# Patient Record
Sex: Female | Born: 1940 | Race: White | Hispanic: No | Smoking: Former smoker
Health system: Southern US, Community
[De-identification: ages and names within clinical notes are randomized; demographics above are authoritative.]

## PROBLEM LIST (undated history)

## (undated) DIAGNOSIS — Z96612 Presence of left artificial shoulder joint: Secondary | ICD-10-CM

## (undated) DIAGNOSIS — M109 Gout, unspecified: Secondary | ICD-10-CM

## (undated) DIAGNOSIS — F419 Anxiety disorder, unspecified: Secondary | ICD-10-CM

## (undated) DIAGNOSIS — D649 Anemia, unspecified: Secondary | ICD-10-CM

## (undated) DIAGNOSIS — J449 Chronic obstructive pulmonary disease, unspecified: Secondary | ICD-10-CM

## (undated) DIAGNOSIS — I480 Paroxysmal atrial fibrillation: Secondary | ICD-10-CM

## (undated) DIAGNOSIS — K219 Gastro-esophageal reflux disease without esophagitis: Secondary | ICD-10-CM

## (undated) DIAGNOSIS — M1990 Unspecified osteoarthritis, unspecified site: Secondary | ICD-10-CM

## (undated) DIAGNOSIS — Z9289 Personal history of other medical treatment: Secondary | ICD-10-CM

## (undated) DIAGNOSIS — N186 End stage renal disease: Secondary | ICD-10-CM

## (undated) DIAGNOSIS — J9600 Acute respiratory failure, unspecified whether with hypoxia or hypercapnia: Secondary | ICD-10-CM

## (undated) DIAGNOSIS — I4891 Unspecified atrial fibrillation: Secondary | ICD-10-CM

## (undated) DIAGNOSIS — E119 Type 2 diabetes mellitus without complications: Secondary | ICD-10-CM

## (undated) DIAGNOSIS — Z973 Presence of spectacles and contact lenses: Secondary | ICD-10-CM

## (undated) DIAGNOSIS — Z8639 Personal history of other endocrine, nutritional and metabolic disease: Secondary | ICD-10-CM

## (undated) DIAGNOSIS — N289 Disorder of kidney and ureter, unspecified: Secondary | ICD-10-CM

## (undated) DIAGNOSIS — J189 Pneumonia, unspecified organism: Secondary | ICD-10-CM

## (undated) DIAGNOSIS — Z972 Presence of dental prosthetic device (complete) (partial): Secondary | ICD-10-CM

## (undated) DIAGNOSIS — I10 Essential (primary) hypertension: Secondary | ICD-10-CM

## (undated) DIAGNOSIS — Z22322 Carrier or suspected carrier of Methicillin resistant Staphylococcus aureus: Secondary | ICD-10-CM

## (undated) DIAGNOSIS — E785 Hyperlipidemia, unspecified: Secondary | ICD-10-CM

## (undated) DIAGNOSIS — H9190 Unspecified hearing loss, unspecified ear: Secondary | ICD-10-CM

## (undated) DIAGNOSIS — E039 Hypothyroidism, unspecified: Secondary | ICD-10-CM

## (undated) DIAGNOSIS — F329 Major depressive disorder, single episode, unspecified: Secondary | ICD-10-CM

## (undated) DIAGNOSIS — F32A Depression, unspecified: Secondary | ICD-10-CM

## (undated) HISTORY — DX: Paroxysmal atrial fibrillation: I48.0

## (undated) HISTORY — PX: TUBAL LIGATION: SHX77

## (undated) HISTORY — PX: MULTIPLE TOOTH EXTRACTIONS: SHX2053

## (undated) HISTORY — PX: DILATION AND CURETTAGE OF UTERUS: SHX78

## (undated) HISTORY — PX: PORTACATH PLACEMENT: SHX2246

## (undated) HISTORY — PX: JOINT REPLACEMENT: SHX530

## (undated) HISTORY — DX: Personal history of other endocrine, nutritional and metabolic disease: Z86.39

## (undated) HISTORY — DX: Hyperlipidemia, unspecified: E78.5

## (undated) HISTORY — PX: PARTIAL HIP ARTHROPLASTY: SHX733

## (undated) HISTORY — PX: EYE SURGERY: SHX253

## (undated) HISTORY — PX: COLONOSCOPY W/ POLYPECTOMY: SHX1380

## (undated) HISTORY — PX: SHOULDER ARTHROSCOPY: SHX128

## (undated) HISTORY — PX: CARPAL TUNNEL RELEASE: SHX101

## (undated) HISTORY — DX: End stage renal disease: N18.6

## (undated) HISTORY — PX: OTHER SURGICAL HISTORY: SHX169

## (undated) HISTORY — PX: TOTAL KNEE ARTHROPLASTY: SHX125

---
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
## 2000-05-16 ENCOUNTER — Encounter: Admission: RE | Admit: 2000-05-16 | Discharge: 2000-05-16 | Payer: Self-pay

## 2000-09-19 ENCOUNTER — Encounter: Admission: RE | Admit: 2000-09-19 | Discharge: 2000-09-19 | Payer: Self-pay | Admitting: Internal Medicine

## 2000-12-06 ENCOUNTER — Encounter: Admission: RE | Admit: 2000-12-06 | Discharge: 2000-12-06 | Payer: Self-pay | Admitting: Internal Medicine

## 2001-01-09 ENCOUNTER — Encounter: Admission: RE | Admit: 2001-01-09 | Discharge: 2001-01-09 | Payer: Self-pay | Admitting: Obstetrics & Gynecology

## 2001-01-09 ENCOUNTER — Other Ambulatory Visit: Admission: RE | Admit: 2001-01-09 | Discharge: 2001-01-09 | Payer: Self-pay | Admitting: Obstetrics & Gynecology

## 2001-05-08 ENCOUNTER — Encounter: Admission: RE | Admit: 2001-05-08 | Discharge: 2001-05-08 | Payer: Self-pay | Admitting: Internal Medicine

## 2001-09-27 ENCOUNTER — Encounter: Admission: RE | Admit: 2001-09-27 | Discharge: 2001-09-27 | Payer: Self-pay | Admitting: Internal Medicine

## 2001-10-19 ENCOUNTER — Encounter: Admission: RE | Admit: 2001-10-19 | Discharge: 2001-10-19 | Payer: Self-pay | Admitting: Internal Medicine

## 2001-11-13 ENCOUNTER — Encounter: Admission: RE | Admit: 2001-11-13 | Discharge: 2001-11-13 | Payer: Self-pay | Admitting: Internal Medicine

## 2002-02-27 ENCOUNTER — Encounter: Admission: RE | Admit: 2002-02-27 | Discharge: 2002-02-27 | Payer: Self-pay | Admitting: Internal Medicine

## 2002-06-26 ENCOUNTER — Encounter: Admission: RE | Admit: 2002-06-26 | Discharge: 2002-06-26 | Payer: Self-pay | Admitting: Internal Medicine

## 2002-07-03 ENCOUNTER — Encounter: Admission: RE | Admit: 2002-07-03 | Discharge: 2002-07-03 | Payer: Self-pay | Admitting: Internal Medicine

## 2002-07-22 ENCOUNTER — Encounter: Admission: RE | Admit: 2002-07-22 | Discharge: 2002-07-22 | Payer: Self-pay | Admitting: Internal Medicine

## 2002-12-05 ENCOUNTER — Encounter: Admission: RE | Admit: 2002-12-05 | Discharge: 2002-12-05 | Payer: Self-pay | Admitting: Internal Medicine

## 2002-12-16 ENCOUNTER — Encounter: Admission: RE | Admit: 2002-12-16 | Discharge: 2002-12-16 | Payer: Self-pay | Admitting: Internal Medicine

## 2002-12-27 ENCOUNTER — Ambulatory Visit (HOSPITAL_COMMUNITY): Admission: RE | Admit: 2002-12-27 | Discharge: 2002-12-27 | Payer: Self-pay | Admitting: Internal Medicine

## 2002-12-30 ENCOUNTER — Encounter: Admission: RE | Admit: 2002-12-30 | Discharge: 2002-12-30 | Payer: Self-pay | Admitting: Internal Medicine

## 2003-01-27 ENCOUNTER — Ambulatory Visit (HOSPITAL_COMMUNITY): Admission: RE | Admit: 2003-01-27 | Discharge: 2003-01-27 | Payer: Self-pay | Admitting: Otolaryngology

## 2003-01-27 ENCOUNTER — Encounter: Payer: Self-pay | Admitting: Otolaryngology

## 2003-01-29 ENCOUNTER — Encounter: Admission: RE | Admit: 2003-01-29 | Discharge: 2003-01-29 | Payer: Self-pay | Admitting: Internal Medicine

## 2003-08-26 ENCOUNTER — Encounter: Admission: RE | Admit: 2003-08-26 | Discharge: 2003-08-26 | Payer: Self-pay | Admitting: Internal Medicine

## 2003-09-08 ENCOUNTER — Encounter: Admission: RE | Admit: 2003-09-08 | Discharge: 2003-09-08 | Payer: Self-pay | Admitting: Internal Medicine

## 2003-09-08 ENCOUNTER — Ambulatory Visit (HOSPITAL_COMMUNITY): Admission: RE | Admit: 2003-09-08 | Discharge: 2003-09-08 | Payer: Self-pay | Admitting: Internal Medicine

## 2003-09-22 ENCOUNTER — Encounter: Admission: RE | Admit: 2003-09-22 | Discharge: 2003-09-22 | Payer: Self-pay | Admitting: Internal Medicine

## 2004-02-18 ENCOUNTER — Ambulatory Visit: Payer: Self-pay | Admitting: Family Medicine

## 2004-04-13 ENCOUNTER — Ambulatory Visit: Payer: Self-pay | Admitting: Cardiology

## 2004-04-21 ENCOUNTER — Encounter: Admission: RE | Admit: 2004-04-21 | Discharge: 2004-07-20 | Payer: Self-pay | Admitting: Cardiology

## 2004-04-28 ENCOUNTER — Ambulatory Visit: Payer: Self-pay | Admitting: Family Medicine

## 2004-05-26 ENCOUNTER — Ambulatory Visit: Payer: Self-pay | Admitting: Family Medicine

## 2004-07-21 ENCOUNTER — Ambulatory Visit: Payer: Self-pay | Admitting: Family Medicine

## 2004-10-20 ENCOUNTER — Ambulatory Visit: Payer: Self-pay | Admitting: Family Medicine

## 2004-11-10 ENCOUNTER — Ambulatory Visit: Payer: Self-pay | Admitting: Family Medicine

## 2004-11-30 ENCOUNTER — Ambulatory Visit: Payer: Self-pay | Admitting: Family Medicine

## 2004-12-29 ENCOUNTER — Encounter: Admission: RE | Admit: 2004-12-29 | Discharge: 2004-12-29 | Payer: Self-pay | Admitting: Nephrology

## 2005-01-05 ENCOUNTER — Encounter: Admission: RE | Admit: 2005-01-05 | Discharge: 2005-01-05 | Payer: Self-pay | Admitting: Nephrology

## 2005-01-12 ENCOUNTER — Ambulatory Visit: Payer: Self-pay | Admitting: Family Medicine

## 2005-01-18 ENCOUNTER — Encounter: Admission: RE | Admit: 2005-01-18 | Discharge: 2005-01-18 | Payer: Self-pay | Admitting: Family Medicine

## 2005-02-09 ENCOUNTER — Ambulatory Visit: Payer: Self-pay | Admitting: Family Medicine

## 2005-04-10 ENCOUNTER — Emergency Department (HOSPITAL_COMMUNITY): Admission: EM | Admit: 2005-04-10 | Discharge: 2005-04-10 | Payer: Self-pay | Admitting: Emergency Medicine

## 2005-04-22 ENCOUNTER — Ambulatory Visit: Payer: Self-pay | Admitting: Cardiology

## 2005-05-17 ENCOUNTER — Ambulatory Visit: Payer: Self-pay | Admitting: Family Medicine

## 2005-07-13 ENCOUNTER — Encounter: Admission: RE | Admit: 2005-07-13 | Discharge: 2005-07-13 | Payer: Self-pay | Admitting: Nephrology

## 2005-08-16 ENCOUNTER — Ambulatory Visit: Payer: Self-pay | Admitting: Family Medicine

## 2005-11-16 ENCOUNTER — Ambulatory Visit: Payer: Self-pay | Admitting: Family Medicine

## 2006-05-31 ENCOUNTER — Ambulatory Visit: Payer: Self-pay | Admitting: Family Medicine

## 2006-07-25 ENCOUNTER — Encounter: Admission: RE | Admit: 2006-07-25 | Discharge: 2006-07-25 | Payer: Self-pay | Admitting: Nephrology

## 2006-07-31 ENCOUNTER — Ambulatory Visit: Payer: Self-pay | Admitting: Family Medicine

## 2006-09-18 ENCOUNTER — Ambulatory Visit: Payer: Self-pay | Admitting: Family Medicine

## 2007-02-23 ENCOUNTER — Encounter: Admission: RE | Admit: 2007-02-23 | Discharge: 2007-02-23 | Payer: Self-pay | Admitting: Nephrology

## 2007-11-06 ENCOUNTER — Inpatient Hospital Stay (HOSPITAL_COMMUNITY): Admission: RE | Admit: 2007-11-06 | Discharge: 2007-11-09 | Payer: Self-pay | Admitting: Orthopedic Surgery

## 2008-01-08 ENCOUNTER — Encounter: Admission: RE | Admit: 2008-01-08 | Discharge: 2008-03-12 | Payer: Self-pay | Admitting: Orthopedic Surgery

## 2008-04-15 ENCOUNTER — Inpatient Hospital Stay (HOSPITAL_COMMUNITY): Admission: RE | Admit: 2008-04-15 | Discharge: 2008-04-18 | Payer: Self-pay | Admitting: Orthopedic Surgery

## 2008-05-22 ENCOUNTER — Encounter: Admission: RE | Admit: 2008-05-22 | Discharge: 2008-08-20 | Payer: Self-pay | Admitting: Orthopedic Surgery

## 2008-08-21 ENCOUNTER — Emergency Department (HOSPITAL_COMMUNITY): Admission: EM | Admit: 2008-08-21 | Discharge: 2008-08-21 | Payer: Self-pay | Admitting: Emergency Medicine

## 2010-03-05 ENCOUNTER — Inpatient Hospital Stay (HOSPITAL_COMMUNITY)
Admission: RE | Admit: 2010-03-05 | Discharge: 2010-03-07 | Payer: Self-pay | Source: Home / Self Care | Admitting: Orthopedic Surgery

## 2010-04-20 ENCOUNTER — Inpatient Hospital Stay (HOSPITAL_COMMUNITY)
Admission: EM | Admit: 2010-04-20 | Discharge: 2010-04-23 | Payer: Self-pay | Source: Home / Self Care | Attending: Orthopedic Surgery | Admitting: Orthopedic Surgery

## 2010-04-23 NOTE — H&P (Signed)
[name], ECCLESTON NO.:  [id number]  MEDICAL RECORD NO.:  [id number]           PATIENT TYPE:  LOCATION:                                 FACILITY:  PHYSICIAN:  Dahlia Bailiff, MD    DATE OF BIRTH:  11/18/1940  DATE OF ADMISSION:  04/21/2010 DATE OF DISCHARGE:                             HISTORY & PHYSICAL   ADMITTING DIAGNOSIS:  Right shoulder periprosthetic fracture.  HISTORY:  This is a very pleasant 70 year old female who 2 weeks ago underwent a reversed right total shoulder replacement by Dr. Esmond Plants.  The surgical date was March 06, 2010 and the patient was ultimately discharged to home on March 10, 2010 in no acute distress. The patient was scheduled to return to see Dr. Veverly Fells today. Unfortunately she was at home yesterday and fell.  In the fall from a standing height, there was no loss of consciousness, no blurry vision, nausea, or headaches.  The patient presented to the emergency room yesterday and was notified early this morning about the fall.  X-rays in the emergency room demonstrated periprosthetic fracture and so a CT scan was ordered and the decision was made to admit the patient for ongoing pain management.  The patient's past medical, surgical, family, social history includes hypertension, hypothyroidism, hyperlipidemia, diabetes.  She has a family history of hypertension.  Patient of Dr. Edrick Oh.  She does not smoke or use alcohol.  She is allergic to SULFA medication.  She is currently taking Robaxin, Zetia, Tylenol, Synthroid, sodium bicarbonate, Vicodin, colchicine, carvedilol, aspirin, and amlodipine.  CLINICAL EXAM:  She is alert and oriented x3.  She has complaints of significant right shoulder pain.  There was bruising and ecchymosis. The surgical incision site is clean, dry, and intact.  There is no wound dehiscence.  There is no obvious laceration, abrasion, or contusion. She has no significant neck pain.  She does  have left-sided parietal scalp hematoma but no active bleeding.  She has no shortness of breath, chest pain.  The abdomen is soft and nontender.  She is able to ambulate with assistance.  CT scan of the neck and head demonstrate a left scalp hematoma with no underlying skull fracture.  Cervical spine demonstrates no acute cervical fracture.  CT scan and x-rays of the right shoulder demonstrate a oblique fracture that __________ around the prosthesis.  There was about 1 cm of displacement approximately.  The fracture extended distally to the tip of the prosthesis about 1 cm.  There was no significant displacement of the fracture itself.  At this point in time, the patient is neurovascularly intact and has closed periprosthetic fracture.  The incision site is intact.  At this point, I have spoken with Dr. Veverly Fells, her treating surgeon.  We will continue to admit her for pain control.  He does not think revision surgery is needed.  We will keep her in sling and he will reevaluate her this evening or first thing in the morning.     Dahlia Bailiff, MD     DDB/MEDQ  D:  04/21/2010  T:  04/21/2010  Job:  [id number]  Electronically Signed by Melina Schools MD on 04/22/2010 08:47:12 PM

## 2010-04-26 LAB — CBC
HCT: 33.9 % — ABNORMAL LOW (ref 36.0–46.0)
Hemoglobin: 11 g/dL — ABNORMAL LOW (ref 12.0–15.0)
MCH: 29.6 pg (ref 26.0–34.0)
MCHC: 32.4 g/dL (ref 30.0–36.0)
MCV: 91.4 fL (ref 78.0–100.0)
Platelets: 218 10*3/uL (ref 150–400)
RBC: 3.71 MIL/uL — ABNORMAL LOW (ref 3.87–5.11)
RDW: 13.5 % (ref 11.5–15.5)
WBC: 9.3 10*3/uL (ref 4.0–10.5)

## 2010-04-26 LAB — GLUCOSE, CAPILLARY
Glucose-Capillary: 86 mg/dL (ref 70–99)
Glucose-Capillary: 102 mg/dL — ABNORMAL HIGH (ref 70–99)
Glucose-Capillary: 145 mg/dL — ABNORMAL HIGH (ref 70–99)
Glucose-Capillary: 137 mg/dL — ABNORMAL HIGH (ref 70–99)
Glucose-Capillary: 92 mg/dL (ref 70–99)
Glucose-Capillary: 107 mg/dL — ABNORMAL HIGH (ref 70–99)

## 2010-04-26 LAB — BASIC METABOLIC PANEL
BUN: 43 mg/dL — ABNORMAL HIGH (ref 6–23)
CO2: 22 mEq/L (ref 19–32)
Calcium: 9.3 mg/dL (ref 8.4–10.5)
Chloride: 112 mEq/L (ref 96–112)
Creatinine, Ser: 3.09 mg/dL — ABNORMAL HIGH (ref 0.4–1.2)
GFR calc Af Amer: 18 mL/min — ABNORMAL LOW (ref >60)
GFR calc non Af Amer: 15 mL/min — ABNORMAL LOW (ref >60)
Glucose, Bld: 101 mg/dL — ABNORMAL HIGH (ref 70–99)
Potassium: 5.1 mEq/L (ref 3.5–5.1)
Sodium: 142 mEq/L (ref 135–145)

## 2010-04-26 LAB — APTT: aPTT: 33 seconds (ref 24–37)

## 2010-04-26 LAB — DIFFERENTIAL
Basophils Absolute: 0.1 10*3/uL (ref 0.0–0.1)
Basophils Relative: 1 % (ref 0–1)
Eosinophils Absolute: 0.4 10*3/uL (ref 0.0–0.7)
Eosinophils Relative: 4 % (ref 0–5)
Lymphocytes Relative: 24 % (ref 12–46)
Lymphs Abs: 2.2 10*3/uL (ref 0.7–4.0)
Monocytes Absolute: 0.8 10*3/uL (ref 0.1–1.0)
Monocytes Relative: 8 % (ref 3–12)
Neutro Abs: 5.8 10*3/uL (ref 1.7–7.7)
Neutrophils Relative %: 63 % (ref 43–77)

## 2010-04-26 LAB — PROTIME-INR
INR: 1.03 (ref 0.00–1.49)
Prothrombin Time: 13.7 seconds (ref 11.6–15.2)

## 2010-06-15 LAB — URINALYSIS, ROUTINE W REFLEX MICROSCOPIC
Bilirubin Urine: NEGATIVE
Glucose, UA: NEGATIVE mg/dL
Ketones, ur: NEGATIVE mg/dL
Nitrite: POSITIVE — AB
Protein, ur: 30 mg/dL — AB
Specific Gravity, Urine: 1.015 (ref 1.005–1.030)
Urobilinogen, UA: 0.2 mg/dL (ref 0.0–1.0)
pH: 6.5 (ref 5.0–8.0)

## 2010-06-15 LAB — BASIC METABOLIC PANEL
BUN: 32 mg/dL — ABNORMAL HIGH (ref 6–23)
BUN: 32 mg/dL — ABNORMAL HIGH (ref 6–23)
BUN: 36 mg/dL — ABNORMAL HIGH (ref 6–23)
CO2: 20 mEq/L (ref 19–32)
CO2: 22 mEq/L (ref 19–32)
CO2: 23 mEq/L (ref 19–32)
Calcium: 8.3 mg/dL — ABNORMAL LOW (ref 8.4–10.5)
Calcium: 8.4 mg/dL (ref 8.4–10.5)
Calcium: 9.3 mg/dL (ref 8.4–10.5)
Chloride: 110 mEq/L (ref 96–112)
Chloride: 112 mEq/L (ref 96–112)
Chloride: 114 mEq/L — ABNORMAL HIGH (ref 96–112)
Creatinine, Ser: 2.72 mg/dL — ABNORMAL HIGH (ref 0.4–1.2)
Creatinine, Ser: 2.77 mg/dL — ABNORMAL HIGH (ref 0.4–1.2)
Creatinine, Ser: 3.1 mg/dL — ABNORMAL HIGH (ref 0.4–1.2)
GFR calc Af Amer: 18 mL/min — ABNORMAL LOW (ref >60)
GFR calc Af Amer: 21 mL/min — ABNORMAL LOW (ref >60)
GFR calc Af Amer: 21 mL/min — ABNORMAL LOW (ref >60)
GFR calc non Af Amer: 15 mL/min — ABNORMAL LOW (ref >60)
GFR calc non Af Amer: 17 mL/min — ABNORMAL LOW (ref >60)
GFR calc non Af Amer: 17 mL/min — ABNORMAL LOW (ref >60)
Glucose, Bld: 122 mg/dL — ABNORMAL HIGH (ref 70–99)
Glucose, Bld: 132 mg/dL — ABNORMAL HIGH (ref 70–99)
Glucose, Bld: 96 mg/dL (ref 70–99)
Potassium: 4.5 mEq/L (ref 3.5–5.1)
Potassium: 4.9 mEq/L (ref 3.5–5.1)
Potassium: 5.4 mEq/L — ABNORMAL HIGH (ref 3.5–5.1)
Sodium: 137 mEq/L (ref 135–145)
Sodium: 140 mEq/L (ref 135–145)
Sodium: 141 mEq/L (ref 135–145)

## 2010-06-15 LAB — CBC
HCT: 26.4 % — ABNORMAL LOW (ref 36.0–46.0)
HCT: 29.2 % — ABNORMAL LOW (ref 36.0–46.0)
HCT: 40.3 % (ref 36.0–46.0)
Hemoglobin: 13.1 g/dL (ref 12.0–15.0)
Hemoglobin: 8.7 g/dL — ABNORMAL LOW (ref 12.0–15.0)
Hemoglobin: 9.4 g/dL — ABNORMAL LOW (ref 12.0–15.0)
MCH: 29.9 pg (ref 26.0–34.0)
MCH: 29.9 pg (ref 26.0–34.0)
MCH: 30.5 pg (ref 26.0–34.0)
MCHC: 32.2 g/dL (ref 30.0–36.0)
MCHC: 32.5 g/dL (ref 30.0–36.0)
MCHC: 33 g/dL (ref 30.0–36.0)
MCV: 92 fL (ref 78.0–100.0)
MCV: 92.6 fL (ref 78.0–100.0)
MCV: 93 fL (ref 78.0–100.0)
Platelets: 143 10*3/uL — ABNORMAL LOW (ref 150–400)
Platelets: 154 10*3/uL (ref 150–400)
Platelets: 234 10*3/uL (ref 150–400)
RBC: 2.85 MIL/uL — ABNORMAL LOW (ref 3.87–5.11)
RBC: 3.14 MIL/uL — ABNORMAL LOW (ref 3.87–5.11)
RBC: 4.38 MIL/uL (ref 3.87–5.11)
RDW: 13.9 % (ref 11.5–15.5)
RDW: 14.1 % (ref 11.5–15.5)
RDW: 14.2 % (ref 11.5–15.5)
WBC: 6.6 10*3/uL (ref 4.0–10.5)
WBC: 7.3 10*3/uL (ref 4.0–10.5)
WBC: 7.8 10*3/uL (ref 4.0–10.5)

## 2010-06-15 LAB — TYPE AND SCREEN
ABO/RH(D): O POS
Antibody Screen: NEGATIVE

## 2010-06-15 LAB — GLUCOSE, CAPILLARY
Glucose-Capillary: 114 mg/dL — ABNORMAL HIGH (ref 70–99)
Glucose-Capillary: 125 mg/dL — ABNORMAL HIGH (ref 70–99)
Glucose-Capillary: 139 mg/dL — ABNORMAL HIGH (ref 70–99)
Glucose-Capillary: 144 mg/dL — ABNORMAL HIGH (ref 70–99)
Glucose-Capillary: 155 mg/dL — ABNORMAL HIGH (ref 70–99)
Glucose-Capillary: 156 mg/dL — ABNORMAL HIGH (ref 70–99)
Glucose-Capillary: 159 mg/dL — ABNORMAL HIGH (ref 70–99)
Glucose-Capillary: 91 mg/dL (ref 70–99)

## 2010-06-15 LAB — DIFFERENTIAL
Basophils Absolute: 0.1 10*3/uL (ref 0.0–0.1)
Basophils Relative: 1 % (ref 0–1)
Eosinophils Absolute: 0.7 10*3/uL (ref 0.0–0.7)
Eosinophils Relative: 9 % — ABNORMAL HIGH (ref 0–5)
Lymphocytes Relative: 31 % (ref 12–46)
Lymphs Abs: 2.3 10*3/uL (ref 0.7–4.0)
Monocytes Absolute: 0.4 10*3/uL (ref 0.1–1.0)
Monocytes Relative: 6 % (ref 3–12)
Neutro Abs: 3.9 10*3/uL (ref 1.7–7.7)
Neutrophils Relative %: 53 % (ref 43–77)

## 2010-06-15 LAB — URINE MICROSCOPIC-ADD ON

## 2010-06-15 LAB — APTT: aPTT: 31 seconds (ref 24–37)

## 2010-06-15 LAB — PROTIME-INR
INR: 1.07 (ref 0.00–1.49)
Prothrombin Time: 14.1 seconds (ref 11.6–15.2)

## 2010-06-15 LAB — SURGICAL PCR SCREEN
MRSA, PCR: NEGATIVE
Staphylococcus aureus: NEGATIVE

## 2010-06-15 LAB — ABO/RH: ABO/RH(D): O POS

## 2010-07-19 LAB — BASIC METABOLIC PANEL
BUN: 30 mg/dL — ABNORMAL HIGH (ref 6–23)
BUN: 36 mg/dL — ABNORMAL HIGH (ref 6–23)
BUN: 37 mg/dL — ABNORMAL HIGH (ref 6–23)
BUN: 38 mg/dL — ABNORMAL HIGH (ref 6–23)
CO2: 21 mEq/L (ref 19–32)
CO2: 21 mEq/L (ref 19–32)
CO2: 22 mEq/L (ref 19–32)
CO2: 24 mEq/L (ref 19–32)
Calcium: 8.5 mg/dL (ref 8.4–10.5)
Calcium: 8.5 mg/dL (ref 8.4–10.5)
Calcium: 9.2 mg/dL (ref 8.4–10.5)
Calcium: 9.7 mg/dL (ref 8.4–10.5)
Chloride: 109 mEq/L (ref 96–112)
Chloride: 111 mEq/L (ref 96–112)
Chloride: 111 mEq/L (ref 96–112)
Chloride: 112 mEq/L (ref 96–112)
Creatinine, Ser: 2.53 mg/dL — ABNORMAL HIGH (ref 0.4–1.2)
Creatinine, Ser: 2.54 mg/dL — ABNORMAL HIGH (ref 0.4–1.2)
Creatinine, Ser: 3 mg/dL — ABNORMAL HIGH (ref 0.4–1.2)
Creatinine, Ser: 3.15 mg/dL — ABNORMAL HIGH (ref 0.4–1.2)
GFR calc Af Amer: 18 mL/min — ABNORMAL LOW (ref >60)
GFR calc Af Amer: 19 mL/min — ABNORMAL LOW (ref >60)
GFR calc Af Amer: 23 mL/min — ABNORMAL LOW (ref >60)
GFR calc Af Amer: 23 mL/min — ABNORMAL LOW (ref >60)
GFR calc non Af Amer: 15 mL/min — ABNORMAL LOW (ref >60)
GFR calc non Af Amer: 16 mL/min — ABNORMAL LOW (ref >60)
GFR calc non Af Amer: 19 mL/min — ABNORMAL LOW (ref >60)
GFR calc non Af Amer: 19 mL/min — ABNORMAL LOW (ref >60)
Glucose, Bld: 141 mg/dL — ABNORMAL HIGH (ref 70–99)
Glucose, Bld: 142 mg/dL — ABNORMAL HIGH (ref 70–99)
Glucose, Bld: 95 mg/dL (ref 70–99)
Glucose, Bld: 98 mg/dL (ref 70–99)
Potassium: 4.4 mEq/L (ref 3.5–5.1)
Potassium: 4.6 mEq/L (ref 3.5–5.1)
Potassium: 4.9 mEq/L (ref 3.5–5.1)
Potassium: 5 mEq/L (ref 3.5–5.1)
Sodium: 139 mEq/L (ref 135–145)
Sodium: 140 mEq/L (ref 135–145)
Sodium: 140 mEq/L (ref 135–145)
Sodium: 141 mEq/L (ref 135–145)

## 2010-07-19 LAB — DIFFERENTIAL
Basophils Absolute: 0 10*3/uL (ref 0.0–0.1)
Basophils Relative: 1 % (ref 0–1)
Eosinophils Absolute: 0.5 10*3/uL (ref 0.0–0.7)
Eosinophils Relative: 7 % — ABNORMAL HIGH (ref 0–5)
Lymphocytes Relative: 23 % (ref 12–46)
Lymphs Abs: 1.8 10*3/uL (ref 0.7–4.0)
Monocytes Absolute: 0.5 10*3/uL (ref 0.1–1.0)
Monocytes Relative: 7 % (ref 3–12)
Neutro Abs: 4.7 10*3/uL (ref 1.7–7.7)
Neutrophils Relative %: 63 % (ref 43–77)

## 2010-07-19 LAB — GLUCOSE, CAPILLARY
Glucose-Capillary: 109 mg/dL — ABNORMAL HIGH (ref 70–99)
Glucose-Capillary: 117 mg/dL — ABNORMAL HIGH (ref 70–99)

## 2010-07-19 LAB — TYPE AND SCREEN
ABO/RH(D): O POS
Antibody Screen: NEGATIVE

## 2010-07-19 LAB — CBC
HCT: 27.3 % — ABNORMAL LOW (ref 36.0–46.0)
HCT: 33.2 % — ABNORMAL LOW (ref 36.0–46.0)
HCT: 38.3 % (ref 36.0–46.0)
Hemoglobin: 11.1 g/dL — ABNORMAL LOW (ref 12.0–15.0)
Hemoglobin: 12.6 g/dL (ref 12.0–15.0)
Hemoglobin: 9.2 g/dL — ABNORMAL LOW (ref 12.0–15.0)
MCHC: 33 g/dL (ref 30.0–36.0)
MCHC: 33.4 g/dL (ref 30.0–36.0)
MCHC: 33.5 g/dL (ref 30.0–36.0)
MCV: 91.9 fL (ref 78.0–100.0)
MCV: 93.4 fL (ref 78.0–100.0)
MCV: 93.4 fL (ref 78.0–100.0)
Platelets: 169 10*3/uL (ref 150–400)
Platelets: 193 10*3/uL (ref 150–400)
Platelets: 246 10*3/uL (ref 150–400)
RBC: 2.92 MIL/uL — ABNORMAL LOW (ref 3.87–5.11)
RBC: 3.62 MIL/uL — ABNORMAL LOW (ref 3.87–5.11)
RBC: 4.1 MIL/uL (ref 3.87–5.11)
RDW: 15.4 % (ref 11.5–15.5)
RDW: 15.5 % (ref 11.5–15.5)
RDW: 16 % — ABNORMAL HIGH (ref 11.5–15.5)
WBC: 10 10*3/uL (ref 4.0–10.5)
WBC: 7.5 10*3/uL (ref 4.0–10.5)
WBC: 9.5 10*3/uL (ref 4.0–10.5)

## 2010-07-19 LAB — URINALYSIS, ROUTINE W REFLEX MICROSCOPIC
Bilirubin Urine: NEGATIVE
Glucose, UA: NEGATIVE mg/dL
Ketones, ur: NEGATIVE mg/dL
Nitrite: POSITIVE — AB
Protein, ur: 30 mg/dL — AB
Specific Gravity, Urine: 1.016 (ref 1.005–1.030)
Urobilinogen, UA: 0.2 mg/dL (ref 0.0–1.0)
pH: 5.5 (ref 5.0–8.0)

## 2010-07-19 LAB — PREPARE RBC (CROSSMATCH)

## 2010-07-19 LAB — URINE MICROSCOPIC-ADD ON

## 2010-07-19 LAB — APTT: aPTT: 26 seconds (ref 24–37)

## 2010-07-19 LAB — PROTIME-INR
INR: 0.9 (ref 0.00–1.49)
Prothrombin Time: 12.6 seconds (ref 11.6–15.2)

## 2010-08-13 NOTE — Discharge Summary (Signed)
  [name], [name] NO.:  [id number]  MEDICAL RECORD NO.:  [id number]          PATIENT TYPE:  INP  LOCATION:  5018                         FACILITY:  [facility]  PHYSICIAN:  Doran Heater. Veverly Fells, M.D. DATE OF BIRTH:  1940/12/24  DATE OF ADMISSION:  04/20/2010 DATE OF DISCHARGE:  04/23/2010                              DISCHARGE SUMMARY   ADMITTING DIAGNOSIS:  Right periprosthetic humerus fracture as well as head contusion.  POSTOPERATIVE DIAGNOSIS:  Right periprosthetic humerus fracture as well as head contusion.  CONSULTING SERVICES:  Physical Therapy, Occupational Therapy and discharge planning.  HISTORY OF PRESENT ILLNESS:  The patient is a 70 year old female who is status post right reverse total shoulder arthroplasty.  The patient is approximately 6-8 weeks postop when she suffered a fall, striking her head and injuring her right arm.  The patient suffered a minimally displaced periprosthetic right proximal humerus fracture and is admitted for further workup and care for her contused head and also for right shoulder.  For further details of the patient's past medical history and physical examination, please see the hospital record.  HOSPITAL COURSE:  The patient admitted by Orthopedics by Dr. Melina Schools on April 21, 2010, with a diagnosis of a head contusion as well as a right proximal humerus periprosthetic fracture.  The patient had a CT scan performed of the head and the C-spine there was no acute fracture.  She had a proximal humerus fracture by CT.  This was verified.  There was no prosthetic loosening and it was felt that this was a stable injury.  The patient had physical therapy and occupational therapy.  She will be getting home health physical therapy with sister. She did have some itching with Percocet.  We switched her over to Dilaudid and Norco.  The patient was discharged on April 23, 2010, in an improved condition with a regular diet,  her preadmission medications as well as Dilaudid and Norco and Robaxin.  She will be kept in an arm sling with no exercises for the shoulder just gentle mobilization and I will be seeing her back in 1 week in the office for x-rays.     Doran Heater. Veverly Fells, M.D.     SRN/MEDQ  D:  07/28/2010  T:  07/29/2010  Job:  [id number]  Electronically Signed by Esmond Plants  on 08/13/2010 04:27:53 PM

## 2010-08-17 NOTE — H&P (Signed)
NAMEMARJA, Roman NO.:  [id number]   MEDICAL RECORD NO.:  [id number]        PATIENT TYPE:  LINP   LOCATION:                               FACILITY:  [facility]   PHYSICIAN:  Pietro Cassis. Alvan Dame, M.D.  DATE OF BIRTH:  1940-11-10   DATE OF ADMISSION:  11/06/2007  DATE OF DISCHARGE:                              HISTORY & PHYSICAL   PROCEDURE:  Left total knee arthroplasty.   CHIEF COMPLAINT:  Left knee pain.   HISTORY OF PRESENT ILLNESS:  A 70 year old female with a history of left  knee pain secondary to osteoarthritis.  It has been refractory to all  conservative treatment including oral antiinflammatories, cortisone  injection and viscosupplementation.  She has pain with all activities,  pain at night and difficulty sleeping.  Pre-surgically assessed by  [name], M.D. who is her primary care physician.   PAST MEDICAL HISTORY:  Significant for:  1. Osteoarthritis.  2. Gout.  3. Diabetes.  4. History of renal insufficiency.   PAST SURGICAL HISTORY:  None.   FAMILY HISTORY:  Heart disease.   SOCIAL HISTORY:  Married.  Primary caregiver will be family in the home  including her sister, Hildred Priest.   DRUG ALLERGIES:  SULFA DRUGS.   MEDICATIONS:  1. Amlodipine 10 mg p.o. daily.  2. Colchicine 0.6 mg p.r.n.  3. Zetia 10 mg p.o. daily.  4. Synthroid 100 mcg p.o. daily.  5. Aspirin 81 mg p.o. daily.  6. Sodium bicarbonate 650 mg p.o. daily.  7. Carvedilol 6.25 mg p.o. daily.   Please verify all medicines, dosage and frequency with the patient at  the time of admission.   REVIEW OF SYSTEMS:  GENITOURINARY:  She has increased urination at  night. MUSCULOSKELETAL: Multiple joint pains.  Otherwise see HPI.   PHYSICAL EXAMINATION:  Pulse 64, respirations 16, blood pressure 140/98,  5 feet, 5, 225 pounds.  GENERAL:  Awake, alert and oriented, well-developed, well-nourished.  NECK:  Supple.  No carotid bruits.  CHEST:  Lungs are clear to  auscultation bilaterally.  BREASTS:  Deferred.  HEART:  Regular rate and rhythm.  S1 and S2 distinct.  ABDOMEN:  Soft, nontender, and nondistended.  Bowel sounds present.  GENITOURINARY:  Deferred.  EXTREMITIES:  Left knee:  0-100 degrees.  Dorsalis pedis pulse positive.  SKIN:  Intact.  No cellulitis.  NEUROLOGIC:  Intact.  Distal sensibilities.   LABORATORY DATA:  EKG and chest x-ray are pending procedural testing.   IMPRESSION:  Left knee osteoarthritis.   PLAN OF ACTION:  1. Left total knee arthroplasty at [facility], November 06, 2007 by surgeon Pietro Cassis. Alvan Dame, M.D.  The risks and complications      were discussed.  2. She will need careful management with fluids do to her chronic      renal insufficiency.  3. The planned disposition is going to be skilled nursing facility      rehab.  The patient has already scouted out for some facilities and      pending bed availability  will provide a name at time of admission.     ______________________________  Carlean Jews Collene Mares, [place]      Pietro Cassis. Alvan Dame, M.D.  Electronically Signed    BLM/MEDQ  D:  10/25/2007  T:  10/25/2007  Job:  [id number]   cc:   [name], M.D.  Fax: [phone]

## 2010-08-17 NOTE — H&P (Signed)
[name], [name] NO.:  [id number]   MEDICAL RECORD NO.:  [id number]          PATIENT TYPE:  INP   LOCATION:                               FACILITY:  [facility]   PHYSICIAN:  Pietro Cassis. Alvan Dame, M.D.  DATE OF BIRTH:  1940-12-29   DATE OF ADMISSION:  04/15/2008  DATE OF DISCHARGE:                              HISTORY & PHYSICAL   DATE OF ADMISSION:  April 15, 2008.   ATTENDING PHYSICIAN:  Pietro Cassis. Alvan Dame, M.D.   PROCEDURE:  Right total knee replacement.   CHIEF COMPLAINT:  Right knee pain.   HISTORY OF PRESENT ILLNESS:  [age]-year-old female with a history  of right knee pain secondary to osteoarthritis.  It has been refractory  to all conservative treatment.  She does have a history of recent left  total knee replacement back in August of 2009 and has done well with  this.  She has been pre-surgically assessed by her physician, [name], M.D.   PRIMARY CARE PHYSICIAN:  [name], M.D.   PAST MEDICAL HISTORY:  1. Osteoarthritis.  2. Gout.  3. Diabetes.  4. History of renal insufficiency.   PAST SURGICAL HISTORY:  Left total knee replacement August 2009.   FAMILY HISTORY:  Heart disease.   SOCIAL HISTORY:  Married.  She is planning on a skilled nursing facility  stay postoperatively, [facility] in [place].   DRUG ALLERGIES:  SULFA DRUGS.   MEDICATIONS:  1. Amlodipine 5 mg daily.  2. Colchicine 0.6 mg daily p.r.n.  3. Zetia 10 mg daily.  4. Synthroid 100 mcg  p.o. daily.  5. Aspirin 81 mg daily.  6. Sodium bicarb 650 mg p.o. daily.  7. Carvedilol 6.25 mg p.o. daily.  8. Vitamin D monthly.  9. Darvocet N 100 one to two every 8 p.r.n. pain.   REVIEW OF SYSTEMS:  GENITOURINARY:  Increased urination at night.  MUSCULOSKELETAL:  Multiple joint pains.  Otherwise see HPI.   PHYSICAL EXAMINATION:  Pulse 72, respirations 16, blood pressure 158/90.  GENERAL:  Awake, alert and oriented, uses a cane.  NECK:  Supple.  No  carotid bruits.  CHEST:  Lungs clear to auscultation bilaterally.  BREASTS:  Deferred.  HEART:  Regular rate and rhythm.  S1, S2 distinct.  ABDOMEN:  Obese.  Bowel sounds present.  PELVIC:  Stable.  GENITOURINARY:  Deferred.  EXTREMITIES:  Right knee range of motion 0-95 with diffuse anterior  tenderness.  SKIN:  No cellulitis of her right lower extremity.  NEUROLOGIC:  Intact distal sensibilities.   LABORATORY DATA:  Labs, EKG, chest x-ray pending.   IMPRESSION:  Right knee osteoarthritis.   PLAN OF ACTION:  Right total knee replacement on April 15, 2008 at  [facility] by surgeon Dr. Paralee Cancel.  Risks and  complications were discussed.  She is planning on postoperative  rehabilitation stay at [facility] in [place].     ______________________________  Carlean Jews Collene Mares, [place]      Pietro Cassis. Alvan Dame, M.D.  Electronically Signed    BLM/MEDQ  D:  04/09/2008  T:  04/09/2008  Job:  [id number]   cc:   [name], M.D.  Fax: [phone]

## 2010-08-17 NOTE — Op Note (Signed)
NAMEMAKAI, CAPPA NO.:  [id number]   MEDICAL RECORD NO.:  [id number]          PATIENT TYPE:  INP   LOCATION:  0008                         FACILITY:  [facility]   PHYSICIAN:  Pietro Cassis. Alvan Dame, M.D.  DATE OF BIRTH:  [birth year]   DATE OF PROCEDURE:  11/06/2007  DATE OF DISCHARGE:                               OPERATIVE REPORT   PREOPERATIVE DIAGNOSIS:  Left knee osteoarthritis with significant  valgus deformity but correctable passively with no flexion deformity.   POSTOPERATIVE DIAGNOSIS:  Left knee osteoarthritis with significant  valgus deformity but correctable passively with no flexion deformity.   PROCEDURE:  Left total knee replacement utilizing DePuy rotating  platform, posterior stabilized knee system, size 2 femur, 2.5  tibia and  a 15-mm insert, 35 patellar button.   SURGEON:  Pietro Cassis. Alvan Dame, M.D.   ASSISTANT:  Carlean Jews. Collene Mares, PA   ANESTHESIA:  Duramorph spinal.   DRAINS:  One.   COMPLICATION:  None.   TOURNIQUET TIME:  45 minutes at 250 mmHg.   INDICATIONS FOR PROCEDURE:  Ms. Spelman is a 70 year old female who  presented to the office for bilateral knee pain left greater than right.  Radiographs revealed end-stage changes bilaterally. Failing conservative  measures, she wished to proceed with arthroplasty, reviewed the risks  and benefits of the procedures including infection, DVT, component  failure was well as postop course expectations, pain relief as well as  the need for range of motion and physical therapy for strength.   Consent was obtained.   PROCEDURE IN DETAIL:  The patient was brought to the operating theater.  Once adequate anesthesia and preoperative antibiotics administered, the  patient was positioned supine and a tourniquet placed.  The left lower  extremity pre scrubbed and prepped and draped in sterile fashion.  The  midline incision was made followed by median arthrotomy as I was able to  passivley correct her knee and I  did not feel like I needed a lateral  exposure.  Following routine exposure, attention was directed to the  patella.  Precut measure was 22 mm.  I resected down to 13 to 14 mm  using a 35 patellar button.  Restoring the patella height, attention was  now directed to the femur.  She was noted to have significant lateral  osteophytes and moderate to severe medial osteophytes.   I had to debride some of these in order to subluxate the patella  laterally.  A starting drill opened up the femoral canal.  I irrigated  it to prevent fat emboli.  I then placed an intramedullary rod in 5  degrees of valgus, resected 10 mm of bone off the distal femur which did  amount followup a couple mm cut laterally.  No augments necessary.  The  tibia was now subluxated anteriorly and noted severe degenerative  changes laterally.  I chose this point with the extramedullary guide to  resect 2 mm of bone off the lateral side, the affected side.  Following  this resection I checked with a 2.5 tibial tray that the cut was  perpendicular and it was.  Given this, I set my rotation for the femoral  cutting block based off the cut surface the tibia.  Once this was sized  and pinned, a size 2 cutting block was positioned, I checked with a crab  claw to make sure there was no notching.  Anterior, posterior and  chamfer cuts were then made without difficulty or complication.  Note  that the rotation was set by the line perpendicular to [employer].  Final box cut was made based off the lateral aspect of distal femur.  Further osteophytes debrided as necessary further releasing and  debriding and releasing the lateral collateral ligament from stress.   At this point the tibia was subluxated anterior.  The final preparation  of the tibia was carried out with a 2.5 tibial tray.  It was pinned into  position.  The medial third of the tubercle was drilled and keel  punched.  Trial reduction was now carried out.  With a  trial reduction I  basically found that a size 15 insert was best to eliminate any  hyperextension and provide stability for extension and flexion.  The  range of motion was very well tolerated otherwise.  The patella did  track without any application of pressure symmetrically through the  trochlea.   At this point all trial components removed.  I did use laminar spreaders  and debrided the posterior aspect of the knee of any bony debris and  osteophytes.  I injected the synovial capsule layer was 60 mL of 0.25%  Marcaine with epinephrine 1 mL of Toradol.  I gently cleaned out the  knee with normal saline solution pulse lavage.   The final components were opened and cement mixed.  The final components  were cemented in position.  The knee was brought to extension with 12.5  insert.  The extruded cement was removed.  Once cement cured, excessive  cement was removed throughout the knee.  Once I was satisfied that there  was no remaining cement, the final I chose a 15 insert due to stability.  Final 15 insert was placed.  The knee was reirrigated.  The medium  Hemovac drain was placed deep.  The tourniquet was let down at 45  minutes.  I then reapproximated extensor mechanism with #1 Vicryl with  the knee in flexion.  The remaining wound was closed with 2-0 Vicryl and  running 4-0 Monocryl.  The knee was cleaned, dried, dressed sterilely  with sterile bulky Jones dressing.  She was brought to the recovery room  tolerating the procedure well.      Pietro Cassis Alvan Dame, M.D.  Electronically Signed     MDO/MEDQ  D:  11/06/2007  T:  11/06/2007  Job:  [id number]

## 2010-08-17 NOTE — Discharge Summary (Signed)
[name], GREGOREK NO.:  [id number]   MEDICAL RECORD NO.:  [id number]          PATIENT TYPE:  INP   LOCATION:  [place]                         FACILITY[facility]  [facility]   PHYSICIAN:  Pietro Cassis. Alvan Dame, M.D.  DATE OF BIRTH:  [birth year]   DATE OF ADMISSION:  04/15/2008  DATE OF DISCHARGE:  04/18/2008                               DISCHARGE SUMMARY   ADMISSION DIAGNOSES:  1. Osteoarthritis.  2. Gout.  3. Diabetes.  4. History of renal insufficiency.   DISCHARGE DIAGNOSES:  1. Osteoarthritis.  2. Gout.  3. Diabetes.  4. History of renal insufficiency.  5. Postoperative anemia, resolved, with transfusion.  6. Osteopenia.   HISTORY OF PRESENT ILLNESS:  [name] is a 70 year old female with a  history of right knee osteoarthritis, who underwent a right total knee  replacement in the hospital.   CONSULTS:  None.   PROCEDURE:  Right total knee replacement by surgeon, Dr. Paralee Cancel.  Assistant:  Rowan Blase, PA-C.   LABORATORY DATA:  CBC, final reading:  White blood cells 10, hemoglobin  11.1, hematocrit 33.2, platelets 169.  Metabolic panel:  Sodium [id number],  potassium 5, BUN 38, creatinine 3, glucose 142.   RADIOLOGY:  Chest, two view, no active disease.   Portable right knee shows right total knee arthroplasty with two Marella Chimes-  Miles wires circumferentially supporting a distal femur fracture.   CARDIOLOGY:  EKG:  Normal sinus rhythm.   HOSPITAL COURSE:  The patient underwent a right total knee replacement  by surgeon, Dr. Paralee Cancel, and assistant, Rowan Blase.  During surgery,  there was a distal femur fracture that was vertically transverse through  the mid femur.  It was reduced with Dall-Miles cables circumferentially  around the femur.  Therefore, she was put on partial weightbearing 25-  50% with gentle range-of-motion exercise of her knee.  She was  transfused 2 units of blood.  She did have some anemia as well as some  chronic renal insufficiency.  Her GFR was  only 19.  Her dressing was  changed on a daily basis with no significant drainage from the wound.  She made limited progress with physical therapy due to increased pain as  well as limited weightbearing status.   When seen on day 3, there had been no significant events and no  complaints.  Wound had no drainage.  She was ready for discharge to a  skilled nursing facility rehab.   DISCHARGE DISPOSITION:  Discharge to skilled nursing facility rehab in  stable and improved condition.   DISCHARGE PHYSICAL THERAPY:  She is partial weightbearing 25-50% with  the use of a rolling walker.  Want to work on gentle range-of-motion  exercises, nothing that is too stressful at this point.  She cannot  perform straight leg raises in the bed as well as some gentle isometric  exercises.  We do, though, want to work on some range-of-motion  exercises, but nothing that is too stressful.   DISCHARGE DIET:  Heart-healthy.   DISCHARGE WOUND CARE:  Keep dry.   DISCHARGE MEDICATIONS:  1.  Lovenox 40 mg subcu q.24h. x10 days, then started enteric-coated      aspirin 325 mg 1 p.o. daily.  2. Robaxin 500 mg 1 p.o. q.6h.  3. Colace 100 mg 1 p.o. b.i.d. p.r.n. constipation while on narcotics.  4. MiraLax 17 gm 1 p.o. daily p.r.n. constipation while on narcotics.  5. Norco 7.5 mg/325 1-2 p.o. q.4-6h. p.r.n. pain.  6. Amlodipine 5 mg q.a.m.  7. Colchicine 0.6 mg p.o. q.a.m.  8. Zetia 10 mg 1 p.o. q.a.m.  9. Synthroid 100 mcg 1 p.o. q.a.m.  10.Aspirin 81 mg.  Hold.  11.Sodium bicarbonate 650 mg 1 p.o. q.a.m. and 1 p.o. q.p.m.  12.Carvedilol 6.25 mg 1 p.o. q.a.m. and 1 p.o. q.p.m.  13.Vitamin D 50,000 IU monthly.   DISCHARGE FOLLOWUP:  Follow up with Dr. Alvan Dame in 2 weeks for a wound  check.  His phone number is [phone].  All questions directed to Dr.  Alvan Dame.   If there are any questions as far as her renal insufficiency, please  follow up with her primary care physician, Dr. Dione Housekeeper.      ______________________________  Carlean Jews. Collene Mares, [place]      Pietro Cassis. Alvan Dame, M.D.  Electronically Signed    BLM/MEDQ  D:  04/18/2008  T:  04/18/2008  Job:  [id number]   cc:   [name], M.D.  Fax: [phone]

## 2010-08-17 NOTE — Discharge Summary (Signed)
[name], SNIFF NO.:  [id number]   MEDICAL RECORD NO.:  [id number]          PATIENT TYPE:  INP   LOCATION:  [place]                         FACILITY:  [facility]   PHYSICIAN:  Pietro Cassis. Alvan Dame, M.D.  DATE OF BIRTH:  [birth year]   DATE OF ADMISSION:  11/06/2007  DATE OF DISCHARGE:  11/09/2007                               DISCHARGE SUMMARY   ADMISSION DIAGNOSES:  1. Osteoarthritis.  2. Gout.  3. Diabetes.  4. History of renal insufficiency.   DISCHARGE DIAGNOSES:  1. Osteoarthritis.  2. Gout.  3. Diabetes.  4. History of renal insufficiency.   HISTORY OF PRESENT ILLNESS:  A 70 year old female with history of left  knee pain secondary to osteoarthritis.  It was refractory to all  conservative treatment.   CONSULTATION:  None.   PROCEDURE:  Left total knee arthroplasty by surgeon, Dr. Paralee Cancel.  Assistant, Rowan Blase PA-C.   LABORATORY DATA:  CBC upon admission hemoglobin 12.4, hematocrit 36.9,  platelets 225.  Upon discharge hemoglobin 8.2, hematocrit 24.1,  platelets 152 and stable.  Chemistries with a sodium 143, potassium 4.7,  creatinine was 2.76 and glucose was 92 prior admission, tracked  throughout her course of stay.  At time of discharge, her sodium was  138, potassium 4.4, creatinine to 3.25 and glucose 139.  UA was negative  for nitrites.  Her coagulation was normal with INR of 1.   RADIOLOGY:  Chest two-view no acute disease.   CARDIOLOGY:  Normal sinus rhythm.   HOSPITAL COURSE:  The patient admitted to hospital and underwent a left  total knee replaced, tolerated procedure well.  She made minimal  progress during her course of stay, requiring assistance with ambulation  with the use rolling walker.  Otherwise, she did well with no  significant events.  She did have a mild fever during her course of stay  but it resolved.  Hemodynamically, she remained stable.  Dressing was  changed on a daily basis with no significant serosanguineous  ooze.  Her  wound looked great, no sign of infection.  She was neurovascularly  intact with left lower extremity throughout.  She had good quad function  with increasing strength on a daily basis.  She was weightbearing as  tolerated with use rolling walker making progress during her course of  stay.  She was seen on day #3, she was stable and ready for discharge to  a nursing facility rehab for further progress.   DISCHARGE DISPOSITION:  Discharged to skilled nurse facility rehab,  stable, in improved condition.   DISCHARGE PHYSICAL THERAPY:  Weightbearing as tolerated with use of  rolling walker.   DISCHARGE DIET:  Regular.   DISCHARGE WOUND CARE:  Keep dry.   DISCHARGE MEDICATIONS:  1. Lovenox 40 mg subcu q.24 h x11 days.  2. Robaxin 500 mg p.o. daily.  3. Enteric-coated aspirin 325 mg one p.o. daily x4 weeks after Lovenox      completed.  4. Iron 325 mg one p.o. t.i.d. x3 weeks.  5. Colace 100 mg p.o. b.i.d. while on narcotics.  6. MiraLax 17 grams p.o. daily while on narcotics.  7. Vicodin 5/325 one to two p.o. q.4-6 h p.r.n. pain.  8. Sodium bicarbonate 650 mg 1 p.o. q.a.m. 1 p.o. q.p.m.  9. Zetia 10 mg p.o. q.a.m.  10.She is on aspirin 81 mg p.o. q.a.m.  11.Synthroid 100 mcg 1 p.o. q.a.m.  12.Coreg 6.25 mg 1 p.o. q.a.m. and 1 p.o. q.p.m.  13.Amlodipine 10 mg 1 p.o. q.a.m., hold if systolic under [id number].  [id number] 0.6 mg 1 p.o. q.a.m.   DISCHARGE FOLLOWUP:  Follow up with Dr. Alvan Dame at phone number [phone] in  2 weeks for wound check.     ______________________________  Carlean Jews. Collene Mares, [place]      Pietro Cassis. Alvan Dame, M.D.  Electronically Signed    BLM/MEDQ  D:  11/09/2007  T:  11/09/2007  Job:  [id number]

## 2010-08-17 NOTE — Op Note (Signed)
NAMEJOREY, WILSEY NO.:  [id number]   MEDICAL RECORD NO.:  [id number]          PATIENT TYPE:  INP   LOCATION:  0004                         FACILITY:  [facility]   PHYSICIAN:  Pietro Cassis. Alvan Dame, M.D.  DATE OF BIRTH:  March 06, 1941   DATE OF PROCEDURE:  04/15/2008  DATE OF DISCHARGE:                               OPERATIVE REPORT   PREOPERATIVE DIAGNOSES:  1. Right knee osteoarthritis.  2. History of left total knee replacement.   POSTOPERATIVE DIAGNOSES:  1. Right knee osteoarthritis.  2. History of left total knee replacement.   PROCEDURES:  1. Right total knee replacement.  2. Open reduction, internal fixation with two Dall-Miles cables in the      distal femur fracture.   COMPONENTS USED:  DePuy rotating platform posterior stabilized knee  system with a size 2.5 femur, 2.5 tibia, 12.5 insert and a 35 patellar  button.   SURGEON:  Pietro Cassis. Alvan Dame, M.D.   ASSISTANT:  Carlean Jews. Mann, PA.   ANESTHESIA:  Spinal.   SPECIMEN:  None.   FINDINGS:  None.   DRAINS:  One Hemovac.   TOURNIQUET TIME:  53 minutes at 250 mmHg.   COMPLICATIONS:  There was a nondisplaced fracture of the distal femur  upon my retraction or subluxation of the tibia anteriorly.  At the time  that this was identified, the Dall-Miles cable system was opened and two  cables were placed, providing secure fixation to the fracture.   Though the fracture was identified, the operative team was very  efficient in providing [place] with the equipment needed to perform this  portion of the procedure and the tourniquet time was thus limited  probably by only 10 minutes.   INDICATIONS FOR PROCEDURE:  Ms. Aria is a 70 year old female known to me  from previous left total knee replacement in August 2009.  She had done  well with this, but had initially presented with bilateral knee  osteoarthritis.  Her right knee continued to be a major problem for her.  She was failing conservative measures.  She  wished to discuss surgical  options again.  The risks and benefits of knee replacement surgery were  discussed, including infection, DVT, component failure, need for  revision surgery, including the need for manipulation.  The postop  course and expectations were reviewed based on what she had been through  before.  Consent was obtained for the benefit of pain relief.   PROCEDURE IN DETAIL:  The patient was brought to the operative theater.  Once adequate anesthesia, preoperative antibiotics, Ancef administered,  the patient was positioned supine with a thigh tourniquet in place.  The  right lower extremity was pre-scrubbed and prepped and draped in a  sterile fashion.  A timeout was performed, identifying the patient and  extremity.   The leg was exsanguinated, tourniquet elevated to 250 mmHg.   A midline incision was made followed by median parapatellar arthrotomy.  Following initial debridement, attention was directed to the patella.  Precut measurement was approximately 19 mm.  I resected down to 13-14 mm  and chose to use the 35 patellar button which was used on the other  side.  It covered the cut surface of the patella well.   A metal shim was placed on the patella to protect it from the retractors  and saw blades.   Attention was now directed to the femur.  The femoral canal was noted to  have significant osteophytes covering the notch.  I used the three-  eighth inch osteotome at this point to debride osteophytes.  I then  removed the cruciate stumps.  Following this, I used a drill to open up  the femoral canal.  The femoral canal was opened and irrigated to  prevent fat emboli.  An intramedullary rod was then passed and at 3  degrees of valgus, I resected 10 mm of bone off the distal femur.   Following this cut, I attended to the tibia.   Noting her valgus tendency to her knee deformity, I limited my exposure  of the medial side.  There was no significant proximal  peel.   In an effort to try to subluxate the tibia which I felt was without any  excessive amount of force, I was using a Crego retractor.  The Crego  retractor was placed in the notch and I myself was applying the  pressure, not my assistant.  I felt a bit of a give.  It was at this  point that I identified a nondisplaced fracture of the distal femur.  The extent of the fracture was identified through dissection.  At this  point, I asked the circulating nurse to get the Dall-Miles cable system.   Once this fracture was identified and while I waiting for the system, I  went ahead and opened up the King-Kong clamp and placed it on distal  condyles to hold it in place.   The fracture remained nondisplaced throughout the entire time.   While this was being done, we attended to the tibia.  I was able to  gently subluxate the tibia anteriorly enough to get adequate exposure  placing medial and lateral retractors.  I then placed an extramedullary  guide and resected 8 mm of bone based off the lateral proximal tibia.   Following this resection, I checked with an extension block and was  unable to get the size 10 in adequately, so I removed 2 mm of bone by  reapplying the extramedullary guide en bloc.   I checked the cut surface to make sure it was perpendicular and it was.   At this point following this tibial cut, we had, had the Dall-Miles  cable system opened.  I had already dissected the distal femur and noted  the extent of the fracture.  Two Dall-Miles cables were then placed with  the cable passer and  tensioned  down.  The cables were cut.  The  fracture was anatomically reduced and maintained.   King-Kong clamp was removed and not utilized again.   At this point, we attended to the femur.  Femoral rotation was based off  of the proximal cut of the tibia once I had confirmed that it was  perpendicular.   I sized the femur and it was found to be between a size 2.5-3.  I chose   to use a 2.5 block based on the fact of the contralateral knee was a  size 2 and it appeared adequately sized.  The rotation block was pinned  into position with a 4:1 cutting block with  exchange.  The anterior cut  was made without notching or complications.  The posterior condyle cut  was made, followed by the chamfer cuts.   The final box cut was made off the lateral aspect of distal femur for  the size 2.5 femur.   At this point, I did place the femoral trial onto the femur and kept it  there to apply retraction against so there would be no further  complications of the femur fracture.  With this, I was able to subluxate  the tibia anteriorly enough.  A 2.5 tibial tray fit nicely on the cut  surface.  It was pinned into position through the medial third of the  tubercle.  I then drilled and keel punched the tibia to perform a trial  reduction.   With the 10-mm insert, we were noted to have a little bit of  hyperextension.  With this, we trialed with the 12.5 and the knee came  out to full extension and had good tightness in flexion.   At this point, all trial components were removed.  We irrigated the knee  with normal saline solution, prepared the synovial capsule junction with  60 mL of quarter-percent Marcaine with epinephrine and 1 mL of Toradol.   The cement was mixed as the final components were opened.  The final  components were then cemented into position.  Again, when I cemented the  tibial component in place I did place the trial femur onto the distal  femur to allow for the anterior displacement of the tibia without  contouring the femur.  Again, there was no complicating features on the  femur.   Following this, the femur was cemented in position.  I used the 12.5  insert with the knee in extension.  Extruded cement was removed.  Once  cement had cured and excessive cement was removed throughout the knee,  we irrigated the knee again and placed the final 12.5  insert.  The knee  was reduced.  We irrigated with normal saline solution with the pulse  lavage.  A medium Hemovac drain was placed deep.  The extensor mechanism  was then reapproximated using #1 Vicryl with the knee in flexion.  The  remaining wound was closed in layers with 3-0 Vicryl and 4-0 running  Monocryl.  The knee was cleaned, dried and dressed sterilely with a  sterile bulky Jones dressing.  She was brought to the recovery room with  a knee immobilizer in place, tolerating the procedure well.   At the time of dictation, I had already spoken to the family regarding  the case and the intraoperative complication.  I explained that this  would probably  delay a little bit in her overall recovery in terms of  weightbearing status and range of motion, but long-term I do not think  it will have long-term effect.  Questions were encouraged regarding  this.  I will follow up with her in the morning so she will understand  what happened.     Pietro Cassis Alvan Dame, M.D.  Electronically Signed    MDO/MEDQ  D:  04/15/2008  T:  04/15/2008  Job:  [id number]

## 2010-09-27 ENCOUNTER — Other Ambulatory Visit: Payer: Self-pay | Admitting: Orthopedic Surgery

## 2010-09-27 DIAGNOSIS — M79601 Pain in right arm: Secondary | ICD-10-CM

## 2010-09-27 DIAGNOSIS — M25511 Pain in right shoulder: Secondary | ICD-10-CM

## 2010-09-30 ENCOUNTER — Other Ambulatory Visit: Payer: Self-pay

## 2010-10-04 ENCOUNTER — Other Ambulatory Visit: Payer: Self-pay

## 2010-10-04 ENCOUNTER — Ambulatory Visit
Admission: RE | Admit: 2010-10-04 | Discharge: 2010-10-04 | Disposition: A | Discharge status: Home / Self Care | Payer: PRIVATE HEALTH INSURANCE | Source: Ambulatory Visit | Attending: Orthopedic Surgery | Admitting: Orthopedic Surgery

## 2010-10-04 DIAGNOSIS — M25511 Pain in right shoulder: Secondary | ICD-10-CM

## 2010-10-04 DIAGNOSIS — M79601 Pain in right arm: Secondary | ICD-10-CM

## 2010-12-31 LAB — BASIC METABOLIC PANEL
BUN: 30 — ABNORMAL HIGH
BUN: 41 — ABNORMAL HIGH
BUN: 41 — ABNORMAL HIGH
CO2: 25
CO2: 21
CO2: 23
Calcium: 9.4
Calcium: 8.1 — ABNORMAL LOW
Calcium: 8.3 — ABNORMAL LOW
Chloride: 110
Chloride: 111
Chloride: 112
Creatinine, Ser: 2.76 — ABNORMAL HIGH
Creatinine, Ser: 3.18 — ABNORMAL HIGH
Creatinine, Ser: 3.25 — ABNORMAL HIGH
GFR calc Af Amer: 21 — ABNORMAL LOW
GFR calc Af Amer: 17 — ABNORMAL LOW
GFR calc Af Amer: 18 — ABNORMAL LOW
GFR calc non Af Amer: 17 — ABNORMAL LOW
GFR calc non Af Amer: 14 — ABNORMAL LOW
GFR calc non Af Amer: 15 — ABNORMAL LOW
Glucose, Bld: 92
Glucose, Bld: 116 — ABNORMAL HIGH
Glucose, Bld: 139 — ABNORMAL HIGH
Potassium: 4.7
Potassium: 4.4
Potassium: 4.4
Sodium: 143
Sodium: 138
Sodium: 139

## 2010-12-31 LAB — TYPE AND SCREEN
ABO/RH(D): O POS
Antibody Screen: NEGATIVE

## 2010-12-31 LAB — CBC
HCT: 36.9
HCT: 24.1 — ABNORMAL LOW
HCT: 26.1 — ABNORMAL LOW
Hemoglobin: 12.4
Hemoglobin: 8.2 — ABNORMAL LOW
Hemoglobin: 8.8 — ABNORMAL LOW
MCHC: 33.6
MCHC: 33.8
MCHC: 34
MCV: 91.4
MCV: 90.7
MCV: 91.4
Platelets: 225
Platelets: 152
Platelets: 179
RBC: 4.04
RBC: 2.66 — ABNORMAL LOW
RBC: 2.86 — ABNORMAL LOW
RDW: 13.9
RDW: 13.4
RDW: 13.8
WBC: 8.1
WBC: 7.6
WBC: 7.7

## 2010-12-31 LAB — URINALYSIS, ROUTINE W REFLEX MICROSCOPIC
Bilirubin Urine: NEGATIVE
Glucose, UA: NEGATIVE
Hgb urine dipstick: NEGATIVE
Ketones, ur: NEGATIVE
Nitrite: NEGATIVE
Protein, ur: 30 — AB
Specific Gravity, Urine: 1.014
Urobilinogen, UA: 0.2
pH: 6.5

## 2010-12-31 LAB — DIFFERENTIAL
Basophils Absolute: 0
Basophils Relative: 0
Eosinophils Absolute: 0.5
Eosinophils Relative: 6 — ABNORMAL HIGH
Lymphocytes Relative: 18
Lymphs Abs: 1.5
Monocytes Absolute: 0.6
Monocytes Relative: 8
Neutro Abs: 5.5
Neutrophils Relative %: 68

## 2010-12-31 LAB — URINE MICROSCOPIC-ADD ON

## 2010-12-31 LAB — ABO/RH: ABO/RH(D): O POS

## 2010-12-31 LAB — PROTIME-INR
INR: 1
Prothrombin Time: 13.3

## 2010-12-31 LAB — APTT: aPTT: 32

## 2011-02-01 ENCOUNTER — Inpatient Hospital Stay (HOSPITAL_COMMUNITY)
Admission: EM | Admit: 2011-02-01 | Discharge: 2011-02-04 | DRG: 690 | Disposition: A | Discharge status: Home / Self Care | Payer: PRIVATE HEALTH INSURANCE | Attending: Internal Medicine | Admitting: Internal Medicine

## 2011-02-01 ENCOUNTER — Emergency Department (HOSPITAL_COMMUNITY): Payer: PRIVATE HEALTH INSURANCE

## 2011-02-01 DIAGNOSIS — E039 Hypothyroidism, unspecified: Secondary | ICD-10-CM | POA: Diagnosis present

## 2011-02-01 DIAGNOSIS — Z66 Do not resuscitate: Secondary | ICD-10-CM | POA: Diagnosis present

## 2011-02-01 DIAGNOSIS — M109 Gout, unspecified: Secondary | ICD-10-CM | POA: Diagnosis present

## 2011-02-01 DIAGNOSIS — N185 Chronic kidney disease, stage 5: Secondary | ICD-10-CM | POA: Diagnosis present

## 2011-02-01 DIAGNOSIS — N390 Urinary tract infection, site not specified: Principal | ICD-10-CM | POA: Diagnosis present

## 2011-02-01 DIAGNOSIS — R509 Fever, unspecified: Secondary | ICD-10-CM | POA: Diagnosis present

## 2011-02-01 DIAGNOSIS — Z87440 Personal history of urinary (tract) infections: Secondary | ICD-10-CM

## 2011-02-01 DIAGNOSIS — B9689 Other specified bacterial agents as the cause of diseases classified elsewhere: Secondary | ICD-10-CM | POA: Diagnosis present

## 2011-02-01 DIAGNOSIS — E119 Type 2 diabetes mellitus without complications: Secondary | ICD-10-CM | POA: Diagnosis present

## 2011-02-01 DIAGNOSIS — R109 Unspecified abdominal pain: Secondary | ICD-10-CM | POA: Diagnosis present

## 2011-02-01 DIAGNOSIS — I120 Hypertensive chronic kidney disease with stage 5 chronic kidney disease or end stage renal disease: Secondary | ICD-10-CM | POA: Diagnosis present

## 2011-02-01 LAB — URINE MICROSCOPIC-ADD ON

## 2011-02-01 LAB — BASIC METABOLIC PANEL
BUN: 44 mg/dL — ABNORMAL HIGH (ref 6–23)
CO2: 19 mEq/L (ref 19–32)
Calcium: 8.9 mg/dL (ref 8.4–10.5)
Chloride: 108 mEq/L (ref 96–112)
Creatinine, Ser: 3.08 mg/dL — ABNORMAL HIGH (ref 0.50–1.10)
GFR calc Af Amer: 17 mL/min — ABNORMAL LOW (ref >90)
GFR calc non Af Amer: 14 mL/min — ABNORMAL LOW (ref >90)
Glucose, Bld: 139 mg/dL — ABNORMAL HIGH (ref 70–99)
Potassium: 4.3 mEq/L (ref 3.5–5.1)
Sodium: 139 mEq/L (ref 135–145)

## 2011-02-01 LAB — DIFFERENTIAL
Basophils Absolute: 0 10*3/uL (ref 0.0–0.1)
Basophils Relative: 0 % (ref 0–1)
Eosinophils Absolute: 0.1 10*3/uL (ref 0.0–0.7)
Eosinophils Relative: 2 % (ref 0–5)
Lymphocytes Relative: 6 % — ABNORMAL LOW (ref 12–46)
Lymphs Abs: 0.4 10*3/uL — ABNORMAL LOW (ref 0.7–4.0)
Monocytes Absolute: 0.2 10*3/uL (ref 0.1–1.0)
Monocytes Relative: 3 % (ref 3–12)
Neutro Abs: 5.4 10*3/uL (ref 1.7–7.7)
Neutrophils Relative %: 89 % — ABNORMAL HIGH (ref 43–77)

## 2011-02-01 LAB — URINALYSIS, ROUTINE W REFLEX MICROSCOPIC
Bilirubin Urine: NEGATIVE
Glucose, UA: NEGATIVE mg/dL
Ketones, ur: NEGATIVE mg/dL
Nitrite: POSITIVE — AB
Protein, ur: 100 mg/dL — AB
Specific Gravity, Urine: 1.016 (ref 1.005–1.030)
Urobilinogen, UA: 0.2 mg/dL (ref 0.0–1.0)
pH: 6.5 (ref 5.0–8.0)

## 2011-02-01 LAB — CBC
HCT: 31.7 % — ABNORMAL LOW (ref 36.0–46.0)
Hemoglobin: 10.3 g/dL — ABNORMAL LOW (ref 12.0–15.0)
MCH: 29.6 pg (ref 26.0–34.0)
MCHC: 32.5 g/dL (ref 30.0–36.0)
MCV: 91.1 fL (ref 78.0–100.0)
Platelets: 147 10*3/uL — ABNORMAL LOW (ref 150–400)
RBC: 3.48 MIL/uL — ABNORMAL LOW (ref 3.87–5.11)
RDW: 14.1 % (ref 11.5–15.5)
WBC: 6.1 10*3/uL (ref 4.0–10.5)

## 2011-02-01 LAB — LACTIC ACID, PLASMA: Lactic Acid, Venous: 0.9 mmol/L (ref 0.5–2.2)

## 2011-02-02 LAB — CBC
HCT: 31.7 % — ABNORMAL LOW (ref 36.0–46.0)
Hemoglobin: 10.1 g/dL — ABNORMAL LOW (ref 12.0–15.0)
MCH: 29.4 pg (ref 26.0–34.0)
MCHC: 31.9 g/dL (ref 30.0–36.0)
MCV: 92.2 fL (ref 78.0–100.0)
Platelets: 147 10*3/uL — ABNORMAL LOW (ref 150–400)
RBC: 3.44 MIL/uL — ABNORMAL LOW (ref 3.87–5.11)
RDW: 14.3 % (ref 11.5–15.5)
WBC: 8.8 10*3/uL (ref 4.0–10.5)

## 2011-02-02 LAB — MAGNESIUM: Magnesium: 1.7 mg/dL (ref 1.5–2.5)

## 2011-02-02 LAB — BASIC METABOLIC PANEL
BUN: 42 mg/dL — ABNORMAL HIGH (ref 6–23)
CO2: 20 mEq/L (ref 19–32)
Calcium: 9 mg/dL (ref 8.4–10.5)
Chloride: 111 mEq/L (ref 96–112)
Creatinine, Ser: 3.04 mg/dL — ABNORMAL HIGH (ref 0.50–1.10)
GFR calc Af Amer: 17 mL/min — ABNORMAL LOW (ref >90)
GFR calc non Af Amer: 15 mL/min — ABNORMAL LOW (ref >90)
Glucose, Bld: 104 mg/dL — ABNORMAL HIGH (ref 70–99)
Potassium: 4.6 mEq/L (ref 3.5–5.1)
Sodium: 141 mEq/L (ref 135–145)

## 2011-02-03 LAB — URINE CULTURE
Colony Count: >=100000
Colony Count: NO GROWTH
Culture  Setup Time: 201210310420
Culture  Setup Time: 201210311722
Culture: NO GROWTH
Special Requests: NEGATIVE

## 2011-02-03 LAB — BASIC METABOLIC PANEL
BUN: 46 mg/dL — ABNORMAL HIGH (ref 6–23)
CO2: 19 mEq/L (ref 19–32)
Calcium: 8.7 mg/dL (ref 8.4–10.5)
Chloride: 109 mEq/L (ref 96–112)
Creatinine, Ser: 3.18 mg/dL — ABNORMAL HIGH (ref 0.50–1.10)
GFR calc Af Amer: 16 mL/min — ABNORMAL LOW (ref >90)
GFR calc non Af Amer: 14 mL/min — ABNORMAL LOW (ref >90)
Glucose, Bld: 80 mg/dL (ref 70–99)
Potassium: 4.2 mEq/L (ref 3.5–5.1)
Sodium: 138 mEq/L (ref 135–145)

## 2011-02-03 LAB — CBC
HCT: 29.4 % — ABNORMAL LOW (ref 36.0–46.0)
Hemoglobin: 9.6 g/dL — ABNORMAL LOW (ref 12.0–15.0)
MCH: 29.9 pg (ref 26.0–34.0)
MCHC: 32.7 g/dL (ref 30.0–36.0)
MCV: 91.6 fL (ref 78.0–100.0)
Platelets: 167 10*3/uL (ref 150–400)
RBC: 3.21 MIL/uL — ABNORMAL LOW (ref 3.87–5.11)
RDW: 14 % (ref 11.5–15.5)
WBC: 7 10*3/uL (ref 4.0–10.5)

## 2011-02-03 NOTE — H&P (Signed)
NAMESILKE, DRAYER NO.:  [id number]  MEDICAL RECORD NO.:  [id number]  LOCATION:  [id number]                         FACILITY:  [facility]  PHYSICIAN:  Laurence Compton, MD    DATE OF BIRTH:  1940/08/27  DATE OF ADMISSION:  02/01/2011 DATE OF DISCHARGE:                             HISTORY & PHYSICAL   PRIMARY CARE PHYSICIAN:  [name], MD  REASON FOR HOSPITAL VISIT:  Fever, chills.  HISTORY OF PRESENT ILLNESS:  This is a 70 year old Caucasian female with previous past medical and surgical history of. 1. Chronic kidney disease, stage 4. 2. Hypertension. 3. Hypothyroidism. 4. Dyslipidemia. 5. Diabetes mellitus, which is diet controlled. 6. History of bilateral knee surgeries. 7. History of shoulder surgery. 8. Recent history of UTI. 9. History of gout. This 70 year old female presented to the hospital with 2-day history of fever and chills.  She also describes some urinary frequency and some vague left-sided flank pain on and off.  Came to the hospital and was found to have a fever of 103.1, along with chills and rigors.  Urine at that time was suggestive of UTI.  Urine was sent for urine culture.  ER physician ordered 2 sets of blood cultures, IV antibiotics, IV fluids, and the patient was sent to the floor after she was accepted by my partner, Dr. Billey Chang, few hours ago.  At the time of my interview, the patient's symptoms have resolved.  She has received 1 dose of IV Cipro along with some IV fluids.  She is asymptomatic at this time.  Denies any fever, chills.  Denies any headache, nausea, vomiting.  No new problems with vision, hearing.  No problems swallowing food or liquids.  Denies any chest pain, palpitations, cough, phlegm, or shortness of breath.  No abdominal pain or discomfort at this time.  Bowel movements have been regular.  No blood in stool or urine.  Mild urinary frequency.  Denies any burning when she pees.  Denies noticing any  blood.  Denies any history of renal stones.  Denies any new joint pains or aches.  Denies any new focal weakness, tingling, numbness in any extremity.  Denies any new mental stressors.  Full 10-point review of systems was obtained.  Except as dictated above, all other review of systems is negative.  PAST MEDICAL AND SURGICAL HISTORY:  Above.  REVIEW OF SYSTEMS:  In HPI above.  FAMILY HISTORY:  Positive for coronary artery disease in the patient's father.  HOME MEDICATIONS:  Unconfirmed list: 1. Norvasc 5 mg p.o. daily. 2. Aspirin 81 mg p.o. daily. 3. Coreg 12.5 mg p.o. b.i.d. 4. Colchicine 0.6 mg p.o. p.r.n. 5. Vicodin 5/500 mg p.o. q.6 hours as needed. 6. Methocarbamol 500 mg p.o. p.r.n. 7. Sodium bicarbonate 650 mg p.o. b.i.d. 8. Synthroid 100 mcg p.o. daily. 9. Tylenol 650 mg p.o. p.r.n.  ALLERGIES: 1. AMOXICILLIN. 2. SULFA. 3. PERCOCET.  PHYSICAL EXAMINATION:  VITAL SIGNS:  T-max of 103.1, pulse 80, respirations 18, blood pressure 121/62.  She is 97% on room air. GENERAL:  Elderly, obese Caucasian female, lying in hospital bed, no apparent discomfort. HEENT:  Normocephalic, atraumatic head.  Pupils equal  and reactive to light.  Pink and moist tongue and throat.  No scleral icterus. CNS:  Oriented x3.  All cranial nerves intact.  No focal neurological deficits. PSYCH:  Insight is intact.  Not suicidal, homicidal. NECK:  Supple.  No JVD noted. PULMONARY:  Chest wall movement symmetrical.  Good air movement bilaterally.  Minimal bibasilar rales.  No wheezes. CVS:  Regular rate, rhythm.  Normal S1, S2.  No gallops, rubs.  No murmurs appreciated. ABDOMEN:  Obese, soft, positive bowel sounds.  Questionable mild left flank tenderness on palpation. SKIN:  No skin rashes or bruises. MUSCULOSKELETAL:  Muscle tone is normal in all 4 extremities.  No digital clubbing.  Trace bipedal edema.  LABORATORY DATA:  White count 6.1, hemoglobin 10.2, hematocrit 31.7, platelets 147.   No bands noted.  Sodium 139, potassium 4.3, chloride 108, bicarb 19, glucose 139, BUN 44, creatinine 3, calcium 8.9.  Lactic acid 0.9.  UA suggestive of UTI.  Chest x-ray, no active cardiopulmonary process.  ASSESSMENT AND PLAN: 1. Patient with urinary tract infection, questionable early left-sided     pyelonephritis.  At this time, the patient will be admitted to the     floor.  She is already receiving gentle IV fluids for hydration.     We will continue that for a total of 1 L.  IV ciprofloxacin.     Monitor blood and urine culture reports.  If still spiking fever     after 24 hours, consider renal ultrasound. 2. Chronic kidney disease, stage 4. 3. The patient has history of renal insufficiency.  Creatinine in our     records is about 3 for the last 1 year.  Outpatient Nephrology     followup recommended.  No acute issues.  We will continue p.o.     sodium bicarb supplement and put her on renal diet. 4. History of hypothyroidism.  Continue the patient on Synthroid at     home dose. 5. History of hypertension.  Continue on Coreg and Norvasc withholding     parameters. 6. Questionable history of gout, p.r.n. colchicine. 7. Morbid obesity.  Outpatient followup. 8. Heparin for deep vein thrombosis prophylaxis. 9. The patient wishes to be DNR.          ______________________________ Laurence Compton, MD     PS/MEDQ  D:  02/02/2011  T:  02/02/2011  Job:  [id number]  Electronically Signed by Lala Lund MD on 02/03/2011 03:13:54 PM

## 2011-02-04 MED ORDER — LEVOTHYROXINE SODIUM 100 MCG PO TABS
100.0000 ug | ORAL_TABLET | Freq: Every day | ORAL | Status: DC
  Filled 2011-02-04 (×2): qty 1

## 2011-02-04 MED ORDER — CARVEDILOL 12.5 MG PO TABS
12.5000 mg | ORAL_TABLET | Freq: Two times a day (BID) | ORAL | Status: DC
  Filled 2011-02-04 (×5): qty 1

## 2011-02-04 MED ORDER — ZOLPIDEM TARTRATE 5 MG PO TABS: 5.0000 mg | ORAL_TABLET | Freq: Every evening | ORAL | Status: DC | PRN

## 2011-02-04 MED ORDER — PROMETHAZINE HCL 12.5 MG PO TABS
6.2500 mg | ORAL_TABLET | Freq: Four times a day (QID) | ORAL | Status: DC | PRN
  Filled 2011-02-04: qty 1

## 2011-02-04 MED ORDER — ALUM & MAG HYDROXIDE-SIMETH 200-200-20 MG/5ML PO SUSP: 30.0000 mL | Freq: Four times a day (QID) | ORAL | Status: DC | PRN

## 2011-02-04 MED ORDER — ASPIRIN EC 81 MG PO TBEC
81.0000 mg | DELAYED_RELEASE_TABLET | Freq: Every day | ORAL | Status: DC
  Filled 2011-02-04 (×3): qty 1

## 2011-02-04 MED ORDER — BISACODYL 10 MG RE SUPP: 10.0000 mg | Freq: Every day | RECTAL | Status: DC | PRN

## 2011-02-04 MED ORDER — HEPARIN SODIUM (PORCINE) 5000 UNIT/ML IJ SOLN
5000.0000 [IU] | Freq: Three times a day (TID) | INTRAMUSCULAR | Status: DC
  Filled 2011-02-04 (×9): qty 1

## 2011-02-04 MED ORDER — ONDANSETRON HCL 4 MG/2ML IJ SOLN: 4.0000 mg | Freq: Four times a day (QID) | INTRAMUSCULAR | Status: DC | PRN

## 2011-02-04 MED ORDER — VITAMIN D (ERGOCALCIFEROL) 1.25 MG (50000 UNIT) PO CAPS: 50000.0000 [IU] | ORAL_CAPSULE | ORAL | Status: DC

## 2011-02-04 MED ORDER — AMLODIPINE BESYLATE 5 MG PO TABS
5.0000 mg | ORAL_TABLET | Freq: Every day | ORAL | Status: DC
  Filled 2011-02-04 (×3): qty 1

## 2011-02-04 MED ORDER — HYDROCODONE-ACETAMINOPHEN 5-325 MG PO TABS: 1.0000 | ORAL_TABLET | Freq: Four times a day (QID) | ORAL | Status: DC | PRN

## 2011-02-04 MED ORDER — CIPROFLOXACIN HCL 500 MG PO TABS
500.0000 mg | ORAL_TABLET | Freq: Every day | ORAL | Status: DC
  Filled 2011-02-04 (×3): qty 1

## 2011-02-04 MED ORDER — METHOCARBAMOL 500 MG PO TABS: 500.0000 mg | ORAL_TABLET | Freq: Four times a day (QID) | ORAL | Status: DC | PRN

## 2011-02-04 MED ORDER — ALBUTEROL SULFATE (5 MG/ML) 0.5% IN NEBU
2.5000 mg | INHALATION_SOLUTION | RESPIRATORY_TRACT | Status: DC | PRN
  Filled 2011-02-04: qty 20

## 2011-02-04 MED ORDER — SODIUM BICARBONATE 650 MG PO TABS
650.0000 mg | ORAL_TABLET | Freq: Two times a day (BID) | ORAL | Status: DC
  Filled 2011-02-04 (×6): qty 1

## 2011-02-04 MED ORDER — ACETAMINOPHEN 500 MG PO TABS: 1000.0000 mg | ORAL_TABLET | ORAL | Status: DC | PRN

## 2011-02-04 MED ORDER — ONDANSETRON HCL 4 MG PO TABS: 4.0000 mg | ORAL_TABLET | Freq: Four times a day (QID) | ORAL | Status: DC | PRN

## 2011-02-04 MED ORDER — EZETIMIBE 10 MG PO TABS
10.0000 mg | ORAL_TABLET | Freq: Every day | ORAL | Status: DC
  Filled 2011-02-04 (×3): qty 1

## 2011-02-05 NOTE — Discharge Summary (Signed)
  [name], [name] NO.:  [id number]  MEDICAL RECORD NO.:  [id number]  LOCATION:  [id number]                         [facility]ACILITY[facility]  [facility]  PHYSICIAN:  Hosie Poisson, MD       DATE OF BIRTH:  [birth year]  DATE OF ADMISSION:  02/01/2011 DATE OF DISCHARGE:  02/04/2011                              DISCHARGE SUMMARY   PRIMARY CARE PHYSICIAN:  [name], MD.  DISCHARGE DIAGNOSES: 1. Gram-negative urinary tract infection. 2. Stage IV to stage V chronic kidney disease. 3. Hypertension. 4. Hypothyroidism. 5. Hyperlipidemia. 6. Diet controlled diabetes. 7. History of gout.  DISCHARGE MEDICATIONS: 1. Ciprofloxacin 500 mg 1 tab daily for 4 more days. 2. Sodium bicarb 650 mg 1 tab b.i.d. 3. Tylenol extra-strength 500 mg 2 tabs q.4 hours p.r.n. 4. Maalox 30 mL q.6 hours p.r.n. 5. Colchicine 0.6 mg p.o. 1 tab daily. 6. Coreg 12.5 mg p.o. 1 tab b.i.d. 7. Methocarbamol 500 mg 1 tab q.6 hours p.r.n. 8. Zetia 10 mg 1 tab daily.9. Amlodipine 5 mg once daily. 10.Hydrocodone 5/500 mg 1 tab q.6 hours p.r.n. 11.Aspirin 81 mg daily. 12.Synthroid 100 mcg daily. 13.Vitamin D2 50,000 units 1 capsule daily monthly.  CONSULTATIONS:  None.  PROCEDURES:  None.  PERTINENT LABS:  On the day of discharge, patient had a CBC done which showed a slightly decreased hemoglobin and had a basic metabolic panel which showed a creatinine of 3.18.  BRIEF HOSPITAL COURSE: 1. This is a 70 year old lady, who came in for fever and chills.  She     was found to have a urinary tract infection.  Her urine culture     grew 100,000 colonies of Gram-negative rods.  She had a recent     urinary tract infection and has been on amoxicillin, but she could     not complete the course of the antibiotic and she came in for     persistent fever and chills.  She was found to have recurrent UTI     and she was started on ciprofloxacin.  Her fevers have resolved.     Blood cultures have been negative so far.   She received 3 days of     IV Cipro.  She will be discharged on 4 more days of p.o. Cipro to     complete the course of antibiotics. 2. Hypertension, controlled. 3. Stage IV to stage V CKD.  Patient follows Dr. Justin Mend as an     outpatient.  We recommend to see Dr. Justin Mend in 1 to 2 weeks. On the day of discharge, patient's vitals were within normal limits. Her exam was within normal limits.  She was hemodynamically stable for discharge.          ______________________________ Hosie Poisson, MD     VA/MEDQ  D:  02/04/2011  T:  02/05/2011  Job:  [id number]  Electronically Signed by Hosie Poisson MD on 02/05/2011 11:06:44 PM

## 2011-02-08 LAB — CULTURE, BLOOD (ROUTINE X 2)
Culture  Setup Time: 201210310147
Culture  Setup Time: 201210310147
Culture: NO GROWTH
Culture: NO GROWTH

## 2011-07-26 ENCOUNTER — Emergency Department (HOSPITAL_COMMUNITY)
Admission: EM | Admit: 2011-07-26 | Discharge: 2011-07-26 | Disposition: A | Discharge status: Home / Self Care | Payer: PRIVATE HEALTH INSURANCE | Attending: Emergency Medicine | Admitting: Emergency Medicine

## 2011-07-26 ENCOUNTER — Encounter (HOSPITAL_COMMUNITY): Payer: Self-pay

## 2011-07-26 ENCOUNTER — Emergency Department (HOSPITAL_COMMUNITY): Payer: PRIVATE HEALTH INSURANCE

## 2011-07-26 DIAGNOSIS — S0100XA Unspecified open wound of scalp, initial encounter: Secondary | ICD-10-CM | POA: Insufficient documentation

## 2011-07-26 DIAGNOSIS — S0101XA Laceration without foreign body of scalp, initial encounter: Secondary | ICD-10-CM

## 2011-07-26 DIAGNOSIS — E119 Type 2 diabetes mellitus without complications: Secondary | ICD-10-CM | POA: Insufficient documentation

## 2011-07-26 DIAGNOSIS — E7800 Pure hypercholesterolemia, unspecified: Secondary | ICD-10-CM | POA: Insufficient documentation

## 2011-07-26 DIAGNOSIS — W19XXXA Unspecified fall, initial encounter: Secondary | ICD-10-CM

## 2011-07-26 DIAGNOSIS — G319 Degenerative disease of nervous system, unspecified: Secondary | ICD-10-CM | POA: Insufficient documentation

## 2011-07-26 DIAGNOSIS — I10 Essential (primary) hypertension: Secondary | ICD-10-CM | POA: Insufficient documentation

## 2011-07-26 DIAGNOSIS — W010XXA Fall on same level from slipping, tripping and stumbling without subsequent striking against object, initial encounter: Secondary | ICD-10-CM | POA: Insufficient documentation

## 2011-07-26 HISTORY — DX: Essential (primary) hypertension: I10

## 2011-07-26 MED ORDER — LIDOCAINE-EPINEPHRINE (PF) 1 %-1:200000 IJ SOLN
INTRAMUSCULAR | Status: AC
  Administered 2011-07-26: 30 mL
  Filled 2011-07-26: qty 10

## 2011-07-26 MED ORDER — LIDOCAINE-EPINEPHRINE 1 %-1:100000 IJ SOLN: 20.0000 mL | Freq: Once | INTRAMUSCULAR | Status: DC

## 2011-07-26 NOTE — Discharge Instructions (Signed)
RESOURCE GUIDE  Dental Problems  Patients with Medicaid: [facility] [phone] [address]                                           [phone] Phone:  [phone]                                                  Phone:  [phone]  If unable to pay or uninsured, contact:  [facility] or [facility]. to become qualified for the adult dental clinic.  Chronic Pain Problems Contact [facility]  [phone] Patients need to be referred by their primary care doctor.  Insufficient Money for Medicine Contact [employer]:  call "211" or [facility] 646-292-0431.  No Primary Care Doctor Call [facility]  [phone] Other agencies that provide inexpensive medical care    [facility]  [phone]    [facility]  [phone]    [facility]  [phone]    [facility]  [phone]    [facility]  [phone]    [facility]  [phone]  Psychological Services [facility] Behavioral Health  [phone] [facility] Services  [phone] [facility]   [phone] (emergency services [phone])  Substance Abuse Resources [facility]  [phone] [facility] [phone] [facility] [place] [phone] Floydene Flock [phone] Residential & Outpatient Substance Abuse Program  6368716068  Abuse/Neglect [facility] Child Abuse Hotline (867)188-0711 [facility] Child Abuse Hotline 306-443-4323 (After Hours)  Emergency Shelter [facility] [phone]  Maternity Homes Room at the [place] [facility] [place] (865)543-3815 [facility] (910) [phone]  MRSA Hotline #:   262-434-9998    [facility] Resources  [facility]     [employer]                          [facility] Dept. [address] [place]                       [address]      [address]  Blondell Reveal Phone:  [phone]                                   Phone:  [phone]                 Phone:  [phone]  [facility] Mental [facility] Phone:  [phone]  [facility] Child Abuse Hotline 938-619-8466 715-691-0837 (After Hours)   Take your usual prescriptions as previously directed.  Wash the area gently with soap and water at least twice a day.  Call your regular medical doctor today to schedule a follow up appointment for a recheck within the next 3 days, and removal of your staples in the next 7 to 10 days.  Return to the Emergency Department immediately if worsening.

## 2011-07-26 NOTE — ED Notes (Signed)
Pt states has been falling a lot.

## 2011-07-26 NOTE — ED Provider Notes (Signed)
History     CSN: [id number]  Arrival date & time 07/26/11  0901   First MD Initiated Contact with Patient 07/26/11 [phone]      Chief Complaint  Patient presents with  . Head Laceration    (Consider location/radiation/quality/duration/timing/severity/associated sxs/prior treatment) Patient is a 71 y.o. female presenting with scalp laceration.  Head Laceration    Past Medical History  Diagnosis Date  . Diabetes mellitus   . Hypertension   . Thyroid disease   . High cholesterol   . Kidney failure     History reviewed. No pertinent past surgical history.  No family history on file.  History  Substance Use Topics  . Smoking status: Not on file  . Smokeless tobacco: Not on file  . Alcohol Use:     OB History    Grav Para Term Preterm Abortions TAB SAB Ect Mult Living                  Review of Systems  Allergies  Sulfa drugs cross reactors; Amoxicillin; and Percocet  Home Medications   Current Outpatient Rx  Name Route Sig Dispense Refill  . ACETAMINOPHEN 500 MG PO TABS Oral Take 500 mg by mouth every 4 (four) hours as needed. For pain     . AMLODIPINE BESYLATE 5 MG PO TABS Oral Take 5 mg by mouth daily.      . ASPIRIN EC 81 MG PO TBEC Oral Take 81 mg by mouth daily.      Marland Kitchen CARVEDILOL 12.5 MG PO TABS Oral Take 12.5 mg by mouth 2 (two) times daily.      . COLCHICINE 0.6 MG PO TABS Oral Take 0.6 mg by mouth daily.      Marland Kitchen EZETIMIBE 10 MG PO TABS Oral Take 10 mg by mouth daily.     Marland Kitchen HYDROCODONE-ACETAMINOPHEN 5-500 MG PO CAPS Oral Take 1 capsule by mouth every 6 (six) hours as needed. For pain     . LEVOTHYROXINE SODIUM 100 MCG PO TABS Oral Take 100 mcg by mouth daily.      Marland Kitchen METHOCARBAMOL 500 MG PO TABS Oral Take 500 mg by mouth every 6 (six) hours as needed. For muscle spasms      . SODIUM BICARBONATE 650 MG PO TABS Oral Take 650 mg by mouth 2 (two) times daily.      Marland Kitchen VITAMIN D (ERGOCALCIFEROL) 50000 UNITS PO CAPS Oral Take 50,000 Units by mouth every 30  (thirty) days.        BP 131/68  Pulse 56  Temp(Src) 98.5 F (36.9 C) (Oral)  Resp 20  SpO2 98%  Physical Exam  ED Course  Procedures (including critical care time)  Labs Reviewed - No data to display Ct Head Wo Contrast  07/26/2011  *RADIOLOGY REPORT*  Clinical Data: Fall with a blow to the head.  CT HEAD WITHOUT CONTRAST  Technique:  Contiguous axial images were obtained from the base of the skull through the vertex without contrast.  Comparison: Head CT scan 04/21/2010.  Findings: There is cortical atrophy.  No evidence of acute abnormality including infarction, hemorrhage, mass lesion, mass effect, midline shift or abnormal extra-axial fluid collection.  No hydrocephalus or pneumocephalus.  Calvarium intact.  IMPRESSION: No acute finding.  Original Report Authenticated By: Arvid Right. Luther Parody, M.D.     No diagnosis found.  LACERATION REPAIR Performed by: Faustino Congress Authorized by: Faustino Congress Consent: Verbal consent obtained. Risks and benefits: risks, benefits and alternatives were discussed Consent given  by: patient Patient identity confirmed: provided demographic data Wound explored  Laceration Location: vertex of scalp  Laceration Length: 5 cm  No Foreign Bodies seen or palpated  Anesthesia: local infiltration  Local anesthetic: lidocaine 1% with epinephrine  Anesthetic total: 5 ml  Irrigation method: dermal cleanser with skin scrub, irrigation prior by nurse Amount of cleaning: standard  Skin closure: Surgical staples  Number of staples - 4  Patient tolerance: Patient tolerated the procedure well with no immediate complications.  [place], PA 07/26/11 1106

## 2011-07-26 NOTE — ED Provider Notes (Signed)
History     CSN: [id number]  Arrival date & time 07/26/11  0901   First MD Initiated Contact with Patient 07/26/11 [phone]      Chief Complaint  Patient presents with  . Head Laceration    HPI Pt was seen at 0930.  Per pt and family, c/o sudden onset and resolution of one episode of slip/trip and fall while walking to the BR with her walker this morning PTA.  Pt states she thinks she hit the top of her head on corner of a TV.  Endorses hx of frequent falls.  Denies LOC, no neck or back pain, no prodromal symptoms before fall, no N/V/D, no CP/SOB.    Td UTD Past Medical History  Diagnosis Date  . Diabetes mellitus   . Hypertension   . Thyroid disease   . High cholesterol   . Kidney failure     History reviewed. No pertinent past surgical history.   History  Substance Use Topics  . Smoking status: Not on file  . Smokeless tobacco: Not on file  . Alcohol Use:     Review of Systems ROS: Statement: All systems negative except as marked or noted in the HPI; Constitutional: Negative for fever and chills. ; ; Eyes: Negative for eye pain, redness and discharge. ; ; ENMT: Negative for ear pain, hoarseness, nasal congestion, sinus pressure and sore throat. ; ; Cardiovascular: Negative for chest pain, palpitations, diaphoresis, dyspnea and peripheral edema. ; ; Respiratory: Negative for cough, wheezing and stridor. ; ; Gastrointestinal: Negative for nausea, vomiting, diarrhea, abdominal pain, blood in stool, hematemesis, jaundice and rectal bleeding. . ; ; Genitourinary: Negative for dysuria, flank pain and hematuria. ; ; Musculoskeletal: Negative for back pain and neck pain. Negative for swelling and trauma.; ; Skin: +scalp lac. Negative for pruritus, rash, abrasions, blisters, bruising and skin lesion.; ; Neuro: Negative for headache, lightheadedness and neck stiffness. Negative for weakness, altered level of consciousness , altered mental status, extremity weakness, paresthesias, involuntary  movement, seizure and syncope.      Allergies  Sulfa drugs cross reactors; Amoxicillin; and Percocet  Home Medications   Current Outpatient Rx  Name Route Sig Dispense Refill  . ACETAMINOPHEN 500 MG PO TABS Oral Take 500 mg by mouth every 4 (four) hours as needed. For pain     . AMLODIPINE BESYLATE 5 MG PO TABS Oral Take 5 mg by mouth daily.      . ASPIRIN EC 81 MG PO TBEC Oral Take 81 mg by mouth daily.      Marland Kitchen CARVEDILOL 12.5 MG PO TABS Oral Take 12.5 mg by mouth 2 (two) times daily.      . COLCHICINE 0.6 MG PO TABS Oral Take 0.6 mg by mouth daily.      Marland Kitchen EZETIMIBE 10 MG PO TABS Oral Take 10 mg by mouth daily.      Marland Kitchen HYDROCODONE-ACETAMINOPHEN 5-500 MG PO CAPS Oral Take 1 capsule by mouth every 6 (six) hours as needed. For pain     . LEVOTHYROXINE SODIUM 100 MCG PO TABS Oral Take 100 mcg by mouth daily.      Marland Kitchen METHOCARBAMOL 500 MG PO TABS Oral Take 500 mg by mouth every 6 (six) hours as needed. For muscle spasms      . SODIUM BICARBONATE 650 MG PO TABS Oral Take 650 mg by mouth 2 (two) times daily.      Marland Kitchen VITAMIN D (ERGOCALCIFEROL) 50000 UNITS PO CAPS Oral Take 50,000 Units by mouth  every 30 (thirty) days.        BP 163/71  Pulse 62  Temp(Src) 98.5 F (36.9 C) (Oral)  Resp 20  SpO2 99%  Physical Exam 0935: Physical examination:  Nursing notes reviewed; Vital signs and O2 SAT reviewed;  Constitutional: Well developed, Well nourished, Well hydrated, In no acute distress; Head:  Normocephalic, +approx 5cm linear lac to vertex scalp, hemostatic; Eyes: EOMI, PERRL, No scleral icterus; ENMT: Mouth and pharynx normal, Mucous membranes moist; Neck: Supple, Full range of motion, No midline tenderness; Cardiovascular: Regular rate and rhythm, No murmur, rub, or gallop; Respiratory: Breath sounds clear & equal bilaterally, No rales, rhonchi, wheezes, or rub, Normal respiratory effort/excursion; Chest: Nontender, Movement normal; Abdomen: Soft, Nontender, Nondistended, Normal bowel sounds;  Extremities: Pulses normal, No tenderness, 1+ pedal edema bilat without calf asymmetry.; Neuro: AA&Ox3, Major CN grossly intact. Speech clear, no facial droop.  No gross focal motor or sensory deficits in extremities.; Skin: Color normal, Warm, Dry, no rash.    ED Course  Procedures    MDM  MDM Reviewed: nursing note and vitals Interpretation: CT scan   Ct Head Wo Contrast 07/26/2011  *RADIOLOGY REPORT*  Clinical Data: Fall with a blow to the head.  CT HEAD WITHOUT CONTRAST  Technique:  Contiguous axial images were obtained from the base of the skull through the vertex without contrast.  Comparison: Head CT scan 04/21/2010.  Findings: There is cortical atrophy.  No evidence of acute abnormality including infarction, hemorrhage, mass lesion, mass effect, midline shift or abnormal extra-axial fluid collection.  No hydrocephalus or pneumocephalus.  Calvarium intact.  IMPRESSION: No acute finding.  Original Report Authenticated By: Arvid Right. D'ALESSIO, M.D.       11:16 AM:  Lac repair by midlevel provider.  Wants to go home now.  Dx testing d/w pt and family.  Questions answered.  Verb understanding, agreeable to d/c home with outpt f/u.      Alfonzo Feller, DO 07/28/11 1207

## 2011-07-26 NOTE — ED Notes (Signed)
Pt states tripped and fell going to the bathroom this am, denies loc, small lac noted with bleeding controlled. Pt uses a walker.

## 2011-07-28 NOTE — ED Provider Notes (Signed)
Medical procedure (lac repair) only was performed by non-physician practitioner(s).  I personally evaluated the patient during the encounter and as supervising physician I was immediately available for consultation/collaboration with the wound repair.  Please seen my separate note.     Alfonzo Feller, DO 07/28/11 1209

## 2012-02-27 ENCOUNTER — Other Ambulatory Visit: Payer: Self-pay | Admitting: *Deleted

## 2012-02-27 DIAGNOSIS — Z01810 Encounter for preprocedural cardiovascular examination: Secondary | ICD-10-CM

## 2012-02-27 DIAGNOSIS — N183 Chronic kidney disease, stage 3 unspecified: Secondary | ICD-10-CM

## 2012-03-14 ENCOUNTER — Encounter: Payer: Self-pay | Admitting: Vascular Surgery

## 2012-03-15 ENCOUNTER — Ambulatory Visit (INDEPENDENT_AMBULATORY_CARE_PROVIDER_SITE_OTHER): Payer: PRIVATE HEALTH INSURANCE | Admitting: Vascular Surgery

## 2012-03-15 ENCOUNTER — Encounter: Payer: Self-pay | Admitting: Vascular Surgery

## 2012-03-15 VITALS — BP 127/59 | HR 63 | Ht 63.0 in | Wt 244.0 lb

## 2012-03-15 DIAGNOSIS — N183 Chronic kidney disease, stage 3 unspecified: Secondary | ICD-10-CM

## 2012-03-15 DIAGNOSIS — Z01810 Encounter for preprocedural cardiovascular examination: Secondary | ICD-10-CM

## 2012-03-15 DIAGNOSIS — Z992 Dependence on renal dialysis: Secondary | ICD-10-CM | POA: Insufficient documentation

## 2012-03-15 DIAGNOSIS — N186 End stage renal disease: Secondary | ICD-10-CM

## 2012-03-15 NOTE — Progress Notes (Signed)
Bilateral upper extremity vein mapping performed @ [facility] 03/15/2012

## 2012-03-15 NOTE — Progress Notes (Signed)
[facility]   History of Present Illness:  Patient is a 71 y.o. year old female who presents for placement of a permanent hemodialysis access. The patient is right handed .  The patient is not currently on hemodialysis.  The cause of renal failure is thought to be secondary to  Diabetes and hypertension.  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  Past Medical History  Diagnosis Date  . Diabetes mellitus   . Hypertension   . Thyroid disease   . High cholesterol   . Kidney failure     Past Surgical History  Procedure Date  . Joint replacement     bilateral knee  . Joint replacement     shoulder     Social History History  Substance Use Topics  . Smoking status: Former Smoker    Types: Cigarettes  . Smokeless tobacco: Never Used  . Alcohol Use: No    Family History Family History  Problem Relation Age of Onset  . Diabetes Sister   . Cancer Brother   . Hyperlipidemia Daughter   . Hypertension Daughter   . Hypertension Son     Allergies  Allergies  Allergen Reactions  . Sulfa Drugs Cross Reactors Hives and Itching  . Amoxicillin Rash  . Percocet (Oxycodone-Acetaminophen) Itching and Rash     Current Outpatient Prescriptions  Medication Sig Dispense Refill  . acetaminophen (TYLENOL) 500 MG tablet Take 500 mg by mouth every 4 (four) hours as needed. For pain       . amLODipine (NORVASC) 5 MG tablet Take 5 mg by mouth daily.        Marland Kitchen aspirin EC 81 MG tablet Take 81 mg by mouth daily.        . carvedilol (COREG) 12.5 MG tablet Take 12.5 mg by mouth 2 (two) times daily.        . ciprofloxacin (CIPRO) 500 MG tablet Take by mouth.      . colchicine 0.6 MG tablet Take 0.6 mg by mouth daily.        Marland Kitchen ezetimibe (ZETIA) 10 MG tablet Take 10 mg by mouth daily.       Marland Kitchen levothyroxine (SYNTHROID, LEVOTHROID) 100 MCG tablet Take 100 mcg by mouth daily.        . methocarbamol (ROBAXIN) 500 MG  tablet Take 500 mg by mouth every 6 (six) hours as needed. For muscle spasms        . sodium bicarbonate 650 MG tablet Take 650 mg by mouth 2 (two) times daily.        . Vitamin D, Ergocalciferol, (DRISDOL) 50000 UNITS CAPS Take 50,000 Units by mouth every 30 (thirty) days.        Marland Kitchen ZEMPLAR 1 MCG capsule Take 1 capsule by mouth 3 (three) times a week.      . hydrocodone-acetaminophen (LORCET-HD) 5-500 MG per capsule Take 1 capsule by mouth every 6 (six) hours as needed. For pain       . traMADol (ULTRAM) 50 MG tablet Take 1 tablet by mouth as needed.        ROS:   General:  No weight loss, Fever, chills  HEENT: No recent headaches, no nasal bleeding, no visual changes, no sore throat  Neurologic: No dizziness, blackouts, seizures. No recent symptoms of stroke or mini- stroke. No recent episodes of slurred speech, or temporary blindness.  Cardiac: No recent episodes of chest pain/pressure, no shortness of breath at rest.  No shortness of breath with exertion.  Denies history of atrial fibrillation or irregular heartbeat  Vascular: No history of rest pain in feet.  No history of claudication.  No history of non-healing ulcer, No history of DVT   Pulmonary: No home oxygen, no productive cough, no hemoptysis,  No asthma or wheezing  Musculoskeletal:  [ ]  Arthritis, [ ]  Low back pain,  [ ]  Joint pain  Hematologic:No history of hypercoagulable state.  No history of easy bleeding.  No history of anemia  Gastrointestinal: No hematochezia or melena,  No gastroesophageal reflux, no trouble swallowing  Urinary: [x ] chronic Kidney disease, [ ]  on HD - [ ]  MWF or [ ]  TTHS, [ ]  Burning with urination, [ ]  Frequent urination, [ ]  Difficulty urinating;   Skin: No rashes  Psychological: No history of anxiety,  No history of depression   Physical Examination  Filed Vitals:   03/15/12 1528  BP: 127/59  Pulse: 63  Height: 5\' 3"  (1.6 m)  Weight: 244 lb (110.678 kg)  SpO2: 100%    Body mass  index is 43.22 kg/(m^2).  General:  Alert and oriented, no acute distress HEENT: Normal Neck: No bruit or JVD Pulmonary: Clear to auscultation bilaterally Cardiac: Regular Rate and Rhythm without murmur Gastrointestinal: Soft, non-tender, non-distended, no mass, no scars, obese Skin: No rash Extremity Pulses:  2+ radial, brachial pulses bilaterally Musculoskeletal: No deformity or edema  Neurologic: Upper and lower extremity motor 5/5 and symmetric  DATA:   The patient had a vein mapping ultrasound today which are reviewed and interpreted. This shows the cephalic vein on the right side is not visualized. The basilic vein on the right side is 6-9 mm in the upper arm. It is small in the lower arm. On the left side the cephalic vein is too small to be used in the forearm from the antecubital area up it is 7 mm in diameter. The basilic vein is too small on the left.   ASSESSMENT: Needs hemodialysis access.   PLAN:  Left brachiocephalic AV fistula [id number]. Risks benefits possible complications and procedure details including but limited to bleeding infection non-maturation of fistula ischemic steal were explained the patient and her family today. She understands and agrees to proceed.  Ruta Hinds, MD [facility] [place] Office: [phone] Pager: [phone]

## 2012-03-20 ENCOUNTER — Other Ambulatory Visit: Payer: Self-pay

## 2012-03-21 ENCOUNTER — Encounter (HOSPITAL_COMMUNITY): Payer: Self-pay | Admitting: Respiratory Therapy

## 2012-03-29 ENCOUNTER — Encounter (HOSPITAL_COMMUNITY): Payer: Self-pay

## 2012-03-29 ENCOUNTER — Ambulatory Visit (HOSPITAL_COMMUNITY)
Admission: RE | Admit: 2012-03-29 | Discharge: 2012-03-29 | Disposition: A | Discharge status: Home / Self Care | Payer: PRIVATE HEALTH INSURANCE | Source: Ambulatory Visit | Attending: Anesthesiology | Admitting: Anesthesiology

## 2012-03-29 ENCOUNTER — Encounter (HOSPITAL_COMMUNITY)
Admission: RE | Admit: 2012-03-29 | Discharge: 2012-03-29 | Disposition: A | Discharge status: Home / Self Care | Payer: PRIVATE HEALTH INSURANCE | Source: Ambulatory Visit | Attending: Vascular Surgery | Admitting: Vascular Surgery

## 2012-03-29 DIAGNOSIS — Z01818 Encounter for other preprocedural examination: Secondary | ICD-10-CM | POA: Insufficient documentation

## 2012-03-29 HISTORY — DX: Unspecified osteoarthritis, unspecified site: M19.90

## 2012-03-29 LAB — SURGICAL PCR SCREEN
MRSA, PCR: NEGATIVE
Staphylococcus aureus: NEGATIVE

## 2012-03-29 NOTE — Pre-Procedure Instructions (Signed)
[place]  03/29/2012   Your procedure is scheduled on:  Tuesday April 03, 2012  Report to [facility] at 8:30 AM.  Call this number if you have problems the morning of surgery: [phone]   Remember:   Do not eat food or drink :After Midnight.  .     Take these medicines the morning of surgery with A SIP OF WATER: amlodipine, carvedilol,  colchicine, synthroid,    Do not wear jewelry, make-up or nail polish.  Do not wear lotions, powders, or perfumes.   Do not shave 48 hours prior to surgery.  Do not bring valuables to the hospital.  Contacts, dentures or bridgework may not be worn into surgery.  Leave suitcase in the car. After surgery it may be brought to your room.  For patients admitted to the hospital, checkout time is 11:00 AM the day of discharge.   Patients discharged the day of surgery will not be allowed to drive home.  Name and phone number of your driver: family/ friend  Special Instructions: Shower using CHG 2 nights before surgery and the night before surgery.  If you shower the day of surgery use CHG.  Use special wash - you have one bottle of CHG for all showers.  You should use approximately 1/3 of the bottle for each shower.   Please read over the following fact sheets that you were given: Pain Booklet, Coughing and Deep Breathing, MRSA Information and Surgical Site Infection Prevention

## 2012-04-02 MED ORDER — SODIUM CHLORIDE 0.9 % IV SOLN
INTRAVENOUS | Status: DC
  Administered 2012-04-03 (×2): via INTRAVENOUS

## 2012-04-02 MED ORDER — SODIUM CHLORIDE 0.9 % IV SOLN
1500.0000 mg | INTRAVENOUS | Status: DC
  Filled 2012-04-02: qty 1500

## 2012-04-03 ENCOUNTER — Ambulatory Visit (HOSPITAL_COMMUNITY): Payer: PRIVATE HEALTH INSURANCE | Admitting: Anesthesiology

## 2012-04-03 ENCOUNTER — Encounter (HOSPITAL_COMMUNITY): Payer: Self-pay | Admitting: Anesthesiology

## 2012-04-03 ENCOUNTER — Encounter (HOSPITAL_COMMUNITY): Payer: Self-pay | Admitting: *Deleted

## 2012-04-03 ENCOUNTER — Encounter (HOSPITAL_COMMUNITY)
Admission: RE | Disposition: A | Discharge status: Home / Self Care | Payer: Self-pay | Source: Ambulatory Visit | Attending: Vascular Surgery

## 2012-04-03 ENCOUNTER — Encounter (HOSPITAL_COMMUNITY): Payer: Self-pay | Admitting: Vascular Surgery

## 2012-04-03 ENCOUNTER — Ambulatory Visit (HOSPITAL_COMMUNITY)
Admission: RE | Admit: 2012-04-03 | Discharge: 2012-04-03 | Disposition: A | Discharge status: Home / Self Care | Payer: PRIVATE HEALTH INSURANCE | Source: Ambulatory Visit | Attending: Vascular Surgery | Admitting: Vascular Surgery

## 2012-04-03 DIAGNOSIS — N186 End stage renal disease: Secondary | ICD-10-CM | POA: Insufficient documentation

## 2012-04-03 DIAGNOSIS — Z882 Allergy status to sulfonamides status: Secondary | ICD-10-CM | POA: Insufficient documentation

## 2012-04-03 DIAGNOSIS — Z96659 Presence of unspecified artificial knee joint: Secondary | ICD-10-CM | POA: Insufficient documentation

## 2012-04-03 DIAGNOSIS — E119 Type 2 diabetes mellitus without complications: Secondary | ICD-10-CM | POA: Insufficient documentation

## 2012-04-03 DIAGNOSIS — Z885 Allergy status to narcotic agent status: Secondary | ICD-10-CM | POA: Insufficient documentation

## 2012-04-03 DIAGNOSIS — Z87891 Personal history of nicotine dependence: Secondary | ICD-10-CM | POA: Insufficient documentation

## 2012-04-03 DIAGNOSIS — E079 Disorder of thyroid, unspecified: Secondary | ICD-10-CM | POA: Insufficient documentation

## 2012-04-03 DIAGNOSIS — Z833 Family history of diabetes mellitus: Secondary | ICD-10-CM | POA: Insufficient documentation

## 2012-04-03 DIAGNOSIS — Z880 Allergy status to penicillin: Secondary | ICD-10-CM | POA: Insufficient documentation

## 2012-04-03 DIAGNOSIS — Z8249 Family history of ischemic heart disease and other diseases of the circulatory system: Secondary | ICD-10-CM | POA: Insufficient documentation

## 2012-04-03 DIAGNOSIS — I120 Hypertensive chronic kidney disease with stage 5 chronic kidney disease or end stage renal disease: Secondary | ICD-10-CM | POA: Insufficient documentation

## 2012-04-03 DIAGNOSIS — Z96619 Presence of unspecified artificial shoulder joint: Secondary | ICD-10-CM | POA: Insufficient documentation

## 2012-04-03 DIAGNOSIS — Z809 Family history of malignant neoplasm, unspecified: Secondary | ICD-10-CM | POA: Insufficient documentation

## 2012-04-03 DIAGNOSIS — E7800 Pure hypercholesterolemia, unspecified: Secondary | ICD-10-CM | POA: Insufficient documentation

## 2012-04-03 HISTORY — PX: AV FISTULA PLACEMENT: SHX1204

## 2012-04-03 LAB — GLUCOSE, CAPILLARY
Glucose-Capillary: 92 mg/dL (ref 70–99)
Glucose-Capillary: 93 mg/dL (ref 70–99)
Glucose-Capillary: 81 mg/dL (ref 70–99)
Glucose-Capillary: 85 mg/dL (ref 70–99)

## 2012-04-03 LAB — POCT I-STAT, CHEM 8
BUN: 44 mg/dL — ABNORMAL HIGH (ref 6–23)
Calcium, Ion: 1.21 mmol/L (ref 1.13–1.30)
Chloride: 115 mEq/L — ABNORMAL HIGH (ref 96–112)
Creatinine, Ser: 2.9 mg/dL — ABNORMAL HIGH (ref 0.50–1.10)
Glucose, Bld: 100 mg/dL — ABNORMAL HIGH (ref 70–99)
HCT: 38 % (ref 36.0–46.0)
Hemoglobin: 12.9 g/dL (ref 12.0–15.0)
Potassium: 4.7 mEq/L (ref 3.5–5.1)
Sodium: 143 mEq/L (ref 135–145)
TCO2: 20 mmol/L (ref 0–100)

## 2012-04-03 SURGERY — ARTERIOVENOUS (AV) FISTULA CREATION
Anesthesia: Monitor Anesthesia Care | Site: Arm Upper | Laterality: Left

## 2012-04-03 MED ORDER — THROMBIN 20000 UNITS EX SOLR
CUTANEOUS | Status: AC
  Filled 2012-04-03: qty 20000

## 2012-04-03 MED ORDER — FENTANYL CITRATE 0.05 MG/ML IJ SOLN
INTRAMUSCULAR | Status: DC | PRN
  Administered 2012-04-03 (×2): 50 ug via INTRAVENOUS

## 2012-04-03 MED ORDER — VANCOMYCIN HCL IN DEXTROSE 1-5 GM/200ML-% IV SOLN
INTRAVENOUS | Status: AC
  Filled 2012-04-03: qty 200

## 2012-04-03 MED ORDER — VANCOMYCIN HCL IN DEXTROSE 1-5 GM/200ML-% IV SOLN
1000.0000 mg | INTRAVENOUS | Status: AC
  Administered 2012-04-03: 1000 mg via INTRAVENOUS

## 2012-04-03 MED ORDER — PHENYLEPHRINE HCL 10 MG/ML IJ SOLN
INTRAMUSCULAR | Status: DC | PRN
  Administered 2012-04-03: 40 ug via INTRAVENOUS

## 2012-04-03 MED ORDER — HEPARIN SODIUM (PORCINE) 1000 UNIT/ML IJ SOLN
INTRAMUSCULAR | Status: DC | PRN
  Administered 2012-04-03: 5000 [IU] via INTRAVENOUS

## 2012-04-03 MED ORDER — DEXTROSE 5 % IV SOLN
INTRAVENOUS | Status: DC | PRN
  Administered 2012-04-03: 17:00:00 via INTRAVENOUS

## 2012-04-03 MED ORDER — ONDANSETRON HCL 4 MG/2ML IJ SOLN
INTRAMUSCULAR | Status: DC | PRN
  Administered 2012-04-03: 4 mg via INTRAVENOUS

## 2012-04-03 MED ORDER — MIDAZOLAM HCL 5 MG/5ML IJ SOLN
INTRAMUSCULAR | Status: DC | PRN
  Administered 2012-04-03: 1 mg via INTRAVENOUS

## 2012-04-03 MED ORDER — 0.9 % SODIUM CHLORIDE (POUR BTL) OPTIME
TOPICAL | Status: DC | PRN
  Administered 2012-04-03: 1000 mL

## 2012-04-03 MED ORDER — LIDOCAINE HCL (CARDIAC) 20 MG/ML IV SOLN
INTRAVENOUS | Status: DC | PRN
  Administered 2012-04-03: 50 mg via INTRAVENOUS

## 2012-04-03 MED ORDER — SODIUM CHLORIDE 0.9 % IR SOLN
Status: DC | PRN
  Administered 2012-04-03: 17:00:00

## 2012-04-03 MED ORDER — SUCCINYLCHOLINE CHLORIDE 20 MG/ML IJ SOLN
INTRAMUSCULAR | Status: DC | PRN
  Administered 2012-04-03: 180 mg via INTRAVENOUS

## 2012-04-03 MED ORDER — PROPOFOL 10 MG/ML IV BOLUS
INTRAVENOUS | Status: DC | PRN
  Administered 2012-04-03: 20 mg via INTRAVENOUS
  Administered 2012-04-03: 180 mg via INTRAVENOUS

## 2012-04-03 MED ORDER — VANCOMYCIN HCL 1000 MG IV SOLR
1000.0000 mg | INTRAVENOUS | Status: DC | PRN
  Administered 2012-04-03: 1000 mg via INTRAVENOUS

## 2012-04-03 MED ORDER — EPHEDRINE SULFATE 50 MG/ML IJ SOLN
INTRAMUSCULAR | Status: DC | PRN
  Administered 2012-04-03: 5 mg via INTRAVENOUS

## 2012-04-03 MED ORDER — TRAMADOL HCL 50 MG PO TABS: 50.0000 mg | ORAL_TABLET | Freq: Four times a day (QID) | ORAL | Status: DC | PRN

## 2012-04-03 SURGICAL SUPPLY — 43 items
ADH SKN CLS APL DERMABOND .7 (GAUZE/BANDAGES/DRESSINGS) ×1
CANISTER SUCTION 2500CC (MISCELLANEOUS) ×2 IMPLANT
CLIP TI MEDIUM 6 (CLIP) ×2 IMPLANT
CLIP TI WIDE RED SMALL 6 (CLIP) ×2 IMPLANT
CLOTH BEACON ORANGE TIMEOUT ST (SAFETY) ×2 IMPLANT
COVER PROBE W GEL 5X96 (DRAPES) ×2 IMPLANT
COVER SURGICAL LIGHT HANDLE (MISCELLANEOUS) ×2 IMPLANT
DECANTER SPIKE VIAL GLASS SM (MISCELLANEOUS) ×2 IMPLANT
DERMABOND ADVANCED (GAUZE/BANDAGES/DRESSINGS) ×1
DERMABOND ADVANCED .7 DNX12 (GAUZE/BANDAGES/DRESSINGS) ×1 IMPLANT
DRAIN PENROSE 1/4X12 LTX STRL (WOUND CARE) ×2 IMPLANT
ELECT REM PT RETURN 9FT ADLT (ELECTROSURGICAL) ×2
ELECTRODE REM PT RTRN 9FT ADLT (ELECTROSURGICAL) ×1 IMPLANT
GAUZE SPONGE 2X2 8PLY STRL LF (GAUZE/BANDAGES/DRESSINGS) ×1 IMPLANT
GEL ULTRASOUND 20GR AQUASONIC (MISCELLANEOUS) IMPLANT
GLOVE BIO SURGEON STRL SZ 6.5 (GLOVE) ×2 IMPLANT
GLOVE BIO SURGEON STRL SZ7.5 (GLOVE) ×2 IMPLANT
GLOVE BIOGEL PI IND STRL 6.5 (GLOVE) IMPLANT
GLOVE BIOGEL PI IND STRL 7.0 (GLOVE) IMPLANT
GLOVE BIOGEL PI INDICATOR 6.5 (GLOVE) ×2
GLOVE BIOGEL PI INDICATOR 7.0 (GLOVE) ×1
GLOVE ECLIPSE 6.5 STRL STRAW (GLOVE) ×1 IMPLANT
GLOVE SURG SS PI 7.0 STRL IVOR (GLOVE) ×1 IMPLANT
GOWN PREVENTION PLUS XLARGE (GOWN DISPOSABLE) ×2 IMPLANT
GOWN STRL NON-REIN LRG LVL3 (GOWN DISPOSABLE) ×4 IMPLANT
GOWN STRL REIN XL XLG (GOWN DISPOSABLE) ×1 IMPLANT
KIT BASIN OR (CUSTOM PROCEDURE TRAY) ×2 IMPLANT
KIT ROOM TURNOVER OR (KITS) ×2 IMPLANT
LOOP VESSEL MINI RED (MISCELLANEOUS) IMPLANT
NS IRRIG 1000ML POUR BTL (IV SOLUTION) ×2 IMPLANT
PACK CV ACCESS (CUSTOM PROCEDURE TRAY) ×2 IMPLANT
PAD ARMBOARD 7.5X6 YLW CONV (MISCELLANEOUS) ×4 IMPLANT
SPONGE GAUZE 2X2 STER 10/PKG (GAUZE/BANDAGES/DRESSINGS) ×1
SPONGE SURGIFOAM ABS GEL 100 (HEMOSTASIS) IMPLANT
SUT PROLENE 6 0 CC (SUTURE) ×1 IMPLANT
SUT PROLENE 7 0 BV 1 (SUTURE) ×2 IMPLANT
SUT VIC AB 3-0 SH 27 (SUTURE) ×2
SUT VIC AB 3-0 SH 27X BRD (SUTURE) ×1 IMPLANT
SUT VICRYL 4-0 PS2 18IN ABS (SUTURE) ×2 IMPLANT
TOWEL OR 17X24 6PK STRL BLUE (TOWEL DISPOSABLE) ×2 IMPLANT
TOWEL OR 17X26 10 PK STRL BLUE (TOWEL DISPOSABLE) ×2 IMPLANT
UNDERPAD 30X30 INCONTINENT (UNDERPADS AND DIAPERS) ×2 IMPLANT
WATER STERILE IRR 1000ML POUR (IV SOLUTION) ×2 IMPLANT

## 2012-04-03 NOTE — Transfer of Care (Signed)
Immediate Anesthesia Transfer of Care Note  Patient: [name]  Procedure(s) Performed: Procedure(s) (LRB) with comments: ARTERIOVENOUS (AV) FISTULA CREATION (Left) - creation left brachial cephalic fistula   Patient Location: PACU  Anesthesia Type:General  Level of Consciousness: awake and alert   Airway & Oxygen Therapy: Patient Spontanous Breathing  Post-op Assessment: Report given to PACU RN  Post vital signs: stable  Complications: No apparent anesthesia complications

## 2012-04-03 NOTE — Op Note (Signed)
Procedure: Left Brachial Cephalic AV fistula  Preop: ESRD  Postop: ESRD  Anesthesia: General  Assistant:Samantha Rhyne PA-C  Findings: 5 mm cephalic vein  Procedure: After obtaining informed consent, the patient was taken to the operating room.  After induction of general anesthesia, the left upper extremity was prepped and draped in usual sterile fashion.  A transverse incision was then made near the antecubital crease the left arm. The incision was carried into the subcutaneous tissues down to level of the cephalic vein. The cephalic vein was approximately 5 mm in diameter. It was of good quality. This was dissected free circumferentially and small side branches ligated and divided between silk ties or clips. Next the brachial artery was dissected free in the medial portion of the incision. The artery was  3-4 mm in diameter. The vessel loops were placed proximal and distal to the planned site of arteriotomy. The patient was given 5000 units of intravenous heparin. After appropriate circulation time, the vessel loops were used to control the artery. A longitudinal opening was made in the brachial artery.  The vein was ligated distally with a 2-0 silk tie. The vein was controlled proximally with a fine bulldog clamp. The vein was then swung over to the artery and sewn end of vein to side of artery using a running 6-0 Prolene suture. Just prior to completion of the anastomosis, everything was fore bled back bled and thoroughly flushed. The anastomosis was secured, vessel loops released, and there was a palpable thrill in the fistula immediately. After hemostasis was obtained, the subcutaneous tissues were reapproximated using a running 3-0 Vicryl suture. The skin was then closed with a 4 Vicryl subcuticular stitch. Dermabond was applied to the skin incision.  The patient had a palpable radial pulse at the end of the case.  Ruta Hinds, MD [facility] [place] Office:  [phone] Pager: [phone]

## 2012-04-03 NOTE — Interval H&P Note (Signed)
History and Physical Interval Note:  04/03/2012 4:02 PM  [name]  has presented today for surgery, with the diagnosis of End Stage Renal Disease  The various methods of treatment have been discussed with the patient and family. After consideration of risks, benefits and other options for treatment, the patient has consented to  Procedure(s) (LRB) with comments: ARTERIOVENOUS (AV) FISTULA CREATION (Left) - creation left brachial cephalic fistula  as a surgical intervention .  The patient's history has been reviewed, patient examined, no change in status, stable for surgery.  I have reviewed the patient's chart and labs.  Questions were answered to the patient's satisfaction.     Saad Buhl E

## 2012-04-03 NOTE — Anesthesia Postprocedure Evaluation (Signed)
  Anesthesia Post-op Note  Patient: [name]  Procedure(s) Performed: Procedure(s) (LRB) with comments: ARTERIOVENOUS (AV) FISTULA CREATION (Left) - creation left brachial cephalic fistula   Patient Location: PACU  Anesthesia Type:MAC  Level of Consciousness: awake, alert , oriented and patient cooperative  Airway and Oxygen Therapy: Patient Spontanous Breathing  Post-op Pain: none  Post-op Assessment: Post-op Vital signs reviewed, Patient's Cardiovascular Status Stable, Respiratory Function Stable, Patent Airway, No signs of Nausea or vomiting and Pain level controlled  Post-op Vital Signs: Reviewed and stable  Complications: No apparent anesthesia complications

## 2012-04-03 NOTE — Anesthesia Preprocedure Evaluation (Addendum)
Anesthesia Evaluation  Patient identified by MRN, date of birth, ID band Patient awake and Patient confused    Reviewed: Allergy & Precautions, H&P , NPO status , Patient's Chart, lab work & pertinent test results, reviewed documented beta blocker date and time   Airway Mallampati: I  Neck ROM: Full    Dental  (+) Edentulous Upper and Edentulous Lower   Pulmonary neg pulmonary ROS,  breath sounds clear to auscultation        Cardiovascular hypertension, Pt. on medications Rhythm:Regular Rate:Normal     Neuro/Psych negative neurological ROS     GI/Hepatic   Endo/Other  diabetes, Well Controlled, Type 2  Renal/GU ESRFRenal disease     Musculoskeletal   Abdominal (+) + obese,  Abdomen: soft. Bowel sounds: normal.  Peds  Hematology   Anesthesia Other Findings   Reproductive/Obstetrics                         Anesthesia Physical Anesthesia Plan  ASA: III  Anesthesia Plan: General   Post-op Pain Management:    Induction: Intravenous  Airway Management Planned: LMA  Additional Equipment:   Intra-op Plan:   Post-operative Plan:   Informed Consent: I have reviewed the patients History and Physical, chart, labs and discussed the procedure including the risks, benefits and alternatives for the proposed anesthesia with the patient or authorized representative who has indicated his/her understanding and acceptance.     Plan Discussed with: CRNA and Surgeon  Anesthesia Plan Comments: (ESRD has not begun HD K-4.7 Type 2 DM glucose 100 htn Obesity  Plan GA with LMA  Roberts Gaudy, MD)        Anesthesia Quick Evaluation

## 2012-04-03 NOTE — Preoperative (Signed)
Beta Blockers   Reason not to administer Beta Blockers:Carvedilol at 0630 hrs 04/03/12

## 2012-04-03 NOTE — Anesthesia Procedure Notes (Signed)
Procedure Name: Intubation Date/Time: 04/03/2012 4:34 PM Performed by: Maeola Harman Pre-anesthesia Checklist: Patient identified, Emergency Drugs available, Suction available, Patient being monitored and Timeout performed Patient Re-evaluated:Patient Re-evaluated prior to inductionOxygen Delivery Method: Circle system utilized Preoxygenation: Pre-oxygenation with 100% oxygen Intubation Type: IV induction Ventilation: Mask ventilation without difficulty Laryngoscope Size: Mac and 3 Grade View: Grade I Tube type: Oral Tube size: 7.5 mm Number of attempts: 1 Airway Equipment and Method: Stylet Placement Confirmation: ETT inserted through vocal cords under direct vision,  positive ETCO2 and breath sounds checked- equal and bilateral Secured at: 22 cm Tube secured with: Tape Dental Injury: Teeth and Oropharynx as per pre-operative assessment  Comments: Attempted LMA #4 insertion with poor seal as pt is edentulous...  DL x 1 with MAC 3 blade ETT secured.  Waldron Session, CRNA

## 2012-04-03 NOTE — H&P (View-Only) (Signed)
[facility] [place] HISTORY AND PHYSICAL   History of Present Illness:  Patient is a 71 y.o. year old female who presents for placement of a permanent hemodialysis access. The patient is right handed .  The patient is not currently on hemodialysis.  The cause of renal failure is thought to be secondary to  Diabetes and hypertension.  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  Past Medical History  Diagnosis Date  . Diabetes mellitus   . Hypertension   . Thyroid disease   . High cholesterol   . Kidney failure     Past Surgical History  Procedure Date  . Joint replacement     bilateral knee  . Joint replacement     shoulder     Social History History  Substance Use Topics  . Smoking status: Former Smoker    Types: Cigarettes  . Smokeless tobacco: Never Used  . Alcohol Use: No    Family History Family History  Problem Relation Age of Onset  . Diabetes Sister   . Cancer Brother   . Hyperlipidemia Daughter   . Hypertension Daughter   . Hypertension Son     Allergies  Allergies  Allergen Reactions  . Sulfa Drugs Cross Reactors Hives and Itching  . Amoxicillin Rash  . Percocet (Oxycodone-Acetaminophen) Itching and Rash     Current Outpatient Prescriptions  Medication Sig Dispense Refill  . acetaminophen (TYLENOL) 500 MG tablet Take 500 mg by mouth every 4 (four) hours as needed. For pain       . amLODipine (NORVASC) 5 MG tablet Take 5 mg by mouth daily.        Marland Kitchen aspirin EC 81 MG tablet Take 81 mg by mouth daily.        . carvedilol (COREG) 12.5 MG tablet Take 12.5 mg by mouth 2 (two) times daily.        . ciprofloxacin (CIPRO) 500 MG tablet Take by mouth.      . colchicine 0.6 MG tablet Take 0.6 mg by mouth daily.        Marland Kitchen ezetimibe (ZETIA) 10 MG tablet Take 10 mg by mouth daily.       Marland Kitchen levothyroxine (SYNTHROID, LEVOTHROID) 100 MCG tablet Take 100 mcg by mouth daily.        . methocarbamol (ROBAXIN) 500 MG  tablet Take 500 mg by mouth every 6 (six) hours as needed. For muscle spasms        . sodium bicarbonate 650 MG tablet Take 650 mg by mouth 2 (two) times daily.        . Vitamin D, Ergocalciferol, (DRISDOL) 50000 UNITS CAPS Take 50,000 Units by mouth every 30 (thirty) days.        Marland Kitchen ZEMPLAR 1 MCG capsule Take 1 capsule by mouth 3 (three) times a week.      . hydrocodone-acetaminophen (LORCET-HD) 5-500 MG per capsule Take 1 capsule by mouth every 6 (six) hours as needed. For pain       . traMADol (ULTRAM) 50 MG tablet Take 1 tablet by mouth as needed.        ROS:   General:  No weight loss, Fever, chills  HEENT: No recent headaches, no nasal bleeding, no visual changes, no sore throat  Neurologic: No dizziness, blackouts, seizures. No recent symptoms of stroke or mini- stroke. No recent episodes of slurred speech, or temporary blindness.  Cardiac: No recent episodes of chest pain/pressure, no shortness of breath at rest.  No shortness of breath with exertion.  Denies history of atrial fibrillation or irregular heartbeat  Vascular: No history of rest pain in feet.  No history of claudication.  No history of non-healing ulcer, No history of DVT   Pulmonary: No home oxygen, no productive cough, no hemoptysis,  No asthma or wheezing  Musculoskeletal:  [ ]  Arthritis, [ ]  Low back pain,  [ ]  Joint pain  Hematologic:No history of hypercoagulable state.  No history of easy bleeding.  No history of anemia  Gastrointestinal: No hematochezia or melena,  No gastroesophageal reflux, no trouble swallowing  Urinary: [x ] chronic Kidney disease, [ ]  on HD - [ ]  MWF or [ ]  TTHS, [ ]  Burning with urination, [ ]  Frequent urination, [ ]  Difficulty urinating;   Skin: No rashes  Psychological: No history of anxiety,  No history of depression   Physical Examination  Filed Vitals:   03/15/12 1528  BP: 127/59  Pulse: 63  Height: 5\' 3"  (1.6 m)  Weight: 244 lb (110.678 kg)  SpO2: 100%    Body mass  index is 43.22 kg/(m^2).  General:  Alert and oriented, no acute distress HEENT: Normal Neck: No bruit or JVD Pulmonary: Clear to auscultation bilaterally Cardiac: Regular Rate and Rhythm without murmur Gastrointestinal: Soft, non-tender, non-distended, no mass, no scars, obese Skin: No rash Extremity Pulses:  2+ radial, brachial pulses bilaterally Musculoskeletal: No deformity or edema  Neurologic: Upper and lower extremity motor 5/5 and symmetric  DATA:   The patient had a vein mapping ultrasound today which are reviewed and interpreted. This shows the cephalic vein on the right side is not visualized. The basilic vein on the right side is 6-9 mm in the upper arm. It is small in the lower arm. On the left side the cephalic vein is too small to be used in the forearm from the antecubital area up it is 7 mm in diameter. The basilic vein is too small on the left.   ASSESSMENT: Needs hemodialysis access.   PLAN:  Left brachiocephalic AV fistula 123456. Risks benefits possible complications and procedure details including but limited to bleeding infection non-maturation of fistula ischemic steal were explained the patient and her family today. She understands and agrees to proceed.  Ruta Hinds, MD [facility] [place] Office: [phone] Pager: [phone]

## 2012-04-05 ENCOUNTER — Telehealth: Payer: Self-pay | Admitting: Vascular Surgery

## 2012-04-05 ENCOUNTER — Encounter (HOSPITAL_COMMUNITY): Payer: Self-pay | Admitting: Vascular Surgery

## 2012-04-05 NOTE — Telephone Encounter (Signed)
Message copied by Gena Fray on Thu Apr 05, 2012  3:34 PM ------      Message from: Peter Minium K      Created: Thu Apr 05, 2012  8:09 AM      Regarding: schedule                   ----- Message -----         From: Gabriel Earing, PA         Sent: 04/03/2012   5:42 PM           To: Mena Goes, CMA            S/p left [facility] AVF by Dr. Oneida Alar.  F/u in 4 weeks with him.            Thanks,      Aldona Bar

## 2012-04-05 NOTE — Telephone Encounter (Signed)
Spoke with pt to notify of f/u appt with [facility], dpm

## 2012-05-02 ENCOUNTER — Encounter: Payer: Self-pay | Admitting: Vascular Surgery

## 2012-05-03 ENCOUNTER — Ambulatory Visit: Payer: PRIVATE HEALTH INSURANCE | Admitting: Vascular Surgery

## 2012-05-17 ENCOUNTER — Ambulatory Visit: Payer: PRIVATE HEALTH INSURANCE | Admitting: Vascular Surgery

## 2012-06-06 ENCOUNTER — Encounter: Payer: Self-pay | Admitting: Vascular Surgery

## 2012-06-07 ENCOUNTER — Ambulatory Visit (INDEPENDENT_AMBULATORY_CARE_PROVIDER_SITE_OTHER): Payer: PRIVATE HEALTH INSURANCE | Admitting: Vascular Surgery

## 2012-06-07 ENCOUNTER — Encounter: Payer: Self-pay | Admitting: Vascular Surgery

## 2012-06-07 VITALS — BP 167/81 | HR 72 | Ht 63.0 in | Wt 244.6 lb

## 2012-06-07 DIAGNOSIS — N186 End stage renal disease: Secondary | ICD-10-CM

## 2012-06-07 DIAGNOSIS — Z4931 Encounter for adequacy testing for hemodialysis: Secondary | ICD-10-CM

## 2012-06-07 NOTE — Progress Notes (Signed)
This is a 72 year old female who had a left brachiocephalic AV fistula placed on December 31. She returns today for further followup. She is currently not on dialysis. She has some occasional numbness and tingling in her left hand but states that this was prior to her fistula placement. She has no drainage from incisions.  Filed Vitals:   06/07/12 1453  BP: 167/81  Pulse: 72  Height: 5\' 3"  (1.6 m)  Weight: 244 lb 9.6 oz (110.95 kg)  SpO2: 100%   Left upper extremity: Palpable thrill easily palpable fistula left upper arm  Assessment: Maturing AV fistula left upper extremity.  Plan: Followup in 2 months with duplex ultrasound to assess maturity at that time.  Ruta Hinds, MD [facility] [place] Office: [phone] Pager: [phone]

## 2012-06-11 NOTE — Addendum Note (Signed)
Addended by: Mena Goes on: 06/11/2012 01:27 PM   Modules accepted: Orders

## 2012-07-06 ENCOUNTER — Encounter (INDEPENDENT_AMBULATORY_CARE_PROVIDER_SITE_OTHER): Payer: PRIVATE HEALTH INSURANCE | Admitting: Ophthalmology

## 2012-07-06 DIAGNOSIS — I10 Essential (primary) hypertension: Secondary | ICD-10-CM

## 2012-07-06 DIAGNOSIS — E1139 Type 2 diabetes mellitus with other diabetic ophthalmic complication: Secondary | ICD-10-CM

## 2012-07-06 DIAGNOSIS — H35039 Hypertensive retinopathy, unspecified eye: Secondary | ICD-10-CM

## 2012-07-06 DIAGNOSIS — H43819 Vitreous degeneration, unspecified eye: Secondary | ICD-10-CM

## 2012-07-06 DIAGNOSIS — H3530 Unspecified macular degeneration: Secondary | ICD-10-CM

## 2012-07-06 DIAGNOSIS — E11319 Type 2 diabetes mellitus with unspecified diabetic retinopathy without macular edema: Secondary | ICD-10-CM

## 2012-08-09 ENCOUNTER — Ambulatory Visit: Payer: PRIVATE HEALTH INSURANCE | Admitting: Vascular Surgery

## 2012-08-15 ENCOUNTER — Encounter: Payer: Self-pay | Admitting: Vascular Surgery

## 2012-08-16 ENCOUNTER — Ambulatory Visit (INDEPENDENT_AMBULATORY_CARE_PROVIDER_SITE_OTHER): Payer: PRIVATE HEALTH INSURANCE | Admitting: Vascular Surgery

## 2012-08-16 ENCOUNTER — Encounter: Payer: Self-pay | Admitting: Vascular Surgery

## 2012-08-16 ENCOUNTER — Encounter (INDEPENDENT_AMBULATORY_CARE_PROVIDER_SITE_OTHER): Payer: PRIVATE HEALTH INSURANCE | Admitting: *Deleted

## 2012-08-16 VITALS — BP 170/91 | HR 69 | Resp 16 | Ht 64.0 in | Wt 242.0 lb

## 2012-08-16 DIAGNOSIS — T82897A Other specified complication of cardiac prosthetic devices, implants and grafts, initial encounter: Secondary | ICD-10-CM

## 2012-08-16 DIAGNOSIS — N186 End stage renal disease: Secondary | ICD-10-CM

## 2012-08-16 DIAGNOSIS — Z4931 Encounter for adequacy testing for hemodialysis: Secondary | ICD-10-CM

## 2012-08-16 NOTE — Progress Notes (Signed)
Patient is a 72 year old female who is status post placement of a left brachiocephalic AV fistula 123456. She presents today for further followup. She denies any numbness or tingling in her hand. She is currently not on dialysis.  Review of systems: She denies any skin itching. She denies shortness of breath. She denies chest pain.  Physical exam:  Filed Vitals:   08/16/12 1049  BP: 170/91  Pulse: 69  Resp: 16  Height: 5\' 4"  (1.626 m)  Weight: 242 lb (109.77 kg)  SpO2: 100%   Left upper extremity: Easily palpable thrill fistula obvious in the left upper arm  Data: Patient had a duplex of AV fistula today. This shows a diameter is 9 mm throughout the course of the fistula. I reviewed and interpreted this study.  Assessment: Mature left upper arm AV fistula  Plan: followup when necessary fistula is ready for use at any time  Ruta Hinds, MD [facility] [place] Office: [phone] Pager: [phone]

## 2013-01-17 ENCOUNTER — Ambulatory Visit
Admission: RE | Admit: 2013-01-17 | Discharge: 2013-01-17 | Disposition: A | Discharge status: Home / Self Care | Payer: PRIVATE HEALTH INSURANCE | Source: Ambulatory Visit | Attending: Nephrology | Admitting: Nephrology

## 2013-01-17 ENCOUNTER — Other Ambulatory Visit: Payer: Self-pay | Admitting: Nephrology

## 2013-01-17 DIAGNOSIS — M549 Dorsalgia, unspecified: Secondary | ICD-10-CM

## 2013-06-28 ENCOUNTER — Encounter (HOSPITAL_COMMUNITY): Payer: Self-pay | Admitting: Emergency Medicine

## 2013-06-28 ENCOUNTER — Emergency Department (HOSPITAL_COMMUNITY): Payer: PRIVATE HEALTH INSURANCE

## 2013-06-28 ENCOUNTER — Emergency Department (HOSPITAL_COMMUNITY)
Admission: EM | Admit: 2013-06-28 | Discharge: 2013-06-28 | Disposition: A | Discharge status: Home / Self Care | Payer: PRIVATE HEALTH INSURANCE | Attending: Emergency Medicine | Admitting: Emergency Medicine

## 2013-06-28 DIAGNOSIS — Z7982 Long term (current) use of aspirin: Secondary | ICD-10-CM | POA: Insufficient documentation

## 2013-06-28 DIAGNOSIS — Z96659 Presence of unspecified artificial knee joint: Secondary | ICD-10-CM | POA: Insufficient documentation

## 2013-06-28 DIAGNOSIS — E669 Obesity, unspecified: Secondary | ICD-10-CM | POA: Insufficient documentation

## 2013-06-28 DIAGNOSIS — M129 Arthropathy, unspecified: Secondary | ICD-10-CM | POA: Insufficient documentation

## 2013-06-28 DIAGNOSIS — S8010XA Contusion of unspecified lower leg, initial encounter: Secondary | ICD-10-CM | POA: Insufficient documentation

## 2013-06-28 DIAGNOSIS — E119 Type 2 diabetes mellitus without complications: Secondary | ICD-10-CM | POA: Insufficient documentation

## 2013-06-28 DIAGNOSIS — IMO0002 Reserved for concepts with insufficient information to code with codable children: Secondary | ICD-10-CM | POA: Insufficient documentation

## 2013-06-28 DIAGNOSIS — Z87891 Personal history of nicotine dependence: Secondary | ICD-10-CM | POA: Insufficient documentation

## 2013-06-28 DIAGNOSIS — Z79899 Other long term (current) drug therapy: Secondary | ICD-10-CM | POA: Insufficient documentation

## 2013-06-28 DIAGNOSIS — Z87448 Personal history of other diseases of urinary system: Secondary | ICD-10-CM | POA: Insufficient documentation

## 2013-06-28 DIAGNOSIS — I10 Essential (primary) hypertension: Secondary | ICD-10-CM | POA: Insufficient documentation

## 2013-06-28 DIAGNOSIS — E079 Disorder of thyroid, unspecified: Secondary | ICD-10-CM | POA: Insufficient documentation

## 2013-06-28 DIAGNOSIS — Y929 Unspecified place or not applicable: Secondary | ICD-10-CM | POA: Insufficient documentation

## 2013-06-28 DIAGNOSIS — Y939 Activity, unspecified: Secondary | ICD-10-CM | POA: Insufficient documentation

## 2013-06-28 DIAGNOSIS — S8012XA Contusion of left lower leg, initial encounter: Secondary | ICD-10-CM

## 2013-06-28 MED ORDER — IBUPROFEN 800 MG PO TABS: 800.0000 mg | ORAL_TABLET | Freq: Once | ORAL | Status: DC

## 2013-06-28 MED ORDER — HYDROCODONE-ACETAMINOPHEN 5-325 MG PO TABS
2.0000 | ORAL_TABLET | Freq: Once | ORAL | Status: AC
  Administered 2013-06-28: 2 via ORAL
  Filled 2013-06-28: qty 2

## 2013-06-28 NOTE — ED Provider Notes (Signed)
Medical screening examination/treatment/procedure(s) were performed by non-physician practitioner and as supervising physician I was immediately available for consultation/collaboration.   EKG Interpretation None       Varney Biles, MD 06/28/13 [phone]
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
## 2013-06-28 NOTE — Discharge Instructions (Signed)
Take vicodin as prescribed for severe pain.  Do not drive within four hours of taking this medication (may cause drowsiness or confusion).    Ice 2-3 times a day for 15-20 minutes.  Elevate as often as possible.  Activity as tolerated.  If no improvement in symptoms by Monday, follow up with your primary care doctor.   You may return to the ER if symptoms worsen or you have any other concerns.
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
## 2013-07-11 ENCOUNTER — Other Ambulatory Visit (HOSPITAL_COMMUNITY): Payer: Self-pay | Admitting: *Deleted

## 2013-07-12 ENCOUNTER — Encounter (HOSPITAL_COMMUNITY): Payer: Self-pay | Admitting: Emergency Medicine

## 2013-07-12 ENCOUNTER — Ambulatory Visit (HOSPITAL_COMMUNITY)
Admission: RE | Admit: 2013-07-12 | Discharge: 2013-07-12 | Disposition: A | Discharge status: Home / Self Care | Payer: PRIVATE HEALTH INSURANCE | Source: Ambulatory Visit | Attending: Family Medicine | Admitting: Family Medicine

## 2013-07-12 ENCOUNTER — Emergency Department (HOSPITAL_COMMUNITY): Payer: PRIVATE HEALTH INSURANCE

## 2013-07-12 ENCOUNTER — Emergency Department (HOSPITAL_COMMUNITY)
Admission: EM | Admit: 2013-07-12 | Discharge: 2013-07-12 | Disposition: A | Discharge status: Home / Self Care | Payer: PRIVATE HEALTH INSURANCE | Attending: Emergency Medicine | Admitting: Emergency Medicine

## 2013-07-12 DIAGNOSIS — Z79899 Other long term (current) drug therapy: Secondary | ICD-10-CM | POA: Insufficient documentation

## 2013-07-12 DIAGNOSIS — Z7982 Long term (current) use of aspirin: Secondary | ICD-10-CM | POA: Insufficient documentation

## 2013-07-12 DIAGNOSIS — E119 Type 2 diabetes mellitus without complications: Secondary | ICD-10-CM | POA: Insufficient documentation

## 2013-07-12 DIAGNOSIS — G8911 Acute pain due to trauma: Secondary | ICD-10-CM | POA: Insufficient documentation

## 2013-07-12 DIAGNOSIS — R231 Pallor: Secondary | ICD-10-CM | POA: Insufficient documentation

## 2013-07-12 DIAGNOSIS — D649 Anemia, unspecified: Secondary | ICD-10-CM

## 2013-07-12 DIAGNOSIS — M129 Arthropathy, unspecified: Secondary | ICD-10-CM | POA: Insufficient documentation

## 2013-07-12 DIAGNOSIS — N189 Chronic kidney disease, unspecified: Secondary | ICD-10-CM

## 2013-07-12 DIAGNOSIS — M79609 Pain in unspecified limb: Secondary | ICD-10-CM | POA: Insufficient documentation

## 2013-07-12 DIAGNOSIS — E7800 Pure hypercholesterolemia, unspecified: Secondary | ICD-10-CM | POA: Insufficient documentation

## 2013-07-12 DIAGNOSIS — I129 Hypertensive chronic kidney disease with stage 1 through stage 4 chronic kidney disease, or unspecified chronic kidney disease: Secondary | ICD-10-CM | POA: Insufficient documentation

## 2013-07-12 DIAGNOSIS — Z87891 Personal history of nicotine dependence: Secondary | ICD-10-CM | POA: Insufficient documentation

## 2013-07-12 DIAGNOSIS — E079 Disorder of thyroid, unspecified: Secondary | ICD-10-CM | POA: Insufficient documentation

## 2013-07-12 DIAGNOSIS — S8012XA Contusion of left lower leg, initial encounter: Secondary | ICD-10-CM

## 2013-07-12 LAB — CBC WITH DIFFERENTIAL/PLATELET
Basophils Absolute: 0.1 10*3/uL (ref 0.0–0.1)
Basophils Relative: 1 % (ref 0–1)
Eosinophils Absolute: 0.4 10*3/uL (ref 0.0–0.7)
Eosinophils Relative: 4 % (ref 0–5)
HCT: 22.9 % — ABNORMAL LOW (ref 36.0–46.0)
Hemoglobin: 7.3 g/dL — ABNORMAL LOW (ref 12.0–15.0)
Lymphocytes Relative: 13 % (ref 12–46)
Lymphs Abs: 1.1 10*3/uL (ref 0.7–4.0)
MCH: 28.5 pg (ref 26.0–34.0)
MCHC: 31.9 g/dL (ref 30.0–36.0)
MCV: 89.5 fL (ref 78.0–100.0)
Monocytes Absolute: 0.8 10*3/uL (ref 0.1–1.0)
Monocytes Relative: 10 % (ref 3–12)
Neutro Abs: 5.8 10*3/uL (ref 1.7–7.7)
Neutrophils Relative %: 72 % (ref 43–77)
Platelets: 349 10*3/uL (ref 150–400)
RBC: 2.56 MIL/uL — ABNORMAL LOW (ref 3.87–5.11)
RDW: 15.3 % (ref 11.5–15.5)
WBC: 8.1 10*3/uL (ref 4.0–10.5)

## 2013-07-12 LAB — I-STAT CHEM 8, ED
BUN: 40 mg/dL — ABNORMAL HIGH (ref 6–23)
Calcium, Ion: 1.08 mmol/L — ABNORMAL LOW (ref 1.13–1.30)
Chloride: 116 mEq/L — ABNORMAL HIGH (ref 96–112)
Creatinine, Ser: 4.2 mg/dL — ABNORMAL HIGH (ref 0.50–1.10)
Glucose, Bld: 112 mg/dL — ABNORMAL HIGH (ref 70–99)
HCT: 24 % — ABNORMAL LOW (ref 36.0–46.0)
Hemoglobin: 8.2 g/dL — ABNORMAL LOW (ref 12.0–15.0)
Potassium: 4.3 mEq/L (ref 3.7–5.3)
Sodium: 143 mEq/L (ref 137–147)
TCO2: 18 mmol/L (ref 0–100)

## 2013-07-12 LAB — PREPARE RBC (CROSSMATCH)
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
## 2013-07-12 NOTE — ED Notes (Signed)
Ace wrap applied to patient's left lower leg per Dr. Maryan Rued.

## 2013-07-12 NOTE — ED Notes (Signed)
Pt was to get blood transfusion in short stay but was sent here because of leg pain. Will give blood transfusion down here. Dr. Maryan Rued using [place] to examine pts left leg.

## 2013-07-12 NOTE — Discharge Instructions (Signed)

## 2013-07-12 NOTE — Progress Notes (Signed)
Pt complained of pain in left left.  Left leg is swollen, bruised, pt is unable to walk on it, it has two large knots on it, and is hot to the touch.  I called and reported the above to Dr. Murrell Redden nurse, Drue Dun, who spoke with Dr Edrick Oh and stated to send her to the ER to be evaluated.

## 2013-07-14 LAB — TYPE AND SCREEN
ABO/RH(D): O POS
Antibody Screen: NEGATIVE
Unit division: 0

## 2013-07-18 DIAGNOSIS — T148XXA Other injury of unspecified body region, initial encounter: Secondary | ICD-10-CM | POA: Insufficient documentation

## 2013-07-26 ENCOUNTER — Encounter (HOSPITAL_COMMUNITY): Payer: Self-pay | Admitting: Emergency Medicine

## 2013-07-26 ENCOUNTER — Inpatient Hospital Stay (HOSPITAL_COMMUNITY)
Admission: EM | Admit: 2013-07-26 | Discharge: 2013-07-30 | DRG: 683 | Disposition: A | Discharge status: Skilled Nursing Facility | Payer: PRIVATE HEALTH INSURANCE | Attending: Family Medicine | Admitting: Family Medicine

## 2013-07-26 ENCOUNTER — Inpatient Hospital Stay (HOSPITAL_COMMUNITY): Payer: PRIVATE HEALTH INSURANCE

## 2013-07-26 DIAGNOSIS — T148XXA Other injury of unspecified body region, initial encounter: Secondary | ICD-10-CM

## 2013-07-26 DIAGNOSIS — N170 Acute kidney failure with tubular necrosis: Principal | ICD-10-CM | POA: Diagnosis present

## 2013-07-26 DIAGNOSIS — I10 Essential (primary) hypertension: Secondary | ICD-10-CM

## 2013-07-26 DIAGNOSIS — IMO0002 Reserved for concepts with insufficient information to code with codable children: Secondary | ICD-10-CM | POA: Diagnosis present

## 2013-07-26 DIAGNOSIS — E1122 Type 2 diabetes mellitus with diabetic chronic kidney disease: Secondary | ICD-10-CM | POA: Diagnosis present

## 2013-07-26 DIAGNOSIS — Z96619 Presence of unspecified artificial shoulder joint: Secondary | ICD-10-CM

## 2013-07-26 DIAGNOSIS — Z96659 Presence of unspecified artificial knee joint: Secondary | ICD-10-CM

## 2013-07-26 DIAGNOSIS — Z6839 Body mass index (BMI) 39.0-39.9, adult: Secondary | ICD-10-CM

## 2013-07-26 DIAGNOSIS — Z87891 Personal history of nicotine dependence: Secondary | ICD-10-CM

## 2013-07-26 DIAGNOSIS — E872 Acidosis, unspecified: Secondary | ICD-10-CM | POA: Diagnosis present

## 2013-07-26 DIAGNOSIS — Z9849 Cataract extraction status, unspecified eye: Secondary | ICD-10-CM

## 2013-07-26 DIAGNOSIS — N186 End stage renal disease: Secondary | ICD-10-CM | POA: Diagnosis present

## 2013-07-26 DIAGNOSIS — M129 Arthropathy, unspecified: Secondary | ICD-10-CM | POA: Diagnosis present

## 2013-07-26 DIAGNOSIS — E669 Obesity, unspecified: Secondary | ICD-10-CM | POA: Diagnosis present

## 2013-07-26 DIAGNOSIS — E875 Hyperkalemia: Secondary | ICD-10-CM | POA: Diagnosis present

## 2013-07-26 DIAGNOSIS — E1129 Type 2 diabetes mellitus with other diabetic kidney complication: Secondary | ICD-10-CM | POA: Diagnosis present

## 2013-07-26 DIAGNOSIS — I120 Hypertensive chronic kidney disease with stage 5 chronic kidney disease or end stage renal disease: Secondary | ICD-10-CM | POA: Diagnosis present

## 2013-07-26 DIAGNOSIS — Z79899 Other long term (current) drug therapy: Secondary | ICD-10-CM

## 2013-07-26 DIAGNOSIS — D62 Acute posthemorrhagic anemia: Secondary | ICD-10-CM | POA: Diagnosis present

## 2013-07-26 DIAGNOSIS — I498 Other specified cardiac arrhythmias: Secondary | ICD-10-CM | POA: Diagnosis present

## 2013-07-26 DIAGNOSIS — N185 Chronic kidney disease, stage 5: Secondary | ICD-10-CM | POA: Diagnosis present

## 2013-07-26 DIAGNOSIS — Z66 Do not resuscitate: Secondary | ICD-10-CM | POA: Diagnosis present

## 2013-07-26 DIAGNOSIS — S8010XA Contusion of unspecified lower leg, initial encounter: Secondary | ICD-10-CM | POA: Diagnosis present

## 2013-07-26 DIAGNOSIS — D638 Anemia in other chronic diseases classified elsewhere: Secondary | ICD-10-CM | POA: Diagnosis present

## 2013-07-26 DIAGNOSIS — N19 Unspecified kidney failure: Secondary | ICD-10-CM

## 2013-07-26 DIAGNOSIS — E785 Hyperlipidemia, unspecified: Secondary | ICD-10-CM | POA: Diagnosis present

## 2013-07-26 DIAGNOSIS — Z7982 Long term (current) use of aspirin: Secondary | ICD-10-CM

## 2013-07-26 DIAGNOSIS — Z992 Dependence on renal dialysis: Secondary | ICD-10-CM | POA: Diagnosis present

## 2013-07-26 DIAGNOSIS — N2581 Secondary hyperparathyroidism of renal origin: Secondary | ICD-10-CM | POA: Diagnosis present

## 2013-07-26 DIAGNOSIS — Z8249 Family history of ischemic heart disease and other diseases of the circulatory system: Secondary | ICD-10-CM

## 2013-07-26 DIAGNOSIS — Z833 Family history of diabetes mellitus: Secondary | ICD-10-CM

## 2013-07-26 LAB — CBC WITH DIFFERENTIAL/PLATELET
Basophils Absolute: 0.1 10*3/uL (ref 0.0–0.1)
Basophils Relative: 1 % (ref 0–1)
Eosinophils Absolute: 0.7 10*3/uL (ref 0.0–0.7)
Eosinophils Relative: 8 % — ABNORMAL HIGH (ref 0–5)
HCT: 29.2 % — ABNORMAL LOW (ref 36.0–46.0)
Hemoglobin: 9.1 g/dL — ABNORMAL LOW (ref 12.0–15.0)
Lymphocytes Relative: 14 % (ref 12–46)
Lymphs Abs: 1.2 10*3/uL (ref 0.7–4.0)
MCH: 28.2 pg (ref 26.0–34.0)
MCHC: 31.2 g/dL (ref 30.0–36.0)
MCV: 90.4 fL (ref 78.0–100.0)
Monocytes Absolute: 0.8 10*3/uL (ref 0.1–1.0)
Monocytes Relative: 9 % (ref 3–12)
Neutro Abs: 6.2 10*3/uL (ref 1.7–7.7)
Neutrophils Relative %: 68 % (ref 43–77)
Platelets: 296 10*3/uL (ref 150–400)
RBC: 3.23 MIL/uL — ABNORMAL LOW (ref 3.87–5.11)
RDW: 16.3 % — ABNORMAL HIGH (ref 11.5–15.5)
WBC: 9 10*3/uL (ref 4.0–10.5)

## 2013-07-26 LAB — URINALYSIS, ROUTINE W REFLEX MICROSCOPIC
Bilirubin Urine: NEGATIVE
Glucose, UA: NEGATIVE mg/dL
Ketones, ur: NEGATIVE mg/dL
Nitrite: NEGATIVE
Protein, ur: 100 mg/dL — AB
Specific Gravity, Urine: 1.016 (ref 1.005–1.030)
Urobilinogen, UA: 1 mg/dL (ref 0.0–1.0)
pH: 7 (ref 5.0–8.0)

## 2013-07-26 LAB — RENAL FUNCTION PANEL
Albumin: 2.7 g/dL — ABNORMAL LOW (ref 3.5–5.2)
BUN: 46 mg/dL — ABNORMAL HIGH (ref 6–23)
CO2: 19 mEq/L (ref 19–32)
Calcium: 8 mg/dL — ABNORMAL LOW (ref 8.4–10.5)
Chloride: 113 mEq/L — ABNORMAL HIGH (ref 96–112)
Creatinine, Ser: 3.82 mg/dL — ABNORMAL HIGH (ref 0.50–1.10)
GFR calc Af Amer: 12 mL/min — ABNORMAL LOW (ref >90)
GFR calc non Af Amer: 11 mL/min — ABNORMAL LOW (ref >90)
Glucose, Bld: 85 mg/dL (ref 70–99)
Phosphorus: 4.6 mg/dL (ref 2.3–4.6)
Potassium: 5 mEq/L (ref 3.7–5.3)
Sodium: 145 mEq/L (ref 137–147)

## 2013-07-26 LAB — BASIC METABOLIC PANEL
BUN: 52 mg/dL — ABNORMAL HIGH (ref 6–23)
CO2: 20 mEq/L (ref 19–32)
Calcium: 9 mg/dL (ref 8.4–10.5)
Chloride: 105 mEq/L (ref 96–112)
Creatinine, Ser: 4.23 mg/dL — ABNORMAL HIGH (ref 0.50–1.10)
GFR calc Af Amer: 11 mL/min — ABNORMAL LOW (ref >90)
GFR calc non Af Amer: 10 mL/min — ABNORMAL LOW (ref >90)
Glucose, Bld: 113 mg/dL — ABNORMAL HIGH (ref 70–99)
Potassium: 6.4 mEq/L — ABNORMAL HIGH (ref 3.7–5.3)
Sodium: 140 mEq/L (ref 137–147)

## 2013-07-26 LAB — TYPE AND SCREEN
ABO/RH(D): O POS
Antibody Screen: NEGATIVE

## 2013-07-26 LAB — URINE MICROSCOPIC-ADD ON

## 2013-07-26 LAB — I-STAT TROPONIN, ED: Troponin i, poc: 0.02 ng/mL (ref 0.00–0.08)

## 2013-07-26 LAB — TSH: TSH: 2.43 u[IU]/mL (ref 0.350–4.500)

## 2013-07-26 LAB — SODIUM, URINE, RANDOM: Sodium, Ur: 120 mEq/L

## 2013-07-26 LAB — CREATININE, URINE, RANDOM: Creatinine, Urine: 47.99 mg/dL

## 2013-07-26 MED ORDER — SODIUM POLYSTYRENE SULFONATE 15 GM/60ML PO SUSP
15.0000 g | Freq: Once | ORAL | Status: AC
  Administered 2013-07-26: 15 g via ORAL
  Filled 2013-07-26: qty 60

## 2013-07-26 MED ORDER — LEVOTHYROXINE SODIUM 100 MCG PO TABS
100.0000 ug | ORAL_TABLET | Freq: Every day | ORAL | Status: DC
  Administered 2013-07-27 – 2013-07-30 (×4): 100 ug via ORAL
  Filled 2013-07-26 (×5): qty 1

## 2013-07-26 MED ORDER — SODIUM CHLORIDE 0.9 % IV BOLUS (SEPSIS)
500.0000 mL | Freq: Once | INTRAVENOUS | Status: AC
  Administered 2013-07-26: 500 mL via INTRAVENOUS

## 2013-07-26 MED ORDER — ASPIRIN EC 81 MG PO TBEC
81.0000 mg | DELAYED_RELEASE_TABLET | Freq: Every day | ORAL | Status: DC
  Administered 2013-07-27 – 2013-07-30 (×4): 81 mg via ORAL
  Filled 2013-07-26 (×4): qty 1

## 2013-07-26 MED ORDER — SODIUM CHLORIDE 0.9 % IV SOLN
INTRAVENOUS | Status: AC
  Administered 2013-07-26: 14:00:00 via INTRAVENOUS

## 2013-07-26 MED ORDER — SODIUM CHLORIDE 0.9 % IV SOLN
INTRAVENOUS | Status: DC
  Administered 2013-07-27 (×2): via INTRAVENOUS

## 2013-07-26 MED ORDER — ENOXAPARIN SODIUM 30 MG/0.3ML ~~LOC~~ SOLN
30.0000 mg | SUBCUTANEOUS | Status: DC
  Administered 2013-07-26: 30 mg via SUBCUTANEOUS
  Filled 2013-07-26 (×2): qty 0.3

## 2013-07-26 MED ORDER — HYDROCODONE-ACETAMINOPHEN 5-325 MG PO TABS
1.0000 | ORAL_TABLET | Freq: Four times a day (QID) | ORAL | Status: DC | PRN
  Administered 2013-07-28 – 2013-07-29 (×4): 1 via ORAL
  Filled 2013-07-26 (×4): qty 1

## 2013-07-26 MED ORDER — SODIUM CHLORIDE 0.9 % IJ SOLN
3.0000 mL | Freq: Two times a day (BID) | INTRAMUSCULAR | Status: DC
  Administered 2013-07-29: 3 mL via INTRAVENOUS

## 2013-07-26 MED ORDER — CIPROFLOXACIN HCL 250 MG PO TABS
500.0000 mg | ORAL_TABLET | ORAL | Status: DC
  Administered 2013-07-26 – 2013-07-27 (×2): 500 mg via ORAL
  Filled 2013-07-26 (×3): qty 2

## 2013-07-26 MED ORDER — EZETIMIBE 10 MG PO TABS
10.0000 mg | ORAL_TABLET | Freq: Every day | ORAL | Status: DC
  Administered 2013-07-27 – 2013-07-30 (×4): 10 mg via ORAL
  Filled 2013-07-26 (×4): qty 1

## 2013-07-26 MED ORDER — DOXYCYCLINE HYCLATE 100 MG PO TABS
100.0000 mg | ORAL_TABLET | Freq: Two times a day (BID) | ORAL | Status: DC
  Administered 2013-07-26 – 2013-07-27 (×3): 100 mg via ORAL
  Filled 2013-07-26 (×5): qty 1

## 2013-07-26 MED ORDER — SODIUM CHLORIDE 0.9 % IV SOLN
1.0000 g | Freq: Once | INTRAVENOUS | Status: AC
  Administered 2013-07-26: 1 g via INTRAVENOUS
  Filled 2013-07-26: qty 10

## 2013-07-26 MED ORDER — SODIUM BICARBONATE 8.4 % IV SOLN
50.0000 meq | Freq: Once | INTRAVENOUS | Status: AC
  Administered 2013-07-26: 50 meq via INTRAVENOUS
  Filled 2013-07-26: qty 50

## 2013-07-26 MED ORDER — AMLODIPINE BESYLATE 5 MG PO TABS
5.0000 mg | ORAL_TABLET | Freq: Every day | ORAL | Status: DC
  Administered 2013-07-27 – 2013-07-29 (×3): 5 mg via ORAL
  Filled 2013-07-26 (×3): qty 1

## 2013-07-26 NOTE — ED Notes (Signed)
Attempted Report 

## 2013-07-26 NOTE — ED Notes (Addendum)
Admitting MD at bedside.
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
## 2013-07-26 NOTE — Progress Notes (Signed)
Pt gone down for Renal US and doppler via wheelchair.
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
## 2013-07-26 NOTE — ED Notes (Signed)
Per pt's [facility] taken Yesterday 07/25/2013:  BUN 49 Creatinine 4.16 Potassium 5.9 Hgb 8.6 Hct 28.2

## 2013-07-26 NOTE — ED Provider Notes (Signed)
CSN: [id number]     Arrival date & time 07/26/13  1108 History   First MD Initiated Contact with Patient 07/26/13 1120     Chief Complaint  Patient presents with  . Referral     (Consider location/radiation/quality/duration/timing/severity/associated sxs/prior Treatment) The history is provided by the patient.  [name] is a 73 y.o. female hx of HL, HTN, DM renal failure here with abnormal blood work. She is chronically weak and tired. She had a left leg hematoma about 2 weeks ago. She came to the ER and was transfused 1 U PRBC and was sent home. She states that she has been feeling about the same. Denies chest pain or shortness of breath. States that hematoma has improved. Denies abdominal pain or black stools or vomiting. States that she has been urinating about the same. Went to Dr. Edrick Oh for routine blood draw 2 days ago and had Cr. 4.7, K 5.9, Hg 8.9.     Past Medical History  Diagnosis Date  . Thyroid disease   . High cholesterol   . Hypertension     sees Dr. Dione Housekeeper  . Diabetes mellitus     "diet controlled"  . Kidney failure     sees Dr. Justin Mend  . Arthritis    Past Surgical History  Procedure Laterality Date  . Joint replacement      bilateral knee  . Joint replacement      shoulder  . Total knee arthroplasty      right knee  . Knee surgery      left  . Shoulder surgery      right shoulder  . Eye surgery      bilateral cataract removal  . Dilation and curettage of uterus    . Tubal ligation    . Av fistula placement  04/03/2012    Procedure: ARTERIOVENOUS (AV) FISTULA CREATION;  Surgeon: Elam Dutch, MD;  Location: [facility] OR;  Service: Vascular;  Laterality: Left;  creation left brachial cephalic fistula    Family History  Problem Relation Age of Onset  . Diabetes Sister   . Cancer Brother   . Hyperlipidemia Daughter   . Hypertension Daughter   . Hypertension Son    History  Substance Use Topics  . Smoking status: Former Smoker    Types:  Cigarettes  . Smokeless tobacco: Never Used     Comment: "stopped 5 years ago"  . Alcohol Use: No   OB History   Grav Para Term Preterm Abortions TAB SAB Ect Mult Living                 Review of Systems  Neurological: Positive for weakness.  All other systems reviewed and are negative.     Allergies  Sulfa drugs cross reactors; Amoxicillin; and Percocet  Home Medications   Prior to Admission medications   Medication Sig Start Date End Date Taking? Authorizing Provider  acetaminophen (TYLENOL) 325 MG tablet Take 650 mg by mouth every 6 (six) hours as needed for mild pain.    Historical Provider, MD  amLODipine (NORVASC) 5 MG tablet Take 5 mg by mouth daily.      Historical Provider, MD  aspirin EC 81 MG tablet Take 81 mg by mouth daily.      Historical Provider, MD  carvedilol (COREG) 12.5 MG tablet Take 12.5 mg by mouth 2 (two) times daily.      Historical Provider, MD  colchicine 0.6 MG tablet Take 0.6 mg by mouth  daily.      Historical Provider, MD  ezetimibe (ZETIA) 10 MG tablet Take 10 mg by mouth daily.     Historical Provider, MD  HYDROcodone-acetaminophen (NORCO/VICODIN) 5-325 MG per tablet Take 1 tablet by mouth every 6 (six) hours as needed for moderate pain.    Historical Provider, MD  levothyroxine (SYNTHROID, LEVOTHROID) 100 MCG tablet Take 100 mcg by mouth daily.      Historical Provider, MD  sodium bicarbonate 650 MG tablet Take 650 mg by mouth 2 (two) times daily.      Historical Provider, MD  Vitamin D, Ergocalciferol, (DRISDOL) 50000 UNITS CAPS Take 50,000 Units by mouth every 30 (thirty) days.      Historical Provider, MD  ZEMPLAR 1 MCG capsule Take 1 capsule by mouth every Monday, Wednesday, and Friday.  02/27/12   Historical Provider, MD   BP 157/64  Pulse 66  Temp(Src) 98.6 F (37 C) (Oral)  Resp 17  Ht 5\' 3"  (1.6 m)  Wt 220 lb (99.791 kg)  BMI 38.98 kg/m2  SpO2 100% Physical Exam  Nursing note and vitals reviewed. Constitutional: She is oriented  to person, place, and time.  Chronically ill, NAD   HENT:  Head: Normocephalic.  Mouth/Throat: Oropharynx is clear and moist.  Eyes: EOM are normal. Pupils are equal, round, and reactive to light.  Conjunctiva slightly pale   Neck: Normal range of motion. Neck supple.  Cardiovascular: Normal rate, regular rhythm and normal heart sounds.   Pulmonary/Chest: Effort normal and breath sounds normal. No respiratory distress. She has no wheezes. She has no rales.  Abdominal: Soft. Bowel sounds are normal. She exhibits no distension. There is no tenderness. There is no rebound and no guarding.  Musculoskeletal: Normal range of motion. She exhibits no edema and no tenderness.  L tibial hematoma present, no active bleeding. Per daughter, hematoma has decreased   Neurological: She is alert and oriented to person, place, and time. No cranial nerve deficit. Coordination normal.  Skin: Skin is warm and dry.  Psychiatric: She has a normal mood and affect. Her behavior is normal. Judgment and thought content normal.    ED Course  Procedures (including critical care time) CRITICAL CARE Performed by: Wandra Arthurs   Total critical care time: 30 min   Critical care time was exclusive of separately billable procedures and treating other patients.  Critical care was necessary to treat or prevent imminent or life-threatening deterioration.  Critical care was time spent personally by me on the following activities: development of treatment plan with patient and/or surrogate as well as nursing, discussions with consultants, evaluation of patient's response to treatment, examination of patient, obtaining history from patient or surrogate, ordering and performing treatments and interventions, ordering and review of laboratory studies, ordering and review of radiographic studies, pulse oximetry and re-evaluation of patient's condition.   Labs Review Labs Reviewed  CBC WITH DIFFERENTIAL - Abnormal; Notable for  the following:    RBC 3.23 (*)    Hemoglobin 9.1 (*)    HCT 29.2 (*)    RDW 16.3 (*)    Eosinophils Relative 8 (*)    All other components within normal limits  BASIC METABOLIC PANEL - Abnormal; Notable for the following:    Potassium 6.4 (*)    Glucose, Bld 113 (*)    BUN 52 (*)    Creatinine, Ser 4.23 (*)    GFR calc non Af Amer 10 (*)    GFR calc Af Amer 11 (*)  All other components within normal limits  URINALYSIS, ROUTINE W REFLEX MICROSCOPIC - Abnormal; Notable for the following:    Hgb urine dipstick SMALL (*)    Protein, ur 100 (*)    Leukocytes, UA SMALL (*)    All other components within normal limits  URINE MICROSCOPIC-ADD ON - Abnormal; Notable for the following:    Squamous Epithelial / LPF FEW (*)    All other components within normal limits  I-STAT TROPOININ, ED  TYPE AND SCREEN    Imaging Review No results found.   EKG Interpretation   Date/Time:  Friday July 26 2013 11:43:55 EDT Ventricular Rate:  62 PR Interval:  158 QRS Duration: 101 QT Interval:  440 QTC Calculation: 447 R Axis:   -4 Text Interpretation:  Sinus rhythm Left ventricular hypertrophy Baseline  wander in lead(s) II III aVF No significant change since last tracing  Confirmed by Riot Waterworth  MD, Dalena Plantz ([id number]) on 07/26/2013 11:47:28 AM      MDM   Final diagnoses:  None   [name] is a 73 y.o. female here with chronic weakness and abnormal Cr and K. Will get ekg given possible hyperkalemia. Will repeat labs. Consider lab error vs worsening renal failure.   1:30 PM Cr. 4.2, K 6.4 not hemolyzed. No obvious EKG changes. Given ca, bicarb, kayexelate. Also given IVF. I called Dr. Caprice Kluver, who will follow patient. Will admit to tele.    Wandra Arthurs, MD 07/26/13 1332

## 2013-07-26 NOTE — Consult Note (Signed)
Reason for Consult:AKI/CKD and hyperkalemia Referring Physician: Mingo Amber, MD  [name] is an 73 y.o. female.  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  The patient was seen in the ED on 07/12/13 after a fall and noted to have a hematoma on her leg.  She was also noted to have anemia and was transfused in the ED on the same day for a Hgb of 7.3.  The pt returns today due to abnormal labs drawn at there PCP's office.  We were asked to see the patient due to hyperkalemia and AKI/CKD.  Pt last saw Dr. Justin Mend on 04/24/13 and was noted to have a baseline Scr of 3.4.  She overall has been feeling fatigued with poor appetite but denies any recent N/V/D/CP/SOB/NSAIDs/COX-II I's/hematuria/pyuria/dysuria.   Of note she has had an AVF that was placed in 04/03/12 and is functional.  Her trend in Scr is seen below.   Trend in Creatinine: Creatinine, Ser  Date/Time Value Ref Range Status  07/26/2013 11:45 AM 4.23* 0.50 - 1.10 mg/dL Final  07/12/2013  9:39 AM 4.20* 0.50 - 1.10 mg/dL Final  04/03/2012  8:41 AM 2.90* 0.50 - 1.10 mg/dL Final  02/03/2011  4:18 AM 3.18* 0.50 - 1.10 mg/dL Final  02/02/2011  5:45 AM 3.04* 0.50 - 1.10 mg/dL Final  02/01/2011 10:23 PM 3.08* 0.50 - 1.10 mg/dL Final  04/21/2010  2:46 AM 3.09* 0.4 - 1.2 mg/dL Final  03/07/2010  5:25 AM 3.10* 0.4 - 1.2 mg/dL Final  03/06/2010  5:37 AM 2.72* 0.4 - 1.2 mg/dL Final  03/02/2010  1:15 PM 2.77* 0.4 - 1.2 mg/dL Final  04/18/2008  4:35 AM 3.00* 0.4 - 1.2 mg/dL Final  04/17/2008  4:50 AM 3.15* 0.4 - 1.2 mg/dL Final  04/16/2008  4:47 AM 2.53* 0.4 - 1.2 mg/dL Final  04/08/2008 10:25 AM 2.54* 0.4 - 1.2 mg/dL Final  11/08/2007  4:10 AM 3.25*  Final  11/07/2007  5:20 AM 3.18*  Final  11/01/2007 11:20 AM 2.76*  Final    PMH:   Past Medical History  Diagnosis Date  . Thyroid disease   . High cholesterol   . Hypertension     sees Dr. Dione Housekeeper  . Diabetes mellitus     "diet  controlled"  . Kidney failure     sees Dr. Justin Mend  . Arthritis     PSH:   Past Surgical History  Procedure Laterality Date  . Joint replacement      bilateral knee  . Joint replacement      shoulder  . Total knee arthroplasty      right knee  . Knee surgery      left  . Shoulder surgery      right shoulder  . Eye surgery      bilateral cataract removal  . Dilation and curettage of uterus    . Tubal ligation    . Av fistula placement  04/03/2012    Procedure: ARTERIOVENOUS (AV) FISTULA CREATION;  Surgeon: Elam Dutch, MD;  Location: [facility] OR;  Service: Vascular;  Laterality: Left;  creation left brachial cephalic fistula     Allergies:  Allergies  Allergen Reactions  . Sulfa Drugs Cross Reactors Hives and Itching  . Amoxicillin Rash  . Percocet [Oxycodone-Acetaminophen] Itching and Rash    Medications:   Prior to Admission medications   Medication Sig Start Date End  Date Taking? Authorizing Provider  acetaminophen (TYLENOL) 325 MG tablet Take 650 mg by mouth every 6 (six) hours as needed for mild pain.   Yes Historical Provider, MD  amLODipine (NORVASC) 5 MG tablet Take 5 mg by mouth daily.     Yes Historical Provider, MD  aspirin EC 81 MG tablet Take 81 mg by mouth daily.     Yes Historical Provider, MD  carvedilol (COREG) 12.5 MG tablet Take 12.5 mg by mouth 2 (two) times daily.     Yes Historical Provider, MD  colchicine 0.6 MG tablet Take 0.6 mg by mouth daily.     Yes Historical Provider, MD  ezetimibe (ZETIA) 10 MG tablet Take 10 mg by mouth daily.    Yes Historical Provider, MD  HYDROcodone-acetaminophen (NORCO/VICODIN) 5-325 MG per tablet Take 1 tablet by mouth every 6 (six) hours as needed for moderate pain.   Yes Historical Provider, MD  levothyroxine (SYNTHROID, LEVOTHROID) 100 MCG tablet Take 100 mcg by mouth daily.     Yes Historical Provider, MD  sodium bicarbonate 650 MG tablet Take 650 mg by mouth 2 (two) times daily.     Yes Historical Provider, MD   Vitamin D, Ergocalciferol, (DRISDOL) 50000 UNITS CAPS Take 50,000 Units by mouth every 30 (thirty) days.     Yes Historical Provider, MD  ZEMPLAR 1 MCG capsule Take 1 capsule by mouth every Monday, Wednesday, and Friday.  02/27/12  Yes Historical Provider, MD    Inpatient medications: . sodium chloride   Intravenous STAT    Discontinued Meds:  There are no discontinued medications.  Social History:  reports that she has quit smoking. Her smoking use included Cigarettes. She smoked 0.00 packs per day. She has never used smokeless tobacco. She reports that she does not drink alcohol or use illicit drugs.  Family History:   Family History  Problem Relation Age of Onset  . Diabetes Sister   . Cancer Brother   . Hyperlipidemia Daughter   . Hypertension Daughter   . Hypertension Son     Pertinent items are noted in HPI. Weight change:  No intake or output data in the 24 hours ending 07/26/13 1452 BP 158/63  Pulse 74  Temp(Src) 98.6 F (37 C) (Oral)  Resp 15  Ht 5\' 3"  (1.6 m)  Wt 99.791 kg (220 lb)  BMI 38.98 kg/m2  SpO2 99% Filed Vitals:   07/26/13 1330 07/26/13 1400 07/26/13 1415 07/26/13 1430  BP: 153/59 174/63 165/60 158/63  Pulse: 58 63 63 74  Temp:      TempSrc:      Resp: 15 19 22 15   Height:      Weight:      SpO2: 99% 100% 99% 99%     General appearance: alert, cooperative, appears older than stated age, no distress and pale Head: Normocephalic, without obvious abnormality, atraumatic Eyes: negative findings: lids and lashes normal, conjunctivae and sclerae normal and corneas clear Neck: no adenopathy, no carotid bruit, no JVD, supple, symmetrical, trachea midline and thyroid not enlarged, symmetric, no tenderness/mass/nodules Resp: clear to auscultation bilaterally Cardio: regular rate and rhythm, S1, S2 normal, no murmur, click, rub or gallop GI: soft, non-tender; bowel sounds normal; no masses,  no organomegaly Extremities: +ecchymoses/hematomas of left leg,  LUE AVF +T/B  Labs: Basic Metabolic Panel:  Recent Labs Lab 07/26/13 1145  NA 140  K 6.4*  CL 105  CO2 20  GLUCOSE 113*  BUN 52*  CREATININE 4.23*  CALCIUM 9.0  Liver Function Tests: No results found for this basename: AST, ALT, ALKPHOS, BILITOT, PROT, ALBUMIN,  in the last 168 hours No results found for this basename: LIPASE, AMYLASE,  in the last 168 hours No results found for this basename: AMMONIA,  in the last 168 hours CBC:  Recent Labs Lab 07/26/13 1145  WBC 9.0  NEUTROABS 6.2  HGB 9.1*  HCT 29.2*  MCV 90.4  PLT 296   PT/INR: @LABRCNTIP (inr:5) Cardiac Enzymes: )No results found for this basename: CKTOTAL, CKMB, CKMBINDEX, TROPONINI,  in the last 168 hours CBG: No results found for this basename: GLUCAP,  in the last 168 hours  Iron Studies: No results found for this basename: IRON, TIBC, TRANSFERRIN, FERRITIN,  in the last 168 hours  Xrays/Other Studies: No results found.   Assessment/Plan: 1.  AKI/CKD- unclear etiology.  Possibly ischemic ATN/pre-renal azotemia in setting of decreased po intake and ABLA. 1. Will order renal US  2. Check SPEP/UPEP, FeNa 3. Cont to follow UOP and daily Scr 2. Hyperkalemia- agree with medical management for now, however is Scr and K cont to worsen, she may need to initiate HD 1. Restrict K in diet 2. Could also add lasix if K becomes persistent issue 3. ABLA/ACDz- s/p blood transfusion with appropriate response.  May require outpt EPO.  Will defer to her primary nephrologist at followup  4. HTN- stable 5. Obesity 6. Vascular access- LUE AVF +T/B and mature and ready for use if needed 7. SHPTH- on vit D will follow Ca and phos as well. 8. Hematoma- does not appear to be infected.   Governor Rooks Kemarion Abbey 07/26/2013, 2:52 PM

## 2013-07-26 NOTE — Progress Notes (Signed)
Utilization review completed. Yadhira Mckneely, RN, BSN. 

## 2013-07-26 NOTE — Progress Notes (Signed)
Pt up to bathroom with assist.  Pt having stools kayexelate effective. Pleasant.

## 2013-07-26 NOTE — ED Notes (Signed)
Dr. Yao at bedside. 

## 2013-07-26 NOTE — ED Notes (Signed)
Was told to come to ed from dr niland office for abn bloodwork, was not told any other information.

## 2013-07-26 NOTE — Progress Notes (Signed)
Pt going to [place] via wheelchair at this time.

## 2013-07-26 NOTE — Progress Notes (Signed)
Pt tranferred to room 6E 28 from ED via stretcher.  Pt had to have bm as soon as arriving to room.  Pt ambulates with walker from stretcher to bathroom with walker and standby assist.

## 2013-07-27 DIAGNOSIS — M7989 Other specified soft tissue disorders: Secondary | ICD-10-CM

## 2013-07-27 LAB — RENAL FUNCTION PANEL
Albumin: 2.5 g/dL — ABNORMAL LOW (ref 3.5–5.2)
BUN: 46 mg/dL — ABNORMAL HIGH (ref 6–23)
CO2: 12 mEq/L — ABNORMAL LOW (ref 19–32)
Calcium: 8.5 mg/dL (ref 8.4–10.5)
Chloride: 112 mEq/L (ref 96–112)
Creatinine, Ser: 3.63 mg/dL — ABNORMAL HIGH (ref 0.50–1.10)
GFR calc Af Amer: 13 mL/min — ABNORMAL LOW (ref >90)
GFR calc non Af Amer: 11 mL/min — ABNORMAL LOW (ref >90)
Glucose, Bld: 100 mg/dL — ABNORMAL HIGH (ref 70–99)
Phosphorus: 5.6 mg/dL — ABNORMAL HIGH (ref 2.3–4.6)
Potassium: 5.5 mEq/L — ABNORMAL HIGH (ref 3.7–5.3)
Sodium: 141 mEq/L (ref 137–147)

## 2013-07-27 LAB — CBC
HCT: 27.1 % — ABNORMAL LOW (ref 36.0–46.0)
Hemoglobin: 8.4 g/dL — ABNORMAL LOW (ref 12.0–15.0)
MCH: 28.6 pg (ref 26.0–34.0)
MCHC: 31 g/dL (ref 30.0–36.0)
MCV: 92.2 fL (ref 78.0–100.0)
Platelets: 241 10*3/uL (ref 150–400)
RBC: 2.94 MIL/uL — ABNORMAL LOW (ref 3.87–5.11)
RDW: 16.4 % — ABNORMAL HIGH (ref 11.5–15.5)
WBC: 7.1 10*3/uL (ref 4.0–10.5)

## 2013-07-27 LAB — GLUCOSE, CAPILLARY
Glucose-Capillary: 84 mg/dL (ref 70–99)
Glucose-Capillary: 106 mg/dL — ABNORMAL HIGH (ref 70–99)

## 2013-07-27 LAB — HEMOGLOBIN A1C
Hgb A1c MFr Bld: 5.2 % (ref <5.7)
Mean Plasma Glucose: 103 mg/dL (ref <117)

## 2013-07-27 LAB — IRON AND TIBC
Iron: 43 ug/dL (ref 42–135)
Saturation Ratios: 15 % — ABNORMAL LOW (ref 20–55)
TIBC: 283 ug/dL (ref 250–470)
UIBC: 240 ug/dL (ref 125–400)

## 2013-07-27 LAB — FERRITIN: Ferritin: 114 ng/mL (ref 10–291)

## 2013-07-27 MED ORDER — SODIUM POLYSTYRENE SULFONATE 15 GM/60ML PO SUSP
15.0000 g | Freq: Once | ORAL | Status: AC
  Administered 2013-07-27: 15 g via ORAL
  Filled 2013-07-27: qty 60

## 2013-07-27 MED ORDER — SODIUM BICARBONATE 650 MG PO TABS
1300.0000 mg | ORAL_TABLET | Freq: Three times a day (TID) | ORAL | Status: DC
  Administered 2013-07-27 – 2013-07-30 (×9): 1300 mg via ORAL
  Filled 2013-07-27 (×11): qty 2

## 2013-07-27 MED ORDER — FUROSEMIDE 20 MG PO TABS
20.0000 mg | ORAL_TABLET | Freq: Every day | ORAL | Status: DC
  Administered 2013-07-27 – 2013-07-30 (×4): 20 mg via ORAL
  Filled 2013-07-27 (×5): qty 1

## 2013-07-27 NOTE — Progress Notes (Signed)
Family Medicine Teaching Service Daily Progress Note Intern Pager: [phone]  Patient name: [name] Medical record number: [id number] Date of birth: 25-Dec-1940 Age: 73 y.o. Gender: female  Primary Care Provider: Sherrie Mustache, MD Consultants: Renal Code Status: DNR  Chief Complaint: abnormal labs  Assessment and Plan: [name] is a 73 y.o. female presenting with worsening fatigue/malaise and renal failure. PMH is significant for HTN, HLD, DM, CKD.  AKI with CKD Stage V (due to DM and HTN) and hyperkalemia -Creatinine trending back towards baseline of 3.4 at 3/63 today with peak of 4.23. Also good urine output. Renal U/s with medical renal disease. Fena >6 so likely intrinsic disease.  [ ]  f/u spep/upep -appreciate renal consult, will follow up recs today.  -Potassium this AM up to 5.5 again, will dose kayexalate again 15g now, no events on telemetry -nephrology to weigh in on dialysis.  Mature fistula in place with palpable thrill.  -decreased fluid rate, f.u renal recs  Anemia and Right leg hematoma -Anemia due to CKD likely based off iron studies. Hgb stable from 8-9 range. 8.4 today down from 9.1 but was hydrated so some dilution.  - Aranesp per renal [ ]  f/u LLE u/s as family states hematoma expanding upwards (but overall improving) -f/u FOBT  CV: hx of HTN/hypertensive in the ED with assc bradycardia, on coreg and amlodipine; no evidence of fluid overload on exam. -negative i stat troponon, EKG without ischemia signs or peaked t waves.   -no bradycardia with holding BB (still in low 60s though), continue amlodipine  Diabetes (diet controlled) a1c of 5.2, no sliding scale, TSH also normal  #FEN/GI: I decreased fluids to 50 ml/hr.  Prophylaxis: lovenox daily for GFR <30. Could consider unilateral SCD but given ESRD possible PAD, possibly consider ambulation as alternative given anemia and hematoma in leg  Disposition: pending further work up/decision making by  nephrology  Subjective:  No complaints this morning. No palpitations. Had renal u/s last night and wants to know results.   Objective: Temp:  [97.2 F (36.2 C)-98.6 F (37 C)] 98.6 F (37 C) (04/25 0606) Pulse Rate:  [58-84] 61 (04/25 0606) Resp:  [14-22] 18 (04/25 0606) BP: (126-174)/(50-82) 138/50 mmHg (04/25 0606) SpO2:  [96 %-100 %] 98 % (04/25 0606) Weight:  [220 lb (99.791 kg)-223 lb 8.7 oz (101.4 kg)] 223 lb 8.7 oz (101.4 kg) (04/24 2123) Physical Exam: General: NAD, resting in bed with daugher in law at bedside HEENT: Mucous membranes are moist. Cardiovascular: RRR, no mrg Respiratory: CTAB no wheezes, rales or rhonchi Abdomen: soft/nontender/normal bowel sounds Extremities: eft lower leg with anterolateral hematoma from just superior to lateral maleolus to inferior to the tibial tuberosity, mild warmth to the skin overlying the hematoma; mature fistula in left arm with palpable thrill and good bruit  Laboratory:  Recent Labs Lab 07/26/13 1145 07/27/13 0913  WBC 9.0 7.1  HGB 9.1* 8.4*  HCT 29.2* 27.1*  PLT 296 241    Recent Labs Lab 07/26/13 1145 07/26/13 1900 07/27/13 0913  NA 140 145 141  K 6.4* 5.0 5.5*  CL 105 113* 112  CO2 20 19 12*  BUN 52* 46* 46*  CREATININE 4.23* 3.82* 3.63*  CALCIUM 9.0 8.0* 8.5  GLUCOSE 113* 85 100*    I-STAT TROPOININ, ED     Status: None   Collection Time    07/26/13 11:51 AM      Result Value Ref Range   Troponin i, poc 0.02  0.00 - 0.08  ng/mL   Comment 3           CREATININE, URINE, RANDOM     Status: None   Collection Time    07/26/13  6:49 PM      Result Value Ref Range   Creatinine, Urine 47.99    SODIUM, URINE, RANDOM     Status: None   Collection Time    07/26/13  6:49 PM      Result Value Ref Range   Sodium, Ur 120    IRON AND TIBC     Status: Abnormal   Collection Time    07/26/13  8:04 PM      Result Value Ref Range   Iron 43  42 - 135 ug/dL   TIBC 283  250 - 470 ug/dL   Saturation Ratios 15 (*) 20  - 55 %   UIBC 240  125 - 400 ug/dL  FERRITIN     Status: None   Collection Time    07/26/13  8:04 PM      Result Value Ref Range   Ferritin 114  10 - 291 ng/mL     Imaging/Diagnostic Tests: US Renal  07/26/2013   CLINICAL DATA:  AKI/CKD  EXAM: RENAL/URINARY TRACT ULTRASOUND COMPLETE  COMPARISON:  DG LUMBAR SPINE COMPLETE 4+V dated 01/17/2013; US RENAL dated 02/23/2007  FINDINGS: Right Kidney:  Length: 10.4 cm. Increased cortical echogenicity. Multiple renal cyst are again appreciated slightly more prominent when compared previous study. Dominant cyst in the upper pole measuring 2 x 1.7 x 1.9 cm. No solid mass or hydronephrosis visualized.  Left Kidney:  Length: 10.5 cm. Increased cortical echogenicity. Multiple cysts are again appreciated with a dominant cyst in the midpole measuring 2.8 x 2.4 x 3 cm. No solid mass or hydronephrosis visualized.  Bladder:  Appears normal for degree of bladder distention.  Incidental note is made of gallstones.  IMPRESSION: 1. Bilateral increased cortical echogenicity consistent with medical renal disease 2. Slight increased prominence of the patient's known bilateral renal cysts. 3. Gallstones   Electronically Signed   By: Margaree Mackintosh M.D.   On: 07/26/2013 17:08   Marin Olp, MD 07/27/2013, 10:47 AM PGY-3, [place] Intern pager: [phone], text pages welcome

## 2013-07-27 NOTE — Progress Notes (Signed)
*  PRELIMINARY RESULTS* Vascular Ultrasound Left lower extremity venous duplex has been completed.  Preliminary findings:  No evidence of DVT in visualized veins.   Chapman Fitch RVT 07/27/2013, 11:36 AM

## 2013-07-27 NOTE — Progress Notes (Signed)
Attending Addendum  I examined the patient and discussed the assessment and plan with Dr. Yong Channel. I have reviewed the note and agree.  Patient's Cr and K are improving. She has developed interval metabolic acidosis. Discussed with Dr. Marval Regal who has started bicarb and recommends lasix for hyperkalemia management in CKD.   Lasix ordered 20 mg daily.   Regarding L leg hematoma: improved. Mildly tender. Doppler negative. Plan d/c lovenox. SCD to R leg. Monitor improvement. Continue doxy and cipro for now.   Appreciate renal recommendations.   Boykin Nearing, [place]

## 2013-07-27 NOTE — Progress Notes (Signed)
Patient ID: [name], female   DOB: 1940-06-14, 73 y.o.   MRN: [id number] S:feels better O:BP 138/50  Pulse 61  Temp(Src) 98.6 F (37 C) (Oral)  Resp 18  Ht 5\' 3"  (1.6 m)  Wt 101.4 kg (223 lb 8.7 oz)  BMI 39.61 kg/m2  SpO2 98%  Intake/Output Summary (Last 24 hours) at 07/27/13 1212 Last data filed at 07/27/13 [id number]  Gross per 24 hour  Intake   2025 ml  Output   1402 ml  Net    623 ml   Intake/Output: I/O last 3 completed shifts: In: 2025 [P.O.:600; I.V.:1425] Out: 1402 [Urine:1401; Stool:1]  Intake/Output this shift:    Weight change:  Gen:WD obese WF in NAd CVS:no rub Resp:cta [id number], +BS, soft Ext:+ecchymoses/hematomas of left leg   Recent Labs Lab 07/26/13 1145 07/26/13 1900 07/27/13 0913  NA 140 145 141  K 6.4* 5.0 5.5*  CL 105 113* 112  CO2 20 19 12*  GLUCOSE 113* 85 100*  BUN 52* 46* 46*  CREATININE 4.23* 3.82* 3.63*  ALBUMIN  --  2.7* 2.5*  CALCIUM 9.0 8.0* 8.5  PHOS  --  4.6 5.6*   Liver Function Tests:  Recent Labs Lab 07/26/13 1900 07/27/13 0913  ALBUMIN 2.7* 2.5*   No results found for this basename: LIPASE, AMYLASE,  in the last 168 hours No results found for this basename: AMMONIA,  in the last 168 hours CBC:  Recent Labs Lab 07/26/13 1145 07/27/13 0913  WBC 9.0 7.1  NEUTROABS 6.2  --   HGB 9.1* 8.4*  HCT 29.2* 27.1*  MCV 90.4 92.2  PLT 296 241   Cardiac Enzymes: No results found for this basename: CKTOTAL, CKMB, CKMBINDEX, TROPONINI,  in the last 168 hours CBG:  Recent Labs Lab 07/27/13 0754  GLUCAP 84    Iron Studies:  Recent Labs  07/26/13 2004  IRON 43  TIBC 283  FERRITIN 114   Studies/Results: US Renal  07/26/2013   CLINICAL DATA:  AKI/CKD  EXAM: RENAL/URINARY TRACT ULTRASOUND COMPLETE  COMPARISON:  DG LUMBAR SPINE COMPLETE 4+V dated 01/17/2013; US RENAL dated 02/23/2007  FINDINGS: Right Kidney:  Length: 10.4 cm. Increased cortical echogenicity. Multiple renal cyst are again appreciated slightly more  prominent when compared previous study. Dominant cyst in the upper pole measuring 2 x 1.7 x 1.9 cm. No solid mass or hydronephrosis visualized.  Left Kidney:  Length: 10.5 cm. Increased cortical echogenicity. Multiple cysts are again appreciated with a dominant cyst in the midpole measuring 2.8 x 2.4 x 3 cm. No solid mass or hydronephrosis visualized.  Bladder:  Appears normal for degree of bladder distention.  Incidental note is made of gallstones.  IMPRESSION: 1. Bilateral increased cortical echogenicity consistent with medical renal disease 2. Slight increased prominence of the patient's known bilateral renal cysts. 3. Gallstones   Electronically Signed   By: Margaree Mackintosh M.D.   On: 07/26/2013 17:08   . amLODipine  5 mg Oral Daily  . aspirin EC  81 mg Oral Daily  . ciprofloxacin  500 mg Oral Q24H  . doxycycline  100 mg Oral Q12H  . enoxaparin (LOVENOX) injection  30 mg Subcutaneous Q24H  . ezetimibe  10 mg Oral Daily  . levothyroxine  100 mcg Oral QAC breakfast  . sodium chloride  3 mL Intravenous Q12H  . sodium polystyrene  15 g Oral Once    BMET    Component Value Date/Time   NA 141 07/27/2013 0913   K 5.5* 07/27/2013  0913   CL 112 07/27/2013 0913   CO2 12* 07/27/2013 0913   GLUCOSE 100* 07/27/2013 0913   BUN 46* 07/27/2013 0913   CREATININE 3.63* 07/27/2013 0913   CALCIUM 8.5 07/27/2013 0913   GFRNONAA 11* 07/27/2013 0913   GFRAA 13* 07/27/2013 0913   CBC    Component Value Date/Time   WBC 7.1 07/27/2013 0913   RBC 2.94* 07/27/2013 0913   HGB 8.4* 07/27/2013 0913   HCT 27.1* 07/27/2013 0913   PLT 241 07/27/2013 0913   MCV 92.2 07/27/2013 0913   MCH 28.6 07/27/2013 0913   MCHC 31.0 07/27/2013 0913   RDW 16.4* 07/27/2013 0913   LYMPHSABS 1.2 07/26/2013 1145   MONOABS 0.8 07/26/2013 1145   EOSABS 0.7 07/26/2013 1145   BASOSABS 0.1 07/26/2013 1145     Assessment/Plan:  1. AKI/CKD- unclear etiology. Possibly ischemic ATN/pre-renal azotemia in setting of decreased po intake and ABLA. Scr  continues to improve 1. Renal US c/w chronic medical renal disease 2. SPEP/UPEP pending, FeNa >1% 3. Cont to follow UOP and daily Scr 2. Hyperkalemia- agree with medical management for now, however is Scr and K cont to worsen, she may need to initiate HD  1. Restrict K in diet 2. Need to treat the metabolic acidosis to help with the hyperkalemia (started po bicarb, however if she is going to cont with IVF's would switch to isotonic bicarb will defer to primary svc) 3. Could also add lasix 20mg  daily 3. Metabolic acidosis- started po bicarb but could use IV isotonic bicarb if primary svc wants to cont with IVF's. 4. ABLA/ACDz- s/p blood transfusion with appropriate response. May require outpt EPO. Will defer to her primary nephrologist at followup  5. HTN- stable 6. Obesity 7. Vascular access- LUE AVF +T/B and mature and ready for use if needed 8. SHPTH- on vit D will follow Ca and phos as well. 9. Hematoma- does not appear to be infected. [place]

## 2013-07-28 LAB — CBC
HCT: 26 % — ABNORMAL LOW (ref 36.0–46.0)
Hemoglobin: 8 g/dL — ABNORMAL LOW (ref 12.0–15.0)
MCH: 28.5 pg (ref 26.0–34.0)
MCHC: 30.8 g/dL (ref 30.0–36.0)
MCV: 92.5 fL (ref 78.0–100.0)
Platelets: 221 10*3/uL (ref 150–400)
RBC: 2.81 MIL/uL — ABNORMAL LOW (ref 3.87–5.11)
RDW: 16.3 % — ABNORMAL HIGH (ref 11.5–15.5)
WBC: 6.2 10*3/uL (ref 4.0–10.5)

## 2013-07-28 LAB — RENAL FUNCTION PANEL
Albumin: 2.9 g/dL — ABNORMAL LOW (ref 3.5–5.2)
BUN: 47 mg/dL — ABNORMAL HIGH (ref 6–23)
CO2: 18 mEq/L — ABNORMAL LOW (ref 19–32)
Calcium: 8.3 mg/dL — ABNORMAL LOW (ref 8.4–10.5)
Chloride: 112 mEq/L (ref 96–112)
Creatinine, Ser: 4.1 mg/dL — ABNORMAL HIGH (ref 0.50–1.10)
GFR calc Af Amer: 11 mL/min — ABNORMAL LOW (ref >90)
GFR calc non Af Amer: 10 mL/min — ABNORMAL LOW (ref >90)
Glucose, Bld: 90 mg/dL (ref 70–99)
Phosphorus: 4.8 mg/dL — ABNORMAL HIGH (ref 2.3–4.6)
Potassium: 4.2 mEq/L (ref 3.7–5.3)
Sodium: 147 mEq/L (ref 137–147)

## 2013-07-28 LAB — GLUCOSE, CAPILLARY: Glucose-Capillary: 79 mg/dL (ref 70–99)

## 2013-07-28 NOTE — Progress Notes (Signed)
Family Medicine Teaching Service Daily Progress Note Intern Pager: [phone]  Patient name: [name] Medical record number: [id number] Date of birth: [birth year] Age: 73 y.o. Gender: female  Primary Care Provider: Sherrie Mustache, MD Consultants: Renal Code Status: DNR  Chief Complaint: abnormal labs  Assessment and Plan: [name] is a 73 y.o. female presenting with worsening fatigue/malaise and renal failure. PMH is significant for HTN, HLD, DM, CKD.  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 Good urine output. Renal U/s with medical renal disease. Fena >6 so likely intrinsic disease. 1.45L output. [ ]  f/u spep/upep -appreciate renal consult, will follow up recs today. Not likely HD at this time. Mature fistula in place with palpable thrill.  -KVO fluids -on bicarb per renal for metabolic acidosis - consider discontinuing lasix because of increasing creatinine  Anemia and Left leg hematoma -Anemia due to CKD likely based off iron studies. Hgb stable from 8-9 range. 8.0 today down from 9.1 but was hydrated so some dilution.  - Aranesp per renal - no DVT on u/s - does not appear to be cellulitis- d/c antibiotics - f/u FOBT (Needs to be collected)  CV: hx of HTN/hypertensive in the ED with assc bradycardia, on coreg and amlodipine; no evidence of fluid overload on exam. -negative i stat troponon, EKG without ischemia signs or peaked t waves.   -increase to amlodipine 10mg   Diabetes (diet controlled) a1c of 5.2, no sliding scale, TSH also normal  #FEN/GI: I decreased fluids to KVO Prophylaxis: unilateral SCD given hematoma.   Disposition: pending further work up/decision making by nephrology  Subjective:  No complaints this morning. Patient has  good urine output and is peeing every hour or so. No chest pain or shortness of breath.  Objective: Temp:  [98 F (36.7 C)-98.5 F (36.9 C)] 98 F (36.7 C) (04/27 0458) Pulse Rate:  [66-76] 66 (04/27 0458) Resp:  [18-20] 18 (04/27 0458) BP: (135-179)/(53-67) 151/62 mmHg (04/27 0458) SpO2:  [97 %-99 %] 97 % (04/27 0458) Weight:  [222 lb 3.6 oz (100.8 kg)] 222 lb 3.6 oz (100.8 kg) (04/26 2100)  Physical Exam: General: NAD, laying in bed HEENT: Mucous membranes are moist. Cardiovascular: RRR, no mrg Respiratory: CTAB no wheezes, rales or rhonchi Abdomen: soft/nontender/non-distended, normal bowel sounds Extremities: left lower leg with anterolateral hematoma from just superior to lateral maleolus to inferior to the tibial tuberosity, mild warmth to the skin overlying the hematoma, not expanding; mature fistula in left arm with palpable thrill and good bruit. Left leg noticeably larger than right leg with no calf tenderness.  Laboratory:  Recent Labs Lab 07/27/13 0913 07/28/13 0539 07/29/13 0615  WBC 7.1 6.2 6.3  HGB 8.4* 8.0* 7.6*  HCT 27.1* 26.0* 24.7*  PLT 241 221 221    Recent Labs Lab 07/27/13 0913 07/28/13 0539 07/29/13 0615  NA 141 147 145  K 5.5* 4.2 4.6  CL 112 112 111  CO2 12* 18* 21  BUN 46* 47* 48*  CREATININE 3.63* 4.10* 4.23*  CALCIUM 8.5 8.3* 8.7  GLUCOSE 100* 90 85    Imaging/Diagnostic Tests: No results found.  Cordelia Poche, MD 07/29/2013, 9:16 AM PGY-1, [place] Intern pager: [phone], text pages welcome

## 2013-07-28 NOTE — Progress Notes (Signed)
Family Medicine Teaching Service Daily Progress Note Intern Pager: [phone]  Patient name: [name] Medical record number: [id number] Date of birth: Sep 14, 1940 Age: 73 y.o. Gender: female  Primary Care Provider: Sherrie Mustache, MD Consultants: Renal Code Status: DNR  Chief Complaint: abnormal labs  Assessment and Plan: [name] is a 73 y.o. female presenting with worsening fatigue/malaise and renal failure. PMH is significant for HTN, HLD, DM, CKD.  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 Good urine output. Renal U/s with medical renal disease. Fena >6 so likely intrinsic disease.  [ ]  f/u spep/upep -appreciate renal consult, will follow up recs today. Not likely HD at this time. Mature fistula in place with palpable thrill.  -First day not hyperkalemic with addition of lasix -KVO fluids -on bicarb per renal for metabolic acidosis   Anemia and Right leg hematoma -Anemia due to CKD likely based off iron studies. Hgb stable from 8-9 range. 8.0 today down from 9.1 but was hydrated so some dilution.  - Aranesp per renal -no DVT on u/s -does not appear to be cellulitis- d/c antibiotics -f/u FOBT  CV: hx of HTN/hypertensive in the ED with assc bradycardia, on coreg and amlodipine; no evidence of fluid overload on exam. -negative i stat troponon, EKG without ischemia signs or peaked t waves.   -no bradycardia with holding BB (still in low 60s though), continue amlodipine  Diabetes (diet controlled) a1c of 5.2, no sliding scale, TSH also normal  #FEN/GI: I decreased fluids to KVO Prophylaxis: unilateral SCD given hematoma.   Disposition: pending further work up/decision making by nephrology  Subjective:  No complaints this morning. No palpitations or chest pain.    Objective: Temp:  [97.6 F (36.4 C)-98.9 F (37.2 C)] 97.9 F (36.6 C) (04/26 0500) Pulse Rate:  [65-75] 65 (04/26 0500) Resp:  [18-20] 18 (04/26 0500) BP: (146-151)/(56-65) 148/65 mmHg (04/26 0500) SpO2:  [97 %-100 %] 97 % (04/26 0500) Weight:  [223 lb 12.3 oz (101.5 kg)] 223 lb 12.3 oz (101.5 kg) (04/25 2044) Physical Exam: General: NAD HEENT: Mucous membranes are moist. Cardiovascular: RRR, no mrg Respiratory: CTAB no wheezes, rales or rhonchi Abdomen: soft/nontender/normal bowel sounds Extremities: left lower leg with anterolateral hematoma from just superior to lateral maleolus to inferior to the tibial tuberosity, mild warmth to the skin overlying the hematoma, not expanding; mature fistula in left arm with palpable thrill and good bruit  Laboratory:  Recent Labs Lab 07/26/13 1145 07/27/13 0913 07/28/13 0539  WBC 9.0 7.1 6.2  HGB 9.1* 8.4* 8.0*  HCT 29.2* 27.1* 26.0*  PLT 296 241 221    Recent Labs Lab 07/26/13 1900 07/27/13 0913 07/28/13 0539  NA 145 141 147  K 5.0 5.5* 4.2  CL 113* 112 112  CO2 19 12* 18*  BUN 46* 46* 47*  CREATININE 3.82* 3.63* 4.10*  CALCIUM 8.0* 8.5 8.3*  GLUCOSE 85 100* 90    Imaging/Diagnostic Tests: No results found.  Marin Olp, MD 07/28/2013, 8:12 AM PGY-3, [facility] Intern pager: [phone], text pages welcome

## 2013-07-28 NOTE — Progress Notes (Signed)
Patient ID: [name], female   DOB: June 11, 1940, 73 y.o.   MRN: [id number] S:no new complaints O:BP 135/53  Pulse 66  Temp(Src) 98.5 F (36.9 C) (Oral)  Resp 20  Ht 5\' 3"  (1.6 m)  Wt 101.5 kg (223 lb 12.3 oz)  BMI 39.65 kg/m2  SpO2 99%  Intake/Output Summary (Last 24 hours) at 07/28/13 1013 Last data filed at 07/28/13 0500  Gross per 24 hour  Intake 1293.49 ml  Output   1300 ml  Net  -6.51 ml   Intake/Output: I/O last 3 completed shifts: In: 3078.5 [P.O.:600; I.V.:2478.5] Out: 2700 [Urine:2700]  Intake/Output this shift:    Weight change: 1.709 kg (3 lb 12.3 oz) Gen:WD obese WF in NAd CVS:no rub Resp:cta [id number] leg hematomas, LUE AVF +T/B   Recent Labs Lab 07/26/13 1145 07/26/13 1900 07/27/13 0913 07/28/13 0539  NA 140 145 141 147  K 6.4* 5.0 5.5* 4.2  CL 105 113* 112 112  CO2 20 19 12* 18*  GLUCOSE 113* 85 100* 90  BUN 52* 46* 46* 47*  CREATININE 4.23* 3.82* 3.63* 4.10*  ALBUMIN  --  2.7* 2.5* 2.9*  CALCIUM 9.0 8.0* 8.5 8.3*  PHOS  --  4.6 5.6* 4.8*   Liver Function Tests:  Recent Labs Lab 07/26/13 1900 07/27/13 0913 07/28/13 0539  ALBUMIN 2.7* 2.5* 2.9*   No results found for this basename: LIPASE, AMYLASE,  in the last 168 hours No results found for this basename: AMMONIA,  in the last 168 hours CBC:  Recent Labs Lab 07/26/13 1145 07/27/13 0913 07/28/13 0539  WBC 9.0 7.1 6.2  NEUTROABS 6.2  --   --   HGB 9.1* 8.4* 8.0*  HCT 29.2* 27.1* 26.0*  MCV 90.4 92.2 92.5  PLT 296 241 221   Cardiac Enzymes: No results found for this basename: CKTOTAL, CKMB, CKMBINDEX, TROPONINI,  in the last 168 hours CBG:  Recent Labs Lab 07/27/13 0754 07/27/13 1534 07/28/13 0751  GLUCAP 84 106* 79    Iron Studies:  Recent Labs  07/26/13 2004  IRON 43  TIBC 283  FERRITIN 114   Studies/Results: US Renal  07/26/2013   CLINICAL DATA:  AKI/CKD  EXAM: RENAL/URINARY TRACT ULTRASOUND COMPLETE  COMPARISON:  DG LUMBAR SPINE COMPLETE 4+V  dated 01/17/2013; US RENAL dated 02/23/2007  FINDINGS: Right Kidney:  Length: 10.4 cm. Increased cortical echogenicity. Multiple renal cyst are again appreciated slightly more prominent when compared previous study. Dominant cyst in the upper pole measuring 2 x 1.7 x 1.9 cm. No solid mass or hydronephrosis visualized.  Left Kidney:  Length: 10.5 cm. Increased cortical echogenicity. Multiple cysts are again appreciated with a dominant cyst in the midpole measuring 2.8 x 2.4 x 3 cm. No solid mass or hydronephrosis visualized.  Bladder:  Appears normal for degree of bladder distention.  Incidental note is made of gallstones.  IMPRESSION: 1. Bilateral increased cortical echogenicity consistent with medical renal disease 2. Slight increased prominence of the patient's known bilateral renal cysts. 3. Gallstones   Electronically Signed   By: Margaree Mackintosh M.D.   On: 07/26/2013 17:08   . amLODipine  5 mg Oral Daily  . aspirin EC  81 mg Oral Daily  . ezetimibe  10 mg Oral Daily  . furosemide  20 mg Oral Daily  . levothyroxine  100 mcg Oral QAC breakfast  . sodium bicarbonate  1,300 mg Oral TID  . sodium chloride  3 mL Intravenous Q12H    BMET  Component Value Date/Time   NA 147 07/28/2013 0539   K 4.2 07/28/2013 0539   CL 112 07/28/2013 0539   CO2 18* 07/28/2013 0539   GLUCOSE 90 07/28/2013 0539   BUN 47* 07/28/2013 0539   CREATININE 4.10* 07/28/2013 0539   CALCIUM 8.3* 07/28/2013 0539   GFRNONAA 10* 07/28/2013 0539   GFRAA 11* 07/28/2013 0539   CBC    Component Value Date/Time   WBC 6.2 07/28/2013 0539   RBC 2.81* 07/28/2013 0539   HGB 8.0* 07/28/2013 0539   HCT 26.0* 07/28/2013 0539   PLT 221 07/28/2013 0539   MCV 92.5 07/28/2013 0539   MCH 28.5 07/28/2013 0539   MCHC 30.8 07/28/2013 0539   RDW 16.3* 07/28/2013 0539   LYMPHSABS 1.2 07/26/2013 1145   MONOABS 0.8 07/26/2013 1145   EOSABS 0.7 07/26/2013 1145   BASOSABS 0.1 07/26/2013 1145     Assessment/Plan:  1. AKI/CKD- unclear etiology. Possibly  ischemic ATN/pre-renal azotemia in setting of decreased po intake and ABLA. Scr increased over the last 24 hours following initiation of lasix.  Does not appear uremic.  Cont to follow for now and hold off on RRT at this time. 1. Renal US c/w chronic medical renal disease 2. SPEP/UPEP pending, FeNa >1% 3. Cont to follow UOP and daily Scr 2. Hyperkalemia- improved with medical management for now  1. Restrict K in diet 2. Need to treat the metabolic acidosis to help with the hyperkalemia (started po bicarb, however if she is going to cont with IVF's would switch to isotonic bicarb will defer to primary svc) 3. Could also add lasix 20mg  daily 3. Metabolic acidosis- started po bicarb but could use IV isotonic bicarb if primary svc wants to cont with IVF's. 4. ABLA/ACDz- s/p blood transfusion with appropriate response initially, however it is dropping again. Will likely require outpt EPO. Will defer to her primary nephrologist and need to find source of blood loss.  Doubtful that the hematomas in her leg could hold over 3 units of blood.  5. HTN- stable 6. Obesity 7. Vascular access- LUE AVF +T/B and mature and ready for use if needed 8. SHPTH- on vit D will follow Ca and phos as well. 9. Hematoma- does not appear to be infected. 10. Dispo- in light of dropping Hgb and rising Scr, not yet stable for d/c.  [place]

## 2013-07-28 NOTE — Progress Notes (Signed)
Attending Addendum  I examined the patient and discussed the assessment and plan with Dr. Hunter. I have reviewed the note and agree.    Rolanda Campa, MD FAMILY MEDICINE TEACHING SERVICE    

## 2013-07-29 LAB — RENAL FUNCTION PANEL
Albumin: 2.8 g/dL — ABNORMAL LOW (ref 3.5–5.2)
BUN: 48 mg/dL — ABNORMAL HIGH (ref 6–23)
CO2: 21 mEq/L (ref 19–32)
Calcium: 8.7 mg/dL (ref 8.4–10.5)
Chloride: 111 mEq/L (ref 96–112)
Creatinine, Ser: 4.23 mg/dL — ABNORMAL HIGH (ref 0.50–1.10)
GFR calc Af Amer: 11 mL/min — ABNORMAL LOW (ref >90)
GFR calc non Af Amer: 10 mL/min — ABNORMAL LOW (ref >90)
Glucose, Bld: 85 mg/dL (ref 70–99)
Phosphorus: 5.2 mg/dL — ABNORMAL HIGH (ref 2.3–4.6)
Potassium: 4.6 mEq/L (ref 3.7–5.3)
Sodium: 145 mEq/L (ref 137–147)

## 2013-07-29 LAB — GLUCOSE, CAPILLARY
Glucose-Capillary: 103 mg/dL — ABNORMAL HIGH (ref 70–99)
Glucose-Capillary: 80 mg/dL (ref 70–99)
Glucose-Capillary: 96 mg/dL (ref 70–99)

## 2013-07-29 LAB — CBC
HCT: 24.7 % — ABNORMAL LOW (ref 36.0–46.0)
Hemoglobin: 7.6 g/dL — ABNORMAL LOW (ref 12.0–15.0)
MCH: 28.6 pg (ref 26.0–34.0)
MCHC: 30.8 g/dL (ref 30.0–36.0)
MCV: 92.9 fL (ref 78.0–100.0)
Platelets: 221 10*3/uL (ref 150–400)
RBC: 2.66 MIL/uL — ABNORMAL LOW (ref 3.87–5.11)
RDW: 16.4 % — ABNORMAL HIGH (ref 11.5–15.5)
WBC: 6.3 10*3/uL (ref 4.0–10.5)

## 2013-07-29 MED ORDER — AMLODIPINE BESYLATE 10 MG PO TABS: 10.0000 mg | ORAL_TABLET | Freq: Every day | ORAL | Status: DC

## 2013-07-29 MED ORDER — AMLODIPINE BESYLATE 10 MG PO TABS
10.0000 mg | ORAL_TABLET | Freq: Every day | ORAL | Status: DC
  Administered 2013-07-30: 10 mg via ORAL
  Filled 2013-07-29: qty 1

## 2013-07-29 MED ORDER — DARBEPOETIN ALFA-POLYSORBATE 60 MCG/0.3ML IJ SOLN: 60.0000 ug | INTRAMUSCULAR | Status: DC

## 2013-07-29 MED ORDER — FUROSEMIDE 20 MG PO TABS: 20.0000 mg | ORAL_TABLET | Freq: Every day | ORAL | Status: DC

## 2013-07-29 MED ORDER — DARBEPOETIN ALFA-POLYSORBATE 60 MCG/0.3ML IJ SOLN
60.0000 ug | INTRAMUSCULAR | Status: DC
  Administered 2013-07-29: 60 ug via SUBCUTANEOUS
  Filled 2013-07-29: qty 0.3

## 2013-07-29 MED ORDER — SODIUM BICARBONATE 650 MG PO TABS
1300.0000 mg | ORAL_TABLET | Freq: Two times a day (BID) | ORAL | Status: DC
Start: 2013-07-29 — End: 2015-02-03

## 2013-07-29 NOTE — Progress Notes (Signed)
FMTS ATTENDING  NOTE Maliah Pyles,MD I  have seen and examined this patient, reviewed their chart. I have discussed this patient with the resident. I agree with the resident's findings, assessment and care plan. 

## 2013-07-29 NOTE — Progress Notes (Signed)
[facility] ROUNDING NOTE   Subjective:   Interval History: no complaints  Objective:  Vital signs in last 24 hours:  Temp:  [98 F (36.7 C)-98.4 F (36.9 C)] 98.4 F (36.9 C) (04/27 0945) Pulse Rate:  [66-76] 74 (04/27 0945) Resp:  [18] 18 (04/27 0945) BP: (151-179)/(58-67) 167/58 mmHg (04/27 0945) SpO2:  [97 %-99 %] 98 % (04/27 0945) Weight:  [100.8 kg (222 lb 3.6 oz)] 100.8 kg (222 lb 3.6 oz) (04/26 2100)  Weight change: -0.7 kg (-1 lb 8.7 oz) Filed Weights   07/26/13 2123 07/27/13 2044 07/28/13 2100  Weight: 101.4 kg (223 lb 8.7 oz) 101.5 kg (223 lb 12.3 oz) 100.8 kg (222 lb 3.6 oz)    Intake/Output: I/O last 3 completed shifts: In: 857.7 [P.O.:360; I.V.:497.7] Out: 2750 [Urine:2750]   Intake/Output this shift:  Total I/O In: 240 [P.O.:240] Out: 300 [Urine:300]  CVS- RRR RS- CTA ABD- BS present soft non-distended EXT- no edema  left leg hematomas, LUE AVF +T/B    Basic Metabolic Panel:  Recent Labs Lab 07/26/13 1145 07/26/13 1900 07/27/13 0913 07/28/13 0539 07/29/13 0615  NA 140 145 141 147 145  K 6.4* 5.0 5.5* 4.2 4.6  CL 105 113* 112 112 111  CO2 20 19 12* 18* 21  GLUCOSE 113* 85 100* 90 85  BUN 52* 46* 46* 47* 48*  CREATININE 4.23* 3.82* 3.63* 4.10* 4.23*  CALCIUM 9.0 8.0* 8.5 8.3* 8.7  PHOS  --  4.6 5.6* 4.8* 5.2*    Liver Function Tests:  Recent Labs Lab 07/26/13 1900 07/27/13 0913 07/28/13 0539 07/29/13 0615  ALBUMIN 2.7* 2.5* 2.9* 2.8*   No results found for this basename: LIPASE, AMYLASE,  in the last 168 hours No results found for this basename: AMMONIA,  in the last 168 hours  CBC:  Recent Labs Lab 07/26/13 1145 07/27/13 0913 07/28/13 0539 07/29/13 0615  WBC 9.0 7.1 6.2 6.3  NEUTROABS 6.2  --   --   --   HGB 9.1* 8.4* 8.0* 7.6*  HCT 29.2* 27.1* 26.0* 24.7*  MCV 90.4 92.2 92.5 92.9  PLT 296 241 221 221    Cardiac Enzymes: No results found for this basename: CKTOTAL, CKMB, CKMBINDEX, TROPONINI,  in  the last 168 hours  BNP: No components found with this basename: POCBNP,   CBG:  Recent Labs Lab 07/27/13 0754 07/27/13 1534 07/28/13 0751 07/29/13 0747  GLUCAP 84 106* 72 103*    Microbiology: Results for orders placed during the hospital encounter of 03/29/12  SURGICAL PCR SCREEN     Status: None   Collection Time    03/29/12 10:30 AM      Result Value Ref Range Status   MRSA, PCR NEGATIVE  NEGATIVE Final   Staphylococcus aureus NEGATIVE  NEGATIVE Final   Comment:            The Xpert SA Assay (FDA     approved for NASAL specimens     in patients over 66 years of age),     is one component of     a comprehensive surveillance     program.  Test performance has     been validated by [employer] for patients greater     than or equal to 30 year old.     It is not intended     to diagnose infection nor to     guide or monitor treatment.    Coagulation Studies: No results found  for this basename: LABPROT, INR,  in the last 72 hours  Urinalysis: No results found for this basename: COLORURINE, APPERANCEUR, LABSPEC, PHURINE, GLUCOSEU, HGBUR, BILIRUBINUR, KETONESUR, PROTEINUR, UROBILINOGEN, NITRITE, LEUKOCYTESUR,  in the last 72 hours    Imaging: No results found.   Medications:   . sodium chloride 20 mL/hr at 07/27/13 1537   . [START ON 07/30/2013] amLODipine  10 mg Oral Daily  . aspirin EC  81 mg Oral Daily  . ezetimibe  10 mg Oral Daily  . furosemide  20 mg Oral Daily  . levothyroxine  100 mcg Oral QAC breakfast  . sodium bicarbonate  1,300 mg Oral TID  . sodium chloride  3 mL Intravenous Q12H   HYDROcodone-acetaminophen  Assessment/ Plan:  PMH for HTD, CKD Stage IV-V but not yet on HD, HTN, HLD, DM2 who presents with acute exacerbation of CKD based on lab work obtained by PCP. Also noted to have hyperkalemia and elevated BUN. Has had worsening fatigue, malaise, and chills on outpatient basis.   Patient stable from a renal standpoint no uremic signs  and symptoms  . Would hold on dialysis   Hyperkalemia resolved  Anemia continue aranesp  Volume stable  No change in diuretics  We could sign off . Please reconsult if needed   LOS: [place] @TODAY @12 :59 PM
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
## 2013-07-29 NOTE — Progress Notes (Signed)
Received call from MD that pt/family seeking SNF. First choice is [facility]. Made referral to [facility] and called them asking them to review for possible same-day admission. Facility reviewing now. Covering CSW to follow-up with facility and will page MD if pt can go today.   Ky Barban, MSW, [facility] Clinical Social Worker [phone]

## 2013-07-29 NOTE — Progress Notes (Signed)
Interim Progress Note  Spoke with Dr. Justin Mend, Nephrology, and recommended to start patient on Aranesp 74mcg qweekly with first dose tonight. Planning to discharge patient to SNF for rehab. Patient will continue therapy once discharged. Setting patient up for SNF. Most likely discharge tomorrow if bed becomes available.  Aranesp ordered.  Cordelia Poche, MD PGY-1, [place] [facility] 07/29/2013, 2:44 PM Pager: [phone]

## 2013-07-29 NOTE — Discharge Instructions (Signed)
We admitted you because your kidney's showed signs of recent damage. We worked with the kidney doctors to help your kidneys function better. You should follow-up your lab work when you see your primary care physician. We are discharging you on a low dose of a diuretic as well because you had a lot of potassium in your blood. Please see your primary care physician as soon as possible for a follow-up.

## 2013-07-29 NOTE — Progress Notes (Addendum)
Clinical Social Work Department CLINICAL SOCIAL WORK PLACEMENT NOTE 07/29/2013  Patient:  [name], [name]  Account Number:  [id number] Admit date:  07/26/2013  Clinical Social Worker:  Creta Levin, LCSW  Date/time:  07/29/2013 07:41 PM  Clinical Social Work is seeking post-discharge placement for this patient at the following level of care:   SKILLED NURSING   (*CSW will update this form in Epic as items are completed)   07/29/2013  Patient/family provided with [place] Department of Clinical Social Work's list of facilities offering this level of care within the geographic area requested by the patient (or if unable, by the patient's family).  07/29/2013  Patient/family informed of their freedom to choose among providers that offer the needed level of care, that participate in Medicare, Medicaid or managed care program needed by the patient, have an available bed and are willing to accept the patient.  07/29/2013  Patient/family informed of [facility]' ownership interest in [facility], as well as of the fact that they are under no obligation to receive care at this facility.  PASARR submitted to EDS on  PASARR number received from [place] on   FL2 transmitted to all facilities in geographic area requested by pt/family on  07/29/2013 FL2 transmitted to all facilities within larger geographic area on   Patient informed that his/her managed care company has contracts with or will negotiate with  certain facilities, including the following:     Patient/family informed of bed offers received:  07/30/2013 Patient chooses bed at [facility] Physician recommends and patient chooses bed at    Patient to be transferred to [facility] on 07/30/2013  Patient to be transferred to facility by [facility]  The following physician request were entered in Epic:   Additional Comments: daughter-in-law in room. Questions answered by [facility] liaison. [facility]  scheduled to pick pt up at 2pm. RN informed. Packet placed on chart. Family/pt informed. CSW signing off.   Ky Barban, MSW, [facility] Clinical Social Worker [phone]

## 2013-07-29 NOTE — ED Notes (Signed)
Clinical Social Work Department BRIEF PSYCHOSOCIAL ASSESSMENT 07/29/2013  Patient:  [name], [name]     Account Number:  [id number]     Admit date:  07/26/2013  Clinical Social Worker:  Ulyess Blossom  Date/Time:  07/29/2013 07:45 PM  Referred by:  CSW  Date Referred:  07/29/2013  Other Referral:   Interview type:  Patient Other interview type:    PSYCHOSOCIAL DATA Living Status:  HUSBAND Admitted from facility:   Level of care:   Primary support name:  [name] Primary support relationship to patient:  SPOUSE Degree of support available:   Good.    CURRENT CONCERNS Current Concerns  Post-Acute Placement   Other Concerns:    SOCIAL WORK ASSESSMENT / PLAN Per Ky Barban, CSW, pt was interested in bed at [facility], but CSW received word later in the day that that facility could not accept her.  Spoke with pt who was agreeable to continued bed search.  pt has been at [facility], [place] and [facility] in the past, but "was not familiar with any other facilities.  pt does not want to go to [facility] or [facility].  CSW suggested bed search in [place], [place] and pt was agreeable.  CSW will update pt with bed offers as they become available and facilitate NH tx as appropriate.   Assessment/plan status:  Psychosocial Support/Ongoing Assessment of Needs Other assessment/ plan:   Information/referral to community resources:    PATIENT'S/FAMILY'S RESPONSE TO PLAN OF CARE: Disappointed that [facility] did not offer a bed, but agreeable to continues bed search.  Emotional support offered.  Pt appreciative of continued CSW intervention.

## 2013-07-30 ENCOUNTER — Other Ambulatory Visit: Payer: Self-pay | Admitting: *Deleted

## 2013-07-30 LAB — CBC
HCT: 25.2 % — ABNORMAL LOW (ref 36.0–46.0)
Hemoglobin: 7.8 g/dL — ABNORMAL LOW (ref 12.0–15.0)
MCH: 28.8 pg (ref 26.0–34.0)
MCHC: 31 g/dL (ref 30.0–36.0)
MCV: 93 fL (ref 78.0–100.0)
Platelets: 209 10*3/uL (ref 150–400)
RBC: 2.71 MIL/uL — ABNORMAL LOW (ref 3.87–5.11)
RDW: 16.3 % — ABNORMAL HIGH (ref 11.5–15.5)
WBC: 6.8 10*3/uL (ref 4.0–10.5)

## 2013-07-30 LAB — PROTEIN ELECTROPHORESIS, SERUM
Albumin ELP: 53.5 % — ABNORMAL LOW (ref 55.8–66.1)
Alpha-1-Globulin: 7.8 % — ABNORMAL HIGH (ref 2.9–4.9)
Alpha-2-Globulin: 8.7 % (ref 7.1–11.8)
Beta 2: 6.6 % — ABNORMAL HIGH (ref 3.2–6.5)
Beta Globulin: 6.7 % (ref 4.7–7.2)
Gamma Globulin: 16.7 % (ref 11.1–18.8)
M-Spike, %: NOT DETECTED g/dL
Total Protein ELP: 4.5 g/dL — ABNORMAL LOW (ref 6.0–8.3)

## 2013-07-30 LAB — UIFE/LIGHT CHAINS/TP QN, 24-HR UR
Albumin, U: DETECTED
Alpha 1, Urine: DETECTED — AB
Alpha 2, Urine: DETECTED — AB
Beta, Urine: DETECTED — AB
Free Kappa Lt Chains,Ur: 20.2 mg/dL — ABNORMAL HIGH (ref 0.14–2.42)
Free Kappa/Lambda Ratio: 5.63 ratio (ref 2.04–10.37)
Free Lambda Lt Chains,Ur: 3.59 mg/dL — ABNORMAL HIGH (ref 0.02–0.67)
Gamma Globulin, Urine: DETECTED — AB
Total Protein, Urine: 71.7 mg/dL

## 2013-07-30 LAB — GLUCOSE, CAPILLARY: Glucose-Capillary: 85 mg/dL (ref 70–99)

## 2013-07-30 MED ORDER — CARVEDILOL 6.25 MG PO TABS: 6.2500 mg | ORAL_TABLET | Freq: Two times a day (BID) | ORAL | Status: DC

## 2013-07-30 MED ORDER — HYDROCODONE-ACETAMINOPHEN 5-325 MG PO TABS: 1.0000 | ORAL_TABLET | Freq: Four times a day (QID) | ORAL | Status: DC | PRN

## 2013-07-30 MED ORDER — CARVEDILOL 6.25 MG PO TABS
6.2500 mg | ORAL_TABLET | Freq: Two times a day (BID) | ORAL | Status: DC
  Filled 2013-07-30 (×2): qty 1

## 2013-07-30 NOTE — Progress Notes (Signed)
FMTS ATTENDING  NOTE Lori-Ann Lindfors,MD I  have seen and examined this patient, reviewed their chart. I have discussed this patient with the resident. I agree with the resident's findings, assessment and care plan. 

## 2013-07-30 NOTE — Progress Notes (Signed)
Family Medicine Teaching Service Daily Progress Note Intern Pager: [phone]  Patient name: [name] Medical record number: [id number] Date of birth: Mar 06, 1941 Age: 73 y.o. Gender: female  Primary Care Provider: Sherrie Mustache, MD Consultants: Renal Code Status: DNR  Chief Complaint: abnormal labs  Assessment and Plan: [name] is a 73 y.o. female presenting with worsening fatigue/malaise and renal failure. PMH is significant for HTN, HLD, DM, CKD.  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 Good urine output. Renal U/s with medical renal disease. Fena >6 so likely intrinsic disease. 1.45L output. - f/u spep/upep -appreciate renal consult, Not likely HD at this time. Mature fistula in place with palpable thrill -KVO fluids -on bicarb per renal for metabolic acidosis - consider discontinuing lasix because of increasing creatinine  Anemia and Left leg hematoma Anemia due to CKD likely based off iron studies. Hgb stable from 8-9 range. 7.8 stable today.  - Aranesp per renal, will touch base if we could hold off while patient going to SNF - no DVT on u/s - does not appear to be cellulitis- d/c antibiotics - f/u FOBT (Needs to be collected)  CV: hx of HTN on coreg and amlodipine (initially held for bradycardia); no evidence of fluid overload on exam. -negative i stat troponon, EKG without ischemia signs or peaked t waves.   -amlodipine 10mg  -will add back coreg 6.25BID as pulse able to tolerate  Diabetes (diet controlled) a1c of 5.2, no sliding scale, TSH also normal  FEN/GI: KVO Prophylaxis: unilateral SCD given hematoma.   Disposition: pending placement and discussion with renal re-aranesp  Subjective: No complaints this morning. Did not remember me from admission, thought I was the nurse  Objective: Temp:  [98 F (36.7 C)-98.9 F (37.2 C)] 98 F (36.7 C) (04/28 0412) Pulse Rate:  [68-89] 71 (04/28  0412) Resp:  [16-18] 16 (04/28 0412) BP: (151-168)/(58-76) 168/76 mmHg (04/28 0412) SpO2:  [97 %-99 %] 97 % (04/28 0412) Weight:  [221 lb 1.9 oz (100.3 kg)] 221 lb 1.9 oz (100.3 kg) (04/28 0412)  Physical Exam: General: NAD, laying in bed HEENT: Mucous membranes are moist. Cardiovascular: RRR, no mrg Respiratory: CTAB no wheezes, rales or rhonchi Abdomen: soft/nontender/non-distended, normal bowel sounds Extremities: left lower leg with anterolateral hematoma from just superior to lateral maleolus to inferior to the tibial tuberosity, mild warmth to the skin overlying the hematoma, not expanding; mature fistula in left arm with palpable thrill and good bruit. Left leg noticeably larger than right leg with no calf tenderness.  Laboratory:  Recent Labs Lab 07/28/13 0539 07/29/13 0615 07/30/13 0605  WBC 6.2 6.3 6.8  HGB 8.0* 7.6* 7.8*  HCT 26.0* 24.7* 25.2*  PLT 221 221 209    Recent Labs Lab 07/27/13 0913 07/28/13 0539 07/29/13 0615  NA 141 147 145  K 5.5* 4.2 4.6  CL 112 112 111  CO2 12* 18* 21  BUN 46* 47* 48*  CREATININE 3.63* 4.10* 4.23*  CALCIUM 8.5 8.3* 8.7  GLUCOSE 100* 90 85    Imaging/Diagnostic Tests: No results found.  Langston Masker, MD 07/30/2013, 7:29 AM PGY-1, [place] Intern pager: [phone], text pages welcome

## 2013-07-30 NOTE — Telephone Encounter (Signed)
Patient rx faxed to [facility]

## 2013-07-30 NOTE — Progress Notes (Signed)
PT Cancellation Note  Patient Details Name: [name] MRN: [id number] DOB: Jan 10, 1941   Cancelled Treatment:    Reason Eval/Treat Not Completed: Other (comment)  Noted dc summary is on board for pt to dc to SNF  Spoke with Marcia Brash, who informed me that there is no need for PT eval for approval for DC to SNF  Will defer PT to SNF  Signing off  Thank you,  Roney Marion, [place] Pager [phone] Office [phone]    [place] 07/30/2013, 10:10 AM

## 2013-07-30 NOTE — Progress Notes (Signed)
OT Cancellation Note  Patient Details Name: [name] MRN: [id number] DOB: [birth year]   Cancelled Treatment:    Reason Eval/Treat Not Completed: Other (comment). Noted dc summary is on board for pt to dc to SNF and OT eval not needed for SNF admission. Will defer OT intervention to SNF   Mosetta Putt 07/30/2013, 10:57 AM

## 2013-07-30 NOTE — Progress Notes (Addendum)
SNFs unable to take pt if she discharges on aranesp. Her care (this med specifically) is too expensive for SNFs to accommodate. Paged MD to inform.  Addendum: [facility] able to take pt on Aranesp. Paged MD to inform. MD to sign FL2 on chart and complete discharge summary. Called facility liaison, who will come speak with pt and answer any questions she has about facility.  Ky Barban, MSW, [facility] Clinical Social Worker [phone]

## 2013-07-30 NOTE — Discharge Summary (Signed)
FMTS ATTENDING  NOTE Michelle Derringer,MD I  have seen and examined this patient, reviewed their chart. I have discussed this patient with the resident. I agree with the resident's findings, assessment and care plan. 

## 2013-08-02 ENCOUNTER — Encounter: Payer: Self-pay | Admitting: Internal Medicine

## 2013-08-02 ENCOUNTER — Non-Acute Institutional Stay (SKILLED_NURSING_FACILITY): Payer: PRIVATE HEALTH INSURANCE | Admitting: Internal Medicine

## 2013-08-02 DIAGNOSIS — I10 Essential (primary) hypertension: Secondary | ICD-10-CM

## 2013-08-02 DIAGNOSIS — D631 Anemia in chronic kidney disease: Secondary | ICD-10-CM

## 2013-08-02 DIAGNOSIS — R5381 Other malaise: Secondary | ICD-10-CM

## 2013-08-02 DIAGNOSIS — N039 Chronic nephritic syndrome with unspecified morphologic changes: Secondary | ICD-10-CM

## 2013-08-02 DIAGNOSIS — N189 Chronic kidney disease, unspecified: Secondary | ICD-10-CM

## 2013-08-02 DIAGNOSIS — E039 Hypothyroidism, unspecified: Secondary | ICD-10-CM

## 2013-08-02 DIAGNOSIS — N179 Acute kidney failure, unspecified: Secondary | ICD-10-CM

## 2013-08-02 DIAGNOSIS — E559 Vitamin D deficiency, unspecified: Secondary | ICD-10-CM

## 2013-08-02 DIAGNOSIS — M109 Gout, unspecified: Secondary | ICD-10-CM

## 2013-08-02 NOTE — Progress Notes (Signed)
Patient ID: [name], female   DOB: May 19, 1940, 73 y.o.   MRN: [id number]     [name] living [place]   PCP: Sherrie Mustache, MD  Code Status: full code  Allergies  Allergen Reactions  . Sulfa Drugs Cross Reactors Hives and Itching  . Amoxicillin Rash  . Percocet [Oxycodone-Acetaminophen] Itching and Rash    Chief Complaint: new admit  HPI:  73 y/o female patient is here for STR after hospital admission from 07/26/13- 07/30/13 with acute kidney injury and metabolic acidosis. Nephrology was consulted. She was started on bicarbonate. She was not started on dialysis as her renal function improved. With her deconditioning, she was sent to SNF. She was seen in her room today. She has worked with therapy team today and feels tired at present. She denies any other complaints. No new concerns from staff  Review of Systems:  Constitutional: Negative for fever, chills, weight loss, diaphoresis.  HENT: Negative for congestion, hearing loss and sore throat.   Eyes: Negative for eye pain, blurred vision, double vision and discharge.  Respiratory: Negative for cough, sputum production, wheezing.  has dyspnea with exertion Cardiovascular: Negative for chest pain, palpitations, orthopnea and leg swelling.  Gastrointestinal: Negative for heartburn, nausea, vomiting, abdominal pain, diarrhea and constipation.  Genitourinary: Negative for dysuria, urgency, frequency, hematuria and flank pain.  Musculoskeletal: Negative for back pain, falls, joint pain and myalgias.  Skin: Negative for itching and rash.  Neurological: Negative for dizziness, tingling, focal weakness and headaches.  Psychiatric/Behavioral: Negative for depression and memory loss. The patient is not nervous/anxious.     Past Medical History  Diagnosis Date  . Thyroid disease   . High cholesterol   . Hypertension     sees Dr. Dione Housekeeper  . Diabetes mellitus     "diet controlled"  . Kidney failure     sees Dr. Justin Mend    . Arthritis    Past Surgical History  Procedure Laterality Date  . Joint replacement      bilateral knee  . Joint replacement      shoulder  . Total knee arthroplasty      right knee  . Knee surgery      left  . Shoulder surgery      right shoulder  . Eye surgery      bilateral cataract removal  . Dilation and curettage of uterus    . Tubal ligation    . Av fistula placement  04/03/2012    Procedure: ARTERIOVENOUS (AV) FISTULA CREATION;  Surgeon: Elam Dutch, MD;  Location: [facility] OR;  Service: Vascular;  Laterality: Left;  creation left brachial cephalic fistula    Social History:   reports that she has quit smoking. Her smoking use included Cigarettes. She smoked 0.00 packs per day. She has never used smokeless tobacco. She reports that she does not drink alcohol or use illicit drugs.  Family History  Problem Relation Age of Onset  . Diabetes Sister   . Cancer Brother   . Hyperlipidemia Daughter   . Hypertension Daughter   . Hypertension Son     Medications: Patient's Medications  New Prescriptions   No medications on file  Previous Medications   ACETAMINOPHEN (TYLENOL) 325 MG TABLET    Take 650 mg by mouth every 6 (six) hours as needed for mild pain.   AMLODIPINE (NORVASC) 10 MG TABLET    Take 1 tablet (10 mg total) by mouth daily.   ASPIRIN EC 81 MG TABLET  Take 81 mg by mouth daily.     COLCHICINE 0.6 MG TABLET    Take 0.6 mg by mouth daily.     DARBEPOETIN (ARANESP) 60 MCG/0.3ML SOLN INJECTION    Inject 0.3 mLs (60 mcg total) into the skin every Monday at 6 PM.   EZETIMIBE (ZETIA) 10 MG TABLET    Take 10 mg by mouth daily.    FUROSEMIDE (LASIX) 20 MG TABLET    Take 1 tablet (20 mg total) by mouth daily.   HYDROCODONE-ACETAMINOPHEN (NORCO/VICODIN) 5-325 MG PER TABLET    Take 1 tablet by mouth every 6 (six) hours as needed for moderate pain.   LEVOTHYROXINE (SYNTHROID, LEVOTHROID) 100 MCG TABLET    Take 100 mcg by mouth daily.     SODIUM BICARBONATE 650 MG  TABLET    Take 2 tablets (1,300 mg total) by mouth 2 (two) times daily.   VITAMIN D, ERGOCALCIFEROL, (DRISDOL) 50000 UNITS CAPS    Take 50,000 Units by mouth every 30 (thirty) days.     ZEMPLAR 1 MCG CAPSULE    Take 1 capsule by mouth every Monday, Wednesday, and Friday.   Modified Medications   No medications on file  Discontinued Medications   CARVEDILOL (COREG) 6.25 MG TABLET    Take 1 tablet (6.25 mg total) by mouth 2 (two) times daily with a meal.     Physical Exam: Filed Vitals:   08/02/13 1200  BP: 169/72  Pulse: 70  Temp: 97.4 F (36.3 C)  Resp: 18  Height: 5\' 3"  (1.6 m)  Weight: 224 lb (101.606 kg)  SpO2: 95%    General- elderly female in no acute distress Head- atraumatic, normocephalic Eyes- PERRLA, EOMI, no pallor, no icterus, no discharge Neck- no lymphadenopathy, no thyromegaly Cardiovascular- normal s1,s2, no murmurs/ rubs/ gallops Respiratory- bilateral clear to auscultation, no wheeze, no rhonchi, no crackles, no use of accessory muscles Abdomen- bowel sounds present, soft, non tender Musculoskeletal- able to move all 4 extremities, no spinal and paraspinal tenderness, using a walker and wheelchair. Has left leg hematoma with erythema present. Normal warmth on palpation and tenderness present.  Neurological- no focal deficit Skin- warm and dry, fistula in left arm with good thrill Psychiatry- alert and oriented to person, place and time, normal mood and affect   Labs reviewed: Basic Metabolic Panel:  Recent Labs  07/27/13 0913 07/28/13 0539 07/29/13 0615  NA 141 147 145  K 5.5* 4.2 4.6  CL 112 112 111  CO2 12* 18* 21  GLUCOSE 100* 90 85  BUN 46* 47* 48*  CREATININE 3.63* 4.10* 4.23*  CALCIUM 8.5 8.3* 8.7  PHOS 5.6* 4.8* 5.2*   Liver Function Tests:  Recent Labs  07/27/13 0913 07/28/13 0539 07/29/13 0615  ALBUMIN 2.5* 2.9* 2.8*   No results found for this basename: LIPASE, AMYLASE,  in the last 8760 hours No results found for this  basename: AMMONIA,  in the last 8760 hours CBC:  Recent Labs  07/12/13 0914  07/26/13 1145  07/28/13 0539 07/29/13 0615 07/30/13 0605  WBC 8.1  --  9.0  < > 6.2 6.3 6.8  NEUTROABS 5.8  --  6.2  --   --   --   --   HGB 7.3*  < > 9.1*  < > 8.0* 7.6* 7.8*  HCT 22.9*  < > 29.2*  < > 26.0* 24.7* 25.2*  MCV 89.5  --  90.4  < > 92.5 92.9 93.0  PLT 349  --  296  < >  221 221 209  < > = values in this interval not displayed. Cardiac Enzymes: No results found for this basename: CKTOTAL, CKMB, CKMBINDEX, TROPONINI,  in the last 8760 hours BNP: No components found with this basename: POCBNP,  CBG:  Recent Labs  07/29/13 1241 07/29/13 1636 07/30/13 0640  GLUCAP 80 96 85   08/02/13 wbc 5.8, hb 8.6, hct 26.7, plt 257, na 139, k 4.7, glu 74, bun 59, cr 4.95, ca 8.7  Assessment/Plan  Deconditioning- in setting of recent AKI and her other co-morbidites. Here for STR. Will have patient work with PT/OT as tolerated to regain strength and restore function.  Fall precautions are in place. On norco prn for pain  Acute on chronic renal failure- monitor renal function. Not on dialysis at present. Continue bicarbonate tablet and zemplar  Anemia- continue aransep for hb < 10.  Vitamin d def- continue vitamin d supplement  Hypothyroidism- continue levothyroxine  Hypertension- continue lasix 20 mg daily and amlodipine with aspirin  Gout- continue colchicine for now, no acute flare up noted  Family/ staff Communication: reviewed care plan with patient and nursing supervisor   Goals of care: short term rehabilitation    Labs/tests ordered: cbc, cmp routine     Blanchie Serve, MD  [facility] Adult Medicine [phone] (Monday-Friday 8 am - 5 pm) [phone] (afterhours)

## 2013-08-04 DIAGNOSIS — R5381 Other malaise: Secondary | ICD-10-CM | POA: Insufficient documentation

## 2013-08-09 ENCOUNTER — Other Ambulatory Visit (HOSPITAL_COMMUNITY): Payer: Self-pay | Admitting: *Deleted

## 2013-08-09 ENCOUNTER — Encounter: Payer: Self-pay | Admitting: Internal Medicine

## 2013-08-09 ENCOUNTER — Non-Acute Institutional Stay (SKILLED_NURSING_FACILITY): Payer: PRIVATE HEALTH INSURANCE | Admitting: Internal Medicine

## 2013-08-09 DIAGNOSIS — M109 Gout, unspecified: Secondary | ICD-10-CM

## 2013-08-09 DIAGNOSIS — E039 Hypothyroidism, unspecified: Secondary | ICD-10-CM

## 2013-08-09 DIAGNOSIS — E785 Hyperlipidemia, unspecified: Secondary | ICD-10-CM

## 2013-08-09 DIAGNOSIS — I10 Essential (primary) hypertension: Secondary | ICD-10-CM

## 2013-08-09 DIAGNOSIS — N189 Chronic kidney disease, unspecified: Secondary | ICD-10-CM

## 2013-08-09 DIAGNOSIS — S8010XA Contusion of unspecified lower leg, initial encounter: Secondary | ICD-10-CM

## 2013-08-09 DIAGNOSIS — R5381 Other malaise: Secondary | ICD-10-CM

## 2013-08-09 NOTE — Progress Notes (Signed)
Patient ID: [name], female   DOB: December 22, 1940, 73 y.o.   MRN: [id number]    Armandina Gemma living [place]  Chief Complaint  Patient presents with  . Discharge Note    discharge visit   Allergies  Allergen Reactions  . Sulfa Drugs Cross Reactors Hives and Itching  . Amoxicillin Rash  . Percocet [Oxycodone-Acetaminophen] Itching and Rash   HPI:   73 y/o female patient is here for STR after hospital admission from 07/26/13- 07/30/13 with acute kidney injury and metabolic acidosis. She has history of ckd. She is seen in her room with her daughter present. She is stable to be discharged home with home health services. She denies any complaints.  Review of Systems:  Constitutional: Negative for fever, chills, weight loss, diaphoresis.  HENT: Negative for congestion, hearing loss and sore throat.   Eyes: Negative for eye pain, blurred vision, double vision and discharge.  Respiratory: Negative for cough, sputum production, wheezing.  has dyspnea with exertion Cardiovascular: Negative for chest pain, palpitations, orthopnea and leg swelling.  Gastrointestinal: Negative for heartburn, nausea, vomiting, abdominal pain, diarrhea and constipation.  Genitourinary: Negative for dysuria, urgency, frequency, hematuria and flank pain.  Musculoskeletal: Negative for back pain, falls, joint pain and myalgias.  Skin: Negative for itching and rash.  Neurological: Negative for dizziness, tingling, focal weakness and headaches.  Psychiatric/Behavioral: Negative for depression and memory loss. The patient is not nervous/anxious.       Past Medical History  Diagnosis Date  . Thyroid disease   . High cholesterol   . Hypertension     sees Dr. Dione Housekeeper  . Diabetes mellitus     "diet controlled"  . Kidney failure     sees Dr. Justin Mend  . Arthritis    Current Outpatient Prescriptions on File Prior to Visit  Medication Sig Dispense Refill  . acetaminophen (TYLENOL) 325 MG tablet Take 650 mg by mouth  every 6 (six) hours as needed for mild pain.      Marland Kitchen amLODipine (NORVASC) 10 MG tablet Take 1 tablet (10 mg total) by mouth daily.  30 tablet  0  . aspirin EC 81 MG tablet Take 81 mg by mouth daily.        . colchicine 0.6 MG tablet Take 0.6 mg by mouth daily.        . darbepoetin (ARANESP) 60 MCG/0.3ML SOLN injection Inject 0.3 mLs (60 mcg total) into the skin every Monday at 6 PM.  4.2 mL  0  . ezetimibe (ZETIA) 10 MG tablet Take 10 mg by mouth daily.       . furosemide (LASIX) 20 MG tablet Take 1 tablet (20 mg total) by mouth daily.  30 tablet  0  . HYDROcodone-acetaminophen (NORCO/VICODIN) 5-325 MG per tablet Take 1 tablet by mouth every 6 (six) hours as needed for moderate pain.  30 tablet  0  . levothyroxine (SYNTHROID, LEVOTHROID) 100 MCG tablet Take 100 mcg by mouth daily.        . sodium bicarbonate 650 MG tablet Take 2 tablets (1,300 mg total) by mouth 2 (two) times daily.  60 tablet  0  . Vitamin D, Ergocalciferol, (DRISDOL) 50000 UNITS CAPS Take 50,000 Units by mouth every 30 (thirty) days.        Marland Kitchen ZEMPLAR 1 MCG capsule Take 1 capsule by mouth every Monday, Wednesday, and Friday.        No current facility-administered medications on file prior to visit.    Physical exam BP 145/60  Pulse 68  Temp(Src) 97.1 F (36.2 C)  Resp 20  Ht 5\' 3"  (1.6 m)  Wt 224 lb (101.606 kg)  BMI 39.69 kg/m2  SpO2 95%  General- elderly female in no acute distress Head- atraumatic, normocephalic Eyes- PERRLA, EOMI, no pallor, no icterus, no discharge Neck- no lymphadenopathy, no thyromegaly Cardiovascular- normal s1,s2, no murmurs/ rubs/ gallops Respiratory- bilateral clear to auscultation, no wheeze, no rhonchi, no crackles, no use of accessory muscles Abdomen- bowel sounds present, soft, non tender Musculoskeletal- able to move all 4 extremities, no spinal and paraspinal tenderness, using a walker and wheelchair. Has left leg hematoma decreased from before with erythema present. Normal warmth  on palpation   Neurological- no focal deficit Skin- warm and dry, fistula in left arm with good thrill Psychiatry- alert and oriented to person, place and time, normal mood and affect  Labs- 08/02/13 wbc 5.8, hb 8.6, hct 26.7, plt 257, na 139, k 4.7, glu 74, bun 59, cr 4.95, ca 8.7 08/09/13 wbc 5.6, hb 8, hct 25.2, plt 218, mcv 86.3  Assessment/Plan  Deconditioning- improved from admission time. Will have her continue to work with PT and OT team at home.   Left leg hematoma- improved some, monitor clinically for signs of increase in size or infection. Continue prn norco, script provided. Continue lasix  Anemia of chronic disease- in setting of her CKD and will get aranesp in renal clinic  HTN- stable on amlodipien and lasix, monitor bp and follow with PCP  CKD- followed closely by renal. Continue sodium bicarb to help prevent acidosis. Continue aransep for her anemia. Continue zemplar  Anemia- continue aransep for hb < 10.  Vitamin d def- continue vitamin d supplement  Hypothyroidism- continue levothyroxine  Gout- continue colchicine for now, no acute flare up noted  hyperlipidemia- continue zetia  Home health service for PT/OT, script of her medication provided and care plan reviewed with pt, her daughter and nursing staff

## 2013-08-12 ENCOUNTER — Encounter (HOSPITAL_COMMUNITY)
Admission: RE | Admit: 2013-08-12 | Discharge: 2013-08-12 | Disposition: A | Discharge status: Home / Self Care | Payer: PRIVATE HEALTH INSURANCE | Source: Ambulatory Visit | Attending: Nephrology | Admitting: Nephrology

## 2013-08-12 VITALS — BP 162/78 | HR 75 | Temp 97.9°F | Resp 20 | Ht 63.0 in | Wt 224.0 lb

## 2013-08-12 DIAGNOSIS — N185 Chronic kidney disease, stage 5: Secondary | ICD-10-CM | POA: Diagnosis not present

## 2013-08-12 DIAGNOSIS — N189 Chronic kidney disease, unspecified: Secondary | ICD-10-CM

## 2013-08-12 DIAGNOSIS — D638 Anemia in other chronic diseases classified elsewhere: Secondary | ICD-10-CM | POA: Insufficient documentation

## 2013-08-12 LAB — POCT HEMOGLOBIN-HEMACUE: Hemoglobin: 9.6 g/dL — ABNORMAL LOW (ref 12.0–15.0)

## 2013-08-12 MED ORDER — DARBEPOETIN ALFA-POLYSORBATE 60 MCG/0.3ML IJ SOLN
60.0000 ug | INTRAMUSCULAR | Status: DC
  Administered 2013-08-12: 60 ug via SUBCUTANEOUS

## 2013-08-12 MED ORDER — FERUMOXYTOL INJECTION 510 MG/17 ML
510.0000 mg | Freq: Once | INTRAVENOUS | Status: AC
  Administered 2013-08-12: 510 mg via INTRAVENOUS
  Filled 2013-08-12: qty 17

## 2013-08-12 MED ORDER — DARBEPOETIN ALFA-POLYSORBATE 60 MCG/0.3ML IJ SOLN
INTRAMUSCULAR | Status: AC
  Filled 2013-08-12: qty 0.3

## 2013-08-12 MED ORDER — FERUMOXYTOL INJECTION 510 MG/17 ML
510.0000 mg | Freq: Once | INTRAVENOUS | Status: DC
  Filled 2013-08-12: qty 17

## 2013-08-12 NOTE — Discharge Instructions (Signed)
Darbepoetin Alfa injection What is this medicine? DARBEPOETIN ALFA (dar be POE e tin AL fa) helps your body make more red blood cells. It is used to treat anemia caused by chronic kidney failure and chemotherapy. This medicine may be used for other purposes; ask your health care provider or pharmacist if you have questions. COMMON BRAND NAME(S): Aranesp What should I tell my health care provider before I take this medicine? They need to know if you have any of these conditions: -blood clotting disorders or history of blood clots -cancer patient not on chemotherapy -cystic fibrosis -heart disease, such as angina, heart failure, or a history of a heart attack -hemoglobin level of 12 g/dL or greater -high blood pressure -low levels of folate, iron, or vitamin B12 -seizures -an unusual or allergic reaction to darbepoetin, erythropoietin, albumin, hamster proteins, latex, other medicines, foods, dyes, or preservatives -pregnant or trying to get pregnant -breast-feeding How should I use this medicine? This medicine is for injection into a vein or under the skin. It is usually given by a health care professional in a hospital or clinic setting. If you get this medicine at home, you will be taught how to prepare and give this medicine. Do not shake the solution before you withdraw a dose. Use exactly as directed. Take your medicine at regular intervals. Do not take your medicine more often than directed. It is important that you put your used needles and syringes in a special sharps container. Do not put them in a trash can. If you do not have a sharps container, call your pharmacist or healthcare provider to get one. Talk to your pediatrician regarding the use of this medicine in children. While this medicine may be used in children as young as 1 year for selected conditions, precautions do apply. Overdosage: If you think you have taken too much of this medicine contact a poison control center or  emergency room at once. NOTE: This medicine is only for you. Do not share this medicine with others. What if I miss a dose? If you miss a dose, take it as soon as you can. If it is almost time for your next dose, take only that dose. Do not take double or extra doses. What may interact with this medicine? Do not take this medicine with any of the following medications: -epoetin alfa This list may not describe all possible interactions. Give your health care provider a list of all the medicines, herbs, non-prescription drugs, or dietary supplements you use. Also tell them if you smoke, drink alcohol, or use illegal drugs. Some items may interact with your medicine. What should I watch for while using this medicine? Visit your prescriber or health care professional for regular checks on your progress and for the needed blood tests and blood pressure measurements. It is especially important for the doctor to make sure your hemoglobin level is in the desired range, to limit the risk of potential side effects and to give you the best benefit. Keep all appointments for any recommended tests. Check your blood pressure as directed. Ask your doctor what your blood pressure should be and when you should contact him or her. As your body makes more red blood cells, you may need to take iron, folic acid, or vitamin B supplements. Ask your doctor or health care provider which products are right for you. If you have kidney disease continue dietary restrictions, even though this medication can make you feel better. Talk with your doctor or health   care professional about the foods you eat and the vitamins that you take. What side effects may I notice from receiving this medicine? Side effects that you should report to your doctor or health care professional as soon as possible: -allergic reactions like skin rash, itching or hives, swelling of the face, lips, or tongue -breathing problems -changes in vision -chest  pain -confusion, trouble speaking or understanding -feeling faint or lightheaded, falls -high blood pressure -muscle aches or pains -pain, swelling, warmth in the leg -rapid weight gain -severe headaches -sudden numbness or weakness of the face, arm or leg -trouble walking, dizziness, loss of balance or coordination -seizures (convulsions) -swelling of the ankles, feet, hands -unusually weak or tired Side effects that usually do not require medical attention (report to your doctor or health care professional if they continue or are bothersome): -diarrhea -fever, chills (flu-like symptoms) -headaches -nausea, vomiting -redness, stinging, or swelling at site where injected This list may not describe all possible side effects. Call your doctor for medical advice about side effects. You may report side effects to FDA at 1-800-FDA-1088. Where should I keep my medicine? Keep out of the reach of children. Store in a refrigerator between 2 and 8 degrees C (36 and 46 degrees F). Do not freeze. Do not shake. Throw away any unused portion if using a single-dose vial. Throw away any unused medicine after the expiration date. NOTE: This sheet is a summary. It may not cover all possible information. If you have questions about this medicine, talk to your doctor, pharmacist, or health care provider.  2014, Elsevier/Gold Standard. (2008-03-04 10:23:57)  

## 2013-08-19 ENCOUNTER — Encounter (HOSPITAL_COMMUNITY)
Admission: RE | Admit: 2013-08-19 | Discharge: 2013-08-19 | Disposition: A | Discharge status: Home / Self Care | Payer: PRIVATE HEALTH INSURANCE | Source: Ambulatory Visit | Attending: Nephrology | Admitting: Nephrology

## 2013-08-19 VITALS — BP 146/63 | HR 59 | Temp 97.7°F | Resp 20

## 2013-08-19 DIAGNOSIS — N189 Chronic kidney disease, unspecified: Secondary | ICD-10-CM

## 2013-08-19 DIAGNOSIS — D638 Anemia in other chronic diseases classified elsewhere: Secondary | ICD-10-CM | POA: Diagnosis not present

## 2013-08-19 LAB — POCT HEMOGLOBIN-HEMACUE: Hemoglobin: 9.1 g/dL — ABNORMAL LOW (ref 12.0–15.0)

## 2013-08-19 MED ORDER — DARBEPOETIN ALFA-POLYSORBATE 60 MCG/0.3ML IJ SOLN
60.0000 ug | INTRAMUSCULAR | Status: DC
  Administered 2013-08-19: 60 ug via SUBCUTANEOUS

## 2013-08-19 MED ORDER — FERUMOXYTOL INJECTION 510 MG/17 ML: 510.0000 mg | Freq: Once | INTRAVENOUS | Status: DC

## 2013-08-19 MED ORDER — FERUMOXYTOL INJECTION 510 MG/17 ML
510.0000 mg | Freq: Once | INTRAVENOUS | Status: DC
  Filled 2013-08-19: qty 17

## 2013-08-19 MED ORDER — DARBEPOETIN ALFA-POLYSORBATE 60 MCG/0.3ML IJ SOLN
INTRAMUSCULAR | Status: AC
  Filled 2013-08-19: qty 0.3

## 2013-08-19 MED ORDER — SODIUM CHLORIDE 0.9 % IV SOLN
510.0000 mg | Freq: Once | INTRAVENOUS | Status: AC
  Administered 2013-08-19: 510 mg via INTRAVENOUS
  Filled 2013-08-19: qty 17

## 2013-08-27 ENCOUNTER — Encounter (HOSPITAL_COMMUNITY)
Admission: RE | Admit: 2013-08-27 | Discharge: 2013-08-27 | Disposition: A | Discharge status: Home / Self Care | Payer: PRIVATE HEALTH INSURANCE | Source: Ambulatory Visit | Attending: Nephrology | Admitting: Nephrology

## 2013-08-27 VITALS — BP 158/68 | HR 73 | Temp 98.3°F

## 2013-08-27 DIAGNOSIS — D638 Anemia in other chronic diseases classified elsewhere: Secondary | ICD-10-CM | POA: Diagnosis not present

## 2013-08-27 DIAGNOSIS — N189 Chronic kidney disease, unspecified: Secondary | ICD-10-CM

## 2013-08-27 LAB — POCT HEMOGLOBIN-HEMACUE: Hemoglobin: 10.7 g/dL — ABNORMAL LOW (ref 12.0–15.0)

## 2013-08-27 MED ORDER — DARBEPOETIN ALFA-POLYSORBATE 60 MCG/0.3ML IJ SOLN
60.0000 ug | INTRAMUSCULAR | Status: DC
  Administered 2013-08-27: 60 ug via SUBCUTANEOUS

## 2013-08-27 MED ORDER — DARBEPOETIN ALFA-POLYSORBATE 60 MCG/0.3ML IJ SOLN
INTRAMUSCULAR | Status: AC
  Administered 2013-08-27: 60 ug via SUBCUTANEOUS
  Filled 2013-08-27: qty 0.3

## 2013-09-02 ENCOUNTER — Encounter (HOSPITAL_COMMUNITY)
Admission: RE | Admit: 2013-09-02 | Discharge: 2013-09-02 | Disposition: A | Discharge status: Home / Self Care | Payer: PRIVATE HEALTH INSURANCE | Source: Ambulatory Visit | Attending: Nephrology | Admitting: Nephrology

## 2013-09-02 VITALS — BP 159/64 | HR 67 | Resp 20

## 2013-09-02 DIAGNOSIS — N189 Chronic kidney disease, unspecified: Secondary | ICD-10-CM

## 2013-09-02 LAB — FERRITIN: Ferritin: 268 ng/mL (ref 10–291)

## 2013-09-02 LAB — IRON AND TIBC
Iron: 85 ug/dL (ref 42–135)
Saturation Ratios: 32 % (ref 20–55)
TIBC: 268 ug/dL (ref 250–470)
UIBC: 183 ug/dL (ref 125–400)

## 2013-09-02 LAB — POCT HEMOGLOBIN-HEMACUE: Hemoglobin: 11.2 g/dL — ABNORMAL LOW (ref 12.0–15.0)

## 2013-09-02 MED ORDER — DARBEPOETIN ALFA-POLYSORBATE 60 MCG/0.3ML IJ SOLN
60.0000 ug | INTRAMUSCULAR | Status: DC
  Administered 2013-09-02: 60 ug via SUBCUTANEOUS

## 2013-09-02 MED ORDER — DARBEPOETIN ALFA-POLYSORBATE 60 MCG/0.3ML IJ SOLN
INTRAMUSCULAR | Status: AC
  Filled 2013-09-02: qty 0.3

## 2013-09-09 ENCOUNTER — Encounter (HOSPITAL_COMMUNITY)
Admission: RE | Admit: 2013-09-09 | Discharge: 2013-09-09 | Disposition: A | Discharge status: Home / Self Care | Payer: PRIVATE HEALTH INSURANCE | Source: Ambulatory Visit | Attending: Nephrology | Admitting: Nephrology

## 2013-09-09 VITALS — BP 163/68 | HR 70 | Temp 97.5°F | Resp 20

## 2013-09-09 DIAGNOSIS — N189 Chronic kidney disease, unspecified: Secondary | ICD-10-CM

## 2013-09-09 LAB — POCT HEMOGLOBIN-HEMACUE: Hemoglobin: 12.2 g/dL (ref 12.0–15.0)

## 2013-09-09 MED ORDER — DARBEPOETIN ALFA-POLYSORBATE 60 MCG/0.3ML IJ SOLN: 60.0000 ug | INTRAMUSCULAR | Status: DC

## 2013-09-23 ENCOUNTER — Encounter (HOSPITAL_COMMUNITY)
Admission: RE | Admit: 2013-09-23 | Discharge: 2013-09-23 | Disposition: A | Discharge status: Home / Self Care | Payer: PRIVATE HEALTH INSURANCE | Source: Ambulatory Visit | Attending: Nephrology | Admitting: Nephrology

## 2013-09-23 LAB — IRON AND TIBC
Iron: 103 ug/dL (ref 42–135)
Saturation Ratios: 35 % (ref 20–55)
TIBC: 294 ug/dL (ref 250–470)
UIBC: 191 ug/dL (ref 125–400)

## 2013-09-23 LAB — FERRITIN: Ferritin: 154 ng/mL (ref 10–291)

## 2013-09-23 LAB — POCT HEMOGLOBIN-HEMACUE: Hemoglobin: 13.3 g/dL (ref 12.0–15.0)

## 2013-09-23 MED ORDER — DARBEPOETIN ALFA-POLYSORBATE 60 MCG/0.3ML IJ SOLN: 60.0000 ug | INTRAMUSCULAR | Status: DC

## 2013-10-07 ENCOUNTER — Encounter (HOSPITAL_COMMUNITY)
Admission: RE | Admit: 2013-10-07 | Discharge: 2013-10-07 | Disposition: A | Discharge status: Home / Self Care | Payer: PRIVATE HEALTH INSURANCE | Source: Ambulatory Visit | Attending: Nephrology | Admitting: Nephrology

## 2013-10-07 VITALS — BP 156/64 | HR 59 | Temp 98.0°F | Resp 20

## 2013-10-07 DIAGNOSIS — N189 Chronic kidney disease, unspecified: Secondary | ICD-10-CM

## 2013-10-07 LAB — POCT HEMOGLOBIN-HEMACUE: Hemoglobin: 12.3 g/dL (ref 12.0–15.0)

## 2013-10-07 MED ORDER — DARBEPOETIN ALFA-POLYSORBATE 60 MCG/0.3ML IJ SOLN: 60.0000 ug | INTRAMUSCULAR | Status: DC

## 2013-10-07 MED ORDER — DARBEPOETIN ALFA-POLYSORBATE 60 MCG/0.3ML IJ SOLN
INTRAMUSCULAR | Status: AC
  Filled 2013-10-07: qty 0.3

## 2013-10-21 ENCOUNTER — Encounter (HOSPITAL_COMMUNITY)
Admission: RE | Admit: 2013-10-21 | Discharge: 2013-10-21 | Disposition: A | Discharge status: Home / Self Care | Payer: PRIVATE HEALTH INSURANCE | Source: Ambulatory Visit | Attending: Nephrology | Admitting: Nephrology

## 2013-10-21 DIAGNOSIS — N189 Chronic kidney disease, unspecified: Secondary | ICD-10-CM | POA: Diagnosis not present

## 2013-10-21 LAB — IRON AND TIBC
Iron: 89 ug/dL (ref 42–135)
Saturation Ratios: 35 % (ref 20–55)
TIBC: 257 ug/dL (ref 250–470)
UIBC: 168 ug/dL (ref 125–400)

## 2013-10-21 LAB — FERRITIN: Ferritin: 140 ng/mL (ref 10–291)

## 2013-10-21 LAB — POCT HEMOGLOBIN-HEMACUE: Hemoglobin: 12.4 g/dL (ref 12.0–15.0)

## 2013-10-21 MED ORDER — DARBEPOETIN ALFA-POLYSORBATE 60 MCG/0.3ML IJ SOLN: 60.0000 ug | INTRAMUSCULAR | Status: DC

## 2013-11-04 ENCOUNTER — Encounter (HOSPITAL_COMMUNITY): Payer: PRIVATE HEALTH INSURANCE

## 2014-01-22 ENCOUNTER — Other Ambulatory Visit: Payer: Self-pay | Admitting: Family Medicine

## 2014-01-22 ENCOUNTER — Ambulatory Visit
Admission: RE | Admit: 2014-01-22 | Discharge: 2014-01-22 | Disposition: A | Discharge status: Home / Self Care | Payer: PRIVATE HEALTH INSURANCE | Source: Ambulatory Visit | Attending: Family Medicine | Admitting: Family Medicine

## 2014-01-22 DIAGNOSIS — M549 Dorsalgia, unspecified: Secondary | ICD-10-CM

## 2014-02-06 ENCOUNTER — Encounter (HOSPITAL_COMMUNITY)
Admission: RE | Admit: 2014-02-06 | Discharge: 2014-02-06 | Disposition: A | Discharge status: Home / Self Care | Payer: PRIVATE HEALTH INSURANCE | Source: Ambulatory Visit | Attending: Nephrology | Admitting: Nephrology

## 2014-02-06 DIAGNOSIS — N185 Chronic kidney disease, stage 5: Secondary | ICD-10-CM | POA: Diagnosis not present

## 2014-02-06 DIAGNOSIS — N189 Chronic kidney disease, unspecified: Secondary | ICD-10-CM

## 2014-02-06 DIAGNOSIS — D638 Anemia in other chronic diseases classified elsewhere: Secondary | ICD-10-CM | POA: Diagnosis present

## 2014-02-06 MED ORDER — DARBEPOETIN ALFA 60 MCG/0.3ML IJ SOSY
60.0000 ug | PREFILLED_SYRINGE | INTRAMUSCULAR | Status: DC
  Administered 2014-02-06: 60 ug via SUBCUTANEOUS

## 2014-02-10 LAB — POCT HEMOGLOBIN-HEMACUE: Hemoglobin: 9.1 g/dL — ABNORMAL LOW (ref 12.0–15.0)

## 2014-02-12 ENCOUNTER — Other Ambulatory Visit (HOSPITAL_COMMUNITY): Payer: Self-pay | Admitting: *Deleted

## 2014-02-13 ENCOUNTER — Encounter (HOSPITAL_COMMUNITY)
Admission: RE | Admit: 2014-02-13 | Discharge: 2014-02-13 | Disposition: A | Discharge status: Home / Self Care | Payer: PRIVATE HEALTH INSURANCE | Source: Ambulatory Visit | Attending: Nephrology | Admitting: Nephrology

## 2014-02-13 DIAGNOSIS — D638 Anemia in other chronic diseases classified elsewhere: Secondary | ICD-10-CM | POA: Diagnosis not present

## 2014-02-13 DIAGNOSIS — N185 Chronic kidney disease, stage 5: Secondary | ICD-10-CM

## 2014-02-13 LAB — IRON AND TIBC
Iron: 76 ug/dL (ref 42–135)
Saturation Ratios: 27 % (ref 20–55)
TIBC: 284 ug/dL (ref 250–470)
UIBC: 208 ug/dL (ref 125–400)

## 2014-02-13 LAB — POCT HEMOGLOBIN-HEMACUE: Hemoglobin: 10 g/dL — ABNORMAL LOW (ref 12.0–15.0)

## 2014-02-13 LAB — FERRITIN: Ferritin: 161 ng/mL (ref 10–291)

## 2014-02-13 MED ORDER — DARBEPOETIN ALFA 60 MCG/0.3ML IJ SOSY
60.0000 ug | PREFILLED_SYRINGE | INTRAMUSCULAR | Status: DC
  Administered 2014-02-13: 60 ug via SUBCUTANEOUS

## 2014-02-13 MED ORDER — DARBEPOETIN ALFA 60 MCG/0.3ML IJ SOSY
PREFILLED_SYRINGE | INTRAMUSCULAR | Status: AC
  Filled 2014-02-13: qty 0.3

## 2014-02-19 ENCOUNTER — Encounter (HOSPITAL_COMMUNITY)
Admission: RE | Admit: 2014-02-19 | Discharge: 2014-02-19 | Disposition: A | Discharge status: Home / Self Care | Payer: PRIVATE HEALTH INSURANCE | Source: Ambulatory Visit | Attending: Nephrology | Admitting: Nephrology

## 2014-02-19 DIAGNOSIS — D638 Anemia in other chronic diseases classified elsewhere: Secondary | ICD-10-CM | POA: Diagnosis not present

## 2014-02-19 DIAGNOSIS — N185 Chronic kidney disease, stage 5: Secondary | ICD-10-CM

## 2014-02-19 LAB — POCT HEMOGLOBIN-HEMACUE
Hemoglobin: 10.8 g/dL — ABNORMAL LOW (ref 12.0–15.0)
Hemoglobin: 12.2 g/dL (ref 12.0–15.0)

## 2014-02-19 MED ORDER — DARBEPOETIN ALFA 60 MCG/0.3ML IJ SOSY
60.0000 ug | PREFILLED_SYRINGE | INTRAMUSCULAR | Status: DC
  Administered 2014-02-19: 60 ug via SUBCUTANEOUS

## 2014-02-19 MED ORDER — DARBEPOETIN ALFA 60 MCG/0.3ML IJ SOSY
PREFILLED_SYRINGE | INTRAMUSCULAR | Status: AC
  Filled 2014-02-19: qty 0.3

## 2014-02-26 ENCOUNTER — Encounter (HOSPITAL_COMMUNITY)
Admission: RE | Admit: 2014-02-26 | Discharge: 2014-02-26 | Disposition: A | Discharge status: Home / Self Care | Payer: PRIVATE HEALTH INSURANCE | Source: Ambulatory Visit | Attending: Nephrology | Admitting: Nephrology

## 2014-02-26 DIAGNOSIS — D638 Anemia in other chronic diseases classified elsewhere: Secondary | ICD-10-CM | POA: Diagnosis not present

## 2014-02-26 DIAGNOSIS — N185 Chronic kidney disease, stage 5: Secondary | ICD-10-CM

## 2014-02-26 LAB — POCT HEMOGLOBIN-HEMACUE: Hemoglobin: 11.8 g/dL — ABNORMAL LOW (ref 12.0–15.0)

## 2014-02-26 MED ORDER — DARBEPOETIN ALFA 60 MCG/0.3ML IJ SOSY
60.0000 ug | PREFILLED_SYRINGE | INTRAMUSCULAR | Status: DC
  Administered 2014-02-26: 60 ug via SUBCUTANEOUS

## 2014-02-26 MED ORDER — DARBEPOETIN ALFA 60 MCG/0.3ML IJ SOSY
PREFILLED_SYRINGE | INTRAMUSCULAR | Status: AC
  Filled 2014-02-26: qty 0.3

## 2014-03-06 ENCOUNTER — Encounter (HOSPITAL_COMMUNITY)
Admission: RE | Admit: 2014-03-06 | Discharge: 2014-03-06 | Disposition: A | Discharge status: Home / Self Care | Payer: PRIVATE HEALTH INSURANCE | Source: Ambulatory Visit | Attending: Nephrology | Admitting: Nephrology

## 2014-03-06 DIAGNOSIS — D638 Anemia in other chronic diseases classified elsewhere: Secondary | ICD-10-CM | POA: Insufficient documentation

## 2014-03-06 DIAGNOSIS — N185 Chronic kidney disease, stage 5: Secondary | ICD-10-CM | POA: Insufficient documentation

## 2014-03-06 LAB — POCT HEMOGLOBIN-HEMACUE: Hemoglobin: 12.3 g/dL (ref 12.0–15.0)

## 2014-03-06 MED ORDER — DARBEPOETIN ALFA 60 MCG/0.3ML IJ SOSY: 60.0000 ug | PREFILLED_SYRINGE | INTRAMUSCULAR | Status: DC

## 2014-03-20 ENCOUNTER — Encounter (HOSPITAL_COMMUNITY)
Admission: RE | Admit: 2014-03-20 | Discharge: 2014-03-20 | Disposition: A | Discharge status: Home / Self Care | Payer: PRIVATE HEALTH INSURANCE | Source: Ambulatory Visit | Attending: Nephrology | Admitting: Nephrology

## 2014-03-20 DIAGNOSIS — D638 Anemia in other chronic diseases classified elsewhere: Secondary | ICD-10-CM | POA: Diagnosis not present

## 2014-03-20 DIAGNOSIS — N185 Chronic kidney disease, stage 5: Secondary | ICD-10-CM

## 2014-03-20 LAB — IRON AND TIBC
Iron: 124 ug/dL (ref 42–135)
Saturation Ratios: 39 % (ref 20–55)
TIBC: 316 ug/dL (ref 250–470)
UIBC: 192 ug/dL (ref 125–400)

## 2014-03-20 LAB — POCT HEMOGLOBIN-HEMACUE: Hemoglobin: 12.5 g/dL (ref 12.0–15.0)

## 2014-03-20 LAB — FERRITIN: Ferritin: 124 ng/mL (ref 10–291)

## 2014-03-20 MED ORDER — DARBEPOETIN ALFA 60 MCG/0.3ML IJ SOSY: 60.0000 ug | PREFILLED_SYRINGE | INTRAMUSCULAR | Status: DC

## 2014-04-01 ENCOUNTER — Other Ambulatory Visit (HOSPITAL_COMMUNITY): Payer: Self-pay | Admitting: Adult Health Nurse Practitioner

## 2014-04-01 ENCOUNTER — Other Ambulatory Visit: Payer: Self-pay | Admitting: Adult Health Nurse Practitioner

## 2014-04-01 DIAGNOSIS — G8929 Other chronic pain: Secondary | ICD-10-CM

## 2014-04-01 DIAGNOSIS — M545 Low back pain, unspecified: Secondary | ICD-10-CM

## 2014-04-01 DIAGNOSIS — M549 Dorsalgia, unspecified: Secondary | ICD-10-CM

## 2014-04-03 ENCOUNTER — Encounter (HOSPITAL_COMMUNITY)
Admission: RE | Admit: 2014-04-03 | Discharge: 2014-04-03 | Disposition: A | Discharge status: Home / Self Care | Payer: PRIVATE HEALTH INSURANCE | Source: Ambulatory Visit | Attending: Nephrology | Admitting: Nephrology

## 2014-04-03 DIAGNOSIS — D638 Anemia in other chronic diseases classified elsewhere: Secondary | ICD-10-CM | POA: Diagnosis not present

## 2014-04-03 DIAGNOSIS — N185 Chronic kidney disease, stage 5: Secondary | ICD-10-CM

## 2014-04-03 LAB — POCT HEMOGLOBIN-HEMACUE: Hemoglobin: 11.7 g/dL — ABNORMAL LOW (ref 12.0–15.0)

## 2014-04-03 MED ORDER — DARBEPOETIN ALFA 60 MCG/0.3ML IJ SOSY
60.0000 ug | PREFILLED_SYRINGE | INTRAMUSCULAR | Status: DC
  Administered 2014-04-03: 60 ug via SUBCUTANEOUS

## 2014-04-03 MED ORDER — DARBEPOETIN ALFA 60 MCG/0.3ML IJ SOSY
PREFILLED_SYRINGE | INTRAMUSCULAR | Status: AC
  Filled 2014-04-03: qty 0.3

## 2014-04-09 ENCOUNTER — Ambulatory Visit (HOSPITAL_COMMUNITY): Payer: Medicare Other

## 2014-04-10 ENCOUNTER — Encounter (HOSPITAL_COMMUNITY)
Admission: RE | Admit: 2014-04-10 | Discharge: 2014-04-10 | Disposition: A | Discharge status: Home / Self Care | Payer: Medicare Other | Source: Ambulatory Visit | Attending: Nephrology | Admitting: Nephrology

## 2014-04-10 ENCOUNTER — Ambulatory Visit
Admission: RE | Admit: 2014-04-10 | Discharge: 2014-04-10 | Disposition: A | Discharge status: Home / Self Care | Payer: Medicare Other | Source: Ambulatory Visit | Attending: Adult Health Nurse Practitioner | Admitting: Adult Health Nurse Practitioner

## 2014-04-10 DIAGNOSIS — D638 Anemia in other chronic diseases classified elsewhere: Secondary | ICD-10-CM | POA: Insufficient documentation

## 2014-04-10 DIAGNOSIS — G8929 Other chronic pain: Secondary | ICD-10-CM

## 2014-04-10 DIAGNOSIS — N185 Chronic kidney disease, stage 5: Secondary | ICD-10-CM | POA: Insufficient documentation

## 2014-04-10 DIAGNOSIS — M549 Dorsalgia, unspecified: Secondary | ICD-10-CM

## 2014-04-10 LAB — POCT HEMOGLOBIN-HEMACUE: Hemoglobin: 11.8 g/dL — ABNORMAL LOW (ref 12.0–15.0)

## 2014-04-10 MED ORDER — DARBEPOETIN ALFA 60 MCG/0.3ML IJ SOSY
60.0000 ug | PREFILLED_SYRINGE | INTRAMUSCULAR | Status: DC
  Administered 2014-04-10: 60 ug via SUBCUTANEOUS

## 2014-04-10 MED ORDER — DARBEPOETIN ALFA 60 MCG/0.3ML IJ SOSY
PREFILLED_SYRINGE | INTRAMUSCULAR | Status: AC
  Filled 2014-04-10: qty 0.3

## 2014-04-16 ENCOUNTER — Encounter (HOSPITAL_COMMUNITY)
Admission: RE | Admit: 2014-04-16 | Discharge: 2014-04-16 | Disposition: A | Discharge status: Home / Self Care | Payer: Medicare Other | Source: Ambulatory Visit | Attending: Nephrology | Admitting: Nephrology

## 2014-04-16 DIAGNOSIS — N185 Chronic kidney disease, stage 5: Secondary | ICD-10-CM

## 2014-04-16 DIAGNOSIS — D638 Anemia in other chronic diseases classified elsewhere: Secondary | ICD-10-CM | POA: Insufficient documentation

## 2014-04-16 LAB — FERRITIN: Ferritin: 70 ng/mL (ref 10–291)

## 2014-04-16 LAB — POCT HEMOGLOBIN-HEMACUE: Hemoglobin: 11.5 g/dL — ABNORMAL LOW (ref 12.0–15.0)

## 2014-04-16 MED ORDER — DARBEPOETIN ALFA 60 MCG/0.3ML IJ SOSY
PREFILLED_SYRINGE | INTRAMUSCULAR | Status: AC
  Filled 2014-04-16: qty 0.3

## 2014-04-16 MED ORDER — DARBEPOETIN ALFA 60 MCG/0.3ML IJ SOSY
60.0000 ug | PREFILLED_SYRINGE | INTRAMUSCULAR | Status: DC
  Administered 2014-04-16: 60 ug via SUBCUTANEOUS

## 2014-04-17 LAB — IRON AND TIBC
Iron: 63 ug/dL (ref 42–145)
Saturation Ratios: 21 % (ref 20–55)
TIBC: 294 ug/dL (ref 250–470)
UIBC: 231 ug/dL (ref 125–400)

## 2014-04-23 ENCOUNTER — Encounter (HOSPITAL_COMMUNITY)
Admission: RE | Admit: 2014-04-23 | Discharge: 2014-04-23 | Disposition: A | Discharge status: Home / Self Care | Payer: Medicare Other | Source: Ambulatory Visit | Attending: Nephrology | Admitting: Nephrology

## 2014-04-23 DIAGNOSIS — N185 Chronic kidney disease, stage 5: Secondary | ICD-10-CM

## 2014-04-23 DIAGNOSIS — D638 Anemia in other chronic diseases classified elsewhere: Secondary | ICD-10-CM | POA: Diagnosis not present

## 2014-04-23 MED ORDER — DARBEPOETIN ALFA 60 MCG/0.3ML IJ SOSY: 60.0000 ug | PREFILLED_SYRINGE | INTRAMUSCULAR | Status: DC

## 2014-04-24 LAB — POCT HEMOGLOBIN-HEMACUE: Hemoglobin: 12.2 g/dL (ref 12.0–15.0)

## 2014-05-07 ENCOUNTER — Encounter (HOSPITAL_COMMUNITY)
Admission: RE | Admit: 2014-05-07 | Discharge: 2014-05-07 | Disposition: A | Discharge status: Home / Self Care | Payer: Medicare Other | Source: Ambulatory Visit | Attending: Nephrology | Admitting: Nephrology

## 2014-05-07 DIAGNOSIS — N185 Chronic kidney disease, stage 5: Secondary | ICD-10-CM | POA: Diagnosis not present

## 2014-05-07 DIAGNOSIS — D638 Anemia in other chronic diseases classified elsewhere: Secondary | ICD-10-CM | POA: Insufficient documentation

## 2014-05-07 LAB — POCT HEMOGLOBIN-HEMACUE: Hemoglobin: 12.4 g/dL (ref 12.0–15.0)

## 2014-05-07 MED ORDER — DARBEPOETIN ALFA 60 MCG/0.3ML IJ SOSY: 60.0000 ug | PREFILLED_SYRINGE | INTRAMUSCULAR | Status: DC

## 2014-05-21 ENCOUNTER — Inpatient Hospital Stay (HOSPITAL_COMMUNITY): Admission: RE | Admit: 2014-05-21 | Payer: Medicare Other | Source: Ambulatory Visit

## 2014-05-28 ENCOUNTER — Encounter (HOSPITAL_COMMUNITY)
Admission: RE | Admit: 2014-05-28 | Discharge: 2014-05-28 | Disposition: A | Discharge status: Home / Self Care | Payer: Medicare Other | Source: Ambulatory Visit | Attending: Nephrology | Admitting: Nephrology

## 2014-05-28 DIAGNOSIS — D638 Anemia in other chronic diseases classified elsewhere: Secondary | ICD-10-CM | POA: Diagnosis not present

## 2014-05-28 DIAGNOSIS — N185 Chronic kidney disease, stage 5: Secondary | ICD-10-CM

## 2014-05-28 LAB — IRON AND TIBC
Iron: 105 ug/dL (ref 42–145)
Saturation Ratios: 35 % (ref 20–55)
TIBC: 301 ug/dL (ref 250–470)
UIBC: 196 ug/dL (ref 125–400)

## 2014-05-28 LAB — POCT HEMOGLOBIN-HEMACUE: Hemoglobin: 11.2 g/dL — ABNORMAL LOW (ref 12.0–15.0)

## 2014-05-28 LAB — FERRITIN: Ferritin: 168 ng/mL (ref 10–291)

## 2014-05-28 MED ORDER — DARBEPOETIN ALFA 60 MCG/0.3ML IJ SOSY
PREFILLED_SYRINGE | INTRAMUSCULAR | Status: AC
  Filled 2014-05-28: qty 0.3

## 2014-05-28 MED ORDER — DARBEPOETIN ALFA 60 MCG/0.3ML IJ SOSY
60.0000 ug | PREFILLED_SYRINGE | INTRAMUSCULAR | Status: DC
  Administered 2014-05-28: 60 ug via SUBCUTANEOUS

## 2014-06-02 ENCOUNTER — Encounter (HOSPITAL_COMMUNITY)
Admission: RE | Admit: 2014-06-02 | Discharge: 2014-06-02 | Disposition: A | Discharge status: Home / Self Care | Payer: Medicare Other | Source: Ambulatory Visit | Attending: Nephrology | Admitting: Nephrology

## 2014-06-02 DIAGNOSIS — D638 Anemia in other chronic diseases classified elsewhere: Secondary | ICD-10-CM | POA: Diagnosis not present

## 2014-06-02 DIAGNOSIS — N185 Chronic kidney disease, stage 5: Secondary | ICD-10-CM

## 2014-06-02 LAB — POCT HEMOGLOBIN-HEMACUE: Hemoglobin: 10.9 g/dL — ABNORMAL LOW (ref 12.0–15.0)

## 2014-06-02 MED ORDER — DARBEPOETIN ALFA 60 MCG/0.3ML IJ SOSY
60.0000 ug | PREFILLED_SYRINGE | INTRAMUSCULAR | Status: DC
  Administered 2014-06-02: 60 ug via SUBCUTANEOUS

## 2014-06-03 MED ORDER — DARBEPOETIN ALFA 60 MCG/0.3ML IJ SOSY
PREFILLED_SYRINGE | INTRAMUSCULAR | Status: AC
  Administered 2014-06-02: 60 ug via SUBCUTANEOUS
  Filled 2014-06-03: qty 0.3

## 2014-06-09 ENCOUNTER — Encounter (HOSPITAL_COMMUNITY): Payer: Medicare Other

## 2014-06-12 ENCOUNTER — Encounter (HOSPITAL_COMMUNITY)
Admission: RE | Admit: 2014-06-12 | Discharge: 2014-06-12 | Disposition: A | Discharge status: Home / Self Care | Payer: Medicare Other | Source: Ambulatory Visit | Attending: Nephrology | Admitting: Nephrology

## 2014-06-12 DIAGNOSIS — D638 Anemia in other chronic diseases classified elsewhere: Secondary | ICD-10-CM | POA: Insufficient documentation

## 2014-06-12 DIAGNOSIS — N185 Chronic kidney disease, stage 5: Secondary | ICD-10-CM | POA: Insufficient documentation

## 2014-06-12 DIAGNOSIS — N189 Chronic kidney disease, unspecified: Secondary | ICD-10-CM

## 2014-06-12 MED ORDER — DARBEPOETIN ALFA 60 MCG/0.3ML IJ SOSY: 60.0000 ug | PREFILLED_SYRINGE | INTRAMUSCULAR | Status: DC

## 2014-06-13 LAB — POCT HEMOGLOBIN-HEMACUE: Hemoglobin: 12.2 g/dL (ref 12.0–15.0)

## 2014-06-24 ENCOUNTER — Encounter (HOSPITAL_COMMUNITY)
Admission: RE | Admit: 2014-06-24 | Discharge: 2014-06-24 | Disposition: A | Discharge status: Home / Self Care | Payer: Medicare Other | Source: Ambulatory Visit | Attending: Nephrology | Admitting: Nephrology

## 2014-06-24 DIAGNOSIS — N185 Chronic kidney disease, stage 5: Secondary | ICD-10-CM

## 2014-06-24 DIAGNOSIS — D638 Anemia in other chronic diseases classified elsewhere: Secondary | ICD-10-CM | POA: Diagnosis not present

## 2014-06-24 LAB — IRON AND TIBC
Iron: 132 ug/dL (ref 42–145)
Saturation Ratios: 45 % (ref 20–55)
TIBC: 296 ug/dL (ref 250–470)
UIBC: 164 ug/dL (ref 125–400)

## 2014-06-24 LAB — POCT HEMOGLOBIN-HEMACUE: Hemoglobin: 11.5 g/dL — ABNORMAL LOW (ref 12.0–15.0)

## 2014-06-24 MED ORDER — DARBEPOETIN ALFA 60 MCG/0.3ML IJ SOSY
60.0000 ug | PREFILLED_SYRINGE | INTRAMUSCULAR | Status: DC
  Administered 2014-06-24: 60 ug via SUBCUTANEOUS

## 2014-06-24 MED ORDER — DARBEPOETIN ALFA 60 MCG/0.3ML IJ SOSY
PREFILLED_SYRINGE | INTRAMUSCULAR | Status: AC
  Filled 2014-06-24: qty 0.3

## 2014-06-25 LAB — FERRITIN: Ferritin: 121 ng/mL (ref 10–291)

## 2014-06-26 ENCOUNTER — Encounter (HOSPITAL_COMMUNITY): Payer: Medicare Other

## 2014-07-02 ENCOUNTER — Encounter (HOSPITAL_COMMUNITY)
Admission: RE | Admit: 2014-07-02 | Discharge: 2014-07-02 | Disposition: A | Discharge status: Home / Self Care | Payer: Medicare Other | Source: Ambulatory Visit | Attending: Nephrology | Admitting: Nephrology

## 2014-07-02 DIAGNOSIS — D638 Anemia in other chronic diseases classified elsewhere: Secondary | ICD-10-CM | POA: Diagnosis not present

## 2014-07-02 DIAGNOSIS — N185 Chronic kidney disease, stage 5: Secondary | ICD-10-CM

## 2014-07-02 LAB — POCT HEMOGLOBIN-HEMACUE: Hemoglobin: 11.7 g/dL — ABNORMAL LOW (ref 12.0–15.0)

## 2014-07-02 MED ORDER — DARBEPOETIN ALFA 60 MCG/0.3ML IJ SOSY
60.0000 ug | PREFILLED_SYRINGE | INTRAMUSCULAR | Status: DC
  Administered 2014-07-02: 60 ug via SUBCUTANEOUS

## 2014-07-02 MED ORDER — DARBEPOETIN ALFA 60 MCG/0.3ML IJ SOSY
PREFILLED_SYRINGE | INTRAMUSCULAR | Status: AC
  Filled 2014-07-02: qty 0.3

## 2014-07-09 ENCOUNTER — Encounter (HOSPITAL_COMMUNITY)
Admission: RE | Admit: 2014-07-09 | Discharge: 2014-07-09 | Disposition: A | Discharge status: Home / Self Care | Payer: Medicare Other | Source: Ambulatory Visit | Attending: Nephrology | Admitting: Nephrology

## 2014-07-09 DIAGNOSIS — N185 Chronic kidney disease, stage 5: Secondary | ICD-10-CM | POA: Diagnosis not present

## 2014-07-09 DIAGNOSIS — D638 Anemia in other chronic diseases classified elsewhere: Secondary | ICD-10-CM | POA: Diagnosis present

## 2014-07-09 LAB — POCT HEMOGLOBIN-HEMACUE: Hemoglobin: 12.4 g/dL (ref 12.0–15.0)

## 2014-07-09 MED ORDER — DARBEPOETIN ALFA 60 MCG/0.3ML IJ SOSY: 60.0000 ug | PREFILLED_SYRINGE | INTRAMUSCULAR | Status: DC

## 2014-07-21 ENCOUNTER — Other Ambulatory Visit (HOSPITAL_COMMUNITY): Payer: Self-pay | Admitting: *Deleted

## 2014-07-22 ENCOUNTER — Encounter (HOSPITAL_COMMUNITY)
Admission: RE | Admit: 2014-07-22 | Discharge: 2014-07-22 | Disposition: A | Discharge status: Home / Self Care | Payer: Medicare Other | Source: Ambulatory Visit | Attending: Nephrology | Admitting: Nephrology

## 2014-07-22 DIAGNOSIS — N185 Chronic kidney disease, stage 5: Secondary | ICD-10-CM | POA: Diagnosis present

## 2014-07-22 DIAGNOSIS — D631 Anemia in chronic kidney disease: Secondary | ICD-10-CM | POA: Insufficient documentation

## 2014-07-22 LAB — POCT HEMOGLOBIN-HEMACUE: Hemoglobin: 12 g/dL (ref 12.0–15.0)

## 2014-07-22 MED ORDER — DARBEPOETIN ALFA 60 MCG/0.3ML IJ SOSY: 60.0000 ug | PREFILLED_SYRINGE | INTRAMUSCULAR | Status: DC

## 2014-07-23 LAB — IRON AND TIBC
Iron: 168 ug/dL — ABNORMAL HIGH (ref 42–145)
Saturation Ratios: 56 % — ABNORMAL HIGH (ref 20–55)
TIBC: 300 ug/dL (ref 250–470)
UIBC: 132 ug/dL (ref 125–400)

## 2014-07-23 LAB — FERRITIN: Ferritin: 93 ng/mL (ref 10–291)

## 2014-08-05 ENCOUNTER — Encounter (HOSPITAL_COMMUNITY): Payer: Medicare Other

## 2014-08-18 ENCOUNTER — Other Ambulatory Visit (HOSPITAL_COMMUNITY): Payer: Self-pay | Admitting: *Deleted

## 2014-08-19 ENCOUNTER — Encounter (HOSPITAL_COMMUNITY)
Admission: RE | Admit: 2014-08-19 | Discharge: 2014-08-19 | Disposition: A | Discharge status: Home / Self Care | Payer: Medicare Other | Source: Ambulatory Visit | Attending: Nephrology | Admitting: Nephrology

## 2014-08-19 DIAGNOSIS — D638 Anemia in other chronic diseases classified elsewhere: Secondary | ICD-10-CM | POA: Insufficient documentation

## 2014-08-19 DIAGNOSIS — N185 Chronic kidney disease, stage 5: Secondary | ICD-10-CM | POA: Insufficient documentation

## 2014-08-19 LAB — IRON AND TIBC
Iron: 116 ug/dL (ref 28–170)
Saturation Ratios: 38 % — ABNORMAL HIGH (ref 10.4–31.8)
TIBC: 305 ug/dL (ref 250–450)
UIBC: 189 ug/dL

## 2014-08-19 LAB — FERRITIN: Ferritin: 127 ng/mL (ref 11–307)

## 2014-08-19 LAB — POCT HEMOGLOBIN-HEMACUE: Hemoglobin: 10.9 g/dL — ABNORMAL LOW (ref 12.0–15.0)

## 2014-08-19 MED ORDER — DARBEPOETIN ALFA 60 MCG/0.3ML IJ SOSY
60.0000 ug | PREFILLED_SYRINGE | INTRAMUSCULAR | Status: DC
  Administered 2014-08-19: 60 ug via SUBCUTANEOUS

## 2014-08-19 MED ORDER — DARBEPOETIN ALFA 60 MCG/0.3ML IJ SOSY
PREFILLED_SYRINGE | INTRAMUSCULAR | Status: AC
  Filled 2014-08-19: qty 0.3

## 2014-09-16 ENCOUNTER — Encounter (HOSPITAL_COMMUNITY)
Admission: RE | Admit: 2014-09-16 | Discharge: 2014-09-16 | Disposition: A | Discharge status: Home / Self Care | Payer: Medicare Other | Source: Ambulatory Visit | Attending: Nephrology | Admitting: Nephrology

## 2014-09-16 DIAGNOSIS — N185 Chronic kidney disease, stage 5: Secondary | ICD-10-CM | POA: Diagnosis not present

## 2014-09-16 DIAGNOSIS — D638 Anemia in other chronic diseases classified elsewhere: Secondary | ICD-10-CM | POA: Insufficient documentation

## 2014-09-16 LAB — IRON AND TIBC
Iron: 120 ug/dL (ref 28–170)
Saturation Ratios: 40 % — ABNORMAL HIGH (ref 10.4–31.8)
TIBC: 298 ug/dL (ref 250–450)
UIBC: 178 ug/dL

## 2014-09-16 LAB — POCT HEMOGLOBIN-HEMACUE: Hemoglobin: 10.9 g/dL — ABNORMAL LOW (ref 12.0–15.0)

## 2014-09-16 LAB — FERRITIN: Ferritin: 104 ng/mL (ref 11–307)

## 2014-09-16 MED ORDER — DARBEPOETIN ALFA 60 MCG/0.3ML IJ SOSY
PREFILLED_SYRINGE | INTRAMUSCULAR | Status: AC
  Filled 2014-09-16: qty 0.3

## 2014-09-16 MED ORDER — DARBEPOETIN ALFA 60 MCG/0.3ML IJ SOSY
60.0000 ug | PREFILLED_SYRINGE | INTRAMUSCULAR | Status: DC
  Administered 2014-09-16: 60 ug via SUBCUTANEOUS

## 2014-10-14 ENCOUNTER — Encounter (HOSPITAL_COMMUNITY)
Admission: RE | Admit: 2014-10-14 | Discharge: 2014-10-14 | Disposition: A | Discharge status: Home / Self Care | Payer: Medicare Other | Source: Ambulatory Visit | Attending: Nephrology | Admitting: Nephrology

## 2014-10-14 DIAGNOSIS — Z79899 Other long term (current) drug therapy: Secondary | ICD-10-CM | POA: Insufficient documentation

## 2014-10-14 DIAGNOSIS — Z5181 Encounter for therapeutic drug level monitoring: Secondary | ICD-10-CM | POA: Diagnosis not present

## 2014-10-14 DIAGNOSIS — N185 Chronic kidney disease, stage 5: Secondary | ICD-10-CM | POA: Insufficient documentation

## 2014-10-14 DIAGNOSIS — D631 Anemia in chronic kidney disease: Secondary | ICD-10-CM | POA: Diagnosis not present

## 2014-10-14 LAB — POCT HEMOGLOBIN-HEMACUE: Hemoglobin: 11 g/dL — ABNORMAL LOW (ref 12.0–15.0)

## 2014-10-14 LAB — IRON AND TIBC
Iron: 118 ug/dL (ref 28–170)
Saturation Ratios: 40 % — ABNORMAL HIGH (ref 10.4–31.8)
TIBC: 294 ug/dL (ref 250–450)
UIBC: 176 ug/dL

## 2014-10-14 LAB — FERRITIN: Ferritin: 131 ng/mL (ref 11–307)

## 2014-10-14 MED ORDER — DARBEPOETIN ALFA 60 MCG/0.3ML IJ SOSY
60.0000 ug | PREFILLED_SYRINGE | INTRAMUSCULAR | Status: DC
  Administered 2014-10-14: 60 ug via SUBCUTANEOUS

## 2014-10-14 MED ORDER — DARBEPOETIN ALFA 60 MCG/0.3ML IJ SOSY
PREFILLED_SYRINGE | INTRAMUSCULAR | Status: AC
  Filled 2014-10-14: qty 0.3

## 2014-11-11 ENCOUNTER — Other Ambulatory Visit (HOSPITAL_COMMUNITY): Payer: Self-pay | Admitting: *Deleted

## 2014-11-12 ENCOUNTER — Encounter (HOSPITAL_COMMUNITY)
Admission: RE | Admit: 2014-11-12 | Discharge: 2014-11-12 | Disposition: A | Discharge status: Home / Self Care | Payer: Medicare Other | Source: Ambulatory Visit | Attending: Nephrology | Admitting: Nephrology

## 2014-11-12 DIAGNOSIS — D638 Anemia in other chronic diseases classified elsewhere: Secondary | ICD-10-CM | POA: Diagnosis present

## 2014-11-12 DIAGNOSIS — N185 Chronic kidney disease, stage 5: Secondary | ICD-10-CM | POA: Diagnosis not present

## 2014-11-12 LAB — FERRITIN: Ferritin: 121 ng/mL (ref 11–307)

## 2014-11-12 LAB — IRON AND TIBC
Iron: 116 ug/dL (ref 28–170)
Saturation Ratios: 40 % — ABNORMAL HIGH (ref 10.4–31.8)
TIBC: 290 ug/dL (ref 250–450)
UIBC: 174 ug/dL

## 2014-11-12 LAB — POCT HEMOGLOBIN-HEMACUE: Hemoglobin: 10.5 g/dL — ABNORMAL LOW (ref 12.0–15.0)

## 2014-11-12 MED ORDER — DARBEPOETIN ALFA 60 MCG/0.3ML IJ SOSY
60.0000 ug | PREFILLED_SYRINGE | INTRAMUSCULAR | Status: DC
  Administered 2014-11-12: 60 ug via SUBCUTANEOUS

## 2014-11-12 MED ORDER — DARBEPOETIN ALFA 60 MCG/0.3ML IJ SOSY
PREFILLED_SYRINGE | INTRAMUSCULAR | Status: AC
  Filled 2014-11-12: qty 0.3

## 2014-11-17 DIAGNOSIS — N2581 Secondary hyperparathyroidism of renal origin: Secondary | ICD-10-CM | POA: Insufficient documentation

## 2014-12-02 ENCOUNTER — Encounter: Payer: Self-pay | Admitting: Vascular Surgery

## 2014-12-03 ENCOUNTER — Other Ambulatory Visit: Payer: Self-pay

## 2014-12-03 ENCOUNTER — Ambulatory Visit (HOSPITAL_COMMUNITY)
Admission: RE | Admit: 2014-12-03 | Discharge: 2014-12-03 | Disposition: A | Discharge status: Home / Self Care | Payer: Medicare Other | Source: Ambulatory Visit | Attending: Vascular Surgery | Admitting: Vascular Surgery

## 2014-12-03 ENCOUNTER — Encounter: Payer: Self-pay | Admitting: Vascular Surgery

## 2014-12-03 ENCOUNTER — Ambulatory Visit (INDEPENDENT_AMBULATORY_CARE_PROVIDER_SITE_OTHER): Payer: Medicare Other | Admitting: Vascular Surgery

## 2014-12-03 VITALS — BP 135/70 | HR 68 | Temp 97.2°F | Resp 14 | Ht 66.0 in | Wt 179.0 lb

## 2014-12-03 DIAGNOSIS — T82898A Other specified complication of vascular prosthetic devices, implants and grafts, initial encounter: Secondary | ICD-10-CM | POA: Diagnosis not present

## 2014-12-03 DIAGNOSIS — T82590D Other mechanical complication of surgically created arteriovenous fistula, subsequent encounter: Secondary | ICD-10-CM

## 2014-12-03 DIAGNOSIS — M7989 Other specified soft tissue disorders: Secondary | ICD-10-CM | POA: Diagnosis not present

## 2014-12-03 DIAGNOSIS — R200 Anesthesia of skin: Secondary | ICD-10-CM | POA: Diagnosis not present

## 2014-12-03 DIAGNOSIS — M79609 Pain in unspecified limb: Secondary | ICD-10-CM | POA: Insufficient documentation

## 2014-12-03 DIAGNOSIS — Z992 Dependence on renal dialysis: Secondary | ICD-10-CM | POA: Diagnosis not present

## 2014-12-03 DIAGNOSIS — N186 End stage renal disease: Secondary | ICD-10-CM

## 2014-12-03 NOTE — Progress Notes (Signed)
[facility]    Referred by:  Dione Housekeeper, MD [address] [place], [place] [address]  Reason for referral: Left hand steal  History of Present Illness  [name] is a 74 y.o. (08-20-1940) female who presents for CC: L hand numbness.  This patient underwent L BC AVF placement on 04/03/12.  The patient has developed progressive more numbness in her L hand, predominantly L 1st-3rd fingers.  This is worst due hemodialysis.  The first and 2nd fingers are now numbness constantly.  She denies problems with hand grip in that hand but notes some difficulty with ADL due to the numbness.   Past Medical History  Diagnosis Date  . Thyroid disease   . High cholesterol   . Hypertension     sees Dr. Dione Housekeeper  . Diabetes mellitus     "diet controlled"  . Kidney failure     sees Dr. Justin Mend  . Arthritis     Past Surgical History  Procedure Laterality Date  . Joint replacement      bilateral knee  . Joint replacement      shoulder  . Total knee arthroplasty      right knee  . Knee surgery      left  . Shoulder surgery      right shoulder  . Eye surgery      bilateral cataract removal  . Dilation and curettage of uterus    . Tubal ligation    . Av fistula placement  04/03/2012    Procedure: ARTERIOVENOUS (AV) FISTULA CREATION;  Surgeon: Elam Dutch, MD;  Location: [facility] OR;  Service: Vascular;  Laterality: Left;  creation left brachial cephalic fistula     Social History   Social History  . Marital Status: Married    Spouse Name: N/A  . Number of Children: N/A  . Years of Education: N/A   Occupational History  . Not on file.   Social History Main Topics  . Smoking status: Former Smoker    Types: Cigarettes    Quit date: 12/02/2004  . Smokeless tobacco: Never Used     Comment: "stopped 5 years ago"  . Alcohol Use: No  . Drug Use: No  . Sexual Activity: Not on file   Other Topics Concern  . Not on file   Social History Narrative     Family History  Problem Relation Age of Onset  . Diabetes Sister   . Cancer Brother   . Hyperlipidemia Daughter   . Hypertension Daughter   . Hypertension Son      Current Outpatient Prescriptions on File Prior to Visit  Medication Sig Dispense Refill  . acetaminophen (TYLENOL) 325 MG tablet Take 650 mg by mouth every 6 (six) hours as needed for mild pain.    Marland Kitchen amLODipine (NORVASC) 10 MG tablet Take 1 tablet (10 mg total) by mouth daily. (Patient taking differently: Take 5 mg by mouth daily. Patient taking 1/2 of the 10 mg tablet per daughter) 30 tablet 0  . aspirin EC 81 MG tablet Take 81 mg by mouth daily.      . cholecalciferol (VITAMIN D) 1000 UNITS tablet Take 5,000 Units by mouth daily.    Marland Kitchen ezetimibe (ZETIA) 10 MG tablet Take 10 mg by mouth daily.     Marland Kitchen HYDROcodone-acetaminophen (NORCO/VICODIN) 5-325 MG per tablet Take 1 tablet by mouth every 6 (six) hours as needed for moderate pain. 30 tablet 0  . levothyroxine (SYNTHROID, LEVOTHROID) 100 MCG  tablet Take 100 mcg by mouth daily.      . cephALEXin (KEFLEX) 500 MG capsule Take 500 mg by mouth 4 (four) times daily. 1-4 times daily    . colchicine 0.6 MG tablet Take 0.6 mg by mouth daily.      . darbepoetin (ARANESP) 60 MCG/0.3ML SOLN injection Inject 0.3 mLs (60 mcg total) into the skin every Monday at 6 PM. (Patient not taking: Reported on 12/03/2014) 4.2 mL 0  . furosemide (LASIX) 20 MG tablet Take 1 tablet (20 mg total) by mouth daily. (Patient not taking: Reported on 12/03/2014) 30 tablet 0  . sodium bicarbonate 650 MG tablet Take 2 tablets (1,300 mg total) by mouth 2 (two) times daily. (Patient not taking: Reported on 12/03/2014) 60 tablet 0  . Vitamin D, Ergocalciferol, (DRISDOL) 50000 UNITS CAPS Take 50,000 Units by mouth every 30 (thirty) days.      Marland Kitchen ZEMPLAR 1 MCG capsule Take 1 capsule by mouth every Monday, Wednesday, and Friday.      No current facility-administered medications on file prior to visit.    Allergies   Allergen Reactions  . Sulfa Drugs Cross Reactors Hives and Itching  . Amoxicillin Rash  . Percocet [Oxycodone-Acetaminophen] Itching and Rash     REVIEW OF SYSTEMS:  (Positives checked otherwise negative)  CARDIOVASCULAR:  []  chest pain, []  chest pressure, []  palpitations, []  shortness of breath when laying flat, []  shortness of breath with exertion,  []  pain in feet when walking, []  pain in feet when laying flat, []  history of blood clot in veins (DVT), []  history of phlebitis, []  swelling in legs, []  varicose veins  PULMONARY:  []  productive cough, []  asthma, []  wheezing  NEUROLOGIC:  []  weakness in arms or legs, []  numbness in arms or legs, []  difficulty speaking or slurred speech, []  temporary loss of vision in one eye, []  dizziness  HEMATOLOGIC:  []  bleeding problems, []  problems with blood clotting too easily  MUSCULOSKEL:  []  joint pain, []  joint swelling  GASTROINTEST:  []  vomiting blood, []  blood in stool     GENITOURINARY:  []  burning with urination, []  blood in urine,[x]  ESRD-HD: M-W-F  PSYCHIATRIC:  []  history of major depression  INTEGUMENTARY:  []  rashes, []  ulcers  CONSTITUTIONAL:  []  fever, []  chills   Physical Examination  Filed Vitals:   12/03/14 1047  BP: 135/70  Pulse: 68  Temp: 97.2 F (36.2 C)  Resp: 14  Height: 5\' 6"  (1.676 m)  Weight: 179 lb (81.194 kg)  SpO2: 97%   Body mass index is 28.91 kg/(m^2).  General: A&O x 3, WD, thin  Head: [place]/AT, Temporalis wasting,   Ear/Nose/Throat: Hearing grossly intact, nares w/o erythema or drainage, oropharynx w/o Erythema/Exudate, Mallampati score: 3  Eyes: PERRLA, EOMI  Neck: Supple, no nuchal rigidity, no palpable LAD  Pulmonary: Sym exp, good air movt, CTAB, no rales, rhonchi, & wheezing  Cardiac: RRR, Nl S1, S2, no Murmurs, rubs or gallops  Vascular: Vessel Right Left  Radial Palpable Not Palpable  Ulnar Not Palpable Not Palpable  Brachial Palpable Palpable  Carotid Palpable, without  bruit Palpable, without bruit  Aorta Not palpable N/A  Femoral Palpable Palpable  Popliteal Not palpable Not palpable  PT Not Palpable Not Palpable  DP Faintly Palpable Faintly Palpable   Gastrointestinal: soft, NTND, -G/R, - HSM, - masses, - CVAT B  Musculoskeletal: M/S 5/5 throughout , Extremities without ischemic changes , aneurysmal L BC AVF, no gangrene or ulcers in left hand  Neurologic: CN 2-12 intact , Pain and light touch intact in extremities , Motor exam as listed above  Psychiatric: Judgment intact, Mood & affect appropriate for pt's clinical situation  Dermatologic: See M/S exam for extremity exam, no rashes otherwise noted  Lymph : No Cervical, Axillary, or Inguinal lymphadenopathy    Non-Invasive Vascular Imaging  LUE Steal Study  (Date: 12/03/2014):   No evidence of hemodynamically significant steal: WBI 0.52 -> 0.56 with AVF compression   Medical Decision Making  MAYCE DYE is a 74 y.o. female who presents with ESRD requiring hemodialysis, L 1-3 digits numbness   Pt's sx are suspicious for steal syndrome but the non-invasive studies are NOT consistent for such  Will refer patient to Hand Surgery for evaluation for carpal tunnel.  If work-up negative, may need to consider plication vs ligation of L BC AVF.  Pt will follow up in 1-2 months after the Hand Surgery evaluation.  Adele Barthel, MD [facility] Office: [phone] Pager: [phone]  12/03/2014, 11:17 AM

## 2014-12-03 NOTE — Addendum Note (Signed)
Addended by: Dorthula Rue L on: 12/03/2014 01:33 PM   Modules accepted: Orders

## 2014-12-09 DIAGNOSIS — D631 Anemia in chronic kidney disease: Secondary | ICD-10-CM | POA: Insufficient documentation

## 2014-12-09 DIAGNOSIS — N189 Chronic kidney disease, unspecified: Secondary | ICD-10-CM | POA: Insufficient documentation

## 2014-12-10 ENCOUNTER — Encounter (HOSPITAL_COMMUNITY): Payer: Medicare Other

## 2014-12-30 ENCOUNTER — Other Ambulatory Visit: Payer: Self-pay

## 2014-12-30 DIAGNOSIS — N186 End stage renal disease: Secondary | ICD-10-CM

## 2014-12-30 DIAGNOSIS — T82898A Other specified complication of vascular prosthetic devices, implants and grafts, initial encounter: Secondary | ICD-10-CM

## 2015-01-02 ENCOUNTER — Other Ambulatory Visit (HOSPITAL_COMMUNITY): Payer: Medicare Other

## 2015-01-02 ENCOUNTER — Ambulatory Visit: Payer: Medicare Other | Admitting: Vascular Surgery

## 2015-01-05 ENCOUNTER — Emergency Department (HOSPITAL_COMMUNITY)
Admission: EM | Admit: 2015-01-05 | Discharge: 2015-01-05 | Disposition: A | Discharge status: Home / Self Care | Payer: Medicare Other | Attending: Emergency Medicine | Admitting: Emergency Medicine

## 2015-01-05 ENCOUNTER — Encounter (HOSPITAL_COMMUNITY): Payer: Self-pay

## 2015-01-05 DIAGNOSIS — D649 Anemia, unspecified: Secondary | ICD-10-CM

## 2015-01-05 DIAGNOSIS — M1990 Unspecified osteoarthritis, unspecified site: Secondary | ICD-10-CM | POA: Diagnosis not present

## 2015-01-05 DIAGNOSIS — Z87448 Personal history of other diseases of urinary system: Secondary | ICD-10-CM | POA: Diagnosis not present

## 2015-01-05 DIAGNOSIS — Z7982 Long term (current) use of aspirin: Secondary | ICD-10-CM | POA: Insufficient documentation

## 2015-01-05 DIAGNOSIS — Y841 Kidney dialysis as the cause of abnormal reaction of the patient, or of later complication, without mention of misadventure at the time of the procedure: Secondary | ICD-10-CM | POA: Diagnosis not present

## 2015-01-05 DIAGNOSIS — Z87891 Personal history of nicotine dependence: Secondary | ICD-10-CM | POA: Diagnosis not present

## 2015-01-05 DIAGNOSIS — R42 Dizziness and giddiness: Secondary | ICD-10-CM | POA: Insufficient documentation

## 2015-01-05 DIAGNOSIS — E079 Disorder of thyroid, unspecified: Secondary | ICD-10-CM | POA: Diagnosis not present

## 2015-01-05 DIAGNOSIS — Z880 Allergy status to penicillin: Secondary | ICD-10-CM | POA: Diagnosis not present

## 2015-01-05 DIAGNOSIS — T82598A Other mechanical complication of other cardiac and vascular devices and implants, initial encounter: Secondary | ICD-10-CM | POA: Diagnosis present

## 2015-01-05 DIAGNOSIS — E7800 Pure hypercholesterolemia, unspecified: Secondary | ICD-10-CM | POA: Insufficient documentation

## 2015-01-05 DIAGNOSIS — E119 Type 2 diabetes mellitus without complications: Secondary | ICD-10-CM | POA: Insufficient documentation

## 2015-01-05 DIAGNOSIS — I10 Essential (primary) hypertension: Secondary | ICD-10-CM | POA: Diagnosis not present

## 2015-01-05 DIAGNOSIS — T82590A Other mechanical complication of surgically created arteriovenous fistula, initial encounter: Secondary | ICD-10-CM

## 2015-01-05 LAB — CBC
HCT: 30 % — ABNORMAL LOW (ref 36.0–46.0)
Hemoglobin: 9.3 g/dL — ABNORMAL LOW (ref 12.0–15.0)
MCH: 30.2 pg (ref 26.0–34.0)
MCHC: 31 g/dL (ref 30.0–36.0)
MCV: 97.4 fL (ref 78.0–100.0)
Platelets: 175 10*3/uL (ref 150–400)
RBC: 3.08 MIL/uL — ABNORMAL LOW (ref 3.87–5.11)
RDW: 14.2 % (ref 11.5–15.5)
WBC: 6 10*3/uL (ref 4.0–10.5)

## 2015-01-05 NOTE — ED Notes (Signed)
Pt arrived via EMS from home c/o left upper arm fistula bleeding.  Bleeding was controlled by fire before EMS arrived.  Pt states she had a scab there and it got caught on her shirt.  Pressure applied.  EMS gave 263ml NS for 86/40 BP brought it up to 120/70.

## 2015-01-05 NOTE — ED Notes (Signed)
Wheelchair brought to room for patient's discharge; patient now in the bathroom at this time

## 2015-01-05 NOTE — ED Notes (Signed)
Patient d/c'd from IV, monitor, continuous pulse oximetry and blood pressure cuff; patient getting dressed to be discharged home 

## 2015-01-05 NOTE — ED Notes (Signed)
Alvino Chapel, MD at bedside at this time

## 2015-01-05 NOTE — Discharge Instructions (Signed)
Dialysis Vascular Access Malfunction °A vascular access is an entrance to your blood vessels that can be used for dialysis. A vascular access can be made in one of several ways:  °· Joining an artery to a vein under your skin to make a bigger blood vessel called a fistula.   °· Joining an artery to a vein under your skin using a soft tube called a graft.   °· Placing a thin, flexible tube (catheter) in a large vein, usually in your neck.   °A vascular access may malfunction or become blocked.  °WHAT CAN CAUSE YOUR VASCULAR ACCESS TO MALFUNCTION? °· Infection (common).   °· A blood clot inside a part of the fistula, graft, or catheter. A blood clot can completely or partially block the flow of blood.   °· A kink in the graft or catheter.   °· A collection of blood (called a hematoma or bruise) next to the graft or catheter that pushes against it, blocking the flow of blood.   °WHAT ARE SIGNS AND SYMPTOMS OF VASCULAR ACCESS MALFUNCTION? °· There is a change in the vibration or pulse of your fistula or graft. °· The vibration or pulse of your fistula or graft is gone.   °· There is new or unusual swelling of the area around the access.   °· There was an unsuccessful puncture of your access by the dialysis team.   °· The flow of blood through the fistula, graft, or catheter is too slow for effective dialysis.   °· When routine dialysis is completed and the needle is removed, bleeding lasts for too long a time.   °WHAT HAPPENS IF MY VASCULAR ACCESS MALFUNCTIONS? °Your health care provider may order blood work, cultures, or an X-ray test in order to learn what may be wrong with your vascular access. The X-ray test involves the injection of a liquid into the vascular access. The liquid shows up on the X-ray and allows your health care provider to see if there is a blockage in the vascular access.  °Treatment varies depending on the cause of the malfunction:   °· If the vascular access is infected, your health care provider  may prescribe antibiotic medicine to control the infection.   °· If a clot is found in the vascular access, you may need surgery to remove the clot.   °· If a blockage in the vascular access is due to some other cause (such as a kink in a graft), then you will likely need surgery to unblock or replace the graft.   °HOME CARE INSTRUCTIONS: °Follow up with your surgeon or other health care provider if you were instructed to do so. This is very important. Any delay in follow-up could cause permanent dysfunction of the vascular access, which may be dangerous.  °SEEK MEDICAL CARE IF:  °· Fever develops.   °· Swelling and pain around the vascular access gets worse or new pain develops. °· Pain, numbness, or an unusual pale skin color develops in the hand on the side of your vascular access. °SEEK IMMEDIATE MEDICAL CARE IF: °Unusual bleeding develops at the location of the vascular access. °MAKE SURE YOU: °· Understand these instructions. °· Will watch your condition. °· Will get help right away if you are not doing well or get worse. °Document Released: 02/21/2006 Document Revised: 08/05/2013 Document Reviewed: 08/23/2012 °ExitCare® Patient Information ©2015 [employer]. This information is not intended to replace advice given to you by your health care provider. Make sure you discuss any questions you have with your health care provider. ° °

## 2015-01-05 NOTE — ED Notes (Signed)
Patient undressed, in gown, on monitor, continuous pulse oximetry and blood pressure cuff; visitor at bedside 

## 2015-01-05 NOTE — ED Provider Notes (Signed)
CSN: [id number]     Arrival date & time 01/05/15  1528 History   First MD Initiated Contact with Patient 01/05/15 1532     Chief Complaint  Patient presents with  . Bleeding/Bruising     (Consider location/radiation/quality/duration/timing/severity/associated sxs/prior Treatment) The history is provided by the patient.   patient presents for bleeding AV fistula. States that she had a little scab on her left upper extremity fistula. C states this is no longer used for her dialysis. States that it had "blown up". And she now has a catheter on her right chest wall. States that today there was a scab where the distal had previously been using she thinks it got caught on her shirt. It was squirting blood. States she does not think she lost that much blood, however she had a blood pressure of 80 upon EMS arrival. The bleeding is now controlled under a wrap. No lightheadedness or dizziness now.  Past Medical History  Diagnosis Date  . Thyroid disease   . High cholesterol   . Hypertension     sees Dr. Dione Housekeeper  . Diabetes mellitus     "diet controlled"  . Kidney failure     sees Dr. Justin Mend  . Arthritis    Past Surgical History  Procedure Laterality Date  . Joint replacement      bilateral knee  . Joint replacement      shoulder  . Total knee arthroplasty      right knee  . Knee surgery      left  . Shoulder surgery      right shoulder  . Eye surgery      bilateral cataract removal  . Dilation and curettage of uterus    . Tubal ligation    . Av fistula placement  04/03/2012    Procedure: ARTERIOVENOUS (AV) FISTULA CREATION;  Surgeon: Elam Dutch, MD;  Location: [facility] OR;  Service: Vascular;  Laterality: Left;  creation left brachial cephalic fistula    Family History  Problem Relation Age of Onset  . Diabetes Sister   . Cancer Brother   . Hyperlipidemia Daughter   . Hypertension Daughter   . Hypertension Son    Social History  Substance Use Topics  . Smoking status:  Former Smoker    Types: Cigarettes    Quit date: 12/02/2004  . Smokeless tobacco: Never Used     Comment: "stopped 5 years ago"  . Alcohol Use: No   OB History    No data available     Review of Systems  Constitutional: Negative for appetite change.  Musculoskeletal: Negative for back pain.  Skin: Positive for wound.  Neurological: Positive for light-headedness.  Hematological: Bruises/bleeds easily.      Allergies  Sulfa drugs cross reactors; Amoxicillin; and Percocet  Home Medications   Prior to Admission medications   Medication Sig Start Date End Date Taking? Authorizing Provider  ACCU-CHEK AVIVA PLUS test strip  11/11/14   Historical Provider, MD  acetaminophen (TYLENOL) 325 MG tablet Take 650 mg by mouth every 6 (six) hours as needed for mild pain.    Historical Provider, MD  amLODipine (NORVASC) 10 MG tablet Take 1 tablet (10 mg total) by mouth daily. Patient taking differently: Take 5 mg by mouth daily. Patient taking 1/2 of the 10 mg tablet per daughter 07/30/13   Mariel Aloe, MD  aspirin EC 81 MG tablet Take 81 mg by mouth daily.      Historical Provider, MD  calcium carbonate (TUMS - DOSED IN MG ELEMENTAL CALCIUM) 500 MG chewable tablet Chew 1 tablet by mouth 3 (three) times daily with meals.    Historical Provider, MD  cephALEXin (KEFLEX) 500 MG capsule Take 500 mg by mouth 4 (four) times daily. 1-4 times daily    Historical Provider, MD  cholecalciferol (VITAMIN D) 1000 UNITS tablet Take 5,000 Units by mouth daily.    Historical Provider, MD  ciprofloxacin (CIPRO) 250 MG tablet  11/21/14   Historical Provider, MD  colchicine 0.6 MG tablet Take 0.6 mg by mouth daily.      Historical Provider, MD  darbepoetin (ARANESP) 60 MCG/0.3ML SOLN injection Inject 0.3 mLs (60 mcg total) into the skin every Monday at 6 PM. Patient not taking: Reported on 12/03/2014 08/05/13   Mariel Aloe, MD  ezetimibe (ZETIA) 10 MG tablet Take 10 mg by mouth daily.     Historical Provider, MD   furosemide (LASIX) 20 MG tablet Take 1 tablet (20 mg total) by mouth daily. Patient not taking: Reported on 12/03/2014 07/29/13   Mariel Aloe, MD  HYDROcodone-acetaminophen (NORCO/VICODIN) 5-325 MG per tablet Take 1 tablet by mouth every 6 (six) hours as needed for moderate pain. 07/30/13   Estill Dooms, MD  levothyroxine (SYNTHROID, LEVOTHROID) 100 MCG tablet Take 100 mcg by mouth daily.      Historical Provider, MD  multivitamin (RENA-VIT) TABS tablet Take 1 tablet by mouth daily.    Historical Provider, MD  sodium bicarbonate 650 MG tablet Take 2 tablets (1,300 mg total) by mouth 2 (two) times daily. Patient not taking: Reported on 12/03/2014 07/29/13   Mariel Aloe, MD  Vitamin D, Ergocalciferol, (DRISDOL) 50000 UNITS CAPS Take 50,000 Units by mouth every 30 (thirty) days.      Historical Provider, MD  ZEMPLAR 1 MCG capsule Take 1 capsule by mouth every Monday, Wednesday, and Friday.  02/27/12   Historical Provider, MD   BP 120/57 mmHg  Pulse 71  Resp 16  SpO2 98% Physical Exam  Constitutional: She appears well-developed.  Pulmonary/Chest:  Dialysis catheter right chest wall.  Musculoskeletal:  Dressing to left hand/wrist from recent carpal tunnel surgery. Dressing over proximal AV fistula left upper extremity.  Skin: Skin is warm.   dressing removed on dialysis graft. Does have good pulse in the graft. There is 1 scab that is not actively at this time.   ED Course  Procedures (including critical care time) Labs Review Labs Reviewed  CBC - Abnormal; Notable for the following:    RBC 3.08 (*)    Hemoglobin 9.3 (*)    HCT 30.0 (*)    All other components within normal limits    Imaging Review No results found. I have personally reviewed and evaluated these images and lab results as part of my medical decision-making.   EKG Interpretation None      MDM   Final diagnoses:  Dialysis AV fistula malfunction, initial encounter ([place])  Anemia, unspecified anemia type     New dressing placed and patient went on for a couple more days. She'll be dialyzed tomorrow through her chest catheter. Will need to follow-up with her doctors for her slight increase in anemia.    Davonna Belling, MD 01/05/15 [phone]

## 2015-01-28 ENCOUNTER — Encounter: Payer: Self-pay | Admitting: Vascular Surgery

## 2015-01-28 ENCOUNTER — Ambulatory Visit (HOSPITAL_COMMUNITY)
Admission: RE | Admit: 2015-01-28 | Discharge: 2015-01-28 | Disposition: A | Discharge status: Home / Self Care | Payer: Medicare Other | Source: Ambulatory Visit | Attending: Vascular Surgery | Admitting: Vascular Surgery

## 2015-01-28 ENCOUNTER — Other Ambulatory Visit: Payer: Self-pay | Admitting: Vascular Surgery

## 2015-01-28 ENCOUNTER — Ambulatory Visit (INDEPENDENT_AMBULATORY_CARE_PROVIDER_SITE_OTHER)
Admission: RE | Admit: 2015-01-28 | Discharge: 2015-01-28 | Disposition: A | Discharge status: Home / Self Care | Payer: Medicare Other | Source: Ambulatory Visit | Attending: Vascular Surgery | Admitting: Vascular Surgery

## 2015-01-28 DIAGNOSIS — Z01810 Encounter for preprocedural cardiovascular examination: Secondary | ICD-10-CM | POA: Diagnosis not present

## 2015-01-28 DIAGNOSIS — N186 End stage renal disease: Secondary | ICD-10-CM

## 2015-01-28 DIAGNOSIS — T82898A Other specified complication of vascular prosthetic devices, implants and grafts, initial encounter: Secondary | ICD-10-CM

## 2015-01-30 ENCOUNTER — Other Ambulatory Visit: Payer: Self-pay

## 2015-01-30 ENCOUNTER — Ambulatory Visit (INDEPENDENT_AMBULATORY_CARE_PROVIDER_SITE_OTHER): Payer: Medicare Other | Admitting: Vascular Surgery

## 2015-01-30 ENCOUNTER — Encounter: Payer: Self-pay | Admitting: Vascular Surgery

## 2015-01-30 ENCOUNTER — Ambulatory Visit (HOSPITAL_COMMUNITY)
Admission: RE | Admit: 2015-01-30 | Discharge: 2015-01-30 | Disposition: A | Discharge status: Home / Self Care | Payer: Medicare Other | Source: Ambulatory Visit | Attending: Vascular Surgery | Admitting: Vascular Surgery

## 2015-01-30 VITALS — BP 120/61 | HR 69 | Ht 66.0 in | Wt 179.0 lb

## 2015-01-30 DIAGNOSIS — Y832 Surgical operation with anastomosis, bypass or graft as the cause of abnormal reaction of the patient, or of later complication, without mention of misadventure at the time of the procedure: Secondary | ICD-10-CM | POA: Insufficient documentation

## 2015-01-30 DIAGNOSIS — T82898A Other specified complication of vascular prosthetic devices, implants and grafts, initial encounter: Secondary | ICD-10-CM

## 2015-01-30 DIAGNOSIS — N186 End stage renal disease: Secondary | ICD-10-CM | POA: Diagnosis not present

## 2015-01-30 DIAGNOSIS — E1122 Type 2 diabetes mellitus with diabetic chronic kidney disease: Secondary | ICD-10-CM | POA: Diagnosis not present

## 2015-01-30 DIAGNOSIS — I120 Hypertensive chronic kidney disease with stage 5 chronic kidney disease or end stage renal disease: Secondary | ICD-10-CM | POA: Insufficient documentation

## 2015-01-30 NOTE — Progress Notes (Signed)
Established Dialysis Access  History of Present Illness  [name] is a 74 y.o. (Sep 09, 1940) female who presents for re-evaluation for permanent access.  The patient is right hand dominant.  Previous access procedures have been completed in the left arm.  The patient's complication from previous access procedures include: steal sx.  The patient has never had a previous PPM placed.  The patient has a RIJV TDC due severe infilitration recently.  The patient notes steal sx of numbness and weakness in left hand that is limiting her ADL with that hand.  Pt is sent her for evaluation for placement of hybrid AVG.   Past Medical History  Diagnosis Date  . Thyroid disease   . High cholesterol   . Hypertension     sees Dr. Dione Housekeeper  . Diabetes mellitus     "diet controlled"  . Kidney failure     sees Dr. Justin Mend  . Arthritis     Past Surgical History  Procedure Laterality Date  . Joint replacement      bilateral knee  . Joint replacement      shoulder  . Total knee arthroplasty      right knee  . Knee surgery      left  . Shoulder surgery      right shoulder  . Eye surgery      bilateral cataract removal  . Dilation and curettage of uterus    . Tubal ligation    . Av fistula placement  04/03/2012    Procedure: ARTERIOVENOUS (AV) FISTULA CREATION;  Surgeon: Elam Dutch, MD;  Location: [facility] OR;  Service: Vascular;  Laterality: Left;  creation left brachial cephalic fistula     Social History   Social History  . Marital Status: Married    Spouse Name: N/A  . Number of Children: N/A  . Years of Education: N/A   Occupational History  . Not on file.   Social History Main Topics  . Smoking status: Former Smoker    Types: Cigarettes    Quit date: 12/02/2004  . Smokeless tobacco: Never Used     Comment: "stopped 5 years ago"  . Alcohol Use: No  . Drug Use: No  . Sexual Activity: Not on file   Other Topics Concern  . Not on file   Social History Narrative     Family History  Problem Relation Age of Onset  . Diabetes Sister   . Cancer Brother   . Hyperlipidemia Daughter   . Hypertension Daughter   . Hypertension Son   . Diabetes Father     before age 58    Current Outpatient Prescriptions  Medication Sig Dispense Refill  . aspirin EC 81 MG tablet Take 81 mg by mouth daily.      . colchicine 0.6 MG tablet TAKE ONE TABLET BY MOUTH EVERY DAY    . ezetimibe (ZETIA) 10 MG tablet Take 10 mg by mouth daily.     Marland Kitchen gabapentin (NEURONTIN) 100 MG capsule Take 100 mg by mouth.    . levothyroxine (SYNTHROID, LEVOTHROID) 100 MCG tablet Take 100 mcg by mouth daily.      . midodrine (PROAMATINE) 10 MG tablet Take 10 mg by mouth. Take on Tuesdays, Thursdays and Saturday before dialysis    . multivitamin (RENA-VIT) TABS tablet Take 1 tablet by mouth daily.    . sevelamer carbonate (RENVELA) 800 MG tablet Take 800 mg by mouth 2 (two) times daily.    Marland Kitchen  Vitamin D, Ergocalciferol, (DRISDOL) 50000 UNITS CAPS Take 50,000 Units by mouth every 30 (thirty) days.      Marland Kitchen ACCU-CHEK AVIVA PLUS test strip     . acetaminophen (TYLENOL) 325 MG tablet Take 650 mg by mouth every 6 (six) hours as needed for mild pain.    Marland Kitchen amLODipine (NORVASC) 10 MG tablet Take 1 tablet (10 mg total) by mouth daily. (Patient not taking: Reported on 01/30/2015) 30 tablet 0  . calcium carbonate (TUMS - DOSED IN MG ELEMENTAL CALCIUM) 500 MG chewable tablet Chew 1 tablet by mouth 3 (three) times daily with meals.    . cephALEXin (KEFLEX) 500 MG capsule Take 500 mg by mouth 4 (four) times daily. 1-4 times daily    . cholecalciferol (VITAMIN D) 1000 UNITS tablet Take 5,000 Units by mouth daily.    . ciprofloxacin (CIPRO) 250 MG tablet     . darbepoetin (ARANESP) 60 MCG/0.3ML SOLN injection Inject 0.3 mLs (60 mcg total) into the skin every Monday at 6 PM. (Patient not taking: Reported on 12/03/2014) 4.2 mL 0  . furosemide (LASIX) 20 MG tablet Take 1 tablet (20 mg total) by mouth daily. (Patient  not taking: Reported on 12/03/2014) 30 tablet 0  . HYDROcodone-acetaminophen (NORCO/VICODIN) 5-325 MG per tablet Take 1 tablet by mouth every 6 (six) hours as needed for moderate pain. (Patient not taking: Reported on 01/30/2015) 30 tablet 0  . sodium bicarbonate 650 MG tablet Take 2 tablets (1,300 mg total) by mouth 2 (two) times daily. (Patient not taking: Reported on 12/03/2014) 60 tablet 0  . ZEMPLAR 1 MCG capsule Take 1 capsule by mouth every Monday, Wednesday, and Friday.      No current facility-administered medications for this visit.     Allergies  Allergen Reactions  . Sulfa Drugs Cross Reactors Hives and Itching  . Amoxicillin Rash  . Percocet [Oxycodone-Acetaminophen] Itching and Rash     REVIEW OF SYSTEMS:  (Positives checked otherwise negative)  CARDIOVASCULAR:   [ ]  chest pain,  [ ]  chest pressure,  [ ]  palpitations,  [ ]  shortness of breath when laying flat,  [ ]  shortness of breath with exertion,   [ ]  pain in feet when walking,  [ ]  pain in feet when laying flat, [ ]  history of blood clot in veins (DVT),  [ ]  history of phlebitis,  [ ]  swelling in legs,  [ ]  varicose veins  PULMONARY:   [ ]  productive cough,  [ ]  asthma,  [ ]  wheezing  NEUROLOGIC:   [ ]  weakness in arms or legs,  [ ]  numbness in arms or legs,  [ ]  difficulty speaking or slurred speech,  [ ]  temporary loss of vision in one eye,  [ ]  dizziness  HEMATOLOGIC:   [ ]  bleeding problems,  [ ]  problems with blood clotting too easily  MUSCULOSKEL:   [ ]  joint pain, [ ]  joint swelling  GASTROINTEST:   [ ]  vomiting blood,  [ ]  blood in stool     GENITOURINARY:   [ ]  burning with urination,  [ ]  blood in urine [x]  ESRD-HD: T-R-S  PSYCHIATRIC:   [ ]  history of major depression  INTEGUMENTARY:   [ ]  rashes,  [ ]  ulcers  CONSTITUTIONAL:   [ ]  fever,  [ ]  chills     Physical Examination  Filed Vitals:   01/30/15 0932  BP: 120/61  Pulse: 69  Height: 5\' 6"  (1.676 m)   Weight: 179 lb (81.194 kg)  SpO2: 99%   Body mass index is 28.91 kg/(m^2).  General: A&O x 3, WD, elderly, ill appearing  Pulmonary: Sym exp, good air movt, CTAB, no rales, rhonchi, & wheezing  Cardiac: RRR, Nl S1, S2, no Murmurs, rubs or gallops  Vascular: Vessel Right Left  Radial Palpable Not Palpable  Ulnar Not Palpable Not Palpable  Brachial Palpable Palpable   Gastrointestinal: soft, NTND, no G/R, bo HSM, no masses, no CVAT B  Musculoskeletal: M/S 5/5 throughout , Extremities without  ischemic changes , palpable thrill in access in LUA, +bruit in access  Neurologic: Pain and light touch intact in extremities , Motor exam as listed above  Non-Invasive Vascular Imaging  LUE steal study (01/30/2015 )  R: triphasic signals, non-compressible radial and ulnar  L: non-compressive radial and ulnar, monophasic throughout, dampened digital waveforms   Outside Studies/Documentation 2 pages of outside documents were reviewed including: outside nephrology reports.   Medical Decision Making  [name] is a 74 y.o. 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  I would not place a hybrid AVG in this patient as she has bilateral forearm calcification, so there is a reasonable likelihood she is going to get steal in any right arm graft > fistula..  Patient agrees to proceed with ligation on Wed 2 NOV 16.  I would hold off on any R arm access attempt until she fully recovers use of the L hand.  She is reluctant to proceed with any thigh AVG placement, so she has elected to continue HD via her RIJ TDC at this point.   Adele Barthel, MD [facility] Office: [phone] Pager: [phone]  01/30/2015, 10:33 AM

## 2015-02-02 ENCOUNTER — Encounter (HOSPITAL_COMMUNITY): Payer: Self-pay | Admitting: *Deleted

## 2015-02-03 MED ORDER — CHLORHEXIDINE GLUCONATE CLOTH 2 % EX PADS: 6.0000 | MEDICATED_PAD | Freq: Once | CUTANEOUS | Status: DC

## 2015-02-03 MED ORDER — SODIUM CHLORIDE 0.9 % IV SOLN
INTRAVENOUS | Status: DC
  Administered 2015-02-04: 12:00:00 via INTRAVENOUS

## 2015-02-03 MED ORDER — VANCOMYCIN HCL IN DEXTROSE 1-5 GM/200ML-% IV SOLN
1000.0000 mg | INTRAVENOUS | Status: AC
  Administered 2015-02-04: 1000 mg via INTRAVENOUS
  Filled 2015-02-03: qty 200

## 2015-02-04 ENCOUNTER — Encounter (HOSPITAL_COMMUNITY): Payer: Self-pay | Admitting: *Deleted

## 2015-02-04 ENCOUNTER — Encounter (HOSPITAL_COMMUNITY)
Admission: RE | Disposition: A | Discharge status: Home / Self Care | Payer: Self-pay | Source: Ambulatory Visit | Attending: Internal Medicine

## 2015-02-04 ENCOUNTER — Ambulatory Visit (HOSPITAL_COMMUNITY): Payer: Medicare Other | Admitting: Certified Registered Nurse Anesthetist

## 2015-02-04 ENCOUNTER — Inpatient Hospital Stay (HOSPITAL_COMMUNITY)
Admission: RE | Admit: 2015-02-04 | Discharge: 2015-02-09 | DRG: 252 | Disposition: A | Discharge status: Home / Self Care | Payer: Medicare Other | Source: Ambulatory Visit | Attending: Internal Medicine | Admitting: Internal Medicine

## 2015-02-04 DIAGNOSIS — E7800 Pure hypercholesterolemia, unspecified: Secondary | ICD-10-CM | POA: Diagnosis present

## 2015-02-04 DIAGNOSIS — N186 End stage renal disease: Secondary | ICD-10-CM | POA: Diagnosis not present

## 2015-02-04 DIAGNOSIS — Z79899 Other long term (current) drug therapy: Secondary | ICD-10-CM

## 2015-02-04 DIAGNOSIS — Z882 Allergy status to sulfonamides status: Secondary | ICD-10-CM

## 2015-02-04 DIAGNOSIS — Z96611 Presence of right artificial shoulder joint: Secondary | ICD-10-CM | POA: Diagnosis present

## 2015-02-04 DIAGNOSIS — Z881 Allergy status to other antibiotic agents status: Secondary | ICD-10-CM

## 2015-02-04 DIAGNOSIS — D631 Anemia in chronic kidney disease: Secondary | ICD-10-CM | POA: Diagnosis present

## 2015-02-04 DIAGNOSIS — D649 Anemia, unspecified: Secondary | ICD-10-CM | POA: Insufficient documentation

## 2015-02-04 DIAGNOSIS — I97621 Postprocedural hematoma of a circulatory system organ or structure following other procedure: Secondary | ICD-10-CM | POA: Diagnosis not present

## 2015-02-04 DIAGNOSIS — I480 Paroxysmal atrial fibrillation: Secondary | ICD-10-CM | POA: Diagnosis present

## 2015-02-04 DIAGNOSIS — I120 Hypertensive chronic kidney disease with stage 5 chronic kidney disease or end stage renal disease: Secondary | ICD-10-CM | POA: Diagnosis present

## 2015-02-04 DIAGNOSIS — Z885 Allergy status to narcotic agent status: Secondary | ICD-10-CM | POA: Diagnosis not present

## 2015-02-04 DIAGNOSIS — I951 Orthostatic hypotension: Secondary | ICD-10-CM | POA: Diagnosis present

## 2015-02-04 DIAGNOSIS — L7632 Postprocedural hematoma of skin and subcutaneous tissue following other procedure: Secondary | ICD-10-CM | POA: Diagnosis not present

## 2015-02-04 DIAGNOSIS — N2581 Secondary hyperparathyroidism of renal origin: Secondary | ICD-10-CM | POA: Diagnosis present

## 2015-02-04 DIAGNOSIS — E1122 Type 2 diabetes mellitus with diabetic chronic kidney disease: Secondary | ICD-10-CM | POA: Diagnosis present

## 2015-02-04 DIAGNOSIS — T82898A Other specified complication of vascular prosthetic devices, implants and grafts, initial encounter: Secondary | ICD-10-CM | POA: Diagnosis present

## 2015-02-04 DIAGNOSIS — E785 Hyperlipidemia, unspecified: Secondary | ICD-10-CM | POA: Diagnosis present

## 2015-02-04 DIAGNOSIS — Z992 Dependence on renal dialysis: Secondary | ICD-10-CM | POA: Diagnosis not present

## 2015-02-04 DIAGNOSIS — Y838 Other surgical procedures as the cause of abnormal reaction of the patient, or of later complication, without mention of misadventure at the time of the procedure: Secondary | ICD-10-CM | POA: Diagnosis present

## 2015-02-04 DIAGNOSIS — Z96653 Presence of artificial knee joint, bilateral: Secondary | ICD-10-CM | POA: Diagnosis present

## 2015-02-04 DIAGNOSIS — T45515A Adverse effect of anticoagulants, initial encounter: Secondary | ICD-10-CM | POA: Diagnosis not present

## 2015-02-04 DIAGNOSIS — Z8249 Family history of ischemic heart disease and other diseases of the circulatory system: Secondary | ICD-10-CM | POA: Diagnosis not present

## 2015-02-04 DIAGNOSIS — E039 Hypothyroidism, unspecified: Secondary | ICD-10-CM | POA: Diagnosis present

## 2015-02-04 DIAGNOSIS — Z87891 Personal history of nicotine dependence: Secondary | ICD-10-CM

## 2015-02-04 DIAGNOSIS — E119 Type 2 diabetes mellitus without complications: Secondary | ICD-10-CM | POA: Diagnosis not present

## 2015-02-04 DIAGNOSIS — I4891 Unspecified atrial fibrillation: Secondary | ICD-10-CM | POA: Diagnosis not present

## 2015-02-04 DIAGNOSIS — Z7982 Long term (current) use of aspirin: Secondary | ICD-10-CM

## 2015-02-04 DIAGNOSIS — Z833 Family history of diabetes mellitus: Secondary | ICD-10-CM | POA: Diagnosis not present

## 2015-02-04 HISTORY — DX: Anemia, unspecified: D64.9

## 2015-02-04 HISTORY — PX: LIGATION OF ARTERIOVENOUS  FISTULA: SHX5948

## 2015-02-04 HISTORY — DX: Personal history of other medical treatment: Z92.89

## 2015-02-04 HISTORY — DX: Hypothyroidism, unspecified: E03.9

## 2015-02-04 LAB — CBC
HCT: 30.3 % — ABNORMAL LOW (ref 36.0–46.0)
Hemoglobin: 9.4 g/dL — ABNORMAL LOW (ref 12.0–15.0)
MCH: 30.4 pg (ref 26.0–34.0)
MCHC: 31 g/dL (ref 30.0–36.0)
MCV: 98.1 fL (ref 78.0–100.0)
Platelets: 153 10*3/uL (ref 150–400)
RBC: 3.09 MIL/uL — ABNORMAL LOW (ref 3.87–5.11)
RDW: 15.1 % (ref 11.5–15.5)
WBC: 5.4 10*3/uL (ref 4.0–10.5)

## 2015-02-04 LAB — RENAL FUNCTION PANEL
Albumin: 3.1 g/dL — ABNORMAL LOW (ref 3.5–5.0)
Anion gap: 11 (ref 5–15)
BUN: 29 mg/dL — ABNORMAL HIGH (ref 6–20)
CO2: 32 mmol/L (ref 22–32)
Calcium: 8.7 mg/dL — ABNORMAL LOW (ref 8.9–10.3)
Chloride: 98 mmol/L — ABNORMAL LOW (ref 101–111)
Creatinine, Ser: 5.01 mg/dL — ABNORMAL HIGH (ref 0.44–1.00)
GFR calc Af Amer: 9 mL/min — ABNORMAL LOW (ref >60)
GFR calc non Af Amer: 8 mL/min — ABNORMAL LOW (ref >60)
Glucose, Bld: 84 mg/dL (ref 65–99)
Phosphorus: 5.9 mg/dL — ABNORMAL HIGH (ref 2.5–4.6)
Potassium: 4.6 mmol/L (ref 3.5–5.1)
Sodium: 141 mmol/L (ref 135–145)

## 2015-02-04 LAB — TSH
TSH: 1.094 u[IU]/mL (ref 0.350–4.500)
TSH: 0.929 u[IU]/mL (ref 0.350–4.500)

## 2015-02-04 LAB — HEPARIN LEVEL (UNFRACTIONATED): Heparin Unfractionated: <0.1 IU/mL — ABNORMAL LOW (ref 0.30–0.70)

## 2015-02-04 LAB — TROPONIN I
Troponin I: 0.03 ng/mL (ref <0.031)
Troponin I: 0.03 ng/mL (ref <0.031)

## 2015-02-04 LAB — GLUCOSE, CAPILLARY
Glucose-Capillary: 66 mg/dL (ref 65–99)
Glucose-Capillary: 95 mg/dL (ref 65–99)

## 2015-02-04 LAB — POCT I-STAT 4, (NA,K, GLUC, HGB,HCT)
Glucose, Bld: 86 mg/dL (ref 65–99)
HCT: 31 % — ABNORMAL LOW (ref 36.0–46.0)
Hemoglobin: 10.5 g/dL — ABNORMAL LOW (ref 12.0–15.0)
Potassium: 4.7 mmol/L (ref 3.5–5.1)
Sodium: 138 mmol/L (ref 135–145)

## 2015-02-04 LAB — PROTIME-INR
INR: 1.04 (ref 0.00–1.49)
Prothrombin Time: 13.8 seconds (ref 11.6–15.2)

## 2015-02-04 LAB — APTT: aPTT: 41 seconds — ABNORMAL HIGH (ref 24–37)

## 2015-02-04 SURGERY — LIGATION OF ARTERIOVENOUS  FISTULA
Anesthesia: Monitor Anesthesia Care | Site: Arm Upper | Laterality: Left

## 2015-02-04 MED ORDER — SODIUM CHLORIDE 0.9 % IJ SOLN
INTRAMUSCULAR | Status: AC
  Filled 2015-02-04: qty 10

## 2015-02-04 MED ORDER — 0.9 % SODIUM CHLORIDE (POUR BTL) OPTIME
TOPICAL | Status: DC | PRN
  Administered 2015-02-04: 1000 mL

## 2015-02-04 MED ORDER — RENA-VITE PO TABS
1.0000 | ORAL_TABLET | Freq: Every day | ORAL | Status: DC
  Administered 2015-02-04 – 2015-02-09 (×6): 1 via ORAL
  Filled 2015-02-04 (×6): qty 1

## 2015-02-04 MED ORDER — DARBEPOETIN ALFA 60 MCG/0.3ML IJ SOSY
60.0000 ug | PREFILLED_SYRINGE | INTRAMUSCULAR | Status: DC
  Administered 2015-02-05: 60 ug via INTRAVENOUS
  Filled 2015-02-04: qty 0.3

## 2015-02-04 MED ORDER — SEVELAMER CARBONATE 800 MG PO TABS
1600.0000 mg | ORAL_TABLET | Freq: Three times a day (TID) | ORAL | Status: DC
  Administered 2015-02-04 – 2015-02-09 (×12): 1600 mg via ORAL
  Filled 2015-02-04 (×11): qty 2

## 2015-02-04 MED ORDER — MIDAZOLAM HCL 2 MG/2ML IJ SOLN: 0.5000 mg | Freq: Once | INTRAMUSCULAR | Status: DC | PRN

## 2015-02-04 MED ORDER — CALCIUM CHLORIDE 10 % IV SOLN
INTRAVENOUS | Status: AC
  Filled 2015-02-04: qty 10

## 2015-02-04 MED ORDER — ROCURONIUM BROMIDE 50 MG/5ML IV SOLN
INTRAVENOUS | Status: AC
  Filled 2015-02-04: qty 2

## 2015-02-04 MED ORDER — LIDOCAINE HCL (PF) 1 % IJ SOLN
INTRAMUSCULAR | Status: DC | PRN
  Administered 2015-02-04: 3 mL

## 2015-02-04 MED ORDER — EPHEDRINE SULFATE 50 MG/ML IJ SOLN
INTRAMUSCULAR | Status: AC
  Filled 2015-02-04: qty 1

## 2015-02-04 MED ORDER — MIDODRINE HCL 5 MG PO TABS
10.0000 mg | ORAL_TABLET | ORAL | Status: DC
  Administered 2015-02-05 – 2015-02-07 (×2): 10 mg via ORAL
  Filled 2015-02-04 (×2): qty 2

## 2015-02-04 MED ORDER — FENTANYL CITRATE (PF) 250 MCG/5ML IJ SOLN
INTRAMUSCULAR | Status: AC
  Filled 2015-02-04: qty 5

## 2015-02-04 MED ORDER — SODIUM CHLORIDE 0.9 % IV SOLN
125.0000 mg | INTRAVENOUS | Status: DC
  Administered 2015-02-05 – 2015-02-07 (×2): 125 mg via INTRAVENOUS
  Filled 2015-02-04 (×4): qty 10

## 2015-02-04 MED ORDER — MIDODRINE HCL 5 MG PO TABS
10.0000 mg | ORAL_TABLET | Freq: Every day | ORAL | Status: DC
  Administered 2015-02-04: 10 mg via ORAL
  Filled 2015-02-04: qty 2

## 2015-02-04 MED ORDER — GABAPENTIN 100 MG PO CAPS
100.0000 mg | ORAL_CAPSULE | Freq: Every day | ORAL | Status: DC
  Administered 2015-02-04 – 2015-02-08 (×5): 100 mg via ORAL
  Filled 2015-02-04 (×5): qty 1

## 2015-02-04 MED ORDER — WARFARIN VIDEO
Freq: Once | Status: AC
  Administered 2015-02-05: 10:00:00

## 2015-02-04 MED ORDER — LIDOCAINE HCL (PF) 1 % IJ SOLN
INTRAMUSCULAR | Status: AC
  Filled 2015-02-04: qty 30

## 2015-02-04 MED ORDER — THROMBIN 20000 UNITS EX SOLR
CUTANEOUS | Status: AC
  Filled 2015-02-04: qty 20000

## 2015-02-04 MED ORDER — VITAMIN D (ERGOCALCIFEROL) 1.25 MG (50000 UNIT) PO CAPS: 50000.0000 [IU] | ORAL_CAPSULE | ORAL | Status: DC

## 2015-02-04 MED ORDER — SODIUM CHLORIDE 0.9 % IV SOLN
250.0000 mL | INTRAVENOUS | Status: DC | PRN
  Administered 2015-02-06: 12:00:00 via INTRAVENOUS
  Administered 2015-02-06: 500 mL via INTRAVENOUS

## 2015-02-04 MED ORDER — COUMADIN BOOK
Freq: Once | Status: AC
  Administered 2015-02-04: 19:00:00
  Filled 2015-02-04: qty 1

## 2015-02-04 MED ORDER — LEVOTHYROXINE SODIUM 100 MCG PO TABS
100.0000 ug | ORAL_TABLET | Freq: Every day | ORAL | Status: DC
  Administered 2015-02-05 – 2015-02-09 (×5): 100 ug via ORAL
  Filled 2015-02-04 (×5): qty 1

## 2015-02-04 MED ORDER — PROMETHAZINE HCL 25 MG/ML IJ SOLN
6.2500 mg | INTRAMUSCULAR | Status: DC | PRN
Start: 2015-02-04 — End: 2015-02-04

## 2015-02-04 MED ORDER — SUCCINYLCHOLINE CHLORIDE 20 MG/ML IJ SOLN
INTRAMUSCULAR | Status: AC
  Filled 2015-02-04: qty 1

## 2015-02-04 MED ORDER — SODIUM CHLORIDE 0.9 % IJ SOLN
3.0000 mL | Freq: Two times a day (BID) | INTRAMUSCULAR | Status: DC
  Administered 2015-02-04 – 2015-02-08 (×4): 3 mL via INTRAVENOUS

## 2015-02-04 MED ORDER — ONDANSETRON HCL 4 MG/2ML IJ SOLN
INTRAMUSCULAR | Status: AC
  Filled 2015-02-04: qty 4

## 2015-02-04 MED ORDER — PHENYLEPHRINE HCL 10 MG/ML IJ SOLN
INTRAMUSCULAR | Status: DC | PRN
  Administered 2015-02-04: 120 ug via INTRAVENOUS
  Administered 2015-02-04: 80 ug via INTRAVENOUS

## 2015-02-04 MED ORDER — WARFARIN - PHARMACIST DOSING INPATIENT
Freq: Every day | Status: DC
  Administered 2015-02-08: 18:00:00

## 2015-02-04 MED ORDER — SODIUM CHLORIDE 0.9 % IJ SOLN: 3.0000 mL | INTRAMUSCULAR | Status: DC | PRN

## 2015-02-04 MED ORDER — ONDANSETRON HCL 4 MG/2ML IJ SOLN
4.0000 mg | Freq: Four times a day (QID) | INTRAMUSCULAR | Status: DC | PRN
  Filled 2015-02-04: qty 2

## 2015-02-04 MED ORDER — WARFARIN SODIUM 5 MG PO TABS
5.0000 mg | ORAL_TABLET | Freq: Once | ORAL | Status: AC
  Administered 2015-02-04: 5 mg via ORAL
  Filled 2015-02-04: qty 1

## 2015-02-04 MED ORDER — MEPERIDINE HCL 25 MG/ML IJ SOLN: 6.2500 mg | INTRAMUSCULAR | Status: DC | PRN

## 2015-02-04 MED ORDER — DOXERCALCIFEROL 4 MCG/2ML IV SOLN
2.0000 ug | INTRAVENOUS | Status: DC
  Administered 2015-02-05 – 2015-02-07 (×2): 2 ug via INTRAVENOUS
  Filled 2015-02-04 (×2): qty 2

## 2015-02-04 MED ORDER — PHENYLEPHRINE 40 MCG/ML (10ML) SYRINGE FOR IV PUSH (FOR BLOOD PRESSURE SUPPORT)
PREFILLED_SYRINGE | INTRAVENOUS | Status: AC
  Filled 2015-02-04: qty 10

## 2015-02-04 MED ORDER — HEPARIN (PORCINE) IN NACL 100-0.45 UNIT/ML-% IJ SOLN
1200.0000 [IU]/h | INTRAMUSCULAR | Status: AC
  Administered 2015-02-04 – 2015-02-05 (×2): 1100 [IU]/h via INTRAVENOUS
  Filled 2015-02-04 (×2): qty 250

## 2015-02-04 MED ORDER — PROPOFOL 10 MG/ML IV BOLUS
INTRAVENOUS | Status: AC
  Filled 2015-02-04: qty 20

## 2015-02-04 MED ORDER — EZETIMIBE 10 MG PO TABS
10.0000 mg | ORAL_TABLET | Freq: Every day | ORAL | Status: DC
  Administered 2015-02-05 – 2015-02-09 (×5): 10 mg via ORAL
  Filled 2015-02-04 (×5): qty 1

## 2015-02-04 MED ORDER — ACETAMINOPHEN 325 MG PO TABS
650.0000 mg | ORAL_TABLET | ORAL | Status: DC | PRN
  Administered 2015-02-04 – 2015-02-07 (×5): 650 mg via ORAL
  Filled 2015-02-04 (×6): qty 2

## 2015-02-04 MED ORDER — PROPOFOL 500 MG/50ML IV EMUL
INTRAVENOUS | Status: DC | PRN
  Administered 2015-02-04: 50 ug/kg/min via INTRAVENOUS

## 2015-02-04 MED ORDER — PROPOFOL 10 MG/ML IV BOLUS
INTRAVENOUS | Status: DC | PRN
  Administered 2015-02-04: 30 mg via INTRAVENOUS

## 2015-02-04 MED ORDER — ONDANSETRON HCL 4 MG/2ML IJ SOLN
INTRAMUSCULAR | Status: DC | PRN
  Administered 2015-02-04: 4 mg via INTRAVENOUS

## 2015-02-04 MED ORDER — FENTANYL CITRATE (PF) 100 MCG/2ML IJ SOLN: 25.0000 ug | INTRAMUSCULAR | Status: DC | PRN

## 2015-02-04 SURGICAL SUPPLY — 45 items
CANISTER SUCTION 2500CC (MISCELLANEOUS) ×2 IMPLANT
COVER PROBE W GEL 5X96 (DRAPES) ×2 IMPLANT
COVER SURGICAL LIGHT HANDLE (MISCELLANEOUS) ×1 IMPLANT
DRAPE ORTHO SPLIT 77X108 STRL (DRAPES) ×2
DRAPE PROXIMA HALF (DRAPES) ×2 IMPLANT
DRAPE SURG ORHT 6 SPLT 77X108 (DRAPES) IMPLANT
ELECT REM PT RETURN 9FT ADLT (ELECTROSURGICAL) ×2
ELECTRODE REM PT RTRN 9FT ADLT (ELECTROSURGICAL) ×1 IMPLANT
GAUZE SPONGE 4X4 12PLY STRL (GAUZE/BANDAGES/DRESSINGS) ×2 IMPLANT
GAUZE SPONGE 4X4 16PLY XRAY LF (GAUZE/BANDAGES/DRESSINGS) ×1 IMPLANT
GEL ULTRASOUND 20GR AQUASONIC (MISCELLANEOUS) IMPLANT
GLOVE BIO SURGEON STRL SZ 6.5 (GLOVE) ×2 IMPLANT
GLOVE BIO SURGEON STRL SZ7 (GLOVE) ×2 IMPLANT
GLOVE BIOGEL PI IND STRL 6.5 (GLOVE) IMPLANT
GLOVE BIOGEL PI IND STRL 7.0 (GLOVE) IMPLANT
GLOVE BIOGEL PI IND STRL 7.5 (GLOVE) ×1 IMPLANT
GLOVE BIOGEL PI INDICATOR 6.5 (GLOVE) ×2
GLOVE BIOGEL PI INDICATOR 7.0 (GLOVE) ×1
GLOVE BIOGEL PI INDICATOR 7.5 (GLOVE) ×1
GLOVE ECLIPSE 7.0 STRL STRAW (GLOVE) ×1 IMPLANT
GOWN STRL REUS W/ TWL LRG LVL3 (GOWN DISPOSABLE) ×3 IMPLANT
GOWN STRL REUS W/TWL LRG LVL3 (GOWN DISPOSABLE) ×6
KIT BASIN OR (CUSTOM PROCEDURE TRAY) ×2 IMPLANT
KIT ROOM TURNOVER OR (KITS) ×2 IMPLANT
LIQUID BAND (GAUZE/BANDAGES/DRESSINGS) ×2 IMPLANT
NDL HYPO 25GX1X1/2 BEV (NEEDLE) IMPLANT
NEEDLE HYPO 25GX1X1/2 BEV (NEEDLE) ×2 IMPLANT
NS IRRIG 1000ML POUR BTL (IV SOLUTION) ×2 IMPLANT
PACK CV ACCESS (CUSTOM PROCEDURE TRAY) ×2 IMPLANT
PACK GENERAL/GYN (CUSTOM PROCEDURE TRAY) ×1 IMPLANT
PAD ARMBOARD 7.5X6 YLW CONV (MISCELLANEOUS) ×4 IMPLANT
SPONGE SURGIFOAM ABS GEL 100 (HEMOSTASIS) IMPLANT
STOCKINETTE IMPERVIOUS 9X36 MD (GAUZE/BANDAGES/DRESSINGS) ×1 IMPLANT
SUT ETHILON 3 0 PS 1 (SUTURE) IMPLANT
SUT MNCRL AB 4-0 PS2 18 (SUTURE) ×4 IMPLANT
SUT PROLENE 6 0 BV (SUTURE) IMPLANT
SUT SILK 0 TIES 10X30 (SUTURE) ×2 IMPLANT
SUT SILK 2 0 SH (SUTURE) ×1 IMPLANT
SUT VIC AB 3-0 SH 27 (SUTURE) ×2
SUT VIC AB 3-0 SH 27X BRD (SUTURE) ×1 IMPLANT
SWAB COLLECTION DEVICE MRSA (MISCELLANEOUS) IMPLANT
SYR CONTROL 10ML LL (SYRINGE) ×1 IMPLANT
TUBE ANAEROBIC SPECIMEN COL (MISCELLANEOUS) IMPLANT
UNDERPAD 30X30 INCONTINENT (UNDERPADS AND DIAPERS) ×2 IMPLANT
WATER STERILE IRR 1000ML POUR (IV SOLUTION) ×2 IMPLANT

## 2015-02-04 NOTE — Progress Notes (Signed)
Going over patient MAR, patient states only takes midodrine on days of dialysis. In MAR midodrine ordered daily. Patient received a dose on admission to unit. MD made aware. Pharmacy notified to change order. Will continue to monitor. Pt BP 120/50.

## 2015-02-04 NOTE — Progress Notes (Signed)
   Daily Progress Note  Pt presented with new onset afib in the preop holding area.  Given the severity of this patient's steal syndrome in her left arm, I felt the ligation of L BC AVF needed to continue.  The procedure was completed in 10-15 minutes.  I have contact [facility] to admit the patient.  The patient turned out to be a [facility] patient, so they were contacted by [facility].  [facility] was also contacted for their opinion and assistance with management.     Adele Barthel, MD [facility] [place] Office: [phone] Pager: [phone]  02/04/2015, 3:26 PM

## 2015-02-04 NOTE — Interval H&P Note (Signed)
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 After consideration of risks, benefits and other options for treatment, the patient has consented to  Procedure(s): LIGATION OF BRACHIOCEPHALIC ARTERIOVENOUS  FISTULA (Left) as a surgical intervention .  The patient's history has been reviewed, patient examined, no change in status, stable for surgery.  I have reviewed the patient's chart and labs.  Questions were answered to the patient's satisfaction.     Adele Barthel

## 2015-02-04 NOTE — H&P (View-Only) (Signed)
Established Dialysis Access  History of Present Illness  [name] is a 74 y.o. (Feb 06, 1941) female who presents for re-evaluation for permanent access.  The patient is right hand dominant.  Previous access procedures have been completed in the left arm.  The patient's complication from previous access procedures include: steal sx.  The patient has never had a previous PPM placed.  The patient has a RIJV TDC due severe infilitration recently.  The patient notes steal sx of numbness and weakness in left hand that is limiting her ADL with that hand.  Pt is sent her for evaluation for placement of hybrid AVG.   Past Medical History  Diagnosis Date  . Thyroid disease   . High cholesterol   . Hypertension     sees Dr. Dione Housekeeper  . Diabetes mellitus     "diet controlled"  . Kidney failure     sees Dr. Justin Mend  . Arthritis     Past Surgical History  Procedure Laterality Date  . Joint replacement      bilateral knee  . Joint replacement      shoulder  . Total knee arthroplasty      right knee  . Knee surgery      left  . Shoulder surgery      right shoulder  . Eye surgery      bilateral cataract removal  . Dilation and curettage of uterus    . Tubal ligation    . Av fistula placement  04/03/2012    Procedure: ARTERIOVENOUS (AV) FISTULA CREATION;  Surgeon: Elam Dutch, MD;  Location: [facility] OR;  Service: Vascular;  Laterality: Left;  creation left brachial cephalic fistula     Social History   Social History  . Marital Status: Married    Spouse Name: N/A  . Number of Children: N/A  . Years of Education: N/A   Occupational History  . Not on file.   Social History Main Topics  . Smoking status: Former Smoker    Types: Cigarettes    Quit date: 12/02/2004  . Smokeless tobacco: Never Used     Comment: "stopped 5 years ago"  . Alcohol Use: No  . Drug Use: No  . Sexual Activity: Not on file   Other Topics Concern  . Not on file   Social History Narrative     Family History  Problem Relation Age of Onset  . Diabetes Sister   . Cancer Brother   . Hyperlipidemia Daughter   . Hypertension Daughter   . Hypertension Son   . Diabetes Father     before age 51    Current Outpatient Prescriptions  Medication Sig Dispense Refill  . aspirin EC 81 MG tablet Take 81 mg by mouth daily.      . colchicine 0.6 MG tablet TAKE ONE TABLET BY MOUTH EVERY DAY    . ezetimibe (ZETIA) 10 MG tablet Take 10 mg by mouth daily.     Marland Kitchen gabapentin (NEURONTIN) 100 MG capsule Take 100 mg by mouth.    . levothyroxine (SYNTHROID, LEVOTHROID) 100 MCG tablet Take 100 mcg by mouth daily.      . midodrine (PROAMATINE) 10 MG tablet Take 10 mg by mouth. Take on Tuesdays, Thursdays and Saturday before dialysis    . multivitamin (RENA-VIT) TABS tablet Take 1 tablet by mouth daily.    . sevelamer carbonate (RENVELA) 800 MG tablet Take 800 mg by mouth 2 (two) times daily.    Marland Kitchen  Vitamin D, Ergocalciferol, (DRISDOL) 50000 UNITS CAPS Take 50,000 Units by mouth every 30 (thirty) days.      Marland Kitchen ACCU-CHEK AVIVA PLUS test strip     . acetaminophen (TYLENOL) 325 MG tablet Take 650 mg by mouth every 6 (six) hours as needed for mild pain.    Marland Kitchen amLODipine (NORVASC) 10 MG tablet Take 1 tablet (10 mg total) by mouth daily. (Patient not taking: Reported on 01/30/2015) 30 tablet 0  . calcium carbonate (TUMS - DOSED IN MG ELEMENTAL CALCIUM) 500 MG chewable tablet Chew 1 tablet by mouth 3 (three) times daily with meals.    . cephALEXin (KEFLEX) 500 MG capsule Take 500 mg by mouth 4 (four) times daily. 1-4 times daily    . cholecalciferol (VITAMIN D) 1000 UNITS tablet Take 5,000 Units by mouth daily.    . ciprofloxacin (CIPRO) 250 MG tablet     . darbepoetin (ARANESP) 60 MCG/0.3ML SOLN injection Inject 0.3 mLs (60 mcg total) into the skin every Monday at 6 PM. (Patient not taking: Reported on 12/03/2014) 4.2 mL 0  . furosemide (LASIX) 20 MG tablet Take 1 tablet (20 mg total) by mouth daily. (Patient  not taking: Reported on 12/03/2014) 30 tablet 0  . HYDROcodone-acetaminophen (NORCO/VICODIN) 5-325 MG per tablet Take 1 tablet by mouth every 6 (six) hours as needed for moderate pain. (Patient not taking: Reported on 01/30/2015) 30 tablet 0  . sodium bicarbonate 650 MG tablet Take 2 tablets (1,300 mg total) by mouth 2 (two) times daily. (Patient not taking: Reported on 12/03/2014) 60 tablet 0  . ZEMPLAR 1 MCG capsule Take 1 capsule by mouth every Monday, Wednesday, and Friday.      No current facility-administered medications for this visit.     Allergies  Allergen Reactions  . Sulfa Drugs Cross Reactors Hives and Itching  . Amoxicillin Rash  . Percocet [Oxycodone-Acetaminophen] Itching and Rash     REVIEW OF SYSTEMS:  (Positives checked otherwise negative)  CARDIOVASCULAR:   [ ]  chest pain,  [ ]  chest pressure,  [ ]  palpitations,  [ ]  shortness of breath when laying flat,  [ ]  shortness of breath with exertion,   [ ]  pain in feet when walking,  [ ]  pain in feet when laying flat, [ ]  history of blood clot in veins (DVT),  [ ]  history of phlebitis,  [ ]  swelling in legs,  [ ]  varicose veins  PULMONARY:   [ ]  productive cough,  [ ]  asthma,  [ ]  wheezing  NEUROLOGIC:   [ ]  weakness in arms or legs,  [ ]  numbness in arms or legs,  [ ]  difficulty speaking or slurred speech,  [ ]  temporary loss of vision in one eye,  [ ]  dizziness  HEMATOLOGIC:   [ ]  bleeding problems,  [ ]  problems with blood clotting too easily  MUSCULOSKEL:   [ ]  joint pain, [ ]  joint swelling  GASTROINTEST:   [ ]  vomiting blood,  [ ]  blood in stool     GENITOURINARY:   [ ]  burning with urination,  [ ]  blood in urine [x]  ESRD-HD: T-R-S  PSYCHIATRIC:   [ ]  history of major depression  INTEGUMENTARY:   [ ]  rashes,  [ ]  ulcers  CONSTITUTIONAL:   [ ]  fever,  [ ]  chills     Physical Examination  Filed Vitals:   01/30/15 0932  BP: 120/61  Pulse: 69  Height: 5\' 6"  (1.676 m)   Weight: 179 lb (81.194 kg)  SpO2: 99%   Body mass index is 28.91 kg/(m^2).  General: A&O x 3, WD, elderly, ill appearing  Pulmonary: Sym exp, good air movt, CTAB, no rales, rhonchi, & wheezing  Cardiac: RRR, Nl S1, S2, no Murmurs, rubs or gallops  Vascular: Vessel Right Left  Radial Palpable Not Palpable  Ulnar Not Palpable Not Palpable  Brachial Palpable Palpable   Gastrointestinal: soft, NTND, no G/R, bo HSM, no masses, no CVAT B  Musculoskeletal: M/S 5/5 throughout , Extremities without  ischemic changes , palpable thrill in access in LUA, +bruit in access  Neurologic: Pain and light touch intact in extremities , Motor exam as listed above  Non-Invasive Vascular Imaging  LUE steal study (01/30/2015 )  R: triphasic signals, non-compressible radial and ulnar  L: non-compressive radial and ulnar, monophasic throughout, dampened digital waveforms   Outside Studies/Documentation 2 pages of outside documents were reviewed including: outside nephrology reports.   Medical Decision Making  ARLI PENINGTON is a 74 y.o. 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  I would not place a hybrid AVG in this patient as she has bilateral forearm calcification, so there is a reasonable likelihood she is going to get steal in any right arm graft > fistula..  Patient agrees to proceed with ligation on Wed 2 NOV 16.  I would hold off on any R arm access attempt until she fully recovers use of the L hand.  She is reluctant to proceed with any thigh AVG placement, so she has elected to continue HD via her RIJ TDC at this point.   Adele Barthel, MD [facility] [place] Office: [phone] Pager: [phone]  01/30/2015, 10:33 AM

## 2015-02-04 NOTE — H&P (Signed)
[facility] History and Physical                                                                                    [name], is a 74 y.o. female  MRN: [id number]   DOB - 10-Mar-1941  Admit Date - 02/04/2015  Outpatient Primary MD for the patient is Sherrie Mustache, MD  Referring Physician:  Dr. Bridgett Larsson  Chief Complaint:  A fib.   HPI  [name]  is a 74 y.o. female, with end-stage renal disease, hypothyroidism, and diet-controlled diabetes. She presented to [facility] today for an elective procedure to remove a left arm fistula due to subclavian steal syndrome. Per Dr. Bridgett Larsson and EKG was obtained prior to the procedure that showed atrial fibrillation that was felt to be new onset. The patient reports that she has been feeling in her usual state of health recently. She started hemodialysis in August 2016. At that time her amlodipine had to be discontinued due to low blood pressure, and she was placed on midodrine. This and Renvela are the only new medication she started recently. She denies feeling any palpitations or irregular heartbeat. She denies chest pain, exertional dyspnea, orthopnea, PND. She regularly takes her thyroid supplementation.  She normally dialyzes on Tuesday/Thursday/Saturday [facility] Her nephrologist is Dr. Justin Mend.  EKG: Atrial fibrillation, prolonged QT, rate of 93 bpm    Review of Systems  Constitutional: Negative for fever, chills and weight loss.  HENT: Negative.   Eyes: Negative.   Respiratory: Negative.   Cardiovascular: Negative for chest pain, palpitations, orthopnea and PND.  Gastrointestinal: Negative.   Genitourinary: Negative.   Musculoskeletal: Negative.   Skin: Negative.   Neurological: Negative.   Endo/Heme/Allergies: Negative.   Psychiatric/Behavioral: Negative.      Past Medical History  Past Medical History  Diagnosis Date  . Thyroid disease   . High cholesterol   . Hypertension     sees Dr. Dione Housekeeper  .  Diabetes mellitus     "diet controlled"  . Arthritis   . Kidney failure     Hemodialysis TThu Sat  . Hypothyroidism   . Anemia   . History of blood transfusion     Past Surgical History  Procedure Laterality Date  . Joint replacement Bilateral     bilateral knee  . Joint replacement Right     shoulder  . Total knee arthroplasty      right knee  . Eye surgery Bilateral     bilateral cataract removal  . Dilation and curettage of uterus    . Tubal ligation    . Av fistula placement  04/03/2012    Procedure: ARTERIOVENOUS (AV) FISTULA CREATION;  Surgeon: Elam Dutch, MD;  Location: [facility] OR;  Service: Vascular;  Laterality: Left;  creation left brachial cephalic fistula       Social History Social History  Substance Use Topics  . Smoking status: Former Smoker    Types: Cigarettes    Quit date: 12/02/2004  . Smokeless tobacco: Never Used     Comment: "stopped 5 years ago"  . Alcohol Use: No  She lives with her son and daughter-in-law.  Walks with a walker occasionally uses a wheelchair.  Family History Family History  Problem Relation Age of Onset  . Diabetes Sister   . Cancer Brother   . Hyperlipidemia Daughter   . Hypertension Daughter   . Hypertension Son   . Diabetes Father     before age 74   father died of an MI at age 3, mother lived to be 62.   Prior to Admission medications   Medication Sig Start Date End Date Taking? Authorizing Provider  acetaminophen (TYLENOL) 325 MG tablet Take 650 mg by mouth every 6 (six) hours as needed for mild pain.   Yes Historical Provider, MD  aspirin EC 81 MG tablet Take 81 mg by mouth daily.     Yes Historical Provider, MD  colchicine 0.6 MG tablet TAKE ONE TABLET BY MOUTH EVERY DAY 09/25/14  Yes Historical Provider, MD  ezetimibe (ZETIA) 10 MG tablet Take 10 mg by mouth daily.    Yes Historical Provider, MD  gabapentin (NEURONTIN) 100 MG capsule Take 100 mg by mouth at bedtime.  12/22/14 12/22/15 Yes Historical Provider, MD   levothyroxine (SYNTHROID, LEVOTHROID) 100 MCG tablet Take 100 mcg by mouth daily.     Yes Historical Provider, MD  midodrine (PROAMATINE) 10 MG tablet Take 10 mg by mouth. Take on Tuesdays, Thursdays and Saturday before dialysis   Yes Historical Provider, MD  multivitamin (RENA-VIT) TABS tablet Take 1 tablet by mouth daily.   Yes Historical Provider, MD  sevelamer carbonate (RENVELA) 800 MG tablet Take 1,600 mg by mouth 3 (three) times daily with meals.    Yes Historical Provider, MD  Vitamin D, Ergocalciferol, (DRISDOL) 50000 UNITS CAPS Take 50,000 Units by mouth every 30 (thirty) days.     Yes Historical Provider, MD  ZEMPLAR 1 MCG capsule Take 1 capsule by mouth every Monday, Wednesday, and Friday.  02/27/12  Yes Historical Provider, MD  [place] test strip  11/11/14   Historical Provider, MD    Allergies  Allergen Reactions  . Sulfa Drugs Cross Reactors Hives and Itching  . Amoxicillin Rash  . Percocet [Oxycodone-Acetaminophen] Itching and Rash    Did not happen last time she took it    Physical Exam  Vitals  Blood pressure 100/79, pulse 69, temperature 98 F (36.7 C), temperature source Oral, resp. rate 21, height 5\' 6"  (1.676 m), weight 81.194 kg (179 lb), SpO2 98 %.   General:  Elderly thin female, lying in bed in NAD  Psych:  Normal affect and insight, Not Suicidal or Homicidal, Awake Alert, Oriented X 3.  Neuro:   No F.N deficits, ALL C.Nerves Intact, Strength 5/5 all 4 extremities, Sensation intact all 4 extremities.  ENT:  Ears and Eyes appear Normal, Conjunctivae clear, PER. Moist oral mucosa without erythema or exudates.  Neck:  Supple, No lymphadenopathy appreciated  Respiratory:  Symmetrical chest wall movement, Good air movement bilaterally, CTAB.  IJ in right chest.  Cardiac:  Irregular, [id number] systolic murmur, no LE edema noted, no JVD.    Abdomen:  Positive bowel sounds, Soft, Non tender, Non distended,  No masses appreciated  Skin:  No Cyanosis,  Normal Skin Turgor, No Skin Rash or Bruise.  Extremities:  Able to move all 4. 5/5 strength in each,  no effusions.  Decreased rom of left hand and fingers.  Data Review  Wt Readings from Last 3 Encounters:  02/04/15 81.194 kg (179 lb)  01/30/15 81.194 kg (179 lb)  12/03/14 81.194 kg (179 lb)  CBC  Recent Labs Lab 02/04/15 1224 02/04/15 1630  WBC  --  5.4  HGB 10.5* 9.4*  HCT 31.0* 30.3*  PLT  --  153  MCV  --  98.1  MCH  --  30.4  MCHC  --  31.0  RDW  --  15.1    Chemistries   Recent Labs Lab 02/04/15 1224  NA 138  K 4.7  GLUCOSE 86     Lab Results  Component Value Date   HGBA1C 5.2 07/26/2013    Urinalysis:  Pending  Imaging results:   No results found.  My personal review of EKG: Atrial fibrillation, prolonged QT, rate of 93 bpm   Assessment & Plan  Principal Problem:   New onset a-fib (HCC) Active Problems:   End stage renal disease (HCC)   Steal syndrome dialysis vascular access (HCC)   Diabetes mellitus type 2, diet-controlled ([place])   Hypothyroidism   High cholesterol   New onset A-fib. On repeat questioning patient mentions to my attending that she has had this before (possible Paroxysmal afib?) Only new meds are midodrine and renvela.  No obvious cause for afib. Will cycle troponin, check TSH, 2D echo.  Cardiology consulted. CHADSVASC is approx 4.  Will place on coumadin and lovenox per pharmacy.  Will defer to cardiology about weather or not a direct oral anticoagulant can be used.   Left hand subclavian steal  Left arm fistula removed by Dr. Bridgett Larsson today.     ESRD. Access via IJ in right chest.  Scheduled HD on T/T/Sat.  Have consulted Nephrology for HD.   Hypothyroidism. Check TSH.  Continue Synthroid.   Diabetes Diet controlled.  Last Hgb A1C was 5.2   Recent UTI Patient still makes some urine.  Reports she finished antibiotics 1 week ago for UTI.  Will check a U/A.   Normocytic Anemia Stable for management  outpatient.    Consultants Called:    Cardiology, [place], Vascular, Dr. Bridgett Larsson.  Family Communication:     Patient is alert, orientated and understands their plan of care.  Code Status:    Full code, but patient does not want long term life support.  Condition:    Stable.  Potential Disposition:   To home in 24-48 hours when ok with cards and vascular.  Time spent in minutes : [place],  [place] on 02/04/2015 at 4:55 PM Between 7am to 7pm - Pager - [phone] After 7pm go to [email or web address] - password [email or web address] And look for the night coverage person covering me after hours

## 2015-02-04 NOTE — Progress Notes (Signed)
EKG shows A fib, Dr Glennon Mac informed.

## 2015-02-04 NOTE — Anesthesia Preprocedure Evaluation (Addendum)
Anesthesia Evaluation  Patient identified by MRN, date of birth, ID band Patient awake    Reviewed: Allergy & Precautions, NPO status , Patient's Chart, lab work & pertinent test results  History of Anesthesia Complications Negative for: history of anesthetic complications  Airway Mallampati: II  TM Distance: >3 FB Neck ROM: Full    Dental  (+) Edentulous Upper, Edentulous Lower   Pulmonary former smoker (quit 2006),    breath sounds clear to auscultation       Cardiovascular hypertension, (-) angina Rhythm:Irregular Rate:Normal     Neuro/Psych negative neurological ROS     GI/Hepatic Neg liver ROS,   Endo/Other  diabetes (diet controlled, glu 86)Hypothyroidism   Renal/GU ESRF and DialysisRenal disease (dialyzed yesterday, K+ 4.7)     Musculoskeletal   Abdominal   Peds  Hematology   Anesthesia Other Findings   Reproductive/Obstetrics                           Anesthesia Physical Anesthesia Plan  ASA: III  Anesthesia Plan: MAC   Post-op Pain Management:    Induction: Intravenous  Airway Management Planned: Natural Airway and Simple Face Mask  Additional Equipment:   Intra-op Plan:   Post-operative Plan:   Informed Consent: I have reviewed the patients History and Physical, chart, labs and discussed the procedure including the risks, benefits and alternatives for the proposed anesthesia with the patient or authorized representative who has indicated his/her understanding and acceptance.     Plan Discussed with: CRNA and Surgeon  Anesthesia Plan Comments: (Plan routine monitors, MAC New onset Afib: will have cardiology eval )        Anesthesia Quick Evaluation

## 2015-02-04 NOTE — Consult Note (Signed)
Admit date: 02/04/2015 Referring Physician  Dr. Marily Memos Primary Physician  Dr. Edrick Oh Primary Cardiologist  None Reason for Consultation  New onset atrial fibrillation  HPI: [name] is a 75 y.o. female with ESRD secondary to DM on TTS dialysis at the [facility] since August who has had multiple AVF difficulties and subsequently had ligation of AVF today due to steal. She also has HTN, dyslipidemia and hypothyroidism.  She had new onset afib in the pre op area today. Due to steal symptoms and brief nature of the procedure, her AVF was ligated and she was admitted for further treatment of her afib. She previously had been on norvasc but it was stopped due to low BP and midodrine added for BP support pre HD. Her heart rate has been fairly consistent pre and post HD in the 60s. Her average UF volumes are 1.7 - 3 and last got to EDW 10/29 after a UF of 80.5 I stat K today was 4.7. She is due for dialysis tomorrow. She denies having an irregular heart rhythm before or heart racing. She has had no CP, SOB, chest pressure. Her hand is still numb but feels better post ligation.  She is unaware of ever being told that she has had atrial fibrillation in the past.  Post op her heart rate is in the 80's in  Atrial fibrillation.  EKG shows no ST changes.    PMH:   Past Medical History  Diagnosis Date  . Thyroid disease   . High cholesterol   . Hypertension     sees Dr. Dione Housekeeper  . Diabetes mellitus     "diet controlled"  . Arthritis   . Kidney failure     Hemodialysis TThu Sat  . Hypothyroidism   . Anemia   . History of blood transfusion      PSH:   Past Surgical History  Procedure Laterality Date  . Joint replacement Bilateral     bilateral knee  . Joint replacement Right     shoulder  . Total knee arthroplasty      right knee  . Eye surgery Bilateral     bilateral cataract removal  . Dilation and curettage of uterus    . Tubal ligation    . Av fistula placement  04/03/2012      Procedure: ARTERIOVENOUS (AV) FISTULA CREATION;  Surgeon: Elam Dutch, MD;  Location: [facility] OR;  Service: Vascular;  Laterality: Left;  creation left brachial cephalic fistula     Allergies:  Sulfa drugs cross reactors; Amoxicillin; and Percocet Prior to Admit Meds:   Prescriptions prior to admission  Medication Sig Dispense Refill Last Dose  . acetaminophen (TYLENOL) 325 MG tablet Take 650 mg by mouth every 6 (six) hours as needed for mild pain.   02/03/2015 at Unknown time  . aspirin EC 81 MG tablet Take 81 mg by mouth daily.     02/04/2015 at 0830  . colchicine 0.6 MG tablet TAKE ONE TABLET BY MOUTH EVERY DAY   02/03/2015 at Unknown time  . ezetimibe (ZETIA) 10 MG tablet Take 10 mg by mouth daily.    02/03/2015 at Unknown time  . gabapentin (NEURONTIN) 100 MG capsule Take 100 mg by mouth at bedtime.    02/03/2015 at Unknown time  . levothyroxine (SYNTHROID, LEVOTHROID) 100 MCG tablet Take 100 mcg by mouth daily.     02/04/2015 at 0830  . midodrine (PROAMATINE) 10 MG tablet Take 10 mg by mouth. Take  on Tuesdays, Thursdays and Saturday before dialysis   02/03/2015  . multivitamin (RENA-VIT) TABS tablet Take 1 tablet by mouth daily.   02/03/2015 at Unknown time  . sevelamer carbonate (RENVELA) 800 MG tablet Take 1,600 mg by mouth 3 (three) times daily with meals.    02/03/2015 at Unknown time  . Vitamin D, Ergocalciferol, (DRISDOL) 50000 UNITS CAPS Take 50,000 Units by mouth every 30 (thirty) days.     02/02/2015 at Unknown time  . ZEMPLAR 1 MCG capsule Take 1 capsule by mouth every Monday, Wednesday, and Friday.    Not Taking  . ACCU-CHEK AVIVA PLUS test strip    Not Taking   Fam HX:    Family History  Problem Relation Age of Onset  . Diabetes Sister   . Cancer Brother   . Hyperlipidemia Daughter   . Hypertension Daughter   . Hypertension Son   . Diabetes Father     before age 80   Social HX:    Social History   Social History  . Marital Status: Married    Spouse Name: N/A  . Number  of Children: N/A  . Years of Education: N/A   Occupational History  . Not on file.   Social History Main Topics  . Smoking status: Former Smoker    Types: Cigarettes    Quit date: 12/02/2004  . Smokeless tobacco: Never Used     Comment: "stopped 5 years ago"  . Alcohol Use: No  . Drug Use: No  . Sexual Activity: Not on file   Other Topics Concern  . Not on file   Social History Narrative     ROS:  All 11 ROS were addressed and are negative except what is stated in the HPI  Physical Exam: Blood pressure 100/79, pulse 69, temperature 98 F (36.7 C), temperature source Oral, resp. rate 21, height 5\' 6"  (1.676 m), weight 179 lb (81.194 kg), SpO2 98 %.    General: Well developed, well nourished, in no acute distress Head: Eyes PERRLA, No xanthomas.   Normal cephalic and atramatic  Lungs:   Clear bilaterally to auscultation and percussion. Heart:   Irregularly irregular S1 S2 Pulses are 2+ & equal. Abdomen: Bowel sounds are positive, abdomen soft and non-tender without masses Extremities:   No clubbing, cyanosis or edema.  DP +1 Neuro: Alert and oriented X 3. Psych:  Good affect, responds appropriately    Labs:   Lab Results  Component Value Date   WBC 5.4 02/04/2015   HGB 9.4* 02/04/2015   HCT 30.3* 02/04/2015   MCV 98.1 02/04/2015   PLT 153 02/04/2015    Recent Labs Lab 02/04/15 1224  NA 138  K 4.7  GLUCOSE 86   No results found for: PTT Lab Results  Component Value Date   INR 1.03 04/21/2010   INR 1.07 03/02/2010   INR 0.9 04/08/2008   No results found for: CKTOTAL, CKMB, CKMBINDEX, TROPONINI  No results found for: CHOL No results found for: HDL No results found for: LDLCALC No results found for: TRIG No results found for: CHOLHDL No results found for: LDLDIRECT    Radiology:  No results found.  EKG:  Atrial fibrillation at 80bpm with no ST changes  ASSESSMENT/PLAN: 1.  New onset atrial fibrillation with CVR of unknown duration.  She goes to HD  3 times weekly but there has not been any documentation of atrial fibrillation.  I will check with her HD unit to see if there patients are  on telemetry during HD.  She is rate controlled so no need for BB or CCB especially since she has had problems with soft BP at HD in the past.  Check TSH.  Check 2D echo to assess LVF and LA size.  Start on Heparin IV gtt per pharmacy and coumadin.  This patients CHA2DS2-VASc Score and unadjusted Ischemic Stroke Rate (% per year) is equal to 4 % stroke rate/year from a score of 4. 2.  ESRD on HD s/p ligation of left brachiocephalic AVF due to severe steal syndrome in her left hand 3.  HTN - controlled 4.  DM 5.  Hypothyroidism - check TSH 6.  Orthostatic hypotension on midodrine with HD  Sueanne Margarita, MD  02/04/2015  5:08 PM

## 2015-02-04 NOTE — Anesthesia Postprocedure Evaluation (Signed)
  Anesthesia Post-op Note  Patient: [name]  Procedure(s) Performed: Procedure(s): LIGATION OF BRACHIOCEPHALIC ARTERIOVENOUS  FISTULA (Left)  Patient Location: PACU  Anesthesia Type:MAC  Level of Consciousness: awake, alert , oriented and patient cooperative  Airway and Oxygen Therapy: Patient Spontanous Breathing  Post-op Pain: none  Post-op Assessment: Post-op Vital signs reviewed, Patient's Cardiovascular Status Stable, Respiratory Function Stable, Patent Airway, No signs of Nausea or vomiting and Pain level controlled              Post-op Vital Signs: Reviewed and stable  Last Vitals:  Filed Vitals:   02/04/15 1715  BP: 123/63  Pulse: 79  Temp:   Resp: 20    Complications: No apparent anesthesia complications, appreciate cardiology input on pre-op new onset afib

## 2015-02-04 NOTE — Op Note (Signed)
    OPERATIVE NOTE   PROCEDURE: 1. Ligation of left brachiocephalic arteriovenous fistula    PRE-OPERATIVE DIAGNOSIS: Severe steal syndrome left hand  POST-OPERATIVE DIAGNOSIS: same as above   SURGEON: Adele Barthel, MD  ANESTHESIA: local and MAC  ESTIMATED BLOOD LOSS: 50 cc  FINDING(S): 1.  Large brachiocephalic arteriovenous fistula  >8 mm distally 2.  Palpable left radial pulse at end of case  SPECIMEN(S):  none  INDICATIONS:   [name] is a 73 y.o. female who presents with severe left hand steal symptoms.  Left arm doppler studies were consistent with severe steal syndrome.  I recommended ligation of the left brachiocephalic arteriovenous fistula given she was already using a right internal jugular vein tunneled dialysis catheter.  Risk include but are not limited to: bleeding, infection, and need for additional procedures.  DESCRIPTION: After obtaining full informed written consent, the patient was brought back to the operating room and placed supine upon the operating table.  The patient received IV antibiotics prior to induction.  After obtaining adequate anesthesia, the patient was prepped and draped in the standard fashion for: left arm access procedure.  I injected 5 cc of 1% lidocaine without epinephrine over the anastomosis.  I dissected out the distal fistula adjacent to the anastomosis with electrocautery.  I tied the fistula with 0 silk ties and then transected the fistula.  I then reinforced the ties with 3-0 suture ligatures on both ends.  There was no further bleeding from the fistula.  Distally, there was return of a radial pulse.  I washed out the surgical incision.  There was no further active bleeding.  I reapproximated the subcutaneous tissue with a running stitch of 3-0 Vicryl.  The skin was reapproximated with a 4-0 Monocryl subcuticular stitch.  The skin was cleaned, dried, and reinforced with Dermabond.     COMPLICATIONS: none  CONDITION: stable   Adele Barthel, MD [facility] Office: [phone] Pager: [phone]  02/04/2015, 2:27 PM

## 2015-02-04 NOTE — Progress Notes (Deleted)
ANTICOAGULATION CONSULT NOTE - Initial Consult  Pharmacy Consult for Heparin and Warfarin Indication: new onset atrial fibrillation  Allergies  Allergen Reactions  . Sulfa Drugs Cross Reactors Hives and Itching  . Amoxicillin Rash  . Percocet [Oxycodone-Acetaminophen] Itching and Rash    Did not happen last time she took it    Patient Measurements: Height: 5\' 6"  (167.6 cm) (taken on 02/04/15) Weight: 179 lb (81.194 kg) (from 02/04/15) IBW/kg (Calculated) : 59.3 Heparin Dosing Weight: 76.2 kg  Vital Signs: Temp: 98 F (36.7 C) (11/02 1600) Temp Source: Oral (11/02 1147) BP: 123/63 mmHg (11/02 1715) Pulse Rate: 79 (11/02 1715)  Labs:  Recent Labs  02/04/15 1224 02/04/15 1630  HGB 10.5* 9.4*  HCT 31.0* 30.3*  PLT  --  153  TROPONINI  --  0.03    CrCl cannot be calculated (Patient has no serum creatinine result on file.).   Medical History: Past Medical History  Diagnosis Date  . Thyroid disease   . High cholesterol   . Hypertension     sees Dr. Dione Housekeeper  . Diabetes mellitus     "diet controlled"  . Arthritis   . Kidney failure     Hemodialysis TThu Sat  . Hypothyroidism   . Anemia   . History of blood transfusion     Medications:  Prescriptions prior to admission  Medication Sig Dispense Refill Last Dose  . acetaminophen (TYLENOL) 325 MG tablet Take 650 mg by mouth every 6 (six) hours as needed for mild pain.   02/03/2015 at Unknown time  . aspirin EC 81 MG tablet Take 81 mg by mouth daily.     02/04/2015 at 0830  . colchicine 0.6 MG tablet TAKE ONE TABLET BY MOUTH EVERY DAY   02/03/2015 at Unknown time  . ezetimibe (ZETIA) 10 MG tablet Take 10 mg by mouth daily.    02/03/2015 at Unknown time  . gabapentin (NEURONTIN) 100 MG capsule Take 100 mg by mouth at bedtime.    02/03/2015 at Unknown time  . levothyroxine (SYNTHROID, LEVOTHROID) 100 MCG tablet Take 100 mcg by mouth daily.     02/04/2015 at 0830  . midodrine (PROAMATINE) 10 MG tablet Take 10 mg by  mouth. Take on Tuesdays, Thursdays and Saturday before dialysis   02/03/2015  . multivitamin (RENA-VIT) TABS tablet Take 1 tablet by mouth daily.   02/03/2015 at Unknown time  . sevelamer carbonate (RENVELA) 800 MG tablet Take 1,600 mg by mouth 3 (three) times daily with meals.    02/03/2015 at Unknown time  . Vitamin D, Ergocalciferol, (DRISDOL) 50000 UNITS CAPS Take 50,000 Units by mouth every 30 (thirty) days.     02/02/2015 at Unknown time  . ZEMPLAR 1 MCG capsule Take 1 capsule by mouth every Monday, Wednesday, and Friday.    Not Taking  . ACCU-CHEK AVIVA PLUS test strip    Not Taking   Scheduled:  . Chlorhexidine Gluconate Cloth  6 each Topical Once  . [START ON 02/05/2015] darbepoetin (ARANESP) injection - DIALYSIS  60 mcg Intravenous Q Thu-HD  . [START ON 02/05/2015] doxercalciferol  2 mcg Intravenous Q T,Th,Sa-HD  . ezetimibe  10 mg Oral Daily  . [START ON 02/05/2015] ferric gluconate (FERRLECIT/NULECIT) IV  125 mg Intravenous Q T,Th,Sa-HD  . gabapentin  100 mg Oral QHS  . levothyroxine  100 mcg Oral Daily  . midodrine  10 mg Oral Daily  . multivitamin  1 tablet Oral Daily  . sevelamer carbonate  1,600 mg Oral TID  WC  . sodium chloride  3 mL Intravenous Q12H    Assessment: 74 y.o female with ESRD secondary to DM on HD qTTS since August who has had multiple AVF difficulties and subsequently had ligation of left AVF today due to steal.The patient developed new onset afib in the pre op area today 02/04/15. Due to severe steal symptoms left hand and brief nature of the procedure, her AVF was ligated and she was admitted for further treatment of her afib. Post op her heart rate is in the 80's inatrial fibrillation. EKG shows no ST changes per Dr. Theodosia Blender assessment.  Dr. Radford Pax has ordered pharmacy to begin IV heparin and oral warfarin for new onset Afib. Her PLTC is 153K, Hgb is 9.4 and Hct is 30.3. No bleeding reported.  She was NOT taking any anticoagulation PTA. The warfarin predictor  points for this patient is 3 points.   Goal of Therapy:  Heparin level 0.3-0.7 units/ml Monitor platelets by anticoagulation protocol: Yes   Plan:  Baseline INR, aPTT, HL now Give heparin 2000 units IV bolus x1 Start heparin IV drip at rate of 1100 units/hr Coumadin 5 mg po tonight x1  Check heparin level in 8 hours Daily heparin level and CBG Monitor for signs and symptoms of bleeding.   Thank you for allowing pharmacy to be part of this patients care team. Nicole Cella, [place] Clinical Pharmacist Pager: [phone] 02/04/2015,5:27 PM

## 2015-02-04 NOTE — Progress Notes (Signed)
Patient arrived to unit via PACU. Alert and oriented. Family at bedside. No questions or concerns at this time.

## 2015-02-04 NOTE — Transfer of Care (Signed)
Immediate Anesthesia Transfer of Care Note  Patient: [name]  Procedure(s) Performed: Procedure(s): LIGATION OF BRACHIOCEPHALIC ARTERIOVENOUS  FISTULA (Left)  Patient Location: PACU  Anesthesia Type:MAC  Level of Consciousness: awake, alert , oriented and patient cooperative  Airway & Oxygen Therapy: Patient Spontanous Breathing and Patient connected to face mask oxygen  Post-op Assessment: Report given to RN, Post -op Vital signs reviewed and stable and Patient moving all extremities X 4  Post vital signs: Reviewed and stable  Last Vitals:  Filed Vitals:   02/04/15 1147  BP: 118/78  Pulse: 87  Temp: 37.1 C  Resp: 16    Complications: No apparent anesthesia complications

## 2015-02-04 NOTE — Consult Note (Signed)
[facility] Renal Consultation Note    Indication for Consultation:  Management of ESRD/hemodialysis; anemia, hypertension/volume and secondary hyperparathyroidism PCP: Dr. Dione Housekeeper  HPI: [name] is a 74 y.o. female with ESRD secondary to DM on TTS dialysis at the [place] since August who has had multiple AVF difficulties and subsequently had ligation of AVF today due to steal.  She had new onset afib in the pre op area. Due to steal symptoms and brief nature of the procedure, her AVF was ligated and she was admitted for further treatment of her afib.  She previously had been on norvasc but it was stopped due to low BP and midodrine added for BP support pre HD. Her heart rate has been fairly consistent pre and post HD in the 60s. Her average UF volumes are 1.7 - 3 and last got to EDW 10/29 after a UF of 80.5  I stat K today was 4.7.  She is due for dialysis tomorrow.  She denies having an irregular heart rhythm before or heart racing. She has had no CP, SOB, chest pressure. Her hand is still numb but feels better post ligation.  Past Medical History  Diagnosis Date  . Thyroid disease   . High cholesterol   . Hypertension     sees Dr. Dione Housekeeper  . Diabetes mellitus     "diet controlled"  . Arthritis   . Kidney failure     Hemodialysis TThu Sat  . Hypothyroidism   . Anemia   . History of blood transfusion    Past Surgical History  Procedure Laterality Date  . Joint replacement Bilateral     bilateral knee  . Joint replacement Right     shoulder  . Total knee arthroplasty      right knee  . Eye surgery Bilateral     bilateral cataract removal  . Dilation and curettage of uterus    . Tubal ligation    . Av fistula placement  04/03/2012    Procedure: ARTERIOVENOUS (AV) FISTULA CREATION;  Surgeon: Elam Dutch, MD;  Location: [facility] OR;  Service: Vascular;  Laterality: Left;  creation left brachial cephalic fistula    Family History  Problem Relation Age  of Onset  . Diabetes Sister   . Cancer Brother   . Hyperlipidemia Daughter   . Hypertension Daughter   . Hypertension Son   . Diabetes Father     before age 39   Social History:  reports that she quit smoking about 10 years ago. Her smoking use included Cigarettes. She has never used smokeless tobacco. She reports that she does not drink alcohol or use illicit drugs. Allergies  Allergen Reactions  . Sulfa Drugs Cross Reactors Hives and Itching  . Amoxicillin Rash  . Percocet [Oxycodone-Acetaminophen] Itching and Rash    Did not happen last time she took it   Prior to Admission medications   Medication Sig Start Date End Date Taking? Authorizing Provider  acetaminophen (TYLENOL) 325 MG tablet Take 650 mg by mouth every 6 (six) hours as needed for mild pain.   Yes Historical Provider, MD  aspirin EC 81 MG tablet Take 81 mg by mouth daily.     Yes Historical Provider, MD  colchicine 0.6 MG tablet TAKE ONE TABLET BY MOUTH EVERY DAY 09/25/14  Yes Historical Provider, MD  ezetimibe (ZETIA) 10 MG tablet Take 10 mg by mouth daily.    Yes Historical Provider, MD  gabapentin (NEURONTIN) 100 MG capsule  Take 100 mg by mouth at bedtime.  12/22/14 12/22/15 Yes Historical Provider, MD  levothyroxine (SYNTHROID, LEVOTHROID) 100 MCG tablet Take 100 mcg by mouth daily.     Yes Historical Provider, MD  midodrine (PROAMATINE) 10 MG tablet Take 10 mg by mouth. Take on Tuesdays, Thursdays and Saturday before dialysis   Yes Historical Provider, MD  multivitamin (RENA-VIT) TABS tablet Take 1 tablet by mouth daily.   Yes Historical Provider, MD  sevelamer carbonate (RENVELA) 800 MG tablet Take 1,600 mg by mouth 3 (three) times daily with meals.    Yes Historical Provider, MD  Vitamin D, Ergocalciferol, (DRISDOL) 50000 UNITS CAPS Take 50,000 Units by mouth every 30 (thirty) days.     Yes Historical Provider, MD  ZEMPLAR 1 MCG capsule Take 1 capsule by mouth every Monday, Wednesday, and Friday.  02/27/12  Yes  Historical Provider, MD  Golinda test strip  11/11/14   Historical Provider, MD   Current Facility-Administered Medications  Medication Dose Route Frequency Provider Last Rate Last Dose  . 0.9 %  sodium chloride infusion   Intravenous Continuous Conrad Iota, MD 10 mL/hr at 02/04/15 1222    . 0.9 %  sodium chloride infusion  250 mL Intravenous PRN Melton Alar, PA-C      . acetaminophen (TYLENOL) tablet 650 mg  650 mg Oral Q4H PRN Melton Alar, PA-C      . Chlorhexidine Gluconate Cloth 2 % PADS 6 each  6 each Topical Once Conrad Ironton, MD      . ezetimibe (ZETIA) tablet 10 mg  10 mg Oral Daily Melton Alar, PA-C      . fentaNYL (SUBLIMAZE) injection 25-50 mcg  25-50 mcg Intravenous Q5 min PRN Annye Asa, MD      . gabapentin (NEURONTIN) capsule 100 mg  100 mg Oral QHS Melton Alar, PA-C      . levothyroxine (SYNTHROID, LEVOTHROID) tablet 100 mcg  100 mcg Oral Daily Melton Alar, PA-C      . meperidine (DEMEROL) injection 6.25-12.5 mg  6.25-12.5 mg Intravenous Q5 min PRN Annye Asa, MD      . midazolam (VERSED) injection 0.5-2 mg  0.5-2 mg Intravenous Once PRN Annye Asa, MD      . midodrine (PROAMATINE) tablet 10 mg  10 mg Oral Daily Melton Alar, PA-C      . multivitamin (RENA-VIT) tablet 1 tablet  1 tablet Oral Daily Bobby Rumpf York, PA-C      . ondansetron (ZOFRAN) injection 4 mg  4 mg Intravenous Q6H PRN Melton Alar, PA-C      . promethazine (PHENERGAN) injection 6.25-12.5 mg  6.25-12.5 mg Intravenous Q15 min PRN Annye Asa, MD      . sevelamer carbonate (RENVELA) tablet 1,600 mg  1,600 mg Oral TID WC Marianne L York, PA-C      . sodium chloride 0.9 % injection 3 mL  3 mL Intravenous Q12H Marianne L York, PA-C      . sodium chloride 0.9 % injection 3 mL  3 mL Intravenous PRN Melton Alar, PA-C      . Vitamin D (Ergocalciferol) (DRISDOL) capsule 50,000 Units  50,000 Units Oral Q30 days Melton Alar, PA-C       Labs: Basic  Metabolic Panel:  Recent Labs Lab 02/04/15 1224  NA 138  K 4.7  GLUCOSE 86   CBC:  Recent Labs Lab 02/04/15 1224  HGB 10.5*  HCT 31.0*   CBG:  Recent Labs Lab 02/04/15 1151 02/04/15 1443  GLUCAP 66 95   ROS: As per HPI otherwise negative.  Physical Exam: Filed Vitals:   02/04/15 1445 02/04/15 1500 02/04/15 1515 02/04/15 1530  BP:  111/73 130/58 107/74  Pulse: 89 69 76 71  Temp:      TempSrc:      Resp: 19 16 14 17   Height:      Weight:      SpO2: 100% 100% 100% 100%     General: Pleasant elderly WF in no acute distress in PACU Head: Normocephalic, atraumatic, sclera non-icteric, mucus membranes are moist Neck: Supple. JVD not elevated. Lungs: Clear bilaterally to auscultation without wheezes, rales, or rhonchi. Breathing is unlabored. Heart: irreg irreg Abdomen: Soft, non-tender, non-distended with normoactive bowel sounds.  Lower extremities: without edema or ischemic changes, no open wounds  Neuro: Alert and oriented X 3. Moves all extremities spontaneously. Psych:  Responds to questions appropriately with a normal affect. Dialysis Access: right IJ s/p ligation of L AVF left hand warm well perfused  Dialysis Orders: Center: [facility] 4hr 160 350/800 EDW 81.5 2 K 2 Ca profile 4 right IJ hectorol 2 heparin 2400 venofer 50/week Mircera 71 q 2 weeks start 11/3 Recent labs:  Hgb 9.5 35%sat ferrigin 364 iPTH 256 vit D 50.5 corr Ca 9.9 P 6.3  Assessment/Plan: 1.  new onset afib -work up by cards 2. S/p ligation of  Left AVF due to steal - Dr. Bridgett Larsson 3.  ESRD -  TTS - HD tomorrow via catheter 4.  BP/volume  - BP meds had been stopped due to low BPs and midodrine added 5.  Anemia  - continue ESA dose Thursday and add short course Fe 6.  Metabolic bone disease -  Can stop oral vitamin D - levels more than adequate- continue Hectorol and renvela 7.  Nutrition - renal diet/vitamin 8. Hypothyroidism - on synthroid  Myriam Jacobson, PA-C [place] [phone] 02/04/2015, 4:25 PM   Pt seen, examined and agree w A/P as above.  Kelly Splinter MD [employer] pager [phone]    cell [phone] 02/04/2015, 5:19 PM

## 2015-02-04 NOTE — Progress Notes (Addendum)
ANTICOAGULATION CONSULT NOTE - Initial Consult  Pharmacy Consult for Heparin and Warfarin Indication: new onset atrial fibrillation  Allergies  Allergen Reactions  . Sulfa Drugs Cross Reactors Hives and Itching  . Amoxicillin Rash  . Percocet [Oxycodone-Acetaminophen] Itching and Rash    Did not happen last time she took it    Patient Measurements: Height: 5\' 6"  (167.6 cm) (taken on 02/04/15) Weight: 179 lb (81.194 kg) (from 02/04/15) IBW/kg (Calculated) : 59.3 Heparin Dosing Weight: 76.2 kg  Vital Signs: Temp: 98 F (36.7 C) (11/02 1600) Temp Source: Oral (11/02 1147) BP: 123/63 mmHg (11/02 1715) Pulse Rate: 79 (11/02 1715)  Labs:  Recent Labs  02/04/15 1224 02/04/15 1630  HGB 10.5* 9.4*  HCT 31.0* 30.3*  PLT  --  153  CREATININE  --  5.01*  TROPONINI  --  0.03    Estimated Creatinine Clearance: 10.6 mL/min (by C-G formula based on Cr of 5.01).   Medical History: Past Medical History  Diagnosis Date  . Thyroid disease   . High cholesterol   . Hypertension     sees Dr. Dione Housekeeper  . Diabetes mellitus     "diet controlled"  . Arthritis   . Kidney failure     Hemodialysis TThu Sat  . Hypothyroidism   . Anemia   . History of blood transfusion     Medications:  Prescriptions prior to admission  Medication Sig Dispense Refill Last Dose  . acetaminophen (TYLENOL) 325 MG tablet Take 650 mg by mouth every 6 (six) hours as needed for mild pain.   02/03/2015 at Unknown time  . aspirin EC 81 MG tablet Take 81 mg by mouth daily.     02/04/2015 at 0830  . colchicine 0.6 MG tablet TAKE ONE TABLET BY MOUTH EVERY DAY   02/03/2015 at Unknown time  . ezetimibe (ZETIA) 10 MG tablet Take 10 mg by mouth daily.    02/03/2015 at Unknown time  . gabapentin (NEURONTIN) 100 MG capsule Take 100 mg by mouth at bedtime.    02/03/2015 at Unknown time  . levothyroxine (SYNTHROID, LEVOTHROID) 100 MCG tablet Take 100 mcg by mouth daily.     02/04/2015 at 0830  . midodrine (PROAMATINE)  10 MG tablet Take 10 mg by mouth. Take on Tuesdays, Thursdays and Saturday before dialysis   02/03/2015  . multivitamin (RENA-VIT) TABS tablet Take 1 tablet by mouth daily.   02/03/2015 at Unknown time  . sevelamer carbonate (RENVELA) 800 MG tablet Take 1,600 mg by mouth 3 (three) times daily with meals.    02/03/2015 at Unknown time  . Vitamin D, Ergocalciferol, (DRISDOL) 50000 UNITS CAPS Take 50,000 Units by mouth every 30 (thirty) days.     02/02/2015 at Unknown time  . ZEMPLAR 1 MCG capsule Take 1 capsule by mouth every Monday, Wednesday, and Friday.    Not Taking  . ACCU-CHEK AVIVA PLUS test strip    Not Taking   Scheduled:  . coumadin book   Does not apply Once  . [START ON 02/05/2015] darbepoetin (ARANESP) injection - DIALYSIS  60 mcg Intravenous Q Thu-HD  . [START ON 02/05/2015] doxercalciferol  2 mcg Intravenous Q T,Th,Sa-HD  . [START ON 02/05/2015] ezetimibe  10 mg Oral Daily  . [START ON 02/05/2015] ferric gluconate (FERRLECIT/NULECIT) IV  125 mg Intravenous Q T,Th,Sa-HD  . gabapentin  100 mg Oral QHS  . [START ON 02/05/2015] levothyroxine  100 mcg Oral QAC breakfast  . midodrine  10 mg Oral Daily  . multivitamin  1 tablet Oral Daily  . sevelamer carbonate  1,600 mg Oral TID WC  . sodium chloride  3 mL Intravenous Q12H  . warfarin  5 mg Oral ONCE-1800  . [START ON 02/05/2015] warfarin   Does not apply Once  . Warfarin - Pharmacist Dosing Inpatient   Does not apply q1800    Assessment: 74 y.o female with ESRD secondary to DM on HD qTTS since August who has had multiple AVF difficulties and subsequently had ligation of left AVF today due to steal.The patient developed new onset afib in the pre op area today 02/04/15. Due to severe steal symptoms left hand and brief nature of the procedure, her AVF was ligated and she was admitted for further treatment of her afib. Post op her heart rate is in the 80's inatrial fibrillation. EKG shows no ST changes per Dr. Theodosia Blender assessment.  Dr. Radford Pax  has ordered pharmacy to begin IV heparin and oral warfarin for new onset Afib. Her PLTC is 153K, Hgb is 9.4 and Hct is 30.3. No bleeding reported.  She was NOT taking any anticoagulation PTA. The warfarin predictor points for this patient is 3 points.   Goal of Therapy:  Heparin level 0.3-0.7 units/ml Monitor platelets by anticoagulation protocol: Yes   Plan:  Baseline INR, aPTT, HL now Start heparin IV drip at rate of 1100 units/hr Coumadin 5 mg po tonight x1  Check heparin level in 8 hours Daily heparin level and CBG Monitor for signs and symptoms of bleeding.   Thank you for allowing pharmacy to be part of this patients care team. Nicole Cella, RPh Clinical Pharmacist Pager: [phone] 02/04/2015,6:33 PM   Addendum:  Baseline labs: INR 1.04, HL <0.1, aPTT 41  pltc 153, Hgb 9.4  Nicole Cella, RPh Clinical Pharmacist Pager: [phone] 02/04/2015 8:44 PM

## 2015-02-05 ENCOUNTER — Inpatient Hospital Stay (HOSPITAL_COMMUNITY): Payer: Medicare Other

## 2015-02-05 ENCOUNTER — Encounter (HOSPITAL_COMMUNITY): Payer: Self-pay | Admitting: Vascular Surgery

## 2015-02-05 DIAGNOSIS — I4891 Unspecified atrial fibrillation: Secondary | ICD-10-CM

## 2015-02-05 DIAGNOSIS — T82898D Other specified complication of vascular prosthetic devices, implants and grafts, subsequent encounter: Secondary | ICD-10-CM

## 2015-02-05 DIAGNOSIS — E119 Type 2 diabetes mellitus without complications: Secondary | ICD-10-CM

## 2015-02-05 DIAGNOSIS — N186 End stage renal disease: Secondary | ICD-10-CM

## 2015-02-05 DIAGNOSIS — E039 Hypothyroidism, unspecified: Secondary | ICD-10-CM

## 2015-02-05 DIAGNOSIS — D649 Anemia, unspecified: Secondary | ICD-10-CM | POA: Insufficient documentation

## 2015-02-05 DIAGNOSIS — D631 Anemia in chronic kidney disease: Secondary | ICD-10-CM

## 2015-02-05 LAB — HEMOGLOBIN A1C
Hgb A1c MFr Bld: 5 % (ref 4.8–5.6)
Mean Plasma Glucose: 97 mg/dL

## 2015-02-05 LAB — TROPONIN I: Troponin I: 0.03 ng/mL (ref <0.031)

## 2015-02-05 LAB — CBC
HCT: 28.6 % — ABNORMAL LOW (ref 36.0–46.0)
HCT: 27 % — ABNORMAL LOW (ref 36.0–46.0)
Hemoglobin: 9 g/dL — ABNORMAL LOW (ref 12.0–15.0)
Hemoglobin: 8.7 g/dL — ABNORMAL LOW (ref 12.0–15.0)
MCH: 30.7 pg (ref 26.0–34.0)
MCH: 31.1 pg (ref 26.0–34.0)
MCHC: 31.5 g/dL (ref 30.0–36.0)
MCHC: 32.2 g/dL (ref 30.0–36.0)
MCV: 97.6 fL (ref 78.0–100.0)
MCV: 96.4 fL (ref 78.0–100.0)
Platelets: 165 K/uL (ref 150–400)
Platelets: 151 K/uL (ref 150–400)
RBC: 2.93 MIL/uL — ABNORMAL LOW (ref 3.87–5.11)
RBC: 2.8 MIL/uL — ABNORMAL LOW (ref 3.87–5.11)
RDW: 15 % (ref 11.5–15.5)
RDW: 14.6 % (ref 11.5–15.5)
WBC: 6.3 K/uL (ref 4.0–10.5)
WBC: 4.9 K/uL (ref 4.0–10.5)

## 2015-02-05 LAB — RENAL FUNCTION PANEL
Albumin: 2.8 g/dL — ABNORMAL LOW (ref 3.5–5.0)
Anion gap: 9 (ref 5–15)
BUN: 42 mg/dL — ABNORMAL HIGH (ref 6–20)
CO2: 32 mmol/L (ref 22–32)
Calcium: 8.4 mg/dL — ABNORMAL LOW (ref 8.9–10.3)
Chloride: 94 mmol/L — ABNORMAL LOW (ref 101–111)
Creatinine, Ser: 6.01 mg/dL — ABNORMAL HIGH (ref 0.44–1.00)
GFR calc Af Amer: 7 mL/min — ABNORMAL LOW
GFR calc non Af Amer: 6 mL/min — ABNORMAL LOW
Glucose, Bld: 127 mg/dL — ABNORMAL HIGH (ref 65–99)
Phosphorus: 5.3 mg/dL — ABNORMAL HIGH (ref 2.5–4.6)
Potassium: 3.9 mmol/L (ref 3.5–5.1)
Sodium: 135 mmol/L (ref 135–145)

## 2015-02-05 LAB — BASIC METABOLIC PANEL
Anion gap: 10 (ref 5–15)
BUN: 40 mg/dL — ABNORMAL HIGH (ref 6–20)
CO2: 33 mmol/L — ABNORMAL HIGH (ref 22–32)
Calcium: 8.7 mg/dL — ABNORMAL LOW (ref 8.9–10.3)
Chloride: 97 mmol/L — ABNORMAL LOW (ref 101–111)
Creatinine, Ser: 5.93 mg/dL — ABNORMAL HIGH (ref 0.44–1.00)
GFR calc Af Amer: 7 mL/min — ABNORMAL LOW (ref >60)
GFR calc non Af Amer: 6 mL/min — ABNORMAL LOW (ref >60)
Glucose, Bld: 72 mg/dL (ref 65–99)
Potassium: 4.7 mmol/L (ref 3.5–5.1)
Sodium: 140 mmol/L (ref 135–145)

## 2015-02-05 LAB — HEPARIN LEVEL (UNFRACTIONATED)
Heparin Unfractionated: 0.34 [IU]/mL (ref 0.30–0.70)
Heparin Unfractionated: 0.39 [IU]/mL (ref 0.30–0.70)

## 2015-02-05 LAB — HEPATITIS B SURFACE ANTIGEN: Hepatitis B Surface Ag: NEGATIVE

## 2015-02-05 LAB — PROTIME-INR
INR: 1.12 (ref 0.00–1.49)
Prothrombin Time: 14.6 s (ref 11.6–15.2)

## 2015-02-05 LAB — GLUCOSE, CAPILLARY
Glucose-Capillary: 81 mg/dL (ref 65–99)
Glucose-Capillary: 84 mg/dL (ref 65–99)

## 2015-02-05 MED ORDER — DOXERCALCIFEROL 4 MCG/2ML IV SOLN
INTRAVENOUS | Status: AC
  Filled 2015-02-05: qty 2

## 2015-02-05 MED ORDER — LIDOCAINE-PRILOCAINE 2.5-2.5 % EX CREA: 1.0000 "application " | TOPICAL_CREAM | CUTANEOUS | Status: DC | PRN

## 2015-02-05 MED ORDER — DARBEPOETIN ALFA 60 MCG/0.3ML IJ SOSY
PREFILLED_SYRINGE | INTRAMUSCULAR | Status: AC
  Administered 2015-02-05: 60 ug via INTRAVENOUS
  Filled 2015-02-05: qty 0.3

## 2015-02-05 MED ORDER — HEPARIN SODIUM (PORCINE) 1000 UNIT/ML DIALYSIS: 1000.0000 [IU] | INTRAMUSCULAR | Status: DC | PRN

## 2015-02-05 MED ORDER — LIDOCAINE HCL (PF) 1 % IJ SOLN: 5.0000 mL | INTRAMUSCULAR | Status: DC | PRN

## 2015-02-05 MED ORDER — SODIUM CHLORIDE 0.9 % IV SOLN: 100.0000 mL | INTRAVENOUS | Status: DC | PRN

## 2015-02-05 MED ORDER — PENTAFLUOROPROP-TETRAFLUOROETH EX AERO: 1.0000 "application " | INHALATION_SPRAY | CUTANEOUS | Status: DC | PRN

## 2015-02-05 MED ORDER — ALTEPLASE 2 MG IJ SOLR
2.0000 mg | Freq: Once | INTRAMUSCULAR | Status: DC | PRN
  Filled 2015-02-05: qty 2

## 2015-02-05 MED ORDER — MIDODRINE HCL 5 MG PO TABS
ORAL_TABLET | ORAL | Status: AC
  Filled 2015-02-05: qty 2

## 2015-02-05 MED ORDER — WARFARIN SODIUM 5 MG PO TABS
5.0000 mg | ORAL_TABLET | Freq: Once | ORAL | Status: AC
  Administered 2015-02-05: 5 mg via ORAL
  Filled 2015-02-05: qty 1

## 2015-02-05 NOTE — Progress Notes (Signed)
  [facility] Progress Note   Subjective: doing well , no complaints  Filed Vitals:   02/04/15 1715 02/04/15 2152 02/05/15 0450 02/05/15 0736  BP: 123/63 95/57 98/60    Pulse: 79 71 78   Temp:  99 F (37.2 C) 98.1 F (36.7 C)   TempSrc:  Oral Oral   Resp: 20 18 20    Height:    5' 5.5" (1.664 m)  Weight:    80.4 kg (177 lb 4 oz)  SpO2: 99% 97% 98%    Exam: Alert, up in chair No jvd Chest clear bilat Irreg irreg, no RG Abd soft ntnd no mass or ascites No LE edema LUA wound is clean, L hand warm Neuro nf, Ox 3 R IJ cath  [facility] TTS   4h   F160  350/800   81.5kg   2/2 bath  P4  R IJ cath  Hep 2400 hectorol 2 venofer 50/week  mircera 71 q 2 weeks start 11/3 recent labs: Hgb 9.5 35%sat ferrigin 364 iPTH 256 vit D 50.5 corr Ca 9.9 P 6.3   Assessment: 1 New onset afib - on IV hep, cardiology seeing.  Try to avoid outpatient lovenox in ESRD pts there can be drug accumulation and bleeding complications.   2 Steal syndrome s/p AVF ligation 3 ESRD TTS hd 4 Hypotension on midodrine 5 Anemia cont esa , IV Fe 6 MBD stop po vit D, cont Hect/ renvela 7 Hypothyroid on Rx 8 Vol is slightly under dry wt   Plan - HD today, keep even   Kelly Splinter MD [facility] pager [phone]    cell [phone] 02/05/2015, 11:37 AM    Recent Labs Lab 02/04/15 1224 02/04/15 1630 02/05/15 0345  NA 138 141 140  K 4.7 4.6 4.7  CL  --  98* 97*  CO2  --  32 33*  GLUCOSE 86 84 72  BUN  --  29* 40*  CREATININE  --  5.01* 5.93*  CALCIUM  --  8.7* 8.7*  PHOS  --  5.9*  --     Recent Labs Lab 02/04/15 1630  ALBUMIN 3.1*    Recent Labs Lab 02/04/15 1224 02/04/15 1630 02/05/15 0302  WBC  --  5.4 6.3  HGB 10.5* 9.4* 9.0*  HCT 31.0* 30.3* 28.6*  MCV  --  98.1 97.6  PLT  --  153 165   . darbepoetin (ARANESP) injection - DIALYSIS  60 mcg Intravenous Q Thu-HD  . doxercalciferol  2 mcg Intravenous Q T,Th,Sa-HD  . ezetimibe  10 mg Oral Daily  . ferric  gluconate (FERRLECIT/NULECIT) IV  125 mg Intravenous Q T,Th,Sa-HD  . gabapentin  100 mg Oral QHS  . levothyroxine  100 mcg Oral QAC breakfast  . midodrine  10 mg Oral Q T,Th,Sa-HD  . multivitamin  1 tablet Oral Daily  . sevelamer carbonate  1,600 mg Oral TID WC  . sodium chloride  3 mL Intravenous Q12H  . warfarin  5 mg Oral ONCE-1800  . warfarin   Does not apply Once  . Warfarin - Pharmacist Dosing Inpatient   Does not apply q1800   . heparin 1,100 Units/hr (02/04/15 1829)   sodium chloride, acetaminophen, ondansetron (ZOFRAN) IV, sodium chloride

## 2015-02-05 NOTE — Progress Notes (Signed)
The site of the patient old fistula has become more swollen throughout the shift. Vascular and hospitalits have both been made aware.   [name], Mervin Kung RN

## 2015-02-05 NOTE — Progress Notes (Signed)
VASCULAR SURGERY  This patient had ligation of a left brachiocephalic AV fistula by Dr. Adele Barthel on 02/04/15. She was admitted after the procedure because of atrial fibrillation and is now on heparin. I was called this evening because of some swelling at the site of her incision in the left arm.  On exam she has a hematoma at the incision but no active bleeding. This does not appear to be pulsatile. Given that she needs to be on heparin for her atrial fibrillation I placed a pressure bandage on the wound and will follow up tomorrow. If this shows any evidence of enlargement and she could potentially require exploration. However she has very thin skin and would be at significant risk for wound healing problems. We will follow.  Michelle Mayo, MD, [place] [phone] Office: [phone]

## 2015-02-05 NOTE — Evaluation (Signed)
Physical Therapy Evaluation Patient Details Name: [name] MRN: [id number] DOB: 16-Jun-1940 Today's Date: 02/05/2015   History of Present Illness  pt is a 74 y/o female with h/o DM with kidney failure with HD, admitted for re-evaluation of permanent dialysis access.  pt showing steal sx fo numbness and weakness in her left hand.  Pt also showing sign of new onset afib.  pt s/p removal of AVF 11/02. Work up for [employer] continues.  Clinical Impression  Pt admitted with/for removal of AVG due to steal syndrome/ afib.  Pt currently limited functionally due to the problems listed below.  (see problems list.)  Pt will benefit from PT to maximize function and safety to be able to get home safely with available assist of family.     Follow Up Recommendations No PT follow up    Equipment Recommendations  None recommended by PT    Recommendations for Other Services       Precautions / Restrictions Precautions Precautions: Fall      Mobility  Bed Mobility                  Transfers Overall transfer level: Needs assistance   Transfers: Sit to/from Stand Sit to Stand: Min assist         General transfer comment: assist to come forward and power up.  cues for hand placement  Ambulation/Gait Ambulation/Gait assistance: Min guard Ambulation Distance (Feet): 240 Feet Assistive device: Rolling walker (2 wheeled) Gait Pattern/deviations: Step-through pattern   Gait velocity interpretation: at or above normal speed for age/gender General Gait Details: mildly unsteady with safe use of the RW until fatigue then trails RW too much.  Stairs            Wheelchair Mobility    Modified Rankin (Stroke Patients Only)       Balance Overall balance assessment: Needs assistance Sitting-balance support: No upper extremity supported Sitting balance-Leahy Scale: Good     Standing balance support: No upper extremity supported Standing balance-Leahy Scale: Fair                               Pertinent Vitals/Pain Pain Assessment: Faces Faces Pain Scale: Hurts even more Pain Location: L hand Pain Descriptors / Indicators: Aching;Pins and needles Pain Intervention(s): Monitored during session    Home Living Family/patient expects to be discharged to:: Private residence Living Arrangements: Children Available Help at Discharge: Family;Available 24 hours/day (daughter in law doesn't work) Type of Home: House Home Access: Stairs to enter   Technical brewer of Steps: 1 Home Layout: One level Home Equipment: Environmental consultant - 2 wheels;Walker - 4 wheels;Cane - quad;Wheelchair - Primary school teacher, hospital bed soon)      Prior Function Level of Independence: Independent with assistive device(s)               Hand Dominance        Extremity/Trunk Assessment   Upper Extremity Assessment:  (not MTT, decr use of L UE, but used on RW well.)           Lower Extremity Assessment: LLE deficits/detail;Overall WFL for tasks assessed   LLE Deficits / Details: L LE noticeably weaker than R LE,  proximal weakness.     Communication      Cognition Arousal/Alertness: Awake/alert Behavior During Therapy: WFL for tasks assessed/performed Overall Cognitive Status: Within Functional Limits for tasks assessed  General Comments      Exercises        Assessment/Plan    PT Assessment Patient needs continued PT services  PT Diagnosis Generalized weakness;Acute pain   PT Problem List Decreased strength;Decreased activity tolerance;Decreased balance;Decreased mobility;Cardiopulmonary status limiting activity  PT Treatment Interventions DME instruction;Gait training;Functional mobility training;Therapeutic activities;Patient/family education   PT Goals (Current goals can be found in the Care Plan section) Acute Rehab PT Goals Patient Stated Goal: Independent, feeling better, PT Goal Formulation: With  patient Time For Goal Achievement: 02/12/15 Potential to Achieve Goals: Good    Frequency Min 3X/week   Barriers to discharge        Co-evaluation               End of Session   Activity Tolerance: Patient tolerated treatment well Patient left: in chair;with call bell/phone within reach;with family/visitor present Nurse Communication: Mobility status         Time: [id number] PT Time Calculation (min) (ACUTE ONLY): 28 min   Charges:   PT Evaluation $Initial PT Evaluation Tier I: 1 Procedure PT Treatments $Gait Training: 8-22 mins   PT G Codes:        Akeema Broder, Tessie Fass 02/05/2015, 1:46 PM  02/05/2015  Donnella Sham, PT [phone]  (pager)

## 2015-02-05 NOTE — Progress Notes (Signed)
ANTICOAGULATION CONSULT NOTE - Follow Up Consult  Pharmacy Consult for Heparin and Coumadin Indication: atrial fibrillation, new onset  Allergies  Allergen Reactions  . Sulfa Drugs Cross Reactors Hives and Itching  . Amoxicillin Rash  . Percocet [Oxycodone-Acetaminophen] Itching and Rash    Did not happen last time she took it    Patient Measurements: Height: 5' 5.5" (166.4 cm) Weight: 177 lb 4 oz (80.4 kg) IBW/kg (Calculated) : 58.15 Heparin Dosing Weight: 76 kg  Vital Signs: Temp: 98.1 F (36.7 C) (11/03 0450) Temp Source: Oral (11/03 0450) BP: 98/60 mmHg (11/03 0450) Pulse Rate: 78 (11/03 0450)  Labs:  Recent Labs  02/04/15 1224 02/04/15 1630 02/04/15 1833 02/04/15 2125 02/05/15 0302 02/05/15 0345 02/05/15 1012  HGB 10.5* 9.4*  --   --  9.0*  --   --   HCT 31.0* 30.3*  --   --  28.6*  --   --   PLT  --  153  --   --  165  --   --   APTT  --   --  41*  --   --   --   --   LABPROT  --   --  13.8  --  14.6  --   --   INR  --   --  1.04  --  1.12  --   --   HEPARINUNFRC  --   --  <0.10*  --  0.34  --  0.39  CREATININE  --  5.01*  --   --   --  5.93*  --   TROPONINI  --  0.03  --  0.03  --  0.03  --    Assessment:   Heparin and Coumadin begun 02/04/15 for new onset afib post-op ligation of left AVF due to steal syndrome.   Heparin level remains therapeutic (0.39) on 1100 units/hr.   INR 1.12 after 1 dose of Coumadin 5 mg on 11/2.   To avoid outpatient Lovenox due to ESRD and risk of accumulation and bleeding complications.   To stay on IV heparin until INR >2    Goal of Therapy:  INR 2-3 Heparin level 0.3-0.7 units/ml Monitor platelets by anticoagulation protocol: Yes   Plan:   Continue heparin drip at 1100 units/hr.  Coumadin 5 mg already ordered for today.  Daily heparin level, PT/INR and CBC.  Arty Baumgartner, [place] Pager: [phone] 02/05/2015,1:29 PM

## 2015-02-05 NOTE — Progress Notes (Signed)
ANTICOAGULATION CONSULT NOTE - Follow-up Consult  Pharmacy Consult for Heparin and Warfarin Indication: new onset atrial fibrillation  Allergies  Allergen Reactions  . Sulfa Drugs Cross Reactors Hives and Itching  . Amoxicillin Rash  . Percocet [Oxycodone-Acetaminophen] Itching and Rash    Did not happen last time she took it    Patient Measurements: Height: 5\' 6"  (167.6 cm) (taken on 02/04/15) Weight: 179 lb (81.194 kg) (from 02/04/15) IBW/kg (Calculated) : 59.3 Heparin Dosing Weight: 76.2 kg  Vital Signs: Temp: 99 F (37.2 C) (11/02 2152) Temp Source: Oral (11/02 2152) BP: 95/57 mmHg (11/02 2152) Pulse Rate: 71 (11/02 2152)  Labs:  Recent Labs  02/04/15 1224 02/04/15 1630 02/04/15 1833 02/04/15 2125 02/05/15 0302  HGB 10.5* 9.4*  --   --  9.0*  HCT 31.0* 30.3*  --   --  28.6*  PLT  --  153  --   --  165  APTT  --   --  41*  --   --   LABPROT  --   --  13.8  --  14.6  INR  --   --  1.04  --  1.12  HEPARINUNFRC  --   --  <0.10*  --  0.34  CREATININE  --  5.01*  --   --   --   TROPONINI  --  0.03  --  0.03  --     Estimated Creatinine Clearance: 10.6 mL/min (by C-G formula based on Cr of 5.01).   Assessment: 74 y.o female on heparin bridge to warfarin for new onset Afib. Heparin level 0.34 (therapeutic) on 1100 units/hr. INR up slightly after first dose of warfarin given yesterday. Hgb trending down, plt ok. No bleeding noted.  Goal of Therapy:  Heparin level 0.3-0.7 units/ml Monitor platelets by anticoagulation protocol: Yes   Plan:  Continue heparin IV drip at rate of 1100 units/hr Coumadin 5 mg po again tonight Check heparin level in 6 hours to confirm therapeutic Daily heparin level, CBC, PT/INR Monitor for signs and symptoms of bleeding.   Thank you for allowing pharmacy to be part of this patients care team. Sherlon Handing, PharmD, BCPS Clinical pharmacist, pager [phone] 02/05/2015,3:55 AM

## 2015-02-05 NOTE — Progress Notes (Addendum)
Per renal, no Lovenox due to ESRD.  Will need to keep on IV Heparin until INR > 2.  Echo showed normal LVF with no wall motion abnormalities.  No further cardiac workup at this time.  We will have her followup with Cardiology in [place] and have her INRs followed there as well.  Will sign off.  Call with any questions.

## 2015-02-05 NOTE — Progress Notes (Signed)
[facility] PROGRESS NOTE  [name] [id number] DOB: February 27, 1941 DOA: 02/04/2015 PCP: Sherrie Mustache, MD  Assessment/Plan: New onset A-fib. Unclear chronicity; and most likely paroxysmal base on family/patient history -rate controlled currently -troponin neg, normal TSH and normal 2-D echo -per cardiology rec's will use anticoagulation therapy with coumadin and heparin  -CHADSVASC score 4  Left hand subclavian steal  S/p Left arm fistula removed by Dr. Bridgett Larsson on 11/02. Will follow rec's from vascular surgery   ESRD. Access via IJ in right chest.  Scheduled HD on T/T/Sat.  Renal service on board Next HD treatment anticipated on 11/05  Hypothyroidism. TSH WNL Will continue synthroid at current dose  Diabetes Diet controlled. Last Hgb A1C was 5.2 will monitor CBG; but doubt needs of any coverage   Recent UTI No further dysuria Recently completed antibiotic therapy   Normocytic Anemia Associated with renal failure epogen and IV iron as per renal service discretion   Code Status: Full Family Communication: daughter at bedside  Disposition Plan: continue inpatient treatment, on coumadin and heparin currently for A. fib   Consultants:  Cardiology  Vascular surgery  Renal service   Procedures:  2-De cho - Left ventricle: The cavity size was normal. Wall thickness was increased in a pattern of mild LVH. Systolic function was normal. The estimated ejection fraction was in the range of 55% to 60%. Wall motion was normal; there were no regional wall motion abnormalities. The study is not technically sufficient to allow evaluation of LV diastolic function. - Mitral valve: Calcified annulus. Mildly thickened leaflets . There was mild regurgitation. - Left atrium: Severely dilated at 49 ml/m2. - Right ventricle: The cavity size was mildly dilated. The moderator band was prominent. Systolic function was normal. - Right atrium: The  atrium was mildly dilated. - Tricuspid valve: There was mild regurgitation. - Pulmonary arteries: PA peak pressure: 24 mm Hg (S). - Inferior vena cava: The vessel was normal in size. The respirophasic diameter changes were in the normal range (>= 50%), consistent with normal central venous pressure.  Impressions: - LVEF 55-60%, mild LVH, normal wall motion, MAC with mild MR, severe LAE, mild RAE, mildly dilated RV with normal function, mild TR, normal RVSP.  Antibiotics:  None   HPI/Subjective: Feeling ok, complaining just of some pain in her LUE. No CP, no palpitations, no SOB.  Objective: Filed Vitals:   02/05/15 1810  BP: 112/81  Pulse: 80  Temp: 98.2 F (36.8 C)  Resp: 20    Intake/Output Summary (Last 24 hours) at 02/05/15 1827 Last data filed at 02/05/15 1810  Gross per 24 hour  Intake    240 ml  Output   3000 ml  Net  -2760 ml   Filed Weights   02/05/15 0736 02/05/15 1400 02/05/15 1810  Weight: 80.4 kg (177 lb 4 oz) 83.6 kg (184 lb 4.9 oz) 80.7 kg (177 lb 14.6 oz)    Exam:   General:  Feeling ok, no CP, no palpitations, no dizziness  Cardiovascular: irregular, but with controlled rate. No rubs or gallops  Respiratory: CTA bilaterally  Abdomen: soft, NT, ND, positive BS  Musculoskeletal: no edema; patient with LUE swelling/hematoma in the are where fistula was decanalize    Data Reviewed: Basic Metabolic Panel:  Recent Labs Lab 02/04/15 1224 02/04/15 1630 02/05/15 0345 02/05/15 1429  NA 138 141 140 135  K 4.7 4.6 4.7 3.9  CL  --  98* 97* 94*  CO2  --  32 33* 32  GLUCOSE 86 84 72 127*  BUN  --  29* 40* 42*  CREATININE  --  5.01* 5.93* 6.01*  CALCIUM  --  8.7* 8.7* 8.4*  PHOS  --  5.9*  --  5.3*   Liver Function Tests:  Recent Labs Lab 02/04/15 1630 02/05/15 1429  ALBUMIN 3.1* 2.8*   CBC:  Recent Labs Lab 02/04/15 1224 02/04/15 1630 02/05/15 0302 02/05/15 1430  WBC  --  5.4 6.3 4.9  HGB 10.5* 9.4* 9.0* 8.7*  HCT  31.0* 30.3* 28.6* 27.0*  MCV  --  98.1 97.6 96.4  PLT  --  153 165 151   Cardiac Enzymes:  Recent Labs Lab 02/04/15 1630 02/04/15 2125 02/05/15 0345  TROPONINI 0.03 0.03 0.03   CBG:  Recent Labs Lab 02/04/15 1151 02/04/15 1443 02/05/15 0656 02/05/15 1116  GLUCAP 66 95 81 84    Studies: No results found.  Scheduled Meds: . darbepoetin (ARANESP) injection - DIALYSIS  60 mcg Intravenous Q Thu-HD  . doxercalciferol  2 mcg Intravenous Q T,Th,Sa-HD  . ezetimibe  10 mg Oral Daily  . ferric gluconate (FERRLECIT/NULECIT) IV  125 mg Intravenous Q T,Th,Sa-HD  . gabapentin  100 mg Oral QHS  . levothyroxine  100 mcg Oral QAC breakfast  . midodrine  10 mg Oral Q T,Th,Sa-HD  . multivitamin  1 tablet Oral Daily  . sevelamer carbonate  1,600 mg Oral TID WC  . sodium chloride  3 mL Intravenous Q12H  . warfarin  5 mg Oral ONCE-1800  . warfarin   Does not apply Once  . Warfarin - Pharmacist Dosing Inpatient   Does not apply q1800   Continuous Infusions: . heparin 1,100 Units/hr (02/05/15 1708)    Principal Problem:   New onset a-fib (HCC) Active Problems:   End stage renal disease (HCC)   Steal syndrome dialysis vascular access ([place])   Diabetes mellitus type 2, diet-controlled ([place])   Hypothyroidism   High cholesterol   Time spent: 35 minutes   Barton Dubois  [facility] Pager [phone]. If 7PM-7AM, please contact night-coverage at [email or web address], password [facility] 02/05/2015, 6:27 PM  LOS: 1 day

## 2015-02-05 NOTE — Progress Notes (Signed)
  Echocardiogram 2D Echocardiogram has been performed.  [name] 02/05/2015, 10:06 AM

## 2015-02-05 NOTE — Progress Notes (Signed)
SUBJECTIVE:  No complaints  OBJECTIVE:   Vitals:   Filed Vitals:   02/04/15 1715 02/04/15 2152 02/05/15 0450 02/05/15 0736  BP: 123/63 95/57 98/60    Pulse: 79 71 78   Temp:  99 F (37.2 C) 98.1 F (36.7 C)   TempSrc:  Oral Oral   Resp: 20 18 20    Height:    5' 5.5" (1.664 m)  Weight:    177 lb 4 oz (80.4 kg)  SpO2: 99% 97% 98%    I&O's:   Intake/Output Summary (Last 24 hours) at 02/05/15 [id number] Last data filed at 02/04/15 2000  Gross per 24 hour  Intake    290 ml  Output     10 ml  Net    280 ml   TELEMETRY: Reviewed telemetry pt in atrial fibrillation with CVR:     PHYSICAL EXAM General: Well developed, well nourished, in no acute distress Head: Eyes PERRLA, No xanthomas.   Normal cephalic and atramatic  Lungs:   Clear bilaterally to auscultation and percussion. Heart:   Irregularly irregular S1 S2 Pulses are 2+ & equal. Abdomen: Bowel sounds are positive, abdomen soft and non-tender without masses \\Extremities :   No clubbing, cyanosis or edema.  DP +1 Neuro: Alert and oriented X 3. Psych:  Good affect, responds appropriately   LABS: Basic Metabolic Panel:  Recent Labs  02/04/15 1630 02/05/15 0345  NA 141 140  K 4.6 4.7  CL 98* 97*  CO2 32 33*  GLUCOSE 84 72  BUN 29* 40*  CREATININE 5.01* 5.93*  CALCIUM 8.7* 8.7*  PHOS 5.9*  --    Liver Function Tests:  Recent Labs  02/04/15 1630  ALBUMIN 3.1*   No results for input(s): LIPASE, AMYLASE in the last 72 hours. CBC:  Recent Labs  02/04/15 1630 02/05/15 0302  WBC 5.4 6.3  HGB 9.4* 9.0*  HCT 30.3* 28.6*  MCV 98.1 97.6  PLT 153 165   Cardiac Enzymes:  Recent Labs  02/04/15 1630 02/04/15 2125 02/05/15 0345  TROPONINI 0.03 0.03 0.03   BNP: Invalid input(s): POCBNP D-Dimer: No results for input(s): DDIMER in the last 72 hours. Hemoglobin A1C:  Recent Labs  02/04/15 1630  HGBA1C 5.0   Fasting Lipid Panel: No results for input(s): CHOL, HDL, LDLCALC, TRIG, CHOLHDL, LDLDIRECT in  the last 72 hours. Thyroid Function Tests:  Recent Labs  02/04/15 1833  TSH 0.929   Anemia Panel: No results for input(s): VITAMINB12, FOLATE, FERRITIN, TIBC, IRON, RETICCTPCT in the last 72 hours. Coag Panel:   Lab Results  Component Value Date   INR 1.12 02/05/2015   INR 1.04 02/04/2015   INR 1.03 04/21/2010    RADIOLOGY: No results found.  ASSESSMENT/PLAN: 1. New onset atrial fibrillation with CVR of unknown duration. She goes to HD 3 times weekly but there has not been any documentation of atrial fibrillation. I will check with her HD unit to see if there patients are on telemetry during HD. She is rate controlled so no need for BB or CCB especially since she has had problems with soft BP at HD in the past. TSH is normal.  2D echo is pending to assess LVF and LA size. Continue Heparin IV gtt per pharmacy and coumadin load. This patients CHA2DS2-VASc Score and unadjusted Ischemic Stroke Rate (% per year) is equal to 4 % stroke rate/year from a score of 4.  If echo ok and patient able to do home Lovenox, would consider Case Management consult to see  if insurance will pay for home lovenox while loading coumadin. 2. ESRD on HD s/p ligation of left brachiocephalic AVF due to severe steal syndrome in her left hand 3. HTN - controlled 4. DM 5. Hypothyroidism - TSH normal 6. Orthostatic hypotension on midodrine with HD   Sueanne Margarita, MD  02/05/2015  8:24 AM

## 2015-02-06 ENCOUNTER — Encounter (HOSPITAL_COMMUNITY)
Admission: RE | Disposition: A | Discharge status: Home / Self Care | Payer: Self-pay | Source: Ambulatory Visit | Attending: Internal Medicine

## 2015-02-06 ENCOUNTER — Inpatient Hospital Stay (HOSPITAL_COMMUNITY): Payer: Medicare Other | Admitting: Certified Registered Nurse Anesthetist

## 2015-02-06 ENCOUNTER — Encounter (HOSPITAL_COMMUNITY): Payer: Self-pay | Admitting: Certified Registered Nurse Anesthetist

## 2015-02-06 DIAGNOSIS — E785 Hyperlipidemia, unspecified: Secondary | ICD-10-CM

## 2015-02-06 DIAGNOSIS — I97621 Postprocedural hematoma of a circulatory system organ or structure following other procedure: Secondary | ICD-10-CM

## 2015-02-06 HISTORY — PX: THROMBECTOMY BRACHIAL ARTERY: SHX6649

## 2015-02-06 LAB — LIPID PANEL
Cholesterol: 155 mg/dL (ref 0–200)
HDL: 58 mg/dL (ref >40)
LDL Cholesterol: 75 mg/dL (ref 0–99)
Total CHOL/HDL Ratio: 2.7 RATIO
Triglycerides: 111 mg/dL (ref <150)
VLDL: 22 mg/dL (ref 0–40)

## 2015-02-06 LAB — CBC
HCT: 30.6 % — ABNORMAL LOW (ref 36.0–46.0)
Hemoglobin: 10 g/dL — ABNORMAL LOW (ref 12.0–15.0)
MCH: 31.9 pg (ref 26.0–34.0)
MCHC: 32.7 g/dL (ref 30.0–36.0)
MCV: 97.8 fL (ref 78.0–100.0)
Platelets: 161 10*3/uL (ref 150–400)
RBC: 3.13 MIL/uL — ABNORMAL LOW (ref 3.87–5.11)
RDW: 14.9 % (ref 11.5–15.5)
WBC: 7 10*3/uL (ref 4.0–10.5)

## 2015-02-06 LAB — POCT I-STAT 4, (NA,K, GLUC, HGB,HCT)
Glucose, Bld: 80 mg/dL (ref 65–99)
HCT: 30 % — ABNORMAL LOW (ref 36.0–46.0)
Hemoglobin: 10.2 g/dL — ABNORMAL LOW (ref 12.0–15.0)
Potassium: 4.3 mmol/L (ref 3.5–5.1)
Sodium: 134 mmol/L — ABNORMAL LOW (ref 135–145)

## 2015-02-06 LAB — PROTIME-INR
INR: 1.04 (ref 0.00–1.49)
Prothrombin Time: 13.8 seconds (ref 11.6–15.2)

## 2015-02-06 LAB — HEPARIN LEVEL (UNFRACTIONATED): Heparin Unfractionated: 0.28 IU/mL — ABNORMAL LOW (ref 0.30–0.70)

## 2015-02-06 SURGERY — THROMBECTOMY, ARTERY, BRACHIAL
Anesthesia: General | Site: Arm Upper | Laterality: Left

## 2015-02-06 MED ORDER — FENTANYL CITRATE (PF) 250 MCG/5ML IJ SOLN
INTRAMUSCULAR | Status: AC
  Filled 2015-02-06: qty 5

## 2015-02-06 MED ORDER — LIDOCAINE HCL (CARDIAC) 20 MG/ML IV SOLN
INTRAVENOUS | Status: AC
  Filled 2015-02-06: qty 5

## 2015-02-06 MED ORDER — FENTANYL CITRATE (PF) 100 MCG/2ML IJ SOLN: 25.0000 ug | INTRAMUSCULAR | Status: DC | PRN

## 2015-02-06 MED ORDER — FENTANYL CITRATE (PF) 100 MCG/2ML IJ SOLN
INTRAMUSCULAR | Status: DC | PRN
Start: 2015-02-06 — End: 2015-02-06
  Administered 2015-02-06: 25 ug via INTRAVENOUS

## 2015-02-06 MED ORDER — PHENYLEPHRINE 40 MCG/ML (10ML) SYRINGE FOR IV PUSH (FOR BLOOD PRESSURE SUPPORT)
PREFILLED_SYRINGE | INTRAVENOUS | Status: AC
  Filled 2015-02-06: qty 20

## 2015-02-06 MED ORDER — ONDANSETRON HCL 4 MG/2ML IJ SOLN
INTRAMUSCULAR | Status: DC | PRN
  Administered 2015-02-06: 4 mg via INTRAVENOUS

## 2015-02-06 MED ORDER — 0.9 % SODIUM CHLORIDE (POUR BTL) OPTIME
TOPICAL | Status: DC | PRN
  Administered 2015-02-06: 1000 mL

## 2015-02-06 MED ORDER — PHENYLEPHRINE HCL 10 MG/ML IJ SOLN
INTRAMUSCULAR | Status: DC | PRN
  Administered 2015-02-06 (×2): 80 ug via INTRAVENOUS
  Administered 2015-02-06: 120 ug via INTRAVENOUS
  Administered 2015-02-06: 80 ug via INTRAVENOUS

## 2015-02-06 MED ORDER — LIDOCAINE HCL (PF) 1 % IJ SOLN
INTRAMUSCULAR | Status: DC | PRN
  Administered 2015-02-06: 5 mL

## 2015-02-06 MED ORDER — ROCURONIUM BROMIDE 50 MG/5ML IV SOLN
INTRAVENOUS | Status: AC
  Filled 2015-02-06: qty 1

## 2015-02-06 MED ORDER — PROPOFOL 10 MG/ML IV BOLUS
INTRAVENOUS | Status: AC
  Filled 2015-02-06: qty 20

## 2015-02-06 MED ORDER — WARFARIN SODIUM 5 MG PO TABS: 5.0000 mg | ORAL_TABLET | Freq: Once | ORAL | Status: DC

## 2015-02-06 MED ORDER — LIDOCAINE HCL (CARDIAC) 20 MG/ML IV SOLN
INTRAVENOUS | Status: DC | PRN
  Administered 2015-02-06: 50 mg via INTRAVENOUS

## 2015-02-06 MED ORDER — THROMBIN 20000 UNITS EX SOLR
CUTANEOUS | Status: AC
  Filled 2015-02-06: qty 20000

## 2015-02-06 MED ORDER — LIDOCAINE HCL (PF) 1 % IJ SOLN
INTRAMUSCULAR | Status: AC
  Filled 2015-02-06: qty 30

## 2015-02-06 MED ORDER — HEPARIN (PORCINE) IN NACL 100-0.45 UNIT/ML-% IJ SOLN
1350.0000 [IU]/h | INTRAMUSCULAR | Status: DC
  Administered 2015-02-06: 1200 [IU]/h via INTRAVENOUS
  Administered 2015-02-07: 1350 [IU]/h via INTRAVENOUS
  Filled 2015-02-06 (×2): qty 250

## 2015-02-06 MED ORDER — PROPOFOL 10 MG/ML IV BOLUS
INTRAVENOUS | Status: DC | PRN
  Administered 2015-02-06: 150 mg via INTRAVENOUS

## 2015-02-06 MED ORDER — OXYCODONE-ACETAMINOPHEN 5-325 MG PO TABS
1.0000 | ORAL_TABLET | ORAL | Status: DC | PRN
  Administered 2015-02-07: 1 via ORAL
  Administered 2015-02-07: 2 via ORAL
  Administered 2015-02-08: 1 via ORAL
  Administered 2015-02-08: 2 via ORAL
  Administered 2015-02-08 – 2015-02-09 (×2): 1 via ORAL
  Filled 2015-02-06: qty 1
  Filled 2015-02-06: qty 2
  Filled 2015-02-06 (×3): qty 1
  Filled 2015-02-06: qty 2

## 2015-02-06 MED ORDER — THROMBIN 20000 UNITS EX SOLR
CUTANEOUS | Status: DC | PRN
  Administered 2015-02-06: 20 mL via TOPICAL

## 2015-02-06 MED ORDER — CEFAZOLIN SODIUM-DEXTROSE 2-3 GM-% IV SOLR
2.0000 g | INTRAVENOUS | Status: AC
  Administered 2015-02-06: 2 g via INTRAVENOUS

## 2015-02-06 SURGICAL SUPPLY — 36 items
BANDAGE ELASTIC 3 VELCRO ST LF (GAUZE/BANDAGES/DRESSINGS) ×2 IMPLANT
BANDAGE ELASTIC 4 VELCRO ST LF (GAUZE/BANDAGES/DRESSINGS) IMPLANT
BANDAGE ELASTIC 6 VELCRO ST LF (GAUZE/BANDAGES/DRESSINGS) IMPLANT
BNDG GAUZE ELAST 4 BULKY (GAUZE/BANDAGES/DRESSINGS) IMPLANT
CANISTER SUCTION 2500CC (MISCELLANEOUS) ×2 IMPLANT
CLIP TI MEDIUM 6 (CLIP) ×1 IMPLANT
CLIP TI WIDE RED SMALL 6 (CLIP) ×1 IMPLANT
COVER SURGICAL LIGHT HANDLE (MISCELLANEOUS) ×2 IMPLANT
DRAPE INCISE IOBAN 66X45 STRL (DRAPES) IMPLANT
DRAPE ORTHO SPLIT 77X108 STRL (DRAPES)
DRAPE PROXIMA HALF (DRAPES) ×2 IMPLANT
DRAPE SURG ORHT 6 SPLT 77X108 (DRAPES) IMPLANT
ELECT REM PT RETURN 9FT ADLT (ELECTROSURGICAL) ×2
ELECTRODE REM PT RTRN 9FT ADLT (ELECTROSURGICAL) ×1 IMPLANT
GAUZE SPONGE 4X4 12PLY STRL (GAUZE/BANDAGES/DRESSINGS) ×2 IMPLANT
GLOVE BIO SURGEON STRL SZ 6.5 (GLOVE) ×1 IMPLANT
GLOVE BIO SURGEON STRL SZ7.5 (GLOVE) ×2 IMPLANT
GLOVE BIOGEL PI IND STRL 6.5 (GLOVE) IMPLANT
GLOVE BIOGEL PI IND STRL 8 (GLOVE) ×1 IMPLANT
GLOVE BIOGEL PI INDICATOR 6.5 (GLOVE) ×3
GLOVE BIOGEL PI INDICATOR 8 (GLOVE) ×1
GOWN STRL REUS W/ TWL LRG LVL3 (GOWN DISPOSABLE) ×3 IMPLANT
GOWN STRL REUS W/TWL LRG LVL3 (GOWN DISPOSABLE) ×6
KIT BASIN OR (CUSTOM PROCEDURE TRAY) ×2 IMPLANT
KIT ROOM TURNOVER OR (KITS) ×2 IMPLANT
NS IRRIG 1000ML POUR BTL (IV SOLUTION) ×2 IMPLANT
PACK CV ACCESS (CUSTOM PROCEDURE TRAY) IMPLANT
PACK GENERAL/GYN (CUSTOM PROCEDURE TRAY) IMPLANT
PAD ARMBOARD 7.5X6 YLW CONV (MISCELLANEOUS) ×4 IMPLANT
SPONGE GAUZE 4X4 12PLY STER LF (GAUZE/BANDAGES/DRESSINGS) ×1 IMPLANT
SUT ETHILON 3 0 PS 1 (SUTURE) ×1 IMPLANT
SUT VIC AB 2-0 CTB1 (SUTURE) IMPLANT
SUT VIC AB 3-0 SH 27 (SUTURE) ×2
SUT VIC AB 3-0 SH 27X BRD (SUTURE) IMPLANT
SUT VICRYL 4-0 PS2 18IN ABS (SUTURE) ×1 IMPLANT
WATER STERILE IRR 1000ML POUR (IV SOLUTION) ×2 IMPLANT

## 2015-02-06 NOTE — Op Note (Signed)
    NAME: [name]    MRN: [id number] DOB: [birth year]    DATE OF OPERATION: 02/06/2015  PREOP DIAGNOSIS: hematoma status post ligation left brachiocephalic AV fistula  POSTOP DIAGNOSIS: same  PROCEDURE: evacuation hematoma left arm  SURGEON: Judeth Cornfield. Scot Dock, MD, FACS  ASSIST: none  ANESTHESIA: Gen.   EBL: minimal  INDICATIONS: [name] is a 74 y.o. female who underwent ligation of a left brachiocephalic AV fistula. Postoperatively she had atrial fibrillation and was started on heparin. She developed a hematoma in her left arm that appeared to be enlarging and therefore evacuation was recommended.  FINDINGS: There was generalized oozing but no specific bleeding site.  TECHNIQUE: The patient was taken to the operating room and received a general anesthetic. The left upper extremity was prepped and draped in usual sterile fashion. The previous incision at the antecubital level was opened. I did infiltrate with 1% lidocaine for postoperative pain control. There was generalized oozing present. This was cauterized. The fistula had been ligated and there was no bleeding from the artery or the fistula where it had been ligated and divided. The wound was irrigated with copious amounts of saline. I did use Gelfoam and thrombin also. Next the wound was closed with a deep layer of 3-0 Vicryl. The skin was closed with interrupted 3-0 nylon's. A sterile dressing was applied. The patient tolerated the procedure well and transferred to the recovery room in stable condition. All needle and sponge counts were correct.  Deitra Mayo, MD, FACS [facility]  DATE OF DICTATION:   02/06/2015

## 2015-02-06 NOTE — H&P (View-Only) (Signed)
   VASCULAR SURGERY ASSESSMENT & PLAN:  * Hematoma left arm is larger. Given that she is now on Coumadin I have recommended evacuation of the hematoma today and this has been added onto the schedule. I will stop her heparin at 9 AM.  SUBJECTIVE: No specific complaints.  PHYSICAL EXAM: Filed Vitals:   02/05/15 1730 02/05/15 1810 02/05/15 2250 02/06/15 0539  BP: 101/80 112/81 105/75 120/76  Pulse: 82 80 79 92  Temp:  98.2 F (36.8 C) 98.7 F (37.1 C) 98.4 F (36.9 C)  TempSrc:   Oral Oral  Resp:  20 18 18   Height:      Weight:  177 lb 14.6 oz (80.7 kg)  181 lb (82.1 kg)  SpO2:  99% 95% 97%   Hematoma left arm is larger. Incision is intact, however I am worried that the hematoma could compromise her skin.  LABS: Lab Results  Component Value Date   WBC 7.0 02/06/2015   HGB 10.0* 02/06/2015   HCT 30.6* 02/06/2015   MCV 97.8 02/06/2015   PLT 161 02/06/2015   Lab Results  Component Value Date   CREATININE 6.01* 02/05/2015   Lab Results  Component Value Date   INR 1.04 02/06/2015   CBG (last 3)   Recent Labs  02/04/15 1443 02/05/15 0656 02/05/15 1116  GLUCAP 95 81 84    Principal Problem:   New onset a-fib (HCC) Active Problems:   End stage renal disease (HCC)   Steal syndrome dialysis vascular access ([place])   Diabetes mellitus type 2, diet-controlled ([place])   Hypothyroidism   High cholesterol   Anemia of renal disease    Gae Gallop Beeper: [id number] 02/06/2015

## 2015-02-06 NOTE — Anesthesia Preprocedure Evaluation (Addendum)
Anesthesia Evaluation  Patient identified by MRN, date of birth, ID band Patient awake    Reviewed: Allergy & Precautions, H&P , NPO status , Patient's Chart, lab work & pertinent test results  Airway Mallampati: II  TM Distance: >3 FB Neck ROM: Full    Dental no notable dental hx. (+) Teeth Intact, Dental Advisory Given   Pulmonary neg pulmonary ROS, former smoker,    Pulmonary exam normal breath sounds clear to auscultation       Cardiovascular hypertension,  Rhythm:Regular Rate:Normal     Neuro/Psych negative neurological ROS  negative psych ROS   GI/Hepatic negative GI ROS, Neg liver ROS,   Endo/Other  diabetesHypothyroidism   Renal/GU ESRF and DialysisRenal disease  negative genitourinary   Musculoskeletal  (+) Arthritis , Osteoarthritis,    Abdominal   Peds  Hematology negative hematology ROS (+)   Anesthesia Other Findings   Reproductive/Obstetrics negative OB ROS                            Anesthesia Physical Anesthesia Plan  ASA: III  Anesthesia Plan: General   Post-op Pain Management:    Induction: Intravenous  Airway Management Planned: LMA  Additional Equipment:   Intra-op Plan:   Post-operative Plan: Extubation in OR  Informed Consent: I have reviewed the patients History and Physical, chart, labs and discussed the procedure including the risks, benefits and alternatives for the proposed anesthesia with the patient or authorized representative who has indicated his/her understanding and acceptance.   Dental advisory given  Plan Discussed with: CRNA  Anesthesia Plan Comments:         Anesthesia Quick Evaluation

## 2015-02-06 NOTE — Progress Notes (Signed)
ANTICOAGULATION CONSULT NOTE - Follow Up Consult  Pharmacy Consult for Heparin  Indication: atrial fibrillation, new onset  Allergies  Allergen Reactions  . Sulfa Drugs Cross Reactors Hives and Itching  . Amoxicillin Rash  . Percocet [Oxycodone-Acetaminophen] Itching and Rash    Did not happen last time she took it    Patient Measurements: Height: 5' 5.5" (166.4 cm) Weight: 177 lb 14.6 oz (80.7 kg) IBW/kg (Calculated) : 58.15 Heparin Dosing Weight: 76 kg  Vital Signs: Temp: 98.7 F (37.1 C) (11/03 2250) Temp Source: Oral (11/03 2250) BP: 105/75 mmHg (11/03 2250) Pulse Rate: 79 (11/03 2250)  Labs:  Recent Labs  02/04/15 1630  02/04/15 1833 02/04/15 2125 02/05/15 0302 02/05/15 0345 02/05/15 1012 02/05/15 1429 02/05/15 1430 02/06/15 0308  HGB 9.4*  --   --   --  9.0*  --   --   --  8.7* 10.0*  HCT 30.3*  --   --   --  28.6*  --   --   --  27.0* 30.6*  PLT 153  --   --   --  165  --   --   --  151 161  APTT  --   --  41*  --   --   --   --   --   --   --   LABPROT  --   --  13.8  --  14.6  --   --   --   --  13.8  INR  --   --  1.04  --  1.12  --   --   --   --  1.04  HEPARINUNFRC  --   < > <0.10*  --  0.34  --  0.39  --   --  0.28*  CREATININE 5.01*  --   --   --   --  5.93*  --  6.01*  --   --   TROPONINI 0.03  --   --  0.03  --  0.03  --   --   --   --   < > = values in this interval not displayed. Assessment:   Heparin and Coumadin begun 02/04/15 for new onset afib post-op ligation of left AVF due to steal syndrome.   Heparin level slightly sub-therapeutic (0.28) on 1100 units/hr.   INR 1.04 after 2 doses of Coumadin 5 mg on 11/2.   To avoid outpatient Lovenox due to ESRD and risk of accumulation and bleeding complications.   To stay on IV heparin until INR >2. RN reports no s/s of bleeding     Goal of Therapy:  INR 2-3 Heparin level 0.3-0.7 units/ml Monitor platelets by anticoagulation protocol: Yes   Plan:   Increase heparin drip to 1200 units/hr.  F/u  8 hr HL   Repeat Coumadin 5 mg. If no change in INR tomorrow, consider increasing dose   Daily heparin level, PT/INR and CBC.  Albertina Parr, PharmD., BCPS Clinical Pharmacist Pager [phone]

## 2015-02-06 NOTE — Care Management Important Message (Signed)
Important Message  Patient Details  Name: [name] MRN: [id number] Date of Birth: 10-Apr-1940   Medicare Important Message Given:  Yes-second notification given    Nathen May 02/06/2015, 10:29 AM

## 2015-02-06 NOTE — Anesthesia Procedure Notes (Signed)
Procedure Name: LMA Insertion Date/Time: 02/06/2015 1:12 PM Performed by: Shirlyn Goltz Pre-anesthesia Checklist: Patient identified, Emergency Drugs available, Suction available and Patient being monitored Patient Re-evaluated:Patient Re-evaluated prior to inductionOxygen Delivery Method: Circle system utilized Preoxygenation: Pre-oxygenation with 100% oxygen Intubation Type: IV induction Ventilation: Mask ventilation without difficulty LMA: LMA inserted LMA Size: 4.0 Number of attempts: 1 Placement Confirmation: positive ETCO2 and breath sounds checked- equal and bilateral Tube secured with: Tape Dental Injury: Teeth and Oropharynx as per pre-operative assessment

## 2015-02-06 NOTE — Progress Notes (Signed)
Call to Dr. Ermalene Postin, order rec'd to capture istat.

## 2015-02-06 NOTE — Progress Notes (Signed)
  [facility] Progress Note   Subjective: hematoma worse, to go to OR today  Filed Vitals:   02/05/15 1730 02/05/15 1810 02/05/15 2250 02/06/15 0539  BP: 101/80 112/81 105/75 120/76  Pulse: 82 80 79 92  Temp:  98.2 F (36.8 C) 98.7 F (37.1 C) 98.4 F (36.9 C)  TempSrc:   Oral Oral  Resp:  20 18 18   Height:      Weight:  80.7 kg (177 lb 14.6 oz)  82.1 kg (181 lb)  SpO2:  99% 95% 97%   Exam: Alert, up in chair No jvd Chest clear bilat Irreg irreg, no RG Abd soft ntnd no mass or ascites No LE edema L hand warm, L arm wrapped up Neuro nf, Ox 3 R IJ cath  NW TTS   4h   F160  350/800   81.5kg   2/2 bath  P4  R IJ cath  Hep 2400 hectorol 2 venofer 50/week  mircera 71 q 2 weeks start 11/3 recent labs: Hgb 9.5 35%sat ferrigin 364 iPTH 256 vit D 50.5 corr Ca 9.9 P 6.3   Assessment: 1 New onset afib - on IV hep, plan for coumadin 2 Steal syndrome s/p AVF ligation c/b hematoma- to OR today 3 ESRD TTS hd 4 Hypotension on midodrine 5 Anemia cont esa , IV Fe 6 MBD stop po vit D, cont Hect/ renvela 7 Hypothyroid on Rx 8 Vol is at dry wt   Plan - HD Sat, OR, heparin IV   Kelly Splinter MD [facility] pager [phone]    cell [phone] 02/06/2015, 11:53 AM    Recent Labs Lab 02/04/15 1630 02/05/15 0345 02/05/15 1429  NA 141 140 135  K 4.6 4.7 3.9  CL 98* 97* 94*  CO2 32 33* 32  GLUCOSE 84 72 127*  BUN 29* 40* 42*  CREATININE 5.01* 5.93* 6.01*  CALCIUM 8.7* 8.7* 8.4*  PHOS 5.9*  --  5.3*    Recent Labs Lab 02/04/15 1630 02/05/15 1429  ALBUMIN 3.1* 2.8*    Recent Labs Lab 02/05/15 0302 02/05/15 1430 02/06/15 0308  WBC 6.3 4.9 7.0  HGB 9.0* 8.7* 10.0*  HCT 28.6* 27.0* 30.6*  MCV 97.6 96.4 97.8  PLT 165 151 161   . [START ON 02/07/2015]  ceFAZolin (ANCEF) IV  2 g Intravenous To SS-Surg  . darbepoetin (ARANESP) injection - DIALYSIS  60 mcg Intravenous Q Thu-HD  . doxercalciferol  2 mcg Intravenous Q T,Th,Sa-HD  .  ezetimibe  10 mg Oral Daily  . ferric gluconate (FERRLECIT/NULECIT) IV  125 mg Intravenous Q T,Th,Sa-HD  . gabapentin  100 mg Oral QHS  . levothyroxine  100 mcg Oral QAC breakfast  . midodrine  10 mg Oral Q T,Th,Sa-HD  . multivitamin  1 tablet Oral Daily  . sevelamer carbonate  1,600 mg Oral TID WC  . sodium chloride  3 mL Intravenous Q12H  . warfarin  5 mg Oral ONCE-1800  . Warfarin - Pharmacist Dosing Inpatient   Does not apply q1800     sodium chloride, acetaminophen, ondansetron (ZOFRAN) IV, sodium chloride

## 2015-02-06 NOTE — Progress Notes (Signed)
   VASCULAR SURGERY ASSESSMENT & PLAN:  * Hematoma left arm is larger. Given that she is now on Coumadin I have recommended evacuation of the hematoma today and this has been added onto the schedule. I will stop her heparin at 9 AM.  SUBJECTIVE: No specific complaints.  PHYSICAL EXAM: Filed Vitals:   02/05/15 1730 02/05/15 1810 02/05/15 2250 02/06/15 0539  BP: 101/80 112/81 105/75 120/76  Pulse: 82 80 79 92  Temp:  98.2 F (36.8 C) 98.7 F (37.1 C) 98.4 F (36.9 C)  TempSrc:   Oral Oral  Resp:  20 18 18   Height:      Weight:  177 lb 14.6 oz (80.7 kg)  181 lb (82.1 kg)  SpO2:  99% 95% 97%   Hematoma left arm is larger. Incision is intact, however I am worried that the hematoma could compromise her skin.  LABS: Lab Results  Component Value Date   WBC 7.0 02/06/2015   HGB 10.0* 02/06/2015   HCT 30.6* 02/06/2015   MCV 97.8 02/06/2015   PLT 161 02/06/2015   Lab Results  Component Value Date   CREATININE 6.01* 02/05/2015   Lab Results  Component Value Date   INR 1.04 02/06/2015   CBG (last 3)   Recent Labs  02/04/15 1443 02/05/15 0656 02/05/15 1116  GLUCAP 95 81 84    Principal Problem:   New onset a-fib (HCC) Active Problems:   End stage renal disease (HCC)   Steal syndrome dialysis vascular access ([place])   Diabetes mellitus type 2, diet-controlled ([place])   Hypothyroidism   High cholesterol   Anemia of renal disease    Gae Gallop Beeper: [id number] 02/06/2015

## 2015-02-06 NOTE — Progress Notes (Addendum)
[facility] PROGRESS NOTE  [name] [id number] DOB: Jan 10, 1941 DOA: 02/04/2015 PCP: Sherrie Mustache, MD  Assessment/Plan: New onset A-fib. Unclear chronicity; and most likely paroxysmal base on family/patient history -rate controlled currently -troponin neg, normal TSH and normal 2-D echo -per cardiology rec's will use anticoagulation therapy with coumadin and heparin (treatment will be temporarily on hold while surgery for hematoma evacuation is done) -CHADSVASC score 4  Left hand subclavian steal  S/p Left arm fistula removed by Dr. Bridgett Larsson on 11/02. Now with hematoma in the same area; will require surgical evacuation  Plan is for surgery later this afternoon  Will follow rec's   ESRD. Access via IJ in right chest.  Scheduled HD on T/T/Sat.  Renal service on board Next HD treatment anticipated on 11/05  Hypothyroidism. TSH WNL Will continue synthroid at current dose  Diabetes Diet controlled. Last Hgb A1C was 5.2 will monitor CBG; but doubt needs of any coverage   Recent UTI No further dysuria Recently completed antibiotic therapy   Normocytic Anemia Associated with renal failure epogen and IV iron as per renal service discretion   Dyslipidemia -continue zetia -lipid profile ordered  Code Status: Full Family Communication: daughter at bedside  Disposition Plan: continue inpatient treatment, on coumadin and heparin currently for A. fib   Consultants:  Cardiology  Vascular surgery  Renal service   Procedures:  2-De cho - Left ventricle: The cavity size was normal. Wall thickness was increased in a pattern of mild LVH. Systolic function was normal. The estimated ejection fraction was in the range of 55% to 60%. Wall motion was normal; there were no regional wall motion abnormalities. The study is not technically sufficient to allow evaluation of LV diastolic function. - Mitral valve: Calcified annulus. Mildly thickened  leaflets . There was mild regurgitation. - Left atrium: Severely dilated at 49 ml/m2. - Right ventricle: The cavity size was mildly dilated. The moderator band was prominent. Systolic function was normal. - Right atrium: The atrium was mildly dilated. - Tricuspid valve: There was mild regurgitation. - Pulmonary arteries: PA peak pressure: 24 mm Hg (S). - Inferior vena cava: The vessel was normal in size. The respirophasic diameter changes were in the normal range (>= 50%), consistent with normal central venous pressure.  Impressions: - LVEF 55-60%, mild LVH, normal wall motion, MAC with mild MR, severe LAE, mild RAE, mildly dilated RV with normal function, mild TR, normal RVSP.  Antibiotics:  None   HPI/Subjective: No CP, no palpitations, no SOB. Reports some pain in her LUE. No cyanosis or tingling appreciated on exam of her left hand  Objective: Filed Vitals:   02/06/15 1537  BP: 145/96  Pulse: 79  Temp: 98.1 F (36.7 C)  Resp: 18    Intake/Output Summary (Last 24 hours) at 02/06/15 1629 Last data filed at 02/06/15 1417  Gross per 24 hour  Intake    203 ml  Output   3000 ml  Net  -2797 ml   Filed Weights   02/05/15 1400 02/05/15 1810 02/06/15 0539  Weight: 83.6 kg (184 lb 4.9 oz) 80.7 kg (177 lb 14.6 oz) 82.1 kg (181 lb)    Exam:   General:  Feeling ok, no CP, no palpitations, no dizziness. Complaining of pain in her LUE  Cardiovascular: irregular, but with controlled rate. No rubs or gallops.   Respiratory: CTA bilaterally  Abdomen: soft, NT, ND, positive BS  Musculoskeletal: no edema; patient with LUE swelling/hematoma in the area where fistula was decanalize; compression  dressing in place   Data Reviewed: Basic Metabolic Panel:  Recent Labs Lab 02/04/15 1224 02/04/15 1630 02/05/15 0345 02/05/15 1429 02/06/15 1253  NA 138 141 140 135 134*  K 4.7 4.6 4.7 3.9 4.3  CL  --  98* 97* 94*  --   CO2  --  32 33* 32  --   GLUCOSE 86 84 72  127* 80  BUN  --  29* 40* 42*  --   CREATININE  --  5.01* 5.93* 6.01*  --   CALCIUM  --  8.7* 8.7* 8.4*  --   PHOS  --  5.9*  --  5.3*  --    Liver Function Tests:  Recent Labs Lab 02/04/15 1630 02/05/15 1429  ALBUMIN 3.1* 2.8*   CBC:  Recent Labs Lab 02/04/15 1630 02/05/15 0302 02/05/15 1430 02/06/15 0308 02/06/15 1253  WBC 5.4 6.3 4.9 7.0  --   HGB 9.4* 9.0* 8.7* 10.0* 10.2*  HCT 30.3* 28.6* 27.0* 30.6* 30.0*  MCV 98.1 97.6 96.4 97.8  --   PLT 153 165 151 161  --    Cardiac Enzymes:  Recent Labs Lab 02/04/15 1630 02/04/15 2125 02/05/15 0345  TROPONINI 0.03 0.03 0.03   CBG:  Recent Labs Lab 02/04/15 1151 02/04/15 1443 02/05/15 0656 02/05/15 1116  GLUCAP 66 95 81 84    Studies: No results found.  Scheduled Meds: . darbepoetin (ARANESP) injection - DIALYSIS  60 mcg Intravenous Q Thu-HD  . doxercalciferol  2 mcg Intravenous Q T,Th,Sa-HD  . ezetimibe  10 mg Oral Daily  . ferric gluconate (FERRLECIT/NULECIT) IV  125 mg Intravenous Q T,Th,Sa-HD  . gabapentin  100 mg Oral QHS  . levothyroxine  100 mcg Oral QAC breakfast  . midodrine  10 mg Oral Q T,Th,Sa-HD  . multivitamin  1 tablet Oral Daily  . sevelamer carbonate  1,600 mg Oral TID WC  . sodium chloride  3 mL Intravenous Q12H  . Warfarin - Pharmacist Dosing Inpatient   Does not apply q1800   Continuous Infusions: . heparin      Principal Problem:   New onset a-fib (HCC) Active Problems:   End stage renal disease (HCC)   Steal syndrome dialysis vascular access ([place])   Diabetes mellitus type 2, diet-controlled ([place])   Hypothyroidism   High cholesterol   Anemia of renal disease   Time spent: 30 minutes   Barton Dubois  [facility] Pager [phone]. If 7PM-7AM, please contact night-coverage at [email or web address], password [facility] 02/06/2015, 4:29 PM  LOS: 2 days

## 2015-02-06 NOTE — Progress Notes (Signed)
UR Completed. Keddrick Wyne, RN, BSN.  [phone] 

## 2015-02-06 NOTE — Interval H&P Note (Signed)
History and Physical Interval Note:  02/06/2015 12:51 PM  [name]  has presented today for surgery, with the diagnosis of Evacuation of Hematoma  The various methods of treatment have been discussed with the patient and family. After consideration of risks, benefits and other options for treatment, the patient has consented to  Procedure(s): EVACUATION OF LEFT ARM HEMATOMA (Left) as a surgical intervention .  The patient's history has been reviewed, patient examined, no change in status, stable for surgery.  I have reviewed the patient's chart and labs.  Questions were answered to the patient's satisfaction.     Deitra Mayo

## 2015-02-06 NOTE — Transfer of Care (Signed)
Immediate Anesthesia Transfer of Care Note  Patient: [name]  Procedure(s) Performed: Procedure(s): EVACUATION OF LEFT ARM HEMATOMA (Left)  Patient Location: PACU  Anesthesia Type:General  Level of Consciousness: awake, alert , oriented and patient cooperative  Airway & Oxygen Therapy: Patient Spontanous Breathing and Patient connected to face mask oxygen  Post-op Assessment: Report given to RN, Post -op Vital signs reviewed and stable and Patient moving all extremities X 4  Post vital signs: Reviewed and stable  Last Vitals:  Filed Vitals:   02/06/15 0539  BP: 120/76  Pulse: 92  Temp: 36.9 C  Resp: 18    Complications: No apparent anesthesia complications

## 2015-02-06 NOTE — Progress Notes (Signed)
ANTICOAGULATION CONSULT NOTE - Follow Up Consult  Pharmacy Consult for Heparin Indication: new onset atrial fibrillation  Allergies  Allergen Reactions  . Sulfa Drugs Cross Reactors Hives and Itching  . Amoxicillin Rash  . Percocet [Oxycodone-Acetaminophen] Itching and Rash    Did not happen last time she took it    Patient Measurements: Height: 5' 5.5" (166.4 cm) Weight: 181 lb (82.1 kg) IBW/kg (Calculated) : 58.15 Heparin Dosing Weight: 76kg  Vital Signs: Temp: 98.1 F (36.7 C) (11/04 1537) Temp Source: Oral (11/04 1537) BP: 145/96 mmHg (11/04 1537) Pulse Rate: 79 (11/04 1537)  Labs:  Recent Labs  02/04/15 1630  02/04/15 1833 02/04/15 2125 02/05/15 0302 02/05/15 0345 02/05/15 1012 02/05/15 1429 02/05/15 1430 02/06/15 0308 02/06/15 1253  HGB 9.4*  --   --   --  9.0*  --   --   --  8.7* 10.0* 10.2*  HCT 30.3*  --   --   --  28.6*  --   --   --  27.0* 30.6* 30.0*  PLT 153  --   --   --  165  --   --   --  151 161  --   APTT  --   --  41*  --   --   --   --   --   --   --   --   LABPROT  --   --  13.8  --  14.6  --   --   --   --  13.8  --   INR  --   --  1.04  --  1.12  --   --   --   --  1.04  --   HEPARINUNFRC  --   < > <0.10*  --  0.34  --  0.39  --   --  0.28*  --   CREATININE 5.01*  --   --   --   --  5.93*  --  6.01*  --   --   --   TROPONINI 0.03  --   --  0.03  --  0.03  --   --   --   --   --   < > = values in this interval not displayed.  Estimated Creatinine Clearance: 8.8 mL/min (by C-G formula based on Cr of 6.01).  Assessment: 74yof s/p ligation of her left brachiocephalic AV fistula on [id number] developed new onset afib and was started on heparin and coumadin. On 11/3 she developed a hematoma at the incision site. Heparin and coumadin were held today and she underwent hematoma evacuation. Pharmacy has now received orders to resume heparin tonight at 2100 and resume coumadin tomorrow.  Heparin was previously subtherapeutic on 1100 units/hr and rate was  increased to 1200 units/hr but follow up level never drawn.   Goal of Therapy:  Heparin level 0.3-0.7 units/ml Monitor platelets by anticoagulation protocol: Yes   Plan:  1) At 2100 tonight, resume heparin at 1200 units/hr 2) Daily heparin level and CBC 3) Follow up resuming coumadin tomorrow 11/5  Deboraha Sprang 02/06/2015,4:08 PM

## 2015-02-06 NOTE — Anesthesia Postprocedure Evaluation (Signed)
  Anesthesia Post-op Note  Patient: [name]  Procedure(s) Performed: Procedure(s): EVACUATION OF LEFT ARM HEMATOMA (Left)  Patient Location: PACU  Anesthesia Type:General  Level of Consciousness: awake and alert   Airway and Oxygen Therapy: Patient Spontanous Breathing  Post-op Pain: Controlled  Post-op Assessment: Post-op Vital signs reviewed, Patient's Cardiovascular Status Stable and Respiratory Function Stable  Post-op Vital Signs: Reviewed  Filed Vitals:   02/06/15 1415  BP: 138/80  Pulse:   Temp: 36.6 C  Resp:     Complications: No apparent anesthesia complications

## 2015-02-06 NOTE — Progress Notes (Signed)
Lab- istat captured through saline lock, (wasted 48mls., then withdrew without any problem).

## 2015-02-07 LAB — HEPARIN LEVEL (UNFRACTIONATED)
Heparin Unfractionated: 0.23 IU/mL — ABNORMAL LOW (ref 0.30–0.70)
Heparin Unfractionated: 0.47 IU/mL (ref 0.30–0.70)
Heparin Unfractionated: 0.68 IU/mL (ref 0.30–0.70)

## 2015-02-07 LAB — PROTIME-INR
INR: 1.13 (ref 0.00–1.49)
Prothrombin Time: 14.7 seconds (ref 11.6–15.2)

## 2015-02-07 LAB — RENAL FUNCTION PANEL
Albumin: 2.9 g/dL — ABNORMAL LOW (ref 3.5–5.0)
Anion gap: 11 (ref 5–15)
BUN: 39 mg/dL — ABNORMAL HIGH (ref 6–20)
CO2: 26 mmol/L (ref 22–32)
Calcium: 8.9 mg/dL (ref 8.9–10.3)
Chloride: 96 mmol/L — ABNORMAL LOW (ref 101–111)
Creatinine, Ser: 6.4 mg/dL — ABNORMAL HIGH (ref 0.44–1.00)
GFR calc Af Amer: 7 mL/min — ABNORMAL LOW (ref >60)
GFR calc non Af Amer: 6 mL/min — ABNORMAL LOW (ref >60)
Glucose, Bld: 99 mg/dL (ref 65–99)
Phosphorus: 5.8 mg/dL — ABNORMAL HIGH (ref 2.5–4.6)
Potassium: 4.5 mmol/L (ref 3.5–5.1)
Sodium: 133 mmol/L — ABNORMAL LOW (ref 135–145)

## 2015-02-07 LAB — CBC
HCT: 29.6 % — ABNORMAL LOW (ref 36.0–46.0)
Hemoglobin: 9.3 g/dL — ABNORMAL LOW (ref 12.0–15.0)
MCH: 30.4 pg (ref 26.0–34.0)
MCHC: 31.4 g/dL (ref 30.0–36.0)
MCV: 96.7 fL (ref 78.0–100.0)
Platelets: 170 10*3/uL (ref 150–400)
RBC: 3.06 MIL/uL — ABNORMAL LOW (ref 3.87–5.11)
RDW: 14.9 % (ref 11.5–15.5)
WBC: 6.2 10*3/uL (ref 4.0–10.5)

## 2015-02-07 LAB — CBC WITH DIFFERENTIAL/PLATELET
Basophils Absolute: 0 10*3/uL (ref 0.0–0.1)
Basophils Relative: 1 %
Eosinophils Absolute: 0.7 10*3/uL (ref 0.0–0.7)
Eosinophils Relative: 12 %
HCT: 28.2 % — ABNORMAL LOW (ref 36.0–46.0)
Hemoglobin: 8.9 g/dL — ABNORMAL LOW (ref 12.0–15.0)
Lymphocytes Relative: 24 %
Lymphs Abs: 1.5 10*3/uL (ref 0.7–4.0)
MCH: 30.3 pg (ref 26.0–34.0)
MCHC: 31.6 g/dL (ref 30.0–36.0)
MCV: 95.9 fL (ref 78.0–100.0)
Monocytes Absolute: 0.6 10*3/uL (ref 0.1–1.0)
Monocytes Relative: 10 %
Neutro Abs: 3.4 10*3/uL (ref 1.7–7.7)
Neutrophils Relative %: 53 %
Platelets: 165 10*3/uL (ref 150–400)
RBC: 2.94 MIL/uL — ABNORMAL LOW (ref 3.87–5.11)
RDW: 14.8 % (ref 11.5–15.5)
WBC: 6.3 10*3/uL (ref 4.0–10.5)

## 2015-02-07 MED ORDER — DOXERCALCIFEROL 4 MCG/2ML IV SOLN
INTRAVENOUS | Status: AC
  Filled 2015-02-07: qty 2

## 2015-02-07 MED ORDER — DOCUSATE SODIUM 100 MG PO CAPS
100.0000 mg | ORAL_CAPSULE | Freq: Two times a day (BID) | ORAL | Status: DC
  Administered 2015-02-07 – 2015-02-09 (×5): 100 mg via ORAL
  Filled 2015-02-07 (×5): qty 1

## 2015-02-07 MED ORDER — WARFARIN SODIUM 7.5 MG PO TABS
7.5000 mg | ORAL_TABLET | Freq: Once | ORAL | Status: AC
  Administered 2015-02-07: 7.5 mg via ORAL
  Filled 2015-02-07: qty 1

## 2015-02-07 MED ORDER — POLYETHYLENE GLYCOL 3350 17 G PO PACK
17.0000 g | PACK | Freq: Every day | ORAL | Status: DC
  Administered 2015-02-07 – 2015-02-09 (×3): 17 g via ORAL
  Filled 2015-02-07 (×3): qty 1

## 2015-02-07 NOTE — Progress Notes (Signed)
[facility] PROGRESS NOTE  [name] [id number] DOB: 19-Apr-1940 DOA: 02/04/2015 PCP: Sherrie Mustache, MD  Assessment/Plan: New onset A-fib. Unclear chronicity; and most likely paroxysmal base on family/patient history -rate controlled currently -troponin neg, normal TSH and normal 2-D echo -will resume heparin and coumadin; follow INR (goal is 2-3) -CHADSVASC score 4  Left hand subclavian steal  S/p Left arm fistula removed by Dr. Bridgett Larsson on 11/02. Status post hematoma evacuation; doing ok and recovering appropriately  Will follow rec's from vascular surgery    ESRD. Access via IJ in right chest.  Scheduled HD on T/T/Sat.  Renal service on board Next HD treatment anticipated on 11/05  Hypothyroidism. TSH WNL Will continue synthroid at current dose  Diabetes Diet controlled. Last Hgb A1C was 5.2 will monitor CBG; but doubt needs of any coverage   Recent UTI No further dysuria Recently completed antibiotic therapy   Normocytic Anemia Associated with renal failure epogen and IV iron as per renal service discretion   Dyslipidemia -continue zetia -LDL in 70's range  Code Status: Full Family Communication: daughter at bedside  Disposition Plan: continue inpatient treatment, on coumadin and heparin currently for A. fib   Consultants:  Cardiology  Vascular surgery  Renal service   Procedures:  2-De cho - Left ventricle: The cavity size was normal. Wall thickness was increased in a pattern of mild LVH. Systolic function was normal. The estimated ejection fraction was in the range of 55% to 60%. Wall motion was normal; there were no regional wall motion abnormalities. The study is not technically sufficient to allow evaluation of LV diastolic function. - Mitral valve: Calcified annulus. Mildly thickened leaflets . There was mild regurgitation. - Left atrium: Severely dilated at 49 ml/m2. - Right ventricle: The cavity size was  mildly dilated. The moderator band was prominent. Systolic function was normal. - Right atrium: The atrium was mildly dilated. - Tricuspid valve: There was mild regurgitation. - Pulmonary arteries: PA peak pressure: 24 mm Hg (S). - Inferior vena cava: The vessel was normal in size. The respirophasic diameter changes were in the normal range (>= 50%), consistent with normal central venous pressure.  Impressions: - LVEF 55-60%, mild LVH, normal wall motion, MAC with mild MR, severe LAE, mild RAE, mildly dilated RV with normal function, mild TR, normal RVSP.  Antibiotics:  None   HPI/Subjective: No CP, no palpitations, no SOB. Reports some pain in her LUE; but overall well tolerated. No cyanosis or tingling appreciated/reported on her hand.  Objective: Filed Vitals:   02/07/15 0428  BP: 124/82  Pulse: 80  Temp: 98.3 F (36.8 C)  Resp: 20    Intake/Output Summary (Last 24 hours) at 02/07/15 1242 Last data filed at 02/07/15 1203  Gross per 24 hour  Intake    920 ml  Output      0 ml  Net    920 ml   Filed Weights   02/05/15 1810 02/06/15 0539 02/07/15 0428  Weight: 80.7 kg (177 lb 14.6 oz) 82.1 kg (181 lb) 82.4 kg (181 lb 10.5 oz)    Exam:   General:  Feeling ok, no CP, no palpitations, no dizziness. Reports some pain in her LUE, but well tolerated   Cardiovascular: irregular, but with controlled rate. No rubs or gallops.   Respiratory: CTA bilaterally  Abdomen: soft, NT, ND, positive BS  Musculoskeletal: no edema; patient with LUE swelling and compressive dressing in place  Data Reviewed: Basic Metabolic Panel:  Recent Labs Lab 02/04/15 1224  02/04/15 1630 02/05/15 0345 02/05/15 1429 02/06/15 1253  NA 138 141 140 135 134*  K 4.7 4.6 4.7 3.9 4.3  CL  --  98* 97* 94*  --   CO2  --  32 33* 32  --   GLUCOSE 86 84 72 127* 80  BUN  --  29* 40* 42*  --   CREATININE  --  5.01* 5.93* 6.01*  --   CALCIUM  --  8.7* 8.7* 8.4*  --   PHOS  --  5.9*  --   5.3*  --    Liver Function Tests:  Recent Labs Lab 02/04/15 1630 02/05/15 1429  ALBUMIN 3.1* 2.8*   CBC:  Recent Labs Lab 02/04/15 1630 02/05/15 0302 02/05/15 1430 02/06/15 0308 02/06/15 1253 02/07/15 0303  WBC 5.4 6.3 4.9 7.0  --  6.2  HGB 9.4* 9.0* 8.7* 10.0* 10.2* 9.3*  HCT 30.3* 28.6* 27.0* 30.6* 30.0* 29.6*  MCV 98.1 97.6 96.4 97.8  --  96.7  PLT 153 165 151 161  --  170   Cardiac Enzymes:  Recent Labs Lab 02/04/15 1630 02/04/15 2125 02/05/15 0345  TROPONINI 0.03 0.03 0.03   CBG:  Recent Labs Lab 02/04/15 1151 02/04/15 1443 02/05/15 0656 02/05/15 1116  GLUCAP 66 95 81 84    Studies: No results found.  Scheduled Meds: . darbepoetin (ARANESP) injection - DIALYSIS  60 mcg Intravenous Q Thu-HD  . docusate sodium  100 mg Oral BID  . doxercalciferol  2 mcg Intravenous Q T,Th,Sa-HD  . ezetimibe  10 mg Oral Daily  . ferric gluconate (FERRLECIT/NULECIT) IV  125 mg Intravenous Q T,Th,Sa-HD  . gabapentin  100 mg Oral QHS  . levothyroxine  100 mcg Oral QAC breakfast  . midodrine  10 mg Oral Q T,Th,Sa-HD  . multivitamin  1 tablet Oral Daily  . polyethylene glycol  17 g Oral Daily  . sevelamer carbonate  1,600 mg Oral TID WC  . sodium chloride  3 mL Intravenous Q12H  . Warfarin - Pharmacist Dosing Inpatient   Does not apply q1800   Continuous Infusions: . heparin 1,350 Units/hr (02/07/15 1147)    Principal Problem:   New onset a-fib ([place]) Active Problems:   End stage renal disease ([place])   Steal syndrome dialysis vascular access ([place])   Diabetes mellitus type 2, diet-controlled ([place])   Hypothyroidism   High cholesterol   Anemia of renal disease   Time spent: 30 minutes   Barton Dubois  [facility] Pager [phone]. If 7PM-7AM, please contact night-coverage at [email or web address], password [facility] 02/07/2015, 12:42 PM  LOS: 3 days

## 2015-02-07 NOTE — Progress Notes (Signed)
ANTICOAGULATION CONSULT NOTE - Follow Up Consult  Pharmacy Consult for Heparin & Coumadin Indication: new onset atrial fibrillation  Allergies  Allergen Reactions  . Sulfa Drugs Cross Reactors Hives and Itching  . Amoxicillin Rash  . Percocet [Oxycodone-Acetaminophen] Itching and Rash    Did not happen last time she took it    Patient Measurements: Height: 5' 5.5" (166.4 cm) Weight: 185 lb 6.5 oz (84.1 kg) IBW/kg (Calculated) : 58.15 Heparin Dosing Weight: 76kg  Vital Signs: Temp: 98.8 F (37.1 C) (11/05 1235) Temp Source: Oral (11/05 1235) BP: 83/63 mmHg (11/05 1430) Pulse Rate: 91 (11/05 1430)  Labs:  Recent Labs  02/04/15 1630  02/04/15 1833 02/04/15 2125 02/05/15 0302 02/05/15 0345  02/05/15 1429  02/06/15 0308 02/06/15 1253 02/07/15 0303 02/07/15 1306 02/07/15 1432  HGB 9.4*  --   --   --  9.0*  --   --   --   < > 10.0* 10.2* 9.3* 8.9*  --   HCT 30.3*  --   --   --  28.6*  --   --   --   < > 30.6* 30.0* 29.6* 28.2*  --   PLT 153  --   --   --  165  --   --   --   < > 161  --  170 165  --   APTT  --   --  41*  --   --   --   --   --   --   --   --   --   --   --   LABPROT  --   < > 13.8  --  14.6  --   --   --   --  13.8  --  14.7  --   --   INR  --   < > 1.04  --  1.12  --   --   --   --  1.04  --  1.13  --   --   HEPARINUNFRC  --   < > <0.10*  --  0.34  --   < >  --   --  0.28*  --  0.23*  --  0.47  CREATININE 5.01*  --   --   --   --  5.93*  --  6.01*  --   --   --   --  6.40*  --   TROPONINI 0.03  --   --  0.03  --  0.03  --   --   --   --   --   --   --   --   < > = values in this interval not displayed.  Estimated Creatinine Clearance: 8.4 mL/min (by C-G formula based on Cr of 6.4).  Assessment: 74yof s/p ligation of her left brachiocephalic AV fistula on [id number] developed new onset afib and was started on heparin and coumadin. On 11/3 she developed a hematoma at the incision site. Heparin and coumadin were held on 11/4 and she underwent hematoma  evacuation. Heparin and coumadin then resumed. Last HL was therapeutic at 0.47 after dose increase. INR this am is 1.13. Did not receive yesterdays coumadin dose per Dr. Scot Dock. Plan is to stay until INR > 2.  Goal of Therapy:  Heparin level 0.3-0.7 units/ml Monitor platelets by anticoagulation protocol: Yes   Plan:  Continue heparin drip at 1,350 units/hr Give Coumadin 7.5mg  PO x 1 tonight Check 8 hr confirmatory HL  Monitor daily INR, HL, CBC, s/s of bleed  Elenor Quinones, PharmD Clinical Pharmacist Pager [phone] 02/07/2015 3:10 PM

## 2015-02-07 NOTE — Progress Notes (Signed)
ANTICOAGULATION CONSULT NOTE - Follow Up Consult  Pharmacy Consult for Heparin Indication: new onset atrial fibrillation  Allergies  Allergen Reactions  . Sulfa Drugs Cross Reactors Hives and Itching  . Amoxicillin Rash  . Percocet [Oxycodone-Acetaminophen] Itching and Rash    Did not happen last time she took it    Patient Measurements: Height: 5' 5.5" (166.4 cm) Weight: 181 lb 10.5 oz (82.4 kg) IBW/kg (Calculated) : 58.15 Heparin Dosing Weight: 76kg  Vital Signs: Temp: 98.3 F (36.8 C) (11/05 0428) Temp Source: Oral (11/05 0428) BP: 124/82 mmHg (11/05 0428) Pulse Rate: 80 (11/05 0428)  Labs:  Recent Labs  02/04/15 1630  02/04/15 1833 02/04/15 2125 02/05/15 0302 02/05/15 0345 02/05/15 1012 02/05/15 1429 02/05/15 1430 02/06/15 0308 02/06/15 1253 02/07/15 0303  HGB 9.4*  --   --   --  9.0*  --   --   --  8.7* 10.0* 10.2* 9.3*  HCT 30.3*  --   --   --  28.6*  --   --   --  27.0* 30.6* 30.0* 29.6*  PLT 153  --   --   --  165  --   --   --  151 161  --  170  APTT  --   --  41*  --   --   --   --   --   --   --   --   --   LABPROT  --   < > 13.8  --  14.6  --   --   --   --  13.8  --  14.7  INR  --   < > 1.04  --  1.12  --   --   --   --  1.04  --  1.13  HEPARINUNFRC  --   < > <0.10*  --  0.34  --  0.39  --   --  0.28*  --  0.23*  CREATININE 5.01*  --   --   --   --  5.93*  --  6.01*  --   --   --   --   TROPONINI 0.03  --   --  0.03  --  0.03  --   --   --   --   --   --   < > = values in this interval not displayed.  Estimated Creatinine Clearance: 8.8 mL/min (by C-G formula based on Cr of 6.01).  Assessment: 74yof s/p ligation of her left brachiocephalic AV fistula on [id number] developed new onset afib and was started on heparin and coumadin. On 11/3 she developed a hematoma at the incision site. Heparin and coumadin were held on 11/4 and she underwent hematoma evacuation. Heparin was then resumed last night and Coumadin to resume today  HL this AM is sub-therapeutic  at 0.23 on 1200 units/hr. H/H down to 9.3/29.6. Plt 170k  Goal of Therapy:  Heparin level 0.3-0.7 units/ml Monitor platelets by anticoagulation protocol: Yes   Plan:  1) Increase heparin infusion to 1350 units/hr 2) Daily heparin level and CBC 3) Follow up resuming coumadin tomorrow 11/5  Albertina Parr, PharmD., BCPS Clinical Pharmacist

## 2015-02-07 NOTE — Progress Notes (Signed)
Pt returned from dialysis. V/S charted, pt assessed, pt expressed pain 10/10 so pain medication administered. Pt in chair, eating dinner, comfortable, call bell within reach.   Fritz Pickerel, RN

## 2015-02-07 NOTE — Progress Notes (Addendum)
[facility]  Subjective  - Doing well over all.  Painful bu tolerable.   Objective 124/82 80 98.3 F (36.8 C) (Oral) 20 100%  Intake/Output Summary (Last 24 hours) at 02/07/15 0729 Last data filed at 02/06/15 1843  Gross per 24 hour  Intake    443 ml  Output      0 ml  Net    443 ml    Left forearm dressing in place clean and dry Minimal-moderate  edema in left arm and hand.  Sensation intact and active range of motion with palpable pulse on the left UE.  Assessment/Planning: POD #1 evacuation hematoma left arm s/p ligation left brachiocephalic AV fistula  We will take down the dressing tomorrow to inspect the incision She was instructed to elevate her left hand above her elbow and move her fingers to help decrease the edema.    Laurence Slate [facility] 02/07/2015 7:29 AM --  Laboratory Lab Results:  Recent Labs  02/06/15 0308 02/06/15 1253 02/07/15 0303  WBC 7.0  --  6.2  HGB 10.0* 10.2* 9.3*  HCT 30.6* 30.0* 29.6*  PLT 161  --  170   BMET  Recent Labs  02/05/15 0345 02/05/15 1429 02/06/15 1253  NA 140 135 134*  K 4.7 3.9 4.3  CL 97* 94*  --   CO2 33* 32  --   GLUCOSE 72 127* 80  BUN 40* 42*  --   CREATININE 5.93* 6.01*  --   CALCIUM 8.7* 8.4*  --     COAG Lab Results  Component Value Date   INR 1.13 02/07/2015   INR 1.04 02/06/2015   INR 1.12 02/05/2015   No results found for: PTT    I have examined the patient, reviewed and agree with above.  Curt Jews, MD 02/07/2015 9:11 AM

## 2015-02-07 NOTE — Progress Notes (Signed)
  [facility] Progress Note   Subjective: stable on HD  Filed Vitals:   02/07/15 0428 02/07/15 1235 02/07/15 1243 02/07/15 1245  BP: 124/82 106/73 106/73 112/71  Pulse: 80 69 70 73  Temp: 98.3 F (36.8 C) 98.8 F (37.1 C)    TempSrc: Oral Oral    Resp: 20 20    Height:      Weight: 82.4 kg (181 lb 10.5 oz) 84.1 kg (185 lb 6.5 oz)    SpO2: 100% 100%     Exam: Alert, up in chair No jvd Chest clear bilat Irreg irreg, no RG Abd soft ntnd no mass or ascites No LE edema L hand warm, L arm wrapped up Neuro nf, Ox 3 R IJ cath  NW TTS   4h   F160  350/800   81.5kg   2/2 bath  P4  R IJ cath  Hep 2400 hectorol 2 venofer 50/week  mircera 71 q 2 weeks start 11/3 recent labs: Hgb 9.5 35%sat ferrigin 364 iPTH 256 vit D 50.5 corr Ca 9.9 P 6.3   Assessment: 1 New onset afib - on IV hep, plan for coumadin 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  3 L arm post-op hematoma - s/p evacuation 11/4 3 ESRD TTS hd 4 Hypotension on midodrine 5 Anemia cont esa , IV Fe 6 MBD stop po vit D, cont Hect/ renvela 7 Hypothyroid on Rx 8 Vol up 3kg   Plan - HD today, UF to dry wt   Kelly Splinter MD [facility] pager [phone]    cell [phone] 02/07/2015, 1:00 PM    Recent Labs Lab 02/04/15 1630 02/05/15 0345 02/05/15 1429 02/06/15 1253  NA 141 140 135 134*  K 4.6 4.7 3.9 4.3  CL 98* 97* 94*  --   CO2 32 33* 32  --   GLUCOSE 84 72 127* 80  BUN 29* 40* 42*  --   CREATININE 5.01* 5.93* 6.01*  --   CALCIUM 8.7* 8.7* 8.4*  --   PHOS 5.9*  --  5.3*  --     Recent Labs Lab 02/04/15 1630 02/05/15 1429  ALBUMIN 3.1* 2.8*    Recent Labs Lab 02/05/15 1430 02/06/15 0308 02/06/15 1253 02/07/15 0303  WBC 4.9 7.0  --  6.2  HGB 8.7* 10.0* 10.2* 9.3*  HCT 27.0* 30.6* 30.0* 29.6*  MCV 96.4 97.8  --  96.7  PLT 151 161  --  170   . darbepoetin (ARANESP) injection - DIALYSIS  60 mcg Intravenous Q Thu-HD  . docusate sodium  100 mg Oral BID  .  doxercalciferol  2 mcg Intravenous Q T,Th,Sa-HD  . ezetimibe  10 mg Oral Daily  . ferric gluconate (FERRLECIT/NULECIT) IV  125 mg Intravenous Q T,Th,Sa-HD  . gabapentin  100 mg Oral QHS  . levothyroxine  100 mcg Oral QAC breakfast  . midodrine  10 mg Oral Q T,Th,Sa-HD  . multivitamin  1 tablet Oral Daily  . polyethylene glycol  17 g Oral Daily  . sevelamer carbonate  1,600 mg Oral TID WC  . sodium chloride  3 mL Intravenous Q12H  . Warfarin - Pharmacist Dosing Inpatient   Does not apply q1800   . heparin 1,350 Units/hr (02/07/15 1147)   sodium chloride, acetaminophen, ondansetron (ZOFRAN) IV, oxyCODONE-acetaminophen, sodium chloride

## 2015-02-07 NOTE — Discharge Instructions (Signed)

## 2015-02-08 LAB — CBC
HCT: 26.7 % — ABNORMAL LOW (ref 36.0–46.0)
Hemoglobin: 8.5 g/dL — ABNORMAL LOW (ref 12.0–15.0)
MCH: 30.8 pg (ref 26.0–34.0)
MCHC: 31.8 g/dL (ref 30.0–36.0)
MCV: 96.7 fL (ref 78.0–100.0)
Platelets: 168 10*3/uL (ref 150–400)
RBC: 2.76 MIL/uL — ABNORMAL LOW (ref 3.87–5.11)
RDW: 14.9 % (ref 11.5–15.5)
WBC: 6.2 10*3/uL (ref 4.0–10.5)

## 2015-02-08 LAB — HEPARIN LEVEL (UNFRACTIONATED): Heparin Unfractionated: 0.44 IU/mL (ref 0.30–0.70)

## 2015-02-08 LAB — PROTIME-INR
INR: 1.26 (ref 0.00–1.49)
Prothrombin Time: 15.9 seconds — ABNORMAL HIGH (ref 11.6–15.2)

## 2015-02-08 LAB — GLUCOSE, CAPILLARY: Glucose-Capillary: 84 mg/dL (ref 65–99)

## 2015-02-08 MED ORDER — WARFARIN SODIUM 7.5 MG PO TABS
7.5000 mg | ORAL_TABLET | Freq: Once | ORAL | Status: AC
  Administered 2015-02-08: 7.5 mg via ORAL
  Filled 2015-02-08: qty 1

## 2015-02-08 NOTE — Progress Notes (Signed)
[facility] PROGRESS NOTE  [name] [id number] DOB: 05/21/1940 DOA: 02/04/2015 PCP: Sherrie Mustache, MD  Assessment/Plan: New onset A-fib. Unclear chronicity; and most likely paroxysmal base on family/patient history -rate controlled currently -troponin neg, normal TSH and normal 2-D echo -will continue coumadin; heparin on hold today due to bleeding from area; pharmacy helping with dosage (goal is 2-3) -CHADSVASC score 4  Left hand subclavian steal  S/p Left arm fistula removed by Dr. Bridgett Larsson on 11/02. Status post hematoma evacuation; doing ok and recovering appropriately  Will follow rec's from vascular surgery    ESRD. Access via IJ in right chest.  Scheduled HD on T/T/Sat.  Renal service on board Next HD treatment anticipated on 11/05  Hypothyroidism. TSH WNL Will continue synthroid at current dose  Diabetes Diet controlled. Last Hgb A1C was 5.2 will monitor CBG; but doubt needs of any coverage   Recent UTI No further dysuria Recently completed antibiotic therapy   Normocytic Anemia Associated with renal failure epogen and IV iron as per renal service discretion   Dyslipidemia -continue zetia -LDL in 70's range  Code Status: Full Family Communication: daughter at bedside  Disposition Plan: continue inpatient treatment, on coumadin and heparin currently for A. fib   Consultants:  Cardiology  Vascular surgery  Renal service   Procedures:  2-De cho - Left ventricle: The cavity size was normal. Wall thickness was increased in a pattern of mild LVH. Systolic function was normal. The estimated ejection fraction was in the range of 55% to 60%. Wall motion was normal; there were no regional wall motion abnormalities. The study is not technically sufficient to allow evaluation of LV diastolic function. - Mitral valve: Calcified annulus. Mildly thickened leaflets . There was mild regurgitation. - Left atrium: Severely dilated  at 49 ml/m2. - Right ventricle: The cavity size was mildly dilated. The moderator band was prominent. Systolic function was normal. - Right atrium: The atrium was mildly dilated. - Tricuspid valve: There was mild regurgitation. - Pulmonary arteries: PA peak pressure: 24 mm Hg (S). - Inferior vena cava: The vessel was normal in size. The respirophasic diameter changes were in the normal range (>= 50%), consistent with normal central venous pressure.  Impressions: - LVEF 55-60%, mild LVH, normal wall motion, MAC with mild MR, severe LAE, mild RAE, mildly dilated RV with normal function, mild TR, normal RVSP.  Antibiotics:  None   HPI/Subjective: No CP, no palpitations, no SOB. Reports pain and swelling on her LUE. Patient with bleeding on surgical area.  Objective: Filed Vitals:   02/08/15 0614  BP: 116/74  Pulse: 63  Temp: 98 F (36.7 C)  Resp: 18    Intake/Output Summary (Last 24 hours) at 02/08/15 1317 Last data filed at 02/08/15 0802  Gross per 24 hour  Intake    180 ml  Output   -243 ml  Net    423 ml   Filed Weights   02/07/15 1235 02/07/15 1621 02/08/15 0321  Weight: 84.1 kg (185 lb 6.5 oz) 84.1 kg (185 lb 6.5 oz) 84.505 kg (186 lb 4.8 oz)    Exam:   General:  Feeling ok, no CP, no palpitations, no dizziness. Reports some pain in her LUE and reports some bleeding in surgical area. LUE is swollen  Cardiovascular: irregular, but with controlled rate. No rubs or gallops.   Respiratory: CTA bilaterally  Abdomen: soft, NT, ND, positive BS  Musculoskeletal: no edema on LE bilaterally; patient with LUE swelling and compressive dressing in place  Data Reviewed: Basic Metabolic Panel:  Recent Labs Lab 02/04/15 1630 02/05/15 0345 02/05/15 1429 02/06/15 1253 02/07/15 1306  NA 141 140 135 134* 133*  K 4.6 4.7 3.9 4.3 4.5  CL 98* 97* 94*  --  96*  CO2 32 33* 32  --  26  GLUCOSE 84 72 127* 80 99  BUN 29* 40* 42*  --  39*  CREATININE 5.01*  5.93* 6.01*  --  6.40*  CALCIUM 8.7* 8.7* 8.4*  --  8.9  PHOS 5.9*  --  5.3*  --  5.8*   Liver Function Tests:  Recent Labs Lab 02/04/15 1630 02/05/15 1429 02/07/15 1306  ALBUMIN 3.1* 2.8* 2.9*   CBC:  Recent Labs Lab 02/05/15 1430 02/06/15 0308 02/06/15 1253 02/07/15 0303 02/07/15 1306 02/08/15 0155  WBC 4.9 7.0  --  6.2 6.3 6.2  NEUTROABS  --   --   --   --  3.4  --   HGB 8.7* 10.0* 10.2* 9.3* 8.9* 8.5*  HCT 27.0* 30.6* 30.0* 29.6* 28.2* 26.7*  MCV 96.4 97.8  --  96.7 95.9 96.7  PLT 151 161  --  170 165 168   Cardiac Enzymes:  Recent Labs Lab 02/04/15 1630 02/04/15 2125 02/05/15 0345  TROPONINI 0.03 0.03 0.03   CBG:  Recent Labs Lab 02/04/15 1151 02/04/15 1443 02/05/15 0656 02/05/15 1116 02/08/15 0616  GLUCAP 66 95 81 84 84    Studies: No results found.  Scheduled Meds: . darbepoetin (ARANESP) injection - DIALYSIS  60 mcg Intravenous Q Thu-HD  . docusate sodium  100 mg Oral BID  . doxercalciferol  2 mcg Intravenous Q T,Th,Sa-HD  . ezetimibe  10 mg Oral Daily  . ferric gluconate (FERRLECIT/NULECIT) IV  125 mg Intravenous Q T,Th,Sa-HD  . gabapentin  100 mg Oral QHS  . levothyroxine  100 mcg Oral QAC breakfast  . midodrine  10 mg Oral Q T,Th,Sa-HD  . multivitamin  1 tablet Oral Daily  . polyethylene glycol  17 g Oral Daily  . sevelamer carbonate  1,600 mg Oral TID WC  . sodium chloride  3 mL Intravenous Q12H  . warfarin  7.5 mg Oral ONCE-1800  . Warfarin - Pharmacist Dosing Inpatient   Does not apply q1800   Continuous Infusions:    Principal Problem:   New onset a-fib ([place]) Active Problems:   End stage renal disease ([facility])   Steal syndrome dialysis vascular access ([place])   Diabetes mellitus type 2, diet-controlled ([place])   Hypothyroidism   High cholesterol   Anemia of renal disease   Time spent: 30 minutes   Barton Dubois  [facility] Pager [phone]. If 7PM-7AM, please contact night-coverage at [email or web address], password  [facility] 02/08/2015, 1:17 PM  LOS: 4 days

## 2015-02-08 NOTE — Progress Notes (Signed)
  [facility] Progress Note   Subjective: had 2 bleeding episodes from arm last night, IV hep was stopped  Filed Vitals:   02/07/15 2143 02/08/15 0321 02/08/15 0614 02/08/15 1345  BP: 103/62  116/74 116/63  Pulse: 76  63 78  Temp: 98.6 F (37 C)  98 F (36.7 C) 98.2 F (36.8 C)  TempSrc: Oral  Oral Oral  Resp: 18  18 18   Height:      Weight:  84.505 kg (186 lb 4.8 oz)    SpO2: 96%  98% 99%   Exam: Alert, up in chair No jvd Chest clear bilat Irreg irreg, no RG Abd soft ntnd no mass or ascites No LE edema L hand warm, L arm wrapped up Neuro nf, Ox 3 R IJ cath  NW TTS   4h   F160  350/800   81.5kg   2/2 bath  P4  R IJ cath  Hep 2400 hectorol 2 venofer 50/week  mircera 71 q 2 weeks start 11/3 recent labs: Hgb 9.5 35%sat ferrigin 364 iPTH 256 vit D 50.5 corr Ca 9.9 P 6.3   Assessment: 1 New onset afib - on coumadin, awaiting Rx INR. 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  3 L arm post-op hematoma - s/p evacuation 11/4 3 ESRD TTS hd 4 Hypotension on midodrine 5 Anemia cont esa , IV Fe 6 MBD stop po vit D, cont Hect/ renvela 7 Hypothyroid on Rx 8 Vol up 3kg   Plan - next HD Tuesday, awaiting therapeutic INR   Kelly Splinter MD [facility] pager [phone]    cell [phone] 02/08/2015, 3:52 PM    Recent Labs Lab 02/04/15 1630 02/05/15 0345 02/05/15 1429 02/06/15 1253 02/07/15 1306  NA 141 140 135 134* 133*  K 4.6 4.7 3.9 4.3 4.5  CL 98* 97* 94*  --  96*  CO2 32 33* 32  --  26  GLUCOSE 84 72 127* 80 99  BUN 29* 40* 42*  --  39*  CREATININE 5.01* 5.93* 6.01*  --  6.40*  CALCIUM 8.7* 8.7* 8.4*  --  8.9  PHOS 5.9*  --  5.3*  --  5.8*    Recent Labs Lab 02/04/15 1630 02/05/15 1429 02/07/15 1306  ALBUMIN 3.1* 2.8* 2.9*    Recent Labs Lab 02/07/15 0303 02/07/15 1306 02/08/15 0155  WBC 6.2 6.3 6.2  NEUTROABS  --  3.4  --   HGB 9.3* 8.9* 8.5*  HCT 29.6* 28.2* 26.7*  MCV 96.7  95.9 96.7  PLT 170 165 168   . darbepoetin (ARANESP) injection - DIALYSIS  60 mcg Intravenous Q Thu-HD  . docusate sodium  100 mg Oral BID  . doxercalciferol  2 mcg Intravenous Q T,Th,Sa-HD  . ezetimibe  10 mg Oral Daily  . ferric gluconate (FERRLECIT/NULECIT) IV  125 mg Intravenous Q T,Th,Sa-HD  . gabapentin  100 mg Oral QHS  . levothyroxine  100 mcg Oral QAC breakfast  . midodrine  10 mg Oral Q T,Th,Sa-HD  . multivitamin  1 tablet Oral Daily  . polyethylene glycol  17 g Oral Daily  . sevelamer carbonate  1,600 mg Oral TID WC  . sodium chloride  3 mL Intravenous Q12H  . warfarin  7.5 mg Oral ONCE-1800  . Warfarin - Pharmacist Dosing Inpatient   Does not apply q1800     sodium chloride, acetaminophen, ondansetron (ZOFRAN) IV, oxyCODONE-acetaminophen, sodium chloride

## 2015-02-08 NOTE — Progress Notes (Addendum)
ANTICOAGULATION CONSULT NOTE - Follow Up Consult  Pharmacy Consult for Heparin Indication: new onset atrial fibrillation  Allergies  Allergen Reactions  . Sulfa Drugs Cross Reactors Hives and Itching  . Amoxicillin Rash  . Percocet [Oxycodone-Acetaminophen] Itching and Rash    Did not happen last time she took it    Patient Measurements: Height: 5' 5.5" (166.4 cm) Weight: 185 lb 6.5 oz (84.1 kg) IBW/kg (Calculated) : 58.15 Heparin Dosing Weight: 76kg  Vital Signs: Temp: 98.6 F (37 C) (11/05 2143) Temp Source: Oral (11/05 2143) BP: 103/62 mmHg (11/05 2143) Pulse Rate: 76 (11/05 2143)  Labs:  Recent Labs  02/05/15 0302 02/05/15 0345  02/05/15 1429  02/06/15 0308 02/06/15 1253 02/07/15 0303 02/07/15 1306 02/07/15 1432 02/07/15 2250  HGB 9.0*  --   --   --   < > 10.0* 10.2* 9.3* 8.9*  --   --   HCT 28.6*  --   --   --   < > 30.6* 30.0* 29.6* 28.2*  --   --   PLT 165  --   --   --   < > 161  --  170 165  --   --   LABPROT 14.6  --   --   --   --  13.8  --  14.7  --   --   --   INR 1.12  --   --   --   --  1.04  --  1.13  --   --   --   HEPARINUNFRC 0.34  --   < >  --   --  0.28*  --  0.23*  --  0.47 0.68  CREATININE  --  5.93*  --  6.01*  --   --   --   --  6.40*  --   --   TROPONINI  --  0.03  --   --   --   --   --   --   --   --   --   < > = values in this interval not displayed.  Estimated Creatinine Clearance: 8.4 mL/min (by C-G formula based on Cr of 6.4).  Assessment: 74yof s/p ligation of her left brachiocephalic AV fistula on 123XX123 developed new onset afib and was started on heparin and coumadin. On 11/3 she developed a hematoma at the incision site. Heparin and coumadin were held on 11/4 and she underwent hematoma evacuation. Heparin and coumadin then resumed. Heparin level remains therapeutic (0.68). No bleeding noted.  Goal of Therapy:  Heparin level 0.3-0.7 units/ml Monitor platelets by anticoagulation protocol: Yes   Plan:  Continue heparin drip at  1350 units/hr F/u daily INR, CBC, and heparin level  Sherlon Handing, PharmD, BCPS Clinical pharmacist, pager [phone] 02/08/2015 12:01 AM   Addendum (0200): RN called to say that surgical site is bleeding and on call MD has d/c'd heparin gtt for now.   Plan: F/u bleeding and possible restart of heparin.  Sherlon Handing, PharmD, BCPS Clinical pharmacist, pager [phone] 02/08/2015 2:20 AM

## 2015-02-08 NOTE — Progress Notes (Signed)
Heparin drip was stopped at 01:35 per MD on call telephone with read back order due to some bleeding from patient's surgical site. Will continue to monitor patient, call light within reach.

## 2015-02-08 NOTE — Progress Notes (Addendum)
[facility]  Subjective  - There was reported bleeding from the left incision site last night.  The dressing was removed and a new compression dressing was applied.   Objective 116/74 63 98 F (36.7 C) (Oral) 18 98%  Intake/Output Summary (Last 24 hours) at 02/08/15 0757 Last data filed at 02/07/15 1700  Gross per 24 hour  Intake    540 ml  Output   -243 ml  Net    783 ml    Left arm incision with echymosis and min to mod hematoma.  No active bleeding. Clean dry dressing applied covered with light compression ace from hand to upper arm Active range of motion and sensation intact left UE  Assessment/Planning: POD #2 evacuation hematoma left arm s/p ligation left brachiocephalic AV fistula  Resolved active bleeding at incision site N/V/M intact  Continue elevation and mild compression  [facility] 02/08/2015 7:57 AM --  Laboratory Lab Results:  Recent Labs  02/07/15 1306 02/08/15 0155  WBC 6.3 6.2  HGB 8.9* 8.5*  HCT 28.2* 26.7*  PLT 165 168   BMET  Recent Labs  02/05/15 1429 02/06/15 1253 02/07/15 1306  NA 135 134* 133*  K 3.9 4.3 4.5  CL 94*  --  96*  CO2 32  --  26  GLUCOSE 127* 80 99  BUN 42*  --  39*  CREATININE 6.01*  --  6.40*  CALCIUM 8.4*  --  8.9    COAG Lab Results  Component Value Date   INR 1.26 02/08/2015   INR 1.13 02/07/2015   INR 1.04 02/06/2015   No results found for: PTT   I have examined the patient, reviewed and agree with above.  Had recurrent oozing from the wound after initiation of heparin yesterday.  This was discontinued around 1 AM.  She isn't completely stopped all oozing from the incision this morning.  Pressure dressing reapplied.  We will hold heparin for now and reassess tomorrow.  Discussed with patient and daughter present.  Curt Jews, MD 02/08/2015 9:21 AM

## 2015-02-08 NOTE — Progress Notes (Signed)
ANTICOAGULATION CONSULT NOTE - Follow Up Consult  Pharmacy Consult for Heparin & Coumadin Indication: new onset atrial fibrillation  Allergies  Allergen Reactions  . Sulfa Drugs Cross Reactors Hives and Itching  . Amoxicillin Rash  . Percocet [Oxycodone-Acetaminophen] Itching and Rash    Did not happen last time she took it    Patient Measurements: Height: 5' 5.5" (166.4 cm) Weight: 186 lb 4.8 oz (84.505 kg) IBW/kg (Calculated) : 58.15 Heparin Dosing Weight: 76kg  Vital Signs: Temp: 98 F (36.7 C) (11/06 0614) Temp Source: Oral (11/06 0614) BP: 116/74 mmHg (11/06 0614) Pulse Rate: 63 (11/06 0614)  Labs:  Recent Labs  02/05/15 1429  02/06/15 0308  02/07/15 0303 02/07/15 1306 02/07/15 1432 02/07/15 2250 02/08/15 0155  HGB  --   < > 10.0*  < > 9.3* 8.9*  --   --  8.5*  HCT  --   < > 30.6*  < > 29.6* 28.2*  --   --  26.7*  PLT  --   < > 161  --  170 165  --   --  168  LABPROT  --   --  13.8  --  14.7  --   --   --  15.9*  INR  --   --  1.04  --  1.13  --   --   --  1.26  HEPARINUNFRC  --   --  0.28*  --  0.23*  --  0.47 0.68 0.44  CREATININE 6.01*  --   --   --   --  6.40*  --   --   --   < > = values in this interval not displayed.  Estimated Creatinine Clearance: 8.4 mL/min (by C-G formula based on Cr of 6.4).  Assessment: 74yof s/p ligation of her left brachiocephalic AV fistula on 123XX123 developed new onset afib and was started on heparin and coumadin. On 11/3 she developed a hematoma at the incision site. Heparin and coumadin were held on 11/4 and she underwent hematoma evacuation. Heparin and coumadin then resumed. Last two HL's were therapeutic on heparin 1350 units/hr. INR this am is uo to 1.26. Did not receive 11/4 coumadin dose per Dr. Scot Dock. Plan is to stay until INR > 2. Had some bleeding at the surgical site overnight and plan is to hold heparin today and reassess on 11/7 per vascular. Hgb low at 8.5, plts wnl. Bleeding from incision site has  resolved.  Goal of Therapy:  Heparin level 0.3-0.7 units/ml Monitor platelets by anticoagulation protocol: Yes   Plan:  Hold heparin gtt for today Give Coumadin 7.5mg  PO x 1 tonight Monitor daily INR, CBC, s/s of bleed  Elenor Quinones, PharmD Clinical Pharmacist Pager [phone] 02/08/2015 10:00 AM

## 2015-02-09 ENCOUNTER — Encounter (HOSPITAL_COMMUNITY): Payer: Self-pay | Admitting: Vascular Surgery

## 2015-02-09 LAB — CBC
HCT: 25 % — ABNORMAL LOW (ref 36.0–46.0)
Hemoglobin: 7.9 g/dL — ABNORMAL LOW (ref 12.0–15.0)
MCH: 31 pg (ref 26.0–34.0)
MCHC: 31.6 g/dL (ref 30.0–36.0)
MCV: 98 fL (ref 78.0–100.0)
Platelets: 172 10*3/uL (ref 150–400)
RBC: 2.55 MIL/uL — ABNORMAL LOW (ref 3.87–5.11)
RDW: 15.2 % (ref 11.5–15.5)
WBC: 6.3 10*3/uL (ref 4.0–10.5)

## 2015-02-09 LAB — GLUCOSE, CAPILLARY: Glucose-Capillary: 84 mg/dL (ref 65–99)

## 2015-02-09 LAB — PROTIME-INR
INR: 1.45 (ref 0.00–1.49)
Prothrombin Time: 17.8 seconds — ABNORMAL HIGH (ref 11.6–15.2)

## 2015-02-09 LAB — PREPARE RBC (CROSSMATCH)

## 2015-02-09 MED ORDER — WARFARIN SODIUM 5 MG PO TABS: ORAL_TABLET | ORAL | Status: DC

## 2015-02-09 MED ORDER — VITAMIN D (ERGOCALCIFEROL) 1.25 MG (50000 UNIT) PO CAPS
50000.0000 [IU] | ORAL_CAPSULE | ORAL | Status: DC
  Filled 2015-02-09: qty 1

## 2015-02-09 MED ORDER — SODIUM CHLORIDE 0.9 % IV SOLN: Freq: Once | INTRAVENOUS | Status: DC

## 2015-02-09 MED ORDER — DARBEPOETIN ALFA 60 MCG/0.3ML IJ SOSY: 60.0000 ug | PREFILLED_SYRINGE | INTRAMUSCULAR | Status: DC

## 2015-02-09 MED ORDER — WARFARIN SODIUM 7.5 MG PO TABS: 7.5000 mg | ORAL_TABLET | Freq: Once | ORAL | Status: DC

## 2015-02-09 MED ORDER — POLYETHYLENE GLYCOL 3350 17 G PO PACK: 17.0000 g | PACK | Freq: Every day | ORAL | Status: DC

## 2015-02-09 MED ORDER — DOXERCALCIFEROL 4 MCG/2ML IV SOLN: 2.0000 ug | INTRAVENOUS | Status: DC

## 2015-02-09 MED ORDER — OXYCODONE-ACETAMINOPHEN 5-325 MG PO TABS: 1.0000 | ORAL_TABLET | Freq: Three times a day (TID) | ORAL | Status: DC | PRN

## 2015-02-09 MED ORDER — SUCROFERRIC OXYHYDROXIDE 500 MG PO CHEW: 500.0000 mg | CHEWABLE_TABLET | Freq: Three times a day (TID) | ORAL | Status: DC

## 2015-02-09 MED ORDER — SUCROFERRIC OXYHYDROXIDE 500 MG PO CHEW
500.0000 mg | CHEWABLE_TABLET | Freq: Three times a day (TID) | ORAL | Status: DC
  Filled 2015-02-09 (×3): qty 1

## 2015-02-09 NOTE — Progress Notes (Addendum)
  [facility] Progress Note  Subjective  - POD #3  Elbow hurts, left thumb hurts.   Objective Filed Vitals:   02/09/15 0320  BP: 124/74  Pulse: 71  Temp: 98.3 F (36.8 C)  Resp: 18    Intake/Output Summary (Last 24 hours) at 02/09/15 0746 Last data filed at 02/08/15 1700  Gross per 24 hour  Intake    360 ml  Output      0 ml  Net    360 ml   Moderate hematoma left antecubital. Non tender to palpation.  Sutures intact. 2+ left radial pulse. Sensation motor intact left hand. Left hand swelling  Assessment/Planning: 74 y.o. female is s/p: evacuation hematoma left arm s/p ligation left brachial-cephalic AV fistula 3 Days Post-Op   No further bleeding since 0200 Sunday morning.  New onset afib: Will discuss resuming heparin with Dr. Scot Dock.   [name] 02/09/2015 7:46 AM --  Laboratory CBC    Component Value Date/Time   WBC 6.3 02/09/2015 0355   HGB 7.9* 02/09/2015 0355   HCT 25.0* 02/09/2015 0355   PLT 172 02/09/2015 0355    BMET    Component Value Date/Time   NA 133* 02/07/2015 1306   K 4.5 02/07/2015 1306   CL 96* 02/07/2015 1306   CO2 26 02/07/2015 1306   GLUCOSE 99 02/07/2015 1306   BUN 39* 02/07/2015 1306   CREATININE 6.40* 02/07/2015 1306   CALCIUM 8.9 02/07/2015 1306   GFRNONAA 6* 02/07/2015 1306   GFRAA 7* 02/07/2015 1306    COAG Lab Results  Component Value Date   INR 1.45 02/09/2015   INR 1.26 02/08/2015   INR 1.13 02/07/2015   No results found for: PTT  Antibiotics Anti-infectives    Start     Dose/Rate Route Frequency Ordered Stop   02/07/15 0600  [MAR Hold]  ceFAZolin (ANCEF) IVPB 2 g/50 mL premix     (MAR Hold since 02/06/15 1214)  Comments:  Send with pt to OR   2 g 100 mL/hr over 30 Minutes Intravenous To ShortStay Surgical 02/06/15 0711 02/06/15 1315   02/04/15 1100  vancomycin (VANCOCIN) IVPB 1000 mg/200 mL premix     1,000 mg 200 mL/hr over 60 Minutes Intravenous To ShortStay Surgical 02/03/15 1010  02/04/15 [place], PA-C [facility] Office: [phone] Pager: [phone] 02/09/2015 7:46 AM  Agree with above. Wound looks Ok OK to discharge from my standpoint.  Deitra Mayo, MD, [place] [phone] Office: [phone]

## 2015-02-09 NOTE — Progress Notes (Signed)
Pt discharged home with daughter-in - law.  Alert and oriented x4.  No c/o pain.  IV D/Cd.  Tele D/Cd.  Daughter in law given education on dressing changes for pt's left arm.  Dressing is currently dry/intact.  Pt and family were given education on diet, activity, meds, and follow-up care and appointments.  Pt and fmaily verbalized understanding.  Pt taken home by daughter and will follow-up for dialysis in the am.

## 2015-02-09 NOTE — Progress Notes (Signed)
Physical Therapy Treatment Patient Details Name: [name] MRN: [id number] DOB: 28-Jun-1940 Today's Date: 02/09/2015    History of Present Illness pt is a 74 y/o female with h/o DM with kidney failure with HD, admitted for re-evaluation of permanent dialysis access.  pt showing steal sx fo numbness and weakness in her left hand.  Pt also showing sign of new onset afib.  pt s/p removal of AVF 11/02. Work up for [employer] continues.    PT Comments    Patient continues to progress with mobility.  Still slightly unsteady during gait, but overall does well with assistive device.  Patient will have 24 hour support at home.  Feel patient safe to discharge home with family when medically ready.  Will continue to follow while in hospital to continue to progress toward goals.   Follow Up Recommendations  No PT follow up     Equipment Recommendations  None recommended by PT    Recommendations for Other Services       Precautions / Restrictions Precautions Precautions: Fall Precaution Comments: keep left arm elevated    Mobility  Bed Mobility               General bed mobility comments: in recliner upon arrival  Transfers Overall transfer level: Needs assistance Equipment used: None Transfers: Sit to/from Stand Sit to Stand: Min assist         General transfer comment: attempted sit to stand 3 x before reached standing.  faciliation then provided to power up.  Ambulation/Gait Ambulation/Gait assistance: Min guard Ambulation Distance (Feet): 100 Feet Assistive device: Rolling walker (2 wheeled) Gait Pattern/deviations: Step-through pattern     General Gait Details: needed assist to propel walker due to left UE.  Slightly unsteady initially and then more steady.   Stairs            Wheelchair Mobility    Modified Rankin (Stroke Patients Only)       Balance   Sitting-balance support: No upper extremity supported Sitting balance-Leahy Scale: Good      Standing balance support: No upper extremity supported Standing balance-Leahy Scale: Fair                      Cognition Arousal/Alertness: Awake/alert Behavior During Therapy: WFL for tasks assessed/performed Overall Cognitive Status: Within Functional Limits for tasks assessed                      Exercises      General Comments        Pertinent Vitals/Pain Pain Assessment: Faces Faces Pain Scale: Hurts a little bit Pain Location: left arm Pain Descriptors / Indicators: Discomfort Pain Intervention(s): Limited activity within patient's tolerance;Monitored during session    Home Living                      Prior Function            PT Goals (current goals can now be found in the care plan section) Progress towards PT goals: Progressing toward goals    Frequency  Min 3X/week    PT Plan Current plan remains appropriate    Co-evaluation             End of Session   Activity Tolerance: Patient tolerated treatment well Patient left: in chair;with family/visitor present (MD in room)     Time: 0950-1000 PT Time Calculation (min) (ACUTE ONLY): 10 min  Charges:  $Gait  Training: 8-22 mins                    G Codes:      Shanna Cisco 21-Feb-2015, 10:20 AM  02-21-2015 [place], [place]

## 2015-02-09 NOTE — Care Management Note (Signed)
Case Management Note  Patient Details  Name: JORI JAMIL MRN: [id number] Date of Birth: 1941/01/08  Subjective/Objective:   Pt admitted was scheduled for ligation of fistula, found to be in new onset of A fib  .  Pt is s/p evacuation of hematoma               Action/Plan:    Pt is independent from home with daughter in law and son, daughter in law at bedside.  Pt already has hospital bed, wheelchair, rolling walker, rollator and lift at home.   Expected Discharge Date:  02/07/15               Expected Discharge Plan:   Home Self Care  In-House Referral:     Discharge planning Services     Post Acute Care Choice:    Choice offered to:     DME Arranged:    DME Agency:     HH Arranged:    HH Agency:     Status of Service:   Complete, will sign off  Medicare Important Message Given:  Yes-second notification given Date Medicare IM Given:    Medicare IM give by:    Date Additional Medicare IM Given:    Additional Medicare Important Message give by:     If discussed at [place] of Stay Meetings, dates discussed:    Additional Comments: CM assessed pt, no CM needs determined prior to discharge Maryclare Labrador, RN 02/09/2015, 11:26 AM

## 2015-02-09 NOTE — Progress Notes (Signed)
ANTICOAGULATION CONSULT NOTE - Follow Up Consult  Pharmacy Consult for Coumadin Indication: new onset atrial fibrillation  Allergies  Allergen Reactions  . Sulfa Drugs Cross Reactors Hives and Itching  . Amoxicillin Rash  . Percocet [Oxycodone-Acetaminophen] Itching and Rash    Did not happen last time she took it    Patient Measurements: Height: 5' 5.5" (166.4 cm) Weight: 192 lb 4.8 oz (87.227 kg) IBW/kg (Calculated) : 58.15 Heparin Dosing Weight: 76kg  Vital Signs: Temp: 98.3 F (36.8 C) (11/07 0320) Temp Source: Oral (11/07 0320) BP: 124/74 mmHg (11/07 0320) Pulse Rate: 71 (11/07 0320)  Labs:  Recent Labs  02/07/15 0303 02/07/15 1306 02/07/15 1432 02/07/15 2250 02/08/15 0155 02/09/15 0355  HGB 9.3* 8.9*  --   --  8.5* 7.9*  HCT 29.6* 28.2*  --   --  26.7* 25.0*  PLT 170 165  --   --  168 172  LABPROT 14.7  --   --   --  15.9* 17.8*  INR 1.13  --   --   --  1.26 1.45  HEPARINUNFRC 0.23*  --  0.47 0.68 0.44  --   CREATININE  --  6.40*  --   --   --   --     Estimated Creatinine Clearance: 8.5 mL/min (by C-G formula based on Cr of 6.4).  Assessment: 74yof s/p ligation of her left brachiocephalic AV fistula on [id number] developed new onset afib and was started on heparin and coumadin. On 11/3 she developed a hematoma at the incision site. Heparin and coumadin were held on 11/4 and she underwent hematoma evacuation. Heparin and coumadin then resumed. Last two HL's were therapeutic on heparin 1350 units/hr. - She then had some bleeding from fistula site and heparin drip was held.  H/h low stable.  CHADs score 4 and no Hx stroke in MR - could just use Coumadin.  INR this am is up to 1.45 Bleeding from incision site has resolved.  Goal of Therapy:  Heparin level 0.3-0.7 units/ml Monitor platelets by anticoagulation protocol: Yes   Plan:  Not restarting heparin drip per vascular Give Coumadin 7.5mg  PO x 1 tonight Monitor daily INR, CBC, s/s of bleed If d/c home would  give warfarin 5mg  TTSS, 7.5mg  MWF   Bonnita Nasuti Pharm.D. CPP, BCPS Clinical Pharmacist [phone] 02/09/2015 10:15 AM

## 2015-02-09 NOTE — Progress Notes (Addendum)
Patient ID: [name], female   DOB: May 17, 1940, 74 y.o.   MRN: [id number]  [facility] Progress Note   Assessment/ Plan:   1. New onset atrial fibrillation-complicated management by the left arm hematoma/bleeding necessitating discontinuation of intravenous heparin and initiation of Coumadin. Currently appears rate controlled A. fib. 2.ESRD: Plan for hemodialysis tomorrow via right tunneled IJ dialysis catheter. Status post recent (11/2) ligation of LUA aVF after development of steal syndrome. 3. Anemia: Low hemoglobin-on ESA. Will order 1 unit packed red cell transfusion at dialysis tomorrow given recent losses. 4. CKD-MBD: Elevated phosphorus levels-not controlled on renvela 1600 TIDAC, start velphoro today (High Ca). 5. Nutrition: Low albumin noted-likely acute phase reaction following recent surgery/hematoma. Continue renal diet with protein supplementation. 6. Hypotension: chronic hypotension on midodrine for blood pressure support  Subjective:   Reports to be feeling fair-complaints of left elbow pain as well as left thumb pain.   Objective:   BP 124/74 mmHg  Pulse 71  Temp(Src) 98.3 F (36.8 C) (Oral)  Resp 18  Ht 5' 5.5" (1.664 m)  Wt 87.227 kg (192 lb 4.8 oz)  BMI 31.50 kg/m2  SpO2 96%  Physical Exam: Gen: Appears comfortable sitting in recliner CVS: Pulse irregularly irregular, S1 and S2 normal Resp:  clear to auscultation, no rales/rhonchi  Abd: Soft, obese, nontender  Ext: No lower extremity edema. The left hand noted with Ace wrap dressing that is clean and dry. Good capillary refill.   Labs: BMET  Recent Labs Lab 02/04/15 1224 02/04/15 1630 02/05/15 0345 02/05/15 1429 02/06/15 1253 02/07/15 1306  NA 138 141 140 135 134* 133*  K 4.7 4.6 4.7 3.9 4.3 4.5  CL  --  98* 97* 94*  --  96*  CO2  --  32 33* 32  --  26  GLUCOSE 86 84 72 127* 80 99  BUN  --  29* 40* 42*  --  39*  CREATININE  --  5.01* 5.93* 6.01*  --  6.40*  CALCIUM  --  8.7* 8.7*  8.4*  --  8.9  PHOS  --  5.9*  --  5.3*  --  5.8*   CBC  Recent Labs Lab 02/07/15 0303 02/07/15 1306 02/08/15 0155 02/09/15 0355  WBC 6.2 6.3 6.2 6.3  NEUTROABS  --  3.4  --   --   HGB 9.3* 8.9* 8.5* 7.9*  HCT 29.6* 28.2* 26.7* 25.0*  MCV 96.7 95.9 96.7 98.0  PLT 170 165 168 172   Medications:    . darbepoetin (ARANESP) injection - DIALYSIS  60 mcg Intravenous Q Thu-HD  . docusate sodium  100 mg Oral BID  . doxercalciferol  2 mcg Intravenous Q T,Th,Sa-HD  . ezetimibe  10 mg Oral Daily  . ferric gluconate (FERRLECIT/NULECIT) IV  125 mg Intravenous Q T,Th,Sa-HD  . gabapentin  100 mg Oral QHS  . levothyroxine  100 mcg Oral QAC breakfast  . midodrine  10 mg Oral Q T,Th,Sa-HD  . multivitamin  1 tablet Oral Daily  . polyethylene glycol  17 g Oral Daily  . sevelamer carbonate  1,600 mg Oral TID WC  . sodium chloride  3 mL Intravenous Q12H  . Warfarin - Pharmacist Dosing Inpatient   Does not apply [id number]   Elmarie Shiley, MD 02/09/2015, 9:52 AM

## 2015-02-09 NOTE — Discharge Summary (Signed)
Physician Discharge Summary  [name] [id number] DOB: 10-19-1940 DOA: 02/04/2015  PCP: Sherrie Mustache, MD  Admit date: 02/04/2015 Discharge date: 02/09/2015  Time spent: 40 minutes  Recommendations for Outpatient Follow-up:  1. CBC to follow Hgb trend 2. BMET to follow electrolytes 3. Close follow up to INR and adjust coumadin dose as needed  Discharge Diagnoses:  Principal Problem:   New onset a-fib ([place]) Active Problems:   End stage renal disease ([place])   Steal syndrome dialysis vascular access ([place])   Diabetes mellitus type 2, diet-controlled ([place])   Hypothyroidism   High cholesterol   Anemia of renal disease   Discharge Condition: stable and improved. Discharge home with instructions to follow with PCP in 1 week. Patient will also follow by vascular surgery and cardiology service.  Diet recommendation: heart healthy/renal diet; please watch carbohydrates intake   Filed Weights   02/07/15 1621 02/08/15 0321 02/09/15 0320  Weight: 84.1 kg (185 lb 6.5 oz) 84.505 kg (186 lb 4.8 oz) 87.227 kg (192 lb 4.8 oz)    History of present illness:  74 y.o. female, with end-stage renal disease, hypothyroidism, and diet-controlled diabetes. She presented to [facility] today for an elective procedure to remove a left arm fistula due to subclavian steal syndrome. Per Dr. Bridgett Larsson and EKG was obtained prior to the procedure that showed atrial fibrillation that was felt to be new onset. The patient reports that she has been feeling in her usual state of health recently. She started hemodialysis in August 2016. At that time her amlodipine had to be discontinued due to low blood pressure, and she was placed on midodrine. This and Renvela are the only new medication she started recently. She denies feeling any palpitations or irregular heartbeat. She denies chest pain, exertional dyspnea, orthopnea, PND. She regularly takes her thyroid supplementation. She normally dialyzes on  Tuesday/Thursday/Saturday [facility] Her nephrologist is Dr. Justin Mend.  Hospital Course:  New onset A-fib. Unclear chronicity; and most likely chronic paroxysmal events base on family/patient history -troponin neg, normal TSH and normal 2-D echo -will continue coumadin; heparin discontinue due to complications of bleeding in surgical area; patient with rate controlled (goal is 2-3) -CHADSVASC score 4  Left hand subclavian steal  S/p Left arm fistula removed by Dr. Bridgett Larsson on 11/02. Status post hematoma evacuation and some swelling/bleeding; doing ok and recovering appropriately now  Will follow up with vascular surgery as an outpatient  Per vascular surgery no heparin   ESRD. Access via IJ in right chest.  Continue Scheduled HD on T/T/Sat.  Renal service to provide further recommendations and adjust medication as needed for electrolyte balance  Next HD treatment anticipated on 11/08  Hypothyroidism. TSH WNL Will continue synthroid at current dose  Diabetes Diet controlled. Last Hgb A1C was 5.2  Recent UTI No further dysuria Recently completed antibiotic therapy for UTI prior to admission   Normocytic Anemia Associated with renal failure epogen and IV iron as per renal service discretion   Dyslipidemia -continue zetia -LDL in 70's range  Procedures:  2-De cho - Left ventricle: The cavity size was normal. Wall thickness was increased in a pattern of mild LVH. Systolic function was normal. The estimated ejection fraction was in the range of 55% to 60%. Wall motion was normal; there were no regional wall motion abnormalities. The study is not technically sufficient to allow evaluation of LV diastolic function. - Mitral valve: Calcified annulus. Mildly thickened leaflets . There was mild regurgitation. - Left atrium:  Severely dilated at 49 ml/m2. - Right ventricle: The cavity size was mildly dilated. The moderator band was prominent. Systolic  function was normal. - Right atrium: The atrium was mildly dilated. - Tricuspid valve: There was mild regurgitation. - Pulmonary arteries: PA peak pressure: 24 mm Hg (S). - Inferior vena cava: The vessel was normal in size. The respirophasic diameter changes were in the normal range (>= 50%), consistent with normal central venous pressure.  Impressions: - LVEF 55-60%, mild LVH, normal wall motion, MAC with mild MR, severe LAE, mild RAE, mildly dilated RV with normal function, mild TR, normal RVSP.  Consultations:  Vascular surgery  Renal service  Cardiology   Discharge Exam: Filed Vitals:   02/09/15 0320  BP: 124/74  Pulse: 71  Temp: 98.3 F (36.8 C)  Resp: 18    General: Feeling ok, no CP, no palpitations, no dizziness. Reports some pain in her LUE, but overall well controlled.   Cardiovascular: irregular, but with controlled rate. No rubs or gallops.   Respiratory: CTA bilaterally  Abdomen: soft, NT, ND, positive BS  Musculoskeletal: no edema on LE bilaterally; patient with LUE swelling and compressive dressing in place   Discharge Instructions   Discharge Instructions    Diet - low sodium heart healthy    Complete by:  As directed      Discharge instructions    Complete by:  As directed   Take medications as prescribed Please follow with vascular surgery as instructed Please arrange follow up with PCP in 1 week Follow up with cardiology service as instructed (office will contact you with appointment details)          Current Discharge Medication List    START taking these medications   Details  Darbepoetin Alfa (ARANESP) 60 MCG/0.3ML SOSY injection Inject 0.3 mLs (60 mcg total) into the vein every Thursday with hemodialysis.    doxercalciferol (HECTOROL) 4 MCG/2ML injection Inject 1 mL (2 mcg total) into the vein Every Tuesday,Thursday,and Saturday with dialysis.    oxyCODONE-acetaminophen (PERCOCET/ROXICET) 5-325 MG tablet Take 1 tablet  by mouth every 8 (eight) hours as needed for severe pain. Qty: 30 tablet, Refills: 0    polyethylene glycol (MIRALAX / GLYCOLAX) packet Take 17 g by mouth daily. Qty: 14 each, Refills: 0    sucroferric oxyhydroxide (VELPHORO) 500 MG chewable tablet Chew 1 tablet (500 mg total) by mouth 3 (three) times daily with meals. Qty: 90 tablet, Refills: 1    warfarin (COUMADIN) 5 MG tablet Take 7.5mg  daily on M-W-F abd then 5 mg daily the rest of the week Qty: 45 tablet, Refills: 1      CONTINUE these medications which have NOT CHANGED   Details  acetaminophen (TYLENOL) 325 MG tablet Take 650 mg by mouth every 6 (six) hours as needed for mild pain.    colchicine 0.6 MG tablet TAKE ONE TABLET BY MOUTH EVERY DAY    ezetimibe (ZETIA) 10 MG tablet Take 10 mg by mouth daily.     gabapentin (NEURONTIN) 100 MG capsule Take 100 mg by mouth at bedtime.     levothyroxine (SYNTHROID, LEVOTHROID) 100 MCG tablet Take 100 mcg by mouth daily.      midodrine (PROAMATINE) 10 MG tablet Take 10 mg by mouth. Take on Tuesdays, Thursdays and Saturday before dialysis    multivitamin (RENA-VIT) TABS tablet Take 1 tablet by mouth daily.    sevelamer carbonate (RENVELA) 800 MG tablet Take 1,600 mg by mouth 3 (three) times daily with meals.  Vitamin D, Ergocalciferol, (DRISDOL) 50000 UNITS CAPS Take 50,000 Units by mouth every 30 (thirty) days.      ZEMPLAR 1 MCG capsule Take 1 capsule by mouth every Monday, Wednesday, and Friday.     ACCU-CHEK AVIVA PLUS test strip       STOP taking these medications     aspirin EC 81 MG tablet        Allergies  Allergen Reactions  . Sulfa Drugs Cross Reactors Hives and Itching  . Amoxicillin Rash  . Percocet [Oxycodone-Acetaminophen] Itching and Rash    Did not happen last time she took it   Follow-up Information    Follow up with Sherrie Mustache, MD. Schedule an appointment as soon as possible for a visit in 1 week.   Specialty:  Family Medicine    Contact information:   [place] [place] [phone]       The results of significant diagnostics from this hospitalization (including imaging, microbiology, ancillary and laboratory) are listed below for reference.    Significant Diagnostic Studies: No results found.   Labs: Basic Metabolic Panel:  Recent Labs Lab 02/04/15 1630 02/05/15 0345 02/05/15 1429 02/06/15 1253 02/07/15 1306  NA 141 140 135 134* 133*  K 4.6 4.7 3.9 4.3 4.5  CL 98* 97* 94*  --  96*  CO2 32 33* 32  --  26  GLUCOSE 84 72 127* 80 99  BUN 29* 40* 42*  --  39*  CREATININE 5.01* 5.93* 6.01*  --  6.40*  CALCIUM 8.7* 8.7* 8.4*  --  8.9  PHOS 5.9*  --  5.3*  --  5.8*   Liver Function Tests:  Recent Labs Lab 02/04/15 1630 02/05/15 1429 02/07/15 1306  ALBUMIN 3.1* 2.8* 2.9*   CBC:  Recent Labs Lab 02/06/15 0308 02/06/15 1253 02/07/15 0303 02/07/15 1306 02/08/15 0155 02/09/15 0355  WBC 7.0  --  6.2 6.3 6.2 6.3  NEUTROABS  --   --   --  3.4  --   --   HGB 10.0* 10.2* 9.3* 8.9* 8.5* 7.9*  HCT 30.6* 30.0* 29.6* 28.2* 26.7* 25.0*  MCV 97.8  --  96.7 95.9 96.7 98.0  PLT 161  --  170 165 168 172   Cardiac Enzymes:  Recent Labs Lab 02/04/15 1630 02/04/15 2125 02/05/15 0345  TROPONINI 0.03 0.03 0.03   CBG:  Recent Labs Lab 02/04/15 1443 02/05/15 0656 02/05/15 1116 02/08/15 0616 02/09/15 0645  GLUCAP 95 81 84 84 84    Signed:  Barton Dubois  [facility] 02/09/2015, 11:16 AM

## 2015-02-10 LAB — TYPE AND SCREEN
ABO/RH(D): O POS
Antibody Screen: NEGATIVE
Unit division: 0

## 2015-02-16 ENCOUNTER — Telehealth: Payer: Self-pay | Admitting: Vascular Surgery

## 2015-02-16 DIAGNOSIS — Z7901 Long term (current) use of anticoagulants: Secondary | ICD-10-CM | POA: Insufficient documentation

## 2015-02-16 DIAGNOSIS — I4891 Unspecified atrial fibrillation: Secondary | ICD-10-CM | POA: Diagnosis present

## 2015-02-16 NOTE — Progress Notes (Signed)
Cardiology Office Note   Date:  02/17/2015   ID:  [name], DOB [birth year], MRN [id number]   Patient Care Team: Dione Housekeeper, MD as PCP - General Edrick Oh, MD (Nephrology) Minus Breeding, MD as Consulting Physician (Cardiology)    Chief Complaint  Patient presents with  . Hospitalization Follow-up  . Atrial Fibrillation     History of Present Illness: [name] is a 74 y.o. female with a hx of ESRD on MWF dialysis, DM, HTN, HL, hypothyroidism, orthostatic hypotension.  She has had multiple AVF difficulties. She recently underwent ligation of AVF due to steal. She was admitted 11/2-11/7.  She developed AFib in the pre op area.  Rate was controlled.  Echocardiogram demonstrated normal LV function with severe LAE and mild RAE and normal RVSP. She was placed on Coumadin for anticoagulation. CHADS2-VASc=4.    Review of her chart demonstrates that she was in AF at DC.  She is back in NSR today.  She has not noticed any difference in her energy since DC.  She is under less stress now. She denies chest pain, shortness of breath, syncope, orthopnea, PND, edema.  She denies any bleeding issues.  She is getting her Coumadin checked with her PCP.  She has a hx of mild snoring but no witnessed apneic episodes.  She does note daytime sleepiness.      Studies/Reports Reviewed Today:  Echo 02/05/15 Mild LVH, EF 55-60%, normal wall motion, MAC, mild MR, severe LAE (49 ml/m2), mild RVE, normal RVSF, mild RAE, mild TR, PASP 24 mmHg    Past Medical History  Diagnosis Date  . Hypothyroidism   . HLD (hyperlipidemia)   . Hypertension     sees Dr. Dione Housekeeper  . Diabetes mellitus     "diet controlled"  . Arthritis   . ESRD (end stage renal disease) ([place])     dialysis MWF  . Anemia   . History of blood transfusion   . PAF (paroxysmal atrial fibrillation) ([place])     a. Echo 11/16:  Mild LVH, EF 55-60%, normal wall motion, MAC, mild MR, severe LAE (49 ml/m2), mild RVE, normal RVSF,  mild RAE, mild TR, PASP 24 mmHg;  CHADS2-VASc: 4 >> Coumadin followed by PCP    Past Surgical History  Procedure Laterality Date  . Joint replacement Bilateral     bilateral knee  . Joint replacement Right     shoulder  . Total knee arthroplasty      right knee  . Eye surgery Bilateral     bilateral cataract removal  . Dilation and curettage of uterus    . Tubal ligation    . Av fistula placement  04/03/2012    Procedure: ARTERIOVENOUS (AV) FISTULA CREATION;  Surgeon: Elam Dutch, MD;  Location: [facility] OR;  Service: Vascular;  Laterality: Left;  creation left brachial cephalic fistula   . Ligation of arteriovenous  fistula Left 02/04/2015    Procedure: LIGATION OF BRACHIOCEPHALIC ARTERIOVENOUS  FISTULA;  Surgeon: Conrad Whitakers, MD;  Location: [facility];  Service: Vascular;  Laterality: Left;  . Thrombectomy brachial artery Left 02/06/2015    Procedure: EVACUATION OF LEFT ARM HEMATOMA;  Surgeon: Angelia Mould, MD;  Location: [facility];  Service: Vascular;  Laterality: Left;     Current Outpatient Prescriptions  Medication Sig Dispense Refill  . ACCU-CHEK AVIVA PLUS test strip     . acetaminophen (TYLENOL) 325 MG tablet Take 650 mg by mouth every 6 (six) hours as  needed for mild pain.    Marland Kitchen colchicine 0.6 MG tablet TAKE ONE TABLET BY MOUTH EVERY DAY    . Darbepoetin Alfa (ARANESP) 60 MCG/0.3ML SOSY injection Inject 0.3 mLs (60 mcg total) into the vein every Thursday with hemodialysis.    Marland Kitchen doxercalciferol (HECTOROL) 4 MCG/2ML injection Inject 1 mL (2 mcg total) into the vein Every Tuesday,Thursday,and Saturday with dialysis.    Marland Kitchen ezetimibe (ZETIA) 10 MG tablet Take 10 mg by mouth daily.     Marland Kitchen gabapentin (NEURONTIN) 100 MG capsule Take 100 mg by mouth at bedtime.     Marland Kitchen levothyroxine (SYNTHROID, LEVOTHROID) 100 MCG tablet Take 100 mcg by mouth daily.      . midodrine (PROAMATINE) 10 MG tablet Take 10 mg by mouth. Take on Tuesdays, Thursdays and Saturday before dialysis    . multivitamin  (RENA-VIT) TABS tablet Take 1 tablet by mouth daily.    Marland Kitchen oxyCODONE-acetaminophen (PERCOCET/ROXICET) 5-325 MG tablet Take 1 tablet by mouth every 8 (eight) hours as needed for severe pain. 30 tablet 0  . polyethylene glycol (MIRALAX / GLYCOLAX) packet Take 17 g by mouth daily. 14 each 0  . sevelamer carbonate (RENVELA) 800 MG tablet Take 1,600 mg by mouth 3 (three) times daily with meals.     . Vitamin D, Ergocalciferol, (DRISDOL) 50000 UNITS CAPS Take 50,000 Units by mouth every 30 (thirty) days.      Marland Kitchen warfarin (COUMADIN) 5 MG tablet Take 7.5mg  daily on M-W-F abd then 5 mg daily the rest of the week 45 tablet 1   No current facility-administered medications for this visit.    Allergies:   Sulfa drugs cross reactors; Amoxicillin; and Percocet    Social History:   Social History   Social History  . Marital Status: Married    Spouse Name: N/A  . Number of Children: N/A  . Years of Education: N/A   Social History Main Topics  . Smoking status: Former Smoker    Types: Cigarettes    Quit date: 12/02/2004  . Smokeless tobacco: Never Used     Comment: "stopped 5 years ago"  . Alcohol Use: No  . Drug Use: No  . Sexual Activity: Not Asked   Other Topics Concern  . None   Social History Narrative     Family History:   Family History  Problem Relation Age of Onset  . Diabetes Sister   . Cancer Brother   . Hyperlipidemia Daughter   . Hypertension Daughter   . Hypertension Son   . Diabetes Father     before age 3      ROS:   Please see the history of present illness.   Review of Systems  HENT: Positive for hearing loss.   Cardiovascular: Positive for irregular heartbeat.  Hematologic/Lymphatic: Bruises/bleeds easily.  Musculoskeletal: Positive for back pain.  Genitourinary: Positive for incomplete emptying.  All other systems reviewed and are negative.     PHYSICAL EXAM: VS:  BP 120/62 mmHg  Pulse 70  Ht 5\' 6"  (1.676 m)  Wt 182 lb 9.6 oz (82.827 kg)  BMI  29.49 kg/m2    Wt Readings from Last 3 Encounters:  02/17/15 182 lb 9.6 oz (82.827 kg)  02/09/15 192 lb 4.8 oz (87.227 kg)  01/30/15 179 lb (81.194 kg)     GEN: Well nourished, well developed, in no acute distress HEENT: normal Neck: no JVD at 90 degrees,   no masses Cardiac:  Normal [id number], RRR; 1/6 systolic murmur RUSB,  no  rubs or gallops, no edema   Respiratory:  clear to auscultation bilaterally, no wheezing, rhonchi or rales. GI: soft, nontender, nondistended, + BS MS: no deformity or atrophy Skin: warm and dry  Neuro:  CNs II-XII intact, Strength and sensation are intact Psych: Normal affect   EKG:  EKG is ordered today.  It demonstrates:   NSR, HR 70, LAD, LVH, QTc 475 ms   Recent Labs: 02/04/2015: TSH 0.929 02/07/2015: BUN 39*; Creatinine, Ser 6.40*; Potassium 4.5; Sodium 133* 02/09/2015: Hemoglobin 7.9*; Platelets 172    Lipid Panel    Component Value Date/Time   CHOL 155 02/06/2015 0308   TRIG 111 02/06/2015 0308   HDL 58 02/06/2015 0308   CHOLHDL 2.7 02/06/2015 0308   VLDL 22 02/06/2015 0308   LDLCALC 75 02/06/2015 0308      ASSESSMENT AND PLAN:  1. PAF:  Patient was recently admitted for AVF revision 2/2 steal syndrome.  She was noted to be in AF with CVR.  CHADS2-VASc=4.  She is now on Coumadin which is monitored by PCP.  She is back in NSR today.  She is asymptomatic and rate controlled when in AF.  Continue current Rx.  She is tolerating anticoagulation. She does have a hx of mild snoring and a very large LA.  Arrange home overnight oximetry.  If +, get formal sleep study.    2. ESRD:  MWF dialysis.  3. Hyperlipidemia:  Continue Zetia.  4. HTN:  No longer on medication.  BP runs low at dialysis.       Medication Changes: Current medicines are reviewed at length with the patient today.  Concerns regarding medicines are as outlined above.  The following changes have been made:   Discontinued Medications   SUCROFERRIC OXYHYDROXIDE (VELPHORO) 500 MG  CHEWABLE TABLET    Chew 1 tablet (500 mg total) by mouth 3 (three) times daily with meals.   ZEMPLAR 1 MCG CAPSULE    Take 1 capsule by mouth every Monday, Wednesday, and Friday.    Modified Medications   No medications on file   New Prescriptions   No medications on file   Labs/ tests ordered today include:   Orders Placed This Encounter  Procedures  . Pulse oximetry, overnight  . EKG 12-Lead     Disposition:    She lives in [place] and prefers to FU there. FU with Dr. Minus Breeding in [place] in 3 mos.     Signed, Versie Starks, MHS 02/17/2015 12:21 PM    [facility] [address], [place], [place]  [address] Phone: [phone]; Fax: [phone]

## 2015-02-16 NOTE — Telephone Encounter (Signed)
-----   Message from Denman George, RN sent at 02/16/2015 11:57 AM EST ----- Regarding: needs f/u appt This pt. had Ligation (L) B-C AVF 11/2 by Dr. Bridgett Larsson, and then Evacuation Hematoma (L) arm on 11/4 by Dr. Scot Dock.  She needs a follow-up appt.  It doesn't look like Dr. Bridgett Larsson will have any appts. until after Thanksgiving.   Is it poss. to get her in on 11/16 to see Dr. Scot Dock, or another option could be with NP on 11/18, when Dr. Bridgett Larsson is in the office.  Please call pt's daughter-in-law, Baldo Ash, [place] [phone] with appt. info.

## 2015-02-16 NOTE — Telephone Encounter (Signed)
Spoke with daughter Baldo Ash to schedule- pt has dialysis at 11:55- we can call if pt will be late to dialysis,. Dpm

## 2015-02-17 ENCOUNTER — Encounter: Payer: Self-pay | Admitting: Family

## 2015-02-17 ENCOUNTER — Ambulatory Visit (INDEPENDENT_AMBULATORY_CARE_PROVIDER_SITE_OTHER): Payer: Medicare Other | Admitting: Physician Assistant

## 2015-02-17 ENCOUNTER — Encounter: Payer: Self-pay | Admitting: Physician Assistant

## 2015-02-17 VITALS — BP 120/62 | HR 70 | Ht 66.0 in | Wt 182.6 lb

## 2015-02-17 DIAGNOSIS — R0683 Snoring: Secondary | ICD-10-CM

## 2015-02-17 DIAGNOSIS — I480 Paroxysmal atrial fibrillation: Secondary | ICD-10-CM

## 2015-02-17 DIAGNOSIS — N186 End stage renal disease: Secondary | ICD-10-CM | POA: Diagnosis not present

## 2015-02-17 NOTE — Patient Instructions (Signed)
Medication Instructions:  None  Labwork: None  Testing/Procedures: Your physician has referred you to have an overnight oximetry test.  You will be contacted by [place] to schedule this.  Follow-Up: Your physician recommends that you schedule a follow-up appointment in: 3 months with Dr. Percival Spanish in [place].    Any Other Special Instructions Will Be Listed Below (If Applicable).     If you need a refill on your cardiac medications before your next appointment, please call your pharmacy.

## 2015-02-20 ENCOUNTER — Ambulatory Visit (INDEPENDENT_AMBULATORY_CARE_PROVIDER_SITE_OTHER): Payer: Medicare Other | Admitting: Family

## 2015-02-20 ENCOUNTER — Encounter: Payer: Medicare Other | Admitting: Cardiology

## 2015-02-20 ENCOUNTER — Encounter: Payer: Self-pay | Admitting: Family

## 2015-02-20 VITALS — BP 140/84 | HR 68 | Temp 97.7°F | Resp 16 | Ht 66.0 in | Wt 185.0 lb

## 2015-02-20 DIAGNOSIS — N186 End stage renal disease: Secondary | ICD-10-CM

## 2015-02-20 DIAGNOSIS — T82858S Stenosis of vascular prosthetic devices, implants and grafts, sequela: Secondary | ICD-10-CM

## 2015-02-20 DIAGNOSIS — Z4802 Encounter for removal of sutures: Secondary | ICD-10-CM

## 2015-02-20 DIAGNOSIS — Z992 Dependence on renal dialysis: Secondary | ICD-10-CM

## 2015-02-20 NOTE — Progress Notes (Signed)
    Postoperative Access Visit   History of Present Illness  [name] is a 74 y.o. year old female patient of Dr. Bridgett Larsson who is s/p Ligation (L) B-C AVF 11/2 by Dr. Bridgett Larsson, and then Evacuation Hematoma (L) arm on 11/4 by Dr. Scot Dock. She returns today for post operative follow up and suture removal. The patient is right hand dominant. Previous access procedures have been completed in the left arm. The patient's complication from previous access procedures include: steal sx. The patient has never had a previous PPM placed. The patient has a RIJV TDC due severe infilitration recently.  The patient reports resolution of left arm/hand steal symptoms.   The patient is able to complete their activities of daily living.  The patient's current symptoms are: none.  For VQI Use Only  PRE-ADM LIVING: Home  AMB STATUS: Ambulatory  Physical Examination Filed Vitals:   02/20/15 1115  BP: 140/84  Pulse: 68  Temp: 97.7 F (36.5 C)  TempSrc: Oral  Resp: 16  Height: 5\' 6"  (1.676 m)  Weight: 185 lb (83.915 kg)  SpO2: 99%   Body mass index is 29.87 kg/(m^2).  Left antecubital: Incision is well healed, hematoma is present (3 cm x 3.5 cm), is hard and shallow, skin feels warm, hand grip is 4/5 (recent left carpal tunnel surgery), sensation in digits is intact (tingling in left fingers, also had carpal tunnel syndrome, recent repair), 2+ palpable left brachial and radial pulses.  Right side chest tunneled HD catheter in place.  Medical Decision Making  [name] is a 74 y.o. year old female who presents s/p Ligation (L) B-C AVF 11/2 by Dr. Bridgett Larsson, and then Evacuation Hematoma (L) arm on 11/4 by Dr. Scot Dock. She returns today for post operative follow up and suture removal. All sutures removed today. Incision edges are well proximated, the incision is healed with no signs of infection. The hematoma present at the ligation site is resolving, however, she is on warfarin therapy. Dr. Bridgett Larsson spoke  with pt and daughter and examined pt.; discussed creating another AVF or AVG for HD vs continuing to use tunneled HD catheter for HD. Pt has decided to continue to use her tunneled HD catheter for HD and will let us know if she changes her mind.   NICKEL, Sharmon Leyden, RN, MSN, FNP-C [facility] Office: [phone]  02/20/2015, 11:21 AM  Clinic MD: Bridgett Larsson

## 2015-03-06 ENCOUNTER — Other Ambulatory Visit (HOSPITAL_COMMUNITY): Payer: Self-pay | Admitting: Nephrology

## 2015-03-06 ENCOUNTER — Ambulatory Visit (HOSPITAL_COMMUNITY)
Admission: RE | Admit: 2015-03-06 | Discharge: 2015-03-06 | Disposition: A | Discharge status: Home / Self Care | Payer: Medicare Other | Source: Ambulatory Visit | Attending: Vascular Surgery | Admitting: Vascular Surgery

## 2015-03-06 DIAGNOSIS — I82402 Acute embolism and thrombosis of unspecified deep veins of left lower extremity: Secondary | ICD-10-CM | POA: Diagnosis not present

## 2015-03-06 DIAGNOSIS — I120 Hypertensive chronic kidney disease with stage 5 chronic kidney disease or end stage renal disease: Secondary | ICD-10-CM | POA: Insufficient documentation

## 2015-03-06 DIAGNOSIS — E785 Hyperlipidemia, unspecified: Secondary | ICD-10-CM | POA: Insufficient documentation

## 2015-03-06 DIAGNOSIS — E119 Type 2 diabetes mellitus without complications: Secondary | ICD-10-CM | POA: Diagnosis not present

## 2015-03-06 DIAGNOSIS — N186 End stage renal disease: Secondary | ICD-10-CM | POA: Insufficient documentation

## 2015-03-13 ENCOUNTER — Telehealth: Payer: Self-pay | Admitting: *Deleted

## 2015-03-13 DIAGNOSIS — I4891 Unspecified atrial fibrillation: Secondary | ICD-10-CM

## 2015-03-13 NOTE — Telephone Encounter (Signed)
Pt has been  notified of Overnight Pulse Oximetry test results and findings. Pt advised due to results needs to have Split Night Sleep Study. Pt agreeable to plan of care. I stated bethany will call with appt for sleep study, pt said ok and thank you.

## 2015-03-25 ENCOUNTER — Encounter: Payer: Self-pay | Admitting: Physician Assistant

## 2015-03-31 DIAGNOSIS — Z66 Do not resuscitate: Secondary | ICD-10-CM | POA: Insufficient documentation

## 2015-04-13 ENCOUNTER — Encounter: Payer: Self-pay | Admitting: Gastroenterology

## 2015-04-21 DIAGNOSIS — F411 Generalized anxiety disorder: Secondary | ICD-10-CM | POA: Insufficient documentation

## 2015-04-21 DIAGNOSIS — D519 Vitamin B12 deficiency anemia, unspecified: Secondary | ICD-10-CM | POA: Diagnosis present

## 2015-04-27 ENCOUNTER — Telehealth: Payer: Self-pay | Admitting: Gastroenterology

## 2015-04-27 NOTE — Telephone Encounter (Signed)
Line busy

## 2015-04-30 NOTE — Telephone Encounter (Signed)
No answer no voice mail  

## 2015-05-04 ENCOUNTER — Ambulatory Visit (INDEPENDENT_AMBULATORY_CARE_PROVIDER_SITE_OTHER): Payer: Medicare Other | Admitting: Physician Assistant

## 2015-05-04 ENCOUNTER — Encounter: Payer: Self-pay | Admitting: Physician Assistant

## 2015-05-04 ENCOUNTER — Telehealth: Payer: Self-pay | Admitting: Emergency Medicine

## 2015-05-04 ENCOUNTER — Other Ambulatory Visit (INDEPENDENT_AMBULATORY_CARE_PROVIDER_SITE_OTHER): Payer: Medicare Other

## 2015-05-04 VITALS — BP 100/60 | HR 64 | Ht 66.0 in | Wt 198.0 lb

## 2015-05-04 DIAGNOSIS — K921 Melena: Secondary | ICD-10-CM

## 2015-05-04 DIAGNOSIS — I480 Paroxysmal atrial fibrillation: Secondary | ICD-10-CM

## 2015-05-04 DIAGNOSIS — D649 Anemia, unspecified: Secondary | ICD-10-CM

## 2015-05-04 DIAGNOSIS — R195 Other fecal abnormalities: Secondary | ICD-10-CM

## 2015-05-04 DIAGNOSIS — Z7901 Long term (current) use of anticoagulants: Secondary | ICD-10-CM

## 2015-05-04 LAB — CBC WITH DIFFERENTIAL/PLATELET
Basophils Absolute: 0 10*3/uL (ref 0.0–0.1)
Basophils Relative: 0.4 % (ref 0.0–3.0)
Eosinophils Absolute: 0.5 10*3/uL (ref 0.0–0.7)
Eosinophils Relative: 5.9 % — ABNORMAL HIGH (ref 0.0–5.0)
HCT: 26.7 % — ABNORMAL LOW (ref 36.0–46.0)
Hemoglobin: 8.6 g/dL — ABNORMAL LOW (ref 12.0–15.0)
Lymphocytes Relative: 9.8 % — ABNORMAL LOW (ref 12.0–46.0)
Lymphs Abs: 0.8 10*3/uL (ref 0.7–4.0)
MCHC: 32.3 g/dL (ref 30.0–36.0)
MCV: 99.3 fl (ref 78.0–100.0)
Monocytes Absolute: 0.6 10*3/uL (ref 0.1–1.0)
Monocytes Relative: 6.5 % (ref 3.0–12.0)
Neutro Abs: 6.6 10*3/uL (ref 1.4–7.7)
Neutrophils Relative %: 77.4 % — ABNORMAL HIGH (ref 43.0–77.0)
Platelets: 536 10*3/uL — ABNORMAL HIGH (ref 150.0–400.0)
RBC: 2.69 Mil/uL — ABNORMAL LOW (ref 3.87–5.11)
RDW: 18.4 % — ABNORMAL HIGH (ref 11.5–15.5)
WBC: 8.5 10*3/uL (ref 4.0–10.5)

## 2015-05-04 LAB — FERRITIN: Ferritin: 503.4 ng/mL — ABNORMAL HIGH (ref 10.0–291.0)

## 2015-05-04 LAB — IBC PANEL
Iron: 52 ug/dL (ref 42–145)
Saturation Ratios: 22 % (ref 20.0–50.0)
Transferrin: 169 mg/dL — ABNORMAL LOW (ref 212.0–360.0)

## 2015-05-04 NOTE — Telephone Encounter (Signed)
Pt was seen today by Cecille Rubin

## 2015-05-04 NOTE — Patient Instructions (Signed)
Your physician has requested that you go to the basement for lab work before leaving today.  You will be contaced by our office prior to your procedure for directions on holding your Coumadin/Warfarin.  If you do not hear from our office 1 week prior to your scheduled procedure, please call [phone] to discuss.  You have been scheduled for an endoscopy and colonoscopy. Please follow the written instructions given to you at your visit today. Please pick up your prep supplies at the pharmacy within the next 1-3 days. If you use inhalers (even only as needed), please bring them with you on the day of your procedure. Your physician has requested that you go to www.startemmi.com and enter the access code given to you at your visit today. This web site gives a general overview about your procedure. However, you should still follow specific instructions given to you by our office regarding your preparation for the procedure.

## 2015-05-04 NOTE — Telephone Encounter (Signed)
05/04/2015   RE: [name] DOB: [birth year] MRN: [id number]   Dear Dr.Hochrein,    We have scheduled the above patient for an endoscopic procedure. Our records show that she is on anticoagulation therapy.   Please advise as to how long the patient may come off her therapy of Warfarin prior to the procedure, which is scheduled for 05/12/15.  Please fax back/ or route the completed form to Tinnie Gens, [place].   Sincerely,   Tinnie Gens, [place]

## 2015-05-05 ENCOUNTER — Encounter: Payer: Self-pay | Admitting: Physician Assistant

## 2015-05-05 NOTE — Telephone Encounter (Signed)
The patient may come off of her anticoagulation as needed for her procedure.

## 2015-05-05 NOTE — Progress Notes (Signed)
Patient ID: [name], female   DOB: [birth year], 75 y.o.   MRN: [id number]    HPI:  [name] is a 75 y.o.   female  referred by Dione Housekeeper, MD  for evaluation of anemia. Randi has a history of end-stage renal disease, hypothyroidism, diabetes, atrial fibrillation, thyroid disease, hyperlipidemia. She is on hemodialysis on Monday Wednesdays and Fridays. 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 Pre-procedure EKG revealed new onset atrial fibrillation. She was admitted to the hospital. Troponins were negative and 2-D echo was normal. She has been on warfarin since. Upon discharge from the hospital her hemoglobin was 7.9. Once home, she was contacted by [place] [facility] and was sent a pack of Hemoccult cards to complete. She completed these and mailed them back to the insurance company and was sent to let her that her stools were heme positive and she was advised to see a gastroenterologist. She denies any change in her bowel habits or stool caliber. She denies any Wright red blood per rectum but states for the past 3-4 weeks her stools have been jet black 3 or 4 days per week. She has no upper GI symptoms and denies epigastric pain, nausea, vomiting, dysphagia, odynophagia, or early satiety. She has never had a colonoscopy. She denies a family history of colon cancer, colon polyps, or inflammatory bowel disease. Blood work from [employer] [facility] reveals a hematocrit of 33.3 on 03/25/2015 with an MCV of 103. On 04/29/2015 she was noted to have an iron of 65. CBC at [facility] 02/09/2015 had WBC 6.3, hemoglobin 7.9, hematocrit 25, MCV 98, platelets 172,000. CBC 01/05/2015 at [facility] had WBC 6, hemoglobin 9.3, hematocrit 30, MCV 97.4, platelets 175,000.  Past Medical History  Diagnosis Date  . Hypothyroidism   . HLD (hyperlipidemia)   . Hypertension    sees Dr. Dione Housekeeper  . Diabetes mellitus     "diet controlled"  . Arthritis   . ESRD (end stage renal disease) ([place])     dialysis MWF  . Anemia   . History of blood transfusion   . PAF (paroxysmal atrial fibrillation) ([place])     a. Echo 11/16:  Mild LVH, EF 55-60%, normal wall motion, MAC, mild MR, severe LAE (49 ml/m2), mild RVE, normal RVSF, mild RAE, mild TR, PASP 24 mmHg;  CHADS2-VASc: 4 >> Coumadin followed by PCP    Past Surgical History  Procedure Laterality Date  . Joint replacement Bilateral     bilateral knee  . Joint replacement Right     shoulder  . Total knee arthroplasty      right knee  . Eye surgery Bilateral     bilateral cataract removal  . Dilation and curettage of uterus    . Tubal ligation    . Av fistula placement  04/03/2012    Procedure: ARTERIOVENOUS (AV) FISTULA CREATION;  Surgeon: Elam Dutch, MD;  Location: [facility] OR;  Service: Vascular;  Laterality: Left;  creation left brachial cephalic fistula   . Ligation of arteriovenous  fistula Left 02/04/2015    Procedure: LIGATION OF BRACHIOCEPHALIC ARTERIOVENOUS  FISTULA;  Surgeon: Conrad Perrytown, MD;  Location: [facility];  Service: Vascular;  Laterality: Left;  . Thrombectomy brachial artery Left 02/06/2015    Procedure: EVACUATION OF LEFT ARM HEMATOMA;  Surgeon: Angelia Mould, MD;  Location: [facility];  Service: Vascular;  Laterality: Left;  . Carpal tunnel release    . Portacath placement     Family History  Problem Relation Age of Onset  . Diabetes Sister   . Cancer Brother   . Hyperlipidemia Daughter   . Hypertension Daughter   . Hypertension Son   . Diabetes Father     before age 75   Social History  Substance Use Topics  . Smoking status: Former Smoker    Types: Cigarettes    Quit date: 12/02/2004  . Smokeless tobacco: Never Used     Comment: "stopped 5 years ago"  . Alcohol Use: No   Current Outpatient Prescriptions  Medication Sig Dispense Refill  . ACCU-CHEK AVIVA PLUS test strip      . acetaminophen (TYLENOL) 325 MG tablet Take 650 mg by mouth every 6 (six) hours as needed for mild pain.    Marland Kitchen colchicine 0.6 MG tablet TAKE ONE TABLET BY MOUTH EVERY DAY    . Darbepoetin Alfa (ARANESP) 60 MCG/0.3ML SOSY injection Inject 0.3 mLs (60 mcg total) into the vein every Thursday with hemodialysis.    Marland Kitchen doxercalciferol (HECTOROL) 4 MCG/2ML injection Inject 1 mL (2 mcg total) into the vein Every Tuesday,Thursday,and Saturday with dialysis.    Marland Kitchen ezetimibe (ZETIA) 10 MG tablet Take 10 mg by mouth daily.     Marland Kitchen gabapentin (NEURONTIN) 100 MG capsule Take 100 mg by mouth at bedtime.     Marland Kitchen levothyroxine (SYNTHROID, LEVOTHROID) 100 MCG tablet Take 100 mcg by mouth daily.      . midodrine (PROAMATINE) 10 MG tablet Take 10 mg by mouth. Take on Tuesdays, Thursdays and Saturday before dialysis    . multivitamin (RENA-VIT) TABS tablet Take 1 tablet by mouth daily.    Marland Kitchen oxyCODONE-acetaminophen (PERCOCET/ROXICET) 5-325 MG tablet Take 1 tablet by mouth every 8 (eight) hours as needed for severe pain. 30 tablet 0  . polyethylene glycol (MIRALAX / GLYCOLAX) packet Take 17 g by mouth daily. 14 each 0  . sevelamer carbonate (RENVELA) 800 MG tablet Take 1,600 mg by mouth 3 (three) times daily with meals.     . Vitamin D, Ergocalciferol, (DRISDOL) 50000 UNITS CAPS Take 50,000 Units by mouth every 30 (thirty) days.      Marland Kitchen warfarin (COUMADIN) 5 MG tablet Take 7.5mg  daily on M-W-F abd then 5 mg daily the rest of the week 45 tablet 1   No current facility-administered medications for this visit.   Allergies  Allergen Reactions  . Sulfa Drugs Cross Reactors Hives and Itching  . Amoxicillin Rash  . Percocet [Oxycodone-Acetaminophen] Itching and Rash    Did not happen last time she took it     Review of Systems: Per history of present illness otherwise negative.  Physical Exam: BP 100/60 mmHg  Pulse 64  Ht 5\' 6"  (1.676 m)  Wt 198 lb (89.812 kg)  BMI 31.97 kg/m2 Constitutional:  Pleasant,well-developed,female in no acute distress. HEENT: Normocephalic and atraumatic. Conjunctivae are normal. No scleral icterus. Neck supple.  Cardiovascular: Irregular, 2/6 systolic murmur. Pulmonary/chest: Effort normal and breath sounds normal. No wheezing, rales or rhonchi. Abdominal: Soft, nondistended, nontender. Bowel sounds active throughout. There are no masses palpable. No hepatomegaly. Extremities: no edema Lymphadenopathy: No cervical adenopathy noted. Neurological: Alert and oriented to person place and time. Skin: Skin is warm and dry. No rashes noted. Psychiatric: Normal mood and affect. Behavior is normal.  ASSESSMENT AND PLAN: 75-year.-old female with a history of end-stage renal disease, on dialysis 3 days  a week, with a history of atrial fib on chronic anticoagulation with warfarin, found to have a normocytic anemia. Recently, she has been having melena and has had FOBT positive stools. A CBC, and iron studies will be obtained today. She will be scheduled for an EGD to assess for esophagitis, gastritis, ulcers, AVMs, etc. as well as a colonoscopy to evaluate for polyps, neoplasia, AVMs, or other etiologies of possible heme positive stools.The risks, benefits, and alternatives to endoscopy with possible biopsy and possible dilation were discussed with the patient and they consent to proceed.  The risks, benefits, and alternatives to colonoscopy with possible biopsy and possible polypectomy were discussed with the patient and they consent to proceed.  Will hold warfarin  5 days prior to endoscopic procedures - will instruct when and how to resume after procedure. Benefits and risks of procedure explained including risks of bleeding, perforation, infection, missed lesions, reactions to medications and possible need for hospitalization and surgery for complications. Additional rare but real risk of stroke or other vascular clotting events off warfarin also explained and need to seek  urgent help if any signs of these problems occur. Will communicate by phone or EMR with patient's  prescribing provider to confirm that holding warfarin is reasonable in this case. The procedures will be scheduled with Dr. Marya Landry. Further recommendations will be made pending the findings of the above.    Kacyn Souder, Deloris Ping 05/05/2015, 12:47 PM  CC: Dione Housekeeper, MD

## 2015-05-05 NOTE — Telephone Encounter (Signed)
How many days would you suggest?

## 2015-05-06 ENCOUNTER — Telehealth: Payer: Self-pay | Admitting: Cardiology

## 2015-05-06 NOTE — Telephone Encounter (Signed)
Pt is calling back about stopping her bloodthinner

## 2015-05-06 NOTE — Telephone Encounter (Signed)
Patient needs to know how many days to stop her Warfarin.

## 2015-05-06 NOTE — Telephone Encounter (Signed)
Received call from [facility] [place] calling to ask Dr.Hochrein how many days to hold coumadin for colonoscopy scheduled 05/12/15.Dr.Hochrein out of office.Spoke to [employer] PA she advised ok to hold coumadin 5 days prior to colonoscopy.Message sent to [place].

## 2015-05-07 ENCOUNTER — Encounter: Payer: Self-pay | Admitting: *Deleted

## 2015-05-07 NOTE — Telephone Encounter (Signed)
[facility] and spoke with her she verbalized understanding.

## 2015-05-07 NOTE — Telephone Encounter (Signed)
OK to hold warfarin 5 days prior to the procedure.

## 2015-05-07 NOTE — Telephone Encounter (Signed)
Left message with instructions on patients Emergency contacts voicemail. Unable to reach patient at phone number listed and there is no voicemail.

## 2015-05-07 NOTE — Telephone Encounter (Signed)
Spoke to patients emergency contact and she verbalized understanding

## 2015-05-12 ENCOUNTER — Encounter: Payer: Self-pay | Admitting: Gastroenterology

## 2015-05-12 ENCOUNTER — Ambulatory Visit (AMBULATORY_SURGERY_CENTER): Payer: Medicare Other | Admitting: Gastroenterology

## 2015-05-12 VITALS — BP 135/77 | HR 62 | Temp 97.5°F | Resp 20 | Ht 66.0 in | Wt 198.0 lb

## 2015-05-12 DIAGNOSIS — D128 Benign neoplasm of rectum: Secondary | ICD-10-CM | POA: Diagnosis not present

## 2015-05-12 DIAGNOSIS — D649 Anemia, unspecified: Secondary | ICD-10-CM | POA: Diagnosis present

## 2015-05-12 DIAGNOSIS — D120 Benign neoplasm of cecum: Secondary | ICD-10-CM

## 2015-05-12 DIAGNOSIS — D129 Benign neoplasm of anus and anal canal: Secondary | ICD-10-CM

## 2015-05-12 DIAGNOSIS — D122 Benign neoplasm of ascending colon: Secondary | ICD-10-CM

## 2015-05-12 DIAGNOSIS — R195 Other fecal abnormalities: Secondary | ICD-10-CM

## 2015-05-12 MED ORDER — SODIUM CHLORIDE 0.9 % IV SOLN: 500.0000 mL | INTRAVENOUS | Status: DC

## 2015-05-12 NOTE — Op Note (Signed)
[facility]  [address] [place], [address]   COLONOSCOPY PROCEDURE REPORT  PATIENT: [name], [name]  MR#: [id number] BIRTHDATE: 21-Dec-1940 , 23  yrs. old GENDER: female ENDOSCOPIST: Wilfrid Lund, MD REFERRED [id number] Edrick Oh, M.D. PROCEDURE DATE:  05/12/2015 PROCEDURE:   Colonoscopy with snare polypectomy and Colonoscopy with biopsy  ASA CLASS:   Class III INDICATIONS:anemia, non-specific and occult blood positive stool cards, patient has chronic kidney disease on dialysis and also takes aspirin 81 mg daily. MEDICATIONS: Monitored anesthesia care and Propofol mg IV  DESCRIPTION OF PROCEDURE:   After the risks benefits and alternatives of the procedure were thoroughly explained, informed consent was obtained.  The digital rectal exam revealed decreased sphincter tone.   The LB [id number]  endoscope was introduced through the anus and advanced to the cecum, which was identified by both the appendix and ileocecal valve. No adverse events experienced.   The quality of the prep was good.  (Suprep was used) The instrument was then slowly withdrawn as the colon was fully examined. Estimated blood loss is zero unless otherwise noted in this procedure report.     before beginning the exam, the patient was noted to have a large firm hematoma on the right buttock. After recovering from sedation, the patient reported sustaining this injury after a recent fall. COLON FINDINGS: There was a 2 mm sessile polyp in the cecum.  It was completely removed with cold biopsy forceps and retrieved There was a 10 mm sessile polyp in the proximal ascending colon.  It was completely removed with snare cautery and retrieved. There were 2 sessilepolyps, (one 2 mm, the other 4 mm) in the distal ascending colon,both of which were completely removed with snare cautery and retrieved. There was a 4 mm sessilepolyp in the rectum, completely removed with snare cautery and  retrieved. There was no bleeding from any of the polypectomy sites. there was mild sigmoid diverticulosis.  Retroflexed views revealed internal Grade I hemorrhoids. The time to cecum = 4.6 Withdrawal time = 18.5   The scope was withdrawn and the procedure completed. COMPLICATIONS: There were no immediate complications.  ENDOSCOPIC IMPRESSION: multiple colon polyps sigmoid diverticulosis Internal hemorrhoids  RECOMMENDATIONS: Coumadin can be resumed on 03/15/2016 Patient will return to primary care physician to reconsider aspirin therapy Follow-up colonoscopy interval depending on pathology results.  eSigned:  Wilfrid Lund, MD 05/12/2015 2:40 PM   cc:   PATIENT NAME:  [name], [name] MR#: [id number]

## 2015-05-12 NOTE — Op Note (Signed)
[place]  [address]. [place], [address]   ENDOSCOPY PROCEDURE REPORT  PATIENT: Michelle, Roman  MR#: [id number] BIRTHDATE: [birth year] , 83  yrs. old GENDER: female ENDOSCOPIST: Wilfrid Lund, MD REFERRED BY:  Dione Housekeeper, M.D. PROCEDURE DATE:  05/12/2015 PROCEDURE:  EGD, diagnostic ASA CLASS:     Class III INDICATIONS:  anemia and hemocult positive stool. MEDICATIONS: Monitored anesthesia care and Propofol mg IV TOPICAL ANESTHETIC:  DESCRIPTION OF PROCEDURE: After the risks benefits and alternatives of the procedure were thoroughly explained, informed consent was obtained.  The LB [id number] endoscope was introduced through the mouth and advanced to the second portion of the duodenum , Without limitations.  The instrument was slowly withdrawn as the mucosa was fully examined.      STOMACH: There was mild diffuse hemorrhagic gastropathy There was a small hiatal hernia.  ESOPHAGUS: A mildly severe Schatzki ring was found at the gastroesophageal junction.  Retroflexed views revealed a hiatal hernia.     The scope was then withdrawn from the patient and the procedure completed.  the duodenum was normal in its entirety  COMPLICATIONS: There were no immediate complications.  ENDOSCOPIC IMPRESSION: 1.   There was mild diffuse hemorrhagic gastropathy There was a small hiatal hernia 2.   Schatzki ring was found at the gastroesophageal junction  This patient's anemia appears rarely from chronic kidney disease, with a lesser component of chronic GI blood loss due to hemorrhagic gastropathy  RECOMMENDATIONS: See colonoscopy report Reconsider the need for aspirin in this patient especially in light of aspirin-induced hemorrhagic gastropathy in a patient on Coumadin.  eSigned:  Wilfrid Lund, MD 05/12/2015 2:27 PM    [id number] [place], MD  PATIENT NAME:  Michelle, Roman MR#: [id number]

## 2015-05-12 NOTE — Progress Notes (Signed)
Called to room to assist during endoscopic procedure.  Patient ID and intended procedure confirmed with present staff. Received instructions for my participation in the procedure from the performing physician.  

## 2015-05-12 NOTE — Patient Instructions (Addendum)
Please discuss with your primary care doctor whether or not you should continue aspirin  You may resume Coumadin on 05/17/2015 (5 days from now).  We will contact you with your polyp results  YOU HAD AN ENDOSCOPIC PROCEDURE TODAY AT [facility]:   Refer to the procedure report that was given to you for any specific questions about what was found during the examination.  If the procedure report does not answer your questions, please call your gastroenterologist to clarify.  If you requested that your care partner not be given the details of your procedure findings, then the procedure report has been included in a sealed envelope for you to review at your convenience later.  YOU SHOULD EXPECT: Some feelings of bloating in the abdomen. Passage of more gas than usual.  Walking can help get rid of the air that was put into your GI tract during the procedure and reduce the bloating. If you had a lower endoscopy (such as a colonoscopy or flexible sigmoidoscopy) you may notice spotting of blood in your stool or on the toilet paper. If you underwent a bowel prep for your procedure, you may not have a normal bowel movement for a few days.  Please Note:  You might notice some irritation and congestion in your nose or some drainage.  This is from the oxygen used during your procedure.  There is no need for concern and it should clear up in a day or so.  SYMPTOMS TO REPORT IMMEDIATELY:   Following lower endoscopy (colonoscopy or flexible sigmoidoscopy):  Excessive amounts of blood in the stool  Significant tenderness or worsening of abdominal pains  Swelling of the abdomen that is new, acute  Fever of 100F or higher   Following upper endoscopy (EGD)  Vomiting of blood or coffee ground material  New chest pain or pain under the shoulder blades  Painful or persistently difficult swallowing  New shortness of breath  Fever of 100F or higher  Black, tarry-looking stools  For urgent or  emergent issues, a gastroenterologist can be reached at any hour by calling [phone].   DIET: Your first meal following the procedure should be a small meal and then it is ok to progress to your normal diet. Heavy or fried foods are harder to digest and may make you feel nauseous or bloated.  Likewise, meals heavy in dairy and vegetables can increase bloating.  Drink plenty of fluids but you should avoid alcoholic beverages for 24 hours.  ACTIVITY:  You should plan to take it easy for the rest of today and you should NOT DRIVE or use heavy machinery until tomorrow (because of the sedation medicines used during the test).    FOLLOW UP: Our staff will call the number listed on your records the next business day following your procedure to check on you and address any questions or concerns that you may have regarding the information given to you following your procedure. If we do not reach you, we will leave a message.  However, if you are feeling well and you are not experiencing any problems, there is no need to return our call.  We will assume that you have returned to your regular daily activities without incident.  If any biopsies were taken you will be contacted by phone or by letter within the next 1-3 weeks.  Please call us at [phone] if you have not heard about the biopsies in 3 weeks.    SIGNATURES/CONFIDENTIALITY: You and/or  your care partner have signed paperwork which will be entered into your electronic medical record.  These signatures attest to the fact that that the information above on your After Visit Summary has been reviewed and is understood.  Full responsibility of the confidentiality of this discharge information lies with you and/or your care-partner.   Information on polyps given to you today

## 2015-05-12 NOTE — Progress Notes (Signed)
A/ox3 pleased with MAC, report to Penny RN 

## 2015-05-13 ENCOUNTER — Encounter: Payer: Self-pay | Admitting: Cardiology

## 2015-05-13 ENCOUNTER — Ambulatory Visit (INDEPENDENT_AMBULATORY_CARE_PROVIDER_SITE_OTHER): Payer: Medicare Other | Admitting: Cardiology

## 2015-05-13 ENCOUNTER — Telehealth: Payer: Self-pay

## 2015-05-13 VITALS — BP 121/83 | HR 75 | Ht 66.0 in | Wt 188.0 lb

## 2015-05-13 DIAGNOSIS — I480 Paroxysmal atrial fibrillation: Secondary | ICD-10-CM

## 2015-05-13 NOTE — Patient Instructions (Signed)
Medication Instructions:  The current medical regimen is effective;  continue present plan and medications.  Follow-Up: Follow up in 3 months with Dr Percival Spanish in [place].  If you need a refill on your cardiac medications before your next appointment, please call your pharmacy.  Thank you for choosing [facility]!!

## 2015-05-13 NOTE — Telephone Encounter (Signed)
  Follow up Call-  Call back number 05/12/2015  Post procedure Call Back phone  # [phone]  Permission to leave phone message Yes    Patient was called for follow up after procedure on 05/12/2015. No answer at the number given for follow up phone call. I was unable to leave a message because the answering machine did not come on.

## 2015-05-13 NOTE — Progress Notes (Signed)
Cardiology Office Note   Date:  05/14/2015   ID:  [name], DOB 01-Dec-1940, MRN [id number]  PCP:  Sherrie Mustache, MD  Cardiologist:   Minus Breeding, MD   Chief Complaint  Patient presents with  . Atrial Fibrillation      History of Present Illness: [name] is a 75 y.o. female who presents for follow-up of atrial fibrillation. She was found to have this recently when she was in the hospital for ligation of an arteriovenous fistula. She's been on anticoagulation. She did have some GI bleeding and recently had an evaluation.  I reviewed these records. There was some mild hemorrhagic gastritis benign colonic polyps. Was thought that her anemia may have been more of chronic disease. She has been approved to restart her anticoagulation this weekend. She's noticed no black stools or red blood recently. She doesn't feel any palpitations although on the 17th of last month she had to go to the emergency room after she fell and developed a hematoma. She had been suffering to the trauma of her husband and nephew dying. Her heart rate was apparently elevated needed to be treated with diltiazem. She wasn't feeling it. She doesn't have any new shortness of breath, PND or orthopnea. She has no chest pressure, neck or arm discomfort. She does have low blood pressures during dialysis and has to take Midodrine.  Past Medical History  Diagnosis Date  . Hypothyroidism   . HLD (hyperlipidemia)   . Hypertension     sees Dr. Dione Housekeeper  . Diabetes mellitus     "diet controlled"  . Arthritis   . ESRD (end stage renal disease) ([place])     dialysis MWF  . Anemia   . History of blood transfusion   . PAF (paroxysmal atrial fibrillation) ([place])     a. Echo 11/16:  Mild LVH, EF 55-60%, normal wall motion, MAC, mild MR, severe LAE (49 ml/m2), mild RVE, normal RVSF, mild RAE, mild TR, PASP 24 mmHg;  CHADS2-VASc: 4 >> Coumadin followed by PCP    Past Surgical History  Procedure Laterality Date    . Joint replacement Bilateral     bilateral knee  . Joint replacement Right     shoulder  . Total knee arthroplasty      right knee  . Eye surgery Bilateral     bilateral cataract removal  . Dilation and curettage of uterus    . Tubal ligation    . Av fistula placement  04/03/2012    Procedure: ARTERIOVENOUS (AV) FISTULA CREATION;  Surgeon: Elam Dutch, MD;  Location: [facility] OR;  Service: Vascular;  Laterality: Left;  creation left brachial cephalic fistula   . Ligation of arteriovenous  fistula Left 02/04/2015    Procedure: LIGATION OF BRACHIOCEPHALIC ARTERIOVENOUS  FISTULA;  Surgeon: Conrad Los Berros, MD;  Location: [facility];  Service: Vascular;  Laterality: Left;  . Thrombectomy brachial artery Left 02/06/2015    Procedure: EVACUATION OF LEFT ARM HEMATOMA;  Surgeon: Angelia Mould, MD;  Location: [facility] OR;  Service: Vascular;  Laterality: Left;  . Carpal tunnel release    . Portacath placement       Current Outpatient Prescriptions  Medication Sig Dispense Refill  . ACCU-CHEK AVIVA PLUS test strip     . acetaminophen (TYLENOL) 325 MG tablet Take 650 mg by mouth every 6 (six) hours as needed for mild pain.    Marland Kitchen colchicine 0.6 MG tablet TAKE ONE TABLET BY MOUTH EVERY DAY    .  Darbepoetin Alfa (ARANESP) 60 MCG/0.3ML SOSY injection Inject 0.3 mLs (60 mcg total) into the vein every Thursday with hemodialysis.    Marland Kitchen doxercalciferol (HECTOROL) 4 MCG/2ML injection Inject 1 mL (2 mcg total) into the vein Every Tuesday,Thursday,and Saturday with dialysis.    Marland Kitchen ezetimibe (ZETIA) 10 MG tablet Take 10 mg by mouth daily.     Marland Kitchen gabapentin (NEURONTIN) 100 MG capsule Take 100 mg by mouth at bedtime.     Marland Kitchen levothyroxine (SYNTHROID, LEVOTHROID) 100 MCG tablet Take 100 mcg by mouth daily.      . midodrine (PROAMATINE) 10 MG tablet Take 10 mg by mouth. Take on Tuesdays, Thursdays and Saturday before dialysis    . multivitamin (RENA-VIT) TABS tablet Take 1 tablet by mouth daily.    Marland Kitchen  oxyCODONE-acetaminophen (PERCOCET/ROXICET) 5-325 MG tablet Take 1 tablet by mouth every 8 (eight) hours as needed for severe pain. 30 tablet 0  . polyethylene glycol (MIRALAX / GLYCOLAX) packet Take 17 g by mouth daily. 14 each 0  . Vitamin D, Ergocalciferol, (DRISDOL) 50000 UNITS CAPS Take 50,000 Units by mouth every 30 (thirty) days.      Marland Kitchen warfarin (COUMADIN) 5 MG tablet Take 7.5mg  daily on M-W-F abd then 5 mg daily the rest of the week 45 tablet 1   No current facility-administered medications for this visit.   Facility-Administered Medications Ordered in Other Visits  Medication Dose Route Frequency Provider Last Rate Last Dose  . 0.9 %  sodium chloride infusion  500 mL Intravenous Continuous Nelida Meuse III, MD        Allergies:   Sulfa drugs cross reactors; Amoxicillin; and Percocet    ROS:  Please see the history of present illness.   Otherwise, review of systems are positive for none.   All other systems are reviewed and negative.    PHYSICAL EXAM: VS:  BP 121/83 mmHg  Pulse 75  Ht 5\' 6"  (1.676 m)  Wt 188 lb (85.276 kg)  BMI 30.36 kg/m2  SpO2 95% , BMI Body mass index is 30.36 kg/(m^2). GENERAL:  Well appearing NECK:  No jugular venous distention, waveform within normal limits, carotid upstroke brisk and symmetric, no bruits, no thyromegaly LYMPHATICS:  No cervical, inguinal adenopathy LUNGS:  Clear to auscultation bilaterally BACK:  No CVA tenderness CHEST:  Unremarkable HEART:  PMI not displaced or sustained,S1 and S2 within normal limits, no S3, no S4, no clicks, no rubs, no murmurs ABD:  Flat, positive bowel sounds normal in frequency in pitch, no bruits, no rebound, no guarding, no midline pulsatile mass, no hepatomegaly, no splenomegaly EXT:  2 plus pulses throughout, no edema, no cyanosis no clubbing SKIN:  No rashes no nodules   EKG:  EKG is not ordered today.    Recent Labs: 02/04/2015: TSH 0.929 02/07/2015: BUN 39*; Creatinine, Ser 6.40*; Potassium 4.5;  Sodium 133* 05/04/2015: Hemoglobin 8.6 Repeated and verified X2.*; Platelets 536.0*    Lipid Panel    Component Value Date/Time   CHOL 155 02/06/2015 0308   TRIG 111 02/06/2015 0308   HDL 58 02/06/2015 0308   CHOLHDL 2.7 02/06/2015 0308   VLDL 22 02/06/2015 0308   LDLCALC 75 02/06/2015 0308      Wt Readings from Last 3 Encounters:  05/13/15 188 lb (85.276 kg)  05/12/15 198 lb (89.812 kg)  05/04/15 198 lb (89.812 kg)      Other studies Reviewed: Additional studies/ records that were reviewed today include: GI records pulse ox results,  Review of the  above records demonstrates:  Please see elsewhere in the note.     ASSESSMENT AND PLAN:   PAF: She was noted to be in AF with CVR. CHADS2-VASc=4. She is now on Coumadin which is monitored by PCP. She is back in NSR today. She is asymptomatic and rate controlled when in AF. She will continue on medications.   ESRD: MWF dialysis.  SLEEP APNEA the patient has documented oxygen to saturations severely on overnight pulse oximetry. She will need a formal sleep study.:    HYPERLIPIDEMIA: Continue Zetia.  HTN: No longer on medication. BP runs low at dialysis.   Current medicines are reviewed at length with the patient today.  The patient does not have concerns regarding medicines.  The following changes have been made:  no change  Labs/ tests ordered today include:  No orders of the defined types were placed in this encounter.     Disposition:   FU with me in 3 months.     Signed, Minus Breeding, MD  05/14/2015 9:07 AM    [facility]

## 2015-05-14 ENCOUNTER — Encounter: Payer: Self-pay | Admitting: Cardiology

## 2015-05-20 ENCOUNTER — Telehealth: Payer: Self-pay | Admitting: Cardiology

## 2015-05-20 NOTE — Telephone Encounter (Signed)
New message  Pt request a call back to discuss sleep study that is scheduled on 07/26/2015

## 2015-05-21 NOTE — Telephone Encounter (Signed)
Family just wanted to confirm the location.

## 2015-05-26 ENCOUNTER — Encounter: Payer: Self-pay | Admitting: Gastroenterology

## 2015-05-26 ENCOUNTER — Telehealth: Payer: Self-pay | Admitting: Gastroenterology

## 2015-05-27 NOTE — Telephone Encounter (Signed)
Pt was notified that she is to follow up with PCP or Cardiologist to discuss ASA therapy.  Pt is on coumadin and has anemia.  Path results also given to the pt over the phone and told that the path letter was mailed.

## 2015-05-27 NOTE — Telephone Encounter (Signed)
[name] - is wanting to speak with nurse - states patients stools are still black and tarry [phone]

## 2015-05-27 NOTE — Telephone Encounter (Signed)
No answer no voice mail  

## 2015-06-02 ENCOUNTER — Ambulatory Visit: Payer: Medicare Other | Admitting: Gastroenterology

## 2015-06-16 DIAGNOSIS — J209 Acute bronchitis, unspecified: Secondary | ICD-10-CM

## 2015-07-26 ENCOUNTER — Ambulatory Visit (HOSPITAL_BASED_OUTPATIENT_CLINIC_OR_DEPARTMENT_OTHER): Payer: Medicare Other | Attending: Physician Assistant | Admitting: Cardiology

## 2015-07-26 VITALS — Ht 66.0 in | Wt 188.0 lb

## 2015-07-26 DIAGNOSIS — R5383 Other fatigue: Secondary | ICD-10-CM | POA: Insufficient documentation

## 2015-07-26 DIAGNOSIS — I4891 Unspecified atrial fibrillation: Secondary | ICD-10-CM

## 2015-07-26 DIAGNOSIS — G4761 Periodic limb movement disorder: Secondary | ICD-10-CM | POA: Diagnosis not present

## 2015-07-26 DIAGNOSIS — R0683 Snoring: Secondary | ICD-10-CM | POA: Insufficient documentation

## 2015-07-26 HISTORY — PX: SPLIT NIGHT STUDY: SLE1000

## 2015-08-02 ENCOUNTER — Encounter (HOSPITAL_BASED_OUTPATIENT_CLINIC_OR_DEPARTMENT_OTHER): Payer: Self-pay | Admitting: Cardiology

## 2015-08-02 ENCOUNTER — Telehealth: Payer: Self-pay | Admitting: Cardiology

## 2015-08-02 NOTE — Telephone Encounter (Signed)
Please let patient know that sleep study showed no significant sleep apnea.  She does have periodic limb movements and snoring.  Please find out if patient has any problems with restless legs and also let her know that she does have moderate snoring so can refer to ENT for evaluation is she would like

## 2015-08-02 NOTE — Procedures (Addendum)
   Patient Name: [name], [name] MRN: [id number] Study Date: 07/26/2015 Gender: Female D.O.B: 01-Mar-1941 Age (years): 20 Referring Provider: Richardson Dopp Interpreting Physician: Fransico Him MD, ABSM RPSGT: Joni Reining  Weight (lbs): 188 BMI: 30 Height (inches): 66 Neck Size: 14.00  CLINICAL INFORMATION Sleep Study Type: NPSG Indication for sleep study: Fatigue Epworth Sleepiness Score: 7  SLEEP STUDY TECHNIQUE As per the AASM Manual for the Scoring of Sleep and Associated Events v2.3 (April 2016) with a hypopnea requiring 4% desaturations. The channels recorded and monitored were frontal, central and occipital EEG, electrooculogram (EOG), submentalis EMG (chin), nasal and oral airflow, thoracic and abdominal wall motion, anterior tibialis EMG, snore microphone, electrocardiogram, and pulse oximetry.  MEDICATIONS Patient's medications include: Reviewed in the chart. Medications self-administered by patient during sleep study : No sleep medicine administered.  SLEEP ARCHITECTURE The study was initiated at 10:04:09 PM and ended at 4:37:40 AM. Sleep onset time was 27.5 minutes and the sleep efficiency was 90.8%. The total sleep time was 357.5 minutes. Stage REM latency was 103.0 minutes. The patient spent 1.96% of the night in stage N1 sleep, 51.75% in stage N2 sleep, 38.60% in stage N3 and 7.69% in REM. Alpha intrusion was absent. Supine sleep was 100.00%.  RESPIRATORY PARAMETERS The overall apnea/hypopnea index (AHI) was 2.7 per hour. There were 2 total apneas, including 2 obstructive, 0 central and 0 mixed apneas. There were 14 hypopneas and 9 RERAs. The AHI during Stage REM sleep was 30.5 per hour. AHI while supine was 2.7 per hour. The mean oxygen saturation was 93.78%. The minimum SpO2 during sleep was 82.00%. Moderate snoring was noted during this study.  CARDIAC DATA The 2 lead EKG demonstrated sinus rhythm. The mean heart rate was 65.93 beats per minute. Other EKG  findings include: None.  LEG MOVEMENT DATA The total PLMS were 487 with a resulting PLMS index of 81.73. Associated arousal with leg movement index was 8.4 .  IMPRESSIONS - No significant obstructive sleep apnea occurred during this study (AHI = 2.7/h). - No significant central sleep apnea occurred during this study (CAI = 0.0/h). - Mild oxygen desaturation was noted during this study (Min O2 = 82.00%).  Time spent with oxygen saturations < 88% was < 2 minutes. - The patient snored with Moderate snoring volume. - No cardiac abnormalities were noted during this study. - Severe periodic limb movements of sleep occurred during the study. Associated arousals were significant.  DIAGNOSIS - Periodic Limb Movement Disorder - Snoring  RECOMMENDATIONS - Avoid alcohol, sedatives and other CNS depressants that may worsen sleep apnea and disrupt normal sleep architecture. - Sleep hygiene should be reviewed to assess factors that may improve sleep quality. - Weight management and regular exercise should be initiated or continued if appropriate. -  No significant oxygen desaturations.  The oxygen desaturations were < 88% for < 2 minutes out of entire sleep study.      Sueanne Margarita Diplomate, American Board of Sleep Medicine  ELECTRONICALLY SIGNED ON:  08/02/2015, 10:54 PM [place] PH: [phone]   FX: [phone] [place]

## 2015-08-03 NOTE — Telephone Encounter (Signed)
Left message for patient to call back  

## 2015-08-04 NOTE — Telephone Encounter (Signed)
Patient and family informed of information.   She has bee diagnosed with restless leg syndrome, and she is on medication. They decline a referral to ENT at this time.

## 2015-08-13 ENCOUNTER — Telehealth: Payer: Self-pay | Admitting: Cardiology

## 2015-08-13 NOTE — Telephone Encounter (Signed)
Patient called back stated that she believes the voicemail was [place].   They are leaving the house in a few minutes and she would like a call back.

## 2015-08-13 NOTE — Telephone Encounter (Signed)
I have not called patient since last week.   Phone note was routed to me from [facility] triage.   Patient states that the number on the caller ID was [phone] and the call was at 9:05 this morning.  Routing back to [facility] so they can figure out who was calling her.

## 2015-08-13 NOTE — Telephone Encounter (Signed)
New message   [name] is calling for pt she states that a vm was left and she did not know who called pt   She did not understand the directions left on vm

## 2015-08-14 ENCOUNTER — Telehealth: Payer: Self-pay | Admitting: Cardiology

## 2015-08-14 NOTE — Telephone Encounter (Signed)
Pt returning call from [place].

## 2015-08-14 NOTE — Telephone Encounter (Signed)
Follow Up  Pt is returning the call

## 2015-08-14 NOTE — Telephone Encounter (Signed)
Upon chart review, unable to locate where she was called

## 2015-08-17 NOTE — Telephone Encounter (Signed)
Result of sleep study was given to pt by Talbert Cage

## 2015-08-26 ENCOUNTER — Ambulatory Visit (INDEPENDENT_AMBULATORY_CARE_PROVIDER_SITE_OTHER): Payer: Medicare Other | Admitting: Cardiology

## 2015-08-26 ENCOUNTER — Encounter: Payer: Self-pay | Admitting: Cardiology

## 2015-08-26 VITALS — BP 98/60 | HR 72 | Ht 65.0 in | Wt 191.0 lb

## 2015-08-26 DIAGNOSIS — R0602 Shortness of breath: Secondary | ICD-10-CM

## 2015-08-26 NOTE — Patient Instructions (Signed)
Medication Instructions:  The current medical regimen is effective;  continue present plan and medications.  Labwork: Please have blood work drawn the next time you have blood work done.  Follow-Up: Follow up in 1 year with Dr. Percival Spanish in [place].  You will receive a letter in the mail 2 months before you are due.  Please call us when you receive this letter to schedule your follow up appointment.  If you need a refill on your cardiac medications before your next appointment, please call your pharmacy.  Thank you for choosing [facility]!!

## 2015-08-26 NOTE — Progress Notes (Signed)
Cardiology Office Note   Date:  08/26/2015   ID:  [name], DOB 1941-02-01, MRN [id number]  PCP:  Sherrie Mustache, MD  Cardiologist:   Minus Breeding, MD   No chief complaint on file.     History of Present Illness: [name] is a 75 y.o. female who presents for follow-up of atrial fibrillation. She was found to have this when she was in the hospital for ligation of an arteriovenous fistula. She's been on anticoagulation. She did have some GI bleeding and recently had an evaluation. There was some mild hemorrhagic gastritis benign colonic polyps. She was thought to have anemia may have been more of chronic disease. She has been approved to restart her anticoagulation and she has been on this for a while.  .   She returns for follow up.  The patient denies any new symptoms such as chest discomfort, neck or arm discomfort. There has been no new shortness of breath, PND or orthopnea. There have been no reported palpitations, presyncope or syncope.  She has had no evidence of bleeding.  I did send her for sleep study and there was no significant apnea.  She still has low BPs at dialysis.   Past Medical History  Diagnosis Date  . Hypothyroidism   . HLD (hyperlipidemia)   . Hypertension     sees Dr. Dione Housekeeper  . Diabetes mellitus     "diet controlled"  . Arthritis   . ESRD (end stage renal disease) ([place])     dialysis MWF  . Anemia   . History of blood transfusion   . PAF (paroxysmal atrial fibrillation) ([place])     a. Echo 11/16:  Mild LVH, EF 55-60%, normal wall motion, MAC, mild MR, severe LAE (49 ml/m2), mild RVE, normal RVSF, mild RAE, mild TR, PASP 24 mmHg;  CHADS2-VASc: 4 >> Coumadin followed by PCP    Past Surgical History  Procedure Laterality Date  . Joint replacement Bilateral     bilateral knee  . Joint replacement Right     shoulder  . Total knee arthroplasty      right knee  . Eye surgery Bilateral     bilateral cataract removal  . Dilation and  curettage of uterus    . Tubal ligation    . Av fistula placement  04/03/2012    Procedure: ARTERIOVENOUS (AV) FISTULA CREATION;  Surgeon: Elam Dutch, MD;  Location: [facility] OR;  Service: Vascular;  Laterality: Left;  creation left brachial cephalic fistula   . Ligation of arteriovenous  fistula Left 02/04/2015    Procedure: LIGATION OF BRACHIOCEPHALIC ARTERIOVENOUS  FISTULA;  Surgeon: Conrad Catawissa, MD;  Location: [facility];  Service: Vascular;  Laterality: Left;  . Thrombectomy brachial artery Left 02/06/2015    Procedure: EVACUATION OF LEFT ARM HEMATOMA;  Surgeon: Angelia Mould, MD;  Location: [facility] OR;  Service: Vascular;  Laterality: Left;  . Carpal tunnel release    . Portacath placement    . Split night study  07/26/2015     Current Outpatient Prescriptions  Medication Sig Dispense Refill  . acetaminophen (TYLENOL) 325 MG tablet Take 650 mg by mouth every 6 (six) hours as needed for mild pain.    Marland Kitchen albuterol (PROVENTIL HFA;VENTOLIN HFA) 108 (90 Base) MCG/ACT inhaler Inhale 2 puffs into the lungs every 6 (six) hours as needed for wheezing or shortness of breath.    . colchicine 0.6 MG tablet TAKE ONE TABLET BY MOUTH EVERY DAY    .  Darbepoetin Alfa (ARANESP) 60 MCG/0.3ML SOSY injection Inject 0.3 mLs (60 mcg total) into the vein every Thursday with hemodialysis.    Marland Kitchen doxercalciferol (HECTOROL) 4 MCG/2ML injection Inject 1 mL (2 mcg total) into the vein Every Tuesday,Thursday,and Saturday with dialysis.    Marland Kitchen ezetimibe (ZETIA) 10 MG tablet Take 10 mg by mouth daily.     Marland Kitchen gabapentin (NEURONTIN) 100 MG capsule Take 100 mg by mouth at bedtime.     Marland Kitchen levothyroxine (SYNTHROID, LEVOTHROID) 100 MCG tablet Take 100 mcg by mouth daily.      . midodrine (PROAMATINE) 10 MG tablet Take 10 mg by mouth. Take on Tuesdays, Thursdays and Saturday before dialysis    . multivitamin (RENA-VIT) TABS tablet Take 1 tablet by mouth daily.    Marland Kitchen oxyCODONE-acetaminophen (PERCOCET/ROXICET) 5-325 MG tablet Take 1  tablet by mouth every 8 (eight) hours as needed for severe pain. 30 tablet 0  . polyethylene glycol (MIRALAX / GLYCOLAX) packet Take 17 g by mouth daily. 14 each 0  . Vitamin D, Ergocalciferol, (DRISDOL) 50000 UNITS CAPS Take 50,000 Units by mouth every 30 (thirty) days.      Marland Kitchen warfarin (COUMADIN) 5 MG tablet Take 7.5mg  daily on M-W-F abd then 5 mg daily the rest of the week 45 tablet 1  . ACCU-CHEK AVIVA PLUS test strip      No current facility-administered medications for this visit.    Allergies:   Sulfa drugs cross reactors; Amoxicillin; and Percocet    ROS:  Please see the history of present illness.   Otherwise, review of systems are positive for none.   All other systems are reviewed and negative.    PHYSICAL EXAM: VS:  BP 98/60 mmHg  Pulse 72  Ht 5\' 5"  (1.651 m)  Wt 191 lb (86.637 kg)  BMI 31.78 kg/m2 , BMI Body mass index is 31.78 kg/(m^2). GENERAL:  Well appearing NECK:  No jugular venous distention, waveform within normal limits, carotid upstroke brisk and symmetric, no bruits, no thyromegaly LYMPHATICS:  No cervical, inguinal adenopathy LUNGS:  Clear to auscultation bilaterally BACK:  No CVA tenderness CHEST:  Unremarkable, dialysis catheter HEART:  PMI not displaced or sustained,S1 and S2 within normal limits, no S3, no S4, no clicks, no rubs, no murmurs ABD:  Flat, positive bowel sounds normal in frequency in pitch, no bruits, no rebound, no guarding, no midline pulsatile mass, no hepatomegaly, no splenomegaly EXT:  2 plus pulses throughout, no edema, no cyanosis no clubbing SKIN:  No rashes no nodules   EKG:  EKG is ordered today.    Sinus rhythm, rate 78, axis within normal limits, intervals within normal limits, no acute ST-T wave changes.   Recent Labs: 02/04/2015: TSH 0.929 02/07/2015: BUN 39*; Creatinine, Ser 6.40*; Potassium 4.5; Sodium 133* 05/04/2015: Hemoglobin 8.6 Repeated and verified X2.*; Platelets 536.0*    Lipid Panel    Component Value  Date/Time   CHOL 155 02/06/2015 0308   TRIG 111 02/06/2015 0308   HDL 58 02/06/2015 0308   CHOLHDL 2.7 02/06/2015 0308   VLDL 22 02/06/2015 0308   LDLCALC 75 02/06/2015 0308      Wt Readings from Last 3 Encounters:  08/26/15 191 lb (86.637 kg)  07/27/15 188 lb (85.276 kg)  05/13/15 188 lb (85.276 kg)      Other studies Reviewed: Additional studies/ records that were reviewed today include: GI records pulse ox results,  Review of the above records demonstrates:  Please see elsewhere in the note.  ASSESSMENT AND PLAN:   PAF: She was noted to be in AF with CVR. CHADS2-VASc=4. She is now on Coumadin which is monitored by PCP. She is in NSR today. She will continue on medications.   COUGH:  I don't suspect CHF but will check a BNP.    ESRD: MWF dialysis.  SLEEP APNEA : sleep apnea.   HYPERLIPIDEMIA: Continue Zetia.  HTN: No longer on medication. BP runs low at dialysis. No change in therapy.   Current medicines are reviewed at length with the patient today.  The patient does not have concerns regarding medicines.  The following changes have been made:  no change  Labs/ tests ordered today include:   BNP level No orders of the defined types were placed in this encounter.     Disposition:   FU with me in 12 months.     Signed, Minus Breeding, MD  08/26/2015 10:02 AM    [place]

## 2015-08-27 ENCOUNTER — Other Ambulatory Visit (HOSPITAL_COMMUNITY)
Admission: RE | Admit: 2015-08-27 | Discharge: 2015-08-27 | Disposition: A | Discharge status: Home / Self Care | Payer: Medicare Other | Source: Ambulatory Visit | Attending: Cardiology | Admitting: Cardiology

## 2015-08-27 DIAGNOSIS — R0602 Shortness of breath: Secondary | ICD-10-CM | POA: Insufficient documentation

## 2015-08-27 LAB — BRAIN NATRIURETIC PEPTIDE: B Natriuretic Peptide: 147 pg/mL — ABNORMAL HIGH (ref 0.0–100.0)

## 2015-08-28 ENCOUNTER — Telehealth: Payer: Self-pay | Admitting: Cardiology

## 2015-08-28 NOTE — Telephone Encounter (Signed)
Pt's dtr calling re pt's lab results from [facility] done yesterday-pls call [phone]

## 2015-08-28 NOTE — Telephone Encounter (Signed)
Returned call to daughter, ok per DPR. Let her know that once Dr Percival Spanish reads the results and evaluates them, his nurse would let them know. She verbalized understanding and said ok.

## 2015-09-01 ENCOUNTER — Ambulatory Visit (INDEPENDENT_AMBULATORY_CARE_PROVIDER_SITE_OTHER)
Admission: RE | Admit: 2015-09-01 | Discharge: 2015-09-01 | Disposition: A | Discharge status: Home / Self Care | Payer: Medicare Other | Source: Ambulatory Visit | Attending: Internal Medicine | Admitting: Internal Medicine

## 2015-09-01 ENCOUNTER — Encounter: Payer: Self-pay | Admitting: Internal Medicine

## 2015-09-01 ENCOUNTER — Ambulatory Visit (INDEPENDENT_AMBULATORY_CARE_PROVIDER_SITE_OTHER): Payer: Medicare Other | Admitting: Internal Medicine

## 2015-09-01 VITALS — BP 112/74 | HR 72 | Ht 65.0 in | Wt 187.6 lb

## 2015-09-01 DIAGNOSIS — R053 Chronic cough: Secondary | ICD-10-CM

## 2015-09-01 DIAGNOSIS — J45991 Cough variant asthma: Secondary | ICD-10-CM | POA: Insufficient documentation

## 2015-09-01 DIAGNOSIS — R05 Cough: Secondary | ICD-10-CM

## 2015-09-01 MED ORDER — PANTOPRAZOLE SODIUM 40 MG PO TBEC: 40.0000 mg | DELAYED_RELEASE_TABLET | Freq: Every day | ORAL | Status: DC

## 2015-09-01 MED ORDER — FAMOTIDINE 20 MG PO TABS: ORAL_TABLET | ORAL | Status: DC

## 2015-09-01 MED ORDER — PREDNISONE 10 MG PO TABS: ORAL_TABLET | ORAL | Status: DC

## 2015-09-01 NOTE — Patient Instructions (Signed)
Prednisone 10 mg take  4 each am x 2 days,   2 each am x 2 days,  1 each am x 2 days and stop   Pantoprazole (protonix) 40 mg   Take  30-60 min before first meal of the day and Pepcid (famotidine)  20 mg one @  bedtime until return to office - this is the best way to tell whether stomach acid is contributing to your problem.   GERD (REFLUX)  is an extremely common cause of respiratory symptoms just like yours , many times with no obvious heartburn at all.    It can be treated with medication, but also with lifestyle changes including elevation of the head of your bed (ideally with 6 inch  bed blocks),  Smoking cessation, avoidance of late meals, excessive alcohol, and avoid fatty foods, chocolate, peppermint, colas, red wine, and acidic juices such as orange juice.  NO MINT OR MENTHOL PRODUCTS SO NO COUGH DROPS  USE SUGARLESS CANDY INSTEAD (Jolley ranchers or Stover's or Life Savers) or even ice chips will also do - the key is to swallow to prevent all throat clearing. NO OIL BASED VITAMINS - use powdered substitutes.    Please remember to go to the  x-ray department downstairs for your tests - we will call you with the results when they are available.     Please schedule a follow up office visit in 4 weeks, sooner if needed

## 2015-09-01 NOTE — Progress Notes (Signed)
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 Pulmonary Consult    referred by Corliss Parish for wheezing, occ prod cough with yellow mucus, occ dyspnea x3 months.  denies any tightness, hemoptysis, f/c/s  indolent onset cough/wheeze x 3 months s assoc uri or nasal symptoms / no resp to saba and not changes related to pre HD or post HD days/nights   Most of the mucus is early in am and does wake up coughing / zpak /second abx x 10 by Dr Murrell Redden office > no better / no better saba / no relation to meals /swallowing/ not gagging at all   No obvious other patterns in day to day or daytime variabilty or assoc sob unless coughing (though note w/c bound due to knee pain at baseline) or cp or chest tightness, subjective wheeze overt sinus or hb symptoms. No unusual exp hx or h/o childhood pna/ asthma or knowledge of premature birth.  Sleeping ok without nocturnal  or early am exacerbation  of respiratory  c/o's or need for noct saba. Also denies any obvious fluctuation of symptoms with weather or environmental changes or other aggravating or alleviating factors except as outlined above   Current Medications, Allergies, Complete Past Medical History, Past Surgical History, Family History, and Social History were reviewed in [employer] record.          Review of Systems  Constitutional: Negative for fever and unexpected weight change.  HENT: Negative for congestion, dental problem, ear pain, nosebleeds, postnasal drip, rhinorrhea, sinus pressure, sneezing, sore throat and trouble swallowing.   Eyes: Negative for redness and itching.   Respiratory: Positive for cough. Negative for chest tightness, shortness of breath and wheezing.   Cardiovascular: Positive for palpitations. Negative for leg swelling.  Gastrointestinal: Negative for nausea and vomiting.  Genitourinary: Negative for dysuria.  Musculoskeletal: Negative for joint swelling.  Skin: Negative for rash.  Neurological: Negative for headaches.  Hematological: Does not bruise/bleed easily.  Psychiatric/Behavioral: Negative for dysphoric mood. The patient is not nervous/anxious.        Objective:   Physical Exam  Elderly wf sitting in w/c with congested/ rattling cough and prominent pseudowheeze better with plm   Wt Readings from Last 3 Encounters:  09/01/15 187 lb 9.6 oz (85.095 kg)  08/26/15 191 lb (86.637 kg)  07/27/15 188 lb (85.276 kg)    Vital signs reviewed  HEENT: edentulous/ nl  turbinates, and oropharynx. Nl external ear canals without cough reflex   NECK :  without JVD/Nodes/TM/ nl carotid upstrokes bilaterally   LUNGS: no acc muscle use,  Nl contour chest  with more upper than lower airway rhonci, better with plm and no cough on exp   CV:  RRR  no s3 or murmur or increase in P2, no edema   ABD:  soft and nontender with nl inspiratory excursion in the supine position. No bruits or organomegaly, bowel sounds nl  MS:  Nl gait/ ext warm without deformities, calf tenderness, cyanosis or clubbing No obvious joint restrictions   SKIN: warm and dry without lesions    NEURO:  alert, approp, nl sensorium with  no motor deficits    CXR PA and Lateral:   09/01/2015 :    I personally reviewed images and agree with radiology impression as follows:   No acute cardiopulmonary disease .      Assessment & Plan:

## 2015-09-01 NOTE — Assessment & Plan Note (Signed)
Onset 05/2015  - Spirometry 09/01/2015  wnl off all rx     The most common causes of chronic cough in immunocompetent adults include the following: upper airway cough syndrome (UACS), previously referred to as postnasal drip syndrome (PNDS), which is caused by variety of rhinosinus conditions; (2) asthma; (3) GERD; (4) chronic bronchitis from cigarette smoking or other inhaled environmental irritants; (5) nonasthmatic eosinophilic bronchitis; and (6) bronchiectasis.   These conditions, singly or in combination, have accounted for up to 94% of the causes of chronic cough in prospective studies.   Other conditions have constituted no >6% of the causes in prospective studies These have included bronchogenic carcinoma, chronic interstitial pneumonia, sarcoidosis, left ventricular failure, ACEI-induced cough, and aspiration from a condition associated with pharyngeal dysfunction.    Chronic cough is often simultaneously caused by more than one condition. A single cause has been found from 38 to 82% of the time, multiple causes from 18 to 62%. Multiply caused cough has been the result of three diseases up to 42% of the time.    Of the three most common causes of chronic cough, only one (GERD)  can actually cause the other two (asthma and post nasal drip syndrome)  and perpetuate the cylce of cough inducing airway trauma, inflammation, heightened sensitivity to reflux which is prompted by the cough itself via a cyclical mechanism.    This may partially respond to steroids and look like asthma and post nasal drainage but never erradicated completely unless the cough and the secondary reflux are eliminated, preferably both at the same time.  While not intuitively obvious, many patients with chronic low grade reflux do not cough until there is a secondary insult that disturbs the protective epithelial barrier and exposes sensitive nerve endings.  This can be viral or direct physical injury such as with an  endotracheal tube.   The point is that once this occurs, it is difficult to eliminate using anything but a maximally effective acid suppression regimen at least in the short run, accompanied by an appropriate diet to address non acid GERD.   rec pred x 6 days only in case there is AB component or Eos rhinitis/bronchitis and rx max gerd rx and f/u at 4 weeks  Total time devoted to counseling  = 35/62m review case with pt/daughter in law  discussion of options/alternatives/ personally creating written instructions  in presence of pt  then going over those specific  Instructions directly with the pt including how to use all of the meds but in particular covering each new medication in detail and the difference between the maintenance/automatic meds and the prns using an action plan format for the latter.

## 2015-09-02 ENCOUNTER — Telehealth: Payer: Self-pay | Admitting: Cardiology

## 2015-09-02 NOTE — Progress Notes (Signed)
Quick Note:  lmtcb ______ 

## 2015-09-02 NOTE — Telephone Encounter (Signed)
Pt's dtr-in-law Baldo Ash is calling back to speak with Dr. Rosezella Florida nurse, she voiced that she called and left a message yesterday after 5pm. Please f/u with her . She is healthcare POA.

## 2015-09-03 ENCOUNTER — Telehealth: Payer: Self-pay | Admitting: Internal Medicine

## 2015-09-03 NOTE — Telephone Encounter (Signed)
Result Notes     Notes Recorded by Rosana Berger, CMA on 09/02/2015 at 10:46 AM lmtcb ------  Notes Recorded by Tanda Rockers, MD on 09/02/2015 at 5:09 AM Call pt: Reviewed cxr and no acute change so no change in recommendations made at ov   Pt's daughter is aware of results. Nothing further was needed.

## 2015-09-05 ENCOUNTER — Emergency Department (HOSPITAL_COMMUNITY): Payer: Medicare Other

## 2015-09-05 ENCOUNTER — Encounter (HOSPITAL_COMMUNITY): Payer: Self-pay | Admitting: Emergency Medicine

## 2015-09-05 ENCOUNTER — Emergency Department (HOSPITAL_COMMUNITY)
Admission: EM | Admit: 2015-09-05 | Discharge: 2015-09-05 | Disposition: A | Discharge status: Home / Self Care | Payer: Medicare Other | Attending: Emergency Medicine | Admitting: Emergency Medicine

## 2015-09-05 DIAGNOSIS — N186 End stage renal disease: Secondary | ICD-10-CM | POA: Insufficient documentation

## 2015-09-05 DIAGNOSIS — Z79899 Other long term (current) drug therapy: Secondary | ICD-10-CM | POA: Insufficient documentation

## 2015-09-05 DIAGNOSIS — E119 Type 2 diabetes mellitus without complications: Secondary | ICD-10-CM | POA: Insufficient documentation

## 2015-09-05 DIAGNOSIS — I120 Hypertensive chronic kidney disease with stage 5 chronic kidney disease or end stage renal disease: Secondary | ICD-10-CM | POA: Diagnosis not present

## 2015-09-05 DIAGNOSIS — Z9851 Tubal ligation status: Secondary | ICD-10-CM | POA: Insufficient documentation

## 2015-09-05 DIAGNOSIS — Z7901 Long term (current) use of anticoagulants: Secondary | ICD-10-CM | POA: Insufficient documentation

## 2015-09-05 DIAGNOSIS — I959 Hypotension, unspecified: Secondary | ICD-10-CM | POA: Insufficient documentation

## 2015-09-05 DIAGNOSIS — Z96653 Presence of artificial knee joint, bilateral: Secondary | ICD-10-CM | POA: Insufficient documentation

## 2015-09-05 DIAGNOSIS — Z992 Dependence on renal dialysis: Secondary | ICD-10-CM | POA: Insufficient documentation

## 2015-09-05 DIAGNOSIS — I480 Paroxysmal atrial fibrillation: Secondary | ICD-10-CM | POA: Insufficient documentation

## 2015-09-05 HISTORY — DX: Disorder of kidney and ureter, unspecified: N28.9

## 2015-09-05 HISTORY — DX: Type 2 diabetes mellitus without complications: E11.9

## 2015-09-05 HISTORY — DX: Unspecified atrial fibrillation: I48.91

## 2015-09-05 LAB — CBC WITH DIFFERENTIAL/PLATELET
Basophils Absolute: 0 10*3/uL (ref 0.0–0.1)
Basophils Relative: 0 %
Eosinophils Absolute: 0.3 10*3/uL (ref 0.0–0.7)
Eosinophils Relative: 3 %
HCT: 35.7 % — ABNORMAL LOW (ref 36.0–46.0)
Hemoglobin: 11.3 g/dL — ABNORMAL LOW (ref 12.0–15.0)
Lymphocytes Relative: 18 %
Lymphs Abs: 1.6 10*3/uL (ref 0.7–4.0)
MCH: 29.3 pg (ref 26.0–34.0)
MCHC: 31.7 g/dL (ref 30.0–36.0)
MCV: 92.5 fL (ref 78.0–100.0)
Monocytes Absolute: 0.5 10*3/uL (ref 0.1–1.0)
Monocytes Relative: 6 %
Neutro Abs: 6.6 10*3/uL (ref 1.7–7.7)
Neutrophils Relative %: 73 %
Platelets: 236 10*3/uL (ref 150–400)
RBC: 3.86 MIL/uL — ABNORMAL LOW (ref 3.87–5.11)
RDW: 17 % — ABNORMAL HIGH (ref 11.5–15.5)
WBC: 9 10*3/uL (ref 4.0–10.5)

## 2015-09-05 LAB — BASIC METABOLIC PANEL
Anion gap: 9 (ref 5–15)
BUN: 26 mg/dL — ABNORMAL HIGH (ref 6–20)
CO2: 26 mmol/L (ref 22–32)
Calcium: 8.2 mg/dL — ABNORMAL LOW (ref 8.9–10.3)
Chloride: 102 mmol/L (ref 101–111)
Creatinine, Ser: 4.79 mg/dL — ABNORMAL HIGH (ref 0.44–1.00)
GFR calc Af Amer: 9 mL/min — ABNORMAL LOW (ref >60)
GFR calc non Af Amer: 8 mL/min — ABNORMAL LOW (ref >60)
Glucose, Bld: 91 mg/dL (ref 65–99)
Potassium: 3.3 mmol/L — ABNORMAL LOW (ref 3.5–5.1)
Sodium: 137 mmol/L (ref 135–145)

## 2015-09-05 LAB — I-STAT TROPONIN, ED: Troponin i, poc: 0.03 ng/mL (ref 0.00–0.08)

## 2015-09-05 MED ORDER — SODIUM CHLORIDE 0.9 % IV BOLUS (SEPSIS)
250.0000 mL | Freq: Once | INTRAVENOUS | Status: AC
  Administered 2015-09-05: 250 mL via INTRAVENOUS

## 2015-09-05 MED ORDER — MIDODRINE HCL 5 MG PO TABS
10.0000 mg | ORAL_TABLET | Freq: Three times a day (TID) | ORAL | Status: DC
  Administered 2015-09-05: 10 mg via ORAL
  Filled 2015-09-05: qty 2

## 2015-09-05 NOTE — ED Provider Notes (Signed)
CSN: [id number]     Arrival date & time 09/05/15  1132 History   First MD Initiated Contact with Patient 09/05/15 1137     Chief Complaint  Patient presents with  . Hypotension     (Consider location/radiation/quality/duration/timing/severity/associated sxs/prior Treatment) HPI  75 year old female on dialysis comes in today complaining of generalized weakness. She states that she had her usual dialysis with dark dry weight of 83 kg and was dialyzed 83 kg yesterday. She began having some generalized weakness yesterday but has felt more weak today. She denies any focal neurological deficits describing the weakness is just general in nature. She denies any headache, head injury, neck pain, chest pain, dyspnea, nausea, vomiting, diarrhea, fever, or chills Primary care physician is Dr. Edrick Oh in [place]  Past Medical History  Diagnosis Date  . Renal disorder   . A-fib ([place])   . Diabetes mellitus without complication ([place])    History reviewed. No pertinent past surgical history. No family history on file. Social History  Substance Use Topics  . Smoking status: None  . Smokeless tobacco: None  . Alcohol Use: None   OB History    No data available     Review of Systems  All other systems reviewed and are negative.     Allergies  Sulfa antibiotics  Home Medications   Prior to Admission medications   Not on File   BP 87/61 mmHg  Pulse 80  Resp 18  SpO2 99% Physical Exam  Constitutional: She is oriented to person, place, and time. She appears well-developed and well-nourished. No distress.  HENT:  Head: Normocephalic and atraumatic.  Right Ear: External ear normal.  Left Ear: External ear normal.  Nose: Nose normal.  Mouth/Throat: Oropharynx is clear and moist.  Eyes: Conjunctivae are normal. Pupils are equal, round, and reactive to light.  Neck: Normal range of motion. Neck supple.  Cardiovascular: An irregularly irregular rhythm present.  Pulmonary/Chest: Effort  normal and breath sounds normal.  Abdominal: Soft. Bowel sounds are normal. She exhibits no distension. There is no tenderness. There is no rebound and no guarding.  Musculoskeletal: Normal range of motion. She exhibits no edema or tenderness.  Neurological: She is alert and oriented to person, place, and time. She displays normal reflexes. No cranial nerve deficit. She exhibits normal muscle tone. Coordination normal.  Skin: Skin is warm and dry.  Psychiatric: She has a normal mood and affect.  Nursing note and vitals reviewed.   ED Course  Procedures (including critical care time) Labs Review Labs Reviewed  CBC WITH DIFFERENTIAL/PLATELET - Abnormal; Notable for the following:    RBC 3.86 (*)    Hemoglobin 11.3 (*)    HCT 35.7 (*)    RDW 17.0 (*)    All other components within normal limits  BASIC METABOLIC PANEL - Abnormal; Notable for the following:    Potassium 3.3 (*)    BUN 26 (*)    Creatinine, Ser 4.79 (*)    Calcium 8.2 (*)    GFR calc non Af Amer 8 (*)    GFR calc Af Amer 9 (*)    All other components within normal limits  I-STAT TROPOININ, ED    Imaging Review No results found. I have personally reviewed and evaluated these images and lab results as part of my medical decision-making.   EKG Interpretation   Date/Time:  Saturday September 05 2015 11:37:59 EDT Ventricular Rate:  75 PR Interval:    QRS Duration: 103 QT Interval:  416  QTC Calculation: 465 R Axis:   -43 Text Interpretation:  Atrial fibrillation Left ventricular hypertrophy  Confirmed by Micki Cassel MD, Andee Poles [id number]) on 09/05/2015 1:12:23 PM      MDM   Final diagnoses:  Hypotension, unspecified hypotension type    This is a 75 year old female on dialysis presents today with hypotension. After one IV bolus of 250 mL blood pressure is in the upper [id number] to 0000000 systolically. She currently runs a baseline in the low 90s. Electrolytes checked and have only mild abnormalities. No signs of infection or cardiac  problems. She is dosed here with midodorine as per her normal home dose. Plan discharge to home after second 250 mL bolus.    Pattricia Boss, MD 09/05/15 [phone]

## 2015-09-05 NOTE — Discharge Instructions (Signed)
Please return if worse at any time especially fever, chest pain, or shortness of breath.

## 2015-09-05 NOTE — ED Notes (Signed)
Per [facility] called out for sick call, patient recently treated for pneumonia.  On EMS arrival to patient home BP 80/60, EMS gave 552mL bolus.  Patient has dialysis MWF, receive full treatment yesterday.  Patient is alert and oriented to self and situation.

## 2015-09-07 ENCOUNTER — Encounter: Payer: Self-pay | Admitting: Internal Medicine

## 2015-09-07 NOTE — Telephone Encounter (Signed)
Spoke with pt Daughter Baldo Ash, result given

## 2015-10-01 ENCOUNTER — Ambulatory Visit (INDEPENDENT_AMBULATORY_CARE_PROVIDER_SITE_OTHER): Payer: Medicare Other | Admitting: Internal Medicine

## 2015-10-01 ENCOUNTER — Encounter: Payer: Self-pay | Admitting: Internal Medicine

## 2015-10-01 VITALS — BP 102/60 | HR 78 | Ht 65.0 in | Wt 189.0 lb

## 2015-10-01 DIAGNOSIS — R05 Cough: Secondary | ICD-10-CM | POA: Diagnosis not present

## 2015-10-01 DIAGNOSIS — R053 Chronic cough: Secondary | ICD-10-CM

## 2015-10-01 LAB — NITRIC OXIDE: Nitric Oxide: 87

## 2015-10-01 MED ORDER — PREDNISONE 10 MG PO TABS: ORAL_TABLET | ORAL | Status: DC

## 2015-10-01 MED ORDER — MOMETASONE FURO-FORMOTEROL FUM 100-5 MCG/ACT IN AERO: INHALATION_SPRAY | RESPIRATORY_TRACT | Status: DC

## 2015-10-01 NOTE — Patient Instructions (Addendum)
Prednisone 10 mg take  4 each am x 2 days,   2 each am x 2 days,  1 each am x 2 days and stop   Dulera 100 Take 2 puffs first thing in am and then another 2 puffs about 12 hours later.   Work on inhaler technique:  relax and gently blow all the way out then take a nice smooth deep breath back in, triggering the inhaler at same time you start breathing in.  Hold for up to 5 seconds if you can. Blow out thru nose. Rinse and gargle with water when done      No change on protonix / pepcid for now   Please schedule a follow up office visit in 4 weeks, sooner if needed

## 2015-10-01 NOTE — Progress Notes (Signed)
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 Pulmonary Consult    referred by Corliss Parish for wheezing, occ prod cough with yellow mucus, occ dyspnea x3 months.  denies any tightness, hemoptysis, f/c/s  indolent onset cough/wheeze x 3 months s assoc uri or nasal symptoms / no resp to saba and not changes related to pre HD or post HD days/nights  Most of the mucus is early in am and does wake up coughing / zpak /second abx x 10 by Dr Murrell Redden office > no better / no better saba / no relation to meals /swallowing/ not gagging at all  rec Prednisone 10 mg take  4 each am x 2 days,   2 each am x 2 days,  1 each am x 2 days and stop  Pantoprazole (protonix) 40 mg   Take  30-60 min before first meal of the day and Pepcid (famotidine)  20 mg one @  bedtime until return to office - this is the best way to tell whether stomach acid is contributing to your problem.  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 Follow-up    Cough had completely resolved, and then started back again approx 1 wk ago. Cough is prod with yellow sputum. She also c/o wheezing.   cough completely gone p prednisone after a few days then recurred p 3 weeks off it / esp severe in am's but does not typically wake her up  Wheezing some better with saba rx    No obvious day to day or daytime variability or assoc mucus plugs or hemoptysis or cp or chest tightness,  or overt sinus or hb symptoms. No unusual exp hx or h/o childhood pna/ asthma or knowledge of  premature birth.  Sleeping ok without nocturnal    exacerbation  of respiratory  c/o's or need for noct saba. Also denies any obvious fluctuation of symptoms with weather or environmental changes or other aggravating or alleviating factors except as outlined above   Current Medications, Allergies, Complete Past Medical History, Past Surgical History, Family History, and Social History were reviewed in [employer] record.  ROS  The following are not active complaints unless bolded sore throat, dysphagia, dental problems, itching, sneezing,  nasal congestion or excess/ purulent secretions, ear ache,   fever, chills, sweats, unintended wt loss, classically pleuritic or exertional cp,  orthopnea pnd or leg swelling, presyncope, palpitations, abdominal pain, anorexia, nausea, vomiting, diarrhea  or change in bowel or bladder habits, change in stools or urine, dysuria,hematuria,  rash, arthralgias, visual complaints, headache, numbness, weakness or ataxia or problems with walking or coordination,  change in mood/affect or memory.                Objective:   Physical Exam  Elderly wf sitting in w/c   Due to weak legs s/p bilateral bilateral tkr  10/01/2015        189   09/01/15 187 lb  9.6 oz (85.095 kg)  08/26/15 191 lb (86.637 kg)  07/27/15 188 lb (85.276 kg)    Vital signs reviewed  HEENT: edentulous/ nl  turbinates, and oropharynx. Nl external ear canals without cough reflex   NECK :  without JVD/Nodes/TM/ nl carotid upstrokes bilaterally   LUNGS: no acc muscle use,  Nl contour chest  With minimal insp and exp rhonchi and exp cough  CV:  RRR  no s3 or murmur or increase in P2, no edema   ABD:  soft and nontender with nl inspiratory excursion in the supine position. No bruits or organomegaly, bowel sounds nl  MS:  Nl gait/ ext warm without deformities, calf tenderness, cyanosis or clubbing No obvious joint restrictions   SKIN: warm and dry without lesions     NEURO:  alert, approp, nl sensorium with  no motor deficits    CXR PA and Lateral:   09/01/2015 :    I personally reviewed images and agree with radiology impression as follows:   No acute cardiopulmonary disease .      Assessment & Plan:

## 2015-10-02 NOTE — Assessment & Plan Note (Addendum)
Onset 05/2015  - Spirometry 09/01/2015  wnl off all rx   - max gerd rx 09/01/2015 >>>  - FENO  10/01/2015  = 87   High feno and Pos response to steroids strongly suggest eos airways inflammation either cough variant asthma or eos bronchitis so rec rechallenge with pred and start ics  - The proper method of use, as well as anticipated side effects, of a metered-dose inhaler are discussed and demonstrated to the patient. Improved effectiveness after extensive coaching during this visit to a level of approximately  75 % from a baseline of 25 %  So will need more work to optimize hfa  as unlikely to tol dry powdered ICS due to cough.  I had an extended discussion with the patient reviewing all relevant studies completed to date and  lasting 15 to 20 minutes of a 25 minute visit    Each maintenance medication was reviewed in detail including most importantly the difference between maintenance and prns and under what circumstances the prns are to be triggered using an action plan format that is not reflected in the computer generated alphabetically organized AVS.    Please see instructions for details which were reviewed in writing and the patient given a copy highlighting the part that I personally wrote and discussed at today's ov.

## 2015-10-29 ENCOUNTER — Ambulatory Visit: Payer: Medicare Other | Admitting: Internal Medicine

## 2015-11-02 ENCOUNTER — Telehealth: Payer: Self-pay | Admitting: Internal Medicine

## 2015-11-02 NOTE — Telephone Encounter (Signed)
Received fax from [facility] / CoverMyMeds reporting that [facility] 100 is not covered by pt's insurance  Per the received fax, on 7.26.17 [name] "tried to call pharmacy to get alternative.  Line busy. MG" [place] and spoke with [name] who reported covered alternative is Advair diskus  MW is not in the office this week to address [facility] TCB x1 for pt to discuss or offer sample if needed until MW can address

## 2015-11-03 ENCOUNTER — Telehealth: Payer: Self-pay | Admitting: *Deleted

## 2015-11-03 ENCOUNTER — Encounter: Payer: Self-pay | Admitting: Cardiology

## 2015-11-03 NOTE — Telephone Encounter (Signed)
LMTCB for pt 

## 2015-11-03 NOTE — Telephone Encounter (Signed)
Recommend she use the inhaler her insurance will cover instead of Dulera.  Rx Advair 250/50, # 1   Inhale 1 puff then rinse mouth, twice daily, refill x 6

## 2015-11-03 NOTE — Telephone Encounter (Signed)
lmtcb

## 2015-11-03 NOTE — Telephone Encounter (Signed)
Walk in for surgical clearance, was told to walk over from [place] for surgical clearance:  1. What type of surgery is being performed? Shoulder surgery   2. When is this surgery scheduled? TBD   3. Are there any medications that need to be held prior to surgery and how long?Warfarin   4. Name of physician performing surgery? Dr Veverly Fells   5. What is your office phone and fax number? Phone [phone] fax [phone]  Will forward to Dr Percival Spanish for review

## 2015-11-03 NOTE — Telephone Encounter (Signed)
Spoke with pt's daughter Baldo Ash. She states that pt did well on Dulera and cough is almost gone. She was given sample at [facility] 10/01/15 but has been out for about 2 weeks. Pt has been doing well without Dulera. Daughter denies SOB/wheeze and reports only occasional cough.  Per pt's insurance they will not cover Dulera. The alternative is Advair Diskus.    MW is out of office. CY - Please advise. Thanks!   Allergies  Allergen Reactions  . Sulfa Drugs Cross Reactors Hives and Itching  . Sulfa Antibiotics   . Amoxicillin Rash  . Percocet [Oxycodone-Acetaminophen] Itching and Rash    Did not happen last time she took it    Current Outpatient Prescriptions on File Prior to Visit  Medication Sig Dispense Refill  . ACCU-CHEK AVIVA PLUS test strip     . acetaminophen (TYLENOL) 325 MG tablet Take 650 mg by mouth every 6 (six) hours as needed for mild pain.    Marland Kitchen albuterol (PROVENTIL HFA;VENTOLIN HFA) 108 (90 Base) MCG/ACT inhaler Inhale 2 puffs into the lungs every 6 (six) hours as needed for wheezing or shortness of breath.    . colchicine 0.6 MG tablet TAKE ONE TABLET BY MOUTH EVERY DAY    . Darbepoetin Alfa (ARANESP) 60 MCG/0.3ML SOSY injection Inject 0.3 mLs (60 mcg total) into the vein every Thursday with hemodialysis.    Marland Kitchen doxercalciferol (HECTOROL) 0.5 MCG capsule Take 0.5 mcg by mouth 3 (three) times a week. Every Mon / Wed / Fri  w/dialysis    . ezetimibe (ZETIA) 10 MG tablet Take 10 mg by mouth daily.     . famotidine (PEPCID) 20 MG tablet One at bedtime 30 tablet 2  . gabapentin (NEURONTIN) 100 MG capsule Take 100 mg by mouth at bedtime.     Marland Kitchen levothyroxine (SYNTHROID, LEVOTHROID) 100 MCG tablet Take 100 mcg by mouth daily.      . midodrine (PROAMATINE) 10 MG tablet Take 10 mg by mouth. Take on Tuesdays, Thursdays and Saturday before dialysis    . mometasone-formoterol (DULERA) 100-5 MCG/ACT AERO Take 2 puffs first thing in am and then another 2 puffs about 12 hours later. 1 Inhaler 11   . multivitamin (RENA-VIT) TABS tablet Take 1 tablet by mouth daily.    . pantoprazole (PROTONIX) 40 MG tablet Take 1 tablet (40 mg total) by mouth daily. Take 30-60 min before first meal of the day 30 tablet 2  . polyethylene glycol (MIRALAX / GLYCOLAX) packet Take 17 g by mouth daily. 14 each 0  . predniSONE (DELTASONE) 10 MG tablet Take  4 each am x 2 days,   2 each am x 2 days,  1 each am x 2 days and stop 14 tablet 0  . Vitamin D, Ergocalciferol, (DRISDOL) 50000 UNITS CAPS Take 50,000 Units by mouth every Monday.     . warfarin (COUMADIN) 5 MG tablet Take 7.5mg  daily on M-W-F abd then 5 mg daily the rest of the week (Patient taking differently: Take 5 mg by mouth daily at 6 PM. Take 7.5mg  daily on M-W-F abd then 5 mg daily the rest of the week) 45 tablet 1   No current facility-administered medications on file prior to visit.

## 2015-11-04 NOTE — Telephone Encounter (Signed)
The patient recently had no high risk findings or complaints.  She is not going for a high risk finding from a cardiovascular standpoint.  Therefore, based on ACC/AHA guidelines, the patient would be at acceptable risk for the planned procedure without further cardiovascular testing.  She can hold her warfarin for five days prior to the procedure.  No bridging is necessary.

## 2015-11-04 NOTE — Telephone Encounter (Signed)
[name] and will forward to Dr Veverly Fells

## 2015-11-05 ENCOUNTER — Other Ambulatory Visit: Payer: Self-pay | Admitting: Orthopedic Surgery

## 2015-11-05 DIAGNOSIS — M25511 Pain in right shoulder: Secondary | ICD-10-CM

## 2015-11-06 NOTE — Telephone Encounter (Signed)
LMOM TCB x3 for pt Per office protocol, we have attempted to contact patient 3 times with no return call Will sign off on note If patient calls again, we can forward message to MW to address medication prior authorization

## 2015-11-09 ENCOUNTER — Telehealth: Payer: Self-pay | Admitting: Internal Medicine

## 2015-11-09 NOTE — Telephone Encounter (Signed)
Attempted to contact patient, left message for patient to return call.

## 2015-11-10 NOTE — Telephone Encounter (Signed)
Pt returned call.  Spoke with patient and informed her that her insurance will not cover [facility] and that we are working to either find a covered alternative or get it approved.  Pt okay with this and voiced her understanding.  Called pt's plan and spoke with Noreene Filbert to ask for covered alternatives.  Per Noreene Filbert there are no covered alternatives, Dulera just requires a PA.  Attempted to do this over the phone > was sent to pharmacist for clinical review.  Will await decision.

## 2015-11-10 NOTE — Telephone Encounter (Signed)
Called and lmomtcb for charlotte---pts daughter.  Wanted to make her aware that per last OV note, her insurance will not cover the dulera and this has been changed to advair and this was sent to her pharmacy.

## 2015-11-11 ENCOUNTER — Telehealth: Payer: Self-pay | Admitting: *Deleted

## 2015-11-11 NOTE — Telephone Encounter (Signed)
Requesting surgical clearance:   1. Type of surgery: Left Shoulder: left reverse TSA  2. Surgeon: Esmond Plants  3. Surgical date: Pending  4. Medications that need to be help: Warfarin   5. [facility]: Mamie [phone]  Is pt cleared for surgery? Dr Dione Housekeeper is managing pt Warfarin

## 2015-11-12 ENCOUNTER — Ambulatory Visit
Admission: RE | Admit: 2015-11-12 | Discharge: 2015-11-12 | Disposition: A | Discharge status: Home / Self Care | Payer: Medicare Other | Source: Ambulatory Visit | Attending: Orthopedic Surgery | Admitting: Orthopedic Surgery

## 2015-11-12 DIAGNOSIS — M25511 Pain in right shoulder: Secondary | ICD-10-CM

## 2015-11-12 NOTE — Telephone Encounter (Signed)
LMTCB for [name]

## 2015-11-12 NOTE — Telephone Encounter (Signed)
advair 115 is fine if she already has it  But if not rec symb 80 2bid which is closer to the dulera 100

## 2015-11-12 NOTE — Telephone Encounter (Signed)
Denial was received for pt [name]. It is not FDA approved for pt diagnosis. No alternatives provided. Please advise MW thanks

## 2015-11-13 MED ORDER — BUDESONIDE-FORMOTEROL FUMARATE 80-4.5 MCG/ACT IN AERO: 2.0000 | INHALATION_SPRAY | Freq: Two times a day (BID) | RESPIRATORY_TRACT | 0 refills | Status: DC

## 2015-11-13 NOTE — Telephone Encounter (Signed)
Called and lmomtcb for [name] x 1.

## 2015-11-13 NOTE — Telephone Encounter (Signed)
Baldo Ash, patient's daughter returned call, CB is cell # [phone].

## 2015-11-13 NOTE — Telephone Encounter (Signed)
Spoke with pt's daughter Baldo Ash, aware of recs.  Pt never picked up advair, so they would like to try symbicort.  Sample left up front for pt to try at pt's daughter's request. Nothing further needed.

## 2015-11-15 NOTE — Telephone Encounter (Signed)
OK to hold warfarin for 5 days prior to the procedure.  She has no high risk active cardiovascular findings.  No cardiovascular contraindication for surgery.

## 2015-11-16 NOTE — Telephone Encounter (Signed)
Message routed to Dr. Veverly Fells' office via [facility]

## 2015-11-19 ENCOUNTER — Encounter: Payer: Self-pay | Admitting: Internal Medicine

## 2015-11-19 ENCOUNTER — Ambulatory Visit (INDEPENDENT_AMBULATORY_CARE_PROVIDER_SITE_OTHER): Payer: Medicare Other | Admitting: Internal Medicine

## 2015-11-19 VITALS — BP 116/60 | HR 71 | Ht 65.0 in | Wt 189.0 lb

## 2015-11-19 DIAGNOSIS — R053 Chronic cough: Secondary | ICD-10-CM

## 2015-11-19 DIAGNOSIS — J45991 Cough variant asthma: Secondary | ICD-10-CM

## 2015-11-19 DIAGNOSIS — R05 Cough: Secondary | ICD-10-CM | POA: Diagnosis not present

## 2015-11-19 LAB — NITRIC OXIDE: Nitric Oxide: 28

## 2015-11-19 MED ORDER — MOMETASONE FURO-FORMOTEROL FUM 100-5 MCG/ACT IN AERO: 2.0000 | INHALATION_SPRAY | Freq: Two times a day (BID) | RESPIRATORY_TRACT | 0 refills | Status: DC

## 2015-11-19 MED ORDER — PREDNISONE 10 MG PO TABS: ORAL_TABLET | ORAL | 0 refills | Status: DC

## 2015-11-19 NOTE — Patient Instructions (Addendum)
Pantoprazole (protonix) 40 mg   Take  30-60 min before first meal of the day and Pepcid (famotidine)  20 mg one @  bedtime until return to office -    Prednisone 10 mg Take 4 for two days three for two days two for two days one for two days   If cough flares again you can try the dulera 100 2 every 12 hours or symbicort 80 in its place for at least 2 weeks to see what benefit if any you notice   Only use your albuterol as a back up if cough/ wheeze/ short of breath - The less you use it, the better it will work when you need it. - Ok to use up to 2 puffs  every 4 hours if you must but call for immediate appointment if use goes up over your usual need - Don't leave home without it !!  (think of it like the spare tire for your car)   Please schedule a follow up office visit in 4 weeks, sooner if needed with all meds/inhalers in hand

## 2015-11-19 NOTE — Progress Notes (Signed)
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 Pulmonary Consult    referred by Corliss Parish for wheezing, occ prod cough with yellow mucus, occ dyspnea x3 months.  denies any tightness, hemoptysis, f/c/s  indolent onset cough/wheeze x 3 months s assoc uri or nasal symptoms / no resp to saba and not changes related to pre HD or post HD days/nights  Most of the mucus is early in am and does wake up coughing / zpak /second abx x 10 by Dr Murrell Redden office > no better / no better saba / no relation to meals /swallowing/ not gagging at all  rec Prednisone 10 mg take  4 each am x 2 days,   2 each am x 2 days,  1 each am x 2 days and stop  Pantoprazole (protonix) 40 mg   Take  30-60 min before first meal of the day and Pepcid (famotidine)  20 mg one @  bedtime until return to office - this is the best way to tell whether stomach acid is contributing to your problem.  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 Follow-up    Cough had completely resolved, and then started back again approx 1 wk ago. Cough is prod with yellow sputum. She also c/o wheezing.   cough completely gone p prednisone after a few days then recurred p 3 weeks off it / esp severe in am's but does not typically wake her up  Wheezing some better with saba rx   rec Prednisone 10 mg take  4 each am x 2 days,   2 each am x 2 days,  1 each am x 2 days and stop  Dulera 100 Take 2 puffs first thing in am and then another 2 puffs about 12 hours later.  Work on inhaler  technique: No change on protonix / pepcid for now    11/19/2015  f/u ov/[name] re: cough variant  Chief Complaint  Patient presents with  . Follow-up    cough had resolved until pt started using Symbicort last week- cough is prod with yellow mucus.   was doing better, was not needing much albuterol at all for the month July s any maint rx due to insurance issues  After started on symbicort 2bid  Stopped needing  the albuterol for sob No noct cough  No longer using gerd rx   No obvious day to day or daytime variability or assoc excess/ purulent sputum or mucus plugs or hemoptysis or cp or chest tightness, subjective wheeze or overt sinus or hb symptoms. No unusual exp hx or h/o childhood pna/ asthma or knowledge of premature birth.  Sleeping ok without nocturnal  or early am exacerbation  of respiratory  c/o's or need for noct saba. Also denies any obvious fluctuation of symptoms with weather or environmental changes or other aggravating or alleviating factors except as outlined above   Current Medications, Allergies, Complete Past Medical History, Past Surgical  History, Family History, and Social History were reviewed in [employer] record.  ROS  The following are not active complaints unless bolded sore throat, dysphagia, dental problems, itching, sneezing,  nasal congestion or excess/ purulent secretions, ear ache,   fever, chills, sweats, unintended wt loss, classically pleuritic or exertional cp,  orthopnea pnd or leg swelling, presyncope, palpitations, abdominal pain, anorexia, nausea, vomiting, diarrhea  or change in bowel or bladder habits, change in stools or urine, dysuria,hematuria,  rash, arthralgias, visual complaints, headache, numbness, weakness or ataxia or problems with walking or coordination,  change in mood/affect or memory.                     Objective:  Physical Exam  Elderly wf sitting in w/c Due to weak legs s/p bilateral bilateral tkr    10/01/2015        189   09/01/15 187 lb 9.6 oz (85.095 kg)  08/26/15 191 lb (86.637 kg)  07/27/15 188 lb (85.276 kg)    Vital signs reviewed  HEENT: edentulous/ nl  turbinates, and oropharynx. Nl external ear canals without cough reflex   NECK :  without JVD/Nodes/TM/ nl carotid upstrokes bilaterally   LUNGS: no acc muscle use,  Nl contour chest  With minimal insp and exp rhonchi with end  cough  CV:  RRR  no s3 or murmur or increase in P2, no edema   ABD:  soft and nontender with nl inspiratory excursion in the supine position. No bruits or organomegaly, bowel sounds nl  MS:  Nl gait/ ext warm without deformities, calf tenderness, cyanosis or clubbing No obvious joint restrictions   SKIN: warm and dry without lesions    NEURO:  alert, approp, nl sensorium with  no motor deficits           Assessment & Plan:

## 2015-11-23 NOTE — Assessment & Plan Note (Signed)
Onset 05/2015  - Spirometry 09/01/2015  wnl off all rx   - max gerd rx 09/01/2015 >>>  - FENO  10/01/2015  = 87  - 10/01/2015  After extensive coaching HFA effectiveness =    75% > try dulera 100 2bid  - FENO 11/19/2015  =  28   Makes no sense that dulera 100 2bid worked for the cough but the symb 80 did not and suspect other factors here, especially gerd may be also be a factor.   The advantage of using dulera or symbicort is that they work so quickly we can wait and see if the cough flares again p rx :  Prednisone 10 mg take  4 each am x 2 days,   2 each am x 2 days,  1 each am x 2 days and stop Max gerd RX If flares rechallenge first with dulera 100 samples then change again to symbicort 80 2bid while this time maintaining on symb  - The proper method of use, as well as anticipated side effects, of a metered-dose inhaler are discussed and demonstrated to the patient. Improved effectiveness after extensive coaching during this visit to a level of approximately 75 % from a baseline of 50 %    I had an extended discussion with the patient reviewing all relevant studies completed to date and  lasting 15 to 20 minutes of a 25 minute visit    Each maintenance medication was reviewed in detail including most importantly the difference between maintenance and prns and under what circumstances the prns are to be triggered using an action plan format that is not reflected in the computer generated alphabetically organized AVS.    Please see instructions for details which were reviewed in writing and the patient given a copy highlighting the part that I personally wrote and discussed at today's ov.

## 2015-12-09 ENCOUNTER — Other Ambulatory Visit: Payer: Self-pay | Admitting: Internal Medicine

## 2015-12-09 DIAGNOSIS — R053 Chronic cough: Secondary | ICD-10-CM

## 2015-12-09 DIAGNOSIS — R05 Cough: Secondary | ICD-10-CM

## 2015-12-09 MED ORDER — FAMOTIDINE 20 MG PO TABS: ORAL_TABLET | ORAL | 0 refills | Status: DC

## 2015-12-17 ENCOUNTER — Encounter: Payer: Self-pay | Admitting: Internal Medicine

## 2015-12-17 ENCOUNTER — Ambulatory Visit (INDEPENDENT_AMBULATORY_CARE_PROVIDER_SITE_OTHER): Payer: Medicare Other | Admitting: Internal Medicine

## 2015-12-17 VITALS — BP 124/66 | HR 78 | Ht 65.0 in | Wt 195.6 lb

## 2015-12-17 DIAGNOSIS — J45991 Cough variant asthma: Secondary | ICD-10-CM

## 2015-12-17 DIAGNOSIS — R053 Chronic cough: Secondary | ICD-10-CM

## 2015-12-17 DIAGNOSIS — R05 Cough: Secondary | ICD-10-CM

## 2015-12-17 LAB — NITRIC OXIDE: Nitric Oxide: 62

## 2015-12-17 MED ORDER — PANTOPRAZOLE SODIUM 40 MG PO TBEC: 40.0000 mg | DELAYED_RELEASE_TABLET | Freq: Every day | ORAL | 11 refills | Status: DC

## 2015-12-17 MED ORDER — BUDESONIDE-FORMOTEROL FUMARATE 80-4.5 MCG/ACT IN AERO: INHALATION_SPRAY | RESPIRATORY_TRACT | Status: DC

## 2015-12-17 NOTE — Patient Instructions (Signed)
Continue Pantoprazole (protonix) 40 mg   Take  30-60 min before first meal of the day and Pepcid (famotidine)  20 mg one @  bedtime until return to office - this is the best way to tell whether stomach acid is contributing to your problem.     At the first sign of any increase in cough/ wheeze or need for albuterol restart the symbicort 80 Take 2 puffs first thing in am and then another 2 puffs about 12 hours later.      If you are satisfied with your treatment plan,  let your doctor know and he/she can either refill your medications or you can return here when your prescription runs out.     If in any way you are not 100% satisfied,  please tell us.  If 100% better, tell your friends!  Pulmonary follow up is as needed

## 2015-12-17 NOTE — Progress Notes (Signed)
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 Pulmonary Consult    referred by Corliss Parish for wheezing, occ prod cough with yellow mucus, occ dyspnea x3 months.  denies any tightness, hemoptysis, f/c/s  indolent onset cough/wheeze x 3 months s assoc uri or nasal symptoms / no resp to saba and not changes related to pre HD or post HD days/nights  Most of the mucus is early in am and does wake up coughing / zpak /second abx x 10 by Dr Murrell Redden office > no better / no better saba / no relation to meals /swallowing/ not gagging at all  rec Prednisone 10 mg take  4 each am x 2 days,   2 each am x 2 days,  1 each am x 2 days and stop  Pantoprazole (protonix) 40 mg   Take  30-60 min before first meal of the day and Pepcid (famotidine)  20 mg one @  bedtime until return to office - this is the best way to tell whether stomach acid is contributing to your problem.  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 Follow-up    Cough had completely resolved, and then started back again approx 1 wk ago. Cough is prod with yellow sputum. She also c/o wheezing.   cough completely gone p prednisone after a few days then recurred p 3 weeks off it / esp severe in am's but does not typically wake her up  Wheezing some better with saba rx   rec Prednisone 10 mg take  4 each am x 2 days,   2 each am x 2 days,  1 each am x 2 days and stop  Dulera 100 Take 2 puffs first thing in am and then another 2 puffs about 12 hours later.  Work on inhaler  technique: No change on protonix / pepcid for now    11/19/2015  f/u ov/Wert re: cough variant  Chief Complaint  Patient presents with  . Follow-up    cough had resolved until pt started using Symbicort last week- cough is prod with yellow mucus.   was doing better, was not needing much albuterol at all for the month July s any maint rx due to insurance issues  After started on symbicort 2bid  Stopped needing  the albuterol for sob No noct cough  No longer using gerd rx  rec Pantoprazole (protonix) 40 mg   Take  30-60 min before first meal of the day and Pepcid (famotidine)  20 mg one @  bedtime until return to office -   Prednisone 10 mg Take 4 for two days three for two days two for two days one for two days  If cough flares again you can try the dulera 100 2 every 12 hours or symbicort 80 in its place for at least 2 weeks to see what benefit if any you notice  Only use your albuterol as a back up if cough/  wheeze/ short of breath    12/17/2015  f/u ov/Wert re: cough variant asthma  Off symbicort 80 2bid  X 2 weeks still maint on gerd rx  Chief Complaint  Patient presents with  . Follow-up    pt doing well, c/o prod cough with stringy yellow mucus X 2-3 wks.       No obvious day to day or daytime variability or assoc excess/ purulent sputum or mucus plugs or hemoptysis or cp or chest tightness, subjective wheeze or overt sinus or hb symptoms. No unusual exp hx or h/o childhood pna/ asthma or knowledge of premature birth.  Sleeping ok without nocturnal  or early am exacerbation  of respiratory  c/o's or need for noct saba. Also denies any obvious fluctuation of symptoms with weather or environmental changes or other aggravating or alleviating factors except as outlined above   Current Medications, Allergies, Complete Past Medical History, Past Surgical History, Family History, and Social History were reviewed in [employer] record.  ROS  The following are not  active complaints unless bolded sore throat, dysphagia, dental problems, itching, sneezing,  nasal congestion or excess/ purulent secretions, ear ache,   fever, chills, sweats, unintended wt loss, classically pleuritic or exertional cp,  orthopnea pnd or leg swelling, presyncope, palpitations, abdominal pain, anorexia, nausea, vomiting, diarrhea  or change in bowel or bladder habits, change in stools or urine, dysuria,hematuria,  rash, arthralgias, visual complaints, headache, numbness, weakness or ataxia or problems with walking or coordination,  change in mood/affect or memory.                     Objective:  Physical Exam  Elderly wf sitting in rolater  Due to weak legs s/p bilateral bilateral tkr    12/17/2015        195  10/01/2015        189   09/01/15 187 lb 9.6 oz (85.095 kg)  08/26/15 191 lb (86.637 kg)  07/27/15 188 lb (85.276 kg)    Vital signs reviewed  HEENT: edentulous/ nl  turbinates, and oropharynx. Nl external ear canals without cough reflex   NECK :  without JVD/Nodes/TM/ nl carotid upstrokes bilaterally   LUNGS: no acc muscle use,  Nl contour chest  With clear bilaterally to A and P   CV:  RRR  no s3 or murmur or increase in P2, no edema   ABD:  soft and nontender with nl inspiratory excursion in the supine position. No bruits or organomegaly, bowel sounds nl  MS:  Nl gait/ ext warm without deformities, calf tenderness, cyanosis or clubbing No obvious joint restrictions   SKIN: warm and dry without lesions    NEURO:  alert, approp, nl sensorium with  no motor deficits       I personally reviewed images and agree with radiology impression as follows:  CXR:   09/05/15  No active cardiopulmonary disease.      Assessment & Plan:   Outpatient Encounter Prescriptions as of 12/17/2015  Medication Sig  . ACCU-CHEK AVIVA PLUS test strip   . albuterol (PROVENTIL HFA;VENTOLIN HFA) 108 (90 Base) MCG/ACT inhaler Inhale 2 puffs into the lungs every 6 (six)  hours as needed for wheezing or shortness of breath.  . budesonide-formoterol (SYMBICORT) 80-4.5 MCG/ACT inhaler Inhale 2 puffs into the lungs 2 (two) times daily.  . cinacalcet (SENSIPAR) 30 MG tablet Take 30 mg by mouth every other day.  . colchicine 0.6 MG tablet TAKE ONE TABLET  BY MOUTH EVERY DAY  . Darbepoetin Alfa (ARANESP) 60 MCG/0.3ML SOSY injection Inject 0.3 mLs (60 mcg total) into the vein every Thursday with hemodialysis.  Marland Kitchen doxercalciferol (HECTOROL) 0.5 MCG capsule Take 0.5 mcg by mouth 3 (three) times a week. Every Mon / Wed / Fri  w/dialysis  . ezetimibe (ZETIA) 10 MG tablet Take 10 mg by mouth daily.   . famotidine (PEPCID) 20 MG tablet One at bedtime  . gabapentin (NEURONTIN) 100 MG capsule Take 100 mg by mouth at bedtime.   Marland Kitchen levothyroxine (SYNTHROID, LEVOTHROID) 100 MCG tablet Take 100 mcg by mouth daily.    . midodrine (PROAMATINE) 10 MG tablet Take 10 mg by mouth 3 (three) times daily.   . multivitamin (RENA-VIT) TABS tablet Take 1 tablet by mouth daily.  . pantoprazole (PROTONIX) 40 MG tablet Take 1 tablet (40 mg total) by mouth daily. Take 30-60 min before first meal of the day  . polyethylene glycol (MIRALAX / GLYCOLAX) packet Take 17 g by mouth daily.  . Vitamin D, Ergocalciferol, (DRISDOL) 50000 UNITS CAPS Take 50,000 Units by mouth every Monday.   . warfarin (COUMADIN) 5 MG tablet Take 7.5mg  daily on M-W-F abd then 5 mg daily the rest of the week (Patient taking differently: Take 5 mg by mouth daily at 6 PM. Take 7.5mg  every day but Wednesday, when she takes 5mg )  . [DISCONTINUED] mometasone-formoterol (DULERA) 100-5 MCG/ACT AERO Inhale 2 puffs into the lungs 2 (two) times daily.  . [DISCONTINUED] pantoprazole (PROTONIX) 40 MG tablet Take 1 tablet (40 mg total) by mouth daily. Take 30-60 min before first meal of the day  . acetaminophen (TYLENOL) 325 MG tablet Take 650 mg by mouth every 6 (six) hours as needed for mild pain.  . budesonide-formoterol (SYMBICORT) 80-4.5  MCG/ACT inhaler Take 2 puffs first thing in am and then another 2 puffs about 12 hours later.   as needed  . [DISCONTINUED] predniSONE (DELTASONE) 10 MG tablet Take 4 for two days three for two days two for two days one for two days (Patient not taking: Reported on 12/17/2015)   No facility-administered encounter medications on file as of 12/17/2015.

## 2015-12-20 ENCOUNTER — Encounter: Payer: Self-pay | Admitting: Internal Medicine

## 2015-12-20 NOTE — Assessment & Plan Note (Addendum)
Onset 05/2015  - Spirometry 09/01/2015  wnl off all rx   - max gerd rx 09/01/2015 >>>  - FENO  10/01/2015  = 87  - 10/01/2015  After extensive coaching HFA effectiveness =    75% > try dulera 100 2bid  - FENO 11/19/2015  =  28 on symbicort 80 - FENO 12/17/2015  =  62 off symb x 2 weeks    FENO suggests she should be back on some form of maint rx but she's not convinced at this point > ok to hold off until more symptoms as long as she remembers to restart immediately at onset of any flares   I had an extended summary /final discussion with the patient reviewing all relevant studies completed to date and  lasting 15 to 20 minutes of a 25 minute visit    Each maintenance medication was reviewed in detail including most importantly the difference between maintenance and prns and under what circumstances the prns are to be triggered using an action plan format that is not reflected in the computer generated alphabetically organized AVS.    Please see instructions for details which were reviewed in writing and the patient given a copy highlighting the part that I personally wrote and discussed at today's ov.   Pulmonary f/u can be prn

## 2015-12-28 ENCOUNTER — Telehealth: Payer: Self-pay | Admitting: Cardiology

## 2015-12-28 NOTE — Telephone Encounter (Signed)
Dr Stanford Breed spoke with Michelle Harman brown pa at the [facility] center regarding this pt. The pt is currently at dialysis and she felt her pulse and it was 44 and then went into the 50's. Her bp uis 155/57 and then after dialysis dropped to 80. The pt is completely asymptomatic. They are concerned and ask for the pt to be seen. Follow up scheduled with pa.

## 2015-12-29 ENCOUNTER — Ambulatory Visit (INDEPENDENT_AMBULATORY_CARE_PROVIDER_SITE_OTHER): Payer: Medicare Other | Admitting: Physician Assistant

## 2015-12-29 ENCOUNTER — Encounter: Payer: Self-pay | Admitting: Physician Assistant

## 2015-12-29 VITALS — HR 86 | Ht 65.0 in | Wt 195.0 lb

## 2015-12-29 DIAGNOSIS — I482 Chronic atrial fibrillation: Secondary | ICD-10-CM | POA: Diagnosis not present

## 2015-12-29 DIAGNOSIS — I4821 Permanent atrial fibrillation: Secondary | ICD-10-CM

## 2015-12-29 DIAGNOSIS — Z7901 Long term (current) use of anticoagulants: Secondary | ICD-10-CM | POA: Diagnosis not present

## 2015-12-29 NOTE — Patient Instructions (Signed)
Medications:  Your physician recommends that you continue on your current medications as directed. Please refer to the Current Medication list given to you today.   Other Instructions:  Continue to monitor your Heart Rate especially during exertion and increasing dry weight at dialysis as needed.  We will decide on surgery based on exercise tolerance when it is closer to time.   Follow-Up:  Your physician recommends that you schedule a follow-up appointment in: January 19, 2016 at 8:30 am with Rosaria Ferries, PA-C. If you need a refill on your cardiac medications before your next appointment, please call your pharmacy.

## 2015-12-29 NOTE — Progress Notes (Signed)
Cardiology Office Note   Date:  12/29/2015   ID:  [name], DOB 09-19-1940, MRN [id number]  PCP:  Sherrie Mustache, MD  Cardiologist:  Dr Mardene Celeste, PA-C   Chief Complaint  Patient presents with  . Follow-up    pt c/o  heart rate at dialysis went down to 44 and as high as 126 this morning with walking     History of Present Illness: [name] is a 75 y.o. female with a history of ESRD on HD, cough variant asthma (?GERD component), DM, afib on coumadin, HTN, HLD, hypothyroid, nl EF by echo 02/2015  At HD, pt pulse noted to be 44 and in the 50s, not hypotensive at first but BP dropped after HD w/ SBP 80s. O.v. Scheduled.  [name] presents for assessment of her cardiac issues.  She has problems with dyspnea on exertion and getting lightheaded when she exerts herself. Her daughter has checked her blood pressure and heart rate when she was trying to ambulate. She got a heart rate greater than 125 when she was trying to ambulate, she felt short of breath and lightheaded with this. This was after dialysis.  The symptoms may be a little bit better on the day before dialysis, but she has still been having blood pressure problems. She is compliant with midodrine. This helps her great deal.   She has been noted to have some bradycardia at dialysis. She is asymptomatic with bradycardia. Her heart rate will drop into the 40s intermittently, but she does not get any shortness of breath, dizziness or lightheaded feeling with this. She has no history of presyncope except in the setting of a rapid heart rate.   When her blood pressure drops low at dialysis, they get her to drink a glass of water, and her symptoms will improve. Initially, her blood pressure was hard to ascertain here. However, we were able to palpate It successfully at 92 systolic. She is resting comfortably with this blood pressure.  Her daughter documented her heart rates through the night last night  at intervals. The lowest one is 46, the highest 99. She was resting comfortably overnight. Asymptomatic.   Past Medical History:  Diagnosis Date  . A-fib ([place])   . Anemia   . Arthritis   . Diabetes mellitus    "diet controlled"  . Diabetes mellitus without complication ([place])   . ESRD (end stage renal disease) ([place])    dialysis MWF  . History of blood transfusion   . HLD (hyperlipidemia)   . Hypertension    sees Dr. Dione Housekeeper  . Hypothyroidism   . PAF (paroxysmal atrial fibrillation) ([place])    a. Echo 11/16:  Mild LVH, EF 55-60%, normal wall motion, MAC, mild MR, severe LAE (49 ml/m2), mild RVE, normal RVSF, mild RAE, mild TR, PASP 24 mmHg;  CHADS2-VASc: 4 >> Coumadin followed by PCP  . Renal disorder     Past Surgical History:  Procedure Laterality Date  . AV FISTULA PLACEMENT  04/03/2012   Procedure: ARTERIOVENOUS (AV) FISTULA CREATION;  Surgeon: Elam Dutch, MD;  Location: [facility];  Service: Vascular;  Laterality: Left;  creation left brachial cephalic fistula   . CARPAL TUNNEL RELEASE    . DILATION AND CURETTAGE OF UTERUS    . EYE SURGERY Bilateral    bilateral cataract removal  . JOINT REPLACEMENT Bilateral    bilateral knee  . JOINT REPLACEMENT Right    shoulder  .  LIGATION OF ARTERIOVENOUS  FISTULA Left 02/04/2015   Procedure: LIGATION OF BRACHIOCEPHALIC ARTERIOVENOUS  FISTULA;  Surgeon: Conrad Pender, MD;  Location: [facility];  Service: Vascular;  Laterality: Left;  . PORTACATH PLACEMENT    . SPLIT NIGHT STUDY  07/26/2015  . THROMBECTOMY BRACHIAL ARTERY Left 02/06/2015   Procedure: EVACUATION OF LEFT ARM HEMATOMA;  Surgeon: Angelia Mould, MD;  Location: [facility];  Service: Vascular;  Laterality: Left;  . TOTAL KNEE ARTHROPLASTY     right knee  . TUBAL LIGATION      Current Outpatient Prescriptions  Medication Sig Dispense Refill  . ACCU-CHEK AVIVA PLUS test strip     . acetaminophen (TYLENOL) 325 MG tablet Take 650 mg by mouth every 6 (six) hours as needed  for mild pain.    Marland Kitchen albuterol (PROVENTIL HFA;VENTOLIN HFA) 108 (90 Base) MCG/ACT inhaler Inhale 2 puffs into the lungs every 6 (six) hours as needed for wheezing or shortness of breath.    . budesonide-formoterol (SYMBICORT) 80-4.5 MCG/ACT inhaler Inhale 2 puffs into the lungs 2 (two) times daily. 1 Inhaler 0  . budesonide-formoterol (SYMBICORT) 80-4.5 MCG/ACT inhaler Take 2 puffs first thing in am and then another 2 puffs about 12 hours later.   as needed    . cinacalcet (SENSIPAR) 30 MG tablet Take 30 mg by mouth every other day.    . colchicine 0.6 MG tablet TAKE ONE TABLET BY MOUTH EVERY DAY    . Darbepoetin Alfa (ARANESP) 60 MCG/0.3ML SOSY injection Inject 0.3 mLs (60 mcg total) into the vein every Thursday with hemodialysis.    Marland Kitchen doxercalciferol (HECTOROL) 0.5 MCG capsule Take 0.5 mcg by mouth 3 (three) times a week. Every Mon / Wed / Fri  w/dialysis    . ezetimibe (ZETIA) 10 MG tablet Take 10 mg by mouth daily.     . famotidine (PEPCID) 20 MG tablet One at bedtime 30 tablet 0  . gabapentin (NEURONTIN) 100 MG capsule Take 100 mg by mouth at bedtime.     Marland Kitchen levothyroxine (SYNTHROID, LEVOTHROID) 100 MCG tablet Take 100 mcg by mouth daily.      . midodrine (PROAMATINE) 10 MG tablet Take 10 mg by mouth 3 (three) times daily.     . multivitamin (RENA-VIT) TABS tablet Take 1 tablet by mouth daily.    . pantoprazole (PROTONIX) 40 MG tablet Take 1 tablet (40 mg total) by mouth daily. Take 30-60 min before first meal of the day 30 tablet 11  . polyethylene glycol (MIRALAX / GLYCOLAX) packet Take 17 g by mouth daily. 14 each 0  . Vitamin D, Ergocalciferol, (DRISDOL) 50000 UNITS CAPS Take 50,000 Units by mouth every Monday.     . warfarin (COUMADIN) 5 MG tablet Take 7.5mg  daily on M-W-F abd then 5 mg daily the rest of the week (Patient taking differently: Take 5 mg by mouth daily at 6 PM. Take 7.5mg  every day but Wednesday, when she takes 5mg ) 45 tablet 1   No current facility-administered medications  for this visit.     Allergies:   Sulfa drugs cross reactors; Amoxicillin; Percocet [oxycodone-acetaminophen]; and Sulfa antibiotics    Social History:  The patient  reports that she quit smoking about 17 years ago. Her smoking use included Cigarettes. She has a 0.50 pack-year smoking history. She has never used smokeless tobacco. She reports that she does not drink alcohol or use drugs.   Family History:  The patient's family history includes Cancer in her brother and sister; Diabetes  in her father and sister; Heart disease in her father; Hyperlipidemia in her daughter; Hypertension in her daughter and son; Lung cancer in her sister.    ROS:  Please see the history of present illness. All other systems are reviewed and negative.    PHYSICAL EXAM: VS:  Pulse 86   Ht 5\' 5"  (1.651 m)   Wt 195 lb (88.5 kg)   SpO2 97%   BMI 32.45 kg/m  , BMI Body mass index is 32.45 kg/m. GEN: Well nourished, chronically ill appearing elderly, female in no acute distress  HEENT: normal for age  Neck: no JVD, no carotid bruit, no masses Cardiac: Irregular rate and rhythm; soft murmur, no rubs, or gallops Respiratory:  Decreased breath sounds bases bilaterally, normal work of breathing GI: soft, nontender, nondistended, + BS MS: no deformity or atrophy; no edema; distal pulses are present in all extremities, she has a failed dialysis graft in her left upper forearm, but the brachial pulses easily palpable.   Skin: warm and dry, no rash Neuro:  Strength and sensation are intact Psych: euthymic mood, full affect   EKG:  EKG is ordered today. The ekg ordered today demonstrates atrial fibrillation, rate 86   Recent Labs: 02/04/2015: TSH 0.929 08/27/2015: B Natriuretic Peptide 147.0 09/05/2015: BUN 26; Creatinine, Ser 4.79; Hemoglobin 11.3; Platelets 236; Potassium 3.3; Sodium 137    Lipid Panel    Component Value Date/Time   CHOL 155 02/06/2015 0308   TRIG 111 02/06/2015 0308   HDL 58 02/06/2015  0308   CHOLHDL 2.7 02/06/2015 0308   VLDL 22 02/06/2015 0308   LDLCALC 75 02/06/2015 0308     Wt Readings from Last 3 Encounters:  12/29/15 195 lb (88.5 kg)  12/17/15 195 lb 9.6 oz (88.7 kg)  11/19/15 189 lb (85.7 kg)     Other studies Reviewed: Additional studies/ records that were reviewed today include: Office notes, hospital records and personal records.  ASSESSMENT AND PLAN:  1.  Atrial fibrillation: She has slow heart rates in the 40s at times but they are not sustained and she is asymptomatic with this. When she relaxes at dialysis, her heart rate drops, but it is temporary and causes her no symptoms. She is not on any rate lowering medications. No medication changes are made at this time.   I do not believe she has true tachybradycardia syndrome, because the tachycardia is always associated with being lightheaded after dialysis, or other appropriate reason for her heart rate to be elevated. I discussed that a pacemaker might be needed at some point, but emphasized that it does not do anything to control tachycardia, it only controls bradycardia.  If the tachycardia needs control, consideration can be given to using an antiarrhythmic such as amiodarone. I will discuss this with Dr. Percival Spanish.  At this point, her blood pressure limits [place] adding any medications to control her heart rate. She is still taking the med a dream 3 times a day. Even on this, she is having problems with hypotension at times during dialysis. Continue ProAmatine, follow for symptoms.  2. Chronic anticoagulation: Her CHA2DS2VASc=5 (age x 2, female, DM, HTN). Coumadin is managed by her primary care physician.  3. End-stage renal disease on hemodialysis: She is compliant with her dialysis appointments and fluid restrictions in between. According to the patient and her family, they have decided to increase her dry weight by half a kilogram. This may help with some of her hypotension issues.   Current medicines  are  reviewed at length with the patient today.  The patient does not have concerns regarding medicines.  The following changes have been made:  no change  Labs/ tests ordered today include:  No orders of the defined types were placed in this encounter.    Disposition:   FU with Dr. Percival Spanish  Signed, Rosaria Ferries, PA-C  12/29/2015 5:12 PM    [place] Phone: [phone]; Fax: [phone]  This note was written with the assistance of speech recognition software. Please excuse any transcriptional errors.

## 2015-12-31 NOTE — H&P (Signed)
[name] is a 75 y.o. female.    Chief Complaint: left shoulder pain  HPI: Pt is a 75 y.o. female complaining of left shoulder pain for multiple years. Pain had continually increased since the beginning. X-rays in the clinic show end-stage arthritic changes of the left shoulder. Pt has tried various conservative treatments which have failed to alleviate their symptoms, including injections and therapy. Various options are discussed with the patient. Risks, benefits and expectations were discussed with the patient. Patient understand the risks, benefits and expectations and wishes to proceed with surgery.   PCP:  Sherrie Mustache, MD  D/C Plans: Home  PMH: Past Medical History:  Diagnosis Date  . A-fib ([place])   . Anemia   . Arthritis   . Diabetes mellitus    "diet controlled"  . Diabetes mellitus without complication ([place])   . ESRD (end stage renal disease) ([place])    dialysis MWF  . History of blood transfusion   . HLD (hyperlipidemia)   . Hypertension    sees Dr. Dione Housekeeper  . Hypothyroidism   . PAF (paroxysmal atrial fibrillation) ([place])    a. Echo 11/16:  Mild LVH, EF 55-60%, normal wall motion, MAC, mild MR, severe LAE (49 ml/m2), mild RVE, normal RVSF, mild RAE, mild TR, PASP 24 mmHg;  CHADS2-VASc: 4 >> Coumadin followed by PCP  . Renal disorder     PSH: Past Surgical History:  Procedure Laterality Date  . AV FISTULA PLACEMENT  04/03/2012   Procedure: ARTERIOVENOUS (AV) FISTULA CREATION;  Surgeon: Elam Dutch, MD;  Location: [facility];  Service: Vascular;  Laterality: Left;  creation left brachial cephalic fistula   . CARPAL TUNNEL RELEASE    . DILATION AND CURETTAGE OF UTERUS    . EYE SURGERY Bilateral    bilateral cataract removal  . JOINT REPLACEMENT Bilateral    bilateral knee  . JOINT REPLACEMENT Right    shoulder  . LIGATION OF ARTERIOVENOUS  FISTULA Left 02/04/2015   Procedure: LIGATION OF BRACHIOCEPHALIC ARTERIOVENOUS  FISTULA;  Surgeon: Conrad Milledgeville, MD;  Location: [facility];  Service: Vascular;  Laterality: Left;  . PORTACATH PLACEMENT    . SPLIT NIGHT STUDY  07/26/2015  . THROMBECTOMY BRACHIAL ARTERY Left 02/06/2015   Procedure: EVACUATION OF LEFT ARM HEMATOMA;  Surgeon: Angelia Mould, MD;  Location: [facility];  Service: Vascular;  Laterality: Left;  . TOTAL KNEE ARTHROPLASTY     right knee  . TUBAL LIGATION      Social History:  reports that she quit smoking about 17 years ago. Her smoking use included Cigarettes. She has a 0.50 pack-year smoking history. She has never used smokeless tobacco. She reports that she does not drink alcohol or use drugs.  Allergies:  Allergies  Allergen Reactions  . Sulfa Drugs Cross Reactors Hives and Itching  . Amoxicillin Rash  . Percocet [Oxycodone-Acetaminophen] Itching and Rash    Did not happen last time she took it  . Sulfa Antibiotics Hives    Medications: No current facility-administered medications for this encounter.    Current Outpatient Prescriptions  Medication Sig Dispense Refill  . ACCU-CHEK AVIVA PLUS test strip     . albuterol (PROVENTIL HFA;VENTOLIN HFA) 108 (90 Base) MCG/ACT inhaler Inhale 2 puffs into the lungs every 6 (six) hours as needed for wheezing or shortness of breath.    . budesonide-formoterol (SYMBICORT) 80-4.5 MCG/ACT inhaler Inhale 2 puffs into the lungs 2 (two) times daily. 1 Inhaler 0  . budesonide-formoterol (  SYMBICORT) 80-4.5 MCG/ACT inhaler Take 2 puffs first thing in am and then another 2 puffs about 12 hours later.   as needed    . cinacalcet (SENSIPAR) 30 MG tablet Take 30 mg by mouth every other day.    . colchicine 0.6 MG tablet TAKE ONE TABLET BY MOUTH EVERY DAY    . Darbepoetin Alfa (ARANESP) 60 MCG/0.3ML SOSY injection Inject 0.3 mLs (60 mcg total) into the vein every Thursday with hemodialysis.    Marland Kitchen doxercalciferol (HECTOROL) 0.5 MCG capsule Take 0.5 mcg by mouth 3 (three) times a week. Every Mon / Wed / Fri  w/dialysis    . ezetimibe (ZETIA)  10 MG tablet Take 10 mg by mouth daily.     . famotidine (PEPCID) 20 MG tablet One at bedtime 30 tablet 0  . gabapentin (NEURONTIN) 100 MG capsule Take 100 mg by mouth at bedtime.     Marland Kitchen levothyroxine (SYNTHROID, LEVOTHROID) 100 MCG tablet Take 100 mcg by mouth daily.      . midodrine (PROAMATINE) 10 MG tablet Take 10 mg by mouth 3 (three) times daily.     . multivitamin (RENA-VIT) TABS tablet Take 1 tablet by mouth daily.    . pantoprazole (PROTONIX) 40 MG tablet Take 1 tablet (40 mg total) by mouth daily. Take 30-60 min before first meal of the day 30 tablet 11  . polyethylene glycol (MIRALAX / GLYCOLAX) packet Take 17 g by mouth daily. 14 each 0  . Vitamin D, Ergocalciferol, (DRISDOL) 50000 UNITS CAPS Take 50,000 Units by mouth every Monday.     . warfarin (COUMADIN) 5 MG tablet Take 7.5mg  daily on M-W-F abd then 5 mg daily the rest of the week (Patient taking differently: Take 5 mg by mouth daily at 6 PM. Take 7.5mg  every day but Wednesday, when she takes 5mg ) 45 tablet 1    No results found for this or any previous visit (from the past 48 hour(s)). No results found.  ROS: Pain with rom of the left upper extremity  Physical Exam:  Alert and oriented 75 y.o. female in no acute distress Cranial nerves 2-12 intact Cervical spine: full rom with no tenderness, nv intact distally Chest: active breath sounds bilaterally, no wheeze rhonchi or rales Heart: regular rate and rhythm, no murmur Abd: non tender non distended with active bowel sounds Hip is stable with rom  Left shoulder with limited rom and strength due to arthropathy nv intact distally No rashes or signs of infection  Assessment/Plan Assessment: left shoulder cuff arthropathy  Plan: Patient will undergo a left reverse total shoulder by Dr. Veverly Fells at [facility]. Risks benefits and expectations were discussed with the patient. Patient understand risks, benefits and expectations and wishes to proceed.

## 2016-01-06 ENCOUNTER — Other Ambulatory Visit: Payer: Self-pay | Admitting: Internal Medicine

## 2016-01-06 DIAGNOSIS — R053 Chronic cough: Secondary | ICD-10-CM

## 2016-01-06 DIAGNOSIS — R05 Cough: Secondary | ICD-10-CM

## 2016-01-06 MED ORDER — FAMOTIDINE 20 MG PO TABS: ORAL_TABLET | ORAL | 0 refills | Status: DC

## 2016-01-14 ENCOUNTER — Encounter (HOSPITAL_COMMUNITY)
Admission: RE | Admit: 2016-01-14 | Discharge: 2016-01-14 | Disposition: A | Discharge status: Home / Self Care | Payer: Medicare Other | Source: Ambulatory Visit | Attending: Orthopedic Surgery | Admitting: Orthopedic Surgery

## 2016-01-14 ENCOUNTER — Encounter (HOSPITAL_COMMUNITY): Payer: Self-pay

## 2016-01-19 ENCOUNTER — Encounter: Payer: Self-pay | Admitting: Physician Assistant

## 2016-01-19 ENCOUNTER — Encounter (HOSPITAL_COMMUNITY): Payer: Self-pay

## 2016-01-19 ENCOUNTER — Encounter (HOSPITAL_COMMUNITY)
Admission: RE | Admit: 2016-01-19 | Discharge: 2016-01-19 | Disposition: A | Discharge status: Home / Self Care | Payer: Medicare Other | Source: Ambulatory Visit | Attending: Orthopedic Surgery | Admitting: Orthopedic Surgery

## 2016-01-19 ENCOUNTER — Ambulatory Visit (INDEPENDENT_AMBULATORY_CARE_PROVIDER_SITE_OTHER): Payer: Medicare Other | Admitting: Physician Assistant

## 2016-01-19 VITALS — BP 122/86 | HR 78 | Ht 65.0 in | Wt 196.4 lb

## 2016-01-19 DIAGNOSIS — E1122 Type 2 diabetes mellitus with diabetic chronic kidney disease: Secondary | ICD-10-CM | POA: Insufficient documentation

## 2016-01-19 DIAGNOSIS — I129 Hypertensive chronic kidney disease with stage 1 through stage 4 chronic kidney disease, or unspecified chronic kidney disease: Secondary | ICD-10-CM

## 2016-01-19 DIAGNOSIS — Z7901 Long term (current) use of anticoagulants: Secondary | ICD-10-CM

## 2016-01-19 DIAGNOSIS — N186 End stage renal disease: Secondary | ICD-10-CM

## 2016-01-19 DIAGNOSIS — Z885 Allergy status to narcotic agent status: Secondary | ICD-10-CM

## 2016-01-19 DIAGNOSIS — M12812 Other specific arthropathies, not elsewhere classified, left shoulder: Secondary | ICD-10-CM

## 2016-01-19 DIAGNOSIS — Z882 Allergy status to sulfonamides status: Secondary | ICD-10-CM | POA: Insufficient documentation

## 2016-01-19 DIAGNOSIS — E039 Hypothyroidism, unspecified: Secondary | ICD-10-CM

## 2016-01-19 DIAGNOSIS — E785 Hyperlipidemia, unspecified: Secondary | ICD-10-CM | POA: Insufficient documentation

## 2016-01-19 DIAGNOSIS — Z01818 Encounter for other preprocedural examination: Secondary | ICD-10-CM

## 2016-01-19 DIAGNOSIS — Z992 Dependence on renal dialysis: Secondary | ICD-10-CM | POA: Insufficient documentation

## 2016-01-19 DIAGNOSIS — I953 Hypotension of hemodialysis: Secondary | ICD-10-CM

## 2016-01-19 DIAGNOSIS — I480 Paroxysmal atrial fibrillation: Secondary | ICD-10-CM

## 2016-01-19 DIAGNOSIS — Z7951 Long term (current) use of inhaled steroids: Secondary | ICD-10-CM

## 2016-01-19 DIAGNOSIS — Z87891 Personal history of nicotine dependence: Secondary | ICD-10-CM

## 2016-01-19 DIAGNOSIS — Z01812 Encounter for preprocedural laboratory examination: Secondary | ICD-10-CM

## 2016-01-19 DIAGNOSIS — Z79899 Other long term (current) drug therapy: Secondary | ICD-10-CM | POA: Insufficient documentation

## 2016-01-19 DIAGNOSIS — I4891 Unspecified atrial fibrillation: Secondary | ICD-10-CM | POA: Diagnosis not present

## 2016-01-19 HISTORY — DX: Gastro-esophageal reflux disease without esophagitis: K21.9

## 2016-01-19 HISTORY — DX: Unspecified hearing loss, unspecified ear: H91.90

## 2016-01-19 HISTORY — DX: Gout, unspecified: M10.9

## 2016-01-19 LAB — BASIC METABOLIC PANEL
Anion gap: 11 (ref 5–15)
BUN: 30 mg/dL — ABNORMAL HIGH (ref 6–20)
CO2: 27 mmol/L (ref 22–32)
Calcium: 9.5 mg/dL (ref 8.9–10.3)
Chloride: 100 mmol/L — ABNORMAL LOW (ref 101–111)
Creatinine, Ser: 5.58 mg/dL — ABNORMAL HIGH (ref 0.44–1.00)
GFR calc Af Amer: 8 mL/min — ABNORMAL LOW (ref >60)
GFR calc non Af Amer: 7 mL/min — ABNORMAL LOW (ref >60)
Glucose, Bld: 84 mg/dL (ref 65–99)
Potassium: 4.2 mmol/L (ref 3.5–5.1)
Sodium: 138 mmol/L (ref 135–145)

## 2016-01-19 LAB — SURGICAL PCR SCREEN
MRSA, PCR: NEGATIVE
Staphylococcus aureus: POSITIVE — AB

## 2016-01-19 LAB — CBC
HCT: 38.9 % (ref 36.0–46.0)
Hemoglobin: 12.3 g/dL (ref 12.0–15.0)
MCH: 30.7 pg (ref 26.0–34.0)
MCHC: 31.6 g/dL (ref 30.0–36.0)
MCV: 97 fL (ref 78.0–100.0)
Platelets: 263 10*3/uL (ref 150–400)
RBC: 4.01 MIL/uL (ref 3.87–5.11)
RDW: 16.3 % — ABNORMAL HIGH (ref 11.5–15.5)
WBC: 6.6 10*3/uL (ref 4.0–10.5)

## 2016-01-19 NOTE — Patient Instructions (Signed)
Medication Instructions:  Your physician recommends that you continue on your current medications as directed. Please refer to the Current Medication list given to you today.  Labwork: None   Testing/Procedures: None   Follow-Up: Your physician wants you to follow-up in: May 2018.  You will receive a reminder letter in the mail two months in advance. If you don't receive a letter, please call our office to schedule the follow-up appointment.  Any Other Special Instructions Will Be Listed Below (If Applicable).     If you need a refill on your cardiac medications before your next appointment, please call your pharmacy.

## 2016-01-19 NOTE — Progress Notes (Signed)
Cardiology Office Note   Date:  01/19/2016   ID:  [name], DOB 09/09/1940, MRN [id number]  PCP:  Sherrie Mustache, MD  Cardiologist:  Dr Mardene Celeste, PA-C 12/29/2015  Chief Complaint  Patient presents with  . Medical Clearance    shoulder    History of Present Illness: [name] is a 75 y.o. female with a history of ESRD on HD, cough variant asthma (?GERD component), DM, afib on coumadin, HTN, HLD, hypothyroid, nl EF by echo 02/2015,  CHA2DS2VASc=5 (age x 2, female, DM, HTN).   Seen 09/26 for bradycardia seen at HD, HR as low as 44, no clear sx from this, on no rate-lowering rx, on Proamatine Total L Shoulder surgery scheduled for 10/20,Dr. Percival Spanish has cleared her  [name] presents for further evaluation and management cardiac issues. Her daughter is with her.  Ms. Wickwire is doing pretty well. Her left shoulder gives her significant pain and discomfort. She cannot raise her arm even with her shoulder. She is looking forward to feeling better. She had the other shoulder done several years ago and came through it fine.  She does not get chest pain. Her daughter has records from the dialysis center with her today. Her blood pressure still dropping sig if it commonly, mostly during dialysis. It has helped that they have increased her dry weight. Frequently, they have to turn off the ultrafiltration because her blood pressure is low. Her heart rate has been in the 40s at times but this is not associated with her lowest blood pressures. Her lowest blood pressures are in the 70s, but this is mostly during the end of dialysis. Her heart rate is generally in the 50s were better, with some readings in the 40s. She is compliant with her medical history. In general, she is feeling well.   Since they increased her dry weight, she has not had problems with tachycardia. I see no heart rates recorded above 100. She has not been lightheaded or dizzy.   Past Medical  History:  Diagnosis Date  . A-fib ([place])   . Anemia   . Arthritis   . Diabetes mellitus    "diet controlled"  . Diabetes mellitus without complication ([place])   . ESRD (end stage renal disease) ([place])    dialysis MWF  . GERD (gastroesophageal reflux disease)   . History of blood transfusion   . HLD (hyperlipidemia)   . Hypertension    hypotensive -since starting dialysis  . Hypothyroidism   . PAF (paroxysmal atrial fibrillation) ([place])    a. Echo 11/16:  Mild LVH, EF 55-60%, normal wall motion, MAC, mild MR, severe LAE (49 ml/m2), mild RVE, normal RVSF, mild RAE, mild TR, PASP 24 mmHg;  CHADS2-VASc: 4 >> Coumadin followed by PCP  . Renal disorder     Past Surgical History:  Procedure Laterality Date  . AV FISTULA PLACEMENT  04/03/2012   Procedure: ARTERIOVENOUS (AV) FISTULA CREATION;  Surgeon: Elam Dutch, MD;  Location: [facility];  Service: Vascular;  Laterality: Left;  creation left brachial cephalic fistula   . CARPAL TUNNEL RELEASE Left   . COLONOSCOPY W/ POLYPECTOMY    . DILATION AND CURETTAGE OF UTERUS    . EYE SURGERY Bilateral    bilateral cataract removal  . Hemodialysis  catheter Right   . JOINT REPLACEMENT Bilateral    bilateral knee  . JOINT REPLACEMENT Right    shoulder  . LIGATION OF ARTERIOVENOUS  FISTULA Left  02/04/2015   Procedure: LIGATION OF BRACHIOCEPHALIC ARTERIOVENOUS  FISTULA;  Surgeon: Conrad Hancock, MD;  Location: [facility];  Service: Vascular;  Laterality: Left;  . PORTACATH PLACEMENT    . SPLIT NIGHT STUDY  07/26/2015  . THROMBECTOMY BRACHIAL ARTERY Left 02/06/2015   Procedure: EVACUATION OF LEFT ARM HEMATOMA;  Surgeon: Angelia Mould, MD;  Location: [facility];  Service: Vascular;  Laterality: Left;  . TOTAL KNEE ARTHROPLASTY     right knee  . TUBAL LIGATION      Current Outpatient Prescriptions  Medication Sig Dispense Refill  . ACCU-CHEK AVIVA PLUS test strip     . albuterol (PROVENTIL HFA;VENTOLIN HFA) 108 (90 Base) MCG/ACT inhaler Inhale 2 puffs  into the lungs every 6 (six) hours as needed for wheezing or shortness of breath.    . budesonide-formoterol (SYMBICORT) 80-4.5 MCG/ACT inhaler Take 2 puffs first thing in am and then another 2 puffs about 12 hours later.   as needed    . cinacalcet (SENSIPAR) 30 MG tablet Take 30 mg by mouth every other day.    . colchicine 0.6 MG tablet TAKE ONE TABLET BY MOUTH EVERY DAY    . Darbepoetin Alfa (ARANESP) 60 MCG/0.3ML SOSY injection Inject 0.3 mLs (60 mcg total) into the vein every Thursday with hemodialysis. (Patient taking differently: Inject 60 mcg into the vein every Wednesday with hemodialysis. )    . doxercalciferol (HECTOROL) 0.5 MCG capsule Take 0.5 mcg by mouth every Monday, Wednesday, and Friday with hemodialysis. Every Mon / Wed / Fri  w/dialysis    . ezetimibe (ZETIA) 10 MG tablet Take 10 mg by mouth daily.     . famotidine (PEPCID) 20 MG tablet One at bedtime (Patient taking differently: Take 20 mg by mouth at bedtime. One at bedtime) 30 tablet 0  . gabapentin (NEURONTIN) 100 MG capsule Take 100 mg by mouth at bedtime.     Marland Kitchen levothyroxine (SYNTHROID, LEVOTHROID) 100 MCG tablet Take 100 mcg by mouth daily.      . midodrine (PROAMATINE) 10 MG tablet Take 10 mg by mouth 3 (three) times daily.     . multivitamin (RENA-VIT) TABS tablet Take 1 tablet by mouth daily.    . pantoprazole (PROTONIX) 40 MG tablet Take 1 tablet (40 mg total) by mouth daily. Take 30-60 min before first meal of the day 30 tablet 11  . polyethylene glycol (MIRALAX / GLYCOLAX) packet Take 17 g by mouth daily. 14 each 0  . Vitamin D, Ergocalciferol, (DRISDOL) 50000 UNITS CAPS Take 50,000 Units by mouth every Monday.     . warfarin (COUMADIN) 5 MG tablet Take 7.5mg  daily on M-W-F abd then 5 mg daily the rest of the week (Patient taking differently: Take 5-7.5 mg by mouth as directed. Take 1 tablet (5 MG) on Wednesday, but take 1.5 tablets (7.5 MG) on all other days.) 45 tablet 1   No current facility-administered  medications for this visit.     Allergies:   Sulfa drugs cross reactors; Amoxicillin; Percocet [oxycodone-acetaminophen]; and Sulfa antibiotics    Social History:  The patient  reports that she quit smoking about 17 years ago. Her smoking use included Cigarettes. She has a 0.50 pack-year smoking history. She has never used smokeless tobacco. She reports that she does not drink alcohol or use drugs.   Family History:  The patient's family history includes Cancer in her brother and sister; Diabetes in her father and sister; Heart disease in her father; Hyperlipidemia in her daughter; Hypertension  in her daughter and son; Lung cancer in her sister.    ROS:  Please see the history of present illness. All other systems are reviewed and negative.    PHYSICAL EXAM: VS:  BP 122/86   Pulse 78   Ht 5\' 5"  (1.651 m)   Wt 196 lb 6.4 oz (89.1 kg)   BMI 32.68 kg/m  , BMI Body mass index is 32.68 kg/m. GEN: Well developed, Chronically ill appearing, female in no acute distress  HEENT: normal for age  Neck: no JVD, no carotid bruit, no masses Cardiac: Irregular rate and rhythm, soft murmur, no rubs, or gallops Respiratory:  clear to auscultation bilaterally, normal work of breathing GI: soft, nontender, nondistended, + BS MS: no deformity or atrophy; no edema; distal pulses are palpable in all 4 extremities; she has a failed dialysis graft in her left upper forearm, but the brachial pulse is easily palpable.  Skin: warm and dry, no rash Neuro:  Strength and sensation are intact Psych: euthymic mood, full affect   EKG:  EKG is not ordered today.  Recent Labs: 02/04/2015: TSH 0.929 08/27/2015: B Natriuretic Peptide 147.0 09/05/2015: BUN 26; Creatinine, Ser 4.79; Hemoglobin 11.3; Platelets 236; Potassium 3.3; Sodium 137    Lipid Panel    Component Value Date/Time   CHOL 155 02/06/2015 0308   TRIG 111 02/06/2015 0308   HDL 58 02/06/2015 0308   CHOLHDL 2.7 02/06/2015 0308   VLDL 22 02/06/2015  0308   LDLCALC 75 02/06/2015 0308     Wt Readings from Last 3 Encounters:  01/19/16 196 lb (88.9 kg)  01/19/16 196 lb 6.4 oz (89.1 kg)  12/29/15 195 lb (88.5 kg)     Other studies Reviewed: Additional studies/ records that were reviewed today include: Office notes and other records.  ASSESSMENT AND PLAN:  1.  Atrial fibrillation: She is bradycardic at times, but is asymptomatic with this. Her daughter keeps an eye on it during dialysis and her heart rate has not dropped below 40. I advised that as long as she is asymptomatic and her heart rate is greater than 40 no further evaluation or workup needs to be done. Because of her need for dialysis access, I wish to avoid a pacemaker. She is on no rate lowering medications. Once her dry weight was increased, she had no further problems with tachycardia.  2. Hypotension: She continues to have blood pressure issues during dialysis, mostly towards the end. According to the daughter, she just has some bad days. The daughter thinks her bad days are more when there is extra fluid to pull off. She is counseled to be compliant with dietary and fluid restrictions. No change in her ProAmatine dose.  3. Preoperative evaluation: Dr. Percival Spanish has already declared her at acceptable risk for the surgery. I explained to the daughter that she is stable from a cardiac standpoint for the procedure, but they will have to watch her blood pressure and heart rate carefully. The daughter is to clarify whether she is to take the ProAmatine on the morning of surgery or not.   Current medicines are reviewed at length with the patient today.  The patient does not have concerns regarding medicines.  The following changes have been made:  no change  Labs/ tests ordered today include:  No orders of the defined types were placed in this encounter.    Disposition:   FU with Dr. Percival Spanish as scheduled  Signed, Lenoard Aden  01/19/2016 10:29 AM  [place] Phone: [phone]; Fax: [phone]  This note was written with the assistance of speech recognition software. Please excuse any transcriptional errors.

## 2016-01-19 NOTE — Pre-Procedure Instructions (Addendum)
    [name]  01/19/2016     Your procedure is scheduled on Friday, October 20.  Report to [facility] Admitting at 8:30 AM               Your surgery or procedure is scheduled for 10:30 AM   Call this number if you have problems the morning of surgery:[phone]                 For any other questions, please call [phone], Monday - Friday 8 AM - 4 PM.   Remember:  Do not eat food or drink liquids after midnight Thursday, October 19.  Take these medicines the morning of surgery with A SIP OF WATER: colchicine, levothyroxine (SYNTHROID),  LEVOTHROID), pantoprazole (PROTONIX), ezetimibe (ZETIA), Midodrine.                May use Albuterol inhaler and please bring it with you to the hospital.           Hold COumadin as Instructed by Der Veverly Fells                1 Week prior to surgery STOP taking Aspirin , Aspirin Products (Goody Powder, Excedrin Migraine), Ibuprofen (Advil), Naproxen (Aleve), Vitamins and Herbal Products (ie Fish Oil)   Do not wear jewelry, make-up or nail polish.  Do not wear lotions, powders, or perfumes, or deodorant.  Do not shave 48 hours prior to surgery.  Do not bring valuables to the hospital.  [facility] is not responsible for any belongings or valuables.  Contacts, dentures or bridgework may not be worn into surgery.  Leave your suitcase in the car.  After surgery it may be brought to your room.  For patients admitted to the hospital, discharge time will be determined by your treatment team.  Patients discharged the day of surgery will not be allowed to drive home.   Name and phone number of your driver:   Special instructions:  Review  [facility] - Preparing For Surgery.  Please read over the following fact sheets that you were given. [facility]- Preparing For Surgery and Patient Instructions for Mupirocin Application, Incentive Spirometry, Pian Management.

## 2016-01-22 ENCOUNTER — Inpatient Hospital Stay (HOSPITAL_COMMUNITY)
Admission: RE | Admit: 2016-01-22 | Discharge: 2016-01-24 | DRG: 483 | Disposition: A | Discharge status: Home / Self Care | Payer: Medicare Other | Source: Ambulatory Visit | Attending: Orthopedic Surgery | Admitting: Orthopedic Surgery

## 2016-01-22 ENCOUNTER — Encounter (HOSPITAL_COMMUNITY)
Admission: RE | Disposition: A | Discharge status: Home / Self Care | Payer: Self-pay | Source: Ambulatory Visit | Attending: Orthopedic Surgery

## 2016-01-22 ENCOUNTER — Inpatient Hospital Stay (HOSPITAL_COMMUNITY): Payer: Medicare Other | Admitting: Certified Registered Nurse Anesthetist

## 2016-01-22 ENCOUNTER — Encounter (HOSPITAL_COMMUNITY): Payer: Self-pay | Admitting: Certified Registered Nurse Anesthetist

## 2016-01-22 ENCOUNTER — Inpatient Hospital Stay (HOSPITAL_COMMUNITY): Payer: Medicare Other

## 2016-01-22 DIAGNOSIS — Z801 Family history of malignant neoplasm of trachea, bronchus and lung: Secondary | ICD-10-CM

## 2016-01-22 DIAGNOSIS — M65341 Trigger finger, right ring finger: Secondary | ICD-10-CM | POA: Diagnosis present

## 2016-01-22 DIAGNOSIS — E1122 Type 2 diabetes mellitus with diabetic chronic kidney disease: Secondary | ICD-10-CM | POA: Diagnosis present

## 2016-01-22 DIAGNOSIS — Z96611 Presence of right artificial shoulder joint: Secondary | ICD-10-CM | POA: Diagnosis present

## 2016-01-22 DIAGNOSIS — I480 Paroxysmal atrial fibrillation: Secondary | ICD-10-CM | POA: Diagnosis present

## 2016-01-22 DIAGNOSIS — Z79899 Other long term (current) drug therapy: Secondary | ICD-10-CM | POA: Diagnosis not present

## 2016-01-22 DIAGNOSIS — Z7951 Long term (current) use of inhaled steroids: Secondary | ICD-10-CM

## 2016-01-22 DIAGNOSIS — I9589 Other hypotension: Secondary | ICD-10-CM | POA: Diagnosis present

## 2016-01-22 DIAGNOSIS — K219 Gastro-esophageal reflux disease without esophagitis: Secondary | ICD-10-CM | POA: Diagnosis present

## 2016-01-22 DIAGNOSIS — M25712 Osteophyte, left shoulder: Secondary | ICD-10-CM | POA: Diagnosis present

## 2016-01-22 DIAGNOSIS — Z87891 Personal history of nicotine dependence: Secondary | ICD-10-CM

## 2016-01-22 DIAGNOSIS — Z9841 Cataract extraction status, right eye: Secondary | ICD-10-CM

## 2016-01-22 DIAGNOSIS — Z7901 Long term (current) use of anticoagulants: Secondary | ICD-10-CM | POA: Diagnosis not present

## 2016-01-22 DIAGNOSIS — D631 Anemia in chronic kidney disease: Secondary | ICD-10-CM | POA: Diagnosis present

## 2016-01-22 DIAGNOSIS — Z885 Allergy status to narcotic agent status: Secondary | ICD-10-CM

## 2016-01-22 DIAGNOSIS — Z96653 Presence of artificial knee joint, bilateral: Secondary | ICD-10-CM | POA: Diagnosis present

## 2016-01-22 DIAGNOSIS — N2581 Secondary hyperparathyroidism of renal origin: Secondary | ICD-10-CM | POA: Diagnosis present

## 2016-01-22 DIAGNOSIS — M109 Gout, unspecified: Secondary | ICD-10-CM | POA: Diagnosis present

## 2016-01-22 DIAGNOSIS — Z880 Allergy status to penicillin: Secondary | ICD-10-CM

## 2016-01-22 DIAGNOSIS — Z8249 Family history of ischemic heart disease and other diseases of the circulatory system: Secondary | ICD-10-CM

## 2016-01-22 DIAGNOSIS — H9192 Unspecified hearing loss, left ear: Secondary | ICD-10-CM | POA: Diagnosis present

## 2016-01-22 DIAGNOSIS — Z96612 Presence of left artificial shoulder joint: Secondary | ICD-10-CM

## 2016-01-22 DIAGNOSIS — N186 End stage renal disease: Secondary | ICD-10-CM | POA: Diagnosis present

## 2016-01-22 DIAGNOSIS — E039 Hypothyroidism, unspecified: Secondary | ICD-10-CM | POA: Diagnosis present

## 2016-01-22 DIAGNOSIS — J45909 Unspecified asthma, uncomplicated: Secondary | ICD-10-CM | POA: Diagnosis present

## 2016-01-22 DIAGNOSIS — Z833 Family history of diabetes mellitus: Secondary | ICD-10-CM

## 2016-01-22 DIAGNOSIS — M75102 Unspecified rotator cuff tear or rupture of left shoulder, not specified as traumatic: Secondary | ICD-10-CM | POA: Diagnosis present

## 2016-01-22 DIAGNOSIS — E785 Hyperlipidemia, unspecified: Secondary | ICD-10-CM | POA: Diagnosis present

## 2016-01-22 DIAGNOSIS — I120 Hypertensive chronic kidney disease with stage 5 chronic kidney disease or end stage renal disease: Secondary | ICD-10-CM | POA: Diagnosis present

## 2016-01-22 DIAGNOSIS — M19012 Primary osteoarthritis, left shoulder: Principal | ICD-10-CM | POA: Diagnosis present

## 2016-01-22 DIAGNOSIS — Z882 Allergy status to sulfonamides status: Secondary | ICD-10-CM

## 2016-01-22 DIAGNOSIS — Z8342 Family history of familial hypercholesterolemia: Secondary | ICD-10-CM

## 2016-01-22 DIAGNOSIS — M810 Age-related osteoporosis without current pathological fracture: Secondary | ICD-10-CM | POA: Diagnosis present

## 2016-01-22 DIAGNOSIS — E8889 Other specified metabolic disorders: Secondary | ICD-10-CM | POA: Diagnosis present

## 2016-01-22 DIAGNOSIS — Z992 Dependence on renal dialysis: Secondary | ICD-10-CM

## 2016-01-22 DIAGNOSIS — M25512 Pain in left shoulder: Secondary | ICD-10-CM | POA: Diagnosis present

## 2016-01-22 DIAGNOSIS — Z9842 Cataract extraction status, left eye: Secondary | ICD-10-CM

## 2016-01-22 HISTORY — PX: REVERSE SHOULDER ARTHROPLASTY: SHX5054

## 2016-01-22 HISTORY — PX: STERIOD INJECTION: SHX5046

## 2016-01-22 LAB — POCT I-STAT 4, (NA,K, GLUC, HGB,HCT)
Glucose, Bld: 83 mg/dL (ref 65–99)
HCT: 36 % (ref 36.0–46.0)
Hemoglobin: 12.2 g/dL (ref 12.0–15.0)
Potassium: 3.9 mmol/L (ref 3.5–5.1)
Sodium: 136 mmol/L (ref 135–145)

## 2016-01-22 LAB — APTT
aPTT: 165 seconds (ref 24–36)
aPTT: 36 seconds (ref 24–36)

## 2016-01-22 LAB — GLUCOSE, CAPILLARY
Glucose-Capillary: 80 mg/dL (ref 65–99)
Glucose-Capillary: 117 mg/dL — ABNORMAL HIGH (ref 65–99)
Glucose-Capillary: 140 mg/dL — ABNORMAL HIGH (ref 65–99)
Glucose-Capillary: 115 mg/dL — ABNORMAL HIGH (ref 65–99)

## 2016-01-22 LAB — HEMOGLOBIN A1C
Hgb A1c MFr Bld: 4.7 % — ABNORMAL LOW (ref 4.8–5.6)
Mean Plasma Glucose: 88 mg/dL

## 2016-01-22 LAB — PROTIME-INR
INR: 1.08
Prothrombin Time: 14 seconds (ref 11.4–15.2)

## 2016-01-22 SURGERY — ARTHROPLASTY, SHOULDER, TOTAL, REVERSE
Anesthesia: Regional | Site: Shoulder | Laterality: Right

## 2016-01-22 MED ORDER — PHENOL 1.4 % MT LIQD: 1.0000 | OROMUCOSAL | Status: DC | PRN

## 2016-01-22 MED ORDER — PANTOPRAZOLE SODIUM 40 MG PO TBEC
40.0000 mg | DELAYED_RELEASE_TABLET | Freq: Every day | ORAL | Status: DC
  Administered 2016-01-23 – 2016-01-24 (×2): 40 mg via ORAL
  Filled 2016-01-22 (×2): qty 1

## 2016-01-22 MED ORDER — SUCCINYLCHOLINE CHLORIDE 200 MG/10ML IV SOSY
PREFILLED_SYRINGE | INTRAVENOUS | Status: AC
  Filled 2016-01-22: qty 20

## 2016-01-22 MED ORDER — LIDOCAINE HCL (PF) 1 % IJ SOLN
INTRAMUSCULAR | Status: AC
  Filled 2016-01-22: qty 30

## 2016-01-22 MED ORDER — LIDOCAINE HCL 1 % IJ SOLN
INTRAMUSCULAR | Status: DC | PRN
  Administered 2016-01-22: 10 mL

## 2016-01-22 MED ORDER — FENTANYL CITRATE (PF) 100 MCG/2ML IJ SOLN
INTRAMUSCULAR | Status: DC | PRN
  Administered 2016-01-22: 50 ug via INTRAVENOUS

## 2016-01-22 MED ORDER — METOCLOPRAMIDE HCL 5 MG PO TABS: 5.0000 mg | ORAL_TABLET | Freq: Three times a day (TID) | ORAL | Status: DC | PRN

## 2016-01-22 MED ORDER — SUCCINYLCHOLINE CHLORIDE 20 MG/ML IJ SOLN
INTRAMUSCULAR | Status: DC | PRN
  Administered 2016-01-22: 100 mg via INTRAVENOUS

## 2016-01-22 MED ORDER — WARFARIN SODIUM 5 MG PO TABS
10.0000 mg | ORAL_TABLET | Freq: Once | ORAL | Status: AC
  Administered 2016-01-22: 10 mg via ORAL
  Filled 2016-01-22: qty 2

## 2016-01-22 MED ORDER — ONDANSETRON HCL 4 MG/2ML IJ SOLN
INTRAMUSCULAR | Status: AC
  Filled 2016-01-22: qty 2

## 2016-01-22 MED ORDER — FENTANYL CITRATE (PF) 100 MCG/2ML IJ SOLN
INTRAMUSCULAR | Status: AC
  Filled 2016-01-22: qty 2

## 2016-01-22 MED ORDER — PHENYLEPHRINE HCL 10 MG/ML IJ SOLN
INTRAMUSCULAR | Status: DC | PRN
  Administered 2016-01-22 (×3): 160 ug via INTRAVENOUS
  Administered 2016-01-22 (×2): 120 ug via INTRAVENOUS

## 2016-01-22 MED ORDER — WARFARIN SODIUM 5 MG PO TABS: 5.0000 mg | ORAL_TABLET | ORAL | Status: DC

## 2016-01-22 MED ORDER — LIDOCAINE HCL (CARDIAC) 20 MG/ML IV SOLN
INTRAVENOUS | Status: DC | PRN
  Administered 2016-01-22: 80 mg via INTRAVENOUS

## 2016-01-22 MED ORDER — GABAPENTIN 100 MG PO CAPS
100.0000 mg | ORAL_CAPSULE | Freq: Every day | ORAL | Status: DC
  Administered 2016-01-22 – 2016-01-23 (×2): 100 mg via ORAL
  Filled 2016-01-22 (×2): qty 1

## 2016-01-22 MED ORDER — PHENYLEPHRINE 40 MCG/ML (10ML) SYRINGE FOR IV PUSH (FOR BLOOD PRESSURE SUPPORT)
PREFILLED_SYRINGE | INTRAVENOUS | Status: AC
  Filled 2016-01-22: qty 20

## 2016-01-22 MED ORDER — INSULIN ASPART 100 UNIT/ML ~~LOC~~ SOLN
0.0000 [IU] | Freq: Three times a day (TID) | SUBCUTANEOUS | Status: DC
  Administered 2016-01-22: 2 [IU] via SUBCUTANEOUS

## 2016-01-22 MED ORDER — CLINDAMYCIN PHOSPHATE 600 MG/50ML IV SOLN
600.0000 mg | Freq: Four times a day (QID) | INTRAVENOUS | Status: AC
  Administered 2016-01-22 – 2016-01-23 (×3): 600 mg via INTRAVENOUS
  Filled 2016-01-22 (×5): qty 50

## 2016-01-22 MED ORDER — TRIAMCINOLONE ACETONIDE 40 MG/ML IJ SUSP
INTRAMUSCULAR | Status: AC
  Filled 2016-01-22: qty 5

## 2016-01-22 MED ORDER — PROPOFOL 10 MG/ML IV BOLUS
INTRAVENOUS | Status: DC | PRN
  Administered 2016-01-22: 100 mg via INTRAVENOUS

## 2016-01-22 MED ORDER — CLINDAMYCIN PHOSPHATE 900 MG/50ML IV SOLN
900.0000 mg | INTRAVENOUS | Status: AC
  Administered 2016-01-22: 900 mg via INTRAVENOUS
  Filled 2016-01-22: qty 50

## 2016-01-22 MED ORDER — CINACALCET HCL 30 MG PO TABS
30.0000 mg | ORAL_TABLET | ORAL | Status: DC
  Administered 2016-01-23: 30 mg via ORAL
  Filled 2016-01-22: qty 1

## 2016-01-22 MED ORDER — FERRIC CITRATE 1 GM 210 MG(FE) PO TABS
1.0000 | ORAL_TABLET | ORAL | Status: DC
  Administered 2016-01-23: 1 via ORAL

## 2016-01-22 MED ORDER — LIDOCAINE 2% (20 MG/ML) 5 ML SYRINGE
INTRAMUSCULAR | Status: AC
  Filled 2016-01-22: qty 5

## 2016-01-22 MED ORDER — HYDROCODONE-ACETAMINOPHEN 5-325 MG PO TABS: 1.0000 | ORAL_TABLET | Freq: Four times a day (QID) | ORAL | 0 refills | Status: DC | PRN

## 2016-01-22 MED ORDER — HEPARIN SODIUM (PORCINE) 1000 UNIT/ML IJ SOLN
1000.0000 [IU] | Freq: Once | INTRAMUSCULAR | Status: AC
  Administered 2016-01-22: 1600 [IU] via INTRAVENOUS
  Filled 2016-01-22: qty 1

## 2016-01-22 MED ORDER — FERRIC CITRATE 1 GM 210 MG(FE) PO TABS: 2.0000 | ORAL_TABLET | Freq: Three times a day (TID) | ORAL | Status: DC

## 2016-01-22 MED ORDER — PHENYLEPHRINE 40 MCG/ML (10ML) SYRINGE FOR IV PUSH (FOR BLOOD PRESSURE SUPPORT)
PREFILLED_SYRINGE | INTRAVENOUS | Status: AC
  Filled 2016-01-22: qty 10

## 2016-01-22 MED ORDER — VITAMIN D (ERGOCALCIFEROL) 1.25 MG (50000 UNIT) PO CAPS: 50000.0000 [IU] | ORAL_CAPSULE | ORAL | Status: DC

## 2016-01-22 MED ORDER — ONDANSETRON HCL 4 MG PO TABS: 4.0000 mg | ORAL_TABLET | Freq: Four times a day (QID) | ORAL | Status: DC | PRN

## 2016-01-22 MED ORDER — INSULIN ASPART 100 UNIT/ML ~~LOC~~ SOLN: 0.0000 [IU] | Freq: Every day | SUBCUTANEOUS | Status: DC

## 2016-01-22 MED ORDER — PHENYLEPHRINE HCL 10 MG/ML IJ SOLN
INTRAVENOUS | Status: DC | PRN
  Administered 2016-01-22: 50 ug/min via INTRAVENOUS

## 2016-01-22 MED ORDER — ACETAMINOPHEN 650 MG RE SUPP: 650.0000 mg | Freq: Four times a day (QID) | RECTAL | Status: DC | PRN

## 2016-01-22 MED ORDER — RENA-VITE PO TABS
1.0000 | ORAL_TABLET | Freq: Every day | ORAL | Status: DC
  Administered 2016-01-23 – 2016-01-24 (×2): 1 via ORAL
  Filled 2016-01-22 (×2): qty 1

## 2016-01-22 MED ORDER — MOMETASONE FURO-FORMOTEROL FUM 100-5 MCG/ACT IN AERO
2.0000 | INHALATION_SPRAY | Freq: Two times a day (BID) | RESPIRATORY_TRACT | Status: DC
  Administered 2016-01-22 – 2016-01-24 (×3): 2 via RESPIRATORY_TRACT
  Filled 2016-01-22: qty 8.8

## 2016-01-22 MED ORDER — METHOCARBAMOL 1000 MG/10ML IJ SOLN
500.0000 mg | Freq: Four times a day (QID) | INTRAVENOUS | Status: DC | PRN
  Filled 2016-01-22: qty 5

## 2016-01-22 MED ORDER — MENTHOL 3 MG MT LOZG: 1.0000 | LOZENGE | OROMUCOSAL | Status: DC | PRN

## 2016-01-22 MED ORDER — SODIUM CHLORIDE 0.9 % IV SOLN
INTRAVENOUS | Status: DC
  Administered 2016-01-22: 14:00:00 via INTRAVENOUS

## 2016-01-22 MED ORDER — ONDANSETRON HCL 4 MG/2ML IJ SOLN: 4.0000 mg | Freq: Four times a day (QID) | INTRAMUSCULAR | Status: DC | PRN

## 2016-01-22 MED ORDER — MIDODRINE HCL 5 MG PO TABS
10.0000 mg | ORAL_TABLET | Freq: Three times a day (TID) | ORAL | Status: DC
  Administered 2016-01-22 – 2016-01-24 (×6): 10 mg via ORAL
  Filled 2016-01-22 (×5): qty 2

## 2016-01-22 MED ORDER — METHOCARBAMOL 500 MG PO TABS: 500.0000 mg | ORAL_TABLET | Freq: Four times a day (QID) | ORAL | Status: DC | PRN

## 2016-01-22 MED ORDER — ACETAMINOPHEN 325 MG PO TABS
650.0000 mg | ORAL_TABLET | Freq: Four times a day (QID) | ORAL | Status: DC | PRN
  Administered 2016-01-23: 650 mg via ORAL

## 2016-01-22 MED ORDER — ALBUMIN HUMAN 5 % IV SOLN
INTRAVENOUS | Status: DC | PRN
  Administered 2016-01-22 (×2): via INTRAVENOUS

## 2016-01-22 MED ORDER — FENTANYL CITRATE (PF) 100 MCG/2ML IJ SOLN
INTRAMUSCULAR | Status: AC
  Administered 2016-01-22: 50 ug via INTRAVENOUS
  Filled 2016-01-22: qty 2

## 2016-01-22 MED ORDER — SODIUM CHLORIDE 0.9 % IV SOLN
INTRAVENOUS | Status: DC | PRN
  Administered 2016-01-22 (×2): via INTRAVENOUS

## 2016-01-22 MED ORDER — BUPIVACAINE-EPINEPHRINE 0.25% -1:200000 IJ SOLN
INTRAMUSCULAR | Status: DC | PRN
  Administered 2016-01-22: 8 mL

## 2016-01-22 MED ORDER — DOXERCALCIFEROL 0.5 MCG PO CAPS: 0.5000 ug | ORAL_CAPSULE | ORAL | Status: DC

## 2016-01-22 MED ORDER — ALBUTEROL SULFATE (2.5 MG/3ML) 0.083% IN NEBU: 2.5000 mg | INHALATION_SOLUTION | Freq: Four times a day (QID) | RESPIRATORY_TRACT | Status: DC | PRN

## 2016-01-22 MED ORDER — EPHEDRINE 5 MG/ML INJ
INTRAVENOUS | Status: AC
  Filled 2016-01-22: qty 10

## 2016-01-22 MED ORDER — MORPHINE SULFATE (PF) 2 MG/ML IV SOLN: 2.0000 mg | INTRAVENOUS | Status: DC | PRN

## 2016-01-22 MED ORDER — COLCHICINE 0.6 MG PO TABS
0.6000 mg | ORAL_TABLET | Freq: Every day | ORAL | Status: DC
  Administered 2016-01-23 – 2016-01-24 (×2): 0.6 mg via ORAL
  Filled 2016-01-22 (×2): qty 1

## 2016-01-22 MED ORDER — ROPIVACAINE HCL 7.5 MG/ML IJ SOLN
INTRAMUSCULAR | Status: DC | PRN
  Administered 2016-01-22: 20 mL via PERINEURAL

## 2016-01-22 MED ORDER — METOCLOPRAMIDE HCL 5 MG/ML IJ SOLN: 5.0000 mg | Freq: Three times a day (TID) | INTRAMUSCULAR | Status: DC | PRN

## 2016-01-22 MED ORDER — FAMOTIDINE 20 MG PO TABS
20.0000 mg | ORAL_TABLET | Freq: Every day | ORAL | Status: DC
  Administered 2016-01-22 – 2016-01-23 (×2): 20 mg via ORAL
  Filled 2016-01-22 (×2): qty 1

## 2016-01-22 MED ORDER — DARBEPOETIN ALFA 60 MCG/0.3ML IJ SOSY: 60.0000 ug | PREFILLED_SYRINGE | INTRAMUSCULAR | Status: DC

## 2016-01-22 MED ORDER — LEVOTHYROXINE SODIUM 100 MCG PO TABS
100.0000 ug | ORAL_TABLET | Freq: Every day | ORAL | Status: DC
  Administered 2016-01-23 – 2016-01-24 (×2): 100 ug via ORAL
  Filled 2016-01-22 (×2): qty 1

## 2016-01-22 MED ORDER — PROPOFOL 10 MG/ML IV BOLUS
INTRAVENOUS | Status: AC
  Filled 2016-01-22: qty 20

## 2016-01-22 MED ORDER — ONDANSETRON HCL 4 MG/2ML IJ SOLN
INTRAMUSCULAR | Status: DC | PRN
  Administered 2016-01-22: 4 mg via INTRAVENOUS

## 2016-01-22 MED ORDER — CHLORHEXIDINE GLUCONATE 4 % EX LIQD: 60.0000 mL | Freq: Once | CUTANEOUS | Status: DC

## 2016-01-22 MED ORDER — DOXERCALCIFEROL 4 MCG/2ML IV SOLN
3.0000 ug | INTRAVENOUS | Status: DC
  Administered 2016-01-23: 3 ug via INTRAVENOUS
  Filled 2016-01-22: qty 2

## 2016-01-22 MED ORDER — INSULIN ASPART 100 UNIT/ML ~~LOC~~ SOLN
4.0000 [IU] | Freq: Three times a day (TID) | SUBCUTANEOUS | Status: DC
  Administered 2016-01-22 – 2016-01-24 (×4): 4 [IU] via SUBCUTANEOUS

## 2016-01-22 MED ORDER — EZETIMIBE 10 MG PO TABS
10.0000 mg | ORAL_TABLET | Freq: Every day | ORAL | Status: DC
Start: 2016-01-23 — End: 2016-01-24
  Administered 2016-01-23 – 2016-01-24 (×2): 10 mg via ORAL
  Filled 2016-01-22 (×2): qty 1

## 2016-01-22 MED ORDER — POLYETHYLENE GLYCOL 3350 17 G PO PACK
17.0000 g | PACK | Freq: Every day | ORAL | Status: DC
  Administered 2016-01-24: 17 g via ORAL
  Filled 2016-01-22 (×2): qty 1

## 2016-01-22 MED ORDER — POLYETHYLENE GLYCOL 3350 17 G PO PACK: 17.0000 g | PACK | Freq: Every day | ORAL | Status: DC | PRN

## 2016-01-22 MED ORDER — ONDANSETRON HCL 4 MG/2ML IJ SOLN
INTRAMUSCULAR | Status: AC
  Filled 2016-01-22: qty 4

## 2016-01-22 MED ORDER — WARFARIN - PHARMACIST DOSING INPATIENT
Freq: Every day | Status: DC
  Administered 2016-01-23: 17:00:00

## 2016-01-22 MED ORDER — DOCUSATE SODIUM 100 MG PO CAPS
100.0000 mg | ORAL_CAPSULE | Freq: Two times a day (BID) | ORAL | Status: DC
  Administered 2016-01-22 – 2016-01-24 (×5): 100 mg via ORAL
  Filled 2016-01-22 (×5): qty 1

## 2016-01-22 MED ORDER — HYDROCODONE-ACETAMINOPHEN 5-325 MG PO TABS
1.0000 | ORAL_TABLET | ORAL | Status: DC | PRN
  Administered 2016-01-23 (×2): 2 via ORAL
  Administered 2016-01-23: 1 via ORAL
  Administered 2016-01-24 (×2): 2 via ORAL
  Filled 2016-01-22: qty 1
  Filled 2016-01-22 (×3): qty 2

## 2016-01-22 MED ORDER — TRIAMCINOLONE ACETONIDE 40 MG/ML IJ SUSP
INTRAMUSCULAR | Status: DC | PRN
  Administered 2016-01-22: 40 mg via INTRAMUSCULAR

## 2016-01-22 MED ORDER — 0.9 % SODIUM CHLORIDE (POUR BTL) OPTIME
TOPICAL | Status: DC | PRN
  Administered 2016-01-22: 1000 mL

## 2016-01-22 SURGICAL SUPPLY — 74 items
BANDAGE ADH SHEER 1  50/CT (GAUZE/BANDAGES/DRESSINGS) ×2 IMPLANT
BIT DRILL 170X2.5X (BIT) IMPLANT
BIT DRILL 5/64X5 DISP (BIT) ×4 IMPLANT
BIT DRL 170X2.5X (BIT) ×2
BLADE SAG 18X100X1.27 (BLADE) ×4 IMPLANT
CAPT SHLDR REVTOTAL 1 ×2 IMPLANT
CEMENT BONE DEPUY (Cement) ×2 IMPLANT
CLOSURE STERI-STRIP 1/2X4 (GAUZE/BANDAGES/DRESSINGS) ×1
CLOSURE WOUND 1/2 X4 (GAUZE/BANDAGES/DRESSINGS) ×1
CLSR STERI-STRIP ANTIMIC 1/2X4 (GAUZE/BANDAGES/DRESSINGS) ×1 IMPLANT
COVER SURGICAL LIGHT HANDLE (MISCELLANEOUS) ×4 IMPLANT
DRAPE IMP U-DRAPE 54X76 (DRAPES) ×8 IMPLANT
DRAPE INCISE IOBAN 66X45 STRL (DRAPES) ×4 IMPLANT
DRAPE ORTHO SPLIT 77X108 STRL (DRAPES) ×8
DRAPE SURG ORHT 6 SPLT 77X108 (DRAPES) ×4 IMPLANT
DRAPE U-SHAPE 47X51 STRL (DRAPES) ×4 IMPLANT
DRAPE X-RAY CASS 24X20 (DRAPES) IMPLANT
DRILL 2.5 (BIT) ×4
DRSG ADAPTIC 3X8 NADH LF (GAUZE/BANDAGES/DRESSINGS) ×4 IMPLANT
DRSG PAD ABDOMINAL 8X10 ST (GAUZE/BANDAGES/DRESSINGS) ×4 IMPLANT
DURAPREP 26ML APPLICATOR (WOUND CARE) ×4 IMPLANT
ELECT BLADE 4.0 EZ CLEAN MEGAD (MISCELLANEOUS) ×4
ELECT NDL TIP 2.8 STRL (NEEDLE) ×2 IMPLANT
ELECT NEEDLE TIP 2.8 STRL (NEEDLE) ×4 IMPLANT
ELECT REM PT RETURN 9FT ADLT (ELECTROSURGICAL) ×4
ELECTRODE BLDE 4.0 EZ CLN MEGD (MISCELLANEOUS) ×2 IMPLANT
ELECTRODE REM PT RTRN 9FT ADLT (ELECTROSURGICAL) ×2 IMPLANT
GAUZE SPONGE 4X4 12PLY STRL (GAUZE/BANDAGES/DRESSINGS) ×4 IMPLANT
GAUZE SPONGE 4X4 16PLY XRAY LF (GAUZE/BANDAGES/DRESSINGS) ×2 IMPLANT
GLOVE BIOGEL PI ORTHO PRO 7.5 (GLOVE) ×2
GLOVE BIOGEL PI ORTHO PRO SZ8 (GLOVE) ×2
GLOVE ORTHO TXT STRL SZ7.5 (GLOVE) ×4 IMPLANT
GLOVE PI ORTHO PRO STRL 7.5 (GLOVE) ×2 IMPLANT
GLOVE PI ORTHO PRO STRL SZ8 (GLOVE) ×2 IMPLANT
GLOVE SURG ORTHO 8.5 STRL (GLOVE) ×4 IMPLANT
GOWN STRL REUS W/ TWL LRG LVL3 (GOWN DISPOSABLE) ×2 IMPLANT
GOWN STRL REUS W/ TWL XL LVL3 (GOWN DISPOSABLE) ×4 IMPLANT
GOWN STRL REUS W/TWL LRG LVL3 (GOWN DISPOSABLE) ×4
GOWN STRL REUS W/TWL XL LVL3 (GOWN DISPOSABLE) ×8
HANDPIECE INTERPULSE COAX TIP (DISPOSABLE) ×4
KIT BASIN OR (CUSTOM PROCEDURE TRAY) ×4 IMPLANT
KIT ROOM TURNOVER OR (KITS) ×4 IMPLANT
MANIFOLD NEPTUNE II (INSTRUMENTS) ×4 IMPLANT
NDL 1/2 CIR MAYO (NEEDLE) ×2 IMPLANT
NDL HYPO 25GX1X1/2 BEV (NEEDLE) ×2 IMPLANT
NEEDLE 1/2 CIR MAYO (NEEDLE) IMPLANT
NEEDLE HYPO 25GX1X1/2 BEV (NEEDLE) ×8 IMPLANT
NS IRRIG 1000ML POUR BTL (IV SOLUTION) ×4 IMPLANT
PACK SHOULDER (CUSTOM PROCEDURE TRAY) ×4 IMPLANT
PAD ARMBOARD 7.5X6 YLW CONV (MISCELLANEOUS) ×8 IMPLANT
PIN METAGLENE 2.5 (PIN) ×2 IMPLANT
SET HNDPC FAN SPRY TIP SCT (DISPOSABLE) IMPLANT
SLING ARM IMMOBILIZER LRG (SOFTGOODS) ×2 IMPLANT
SLING ARM LRG ADULT FOAM STRAP (SOFTGOODS) IMPLANT
SLING ARM MED ADULT FOAM STRAP (SOFTGOODS) IMPLANT
SPONGE LAP 18X18 X RAY DECT (DISPOSABLE) IMPLANT
SPONGE LAP 4X18 X RAY DECT (DISPOSABLE) ×4 IMPLANT
STRIP CLOSURE SKIN 1/2X4 (GAUZE/BANDAGES/DRESSINGS) ×3 IMPLANT
SUCTION FRAZIER HANDLE 10FR (MISCELLANEOUS) ×2
SUCTION TUBE FRAZIER 10FR DISP (MISCELLANEOUS) ×2 IMPLANT
SUT FIBERWIRE #2 38 T-5 BLUE (SUTURE) ×8
SUT MNCRL AB 4-0 PS2 18 (SUTURE) ×4 IMPLANT
SUT VIC AB 2-0 CT1 27 (SUTURE) ×4
SUT VIC AB 2-0 CT1 TAPERPNT 27 (SUTURE) ×2 IMPLANT
SUT VICRYL 0 CT 1 36IN (SUTURE) ×4 IMPLANT
SUTURE FIBERWR #2 38 T-5 BLUE (SUTURE) ×4 IMPLANT
SYR CONTROL 10ML LL (SYRINGE) ×6 IMPLANT
TAPE CLOTH SURG 6X10 WHT LF (GAUZE/BANDAGES/DRESSINGS) ×2 IMPLANT
TOWEL OR 17X24 6PK STRL BLUE (TOWEL DISPOSABLE) ×4 IMPLANT
TOWEL OR 17X26 10 PK STRL BLUE (TOWEL DISPOSABLE) ×4 IMPLANT
TOWER CARTRIDGE SMART MIX (DISPOSABLE) IMPLANT
TRAY FOLEY CATH 16FRSI W/METER (SET/KITS/TRAYS/PACK) IMPLANT
WATER STERILE IRR 1000ML POUR (IV SOLUTION) ×4 IMPLANT
YANKAUER SUCT BULB TIP NO VENT (SUCTIONS) ×4 IMPLANT

## 2016-01-22 NOTE — Discharge Instructions (Signed)
Ice to the shoulder as much as possible.  Keep the incision clean and dry and covered for one week, then ok to get it wet in the shower.  May remove the sling in the home and hug a pillow.  Keep elbow propped forward where you can see your elbow.  Ok to use the left arm for light activity of daily living.  Follow up with Dr Veverly Fells in two weeks in the office [phone]

## 2016-01-22 NOTE — Brief Op Note (Signed)
01/22/2016  12:15 PM  PATIENT:  [name]  75 y.o. female  PRE-OPERATIVE DIAGNOSIS: 1. left shoulder rotator cuff tear arthropathy 2. Right ring finger triggering  POST-OPERATIVE DIAGNOSIS: 1. left shoulder rotator cuff tear arthropathy  2. Right ring finger triggering  PROCEDURE:  Procedure(s): LEFT REVERSE SHOULDER ARTHROPLASTY (Left) RIGHT RING FINGER STEROID INJECTION (Right)  SURGEON:  Surgeon(s) and Role: Panel 1:    * Netta Cedars, MD - Primary  Panel 2:    * Netta Cedars, MD - Primary  PHYSICIAN ASSISTANT:   ASSISTANTS: Ventura Bruns, PA-C   ANESTHESIA:   regional and general  EBL:  Total I/O In: 1000 [I.V.:500; IV Piggyback:500] Out: 200 [Blood:200]  BLOOD ADMINISTERED:none  DRAINS: none   LOCAL MEDICATIONS USED:  MARCAINE     SPECIMEN:  No Specimen  DISPOSITION OF SPECIMEN:  N/A  COUNTS:  YES  TOURNIQUET:  * No tourniquets in log *  DICTATION: .Other Dictation: Dictation Number [id number]  PLAN OF CARE: Admit to inpatient   PATIENT DISPOSITION:  PACU - hemodynamically stable.   Delay start of Pharmacological VTE agent (>24hrs) due to surgical blood loss or risk of bleeding: not applicable

## 2016-01-22 NOTE — Anesthesia Preprocedure Evaluation (Addendum)
Anesthesia Evaluation  Patient identified by MRN, date of birth, ID band Patient awake    Reviewed: Allergy & Precautions, NPO status , Patient's Chart, lab work & pertinent test results  Airway Mallampati: II  TM Distance: >3 FB Neck ROM: Full    Dental  (+) Dental Advisory Given, Edentulous Upper, Edentulous Lower   Pulmonary asthma , former smoker,    Pulmonary exam normal breath sounds clear to auscultation       Cardiovascular Normal cardiovascular exam+ dysrhythmias Atrial Fibrillation  Rhythm:Regular Rate:Normal  Hypotension on midodrine   Neuro/Psych negative neurological ROS  negative psych ROS   GI/Hepatic Neg liver ROS, GERD  Medicated,  Endo/Other  diabetes, Well Controlled, Type 2Hypothyroidism Obesity   Renal/GU ESRF and DialysisRenal disease (MWF)     Musculoskeletal  (+) Arthritis , Osteoarthritis,    Abdominal   Peds  Hematology negative hematology ROS (+)   Anesthesia Other Findings Day of surgery medications reviewed with the patient.  Reproductive/Obstetrics negative OB ROS                            Anesthesia Physical Anesthesia Plan  ASA: III  Anesthesia Plan: General and Regional   Post-op Pain Management:  Regional for Post-op pain   Induction: Intravenous  Airway Management Planned: Oral ETT  Additional Equipment:   Intra-op Plan:   Post-operative Plan: Extubation in OR  Informed Consent: I have reviewed the patients History and Physical, chart, labs and discussed the procedure including the risks, benefits and alternatives for the proposed anesthesia with the patient or authorized representative who has indicated his/her understanding and acceptance.   Dental advisory given  Plan Discussed with: CRNA  Anesthesia Plan Comments: (Risks/benefits of general anesthesia discussed with patient including risk of damage to teeth, lips, gum, and tongue,  nausea/vomiting, allergic reactions to medications, and the possibility of heart attack, stroke and death.  All patient questions answered.  Patient wishes to proceed.)        Anesthesia Quick Evaluation

## 2016-01-22 NOTE — Interval H&P Note (Signed)
History and Physical Interval Note:  01/22/2016 10:05 AM  [name]  has presented today for surgery, with the diagnosis of left shoulder rotator cuff tear arthropathy  The various methods of treatment have been discussed with the patient and family. After consideration of risks, benefits and other options for treatment, the patient has consented to  Procedure(s): LEFT REVERSE SHOULDER ARTHROPLASTY (Left) as a surgical intervention .  The patient's history has been reviewed, patient examined, no change in status, stable for surgery.  I have reviewed the patient's chart and labs.  Questions were answered to the patient's satisfaction.     Myrtle Haller,STEVEN R

## 2016-01-22 NOTE — Consult Note (Signed)
[facility] Renal Consultation Note    Indication for Consultation:  Management of ESRD/hemodialysis, anemia, hypertension/volume, and secondary hyperparathyroidism. PCP:  HPI: [name] is a 75 y.o. female with ESRD, DM, GERD, atrial fibrillation (on warfarin), hypothyroidism, and arthritis who was admitted s/p L reverse shoulder arthroplasty by Dr. Veverly Fells.   Feeling well prior to surgery. No CP, dyspnea, fever, chills, N/V, abdominal pain, diarrhea. Currently without L shoulder pain. She dialyzes MWF at [facility], last HD 10/18. Reports that she often gets hypotensive and bradycardic with HD, EDW recently raised to assist with this.   Past Medical History:  Diagnosis Date  . A-fib ([place])   . Anemia   . Arthritis   . Diabetes mellitus    "diet controlled"  . Diabetes mellitus without complication ([place])   . ESRD (end stage renal disease) ([place])    dialysis MWF  . GERD (gastroesophageal reflux disease)   . Gout   . History of blood transfusion   . HLD (hyperlipidemia)   . HOH (hard of hearing)    left ear  . Hypertension    hypotensive -since starting dialysis  . Hypothyroidism   . PAF (paroxysmal atrial fibrillation) ([place])    a. Echo 11/16:  Mild LVH, EF 55-60%, normal wall motion, MAC, mild MR, severe LAE (49 ml/m2), mild RVE, normal RVSF, mild RAE, mild TR, PASP 24 mmHg;  CHADS2-VASc: 4 >> Coumadin followed by PCP  . Renal disorder    Past Surgical History:  Procedure Laterality Date  . AV FISTULA PLACEMENT  04/03/2012   Procedure: ARTERIOVENOUS (AV) FISTULA CREATION;  Surgeon: Elam Dutch, MD;  Location: [place];  Service: Vascular;  Laterality: Left;  creation left brachial cephalic fistula   . CARPAL TUNNEL RELEASE Left   . COLONOSCOPY W/ POLYPECTOMY    . DILATION AND CURETTAGE OF UTERUS    . EYE SURGERY Bilateral    bilateral cataract removal  . Hemodialysis  catheter Right   . JOINT REPLACEMENT Bilateral    bilateral knee  . JOINT  REPLACEMENT Right    shoulder  . LIGATION OF ARTERIOVENOUS  FISTULA Left 02/04/2015   Procedure: LIGATION OF BRACHIOCEPHALIC ARTERIOVENOUS  FISTULA;  Surgeon: Conrad , MD;  Location: [place];  Service: Vascular;  Laterality: Left;  . PORTACATH PLACEMENT    . SPLIT NIGHT STUDY  07/26/2015  . THROMBECTOMY BRACHIAL ARTERY Left 02/06/2015   Procedure: EVACUATION OF LEFT ARM HEMATOMA;  Surgeon: Angelia Mould, MD;  Location: [place];  Service: Vascular;  Laterality: Left;  . TOTAL KNEE ARTHROPLASTY     right knee  . TUBAL LIGATION     Family History  Problem Relation Age of Onset  . Diabetes Father     before age 71  . Heart disease Father   . Diabetes Sister   . Cancer Brother   . Hyperlipidemia Daughter   . Hypertension Daughter   . Hypertension Son   . Lung cancer Sister   . Cancer Sister    Social History:  reports that she quit smoking about 17 years ago. Her smoking use included Cigarettes. She has a 0.50 pack-year smoking history. She has never used smokeless tobacco. She reports that she does not drink alcohol or use drugs.  ROS: As per HPI otherwise negative.  Physical Exam: Vitals:   01/22/16 1215 01/22/16 1230 01/22/16 1239 01/22/16 1315  BP:    98/64  Pulse: 82 86 74 68  Resp: 10 13 19  18  Temp:    97.5 F (36.4 C)  SpO2: 94% 96% 96% 97%  Weight:      Height:         General: Well developed, well nourished, in no acute distress. Head: Normocephalic, atraumatic, sclera non-icteric, mucus membranes are moist. Neck: Supple without lymphadenopathy/masses. JVD not elevated. Lungs: Clear bilaterally to auscultation anteriorly. Unable to sit up or roll for posterior exam s/p surgery. Heart: RRR currently, no murmur or rub. Abdomen: Soft, non-tender, non-distended with normoactive bowel sounds. No rebound/guarding. No obvious abdominal masses. Musculoskeletal:  Strength and tone appear normal for age. L shoulder with bandage/ice pack in place. Lower extremities:  No LE edema or ischemic changes, no open wounds. Neuro: Alert and oriented X 3. Moves all extremities spontaneously. Psych:  Responds to questions appropriately with a normal affect. Dialysis Access: [facility] in R chest without erythema/tenderness. Attached to IV saline drip 42mL/hr.  Allergies  Allergen Reactions  . Amoxicillin Rash    Has patient had a PCN reaction causing immediate rash, facial/tongue/throat swelling, SOB or lightheadedness with hypotension:YES Has patient had a PCN reaction causing severe rash involving mucus membranes or skin necrosis: Yes Has patient had a PCN reaction that required hospitalization No Has patient had a PCN reaction occurring within the last 10 years: Yes If all of the above answers are "NO", then may proceed with Cephalosporin use.   . Sulfa Drugs Cross Reactors Hives and Itching  . Percocet [Oxycodone-Acetaminophen] Itching and Rash    Did not happen last time she took it  . Sulfa Antibiotics Hives   Prior to Admission medications   Medication Sig Start Date End Date Taking? Authorizing Provider  ACCU-CHEK AVIVA PLUS test strip  11/11/14  Yes Historical Provider, MD  albuterol (PROVENTIL HFA;VENTOLIN HFA) 108 (90 Base) MCG/ACT inhaler Inhale 2 puffs into the lungs every 6 (six) hours as needed for wheezing or shortness of breath.   Yes Historical Provider, MD  budesonide-formoterol (SYMBICORT) 80-4.5 MCG/ACT inhaler Take 2 puffs first thing in am and then another 2 puffs about 12 hours later.   as needed 12/17/15  Yes Tanda Rockers, MD  cinacalcet (SENSIPAR) 30 MG tablet Take 30 mg by mouth every other day.   Yes Historical Provider, MD  colchicine 0.6 MG tablet TAKE ONE TABLET BY MOUTH EVERY DAY 09/25/14  Yes Historical Provider, MD  Darbepoetin Alfa (ARANESP) 60 MCG/0.3ML SOSY injection Inject 0.3 mLs (60 mcg total) into the vein every Thursday with hemodialysis. Patient taking differently: Inject 60 mcg into the vein every Wednesday with hemodialysis.   02/09/15  Yes Barton Dubois, MD  doxercalciferol (HECTOROL) 0.5 MCG capsule Take 0.5 mcg by mouth every Monday, Wednesday, and Friday with hemodialysis. Every Mon / Wed / Fri  w/dialysis   Yes Historical Provider, MD  ezetimibe (ZETIA) 10 MG tablet Take 10 mg by mouth daily.    Yes Historical Provider, MD  famotidine (PEPCID) 20 MG tablet One at bedtime Patient taking differently: Take 20 mg by mouth at bedtime. One at bedtime 01/06/16  Yes Tanda Rockers, MD  gabapentin (NEURONTIN) 100 MG capsule Take 100 mg by mouth at bedtime.  12/22/14 01/11/17 Yes Historical Provider, MD  levothyroxine (SYNTHROID, LEVOTHROID) 100 MCG tablet Take 100 mcg by mouth daily.     Yes Historical Provider, MD  midodrine (PROAMATINE) 10 MG tablet Take 10 mg by mouth 3 (three) times daily.    Yes Historical Provider, MD  multivitamin (RENA-VIT) TABS tablet Take 1 tablet by mouth daily.  Yes Historical Provider, MD  pantoprazole (PROTONIX) 40 MG tablet Take 1 tablet (40 mg total) by mouth daily. Take 30-60 min before first meal of the day 12/17/15  Yes Tanda Rockers, MD  polyethylene glycol [facility] / GLYCOLAX) packet Take 17 g by mouth daily. 02/09/15  Yes Barton Dubois, MD  Vitamin D, Ergocalciferol, (DRISDOL) 50000 UNITS CAPS Take 50,000 Units by mouth every Monday.    Yes Historical Provider, MD  warfarin (COUMADIN) 5 MG tablet Take 7.5mg  daily on M-W-F abd then 5 mg daily the rest of the week Patient taking differently: Take 5-7.5 mg by mouth as directed. Take 1 tablet (5 MG) on Wednesday, but take 1.5 tablets (7.5 MG) on all other days. 02/09/15  Yes Barton Dubois, MD  Ferric Citrate (AURYXIA) 1 GM 210 MG(Fe) TABS Take 2 tablets by mouth 3 (three) times daily before meals.    Historical Provider, MD  Ferric Citrate (AURYXIA) 1 GM 210 MG(Fe) TABS Take 1 tablet by mouth with snacks.    Historical Provider, MD  HYDROcodone-acetaminophen (NORCO) 5-325 MG tablet Take 1 tablet by mouth every 6 (six) hours as needed for moderate  pain. 01/22/16   Netta Cedars, MD   Current Facility-Administered Medications  Medication Dose Route Frequency Provider Last Rate Last Dose  . 0.9 %  sodium chloride infusion   Intravenous Continuous Netta Cedars, MD 50 mL/hr at 01/22/16 1345    . acetaminophen (TYLENOL) tablet 650 mg  650 mg Oral Q6H PRN Netta Cedars, MD       Or  . acetaminophen (TYLENOL) suppository 650 mg  650 mg Rectal Q6H PRN Netta Cedars, MD      . albuterol (PROVENTIL) (2.5 MG/3ML) 0.083% nebulizer solution 2.5 mg  2.5 mg Nebulization Q6H PRN Netta Cedars, MD      . Derrill Memo ON 01/23/2016] cinacalcet (SENSIPAR) tablet 30 mg  30 mg Oral Cathlean Sauer, MD      . clindamycin (CLEOCIN) IVPB 600 mg  600 mg Intravenous Q6H Netta Cedars, MD   600 mg at 01/22/16 1537  . [START ON 01/23/2016] colchicine tablet 0.6 mg  0.6 mg Oral Daily Netta Cedars, MD      . Derrill Memo ON 01/27/2016] Darbepoetin Alfa (ARANESP) injection 60 mcg  60 mcg Intravenous Q Wed-HD Netta Cedars, MD      . docusate sodium (COLACE) capsule 100 mg  100 mg Oral BID Netta Cedars, MD   100 mg at 01/22/16 1541  . [START ON 01/25/2016] doxercalciferol (HECTOROL) capsule 0.5 mcg  0.5 mcg Oral Q M,W,F-HD Netta Cedars, MD      . Derrill Memo ON 01/23/2016] ezetimibe (ZETIA) tablet 10 mg  10 mg Oral Daily Netta Cedars, MD      . famotidine (PEPCID) tablet 20 mg  20 mg Oral QHS Netta Cedars, MD      . Ferric Citrate TABS 1 tablet  1 tablet Oral With snacks Netta Cedars, MD      . Ferric Citrate TABS 2 tablet  2 tablet Oral TID AC Netta Cedars, MD      . gabapentin (NEURONTIN) capsule 100 mg  100 mg Oral QHS Netta Cedars, MD      . HYDROcodone-acetaminophen (NORCO/VICODIN) 5-325 MG per tablet 1-2 tablet  1-2 tablet Oral Q4H PRN Netta Cedars, MD      . insulin aspart (novoLOG) injection 0-15 Units  0-15 Units Subcutaneous TID WC Netta Cedars, MD      . insulin aspart (novoLOG) injection 0-5 Units  0-5 Units Subcutaneous  QHS Netta Cedars, MD      . insulin aspart (novoLOG)  injection 4 Units  4 Units Subcutaneous TID WC Netta Cedars, MD      . Derrill Memo ON 01/23/2016] levothyroxine (SYNTHROID, LEVOTHROID) tablet 100 mcg  100 mcg Oral QAC breakfast Netta Cedars, MD      . menthol-cetylpyridinium (CEPACOL) lozenge 3 mg  1 lozenge Oral PRN Netta Cedars, MD       Or  . phenol (CHLORASEPTIC) mouth spray 1 spray  1 spray Mouth/Throat PRN Netta Cedars, MD      . methocarbamol (ROBAXIN) tablet 500 mg  500 mg Oral Q6H PRN Netta Cedars, MD       Or  . methocarbamol (ROBAXIN) 500 mg in dextrose 5 % 50 mL IVPB  500 mg Intravenous Q6H PRN Netta Cedars, MD      . metoCLOPramide (REGLAN) tablet 5-10 mg  5-10 mg Oral Q8H PRN Netta Cedars, MD       Or  . metoCLOPramide (REGLAN) injection 5-10 mg  5-10 mg Intravenous Q8H PRN Netta Cedars, MD      . midodrine (PROAMATINE) tablet 10 mg  10 mg Oral TID Netta Cedars, MD   10 mg at 01/22/16 1541  . mometasone-formoterol (DULERA) 100-5 MCG/ACT inhaler 2 puff  2 puff Inhalation BID Netta Cedars, MD      . morphine 2 MG/ML injection 2 mg  2 mg Intravenous Q2H PRN Netta Cedars, MD      . Derrill Memo ON 01/23/2016] multivitamin (RENA-VIT) tablet 1 tablet  1 tablet Oral Daily Netta Cedars, MD      . ondansetron [facility]) tablet 4 mg  4 mg Oral Q6H PRN Netta Cedars, MD       Or  . ondansetron [facility]) injection 4 mg  4 mg Intravenous Q6H PRN Netta Cedars, MD      . Derrill Memo ON 01/23/2016] pantoprazole (PROTONIX) EC tablet 40 mg  40 mg Oral Daily Netta Cedars, MD      . Derrill Memo ON 01/23/2016] polyethylene glycol (MIRALAX / GLYCOLAX) packet 17 g  17 g Oral Daily Netta Cedars, MD      . polyethylene glycol (MIRALAX / GLYCOLAX) packet 17 g  17 g Oral Daily PRN Netta Cedars, MD      . Derrill Memo ON 01/25/2016] Vitamin D (Ergocalciferol) (DRISDOL) capsule 50,000 Units  50,000 Units Oral Q Edwena Bunde, MD       Labs: Basic Metabolic Panel:  Recent Labs Lab 01/19/16 1052 01/22/16 0944  NA 138 136  K 4.2 3.9  CL 100*  --   CO2 27  --   GLUCOSE 84 83   BUN 30*  --   CREATININE 5.58*  --   CALCIUM 9.5  --    CBC:  Recent Labs Lab 01/19/16 1052 01/22/16 0944  WBC 6.6  --   HGB 12.3 12.2  HCT 38.9 36.0  MCV 97.0  --   PLT 263  --    CBG:  Recent Labs Lab 01/22/16 0828 01/22/16 1215  GLUCAP 80 117*   Studies/Results: Dg Shoulder Left Port  Result Date: 01/22/2016 CLINICAL DATA:  Post LEFT shoulder replacement EXAM: LEFT SHOULDER - 1 VIEW COMPARISON:  Portable exam 1445 hours without priors for comparison FINDINGS: Osseous demineralization. Reverse LEFT shoulder prosthesis without acute fracture or dislocation. AC joint alignment normal. Visualized LEFT ribs intact. Aortic atherosclerotic calcification at arch. IMPRESSION: Reverse LEFT shoulder prosthesis without acute bony abnormalities. Aortic atherosclerosis. Electronically Signed   By: Lavonia Dana  M.D.   On: 01/22/2016 14:52    Dialysis Orders:  [facility], MWF schedule. 4 hours, TDC, 2K/2.5Ca bath, EDW 87kg, BFR 350/DFR 800, no heparin - Hectoral 71mcg IV q HD - No ESA, Hgb above goal.  Assessment/Plan: 1.  S/p L reverse shoulder arthroplasty (10/20): Per orthopedics. 2.  ESRD: Usual MWF schedule. BP/volume/K ok, will plan for next HD on Saturday 10/21, then back to MWF schedule. 3.  Hypertension/volume: BP controlled. No edema. Keep current EDW for now. No heparin with HD, as usual. 4.  Anemia: Hgb 12.2. No ESA for now. 5.  Metabolic bone disease: Last Ca 9.5. Continue binders/VDRA/sensipar. 6.  Nutrition: Continue high protein diet.  Veneta Penton, PA-C 01/22/2016, 4:01 PM  [facility] Pager: [phone]  Pt seen, examined and agree w A/P as above.  Kelly Splinter MD [employer] pager [phone]   01/22/2016, 4:41 PM

## 2016-01-22 NOTE — Anesthesia Postprocedure Evaluation (Signed)
Anesthesia Post Note  Patient: KEIANA TAVELLA  Procedure(s) Performed: Procedure(s) (LRB): LEFT REVERSE SHOULDER ARTHROPLASTY (Left) RIGHT RING FINGER STEROID INJECTION (Right)  Patient location during evaluation: PACU Anesthesia Type: General and Regional Level of consciousness: awake and alert Pain management: pain level controlled Vital Signs Assessment: post-procedure vital signs reviewed and stable Respiratory status: spontaneous breathing, nonlabored ventilation, respiratory function stable and patient connected to nasal cannula oxygen Cardiovascular status: blood pressure returned to baseline and stable Postop Assessment: no signs of nausea or vomiting Anesthetic complications: no    Last Vitals:  Vitals:   01/22/16 1230 01/22/16 1239  BP:    Pulse: 86 74  Resp: 13 19  Temp:      Last Pain:  Vitals:   01/22/16 1239  PainSc: 0-No pain                 Catalina Gravel

## 2016-01-22 NOTE — Progress Notes (Signed)
SBP 93-106, chronic a fib in the 60's, pt alert, mentating clearly, denies pain. Dr Gifford Shave fully updated-OK to tx pt to floor.

## 2016-01-22 NOTE — Transfer of Care (Signed)
Immediate Anesthesia Transfer of Care Note  Patient: [name]  Procedure(s) Performed: Procedure(s): LEFT REVERSE SHOULDER ARTHROPLASTY (Left) RIGHT RING FINGER STEROID INJECTION (Right)  Patient Location: PACU  Anesthesia Type:GA combined with regional for post-op pain  Level of Consciousness: awake, alert  and patient cooperative  Airway & Oxygen Therapy: Patient Spontanous Breathing and Patient connected to nasal cannula oxygen  Post-op Assessment: Report given to RN and Post -op Vital signs reviewed and stable  Post vital signs: Reviewed and stable  Last Vitals:  Vitals:   01/22/16 1000 01/22/16 1207  BP: 133/86 106/80  Pulse: 72 85  Resp: 13 13  Temp:  36.2 C    Last Pain: There were no vitals filed for this visit.       Complications: No apparent anesthesia complications

## 2016-01-22 NOTE — Anesthesia Procedure Notes (Signed)
Procedure Name: Intubation Date/Time: 01/22/2016 10:29 AM Performed by: Salli Quarry Aina Rossbach Pre-anesthesia Checklist: Patient identified, Emergency Drugs available, Suction available and Patient being monitored Patient Re-evaluated:Patient Re-evaluated prior to inductionOxygen Delivery Method: Circle System Utilized Preoxygenation: Pre-oxygenation with 100% oxygen Intubation Type: IV induction Ventilation: Mask ventilation without difficulty Laryngoscope Size: Mac and 3 Grade View: Grade I Tube type: Oral Tube size: 7.0 mm Number of attempts: 1 Airway Equipment and Method: Stylet Placement Confirmation: ETT inserted through vocal cords under direct vision,  positive ETCO2 and breath sounds checked- equal and bilateral Secured at: 22 cm Tube secured with: Tape Dental Injury: Teeth and Oropharynx as per pre-operative assessment

## 2016-01-22 NOTE — Progress Notes (Signed)
ANTICOAGULATION CONSULT NOTE - Initial Consult  Pharmacy Consult for warfarin Indication: atrial fibrillation  Patient Measurements: Height: 5\' 5"  (165.1 cm) Weight: 196 lb (88.9 kg) IBW/kg (Calculated) : 57  Vital Signs: Temp: 97.5 F (36.4 C) (10/20 1315) BP: 98/64 (10/20 1315) Pulse Rate: 68 (10/20 1315)  Labs:  Recent Labs  01/22/16 0944 01/22/16 1006 01/22/16 1633  HGB 12.2  --   --   HCT 36.0  --   --   APTT  --  165* 36  LABPROT  --  14.0  --   INR  --  1.08  --     Estimated Creatinine Clearance: 9.6 mL/min (by C-G formula based on SCr of 5.58 mg/dL (H)).   Medical History: Past Medical History:  Diagnosis Date  . A-fib ([place])   . Anemia   . Arthritis   . Diabetes mellitus    "diet controlled"  . Diabetes mellitus without complication ([place])   . ESRD (end stage renal disease) ([place])    dialysis MWF  . GERD (gastroesophageal reflux disease)   . Gout   . History of blood transfusion   . HLD (hyperlipidemia)   . HOH (hard of hearing)    left ear  . Hypertension    hypotensive -since starting dialysis  . Hypothyroidism   . PAF (paroxysmal atrial fibrillation) ([place])    a. Echo 11/16:  Mild LVH, EF 55-60%, normal wall motion, MAC, mild MR, severe LAE (49 ml/m2), mild RVE, normal RVSF, mild RAE, mild TR, PASP 24 mmHg;  CHADS2-VASc: 4 >> Coumadin followed by PCP  . Renal disorder      Assessment: POD #0 s/p L reverse shoulder arthroplasty. Pt is on warfarin PTA for AFib. Current INR 1, warfarin has been held perioperatively since 01/16/16 and will be resumed tonight without a bridge. PTA warfarin: 5 mg qWed, 7.5 mg all other days  Goal of Therapy:  INR 2-3 Monitor platelets by anticoagulation protocol: Yes   Plan:  -Warfarin 10 mg po x1 -Daily INR  [name], [name] 01/22/2016,5:20 PM

## 2016-01-22 NOTE — Progress Notes (Signed)
IV connected to dialysis cath., with using aseptic technique, withdrew 10 mls. Of blood, wasted, retrieved then bld. For istat, clotting studies & [id number]. NS infusing

## 2016-01-22 NOTE — Anesthesia Procedure Notes (Signed)
Anesthesia Regional Block:  Interscalene brachial plexus block  Pre-Anesthetic Checklist: ,, timeout performed, Correct Patient, Correct Site, Correct Laterality, Correct Procedure, Correct Position, site marked, Risks and benefits discussed,  Surgical consent,  Pre-op evaluation,  At surgeon's request and post-op pain management  Laterality: Left  Prep: chloraprep       Needles:  Injection technique: Single-shot  Needle Type: Echogenic Stimulator Needle     Needle Length: 5cm 5 cm Needle Gauge: 22 and 22 G    Additional Needles:  Procedures: ultrasound guided (picture in chart) Interscalene brachial plexus block Narrative:  Injection made incrementally with aspirations every 5 mL.  Performed by: Personally  Anesthesiologist: Catalina Gravel  Additional Notes: Functioning IV was confirmed and monitors were applied.  A 38mm 22ga Arrow echogenic stimulator needle was used. Sterile prep and drape, hand hygiene, and sterile gloves were used.  Negative aspiration and negative test dose prior to incremental administration of local anesthetic. The patient tolerated the procedure well.  Ultrasound guidance: relevent anatomy identified, needle position confirmed, local anesthetic spread visualized around nerve(s), vascular puncture avoided.  Image printed for medical record.

## 2016-01-23 LAB — HEMOGLOBIN AND HEMATOCRIT, BLOOD
HCT: 28.5 % — ABNORMAL LOW (ref 36.0–46.0)
Hemoglobin: 9.3 g/dL — ABNORMAL LOW (ref 12.0–15.0)

## 2016-01-23 LAB — BASIC METABOLIC PANEL
Anion gap: 12 (ref 5–15)
BUN: 55 mg/dL — ABNORMAL HIGH (ref 6–20)
CO2: 22 mmol/L (ref 22–32)
Calcium: 8.4 mg/dL — ABNORMAL LOW (ref 8.9–10.3)
Chloride: 97 mmol/L — ABNORMAL LOW (ref 101–111)
Creatinine, Ser: 7.96 mg/dL — ABNORMAL HIGH (ref 0.44–1.00)
GFR calc Af Amer: 5 mL/min — ABNORMAL LOW (ref >60)
GFR calc non Af Amer: 4 mL/min — ABNORMAL LOW (ref >60)
Glucose, Bld: 105 mg/dL — ABNORMAL HIGH (ref 65–99)
Potassium: 4.4 mmol/L (ref 3.5–5.1)
Sodium: 131 mmol/L — ABNORMAL LOW (ref 135–145)

## 2016-01-23 LAB — GLUCOSE, CAPILLARY
Glucose-Capillary: 95 mg/dL (ref 65–99)
Glucose-Capillary: 107 mg/dL — ABNORMAL HIGH (ref 65–99)
Glucose-Capillary: 88 mg/dL (ref 65–99)
Glucose-Capillary: 149 mg/dL — ABNORMAL HIGH (ref 65–99)

## 2016-01-23 LAB — CBC
HCT: 29 % — ABNORMAL LOW (ref 36.0–46.0)
Hemoglobin: 9.5 g/dL — ABNORMAL LOW (ref 12.0–15.0)
MCH: 31.3 pg (ref 26.0–34.0)
MCHC: 32.8 g/dL (ref 30.0–36.0)
MCV: 95.4 fL (ref 78.0–100.0)
Platelets: 219 10*3/uL (ref 150–400)
RBC: 3.04 MIL/uL — ABNORMAL LOW (ref 3.87–5.11)
RDW: 15.5 % (ref 11.5–15.5)
WBC: 6.7 10*3/uL (ref 4.0–10.5)

## 2016-01-23 LAB — PROTIME-INR
INR: 1.12
Prothrombin Time: 14.4 seconds (ref 11.4–15.2)

## 2016-01-23 MED ORDER — LIDOCAINE HCL (PF) 1 % IJ SOLN
5.0000 mL | INTRAMUSCULAR | Status: DC | PRN
Start: 2016-01-23 — End: 2016-01-23

## 2016-01-23 MED ORDER — HEPARIN SODIUM (PORCINE) 1000 UNIT/ML DIALYSIS: 1000.0000 [IU] | INTRAMUSCULAR | Status: DC | PRN

## 2016-01-23 MED ORDER — ACETAMINOPHEN 325 MG PO TABS
ORAL_TABLET | ORAL | Status: AC
  Filled 2016-01-23: qty 2

## 2016-01-23 MED ORDER — MIDODRINE HCL 5 MG PO TABS
ORAL_TABLET | ORAL | Status: AC
  Administered 2016-01-23: 10 mg via ORAL
  Filled 2016-01-23: qty 2

## 2016-01-23 MED ORDER — ALTEPLASE 2 MG IJ SOLR: 2.0000 mg | Freq: Once | INTRAMUSCULAR | Status: DC | PRN

## 2016-01-23 MED ORDER — SODIUM CHLORIDE 0.9 % IV SOLN: 100.0000 mL | INTRAVENOUS | Status: DC | PRN

## 2016-01-23 MED ORDER — PENTAFLUOROPROP-TETRAFLUOROETH EX AERO: 1.0000 "application " | INHALATION_SPRAY | CUTANEOUS | Status: DC | PRN

## 2016-01-23 MED ORDER — DOXERCALCIFEROL 4 MCG/2ML IV SOLN
INTRAVENOUS | Status: AC
  Administered 2016-01-23: 3 ug via INTRAVENOUS
  Filled 2016-01-23: qty 2

## 2016-01-23 MED ORDER — HYDROCODONE-ACETAMINOPHEN 5-325 MG PO TABS
ORAL_TABLET | ORAL | Status: AC
  Administered 2016-01-23: 2 via ORAL
  Filled 2016-01-23: qty 2

## 2016-01-23 NOTE — Evaluation (Signed)
Occupational Therapy Evaluation Patient Details Name: AMEA MCPHAIL MRN: [id number] DOB: 06-21-1940 Today's Date: 01/23/2016    History of Present Illness s/p L reverse TSA.  PMH: R reverse TSA, B TKA, ESRD, DM, GERD, afib.   Clinical Impression   Pt sponge bathes and dresses herself at baseline. She typically walks with a rollator, but will be using a hemiwalker now that she is NWB L UE. Pt presents with minimal shoulder pain. Educated pt and daughter in AROM L elbow>hand, sling use, positioning L UE in bed and chair and compensatory strategies for ADL. Handout provided to reinforce. Pt plans to go home with her daughter tomorrow and then transition back home with son and daughter in law who can provide 24 hour close supervision for all mobility. Will follow acutely.   Follow Up Recommendations  Supervision/Assistance - 24 hour (follow up as per MD order)    Equipment Recommendations  None recommended by OT    Recommendations for Other Services       Precautions / Restrictions Precautions Precautions: Fall;Shoulder Type of Shoulder Precautions: conservative protocol Shoulder Interventions: Shoulder sling/immobilizer;Off for dressing/bathing/exercises Precaution Booklet Issued: Yes (comment) Precaution Comments: elbow to hand exercises only Required Braces or Orthoses: Sling Restrictions Weight Bearing Restrictions: Yes LUE Weight Bearing: Non weight bearing      Mobility Bed Mobility Overal bed mobility: Needs Assistance Bed Mobility: Supine to Sit;Sit to Supine     Supine to sit: Min assist Sit to supine: Min assist   General bed mobility comments: pt plans to sleep in her lift chair, instructed pt to get up to R side when she returns to using bed to avoid weight bearing on L UE  Transfers Overall transfer level: Needs assistance Equipment used: 1 person hand held assist Transfers: Sit to/from Stand Sit to Stand: Min assist         General transfer comment:  instructed daughter to provide close supervision with ambulation as pt is used to using a RW and now will use hemi walker    Balance                                            ADL Overall ADL's : Needs assistance/impaired Eating/Feeding: Set up;Bed level   Grooming: Wash/dry hands;Wash/dry face;Sitting;Minimal assistance   Upper Body Bathing: Moderate assistance;Sitting   Lower Body Bathing: Maximal assistance;Sit to/from stand   Upper Body Dressing : Moderate assistance;Sitting   Lower Body Dressing: Maximal assistance;Sit to/from stand   Toilet Transfer: Minimal assistance;Ambulation   Toileting- Clothing Manipulation and Hygiene: Minimal assistance;Sit to/from stand       Functional mobility during ADLs: Minimal assistance (hand held assist) General ADL Comments: Pt and daughter educated in positioning L UE in bed and chair, sling use, exercise program, NWB status and compensatory strategies for ADL.     Vision     Perception     Praxis      Pertinent Vitals/Pain Pain Assessment: Faces Faces Pain Scale: Hurts a little bit Pain Location: L shoulder Pain Descriptors / Indicators: Sore Pain Intervention(s): Monitored during session;Repositioned;Ice applied;Premedicated before session     Hand Dominance Right   Extremity/Trunk Assessment Upper Extremity Assessment Upper Extremity Assessment: RUE deficits/detail;LUE deficits/detail RUE Deficits / Details: hx of reverse TSA, full ROM, arthritic changes in hand LUE Deficits / Details: performed AROM elbow>hand x 10 LUE: Unable to fully assess  due to immobilization LUE Coordination: decreased gross motor   Lower Extremity Assessment Lower Extremity Assessment: Generalized weakness (baseline)       Communication Communication Communication: HOH   Cognition Arousal/Alertness: Awake/alert Behavior During Therapy: WFL for tasks assessed/performed Overall Cognitive Status: Within Functional  Limits for tasks assessed                     General Comments       Exercises       Shoulder Instructions      Home Living Family/patient expects to be discharged to:: Private residence Living Arrangements: Children (son and daughter in law) Available Help at Discharge: Family;Available 24 hours/day (daughter in law) Type of Home: Mobile home Home Access: Stairs to enter Entrance Stairs-Number of Steps: 1+1 Entrance Stairs-Rails: None Home Layout: One level     Bathroom Shower/Tub:  (pt sponge bathes)   Bathroom Toilet: Standard     Home Equipment: Walker - 4 wheels;Walker - 2 wheels;Wheelchair - manual;Bedside commode (hemi walker, lift chair)          Prior Functioning/Environment Level of Independence: Needs assistance  Gait / Transfers Assistance Needed: walked with rollator ADL's / Homemaking Assistance Needed: sponge bathes, daughter in law cooks and cleans            OT Problem List: Decreased strength;Decreased activity tolerance;Impaired balance (sitting and/or standing);Decreased coordination;Decreased knowledge of use of DME or AE;Obesity;Pain;Impaired UE functional use   OT Treatment/Interventions: Self-care/ADL training;DME and/or AE instruction;Therapeutic activities;Patient/family education;Therapeutic exercise    OT Goals(Current goals can be found in the care plan section) Acute Rehab OT Goals Patient Stated Goal: return home with family OT Goal Formulation: With patient Time For Goal Achievement: 01/30/16 Potential to Achieve Goals: Good ADL Goals Pt Will Perform Grooming: with supervision;standing Pt Will Transfer to Toilet: with supervision;ambulating (with hemiwalker) Pt Will Perform Toileting - Clothing Manipulation and hygiene: with supervision;sit to/from stand Pt/caregiver will Perform Home Exercise Program: Increased ROM;Left upper extremity;Independently (AROM elbow>hand) Additional ADL Goal #1: Pt will perform sponge bathing  and dressing with min assist of her family, adhering to shoulder precautions. Additional ADL Goal #2: Pt and family will be independent in positioning L UE in sling, bed and chair.  OT Frequency: Min 2X/week   Barriers to D/C:            Co-evaluation              End of Session Equipment Utilized During Treatment: Gait belt (sling)  Activity Tolerance: Patient tolerated treatment well Patient left: in bed;with call bell/phone within reach;with family/visitor present   Time: 8889-1694 OT Time Calculation (min): 34 min Charges:  OT General Charges $OT Visit: 1 Procedure OT Evaluation $OT Eval Moderate Complexity: 1 Procedure OT Treatments $Self Care/Home Management : 8-22 mins G-Codes:    Malka So 01/23/2016, 3:38 PM  [phone]

## 2016-01-23 NOTE — Op Note (Signed)
[name], [name] NO.:  [id number]  MEDICAL RECORD NO.:  [id number]  LOCATION:  [id number]                        FACILITY:  [facility]  PHYSICIAN:  Doran Heater. Veverly Fells, M.D. DATE OF BIRTH:  [birth year]  DATE OF PROCEDURE:  01/22/2016 DATE OF DISCHARGE:                              OPERATIVE REPORT   PREOPERATIVE DIAGNOSES: 1. Left shoulder rotator cuff tear arthropathy. 2. Right ring finger triggering.  POSTOPERATIVE DIAGNOSES: 1. Left shoulder rotator cuff tear arthropathy. 2. Right ring finger triggering.  PROCEDURES PERFORMED: 1. Left reverse total shoulder arthroplasty using the DePuy Delta     Xtend prosthesis. 2. Right ring finger steroid injection in flexor sheath using loss-of-     resistance technique.  ATTENDING SURGEON:  Doran Heater. Veverly Fells, MD.  ASSISTANT:  Abbott Pao. Dixon, PA-C, who scrubbed the entire procedure and necessary for satisfactory completion of surgery.  General anesthesia was used plus interscalene block and local anesthesia for the ring finger injection.  INSTRUMENT COUNT:  Correct.  There were no complications.  Perioperative antibiotics were given.  INDICATIONS:  The patient is a 75 year old female with worsening left shoulder pain and dysfunction secondary to rotator cuff tear arthropathy.  The patient has had evidence on x-ray of progressive anterior-superior escape and presents with a dysfunctional shoulder and severe pain desiring reverse total shoulder arthroplasty.  The patient presented today complaining of severe recurrent triggering in her right ring finger despite wearing her hand splint.  She is requesting a cortisone injection into that flexor sheath while she is asleep for her shoulder surgery.  We discussed risks and benefits of both the shoulder surgery as well as the ring finger injection, and the patient elected to proceed with both.  Informed consent obtained.  DESCRIPTION OF THE PROCEDURE:  After an adequate  level of anesthesia achieved, the patient was positioned in the modified beach-chair position.  Left shoulder was correctly identified, sterilely prepped and draped in usual manner.  Time-out called.  We entered the shoulder using standard deltopectoral incision, started at the coracoid process, extending down to the anterior humerus.  Dissection down through subcutaneous tissues using Bovie.  Identified the cephalic vein and took it laterally with the deltoid, pectoralis taken medially.  Conjoined tendon identified and retracted medially.  There was no remaining subscapularis present.  We released the capsule off the inferior neck of the humerus, progressively externally rotating.  Large osteophytes and bone-on-bone arthritis noted.  We then went ahead and extended the shoulder.  The humeral head came completely out of the wound.  There was no remaining rotator cuff in place.  We went ahead and entered the proximal humerus with a 6 mm reamer and reamed up to a size #10.  We then placed our 10 mm intramedullary resection guide and resected with 10 degrees of retroversion.  We then removed excess osteophytes, prepared our proximal portion of the humerus with the metaphyseal reamer for the epi-1 left.  With the reaming performed, we introduced our trial components, the 10 stem, with the epi-1 left metaphyseal components, set on the 0 setting and placed in 10 degrees of retroversion.  We impacted that in place and kept  it during the remainder of the surgery to protect the bone, which was quite soft and clearly osteoporotic.  We retracted the humerus posteriorly, did a 360-degree capsular removal and glenoid labrum removal, identifying completely worn out glenoid face.  We found a center point for that protecting the axillary nerve, drilled our guide pin and reamed for the metaglene and drilled our central peg hole and impacted the metaglene in position.  We were able to get a 48  screw inferiorly, a 30 locked screw proximally into the base of the coracoid, and then 18 posteriorly nonlocked.  We had good purchase with the screws.  We locked the screw mechanisms.  We then went ahead and irrigated thoroughly and introduced a 38 standard glenosphere.  We impacted that and screwed that home until it was firmly seated on the base plate.  Next, we irrigated thoroughly the shoulder.  We then reduced the shoulder with a 38 +3 poly trial, which provided excellent stability and appropriate tension.  We then removed the trial components from the humeral side.  We irrigated it thoroughly and did a hybrid press-fit and cementing.  We mixed DePuy 1 cement on the back table.  We went ahead and put together our 10 HA stem and 10 epi-1 left metaphysis set on the 0 setting, and then with that securely attached, we first placed a small amount of cement around the metaphyseal area basically in the junction of metaphysis and diaphysis.  Yet, we left that entire metaphyseal bone area for the HA coating to heal together.  So, we impacted the stem in about 10 degrees of retroversion, held that until cement was hardened.  We then went ahead and placed a 38, +3 real poly, impacted that, reduced the shoulder with a nice little pop, ranged the shoulder.  We had negative gapping with a pull on the elbow and no gapping with external rotation and conjoined tendon tight.  We irrigated thoroughly, did a final finger sweep to make sure there was no other loose debris or loose bodies and then sutured the deltopectoral interval with 0 Vicryl suture followed by 2-0 Vicryl subcutaneous closure and 4-0 Monocryl for skin.  Steri-Strips applied followed by sterile dressing. The patient tolerated the surgery well.     Doran Heater. Veverly Fells, M.D.     SRN/MEDQ  D:  01/22/2016  T:  01/23/2016  Job:  [id number]

## 2016-01-23 NOTE — Progress Notes (Addendum)
Subjective: 1 Day Post-Op Procedure(s) (LRB): LEFT REVERSE SHOULDER ARTHROPLASTY (Left) RIGHT RING FINGER STEROID INJECTION (Right) Patient reports pain as moderate.  Reports block has worn off. Hand no longer tingling. Reporting pain in the shoulder this morning. Does not feel ready to go home today. Seen by myself and Dr. Gladstone Lighter in AM rounds.  Objective: Vital signs in last 24 hours: Temp:  [97.2 F (36.2 C)-98.2 F (36.8 C)] 97.6 F (36.4 C) (10/21 0516) Pulse Rate:  [68-86] 71 (10/21 0516) Resp:  [10-20] 16 (10/21 0516) BP: (90-133)/(56-96) 100/68 (10/21 0516) SpO2:  [94 %-100 %] 97 % (10/21 0516) Weight:  [88.9 kg (196 lb)] 88.9 kg (196 lb) (10/20 0833)  Intake/Output from previous day: 10/20 0701 - 10/21 0700 In: 1130 [P.O.:30; I.V.:550; IV Piggyback:550] Out: 200 [Blood:200] Intake/Output this shift: No intake/output data recorded.   Recent Labs  01/22/16 0944 01/23/16 0328  HGB 12.2 9.3*    Recent Labs  01/22/16 0944 01/23/16 0328  HCT 36.0 28.5*    Recent Labs  01/22/16 0944 01/23/16 0328  NA 136 131*  K 3.9 4.4  CL  --  97*  CO2  --  22  BUN  --  55*  CREATININE  --  7.96*  GLUCOSE 83 105*  CALCIUM  --  8.4*    Recent Labs  01/22/16 1006 01/23/16 0328  INR 1.08 1.12    Neurologically intact ABD soft Neurovascular intact Sensation intact distally Intact pulses distally Dorsiflexion/Plantar flexion intact Incision: dressing C/D/I and no drainage No cellulitis present Compartment soft no sign of DVT  Assessment/Plan: 1 Day Post-Op Procedure(s) (LRB): LEFT REVERSE SHOULDER ARTHROPLASTY (Left) RIGHT RING FINGER STEROID INJECTION (Right) Advance diet Up with therapy D/C IV fluids  Pain control today Recheck CMET tomorrow Plan D/C tomorrow when pain better controlled and dressing change prior to D/C  BISSELL, JACLYN M. 01/23/2016, 8:21 AM

## 2016-01-23 NOTE — Progress Notes (Signed)
OT Cancellation Note  Patient Details Name: [name] MRN: [id number] DOB: [birth year]   Cancelled Treatment:    Reason Eval/Treat Not Completed: Patient at procedure or test/ unavailable (HD). Will follow.  Malka So 01/23/2016, 10:31 AM  [phone]

## 2016-01-23 NOTE — Progress Notes (Signed)
ANTICOAGULATION CONSULT NOTE - Follow Up Consult  Pharmacy Consult for Coumadin Indication: atrial fibrillation  Allergies  Allergen Reactions  . Amoxicillin Rash    Has patient had a PCN reaction causing immediate rash, facial/tongue/throat swelling, SOB or lightheadedness with hypotension:YES Has patient had a PCN reaction causing severe rash involving mucus membranes or skin necrosis: Yes Has patient had a PCN reaction that required hospitalization No Has patient had a PCN reaction occurring within the last 10 years: Yes If all of the above answers are "NO", then may proceed with Cephalosporin use.   . Sulfa Drugs Cross Reactors Hives and Itching  . Percocet [Oxycodone-Acetaminophen] Itching and Rash    Did not happen last time she took it  . Sulfa Antibiotics Hives    Patient Measurements: Height: 5\' 5"  (165.1 cm) Weight: 201 lb 11.5 oz (91.5 kg) IBW/kg (Calculated) : 57  Vital Signs: Temp: 98.1 F (36.7 C) (10/21 0815) Temp Source: Oral (10/21 0815) BP: 78/59 (10/21 1130) Pulse Rate: 87 (10/21 1130)  Labs:  Recent Labs  01/22/16 0944 01/22/16 1006 01/22/16 1633 01/23/16 0328 01/23/16 0852  HGB 12.2  --   --  9.3* 9.5*  HCT 36.0  --   --  28.5* 29.0*  PLT  --   --   --   --  219  APTT  --  165* 36  --   --   LABPROT  --  14.0  --  14.4  --   INR  --  1.08  --  1.12  --   CREATININE  --   --   --  7.96*  --     Estimated Creatinine Clearance: 6.8 mL/min (by C-G formula based on SCr of 7.96 mg/dL (H)).   Medications:  Scheduled:  . HYDROcodone-acetaminophen      . cinacalcet  30 mg Oral QODAY  . colchicine  0.6 mg Oral Daily  . [START ON 01/27/2016] Darbepoetin Alfa  60 mcg Intravenous Q Wed-HD  . docusate sodium  100 mg Oral BID  . doxercalciferol  3 mcg Intravenous Q T,Th,Sa-HD  . ezetimibe  10 mg Oral Daily  . famotidine  20 mg Oral QHS  . Ferric Citrate  1 tablet Oral With snacks  . Ferric Citrate  2 tablet Oral TID AC  . gabapentin  100 mg Oral  QHS  . insulin aspart  0-15 Units Subcutaneous TID WC  . insulin aspart  0-5 Units Subcutaneous QHS  . insulin aspart  4 Units Subcutaneous TID WC  . levothyroxine  100 mcg Oral QAC breakfast  . midodrine  10 mg Oral TID  . mometasone-formoterol  2 puff Inhalation BID  . multivitamin  1 tablet Oral Daily  . pantoprazole  40 mg Oral Daily  . polyethylene glycol  17 g Oral Daily  . [START ON 01/25/2016] Vitamin D (Ergocalciferol)  50,000 Units Oral Q Mon  . Warfarin - Pharmacist Dosing Inpatient   Does not apply q1800    Assessment: 75yo female s/p L-reverse shoulder arthroplasty with Coumadin resumed.  INR 1.12 today.  No bleeding noted.  Hg stable at 9.5 post-op.    Goal of Therapy:  INR 2-3 Monitor platelets by anticoagulation protocol: Yes   Plan:  Repeat Coumadin 10mg  today Plan to resume home dose tomorrow Daily INR Watch for s/s of bleeding  Gracy Bruins, PharmD [facility]

## 2016-01-23 NOTE — Procedures (Signed)
Stable, BP's soft , up 4kg above dry wt but also on midodrine at home.  Plan UF 2-3 L as tolerated.  Is alert and stable on HD.  Postop pain only complaint.  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  No other perm access since that happened.    I was present at this dialysis session, have reviewed the session itself and made  appropriate changes Kelly Splinter MD [place] pager [phone]   01/23/2016, 10:07 AM

## 2016-01-24 LAB — COMPREHENSIVE METABOLIC PANEL
ALT: 19 U/L (ref 14–54)
AST: 27 U/L (ref 15–41)
Albumin: 3.2 g/dL — ABNORMAL LOW (ref 3.5–5.0)
Alkaline Phosphatase: 49 U/L (ref 38–126)
Anion gap: 9 (ref 5–15)
BUN: 19 mg/dL (ref 6–20)
CO2: 27 mmol/L (ref 22–32)
Calcium: 8.3 mg/dL — ABNORMAL LOW (ref 8.9–10.3)
Chloride: 96 mmol/L — ABNORMAL LOW (ref 101–111)
Creatinine, Ser: 4.75 mg/dL — ABNORMAL HIGH (ref 0.44–1.00)
GFR calc Af Amer: 9 mL/min — ABNORMAL LOW (ref >60)
GFR calc non Af Amer: 8 mL/min — ABNORMAL LOW (ref >60)
Glucose, Bld: 104 mg/dL — ABNORMAL HIGH (ref 65–99)
Potassium: 3.7 mmol/L (ref 3.5–5.1)
Sodium: 132 mmol/L — ABNORMAL LOW (ref 135–145)
Total Bilirubin: 0.7 mg/dL (ref 0.3–1.2)
Total Protein: 5.7 g/dL — ABNORMAL LOW (ref 6.5–8.1)

## 2016-01-24 LAB — GLUCOSE, CAPILLARY: Glucose-Capillary: 117 mg/dL — ABNORMAL HIGH (ref 65–99)

## 2016-01-24 LAB — PROTIME-INR
INR: 1.13
Prothrombin Time: 14.6 seconds (ref 11.4–15.2)

## 2016-01-24 MED ORDER — WARFARIN SODIUM 5 MG PO TABS
10.0000 mg | ORAL_TABLET | ORAL | Status: AC
  Administered 2016-01-24: 10 mg via ORAL
  Filled 2016-01-24: qty 2

## 2016-01-24 NOTE — Discharge Summary (Signed)
Physician Discharge Summary   Patient ID: [name] MRN: [id number] DOB/AGE: Oct 29, 1940 75 y.o.  Admit date: 01/22/2016 Discharge date: 01/24/2016  Admission Diagnoses:  Active Problems:   S/P shoulder replacement, left   Discharge Diagnoses:  Same   Surgeries: Procedure(s): LEFT REVERSE SHOULDER ARTHROPLASTY RIGHT RING FINGER STEROID INJECTION on 01/22/2016   Consultants: PT/OT  Discharged Condition: Stable  Hospital Course: [name] is an 75 y.o. female who was admitted 01/22/2016 with a chief complaint of left shoulder pain, and found to have a diagnosis of left shoulder rotator cuff arthropathy.  They were brought to the operating room on 01/22/2016 and underwent the above named procedures.    The patient had an uncomplicated hospital course and was stable for discharge.  Recent vital signs:  Vitals:   01/24/16 0533 01/24/16 0535  BP: (!) 91/55 90/60  Pulse: 77   Resp:    Temp: 97.3 F (36.3 C)     Recent laboratory studies:  Results for orders placed or performed during the hospital encounter of 01/22/16  Glucose, capillary  Result Value Ref Range   Glucose-Capillary 80 65 - 99 mg/dL  Protime-INR  Result Value Ref Range   Prothrombin Time 14.0 11.4 - 15.2 seconds   INR 1.08   APTT  Result Value Ref Range   aPTT 165 (HH) 24 - 36 seconds  Hemoglobin A1c  Result Value Ref Range   Hgb A1c MFr Bld 4.7 (L) 4.8 - 5.6 %   Mean Plasma Glucose 88 mg/dL  Glucose, capillary  Result Value Ref Range   Glucose-Capillary 117 (H) 65 - 99 mg/dL   Comment 1 Notify RN   APTT  Result Value Ref Range   aPTT 36 24 - 36 seconds  Glucose, capillary  Result Value Ref Range   Glucose-Capillary 140 (H) 65 - 99 mg/dL   Comment 1 Notify RN    Comment 2 Document in Chart   Hemoglobin and hematocrit, blood  Result Value Ref Range   Hemoglobin 9.3 (L) 12.0 - 15.0 g/dL   HCT 28.5 (L) 36.0 - 56.3 %  Basic metabolic panel  Result Value Ref Range   Sodium 131 (L) 135 -  145 mmol/L   Potassium 4.4 3.5 - 5.1 mmol/L   Chloride 97 (L) 101 - 111 mmol/L   CO2 22 22 - 32 mmol/L   Glucose, Bld 105 (H) 65 - 99 mg/dL   BUN 55 (H) 6 - 20 mg/dL   Creatinine, Ser 7.96 (H) 0.44 - 1.00 mg/dL   Calcium 8.4 (L) 8.9 - 10.3 mg/dL   GFR calc non Af Amer 4 (L) >60 mL/min   GFR calc Af Amer 5 (L) >60 mL/min   Anion gap 12 5 - 15  Protime-INR  Result Value Ref Range   Prothrombin Time 14.4 11.4 - 15.2 seconds   INR 1.12   Glucose, capillary  Result Value Ref Range   Glucose-Capillary 115 (H) 65 - 99 mg/dL  Glucose, capillary  Result Value Ref Range   Glucose-Capillary 95 65 - 99 mg/dL  CBC  Result Value Ref Range   WBC 6.7 4.0 - 10.5 K/uL   RBC 3.04 (L) 3.87 - 5.11 MIL/uL   Hemoglobin 9.5 (L) 12.0 - 15.0 g/dL   HCT 29.0 (L) 36.0 - 46.0 %   MCV 95.4 78.0 - 100.0 fL   MCH 31.3 26.0 - 34.0 pg   MCHC 32.8 30.0 - 36.0 g/dL   RDW 15.5 11.5 - 15.5 %  Platelets 219 150 - 400 K/uL  Glucose, capillary  Result Value Ref Range   Glucose-Capillary 107 (H) 65 - 99 mg/dL  Glucose, capillary  Result Value Ref Range   Glucose-Capillary 88 65 - 99 mg/dL  Protime-INR  Result Value Ref Range   Prothrombin Time 14.6 11.4 - 15.2 seconds   INR 1.13   Comprehensive metabolic panel  Result Value Ref Range   Sodium 132 (L) 135 - 145 mmol/L   Potassium 3.7 3.5 - 5.1 mmol/L   Chloride 96 (L) 101 - 111 mmol/L   CO2 27 22 - 32 mmol/L   Glucose, Bld 104 (H) 65 - 99 mg/dL   BUN 19 6 - 20 mg/dL   Creatinine, Ser 4.75 (H) 0.44 - 1.00 mg/dL   Calcium 8.3 (L) 8.9 - 10.3 mg/dL   Total Protein 5.7 (L) 6.5 - 8.1 g/dL   Albumin 3.2 (L) 3.5 - 5.0 g/dL   AST 27 15 - 41 U/L   ALT 19 14 - 54 U/L   Alkaline Phosphatase 49 38 - 126 U/L   Total Bilirubin 0.7 0.3 - 1.2 mg/dL   GFR calc non Af Amer 8 (L) >60 mL/min   GFR calc Af Amer 9 (L) >60 mL/min   Anion gap 9 5 - 15  Glucose, capillary  Result Value Ref Range   Glucose-Capillary 149 (H) 65 - 99 mg/dL  I-STAT 4, (NA,K, GLUC, HGB,HCT)    Result Value Ref Range   Sodium 136 135 - 145 mmol/L   Potassium 3.9 3.5 - 5.1 mmol/L   Glucose, Bld 83 65 - 99 mg/dL   HCT 36.0 36.0 - 46.0 %   Hemoglobin 12.2 12.0 - 15.0 g/dL    Discharge Medications:     Medication List    TAKE these medications   ACCU-CHEK AVIVA PLUS test strip Generic drug:  glucose blood   albuterol 108 (90 Base) MCG/ACT inhaler Commonly known as:  PROVENTIL HFA;VENTOLIN HFA Inhale 2 puffs into the lungs every 6 (six) hours as needed for wheezing or shortness of breath.   AURYXIA 1 GM 210 MG(Fe) Tabs Generic drug:  Ferric Citrate Take 2 tablets by mouth 3 (three) times daily before meals.   AURYXIA 1 GM 210 MG(Fe) Tabs Generic drug:  Ferric Citrate Take 1 tablet by mouth with snacks.   budesonide-formoterol 80-4.5 MCG/ACT inhaler Commonly known as:  SYMBICORT Take 2 puffs first thing in am and then another 2 puffs about 12 hours later.   as needed   cinacalcet 30 MG tablet Commonly known as:  SENSIPAR Take 30 mg by mouth every other day.   colchicine 0.6 MG tablet TAKE ONE TABLET BY MOUTH EVERY DAY   Darbepoetin Alfa 60 MCG/0.3ML Sosy injection Commonly known as:  ARANESP Inject 0.3 mLs (60 mcg total) into the vein every Thursday with hemodialysis. What changed:  when to take this   doxercalciferol 0.5 MCG capsule Commonly known as:  HECTOROL Take 0.5 mcg by mouth every Monday, Wednesday, and Friday with hemodialysis. Every Mon / Wed / Fri  w/dialysis   ezetimibe 10 MG tablet Commonly known as:  ZETIA Take 10 mg by mouth daily.   famotidine 20 MG tablet Commonly known as:  PEPCID One at bedtime What changed:  how much to take  how to take this  when to take this  additional instructions   HYDROcodone-acetaminophen 5-325 MG tablet Commonly known as:  NORCO Take 1 tablet by mouth every 6 (six) hours as needed for  moderate pain.   levothyroxine 100 MCG tablet Commonly known as:  SYNTHROID, LEVOTHROID Take 100 mcg by mouth  daily.   midodrine 10 MG tablet Commonly known as:  PROAMATINE Take 10 mg by mouth 3 (three) times daily.   multivitamin Tabs tablet Take 1 tablet by mouth daily.   NEURONTIN 100 MG capsule Generic drug:  gabapentin Take 100 mg by mouth at bedtime.   pantoprazole 40 MG tablet Commonly known as:  PROTONIX Take 1 tablet (40 mg total) by mouth daily. Take 30-60 min before first meal of the day   polyethylene glycol packet Commonly known as:  MIRALAX / GLYCOLAX Take 17 g by mouth daily.   Vitamin D (Ergocalciferol) 50000 units Caps capsule Commonly known as:  DRISDOL Take 50,000 Units by mouth every Monday.   warfarin 5 MG tablet Commonly known as:  COUMADIN Take 7.5mg  daily on M-W-F abd then 5 mg daily the rest of the week What changed:  how much to take  how to take this  when to take this  additional instructions       Diagnostic Studies: Dg Shoulder Left Port  Result Date: 01/22/2016 CLINICAL DATA:  Post LEFT shoulder replacement EXAM: LEFT SHOULDER - 1 VIEW COMPARISON:  Portable exam 1445 hours without priors for comparison FINDINGS: Osseous demineralization. Reverse LEFT shoulder prosthesis without acute fracture or dislocation. AC joint alignment normal. Visualized LEFT ribs intact. Aortic atherosclerotic calcification at arch. IMPRESSION: Reverse LEFT shoulder prosthesis without acute bony abnormalities. Aortic atherosclerosis. Electronically Signed   By: Lavonia Dana M.D.   On: 01/22/2016 14:52    Disposition: 01-Home or Self Care  Discharge Instructions    Call MD / Call 911    Complete by:  As directed    If you experience chest pain or shortness of breath, CALL 911 and be transported to the hospital emergency room.  If you develope a fever above 101 F, pus (white drainage) or increased drainage or redness at the wound, or calf pain, call your surgeon's office.   Constipation Prevention    Complete by:  As directed    Drink plenty of fluids.  Prune juice  may be helpful.  You may use a stool softener, such as Colace (over the counter) 100 mg twice a day.  Use MiraLax (over the counter) for constipation as needed.   Diet - low sodium heart healthy    Complete by:  As directed    Increase activity slowly as tolerated    Complete by:  As directed       Follow-up Information    NORRIS,STEVEN R, MD. Call in 2 weeks.   Specialty:  Orthopedic Surgery Why:  [phone] Contact information: [address] [place] [address] [phone]            Signed: Ventura Bruns 01/24/2016, 8:28 AM

## 2016-01-24 NOTE — Progress Notes (Signed)
Patient discharged to home, discharge instructions given, patient stated she understood 

## 2016-01-24 NOTE — Progress Notes (Signed)
Called patients phone number, no answer. Called Baldo Ash patients daughter in law, patient is not to take her coumadin tonight, to start it tomorrow per pharmacy, patients daughter in law stated she would let the patient know

## 2016-01-24 NOTE — Progress Notes (Signed)
ANTICOAGULATION CONSULT NOTE - Follow Up Consult  Pharmacy Consult for Coumadin Indication: atrial fibrillation  Allergies  Allergen Reactions  . Amoxicillin Rash    Has patient had a PCN reaction causing immediate rash, facial/tongue/throat swelling, SOB or lightheadedness with hypotension:YES Has patient had a PCN reaction causing severe rash involving mucus membranes or skin necrosis: Yes Has patient had a PCN reaction that required hospitalization No Has patient had a PCN reaction occurring within the last 10 years: Yes If all of the above answers are "NO", then may proceed with Cephalosporin use.   . Sulfa Drugs Cross Reactors Hives and Itching  . Percocet [Oxycodone-Acetaminophen] Itching and Rash    Did not happen last time she took it  . Sulfa Antibiotics Hives    Patient Measurements: Height: 5\' 5"  (165.1 cm) Weight: 198 lb 3.1 oz (89.9 kg) IBW/kg (Calculated) : 57   Vital Signs: Temp: 97.3 F (36.3 C) (10/22 0533) Temp Source: Oral (10/22 0533) BP: 90/60 (10/22 0535) Pulse Rate: 77 (10/22 0533)  Labs:  Recent Labs  01/22/16 0944 01/22/16 1006 01/22/16 1633 01/23/16 0328 01/23/16 0852 01/24/16 0313  HGB 12.2  --   --  9.3* 9.5*  --   HCT 36.0  --   --  28.5* 29.0*  --   PLT  --   --   --   --  219  --   APTT  --  165* 36  --   --   --   LABPROT  --  14.0  --  14.4  --  14.6  INR  --  1.08  --  1.12  --  1.13  CREATININE  --   --   --  7.96*  --  4.75*    Estimated Creatinine Clearance: 11.3 mL/min (by C-G formula based on SCr of 4.75 mg/dL (H)).   Medications:  Scheduled:  . cinacalcet  30 mg Oral QODAY  . colchicine  0.6 mg Oral Daily  . [START ON 01/27/2016] Darbepoetin Alfa  60 mcg Intravenous Q Wed-HD  . docusate sodium  100 mg Oral BID  . doxercalciferol  3 mcg Intravenous Q T,Th,Sa-HD  . ezetimibe  10 mg Oral Daily  . famotidine  20 mg Oral QHS  . Ferric Citrate  1 tablet Oral With snacks  . Ferric Citrate  2 tablet Oral TID AC  .  gabapentin  100 mg Oral QHS  . insulin aspart  0-15 Units Subcutaneous TID WC  . insulin aspart  0-5 Units Subcutaneous QHS  . insulin aspart  4 Units Subcutaneous TID WC  . levothyroxine  100 mcg Oral QAC breakfast  . midodrine  10 mg Oral TID  . mometasone-formoterol  2 puff Inhalation BID  . multivitamin  1 tablet Oral Daily  . pantoprazole  40 mg Oral Daily  . polyethylene glycol  17 g Oral Daily  . [START ON 01/25/2016] Vitamin D (Ergocalciferol)  50,000 Units Oral Q Mon  . warfarin  10 mg Oral NOW  . Warfarin - Pharmacist Dosing Inpatient   Does not apply q1800    Assessment: 75yo female with AFib, s/p L-reverse shoulder arthroplasty & Coumadin resumed 10/20 PM.  INR stable and missed dose 10/21 as order not entered + RN did not see reminder to call if no order.  Will give dose this AM with plans to resume home dose on discharge- next dose 10/23.  No bleeding noted.  No CBC today.  Goal of Therapy:  INR 2-3 Monitor  platelets by anticoagulation protocol: Yes   Plan:  Coumadin 10mg  po now Daily INR Watch for s/s of bleeding   Gracy Bruins, PharmD Clinical Pharmacist [facility]

## 2016-01-24 NOTE — Progress Notes (Signed)
   Subjective: 2 Days Post-Op Procedure(s) (LRB): LEFT REVERSE SHOULDER ARTHROPLASTY (Left) RIGHT RING FINGER STEROID INJECTION (Right)  C/o mild to moderate pain in the left shoulder Ready for d/c home Denies any new symptoms or issues Patient reports pain as moderate.  Objective:   VITALS:   Vitals:   01/24/16 0533 01/24/16 0535  BP: (!) 91/55 90/60  Pulse: 77   Resp:    Temp: 97.3 F (36.3 C)     Left shoulder incision healing well nv intact distally No rashes or edema Sling in place  LABS  Recent Labs  01/22/16 0944 01/23/16 0328 01/23/16 0852  HGB 12.2 9.3* 9.5*  HCT 36.0 28.5* 29.0*  WBC  --   --  6.7  PLT  --   --  219     Recent Labs  01/22/16 0944 01/23/16 0328 01/24/16 0313  NA 136 131* 132*  K 3.9 4.4 3.7  BUN  --  55* 19  CREATININE  --  7.96* 4.75*  GLUCOSE 83 105* 104*     Assessment/Plan: 2 Days Post-Op Procedure(s) (LRB): LEFT REVERSE SHOULDER ARTHROPLASTY (Left) RIGHT RING FINGER STEROID INJECTION (Right) D/c home today F/u in 2 weeks Gentle activity as tolerated    Merla Riches, MPAS, PA-C  01/24/2016, 8:26 AM

## 2016-01-24 NOTE — Progress Notes (Signed)
Occupational Therapy Treatment Patient Details Name: TARISA PAOLA MRN: [id number] DOB: 1941/01/12 Today's Date: 01/24/2016    History of present illness s/p L reverse TSA.  PMH: R reverse TSA, B TKA, ESRD, DM, GERD, afib.   OT comments  Reviewed with pt. And dtr. LUE ROM along with sling education and review.  Note d/c set for later today.  Will be with dtr. Initially then back to live with son and dtr. In law.    Follow Up Recommendations  Supervision/Assistance - 24 hour    Equipment Recommendations  None recommended by OT    Recommendations for Other Services      Precautions / Restrictions Precautions Precautions: Fall;Shoulder Type of Shoulder Precautions: conservative protocol Shoulder Interventions: Shoulder sling/immobilizer;Off for dressing/bathing/exercises Precaution Comments: elbow to hand exercises only Required Braces or Orthoses: Sling Restrictions Weight Bearing Restrictions: Yes LUE Weight Bearing: Non weight bearing       Mobility Bed Mobility                  Transfers                      Balance                                   ADL Overall ADL's : Needs assistance/impaired                 Upper Body Dressing : Moderate assistance;Sitting;With caregiver independent assisting Upper Body Dressing Details (indicate cue type and reason): reviewed correct don/doff and positioning of sling for LUE with dtr.  Lower Body Dressing: Maximal assistance;Sitting/lateral leans Lower Body Dressing Details (indicate cue type and reason): reviewed with dtr. that assistance will be needed for LB  dressing                General ADL Comments: Pt and daughter educated in positioning L UE in bed and chair, sling use, exercise program, NWB status and compensatory strategies for ADL.      Vision                     Perception     Praxis      Cognition   Behavior During Therapy: [facility] for tasks  assessed/performed Overall Cognitive Status: Within Functional Limits for tasks assessed                       Extremity/Trunk Assessment               Exercises Other Exercises Other Exercises: reviewed digit, wrist, and elbow ROM with pt. and dtr. for LUE   Shoulder Instructions       General Comments      Pertinent Vitals/ Pain       Pain Assessment: No/denies pain  Home Living                                          Prior Functioning/Environment              Frequency  Min 2X/week        Progress Toward Goals  OT Goals(current goals can now be found in the care plan section)  Progress towards OT goals: Progressing toward goals     Plan Discharge plan remains  appropriate    Co-evaluation                 End of Session     Activity Tolerance Patient tolerated treatment well   Patient Left in chair;with family/visitor present;with nursing/sitter in room   Nurse Communication          Time: 5374-8270 OT Time Calculation (min): 10 min  Charges: OT General Charges $OT Visit: 1 Procedure OT Treatments $Self Care/Home Management : 8-22 mins  Janice Coffin, COTA/L 01/24/2016, 11:33 AM

## 2016-01-25 ENCOUNTER — Encounter (HOSPITAL_COMMUNITY): Payer: Self-pay | Admitting: Orthopedic Surgery

## 2016-02-09 DIAGNOSIS — E785 Hyperlipidemia, unspecified: Secondary | ICD-10-CM | POA: Insufficient documentation

## 2016-03-04 ENCOUNTER — Other Ambulatory Visit: Payer: Self-pay | Admitting: Internal Medicine

## 2016-03-04 DIAGNOSIS — R05 Cough: Secondary | ICD-10-CM

## 2016-03-04 DIAGNOSIS — R053 Chronic cough: Secondary | ICD-10-CM

## 2016-03-04 MED ORDER — FAMOTIDINE 20 MG PO TABS: 20.0000 mg | ORAL_TABLET | Freq: Every day | ORAL | 0 refills | Status: DC

## 2016-04-12 ENCOUNTER — Emergency Department (HOSPITAL_COMMUNITY): Payer: Medicare Other

## 2016-04-12 ENCOUNTER — Inpatient Hospital Stay (HOSPITAL_COMMUNITY)
Admission: EM | Admit: 2016-04-12 | Discharge: 2016-04-14 | DRG: 871 | Disposition: A | Discharge status: Home / Self Care | Payer: Medicare Other | Attending: Internal Medicine | Admitting: Internal Medicine

## 2016-04-12 ENCOUNTER — Encounter (HOSPITAL_COMMUNITY): Payer: Self-pay | Admitting: Emergency Medicine

## 2016-04-12 DIAGNOSIS — D519 Vitamin B12 deficiency anemia, unspecified: Secondary | ICD-10-CM | POA: Diagnosis present

## 2016-04-12 DIAGNOSIS — I9589 Other hypotension: Secondary | ICD-10-CM | POA: Diagnosis present

## 2016-04-12 DIAGNOSIS — I4581 Long QT syndrome: Secondary | ICD-10-CM | POA: Diagnosis present

## 2016-04-12 DIAGNOSIS — E7800 Pure hypercholesterolemia, unspecified: Secondary | ICD-10-CM | POA: Diagnosis present

## 2016-04-12 DIAGNOSIS — N2581 Secondary hyperparathyroidism of renal origin: Secondary | ICD-10-CM | POA: Diagnosis present

## 2016-04-12 DIAGNOSIS — Z7901 Long term (current) use of anticoagulants: Secondary | ICD-10-CM

## 2016-04-12 DIAGNOSIS — Z79899 Other long term (current) drug therapy: Secondary | ICD-10-CM

## 2016-04-12 DIAGNOSIS — I480 Paroxysmal atrial fibrillation: Secondary | ICD-10-CM | POA: Diagnosis present

## 2016-04-12 DIAGNOSIS — Z96653 Presence of artificial knee joint, bilateral: Secondary | ICD-10-CM | POA: Diagnosis present

## 2016-04-12 DIAGNOSIS — E039 Hypothyroidism, unspecified: Secondary | ICD-10-CM | POA: Diagnosis present

## 2016-04-12 DIAGNOSIS — Z8249 Family history of ischemic heart disease and other diseases of the circulatory system: Secondary | ICD-10-CM

## 2016-04-12 DIAGNOSIS — R509 Fever, unspecified: Secondary | ICD-10-CM | POA: Diagnosis not present

## 2016-04-12 DIAGNOSIS — J9601 Acute respiratory failure with hypoxia: Secondary | ICD-10-CM | POA: Diagnosis present

## 2016-04-12 DIAGNOSIS — I950 Idiopathic hypotension: Secondary | ICD-10-CM

## 2016-04-12 DIAGNOSIS — Z87891 Personal history of nicotine dependence: Secondary | ICD-10-CM

## 2016-04-12 DIAGNOSIS — Z96612 Presence of left artificial shoulder joint: Secondary | ICD-10-CM | POA: Diagnosis present

## 2016-04-12 DIAGNOSIS — E119 Type 2 diabetes mellitus without complications: Secondary | ICD-10-CM

## 2016-04-12 DIAGNOSIS — J209 Acute bronchitis, unspecified: Secondary | ICD-10-CM | POA: Diagnosis present

## 2016-04-12 DIAGNOSIS — Z992 Dependence on renal dialysis: Secondary | ICD-10-CM | POA: Diagnosis not present

## 2016-04-12 DIAGNOSIS — K219 Gastro-esophageal reflux disease without esophagitis: Secondary | ICD-10-CM

## 2016-04-12 DIAGNOSIS — Z880 Allergy status to penicillin: Secondary | ICD-10-CM

## 2016-04-12 DIAGNOSIS — N186 End stage renal disease: Secondary | ICD-10-CM | POA: Diagnosis present

## 2016-04-12 DIAGNOSIS — Z885 Allergy status to narcotic agent status: Secondary | ICD-10-CM

## 2016-04-12 DIAGNOSIS — E118 Type 2 diabetes mellitus with unspecified complications: Secondary | ICD-10-CM | POA: Diagnosis not present

## 2016-04-12 DIAGNOSIS — E1122 Type 2 diabetes mellitus with diabetic chronic kidney disease: Secondary | ICD-10-CM | POA: Diagnosis present

## 2016-04-12 DIAGNOSIS — R791 Abnormal coagulation profile: Secondary | ICD-10-CM | POA: Diagnosis present

## 2016-04-12 DIAGNOSIS — Z96611 Presence of right artificial shoulder joint: Secondary | ICD-10-CM | POA: Diagnosis present

## 2016-04-12 DIAGNOSIS — D631 Anemia in chronic kidney disease: Secondary | ICD-10-CM | POA: Diagnosis present

## 2016-04-12 DIAGNOSIS — A419 Sepsis, unspecified organism: Secondary | ICD-10-CM | POA: Diagnosis present

## 2016-04-12 DIAGNOSIS — Z882 Allergy status to sulfonamides status: Secondary | ICD-10-CM

## 2016-04-12 DIAGNOSIS — Z66 Do not resuscitate: Secondary | ICD-10-CM | POA: Diagnosis present

## 2016-04-12 DIAGNOSIS — I120 Hypertensive chronic kidney disease with stage 5 chronic kidney disease or end stage renal disease: Secondary | ICD-10-CM | POA: Diagnosis present

## 2016-04-12 DIAGNOSIS — I4891 Unspecified atrial fibrillation: Secondary | ICD-10-CM | POA: Diagnosis present

## 2016-04-12 DIAGNOSIS — K2100 Gastro-esophageal reflux disease with esophagitis, without bleeding: Secondary | ICD-10-CM | POA: Insufficient documentation

## 2016-04-12 DIAGNOSIS — I482 Chronic atrial fibrillation: Secondary | ICD-10-CM | POA: Diagnosis present

## 2016-04-12 DIAGNOSIS — H9192 Unspecified hearing loss, left ear: Secondary | ICD-10-CM | POA: Diagnosis present

## 2016-04-12 LAB — CBC WITH DIFFERENTIAL/PLATELET
Basophils Absolute: 0.1 10*3/uL (ref 0.0–0.1)
Basophils Absolute: 0 10*3/uL (ref 0.0–0.1)
Basophils Relative: 1 %
Basophils Relative: 0 %
Eosinophils Absolute: 0 10*3/uL (ref 0.0–0.7)
Eosinophils Absolute: 0 10*3/uL (ref 0.0–0.7)
Eosinophils Relative: 0 %
Eosinophils Relative: 0 %
HCT: 36.2 % (ref 36.0–46.0)
HCT: 31.1 % — ABNORMAL LOW (ref 36.0–46.0)
Hemoglobin: 11.7 g/dL — ABNORMAL LOW (ref 12.0–15.0)
Hemoglobin: 10 g/dL — ABNORMAL LOW (ref 12.0–15.0)
Lymphocytes Relative: 15 %
Lymphocytes Relative: 3 %
Lymphs Abs: 1.9 10*3/uL (ref 0.7–4.0)
Lymphs Abs: 0.3 10*3/uL — ABNORMAL LOW (ref 0.7–4.0)
MCH: 31.6 pg (ref 26.0–34.0)
MCH: 31.5 pg (ref 26.0–34.0)
MCHC: 32.3 g/dL (ref 30.0–36.0)
MCHC: 32.2 g/dL (ref 30.0–36.0)
MCV: 97.8 fL (ref 78.0–100.0)
MCV: 98.1 fL (ref 78.0–100.0)
Monocytes Absolute: 0.6 10*3/uL (ref 0.1–1.0)
Monocytes Absolute: 0.2 10*3/uL (ref 0.1–1.0)
Monocytes Relative: 5 %
Monocytes Relative: 2 %
Neutro Abs: 10.2 10*3/uL — ABNORMAL HIGH (ref 1.7–7.7)
Neutro Abs: 10.1 10*3/uL — ABNORMAL HIGH (ref 1.7–7.7)
Neutrophils Relative %: 79 %
Neutrophils Relative %: 95 %
Platelets: 226 10*3/uL (ref 150–400)
Platelets: 193 10*3/uL (ref 150–400)
RBC: 3.7 MIL/uL — ABNORMAL LOW (ref 3.87–5.11)
RBC: 3.17 MIL/uL — ABNORMAL LOW (ref 3.87–5.11)
RDW: 15.3 % (ref 11.5–15.5)
RDW: 15.3 % (ref 11.5–15.5)
WBC: 12.8 10*3/uL — ABNORMAL HIGH (ref 4.0–10.5)
WBC: 10.7 10*3/uL — ABNORMAL HIGH (ref 4.0–10.5)

## 2016-04-12 LAB — COMPREHENSIVE METABOLIC PANEL
ALT: 33 U/L (ref 14–54)
ALT: 28 U/L (ref 14–54)
AST: 41 U/L (ref 15–41)
AST: 34 U/L (ref 15–41)
Albumin: 3.6 g/dL (ref 3.5–5.0)
Albumin: 3 g/dL — ABNORMAL LOW (ref 3.5–5.0)
Alkaline Phosphatase: 64 U/L (ref 38–126)
Alkaline Phosphatase: 53 U/L (ref 38–126)
Anion gap: 15 (ref 5–15)
Anion gap: 12 (ref 5–15)
BUN: 33 mg/dL — ABNORMAL HIGH (ref 6–20)
BUN: 33 mg/dL — ABNORMAL HIGH (ref 6–20)
CO2: 24 mmol/L (ref 22–32)
CO2: 22 mmol/L (ref 22–32)
Calcium: 8.4 mg/dL — ABNORMAL LOW (ref 8.9–10.3)
Calcium: 7.3 mg/dL — ABNORMAL LOW (ref 8.9–10.3)
Chloride: 99 mmol/L — ABNORMAL LOW (ref 101–111)
Chloride: 105 mmol/L (ref 101–111)
Creatinine, Ser: 6.68 mg/dL — ABNORMAL HIGH (ref 0.44–1.00)
Creatinine, Ser: 6.28 mg/dL — ABNORMAL HIGH (ref 0.44–1.00)
GFR calc Af Amer: 6 mL/min — ABNORMAL LOW (ref >60)
GFR calc Af Amer: 7 mL/min — ABNORMAL LOW (ref >60)
GFR calc non Af Amer: 5 mL/min — ABNORMAL LOW (ref >60)
GFR calc non Af Amer: 6 mL/min — ABNORMAL LOW (ref >60)
Glucose, Bld: 150 mg/dL — ABNORMAL HIGH (ref 65–99)
Glucose, Bld: 145 mg/dL — ABNORMAL HIGH (ref 65–99)
Potassium: 3.6 mmol/L (ref 3.5–5.1)
Potassium: 3.7 mmol/L (ref 3.5–5.1)
Sodium: 138 mmol/L (ref 135–145)
Sodium: 139 mmol/L (ref 135–145)
Total Bilirubin: 0.7 mg/dL (ref 0.3–1.2)
Total Bilirubin: 0.6 mg/dL (ref 0.3–1.2)
Total Protein: 6.7 g/dL (ref 6.5–8.1)
Total Protein: 5.7 g/dL — ABNORMAL LOW (ref 6.5–8.1)

## 2016-04-12 LAB — I-STAT CG4 LACTIC ACID, ED
Lactic Acid, Venous: 2.74 mmol/L (ref 0.5–1.9)
Lactic Acid, Venous: 1.15 mmol/L (ref 0.5–1.9)

## 2016-04-12 LAB — PROTIME-INR
INR: 5.48
INR: 6.95
Prothrombin Time: 51.5 seconds — ABNORMAL HIGH (ref 11.4–15.2)
Prothrombin Time: 62.3 seconds — ABNORMAL HIGH (ref 11.4–15.2)

## 2016-04-12 LAB — PROCALCITONIN: Procalcitonin: 0.54 ng/mL

## 2016-04-12 LAB — APTT: aPTT: 144 seconds — ABNORMAL HIGH (ref 24–36)

## 2016-04-12 LAB — LACTIC ACID, PLASMA: Lactic Acid, Venous: 2.4 mmol/L (ref 0.5–1.9)

## 2016-04-12 MED ORDER — SODIUM CHLORIDE 0.9 % IV BOLUS (SEPSIS)
1000.0000 mL | Freq: Once | INTRAVENOUS | Status: AC
  Administered 2016-04-12: 1000 mL via INTRAVENOUS

## 2016-04-12 MED ORDER — HYDROCORTISONE NA SUCCINATE PF 100 MG IJ SOLR
100.0000 mg | Freq: Once | INTRAMUSCULAR | Status: AC
  Administered 2016-04-12: 100 mg via INTRAVENOUS
  Filled 2016-04-12: qty 2

## 2016-04-12 MED ORDER — SENNOSIDES-DOCUSATE SODIUM 8.6-50 MG PO TABS
1.0000 | ORAL_TABLET | Freq: Every evening | ORAL | Status: DC | PRN
  Filled 2016-04-12: qty 1

## 2016-04-12 MED ORDER — DEXTROSE 5 % IV SOLN
500.0000 mg | Freq: Two times a day (BID) | INTRAVENOUS | Status: DC
  Administered 2016-04-13 – 2016-04-14 (×3): 500 mg via INTRAVENOUS
  Filled 2016-04-12 (×5): qty 0.5

## 2016-04-12 MED ORDER — GABAPENTIN 100 MG PO CAPS
100.0000 mg | ORAL_CAPSULE | Freq: Every day | ORAL | Status: DC
  Administered 2016-04-12 – 2016-04-13 (×2): 100 mg via ORAL
  Filled 2016-04-12 (×2): qty 1

## 2016-04-12 MED ORDER — SODIUM CHLORIDE 0.9 % IV BOLUS (SEPSIS)
250.0000 mL | Freq: Once | INTRAVENOUS | Status: AC
  Administered 2016-04-12: 250 mL via INTRAVENOUS

## 2016-04-12 MED ORDER — HYDROCORTISONE NA SUCCINATE PF 100 MG IJ SOLR
50.0000 mg | Freq: Four times a day (QID) | INTRAMUSCULAR | Status: DC
  Administered 2016-04-13 (×2): 50 mg via INTRAVENOUS
  Filled 2016-04-12 (×2): qty 2

## 2016-04-12 MED ORDER — HYDROCODONE-ACETAMINOPHEN 5-325 MG PO TABS: 1.0000 | ORAL_TABLET | Freq: Four times a day (QID) | ORAL | Status: DC | PRN

## 2016-04-12 MED ORDER — INSULIN ASPART 100 UNIT/ML ~~LOC~~ SOLN
0.0000 [IU] | Freq: Three times a day (TID) | SUBCUTANEOUS | Status: DC
  Administered 2016-04-13: 1 [IU] via SUBCUTANEOUS

## 2016-04-12 MED ORDER — BISACODYL 10 MG RE SUPP: 10.0000 mg | Freq: Every day | RECTAL | Status: DC | PRN

## 2016-04-12 MED ORDER — COLCHICINE 0.6 MG PO TABS
0.6000 mg | ORAL_TABLET | Freq: Every day | ORAL | Status: DC
  Administered 2016-04-12 – 2016-04-13 (×2): 0.6 mg via ORAL
  Filled 2016-04-12 (×2): qty 1

## 2016-04-12 MED ORDER — VANCOMYCIN HCL IN DEXTROSE 1-5 GM/200ML-% IV SOLN: 1000.0000 mg | Freq: Once | INTRAVENOUS | Status: DC

## 2016-04-12 MED ORDER — HYDROCORTISONE NA SUCCINATE PF 100 MG IJ SOLR
50.0000 mg | Freq: Four times a day (QID) | INTRAMUSCULAR | Status: DC
  Filled 2016-04-12: qty 2

## 2016-04-12 MED ORDER — DEXTROSE 5 % IV SOLN: 500.0000 mg | Freq: Two times a day (BID) | INTRAVENOUS | Status: DC

## 2016-04-12 MED ORDER — ALBUTEROL SULFATE HFA 108 (90 BASE) MCG/ACT IN AERS: 2.0000 | INHALATION_SPRAY | Freq: Four times a day (QID) | RESPIRATORY_TRACT | Status: DC | PRN

## 2016-04-12 MED ORDER — LEVOTHYROXINE SODIUM 100 MCG PO TABS
100.0000 ug | ORAL_TABLET | Freq: Every day | ORAL | Status: DC
  Administered 2016-04-13 – 2016-04-14 (×2): 100 ug via ORAL
  Filled 2016-04-12 (×3): qty 1

## 2016-04-12 MED ORDER — MOMETASONE FURO-FORMOTEROL FUM 100-5 MCG/ACT IN AERO
2.0000 | INHALATION_SPRAY | Freq: Two times a day (BID) | RESPIRATORY_TRACT | Status: DC
  Administered 2016-04-12 – 2016-04-14 (×4): 2 via RESPIRATORY_TRACT
  Filled 2016-04-12: qty 8.8

## 2016-04-12 MED ORDER — MIDODRINE HCL 5 MG PO TABS
10.0000 mg | ORAL_TABLET | Freq: Three times a day (TID) | ORAL | Status: DC
  Administered 2016-04-12 – 2016-04-14 (×5): 10 mg via ORAL
  Filled 2016-04-12 (×6): qty 2

## 2016-04-12 MED ORDER — CINACALCET HCL 30 MG PO TABS
30.0000 mg | ORAL_TABLET | ORAL | Status: DC
  Administered 2016-04-13: 30 mg via ORAL
  Filled 2016-04-12 (×2): qty 1

## 2016-04-12 MED ORDER — FERRIC CITRATE 1 GM 210 MG(FE) PO TABS
210.0000 mg | ORAL_TABLET | ORAL | Status: DC
  Administered 2016-04-12: 210 mg via ORAL

## 2016-04-12 MED ORDER — SODIUM CHLORIDE 0.9 % IV BOLUS (SEPSIS): 500.0000 mL | Freq: Once | INTRAVENOUS | Status: DC

## 2016-04-12 MED ORDER — DOXERCALCIFEROL 0.5 MCG PO CAPS
0.5000 ug | ORAL_CAPSULE | ORAL | Status: DC
  Filled 2016-04-12: qty 1

## 2016-04-12 MED ORDER — EZETIMIBE 10 MG PO TABS
10.0000 mg | ORAL_TABLET | Freq: Every day | ORAL | Status: DC
  Administered 2016-04-12 – 2016-04-14 (×3): 10 mg via ORAL
  Filled 2016-04-12 (×4): qty 1

## 2016-04-12 MED ORDER — DEXTROSE 5 % IV SOLN
2.0000 g | Freq: Once | INTRAVENOUS | Status: AC
  Administered 2016-04-12: 2 g via INTRAVENOUS
  Filled 2016-04-12: qty 2

## 2016-04-12 MED ORDER — PANTOPRAZOLE SODIUM 40 MG PO TBEC
40.0000 mg | DELAYED_RELEASE_TABLET | Freq: Every day | ORAL | Status: DC
  Administered 2016-04-13 – 2016-04-14 (×2): 40 mg via ORAL
  Filled 2016-04-12 (×2): qty 1

## 2016-04-12 MED ORDER — ONDANSETRON HCL 4 MG/2ML IJ SOLN: 4.0000 mg | Freq: Four times a day (QID) | INTRAMUSCULAR | Status: DC | PRN

## 2016-04-12 MED ORDER — ONDANSETRON HCL 4 MG PO TABS: 4.0000 mg | ORAL_TABLET | Freq: Four times a day (QID) | ORAL | Status: DC | PRN

## 2016-04-12 MED ORDER — VANCOMYCIN HCL IN DEXTROSE 1-5 GM/200ML-% IV SOLN
1000.0000 mg | INTRAVENOUS | Status: DC
  Administered 2016-04-13: 1000 mg via INTRAVENOUS
  Filled 2016-04-12: qty 200

## 2016-04-12 MED ORDER — VANCOMYCIN HCL 10 G IV SOLR
1750.0000 mg | Freq: Once | INTRAVENOUS | Status: AC
  Administered 2016-04-12: 1750 mg via INTRAVENOUS
  Filled 2016-04-12: qty 1750

## 2016-04-12 MED ORDER — TRAZODONE HCL 50 MG PO TABS: 25.0000 mg | ORAL_TABLET | Freq: Every evening | ORAL | Status: DC | PRN

## 2016-04-12 MED ORDER — POLYETHYLENE GLYCOL 3350 17 G PO PACK
17.0000 g | PACK | Freq: Every day | ORAL | Status: DC
  Filled 2016-04-12: qty 1

## 2016-04-12 MED ORDER — VANCOMYCIN HCL IN DEXTROSE 1-5 GM/200ML-% IV SOLN: 1000.0000 mg | INTRAVENOUS | Status: DC

## 2016-04-12 MED ORDER — MAGNESIUM CITRATE PO SOLN: 1.0000 | Freq: Once | ORAL | Status: DC | PRN

## 2016-04-12 NOTE — ED Notes (Signed)
Placed patient into a gown and on the monitor did ekg shown to er doctor

## 2016-04-12 NOTE — Progress Notes (Signed)
ANTICOAGULATION CONSULT NOTE - Initial Consult  Pharmacy Consult for warfarin Indication: atrial fibrillation  Allergies  Allergen Reactions  . Amoxicillin Rash    Has patient had a PCN reaction causing immediate rash, facial/tongue/throat swelling, SOB or lightheadedness with hypotension:YES Has patient had a PCN reaction causing severe rash involving mucus membranes or skin necrosis: Yes Has patient had a PCN reaction that required hospitalization No Has patient had a PCN reaction occurring within the last 10 years: Yes If all of the above answers are "NO", then may proceed with Cephalosporin use.   . Sulfa Drugs Cross Reactors Hives and Itching  . Percocet [Oxycodone-Acetaminophen] Itching and Rash    Did not happen last time she took it  . Sulfa Antibiotics Hives    Patient Measurements: Height: _0  (165.1 cm) Weight: 193 lb (87.5 kg) IBW/kg (Calculated) : 57  Vital Signs: Temp: 100 F (37.8 C) (01/09 1441) Temp Source: Oral (01/09 1441) BP: 82/56 (01/09 1600) Pulse Rate: 94 (01/09 1600)  Labs:  Recent Labs  04/12/16 1454  HGB 11.7*  HCT 36.2  PLT 226  LABPROT 51.5*  INR 5.48*  CREATININE 6.68*      Medical History: Past Medical History:  Diagnosis Date  . A-fib ([place])   . Anemia   . Arthritis   . Diabetes mellitus    "diet controlled"  . Diabetes mellitus without complication ([place])   . ESRD (end stage renal disease) ([place])    dialysis MWF  . GERD (gastroesophageal reflux disease)   . Gout   . History of blood transfusion   . HLD (hyperlipidemia)   . HOH (hard of hearing)    left ear  . Hypertension    hypotensive -since starting dialysis  . Hypothyroidism   . PAF (paroxysmal atrial fibrillation) ([place])    a. Echo 11/16:  Mild LVH, EF 55-60%, normal wall motion, MAC, mild MR, severe LAE (49 ml/m2), mild RVE, normal RVSF, mild RAE, mild TR, PASP 24 mmHg;  CHADS2-VASc: 4 >> Coumadin followed by PCP  . Renal disorder      Assessment: Admitted  with sepsis. On warfarin prior to admission for AFib. Current INR 5.48. PTA warfarin dose: 5 mg q Wed, 7.5 mg all other days.  Goal of Therapy:  INR 2-3 Monitor platelets by anticoagulation protocol: Yes    Plan:  -Hold warfarin -Daily INR   Harvel Quale 04/12/2016,5:25 PM

## 2016-04-12 NOTE — Progress Notes (Signed)
Pharmacy Antibiotic Note  [name] is a 76 y.o. female admitted on 04/12/2016 with congestion, cough, SOB and fever. She is ESRD on HD-MWF. WBC 12.8, LA 2.7, Tm 102. Starting empiric antibiotics for sepsis.   Plan: -Aztreonam 2 g IV x1 then 500 mg IV q12h -Vancomycin 1750 mg IV x1 then 1 g IV qHD-MWF -Monitor HD schedule and tolerance, cultures, VR as needed   Height: 5\' 5"  (165.1 cm) Weight: 193 lb (87.5 kg) IBW/kg (Calculated) : 57  Temp (24hrs), Avg:100 F (37.8 C), Min:100 F (37.8 C), Max:100 F (37.8 C)   Recent Labs Lab 04/12/16 1454 04/12/16 1511  WBC 12.8*  --   LATICACIDVEN  --  2.74*    CrCl cannot be calculated (Patient's most recent lab result is older than the maximum 21 days allowed.).    Allergies  Allergen Reactions  . Amoxicillin Rash    Has patient had a PCN reaction causing immediate rash, facial/tongue/throat swelling, SOB or lightheadedness with hypotension:YES Has patient had a PCN reaction causing severe rash involving mucus membranes or skin necrosis: Yes Has patient had a PCN reaction that required hospitalization No Has patient had a PCN reaction occurring within the last 10 years: Yes If all of the above answers are "NO", then may proceed with Cephalosporin use.   . Sulfa Drugs Cross Reactors Hives and Itching  . Percocet [Oxycodone-Acetaminophen] Itching and Rash    Did not happen last time she took it  . Sulfa Antibiotics Hives    Antimicrobials this admission: 1/9 vancomycin > 1/9 aztreonam >  Dose adjustments this admission: N/A  Microbiology results: 1/9 blood cx: 1/19 urine cx:  Thank you for allowing pharmacy to be a part of this patient's care.  Harvel Quale 04/12/2016 3:30 PM

## 2016-04-12 NOTE — ED Notes (Signed)
Paged code sepsis

## 2016-04-12 NOTE — ED Triage Notes (Signed)
Pt from PCP office via [facility] with c/o chest congestion, productive yellow cough, SOB, and fever x 3 days.  Pt SpO2 was 70%s at PCP office with a fever of 102.  Given 5 mg albuterol x 2, 125 mg solumedrol, 150 mL NS, and 1000 mg tylenol with decrease in fever to 100.3 by arrival to ED per EMS.  Reports expiratory wheezing.  Hx a-fib and DM.  Pt on 4 L nasal cannula with SpO2 90%.  Dialysis Mon, Wed, Fri with last full dialysis yesterday.  Flu swab neg per PCP.  NAD, A&O.

## 2016-04-12 NOTE — ED Provider Notes (Signed)
[facility] DEPT Provider Note   CSN: [id number] Arrival date & time: 04/12/16  1436     History   Chief Complaint Chief Complaint  Patient presents with  . Cough  . Respiratory Distress    HPI [name] is a 76 y.o. female.  Patient is a 76 year old female with extensive past medical history including atrial fibrillation for which she is on Coumadin, diabetes, end-stage renal disease for which she is on hemodialysis, and hypertension. She was sent from the doctor's office for evaluation of chest congestion, cough, shortness of breath, and fever. This all began this morning. She was dialyzed yesterday and felt well. She denies any chest pain, abdominal pain, vomiting, or diarrhea.   The history is provided by the patient.  Cough  This is a new problem. Episode onset: This morning. The problem occurs constantly. The problem has been rapidly worsening. The cough is productive of sputum. The maximum temperature recorded prior to her arrival was 102 to 102.9 F. Associated symptoms include chills, shortness of breath and wheezing. She has tried nothing for the symptoms.    Past Medical History:  Diagnosis Date  . A-fib ([place])   . Anemia   . Arthritis   . Diabetes mellitus    "diet controlled"  . Diabetes mellitus without complication ([place])   . ESRD (end stage renal disease) ([place])    dialysis MWF  . GERD (gastroesophageal reflux disease)   . Gout   . History of blood transfusion   . HLD (hyperlipidemia)   . HOH (hard of hearing)    left ear  . Hypertension    hypotensive -since starting dialysis  . Hypothyroidism   . PAF (paroxysmal atrial fibrillation) ([place])    a. Echo 11/16:  Mild LVH, EF 55-60%, normal wall motion, MAC, mild MR, severe LAE (49 ml/m2), mild RVE, normal RVSF, mild RAE, mild TR, PASP 24 mmHg;  CHADS2-VASc: 4 >> Coumadin followed by PCP  . Renal disorder     Patient Active Problem List   Diagnosis Date Noted  . S/P shoulder replacement, left  01/22/2016  . Cough variant asthma vs UACS  09/01/2015  . Dyslipidemia   . Anemia of renal disease   . New onset a-fib ([place]) 02/04/2015  . Diabetes mellitus type 2, diet-controlled ([place])   . Hypothyroidism   . High cholesterol   . Steal syndrome dialysis vascular access ([place]) 12/03/2014  . Physical deconditioning 08/04/2013  . Acute on chronic renal failure ([place]) 08/04/2013  . Hyperkalemia 07/26/2013  . CKD (chronic kidney disease) 07/26/2013  . End stage renal disease ([place]) 03/15/2012    Past Surgical History:  Procedure Laterality Date  . AV FISTULA PLACEMENT  04/03/2012   Procedure: ARTERIOVENOUS (AV) FISTULA CREATION;  Surgeon: Elam Dutch, MD;  Location: [facility];  Service: Vascular;  Laterality: Left;  creation left brachial cephalic fistula   . CARPAL TUNNEL RELEASE Left   . COLONOSCOPY W/ POLYPECTOMY    . DILATION AND CURETTAGE OF UTERUS    . EYE SURGERY Bilateral    bilateral cataract removal  . Hemodialysis  catheter Right   . JOINT REPLACEMENT Bilateral    bilateral knee  . JOINT REPLACEMENT Right    shoulder  . LIGATION OF ARTERIOVENOUS  FISTULA Left 02/04/2015   Procedure: LIGATION OF BRACHIOCEPHALIC ARTERIOVENOUS  FISTULA;  Surgeon: Conrad Pajaro Dunes, MD;  Location: [facility];  Service: Vascular;  Laterality: Left;  . PORTACATH PLACEMENT    . REVERSE SHOULDER ARTHROPLASTY Left 01/22/2016  Procedure: LEFT REVERSE SHOULDER ARTHROPLASTY;  Surgeon: Netta Cedars, MD;  Location: [facility];  Service: Orthopedics;  Laterality: Left;  . SPLIT NIGHT STUDY  07/26/2015  . STERIOD INJECTION Right 01/22/2016   Procedure: RIGHT RING FINGER STEROID INJECTION;  Surgeon: Netta Cedars, MD;  Location: [facility];  Service: Orthopedics;  Laterality: Right;  . THROMBECTOMY BRACHIAL ARTERY Left 02/06/2015   Procedure: EVACUATION OF LEFT ARM HEMATOMA;  Surgeon: Angelia Mould, MD;  Location: [facility];  Service: Vascular;  Laterality: Left;  . TOTAL KNEE ARTHROPLASTY     right knee  . TUBAL LIGATION       OB History    Gravida Para Term Preterm AB Living   0 0 0 0 0     SAB TAB Ectopic Multiple Live Births   0 0 0           Home Medications    Prior to Admission medications   Medication Sig Start Date End Date Taking? Authorizing Provider  ACCU-CHEK AVIVA PLUS test strip  11/11/14   Historical Provider, MD  albuterol (PROVENTIL HFA;VENTOLIN HFA) 108 (90 Base) MCG/ACT inhaler Inhale 2 puffs into the lungs every 6 (six) hours as needed for wheezing or shortness of breath.    Historical Provider, MD  budesonide-formoterol (SYMBICORT) 80-4.5 MCG/ACT inhaler Take 2 puffs first thing in am and then another 2 puffs about 12 hours later.   as needed 12/17/15   Tanda Rockers, MD  cinacalcet (SENSIPAR) 30 MG tablet Take 30 mg by mouth every other day.    Historical Provider, MD  colchicine 0.6 MG tablet TAKE ONE TABLET BY MOUTH EVERY DAY 09/25/14   Historical Provider, MD  Darbepoetin Alfa (ARANESP) 60 MCG/0.3ML SOSY injection Inject 0.3 mLs (60 mcg total) into the vein every Thursday with hemodialysis. Patient taking differently: Inject 60 mcg into the vein every Wednesday with hemodialysis.  02/09/15   Barton Dubois, MD  doxercalciferol (HECTOROL) 0.5 MCG capsule Take 0.5 mcg by mouth every Monday, Wednesday, and Friday with hemodialysis. Every Mon / Wed / Fri  w/dialysis    Historical Provider, MD  ezetimibe (ZETIA) 10 MG tablet Take 10 mg by mouth daily.     Historical Provider, MD  famotidine (PEPCID) 20 MG tablet Take 1 tablet (20 mg total) by mouth at bedtime. One at bedtime 03/04/16   Tanda Rockers, MD  Ferric Citrate (AURYXIA) 1 GM 210 MG(Fe) TABS Take 2 tablets by mouth 3 (three) times daily before meals.    Historical Provider, MD  Ferric Citrate (AURYXIA) 1 GM 210 MG(Fe) TABS Take 1 tablet by mouth with snacks.    Historical Provider, MD  gabapentin (NEURONTIN) 100 MG capsule Take 100 mg by mouth at bedtime.  12/22/14 01/11/17  Historical Provider, MD  HYDROcodone-acetaminophen (NORCO)  5-325 MG tablet Take 1 tablet by mouth every 6 (six) hours as needed for moderate pain. 01/22/16   Netta Cedars, MD  levothyroxine (SYNTHROID, LEVOTHROID) 100 MCG tablet Take 100 mcg by mouth daily.      Historical Provider, MD  midodrine (PROAMATINE) 10 MG tablet Take 10 mg by mouth 3 (three) times daily.     Historical Provider, MD  multivitamin (RENA-VIT) TABS tablet Take 1 tablet by mouth daily.    Historical Provider, MD  pantoprazole (PROTONIX) 40 MG tablet Take 1 tablet (40 mg total) by mouth daily. Take 30-60 min before first meal of the day 12/17/15   Tanda Rockers, MD  polyethylene glycol [facility] / Floria Raveling) packet  Take 17 g by mouth daily. 02/09/15   Barton Dubois, MD  Vitamin D, Ergocalciferol, (DRISDOL) 50000 UNITS CAPS Take 50,000 Units by mouth every Monday.     Historical Provider, MD  warfarin (COUMADIN) 5 MG tablet Take 7.22m daily on M-W-F abd then 5 mg daily the rest of the week Patient taking differently: Take 5-7.5 mg by mouth as directed. Take 1 tablet (5 MG) on Wednesday, but take 1.5 tablets (7.5 MG) on all other days. 02/09/15   CBarton Dubois MD    Family History Family History  Problem Relation Age of Onset  . Diabetes Father     before age 863 . Heart disease Father   . Diabetes Sister   . Cancer Brother   . Hyperlipidemia Daughter   . Hypertension Daughter   . Hypertension Son   . Lung cancer Sister   . Cancer Sister     Social History Social History  Substance Use Topics  . Smoking status: Former Smoker    Packs/day: 0.25    Years: 2.00    Types: Cigarettes    Quit date: 04/04/1998  . Smokeless tobacco: Never Used  . Alcohol use No     Allergies   Amoxicillin; Sulfa drugs cross reactors; Percocet [oxycodone-acetaminophen]; and Sulfa antibiotics   Review of Systems Review of Systems  Constitutional: Positive for chills.  Respiratory: Positive for cough, shortness of breath and wheezing.   All other systems reviewed and are  negative.    Physical Exam Updated Vital Signs BP (!) 79/55   Pulse 81   Temp 100 F (37.8 C) (Oral)   Resp 19   Ht _0  (1.651 m)   Wt 193 lb (87.5 kg)   SpO2 92%   BMI 32.12 kg/m   Physical Exam  Constitutional: She is oriented to person, place, and time. She appears well-developed and well-nourished. No distress.  HENT:  Head: Normocephalic and atraumatic.  Mouth/Throat: Oropharynx is clear and moist.  Neck: Normal range of motion. Neck supple.  Cardiovascular: Normal rate and regular rhythm.  Exam reveals no gallop and no friction rub.   No murmur heard. Pulmonary/Chest: Effort normal. No respiratory distress. She has wheezes. She has rales.  Lungs are rhonchorous throughout with rales in the bases.  Abdominal: Soft. Bowel sounds are normal. She exhibits no distension. There is no tenderness.  Musculoskeletal: Normal range of motion. She exhibits no edema.  Neurological: She is alert and oriented to person, place, and time.  Skin: Skin is warm and dry. She is not diaphoretic.  Nursing note and vitals reviewed.    ED Treatments / Results  Labs (all labs ordered are listed, but only abnormal results are displayed) Labs Reviewed  CULTURE, BLOOD (ROUTINE X 2)  CULTURE, BLOOD (ROUTINE X 2)  URINE CULTURE  COMPREHENSIVE METABOLIC PANEL  CBC WITH DIFFERENTIAL/PLATELET  PROTIME-INR  URINALYSIS, ROUTINE W REFLEX MICROSCOPIC  I-STAT CG4 LACTIC ACID, ED    EKG  EKG Interpretation  Date/Time:  Tuesday April 12 2016 14:40:32 EST Ventricular Rate:  110 PR Interval:    QRS Duration: 90 QT Interval:  373 QTC Calculation: 505 R Axis:   -32 Text Interpretation:  Atrial fibrillation LVH with secondary repolarization abnormality Prolonged QT interval Confirmed by DStark Jock MD, Clay Solum ([id number] on 04/12/2016 3:26:22 PM       Radiology No results found.  Procedures Procedures (including critical care time)  Medications Ordered in ED Medications - No data to  display   Initial Impression /  Assessment and Plan / ED Course  I have reviewed the triage vital signs and the nursing notes.  Pertinent labs & imaging results that were available during my care of the patient were reviewed by me and considered in my medical decision making (see chart for details).  Clinical Course     Patient sent from the doctor's office for evaluation of fever, shortness of breath, and congestion. Her x-ray does not show an infiltrate, however her lungs are rhonchorous. I suspect a pulmonary etiology, however he does have an indwelling dialysis catheter. She was treated with appropriate antibiotics, fluids per sepsis protocol, and will be admitted to the hospitalist service under the care of Dr. Marily Memos.  CRITICAL CARE Performed by: Veryl Speak Total critical care time: 30 minutes Critical care time was exclusive of separately billable procedures and treating other patients. Critical care was necessary to treat or prevent imminent or life-threatening deterioration. Critical care was time spent personally by me on the following activities: development of treatment plan with patient and/or surrogate as well as nursing, discussions with consultants, evaluation of patient's response to treatment, examination of patient, obtaining history from patient or surrogate, ordering and performing treatments and interventions, ordering and review of laboratory studies, ordering and review of radiographic studies, pulse oximetry and re-evaluation of patient's condition.   Final Clinical Impressions(s) / ED Diagnoses   Final diagnoses:  None    New Prescriptions New Prescriptions   No medications on file     Veryl Speak, MD 04/12/16 [phone]

## 2016-04-12 NOTE — Consult Note (Signed)
Renal Service Consult Note [facility] 04/12/2016 Sol [place]azing Requesting Physician:  Dr Marily Memos  Reason for Consult:  ESRD pt with hypotension, cough and hypoxemia HPI: The patient is a 76 y.o. year-old with history of HL, HTN, gout, NIDDM, atrial fibrillation, low T4 and ESRD on HD MWF at [facility].  Patient presented to PCP today for eval of history of productive cough, congestion, SOB and fevers for 1-2 days.  In the office her temp was 102 deg , low O2 sat in 70% range, and low BP's .  Pt was given nebs, steroids IV and sent to ED.  On arrival had exp wheezing and CXR w/o gross infiltrate, no old film.  Being admitted for sepsis/ hypotension.  Asked to see for ESRD.    History is mostly per daughter-in-law.  Pt lives with her DIL.  Per the DIL , pt is DNR.  She has been on HD since Aug 2016.  Had steal syndrome in L arm and then the other veins weren't "big enough" for an access so she has been catheter-dependent.  No etoh/ tob.  No n/v/d, no abd pain or other issues.  No chills or rigors.  No probs with HD yesterday.  Takes midodrine for low BP"s on HD.     ROS  denies CP  no joint pain   no HA  no blurry vision  no rash  no diarrhea  no nausea/ vomiting   Past Medical History  Past Medical History:  Diagnosis Date  . A-fib ([place])   . Anemia   . Arthritis   . Diabetes mellitus    "diet controlled"  . Diabetes mellitus without complication ([place])   . ESRD (end stage renal disease) ([place])    dialysis MWF  . GERD (gastroesophageal reflux disease)   . Gout   . History of blood transfusion   . HLD (hyperlipidemia)   . HOH (hard of hearing)    left ear  . Hypertension    hypotensive -since starting dialysis  . Hypothyroidism   . PAF (paroxysmal atrial fibrillation) ([place])    a. Echo 11/16:  Mild LVH, EF 55-60%, normal wall motion, MAC, mild MR, severe LAE (49 ml/m2), mild RVE, normal RVSF, mild RAE, mild TR, PASP 24 mmHg;  CHADS2-VASc: 4 >>  Coumadin followed by PCP  . Renal disorder    Past Surgical History  Past Surgical History:  Procedure Laterality Date  . AV FISTULA PLACEMENT  04/03/2012   Procedure: ARTERIOVENOUS (AV) FISTULA CREATION;  Surgeon: Elam Dutch, MD;  Location: [facility];  Service: Vascular;  Laterality: Left;  creation left brachial cephalic fistula   . CARPAL TUNNEL RELEASE Left   . COLONOSCOPY W/ POLYPECTOMY    . DILATION AND CURETTAGE OF UTERUS    . EYE SURGERY Bilateral    bilateral cataract removal  . Hemodialysis  catheter Right   . JOINT REPLACEMENT Bilateral    bilateral knee  . JOINT REPLACEMENT Right    shoulder  . LIGATION OF ARTERIOVENOUS  FISTULA Left 02/04/2015   Procedure: LIGATION OF BRACHIOCEPHALIC ARTERIOVENOUS  FISTULA;  Surgeon: Conrad Springerton, MD;  Location: [place];  Service: Vascular;  Laterality: Left;  . PORTACATH PLACEMENT    . REVERSE SHOULDER ARTHROPLASTY Left 01/22/2016   Procedure: LEFT REVERSE SHOULDER ARTHROPLASTY;  Surgeon: Netta Cedars, MD;  Location: [place];  Service: Orthopedics;  Laterality: Left;  . SPLIT NIGHT STUDY  07/26/2015  . STERIOD INJECTION Right 01/22/2016  Procedure: RIGHT RING FINGER STEROID INJECTION;  Surgeon: Netta Cedars, MD;  Location: [place];  Service: Orthopedics;  Laterality: Right;  . THROMBECTOMY BRACHIAL ARTERY Left 02/06/2015   Procedure: EVACUATION OF LEFT ARM HEMATOMA;  Surgeon: Angelia Mould, MD;  Location: [facility];  Service: Vascular;  Laterality: Left;  . TOTAL KNEE ARTHROPLASTY     right knee  . TUBAL LIGATION     Family History  Family History  Problem Relation Age of Onset  . Diabetes Father     before age 34  . Heart disease Father   . Diabetes Sister   . Cancer Brother   . Hyperlipidemia Daughter   . Hypertension Daughter   . Hypertension Son   . Lung cancer Sister   . Cancer Sister    Social History  reports that she quit smoking about 18 years ago. Her smoking use included Cigarettes. She has a 0.50 pack-year smoking  history. She has never used smokeless tobacco. She reports that she does not drink alcohol or use drugs. Allergies  Allergies  Allergen Reactions  . Amoxicillin Rash    Has patient had a PCN reaction causing immediate rash, facial/tongue/throat swelling, SOB or lightheadedness with hypotension:YES Has patient had a PCN reaction causing severe rash involving mucus membranes or skin necrosis: Yes Has patient had a PCN reaction that required hospitalization No Has patient had a PCN reaction occurring within the last 10 years: Yes If all of the above answers are "NO", then may proceed with Cephalosporin use.   . Sulfa Drugs Cross Reactors Hives and Itching  . Percocet [Oxycodone-Acetaminophen] Itching and Rash    Did not happen last time she took it  . Sulfa Antibiotics Hives   Home medications Prior to Admission medications   Medication Sig Start Date End Date Taking? Authorizing Provider  albuterol (PROVENTIL HFA;VENTOLIN HFA) 108 (90 Base) MCG/ACT inhaler Inhale 2 puffs into the lungs every 6 (six) hours as needed for wheezing or shortness of breath.   Yes Historical Provider, MD  budesonide-formoterol (SYMBICORT) 80-4.5 MCG/ACT inhaler Take 2 puffs first thing in am and then another 2 puffs about 12 hours later.   as needed 12/17/15  Yes Tanda Rockers, MD  cinacalcet (SENSIPAR) 30 MG tablet Take 30 mg by mouth every other day.   Yes Historical Provider, MD  colchicine 0.6 MG tablet TAKE ONE TABLET BY MOUTH EVERY DAY 09/25/14  Yes Historical Provider, MD  Darbepoetin Alfa (ARANESP) 60 MCG/0.3ML SOSY injection Inject 0.3 mLs (60 mcg total) into the vein every Thursday with hemodialysis. Patient taking differently: Inject 60 mcg into the vein every Wednesday with hemodialysis.  02/09/15  Yes Barton Dubois, MD  doxercalciferol (HECTOROL) 0.5 MCG capsule Take 0.5 mcg by mouth every Monday, Wednesday, and Friday with hemodialysis. Every Mon / Wed / Fri  w/dialysis   Yes Historical Provider, MD   ezetimibe (ZETIA) 10 MG tablet Take 10 mg by mouth daily.    Yes Historical Provider, MD  Ferric Citrate (AURYXIA) 1 GM 210 MG(Fe) TABS Take 2 tablets by mouth 3 (three) times daily before meals.   Yes Historical Provider, MD  Ferric Citrate (AURYXIA) 1 GM 210 MG(Fe) TABS Take 1 tablet by mouth with snacks.   Yes Historical Provider, MD  gabapentin (NEURONTIN) 100 MG capsule Take 100 mg by mouth at bedtime.  12/22/14 01/11/17 Yes Historical Provider, MD  HYDROcodone-acetaminophen (NORCO) 5-325 MG tablet Take 1 tablet by mouth every 6 (six) hours as needed for moderate pain. 01/22/16  Yes Netta Cedars, MD  levothyroxine (SYNTHROID, LEVOTHROID) 100 MCG tablet Take 100 mcg by mouth daily.     Yes Historical Provider, MD  midodrine (PROAMATINE) 10 MG tablet Take 10 mg by mouth 3 (three) times daily.    Yes Historical Provider, MD  multivitamin (RENA-VIT) TABS tablet Take 1 tablet by mouth daily.   Yes Historical Provider, MD  pantoprazole (PROTONIX) 40 MG tablet Take 1 tablet (40 mg total) by mouth daily. Take 30-60 min before first meal of the day 12/17/15  Yes Tanda Rockers, MD  polyethylene glycol [facility] / GLYCOLAX) packet Take 17 g by mouth daily. 02/09/15  Yes Barton Dubois, MD  Vitamin D, Ergocalciferol, (DRISDOL) 50000 UNITS CAPS Take 50,000 Units by mouth every Monday.    Yes Historical Provider, MD  warfarin (COUMADIN) 5 MG tablet Take 5 mg by mouth once a week. Take on Wednesday   Yes Historical Provider, MD  warfarin (COUMADIN) 7.5 MG tablet Take 5-7.5 mg by mouth daily. Take 7.67m tablet every day except on Wednesdays take 554m  Yes Historical Provider, MD  ACCU-CHEK AVIVA PLUS test strip  11/11/14   Historical Provider, MD  famotidine (PEPCID) 20 MG tablet Take 1 tablet (20 mg total) by mouth at bedtime. One at bedtime Patient not taking: Reported on 04/12/2016 03/04/16   MiTanda RockersMD  warfarin (COUMADIN) 5 MG tablet Take 7.62m73maily on M-W-F abd then 5 mg daily the rest of the  week Patient not taking: Reported on 04/12/2016 02/09/15   CarBarton DuboisD   Liver Function Tests  Recent Labs Lab 04/12/16 1454  AST 41  ALT 33  ALKPHOS 64  BILITOT 0.7  PROT 6.7  ALBUMIN 3.6   No results for input(s): LIPASE, AMYLASE in the last 168 hours. CBC  Recent Labs Lab 04/12/16 1454  WBC 12.8*  NEUTROABS 10.2*  HGB 11.7*  HCT 36.2  MCV 97.8  PLT [id number] Metabolic Panel  Recent Labs Lab 04/12/16 1454  NA 138  K 3.6  CL 99*  CO2 24  GLUCOSE 150*  BUN 33*  CREATININE 6.68*  CALCIUM 8.4*   Iron/TIBC/Ferritin/ %Sat    Component Value Date/Time   IRON 52 05/04/2015 1109   TIBC 290 11/12/2014 0914   FERRITIN 503.4 (H) 05/04/2015 1109   IRONPCTSAT 22.0 05/04/2015 1109    Vitals:   04/12/16 1500 04/12/16 1515 04/12/16 1530 04/12/16 1600  BP: (!) 79/55 (!) 79/53 (!) 77/54 (!) 82/56  Pulse: 81 103 116 94  Resp: _0 Temp:      TempSrc:      SpO2: 92% 95% 91% 92%  Weight:      Height:       Exam Gen elderly WF, raspy cough, no distress No rash, cyanosis or gangrene Sclera anicteric, throat clear  No jvd or bruits Chest diffuse coarse rhonchi and coarse exp wheezing bilat RRR no MRG Abd soft ntnd no mass or ascites +bs obese GU defer MS no joint effusions or deformity Ext no sig LE edema / no wounds or ulcers Neuro is alert, Ox 3 , nf  CXR no active disease EKG afib with VR 98, LVH  Dialysis: [facility] 4h  R IJ cath  2/2.5 bath  87kg   Hep none  - 3 Hect tiw  Assessment: 1. Fever/ hypotension - r/o sepsis.  Getting IVF"s cautiously given ESRD.  Blood cx's done.  Emp abx.  Could have cath sepsis  vs early PNA.   2. ESRD usual HD MWF 3. Volume - no excess on exam. No CHF on cxr today. 4. Chronic afib - on coumadin 5. Chronic hypotension - on midodrine 6. 2HPTH - cont meds 7. Anemia of CKD - Hb 10-11, no need esa now   Plan - IVF bolus underway, cx's, abx, steroid per primary.  HD tomorrow if stable.   Kelly Splinter  MD [employer] pager [phone]   04/12/2016, 5:05 PM

## 2016-04-12 NOTE — ED Notes (Signed)
Pt does not make urine; unable to obtain UA/UC.

## 2016-04-12 NOTE — H&P (Signed)
History and Physical    [name] PIR:[id number] DOB: 01/11/1941 DOA: 04/12/2016   PCP: Sherrie Mustache, MD   Patient coming from:  Home   Chief Complaint: shortness of breath and cough   HPI: [name] is a 76 y.o. female with medical history significant for extensive medical history listed below, including ESRD on HD MWF, last 04/12/15, atrial fibrillation on chronic Coumadin, diabetes, and hypotension. Patient was sent from the doctor's office to the emergency department for evaluation of chest congestion, cough and shortness of breath, and fever up to 102 with chills.. The symptoms all began this morning. She denies any chest pain abdominal pain vomiting or diarrhea. She denies any sick contacts. She denies any recent distance trips. She did have an upper respiratory infection 3 weeks ago, treated with azithromycin. She denies any abdominal pain. No lower extremity swelling.  ED Course:  BP (!) 82/56   Pulse 94   Temp 100 F (37.8 C) (Oral)   Resp 19   Ht _0  (1.651 m)   Wt 87.5 kg (193 lb)   SpO2 92%   BMI 32.12 kg/m   EKG monitor fibrillation, QTC 505. Sodium 138 potassium 3.6 glucose 150 33 creatinine 6.68 lactic acid 2.74 CXR remarkable for bronchitic changes, but no focal acute pulmonary abnormality.  2 D echo 02/2015  mild LVH. Systolic function was normal. EF  55% to 60%.   She  received vancomycin and aztreonam , and 2 L fluid bolus.    Review of Systems: As per HPI otherwise 10 point review of systems negative.   Past Medical History:  Diagnosis Date  . A-fib ([place])   . Anemia   . Arthritis   . Diabetes mellitus    "diet controlled"  . Diabetes mellitus without complication ([place])   . ESRD (end stage renal disease) ([place])    dialysis MWF  . GERD (gastroesophageal reflux disease)   . Gout   . History of blood transfusion   . HLD (hyperlipidemia)   . HOH (hard of hearing)    left ear  . Hypertension    hypotensive -since starting dialysis  .  Hypothyroidism   . PAF (paroxysmal atrial fibrillation) ([place])    a. Echo 11/16:  Mild LVH, EF 55-60%, normal wall motion, MAC, mild MR, severe LAE (49 ml/m2), mild RVE, normal RVSF, mild RAE, mild TR, PASP 24 mmHg;  CHADS2-VASc: 4 >> Coumadin followed by PCP  . Renal disorder     Past Surgical History:  Procedure Laterality Date  . AV FISTULA PLACEMENT  04/03/2012   Procedure: ARTERIOVENOUS (AV) FISTULA CREATION;  Surgeon: Elam Dutch, MD;  Location: [facility];  Service: Vascular;  Laterality: Left;  creation left brachial cephalic fistula   . CARPAL TUNNEL RELEASE Left   . COLONOSCOPY W/ POLYPECTOMY    . DILATION AND CURETTAGE OF UTERUS    . EYE SURGERY Bilateral    bilateral cataract removal  . Hemodialysis  catheter Right   . JOINT REPLACEMENT Bilateral    bilateral knee  . JOINT REPLACEMENT Right    shoulder  . LIGATION OF ARTERIOVENOUS  FISTULA Left 02/04/2015   Procedure: LIGATION OF BRACHIOCEPHALIC ARTERIOVENOUS  FISTULA;  Surgeon: Conrad Dixon [place], MD;  Location: [facility];  Service: Vascular;  Laterality: Left;  . PORTACATH PLACEMENT    . REVERSE SHOULDER ARTHROPLASTY Left 01/22/2016   Procedure: LEFT REVERSE SHOULDER ARTHROPLASTY;  Surgeon: Netta Cedars, MD;  Location: [facility];  Service: Orthopedics;  Laterality: Left;  .  SPLIT NIGHT STUDY  07/26/2015  . STERIOD INJECTION Right 01/22/2016   Procedure: RIGHT RING FINGER STEROID INJECTION;  Surgeon: Netta Cedars, MD;  Location: [facility];  Service: Orthopedics;  Laterality: Right;  . THROMBECTOMY BRACHIAL ARTERY Left 02/06/2015   Procedure: EVACUATION OF LEFT ARM HEMATOMA;  Surgeon: Angelia Mould, MD;  Location: [facility];  Service: Vascular;  Laterality: Left;  . TOTAL KNEE ARTHROPLASTY     right knee  . TUBAL LIGATION      Social History Social History   Social History  . Marital status: Widowed    Spouse name: N/A  . Number of children: 6  . Years of education: N/A   Occupational History  . Not on file.   Social History  Main Topics  . Smoking status: Former Smoker    Packs/day: 0.25    Years: 2.00    Types: Cigarettes    Quit date: 04/04/1998  . Smokeless tobacco: Never Used  . Alcohol use No  . Drug use: No  . Sexual activity: Not on file   Other Topics Concern  . Not on file   Social History Narrative   ** Merged History Encounter **       Widowed 6 children Lives with son and daughter in law homemaker     Allergies  Allergen Reactions  . Amoxicillin Rash    Has patient had a PCN reaction causing immediate rash, facial/tongue/throat swelling, SOB or lightheadedness with hypotension:YES Has patient had a PCN reaction causing severe rash involving mucus membranes or skin necrosis: Yes Has patient had a PCN reaction that required hospitalization No Has patient had a PCN reaction occurring within the last 10 years: Yes If all of the above answers are "NO", then may proceed with Cephalosporin use.   . Sulfa Drugs Cross Reactors Hives and Itching  . Percocet [Oxycodone-Acetaminophen] Itching and Rash    Did not happen last time she took it  . Sulfa Antibiotics Hives    Family History  Problem Relation Age of Onset  . Diabetes Father     before age 20  . Heart disease Father   . Diabetes Sister   . Cancer Brother   . Hyperlipidemia Daughter   . Hypertension Daughter   . Hypertension Son   . Lung cancer Sister   . Cancer Sister       Prior to Admission medications   Medication Sig Start Date End Date Taking? Authorizing Provider  ACCU-CHEK AVIVA PLUS test strip  11/11/14   Historical Provider, MD  albuterol (PROVENTIL HFA;VENTOLIN HFA) 108 (90 Base) MCG/ACT inhaler Inhale 2 puffs into the lungs every 6 (six) hours as needed for wheezing or shortness of breath.    Historical Provider, MD  budesonide-formoterol (SYMBICORT) 80-4.5 MCG/ACT inhaler Take 2 puffs first thing in am and then another 2 puffs about 12 hours later.   as needed 12/17/15   Tanda Rockers, MD  cinacalcet (SENSIPAR)  30 MG tablet Take 30 mg by mouth every other day.    Historical Provider, MD  colchicine 0.6 MG tablet TAKE ONE TABLET BY MOUTH EVERY DAY 09/25/14   Historical Provider, MD  Darbepoetin Alfa (ARANESP) 60 MCG/0.3ML SOSY injection Inject 0.3 mLs (60 mcg total) into the vein every Thursday with hemodialysis. Patient taking differently: Inject 60 mcg into the vein every Wednesday with hemodialysis.  02/09/15   Barton Dubois, MD  doxercalciferol (HECTOROL) 0.5 MCG capsule Take 0.5 mcg by mouth every Monday, Wednesday, and Friday with hemodialysis.  Every Mon / Wed / Fri  w/dialysis    Historical Provider, MD  ezetimibe (ZETIA) 10 MG tablet Take 10 mg by mouth daily.     Historical Provider, MD  famotidine (PEPCID) 20 MG tablet Take 1 tablet (20 mg total) by mouth at bedtime. One at bedtime 03/04/16   Tanda Rockers, MD  Ferric Citrate (AURYXIA) 1 GM 210 MG(Fe) TABS Take 2 tablets by mouth 3 (three) times daily before meals.    Historical Provider, MD  Ferric Citrate (AURYXIA) 1 GM 210 MG(Fe) TABS Take 1 tablet by mouth with snacks.    Historical Provider, MD  gabapentin (NEURONTIN) 100 MG capsule Take 100 mg by mouth at bedtime.  12/22/14 01/11/17  Historical Provider, MD  HYDROcodone-acetaminophen (NORCO) 5-325 MG tablet Take 1 tablet by mouth every 6 (six) hours as needed for moderate pain. 01/22/16   Netta Cedars, MD  levothyroxine (SYNTHROID, LEVOTHROID) 100 MCG tablet Take 100 mcg by mouth daily.      Historical Provider, MD  midodrine (PROAMATINE) 10 MG tablet Take 10 mg by mouth 3 (three) times daily.     Historical Provider, MD  multivitamin (RENA-VIT) TABS tablet Take 1 tablet by mouth daily.    Historical Provider, MD  pantoprazole (PROTONIX) 40 MG tablet Take 1 tablet (40 mg total) by mouth daily. Take 30-60 min before first meal of the day 12/17/15   Tanda Rockers, MD  polyethylene glycol [facility] / GLYCOLAX) packet Take 17 g by mouth daily. 02/09/15   Barton Dubois, MD  Vitamin D, Ergocalciferol,  (DRISDOL) 50000 UNITS CAPS Take 50,000 Units by mouth every Monday.     Historical Provider, MD  warfarin (COUMADIN) 5 MG tablet Take 7.60m daily on M-W-F abd then 5 mg daily the rest of the week Patient taking differently: Take 5-7.5 mg by mouth as directed. Take 1 tablet (5 MG) on Wednesday, but take 1.5 tablets (7.5 MG) on all other days. 02/09/15   CBarton Dubois MD    Physical Exam:    Vitals:   04/12/16 1500 04/12/16 1515 04/12/16 1530 04/12/16 1600  BP: (!) 79/55 (!) 79/53 (!) 77/54 (!) 82/56  Pulse: 81 103 116 94  Resp: _0 Temp:      TempSrc:      SpO2: 92% 95% 91% 92%  Weight:      Height:           Constitutional: NAD, calm, comfortable on O2 [place] 4 l .  Vitals:   04/12/16 1500 04/12/16 1515 04/12/16 1530 04/12/16 1600  BP: (!) 79/55 (!) 79/53 (!) 77/54 (!) 82/56  Pulse: 81 103 116 94  Resp: _1 Temp:      TempSrc:      SpO2: 92% 95% 91% 92%  Weight:      Height:       Eyes: PERRL, lids and conjunctivae normal ENMT: Mucous membranes are moist. Posterior pharynx clear of any exudate or lesions edentulous  Neck: normal, supple, no masses, no thyromegaly Respiratory: bilateral rhonchi, no wheezing . Normal respiratory effort. No accessory muscle use.  Cardiovascular: Irregularly irregular tachy and rhythm, 2/6  murmurs / rubs / gallops. No extremity edema. 2+ pedal pulses. R line cath non tender to palpation  No carotid bruits.  Abdomen:  Obese no tenderness, no masses palpated. No hepatosplenomegaly. Bowel sounds positive.  Musculoskeletal: no clubbing / cyanosis. No joint deformity upper and lower extremities. Good ROM, no contractures. Normal muscle tone.  Skin: no rashes, lesions, ulcers.  Neurologic: CN 2-12 grossly intact. Sensation intact, DTR normal. Strength 5/5 in all 4.  Psychiatric: Normal judgment and insight. Alert and oriented x 3. Normal mood.     Labs on Admission: I have personally reviewed following labs and imaging  studies  CBC:  Recent Labs Lab 04/12/16 1454  WBC 12.8*  NEUTROABS 10.2*  HGB 11.7*  HCT 36.2  MCV 97.8  PLT 937    Basic Metabolic Panel:  Recent Labs Lab 04/12/16 1454  NA 138  K 3.6  CL 99*  CO2 24  GLUCOSE 150*  BUN 33*  CREATININE 6.68*  CALCIUM 8.4*    GFR: Estimated Creatinine Clearance: 7.9 mL/min (by C-G formula based on SCr of 6.68 mg/dL (H)).  Liver Function Tests:  Recent Labs Lab 04/12/16 1454  AST 41  ALT 33  ALKPHOS 64  BILITOT 0.7  PROT 6.7  ALBUMIN 3.6   No results for input(s): LIPASE, AMYLASE in the last 168 hours. No results for input(s): AMMONIA in the last 168 hours.  Coagulation Profile:  Recent Labs Lab 04/12/16 1454  INR 5.48*    Cardiac Enzymes: No results for input(s): CKTOTAL, CKMB, CKMBINDEX, TROPONINI in the last 168 hours.  BNP (last 3 results) No results for input(s): PROBNP in the last 8760 hours.  HbA1C: No results for input(s): HGBA1C in the last 72 hours.  CBG: No results for input(s): GLUCAP in the last 168 hours.  Lipid Profile: No results for input(s): CHOL, HDL, LDLCALC, TRIG, CHOLHDL, LDLDIRECT in the last 72 hours.  Thyroid Function Tests: No results for input(s): TSH, T4TOTAL, FREET4, T3FREE, THYROIDAB in the last 72 hours.  Anemia Panel: No results for input(s): VITAMINB12, FOLATE, FERRITIN, TIBC, IRON, RETICCTPCT in the last 72 hours.  Urine analysis:    Component Value Date/Time   COLORURINE YELLOW 07/26/2013 1202   APPEARANCEUR CLEAR 07/26/2013 1202   LABSPEC 1.016 07/26/2013 1202   PHURINE 7.0 07/26/2013 1202   GLUCOSEU NEGATIVE 07/26/2013 1202   HGBUR SMALL (A) 07/26/2013 1202   BILIRUBINUR NEGATIVE 07/26/2013 1202   KETONESUR NEGATIVE 07/26/2013 1202   PROTEINUR 100 (A) 07/26/2013 1202   UROBILINOGEN 1.0 07/26/2013 1202   NITRITE NEGATIVE 07/26/2013 1202   LEUKOCYTESUR SMALL (A) 07/26/2013 1202    Sepsis Labs: _0 (procalcitonin:4,lacticidven:4) )No results found for  this or any previous visit (from the past 240 hour(s)).   Radiological Exams on Admission: Dg Chest Portable 1 View  Result Date: 04/12/2016 CLINICAL DATA:  Productive cough, fever, shortness of breath during and after coughing episodes. Symptoms for 3 days. History of hypertension, diabetes, smoking. EXAM: PORTABLE CHEST 1 VIEW COMPARISON:  None. FINDINGS: Right-sided dialysis catheter tip overlies the level of the lower superior vena cava. Heart size is normal. Patient is slightly rotated towards the left. No focal consolidations or pleural effusions. No pulmonary edema. Bilateral shoulder arthroplasties. IMPRESSION: 1. Bronchitic changes. 2.  No focal acute pulmonary abnormality. Electronically Signed   By: Nolon Nations M.D.   On: 04/12/2016 15:37    EKG: Independently reviewed.  Assessment/Plan Active Problems:   End stage renal disease (HCC)   Diabetes mellitus type 2, diet-controlled (HCC)   Hypothyroidism   High cholesterol   Fever   Anemia due to vitamin B12 deficiency   Atrial fibrillation (HCC)   Sepsis (HCC)  Sepsis likely due to  HD line source, organism unknown Patient meets criteria given tachycardia, tachypnea, hypotension fever up yo 102, leukocytosis WBC 12 , and evidence of organ  dysfunction.  Antibiotics delivered in the ED with Vanc and Aztreonam. Initial Lactic acid 2.74 EKG Afib with tachy 110 . O2 normal at Admit to SDU. Received 2 L IVF  Sepsis order set  IV antibiotics by pharmacy with Vanc and Azttreonam  Follow lactic acid q 3 hrs Follow blood and urine cultures Procalcitonin order set  Blood and sputum , line Cultures. If line culture is positive, then will request CVTS evaluation for line removal and replacement for continuation of HD  I     ESRD on HD MWF, last on Monday 1/8   Cr 6.68   Baseline 5 . Primary Nephrologist Dr. Lorrene Reid  Nephrology involved, EDP Notified Renal  Renal Diet. Other plans as per Nephrology Check CMET in am   Atrial  Fibrillation CHA2DS2-VASc score 5-6 , on anticoagulation with Coumadin   Rate controlled -Continue meds, and Coumadin per Pharmacy    Hypotension, on chronic midrodine.  BP 82/56   Pulse 94    WIll prescribe Solucortef 100 mg x1 now and 50 mg q 6 hrs  Check am cortisol  Continue to monitor closely her VS   GERD, no acute symptoms: Continue PPI  Type II Diabetes Current blood sugar level is 150 Lab Results  Component Value Date   HGBA1C 4.7 (L) 01/22/2016  Hgb A1C SSI  Anemia of chronic disease Hemoglobin on admission 11.2 stable. On Aranesp w HD as per Renal  Repeat CBC in am Continue Iron supplements       DVT prophylaxis:  Coumadin per PHarmacy  Code Status:   Full      Family Communication:  Discussed with patient Disposition Plan: Expect patient to be discharged to home after condition improves Consults called:    None Admission status: SDU    [facility], PA-C [facility]   04/12/2016, 4:51 PM

## 2016-04-13 ENCOUNTER — Encounter (HOSPITAL_COMMUNITY): Payer: Self-pay

## 2016-04-13 ENCOUNTER — Inpatient Hospital Stay (HOSPITAL_COMMUNITY): Payer: Medicare Other

## 2016-04-13 DIAGNOSIS — E039 Hypothyroidism, unspecified: Secondary | ICD-10-CM

## 2016-04-13 DIAGNOSIS — R509 Fever, unspecified: Secondary | ICD-10-CM

## 2016-04-13 DIAGNOSIS — N186 End stage renal disease: Secondary | ICD-10-CM

## 2016-04-13 DIAGNOSIS — I480 Paroxysmal atrial fibrillation: Secondary | ICD-10-CM

## 2016-04-13 DIAGNOSIS — J209 Acute bronchitis, unspecified: Secondary | ICD-10-CM

## 2016-04-13 DIAGNOSIS — E119 Type 2 diabetes mellitus without complications: Secondary | ICD-10-CM

## 2016-04-13 DIAGNOSIS — A419 Sepsis, unspecified organism: Principal | ICD-10-CM

## 2016-04-13 LAB — PROTIME-INR
INR: 8.95
Prothrombin Time: 76.2 seconds — ABNORMAL HIGH (ref 11.4–15.2)

## 2016-04-13 LAB — COMPREHENSIVE METABOLIC PANEL
ALT: 36 U/L (ref 14–54)
AST: 42 U/L — ABNORMAL HIGH (ref 15–41)
Albumin: 2.9 g/dL — ABNORMAL LOW (ref 3.5–5.0)
Alkaline Phosphatase: 59 U/L (ref 38–126)
Anion gap: 10 (ref 5–15)
BUN: 39 mg/dL — ABNORMAL HIGH (ref 6–20)
CO2: 20 mmol/L — ABNORMAL LOW (ref 22–32)
Calcium: 7.5 mg/dL — ABNORMAL LOW (ref 8.9–10.3)
Chloride: 109 mmol/L (ref 101–111)
Creatinine, Ser: 7.14 mg/dL — ABNORMAL HIGH (ref 0.44–1.00)
GFR calc Af Amer: 6 mL/min — ABNORMAL LOW (ref >60)
GFR calc non Af Amer: 5 mL/min — ABNORMAL LOW (ref >60)
Glucose, Bld: 172 mg/dL — ABNORMAL HIGH (ref 65–99)
Potassium: 4 mmol/L (ref 3.5–5.1)
Sodium: 139 mmol/L (ref 135–145)
Total Bilirubin: 0.8 mg/dL (ref 0.3–1.2)
Total Protein: 5.6 g/dL — ABNORMAL LOW (ref 6.5–8.1)

## 2016-04-13 LAB — CBC
HCT: 31.4 % — ABNORMAL LOW (ref 36.0–46.0)
Hemoglobin: 10 g/dL — ABNORMAL LOW (ref 12.0–15.0)
MCH: 31.1 pg (ref 26.0–34.0)
MCHC: 31.8 g/dL (ref 30.0–36.0)
MCV: 97.5 fL (ref 78.0–100.0)
Platelets: 197 10*3/uL (ref 150–400)
RBC: 3.22 MIL/uL — ABNORMAL LOW (ref 3.87–5.11)
RDW: 15.5 % (ref 11.5–15.5)
WBC: 9.4 10*3/uL (ref 4.0–10.5)

## 2016-04-13 LAB — INFLUENZA PANEL BY PCR (TYPE A & B)
Influenza A By PCR: NEGATIVE
Influenza B By PCR: NEGATIVE

## 2016-04-13 LAB — GLUCOSE, CAPILLARY
Glucose-Capillary: 133 mg/dL — ABNORMAL HIGH (ref 65–99)
Glucose-Capillary: 146 mg/dL — ABNORMAL HIGH (ref 65–99)
Glucose-Capillary: 94 mg/dL (ref 65–99)
Glucose-Capillary: 132 mg/dL — ABNORMAL HIGH (ref 65–99)

## 2016-04-13 LAB — HEMOGLOBIN A1C
Hgb A1c MFr Bld: 5.4 % (ref 4.8–5.6)
Mean Plasma Glucose: 108 mg/dL

## 2016-04-13 LAB — LACTIC ACID, PLASMA: Lactic Acid, Venous: 2.7 mmol/L (ref 0.5–1.9)

## 2016-04-13 LAB — MRSA PCR SCREENING: MRSA by PCR: NEGATIVE

## 2016-04-13 LAB — CORTISOL: Cortisol, Plasma: 86.6 ug/dL

## 2016-04-13 MED ORDER — SODIUM CHLORIDE 0.9 % IV SOLN: 100.0000 mL | INTRAVENOUS | Status: DC | PRN

## 2016-04-13 MED ORDER — LIDOCAINE-PRILOCAINE 2.5-2.5 % EX CREA: 1.0000 "application " | TOPICAL_CREAM | CUTANEOUS | Status: DC | PRN

## 2016-04-13 MED ORDER — LIDOCAINE HCL (PF) 1 % IJ SOLN: 5.0000 mL | INTRAMUSCULAR | Status: DC | PRN

## 2016-04-13 MED ORDER — ORAL CARE MOUTH RINSE
15.0000 mL | Freq: Two times a day (BID) | OROMUCOSAL | Status: DC
  Administered 2016-04-13 – 2016-04-14 (×3): 15 mL via OROMUCOSAL

## 2016-04-13 MED ORDER — PENTAFLUOROPROP-TETRAFLUOROETH EX AERO: 1.0000 "application " | INHALATION_SPRAY | CUTANEOUS | Status: DC | PRN

## 2016-04-13 MED ORDER — FERRIC CITRATE 1 GM 210 MG(FE) PO TABS
210.0000 mg | ORAL_TABLET | Freq: Two times a day (BID) | ORAL | Status: DC | PRN
  Filled 2016-04-13: qty 1

## 2016-04-13 MED ORDER — CALCIUM CARBONATE ANTACID 500 MG PO CHEW
1.0000 | CHEWABLE_TABLET | Freq: Three times a day (TID) | ORAL | Status: DC
  Administered 2016-04-13 – 2016-04-14 (×3): 200 mg via ORAL
  Filled 2016-04-13 (×3): qty 1

## 2016-04-13 MED ORDER — HEPARIN SODIUM (PORCINE) 1000 UNIT/ML DIALYSIS: 1000.0000 [IU] | INTRAMUSCULAR | Status: DC | PRN

## 2016-04-13 MED ORDER — HYDROCORTISONE NA SUCCINATE PF 100 MG IJ SOLR
50.0000 mg | Freq: Two times a day (BID) | INTRAMUSCULAR | Status: DC
  Administered 2016-04-13 – 2016-04-14 (×2): 50 mg via INTRAVENOUS
  Filled 2016-04-13 (×2): qty 2

## 2016-04-13 MED ORDER — AZITHROMYCIN 250 MG PO TABS
250.0000 mg | ORAL_TABLET | Freq: Every day | ORAL | Status: DC
  Administered 2016-04-14: 250 mg via ORAL
  Filled 2016-04-13: qty 1

## 2016-04-13 MED ORDER — ALTEPLASE 2 MG IJ SOLR: 2.0000 mg | Freq: Once | INTRAMUSCULAR | Status: DC | PRN

## 2016-04-13 MED ORDER — ACETAMINOPHEN 325 MG PO TABS
ORAL_TABLET | ORAL | Status: AC
  Filled 2016-04-13: qty 1

## 2016-04-13 MED ORDER — FERRIC CITRATE 1 GM 210 MG(FE) PO TABS
420.0000 mg | ORAL_TABLET | Freq: Three times a day (TID) | ORAL | Status: DC
  Administered 2016-04-13 – 2016-04-14 (×4): 420 mg via ORAL
  Filled 2016-04-13 (×6): qty 2

## 2016-04-13 MED ORDER — AZITHROMYCIN 500 MG PO TABS
500.0000 mg | ORAL_TABLET | Freq: Every day | ORAL | Status: AC
  Administered 2016-04-13: 500 mg via ORAL
  Filled 2016-04-13: qty 1

## 2016-04-13 MED ORDER — VANCOMYCIN HCL IN DEXTROSE 1-5 GM/200ML-% IV SOLN
INTRAVENOUS | Status: AC
  Administered 2016-04-13: 1000 mg via INTRAVENOUS
  Filled 2016-04-13: qty 200

## 2016-04-13 MED ORDER — ALBUTEROL SULFATE (2.5 MG/3ML) 0.083% IN NEBU: 2.5000 mg | INHALATION_SOLUTION | Freq: Four times a day (QID) | RESPIRATORY_TRACT | Status: DC | PRN

## 2016-04-13 NOTE — ED Notes (Signed)
Attempted to contact pt's daughter to let her know the pt had a room however the voice mailbox was full.

## 2016-04-13 NOTE — Progress Notes (Signed)
ANTICOAGULATION CONSULT NOTE - Follow Up Consult  Pharmacy Consult for warfarin Indication: atrial fibrillation  Allergies  Allergen Reactions  . Amoxicillin Rash    Has patient had a PCN reaction causing immediate rash, facial/tongue/throat swelling, SOB or lightheadedness with hypotension:YES Has patient had a PCN reaction causing severe rash involving mucus membranes or skin necrosis: Yes Has patient had a PCN reaction that required hospitalization No Has patient had a PCN reaction occurring within the last 10 years: Yes If all of the above answers are "NO", then may proceed with Cephalosporin use.   . Sulfa Drugs Cross Reactors Hives and Itching  . Percocet [Oxycodone-Acetaminophen] Itching and Rash    Did not happen last time she took it  . Sulfa Antibiotics Hives    Patient Measurements: Height: _0  (167.6 cm) Weight: 204 lb 9.4 oz (92.8 kg) IBW/kg (Calculated) : 59.3  Vital Signs: Temp: (P) 97.6 F (36.4 C) (01/10 0700) Temp Source: (P) Oral (01/10 0700) BP: (P) 110/76 (01/10 0700) Pulse Rate: (P) 95 (01/10 0700)  Labs:  Recent Labs  04/12/16 1454 04/12/16 1721 04/13/16 0330 04/13/16 0332  HGB 11.7* 10.0*  --  10.0*  HCT 36.2 31.1*  --  31.4*  PLT 226 193  --  197  APTT  --  144*  --   --   LABPROT 51.5* 62.3* 76.2*  --   INR 5.48* 6.95* 8.95*  --   CREATININE 6.68* 6.28*  --  7.14*      Medical History: Past Medical History:  Diagnosis Date  . A-fib ([place])   . Anemia   . Arthritis   . Diabetes mellitus    "diet controlled"  . Diabetes mellitus without complication ([place])   . ESRD (end stage renal disease) ([place])    dialysis MWF  . GERD (gastroesophageal reflux disease)   . Gout   . History of blood transfusion   . HLD (hyperlipidemia)   . HOH (hard of hearing)    left ear  . Hypertension    hypotensive -since starting dialysis  . Hypothyroidism   . PAF (paroxysmal atrial fibrillation) ([place])    a. Echo 11/16:  Mild LVH, EF 55-60%, normal  wall motion, MAC, mild MR, severe LAE (49 ml/m2), mild RVE, normal RVSF, mild RAE, mild TR, PASP 24 mmHg;  CHADS2-VASc: 4 >> Coumadin followed by PCP  . Renal disorder      Assessment: Admitted with sepsis. On warfarin prior to admission for AFib. Admit INR 5.48> 8.7 despite no warfarin given. PTA warfarin dose: 5 mg q Wed, 7.5 mg all other days.  Goal of Therapy:  INR 2-3 Monitor platelets by anticoagulation protocol: Yes    Plan:  -Hold warfarin -Daily INR   Bonnita Nasuti Pharm.D. CPP, BCPS Clinical Pharmacist [phone] 04/13/2016 9:25 AM

## 2016-04-13 NOTE — Progress Notes (Signed)
[facility] Progress Note   Subjective: feeling some better, no new c/o  Vitals:   04/13/16 0300 04/13/16 0400 04/13/16 0500 04/13/16 0700  BP: 93/71 97/75  (P) 110/76  Pulse: 62 87  (P) 95  Resp: 15 14  (!) (P) 26  Temp:    (P) 97.6 F (36.4 C)  TempSrc:    (P) Oral  SpO2: 98% 99%  (P) 100%  Weight:   92.8 kg (204 lb 9.4 oz)   Height:   5\' 6"  (1.676 m)     Inpatient medications: . aztreonam  500 mg Intravenous Q12H  . cinacalcet  30 mg Oral QODAY  . colchicine  0.6 mg Oral Daily  . doxercalciferol  0.5 mcg Oral Q M,W,F-HD  . ezetimibe  10 mg Oral Daily  . ferric citrate  420 mg Oral TID WC  . gabapentin  100 mg Oral QHS  . hydrocortisone sod succinate (SOLU-CORTEF) inj  50 mg Intravenous Q6H  . insulin aspart  0-9 Units Subcutaneous TID WC  . levothyroxine  100 mcg Oral QAC breakfast  . mouth rinse  15 mL Mouth Rinse BID  . midodrine  10 mg Oral TID  . mometasone-formoterol  2 puff Inhalation BID  . pantoprazole  40 mg Oral Daily  . polyethylene glycol  17 g Oral Daily  . vancomycin  1,000 mg Intravenous Q M,W,F-HD    albuterol, bisacodyl, ferric citrate, HYDROcodone-acetaminophen, ondansetron **OR** ondansetron (ZOFRAN) IV, senna-docusate, traZODone  Exam: Gen elderly WF, cough better, no distress No jvd or bruits Chest some exp wheeze and scattered rhonchi, improved RRR no MRG Abd soft ntnd no mass or ascites +bs obese GU defer MS no joint effusions or deformity Ext no sig LE edema / no wounds or ulcers Neuro is alert, Ox 3 , nf  CXR no active disease EKG afib with VR 98, LVH  Dialysis: [facility]  MWF 4h  R IJ cath  2/2.5 bath  87kg   Hep none  - 3 Hect tiw  Assessment: 1. Fever/ hypotension - r/o sepsis. Blood cx's pending.  PNA/ bronchitis/ URI vs cath sepsis. Better. CXR clear.   2. ESRD HD mwf 3. Volume - no excess on exam. Up 4kg after IVF's 4. Chronic afib - on coumadin 5. Chronic hypotension - on midodrine 6. 2HPTH - cont  meds 7. Anemia of CKD - Hb 10-11, no need esa now  Plan - HD today, UF 2-3kg   Kelly Splinter MD [facility] pager [phone]   04/13/2016, 10:14 AM    Recent Labs Lab 04/12/16 1454 04/12/16 1721 04/13/16 0332  NA 138 139 139  K 3.6 3.7 4.0  CL 99* 105 109  CO2 24 22 20*  GLUCOSE 150* 145* 172*  BUN 33* 33* 39*  CREATININE 6.68* 6.28* 7.14*  CALCIUM 8.4* 7.3* 7.5*    Recent Labs Lab 04/12/16 1454 04/12/16 1721 04/13/16 0332  AST 41 34 42*  ALT 33 28 36  ALKPHOS 64 53 59  BILITOT 0.7 0.6 0.8  PROT 6.7 5.7* 5.6*  ALBUMIN 3.6 3.0* 2.9*    Recent Labs Lab 04/12/16 1454 04/12/16 1721 04/13/16 0332  WBC 12.8* 10.7* 9.4  NEUTROABS 10.2* 10.1*  --   HGB 11.7* 10.0* 10.0*  HCT 36.2 31.1* 31.4*  MCV 97.8 98.1 97.5  PLT 226 193 197   Iron/TIBC/Ferritin/ %Sat    Component Value Date/Time   IRON 52 05/04/2015 1109   TIBC 290 11/12/2014 0914   FERRITIN 503.4 (H)  05/04/2015 1109   IRONPCTSAT 22.0 05/04/2015 1109

## 2016-04-13 NOTE — Progress Notes (Signed)
PROGRESS NOTE    [name]  GBT:[id number] DOB: 17-Jan-1941 DOA: 04/12/2016  PCP: Sherrie Mustache, MD   Brief Narrative:  [name] is a 76 y.o. female with medical history significant for extensive medical history listed below, including ESRD on HD MWF, last 04/12/15, atrial fibrillation on chronic Coumadin, diabetes, and hypotension. Patient was sent from the doctor's office to the emergency department for evaluation of chest congestion, cough, shortness of breath  X 3 days and fever up to 102 with chills.  Subjective: Slightly better today. No fever or chills. Cough with yellow sputum now.   Assessment & Plan:   Principal Problem:   Acute bronchitis, Acute respiratory failure - cough x 3 days with yellow sputum now - Z- pak -  Symptomatic treatment of cough - Flu negative - wean O2 as able- on 4 L at this time  Active Problems: Fever, leukocytosis, hypotension >> Sepsis - likely due to above however, as she has a dialysis cath, being cautious and monitoring for bacteremia - on Vanc and Azactam for this- will d/c as long as blood cultures negative tomorrow  Hypotension - chronic issue- cont Midodrine - On Solu-cortef QID started in ER- wean to BID today    End stage renal disease ([place]) - Nephrology notified    Diabetes mellitus type 2, diet-controlled - SSI while here     Hypothyroidism - synthroid    Atrial fibrillation - INR elevated- Coumadin on hold - no on rate controlling agent    DVT prophylaxis: INR elevated Code Status: DNR Family Communication: daughter Disposition Plan: home when stable Consultants:   nephrology Procedures:    Antimicrobials:  Anti-infectives    Start     Dose/Rate Route Frequency Ordered Stop   04/14/16 1000  azithromycin (ZITHROMAX) tablet 250 mg     250 mg Oral Daily 04/13/16 1152 04/18/16 0959   04/13/16 1230  azithromycin (ZITHROMAX) tablet 500 mg     500 mg Oral Daily 04/13/16 1152 04/14/16 0959   04/13/16 1200   vancomycin (VANCOCIN) IVPB 1000 mg/200 mL premix  Status:  Discontinued     1,000 mg 200 mL/hr over 60 Minutes Intravenous Every M-W-F (Hemodialysis) 04/12/16 1557 04/12/16 1706   04/13/16 1200  vancomycin (VANCOCIN) IVPB 1000 mg/200 mL premix     1,000 mg 200 mL/hr over 60 Minutes Intravenous Every M-W-F (Hemodialysis) 04/12/16 1706     04/13/16 0400  aztreonam (AZACTAM) 500 mg in dextrose 5 % 50 mL IVPB  Status:  Discontinued     500 mg 100 mL/hr over 30 Minutes Intravenous Every 12 hours 04/12/16 1557 04/12/16 1706   04/13/16 0400  aztreonam (AZACTAM) 500 mg in dextrose 5 % 50 mL IVPB     500 mg 100 mL/hr over 30 Minutes Intravenous Every 12 hours 04/12/16 1706     04/12/16 1600  vancomycin (VANCOCIN) 1,750 mg in sodium chloride 0.9 % 500 mL IVPB     1,750 mg 250 mL/hr over 120 Minutes Intravenous  Once 04/12/16 1526 04/12/16 1813   04/12/16 1530  aztreonam (AZACTAM) 2 g in dextrose 5 % 50 mL IVPB     2 g 100 mL/hr over 30 Minutes Intravenous  Once 04/12/16 1520 04/12/16 1610   04/12/16 1530  vancomycin (VANCOCIN) IVPB 1000 mg/200 mL premix  Status:  Discontinued     1,000 mg 200 mL/hr over 60 Minutes Intravenous  Once 04/12/16 1520 04/12/16 1526       Objective: Vitals:   04/13/16 0400 04/13/16  0500 04/13/16 0700 04/13/16 1100  BP: 97/75  110/76 119/87  Pulse: 87  95 82  Resp: 14  (!) 26 20  Temp:   97.6 F (36.4 C) 97.6 F (36.4 C)  TempSrc:   Oral Oral  SpO2: 99%  100% 99%  Weight:  92.8 kg (204 lb 9.4 oz)    Height:  5\' 6"  (1.676 m)      Intake/Output Summary (Last 24 hours) at 04/13/16 1435 Last data filed at 04/13/16 0609  Gross per 24 hour  Intake             2040 ml  Output                0 ml  Net             2040 ml   Filed Weights   04/12/16 1450 04/13/16 0500  Weight: 87.5 kg (193 lb) 92.8 kg (204 lb 9.4 oz)    Examination: General exam: Appears comfortable  HEENT: PERRLA, oral mucosa moist, no sclera icterus or thrush Respiratory system: Clear  to auscultation. Respiratory effort normal. Cardiovascular system: S1 & S2 heard, RRR.  No murmurs  Gastrointestinal system: Abdomen soft, non-tender, nondistended. Normal bowel sound. No organomegaly Central nervous system: Alert and oriented. No focal neurological deficits. Extremities: No cyanosis, clubbing or edema Skin: No rashes or ulcers Psychiatry:  Mood & affect appropriate.     Data Reviewed: I have personally reviewed following labs and imaging studies  CBC:  Recent Labs Lab 04/12/16 1454 04/12/16 1721 04/13/16 0332  WBC 12.8* 10.7* 9.4  NEUTROABS 10.2* 10.1*  --   HGB 11.7* 10.0* 10.0*  HCT 36.2 31.1* 31.4*  MCV 97.8 98.1 97.5  PLT 226 193 161   Basic Metabolic Panel:  Recent Labs Lab 04/12/16 1454 04/12/16 1721 04/13/16 0332  NA 138 139 139  K 3.6 3.7 4.0  CL 99* 105 109  CO2 24 22 20*  GLUCOSE 150* 145* 172*  BUN 33* 33* 39*  CREATININE 6.68* 6.28* 7.14*  CALCIUM 8.4* 7.3* 7.5*   GFR: Estimated Creatinine Clearance: 7.8 mL/min (by C-G formula based on SCr of 7.14 mg/dL (H)). Liver Function Tests:  Recent Labs Lab 04/12/16 1454 04/12/16 1721 04/13/16 0332  AST 41 34 42*  ALT 33 28 36  ALKPHOS 64 53 59  BILITOT 0.7 0.6 0.8  PROT 6.7 5.7* 5.6*  ALBUMIN 3.6 3.0* 2.9*   No results for input(s): LIPASE, AMYLASE in the last 168 hours. No results for input(s): AMMONIA in the last 168 hours. Coagulation Profile:  Recent Labs Lab 04/12/16 1454 04/12/16 1721 04/13/16 0330  INR 5.48* 6.95* 8.95*   Cardiac Enzymes: No results for input(s): CKTOTAL, CKMB, CKMBINDEX, TROPONINI in the last 168 hours. BNP (last 3 results) No results for input(s): PROBNP in the last 8760 hours. HbA1C:  Recent Labs  04/12/16 1721  HGBA1C 5.4   CBG:  Recent Labs Lab 04/13/16 0805 04/13/16 1129  GLUCAP 133* 146*   Lipid Profile: No results for input(s): CHOL, HDL, LDLCALC, TRIG, CHOLHDL, LDLDIRECT in the last 72 hours. Thyroid Function Tests: No  results for input(s): TSH, T4TOTAL, FREET4, T3FREE, THYROIDAB in the last 72 hours. Anemia Panel: No results for input(s): VITAMINB12, FOLATE, FERRITIN, TIBC, IRON, RETICCTPCT in the last 72 hours. Urine analysis:    Component Value Date/Time   COLORURINE YELLOW 07/26/2013 1202   APPEARANCEUR CLEAR 07/26/2013 1202   LABSPEC 1.016 07/26/2013 1202   PHURINE 7.0 07/26/2013 1202   GLUCOSEU  NEGATIVE 07/26/2013 1202   HGBUR SMALL (A) 07/26/2013 1202   BILIRUBINUR NEGATIVE 07/26/2013 1202   KETONESUR NEGATIVE 07/26/2013 1202   PROTEINUR 100 (A) 07/26/2013 1202   UROBILINOGEN 1.0 07/26/2013 1202   NITRITE NEGATIVE 07/26/2013 1202   LEUKOCYTESUR SMALL (A) 07/26/2013 1202   Sepsis Labs: @LABRCNTIP (procalcitonin:4,lacticidven:4) ) Recent Results (from the past 240 hour(s))  Culture, blood (Routine x 2)     Status: None (Preliminary result)   Collection Time: 04/12/16  3:05 PM  Result Value Ref Range Status   Specimen Description BLOOD LEFT HAND  Final   Special Requests BOTTLES DRAWN AEROBIC ONLY 10CC  Final   Culture NO GROWTH < 24 HOURS  Final   Report Status PENDING  Incomplete  MRSA PCR Screening     Status: None   Collection Time: 04/13/16  5:08 AM  Result Value Ref Range Status   MRSA by PCR NEGATIVE NEGATIVE Final    Comment:        The GeneXpert MRSA Assay (FDA approved for NASAL specimens only), is one component of a comprehensive MRSA colonization surveillance program. It is not intended to diagnose MRSA infection nor to guide or monitor treatment for MRSA infections.          Radiology Studies: Dg Chest Port 1 View  Result Date: 04/13/2016 CLINICAL DATA:  Sepsis and shortness of breath tonight. EXAM: PORTABLE CHEST 1 VIEW COMPARISON:  04/12/2016 FINDINGS: Mild cardiac enlargement without vascular congestion. No airspace disease or consolidation in the lungs. No blunting of costophrenic angles. No pneumothorax. Calcification of the aorta. Postoperative changes in  both shoulders. IMPRESSION: Mild cardiac enlargement.  No evidence of active pulmonary disease. Electronically Signed   By: Lucienne Capers M.D.   On: 04/13/2016 04:32   Dg Chest Portable 1 View  Result Date: 04/12/2016 CLINICAL DATA:  Productive cough, fever, shortness of breath during and after coughing episodes. Symptoms for 3 days. History of hypertension, diabetes, smoking. EXAM: PORTABLE CHEST 1 VIEW COMPARISON:  None. FINDINGS: Right-sided dialysis catheter tip overlies the level of the lower superior vena cava. Heart size is normal. Patient is slightly rotated towards the left. No focal consolidations or pleural effusions. No pulmonary edema. Bilateral shoulder arthroplasties. IMPRESSION: 1. Bronchitic changes. 2.  No focal acute pulmonary abnormality. Electronically Signed   By: Nolon Nations M.D.   On: 04/12/2016 15:37      Scheduled Meds: . azithromycin  500 mg Oral Daily   Followed by  . [START ON 04/14/2016] azithromycin  250 mg Oral Daily  . aztreonam  500 mg Intravenous Q12H  . calcium carbonate  1 tablet Oral TID PC  . cinacalcet  30 mg Oral QODAY  . colchicine  0.6 mg Oral Daily  . doxercalciferol  0.5 mcg Oral Q M,W,F-HD  . ezetimibe  10 mg Oral Daily  . ferric citrate  420 mg Oral TID WC  . gabapentin  100 mg Oral QHS  . hydrocortisone sod succinate (SOLU-CORTEF) inj  50 mg Intravenous Q12H  . insulin aspart  0-9 Units Subcutaneous TID WC  . levothyroxine  100 mcg Oral QAC breakfast  . mouth rinse  15 mL Mouth Rinse BID  . midodrine  10 mg Oral TID  . mometasone-formoterol  2 puff Inhalation BID  . pantoprazole  40 mg Oral Daily  . polyethylene glycol  17 g Oral Daily  . vancomycin  1,000 mg Intravenous Q M,W,F-HD   Continuous Infusions:   LOS: 1 day    Time spent in  minutes: 50    Jhania Etherington, MD [facility] Pager: [email or web address] Password [email or web address] 04/13/2016, 2:35 PM

## 2016-04-13 NOTE — ED Notes (Signed)
Pt's daughter Baldo Ash would like to be notified when pt gets a bed. [phone].

## 2016-04-13 NOTE — Progress Notes (Signed)
PT Cancellation Note  Patient Details Name: [name] MRN: [id number] DOB: July 30, 1940   Cancelled Treatment:    Reason Eval/Treat Not Completed: Patient at procedure or test/unavailable;Medical issues which prohibited therapy; patient in HD and currently INR too high to mobilize.  Will attempt another day.   Reginia Naas 04/13/2016, 2:53 PM  Magda Kiel, [place] 04/13/2016

## 2016-04-14 DIAGNOSIS — J9601 Acute respiratory failure with hypoxia: Secondary | ICD-10-CM

## 2016-04-14 LAB — CBC
HCT: 35.2 % — ABNORMAL LOW (ref 36.0–46.0)
Hemoglobin: 11.4 g/dL — ABNORMAL LOW (ref 12.0–15.0)
MCH: 31.1 pg (ref 26.0–34.0)
MCHC: 32.4 g/dL (ref 30.0–36.0)
MCV: 96.2 fL (ref 78.0–100.0)
Platelets: 264 10*3/uL (ref 150–400)
RBC: 3.66 MIL/uL — ABNORMAL LOW (ref 3.87–5.11)
RDW: 15.4 % (ref 11.5–15.5)
WBC: 14.4 10*3/uL — ABNORMAL HIGH (ref 4.0–10.5)

## 2016-04-14 LAB — GLUCOSE, CAPILLARY
Glucose-Capillary: 88 mg/dL (ref 65–99)
Glucose-Capillary: 102 mg/dL — ABNORMAL HIGH (ref 65–99)

## 2016-04-14 LAB — PROTIME-INR
INR: 4.68
Prothrombin Time: 45.4 seconds — ABNORMAL HIGH (ref 11.4–15.2)

## 2016-04-14 MED ORDER — OXYMETAZOLINE HCL 0.05 % NA SOLN
3.0000 | NASAL | Status: AC
  Administered 2016-04-14: 3 via NASAL
  Filled 2016-04-14: qty 15

## 2016-04-14 MED ORDER — VITAMIN K1 1 MG/0.5ML IJ SOLN
2.0000 mg | Freq: Once | INTRAMUSCULAR | Status: DC
  Filled 2016-04-14: qty 1

## 2016-04-14 MED ORDER — COLCHICINE 0.6 MG PO TABS: 0.6000 mg | ORAL_TABLET | ORAL | Status: DC

## 2016-04-14 MED ORDER — PHYTONADIONE 5 MG PO TABS
2.5000 mg | ORAL_TABLET | Freq: Once | ORAL | Status: AC
  Administered 2016-04-14: 2.5 mg via ORAL
  Filled 2016-04-14: qty 1

## 2016-04-14 MED ORDER — AZITHROMYCIN 250 MG PO TABS: 250.0000 mg | ORAL_TABLET | Freq: Every day | ORAL | 0 refills | Status: AC

## 2016-04-14 NOTE — Care Management Note (Signed)
Case Management Note  Patient Details  Name: [name] MRN: [id number] Date of Birth: December 07, 1940  Subjective/Objective: Pt medically stable for discharge home today with family.  PT recommending no follow up.                     Action/Plan: No discharge needs identified.  PT/INR to be rechecked tomorrow at dialysis.    Expected Discharge Date:     04/14/16             Expected Discharge Plan:  Home/Self Care  In-House Referral:     Discharge planning Services  CM Consult  Post Acute Care Choice:    Choice offered to:     DME Arranged:    DME Agency:     HH Arranged:    HH Agency:     Status of Service:  Completed, signed off  If discussed at [employer] of Stay Meetings, dates discussed:    Additional Comments:  Ella Bodo, RN 04/14/2016, 1:39 PM

## 2016-04-14 NOTE — Progress Notes (Signed)
Patient discharged home with caregiver Elinor Kleine.  All discharge instructions reviewed, all questions answered, and patient and family voiced understanding.  All patient belongings returned to patient and peripheral IVs removed.

## 2016-04-14 NOTE — Discharge Instructions (Signed)
INR needs to be checked tomorrow. Please ask your doctor to follow up on INR and resume your Coumadin when safe.     Please take all your medications with you for your next visit with your Primary MD. Please request your Primary MD to go over all hospital test results at the follow up. Please ask your Primary MD to get all Hospital records sent to his/her office.  If you experience worsening of your admission symptoms, develop shortness of breath, chest pain, suicidal or homicidal thoughts or a life threatening emergency, you must seek medical attention immediately by calling 911 or calling your MD. Your INR needs to be checked tomorrow at dialysis. Do not resume Coumadin until told so b You must read the complete instructions/literature along with all the possible adverse reactions/side effects for all the medicines you take including new medications that have been prescribed to you. Take new medicines after you have completely understood and accpet all the possible adverse reactions/side effects.   Do not drive when taking pain medications or sedatives.    Do not take more than prescribed Pain, Sleep and Anxiety Medications  If you have smoked or chewed Tobacco in the last 2 yrs please stop. Stop any regular alcohol and or recreational drug use.  Wear Seat belts while driving.

## 2016-04-14 NOTE — Progress Notes (Signed)
[name] Progress Note   Subjective: Up in chair, no C/Os. Anxious to go home.   Objective Vitals:   04/14/16 1023 04/14/16 1024 04/14/16 1200 04/14/16 1230  BP: 105/76 105/76  121/80  Pulse: 84 84 84 80  Resp: (!) 22 (!) 24 (!) 21 20  Temp:    97.9 F (36.6 C)  TempSrc:    Oral  SpO2: 92% 94% 95% 95%  Weight:      Height:       Physical Exam General: WN,WD elderly pt in NAD Heart:S1,S2 No M/R/G Lungs: Bilateral breath sounds few coarse breath sounds. No WOB.  Abdomen: soft, non-tender Extremities: No LE edema Dialysis Access: RIJ cath, drsg CDI.    Additional Objective Labs: Basic Metabolic Panel:  Recent Labs Lab 04/12/16 1454 04/12/16 1721 04/13/16 0332  NA 138 139 139  K 3.6 3.7 4.0  CL 99* 105 109  CO2 24 22 20*  GLUCOSE 150* 145* 172*  BUN 33* 33* 39*  CREATININE 6.68* 6.28* 7.14*  CALCIUM 8.4* 7.3* 7.5*   Liver Function Tests:  Recent Labs Lab 04/12/16 1454 04/12/16 1721 04/13/16 0332  AST 41 34 42*  ALT 33 28 36  ALKPHOS 64 53 59  BILITOT 0.7 0.6 0.8  PROT 6.7 5.7* 5.6*  ALBUMIN 3.6 3.0* 2.9*   No results for input(s): LIPASE, AMYLASE in the last 168 hours. CBC:  Recent Labs Lab 04/12/16 1454 04/12/16 1721 04/13/16 0332 04/14/16 0217  WBC 12.8* 10.7* 9.4 14.4*  NEUTROABS 10.2* 10.1*  --   --   HGB 11.7* 10.0* 10.0* 11.4*  HCT 36.2 31.1* 31.4* 35.2*  MCV 97.8 98.1 97.5 96.2  PLT 226 193 197 264   Blood Culture    Component Value Date/Time   SDES BLOOD LEFT HAND 04/12/2016 1505   SPECREQUEST BOTTLES DRAWN AEROBIC ONLY 10CC 04/12/2016 1505   CULT NO GROWTH < 24 HOURS 04/12/2016 1505   REPTSTATUS PENDING 04/12/2016 1505    Cardiac Enzymes: No results for input(s): CKTOTAL, CKMB, CKMBINDEX, TROPONINI in the last 168 hours. CBG:  Recent Labs Lab 04/13/16 1129 04/13/16 1752 04/13/16 2158 04/14/16 0822 04/14/16 1232  GLUCAP 146* 94 132* 88 102*   Iron Studies: No results for input(s): IRON, TIBC,  TRANSFERRIN, FERRITIN in the last 72 hours. @lablastinr3 @ Studies/Results: Dg Chest Port 1 View  Result Date: 04/13/2016 CLINICAL DATA:  Sepsis and shortness of breath tonight. EXAM: PORTABLE CHEST 1 VIEW COMPARISON:  04/12/2016 FINDINGS: Mild cardiac enlargement without vascular congestion. No airspace disease or consolidation in the lungs. No blunting of costophrenic angles. No pneumothorax. Calcification of the aorta. Postoperative changes in both shoulders. IMPRESSION: Mild cardiac enlargement.  No evidence of active pulmonary disease. Electronically Signed   By: Lucienne Capers M.D.   On: 04/13/2016 04:32   Dg Chest Portable 1 View  Result Date: 04/12/2016 CLINICAL DATA:  Productive cough, fever, shortness of breath during and after coughing episodes. Symptoms for 3 days. History of hypertension, diabetes, smoking. EXAM: PORTABLE CHEST 1 VIEW COMPARISON:  None. FINDINGS: Right-sided dialysis catheter tip overlies the level of the lower superior vena cava. Heart size is normal. Patient is slightly rotated towards the left. No focal consolidations or pleural effusions. No pulmonary edema. Bilateral shoulder arthroplasties. IMPRESSION: 1. Bronchitic changes. 2.  No focal acute pulmonary abnormality. Electronically Signed   By: Nolon Nations M.D.   On: 04/12/2016 15:37   Medications:  . azithromycin  250 mg Oral Daily  . aztreonam  500 mg Intravenous  Q12H  . calcium carbonate  1 tablet Oral TID PC  . cinacalcet  30 mg Oral QODAY  . doxercalciferol  0.5 mcg Oral Q M,W,F-HD  . ezetimibe  10 mg Oral Daily  . ferric citrate  420 mg Oral TID WC  . gabapentin  100 mg Oral QHS  . hydrocortisone sod succinate (SOLU-CORTEF) inj  50 mg Intravenous Q12H  . insulin aspart  0-9 Units Subcutaneous TID WC  . levothyroxine  100 mcg Oral QAC breakfast  . mouth rinse  15 mL Mouth Rinse BID  . midodrine  10 mg Oral TID  . mometasone-formoterol  2 puff Inhalation BID  . pantoprazole  40 mg Oral Daily  .  polyethylene glycol  17 g Oral Daily  . vancomycin  1,000 mg Intravenous Q M,W,F-HD     Dialysis:[facility] MWF 4h R IJ cath 2/2.5 bath 87kg Hep none - 3 Hect tiw  Assessment: 1. Fever/ hypotension - r/o sepsis. Blood cx's NG x 24 hours. PNA/ bronchitis/ URI vs cath sepsis. Better. CXR clear. DC orders noted.  2. ESRD HD MWF. Next HD at OP HD center tomorrow.  3. Volume - HD yesterday pre wt 95.7 kg Net UF 3000 Post wt 92.7 kg. Still above OP EDW. Was leaving sl above OP EDW. Raise EDW to 88 kg on DC.  4. H/O Chronic afib - Coumadin on hold. INR 4.68 today.  5. Chronic hypotension - on midodrine 6. 2HPTH - cont meds 7. Anemia of CKD - Hb 10-11, no need esa now  Disposition: DC home today. Will need F/U INR to be drawn at HD unit tomorrow.   Rita H. Brown NP-C 04/14/2016, 1:30 PM  [facility] [phone]  Pt seen, examined and agree w A/P as above.  Kelly Splinter MD [employer] pager [phone]   04/14/2016, 1:49 PM

## 2016-04-14 NOTE — Discharge Summary (Addendum)
Physician Discharge Summary  [name] OVF:[id number] DOB: 1940-11-24 DOA: 04/12/2016  PCP: Sherrie Mustache, MD  Admit date: 04/12/2016 Discharge date: 04/14/2016  Admitted From: home  Disposition:  home   Recommendations for Outpatient Follow-up:  1. INR to be checked at dialysis tomorrow- Coumadin needs to be resumed   Discharge Condition:  stable   CODE STATUS:  DNR   Diet recommendation:  Renal, diabetic, heart healhy Consultations:  Nephrology     Discharge Diagnoses:  Principal Problem:   Acute bronchitis Active Problems:   Sepsis ([place])   Acute respiratory failure ([facility])   End stage renal disease ([place])   Diabetes mellitus type 2, diet-controlled ([place])   Hypothyroidism   High cholesterol   Fever   Anemia due to vitamin B12 deficiency   Atrial fibrillation ([facility])    Subjective: Cough improving. No dyspnea or chest pain. Had nose bleed last night which has resolved.    Brief Summary: [name] a 76 y.o.femalewith medical history significant for extensive medical history listed below, including ESRD on HD MWF, last 04/12/15, atrial fibrillation on chronic Coumadin, diabetes, and hypotension. Patient was sent from the doctor's office to the emergency department for evaluation of chest congestion, cough, shortness of breath  X 3 days and fever up to 102 with chills.   Hospital Course:  Principal Problem:   Acute bronchitis, Acute respiratory failure - cough x 3 days with yellow sputum   - Z- pak started -  Symptomatic treatment of cough - symptoms have been improving - Flu negative - has been weaned off of 4 L O 2 which she was requiring yesterday  Active Problems: Fever, leukocytosis, hypotension >> Sepsis - likely due to above however, as she has a dialysis cath, monitored for bacteremia -given Vanc and Azactam for this-  as blood cultures are negative, they have been d/c'd  Hypotension - chronic issue- cont Midodrine - On Solu-cortef QID  started in ER- weaned off with stable BP    End stage renal disease ([place]) - Nephrology notified- underwent dialysis during hospital stay    Diabetes mellitus type 2, diet-controlled - started on SSI while here     Hypothyroidism - synthroid    Atrial fibrillation - INR elevated- Coumadin on hold- will need to recheck INR at dialysis again tomorrow- cont to hold Coumadin for now - not on rate controlling agent   Discharge Instructions  Discharge Instructions    Diet general    Complete by:  As directed    Carb modified, renal heart healthy diet   Increase activity slowly    Complete by:  As directed      Allergies as of 04/14/2016      Reactions   Amoxicillin Rash   Has patient had a PCN reaction causing immediate rash, facial/tongue/throat swelling, SOB or lightheadedness with hypotension:YES Has patient had a PCN reaction causing severe rash involving mucus membranes or skin necrosis: Yes Has patient had a PCN reaction that required hospitalization No Has patient had a PCN reaction occurring within the last 10 years: Yes If all of the above answers are "NO", then may proceed with Cephalosporin use.   Sulfa Drugs Cross Reactors Hives, Itching   Percocet [oxycodone-acetaminophen] Itching, Rash   Did not happen last time she took it   Sulfa Antibiotics Hives      Medication List    STOP taking these medications   warfarin 5 MG tablet Commonly known as:  COUMADIN   warfarin 7.5 MG  tablet Commonly known as:  COUMADIN     TAKE these medications   ACCU-CHEK AVIVA PLUS test strip Generic drug:  glucose blood   albuterol 108 (90 Base) MCG/ACT inhaler Commonly known as:  PROVENTIL HFA;VENTOLIN HFA Inhale 2 puffs into the lungs every 6 (six) hours as needed for wheezing or shortness of breath.   AURYXIA 1 GM 210 MG(Fe) tablet Generic drug:  ferric citrate Take 2 tablets by mouth 3 (three) times daily before meals.   AURYXIA 1 GM 210 MG(Fe) tablet Generic drug:   ferric citrate Take 1 tablet by mouth with snacks.   azithromycin 250 MG tablet Commonly known as:  ZITHROMAX Take 1 tablet (250 mg total) by mouth daily.   budesonide-formoterol 80-4.5 MCG/ACT inhaler Commonly known as:  SYMBICORT Take 2 puffs first thing in am and then another 2 puffs about 12 hours later.   as needed   cinacalcet 30 MG tablet Commonly known as:  SENSIPAR Take 30 mg by mouth every other day.   colchicine 0.6 MG tablet Take 1 tablet (0.6 mg total) by mouth every 3 (three) days. Dose needs to be decreased to twice a week in dialysis patient. What changed:  See the new instructions.   Darbepoetin Alfa 60 MCG/0.3ML Sosy injection Commonly known as:  ARANESP Inject 0.3 mLs (60 mcg total) into the vein every Thursday with hemodialysis. What changed:  when to take this   doxercalciferol 0.5 MCG capsule Commonly known as:  HECTOROL Take 0.5 mcg by mouth every Monday, Wednesday, and Friday with hemodialysis. Every Mon / Wed / Fri  w/dialysis   ezetimibe 10 MG tablet Commonly known as:  ZETIA Take 10 mg by mouth daily.   HYDROcodone-acetaminophen 5-325 MG tablet Commonly known as:  NORCO Take 1 tablet by mouth every 6 (six) hours as needed for moderate pain.   levothyroxine 100 MCG tablet Commonly known as:  SYNTHROID, LEVOTHROID Take 100 mcg by mouth daily.   midodrine 10 MG tablet Commonly known as:  PROAMATINE Take 10 mg by mouth 3 (three) times daily.   multivitamin Tabs tablet Take 1 tablet by mouth daily.   NEURONTIN 100 MG capsule Generic drug:  gabapentin Take 100 mg by mouth at bedtime.   pantoprazole 40 MG tablet Commonly known as:  PROTONIX Take 1 tablet (40 mg total) by mouth daily. Take 30-60 min before first meal of the day   polyethylene glycol packet Commonly known as:  MIRALAX / GLYCOLAX Take 17 g by mouth daily.   Vitamin D (Ergocalciferol) 50000 units Caps capsule Commonly known as:  DRISDOL Take 50,000 Units by mouth every  Monday.      Follow-up Information    Sherrie Mustache, MD. Schedule an appointment as soon as possible for a visit in 2 day(s).   Specialty:  Family Medicine Why:  Coumadin must be resumed when INR improves Contact information: [address] [place] [address] [phone]          Allergies  Allergen Reactions  . Amoxicillin Rash    Has patient had a PCN reaction causing immediate rash, facial/tongue/throat swelling, SOB or lightheadedness with hypotension:YES Has patient had a PCN reaction causing severe rash involving mucus membranes or skin necrosis: Yes Has patient had a PCN reaction that required hospitalization No Has patient had a PCN reaction occurring within the last 10 years: Yes If all of the above answers are "NO", then may proceed with Cephalosporin use.   . Sulfa Drugs Cross Reactors Hives and Itching  .  Percocet [Oxycodone-Acetaminophen] Itching and Rash    Did not happen last time she took it  . Sulfa Antibiotics Hives     Procedures/Studies:  Dg Chest Port 1 View  Result Date: 04/13/2016 CLINICAL DATA:  Sepsis and shortness of breath tonight. EXAM: PORTABLE CHEST 1 VIEW COMPARISON:  04/12/2016 FINDINGS: Mild cardiac enlargement without vascular congestion. No airspace disease or consolidation in the lungs. No blunting of costophrenic angles. No pneumothorax. Calcification of the aorta. Postoperative changes in both shoulders. IMPRESSION: Mild cardiac enlargement.  No evidence of active pulmonary disease. Electronically Signed   By: Lucienne Capers M.D.   On: 04/13/2016 04:32   Dg Chest Portable 1 View  Result Date: 04/12/2016 CLINICAL DATA:  Productive cough, fever, shortness of breath during and after coughing episodes. Symptoms for 3 days. History of hypertension, diabetes, smoking. EXAM: PORTABLE CHEST 1 VIEW COMPARISON:  None. FINDINGS: Right-sided dialysis catheter tip overlies the level of the lower superior vena cava. Heart size is  normal. Patient is slightly rotated towards the left. No focal consolidations or pleural effusions. No pulmonary edema. Bilateral shoulder arthroplasties. IMPRESSION: 1. Bronchitic changes. 2.  No focal acute pulmonary abnormality. Electronically Signed   By: Nolon Nations M.D.   On: 04/12/2016 15:37       Discharge Exam: Vitals:   04/14/16 1200 04/14/16 1230  BP:  121/80  Pulse: 84 80  Resp: (!) 21 20  Temp:  97.9 F (36.6 C)   Vitals:   04/14/16 1023 04/14/16 1024 04/14/16 1200 04/14/16 1230  BP: 105/76 105/76  121/80  Pulse: 84 84 84 80  Resp: (!) 22 (!) 24 (!) 21 20  Temp:    97.9 F (36.6 C)  TempSrc:    Oral  SpO2: 92% 94% 95% 95%  Weight:      Height:        General: Pt is alert, awake, not in acute distress Cardiovascular: RRR, S1/S2 +, no rubs, no gallops Respiratory: CTA bilaterally, no wheezing, no rhonchi Abdominal: Soft, NT, ND, bowel sounds + Extremities: no edema, no cyanosis    The results of significant diagnostics from this hospitalization (including imaging, microbiology, ancillary and laboratory) are listed below for reference.     Microbiology: Recent Results (from the past 240 hour(s))  Culture, blood (Routine x 2)     Status: None (Preliminary result)   Collection Time: 04/12/16  3:05 PM  Result Value Ref Range Status   Specimen Description BLOOD LEFT HAND  Final   Special Requests BOTTLES DRAWN AEROBIC ONLY 10CC  Final   Culture NO GROWTH < 24 HOURS  Final   Report Status PENDING  Incomplete  MRSA PCR Screening     Status: None   Collection Time: 04/13/16  5:08 AM  Result Value Ref Range Status   MRSA by PCR NEGATIVE NEGATIVE Final    Comment:        The GeneXpert MRSA Assay (FDA approved for NASAL specimens only), is one component of a comprehensive MRSA colonization surveillance program. It is not intended to diagnose MRSA infection nor to guide or monitor treatment for MRSA infections.      Labs: BNP (last 3  results)  Recent Labs  08/27/15 1449  BNP 834.1*   Basic Metabolic Panel:  Recent Labs Lab 04/12/16 1454 04/12/16 1721 04/13/16 0332  NA 138 139 139  K 3.6 3.7 4.0  CL 99* 105 109  CO2 24 22 20*  GLUCOSE 150* 145* 172*  BUN 33* 33* 39*  CREATININE 6.68* 6.28* 7.14*  CALCIUM 8.4* 7.3* 7.5*   Liver Function Tests:  Recent Labs Lab 04/12/16 1454 04/12/16 1721 04/13/16 0332  AST 41 34 42*  ALT 33 28 36  ALKPHOS 64 53 59  BILITOT 0.7 0.6 0.8  PROT 6.7 5.7* 5.6*  ALBUMIN 3.6 3.0* 2.9*   No results for input(s): LIPASE, AMYLASE in the last 168 hours. No results for input(s): AMMONIA in the last 168 hours. CBC:  Recent Labs Lab 04/12/16 1454 04/12/16 1721 04/13/16 0332 04/14/16 0217  WBC 12.8* 10.7* 9.4 14.4*  NEUTROABS 10.2* 10.1*  --   --   HGB 11.7* 10.0* 10.0* 11.4*  HCT 36.2 31.1* 31.4* 35.2*  MCV 97.8 98.1 97.5 96.2  PLT 226 193 197 264   Cardiac Enzymes: No results for input(s): CKTOTAL, CKMB, CKMBINDEX, TROPONINI in the last 168 hours. BNP: Invalid input(s): POCBNP CBG:  Recent Labs Lab 04/13/16 1129 04/13/16 1752 04/13/16 2158 04/14/16 0822 04/14/16 1232  GLUCAP 146* 94 132* 88 102*   D-Dimer No results for input(s): DDIMER in the last 72 hours. Hgb A1c  Recent Labs  04/12/16 1721  HGBA1C 5.4   Lipid Profile No results for input(s): CHOL, HDL, LDLCALC, TRIG, CHOLHDL, LDLDIRECT in the last 72 hours. Thyroid function studies No results for input(s): TSH, T4TOTAL, T3FREE, THYROIDAB in the last 72 hours.  Invalid input(s): FREET3 Anemia work up No results for input(s): VITAMINB12, FOLATE, FERRITIN, TIBC, IRON, RETICCTPCT in the last 72 hours. Urinalysis    Component Value Date/Time   COLORURINE YELLOW 07/26/2013 1202   APPEARANCEUR CLEAR 07/26/2013 1202   LABSPEC 1.016 07/26/2013 1202   PHURINE 7.0 07/26/2013 1202   GLUCOSEU NEGATIVE 07/26/2013 1202   HGBUR SMALL (A) 07/26/2013 1202   BILIRUBINUR NEGATIVE 07/26/2013 1202    KETONESUR NEGATIVE 07/26/2013 1202   PROTEINUR 100 (A) 07/26/2013 1202   UROBILINOGEN 1.0 07/26/2013 1202   NITRITE NEGATIVE 07/26/2013 1202   LEUKOCYTESUR SMALL (A) 07/26/2013 1202   Sepsis Labs Invalid input(s): PROCALCITONIN,  WBC,  LACTICIDVEN Microbiology Recent Results (from the past 240 hour(s))  Culture, blood (Routine x 2)     Status: None (Preliminary result)   Collection Time: 04/12/16  3:05 PM  Result Value Ref Range Status   Specimen Description BLOOD LEFT HAND  Final   Special Requests BOTTLES DRAWN AEROBIC ONLY 10CC  Final   Culture NO GROWTH < 24 HOURS  Final   Report Status PENDING  Incomplete  MRSA PCR Screening     Status: None   Collection Time: 04/13/16  5:08 AM  Result Value Ref Range Status   MRSA by PCR NEGATIVE NEGATIVE Final    Comment:        The GeneXpert MRSA Assay (FDA approved for NASAL specimens only), is one component of a comprehensive MRSA colonization surveillance program. It is not intended to diagnose MRSA infection nor to guide or monitor treatment for MRSA infections.      Time coordinating discharge: Over 30 minutes  SIGNED:   Debbe Odea, MD  [facility] 04/14/2016, 1:22 PM Pager   If 7PM-7AM, please contact night-coverage [email or web address] Password [email or web address]

## 2016-04-14 NOTE — Evaluation (Signed)
Physical Therapy Evaluation Patient Details Name: [name] MRN: [id number] DOB: [birth year] Today's Date: 04/14/2016   History of Present Illness  Pt adm with acute bronchitis and respiratory failure. Pt also with high INR. PMH - ESRD on HD, DM, afib, lt reverse total shoulder, bil TKA,   Clinical Impression  Pt admitted with above diagnosis and presents to PT with functional limitations due to deficits listed below (See PT problem list). Pt needs skilled PT to maximize independence and safety to allow discharge to home with family. Pt doing well and expect will return to baseline once home with family. Will follow acutely but doubt will need PT after DC.     Follow Up Recommendations No PT follow up;Supervision for mobility/OOB    Equipment Recommendations  None recommended by PT    Recommendations for Other Services       Precautions / Restrictions Precautions Precautions: Fall Restrictions Weight Bearing Restrictions: No      Mobility  Bed Mobility               General bed mobility comments: Pt up in chair  Transfers Overall transfer level: Needs assistance Equipment used: Rolling walker (2 wheeled) Transfers: Sit to/from Stand Sit to Stand: Min guard         General transfer comment: For balance and safety  Ambulation/Gait Ambulation/Gait assistance: Min guard Ambulation Distance (Feet): 125 Feet Assistive device: Rolling walker (2 wheeled) Gait Pattern/deviations: Step-through pattern;Decreased stride length;Trunk flexed Gait velocity: decr Gait velocity interpretation: Below normal speed for age/gender General Gait Details: Assist for balance and safety. Amb on RA with SpO2 >95%  Stairs            Wheelchair Mobility    Modified Rankin (Stroke Patients Only)       Balance Overall balance assessment: Needs assistance Sitting-balance support: No upper extremity supported;Feet supported Sitting balance-Leahy Scale: Good     Standing  balance support: Bilateral upper extremity supported Standing balance-Leahy Scale: Poor Standing balance comment: walker and supervision for static standing                             Pertinent Vitals/Pain Pain Assessment: No/denies pain    Home Living Family/patient expects to be discharged to:: Private residence Living Arrangements: Children (son and daughter in law) Available Help at Discharge: Family;Available 24 hours/day Type of Home: Mobile home Home Access: Stairs to enter Entrance Stairs-Rails: None Entrance Stairs-Number of Steps: 1+1 Home Layout: One level Home Equipment: Walker - 4 wheels;Walker - 2 wheels;Wheelchair - [employer] (hemiwalker, lift chair)      Prior Function Level of Independence: Needs assistance   Gait / Transfers Assistance Needed: modified independent with rollator  ADL's / Homemaking Assistance Needed: sponge bathes, daughter in law cooks and [employer]   Dominant Hand: Right    Extremity/Trunk Assessment   Upper Extremity Assessment Upper Extremity Assessment: LUE deficits/detail LUE Deficits / Details: decr shoulder ROM and strength due to recent shoulder surgery    Lower Extremity Assessment Lower Extremity Assessment: Generalized weakness       Communication   Communication: HOH  Cognition Arousal/Alertness: Awake/alert Behavior During Therapy: WFL for tasks assessed/performed Overall Cognitive Status: Within Functional Limits for tasks assessed                      General Comments      Exercises  Assessment/Plan    PT Assessment Patient needs continued PT services  PT Problem List Decreased strength;Decreased activity tolerance;Decreased balance;Decreased mobility          PT Treatment Interventions DME instruction;Gait training;Functional mobility training;Therapeutic activities;Therapeutic exercise;Balance training;Patient/family education    PT Goals  (Current goals can be found in the Care Plan section)  Acute Rehab PT Goals Patient Stated Goal: return home PT Goal Formulation: With patient Time For Goal Achievement: 04/21/16 Potential to Achieve Goals: Good    Frequency Min 3X/week   Barriers to discharge        Co-evaluation               End of Session Equipment Utilized During Treatment: Gait belt Activity Tolerance: Patient tolerated treatment well Patient left: in chair;with call bell/phone within reach Nurse Communication: Mobility status         Time: 7218-2883 PT Time Calculation (min) (ACUTE ONLY): 12 min   Charges:   PT Evaluation $PT Eval Moderate Complexity: 1 Procedure     PT G CodesShary Decamp [place] 05/04/16, 12:09 PM [employer] PT [phone]

## 2016-04-14 NOTE — Progress Notes (Signed)
0200 Pt had second nose bleed of the night, 3 loose dark stools, and increased INR of 8.95. MD notified.

## 2016-04-17 LAB — CULTURE, BLOOD (ROUTINE X 2)
Culture: NO GROWTH
Culture: NO GROWTH

## 2016-05-03 ENCOUNTER — Ambulatory Visit (INDEPENDENT_AMBULATORY_CARE_PROVIDER_SITE_OTHER): Payer: Medicare Other | Admitting: Internal Medicine

## 2016-05-03 ENCOUNTER — Encounter: Payer: Self-pay | Admitting: Internal Medicine

## 2016-05-03 VITALS — BP 114/80 | HR 70 | Ht 66.0 in | Wt 198.0 lb

## 2016-05-03 DIAGNOSIS — J9601 Acute respiratory failure with hypoxia: Secondary | ICD-10-CM

## 2016-05-03 DIAGNOSIS — J45991 Cough variant asthma: Secondary | ICD-10-CM

## 2016-05-03 NOTE — Assessment & Plan Note (Signed)
Onset 05/2015  - Spirometry 09/01/2015  wnl off all rx   - max gerd rx 09/01/2015 >>>  - FENO  10/01/2015  = 87  - 10/01/2015  After extensive coaching HFA effectiveness =    75% > try dulera 100 2bid  - FENO 11/19/2015  =  28 on symbicort 80 - FENO 12/17/2015  =  62 off symb x 2 weeks   All goals of chronic asthma control met including optimal function and elimination of symptoms with minimal need for rescue therapy.  Contingencies discussed in full including contacting this office immediately if not controlling the symptoms using the rule of two's.      I had an extended discussion with the patient reviewing all relevant studies completed to date and  lasting 15 to 20 minutes of a 25 minute visit    Each maintenance medication was reviewed in detail including most importantly the difference between maintenance and prns and under what circumstances the prns are to be triggered using an action plan format that is not reflected in the computer generated alphabetically organized AVS.    Please see AVS for specific instructions unique to this visit that I personally wrote and verbalized to the the pt in detail and then reviewed with pt  by my nurse highlighting any  changes in therapy recommended at today's visit to their plan of care.

## 2016-05-03 NOTE — Assessment & Plan Note (Signed)
05/03/2016   Walked RA x one lap @ 185 stopped due to  Sob no desats   So resp failure has resolved, mostly sob due to obesity/ deconditioning > advised

## 2016-05-03 NOTE — Patient Instructions (Signed)
Continue Pantoprazole (protonix) 40 mg   Take  30-60 min before first meal of the day and Pepcid (famotidine)  20 mg one @  Bedtime   GERD (REFLUX)  is an extremely common cause of respiratory symptoms just like yours , many times with no obvious heartburn at all.    It can be treated with medication, but also with lifestyle changes including elevation of the head of your bed (ideally with 6 inch  bed blocks),  Smoking cessation, avoidance of late meals, excessive alcohol, and avoid fatty foods, chocolate, peppermint, colas, red wine, and acidic juices such as orange juice.  NO MINT OR MENTHOL PRODUCTS SO NO COUGH DROPS  USE SUGARLESS CANDY INSTEAD (Jolley ranchers or Stover's or Life Savers) or even ice chips will also do - the key is to swallow to prevent all throat clearing. NO OIL BASED VITAMINS - use powdered substitutes.     At the first sign of any increase in cough/ wheeze or need for albuterol restart the dulera 100  Take 2 puffs first thing in am and then another 2 puffs about 12 hours later.      If you are satisfied with your treatment plan,  let your doctor know and he/she can either refill your medications or you can return here when your prescription runs out.     If in any way you are not 100% satisfied,  please tell us.  If 100% better, tell your friends!  Pulmonary follow up is as needed

## 2016-05-03 NOTE — Progress Notes (Signed)
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 Pulmonary Consult    referred by Corliss Parish for wheezing, occ prod cough with yellow mucus, occ dyspnea x3 months.  denies any tightness, hemoptysis, f/c/s  indolent onset cough/wheeze x 3 months s assoc uri or nasal symptoms / no resp to saba and not changes related to pre HD or post HD days/nights  Most of the mucus is early in am and does wake up coughing / zpak /second abx x 10 by Dr Murrell Redden office > no better / no better saba / no relation to meals /swallowing/ not gagging at all  rec Prednisone 10 mg take  4 each am x 2 days,   2 each am x 2 days,  1 each am x 2 days and stop  Pantoprazole (protonix) 40 mg   Take  30-60 min before first meal of the day and Pepcid (famotidine)  20 mg one @  bedtime until return to office - this is the best way to tell whether stomach acid is contributing to your problem.  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 Follow-up    Cough had completely resolved, and then started back again approx 1 wk ago. Cough is prod with yellow sputum. She also c/o wheezing.   cough completely gone p prednisone after a few days then recurred p 3 weeks off it / esp severe in am's but does not typically wake her up  Wheezing some better with saba rx   rec Prednisone 10 mg take  4 each am x 2 days,   2 each am x 2 days,  1 each am x 2 days and stop    Dulera 100 Take 2 puffs first thing in am and then another 2 puffs about 12 hours later.  Work on inhaler technique: No change on protonix / pepcid for now    11/19/2015  f/u ov/[name] re: cough variant  Chief Complaint  Patient presents with  . Follow-up    cough had resolved until pt started using Symbicort last week- cough is prod with yellow mucus.   was doing better, was not needing much albuterol at all for the month July s any maint rx due to insurance issues  After started on symbicort 2bid  Stopped needing  the albuterol for sob No noct cough  No longer using gerd rx  rec Pantoprazole (protonix) 40 mg   Take  30-60 min before first meal of the day and Pepcid (famotidine)  20 mg one @  bedtime until return to office -   Prednisone 10 mg Take 4 for two days three for two days two for two days one for two days  If cough flares again you can try the dulera 100 2 every 12 hours or symbicort 80 in its place for  at least 2 weeks to see what benefit if any you notice  Only use your albuterol as a back up if cough/ wheeze/ short of breath    12/17/2015  f/u ov/[name] re: cough variant asthma  Off symbicort 80 2bid  X 2 weeks still maint on gerd rx  Chief Complaint  Patient presents with  . Follow-up    pt doing well, c/o prod cough with stringy yellow mucus X 2-3 wks.     rec Continue Pantoprazole (protonix) 40 mg   Take  30-60 min before first meal of the day and Pepcid (famotidine)  20 mg one @  bedtime until return to office - this is the best way to tell whether stomach acid is contributing to your problem.   At the first sign of any increase in cough/ wheeze or need for albuterol restart the symbicort 80 Take 2 puffs first thing in am and then another 2 puffs about 12 hours later.    Admit date: 04/12/2016 Discharge date: 04/14/2016    Discharge Diagnoses:  Principal Problem:   Acute bronchitis Active Problems:   Sepsis ([place])   Acute respiratory failure (HCC)   End stage  renal disease (HCC)   Diabetes mellitus type 2, diet-controlled (HCC)   Hypothyroidism   High cholesterol   Fever   Anemia due to vitamin B12 deficiency   Atrial fibrillation (HCC)    Subjective: Cough improving. No dyspnea or chest pain. Had nose bleed last night which has resolved.    Brief Summary: [name] a 76 y.o.femalewith medical history significant for extensive medical history listed below, including ESRD on HD MWF, last 04/12/15, atrial fibrillation on chronic Coumadin, diabetes, and hypotension. Patient was sent from the doctor's office to the emergency department for evaluation of chest congestion, cough, shortness of breath X 3 days and fever up to 102 with chills.   Hospital Course:  Principal Problem: Acute bronchitis, Acute respiratory failure - cough x 3 days with yellow sputum   - Z- pak started - Symptomatic treatment of cough - symptoms have been improving - Flu negative - has been weaned off of 4 L O 2 which she was requiring yesterday  Active Problems: Fever, leukocytosis, hypotension >> Sepsis - likely due to above however, as she has a dialysis cath, monitored for bacteremia -given Vanc and Azactam for this-  as blood cultures are negative, they have been d/c'd  Hypotension - chronic issue- cont Midodrine - On Solu-cortef QID started in ER- weaned off with stable BP  End stage renal disease ([place]) - Nephrology notified- underwent dialysis during hospital stay  Diabetes mellitus type 2, diet-controlled - started on SSI while here  Hypothyroidism - synthroid  Atrial fibrillation - INR elevated- Coumadin on hold- will need to recheck INR at dialysis again tomorrow- cont to hold Coumadin for now - not on rate controlling agent   05/03/2016  f/u ov/[name] re: cough variant asthma/ no on any maint rx  Chief Complaint  Patient presents with  . Follow-up    Pt admitted from 04/12/16 until 04/14/16 with bronchitis. She  states her breathing is about the same. She has occ cough with minimal yellow sputum.     this am felt good on awakening with very mini cough then as per usual rountine sob just when walks across the room with can to use the bathroom no clearly better on vs off dulera No dulera recently, did not event start during "bronchitis" flare leading to admit. Min  cough mostly just in am's / no need for saba   No obvious day to day or daytime variability or assoc mucus plugs or hemoptysis or cp or chest tightness, subjective wheeze or overt sinus or hb symptoms. No unusual exp hx or h/o childhood pna/ asthma or knowledge of premature birth.  Sleeping ok without nocturnal  or early am exacerbation  of respiratory  c/o's or need for noct saba. Also denies any obvious fluctuation of symptoms with weather or environmental changes or other aggravating or alleviating factors except as outlined above   Current Medications, Allergies, Complete Past Medical History, Past Surgical History, Family History, and Social History were reviewed in [employer] record.  ROS  The following are not active complaints unless bolded sore throat, dysphagia, dental problems, itching, sneezing,  nasal congestion or excess/ purulent secretions, ear ache,   fever, chills, sweats, unintended wt loss, classically pleuritic or exertional cp,  orthopnea pnd or leg swelling, presyncope, palpitations, abdominal pain, anorexia, nausea, vomiting, diarrhea  or change in bowel or bladder habits, change in stools or urine, dysuria,hematuria,  rash, arthralgias, visual complaints, headache, numbness, weakness or ataxia or problems with walking - uses cane at home or coordination,  change in mood/affect or memory.              Objective:  Physical Exam  Elderly wf sitting in w/c  Due to weak legs s/p bilateral  tkr   05/03/2016         197    12/17/2015        195  10/01/2015        189   09/01/15 187 lb 9.6 oz (85.095  kg)  08/26/15 191 lb (86.637 kg)  07/27/15 188 lb (85.276 kg)    Vital signs reviewed  - Note on arrival 02 sats  97% on RA   HEENT: edentulous/ nl  turbinates, and oropharynx. Nl external ear canals without cough reflex   NECK :  without JVD/Nodes/TM/ nl carotid upstrokes bilaterally   LUNGS: no acc muscle use,  Nl contour chest  Completely  clear bilaterally to A and P   CV:  RRR  no s3 or murmur or increase in P2, no edema   ABD:  soft and nontender with nl inspiratory excursion in the supine position. No bruits or organomegaly, bowel sounds nl  MS:  Nl gait/ ext warm without deformities, calf tenderness, cyanosis or clubbing No obvious joint restrictions   SKIN: warm and dry without lesions    NEURO:  alert, approp, nl sensorium with  no motor deficits        I personally reviewed images and agree with radiology impression as follows:  pCXR:  04/13/16 Mild cardiac enlargement.  No evidence of active pulmonary disease.         Assessment & Plan:

## 2016-05-03 NOTE — Assessment & Plan Note (Signed)
Body mass index is 31.95 kg/m.  Lab Results  Component Value Date   TSH 0.929 02/04/2015     Contributing to gerd tendency/ doe/reviewed the need and the process to achieve and maintain neg calorie balance > defer f/u primary care including intermittently monitoring thyroid status

## 2016-05-24 ENCOUNTER — Telehealth: Payer: Self-pay | Admitting: Physician Assistant

## 2016-05-24 NOTE — Telephone Encounter (Signed)
New Message     Daughter is calling, said someone from our office called I could find no notes in the chart where anyone called

## 2016-05-24 NOTE — Telephone Encounter (Signed)
Returned call to daughter in [employer] (ok per DPR)-states that Dr. Lorrene Reid her kidney doctor is requesting her have a echocardiogram.  States that she was told to contact patients PCP which she did.  PCP instructed daughter in law that they would fax over forms to Dr. Percival Spanish to review and order echo?    Advised I would route to primary to verify if paperwork was received.  If paperwork has been received, Dr. Percival Spanish will need to review and ok order for echo prior to scheduling.    Verbalized understanding.

## 2016-05-25 ENCOUNTER — Other Ambulatory Visit (HOSPITAL_COMMUNITY): Payer: Self-pay | Admitting: Adult Health Nurse Practitioner

## 2016-05-25 DIAGNOSIS — I4891 Unspecified atrial fibrillation: Secondary | ICD-10-CM

## 2016-05-26 ENCOUNTER — Ambulatory Visit (HOSPITAL_COMMUNITY)
Admission: RE | Admit: 2016-05-26 | Discharge: 2016-05-26 | Disposition: A | Discharge status: Home / Self Care | Payer: Medicare Other | Source: Ambulatory Visit | Attending: Adult Health Nurse Practitioner | Admitting: Adult Health Nurse Practitioner

## 2016-05-26 DIAGNOSIS — I058 Other rheumatic mitral valve diseases: Secondary | ICD-10-CM | POA: Diagnosis not present

## 2016-05-26 DIAGNOSIS — I4891 Unspecified atrial fibrillation: Secondary | ICD-10-CM | POA: Diagnosis not present

## 2016-05-26 DIAGNOSIS — I071 Rheumatic tricuspid insufficiency: Secondary | ICD-10-CM | POA: Insufficient documentation

## 2016-05-26 NOTE — Progress Notes (Signed)
*  PRELIMINARY RESULTS* Echocardiogram 2D Echocardiogram has been performed.  [name] 05/26/2016, 11:50 AM

## 2016-05-27 NOTE — Telephone Encounter (Signed)
Spoke to patient's daughter (DPR). Notes an echo performed this week, has not received a results call about this.  Her main concern was that BPs have been dropping when patient in dialysis - dialysis center had recommended the echo, which they could not order --- was ordered by PCP  She was under impression Dr. Percival Spanish would have reviewed this. Informed her results probably went back to PCP if ordered by their office, but I would be glad to let Dr. Percival Spanish know about this test and see if he can lend interpretation. She was receptive to this. Understands this will be followed up on next week.

## 2016-05-27 NOTE — Telephone Encounter (Signed)
New Message      Pt daughter is returning your call about the echo results

## 2016-05-28 NOTE — Telephone Encounter (Signed)
This study did not come to my in basket.  I do not see any high risk findings.  NL EF.  No significant valve disease.  She can schedule follow up with APP next week.

## 2016-05-31 NOTE — Telephone Encounter (Signed)
Spoke to patient, patient notes understanding of results.  She's agreeable to clinic visit but will have her daughter call back to set this up.

## 2016-06-07 ENCOUNTER — Other Ambulatory Visit (HOSPITAL_COMMUNITY): Payer: Self-pay | Admitting: Adult Health Nurse Practitioner

## 2016-06-07 DIAGNOSIS — W19XXXS Unspecified fall, sequela: Secondary | ICD-10-CM

## 2016-06-09 ENCOUNTER — Encounter (HOSPITAL_COMMUNITY): Payer: Self-pay

## 2016-06-09 ENCOUNTER — Ambulatory Visit (HOSPITAL_COMMUNITY)
Admission: RE | Admit: 2016-06-09 | Discharge: 2016-06-09 | Disposition: A | Discharge status: Home / Self Care | Payer: Medicare Other | Source: Ambulatory Visit | Attending: Adult Health Nurse Practitioner | Admitting: Adult Health Nurse Practitioner

## 2016-09-22 ENCOUNTER — Ambulatory Visit: Payer: Medicare Other | Admitting: Cardiology

## 2016-11-08 NOTE — Progress Notes (Signed)
Cardiology Office Note   Date:  11/09/2016   ID:  [name], DOB May 09, 1940, MRN [id number]  PCP:  Dione Housekeeper, MD  Cardiologist:   Minus Breeding, MD   Chief Complaint  Patient presents with  . Atrial Fibrillation      History of Present Illness: [name] is a 76 y.o. female who presents for follow-up of atrial fibrillation. She was found to have this when she was in the hospital for ligation of an arteriovenous fistula. She's been on anticoagulation. When she was seen earlier this year she had some bradycardic rates during dialysis.     She presents for follow up.  She is doing well.  The patient denies any new symptoms such as chest discomfort, neck or arm discomfort. There has been no new shortness of breath, PND or orthopnea. There have been no reported palpitations, presyncope or syncope. .    Past Medical History:  Diagnosis Date  . A-fib ([place])   . Anemia   . Arthritis   . Diabetes mellitus    "diet controlled"  . Diabetes mellitus without complication ([place])   . ESRD (end stage renal disease) ([place])    dialysis MWF  . GERD (gastroesophageal reflux disease)   . Gout   . History of blood transfusion   . HLD (hyperlipidemia)   . HOH (hard of hearing)    left ear  . Hypertension    hypotensive -since starting dialysis  . Hypothyroidism   . PAF (paroxysmal atrial fibrillation) ([place])    a. Echo 11/16:  Mild LVH, EF 55-60%, normal wall motion, MAC, mild MR, severe LAE (49 ml/m2), mild RVE, normal RVSF, mild RAE, mild TR, PASP 24 mmHg;  CHADS2-VASc: 4 >> Coumadin followed by PCP  . Renal disorder     Past Surgical History:  Procedure Laterality Date  . AV FISTULA PLACEMENT  04/03/2012   Procedure: ARTERIOVENOUS (AV) FISTULA CREATION;  Surgeon: Elam Dutch, MD;  Location: [facility];  Service: Vascular;  Laterality: Left;  creation left brachial cephalic fistula   . CARPAL TUNNEL RELEASE Left   . COLONOSCOPY W/ POLYPECTOMY    . DILATION AND CURETTAGE OF  UTERUS    . EYE SURGERY Bilateral    bilateral cataract removal  . Hemodialysis  catheter Right   . JOINT REPLACEMENT Bilateral    bilateral knee  . JOINT REPLACEMENT Right    shoulder  . LIGATION OF ARTERIOVENOUS  FISTULA Left 02/04/2015   Procedure: LIGATION OF BRACHIOCEPHALIC ARTERIOVENOUS  FISTULA;  Surgeon: Conrad Mabscott, MD;  Location: [facility];  Service: Vascular;  Laterality: Left;  . PORTACATH PLACEMENT    . REVERSE SHOULDER ARTHROPLASTY Left 01/22/2016   Procedure: LEFT REVERSE SHOULDER ARTHROPLASTY;  Surgeon: Netta Cedars, MD;  Location: [facility];  Service: Orthopedics;  Laterality: Left;  . SPLIT NIGHT STUDY  07/26/2015  . STERIOD INJECTION Right 01/22/2016   Procedure: RIGHT RING FINGER STEROID INJECTION;  Surgeon: Netta Cedars, MD;  Location: [facility];  Service: Orthopedics;  Laterality: Right;  . THROMBECTOMY BRACHIAL ARTERY Left 02/06/2015   Procedure: EVACUATION OF LEFT ARM HEMATOMA;  Surgeon: Angelia Mould, MD;  Location: [facility];  Service: Vascular;  Laterality: Left;  . TOTAL KNEE ARTHROPLASTY     right knee  . TUBAL LIGATION       Current Outpatient Prescriptions  Medication Sig Dispense Refill  . albuterol (PROVENTIL HFA;VENTOLIN HFA) 108 (90 Base) MCG/ACT inhaler Inhale 2 puffs into the lungs every 6 (  six) hours as needed for wheezing or shortness of breath.    . cinacalcet (SENSIPAR) 30 MG tablet Take 30 mg by mouth every other day.    . citalopram (CELEXA) 10 MG tablet Take 10 mg by mouth 2 (two) times daily.    . colchicine 0.6 MG tablet Take 1 tablet (0.6 mg total) by mouth every 3 (three) days. Dose needs to be decreased to twice a week in dialysis patient.    . Darbepoetin Alfa (ARANESP) 60 MCG/0.3ML SOSY injection Inject 0.3 mLs (60 mcg total) into the vein every Thursday with hemodialysis. (Patient taking differently: Inject 60 mcg into the vein every Wednesday with hemodialysis. )    . doxercalciferol (HECTOROL) 0.5 MCG capsule Take 0.5 mcg by mouth every  Monday, Wednesday, and Friday with hemodialysis. Every Mon / Wed / Fri  w/dialysis    . ezetimibe (ZETIA) 10 MG tablet Take 10 mg by mouth daily.     . Ferric Citrate (AURYXIA) 1 GM 210 MG(Fe) TABS Take 2 tablets by mouth 3 (three) times daily before meals.    . gabapentin (NEURONTIN) 100 MG capsule Take 100 mg by mouth at bedtime.     Marland Kitchen HYDROcodone-acetaminophen (NORCO) 5-325 MG tablet Take 1 tablet by mouth every 6 (six) hours as needed for moderate pain. 30 tablet 0  . levothyroxine (SYNTHROID, LEVOTHROID) 100 MCG tablet Take 100 mcg by mouth daily.      Marland Kitchen LORazepam (ATIVAN) 0.5 MG tablet TAKE ONE TABLET BY MOUTH EVERY 6 HOURS AS NEEDED FOR ANXIETY    . midodrine (PROAMATINE) 10 MG tablet Take 10 mg by mouth 3 (three) times daily.     . Mometasone Furo-Formoterol Fum (DULERA IN) Inhale 2 puffs into the lungs 2 (two) times daily.    . multivitamin (RENA-VIT) TABS tablet Take 1 tablet by mouth daily.    . pantoprazole (PROTONIX) 40 MG tablet Take 1 tablet (40 mg total) by mouth daily. Take 30-60 min before first meal of the day 30 tablet 11  . polyethylene glycol (MIRALAX / GLYCOLAX) packet Take 17 g by mouth daily. 14 each 0  . Vitamin D, Ergocalciferol, (DRISDOL) 50000 UNITS CAPS Take 50,000 Units by mouth every Monday.     . warfarin (COUMADIN) 5 MG tablet Take 5 mg by mouth as directed.    Marland Kitchen ACCU-CHEK AVIVA PLUS test strip      No current facility-administered medications for this visit.     Allergies:   Amoxicillin; Sulfa drugs cross reactors; Percocet [oxycodone-acetaminophen]; and Sulfa antibiotics    ROS:  Please see the history of present illness.   Otherwise, review of systems are positive for none.   All other systems are reviewed and negative.    PHYSICAL EXAM: VS:  BP 134/88   Pulse 74   Ht _0  (1.651 m)   Wt 198 lb (89.8 kg)   BMI 32.95 kg/m  , BMI Body mass index is 32.95 kg/m.  GENERAL:  Well appeari NECK:  No jugular venous distention, waveform within normal  limits, carotid upstroke brisk and symmetric, no bruits, no thyromegaly LUNGS:  Clear to auscultation bilaterally CHEST:  Unremarkable, dialysis catheter in place HEART:  PMI not displaced or sustained,S1 and S2 within normal limits, no S3, no S4, no clicks, no rubs, no murmurs ABD:  Flat, positive bowel sounds normal in frequency in pitch, no bruits, no rebound, no guarding, no midline pulsatile mass, no hepatomegaly, no splenomegaly EXT:  2 plus pulses throughout, no edema, no  cyanosis no clubbing SKIN:  No rashes no nodules  EKG:  EKG is ordered today.    Sinus rhythm, rate 82 , axis within normal limits, intervals within normal limits, no acute ST-T wave changes.   Recent Labs: 04/13/2016: ALT 36; BUN 39; Creatinine, Ser 7.14; Potassium 4.0; Sodium 139 04/14/2016: Hemoglobin 11.4; Platelets 264    Lipid Panel    Component Value Date/Time   CHOL 155 02/06/2015 0308   TRIG 111 02/06/2015 0308   HDL 58 02/06/2015 0308   CHOLHDL 2.7 02/06/2015 0308   VLDL 22 02/06/2015 0308   LDLCALC 75 02/06/2015 0308      Wt Readings from Last 3 Encounters:  11/09/16 198 lb (89.8 kg)  05/03/16 197 lb 15.6 oz (89.8 kg)  04/14/16 196 lb 12.8 oz (89.3 kg)      Other studies Reviewed: Additional studies/ records that were reviewed today include: None Review of the above records demonstrates:      ASSESSMENT AND PLAN:   PAF: CHADS2-VASc=4.  She tolerates warfarin.  She has had some slow rates but no symptomatic rapid rates.  No change in therapy is planned.    ESRD:    She tolerates dialysis with Midodrine.    MWF dialysis.   HTN: BP is stable.  Continue current therapy.   Current medicines are reviewed at length with the patient today.  The patient does not have concerns regarding medicines.  The following changes have been made:  None  Labs/ tests ordered today include:   None  Orders Placed This Encounter  Procedures  . EKG 12-Lead     Disposition:   FU with me in 12  months.     Signed, Minus Breeding, MD  11/09/2016 7:20 PM    [place]

## 2016-11-09 ENCOUNTER — Ambulatory Visit (INDEPENDENT_AMBULATORY_CARE_PROVIDER_SITE_OTHER): Payer: Medicare Other | Admitting: Cardiology

## 2016-11-09 ENCOUNTER — Encounter: Payer: Self-pay | Admitting: Cardiology

## 2016-11-09 VITALS — BP 134/88 | HR 74 | Ht 65.0 in | Wt 198.0 lb

## 2016-11-09 DIAGNOSIS — I480 Paroxysmal atrial fibrillation: Secondary | ICD-10-CM | POA: Diagnosis not present

## 2016-11-09 NOTE — Patient Instructions (Signed)
Medication Instructions:  The current medical regimen is effective;  continue present plan and medications.  Follow-Up: Follow up in 1 year with Dr. Hochrein in [place].  You will receive a letter in the mail 2 months before you are due.  Please call us when you receive this letter to schedule your follow up appointment.  If you need a refill on your cardiac medications before your next appointment, please call your pharmacy.  Thank you for choosing [facility]!!     

## 2016-11-28 ENCOUNTER — Ambulatory Visit (INDEPENDENT_AMBULATORY_CARE_PROVIDER_SITE_OTHER): Payer: Medicare Other | Admitting: Adult Health

## 2016-11-28 ENCOUNTER — Telehealth: Payer: Self-pay | Admitting: Adult Health

## 2016-11-28 ENCOUNTER — Encounter: Payer: Self-pay | Admitting: Adult Health

## 2016-11-28 DIAGNOSIS — J45991 Cough variant asthma: Secondary | ICD-10-CM

## 2016-11-28 DIAGNOSIS — J209 Acute bronchitis, unspecified: Secondary | ICD-10-CM

## 2016-11-28 MED ORDER — DOXYCYCLINE HYCLATE 100 MG PO TABS: 100.0000 mg | ORAL_TABLET | Freq: Two times a day (BID) | ORAL | 0 refills | Status: DC

## 2016-11-28 MED ORDER — MOMETASONE FURO-FORMOTEROL FUM 100-5 MCG/ACT IN AERO: 2.0000 | INHALATION_SPRAY | Freq: Two times a day (BID) | RESPIRATORY_TRACT | 5 refills | Status: DC

## 2016-11-28 MED ORDER — MOMETASONE FURO-FORMOTEROL FUM 200-5 MCG/ACT IN AERO: 2.0000 | INHALATION_SPRAY | Freq: Two times a day (BID) | RESPIRATORY_TRACT | 0 refills | Status: DC

## 2016-11-28 NOTE — Progress Notes (Signed)
_0  ID: [name], female    DOB: February 16, 1941, 76 y.o.   MRN: [id number]  Chief Complaint  Patient presents with  . Acute Visit    Cough    Referring provider: Dione Housekeeper, MD  HPI: 76 year old white female, former smoker followed for cough variant asthma End-stage renal disease on dialysis since 2016  TEST   Spirometry 09/01/2015  wnl off all rx   - max gerd rx 09/01/2015 >>>  - FENO  10/01/2015  = 87  - 10/01/2015  After extensive coaching HFA effectiveness =    75% > try dulera 100 2bid  - FENO 11/19/2015  =  28 on symbicort 80 - FENO 12/17/2015  =  62 off symb x 2 weeks   11/28/2016  Acute OV : Cough  Patient returns for a acute office visit. Patient complains of 2 weeks of increased cough, congestion with thick mucus. Has been off [facility] for couple of months does not feel breathing is doing as well off Dulera .  No chest pain, orthopnea, edema or fever.   Allergies  Allergen Reactions  . Amoxicillin Rash    Has patient had a PCN reaction causing immediate rash, facial/tongue/throat swelling, SOB or lightheadedness with hypotension:YES Has patient had a PCN reaction causing severe rash involving mucus membranes or skin necrosis: Yes Has patient had a PCN reaction that required hospitalization No Has patient had a PCN reaction occurring within the last 10 years: Yes If all of the above answers are "NO", then may proceed with Cephalosporin use.   . Sulfa Drugs Cross Reactors Hives and Itching  . Percocet [Oxycodone-Acetaminophen] Itching and Rash    Did not happen last time she took it  . Sulfa Antibiotics Hives    Immunization History  Administered Date(s) Administered  . Influenza Split 02/03/2015  . Influenza Whole 01/05/2015, 01/03/2016  . Pneumococcal Conjugate-13 03/10/2015, 04/05/2015  . Pneumococcal Polysaccharide-23 03/25/2014    Past Medical History:  Diagnosis Date  . A-fib ([place])   . Anemia   . Arthritis   . Diabetes mellitus    "diet  controlled"  . Diabetes mellitus without complication ([place])   . ESRD (end stage renal disease) ([place])    dialysis MWF  . GERD (gastroesophageal reflux disease)   . Gout   . History of blood transfusion   . HLD (hyperlipidemia)   . HOH (hard of hearing)    left ear  . Hypertension    hypotensive -since starting dialysis  . Hypothyroidism   . PAF (paroxysmal atrial fibrillation) ([place])    a. Echo 11/16:  Mild LVH, EF 55-60%, normal wall motion, MAC, mild MR, severe LAE (49 ml/m2), mild RVE, normal RVSF, mild RAE, mild TR, PASP 24 mmHg;  CHADS2-VASc: 4 >> Coumadin followed by PCP  . Renal disorder     Tobacco History: History  Smoking Status  . Former Smoker  . Packs/day: 0.25  . Years: 2.00  . Types: Cigarettes  . Quit date: 04/04/1998  Smokeless Tobacco  . Never Used   Counseling given: Not Answered   Outpatient Encounter Prescriptions as of 11/28/2016  Medication Sig  . ACCU-CHEK AVIVA PLUS test strip   . albuterol (PROVENTIL HFA;VENTOLIN HFA) 108 (90 Base) MCG/ACT inhaler Inhale 2 puffs into the lungs every 6 (six) hours as needed for wheezing or shortness of breath.  . cinacalcet (SENSIPAR) 30 MG tablet Take 30 mg by mouth every other day.  . citalopram (CELEXA) 10 MG tablet Take 10 mg by  mouth 2 (two) times daily.  . colchicine 0.6 MG tablet Take 1 tablet (0.6 mg total) by mouth every 3 (three) days. Dose needs to be decreased to twice a week in dialysis patient.  . Darbepoetin Alfa (ARANESP) 60 MCG/0.3ML SOSY injection Inject 0.3 mLs (60 mcg total) into the vein every Thursday with hemodialysis. (Patient taking differently: Inject 60 mcg into the vein every Wednesday with hemodialysis. )  . doxercalciferol (HECTOROL) 0.5 MCG capsule Take 0.5 mcg by mouth every Monday, Wednesday, and Friday with hemodialysis. Every Mon / Wed / Fri  w/dialysis  . ezetimibe (ZETIA) 10 MG tablet Take 10 mg by mouth daily.   . Ferric Citrate (AURYXIA) 1 GM 210 MG(Fe) TABS Take 2 tablets by mouth 3  (three) times daily before meals.  . gabapentin (NEURONTIN) 100 MG capsule Take 100 mg by mouth at bedtime.   Marland Kitchen HYDROcodone-acetaminophen (NORCO) 5-325 MG tablet Take 1 tablet by mouth every 6 (six) hours as needed for moderate pain.  Marland Kitchen levothyroxine (SYNTHROID, LEVOTHROID) 100 MCG tablet Take 100 mcg by mouth daily.    Marland Kitchen LORazepam (ATIVAN) 0.5 MG tablet TAKE ONE TABLET BY MOUTH EVERY 6 HOURS AS NEEDED FOR ANXIETY  . midodrine (PROAMATINE) 10 MG tablet Take 10 mg by mouth 3 (three) times daily.   . Mometasone Furo-Formoterol Fum (DULERA IN) Inhale 2 puffs into the lungs 2 (two) times daily.  . multivitamin (RENA-VIT) TABS tablet Take 1 tablet by mouth daily.  . pantoprazole (PROTONIX) 40 MG tablet Take 1 tablet (40 mg total) by mouth daily. Take 30-60 min before first meal of the day  . polyethylene glycol (MIRALAX / GLYCOLAX) packet Take 17 g by mouth daily.  . Vitamin D, Ergocalciferol, (DRISDOL) 50000 UNITS CAPS Take 50,000 Units by mouth every Monday.   . warfarin (COUMADIN) 5 MG tablet Take 5 mg by mouth as directed.   No facility-administered encounter medications on file as of 11/28/2016.      Review of Systems  Constitutional:   No  weight loss, night sweats,  Fevers, chills, fatigue, or  lassitude.  HEENT:   No headaches,  Difficulty swallowing,  Tooth/dental problems, or  Sore throat,                No sneezing, itching, ear ache,  +nasal congestion, post nasal drip,   CV:  No chest pain,  Orthopnea, PND, swelling in lower extremities, anasarca, dizziness, palpitations, syncope.   GI  No heartburn, indigestion, abdominal pain, nausea, vomiting, diarrhea, change in bowel habits, loss of appetite, bloody stools.   Resp:    No chest wall deformity  Skin: no rash or lesions.  GU: no dysuria, change in color of urine, no urgency or frequency.  No flank pain, no hematuria   MS:  No joint pain or swelling.  No decreased range of motion.  No back pain.    Physical Exam    GEN: A/Ox3; pleasant , NAD, elderly    HEENT:  [place]/AT,  EACs-clear, TMs-wnl, NOSE-clear, THROAT-clear, no lesions, no postnasal drip or exudate noted.   NECK:  Supple w/ fair ROM; no JVD; normal carotid impulses w/o bruits; no thyromegaly or nodules palpated; no lymphadenopathy.    RESP  Clear  P & A; w/o, wheezes/ rales/ or rhonchi. no accessory muscle use, no dullness to percussion  CARD:  RRR, no m/r/g, no peripheral edema, pulses intact, no cyanosis or clubbing.  GI:   Soft & nt; nml bowel sounds; no organomegaly or masses detected.  Musco: Warm bil, no deformities or joint swelling noted.   Neuro: alert, no focal deficits noted.    Skin: Warm, no lesions or rashes    Lab Results:   BNP Imaging: No results found.   Assessment & Plan:   No problem-specific Assessment & Plan notes found for this encounter.     Rexene Edison, NP 11/28/2016

## 2016-11-28 NOTE — Patient Instructions (Addendum)
Doxycycline 100mg  Twice daily  For 1 week, take w/ food, wear sunscreen .  Notify coumadin clinic that you are starting antibiotic .  Mucinex DM Twice daily  As needed  Cough/congestion  Restart Dulera 2 puffs Twice daily  . Rinse after use.  follow up Dr. Melvyn Novas  In 6-8 weeks and As needed   Please contact office for sooner follow up if symptoms do not improve or worsen or seek emergency care

## 2016-11-28 NOTE — Telephone Encounter (Signed)
Spoke with [facility]. They stated that the RX for doxycycline 100mg  did not go through. I looked at patient's chart and the original RX did not go through for some reason. Resend RX and stayed on phone with pharmacist to make sure they received it. They have the RX now. Nothing else needed at time of call.

## 2016-11-29 NOTE — Assessment & Plan Note (Signed)
Flare   Plan  . Patient Instructions  Doxycycline 100mg  Twice daily  For 1 week, take w/ food, wear sunscreen .  Notify coumadin clinic that you are starting antibiotic .  Mucinex DM Twice daily  As needed  Cough/congestion  Restart Dulera 2 puffs Twice daily  . Rinse after use.  follow up Dr. Melvyn Novas  In 6-8 weeks and As needed   Please contact office for sooner follow up if symptoms do not improve or worsen or seek emergency care

## 2016-11-29 NOTE — Assessment & Plan Note (Signed)
Mild flare with URI [place]  Patient Instructions  Doxycycline 100mg  Twice daily  For 1 week, take w/ food, wear sunscreen .  Notify coumadin clinic that you are starting antibiotic .  Mucinex DM Twice daily  As needed  Cough/congestion  Restart Dulera 2 puffs Twice daily  . Rinse after use.  follow up Dr. Melvyn Novas  In 6-8 weeks and As needed   Please contact office for sooner follow up if symptoms do not improve or worsen or seek emergency care

## 2017-01-09 ENCOUNTER — Encounter: Payer: Self-pay | Admitting: Internal Medicine

## 2017-01-09 ENCOUNTER — Ambulatory Visit (INDEPENDENT_AMBULATORY_CARE_PROVIDER_SITE_OTHER): Payer: Medicare Other | Admitting: Internal Medicine

## 2017-01-09 VITALS — BP 114/64 | HR 109 | Ht 62.0 in | Wt 194.4 lb

## 2017-01-09 DIAGNOSIS — J45991 Cough variant asthma: Secondary | ICD-10-CM

## 2017-01-09 MED ORDER — BUDESONIDE-FORMOTEROL FUMARATE 160-4.5 MCG/ACT IN AERO: 2.0000 | INHALATION_SPRAY | Freq: Two times a day (BID) | RESPIRATORY_TRACT | 0 refills | Status: DC

## 2017-01-09 MED ORDER — BUDESONIDE-FORMOTEROL FUMARATE 80-4.5 MCG/ACT IN AERO: 2.0000 | INHALATION_SPRAY | Freq: Two times a day (BID) | RESPIRATORY_TRACT | 12 refills | Status: DC

## 2017-01-09 NOTE — Patient Instructions (Signed)
Symbiocort 160 Take 2 puffs first thing in am and then another 2 puffs about 12 hours later.   If ok then fill the prescription for symbicort 80 Take 2 puffs first thing in am and then another 2 puffs about 12 hours later.    Work on inhaler technique:  relax and gently blow all the way out then take a nice smooth deep breath back in, triggering the inhaler at same time you start breathing in.  Hold for up to 5 seconds if you can. Blow out thru nose. Rinse and gargle with water when done   Only use your albuterol as a rescue medication to be used if you can't catch your breath by resting or doing a relaxed purse lip breathing pattern.  - The less you use it, the better it will work when you need it. - Ok to use up to 2 puffs  every 4 hours if you must but call for immediate appointment if use goes up over your usual need - Don't leave home without it !!  (think of it like the spare tire for your car)    . If you are satisfied with your treatment plan,  let your doctor know and he/she can either refill your medications or you can return here when your prescription runs out.     If in any way you are not 100% satisfied,  please tell us.  If 100% better, tell your friends!  Pulmonary follow up is as needed

## 2017-01-09 NOTE — Progress Notes (Signed)
Subjective:   Patient ID: [name], female    DOB: Dec 22, 1940     MRN: [id number]    Brief patient profile:  13 yowf quit smoking 2006 "cost too much" no obvious sequelae / ESRF on HD sinc 2016 and referred to pulmonary clinic 09/01/2015 by Dr Rolan Lipa for new onset cough/wheezing since Feb 2017 with  Spirometry 09/01/2015  wnl off all rx  And clinical hx c/w cough variant asthma vs UACS related to gerd     History of Present Illness  09/01/2015 1st [facility] office visit/ [facility]   Chief Complaint  Patient presents with  . Pulmonary Consult    referred by Corliss Parish for wheezing, occ prod cough with yellow mucus, occ dyspnea x3 months.  denies any tightness, hemoptysis, f/c/s  indolent onset cough/wheeze x 3 months s assoc uri or nasal symptoms / no resp to saba and not changes related to pre HD or post HD days/nights  Most of the mucus is early in am and does wake up coughing / zpak /second abx x 10 by Dr Murrell Redden office > no better / no better saba / no relation to meals /swallowing/ not gagging at all  rec Prednisone 10 mg take  4 each am x 2 days,   2 each am x 2 days,  1 each am x 2 days and stop  Pantoprazole (protonix) 40 mg   Take  30-60 min before first meal of the day and Pepcid (famotidine)  20 mg one @  bedtime until return to office - this is the best way to tell whether stomach acid is contributing to your problem.  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 Follow-up    Cough had completely resolved, and then started back again approx 1 wk ago. Cough is prod with yellow sputum. She also c/o wheezing.   cough completely gone p prednisone after a few days then recurred p 3 weeks off it / esp severe in am's but does not typically wake her up  Wheezing some better with saba rx   rec Prednisone 10 mg take  4 each am x 2 days,   2 each am x 2 days,  1 each am x 2 days and stop    Dulera 100 Take 2 puffs first thing in am and then another 2 puffs about 12 hours later.  Work on inhaler technique: No change on protonix / pepcid for now    11/19/2015  f/u ov/[facility]ert re: cough variant  Chief Complaint  Patient presents with  . Follow-up    cough had resolved until pt started using Symbicort last week- cough is prod with yellow mucus.   was doing better, was not needing much albuterol at all for the month July s any maint rx due to insurance issues  After started on symbicort 2bid  Stopped needing  the albuterol for sob No noct cough  No longer using gerd rx  rec Pantoprazole (protonix) 40 mg   Take  30-60 min before first meal of the day and Pepcid (famotidine)  20 mg one @  bedtime until return to office -   Prednisone 10 mg Take 4 for two days three for two days two for two days one for two days  If cough flares again you can try the dulera 100 2 every 12 hours or symbicort 80 in its place for  at least 2 weeks to see what benefit if any you notice  Only use your albuterol as a back up if cough/ wheeze/ short of breath    12/17/2015  f/u ov/Wert re: cough variant asthma  Off symbicort 80 2bid  X 2 weeks still maint on gerd rx  Chief Complaint  Patient presents with  . Follow-up    pt doing well, c/o prod cough with stringy yellow mucus X 2-3 wks.     rec Continue Pantoprazole (protonix) 40 mg   Take  30-60 min before first meal of the day and Pepcid (famotidine)  20 mg one @  bedtime until return to office - this is the best way to tell whether stomach acid is contributing to your problem.   At the first sign of any increase in cough/ wheeze or need for albuterol restart the symbicort 80 Take 2 puffs first thing in am and then another 2 puffs about 12 hours later.    Admit date: 04/12/2016 Discharge date: 04/14/2016    Discharge Diagnoses:  Principal Problem:   Acute bronchitis Active Problems:   Sepsis ([name])   Acute respiratory failure (H[facility])   End stage  renal disease ([facility])   Diabetes mellitus type 2, diet-controlled (H[facility])   Hypothyroidism   High cholesterol   Fever   Anemia due to vitamin B12 deficiency   Atrial fibrillation ([facility])    Subjective: Cough improving. No dyspnea or chest pain. Had nose bleed last night which has resolved.    Brief Summary: [name] a 76 y.o.femalewith medical history significant for extensive medical history listed below, including ESRD on HD MWF, last 04/12/15, atrial fibrillation on chronic Coumadin, diabetes, and hypotension. Patient was sent from the doctor's office to the emergency department for evaluation of chest congestion, cough, shortness of breath X 3 days and fever up to 102 with chills.   Hospital Course:  Principal Problem: Acute bronchitis, Acute respiratory failure - cough x 3 days with yellow sputum   - Z- pak started - Symptomatic treatment of cough - symptoms have been improving - Flu negative - has been weaned off of 4 L O 2 which she was requiring yesterday  Active Problems: Fever, leukocytosis, hypotension >> Sepsis - likely due to above however, as she has a dialysis cath, monitored for bacteremia -given Vanc and Azactam for this-  as blood cultures are negative, they have been d/c'd  Hypotension - chronic issue- cont Midodrine - On Solu-cortef QID started in ER- weaned off with stable BP  End stage renal disease ([place]) - Nephrology notified- underwent dialysis during hospital stay  Diabetes mellitus type 2, diet-controlled - started on SSI while here  Hypothyroidism - synthroid  Atrial fibrillation - INR elevated- Coumadin on hold- will need to recheck INR at dialysis again tomorrow- cont to hold Coumadin for now - not on rate controlling agent   05/03/2016  f/u ov/Wert re: cough variant asthma/ no on any maint rx  Chief Complaint  Patient presents with  . Follow-up    Pt admitted from 04/12/16 until 04/14/16 with bronchitis. She  states her breathing is about the same. She has occ cough with minimal yellow sputum.     this am felt good on awakening with very mini cough then as per usual rountine sob just when walks across the room with can to use the bathroom no clearly better on vs off dulera No dulera recently, did not event start during "bronchitis" flare leading to admit. Min  cough mostly just in am's / no need for saba  rec Continue Pantoprazole (protonix) 40 mg   Take  30-60 min before first meal of the day and Pepcid (famotidine)  20 mg one @  Bedtime  GERD At the first sign of any increase in cough/ wheeze or need for albuterol restart the dulera 100  Take 2 puffs first thing in am and then another 2 puffs about 12 hours later.       11/28/16  NP acute eval rec Doxycycline 100mg  Twice daily  For 1 week, take w/ food, wear sunscreen .  Notify coumadin clinic that you are starting antibiotic .  Mucinex DM Twice daily  As needed  Cough/congestion  Restart Dulera 2 puffs Twice daily  . Rinse after use.     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 Strength  2bid  Chief Complaint  Patient presents with  . Follow-up    Pt c/o prod. cough with yellow mucus. Denies any CP or SOB.   able to cross parking lot at slt incline today which is improvement over baseline  Ok at rest/ worse at hs but able to sleep at 45 degrees on couch. Cough worse when not taking dulera regularly More limited by legs than sob    No obvious day to day or daytime variability or assoc excess/ purulent sputum or mucus plugs or hemoptysis or cp or chest tightness, subjective wheeze or overt sinus or hb symptoms. No unusual exp hx or h/o childhood pna/ asthma or knowledge of premature birth.  Sleeping ok flat without nocturnal  or early am exacerbation  of respiratory  c/o's or need for noct saba. Also denies any obvious fluctuation of symptoms with weather or environmental changes or other  aggravating or alleviating factors except as outlined above   Current Allergies, Complete Past Medical History, Past Surgical History, Family History, and Social History were reviewed in [employer] record.  ROS  The following are not active complaints unless bolded Hoarseness, sore throat, dysphagia, dental problems, itching, sneezing,  nasal congestion or discharge of excess mucus or purulent secretions, ear ache,   fever, chills, sweats, unintended wt loss or wt gain, classically pleuritic or exertional cp,  orthopnea pnd or leg swelling, presyncope, palpitations, abdominal pain, anorexia, nausea, vomiting, diarrhea  or change in bowel habits or change in bladder habits, change in stools or change in urine, dysuria, hematuria,  rash, arthralgias, visual complaints, headache, numbness, weakness or ataxia or problems with walking or coordination,  change in mood/affect or memory.        Current Meds  Medication Sig  . ACCU-CHEK AVIVA PLUS test strip   . albuterol (PROVENTIL HFA;VENTOLIN HFA) 108 (90 Base) MCG/ACT inhaler Inhale 2 puffs into the lungs every 6 (six) hours as needed for wheezing or shortness of breath.  . cinacalcet (SENSIPAR) 30 MG tablet Take 30 mg by mouth every other day.  . citalopram (CELEXA) 10 MG tablet Take 10 mg by mouth 2 (two) times daily.  . colchicine 0.6 MG tablet Take 1 tablet (0.6 mg total) by mouth every 3 (three) days. Dose needs to be decreased to twice a week in dialysis patient.  Marland Kitchen doxercalciferol (HECTOROL) 0.5 MCG capsule Take 0.5 mcg by mouth every Monday, Wednesday, and Friday with hemodialysis. Every Mon / Wed / Fri  w/dialysis  . ezetimibe (ZETIA) 10 MG tablet Take 10 mg by mouth daily.   . Ferric  Citrate (AURYXIA) 1 GM 210 MG(Fe) TABS Take 2 tablets by mouth 3 (three) times daily before meals.  . gabapentin (NEURONTIN) 100 MG capsule Take 100 mg by mouth at bedtime.   Marland Kitchen HYDROcodone-acetaminophen (NORCO) 5-325 MG tablet Take 1  tablet by mouth every 6 (six) hours as needed for moderate pain.  Marland Kitchen levothyroxine (SYNTHROID, LEVOTHROID) 100 MCG tablet Take 100 mcg by mouth daily.    Marland Kitchen LORazepam (ATIVAN) 0.5 MG tablet TAKE ONE TABLET BY MOUTH EVERY 6 HOURS AS NEEDED FOR ANXIETY  . midodrine (PROAMATINE) 10 MG tablet Take 10 mg by mouth 3 (three) times daily.   . multivitamin (RENA-VIT) TABS tablet Take 1 tablet by mouth daily.  . Vitamin D, Ergocalciferol, (DRISDOL) 50000 UNITS CAPS Take 50,000 Units by mouth every Monday.   . warfarin (COUMADIN) 5 MG tablet Take 5 mg by mouth as directed.             Objective:  Physical Exam  Elderly wf sitting in w/c      01/09/2017         194  05/03/2016         197    12/17/2015        195  10/01/2015        189   09/01/15 187 lb 9.6 oz (85.095 kg)  08/26/15 191 lb (86.637 kg)  07/27/15 188 lb (85.276 kg)    Vital signs reviewed  - Note on arrival 02 sats  96% on RA   HEENT: edentulous/ nl  turbinates, and oropharynx. Nl external ear canals without cough reflex   NECK :  without JVD/Nodes/TM/ nl carotid upstrokes bilaterally   LUNGS: no acc muscle use,  Nl contour chest  Min insp and exp rhonchi bilaterally   CV:  RRR  no s3 or murmur or increase in P2, no edema   ABD:  soft and nontender with nl inspiratory excursion in the supine position. No bruits or organomegaly, bowel sounds nl  MS:    ext warm without deformities, calf tenderness, cyanosis or clubbing No obvious joint restrictions   SKIN: warm and dry without lesions    NEURO:  alert, approp, nl sensorium with  no motor deficits               Assessment & Plan:

## 2017-01-10 NOTE — Assessment & Plan Note (Signed)
Onset 05/2015  - Spirometry 09/01/2015  wnl off all rx   - max gerd rx 09/01/2015 >>>  - FENO  10/01/2015  = 87  - 10/01/2015   try dulera 100 2bid  - FENO 11/19/2015  =  28 on symbicort 80 - FENO 12/17/2015  =  62 off symb x 2 weeks   - 01/09/2017  After extensive coaching HFA effectiveness =    75% try symb 160 2bid x 2 weeks then 80 2bid   Having a mild flare in symptoms but note improvement in ex tol vs baseline and unimpressive exam so will challenge with the high dose of symb x 2 weeks then resume the lower strength   I had an extended discussion with the patient reviewing all relevant studies completed to date and  lasting 15 to 20 minutes of a 25 minute visit    Formulary restrictions will be an ongoing challenge for the forseable future and I would be happy to pick an alternative if the pt will first  provide me a list of them but pt  will need to return here for training for any new device that is required eg dpi vs hfa vs respimat.    In meantime we can always provide samples so the patient never runs out of any needed respiratory medications.   Pulmonary f/u can be prn if satisfied with the low strength dosing to control symptoms/ otherwise needs to return to repeat the feno before committing to indefinite high strength ICS in this setting    Each maintenance medication was reviewed in detail including most importantly the difference between maintenance and prns and under what circumstances the prns are to be triggered using an action plan format that is not reflected in the computer generated alphabetically organized AVS.    Please see AVS for specific instructions unique to this visit that I personally wrote and verbalized to the the pt in detail and then reviewed with pt  by my nurse highlighting any  changes in therapy recommended at today's visit to their plan of care.

## 2017-01-19 ENCOUNTER — Other Ambulatory Visit: Payer: Self-pay | Admitting: Internal Medicine

## 2017-01-19 ENCOUNTER — Other Ambulatory Visit: Payer: Self-pay | Admitting: Orthopedic Surgery

## 2017-01-19 DIAGNOSIS — R05 Cough: Secondary | ICD-10-CM

## 2017-01-19 DIAGNOSIS — R053 Chronic cough: Secondary | ICD-10-CM

## 2017-02-02 ENCOUNTER — Ambulatory Visit (HOSPITAL_BASED_OUTPATIENT_CLINIC_OR_DEPARTMENT_OTHER): Admission: RE | Admit: 2017-02-02 | Payer: Medicare Other | Source: Ambulatory Visit | Admitting: Orthopedic Surgery

## 2017-02-02 ENCOUNTER — Encounter (HOSPITAL_BASED_OUTPATIENT_CLINIC_OR_DEPARTMENT_OTHER): Admission: RE | Payer: Self-pay | Source: Ambulatory Visit

## 2017-02-02 SURGERY — CARPAL TUNNEL RELEASE
Anesthesia: LOCAL | Laterality: Right

## 2017-03-29 ENCOUNTER — Telehealth: Payer: Self-pay | Admitting: Cardiology

## 2017-03-29 NOTE — Telephone Encounter (Signed)
Pt c/o BP issue: STAT if pt c/o blurred vision, one-sided weakness or slurred speech  1. What are your last 5 BP readings?  Unknown pt daughter said its at home and when the rn call back she can get it from the dialysis center    2. Are you having any other symptoms (ex. Dizziness, headache, blurred vision, passed out)? dizziness  3. What is your BP issue? Low bp     dialysis center told daughter that pt bp is low

## 2017-03-29 NOTE — Telephone Encounter (Signed)
Spoke with pt dtr, the patient is having a drop in bp and pulse while at dialysis but can also happen when at home. She is having dizzy spells with these changes as well. The folks at dialysis told the patient to call us to be seen. Her pulse is getting as low as 42 bpm. She is not on any rate controlling medications. She is scheduled to see the APP in dr hochrein's absence. They will bring a l;ist of bp and pulse to that appointment.

## 2017-03-29 NOTE — Telephone Encounter (Signed)
Follow up    Patient daughter calling with concerns about HR. Please call   STAT if HR is under 50 or over 120 (normal HR is 60-100 beats per minute)  1) What is your heart rate? 80-->90  2) Do you have a log of your heart rate readings (document readings)? 12/23 HR was 42  3) Do you have any other symptoms? Dizziness, weakness

## 2017-04-03 NOTE — Progress Notes (Signed)
Cardiology Office Note   Date:  04/06/2017   ID:  [name], DOB September 17, 1940, MRN [id number]  PCP:  Dione Housekeeper, MD  Cardiologist:  Minus Breeding, MD Chief Complaint  Patient presents with  . Follow-up    Discuss BP and low HR.  Marland Kitchen Headache  . Hypotension     History of Present Illness: [name] is a 76 y.o. female who presents for ongoing assessment and management of atrial fibrillation. CHADS VASC Score of 4. History of AV fistula, status post ligation.    Other history to include anemia, diabetes (diet-controlled", end-stage renal disease with dialysis on Monday Wednesday Friday, and GERD.  She was last seen in the office by Dr. Percival Spanish on 11/09/2016.  On the office visit she was doing well tolerating Coumadin.  No cardiac complaints.  No changes were made in her indication regimen or testing planned.  She comes today with her daughter and daughter-in-law who are concerned about blood pressure dropping during dialysis as low as 77/56 and 82/49 with elevated HR's up to 120 bpm.    Past Medical History:  Diagnosis Date  . A-fib ([place])   . Anemia   . Arthritis   . Diabetes mellitus    "diet controlled"  . Diabetes mellitus without complication ([place])   . ESRD (end stage renal disease) ([place])    dialysis MWF  . GERD (gastroesophageal reflux disease)   . Gout   . History of blood transfusion   . HLD (hyperlipidemia)   . HOH (hard of hearing)    left ear  . Hypertension    hypotensive -since starting dialysis  . Hypothyroidism   . PAF (paroxysmal atrial fibrillation) ([place])    a. Echo 11/16:  Mild LVH, EF 55-60%, normal wall motion, MAC, mild MR, severe LAE (49 ml/m2), mild RVE, normal RVSF, mild RAE, mild TR, PASP 24 mmHg;  CHADS2-VASc: 4 >> Coumadin followed by PCP  . Renal disorder     Past Surgical History:  Procedure Laterality Date  . AV FISTULA PLACEMENT  04/03/2012   Procedure: ARTERIOVENOUS (AV) FISTULA CREATION;  Surgeon: Elam Dutch, MD;  Location: [place];  Service: Vascular;  Laterality: Left;  creation left brachial cephalic fistula   . CARPAL TUNNEL RELEASE Left   . COLONOSCOPY W/ POLYPECTOMY    . DILATION AND CURETTAGE OF UTERUS    . EYE SURGERY Bilateral    bilateral cataract removal  . Hemodialysis  catheter Right   . JOINT REPLACEMENT Bilateral    bilateral knee  . JOINT REPLACEMENT Right    shoulder  . LIGATION OF ARTERIOVENOUS  FISTULA Left 02/04/2015   Procedure: LIGATION OF BRACHIOCEPHALIC ARTERIOVENOUS  FISTULA;  Surgeon: Conrad Lafayette, MD;  Location: [facility] [facility];  Service: Vascular;  Laterality: Left;  . PORTACATH PLACEMENT    . REVERSE SHOULDER ARTHROPLASTY Left 01/22/2016   Procedure: LEFT REVERSE SHOULDER ARTHROPLASTY;  Surgeon: Netta Cedars, MD;  Location: [facility];  Service: Orthopedics;  Laterality: Left;  . SPLIT NIGHT STUDY  07/26/2015  . STERIOD INJECTION Right 01/22/2016   Procedure: RIGHT RING FINGER STEROID INJECTION;  Surgeon: Netta Cedars, MD;  Location: [facility];  Service: Orthopedics;  Laterality: Right;  . THROMBECTOMY BRACHIAL ARTERY Left 02/06/2015   Procedure: EVACUATION OF LEFT ARM HEMATOMA;  Surgeon: Angelia Mould, MD;  Location: [facility];  Service: Vascular;  Laterality: Left;  . TOTAL KNEE ARTHROPLASTY     right knee  . TUBAL LIGATION  Current Outpatient Medications  Medication Sig Dispense Refill  . ACCU-CHEK AVIVA PLUS test strip     . albuterol (PROVENTIL HFA;VENTOLIN HFA) 108 (90 Base) MCG/ACT inhaler Inhale 2 puffs into the lungs every 6 (six) hours as needed for wheezing or shortness of breath.    . budesonide-formoterol (SYMBICORT) 80-4.5 MCG/ACT inhaler Inhale 2 puffs into the lungs 2 (two) times daily. 1 Inhaler 12  . cinacalcet (SENSIPAR) 30 MG tablet Take 30 mg by mouth every other day.    . citalopram (CELEXA) 10 MG tablet Take 10 mg by mouth 2 (two) times daily.    . colchicine 0.6 MG tablet Take 1 tablet (0.6 mg total) by mouth every 3 (three) days. Dose needs to be decreased to twice a  week in dialysis patient.    . ezetimibe (ZETIA) 10 MG tablet Take 10 mg by mouth daily.     . Ferric Citrate (AURYXIA) 1 GM 210 MG(Fe) TABS Take 2 tablets by mouth 3 (three) times daily before meals.    Marland Kitchen HYDROcodone-acetaminophen (NORCO) 5-325 MG tablet Take 1 tablet by mouth every 6 (six) hours as needed for moderate pain. 30 tablet 0  . levothyroxine (SYNTHROID, LEVOTHROID) 100 MCG tablet Take 100 mcg by mouth daily.      Marland Kitchen LORazepam (ATIVAN) 0.5 MG tablet TAKE ONE TABLET BY MOUTH EVERY 6 HOURS AS NEEDED FOR ANXIETY    . midodrine (PROAMATINE) 10 MG tablet Take 10 mg by mouth 3 (three) times daily.     . multivitamin (RENA-VIT) TABS tablet Take 1 tablet by mouth daily.    . pantoprazole (PROTONIX) 40 MG tablet TAKE ONE TABLET BY MOUTH DAILY 30-60 MINUTES BEFORE FIRST MEAL OF THEDAY 30 tablet 2  . Vitamin D, Ergocalciferol, (DRISDOL) 50000 UNITS CAPS Take 50,000 Units by mouth every Monday.     . warfarin (COUMADIN) 5 MG tablet Take 5 mg by mouth as directed.    . gabapentin (NEURONTIN) 100 MG capsule Take 100 mg by mouth at bedtime.      No current facility-administered medications for this visit.     Allergies:   Amoxicillin; Sulfa drugs cross reactors; Percocet [oxycodone-acetaminophen]; and Sulfa antibiotics    Social History:  The patient  reports that she quit smoking about 19 years ago. Her smoking use included cigarettes. She has a 0.50 pack-year smoking history. she has never used smokeless tobacco. She reports that she does not drink alcohol or use drugs.   Family History:  The patient's family history includes Cancer in her brother and sister; Diabetes in her father and sister; Heart disease in her father; Hyperlipidemia in her daughter; Hypertension in her daughter and son; Lung cancer in her sister.    ROS: All other systems are reviewed and negative. Unless otherwise mentioned in H&P    PHYSICAL EXAM: VS:  BP 116/74   Pulse 72   Ht _0  (1.575 m)   Wt 202 lb (91.6 kg)    BMI 36.95 kg/m  , BMI Body mass index is 36.95 kg/m. GEN: Well nourished, well developed, in no acute distress Frail HEENT: normal  Neck: no JVD, carotid bruits, or masses Cardiac: RRR; no murmurs, rubs, or gallops,no edema  Respiratory:  clear to auscultation bilaterally, normal work of breathing GI: soft, nontender, nondistended, + BS MS: no deformity or atrophy Dialysis catheter in the upper right chest. Brace to the left lower leg.  Skin: warm and dry, no rash Neuro:  Strength and sensation are intact Psych: euthymic mood,  full affect   Recent Labs: 04/13/2016: ALT 36; BUN 39; Creatinine, Ser 7.14; Potassium 4.0; Sodium 139 04/14/2016: Hemoglobin 11.4; Platelets 264    Lipid Panel    Component Value Date/Time   CHOL 155 02/06/2015 0308   TRIG 111 02/06/2015 0308   HDL 58 02/06/2015 0308   CHOLHDL 2.7 02/06/2015 0308   VLDL 22 02/06/2015 0308   LDLCALC 75 02/06/2015 0308      Wt Readings from Last 3 Encounters:  04/06/17 202 lb (91.6 kg)  01/09/17 194 lb 6.4 oz (88.2 kg)  11/28/16 199 lb 3.2 oz (90.4 kg)      Other studies Reviewed: Echocardiogram 06/07/2016 Left ventricle: The cavity size was normal. Wall thickness was   increased in a pattern of mild LVH. Systolic function was normal.   The estimated ejection fraction was in the range of 60% to 65%.   Wall motion was normal; there were no regional wall motion   abnormalities. The study was not technically sufficient to allow   evaluation of LV diastolic dysfunction due to atrial   fibrillation. - Mitral valve: Calcified annulus. - Left atrium: The atrium was moderately dilated. - Right ventricle: Systolic function was mildly to moderately   reduced. - Tricuspid valve: There was mild regurgitation.   ASSESSMENT AND PLAN:  1.  Hypotension: Patient experiences significant hypotension during dialysis.  She continues on Midrin which she has to take during and after dialysis.  She is somewhat symptomatic  feeling lightheaded and dizzy.  Not on any antihypertensive medications on review of her list.  I have asked her to discuss this with the nephrologist who is following her to see if they can adjust her dry weight to higher possibly less fluid to reduce incident of hypotension but continue filtering.  Will defer to nephrology for their recommendations concerning this.  No changes in medication.  2.  Frailty: Daughters were concerned about leaving her alone while they run errands.  I have advised them to get a life alert for her that way if she needs help while they are out of the house for short period of time she will have assistance.  Area agreement with this.  3.  Atrial fibrillation: She will continue Coumadin therapy with adjustments per PT/INR protocol.  Regular rhythm on assessment.   Current medicines are reviewed at length with the patient today.    Labs/ tests ordered today include: None Phill Myron. [place] Pugh, ANP, AACC   04/06/2017 9:58 AM    [facility] [address], [place], [place] [address] Phone: [phone]; Fax: [phone]

## 2017-04-06 ENCOUNTER — Ambulatory Visit (INDEPENDENT_AMBULATORY_CARE_PROVIDER_SITE_OTHER): Payer: Medicare Other | Admitting: Adult Health

## 2017-04-06 ENCOUNTER — Encounter: Payer: Self-pay | Admitting: Adult Health

## 2017-04-06 VITALS — BP 116/74 | HR 72 | Ht 62.0 in | Wt 202.0 lb

## 2017-04-06 DIAGNOSIS — R532 Functional quadriplegia: Secondary | ICD-10-CM

## 2017-04-06 DIAGNOSIS — I953 Hypotension of hemodialysis: Secondary | ICD-10-CM

## 2017-04-06 DIAGNOSIS — I4891 Unspecified atrial fibrillation: Secondary | ICD-10-CM

## 2017-04-06 NOTE — Patient Instructions (Signed)
Medication Instructions:  NO CHANGES-Your physician recommends that you continue on your current medications as directed. Please refer to the Current Medication list given to you today.  If you need a refill on your cardiac medications before your next appointment, please call your pharmacy.  Special Instructions: FOLLOW UP WITH DIALYSIS  Follow-Up: Your physician wants you to follow-up in: [place], DNP  Thank you for choosing [facility] at [facility]!!

## 2017-04-18 ENCOUNTER — Other Ambulatory Visit: Payer: Self-pay | Admitting: Internal Medicine

## 2017-04-18 DIAGNOSIS — R05 Cough: Secondary | ICD-10-CM

## 2017-04-18 DIAGNOSIS — R053 Chronic cough: Secondary | ICD-10-CM

## 2017-05-05 DIAGNOSIS — J9600 Acute respiratory failure, unspecified whether with hypoxia or hypercapnia: Secondary | ICD-10-CM

## 2017-05-05 HISTORY — DX: Acute respiratory failure, unspecified whether with hypoxia or hypercapnia: J96.00

## 2017-05-21 ENCOUNTER — Encounter (HOSPITAL_COMMUNITY): Payer: Self-pay | Admitting: *Deleted

## 2017-05-21 ENCOUNTER — Inpatient Hospital Stay (HOSPITAL_COMMUNITY)
Admission: EM | Admit: 2017-05-21 | Discharge: 2017-05-28 | DRG: 871 | Disposition: A | Discharge status: Home Health | Payer: Medicare Other | Attending: Internal Medicine | Admitting: Internal Medicine

## 2017-05-21 ENCOUNTER — Emergency Department (HOSPITAL_COMMUNITY): Payer: Medicare Other

## 2017-05-21 ENCOUNTER — Other Ambulatory Visit: Payer: Self-pay

## 2017-05-21 ENCOUNTER — Inpatient Hospital Stay (HOSPITAL_COMMUNITY): Payer: Medicare Other

## 2017-05-21 DIAGNOSIS — E785 Hyperlipidemia, unspecified: Secondary | ICD-10-CM | POA: Diagnosis present

## 2017-05-21 DIAGNOSIS — N2581 Secondary hyperparathyroidism of renal origin: Secondary | ICD-10-CM | POA: Diagnosis present

## 2017-05-21 DIAGNOSIS — Z992 Dependence on renal dialysis: Secondary | ICD-10-CM | POA: Diagnosis not present

## 2017-05-21 DIAGNOSIS — I120 Hypertensive chronic kidney disease with stage 5 chronic kidney disease or end stage renal disease: Secondary | ICD-10-CM | POA: Diagnosis present

## 2017-05-21 DIAGNOSIS — Z7901 Long term (current) use of anticoagulants: Secondary | ICD-10-CM

## 2017-05-21 DIAGNOSIS — Z79890 Hormone replacement therapy (postmenopausal): Secondary | ICD-10-CM

## 2017-05-21 DIAGNOSIS — D638 Anemia in other chronic diseases classified elsewhere: Secondary | ICD-10-CM | POA: Diagnosis present

## 2017-05-21 DIAGNOSIS — A4102 Sepsis due to Methicillin resistant Staphylococcus aureus: Principal | ICD-10-CM | POA: Diagnosis present

## 2017-05-21 DIAGNOSIS — Z96651 Presence of right artificial knee joint: Secondary | ICD-10-CM | POA: Diagnosis present

## 2017-05-21 DIAGNOSIS — I480 Paroxysmal atrial fibrillation: Secondary | ICD-10-CM | POA: Diagnosis present

## 2017-05-21 DIAGNOSIS — J1008 Influenza due to other identified influenza virus with other specified pneumonia: Secondary | ICD-10-CM | POA: Diagnosis present

## 2017-05-21 DIAGNOSIS — Z6833 Body mass index (BMI) 33.0-33.9, adult: Secondary | ICD-10-CM | POA: Diagnosis not present

## 2017-05-21 DIAGNOSIS — Z79899 Other long term (current) drug therapy: Secondary | ICD-10-CM | POA: Diagnosis not present

## 2017-05-21 DIAGNOSIS — K219 Gastro-esophageal reflux disease without esophagitis: Secondary | ICD-10-CM | POA: Diagnosis present

## 2017-05-21 DIAGNOSIS — R000 Tachycardia, unspecified: Secondary | ICD-10-CM | POA: Diagnosis not present

## 2017-05-21 DIAGNOSIS — J15212 Pneumonia due to Methicillin resistant Staphylococcus aureus: Secondary | ICD-10-CM | POA: Diagnosis present

## 2017-05-21 DIAGNOSIS — R0602 Shortness of breath: Secondary | ICD-10-CM | POA: Diagnosis present

## 2017-05-21 DIAGNOSIS — Z87891 Personal history of nicotine dependence: Secondary | ICD-10-CM

## 2017-05-21 DIAGNOSIS — J9601 Acute respiratory failure with hypoxia: Secondary | ICD-10-CM | POA: Diagnosis present

## 2017-05-21 DIAGNOSIS — I9589 Other hypotension: Secondary | ICD-10-CM | POA: Diagnosis present

## 2017-05-21 DIAGNOSIS — D649 Anemia, unspecified: Secondary | ICD-10-CM | POA: Diagnosis present

## 2017-05-21 DIAGNOSIS — Z22322 Carrier or suspected carrier of Methicillin resistant Staphylococcus aureus: Secondary | ICD-10-CM | POA: Diagnosis not present

## 2017-05-21 DIAGNOSIS — A419 Sepsis, unspecified organism: Secondary | ICD-10-CM | POA: Diagnosis not present

## 2017-05-21 DIAGNOSIS — I482 Chronic atrial fibrillation: Secondary | ICD-10-CM | POA: Diagnosis present

## 2017-05-21 DIAGNOSIS — R791 Abnormal coagulation profile: Secondary | ICD-10-CM | POA: Diagnosis present

## 2017-05-21 DIAGNOSIS — N186 End stage renal disease: Secondary | ICD-10-CM

## 2017-05-21 DIAGNOSIS — E038 Other specified hypothyroidism: Secondary | ICD-10-CM | POA: Diagnosis not present

## 2017-05-21 DIAGNOSIS — E8889 Other specified metabolic disorders: Secondary | ICD-10-CM | POA: Diagnosis present

## 2017-05-21 DIAGNOSIS — Z833 Family history of diabetes mellitus: Secondary | ICD-10-CM

## 2017-05-21 DIAGNOSIS — E118 Type 2 diabetes mellitus with unspecified complications: Secondary | ICD-10-CM | POA: Diagnosis not present

## 2017-05-21 DIAGNOSIS — Z66 Do not resuscitate: Secondary | ICD-10-CM | POA: Diagnosis present

## 2017-05-21 DIAGNOSIS — J189 Pneumonia, unspecified organism: Secondary | ICD-10-CM | POA: Insufficient documentation

## 2017-05-21 DIAGNOSIS — I4891 Unspecified atrial fibrillation: Secondary | ICD-10-CM | POA: Diagnosis present

## 2017-05-21 DIAGNOSIS — I361 Nonrheumatic tricuspid (valve) insufficiency: Secondary | ICD-10-CM | POA: Diagnosis not present

## 2017-05-21 DIAGNOSIS — E039 Hypothyroidism, unspecified: Secondary | ICD-10-CM | POA: Diagnosis present

## 2017-05-21 DIAGNOSIS — I253 Aneurysm of heart: Secondary | ICD-10-CM | POA: Diagnosis present

## 2017-05-21 DIAGNOSIS — Z7951 Long term (current) use of inhaled steroids: Secondary | ICD-10-CM | POA: Diagnosis not present

## 2017-05-21 DIAGNOSIS — E1122 Type 2 diabetes mellitus with diabetic chronic kidney disease: Secondary | ICD-10-CM | POA: Diagnosis present

## 2017-05-21 DIAGNOSIS — Z96612 Presence of left artificial shoulder joint: Secondary | ICD-10-CM | POA: Diagnosis present

## 2017-05-21 DIAGNOSIS — R7881 Bacteremia: Secondary | ICD-10-CM | POA: Diagnosis not present

## 2017-05-21 HISTORY — DX: Acute respiratory failure, unspecified whether with hypoxia or hypercapnia: J96.00

## 2017-05-21 LAB — BLOOD GAS, VENOUS

## 2017-05-21 LAB — COMPREHENSIVE METABOLIC PANEL
ALT: 20 U/L (ref 14–54)
AST: 24 U/L (ref 15–41)
Albumin: 3.1 g/dL — ABNORMAL LOW (ref 3.5–5.0)
Alkaline Phosphatase: 103 U/L (ref 38–126)
Anion gap: 18 — ABNORMAL HIGH (ref 5–15)
BUN: 54 mg/dL — ABNORMAL HIGH (ref 6–20)
CO2: 22 mmol/L (ref 22–32)
Calcium: 6.7 mg/dL — ABNORMAL LOW (ref 8.9–10.3)
Chloride: 98 mmol/L — ABNORMAL LOW (ref 101–111)
Creatinine, Ser: 7.8 mg/dL — ABNORMAL HIGH (ref 0.44–1.00)
GFR calc Af Amer: 5 mL/min — ABNORMAL LOW (ref >60)
GFR calc non Af Amer: 4 mL/min — ABNORMAL LOW (ref >60)
Glucose, Bld: 128 mg/dL — ABNORMAL HIGH (ref 65–99)
Potassium: 4 mmol/L (ref 3.5–5.1)
Sodium: 138 mmol/L (ref 135–145)
Total Bilirubin: 1 mg/dL (ref 0.3–1.2)
Total Protein: 6 g/dL — ABNORMAL LOW (ref 6.5–8.1)

## 2017-05-21 LAB — I-STAT VENOUS BLOOD GAS, ED
Acid-base deficit: 1 mmol/L (ref 0.0–2.0)
Bicarbonate: 26 mmol/L (ref 20.0–28.0)
O2 Saturation: 40 %
TCO2: 27 mmol/L (ref 22–32)
pCO2, Ven: 49 mmHg (ref 44.0–60.0)
pH, Ven: 7.332 (ref 7.250–7.430)
pO2, Ven: 25 mmHg — CL (ref 32.0–45.0)

## 2017-05-21 LAB — PROTIME-INR
INR: 4.43
Prothrombin Time: 41.9 seconds — ABNORMAL HIGH (ref 11.4–15.2)

## 2017-05-21 LAB — I-STAT TROPONIN, ED: Troponin i, poc: 0.02 ng/mL (ref 0.00–0.08)

## 2017-05-21 LAB — CBC WITH DIFFERENTIAL/PLATELET
Basophils Absolute: 0 10*3/uL (ref 0.0–0.1)
Basophils Relative: 0 %
Eosinophils Absolute: 0 10*3/uL (ref 0.0–0.7)
Eosinophils Relative: 0 %
HCT: 42.6 % (ref 36.0–46.0)
Hemoglobin: 13.6 g/dL (ref 12.0–15.0)
Lymphocytes Relative: 5 %
Lymphs Abs: 1 10*3/uL (ref 0.7–4.0)
MCH: 31.5 pg (ref 26.0–34.0)
MCHC: 31.9 g/dL (ref 30.0–36.0)
MCV: 98.6 fL (ref 78.0–100.0)
Monocytes Absolute: 1.3 10*3/uL — ABNORMAL HIGH (ref 0.1–1.0)
Monocytes Relative: 7 %
Neutro Abs: 17.3 10*3/uL — ABNORMAL HIGH (ref 1.7–7.7)
Neutrophils Relative %: 88 %
Platelets: 216 10*3/uL (ref 150–400)
RBC: 4.32 MIL/uL (ref 3.87–5.11)
RDW: 16.5 % — ABNORMAL HIGH (ref 11.5–15.5)
WBC: 19.6 10*3/uL — ABNORMAL HIGH (ref 4.0–10.5)

## 2017-05-21 LAB — BRAIN NATRIURETIC PEPTIDE: B Natriuretic Peptide: 667.9 pg/mL — ABNORMAL HIGH (ref 0.0–100.0)

## 2017-05-21 LAB — TROPONIN I: Troponin I: 0.03 ng/mL (ref <0.03)

## 2017-05-21 LAB — I-STAT CG4 LACTIC ACID, ED: Lactic Acid, Venous: 1.75 mmol/L (ref 0.5–1.9)

## 2017-05-21 LAB — TSH: TSH: 0.773 u[IU]/mL (ref 0.350–4.500)

## 2017-05-21 MED ORDER — ALBUTEROL SULFATE (2.5 MG/3ML) 0.083% IN NEBU
2.5000 mg | INHALATION_SOLUTION | RESPIRATORY_TRACT | Status: DC | PRN
  Administered 2017-05-22: 2.5 mg via RESPIRATORY_TRACT

## 2017-05-21 MED ORDER — IOPAMIDOL (ISOVUE-300) INJECTION 61%
INTRAVENOUS | Status: AC
  Filled 2017-05-21: qty 75

## 2017-05-21 MED ORDER — DEXTROSE 5 % IV SOLN
500.0000 mg | Freq: Two times a day (BID) | INTRAVENOUS | Status: DC
  Administered 2017-05-22: 500 mg via INTRAVENOUS
  Filled 2017-05-21 (×4): qty 0.5

## 2017-05-21 MED ORDER — EZETIMIBE 10 MG PO TABS
10.0000 mg | ORAL_TABLET | Freq: Every day | ORAL | Status: DC
  Administered 2017-05-21 – 2017-05-28 (×8): 10 mg via ORAL
  Filled 2017-05-21 (×8): qty 1

## 2017-05-21 MED ORDER — INSULIN ASPART 100 UNIT/ML ~~LOC~~ SOLN
0.0000 [IU] | Freq: Three times a day (TID) | SUBCUTANEOUS | Status: DC
  Administered 2017-05-23 (×2): 1 [IU] via SUBCUTANEOUS
  Administered 2017-05-24: 2 [IU] via SUBCUTANEOUS

## 2017-05-21 MED ORDER — IPRATROPIUM BROMIDE 0.02 % IN SOLN
0.5000 mg | RESPIRATORY_TRACT | Status: DC
  Administered 2017-05-21 – 2017-05-22 (×4): 0.5 mg via RESPIRATORY_TRACT
  Filled 2017-05-21 (×5): qty 2.5

## 2017-05-21 MED ORDER — WARFARIN - PHARMACIST DOSING INPATIENT: Freq: Every day | Status: DC

## 2017-05-21 MED ORDER — VANCOMYCIN HCL IN DEXTROSE 1-5 GM/200ML-% IV SOLN
1000.0000 mg | Freq: Once | INTRAVENOUS | Status: AC
  Administered 2017-05-21: 1000 mg via INTRAVENOUS
  Filled 2017-05-21: qty 200

## 2017-05-21 MED ORDER — HYDROCODONE-ACETAMINOPHEN 5-325 MG PO TABS
1.0000 | ORAL_TABLET | Freq: Four times a day (QID) | ORAL | Status: DC | PRN
  Administered 2017-05-22 – 2017-05-24 (×4): 1 via ORAL
  Filled 2017-05-21 (×5): qty 1

## 2017-05-21 MED ORDER — PANTOPRAZOLE SODIUM 40 MG PO TBEC
40.0000 mg | DELAYED_RELEASE_TABLET | Freq: Every day | ORAL | Status: DC
  Administered 2017-05-22 – 2017-05-28 (×7): 40 mg via ORAL
  Filled 2017-05-21 (×7): qty 1

## 2017-05-21 MED ORDER — LEVOTHYROXINE SODIUM 100 MCG PO TABS
100.0000 ug | ORAL_TABLET | Freq: Every day | ORAL | Status: DC
  Administered 2017-05-23 – 2017-05-28 (×6): 100 ug via ORAL
  Filled 2017-05-21 (×7): qty 1

## 2017-05-21 MED ORDER — CITALOPRAM HYDROBROMIDE 10 MG PO TABS
10.0000 mg | ORAL_TABLET | Freq: Two times a day (BID) | ORAL | Status: DC
  Administered 2017-05-21 – 2017-05-28 (×13): 10 mg via ORAL
  Filled 2017-05-21 (×13): qty 1

## 2017-05-21 MED ORDER — RENA-VITE PO TABS
1.0000 | ORAL_TABLET | Freq: Every day | ORAL | Status: DC
  Administered 2017-05-22 – 2017-05-28 (×7): 1 via ORAL
  Filled 2017-05-21 (×7): qty 1

## 2017-05-21 MED ORDER — IPRATROPIUM-ALBUTEROL 0.5-2.5 (3) MG/3ML IN SOLN
3.0000 mL | Freq: Once | RESPIRATORY_TRACT | Status: AC
  Administered 2017-05-21: 3 mL via RESPIRATORY_TRACT
  Filled 2017-05-21: qty 3

## 2017-05-21 MED ORDER — FERRIC CITRATE 1 GM 210 MG(FE) PO TABS
420.0000 mg | ORAL_TABLET | Freq: Three times a day (TID) | ORAL | Status: DC
  Administered 2017-05-22 – 2017-05-28 (×14): 420 mg via ORAL
  Filled 2017-05-21 (×23): qty 2

## 2017-05-21 MED ORDER — BUDESONIDE 0.25 MG/2ML IN SUSP
0.2500 mg | Freq: Two times a day (BID) | RESPIRATORY_TRACT | Status: DC
  Administered 2017-05-21 – 2017-05-28 (×13): 0.25 mg via RESPIRATORY_TRACT
  Filled 2017-05-21 (×17): qty 2

## 2017-05-21 MED ORDER — IOPAMIDOL (ISOVUE-370) INJECTION 76%
INTRAVENOUS | Status: AC
  Administered 2017-05-21: 100 mL via INTRAVENOUS
  Filled 2017-05-21: qty 100

## 2017-05-21 MED ORDER — LORAZEPAM 0.5 MG PO TABS: 0.5000 mg | ORAL_TABLET | Freq: Four times a day (QID) | ORAL | Status: DC | PRN

## 2017-05-21 MED ORDER — ONDANSETRON HCL 4 MG/2ML IJ SOLN: 4.0000 mg | Freq: Four times a day (QID) | INTRAMUSCULAR | Status: DC | PRN

## 2017-05-21 MED ORDER — ONDANSETRON HCL 4 MG PO TABS: 4.0000 mg | ORAL_TABLET | Freq: Four times a day (QID) | ORAL | Status: DC | PRN

## 2017-05-21 MED ORDER — SODIUM CHLORIDE 0.9 % IV SOLN
2.0000 g | Freq: Once | INTRAVENOUS | Status: AC
  Administered 2017-05-21: 2 g via INTRAVENOUS
  Filled 2017-05-21: qty 2

## 2017-05-21 MED ORDER — GABAPENTIN 100 MG PO CAPS
100.0000 mg | ORAL_CAPSULE | Freq: Every day | ORAL | Status: DC
  Administered 2017-05-21 – 2017-05-27 (×7): 100 mg via ORAL
  Filled 2017-05-21 (×7): qty 1

## 2017-05-21 MED ORDER — ALBUTEROL SULFATE (2.5 MG/3ML) 0.083% IN NEBU
2.5000 mg | INHALATION_SOLUTION | RESPIRATORY_TRACT | Status: DC
  Administered 2017-05-21 – 2017-05-22 (×4): 2.5 mg via RESPIRATORY_TRACT
  Filled 2017-05-21 (×5): qty 3

## 2017-05-21 MED ORDER — CINACALCET HCL 30 MG PO TABS
30.0000 mg | ORAL_TABLET | ORAL | Status: DC
  Administered 2017-05-21 – 2017-05-23 (×2): 30 mg via ORAL
  Filled 2017-05-21 (×3): qty 1

## 2017-05-21 MED ORDER — MIDODRINE HCL 5 MG PO TABS
10.0000 mg | ORAL_TABLET | Freq: Three times a day (TID) | ORAL | Status: DC
  Administered 2017-05-22 – 2017-05-23 (×4): 10 mg via ORAL
  Filled 2017-05-21 (×6): qty 2

## 2017-05-21 NOTE — ED Notes (Signed)
Patient transported to CT 

## 2017-05-21 NOTE — ED Notes (Signed)
Pt's 02 sats 88% on RA.  Pt placed back on [place] at 3LPM

## 2017-05-21 NOTE — H&P (Signed)
History and Physical    [name] JSE:[id number] DOB: June 29, 1940 DOA: 05/21/2017  PCP: Dione Housekeeper, MD  Patient coming from: Home.  Chief Complaint: Shortness of breath.  HPI: [name] is a 77 y.o. female with history of ESRD on hemodialysis, atrial fibrillation, hypothyroidism, diabetes mellitus was brought to the ER after patient was having persistent shortness of breath.  Patient also has been having right-sided chest pain.  Patient was started on antibiotics and prednisone on February 11 by patient's primary care physician.  Patient also was treated on Tamiflu for 5 days.  Despite taking which patient was still having shortness of breath with some nonproductive cough.  Denies any fever chills.  Last 2 days patient started developing right-sided pleuritic type of chest pain.  ED Course: In the ER patient was having low normal blood pressure tachycardic with blood work showing leukocytosis.  Chest x-ray was showing lung atelectasis.  Patient was started on empiric antibiotics for pneumonia.  Admitted for further management.  Review of Systems: As per HPI, rest all negative.   Past Medical History:  Diagnosis Date  . A-fib ([place])   . Anemia   . Arthritis   . Diabetes mellitus    "diet controlled"  . Diabetes mellitus without complication ([place])   . ESRD (end stage renal disease) ([place])    dialysis MWF  . GERD (gastroesophageal reflux disease)   . Gout   . History of blood transfusion   . HLD (hyperlipidemia)   . HOH (hard of hearing)    left ear  . Hypertension    hypotensive -since starting dialysis  . Hypothyroidism   . PAF (paroxysmal atrial fibrillation) ([place])    a. Echo 11/16:  Mild LVH, EF 55-60%, normal wall motion, MAC, mild MR, severe LAE (49 ml/m2), mild RVE, normal RVSF, mild RAE, mild TR, PASP 24 mmHg;  CHADS2-VASc: 4 >> Coumadin followed by PCP  . Renal disorder     Past Surgical History:  Procedure Laterality Date  . AV FISTULA PLACEMENT  04/03/2012   Procedure: ARTERIOVENOUS (AV) FISTULA CREATION;  Surgeon: Elam Dutch, MD;  Location: [facility];  Service: Vascular;  Laterality: Left;  creation left brachial cephalic fistula   . CARPAL TUNNEL RELEASE Left   . COLONOSCOPY W/ POLYPECTOMY    . DILATION AND CURETTAGE OF UTERUS    . EYE SURGERY Bilateral    bilateral cataract removal  . Hemodialysis  catheter Right   . JOINT REPLACEMENT Bilateral    bilateral knee  . JOINT REPLACEMENT Right    shoulder  . LIGATION OF ARTERIOVENOUS  FISTULA Left 02/04/2015   Procedure: LIGATION OF BRACHIOCEPHALIC ARTERIOVENOUS  FISTULA;  Surgeon: Conrad Collyer, MD;  Location: [facility];  Service: Vascular;  Laterality: Left;  . PORTACATH PLACEMENT    . REVERSE SHOULDER ARTHROPLASTY Left 01/22/2016   Procedure: LEFT REVERSE SHOULDER ARTHROPLASTY;  Surgeon: Netta Cedars, MD;  Location: [place];  Service: Orthopedics;  Laterality: Left;  . SPLIT NIGHT STUDY  07/26/2015  . STERIOD INJECTION Right 01/22/2016   Procedure: RIGHT RING FINGER STEROID INJECTION;  Surgeon: Netta Cedars, MD;  Location: [facility];  Service: Orthopedics;  Laterality: Right;  . THROMBECTOMY BRACHIAL ARTERY Left 02/06/2015   Procedure: EVACUATION OF LEFT ARM HEMATOMA;  Surgeon: Angelia Mould, MD;  Location: [place];  Service: Vascular;  Laterality: Left;  . TOTAL KNEE ARTHROPLASTY     right knee  . TUBAL LIGATION       reports that  she quit smoking about 19 years ago. Her smoking use included cigarettes. She has a 0.50 pack-year smoking history. she has never used smokeless tobacco. She reports that she does not drink alcohol or use drugs.  Allergies  Allergen Reactions  . Amoxicillin Rash    Has patient had a PCN reaction causing immediate rash, facial/tongue/throat swelling, SOB or lightheadedness with hypotension:YES Has patient had a PCN reaction causing severe rash involving mucus membranes or skin necrosis: Yes Has patient had a PCN reaction that required hospitalization No Has patient  had a PCN reaction occurring within the last 10 years: Yes If all of the above answers are "NO", then may proceed with Cephalosporin use.   . Sulfa Drugs Cross Reactors Hives and Itching  . Percocet [Oxycodone-Acetaminophen] Itching and Rash    Did not happen last time she took it  . Sulfa Antibiotics Hives    Family History  Problem Relation Age of Onset  . Diabetes Father        before age 41  . Heart disease Father   . Diabetes Sister   . Cancer Brother   . Hyperlipidemia Daughter   . Hypertension Daughter   . Hypertension Son   . Lung cancer Sister   . Cancer Sister     Prior to Admission medications   Medication Sig Start Date End Date Taking? Authorizing Provider  ACCU-CHEK AVIVA PLUS test strip  11/11/14  Yes [provider]  albuterol (PROVENTIL HFA;VENTOLIN HFA) 108 (90 Base) MCG/ACT inhaler Inhale 2 puffs into the lungs every 6 (six) hours as needed for wheezing or shortness of breath.   Yes [provider]  budesonide-formoterol (SYMBICORT) 80-4.5 MCG/ACT inhaler Inhale 2 puffs into the lungs 2 (two) times daily. 01/09/17  Yes Tanda Rockers, MD  cinacalcet (SENSIPAR) 30 MG tablet Take 30 mg by mouth every other day.   Yes [provider]  citalopram (CELEXA) 10 MG tablet Take 10 mg by mouth 2 (two) times daily.   Yes [provider]  colchicine 0.6 MG tablet Take 1 tablet (0.6 mg total) by mouth every 3 (three) days. Dose needs to be decreased to twice a week in dialysis patient. 04/14/16  Yes Debbe Odea, MD  ezetimibe (ZETIA) 10 MG tablet Take 10 mg by mouth daily.    Yes [provider]  Ferric Citrate (AURYXIA) 1 GM 210 MG(Fe) TABS Take 2 tablets by mouth 3 (three) times daily before meals.   Yes [provider]  gabapentin (NEURONTIN) 100 MG capsule Take 100 mg by mouth at bedtime.  12/22/14 05/21/17 Yes [provider]  HYDROcodone-acetaminophen (NORCO) 5-325 MG tablet Take 1 tablet by mouth every 6 (six)  hours as needed for moderate pain. 01/22/16  Yes Netta Cedars, MD  levofloxacin (LEVAQUIN) 500 MG tablet Take 500 mg by mouth daily. For 10 days. Started 05-15-17 05/15/17  Yes [provider]  levothyroxine (SYNTHROID, LEVOTHROID) 100 MCG tablet Take 100 mcg by mouth daily.     Yes [provider]  LORazepam (ATIVAN) 0.5 MG tablet TAKE ONE TABLET BY MOUTH EVERY 6 HOURS AS NEEDED FOR ANXIETY 09/01/16  Yes [provider]  midodrine (PROAMATINE) 10 MG tablet Take 10 mg by mouth 3 (three) times daily.    Yes [provider]  multivitamin (RENA-VIT) TABS tablet Take 1 tablet by mouth daily.   Yes [provider]  oseltamivir (TAMIFLU) 75 MG capsule Take 75 mg by mouth daily. Finished 05-20-17. For 5 days  Yes [provider]  pantoprazole (PROTONIX) 40 MG tablet TAKE ONE TABLET BY MOUTH EVERY DAY 30-60MINUTES BEFORE FIRST MEAL OF THE DAY 04/18/17  Yes Tanda Rockers, MD  predniSONE (DELTASONE) 20 MG tablet Take 20 mg by mouth 2 (two) times daily. For 5 days. Finished 05-20-17 05/15/17  Yes [provider]  Vitamin D, Ergocalciferol, (DRISDOL) 50000 UNITS CAPS Take 50,000 Units by mouth every Monday.    Yes [provider]  warfarin (COUMADIN) 5 MG tablet Take 5-7.5 mg by mouth as directed. 5 mg on all days except on Wednesday pt takes 7.5 mg   Yes [provider]    Physical Exam: Vitals:   05/21/17 1945 05/21/17 2000 05/21/17 2030 05/21/17 2100  BP: 97/68 101/70 105/69 93/70  Pulse: (!) 119 (!) 119 (!) 116 (!) 122  Resp: (!) 22 19 (!) 25 (!) 23  Temp:      TempSrc:      SpO2: 94% 92% 93% 91%  Weight:      Height:          Constitutional: Moderately built and nourished. Vitals:   05/21/17 1945 05/21/17 2000 05/21/17 2030 05/21/17 2100  BP: 97/68 101/70 105/69 93/70  Pulse: (!) 119 (!) 119 (!) 116 (!) 122  Resp: (!) 22 19 (!) 25 (!) 23  Temp:      TempSrc:      SpO2: 94% 92% 93% 91%  Weight:      Height:        Eyes: Anicteric no pallor. ENMT: No discharge from the ears eyes nose or mouth. Neck: No mass felt.  No neck rigidity.  No JVD appreciated. Respiratory: No rhonchi or crepitations. Cardiovascular: S1-S2 heard no murmurs appreciated. Abdomen: Soft nontender bowel sounds present. Musculoskeletal: No edema.  No joint effusion. Skin: No rash.  Skin appears warm. Neurologic: Alert awake oriented to time place and person.  Moves all extremities. Psychiatric: Appears normal.  Normal affect.   Labs on Admission: I have personally reviewed following labs and imaging studies  CBC: Recent Labs  Lab 05/21/17 1750  WBC 19.6*  NEUTROABS 17.3*  HGB 13.6  HCT 42.6  MCV 98.6  PLT 149   Basic Metabolic Panel: Recent Labs  Lab 05/21/17 1750  NA 138  K 4.0  CL 98*  CO2 22  GLUCOSE 128*  BUN 54*  CREATININE 7.80*  CALCIUM 6.7*   GFR: Estimated Creatinine Clearance: 6.7 mL/min (A) (by C-G formula based on SCr of 7.8 mg/dL (H)). Liver Function Tests: Recent Labs  Lab 05/21/17 1750  AST 24  ALT 20  ALKPHOS 103  BILITOT 1.0  PROT 6.0*  ALBUMIN 3.1*   No results for input(s): LIPASE, AMYLASE in the last 168 hours. No results for input(s): AMMONIA in the last 168 hours. Coagulation Profile: Recent Labs  Lab 05/21/17 1750  INR 4.43*   Cardiac Enzymes: No results for input(s): CKTOTAL, CKMB, CKMBINDEX, TROPONINI in the last 168 hours. BNP (last 3 results) No results for input(s): PROBNP in the last 8760 hours. HbA1C: No results for input(s): HGBA1C in the last 72 hours. CBG: No results for input(s): GLUCAP in the last 168 hours. Lipid Profile: No results for input(s): CHOL, HDL, LDLCALC, TRIG, CHOLHDL, LDLDIRECT in the last 72 hours. Thyroid Function Tests: No results for input(s): TSH, T4TOTAL, FREET4, T3FREE, THYROIDAB in the last 72 hours. Anemia Panel: No results for input(s): VITAMINB12, FOLATE, FERRITIN, TIBC, IRON, RETICCTPCT in the last 72 hours. Urine  analysis:  Component Value Date/Time   COLORURINE YELLOW 07/26/2013 1202   APPEARANCEUR CLEAR 07/26/2013 1202   LABSPEC 1.016 07/26/2013 1202   PHURINE 7.0 07/26/2013 1202   GLUCOSEU NEGATIVE 07/26/2013 1202   HGBUR SMALL (A) 07/26/2013 1202   BILIRUBINUR NEGATIVE 07/26/2013 1202   KETONESUR NEGATIVE 07/26/2013 1202   PROTEINUR 100 (A) 07/26/2013 1202   UROBILINOGEN 1.0 07/26/2013 1202   NITRITE NEGATIVE 07/26/2013 1202   LEUKOCYTESUR SMALL (A) 07/26/2013 1202   Sepsis Labs: _0 (procalcitonin:4,lacticidven:4) )No results found for this or any previous visit (from the past 240 hour(s)).   Radiological Exams on Admission: Dg Chest Port 1 View  Result Date: 05/21/2017 CLINICAL DATA:  Cough EXAM: PORTABLE CHEST 1 VIEW COMPARISON:  04/13/2016 chest radiograph. FINDINGS: Bilateral total shoulder arthroplasty is partially visualized. Right internal jugular central venous catheter terminates at the cavoatrial junction. Stable cardiomediastinal silhouette with mild cardiomegaly. No pneumothorax. No pleural effusion. No pulmonary edema. Low lung volumes with mild bibasilar atelectasis. IMPRESSION: Low lung volumes with mild bibasilar atelectasis. Stable cardiomegaly without pulmonary edema. Electronically Signed   By: Ilona Sorrel M.D.   On: 05/21/2017 19:20    EKG: Independently reviewed.  Sinus tachycardia.  Assessment/Plan Principal Problem:   Acute respiratory failure with hypoxia (HCC) Active Problems:   Hypothyroidism   Anemia   Atrial fibrillation ([place])    1. Acute respiratory failure with hypoxia likely from pneumonia -patient has been placed on empiric antibiotics for healthcare associated pneumonia.  Follow cultures.  Since patient has pleuritic type of chest pain have ordered a CT scan of the chest. 2. ESRD on hemodialysis on Monday Wednesday Friday -patient states she has not missed her dialysis.  Please consult nephrology for dialysis in a.m. 3. History of atrial  fibrillation -on Coumadin which will be dosed per pharmacy.  INR is supratherapeutic. 4. Hypothyroidism on Synthroid. 5. History of chronic hypotension on Midrin. 6. History of diabetes mellitus per the chart patient will be on sliding scale coverage.   DVT prophylaxis: Coumadin. Code Status: DNR. Family Communication: Patient's daughter. Disposition Plan: Home. Consults called: None. Admission status: Inpatient.   Rise Patience MD [facility] Pager [phone].  If 7PM-7AM, please contact night-coverage [email or web address] Password [email or web address]  05/21/2017, 9:38 PM

## 2017-05-21 NOTE — ED Notes (Signed)
Pt last dialyzed Friday alert no distress

## 2017-05-21 NOTE — ED Provider Notes (Addendum)
Pt has + flu and she is noted to be in resp distress with tachycardia and tachypnea. O2 sats on room air is 88%. Questionable sepsis. CXR is pending. Pt is already on Levaquin at home for COPD exacerbation.  We will start broad spectrum antibiotics. Blood cultures ordered - however, nursing team notified that they are to start the antibiotics by 7 pm if they are unsuccessful with cultures.  Pt is DNR/DNI.   CRITICAL CARE Performed by: Deven Furia   Total critical care time: 34 minutes  Critical care time was exclusive of separately billable procedures and treating other patients.  Critical care was necessary to treat or prevent imminent or life-threatening deterioration.  Critical care was time spent personally by me on the following activities: development of treatment plan with patient and/or surrogate as well as nursing, discussions with consultants, evaluation of patient's response to treatment, examination of patient, obtaining history from patient or surrogate, ordering and performing treatments and interventions, ordering and review of laboratory studies, ordering and review of radiographic studies, pulse oximetry and re-evaluation of patient's condition.    Varney Biles, MD 05/21/17 0315    Varney Biles, MD 05/21/17 1840

## 2017-05-21 NOTE — ED Triage Notes (Signed)
The pt arrived by [place] with productive cough for 3-4 days.  The pt tested pos for the flu on Monday  She has been taking tamiflu.. She has been c.o rt sided chest wall pain.  deoneb given on the way here. sats initially was 88-91  Dialysis pt rt upper chest dialysis cath.  Pt lives with family  cbg 134 t 100.2  No iv

## 2017-05-21 NOTE — ED Provider Notes (Signed)
[facility] EMERGENCY DEPARTMENT Provider Note   CSN: [id number] Arrival date & time: 05/21/17  1644     History   Chief Complaint Chief Complaint  Patient presents with  . Shortness of Breath    HPI [name] is a 77 y.o. female.  HPI  77 year old female history of atrial fibrillation/anticoagulation Coumadin, diabetes, end-stage renal disease dialysis Monday Wednesdays Fridays, hypertension, hyperlipidemia, morbid obesity has been recently evaluated by family medicine physician on February 11 with respiratory symptoms concerning for COPD exacerbation/infectious etiology was sent out with 10-day regimen of Levaquin along with 5-day burst of steroids.  Patient presents today with persistent shortness of breath/dyspnea and new onset right-sided anterior chest wall pain inferior to right breast that is reproducible.  Patient denies any recent trauma/falls and has been compliant with her anticoagulation.  Patient is still compliant with her antibiotics/steroids.  Patient was found by EMS with a room air O2 89% subsequently given DuoNeb.   Past Medical History:  Diagnosis Date  . A-fib ([place])   . Anemia   . Arthritis   . Diabetes mellitus    "diet controlled"  . Diabetes mellitus without complication ([place])   . ESRD (end stage renal disease) ([place])    dialysis MWF  . GERD (gastroesophageal reflux disease)   . Gout   . History of blood transfusion   . HLD (hyperlipidemia)   . HOH (hard of hearing)    left ear  . Hypertension    hypotensive -since starting dialysis  . Hypothyroidism   . PAF (paroxysmal atrial fibrillation) ([place])    a. Echo 11/16:  Mild LVH, EF 55-60%, normal wall motion, MAC, mild MR, severe LAE (49 ml/m2), mild RVE, normal RVSF, mild RAE, mild TR, PASP 24 mmHg;  CHADS2-VASc: 4 >> Coumadin followed by PCP  . Renal disorder     Patient Active Problem List   Diagnosis Date Noted  . Morbid obesity due to excess calories ([place]) 05/03/2016  .  Acute respiratory failure with hypoxia ([place]) 04/14/2016  . Acute bronchitis 04/13/2016  . Fever 04/12/2016  . Sepsis ([place]) 04/12/2016  . Diabetes mellitus with complication ([place])   . Idiopathic hypotension   . Gastroesophageal reflux disease without esophagitis   . 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 Cough variant asthma vs UACS  09/01/2015  . Anemia due to vitamin B12 deficiency 04/21/2015  . Anxiety, generalized 04/21/2015  . Atrial fibrillation ([place]) 02/16/2015  . Dyslipidemia   . Anemia   . New onset a-fib ([place]) 02/04/2015  . Diabetes mellitus type 2, diet-controlled ([place])   . Hypothyroidism   . High cholesterol   . Steal syndrome dialysis vascular access ([place]) 12/03/2014  . Physical deconditioning 08/04/2013  . Acute on chronic renal failure ([employer]) 08/04/2013  . Hyperkalemia 07/26/2013  . CKD (chronic kidney disease) stage 5, GFR less than 15 ml/min (HCC) 07/26/2013  . End stage renal disease ([place]) 03/15/2012    Past Surgical History:  Procedure Laterality Date  . AV FISTULA PLACEMENT  04/03/2012   Procedure: ARTERIOVENOUS (AV) FISTULA CREATION;  Surgeon: Elam Dutch, MD;  Location: [facility];  Service: Vascular;  Laterality: Left;  creation left brachial cephalic fistula   . CARPAL TUNNEL RELEASE Left   . COLONOSCOPY W/ POLYPECTOMY    . DILATION AND CURETTAGE OF UTERUS    . EYE SURGERY Bilateral    bilateral cataract removal  . Hemodialysis  catheter Right   . JOINT REPLACEMENT Bilateral    bilateral  knee  . JOINT REPLACEMENT Right    shoulder  . LIGATION OF ARTERIOVENOUS  FISTULA Left 02/04/2015   Procedure: LIGATION OF BRACHIOCEPHALIC ARTERIOVENOUS  FISTULA;  Surgeon: Conrad Dauphin, MD;  Location: [facility];  Service: Vascular;  Laterality: Left;  . PORTACATH PLACEMENT    . REVERSE SHOULDER ARTHROPLASTY Left 01/22/2016   Procedure: LEFT REVERSE SHOULDER ARTHROPLASTY;  Surgeon: Netta Cedars, MD;  Location: [facility];  Service: Orthopedics;  Laterality: Left;  .  SPLIT NIGHT STUDY  07/26/2015  . STERIOD INJECTION Right 01/22/2016   Procedure: RIGHT RING FINGER STEROID INJECTION;  Surgeon: Netta Cedars, MD;  Location: [facility];  Service: Orthopedics;  Laterality: Right;  . THROMBECTOMY BRACHIAL ARTERY Left 02/06/2015   Procedure: EVACUATION OF LEFT ARM HEMATOMA;  Surgeon: Angelia Mould, MD;  Location: [facility];  Service: Vascular;  Laterality: Left;  . TOTAL KNEE ARTHROPLASTY     right knee  . TUBAL LIGATION      OB History    Gravida Para Term Preterm AB Living   0 0 0 0 0     SAB TAB Ectopic Multiple Live Births   0 0 0           Home Medications    Prior to Admission medications   Medication Sig Start Date End Date Taking? Authorizing Provider  ACCU-CHEK AVIVA PLUS test strip  11/11/14  Yes [provider]  albuterol (PROVENTIL HFA;VENTOLIN HFA) 108 (90 Base) MCG/ACT inhaler Inhale 2 puffs into the lungs every 6 (six) hours as needed for wheezing or shortness of breath.   Yes [provider]  budesonide-formoterol (SYMBICORT) 80-4.5 MCG/ACT inhaler Inhale 2 puffs into the lungs 2 (two) times daily. 01/09/17  Yes Tanda Rockers, MD  cinacalcet (SENSIPAR) 30 MG tablet Take 30 mg by mouth every other day.   Yes [provider]  citalopram (CELEXA) 10 MG tablet Take 10 mg by mouth 2 (two) times daily.   Yes [provider]  colchicine 0.6 MG tablet Take 1 tablet (0.6 mg total) by mouth every 3 (three) days. Dose needs to be decreased to twice a week in dialysis patient. 04/14/16  Yes Debbe Odea, MD  ezetimibe (ZETIA) 10 MG tablet Take 10 mg by mouth daily.    Yes [provider]  Ferric Citrate (AURYXIA) 1 GM 210 MG(Fe) TABS Take 2 tablets by mouth 3 (three) times daily before meals.   Yes [provider]  gabapentin (NEURONTIN) 100 MG capsule Take 100 mg by mouth at bedtime.  12/22/14 05/21/17 Yes [provider]  HYDROcodone-acetaminophen (NORCO) 5-325 MG tablet Take 1 tablet by  mouth every 6 (six) hours as needed for moderate pain. 01/22/16  Yes Netta Cedars, MD  levofloxacin (LEVAQUIN) 500 MG tablet Take 500 mg by mouth daily. For 10 days. Started 05-15-17 05/15/17  Yes [provider]  levothyroxine (SYNTHROID, LEVOTHROID) 100 MCG tablet Take 100 mcg by mouth daily.     Yes [provider]  LORazepam (ATIVAN) 0.5 MG tablet TAKE ONE TABLET BY MOUTH EVERY 6 HOURS AS NEEDED FOR ANXIETY 09/01/16  Yes [provider]  midodrine (PROAMATINE) 10 MG tablet Take 10 mg by mouth 3 (three) times daily.    Yes [provider]  multivitamin (RENA-VIT) TABS tablet Take 1 tablet by mouth daily.   Yes [provider]  oseltamivir (TAMIFLU) 75 MG capsule Take 75 mg by mouth daily. Finished 05-20-17. For 5 days   Yes [provider]  pantoprazole (  PROTONIX) 40 MG tablet TAKE ONE TABLET BY MOUTH EVERY DAY 30-60MINUTES BEFORE FIRST MEAL OF THE DAY 04/18/17  Yes Tanda Rockers, MD  predniSONE (DELTASONE) 20 MG tablet Take 20 mg by mouth 2 (two) times daily. For 5 days. Finished 05-20-17 05/15/17  Yes [provider]  Vitamin D, Ergocalciferol, (DRISDOL) 50000 UNITS CAPS Take 50,000 Units by mouth every Monday.    Yes [provider]  warfarin (COUMADIN) 5 MG tablet Take 5-7.5 mg by mouth as directed. 5 mg on all days except on Wednesday pt takes 7.5 mg   Yes [provider]    Family History Family History  Problem Relation Age of Onset  . Diabetes Father        before age 50  . Heart disease Father   . Diabetes Sister   . Cancer Brother   . Hyperlipidemia Daughter   . Hypertension Daughter   . Hypertension Son   . Lung cancer Sister   . Cancer Sister     Social History Social History   Tobacco Use  . Smoking status: Former Smoker    Packs/day: 0.25    Years: 2.00    Pack years: 0.50    Types: Cigarettes    Last attempt to quit: 04/04/1998    Years since quitting: 19.1  . Smokeless tobacco: Never  Used  Substance Use Topics  . Alcohol use: No    Alcohol/week: 0.0 oz  . Drug use: No     Allergies   Amoxicillin; Sulfa drugs cross reactors; Percocet [oxycodone-acetaminophen]; and Sulfa antibiotics   Review of Systems Review of Systems  Review of Systems  Constitutional: Negative for fever and chills.  HENT: Negative for ear pain, sore throat and trouble swallowing.   Eyes: Negative for pain and visual disturbance.  Respiratory: + cough/SOB Cardiovascular: Negative for chest pain and leg swelling.  Gastrointestinal: Negative for nausea, vomiting, abdominal pain and diarrhea.  Genitourinary: Negative for dysuria, urgency and frequency.  Musculoskeletal: Negative for back pain and joint swelling. See HPI Skin: Negative for rash and wound.  Neurological: Negative for dizziness, syncope, speech difficulty, weakness and numbness.   Physical Exam Updated Vital Signs BP (!) 90/53   Pulse (!) 124   Temp 98.8 F (37.1 C) (Oral)   Resp (!) 22   Ht _0  (1.651 m)   Wt 90.7 kg (200 lb)   SpO2 96%   BMI 33.28 kg/m   Physical Exam  Physical Exam Vitals:   05/21/17 1658 05/21/17 1700  BP: 110/80 (!) 90/53  Pulse: (!) 122 (!) 124  Resp: 20 (!) 22  Temp: 98.8 F (37.1 C)   SpO2: 97% 96%   Constitutional: Patient is in no acute distress Head: Normocephalic and atraumatic.  Eyes: Extraocular motion intact, no scleral icterus Neck: Supple without meningismus, mass, or overt JVD Respiratory: Wheeze/rales bilateral with diminished lung sounds, no acute respiratory distress. CV: Heart regular rate and rhythm, no obvious murmurs.  Pulses +2 and symmetric Abdomen: Soft, non-tender, non-distended MSK: Extremities are atraumatic without deformity, ROM intact; right anterior chest wall reproducible pain.  No overlying rashes concerning for zoster. Skin: Warm, dry, intact Neuro: Alert and oriented, no motor deficit noted Psychiatric: Mood and affect are normal.  ED Treatments  / Results  Labs (all labs ordered are listed, but only abnormal results are displayed) Labs Reviewed  CBC WITH DIFFERENTIAL/PLATELET - Abnormal; Notable for the following components:      Result Value   WBC 19.6 (*)  RDW 16.5 (*)    Neutro Abs 17.3 (*)    Monocytes Absolute 1.3 (*)    All other components within normal limits  COMPREHENSIVE METABOLIC PANEL - Abnormal; Notable for the following components:   Chloride 98 (*)    Glucose, Bld 128 (*)    BUN 54 (*)    Creatinine, Ser 7.80 (*)    Calcium 6.7 (*)    Total Protein 6.0 (*)    Albumin 3.1 (*)    GFR calc non Af Amer 4 (*)    GFR calc Af Amer 5 (*)    Anion gap 18 (*)    All other components within normal limits  PROTIME-INR - Abnormal; Notable for the following components:   Prothrombin Time 41.9 (*)    INR 4.43 (*)    All other components within normal limits  BRAIN NATRIURETIC PEPTIDE - Abnormal; Notable for the following components:   B Natriuretic Peptide 667.9 (*)    All other components within normal limits  I-STAT VENOUS BLOOD GAS, ED - Abnormal; Notable for the following components:   pO2, Ven 25.0 (*)    All other components within normal limits  CULTURE, BLOOD (ROUTINE X 2)  CULTURE, BLOOD (ROUTINE X 2)  CULTURE, EXPECTORATED SPUTUM-ASSESSMENT  BLOOD GAS, VENOUS  URINALYSIS, ROUTINE W REFLEX MICROSCOPIC  I-STAT TROPONIN, ED  I-STAT CG4 LACTIC ACID, ED    EKG  EKG Interpretation  Date/Time:  _37  year old female history of atrial fibrillation/anticoagulation Coumadin, diabetes, end-stage renal disease dialysis Monday Wednesdays Fridays, hypertension, hyperlipidemia, morbid obesity has been recently evaluated by family medicine physician on February 11  with respiratory symptoms concerning for COPD exacerbation/infectious etiology was sent out with 10-day regimen of Levaquin along with 5-day burst of steroids.  Patient presents today  with persistent shortness of breath/dyspnea and new onset right-sided anterior chest wall pain inferior to right breast that is reproducible.  Patient denies any recent trauma/falls and has been compliant with her anticoagulation.  Patient is still compliant with her antibiotics/steroids.  Patient was found by EMS with a room air O2 89% subsequently given DuoNeb.  Patient arrives hypotensive with systolic in the 60O, tachycardic, elevated white count of 19,000, lactic acid 1.75, troponin negative x1, INR therapeutic coagulation,  chest x-ray concerning for right lower lobe pneumonia.  Patient had been diagnosed previously for our influenza and failed all treatment with Tamiflu p.o. antibiotics.  Patient was started on IV antibiotics (vanc/aztreonam) Cultures pending.  EKG without findings for ACS.  Pt admitted for Sepsis/Hypoxic respiratory failure with PNA; doubt ACS; doubt PE   Final Clinical Impressions(s) / ED Diagnoses   Final diagnoses:  Community acquired pneumonia of right lung, unspecified part of lung  Sepsis, due to unspecified organism [facility])  Acute respiratory failure with hypoxia [facility])    ED Discharge Orders    None       Willette Alma, DO 05/22/17 [place], MD 05/23/17 1220

## 2017-05-21 NOTE — Progress Notes (Signed)
Pharmacy Antibiotic Note  [name] is a 77 y.o. female admitted on 05/21/2017 with sepsis.  ESRD MWF HD  Plan: Aztreonam 2 g x 1 then 500 q12 vanc 2 g  F/U HD for additional vanc  Height: 5\' 5"  (165.1 cm) Weight: 200 lb (90.7 kg) IBW/kg (Calculated) : 57  Temp (24hrs), Avg:98.8 F (37.1 C), Min:98.8 F (37.1 C), Max:98.8 F (37.1 C)  Recent Labs  Lab 05/21/17 1750 05/21/17 1855  WBC 19.6*  --   CREATININE 7.80*  --   LATICACIDVEN  --  1.75    Estimated Creatinine Clearance: 6.7 mL/min (A) (by C-G formula based on SCr of 7.8 mg/dL (H)).    Allergies  Allergen Reactions  . Amoxicillin Rash    Has patient had a PCN reaction causing immediate rash, facial/tongue/throat swelling, SOB or lightheadedness with hypotension:YES Has patient had a PCN reaction causing severe rash involving mucus membranes or skin necrosis: Yes Has patient had a PCN reaction that required hospitalization No Has patient had a PCN reaction occurring within the last 10 years: Yes If all of the above answers are "NO", then may proceed with Cephalosporin use.   . Sulfa Drugs Cross Reactors Hives and Itching  . Percocet [Oxycodone-Acetaminophen] Itching and Rash    Did not happen last time she took it  . Sulfa Antibiotics Hives   Levester Fresh, PharmD, BCPS, BCCCP Clinical Pharmacist Clinical phone for 05/21/2017 from 1430 [phone]: [phone] If after 2300, please call main pharmacy at: [phone] 05/21/2017 7:37 PM

## 2017-05-21 NOTE — Progress Notes (Signed)
ANTICOAGULATION CONSULT NOTE - Initial Consult  Pharmacy Consult for Coumadin Indication: atrial fibrillation  Allergies  Allergen Reactions  . Amoxicillin Rash    Has patient had a PCN reaction causing immediate rash, facial/tongue/throat swelling, SOB or lightheadedness with hypotension:YES Has patient had a PCN reaction causing severe rash involving mucus membranes or skin necrosis: Yes Has patient had a PCN reaction that required hospitalization No Has patient had a PCN reaction occurring within the last 10 years: Yes If all of the above answers are "NO", then may proceed with Cephalosporin use.   . Sulfa Drugs Cross Reactors Hives and Itching  . Percocet [Oxycodone-Acetaminophen] Itching and Rash    Did not happen last time she took it  . Sulfa Antibiotics Hives    Patient Measurements: Height: 5' 5"  (165.1 cm) Weight: 200 lb (90.7 kg) IBW/kg (Calculated) : 57  Vital Signs: Temp: 98.8 F (37.1 C) (02/17 1658) Temp Source: Oral (02/17 1658) BP: 105/75 (02/17 2245) Pulse Rate: 117 (02/17 2245)  Labs: Recent Labs    05/21/17 1750  HGB 13.6  HCT 42.6  PLT 216  LABPROT 41.9*  INR 4.43*  CREATININE 7.80*    Estimated Creatinine Clearance: 6.7 mL/min (A) (by C-G formula based on SCr of 7.8 mg/dL (H)).   Medical History: Past Medical History:  Diagnosis Date  . A-fib ([place])   . Anemia   . Arthritis   . Diabetes mellitus    "diet controlled"  . Diabetes mellitus without complication ([place])   . ESRD (end stage renal disease) ([place])    dialysis MWF  . GERD (gastroesophageal reflux disease)   . Gout   . History of blood transfusion   . HLD (hyperlipidemia)   . HOH (hard of hearing)    left ear  . Hypertension    hypotensive -since starting dialysis  . Hypothyroidism   . PAF (paroxysmal atrial fibrillation) ([place])    a. Echo 11/16:  Mild LVH, EF 55-60%, normal wall motion, MAC, mild MR, severe LAE (49 ml/m2), mild RVE, normal RVSF, mild RAE, mild TR, PASP 24  mmHg;  CHADS2-VASc: 4 >> Coumadin followed by PCP  . Renal disorder     Medications:  No current facility-administered medications on file prior to encounter.    Current Outpatient Medications on File Prior to Encounter  Medication Sig Dispense Refill  . ACCU-CHEK AVIVA PLUS test strip     . albuterol (PROVENTIL HFA;VENTOLIN HFA) 108 (90 Base) MCG/ACT inhaler Inhale 2 puffs into the lungs every 6 (six) hours as needed for wheezing or shortness of breath.    . budesonide-formoterol (SYMBICORT) 80-4.5 MCG/ACT inhaler Inhale 2 puffs into the lungs 2 (two) times daily. 1 Inhaler 12  . cinacalcet (SENSIPAR) 30 MG tablet Take 30 mg by mouth every other day.    . citalopram (CELEXA) 10 MG tablet Take 10 mg by mouth 2 (two) times daily.    . colchicine 0.6 MG tablet Take 1 tablet (0.6 mg total) by mouth every 3 (three) days. Dose needs to be decreased to twice a week in dialysis patient.    . ezetimibe (ZETIA) 10 MG tablet Take 10 mg by mouth daily.     . Ferric Citrate (AURYXIA) 1 GM 210 MG(Fe) TABS Take 2 tablets by mouth 3 (three) times daily before meals.    . gabapentin (NEURONTIN) 100 MG capsule Take 100 mg by mouth at bedtime.     Marland Kitchen HYDROcodone-acetaminophen (NORCO) 5-325 MG tablet Take 1 tablet by mouth every 6 (six)  hours as needed for moderate pain. 30 tablet 0  . levofloxacin (LEVAQUIN) 500 MG tablet Take 500 mg by mouth daily. For 10 days. Started 05-15-17    . levothyroxine (SYNTHROID, LEVOTHROID) 100 MCG tablet Take 100 mcg by mouth daily.      Marland Kitchen LORazepam (ATIVAN) 0.5 MG tablet TAKE ONE TABLET BY MOUTH EVERY 6 HOURS AS NEEDED FOR ANXIETY    . midodrine (PROAMATINE) 10 MG tablet Take 10 mg by mouth 3 (three) times daily.     . multivitamin (RENA-VIT) TABS tablet Take 1 tablet by mouth daily.    Marland Kitchen oseltamivir (TAMIFLU) 75 MG capsule Take 75 mg by mouth daily. Finished 05-20-17. For 5 days    . pantoprazole (PROTONIX) 40 MG tablet TAKE ONE TABLET BY MOUTH EVERY DAY 30-60MINUTES BEFORE  FIRST MEAL OF THE DAY 30 tablet 2  . predniSONE (DELTASONE) 20 MG tablet Take 20 mg by mouth 2 (two) times daily. For 5 days. Finished 05-20-17    . Vitamin D, Ergocalciferol, (DRISDOL) 50000 UNITS CAPS Take 50,000 Units by mouth every Monday.     . warfarin (COUMADIN) 5 MG tablet Take 5-7.5 mg by mouth as directed. 5 mg on all days except on Wednesday pt takes 7.5 mg      Assessment: 77 y.o. female admitted with sepsis, h/o Afib, to continue Coumadin.  INR supratherapeutic tonight  Goal of Therapy:  INR 2-3 Monitor platelets by anticoagulation protocol: Yes   Plan:  Daily INR  Ngai Parcell, [place]onson Curb 05/21/2017,11:27 PM

## 2017-05-22 LAB — EXPECTORATED SPUTUM ASSESSMENT W GRAM STAIN, RFLX TO RESP C

## 2017-05-22 LAB — BASIC METABOLIC PANEL
Anion gap: 18 — ABNORMAL HIGH (ref 5–15)
BUN: 62 mg/dL — ABNORMAL HIGH (ref 6–20)
CO2: 20 mmol/L — ABNORMAL LOW (ref 22–32)
Calcium: 6.3 mg/dL — CL (ref 8.9–10.3)
Chloride: 97 mmol/L — ABNORMAL LOW (ref 101–111)
Creatinine, Ser: 8.15 mg/dL — ABNORMAL HIGH (ref 0.44–1.00)
GFR calc Af Amer: 5 mL/min — ABNORMAL LOW (ref >60)
GFR calc non Af Amer: 4 mL/min — ABNORMAL LOW (ref >60)
Glucose, Bld: 163 mg/dL — ABNORMAL HIGH (ref 65–99)
Potassium: 3.8 mmol/L (ref 3.5–5.1)
Sodium: 135 mmol/L (ref 135–145)

## 2017-05-22 LAB — BLOOD CULTURE ID PANEL (REFLEXED)

## 2017-05-22 LAB — TROPONIN I
Troponin I: 0.03 ng/mL (ref <0.03)
Troponin I: 0.26 ng/mL (ref <0.03)

## 2017-05-22 LAB — CBG MONITORING, ED
Glucose-Capillary: 102 mg/dL — ABNORMAL HIGH (ref 65–99)
Glucose-Capillary: 113 mg/dL — ABNORMAL HIGH (ref 65–99)
Glucose-Capillary: 196 mg/dL — ABNORMAL HIGH (ref 65–99)

## 2017-05-22 LAB — CBC
HCT: 39.2 % (ref 36.0–46.0)
Hemoglobin: 12.7 g/dL (ref 12.0–15.0)
MCH: 31.5 pg (ref 26.0–34.0)
MCHC: 32.4 g/dL (ref 30.0–36.0)
MCV: 97.3 fL (ref 78.0–100.0)
Platelets: 198 10*3/uL (ref 150–400)
RBC: 4.03 MIL/uL (ref 3.87–5.11)
RDW: 16.5 % — ABNORMAL HIGH (ref 11.5–15.5)
WBC: 17 10*3/uL — ABNORMAL HIGH (ref 4.0–10.5)

## 2017-05-22 LAB — PROTIME-INR
INR: 5.3
Prothrombin Time: 48.2 seconds — ABNORMAL HIGH (ref 11.4–15.2)

## 2017-05-22 LAB — EXPECTORATED SPUTUM ASSESSMENT W REFEX TO RESP CULTURE

## 2017-05-22 LAB — GLUCOSE, CAPILLARY
Glucose-Capillary: 94 mg/dL (ref 65–99)
Glucose-Capillary: 166 mg/dL — ABNORMAL HIGH (ref 65–99)

## 2017-05-22 MED ORDER — ALBUTEROL SULFATE (2.5 MG/3ML) 0.083% IN NEBU
2.5000 mg | INHALATION_SOLUTION | Freq: Four times a day (QID) | RESPIRATORY_TRACT | Status: DC
  Filled 2017-05-22: qty 3

## 2017-05-22 MED ORDER — ALBUTEROL SULFATE (2.5 MG/3ML) 0.083% IN NEBU
INHALATION_SOLUTION | RESPIRATORY_TRACT | Status: AC
  Filled 2017-05-22: qty 3

## 2017-05-22 MED ORDER — IPRATROPIUM-ALBUTEROL 0.5-2.5 (3) MG/3ML IN SOLN
3.0000 mL | Freq: Three times a day (TID) | RESPIRATORY_TRACT | Status: DC
  Administered 2017-05-23 (×2): 3 mL via RESPIRATORY_TRACT
  Filled 2017-05-22 (×3): qty 3

## 2017-05-22 MED ORDER — NEPRO/CARBSTEADY PO LIQD
237.0000 mL | Freq: Two times a day (BID) | ORAL | Status: DC
  Administered 2017-05-23 – 2017-05-25 (×2): 237 mL via ORAL
  Filled 2017-05-22: qty 237

## 2017-05-22 MED ORDER — IPRATROPIUM BROMIDE 0.02 % IN SOLN
0.5000 mg | Freq: Four times a day (QID) | RESPIRATORY_TRACT | Status: DC
  Filled 2017-05-22: qty 2.5

## 2017-05-22 MED ORDER — VANCOMYCIN HCL IN DEXTROSE 1-5 GM/200ML-% IV SOLN
1000.0000 mg | INTRAVENOUS | Status: AC
  Administered 2017-05-22: 1000 mg via INTRAVENOUS

## 2017-05-22 MED ORDER — VANCOMYCIN HCL IN DEXTROSE 1-5 GM/200ML-% IV SOLN
INTRAVENOUS | Status: AC
  Filled 2017-05-22: qty 200

## 2017-05-22 MED ORDER — IPRATROPIUM-ALBUTEROL 0.5-2.5 (3) MG/3ML IN SOLN
3.0000 mL | Freq: Four times a day (QID) | RESPIRATORY_TRACT | Status: DC
  Administered 2017-05-22: 3 mL via RESPIRATORY_TRACT

## 2017-05-22 MED ORDER — DOXERCALCIFEROL 4 MCG/2ML IV SOLN
1.0000 ug | INTRAVENOUS | Status: DC
  Administered 2017-05-22 – 2017-05-27 (×3): 1 ug via INTRAVENOUS
  Filled 2017-05-22 (×2): qty 2

## 2017-05-22 MED ORDER — DOXERCALCIFEROL 4 MCG/2ML IV SOLN
INTRAVENOUS | Status: AC
  Filled 2017-05-22: qty 2

## 2017-05-22 MED ORDER — ACETAMINOPHEN 325 MG PO TABS: 650.0000 mg | ORAL_TABLET | Freq: Three times a day (TID) | ORAL | Status: DC | PRN

## 2017-05-22 MED ORDER — MIDODRINE HCL 5 MG PO TABS
ORAL_TABLET | ORAL | Status: AC
  Filled 2017-05-22: qty 2

## 2017-05-22 MED ORDER — MORPHINE SULFATE (PF) 2 MG/ML IV SOLN
2.0000 mg | Freq: Once | INTRAVENOUS | Status: AC
  Administered 2017-05-22: 2 mg via INTRAVENOUS
  Filled 2017-05-22: qty 1

## 2017-05-22 NOTE — Progress Notes (Signed)
Pharmacy Antibiotic Note  [name] is a 77 y.o. female admitted on 05/21/2017 with MRSA bacteremia. ESRD MWF HD  Given load dose yesterday, charted 3 doses of 1g vancomycin but looks like patient just got 2g load dose.  Plan: Aztreonam 500mg  IV Q12 Give vancomycin 1g IV x 1 today with HD F/U HD for additional vanc  Consider stopping aztreonam?  Height: 5\' 5"  (165.1 cm) Weight: (UTD on ED Stretcher) IBW/kg (Calculated) : 57  Temp (24hrs), Avg:98.8 F (37.1 C), Min:98.7 F (37.1 C), Max:98.8 F (37.1 C)  Recent Labs  Lab 05/21/17 1750 05/21/17 1855 05/22/17 0303  WBC 19.6*  --  17.0*  CREATININE 7.80*  --  8.15*  LATICACIDVEN  --  1.75  --     Estimated Creatinine Clearance: 6.4 mL/min (A) (by C-G formula based on SCr of 8.15 mg/dL (H)).    Allergies  Allergen Reactions  . Amoxicillin Rash    Has patient had a PCN reaction causing immediate rash, facial/tongue/throat swelling, SOB or lightheadedness with hypotension:YES Has patient had a PCN reaction causing severe rash involving mucus membranes or skin necrosis: Yes Has patient had a PCN reaction that required hospitalization No Has patient had a PCN reaction occurring within the last 10 years: Yes If all of the above answers are "NO", then may proceed with Cephalosporin use.   . Sulfa Drugs Cross Reactors Hives and Itching  . Percocet [Oxycodone-Acetaminophen] Itching and Rash    Did not happen last time she took it  . Sulfa Antibiotics Hives   Elenor Quinones, PharmD, [facility] Clinical Pharmacist Pager [phone] 05/22/2017 3:57 PM

## 2017-05-22 NOTE — Consult Note (Signed)
[name]    Date of Admission:  05/21/2017     Total days of antibiotics 1               Reason for Consult: MRSA BCID     Referring Provider: CHAMP   Assessment: 77 y.o. 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 She was treated on 2/13 with Levaquin, steroids and Tamiflu per nephrology team without improvement. BCID positive for MRSA. Heavier GPCs on respiratory specimen.  Chest XRay is not impressive however her CT does show concern for some consolidation/pneumonia on the right side where she is c/o her chest pain. She likely has post-influenza MRSA pneumonia which explains her lack of improvement with Levaquin.   Plan: 1. Stop aztreonam.  2. Continue Vancomycin.  3. Although source is likely PNA she is bacteremic from this and her HD catheter is likely seeded. Would recommend to D/C this and give a line holiday.  4. Would also get TTE.  5. Will repeat BCx once HD catheter is out.  6. Would not continue tamiflu as she has completed treatment course from this.   Principal Problem:   Acute respiratory failure with hypoxia (HCC) Active Problems:   Hypothyroidism   Anemia   Atrial fibrillation ([place])   . albuterol  2.5 mg Nebulization Q4H  . albuterol      . budesonide (PULMICORT) nebulizer solution  0.25 mg Nebulization BID  . cinacalcet  30 mg Oral QODAY  . citalopram  10 mg Oral BID  . doxercalciferol      . doxercalciferol  1 mcg Intravenous Q M,W,F-HD  . ezetimibe  10 mg Oral Daily  . [START ON 05/23/2017] feeding supplement (NEPRO CARB STEADY)  237 mL Oral BID BM  . ferric citrate  420 mg Oral TID AC  . gabapentin  100 mg Oral QHS  . insulin aspart  0-9 Units Subcutaneous TID WC  . ipratropium  0.5 mg Nebulization Q4H  . levothyroxine  100 mcg Oral QAC breakfast  . midodrine      . midodrine  10 mg  Oral TID WC  . multivitamin  1 tablet Oral Daily  . pantoprazole  40 mg Oral Daily  . Warfarin - Pharmacist Dosing Inpatient   Does not apply q1800    HPI: [name] is a 77 y.o. female admitted with worsening cough, shortness of breath and wheezing after outpatient treatment with Levaquin + Steroids. She was found to be influenza A positive and treated appropriately with Tamiflu. She has a past medical history significant for Afib, ESRD on HD via right chest tunneled catheter and diabetes.   She tells me she did improve initially however "not much" and started to worsen from respiratory status over the last 48 hours. She mostly has a dry cough but does have sputum production at times. Chest pain over the right side has started as well. No fevers since treatment for flu.   Review of Systems: Review of Systems  Constitutional: Positive for chills and fever.  HENT: Negative for sinus pain, sore throat and tinnitus.   Eyes: Negative for blurred vision.  Respiratory: Positive for cough, sputum production, shortness of breath and wheezing.   Cardiovascular: Negative for chest pain and leg swelling.  Gastrointestinal: Negative for abdominal pain and diarrhea.  Genitourinary:  On dialysis Does not make urine per her account.   Musculoskeletal: Positive for myalgias.  Skin: Negative for rash.  Neurological: Positive for weakness. Negative for focal weakness and headaches.    Past Medical History:  Diagnosis Date  . A-fib ([place])   . Anemia   . Arthritis   . Diabetes mellitus    "diet controlled"  . Diabetes mellitus without complication ([place])   . ESRD (end stage renal disease) ([place])    dialysis MWF  . GERD (gastroesophageal reflux disease)   . Gout   . History of blood transfusion   . HLD (hyperlipidemia)   . HOH (hard of hearing)    left ear  . Hypertension    hypotensive -since starting dialysis  . Hypothyroidism   . PAF (paroxysmal atrial fibrillation) ([place])    a. Echo  11/16:  Mild LVH, EF 55-60%, normal wall motion, MAC, mild MR, severe LAE (49 ml/m2), mild RVE, normal RVSF, mild RAE, mild TR, PASP 24 mmHg;  CHADS2-VASc: 4 >> Coumadin followed by PCP  . Renal disorder     Social History   Tobacco Use  . Smoking status: Former Smoker    Packs/day: 0.25    Years: 2.00    Pack years: 0.50    Types: Cigarettes    Last attempt to quit: 04/04/1998    Years since quitting: 19.1  . Smokeless tobacco: Never Used  Substance Use Topics  . Alcohol use: No    Alcohol/week: 0.0 oz  . Drug use: No    Family History  Problem Relation Age of Onset  . Diabetes Father        before age 73  . Heart disease Father   . Diabetes Sister   . Cancer Brother   . Hyperlipidemia Daughter   . Hypertension Daughter   . Hypertension Son   . Lung cancer Sister   . Cancer Sister    Allergies  Allergen Reactions  . Amoxicillin Rash    Has patient had a PCN reaction causing immediate rash, facial/tongue/throat swelling, SOB or lightheadedness with hypotension:YES Has patient had a PCN reaction causing severe rash involving mucus membranes or skin necrosis: Yes Has patient had a PCN reaction that required hospitalization No Has patient had a PCN reaction occurring within the last 10 years: Yes If all of the above answers are "NO", then may proceed with Cephalosporin use.   . Sulfa Drugs Cross Reactors Hives and Itching  . Percocet [Oxycodone-Acetaminophen] Itching and Rash    Did not happen last time she took it  . Sulfa Antibiotics Hives    OBJECTIVE: Blood pressure (!) 83/64, pulse 63, temperature 98.7 F (37.1 C), temperature source Oral, resp. rate 19, height _0  (1.651 m), weight 200 lb (90.7 kg), SpO2 95 %.  Physical Exam  Constitutional: She is oriented to person, place, and time and well-developed, well-nourished, and in no distress.  Elderly woman lying on stretcher receiving HD   HENT:  Mouth/Throat: Oropharynx is clear and moist. No oral lesions. No  dental abscesses.  Eyes: Pupils are equal, round, and reactive to light.  Cardiovascular: Regular rhythm and normal heart sounds. Tachycardia present.  No murmur heard. Pulmonary/Chest: Effort normal. Tachypnea noted. She has wheezes. She has rales.  Tunneled HD cath clean, dry and intact on right chest.   Abdominal: Soft. She exhibits no distension. There is no tenderness.  Musculoskeletal: Normal range of motion. She exhibits no tenderness.  Lymphadenopathy:    She has no cervical adenopathy.  Neurological: She is alert and oriented to person, place, and time.  Skin: Skin is warm and dry. No rash noted.  Hands/feet without stigmata of SAB  Psychiatric: Mood, affect and judgment normal.  Vitals reviewed.   Lab Results Lab Results  Component Value Date   WBC 17.0 (H) 05/22/2017   HGB 12.7 05/22/2017   HCT 39.2 05/22/2017   MCV 97.3 05/22/2017   PLT 198 05/22/2017    Lab Results  Component Value Date   CREATININE 8.15 (H) 05/22/2017   BUN 62 (H) 05/22/2017   NA 135 05/22/2017   K 3.8 05/22/2017   CL 97 (L) 05/22/2017   CO2 20 (L) 05/22/2017    Lab Results  Component Value Date   ALT 20 05/21/2017   AST 24 05/21/2017   ALKPHOS 103 05/21/2017   BILITOT 1.0 05/21/2017     Microbiology: Recent Results (from the past 240 hour(s))  Blood Culture (routine x 2)     Status: None (Preliminary result)   Collection Time: 05/21/17  6:41 PM  Result Value Ref Range Status   Specimen Description BLOOD RIGHT FOREARM  Final   Special Requests   Final    BOTTLES DRAWN AEROBIC AND ANAEROBIC Blood Culture adequate volume   Culture  Setup Time   Final    GRAM POSITIVE COCCI ANAEROBIC BOTTLE ONLY Organism ID to follow CRITICAL RESULT CALLED TO, READ BACK BY AND VERIFIED WITHEzekiel Slocumb PHARMD [id number] 05/22/17 A BROWNING Performed at [facility], [place] [address], [place], [place] [address]    Culture GRAM POSITIVE COCCI  Final   Report Status PENDING  Incomplete  Blood Culture  ID Panel (Reflexed)     Status: Abnormal   Collection Time: 05/21/17  6:41 PM  Result Value Ref Range Status   Enterococcus species NOT DETECTED NOT DETECTED Final   Listeria monocytogenes NOT DETECTED NOT DETECTED Final   Staphylococcus species DETECTED (A) NOT DETECTED Final    Comment: CRITICAL RESULT CALLED TO, READ BACK BY AND VERIFIED WITH: E SINCLAIR PHARMD 1508 05/22/17 A BROWNING    Staphylococcus aureus DETECTED (A) NOT DETECTED Final    Comment: Methicillin (oxacillin)-resistant Staphylococcus aureus (MRSA). MRSA is predictably resistant to beta-lactam antibiotics (except ceftaroline). Preferred therapy is vancomycin unless clinically contraindicated. Patient requires contact precautions if  hospitalized. CRITICAL RESULT CALLED TO, READ BACK BY AND VERIFIED WITH: E SINCLAIR PHARMD 1508 05/22/17 A BROWNING    Methicillin resistance DETECTED (A) NOT DETECTED Final    Comment: CRITICAL RESULT CALLED TO, READ BACK BY AND VERIFIED WITH: E SINCLAIR PHARMD 1508 05/22/17 A BROWNING    Streptococcus species NOT DETECTED NOT DETECTED Final   Streptococcus agalactiae NOT DETECTED NOT DETECTED Final   Streptococcus pneumoniae NOT DETECTED NOT DETECTED Final   Streptococcus pyogenes NOT DETECTED NOT DETECTED Final   Acinetobacter baumannii NOT DETECTED NOT DETECTED Final   Enterobacteriaceae species NOT DETECTED NOT DETECTED Final   Enterobacter cloacae complex NOT DETECTED NOT DETECTED Final   Escherichia coli NOT DETECTED NOT DETECTED Final   Klebsiella oxytoca NOT DETECTED NOT DETECTED Final   Klebsiella pneumoniae NOT DETECTED NOT DETECTED Final   Proteus species NOT DETECTED NOT DETECTED Final   Serratia marcescens NOT DETECTED NOT DETECTED Final   Haemophilus influenzae NOT DETECTED NOT DETECTED Final   Neisseria meningitidis NOT DETECTED NOT DETECTED Final   Pseudomonas aeruginosa NOT DETECTED NOT DETECTED Final   Candida albicans NOT DETECTED NOT DETECTED Final   Candida  glabrata NOT DETECTED NOT  DETECTED Final   Candida krusei NOT DETECTED NOT DETECTED Final   Candida parapsilosis NOT DETECTED NOT DETECTED Final   Candida tropicalis NOT DETECTED NOT DETECTED Final    Comment: Performed at [facility], [place] [address], [place], [place] [address]  Blood Culture (routine x 2)     Status: None (Preliminary result)   Collection Time: 05/21/17  6:46 PM  Result Value Ref Range Status   Specimen Description BLOOD LEFT ANTECUBITAL  Final   Special Requests   Final    BOTTLES DRAWN AEROBIC AND ANAEROBIC Blood Culture adequate volume   Culture   Final    NO GROWTH < 24 HOURS Performed at [facility], [place] [address] [place], [place] [address]    Report Status PENDING  Incomplete  Culture, expectorated sputum-assessment     Status: None   Collection Time: 05/21/17  7:03 PM  Result Value Ref Range Status   Specimen Description SPUTUM  Final   Special Requests NONE  Final   Sputum evaluation   Final    THIS SPECIMEN IS ACCEPTABLE FOR SPUTUM CULTURE Performed at [facility], [address] [place], [place] [address]    Report Status 05/22/2017 FINAL  Final  Culture, respiratory (NON-Expectorated)     Status: None (Preliminary result)   Collection Time: 05/21/17  7:03 PM  Result Value Ref Range Status   Specimen Description SPUTUM  Final   Special Requests NONE Reflexed from [id number]  Final   Gram Stain   Final    FEW WBC PRESENT, PREDOMINANTLY PMN ABUNDANT GRAM POSITIVE COCCI IN CLUSTERS MODERATE GRAM NEGATIVE RODS FEW YEAST Performed at [facility], [address], [place], [place] [address]    Culture PENDING  Incomplete   Report Status PENDING  Incomplete    Janene Madeira, MSN, NP-C [facility] Cell: [phone] Pager: [phone]  05/22/2017 4:25 PM

## 2017-05-22 NOTE — Consult Note (Signed)
[facility] Renal Consultation Note    Indication for Consultation:  Management of ESRD/hemodialysis; anemia, hypertension/volume and secondary hyperparathyroidism PCP: Dr. Dione Housekeeper  HPI: [name] is a 77 y.o. female with ESRD on hemodialysis M,W,F at [facility]. PMH significant for DM, HTN (now chronic hypotension on midodrine TIW during HD), AFIB on coumadin, GERD, Gout, HLD, AOCD, SHPT. Last HD 05/19/17.   Patient presented to ED 05/21/17 with C/O SOB, non-productive cough and R sided chest pain. Apparently pt was seen by PCP and started on ABX and prednisone as well as treated with oseltamivir. Upon arrival to ED, she had leukocytosis WBC-19.6 Lactic acid 1.75 CXR showed Low lung volumes with mild bibasilar atelectasis. CT angio of chest showed Small right greater than left pleural effusion. Partial consolidation within the bilateral lower lobes and the right middle lobe may reflect atelectasis or pneumonia. BC were drawn and she has been started on Vanc/Aztreonam per primary and admitted with acute respiratory failure likely 2/2 pneumonia.   Seen in ED currently HR 120 with Temperature 100.3. Still S/O R sided chest pain particularly when taking deep breath. Denies SOB. Says she feels better than she did. Says she never improved after taking ABX as OP and the pain in her R chest worsened making it difficult to take deep breaths. She endorses non-productive cough. She has no evidence of volume overload by exam.   Past Medical History:  Diagnosis Date  . A-fib ([place])   . Anemia   . Arthritis   . Diabetes mellitus    "diet controlled"  . Diabetes mellitus without complication ([place])   . ESRD (end stage renal disease) ([place])    dialysis MWF  . GERD (gastroesophageal reflux disease)   . Gout   . History of blood transfusion   . HLD (hyperlipidemia)   . HOH (hard of hearing)    left ear  . Hypertension    hypotensive -since starting dialysis   . Hypothyroidism   . PAF (paroxysmal atrial fibrillation) ([place])    a. Echo 11/16:  Mild LVH, EF 55-60%, normal wall motion, MAC, mild MR, severe LAE (49 ml/m2), mild RVE, normal RVSF, mild RAE, mild TR, PASP 24 mmHg;  CHADS2-VASc: 4 >> Coumadin followed by PCP  . Renal disorder    Past Surgical History:  Procedure Laterality Date  . AV FISTULA PLACEMENT  04/03/2012   Procedure: ARTERIOVENOUS (AV) FISTULA CREATION;  Surgeon: Elam Dutch, MD;  Location: [place];  Service: Vascular;  Laterality: Left;  creation left brachial cephalic fistula   . CARPAL TUNNEL RELEASE Left   . COLONOSCOPY W/ POLYPECTOMY    . DILATION AND CURETTAGE OF UTERUS    . EYE SURGERY Bilateral    bilateral cataract removal  . Hemodialysis  catheter Right   . JOINT REPLACEMENT Bilateral    bilateral knee  . JOINT REPLACEMENT Right    shoulder  . LIGATION OF ARTERIOVENOUS  FISTULA Left 02/04/2015   Procedure: LIGATION OF BRACHIOCEPHALIC ARTERIOVENOUS  FISTULA;  Surgeon: Conrad Ravenna, MD;  Location: [place];  Service: Vascular;  Laterality: Left;  . PORTACATH PLACEMENT    . REVERSE SHOULDER ARTHROPLASTY Left 01/22/2016   Procedure: LEFT REVERSE SHOULDER ARTHROPLASTY;  Surgeon: Netta Cedars, MD;  Location: [place];  Service: Orthopedics;  Laterality: Left;  . SPLIT NIGHT STUDY  07/26/2015  . STERIOD INJECTION Right 01/22/2016   Procedure: RIGHT RING FINGER STEROID INJECTION;  Surgeon: Netta Cedars, MD;  Location: [place];  Service: Orthopedics;  Laterality: Right;  . THROMBECTOMY BRACHIAL ARTERY Left 02/06/2015   Procedure: EVACUATION OF LEFT ARM HEMATOMA;  Surgeon: Angelia Mould, MD;  Location: [place];  Service: Vascular;  Laterality: Left;  . TOTAL KNEE ARTHROPLASTY     right knee  . TUBAL LIGATION     Family History  Problem Relation Age of Onset  . Diabetes Father        before age 89  . Heart disease Father   . Diabetes Sister   . Cancer Brother   . Hyperlipidemia Daughter   . Hypertension Daughter    . Hypertension Son   . Lung cancer Sister   . Cancer Sister    Social History:  reports that she quit smoking about 19 years ago. Her smoking use included cigarettes. She has a 0.50 pack-year smoking history. she has never used smokeless tobacco. She reports that she does not drink alcohol or use drugs. Allergies  Allergen Reactions  . Amoxicillin Rash    Has patient had a PCN reaction causing immediate rash, facial/tongue/throat swelling, SOB or lightheadedness with hypotension:YES Has patient had a PCN reaction causing severe rash involving mucus membranes or skin necrosis: Yes Has patient had a PCN reaction that required hospitalization No Has patient had a PCN reaction occurring within the last 10 years: Yes If all of the above answers are "NO", then may proceed with Cephalosporin use.   . Sulfa Drugs Cross Reactors Hives and Itching  . Percocet [Oxycodone-Acetaminophen] Itching and Rash    Did not happen last time she took it  . Sulfa Antibiotics Hives   Prior to Admission medications   Medication Sig Start Date End Date Taking? Authorizing Provider  ACCU-CHEK AVIVA PLUS test strip  11/11/14  Yes [provider]  albuterol (PROVENTIL HFA;VENTOLIN HFA) 108 (90 Base) MCG/ACT inhaler Inhale 2 puffs into the lungs every 6 (six) hours as needed for wheezing or shortness of breath.   Yes [provider]  budesonide-formoterol (SYMBICORT) 80-4.5 MCG/ACT inhaler Inhale 2 puffs into the lungs 2 (two) times daily. 01/09/17  Yes Tanda Rockers, MD  cinacalcet (SENSIPAR) 30 MG tablet Take 30 mg by mouth every other day.   Yes [provider]  citalopram (CELEXA) 10 MG tablet Take 10 mg by mouth 2 (two) times daily.   Yes [provider]  colchicine 0.6 MG tablet Take 1 tablet (0.6 mg total) by mouth every 3 (three) days. Dose needs to be decreased to twice a week in dialysis patient. 04/14/16  Yes Debbe Odea, MD  ezetimibe (ZETIA) 10 MG tablet Take 10 mg by  mouth daily.    Yes [provider]  Ferric Citrate (AURYXIA) 1 GM 210 MG(Fe) TABS Take 2 tablets by mouth 3 (three) times daily before meals.   Yes [provider]  gabapentin (NEURONTIN) 100 MG capsule Take 100 mg by mouth at bedtime.  12/22/14 05/21/17 Yes [provider]  HYDROcodone-acetaminophen (NORCO) 5-325 MG tablet Take 1 tablet by mouth every 6 (six) hours as needed for moderate pain. 01/22/16  Yes Netta Cedars, MD  levofloxacin (LEVAQUIN) 500 MG tablet Take 500 mg by mouth daily. For 10 days. Started 05-15-17 05/15/17  Yes [provider]  levothyroxine (SYNTHROID, LEVOTHROID) 100 MCG tablet Take 100 mcg by mouth daily.     Yes [provider]  LORazepam (ATIVAN) 0.5 MG tablet TAKE ONE TABLET BY MOUTH EVERY 6 HOURS AS NEEDED FOR ANXIETY 09/01/16  Yes [provider]  midodrine (PROAMATINE) 10 MG tablet Take 10 mg by mouth 3 (three) times daily.    Yes [provider]  multivitamin (RENA-VIT) TABS tablet Take 1 tablet by mouth daily.   Yes [provider]  oseltamivir (TAMIFLU) 75 MG capsule Take 75 mg by mouth daily. Finished 05-20-17. For 5 days   Yes [provider]  pantoprazole (PROTONIX) 40 MG tablet TAKE ONE TABLET BY MOUTH EVERY DAY 30-60MINUTES BEFORE FIRST MEAL OF THE DAY 04/18/17  Yes Tanda Rockers, MD  predniSONE (DELTASONE) 20 MG tablet Take 20 mg by mouth 2 (two) times daily. For 5 days. Finished 05-20-17 05/15/17  Yes [provider]  Vitamin D, Ergocalciferol, (DRISDOL) 50000 UNITS CAPS Take 50,000 Units by mouth every Monday.    Yes [provider]  warfarin (COUMADIN) 5 MG tablet Take 5-7.5 mg by mouth as directed. 5 mg on all days except on Wednesday pt takes 7.5 mg   Yes [provider]   Current Facility-Administered Medications  Medication Dose Route Frequency Provider Last Rate Last Dose  . albuterol (PROVENTIL) (2.5 MG/3ML) 0.083% nebulizer solution 2.5 mg  2.5 mg  Nebulization Q4H Rise Patience, MD   2.5 mg at 05/22/17 0818  . albuterol (PROVENTIL) (2.5 MG/3ML) 0.083% nebulizer solution 2.5 mg  2.5 mg Nebulization Q2H PRN Rise Patience, MD      . aztreonam (AZACTAM) 500 mg in dextrose 5 % 50 mL IVPB  500 mg Intravenous Q12H Wynell Balloon, RPH   Stopped at 05/22/17 1012  . budesonide (PULMICORT) nebulizer solution 0.25 mg  0.25 mg Nebulization BID Rise Patience, MD   0.25 mg at 05/22/17 0813  . cinacalcet (SENSIPAR) tablet 30 mg  30 mg Oral Joellyn Quails, MD   30 mg at 05/21/17 2310  . citalopram (CELEXA) tablet 10 mg  10 mg Oral BID Rise Patience, MD   10 mg at 05/22/17 1010  . ezetimibe (ZETIA) tablet 10 mg  10 mg Oral Daily Rise Patience, MD   10 mg at 05/22/17 1011  . ferric citrate (AURYXIA) tablet 420 mg  420 mg Oral TID AC Rise Patience, MD   420 mg at 05/22/17 0815  . gabapentin (NEURONTIN) capsule 100 mg  100 mg Oral QHS Rise Patience, MD   100 mg at 05/21/17 2243  . HYDROcodone-acetaminophen (NORCO/VICODIN) 5-325 MG per tablet 1 tablet  1 tablet Oral Q6H PRN Rise Patience, MD   1 tablet at 05/22/17 0901  . insulin aspart (novoLOG) injection 0-9 Units  0-9 Units Subcutaneous TID WC Rise Patience, MD   Stopped at 05/22/17 0750  . ipratropium (ATROVENT) nebulizer solution 0.5 mg  0.5 mg Nebulization Q4H Rise Patience, MD   0.5 mg at 05/22/17 0818  . levothyroxine (SYNTHROID, LEVOTHROID) tablet 100 mcg  100 mcg Oral QAC breakfast Rise Patience, MD      . LORazepam (ATIVAN) tablet 0.5 mg  0.5 mg Oral Q6H PRN Rise Patience, MD      . midodrine (PROAMATINE) tablet 10 mg  10 mg Oral TID WC Rise Patience, MD   10 mg at 05/22/17 0815  . multivitamin (RENA-VIT) tablet 1 tablet  1 tablet Oral Daily Rise Patience, MD   1 tablet at 05/22/17 1011  . ondansetron (ZOFRAN) tablet 4 mg  4 mg Oral Q6H PRN Rise Patience, MD       Or  . ondansetron  [facility]) injection 4  mg  4 mg Intravenous Q6H PRN Rise Patience, MD      . pantoprazole (PROTONIX) EC tablet 40 mg  40 mg Oral Daily Rise Patience, MD   40 mg at 05/22/17 1010  . Warfarin - Pharmacist Dosing Inpatient   Does not apply [id number] Rise Patience, MD       Current Outpatient Medications  Medication Sig Dispense Refill  . ACCU-CHEK AVIVA PLUS test strip     . albuterol (PROVENTIL HFA;VENTOLIN HFA) 108 (90 Base) MCG/ACT inhaler Inhale 2 puffs into the lungs every 6 (six) hours as needed for wheezing or shortness of breath.    . budesonide-formoterol (SYMBICORT) 80-4.5 MCG/ACT inhaler Inhale 2 puffs into the lungs 2 (two) times daily. 1 Inhaler 12  . cinacalcet (SENSIPAR) 30 MG tablet Take 30 mg by mouth every other day.    . citalopram (CELEXA) 10 MG tablet Take 10 mg by mouth 2 (two) times daily.    . colchicine 0.6 MG tablet Take 1 tablet (0.6 mg total) by mouth every 3 (three) days. Dose needs to be decreased to twice a week in dialysis patient.    . ezetimibe (ZETIA) 10 MG tablet Take 10 mg by mouth daily.     . Ferric Citrate (AURYXIA) 1 GM 210 MG(Fe) TABS Take 2 tablets by mouth 3 (three) times daily before meals.    . gabapentin (NEURONTIN) 100 MG capsule Take 100 mg by mouth at bedtime.     Marland Kitchen HYDROcodone-acetaminophen (NORCO) 5-325 MG tablet Take 1 tablet by mouth every 6 (six) hours as needed for moderate pain. 30 tablet 0  . levofloxacin (LEVAQUIN) 500 MG tablet Take 500 mg by mouth daily. For 10 days. Started 05-15-17    . levothyroxine (SYNTHROID, LEVOTHROID) 100 MCG tablet Take 100 mcg by mouth daily.      Marland Kitchen LORazepam (ATIVAN) 0.5 MG tablet TAKE ONE TABLET BY MOUTH EVERY 6 HOURS AS NEEDED FOR ANXIETY    . midodrine (PROAMATINE) 10 MG tablet Take 10 mg by mouth 3 (three) times daily.     . multivitamin (RENA-VIT) TABS tablet Take 1 tablet by mouth daily.    Marland Kitchen oseltamivir (TAMIFLU) 75 MG capsule Take 75 mg by mouth daily. Finished 05-20-17. For 5 days    .  pantoprazole (PROTONIX) 40 MG tablet TAKE ONE TABLET BY MOUTH EVERY DAY 30-60MINUTES BEFORE FIRST MEAL OF THE DAY 30 tablet 2  . predniSONE (DELTASONE) 20 MG tablet Take 20 mg by mouth 2 (two) times daily. For 5 days. Finished 05-20-17    . Vitamin D, Ergocalciferol, (DRISDOL) 50000 UNITS CAPS Take 50,000 Units by mouth every Monday.     . warfarin (COUMADIN) 5 MG tablet Take 5-7.5 mg by mouth as directed. 5 mg on all days except on Wednesday pt takes 7.5 mg     Labs: Basic Metabolic Panel: Recent Labs  Lab 05/21/17 1750 05/22/17 0303  NA 138 135  K 4.0 3.8  CL 98* 97*  CO2 22 20*  GLUCOSE 128* 163*  BUN 54* 62*  CREATININE 7.80* 8.15*  CALCIUM 6.7* 6.3*   Liver Function Tests: Recent Labs  Lab 05/21/17 1750  AST 24  ALT 20  ALKPHOS 103  BILITOT 1.0  PROT 6.0*  ALBUMIN 3.1*   No results for input(s): LIPASE, AMYLASE in the last 168 hours. No results for input(s): AMMONIA in the last 168 hours. CBC: Recent Labs  Lab 05/21/17 1750 05/22/17 0303  WBC 19.6* 17.0*  NEUTROABS 17.3*  --  HGB 13.6 12.7  HCT 42.6 39.2  MCV 98.6 97.3  PLT 216 198   Cardiac Enzymes: Recent Labs  Lab 05/21/17 2250 05/22/17 0303  TROPONINI 0.03* 0.03*   CBG: Recent Labs  Lab 05/22/17 0022 05/22/17 0727  GLUCAP 196* 113*   Iron Studies: No results for input(s): IRON, TIBC, TRANSFERRIN, FERRITIN in the last 72 hours. Studies/Results: Ct Angio Chest Pe W Or Wo Contrast  Result Date: 05/22/2017 CLINICAL DATA:  Respiratory distress with tachycardia right-sided chest pain EXAM: CT ANGIOGRAPHY CHEST WITH CONTRAST TECHNIQUE: Multidetector CT imaging of the chest was performed using the standard protocol during bolus administration of intravenous contrast. Multiplanar CT image reconstructions and MIPs were obtained to evaluate the vascular anatomy. CONTRAST:  155m ISOVUE-370 IOPAMIDOL (ISOVUE-370) INJECTION 76% COMPARISON:  Radiograph 05/21/2017 FINDINGS: Cardiovascular: Satisfactory  opacification of the pulmonary arteries to the segmental level. No evidence of pulmonary embolism. Upper lobe evaluation is limited by streak artifact from metal in the shoulders, particularly on the right side. Nonaneurysmal aorta. Moderate aortic atherosclerosis. Coronary artery calcification. Mild cardiomegaly. No significant pericardial effusion. Mediastinum/Nodes: Midline trachea. No thyroid mass. Enlarged mediastinal lymph nodes, prevascular lymph node measures 11 mm. Subcarinal lymph node measures 16 mm.Esophagus within normal limits. Lungs/Pleura: Small right-sided pleural effusion and trace left pleural effusion. Partial consolidation within the right greater than left lung bases. Partial consolidation in the right middle lobe. Negative for a pneumothorax. Upper Abdomen: No acute abnormality. Musculoskeletal: Mild kyphosis. Degenerative changes. No acute or suspicious lesion Review of the MIP images confirms the above findings. IMPRESSION: 1. Suboptimal evaluation of the upper lobe vessels due to streak artifact from metallic hardware in the shoulders. No definite acute central embolus is seen 2. Small right greater than left pleural effusion. Partial consolidation within the bilateral lower lobes and the right middle lobe may reflect atelectasis or pneumonia 3. Enlarged mediastinal lymph nodes, possibly reactive 4. Cardiomegaly Aortic Atherosclerosis (ICD10-I70.0). Electronically Signed   By: KDonavan FoilM.D.   On: 05/22/2017 00:57   Dg Chest Port 1 View  Result Date: 05/21/2017 CLINICAL DATA:  Cough EXAM: PORTABLE CHEST 1 VIEW COMPARISON:  04/13/2016 chest radiograph. FINDINGS: Bilateral total shoulder arthroplasty is partially visualized. Right internal jugular central venous catheter terminates at the cavoatrial junction. Stable cardiomediastinal silhouette with mild cardiomegaly. No pneumothorax. No pleural effusion. No pulmonary edema. Low lung volumes with mild bibasilar atelectasis.  IMPRESSION: Low lung volumes with mild bibasilar atelectasis. Stable cardiomegaly without pulmonary edema. Electronically Signed   By: JIlona SorrelM.D.   On: 05/21/2017 19:20    ROS: As per HPI otherwise negative.   Physical Exam: Vitals:   05/22/17 0930 05/22/17 0945 05/22/17 1000 05/22/17 1015  BP: 101/69 103/73 105/71 92/62  Pulse: (!) 118 (!) 120 (!) 120 (!) 119  Resp: (!) 21 (!) 27 (!) 25 (!) 22  Temp:      TempSrc:      SpO2: 93% 90% 93% 93%  Weight:      Height:         General: Chronically ill appearing female in NAD Head: Normocephalic, atraumatic, sclera non-icteric, mucus membranes are moist Neck: Supple. JVD not elevated. Lungs: Clear bilaterally to auscultation decreased in bases. No WOB No pleural rub.  Heart: RRR with S1 S2. No murmurs, rubs, or gallops appreciated. Abdomen: Soft, non-tender, non-distended with normoactive bowel sounds. No rebound/guarding. No obvious abdominal masses. M-S:  Strength and tone appear normal for age. Lower extremities:without edema or ischemic changes, no open wounds  Neuro:  Alert and oriented X 3. Moves all extremities spontaneously. Psych:  Responds to questions appropriately with a normal affect. Dialysis Access: [facility] CDI.   Dialysis Orders: [place] MWF 4 hrs 160 NRe 350/800 manual 89 Kg 2.0 K/2.5 Ca  UFP 4 linear sodium -No heparin -Mircera 100 mcg IV q 2 weeks (last dose 05/08/17 HGB 12.7 05/17/17) -Venofer 50 mg IV weekly (last dose 05/15/17) -Hectorol 1 mcg IV TIW  Assessment/Plan: 1.  Acute respiratory failure/PNA-per primary.  2.  ESRD -  MWF-HD today per schedule. No heparin. K+ 3.8 4.0 K bath.  3.  Hypertension/volume  -SBP soft in low 100s. Wt 90.7 kg.  Attempt UFG 1 liters.  4.  Anemia  - HGB 12.7 No ESA needed.  5.  Metabolic bone disease -  Continue binders, VDRA, sensipar.  6.  Nutrition - Albumin 3.1 Renal/Carb. Renal vit nepro.  7.  H/O Afib: Contineu coumadin per primary 8.  DM: per primary   Rita  H. Owens Shark, NP-C 05/22/2017, 10:39 AM  [facility] Beeper [phone]  Pt seen, examined and agree w A/P as above.  Kelly Splinter MD [employer] pager [phone]   05/22/2017, 3:30 PM

## 2017-05-22 NOTE — ED Notes (Signed)
Placed pt on bedpan, tolerated well. Pt is two assist.

## 2017-05-22 NOTE — ED Notes (Signed)
Pt' CBG 113, nurse was notified. Pt received breakfast tray and now eating.

## 2017-05-22 NOTE — Progress Notes (Signed)
[facility] TEAM 1 - Stepdown/ICU TEAM  [name]  YIF:[id number] DOB: 1940/04/14 DOA: 05/21/2017 PCP: Dione Housekeeper, MD    Brief Narrative:  77 y.o. female with a hx of ESRD on hemodialysis, atrial fibrillation, hypothyroidism, and DM who was brought to the ER w/ persistent shortness of breath and right-sided chest pain.  She was started on antibiotics and prednisone on February 11 by her PCP, and also treated w/ Tamiflu for 5 days.  Despite these measures, her sx continued to worsen.    Significant Events: 2/17 admit   Subjective: The patient is seen on the hemodialysis unit.  She reports that her shortness of breath is beginning to improve.  She denies any chest pain at this time.  She denies abdominal pain nausea or vomiting.  Assessment & Plan:  Acute respiratory failure with hypoxia Due to PNA v/s viral/influenza pneumonitis - wean O2 support as possible   MRSA bacteremia  ID directing care - she will need her HD cath removed, and a TTE  ESRD on hemodialysis on Monday Wednesday Friday Nephrology attending to inpatient HD and will help coordinate removal of HD cath   Chronic Atrial fibrillation on Coumadin which will be dosed per pharmacy  Hypothyroidism Cont Synthroid  Chronic hypotensio cont Midrin  DM CBG currently well controlled   DVT prophylaxis: warfarin  Code Status: NO CODE - DNR Family Communication: no family present at time of exam  Disposition Plan: SDU  Consultants:  Nephrology  ID  Antimicrobials:  Aztreonam 2/17 > Vanc 2/17 >  Objective: Blood pressure (!) 112/53, pulse (!) 126, temperature 100 F (37.8 C), temperature source Oral, resp. rate 19, height 5\' 5"  (1.651 m), weight 90.7 kg (200 lb), SpO2 92 %.  Intake/Output Summary (Last 24 hours) at 05/22/2017 1845 Last data filed at 05/22/2017 1656 Gross per 24 hour  Intake 650 ml  Output 807 ml  Net -157 ml   Filed Weights   05/21/17 1657  Weight: 90.7 kg (200 lb)     Examination: General: No acute respiratory distress Lungs: fine diffuse crackles - no wheezing  Cardiovascular: tachycardic - no M or rub  Abdomen: Nontender, nondistended, soft, bowel sounds positive, no rebound, no ascites, no appreciable mass Extremities: trace B LE edema   CBC: Recent Labs  Lab 05/21/17 1750 05/22/17 0303  WBC 19.6* 17.0*  NEUTROABS 17.3*  --   HGB 13.6 12.7  HCT 42.6 39.2  MCV 98.6 97.3  PLT 216 867   Basic Metabolic Panel: Recent Labs  Lab 05/21/17 1750 05/22/17 0303  NA 138 135  K 4.0 3.8  CL 98* 97*  CO2 22 20*  GLUCOSE 128* 163*  BUN 54* 62*  CREATININE 7.80* 8.15*  CALCIUM 6.7* 6.3*   GFR: Estimated Creatinine Clearance: 6.4 mL/min (A) (by C-G formula based on SCr of 8.15 mg/dL (H)).  Liver Function Tests: Recent Labs  Lab 05/21/17 1750  AST 24  ALT 20  ALKPHOS 103  BILITOT 1.0  PROT 6.0*  ALBUMIN 3.1*    Coagulation Profile: Recent Labs  Lab 05/21/17 1750 05/22/17 0303  INR 4.43* 5.30*    Cardiac Enzymes: Recent Labs  Lab 05/21/17 2250 05/22/17 0303  TROPONINI 0.03* 0.03*    HbA1C: Hgb A1c MFr Bld  Date/Time Value Ref Range Status  04/12/2016 05:21 PM 5.4 4.8 - 5.6 % Final    Comment:    (NOTE)         Pre-diabetes: 5.7 - 6.4  Diabetes: >6.4         Glycemic control for adults with diabetes: <7.0   01/22/2016 10:06 AM 4.7 (L) 4.8 - 5.6 % Final    Comment:    (NOTE)         Pre-diabetes: 5.7 - 6.4         Diabetes: >6.4         Glycemic control for adults with diabetes: <7.0     CBG: Recent Labs  Lab 05/22/17 0022 05/22/17 0727 05/22/17 1209 05/22/17 1736  GLUCAP 196* 113* 102* 94    Recent Results (from the past 240 hour(s))  Blood Culture (routine x 2)     Status: None (Preliminary result)   Collection Time: 05/21/17  6:41 PM  Result Value Ref Range Status   Specimen Description BLOOD RIGHT FOREARM  Final   Special Requests   Final    BOTTLES DRAWN AEROBIC AND ANAEROBIC Blood  Culture adequate volume   Culture  Setup Time   Final    GRAM POSITIVE COCCI ANAEROBIC BOTTLE ONLY Organism ID to follow CRITICAL RESULT CALLED TO, READ BACK BY AND VERIFIED WITHEzekiel Slocumb PHARMD 1508 05/22/17 A BROWNING Performed at [facility], [place] [address], [place], [place] [address]    Culture GRAM POSITIVE COCCI  Final   Report Status PENDING  Incomplete  Blood Culture ID Panel (Reflexed)     Status: Abnormal   Collection Time: 05/21/17  6:41 PM  Result Value Ref Range Status   Enterococcus species NOT DETECTED NOT DETECTED Final   Listeria monocytogenes NOT DETECTED NOT DETECTED Final   Staphylococcus species DETECTED (A) NOT DETECTED Final    Comment: CRITICAL RESULT CALLED TO, READ BACK BY AND VERIFIED WITH: E SINCLAIR PHARMD 1508 05/22/17 A BROWNING    Staphylococcus aureus DETECTED (A) NOT DETECTED Final    Comment: Methicillin (oxacillin)-resistant Staphylococcus aureus (MRSA). MRSA is predictably resistant to beta-lactam antibiotics (except ceftaroline). Preferred therapy is vancomycin unless clinically contraindicated. Patient requires contact precautions if  hospitalized. CRITICAL RESULT CALLED TO, READ BACK BY AND VERIFIED WITH: E SINCLAIR PHARMD 1508 05/22/17 A BROWNING    Methicillin resistance DETECTED (A) NOT DETECTED Final    Comment: CRITICAL RESULT CALLED TO, READ BACK BY AND VERIFIED WITH: E SINCLAIR PHARMD 1508 05/22/17 A BROWNING    Streptococcus species NOT DETECTED NOT DETECTED Final   Streptococcus agalactiae NOT DETECTED NOT DETECTED Final   Streptococcus pneumoniae NOT DETECTED NOT DETECTED Final   Streptococcus pyogenes NOT DETECTED NOT DETECTED Final   Acinetobacter baumannii NOT DETECTED NOT DETECTED Final   Enterobacteriaceae species NOT DETECTED NOT DETECTED Final   Enterobacter cloacae complex NOT DETECTED NOT DETECTED Final   Escherichia coli NOT DETECTED NOT DETECTED Final   Klebsiella oxytoca NOT DETECTED NOT DETECTED Final    Klebsiella pneumoniae NOT DETECTED NOT DETECTED Final   Proteus species NOT DETECTED NOT DETECTED Final   Serratia marcescens NOT DETECTED NOT DETECTED Final   Haemophilus influenzae NOT DETECTED NOT DETECTED Final   Neisseria meningitidis NOT DETECTED NOT DETECTED Final   Pseudomonas aeruginosa NOT DETECTED NOT DETECTED Final   Candida albicans NOT DETECTED NOT DETECTED Final   Candida glabrata NOT DETECTED NOT DETECTED Final   Candida krusei NOT DETECTED NOT DETECTED Final   Candida parapsilosis NOT DETECTED NOT DETECTED Final   Candida tropicalis NOT DETECTED NOT DETECTED Final    Comment: Performed at [facility], [place]. [address] [place], [place] [address]  Blood Culture (routine  x 2)     Status: None (Preliminary result)   Collection Time: 05/21/17  6:46 PM  Result Value Ref Range Status   Specimen Description BLOOD LEFT ANTECUBITAL  Final   Special Requests   Final    BOTTLES DRAWN AEROBIC AND ANAEROBIC Blood Culture adequate volume   Culture   Final    NO GROWTH < 24 HOURS Performed at [facility], [address] [place], [place] [address]    Report Status PENDING  Incomplete  Culture, expectorated sputum-assessment     Status: None   Collection Time: 05/21/17  7:03 PM  Result Value Ref Range Status   Specimen Description SPUTUM  Final   Special Requests NONE  Final   Sputum evaluation   Final    THIS SPECIMEN IS ACCEPTABLE FOR SPUTUM CULTURE Performed at [facility], [address] [place], [place] [address]    Report Status 05/22/2017 FINAL  Final  Culture, respiratory (NON-Expectorated)     Status: None (Preliminary result)   Collection Time: 05/21/17  7:03 PM  Result Value Ref Range Status   Specimen Description SPUTUM  Final   Special Requests NONE Reflexed from [id number]  Final   Gram Stain   Final    FEW WBC PRESENT, PREDOMINANTLY PMN ABUNDANT GRAM POSITIVE COCCI IN CLUSTERS MODERATE GRAM NEGATIVE RODS FEW YEAST Performed at [facility], [address], [place], [place] [address]    Culture PENDING  Incomplete   Report Status PENDING  Incomplete     Scheduled Meds: . albuterol  2.5 mg Nebulization Q4H  . albuterol      . budesonide (PULMICORT) nebulizer solution  0.25 mg Nebulization BID  . cinacalcet  30 mg Oral QODAY  . citalopram  10 mg Oral BID  . doxercalciferol      . doxercalciferol  1 mcg Intravenous Q M,W,F-HD  . ezetimibe  10 mg Oral Daily  . [START ON 05/23/2017] feeding supplement (NEPRO CARB STEADY)  237 mL Oral BID BM  . ferric citrate  420 mg Oral TID AC  . gabapentin  100 mg Oral QHS  . insulin aspart  0-9 Units Subcutaneous TID WC  . ipratropium  0.5 mg Nebulization Q4H  . levothyroxine  100 mcg Oral QAC breakfast  . midodrine      . midodrine  10 mg Oral TID WC  . multivitamin  1 tablet Oral Daily  . pantoprazole  40 mg Oral Daily  . Warfarin - Pharmacist Dosing Inpatient   Does not apply q1800     LOS: 1 day   Cherene Altes, MD [facility] Office  [phone] Pager - Text Page per Amion as per below:  On-Call/Text Page:      [email or web address]      password [email or web address]  If 7PM-7AM, please contact night-coverage [email or web address] Password [facility] 05/22/2017, 6:45 PM

## 2017-05-22 NOTE — Progress Notes (Signed)
PHARMACY - PHYSICIAN COMMUNICATION CRITICAL VALUE ALERT - BLOOD CULTURE IDENTIFICATION (BCID)  Results for orders placed or performed during the hospital encounter of 05/21/17  Blood Culture ID Panel (Reflexed) (Collected: 05/21/2017  6:41 PM)  Result Value Ref Range   Enterococcus species NOT DETECTED NOT DETECTED   Listeria monocytogenes NOT DETECTED NOT DETECTED   Staphylococcus species DETECTED (A) NOT DETECTED   Staphylococcus aureus DETECTED (A) NOT DETECTED   Methicillin resistance DETECTED (A) NOT DETECTED   Streptococcus species NOT DETECTED NOT DETECTED   Streptococcus agalactiae NOT DETECTED NOT DETECTED   Streptococcus pneumoniae NOT DETECTED NOT DETECTED   Streptococcus pyogenes NOT DETECTED NOT DETECTED   Acinetobacter baumannii NOT DETECTED NOT DETECTED   Enterobacteriaceae species NOT DETECTED NOT DETECTED   Enterobacter cloacae complex NOT DETECTED NOT DETECTED   Escherichia coli NOT DETECTED NOT DETECTED   Klebsiella oxytoca NOT DETECTED NOT DETECTED   Klebsiella pneumoniae NOT DETECTED NOT DETECTED   Proteus species NOT DETECTED NOT DETECTED   Serratia marcescens NOT DETECTED NOT DETECTED   Haemophilus influenzae NOT DETECTED NOT DETECTED   Neisseria meningitidis NOT DETECTED NOT DETECTED   Pseudomonas aeruginosa NOT DETECTED NOT DETECTED   Candida albicans NOT DETECTED NOT DETECTED   Candida glabrata NOT DETECTED NOT DETECTED   Candida krusei NOT DETECTED NOT DETECTED   Candida parapsilosis NOT DETECTED NOT DETECTED   Candida tropicalis NOT DETECTED NOT DETECTED    Name of physician (or Provider) Contacted: McClung  Changes to prescribed antibiotics required: Add vancomycin   Jimmy Footman, PharmD, BCPS PGY2 Infectious Diseases Pharmacy Resident Pager: [phone]  05/22/2017  3:13 PM

## 2017-05-23 ENCOUNTER — Encounter (HOSPITAL_COMMUNITY): Payer: Self-pay | Admitting: General Practice

## 2017-05-23 ENCOUNTER — Inpatient Hospital Stay (HOSPITAL_COMMUNITY): Payer: Medicare Other

## 2017-05-23 DIAGNOSIS — I361 Nonrheumatic tricuspid (valve) insufficiency: Secondary | ICD-10-CM

## 2017-05-23 LAB — GLUCOSE, CAPILLARY
Glucose-Capillary: 98 mg/dL (ref 65–99)
Glucose-Capillary: 144 mg/dL — ABNORMAL HIGH (ref 65–99)
Glucose-Capillary: 137 mg/dL — ABNORMAL HIGH (ref 65–99)
Glucose-Capillary: 115 mg/dL — ABNORMAL HIGH (ref 65–99)

## 2017-05-23 LAB — PROTIME-INR
INR: 4.39
Prothrombin Time: 41.6 seconds — ABNORMAL HIGH (ref 11.4–15.2)

## 2017-05-23 LAB — ECHOCARDIOGRAM COMPLETE
Height: 65 in
Weight: 3068.8 oz

## 2017-05-23 MED ORDER — LEVALBUTEROL HCL 0.63 MG/3ML IN NEBU: 0.6300 mg | INHALATION_SOLUTION | RESPIRATORY_TRACT | Status: DC | PRN

## 2017-05-23 MED ORDER — IPRATROPIUM BROMIDE 0.02 % IN SOLN
0.5000 mg | Freq: Three times a day (TID) | RESPIRATORY_TRACT | Status: DC
  Administered 2017-05-23 – 2017-05-24 (×4): 0.5 mg via RESPIRATORY_TRACT
  Filled 2017-05-23 (×4): qty 2.5

## 2017-05-23 MED ORDER — IPRATROPIUM BROMIDE 0.02 % IN SOLN: 0.5000 mg | Freq: Three times a day (TID) | RESPIRATORY_TRACT | Status: DC

## 2017-05-23 MED ORDER — SODIUM CHLORIDE 0.9 % IV BOLUS (SEPSIS)
250.0000 mL | Freq: Once | INTRAVENOUS | Status: AC
  Administered 2017-05-23: 250 mL via INTRAVENOUS

## 2017-05-23 MED ORDER — VANCOMYCIN HCL IN DEXTROSE 1-5 GM/200ML-% IV SOLN
1000.0000 mg | INTRAVENOUS | Status: DC
  Administered 2017-05-24: 1 g via INTRAVENOUS
  Filled 2017-05-23 (×2): qty 200

## 2017-05-23 MED ORDER — LEVALBUTEROL HCL 0.63 MG/3ML IN NEBU: 0.6300 mg | INHALATION_SOLUTION | Freq: Three times a day (TID) | RESPIRATORY_TRACT | Status: DC

## 2017-05-23 MED ORDER — LEVALBUTEROL HCL 0.63 MG/3ML IN NEBU
0.6300 mg | INHALATION_SOLUTION | Freq: Three times a day (TID) | RESPIRATORY_TRACT | Status: DC
  Administered 2017-05-23 – 2017-05-24 (×4): 0.63 mg via RESPIRATORY_TRACT
  Filled 2017-05-23 (×4): qty 3

## 2017-05-23 MED ORDER — MIDODRINE HCL 5 MG PO TABS
10.0000 mg | ORAL_TABLET | ORAL | Status: DC
  Administered 2017-05-24 – 2017-05-26 (×2): 10 mg via ORAL
  Filled 2017-05-23 (×2): qty 2

## 2017-05-23 NOTE — Progress Notes (Signed)
[facility] for Infectious Disease    Date of Admission:  05/21/2017   Total days of antibiotics 2          ID: [name] is a 77 y.o. female with ESRD found to have flu,with secondary MRSA bacteremia/pna with likely HD line involvement Principal Problem:   Acute respiratory failure with hypoxia ([place]) Active Problems:   Hypothyroidism   Anemia   Atrial fibrillation ([place])    Subjective: Febrile, still feels poorly, with hacking cough. Her family reports that her HD line will be removed after HD tomorrow  Medications:  . budesonide (PULMICORT) nebulizer solution  0.25 mg Nebulization BID  . cinacalcet  30 mg Oral QODAY  . citalopram  10 mg Oral BID  . doxercalciferol  1 mcg Intravenous Q M,W,F-HD  . ezetimibe  10 mg Oral Daily  . feeding supplement (NEPRO CARB STEADY)  237 mL Oral BID BM  . ferric citrate  420 mg Oral TID AC  . gabapentin  100 mg Oral QHS  . insulin aspart  0-9 Units Subcutaneous TID WC  . ipratropium  0.5 mg Nebulization Q8H  . levalbuterol  0.63 mg Nebulization Q8H  . levothyroxine  100 mcg Oral QAC breakfast  . [START ON 05/24/2017] midodrine  10 mg Oral Q M,W,F-HD  . multivitamin  1 tablet Oral Daily  . pantoprazole  40 mg Oral Daily  . Warfarin - Pharmacist Dosing Inpatient   Does not apply q1800    Objective: Vital signs in last 24 hours: Temp:  [97.9 F (36.6 C)-100.3 F (37.9 C)] 100.3 F (37.9 C) (02/19 2100) Pulse Rate:  [111-124] 121 (02/19 2100) Resp:  [18-20] 20 (02/19 2100) BP: (82-131)/(59-91) 116/74 (02/19 2100) SpO2:  [93 %-98 %] 96 % (02/19 2156) Weight:  [191 lb 12.8 oz (87 kg)] 191 lb 12.8 oz (87 kg) (02/19 0500) Physical Exam  Constitutional:  oriented to person, place, and time. appears well-developed and well-nourished. No distress.  HENT: [place]/AT, PERRLA, no scleral icterus Mouth/Throat: Oropharynx is clear and moist. No oropharyngeal exudate.  Cardiovascular: Normal rate, regular rhythm and normal heart sounds. Exam  reveals no gallop and no friction rub.  No murmur heard.  Pulmonary/Chest: Effort normal but diffuse rhonchi still heard on exam Chest wall -right HD catheter has no surrounding erythema Neck = supple, no nuchal rigidity Abdominal: Soft. Bowel sounds are normal.  exhibits no distension. There is no tenderness.  Lymphadenopathy: no cervical adenopathy. No axillary adenopathy Neurological: alert and oriented to person, place, and time.  Skin: Skin is warm and dry. No rash noted. No erythema.  Psychiatric: a normal mood and affect.  behavior is normal.    Lab Results Recent Labs    05/21/17 1750 05/22/17 0303  WBC 19.6* 17.0*  HGB 13.6 12.7  HCT 42.6 39.2  NA 138 135  K 4.0 3.8  CL 98* 97*  CO2 22 20*  BUN 54* 62*  CREATININE 7.80* 8.15*   Liver Panel Recent Labs    05/21/17 1750  PROT 6.0*  ALBUMIN 3.1*  AST 24  ALT 20  ALKPHOS 103  BILITOT 1.0    Microbiology: Blood cx -MRSA in 1 of 4 bottles Sputum cx - staph aureus identified but sensitivities are pending Studies/Results: Ct Angio Chest Pe W Or Wo Contrast  Result Date: 05/22/2017 CLINICAL DATA:  Respiratory distress with tachycardia right-sided chest pain EXAM: CT ANGIOGRAPHY CHEST WITH CONTRAST TECHNIQUE: Multidetector CT imaging of the chest was performed using the standard  protocol during bolus administration of intravenous contrast. Multiplanar CT image reconstructions and MIPs were obtained to evaluate the vascular anatomy. CONTRAST:  144mL ISOVUE-370 IOPAMIDOL (ISOVUE-370) INJECTION 76% COMPARISON:  Radiograph 05/21/2017 FINDINGS: Cardiovascular: Satisfactory opacification of the pulmonary arteries to the segmental level. No evidence of pulmonary embolism. Upper lobe evaluation is limited by streak artifact from metal in the shoulders, particularly on the right side. Nonaneurysmal aorta. Moderate aortic atherosclerosis. Coronary artery calcification. Mild cardiomegaly. No significant pericardial effusion.  Mediastinum/Nodes: Midline trachea. No thyroid mass. Enlarged mediastinal lymph nodes, prevascular lymph node measures 11 mm. Subcarinal lymph node measures 16 mm.Esophagus within normal limits. Lungs/Pleura: Small right-sided pleural effusion and trace left pleural effusion. Partial consolidation within the right greater than left lung bases. Partial consolidation in the right middle lobe. Negative for a pneumothorax. Upper Abdomen: No acute abnormality. Musculoskeletal: Mild kyphosis. Degenerative changes. No acute or suspicious lesion Review of the MIP images confirms the above findings. IMPRESSION: 1. Suboptimal evaluation of the upper lobe vessels due to streak artifact from metallic hardware in the shoulders. No definite acute central embolus is seen 2. Small right greater than left pleural effusion. Partial consolidation within the bilateral lower lobes and the right middle lobe may reflect atelectasis or pneumonia 3. Enlarged mediastinal lymph nodes, possibly reactive 4. Cardiomegaly Aortic Atherosclerosis (ICD10-I70.0). Electronically Signed   By: Donavan Foil M.D.   On: 05/22/2017 00:57     Assessment/Plan: 77yo F with ESRD having influenza complication with secondary MRSA pneumonia and bacteremia  - repeat blood cx after HD removal tomorrow - await TTE results to see if need to get TEE - continue with vancomycin post HD - echo findings will likely dictate length of treatment course - ideally would like 48hr line holiday before new catheter placed if possible   [facility] for Infectious Diseases Cell: [phone] Pager: [phone]  05/23/2017, 10:27 PM

## 2017-05-23 NOTE — Progress Notes (Signed)
  Echocardiogram 2D Echocardiogram has been performed.  [name] [name] 05/23/2017, 5:17 PM

## 2017-05-23 NOTE — Progress Notes (Signed)
ANTICOAGULATION CONSULT NOTE - Initial Consult  Pharmacy Consult for Warfarin Indication: chest pain/ACS and atrial fibrillation  Allergies  Allergen Reactions  . Amoxicillin Rash    Has patient had a PCN reaction causing immediate rash, facial/tongue/throat swelling, SOB or lightheadedness with hypotension:YES Has patient had a PCN reaction causing severe rash involving mucus membranes or skin necrosis: Yes Has patient had a PCN reaction that required hospitalization No Has patient had a PCN reaction occurring within the last 10 years: Yes If all of the above answers are "NO", then may proceed with Cephalosporin use.   . Sulfa Drugs Cross Reactors Hives and Itching  . Percocet [Oxycodone-Acetaminophen] Itching and Rash    Did not happen last time she took it  . Sulfa Antibiotics Hives    Patient Measurements: Height: 5' 5"  (165.1 cm) Weight: 191 lb 12.8 oz (87 kg) IBW/kg (Calculated) : 57  Vital Signs: Temp: 98.4 F (36.9 C) (02/19 0738) Temp Source: Oral (02/19 0738) BP: 98/75 (02/19 0738) Pulse Rate: 118 (02/19 0738)  Labs: Recent Labs    05/21/17 1750 05/21/17 2250 05/22/17 0303 05/22/17 1738 05/23/17 0349  HGB 13.6  --  12.7  --   --   HCT 42.6  --  39.2  --   --   PLT 216  --  198  --   --   LABPROT 41.9*  --  48.2*  --  41.6*  INR 4.43*  --  5.30*  --  4.39*  CREATININE 7.80*  --  8.15*  --   --   TROPONINI  --  0.03* 0.03* 0.26*  --     Estimated Creatinine Clearance: 6.3 mL/min (A) (by C-G formula based on SCr of 8.15 mg/dL (H)).   Medical History: Past Medical History:  Diagnosis Date  . A-fib ([place])   . Acute respiratory failure ([place]) 05/2017  . Anemia   . Arthritis   . Diabetes mellitus    "diet controlled"  . Diabetes mellitus without complication ([place])   . ESRD (end stage renal disease) ([place])    dialysis MWF  . GERD (gastroesophageal reflux disease)   . Gout   . History of blood transfusion   . HLD (hyperlipidemia)   . HOH (hard of  hearing)    left ear  . Hypertension    hypotensive -since starting dialysis  . Hypothyroidism   . PAF (paroxysmal atrial fibrillation) ([place])    a. Echo 11/16:  Mild LVH, EF 55-60%, normal wall motion, MAC, mild MR, severe LAE (49 ml/m2), mild RVE, normal RVSF, mild RAE, mild TR, PASP 24 mmHg;  CHADS2-VASc: 4 >> Coumadin followed by PCP  . Renal disorder     Assessment: 77 year old female on warfarin prior to admission INR elevated  Goal of Therapy:  INR 2-3 Monitor platelets by anticoagulation protocol: Yes   Plan:  Hold warfarin for now Daily INR  Thank you Anette Guarneri, PharmD [phone] 05/23/2017,8:31 AM

## 2017-05-23 NOTE — Progress Notes (Signed)
[facility] Progress Note   Subjective: Says she feels better today, slept well. Denies SOB on nasal cannula, still C/O pain R chest when taking deep breath. HR elevated 116-120s.   Objective Vitals:   05/23/17 0738 05/23/17 1134 05/23/17 1145 05/23/17 1147  BP: 98/75 (!) 131/91    Pulse: (!) 118 (!) 117    Resp: 20 18    Temp: 98.4 F (36.9 C) 100.1 F (37.8 C)    TempSrc: Oral Axillary    SpO2: 93% 96% 94% 95%  Weight:      Height:       Physical Exam General: Chronically ill appearing female in NAD Heart: S1,S2, Tachy.  Lungs: Bilateral breath sounds sl decreased in bases. Scattered coarse breath sounds throughout lung fields. No Wheezing, no WOB.  Abdomen: Active BS.  Extremities: No edema.  Dialysis Access: [facility] drsg CDI.    Additional Objective Labs: Basic Metabolic Panel: Recent Labs  Lab 05/21/17 1750 05/22/17 0303  NA 138 135  K 4.0 3.8  CL 98* 97*  CO2 22 20*  GLUCOSE 128* 163*  BUN 54* 62*  CREATININE 7.80* 8.15*  CALCIUM 6.7* 6.3*   Liver Function Tests: Recent Labs  Lab 05/21/17 1750  AST 24  ALT 20  ALKPHOS 103  BILITOT 1.0  PROT 6.0*  ALBUMIN 3.1*   No results for input(s): LIPASE, AMYLASE in the last 168 hours. CBC: Recent Labs  Lab 05/21/17 1750 05/22/17 0303  WBC 19.6* 17.0*  NEUTROABS 17.3*  --   HGB 13.6 12.7  HCT 42.6 39.2  MCV 98.6 97.3  PLT 216 198   Blood Culture    Component Value Date/Time   SDES SPUTUM 05/21/2017 1903   SDES SPUTUM 05/21/2017 1903   SPECREQUEST NONE 05/21/2017 1903   SPECREQUEST NONE Reflexed from [id number] 05/21/2017 1903   CULT  05/21/2017 1903    ABUNDANT STAPHYLOCOCCUS AUREUS SUSCEPTIBILITIES TO FOLLOW Performed at [facility], [place] [address] [place], [place] [address]    REPTSTATUS 05/22/2017 FINAL 05/21/2017 1903   REPTSTATUS PENDING 05/21/2017 1903    Cardiac Enzymes: Recent Labs  Lab 05/21/17 2250 05/22/17 0303 05/22/17 1738  TROPONINI 0.03* 0.03* 0.26*    CBG: Recent Labs  Lab 05/22/17 1209 05/22/17 1736 05/22/17 2155 05/23/17 0737 05/23/17 1132  GLUCAP 102* 94 166* 98 144*   Iron Studies: No results for input(s): IRON, TIBC, TRANSFERRIN, FERRITIN in the last 72 hours. @lablastinr3 @ Studies/Results: Ct Angio Chest Pe W Or Wo Contrast  Result Date: 05/22/2017 CLINICAL DATA:  Respiratory distress with tachycardia right-sided chest pain EXAM: CT ANGIOGRAPHY CHEST WITH CONTRAST TECHNIQUE: Multidetector CT imaging of the chest was performed using the standard protocol during bolus administration of intravenous contrast. Multiplanar CT image reconstructions and MIPs were obtained to evaluate the vascular anatomy. CONTRAST:  142mL ISOVUE-370 IOPAMIDOL (ISOVUE-370) INJECTION 76% COMPARISON:  Radiograph 05/21/2017 FINDINGS: Cardiovascular: Satisfactory opacification of the pulmonary arteries to the segmental level. No evidence of pulmonary embolism. Upper lobe evaluation is limited by streak artifact from metal in the shoulders, particularly on the right side. Nonaneurysmal aorta. Moderate aortic atherosclerosis. Coronary artery calcification. Mild cardiomegaly. No significant pericardial effusion. Mediastinum/Nodes: Midline trachea. No thyroid mass. Enlarged mediastinal lymph nodes, prevascular lymph node measures 11 mm. Subcarinal lymph node measures 16 mm.Esophagus within normal limits. Lungs/Pleura: Small right-sided pleural effusion and trace left pleural effusion. Partial consolidation within the right greater than left lung bases. Partial consolidation in the right middle lobe. Negative for a pneumothorax. Upper Abdomen:  No acute abnormality. Musculoskeletal: Mild kyphosis. Degenerative changes. No acute or suspicious lesion Review of the MIP images confirms the above findings. IMPRESSION: 1. Suboptimal evaluation of the upper lobe vessels due to streak artifact from metallic hardware in the shoulders. No definite acute central embolus is seen 2.  Small right greater than left pleural effusion. Partial consolidation within the bilateral lower lobes and the right middle lobe may reflect atelectasis or pneumonia 3. Enlarged mediastinal lymph nodes, possibly reactive 4. Cardiomegaly Aortic Atherosclerosis (ICD10-I70.0). Electronically Signed   By: Donavan Foil M.D.   On: 05/22/2017 00:57   Dg Chest Port 1 View  Result Date: 05/21/2017 CLINICAL DATA:  Cough EXAM: PORTABLE CHEST 1 VIEW COMPARISON:  04/13/2016 chest radiograph. FINDINGS: Bilateral total shoulder arthroplasty is partially visualized. Right internal jugular central venous catheter terminates at the cavoatrial junction. Stable cardiomediastinal silhouette with mild cardiomegaly. No pneumothorax. No pleural effusion. No pulmonary edema. Low lung volumes with mild bibasilar atelectasis. IMPRESSION: Low lung volumes with mild bibasilar atelectasis. Stable cardiomegaly without pulmonary edema. Electronically Signed   By: Ilona Sorrel M.D.   On: 05/21/2017 19:20   Medications: . [START ON 05/24/2017] vancomycin     . budesonide (PULMICORT) nebulizer solution  0.25 mg Nebulization BID  . cinacalcet  30 mg Oral QODAY  . citalopram  10 mg Oral BID  . doxercalciferol  1 mcg Intravenous Q M,W,F-HD  . ezetimibe  10 mg Oral Daily  . feeding supplement (NEPRO CARB STEADY)  237 mL Oral BID BM  . ferric citrate  420 mg Oral TID AC  . gabapentin  100 mg Oral QHS  . insulin aspart  0-9 Units Subcutaneous TID WC  . ipratropium-albuterol  3 mL Nebulization TID  . levothyroxine  100 mcg Oral QAC breakfast  . midodrine  10 mg Oral TID WC  . multivitamin  1 tablet Oral Daily  . pantoprazole  40 mg Oral Daily  . Warfarin - Pharmacist Dosing Inpatient   Does not apply q1800    Dialysis Orders: [facility] MWF 4 hrs 160 NRe 350/800 manual 89 Kg 2.0 K/2.5 Ca  UFP 4 linear sodium -No heparin -Mircera 100 mcg IV q 2 weeks (last dose 05/08/17 HGB 12.7 05/17/17) -Venofer 50 mg IV weekly (last dose  05/15/17) -Hectorol 1 mcg IV TIW  Assessment/Plan: 1.  Acute respiratory failure: Possibly due to PNA vs viral influenza pneumonitis. Per primary/ID.  2.  MRSA Bacteremia: Seen by ID. On Vanc. Recommending removing TDC and line holiday. Will have HD tomorrow 1st shift, ask IR to remove [facility] after HD, and replace with new TDC on Friday.   3.  ESRD -  MWF-HD tomorrow 1st shift.  No heparin. K+ 3.8 4.0 K bath. Recheck labs in HD.  4.  Hypertension/volume -BP elevated. now on midodrine 10 mg PO TID. Midodrine was being held at HD unit due to hypertension. Decrease midodrine to 10 mg TIW. HD 02/18 Net UF 807 Post wt 87 kg. Now 2 kgs under OP EDW now with elevated HR. Concerned she may be dry. Will give NS 250CC IVF bolus, see if HR comes down.  5.  Anemia  - HGB 12.7 No ESA needed.  6.  Metabolic bone disease -  Continue binders, VDRA, sensipar.  7.  Nutrition - Albumin 3.1 Renal/Carb. Renal vit nepro.  8.  H/O Afib: Coumadin on hold. INR today 4.39. Per pharmacy.  9.  DM: per primary    Rita H. Brown NP-C 05/23/2017, 12:40 PM  [facility] [phone]  Pt seen, examined and agree w A/P as above.  Kelly Splinter MD [employer] pager [phone]   05/23/2017, 2:45 PM

## 2017-05-23 NOTE — Progress Notes (Signed)
[place] TEAM 1 - Stepdown/ICU TEAM  [name]  JKD:[id number] DOB: [birth year] DOA: 05/21/2017 PCP: Dione Housekeeper, MD    Brief Narrative:  77 y.o. female with a hx of ESRD on hemodialysis, atrial fibrillation, hypothyroidism, and DM who was brought to the ER w/ persistent shortness of breath and right-sided chest pain.  She was started on antibiotics and prednisone on February 11 by her PCP, and also treated w/ Tamiflu for 5 days.  Despite these measures, her sx continued to worsen.    Significant Events: 2/17 admit   Subjective: The pt tells me she feels "crummy in general."  She also reports some pleuritic type R lower chest wall pain.  She denies n/v, abdom pain, SSCP, or HA.  She has been intermittently tachycardic over the last 12hrs.    Assessment & Plan:  Acute respiratory failure with hypoxia - Staph aureus PNA resp cx now growing abundant Staph aureus - assume this is MRSA, and source of bacteremia - cont current abx and f/u sensitivities   MRSA bacteremia 91 of 2 blood cx +) ID directing care - she will need her HD cath removed, and a TTE - these plans are in place   Tachycardia Appears to be sinus tachy - gently hydrate as pt is modestly hypotensive and below her EDW - likely also being driven by her acute infection - TSH is normal   ESRD on hemodialysis on Monday Wednesday Friday Nephrology attending to inpatient HD and will help coordinate removal of HD cath   [facility] Weights   05/21/17 1657 05/23/17 0500  Weight: 90.7 kg (200 lb) 87 kg (191 lb 12.8 oz)     Chronic Atrial fibrillation on Coumadin which will be dosed per pharmacy - INR supratherapeutic presently   Hypothyroidism Cont Synthroid  Chronic hypotension cont Midrin only w/ HD   DM CBG currently well controlled   DVT prophylaxis: warfarin  Code Status: NO CODE - DNR Family Communication: spoke w/ daugher-in-law at bedside  Disposition Plan: SDU  Consultants:  Nephrology   ID  Antimicrobials:  Aztreonam 2/17 > 2/18 Vanc 2/17 >  Objective: Blood pressure (!) 131/91, pulse (!) 117, temperature 100.1 F (37.8 C), temperature source Axillary, resp. rate 18, height 5\' 5"  (1.651 m), weight 87 kg (191 lb 12.8 oz), SpO2 95 %.  Intake/Output Summary (Last 24 hours) at 05/23/2017 1404 Last data filed at 05/23/2017 1100 Gross per 24 hour  Intake 670 ml  Output 1008 ml  Net -338 ml   Filed Weights   05/21/17 1657 05/23/17 0500  Weight: 90.7 kg (200 lb) 87 kg (191 lb 12.8 oz)    Examination: General: No acute respiratory distress Lungs: bibasilar crackles R >L - no wheezing   Cardiovascular: tachycardic but regular - no M or rub  Abdomen: Nontender, nondistended, soft, bowel sounds positive, no rebound, no ascites, no appreciable mass Extremities: no signif LE edema   CBC: Recent Labs  Lab 05/21/17 1750 05/22/17 0303  WBC 19.6* 17.0*  NEUTROABS 17.3*  --   HGB 13.6 12.7  HCT 42.6 39.2  MCV 98.6 97.3  PLT 216 099   Basic Metabolic Panel: Recent Labs  Lab 05/21/17 1750 05/22/17 0303  NA 138 135  K 4.0 3.8  CL 98* 97*  CO2 22 20*  GLUCOSE 128* 163*  BUN 54* 62*  CREATININE 7.80* 8.15*  CALCIUM 6.7* 6.3*   GFR: Estimated Creatinine Clearance: 6.3 mL/min (A) (by C-G formula based on SCr of 8.15 mg/dL (H)).  Liver Function Tests: Recent Labs  Lab 05/21/17 1750  AST 24  ALT 20  ALKPHOS 103  BILITOT 1.0  PROT 6.0*  ALBUMIN 3.1*    Coagulation Profile: Recent Labs  Lab 05/21/17 1750 05/22/17 0303 05/23/17 0349  INR 4.43* 5.30* 4.39*    Cardiac Enzymes: Recent Labs  Lab 05/21/17 2250 05/22/17 0303 05/22/17 1738  TROPONINI 0.03* 0.03* 0.26*    HbA1C: Hgb A1c MFr Bld  Date/Time Value Ref Range Status  04/12/2016 05:21 PM 5.4 4.8 - 5.6 % Final    Comment:    (NOTE)         Pre-diabetes: 5.7 - 6.4         Diabetes: >6.4         Glycemic control for adults with diabetes: <7.0   01/22/2016 10:06 AM 4.7 (L) 4.8 - 5.6  % Final    Comment:    (NOTE)         Pre-diabetes: 5.7 - 6.4         Diabetes: >6.4         Glycemic control for adults with diabetes: <7.0     CBG: Recent Labs  Lab 05/22/17 1209 05/22/17 1736 05/22/17 2155 05/23/17 0737 05/23/17 1132  GLUCAP 102* 94 166* 98 144*    Recent Results (from the past 240 hour(s))  Blood Culture (routine x 2)     Status: Abnormal (Preliminary result)   Collection Time: 05/21/17  6:41 PM  Result Value Ref Range Status   Specimen Description BLOOD RIGHT FOREARM  Final   Special Requests   Final    BOTTLES DRAWN AEROBIC AND ANAEROBIC Blood Culture adequate volume   Culture  Setup Time   Final    GRAM POSITIVE COCCI ANAEROBIC BOTTLE ONLY CRITICAL RESULT CALLED TO, READ BACK BY AND VERIFIED WITH: E SINCLAIR PHARMD 1508 05/22/17 A BROWNING    Culture (A)  Final    STAPHYLOCOCCUS AUREUS SUSCEPTIBILITIES TO FOLLOW Performed at [facility], [place] [address] [place], [place] [address]    Report Status PENDING  Incomplete  Blood Culture ID Panel (Reflexed)     Status: Abnormal   Collection Time: 05/21/17  6:41 PM  Result Value Ref Range Status   Enterococcus species NOT DETECTED NOT DETECTED Final   Listeria monocytogenes NOT DETECTED NOT DETECTED Final   Staphylococcus species DETECTED (A) NOT DETECTED Final    Comment: CRITICAL RESULT CALLED TO, READ BACK BY AND VERIFIED WITH: E SINCLAIR PHARMD 1508 05/22/17 A BROWNING    Staphylococcus aureus DETECTED (A) NOT DETECTED Final    Comment: Methicillin (oxacillin)-resistant Staphylococcus aureus (MRSA). MRSA is predictably resistant to beta-lactam antibiotics (except ceftaroline). Preferred therapy is vancomycin unless clinically contraindicated. Patient requires contact precautions if  hospitalized. CRITICAL RESULT CALLED TO, READ BACK BY AND VERIFIED WITH: E SINCLAIR PHARMD 1508 05/22/17 A BROWNING    Methicillin resistance DETECTED (A) NOT DETECTED Final    Comment: CRITICAL RESULT CALLED  TO, READ BACK BY AND VERIFIED WITH: E SINCLAIR PHARMD 1508 05/22/17 A BROWNING    Streptococcus species NOT DETECTED NOT DETECTED Final   Streptococcus agalactiae NOT DETECTED NOT DETECTED Final   Streptococcus pneumoniae NOT DETECTED NOT DETECTED Final   Streptococcus pyogenes NOT DETECTED NOT DETECTED Final   Acinetobacter baumannii NOT DETECTED NOT DETECTED Final   Enterobacteriaceae species NOT DETECTED NOT DETECTED Final   Enterobacter cloacae complex NOT DETECTED NOT DETECTED Final   Escherichia coli NOT DETECTED NOT DETECTED Final   Klebsiella oxytoca  NOT DETECTED NOT DETECTED Final   Klebsiella pneumoniae NOT DETECTED NOT DETECTED Final   Proteus species NOT DETECTED NOT DETECTED Final   Serratia marcescens NOT DETECTED NOT DETECTED Final   Haemophilus influenzae NOT DETECTED NOT DETECTED Final   Neisseria meningitidis NOT DETECTED NOT DETECTED Final   Pseudomonas aeruginosa NOT DETECTED NOT DETECTED Final   Candida albicans NOT DETECTED NOT DETECTED Final   Candida glabrata NOT DETECTED NOT DETECTED Final   Candida krusei NOT DETECTED NOT DETECTED Final   Candida parapsilosis NOT DETECTED NOT DETECTED Final   Candida tropicalis NOT DETECTED NOT DETECTED Final    Comment: Performed at [facility], [place] [address], [place], [place] [address]  Blood Culture (routine x 2)     Status: None (Preliminary result)   Collection Time: 05/21/17  6:46 PM  Result Value Ref Range Status   Specimen Description BLOOD LEFT ANTECUBITAL  Final   Special Requests   Final    BOTTLES DRAWN AEROBIC AND ANAEROBIC Blood Culture adequate volume   Culture   Final    NO GROWTH < 24 HOURS Performed at [facility], [place] [address] [place], [place] [address]    Report Status PENDING  Incomplete  Culture, expectorated sputum-assessment     Status: None   Collection Time: 05/21/17  7:03 PM  Result Value Ref Range Status   Specimen Description SPUTUM  Final   Special Requests NONE  Final    Sputum evaluation   Final    THIS SPECIMEN IS ACCEPTABLE FOR SPUTUM CULTURE Performed at [facility], [address], [place], [place] [address]    Report Status 05/22/2017 FINAL  Final  Culture, respiratory (NON-Expectorated)     Status: None (Preliminary result)   Collection Time: 05/21/17  7:03 PM  Result Value Ref Range Status   Specimen Description SPUTUM  Final   Special Requests NONE Reflexed from [id number]  Final   Gram Stain   Final    FEW WBC PRESENT, PREDOMINANTLY PMN ABUNDANT GRAM POSITIVE COCCI IN CLUSTERS MODERATE GRAM NEGATIVE RODS FEW YEAST    Culture   Final    ABUNDANT STAPHYLOCOCCUS AUREUS SUSCEPTIBILITIES TO FOLLOW Performed at [facility], [place] [address], [place], [place] [address]    Report Status PENDING  Incomplete     Scheduled Meds: . budesonide (PULMICORT) nebulizer solution  0.25 mg Nebulization BID  . cinacalcet  30 mg Oral QODAY  . citalopram  10 mg Oral BID  . doxercalciferol  1 mcg Intravenous Q M,W,F-HD  . ezetimibe  10 mg Oral Daily  . feeding supplement (NEPRO CARB STEADY)  237 mL Oral BID BM  . ferric citrate  420 mg Oral TID AC  . gabapentin  100 mg Oral QHS  . insulin aspart  0-9 Units Subcutaneous TID WC  . ipratropium-albuterol  3 mL Nebulization TID  . levothyroxine  100 mcg Oral QAC breakfast  . [START ON 05/24/2017] midodrine  10 mg Oral Q M,W,F-HD  . multivitamin  1 tablet Oral Daily  . pantoprazole  40 mg Oral Daily  . Warfarin - Pharmacist Dosing Inpatient   Does not apply q1800     LOS: 2 days   Cherene Altes, MD [facility] Office  [phone] Pager - Text Page per Amion as per below:  On-Call/Text Page:      [email or web address]      password [email or web address]  If 7PM-7AM, please contact night-coverage [email or web address] Password [facility] 05/23/2017, 2:04 PM

## 2017-05-24 ENCOUNTER — Inpatient Hospital Stay (HOSPITAL_COMMUNITY): Payer: Medicare Other

## 2017-05-24 ENCOUNTER — Encounter (HOSPITAL_COMMUNITY): Payer: Self-pay | Admitting: General Surgery

## 2017-05-24 DIAGNOSIS — I480 Paroxysmal atrial fibrillation: Secondary | ICD-10-CM

## 2017-05-24 DIAGNOSIS — N186 End stage renal disease: Secondary | ICD-10-CM

## 2017-05-24 DIAGNOSIS — J15212 Pneumonia due to Methicillin resistant Staphylococcus aureus: Secondary | ICD-10-CM

## 2017-05-24 DIAGNOSIS — J9601 Acute respiratory failure with hypoxia: Secondary | ICD-10-CM

## 2017-05-24 DIAGNOSIS — R7881 Bacteremia: Secondary | ICD-10-CM

## 2017-05-24 DIAGNOSIS — Z22322 Carrier or suspected carrier of Methicillin resistant Staphylococcus aureus: Secondary | ICD-10-CM

## 2017-05-24 DIAGNOSIS — Z992 Dependence on renal dialysis: Secondary | ICD-10-CM

## 2017-05-24 DIAGNOSIS — E118 Type 2 diabetes mellitus with unspecified complications: Secondary | ICD-10-CM

## 2017-05-24 HISTORY — PX: IR REMOVAL TUN CV CATH W/O FL: IMG2289

## 2017-05-24 LAB — RENAL FUNCTION PANEL
Albumin: 2.2 g/dL — ABNORMAL LOW (ref 3.5–5.0)
Anion gap: 16 — ABNORMAL HIGH (ref 5–15)
BUN: 53 mg/dL — ABNORMAL HIGH (ref 6–20)
CO2: 23 mmol/L (ref 22–32)
Calcium: 7.5 mg/dL — ABNORMAL LOW (ref 8.9–10.3)
Chloride: 101 mmol/L (ref 101–111)
Creatinine, Ser: 7.16 mg/dL — ABNORMAL HIGH (ref 0.44–1.00)
GFR calc Af Amer: 6 mL/min — ABNORMAL LOW (ref >60)
GFR calc non Af Amer: 5 mL/min — ABNORMAL LOW (ref >60)
Glucose, Bld: 95 mg/dL (ref 65–99)
Phosphorus: 3 mg/dL (ref 2.5–4.6)
Potassium: 4.2 mmol/L (ref 3.5–5.1)
Sodium: 140 mmol/L (ref 135–145)

## 2017-05-24 LAB — CBC
HCT: 36.5 % (ref 36.0–46.0)
Hemoglobin: 11.8 g/dL — ABNORMAL LOW (ref 12.0–15.0)
MCH: 31.4 pg (ref 26.0–34.0)
MCHC: 32.3 g/dL (ref 30.0–36.0)
MCV: 97.1 fL (ref 78.0–100.0)
Platelets: 248 10*3/uL (ref 150–400)
RBC: 3.76 MIL/uL — ABNORMAL LOW (ref 3.87–5.11)
RDW: 16.9 % — ABNORMAL HIGH (ref 11.5–15.5)
WBC: 19.3 10*3/uL — ABNORMAL HIGH (ref 4.0–10.5)

## 2017-05-24 LAB — CULTURE, BLOOD (ROUTINE X 2): Special Requests: ADEQUATE

## 2017-05-24 LAB — CULTURE, RESPIRATORY

## 2017-05-24 LAB — GLUCOSE, CAPILLARY
Glucose-Capillary: 87 mg/dL (ref 65–99)
Glucose-Capillary: 166 mg/dL — ABNORMAL HIGH (ref 65–99)
Glucose-Capillary: 134 mg/dL — ABNORMAL HIGH (ref 65–99)

## 2017-05-24 LAB — CULTURE, RESPIRATORY W GRAM STAIN

## 2017-05-24 MED ORDER — CHLORHEXIDINE GLUCONATE 4 % EX LIQD
CUTANEOUS | Status: AC | PRN
  Administered 2017-05-24: 1 via TOPICAL

## 2017-05-24 MED ORDER — SODIUM CHLORIDE 0.9 % IV SOLN: 100.0000 mL | INTRAVENOUS | Status: DC | PRN

## 2017-05-24 MED ORDER — ALTEPLASE 2 MG IJ SOLR: 2.0000 mg | Freq: Once | INTRAMUSCULAR | Status: DC | PRN

## 2017-05-24 MED ORDER — HEPARIN SODIUM (PORCINE) 1000 UNIT/ML DIALYSIS: 1000.0000 [IU] | INTRAMUSCULAR | Status: DC | PRN

## 2017-05-24 MED ORDER — PENTAFLUOROPROP-TETRAFLUOROETH EX AERO: 1.0000 "application " | INHALATION_SPRAY | CUTANEOUS | Status: DC | PRN

## 2017-05-24 MED ORDER — LIDOCAINE HCL (PF) 1 % IJ SOLN: 5.0000 mL | INTRAMUSCULAR | Status: DC | PRN

## 2017-05-24 MED ORDER — MIDODRINE HCL 5 MG PO TABS
ORAL_TABLET | ORAL | Status: AC
  Administered 2017-05-24: 10 mg via ORAL
  Filled 2017-05-24: qty 2

## 2017-05-24 MED ORDER — LIDOCAINE-EPINEPHRINE (PF) 1 %-1:200000 IJ SOLN
INTRAMUSCULAR | Status: AC
  Filled 2017-05-24: qty 30

## 2017-05-24 MED ORDER — LIDOCAINE-PRILOCAINE 2.5-2.5 % EX CREA: 1.0000 "application " | TOPICAL_CREAM | CUTANEOUS | Status: DC | PRN

## 2017-05-24 MED ORDER — DOXERCALCIFEROL 4 MCG/2ML IV SOLN
INTRAVENOUS | Status: AC
  Filled 2017-05-24: qty 2

## 2017-05-24 MED ORDER — LIDOCAINE-EPINEPHRINE (PF) 2 %-1:200000 IJ SOLN
INTRAMUSCULAR | Status: AC | PRN
  Administered 2017-05-24: 10 mL

## 2017-05-24 MED ORDER — CHLORHEXIDINE GLUCONATE 4 % EX LIQD
CUTANEOUS | Status: AC
  Filled 2017-05-24: qty 15

## 2017-05-24 MED ORDER — VANCOMYCIN HCL IN DEXTROSE 1-5 GM/200ML-% IV SOLN
INTRAVENOUS | Status: AC
  Administered 2017-05-24: 1 g via INTRAVENOUS
  Filled 2017-05-24: qty 200

## 2017-05-24 NOTE — Progress Notes (Signed)
ANTICOAGULATION CONSULT NOTE  Pharmacy Consult for Warfarin Indication: chest pain/ACS and atrial fibrillation  Allergies  Allergen Reactions  . Amoxicillin Rash    Has patient had a PCN reaction causing immediate rash, facial/tongue/throat swelling, SOB or lightheadedness with hypotension:YES Has patient had a PCN reaction causing severe rash involving mucus membranes or skin necrosis: Yes Has patient had a PCN reaction that required hospitalization No Has patient had a PCN reaction occurring within the last 10 years: Yes If all of the above answers are "NO", then may proceed with Cephalosporin use.   . Sulfa Drugs Cross Reactors Hives and Itching  . Percocet [Oxycodone-Acetaminophen] Itching and Rash    Did not happen last time she took it  . Sulfa Antibiotics Hives    Patient Measurements: Height: 5\' 5"  (165.1 cm) Weight: 195 lb 12.3 oz (88.8 kg) IBW/kg (Calculated) : 57  Vital Signs: Temp: 97.9 F (36.6 C) (02/20 1658) Temp Source: Oral (02/20 1658) BP: 107/72 (02/20 1658) Pulse Rate: 120 (02/20 1658)  Labs: Recent Labs    05/21/17 2250 05/22/17 0303 05/22/17 1738 05/23/17 0349 05/24/17 0830  HGB  --  12.7  --   --  11.8*  HCT  --  39.2  --   --  36.5  PLT  --  198  --   --  248  LABPROT  --  48.2*  --  41.6*  --   INR  --  5.30*  --  4.39*  --   CREATININE  --  8.15*  --   --  7.16*  TROPONINI 0.03* 0.03* 0.26*  --   --     Estimated Creatinine Clearance: 7.2 mL/min (A) (by C-G formula based on SCr of 7.16 mg/dL (H)).   Assessment: 77 year old female on warfarin prior to admission- home dose 5mg  daily except 7.5mg  on Wednesdays, last dose taken 2/17.  INR elevated at 4.39 yesterday on admission, no INR was checked today but is likely still elevated.  Goal of Therapy:  INR 2-3 Monitor platelets by anticoagulation protocol: Yes   Plan:  Hold warfarin for now Daily INR  Jefferson Fullam D. Kamarion Zagami, PharmD, BCPS Clinical Pharmacist [phone] 05/24/2017 7:53  PM

## 2017-05-24 NOTE — Procedures (Signed)
Successful removal of tunneled HD cath. Hemostasis achieved and dry clean dressing placed.  [name] 3:21 PM 05/24/2017

## 2017-05-24 NOTE — Progress Notes (Signed)
PROGRESS NOTE    [name]  UVO:[id number] DOB: 06/21/1940 DOA: 05/21/2017 PCP: Dione Housekeeper, MD   Brief Narrative:  [name] PMHx ESRD on HD M/W/F Paroxysmal  A-Fib , HTN, Hypothyroidism, and DM Type 2    Brought to the ER w/ persistent shortness of breath and right-sided chest pain.  She was started on antibiotics and prednisone on February 11 by her PCP, and also treated w/ Tamiflu     Subjective: 2/20 A/O 4, positive pleuritic chest pain, positive SOB, negative abdominal pain, negative N/V. Positive productive cough (yellow) not on home O2.   Assessment & Plan:   Principal Problem:   Acute respiratory failure with hypoxia (HCC) Active Problems:   Hypothyroidism   Anemia   Atrial fibrillation (HCC)  Acute Respiratory Failure with Hypoxia/positive MRSA pneumonia -Continue current antibiotics -Budesonide nebulizer BID -Ipratropium nebulizer TID -Xopenex nebulizer PRN -Flutter valve -Aggressive pulmonary toilet   MRSA bacteremia - continue current course of antibiotics -2/20 S/P HD cath removed -TEE pending. On 2/21 contact cardiology to determine exact time a TEE  Sinus Tachycardia -Most likely secondary to bacteremia/pneumonia.  -Asymptomatic -TSH normal   Chronic paroxysmal atrial fibrillation -Currently in NSR -Coumadin per pharmacy Recent Labs  Lab 05/21/17 1750 05/22/17 0303 05/23/17 0349  INR 4.43* 5.30* 4.39*  -2/20 supratherapeutic. Hold Coumadin   Chronic Hypotension  -Midodrin 10 mg with HD    ESRD on  HD M/W/F  -HD per nephrology   Hypothyroidism -Synthroid 100 g daily  Diabetes type 2   controlled with complication -10/05/2593 Hemoglobin A1c= 5.4 -Hemoglobin A1c pending -Lipid panel pending    DVT prophylaxis: Coumadin Code Status: DO NOT RESUSCITATE Family Communication: Family at bedside for discussion of plan of care Disposition Plan: TBD   Consultants:  Nephrology ID   Procedures/Significant Events:     I  have personally reviewed and interpreted all radiology studies and my findings are as above.  VENTILATOR SETTINGS:    Cultures 2/17 Blood Positive MRSA  2/17 Sputum Positive MRSA    Antimicrobials: Anti-infectives (From admission, onward)   Start     Stop   05/24/17 1200  vancomycin (VANCOCIN) IVPB 1000 mg/200 mL premix         05/22/17 1800  vancomycin (VANCOCIN) IVPB 1000 mg/200 mL premix     05/23/17 1315   05/22/17 1604  vancomycin (VANCOCIN) 1-5 GM/200ML-% IVPB    Comments:  Ashley Akin   : cabinet override   05/23/17 0414   05/22/17 0800  aztreonam (AZACTAM) 500 mg in dextrose 5 % 50 mL IVPB  Status:  Discontinued     05/22/17 1710   05/21/17 2100  vancomycin (VANCOCIN) IVPB 1000 mg/200 mL premix     05/21/17 2243   05/21/17 1845  aztreonam (AZACTAM) 2 g in sodium chloride 0.9 % 100 mL IVPB     05/21/17 2032   05/21/17 1845  vancomycin (VANCOCIN) IVPB 1000 mg/200 mL premix     05/21/17 2122       Devices    LINES / TUBES:  Tunneled HD cath??>> 2/20    Continuous Infusions: . vancomycin    . sodium chloride    . sodium chloride    . vancomycin       Objective: Vitals:   05/23/17 2156 05/24/17 0029 05/24/17 0436 05/24/17 0536  BP:  102/66 103/82   Pulse:  (!) 122 (!) 109   Resp:  (!) 22 (!) 22   Temp:  98 F (36.7 C)  99.1 F (37.3 C)   TempSrc:  Oral Oral   SpO2: 96% 96% 94% 95%  Weight:   197 lb 9.6 oz (89.6 kg)   Height:        Intake/Output Summary (Last 24 hours) at 05/24/2017 0911 Last data filed at 05/23/2017 1737 Gross per 24 hour  Intake 840 ml  Output 200 ml  Net 640 ml   Filed Weights   05/21/17 1657 05/23/17 0500 05/24/17 0436  Weight: 200 lb (90.7 kg) 191 lb 12.8 oz (87 kg) 197 lb 9.6 oz (89.6 kg)    Examination:  General: A/O 4, positive acute respiratory distress Neck:  Negative scars, masses, torticollis, lymphadenopathy, JVD Lungs: positive diffuse rhonchi, decreased to absent breath sounds RLL, negative crackles,  negative expiratory wheeze.  Cardiovascular: Regular rate and rhythm without murmur gallop or rub normal S1 and S2 Abdomen: negative abdominal pain, nondistended, positive soft, bowel sounds, no rebound, no ascites, no appreciable mass Extremities: No significant cyanosis, clubbing, or edema bilateral lower extremities Skin: Negative rashes, lesions, ulcers Psychiatric:  Negative depression, negative anxiety, negative fatigue, negative mania  Central nervous system:  Cranial nerves II through XII intact, tongue/uvula midline, all extremities muscle strength 5/5, sensation intact throughout,  negative dysarthria, negative expressive aphasia, negative receptive aphasia.  .     Data Reviewed: Care during the described time interval was provided by me .  I have reviewed this patient's available data, including medical history, events of note, physical examination, and all test results as part of my evaluation.   CBC: Recent Labs  Lab 05/21/17 1750 05/22/17 0303  WBC 19.6* 17.0*  NEUTROABS 17.3*  --   HGB 13.6 12.7  HCT 42.6 39.2  MCV 98.6 97.3  PLT 216 637   Basic Metabolic Panel: Recent Labs  Lab 05/21/17 1750 05/22/17 0303  NA 138 135  K 4.0 3.8  CL 98* 97*  CO2 22 20*  GLUCOSE 128* 163*  BUN 54* 62*  CREATININE 7.80* 8.15*  CALCIUM 6.7* 6.3*   GFR: Estimated Creatinine Clearance: 6.4 mL/min (A) (by C-G formula based on SCr of 8.15 mg/dL (H)). Liver Function Tests: Recent Labs  Lab 05/21/17 1750  AST 24  ALT 20  ALKPHOS 103  BILITOT 1.0  PROT 6.0*  ALBUMIN 3.1*   No results for input(s): LIPASE, AMYLASE in the last 168 hours. No results for input(s): AMMONIA in the last 168 hours. Coagulation Profile: Recent Labs  Lab 05/21/17 1750 05/22/17 0303 05/23/17 0349  INR 4.43* 5.30* 4.39*   Cardiac Enzymes: Recent Labs  Lab 05/21/17 2250 05/22/17 0303 05/22/17 1738  TROPONINI 0.03* 0.03* 0.26*   BNP (last 3 results) No results for input(s): PROBNP in the  last 8760 hours. HbA1C: No results for input(s): HGBA1C in the last 72 hours. CBG: Recent Labs  Lab 05/22/17 2155 05/23/17 0737 05/23/17 1132 05/23/17 1608 05/23/17 2116  GLUCAP 166* 98 144* 137* 115*   Lipid Profile: No results for input(s): CHOL, HDL, LDLCALC, TRIG, CHOLHDL, LDLDIRECT in the last 72 hours. Thyroid Function Tests: Recent Labs    05/21/17 2250  TSH 0.773   Anemia Panel: No results for input(s): VITAMINB12, FOLATE, FERRITIN, TIBC, IRON, RETICCTPCT in the last 72 hours. Urine analysis:    Component Value Date/Time   COLORURINE YELLOW 07/26/2013 1202   APPEARANCEUR CLEAR 07/26/2013 1202   LABSPEC 1.016 07/26/2013 1202   PHURINE 7.0 07/26/2013 1202   GLUCOSEU NEGATIVE 07/26/2013 1202   HGBUR SMALL (A) 07/26/2013 1202   BILIRUBINUR NEGATIVE  07/26/2013 1202   KETONESUR NEGATIVE 07/26/2013 1202   PROTEINUR 100 (A) 07/26/2013 1202   UROBILINOGEN 1.0 07/26/2013 1202   NITRITE NEGATIVE 07/26/2013 1202   LEUKOCYTESUR SMALL (A) 07/26/2013 1202   Sepsis Labs: @LABRCNTIP (procalcitonin:4,lacticidven:4)  ) Recent Results (from the past 240 hour(s))  Blood Culture (routine x 2)     Status: Abnormal (Preliminary result)   Collection Time: 05/21/17  6:41 PM  Result Value Ref Range Status   Specimen Description BLOOD RIGHT FOREARM  Final   Special Requests   Final    BOTTLES DRAWN AEROBIC AND ANAEROBIC Blood Culture adequate volume   Culture  Setup Time   Final    GRAM POSITIVE COCCI ANAEROBIC BOTTLE ONLY CRITICAL RESULT CALLED TO, READ BACK BY AND VERIFIED WITH: E SINCLAIR PHARMD 1508 05/22/17 A BROWNING    Culture (A)  Final    STAPHYLOCOCCUS AUREUS SUSCEPTIBILITIES TO FOLLOW Performed at [facility], [place] [address], [place], [place] [address]    Report Status PENDING  Incomplete  Blood Culture ID Panel (Reflexed)     Status: Abnormal   Collection Time: 05/21/17  6:41 PM  Result Value Ref Range Status   Enterococcus species NOT DETECTED NOT  DETECTED Final   Listeria monocytogenes NOT DETECTED NOT DETECTED Final   Staphylococcus species DETECTED (A) NOT DETECTED Final    Comment: CRITICAL RESULT CALLED TO, READ BACK BY AND VERIFIED WITH: E SINCLAIR PHARMD 1508 05/22/17 A BROWNING    Staphylococcus aureus DETECTED (A) NOT DETECTED Final    Comment: Methicillin (oxacillin)-resistant Staphylococcus aureus (MRSA). MRSA is predictably resistant to beta-lactam antibiotics (except ceftaroline). Preferred therapy is vancomycin unless clinically contraindicated. Patient requires contact precautions if  hospitalized. CRITICAL RESULT CALLED TO, READ BACK BY AND VERIFIED WITH: E SINCLAIR PHARMD 1508 05/22/17 A BROWNING    Methicillin resistance DETECTED (A) NOT DETECTED Final    Comment: CRITICAL RESULT CALLED TO, READ BACK BY AND VERIFIED WITH: E SINCLAIR PHARMD 1508 05/22/17 A BROWNING    Streptococcus species NOT DETECTED NOT DETECTED Final   Streptococcus agalactiae NOT DETECTED NOT DETECTED Final   Streptococcus pneumoniae NOT DETECTED NOT DETECTED Final   Streptococcus pyogenes NOT DETECTED NOT DETECTED Final   Acinetobacter baumannii NOT DETECTED NOT DETECTED Final   Enterobacteriaceae species NOT DETECTED NOT DETECTED Final   Enterobacter cloacae complex NOT DETECTED NOT DETECTED Final   Escherichia coli NOT DETECTED NOT DETECTED Final   Klebsiella oxytoca NOT DETECTED NOT DETECTED Final   Klebsiella pneumoniae NOT DETECTED NOT DETECTED Final   Proteus species NOT DETECTED NOT DETECTED Final   Serratia marcescens NOT DETECTED NOT DETECTED Final   Haemophilus influenzae NOT DETECTED NOT DETECTED Final   Neisseria meningitidis NOT DETECTED NOT DETECTED Final   Pseudomonas aeruginosa NOT DETECTED NOT DETECTED Final   Candida albicans NOT DETECTED NOT DETECTED Final   Candida glabrata NOT DETECTED NOT DETECTED Final   Candida krusei NOT DETECTED NOT DETECTED Final   Candida parapsilosis NOT DETECTED NOT DETECTED Final   Candida  tropicalis NOT DETECTED NOT DETECTED Final    Comment: Performed at [facility], [place]. [address], [place], [place] [address]  Blood Culture (routine x 2)     Status: None (Preliminary result)   Collection Time: 05/21/17  6:46 PM  Result Value Ref Range Status   Specimen Description BLOOD LEFT ANTECUBITAL  Final   Special Requests   Final    BOTTLES DRAWN AEROBIC AND ANAEROBIC Blood Culture adequate volume   Culture  Final    NO GROWTH 2 DAYS Performed at [facility], [place] [address], [place], [place] [address]    Report Status PENDING  Incomplete  Culture, expectorated sputum-assessment     Status: None   Collection Time: 05/21/17  7:03 PM  Result Value Ref Range Status   Specimen Description SPUTUM  Final   Special Requests NONE  Final   Sputum evaluation   Final    THIS SPECIMEN IS ACCEPTABLE FOR SPUTUM CULTURE Performed at [facility], [address] [place], [place] [address]    Report Status 05/22/2017 FINAL  Final  Culture, respiratory (NON-Expectorated)     Status: None   Collection Time: 05/21/17  7:03 PM  Result Value Ref Range Status   Specimen Description SPUTUM  Final   Special Requests NONE Reflexed from [phone]  Final   Gram Stain   Final    FEW WBC PRESENT, PREDOMINANTLY PMN ABUNDANT GRAM POSITIVE COCCI IN CLUSTERS MODERATE GRAM NEGATIVE RODS FEW YEAST Performed at [facility], [address], [place], [place] [address]    Culture   Final    ABUNDANT METHICILLIN RESISTANT STAPHYLOCOCCUS AUREUS   Report Status 05/24/2017 FINAL  Final   Organism ID, Bacteria METHICILLIN RESISTANT STAPHYLOCOCCUS AUREUS  Final      Susceptibility   Methicillin resistant staphylococcus aureus - MIC*    CIPROFLOXACIN >=8 RESISTANT Resistant     ERYTHROMYCIN >=8 RESISTANT Resistant     GENTAMICIN <=0.5 SENSITIVE Sensitive     OXACILLIN >=4 RESISTANT Resistant     TETRACYCLINE <=1 SENSITIVE Sensitive     VANCOMYCIN <=0.5 SENSITIVE Sensitive      TRIMETH/SULFA <=10 SENSITIVE Sensitive     CLINDAMYCIN <=0.25 SENSITIVE Sensitive     RIFAMPIN <=0.5 SENSITIVE Sensitive     Inducible Clindamycin NEGATIVE Sensitive     * ABUNDANT METHICILLIN RESISTANT STAPHYLOCOCCUS AUREUS         Radiology Studies: No results found.      Scheduled Meds: . budesonide (PULMICORT) nebulizer solution  0.25 mg Nebulization BID  . cinacalcet  30 mg Oral QODAY  . citalopram  10 mg Oral BID  . doxercalciferol  1 mcg Intravenous Q M,W,F-HD  . ezetimibe  10 mg Oral Daily  . feeding supplement (NEPRO CARB STEADY)  237 mL Oral BID BM  . ferric citrate  420 mg Oral TID AC  . gabapentin  100 mg Oral QHS  . insulin aspart  0-9 Units Subcutaneous TID WC  . ipratropium  0.5 mg Nebulization Q8H  . levalbuterol  0.63 mg Nebulization Q8H  . levothyroxine  100 mcg Oral QAC breakfast  . midodrine  10 mg Oral Q M,W,F-HD  . multivitamin  1 tablet Oral Daily  . pantoprazole  40 mg Oral Daily  . Warfarin - Pharmacist Dosing Inpatient   Does not apply q1800   Continuous Infusions: . vancomycin    . sodium chloride    . sodium chloride    . vancomycin       LOS: 3 days    Time spent: 40 minutes    [name], Michelle Docker, MD [facility] Pager [phone]   If 7PM-7AM, please contact night-coverage [email or web address] Password [facility] 05/24/2017, 9:11 AM

## 2017-05-24 NOTE — Progress Notes (Addendum)
[facility] Progress Note   Subjective: Seen on HD, tolerating well. Wasn't given midodrine prior to start of treatment but has rec'd dose now. Says she still feels terrible but not as bad as she did when she first arrived to hospital.  HR still in 120s. ST on monitor.   Objective Vitals:   05/24/17 0800 05/24/17 0830 05/24/17 0900 05/24/17 0930  BP: (!) 85/62 93/74 94/74  106/63  Pulse: (!) 126 (!) 124 (!) 121 (!) 123  Resp:      Temp:      TempSrc:      SpO2:      Weight:      Height:       Physical Exam General: Chronically ill appearing female in NAD Heart: S1,S2 Tachy Lungs: Bilateral breath sounds decreased in bases with scattered coarse breath sounds upper lung fields, no wheezing, no pleural rub. Shallow respirations, no WOB Abdomen: Active BS, NT, ND Extremities: No LE edema Dialysis Access: R[facility] Drs[facility] CDI.    Additional Objective Labs: Basic Metabolic Panel: Recent Labs  Lab 05/21/17 1750 05/22/17 0303 05/24/17 0830  NA 138 135 140  K 4.0 3.8 4.2  CL 98* 97* 101  CO2 22 20* 23  GLUCOSE 128* 163* 95  BUN 54* 62* 53*  CREATININE 7.80* 8.15* 7.16*  CALCIUM 6.7* 6.3* 7.5*  PHOS  --   --  3.0   Liver Function Tests: Recent Labs  Lab 05/21/17 1750 05/24/17 0830  AST 24  --   ALT 20  --   ALKPHOS 103  --   BILITOT 1.0  --   PROT 6.0*  --   ALBUMIN 3.1* 2.2*   No results for input(s): LIPASE, AMYLASE in the last 168 hours. CBC: Recent Labs  Lab 05/21/17 1750 05/22/17 0303 05/24/17 0830  WBC 19.6* 17.0* 19.3*  NEUTROABS 17.3*  --   --   HGB 13.6 12.7 11.8*  HCT 42.6 39.2 36.5  MCV 98.6 97.3 97.1  PLT 216 198 248   Blood Culture    Component Value Date/Time   SDES SPUTUM 05/21/2017 1903   SDES SPUTUM 05/21/2017 1903   SPECREQUEST NONE 05/21/2017 1903   SPECREQUEST NONE Reflexed from [id number] 05/21/2017 1903   CULT  05/21/2017 1903    ABUNDANT METHICILLIN RESISTANT STAPHYLOCOCCUS AUREUS   REPTSTATUS 05/22/2017 FINAL  05/21/2017 1903   REPTSTATUS 05/24/2017 FINAL 05/21/2017 1903    Cardiac Enzymes: Recent Labs  Lab 05/21/17 2250 05/22/17 0303 05/22/17 1738  TROPONINI 0.03* 0.03* 0.26*   CBG: Recent Labs  Lab 05/22/17 2155 05/23/17 0737 05/23/17 1132 05/23/17 1608 05/23/17 2116  GLUCAP 166* 98 144* 137* 115*   Iron Studies: No results for input(s): IRON, TIBC, TRANSFERRIN, FERRITIN in the last 72 hours. @lablastinr3 @ Studies/Results: No results found. Medications: . sodium chloride    . sodium chloride    . vancomycin    . vancomycin     . budesonide (PULMICORT) nebulizer solution  0.25 mg Nebulization BID  . cinacalcet  30 mg Oral QODAY  . citalopram  10 mg Oral BID  . doxercalciferol  1 mcg Intravenous Q M,W,F-HD  . ezetimibe  10 mg Oral Daily  . feeding supplement (NEPRO CARB STEADY)  237 mL Oral BID BM  . ferric citrate  420 mg Oral TID AC  . gabapentin  100 mg Oral QHS  . insulin aspart  0-9 Units Subcutaneous TID WC  . ipratropium  0.5 mg Nebulization Q8H  . levalbuterol  0.63 mg Nebulization  Q8H  . levothyroxine  100 mcg Oral QAC breakfast  . midodrine  10 mg Oral Q M,W,F-HD  . multivitamin  1 tablet Oral Daily  . pantoprazole  40 mg Oral Daily  . Warfarin - Pharmacist Dosing Inpatient   Does not apply q1800     Dialysis Orders:[facility] MWF 4 hrs 160 NRe 350/800 manual 89 Kg 2.0 K/2.5 Ca UFP 4 linear sodium -No heparin -Mircera 100 mcg IV q 2 weeks (last dose 05/08/17 HGB 12.7 05/17/17) -Venofer 50 mg IV weekly (last dose 05/15/17) -Hectorol 1 mcg IV TIW  Assessment/Plan: 1. Acute respiratory failure: basilar/ RML PNA w/ +MRSA in blood and sputum. Also flu A + pneumonitis. Per primary/ID.   2.  MRSA Bacteremia: Seen by ID. On Vanc. Recommending removing TDC and line holiday. Will remove TDC after HD, and replace with new TDC on Friday.   3. ESRD - MWF-HD in progress. No heparin. K+ 4.2 change to 2.0 K bath. 4. Hypertension/volume -BP on soft side but  improving. Decreased midodrine to 10 mg PO TIW with HD D/T elevated BP and HR yesterday. UFG 1.0 liters.  5. Anemia - HGB 11.8 No ESA needed. 6. Metabolic bone disease - Ca 7.5 C Ca 9.0 Phos 3.0 Continue binders, VDRA, sensipar. 7. Nutrition - Albumin 2.2 in the setting of sepsis. Renal/Carb. Renal vit nepro. Add prostat.  8. H/O Afib: Coumadin on hold. INR 05/23/17 4.39. Per pharmacy.  9. DM: per primary    Rita H. Brown NP-C 05/24/2017, 10:18 AM  [facility] [phone]  Pt seen, examined and agree w A/P as above. HD today w/ TDC removal post HD.   Kelly Splinter MD [employer] pager [phone]   05/24/2017, 3:02 PM

## 2017-05-24 NOTE — Progress Notes (Signed)
[name] for Infectious Disease    Date of Admission:  05/21/2017     Total days of antibiotics 3   Day 3 Vancomycin           ID: [name] is a 77 y.o. female with ESRD found to have flu,with secondary MRSA bacteremia/pna with likely HD line involvement Principal Problem:   Acute respiratory failure with hypoxia ([place]) Active Problems:   Hypothyroidism   Anemia   Atrial fibrillation ([place])   Subjective: Feeling a little better. Still wearing supplemental oxygen and with hacking cough and right sided chest pain/discomfort from this. Fevers improving hopefully - temp last PM 100.3 and 99 this morning. WBC 17 > 19.3 today. Getting HD now.   Medications:  . budesonide (PULMICORT) nebulizer solution  0.25 mg Nebulization BID  . cinacalcet  30 mg Oral QODAY  . citalopram  10 mg Oral BID  . doxercalciferol      . doxercalciferol  1 mcg Intravenous Q M,W,Roman-HD  . ezetimibe  10 mg Oral Daily  . feeding supplement (NEPRO CARB STEADY)  237 mL Oral BID BM  . ferric citrate  420 mg Oral TID AC  . gabapentin  100 mg Oral QHS  . insulin aspart  0-9 Units Subcutaneous TID WC  . ipratropium  0.5 mg Nebulization Q8H  . levalbuterol  0.63 mg Nebulization Q8H  . levothyroxine  100 mcg Oral QAC breakfast  . midodrine  10 mg Oral Q M,W,Roman-HD  . multivitamin  1 tablet Oral Daily  . pantoprazole  40 mg Oral Daily  . Warfarin - Pharmacist Dosing Inpatient   Does not apply q1800    Objective: Vital signs in last 24 hours: Temp:  [98 Roman (36.7 C)-100.3 Roman (37.9 C)] 99 Roman (37.2 C) (02/20 0730) Pulse Rate:  [69-126] 122 (02/20 1100) Resp:  [18-22] 18 (02/20 1054) BP: (85-131)/(62-91) 115/76 (02/20 1100) SpO2:  [94 %-98 %] 98 % (02/20 1054) Weight:  [197 lb 1.5 oz (89.4 kg)-197 lb 9.6 oz (89.6 kg)] 197 lb 1.5 oz (89.4 kg) (02/20 0730) Physical Exam  Constitutional:  oriented to person, place, and time. Elderly but well-developed and well-nourished. No distress.  HENT: [place]/AT, PERRLA,  no scleral icterus Mouth/Throat: Oropharynx is clear and moist. No oropharyngeal exudate.  Cardiovascular: Tachycardic, regular rhythm and normal heart sounds. Exam reveals no gallop and no friction rub. No murmur heard.  Pulmonary/Chest: Effort normal but diffuse rhonchi still present Chest wall -right HD catheter has no surrounding erythema and dressing clean and dry Neck = supple, no nuchal rigidity Abdominal: Soft. Bowel sounds are normal.  exhibits no distension. There is no tenderness.  Lymphadenopathy: no cervical adenopathy. No axillary adenopathy Neurological: alert and oriented to person, place, and time.  Skin: Skin is warm and dry. No rash noted. No erythema.  Psychiatric: a normal mood and affect.  behavior is normal.    Lab Results Recent Labs    05/22/17 0303 05/24/17 0830  WBC 17.0* 19.3*  HGB 12.7 11.8*  HCT 39.2 36.5  NA 135 140  K 3.8 4.2  CL 97* 101  CO2 20* 23  BUN 62* 53*  CREATININE 8.15* 7.16*   Liver Panel Recent Labs    05/21/17 1750 05/24/17 0830  PROT 6.0*  --   ALBUMIN 3.1* 2.2*  AST 24  --   ALT 20  --   ALKPHOS 103  --   BILITOT 1.0  --     Microbiology: Blood cx 05/21/17 >>  MRSA in 1 of 4 bottles Sputum cx  05/21/17 >> MRSA   Studies/Results: No results found.   Assessment/Plan: [name] with ESRD on HD via right IJ tunneled cathter having influenza complication with secondary MRSA pneumonia and bacteremia 1/4 bottles.   MRSA Bacteremia = Getting HD session and then IR pull catheter - appreciate nephrology coordinating this. TTE without mention of vegetation. Continue with vancomycin post HD. Line has been removed and planning to replace on Friday for 48h holiday. Will repeat blood cultures.   MRSA Pneumonia = down to 2 L of oxygen and feeling a little better. Continue Vancomycin.   Janene Madeira, MSN, NP-C [facility]ious [place] Cell: [phone] Pager: [phone]  05/24/2017,  11:30 AM

## 2017-05-25 ENCOUNTER — Encounter (HOSPITAL_COMMUNITY): Payer: Self-pay | Admitting: General Surgery

## 2017-05-25 DIAGNOSIS — A419 Sepsis, unspecified organism: Secondary | ICD-10-CM

## 2017-05-25 DIAGNOSIS — I482 Chronic atrial fibrillation: Secondary | ICD-10-CM

## 2017-05-25 LAB — CBC
HCT: 35.3 % — ABNORMAL LOW (ref 36.0–46.0)
Hemoglobin: 11.7 g/dL — ABNORMAL LOW (ref 12.0–15.0)
MCH: 31.8 pg (ref 26.0–34.0)
MCHC: 33.1 g/dL (ref 30.0–36.0)
MCV: 95.9 fL (ref 78.0–100.0)
Platelets: 237 10*3/uL (ref 150–400)
RBC: 3.68 MIL/uL — ABNORMAL LOW (ref 3.87–5.11)
RDW: 16.6 % — ABNORMAL HIGH (ref 11.5–15.5)
WBC: 19.7 10*3/uL — ABNORMAL HIGH (ref 4.0–10.5)

## 2017-05-25 LAB — LIPID PANEL
Cholesterol: 107 mg/dL (ref 0–200)
HDL: 32 mg/dL — ABNORMAL LOW (ref >40)
LDL Cholesterol: 57 mg/dL (ref 0–99)
Total CHOL/HDL Ratio: 3.3 RATIO
Triglycerides: 88 mg/dL (ref <150)
VLDL: 18 mg/dL (ref 0–40)

## 2017-05-25 LAB — BASIC METABOLIC PANEL
Anion gap: 12 (ref 5–15)
BUN: 29 mg/dL — ABNORMAL HIGH (ref 6–20)
CO2: 28 mmol/L (ref 22–32)
Calcium: 7.7 mg/dL — ABNORMAL LOW (ref 8.9–10.3)
Chloride: 99 mmol/L — ABNORMAL LOW (ref 101–111)
Creatinine, Ser: 4.64 mg/dL — ABNORMAL HIGH (ref 0.44–1.00)
GFR calc Af Amer: 10 mL/min — ABNORMAL LOW (ref >60)
GFR calc non Af Amer: 8 mL/min — ABNORMAL LOW (ref >60)
Glucose, Bld: 117 mg/dL — ABNORMAL HIGH (ref 65–99)
Potassium: 4.1 mmol/L (ref 3.5–5.1)
Sodium: 139 mmol/L (ref 135–145)

## 2017-05-25 LAB — GLUCOSE, CAPILLARY
Glucose-Capillary: 101 mg/dL — ABNORMAL HIGH (ref 65–99)
Glucose-Capillary: 147 mg/dL — ABNORMAL HIGH (ref 65–99)
Glucose-Capillary: 147 mg/dL — ABNORMAL HIGH (ref 65–99)
Glucose-Capillary: 140 mg/dL — ABNORMAL HIGH (ref 65–99)

## 2017-05-25 LAB — MRSA PCR SCREENING: MRSA by PCR: POSITIVE — AB

## 2017-05-25 LAB — HEMOGLOBIN A1C
Hgb A1c MFr Bld: 4.5 % — ABNORMAL LOW (ref 4.8–5.6)
Mean Plasma Glucose: 82.45 mg/dL

## 2017-05-25 LAB — PROTIME-INR
INR: 2.05
Prothrombin Time: 23 seconds — ABNORMAL HIGH (ref 11.4–15.2)

## 2017-05-25 LAB — MAGNESIUM: Magnesium: 1.9 mg/dL (ref 1.7–2.4)

## 2017-05-25 MED ORDER — PENTAFLUOROPROP-TETRAFLUOROETH EX AERO: 1.0000 "application " | INHALATION_SPRAY | CUTANEOUS | Status: DC | PRN

## 2017-05-25 MED ORDER — CHLORHEXIDINE GLUCONATE CLOTH 2 % EX PADS
6.0000 | MEDICATED_PAD | Freq: Every day | CUTANEOUS | Status: DC
  Administered 2017-05-25 – 2017-05-28 (×4): 6 via TOPICAL

## 2017-05-25 MED ORDER — WARFARIN - PHARMACIST DOSING INPATIENT: Freq: Every day | Status: DC

## 2017-05-25 MED ORDER — SODIUM CHLORIDE 0.9 % IV SOLN: 100.0000 mL | INTRAVENOUS | Status: DC | PRN

## 2017-05-25 MED ORDER — WARFARIN SODIUM 7.5 MG PO TABS: 7.5000 mg | ORAL_TABLET | Freq: Once | ORAL | Status: DC

## 2017-05-25 MED ORDER — MUPIROCIN 2 % EX OINT
1.0000 "application " | TOPICAL_OINTMENT | Freq: Two times a day (BID) | CUTANEOUS | Status: DC
  Administered 2017-05-25 – 2017-05-28 (×8): 1 via NASAL
  Filled 2017-05-25: qty 22

## 2017-05-25 MED ORDER — LEVALBUTEROL HCL 0.63 MG/3ML IN NEBU
0.6300 mg | INHALATION_SOLUTION | Freq: Three times a day (TID) | RESPIRATORY_TRACT | Status: DC
  Administered 2017-05-25 – 2017-05-28 (×9): 0.63 mg via RESPIRATORY_TRACT
  Filled 2017-05-25 (×10): qty 3

## 2017-05-25 MED ORDER — CINACALCET HCL 30 MG PO TABS
30.0000 mg | ORAL_TABLET | ORAL | Status: DC
  Filled 2017-05-25: qty 1

## 2017-05-25 MED ORDER — SODIUM CHLORIDE 0.9 % IV SOLN
INTRAVENOUS | Status: DC
  Administered 2017-05-26: 07:00:00 via INTRAVENOUS

## 2017-05-25 MED ORDER — HEPARIN SODIUM (PORCINE) 1000 UNIT/ML DIALYSIS
1000.0000 [IU] | INTRAMUSCULAR | Status: DC | PRN
  Filled 2017-05-25: qty 1

## 2017-05-25 MED ORDER — VITAMIN K1 10 MG/ML IJ SOLN
1.0000 mg | Freq: Once | INTRAVENOUS | Status: AC
  Administered 2017-05-25: 1 mg via INTRAVENOUS
  Filled 2017-05-25: qty 0.1

## 2017-05-25 MED ORDER — ALTEPLASE 2 MG IJ SOLR: 2.0000 mg | Freq: Once | INTRAMUSCULAR | Status: DC | PRN

## 2017-05-25 MED ORDER — LIDOCAINE-PRILOCAINE 2.5-2.5 % EX CREA
1.0000 "application " | TOPICAL_CREAM | CUTANEOUS | Status: DC | PRN
  Filled 2017-05-25: qty 5

## 2017-05-25 MED ORDER — IPRATROPIUM BROMIDE 0.02 % IN SOLN
0.5000 mg | Freq: Three times a day (TID) | RESPIRATORY_TRACT | Status: DC
  Administered 2017-05-25 – 2017-05-28 (×9): 0.5 mg via RESPIRATORY_TRACT
  Filled 2017-05-25 (×10): qty 2.5

## 2017-05-25 NOTE — Progress Notes (Signed)
[name] for Infectious Disease    Date of Admission:  05/21/2017     Total days of antibiotics 4   Day 4 Vancomycin           ID: [name] is a 77 y.o. female with ESRD found to have flu,with secondary MRSA bacteremia/pna with likely HD line involvement Principal Problem:   Acute respiratory failure with hypoxia ([name]) Active Problems:   Hypothyroidism   Anemia   Atrial fibrillation ([place])   Subjective: Feels unchanged. Still with cough although less productive now. Temp 100.6 yesterday that resolved without intervention. WBC still elevated 19.7.   Medications:  . budesonide (PULMICORT) nebulizer solution  0.25 mg Nebulization BID  . Chlorhexidine Gluconate Cloth  6 each Topical Q0600  . [START ON 05/26/2017] cinacalcet  30 mg Oral Q M,W,F  . citalopram  10 mg Oral BID  . doxercalciferol  1 mcg Intravenous Q M,W,F-HD  . ezetimibe  10 mg Oral Daily  . feeding supplement (NEPRO CARB STEADY)  237 mL Oral BID BM  . ferric citrate  420 mg Oral TID AC  . gabapentin  100 mg Oral QHS  . insulin aspart  0-9 Units Subcutaneous TID WC  . ipratropium  0.5 mg Nebulization TID  . levalbuterol  0.63 mg Nebulization TID  . levothyroxine  100 mcg Oral QAC breakfast  . midodrine  10 mg Oral Q M,W,F-HD  . multivitamin  1 tablet Oral Daily  . mupirocin ointment  1 application Nasal BID  . pantoprazole  40 mg Oral Daily  . warfarin  7.5 mg Oral ONCE-1800  . Warfarin - Pharmacist Dosing Inpatient   Does not apply q1800    Objective: Vital signs in last 24 hours: Temp:  [97.9 F (36.6 C)-100.6 F (38.1 C)] 98.8 F (37.1 C) (02/21 0742) Pulse Rate:  [118-123] 120 (02/21 0742) Resp:  [13-27] 15 (02/21 0742) BP: (107-141)/(71-91) 135/91 (02/21 0742) SpO2:  [88 %-98 %] 93 % (02/21 0829) Weight:  [195 lb 12.3 oz (88.8 kg)-195 lb 12.8 oz (88.8 kg)] 195 lb 12.8 oz (88.8 kg) (02/21 0448) Physical Exam  Constitutional:  oriented to person, place, and time. Elderly but well-developed  and well-nourished. No distress.  HENT: [place]/AT, PERRLA, no scleral icterus Mouth/Throat: Oropharynx is clear and moist. No oropharyngeal exudate.  Cardiovascular: Tachycardic, regular rhythm and normal heart sounds. Exam reveals no gallop and no friction rub. No murmur heard.  Pulmonary/Chest: Effort normal but diffuse rhonchi still present Chest wall -right HD catheter has no surrounding erythema and dressing clean and dry Neck = supple, no nuchal rigidity Abdominal: Soft. Bowel sounds are normal.  exhibits no distension. There is no tenderness.  Lymphadenopathy: no cervical adenopathy. No axillary adenopathy Neurological: alert and oriented to person, place, and time.  Skin: Skin is warm and dry. No rash noted. No erythema.  Psychiatric: a normal mood and affect.  behavior is normal.    Lab Results Recent Labs    05/24/17 0830 05/25/17 0421  WBC 19.3* 19.7*  HGB 11.8* 11.7*  HCT 36.5 35.3*  NA 140 139  K 4.2 4.1  CL 101 99*  CO2 23 28  BUN 53* 29*  CREATININE 7.16* 4.64*   Liver Panel Recent Labs    05/24/17 0830  ALBUMIN 2.2*    Microbiology: Blood cx 05/21/17 >> MRSA in 1 of 4 bottles (Vanc MIC < 0.5) Blood cx 2/20 >> pending  Sputum cx  05/21/17 >> MRSA   Studies/Results: Ir Removal  Tun Cv Cath W/o Fl  Result Date: 05/24/2017 INDICATION: History of MRSA bacteremia. Patient has a 61-year-old tunneled hemodialysis catheter. Unsure of where was placed or exactly when it was placed. Due to her bacteremia, a request has been made for removal of her HD catheter. EXAM: REMOVAL TUNNELED CENTRAL VENOUS CATHETER MEDICATIONS: 1% lidocaine with epinephrine; ANESTHESIA/SEDATION: None FLUOROSCOPY TIME:  Fluoroscopy Time: 0 minutes 0 seconds (0 mGy). COMPLICATIONS: None immediate. PROCEDURE: Informed written consent was obtained from the patient after a thorough discussion of the procedural risks, benefits and alternatives. All questions were addressed. Maximal Sterile Barrier Technique  was utilized including caps, mask, sterile gowns, sterile gloves, sterile drape, hand hygiene and skin antiseptic. A timeout was performed prior to the initiation of the procedure. The patient's right chest and catheter was prepped and draped in a normal sterile fashion. Heparin was removed from both ports of catheter. 1% lidocaine with epinephrine was used for local anesthesia. Using gentle blunt and sharp dissection the cuff of the catheter was exposed and the catheter was removed in it's entirety. Pressure was held till hemostasis was obtained. A sterile dressing was applied. The patient tolerated the procedure well with no immediate complications. IMPRESSION: Successful catheter removal as described above. Read by: Saverio Danker, PA-C Electronically Signed   By: Markus Daft M.D.   On: 05/24/2017 15:24     Assessment/Plan: 77yo F with ESRD on HD via right IJ tunneled cathter having influenza complication with secondary MRSA pneumonia and bacteremia 1/4 bottles. Her HD catheter has been in place for 2 years and previously was functioning without complication.   MRSA Bacteremia = HD catheter pulled yesterday afternoon and BCx repeated late yesterday evening which are still pending and have not been updated yet. Planning on TEE for completeness of work up in this high risk patient with long-standing [name] catheter and SAB.   MRSA Pneumonia = down to 2 L of oxygen and feeling a little better. Continue Vancomycin and supportive care.   Janene Madeira, MSN, NP-C [facility] for Infectious [place] Cell: [phone] Pager: [phone]  05/25/2017, 11:34 AM

## 2017-05-25 NOTE — Consult Note (Signed)
Chief Complaint: ESRD   Referring Physician: Dr. Roney Jaffe  Supervising Physician: Jacqulynn Cadet  Patient Status: [facility] - In-pt  HPI: [name] is a 77 y.o. female who was admitted secondary to the flu.  In the interim she was found to have MRSA bacteremia.  She is unable to have HD access via a fistula or graft apparently and has had a tunneled HD cath for the last 2 years.  Given the concern for line contamination from bacteremia, her line was removed yesterday.  Request has been made for replacement of a catheter for HD access.    Past Medical History:  Past Medical History:  Diagnosis Date  . A-fib ([place])   . Acute respiratory failure ([place]) 05/2017  . Anemia   . Arthritis   . Diabetes mellitus    "diet controlled"  . Diabetes mellitus without complication ([place])   . ESRD (end stage renal disease) ([place])    dialysis MWF  . GERD (gastroesophageal reflux disease)   . Gout   . History of blood transfusion   . HLD (hyperlipidemia)   . HOH (hard of hearing)    left ear  . Hypertension    hypotensive -since starting dialysis  . Hypothyroidism   . PAF (paroxysmal atrial fibrillation) ([place])    a. Echo 11/16:  Mild LVH, EF 55-60%, normal wall motion, MAC, mild MR, severe LAE (49 ml/m2), mild RVE, normal RVSF, mild RAE, mild TR, PASP 24 mmHg;  CHADS2-VASc: 4 >> Coumadin followed by PCP  . Renal disorder     Past Surgical History:  Past Surgical History:  Procedure Laterality Date  . AV FISTULA PLACEMENT  04/03/2012   Procedure: ARTERIOVENOUS (AV) FISTULA CREATION;  Surgeon: Elam Dutch, MD;  Location: [place];  Service: Vascular;  Laterality: Left;  creation left brachial cephalic fistula   . CARPAL TUNNEL RELEASE Left   . COLONOSCOPY W/ POLYPECTOMY    . DILATION AND CURETTAGE OF UTERUS    . EYE SURGERY Bilateral    bilateral cataract removal  . Hemodialysis  catheter Right   . IR REMOVAL TUN CV CATH W/O FL  05/24/2017  . JOINT REPLACEMENT Bilateral    bilateral knee  . JOINT REPLACEMENT Right    shoulder  . LIGATION OF ARTERIOVENOUS  FISTULA Left 02/04/2015   Procedure: LIGATION OF BRACHIOCEPHALIC ARTERIOVENOUS  FISTULA;  Surgeon: Conrad [place], MD;  Location: [place];  Service: Vascular;  Laterality: Left;  . PORTACATH PLACEMENT    . REVERSE SHOULDER ARTHROPLASTY Left 01/22/2016   Procedure: LEFT REVERSE SHOULDER ARTHROPLASTY;  Surgeon: Netta Cedars, MD;  Location: [place];  Service: Orthopedics;  Laterality: Left;  . SPLIT NIGHT STUDY  07/26/2015  . STERIOD INJECTION Right 01/22/2016   Procedure: RIGHT RING FINGER STEROID INJECTION;  Surgeon: Netta Cedars, MD;  Location: [place];  Service: Orthopedics;  Laterality: Right;  . THROMBECTOMY BRACHIAL ARTERY Left 02/06/2015   Procedure: EVACUATION OF LEFT ARM HEMATOMA;  Surgeon: Angelia Mould, MD;  Location: [place];  Service: Vascular;  Laterality: Left;  . TOTAL KNEE ARTHROPLASTY     right knee  . TUBAL LIGATION      Family History:  Family History  Problem Relation Age of Onset  . Diabetes Father        before age 68  . Heart disease Father   . Diabetes Sister   . Cancer Brother   . Hyperlipidemia Daughter   . Hypertension Daughter   . Hypertension Son   .  Lung cancer Sister   . Cancer Sister     Social History:  reports that she quit smoking about 19 years ago. Her smoking use included cigarettes. She has a 0.50 pack-year smoking history. she has never used smokeless tobacco. She reports that she does not drink alcohol or use drugs.  Allergies:  Allergies  Allergen Reactions  . Amoxicillin Rash    Has patient had a PCN reaction causing immediate rash, facial/tongue/throat swelling, SOB or lightheadedness with hypotension:YES Has patient had a PCN reaction causing severe rash involving mucus membranes or skin necrosis: Yes Has patient had a PCN reaction that required hospitalization No Has patient had a PCN reaction occurring within the last 10 years: Yes If all of the above  answers are "NO", then may proceed with Cephalosporin use.   . Sulfa Drugs Cross Reactors Hives and Itching  . Percocet [Oxycodone-Acetaminophen] Itching and Rash    Did not happen last time she took it  . Sulfa Antibiotics Hives    Medications: Medications reviewed in epic  Please HPI for pertinent positives, otherwise complete 10 system ROS negative.  Mallampati Score: MD Evaluation Airway: WNL Heart: Other (comments) Heart  comments: tachy Abdomen: WNL Chest/ Lungs: Other (comments) Chest/ lungs comments: bilateral rhonchi ASA  Classification: 3 Mallampati/Airway Score: One  Physical Exam: BP (!) 135/91 (BP Location: Left Arm)   Pulse (!) 120   Temp 98.8 F (37.1 C) (Oral)   Resp 15   Ht 5' 5"  (1.651 m)   Wt 195 lb 12.8 oz (88.8 kg)   SpO2 93%   BMI 32.58 kg/m  Body mass index is 32.58 kg/m. General: pleasant,elderly, WD, WN white female who is laying in bed in NAD HEENT: head is normocephalic, atraumatic.  Sclera are noninjected.  PERRL.  Ears and nose without any masses or lesions.  Mouth is pink and moist Heart: regular, but tachycardic.  Normal s1,s2. No obvious murmurs, gallops, or rubs noted.  Palpable radial pulses bilaterally Lungs: diffuse rhonchi, respiratory effort nonlabored, but just finished a breathing treatment and has O2 via [place] running. Abd: soft, NT, ND, +BS, no masses, hernias, or organomegaly Psych: A&Ox3 with an appropriate affect.   Labs: Results for orders placed or performed during the hospital encounter of 05/21/17 (from the past 48 hour(s))  Glucose, capillary     Status: Abnormal   Collection Time: 05/23/17 11:32 AM  Result Value Ref Range   Glucose-Capillary 144 (H) 65 - 99 mg/dL  Glucose, capillary     Status: Abnormal   Collection Time: 05/23/17  4:08 PM  Result Value Ref Range   Glucose-Capillary 137 (H) 65 - 99 mg/dL  Glucose, capillary     Status: Abnormal   Collection Time: 05/23/17  9:16 PM  Result Value Ref Range    Glucose-Capillary 115 (H) 65 - 99 mg/dL  Renal function panel     Status: Abnormal   Collection Time: 05/24/17  8:30 AM  Result Value Ref Range   Sodium 140 135 - 145 mmol/L   Potassium 4.2 3.5 - 5.1 mmol/L   Chloride 101 101 - 111 mmol/L   CO2 23 22 - 32 mmol/L   Glucose, Bld 95 65 - 99 mg/dL   BUN 53 (H) 6 - 20 mg/dL   Creatinine, Ser 7.16 (H) 0.44 - 1.00 mg/dL   Calcium 7.5 (L) 8.9 - 10.3 mg/dL   Phosphorus 3.0 2.5 - 4.6 mg/dL   Albumin 2.2 (L) 3.5 - 5.0 g/dL   GFR calc non  Af Amer 5 (L) >60 mL/min   GFR calc Af Amer 6 (L) >60 mL/min    Comment: (NOTE) The eGFR has been calculated using the CKD EPI equation. This calculation has not been validated in all clinical situations. eGFR's persistently <60 mL/min signify possible Chronic Kidney Disease.    Anion gap 16 (H) 5 - 15    Comment: Performed at [facility], [place] [address] [place], [place] [address]  CBC     Status: Abnormal   Collection Time: 05/24/17  8:30 AM  Result Value Ref Range   WBC 19.3 (H) 4.0 - 10.5 K/uL   RBC 3.76 (L) 3.87 - 5.11 MIL/uL   Hemoglobin 11.8 (L) 12.0 - 15.0 g/dL   HCT 36.5 36.0 - 46.0 %   MCV 97.1 78.0 - 100.0 fL   MCH 31.4 26.0 - 34.0 pg   MCHC 32.3 30.0 - 36.0 g/dL   RDW 16.9 (H) 11.5 - 15.5 %   Platelets 248 150 - 400 K/uL    Comment: Performed at [facility], [place] [address], [place], [place] [address]  Glucose, capillary     Status: None   Collection Time: 05/24/17 12:22 PM  Result Value Ref Range   Glucose-Capillary 87 65 - 99 mg/dL   Comment 1 Notify RN   Glucose, capillary     Status: Abnormal   Collection Time: 05/24/17  4:55 PM  Result Value Ref Range   Glucose-Capillary 166 (H) 65 - 99 mg/dL  MRSA PCR Screening     Status: Abnormal   Collection Time: 05/24/17  7:28 PM  Result Value Ref Range   MRSA by PCR POSITIVE (A) NEGATIVE    Comment:        The GeneXpert MRSA Assay (FDA approved for NASAL specimens only), is one component of a comprehensive MRSA  colonization surveillance program. It is not intended to diagnose MRSA infection nor to guide or monitor treatment for MRSA infections. RESULT CALLED TO, READ BACK BY AND VERIFIED WITH: D.RODGERS AT 0157 05/25/17 BY L.PITT   Glucose, capillary     Status: Abnormal   Collection Time: 05/24/17  9:30 PM  Result Value Ref Range   Glucose-Capillary 134 (H) 65 - 99 mg/dL  Basic metabolic panel     Status: Abnormal   Collection Time: 05/25/17  4:21 AM  Result Value Ref Range   Sodium 139 135 - 145 mmol/L   Potassium 4.1 3.5 - 5.1 mmol/L   Chloride 99 (L) 101 - 111 mmol/L   CO2 28 22 - 32 mmol/L   Glucose, Bld 117 (H) 65 - 99 mg/dL   BUN 29 (H) 6 - 20 mg/dL   Creatinine, Ser 4.64 (H) 0.44 - 1.00 mg/dL    Comment: DELTA CHECK NOTED   Calcium 7.7 (L) 8.9 - 10.3 mg/dL   GFR calc non Af Amer 8 (L) >60 mL/min   GFR calc Af Amer 10 (L) >60 mL/min    Comment: (NOTE) The eGFR has been calculated using the CKD EPI equation. This calculation has not been validated in all clinical situations. eGFR's persistently <60 mL/min signify possible Chronic Kidney Disease.    Anion gap 12 5 - 15    Comment: Performed at [facility] [address], [place], [place] [address]  Magnesium     Status: None   Collection Time: 05/25/17  4:21 AM  Result Value Ref Range   Magnesium 1.9 1.7 - 2.4 mg/dL    Comment:  Performed at [facility], [place] [name] [place], [place] [address]  CBC     Status: Abnormal   Collection Time: 05/25/17  4:21 AM  Result Value Ref Range   WBC 19.7 (H) 4.0 - 10.5 K/uL   RBC 3.68 (L) 3.87 - 5.11 MIL/uL   Hemoglobin 11.7 (L) 12.0 - 15.0 g/dL   HCT 35.3 (L) 36.0 - 46.0 %   MCV 95.9 78.0 - 100.0 fL   MCH 31.8 26.0 - 34.0 pg   MCHC 33.1 30.0 - 36.0 g/dL   RDW 16.6 (H) 11.5 - 15.5 %   Platelets 237 150 - 400 K/uL    Comment: Performed at [facility], [place] [address], [place], [place] [address]  Hemoglobin A1c     Status: Abnormal   Collection Time: 05/25/17   4:21 AM  Result Value Ref Range   Hgb A1c MFr Bld 4.5 (L) 4.8 - 5.6 %    Comment: (NOTE) Pre diabetes:          5.7%-6.4% Diabetes:              >6.4% Glycemic control for   <7.0% adults with diabetes    Mean Plasma Glucose 82.45 mg/dL    Comment: Performed at [facility] [address] [place], [place] [address]  Lipid panel     Status: Abnormal   Collection Time: 05/25/17  4:21 AM  Result Value Ref Range   Cholesterol 107 0 - 200 mg/dL   Triglycerides 88 <150 mg/dL   HDL 32 (L) >40 mg/dL   Total CHOL/HDL Ratio 3.3 RATIO   VLDL 18 0 - 40 mg/dL   LDL Cholesterol 57 0 - 99 mg/dL    Comment:        Total Cholesterol/HDL:CHD Risk Coronary Heart Disease Risk Table                     Men   Women  1/2 Average Risk   3.4   3.3  Average Risk       5.0   4.4  2 X Average Risk   9.6   7.1  3 X Average Risk  23.4   11.0        Use the calculated Patient Ratio above and the CHD Risk Table to determine the patient's CHD Risk.        ATP III CLASSIFICATION (LDL):  <100     mg/dL   Optimal  100-129  mg/dL   Near or Above                    Optimal  130-159  mg/dL   Borderline  160-189  mg/dL   High  >190     mg/dL   Very High Performed at [facility] [address], [place], [place] [address]   Protime-INR     Status: Abnormal   Collection Time: 05/25/17  4:21 AM  Result Value Ref Range   Prothrombin Time 23.0 (H) 11.4 - 15.2 seconds   INR 2.05     Comment: Performed at [facility] [address], [place], [place] [address]  Glucose, capillary     Status: Abnormal   Collection Time: 05/25/17  7:44 AM  Result Value Ref Range   Glucose-Capillary 101 (H) 65 - 99 mg/dL    Imaging: Ir Removal Tun Cv Cath W/o Fl  Result Date: 05/24/2017 INDICATION: History of MRSA bacteremia.  Patient has a 81-year-old tunneled hemodialysis catheter. Unsure of where was placed or exactly when it was placed. Due to her bacteremia, a request has been made for removal of her  HD catheter. EXAM: REMOVAL TUNNELED CENTRAL VENOUS CATHETER MEDICATIONS: 1% lidocaine with epinephrine; ANESTHESIA/SEDATION: None FLUOROSCOPY TIME:  Fluoroscopy Time: 0 minutes 0 seconds (0 mGy). COMPLICATIONS: None immediate. PROCEDURE: Informed written consent was obtained from the patient after a thorough discussion of the procedural risks, benefits and alternatives. All questions were addressed. Maximal Sterile Barrier Technique was utilized including caps, mask, sterile gowns, sterile gloves, sterile drape, hand hygiene and skin antiseptic. A timeout was performed prior to the initiation of the procedure. The patient's right chest and catheter was prepped and draped in a normal sterile fashion. Heparin was removed from both ports of catheter. 1% lidocaine with epinephrine was used for local anesthesia. Using gentle blunt and sharp dissection the cuff of the catheter was exposed and the catheter was removed in it's entirety. Pressure was held till hemostasis was obtained. A sterile dressing was applied. The patient tolerated the procedure well with no immediate complications. IMPRESSION: Successful catheter removal as described above. Read by: Saverio Danker, PA-C Electronically Signed   By: Markus Daft M.D.   On: 05/24/2017 15:24    Assessment/Plan 1. ESRD  Patient had her original perm cath removed yesterday.  She is being given a line holiday currently.  She is undergoing treatment for the flu as well as her bacteremia.  Repeat blood cultures are pending.  WBC is still 19K.  Temp over night of 100.6. INR this am is still 2.0.  If unable to place a perm cath secondary to above reasons, we will place a temp cath that can later be converted with her labs, temp, INR improved.  However, if repeat cultures are negative for 24hrs, afebrile, decreasing WBC, and INR around 1.5, then we will proceed with replacement of a perm cath.  Risks and benefits discussed with the patient including, but not limited to  bleeding, infection, vascular injury, pneumothorax which may require chest tube placement, air embolism or even death  All of the patient's questions were answered, patient is agreeable to proceed. Consent signed and in chart.    Thank you for this interesting consult.  I greatly enjoyed meeting INGER WIEST and look forward to participating in their care.  A copy of this report was sent to the requesting provider on this date.  Electronically Signed: Henreitta Cea 05/25/2017, 9:27 AM   I spent a total of 40 Minutes    in face to face in clinical consultation, greater than 50% of which was counseling/coordinating care for ESRD

## 2017-05-25 NOTE — Progress Notes (Signed)
    [facility] has been requested to perform a transesophageal echocardiogram on 05/26/17 for bacteremia.  After careful review of history and examination, the risks and benefits of transesophageal echocardiogram have been explained including risks of esophageal damage, perforation (1:10,000 risk), bleeding, pharyngeal hematoma as well as other potential complications associated with conscious sedation including aspiration, arrhythmia, respiratory failure and death. Alternatives to treatment were discussed, questions were answered. Patient is willing to proceed.   Roby Lofts, PA-C 05/25/2017 12:17 PM

## 2017-05-25 NOTE — Progress Notes (Signed)
PROGRESS NOTE    [name]  BJS:[id number] DOB: Jan 12, 1941 DOA: 05/21/2017 PCP: Dione Housekeeper, MD   Brief Narrative:  [name] PMHx ESRD on HD M/W/F Paroxysmal  A-Fib , HTN, Hypothyroidism, and DM Type 2    Brought to the ER w/ persistent shortness of breath and right-sided chest pain.  She was started on antibiotics and prednisone on February 11 by her PCP, and also treated w/ Tamiflu     Subjective: 2/21 A/O 4, positive SOB, negative abdominal pain, negative N/V. Positive duct cough (yellow).    Assessment & Plan:   Principal Problem:   Acute respiratory failure with hypoxia (HCC) Active Problems:   Hypothyroidism   Anemia   Atrial fibrillation (HCC)  Acute Respiratory Failure with Hypoxia/positive MRSA pneumonia -Continue current antibiotics -Budesonide nebulizer BID -Ipratropium nebulizer TID -Xopenex nebulizer PRN -Flutter valve -Aggressive pulmonary toilet   Positive MRSA bacteremia - continue current course of antibiotics -2/20 S/P HD cath removed -TEE pending. Spoke with Trish from Cardiology and she will schedule patient for TEE on 2/22   Sinus Tachycardia -Most likely secondary to bacteremia/pneumonia.  -Asymptomatic -TSH normal   Chronic paroxysmal atrial fibrillation -Currently in NSR -Coumadin per pharmacy Recent Labs  Lab 05/21/17 1750 05/22/17 0303 05/23/17 0349 05/25/17 0421  INR 4.43* 5.30* 4.39* 2.05  -2/20 supratherapeutic. Hold Coumadin  -2/21 vitamin K IV 1 mg. Nephrology to place a tunneled HD cath on 2/22 in the AM and would like INR< 1.8  Chronic Hypotension  -Midodrin 10 mg with HD    ESRD on  HD M/W/F  -HD per nephrology   Hypothyroidism -Synthroid 100 g daily  Diabetes type 2 controlled with complication -6/0/7371 Hemoglobin A1c= 5.4 -2/21 Hemoglobin A1c= 4.5 -Lipid panel within ADA guidelines    DVT prophylaxis: Coumadin Code Status: DO NOT RESUSCITATE Family Communication: Family at bedside for discussion  of plan of care Disposition Plan: TBD   Consultants:  Nephrology ID   Procedures/Significant Events:     I have personally reviewed and interpreted all radiology studies and my findings are as above.  VENTILATOR SETTINGS:    Cultures 2/17 Blood Positive MRSA  2/17 Sputum Positive MRSA    Antimicrobials: Anti-infectives (From admission, onward)   Start     Stop   05/24/17 1200  vancomycin (VANCOCIN) IVPB 1000 mg/200 mL premix         05/22/17 1800  vancomycin (VANCOCIN) IVPB 1000 mg/200 mL premix     05/23/17 1315   05/22/17 1604  vancomycin (VANCOCIN) 1-5 GM/200ML-% IVPB    Comments:  Ashley Akin   : cabinet override   05/23/17 0414   05/22/17 0800  aztreonam (AZACTAM) 500 mg in dextrose 5 % 50 mL IVPB  Status:  Discontinued     05/22/17 1710   05/21/17 2100  vancomycin (VANCOCIN) IVPB 1000 mg/200 mL premix     05/21/17 2243   05/21/17 1845  aztreonam (AZACTAM) 2 g in sodium chloride 0.9 % 100 mL IVPB     05/21/17 2032   05/21/17 1845  vancomycin (VANCOCIN) IVPB 1000 mg/200 mL premix     05/21/17 2122       Devices    LINES / TUBES:  Tunneled HD cath??>> 2/20    Continuous Infusions: . vancomycin Stopped (05/24/17 1300)     Objective: Vitals:   05/25/17 0448 05/25/17 0742 05/25/17 0825 05/25/17 0829  BP: (!) 141/80 (!) 135/91    Pulse: (!) 123 (!) 120    Resp: 15  15    Temp: 98.8 F (37.1 C) 98.8 F (37.1 C)    TempSrc: Oral Oral    SpO2: 93% 93% 95% 93%  Weight: 195 lb 12.8 oz (88.8 kg)     Height:        Intake/Output Summary (Last 24 hours) at 05/25/2017 0834 Last data filed at 05/24/2017 2200 Gross per 24 hour  Intake 1020 ml  Output 758 ml  Net 262 ml   Filed Weights   05/24/17 0730 05/24/17 1152 05/25/17 0448  Weight: 197 lb 1.5 oz (89.4 kg) 195 lb 12.3 oz (88.8 kg) 195 lb 12.8 oz (88.8 kg)    Physical Exam:  General: A/O 4, positive acute respiratory distress Neck:  Negative scars, masses, torticollis, lymphadenopathy,  JVD Lungs: positive diffuse rhonchi (improved), negative crackles, negative expiratory wheeze.  Cardiovascular: Regular rate and rhythm without murmur gallop or rub normal S1 and S2 Abdomen: negative abdominal pain, nondistended, positive soft, bowel sounds, no rebound, no ascites, no appreciable mass Extremities: No significant cyanosis, clubbing, or edema bilateral lower extremities Skin: Negative rashes, lesions, ulcers Psychiatric:  Negative depression, negative anxiety, negative fatigue, negative mania  Central nervous system:  Cranial nerves II through XII intact, tongue/uvula midline, all extremities muscle strength 5/5, sensation intact throughout,  negative dysarthria, negative expressive aphasia, negative receptive aphasia.  .     Data Reviewed: Care during the described time interval was provided by me .  I have reviewed this patient's available data, including medical history, events of note, physical examination, and all test results as part of my evaluation.   CBC: Recent Labs  Lab 05/21/17 1750 05/22/17 0303 05/24/17 0830 05/25/17 0421  WBC 19.6* 17.0* 19.3* 19.7*  NEUTROABS 17.3*  --   --   --   HGB 13.6 12.7 11.8* 11.7*  HCT 42.6 39.2 36.5 35.3*  MCV 98.6 97.3 97.1 95.9  PLT 216 198 248 761   Basic Metabolic Panel: Recent Labs  Lab 05/21/17 1750 05/22/17 0303 05/24/17 0830 05/25/17 0421  NA 138 135 140 139  K 4.0 3.8 4.2 4.1  CL 98* 97* 101 99*  CO2 22 20* 23 28  GLUCOSE 128* 163* 95 117*  BUN 54* 62* 53* 29*  CREATININE 7.80* 8.15* 7.16* 4.64*  CALCIUM 6.7* 6.3* 7.5* 7.7*  MG  --   --   --  1.9  PHOS  --   --  3.0  --    GFR: Estimated Creatinine Clearance: 11.2 mL/min (A) (by C-G formula based on SCr of 4.64 mg/dL (H)). Liver Function Tests: Recent Labs  Lab 05/21/17 1750 05/24/17 0830  AST 24  --   ALT 20  --   ALKPHOS 103  --   BILITOT 1.0  --   PROT 6.0*  --   ALBUMIN 3.1* 2.2*   No results for input(s): LIPASE, AMYLASE in the last  168 hours. No results for input(s): AMMONIA in the last 168 hours. Coagulation Profile: Recent Labs  Lab 05/21/17 1750 05/22/17 0303 05/23/17 0349 05/25/17 0421  INR 4.43* 5.30* 4.39* 2.05   Cardiac Enzymes: Recent Labs  Lab 05/21/17 2250 05/22/17 0303 05/22/17 1738  TROPONINI 0.03* 0.03* 0.26*   BNP (last 3 results) No results for input(s): PROBNP in the last 8760 hours. HbA1C: Recent Labs    05/25/17 0421  HGBA1C 4.5*   CBG: Recent Labs  Lab 05/23/17 2116 05/24/17 1222 05/24/17 1655 05/24/17 2130 05/25/17 0744  GLUCAP 115* 87 166* 134* 101*   Lipid Profile: Recent  Labs    05/25/17 0421  CHOL 107  HDL 32*  LDLCALC 57  TRIG 88  CHOLHDL 3.3   Thyroid Function Tests: No results for input(s): TSH, T4TOTAL, FREET4, T3FREE, THYROIDAB in the last 72 hours. Anemia Panel: No results for input(s): VITAMINB12, FOLATE, FERRITIN, TIBC, IRON, RETICCTPCT in the last 72 hours. Urine analysis:    Component Value Date/Time   COLORURINE YELLOW 07/26/2013 1202   APPEARANCEUR CLEAR 07/26/2013 1202   LABSPEC 1.016 07/26/2013 1202   PHURINE 7.0 07/26/2013 1202   GLUCOSEU NEGATIVE 07/26/2013 1202   HGBUR SMALL (A) 07/26/2013 1202   BILIRUBINUR NEGATIVE 07/26/2013 1202   KETONESUR NEGATIVE 07/26/2013 1202   PROTEINUR 100 (A) 07/26/2013 1202   UROBILINOGEN 1.0 07/26/2013 1202   NITRITE NEGATIVE 07/26/2013 1202   LEUKOCYTESUR SMALL (A) 07/26/2013 1202   Sepsis Labs: @LABRCNTIP (procalcitonin:4,lacticidven:4)  ) Recent Results (from the past 240 hour(s))  Blood Culture (routine x 2)     Status: Abnormal   Collection Time: 05/21/17  6:41 PM  Result Value Ref Range Status   Specimen Description BLOOD RIGHT FOREARM  Final   Special Requests   Final    BOTTLES DRAWN AEROBIC AND ANAEROBIC Blood Culture adequate volume   Culture  Setup Time   Final    GRAM POSITIVE COCCI ANAEROBIC BOTTLE ONLY CRITICAL RESULT CALLED TO, READ BACK BY AND VERIFIED WITHEzekiel Slocumb PHARMD  1508 05/22/17 A BROWNING Performed at [facility], [place] [address] [place], [place] [address]    Culture METHICILLIN RESISTANT STAPHYLOCOCCUS AUREUS (A)  Final   Report Status 05/24/2017 FINAL  Final   Organism ID, Bacteria METHICILLIN RESISTANT STAPHYLOCOCCUS AUREUS  Final      Susceptibility   Methicillin resistant staphylococcus aureus - MIC*    CIPROFLOXACIN >=8 RESISTANT Resistant     ERYTHROMYCIN >=8 RESISTANT Resistant     GENTAMICIN <=0.5 SENSITIVE Sensitive     OXACILLIN >=4 RESISTANT Resistant     TETRACYCLINE <=1 SENSITIVE Sensitive     VANCOMYCIN <=0.5 SENSITIVE Sensitive     TRIMETH/SULFA <=10 SENSITIVE Sensitive     CLINDAMYCIN <=0.25 SENSITIVE Sensitive     RIFAMPIN <=0.5 SENSITIVE Sensitive     Inducible Clindamycin NEGATIVE Sensitive     * METHICILLIN RESISTANT STAPHYLOCOCCUS AUREUS  Blood Culture ID Panel (Reflexed)     Status: Abnormal   Collection Time: 05/21/17  6:41 PM  Result Value Ref Range Status   Enterococcus species NOT DETECTED NOT DETECTED Final   Listeria monocytogenes NOT DETECTED NOT DETECTED Final   Staphylococcus species DETECTED (A) NOT DETECTED Final    Comment: CRITICAL RESULT CALLED TO, READ BACK BY AND VERIFIED WITH: E SINCLAIR PHARMD 1508 05/22/17 A BROWNING    Staphylococcus aureus DETECTED (A) NOT DETECTED Final    Comment: Methicillin (oxacillin)-resistant Staphylococcus aureus (MRSA). MRSA is predictably resistant to beta-lactam antibiotics (except ceftaroline). Preferred therapy is vancomycin unless clinically contraindicated. Patient requires contact precautions if  hospitalized. CRITICAL RESULT CALLED TO, READ BACK BY AND VERIFIED WITH: E SINCLAIR PHARMD 1508 05/22/17 A BROWNING    Methicillin resistance DETECTED (A) NOT DETECTED Final    Comment: CRITICAL RESULT CALLED TO, READ BACK BY AND VERIFIED WITH: E SINCLAIR PHARMD 1508 05/22/17 A BROWNING    Streptococcus species NOT DETECTED NOT DETECTED Final   Streptococcus  agalactiae NOT DETECTED NOT DETECTED Final   Streptococcus pneumoniae NOT DETECTED NOT DETECTED Final   Streptococcus pyogenes NOT DETECTED NOT DETECTED Final   Acinetobacter baumannii NOT DETECTED NOT DETECTED Final  Enterobacteriaceae species NOT DETECTED NOT DETECTED Final   Enterobacter cloacae complex NOT DETECTED NOT DETECTED Final   Escherichia coli NOT DETECTED NOT DETECTED Final   Klebsiella oxytoca NOT DETECTED NOT DETECTED Final   Klebsiella pneumoniae NOT DETECTED NOT DETECTED Final   Proteus species NOT DETECTED NOT DETECTED Final   Serratia marcescens NOT DETECTED NOT DETECTED Final   Haemophilus influenzae NOT DETECTED NOT DETECTED Final   Neisseria meningitidis NOT DETECTED NOT DETECTED Final   Pseudomonas aeruginosa NOT DETECTED NOT DETECTED Final   Candida albicans NOT DETECTED NOT DETECTED Final   Candida glabrata NOT DETECTED NOT DETECTED Final   Candida krusei NOT DETECTED NOT DETECTED Final   Candida parapsilosis NOT DETECTED NOT DETECTED Final   Candida tropicalis NOT DETECTED NOT DETECTED Final    Comment: Performed at [facility], [place] [address], [place], [place] [address]  Blood Culture (routine x 2)     Status: None (Preliminary result)   Collection Time: 05/21/17  6:46 PM  Result Value Ref Range Status   Specimen Description BLOOD LEFT ANTECUBITAL  Final   Special Requests   Final    BOTTLES DRAWN AEROBIC AND ANAEROBIC Blood Culture adequate volume   Culture   Final    NO GROWTH 3 DAYS Performed at [facility], [place] [address] [place], [place] [address]    Report Status PENDING  Incomplete  Culture, expectorated sputum-assessment     Status: None   Collection Time: 05/21/17  7:03 PM  Result Value Ref Range Status   Specimen Description SPUTUM  Final   Special Requests NONE  Final   Sputum evaluation   Final    THIS SPECIMEN IS ACCEPTABLE FOR SPUTUM CULTURE Performed at [facility], [address], [place], [place] [address]     Report Status 05/22/2017 FINAL  Final  Culture, respiratory (NON-Expectorated)     Status: None   Collection Time: 05/21/17  7:03 PM  Result Value Ref Range Status   Specimen Description SPUTUM  Final   Special Requests NONE Reflexed from [id number]  Final   Gram Stain   Final    FEW WBC PRESENT, PREDOMINANTLY PMN ABUNDANT GRAM POSITIVE COCCI IN CLUSTERS MODERATE GRAM NEGATIVE RODS FEW YEAST Performed at [facility], [address], [place], [place] [address]    Culture   Final    ABUNDANT METHICILLIN RESISTANT STAPHYLOCOCCUS AUREUS   Report Status 05/24/2017 FINAL  Final   Organism ID, Bacteria METHICILLIN RESISTANT STAPHYLOCOCCUS AUREUS  Final      Susceptibility   Methicillin resistant staphylococcus aureus - MIC*    CIPROFLOXACIN >=8 RESISTANT Resistant     ERYTHROMYCIN >=8 RESISTANT Resistant     GENTAMICIN <=0.5 SENSITIVE Sensitive     OXACILLIN >=4 RESISTANT Resistant     TETRACYCLINE <=1 SENSITIVE Sensitive     VANCOMYCIN <=0.5 SENSITIVE Sensitive     TRIMETH/SULFA <=10 SENSITIVE Sensitive     CLINDAMYCIN <=0.25 SENSITIVE Sensitive     RIFAMPIN <=0.5 SENSITIVE Sensitive     Inducible Clindamycin NEGATIVE Sensitive     * ABUNDANT METHICILLIN RESISTANT STAPHYLOCOCCUS AUREUS  MRSA PCR Screening     Status: Abnormal   Collection Time: 05/24/17  7:28 PM  Result Value Ref Range Status   MRSA by PCR POSITIVE (A) NEGATIVE Final    Comment:        The GeneXpert MRSA Assay (FDA approved for NASAL specimens only), is one component of a comprehensive MRSA colonization surveillance  program. It is not intended to diagnose MRSA infection nor to guide or monitor treatment for MRSA infections. RESULT CALLED TO, READ BACK BY AND VERIFIED WITH: D.RODGERS AT 0157 05/25/17 BY L.PITT          Radiology Studies: Ir Removal Tun Cv Cath W/o Fl  Result Date: 05/24/2017 INDICATION: History of MRSA bacteremia. Patient has a 20-year-old tunneled hemodialysis catheter. Unsure of  where was placed or exactly when it was placed. Due to her bacteremia, a request has been made for removal of her HD catheter. EXAM: REMOVAL TUNNELED CENTRAL VENOUS CATHETER MEDICATIONS: 1% lidocaine with epinephrine; ANESTHESIA/SEDATION: None FLUOROSCOPY TIME:  Fluoroscopy Time: 0 minutes 0 seconds (0 mGy). COMPLICATIONS: None immediate. PROCEDURE: Informed written consent was obtained from the patient after a thorough discussion of the procedural risks, benefits and alternatives. All questions were addressed. Maximal Sterile Barrier Technique was utilized including caps, mask, sterile gowns, sterile gloves, sterile drape, hand hygiene and skin antiseptic. A timeout was performed prior to the initiation of the procedure. The patient's right chest and catheter was prepped and draped in a normal sterile fashion. Heparin was removed from both ports of catheter. 1% lidocaine with epinephrine was used for local anesthesia. Using gentle blunt and sharp dissection the cuff of the catheter was exposed and the catheter was removed in it's entirety. Pressure was held till hemostasis was obtained. A sterile dressing was applied. The patient tolerated the procedure well with no immediate complications. IMPRESSION: Successful catheter removal as described above. Read by: Saverio Danker, PA-C Electronically Signed   By: Markus Daft M.D.   On: 05/24/2017 15:24        Scheduled Meds: . budesonide (PULMICORT) nebulizer solution  0.25 mg Nebulization BID  . Chlorhexidine Gluconate Cloth  6 each Topical Q0600  . cinacalcet  30 mg Oral QODAY  . citalopram  10 mg Oral BID  . doxercalciferol  1 mcg Intravenous Q M,W,F-HD  . ezetimibe  10 mg Oral Daily  . feeding supplement (NEPRO CARB STEADY)  237 mL Oral BID BM  . ferric citrate  420 mg Oral TID AC  . gabapentin  100 mg Oral QHS  . insulin aspart  0-9 Units Subcutaneous TID WC  . ipratropium  0.5 mg Nebulization TID  . levalbuterol  0.63 mg Nebulization TID  .  levothyroxine  100 mcg Oral QAC breakfast  . midodrine  10 mg Oral Q M,W,F-HD  . multivitamin  1 tablet Oral Daily  . mupirocin ointment  1 application Nasal BID  . pantoprazole  40 mg Oral Daily   Continuous Infusions: . vancomycin Stopped (05/24/17 1300)     LOS: 4 days    Time spent: 40 minutes    Rudie Sermons, Geraldo Docker, MD [facility] Pager [phone]   If 7PM-7AM, please contact night-coverage [email or web address] Password [facility] 05/25/2017, 8:34 AM

## 2017-05-25 NOTE — Progress Notes (Addendum)
[name] Progress Note   Subjective: Daughters at bedside, confused about patient being scheduled for TEE tomorrow, afraid something is wrong with her heart. Transthoracic Echo done 05/24/17 did not show vegetation on valves however plan is to get TEE to confirm absence of vegetation. Discussed with ID and primary.       Objective Vitals:   05/25/17 0448 05/25/17 0742 05/25/17 0825 05/25/17 0829  BP: (!) 141/80 (!) 135/91    Pulse: (!) 123 (!) 120    Resp: 15 15    Temp: 98.8 F (37.1 C) 98.8 F (37.1 C)    TempSrc: Oral Oral    SpO2: 93% 93% 95% 93%  Weight: 88.8 kg (195 lb 12.8 oz)     Height:       Physical Exam General: Chronically ill appearing female NAD Heart: S1,S2 M/G/R No JVD HR still 120s. ST and PACS.  Lungs: Bilateral breath sounds still with coarse breath sounds throughout lung fields. Shallow respirations. No pleural rub, no wheezing.  Abdomen: active BS Extremities: No LE edema.  Dialysis Access: NO HD access at present   Additional Objective Labs: Basic Metabolic Panel: Recent Labs  Lab 05/22/17 0303 05/24/17 0830 05/25/17 0421  NA 135 140 139  K 3.8 4.2 4.1  CL 97* 101 99*  CO2 20* 23 28  GLUCOSE 163* 95 117*  BUN 62* 53* 29*  CREATININE 8.15* 7.16* 4.64*  CALCIUM 6.3* 7.5* 7.7*  PHOS  --  3.0  --    Liver Function Tests: Recent Labs  Lab 05/21/17 1750 05/24/17 0830  AST 24  --   ALT 20  --   ALKPHOS 103  --   BILITOT 1.0  --   PROT 6.0*  --   ALBUMIN 3.1* 2.2*   No results for input(s): LIPASE, AMYLASE in the last 168 hours. CBC: Recent Labs  Lab 05/21/17 1750 05/22/17 0303 05/24/17 0830 05/25/17 0421  WBC 19.6* 17.0* 19.3* 19.7*  NEUTROABS 17.3*  --   --   --   HGB 13.6 12.7 11.8* 11.7*  HCT 42.6 39.2 36.5 35.3*  MCV 98.6 97.3 97.1 95.9  PLT 216 198 248 237   Blood Culture    Component Value Date/Time   SDES SPUTUM 05/21/2017 1903   SDES SPUTUM 05/21/2017 1903   SPECREQUEST NONE 05/21/2017 1903    SPECREQUEST NONE Reflexed from [id number] 05/21/2017 1903   CULT  05/21/2017 1903    ABUNDANT METHICILLIN RESISTANT STAPHYLOCOCCUS AUREUS   REPTSTATUS 05/22/2017 FINAL 05/21/2017 1903   REPTSTATUS 05/24/2017 FINAL 05/21/2017 1903    Cardiac Enzymes: Recent Labs  Lab 05/21/17 2250 05/22/17 0303 05/22/17 1738  TROPONINI 0.03* 0.03* 0.26*   CBG: Recent Labs  Lab 05/23/17 2116 05/24/17 1222 05/24/17 1655 05/24/17 2130 05/25/17 0744  GLUCAP 115* 87 166* 134* 101*   Iron Studies: No results for input(s): IRON, TIBC, TRANSFERRIN, FERRITIN in the last 72 hours. @lablastinr3 @ Studies/Results: Ir Removal Tun Cv Cath W/o Fl  Result Date: 05/24/2017 INDICATION: History of MRSA bacteremia. Patient has a 41-year-old tunneled hemodialysis catheter. Unsure of where was placed or exactly when it was placed. Due to her bacteremia, a request has been made for removal of her HD catheter. EXAM: REMOVAL TUNNELED CENTRAL VENOUS CATHETER MEDICATIONS: 1% lidocaine with epinephrine; ANESTHESIA/SEDATION: None FLUOROSCOPY TIME:  Fluoroscopy Time: 0 minutes 0 seconds (0 mGy). COMPLICATIONS: None immediate. PROCEDURE: Informed written consent was obtained from the patient after a thorough discussion of the procedural risks, benefits and alternatives. All questions were  addressed. Maximal Sterile Barrier Technique was utilized including caps, mask, sterile gowns, sterile gloves, sterile drape, hand hygiene and skin antiseptic. A timeout was performed prior to the initiation of the procedure. The patient's right chest and catheter was prepped and draped in a normal sterile fashion. Heparin was removed from both ports of catheter. 1% lidocaine with epinephrine was used for local anesthesia. Using gentle blunt and sharp dissection the cuff of the catheter was exposed and the catheter was removed in it's entirety. Pressure was held till hemostasis was obtained. A sterile dressing was applied. The patient tolerated the  procedure well with no immediate complications. IMPRESSION: Successful catheter removal as described above. Read by: Saverio Danker, PA-C Electronically Signed   By: Markus Daft M.D.   On: 05/24/2017 15:24   Medications: . vancomycin Stopped (05/24/17 1300)   . budesonide (PULMICORT) nebulizer solution  0.25 mg Nebulization BID  . Chlorhexidine Gluconate Cloth  6 each Topical Q0600  . cinacalcet  30 mg Oral QODAY  . citalopram  10 mg Oral BID  . doxercalciferol  1 mcg Intravenous Q M,W,F-HD  . ezetimibe  10 mg Oral Daily  . feeding supplement (NEPRO CARB STEADY)  237 mL Oral BID BM  . ferric citrate  420 mg Oral TID AC  . gabapentin  100 mg Oral QHS  . insulin aspart  0-9 Units Subcutaneous TID WC  . ipratropium  0.5 mg Nebulization TID  . levalbuterol  0.63 mg Nebulization TID  . levothyroxine  100 mcg Oral QAC breakfast  . midodrine  10 mg Oral Q M,W,F-HD  . multivitamin  1 tablet Oral Daily  . mupirocin ointment  1 application Nasal BID  . pantoprazole  40 mg Oral Daily  . warfarin  7.5 mg Oral ONCE-1800  . Warfarin - Pharmacist Dosing Inpatient   Does not apply q1800     Dialysis Orders:[facility] MWF 4 hrs 160 NRe 350/800 manual 89 Kg 2.0 K/2.5 Ca UFP 4 linear sodium -No heparin -Mircera 100 mcg IV q 2 weeks (last dose 05/08/17 HGB 12.7 05/17/17) -Venofer 50 mg IV weekly (last dose 05/15/17) -Hectorol 1 mcg IV TIW  Assessment/Plan: 1. Acute respiratory failure: basilar/ RML PNA w/ +MRSA in blood and sputum. Also flu A + pneumonitis.Per primary/ID.  2. MRSA Bacteremia: Per Primary/ID. On Vanc.TDC removed 05/24/17, wiill have line holiday until 05/26/17. Confirmed with IR pt is on schedule. May get temporary cath if INR not less than 1.5. Echo done 05/24/17, no vegetation reported. Repeat BC pending. Going for TEE tomorrow.  3. ESRD - MWF-HD 05/26/17 after TDC replaced. No heparin. K+ 4.1  2.0 K bath. 4. Hypertension/volume - HD 02/20 Net UF 758 Post wt 88.8 Wt. Slightly  under EDW. Minimal UF with HD tomorrow. BP more stable today. Midodrine 10 mg PO TIW with HD.  5. Anemia - HGB 11.7 No ESA needed. 6. Metabolic bone disease - Ca 7.5 C Ca 9.0 Phos 3.0 Continue binders, VDRA, sensipar. 7. Nutrition - Albumin 2.2 in the setting of sepsis. Renal/Carb. Renal vit nepro. Add prostat.  8. H/O Afib:Coumadin on hold. INR 05/23/17 4.39. Per pharmacy. 9. DM: per primary    Michelle H. Brown NP-C 05/25/2017, 11:08 AM  [facility] [phone]  Pt seen, examined and agree w A/P as above. Have d/w primary about INR correction, appreciate assistance.  If f/u blood cx's are negative and INR ok would prefer TDC to a temporary HD cath.  Michelle Splinter MD [employer] pager [phone]  05/25/2017, 1:25 PM

## 2017-05-25 NOTE — Progress Notes (Signed)
Flutter Valve at bedside.

## 2017-05-25 NOTE — H&P (View-Only) (Signed)
PROGRESS NOTE    [name]  FAO:[id number] DOB: Feb 23, 1941 DOA: 05/21/2017 PCP: Dione Housekeeper, MD   Brief Narrative:  [name] PMHx ESRD on HD M/W/F Paroxysmal  A-Fib , HTN, Hypothyroidism, and DM Type 2    Brought to the ER w/ persistent shortness of breath and right-sided chest pain.  She was started on antibiotics and prednisone on February 11 by her PCP, and also treated w/ Tamiflu     Subjective: 2/21 A/O 4, positive SOB, negative abdominal pain, negative N/V. Positive duct cough (yellow).    Assessment & Plan:   Principal Problem:   Acute respiratory failure with hypoxia (HCC) Active Problems:   Hypothyroidism   Anemia   Atrial fibrillation (HCC)  Acute Respiratory Failure with Hypoxia/positive MRSA pneumonia -Continue current antibiotics -Budesonide nebulizer BID -Ipratropium nebulizer TID -Xopenex nebulizer PRN -Flutter valve -Aggressive pulmonary toilet   Positive MRSA bacteremia - continue current course of antibiotics -2/20 S/P HD cath removed -TEE pending. Spoke with Trish from Cardiology and she will schedule patient for TEE on 2/22   Sinus Tachycardia -Most likely secondary to bacteremia/pneumonia.  -Asymptomatic -TSH normal   Chronic paroxysmal atrial fibrillation -Currently in NSR -Coumadin per pharmacy Recent Labs  Lab 05/21/17 1750 05/22/17 0303 05/23/17 0349 05/25/17 0421  INR 4.43* 5.30* 4.39* 2.05  -2/20 supratherapeutic. Hold Coumadin  -2/21 vitamin K IV 1 mg. Nephrology to place a tunneled HD cath on 2/22 in the AM and would like INR< 1.8  Chronic Hypotension  -Midodrin 10 mg with HD    ESRD on  HD M/W/F  -HD per nephrology   Hypothyroidism -Synthroid 100 g daily  Diabetes type 2 controlled with complication -09/10/6293 Hemoglobin A1c= 5.4 -2/21 Hemoglobin A1c= 4.5 -Lipid panel within ADA guidelines    DVT prophylaxis: Coumadin Code Status: DO NOT RESUSCITATE Family Communication: Family at bedside for discussion  of plan of care Disposition Plan: TBD   Consultants:  Nephrology ID   Procedures/Significant Events:     I have personally reviewed and interpreted all radiology studies and my findings are as above.  VENTILATOR SETTINGS:    Cultures 2/17 Blood Positive MRSA  2/17 Sputum Positive MRSA    Antimicrobials: Anti-infectives (From admission, onward)   Start     Stop   05/24/17 1200  vancomycin (VANCOCIN) IVPB 1000 mg/200 mL premix         05/22/17 1800  vancomycin (VANCOCIN) IVPB 1000 mg/200 mL premix     05/23/17 1315   05/22/17 1604  vancomycin (VANCOCIN) 1-5 GM/200ML-% IVPB    Comments:  Ashley Akin   : cabinet override   05/23/17 0414   05/22/17 0800  aztreonam (AZACTAM) 500 mg in dextrose 5 % 50 mL IVPB  Status:  Discontinued     05/22/17 1710   05/21/17 2100  vancomycin (VANCOCIN) IVPB 1000 mg/200 mL premix     05/21/17 2243   05/21/17 1845  aztreonam (AZACTAM) 2 g in sodium chloride 0.9 % 100 mL IVPB     05/21/17 2032   05/21/17 1845  vancomycin (VANCOCIN) IVPB 1000 mg/200 mL premix     05/21/17 2122       Devices    LINES / TUBES:  Tunneled HD cath??>> 2/20    Continuous Infusions: . vancomycin Stopped (05/24/17 1300)     Objective: Vitals:   05/25/17 0448 05/25/17 0742 05/25/17 0825 05/25/17 0829  BP: (!) 141/80 (!) 135/91    Pulse: (!) 123 (!) 120    Resp: 15  15    Temp: 98.8 F (37.1 C) 98.8 F (37.1 C)    TempSrc: Oral Oral    SpO2: 93% 93% 95% 93%  Weight: 195 lb 12.8 oz (88.8 kg)     Height:        Intake/Output Summary (Last 24 hours) at 05/25/2017 0834 Last data filed at 05/24/2017 2200 Gross per 24 hour  Intake 1020 ml  Output 758 ml  Net 262 ml   Filed Weights   05/24/17 0730 05/24/17 1152 05/25/17 0448  Weight: 197 lb 1.5 oz (89.4 kg) 195 lb 12.3 oz (88.8 kg) 195 lb 12.8 oz (88.8 kg)    Physical Exam:  General: A/O 4, positive acute respiratory distress Neck:  Negative scars, masses, torticollis, lymphadenopathy,  JVD Lungs: positive diffuse rhonchi (improved), negative crackles, negative expiratory wheeze.  Cardiovascular: Regular rate and rhythm without murmur gallop or rub normal S1 and S2 Abdomen: negative abdominal pain, nondistended, positive soft, bowel sounds, no rebound, no ascites, no appreciable mass Extremities: No significant cyanosis, clubbing, or edema bilateral lower extremities Skin: Negative rashes, lesions, ulcers Psychiatric:  Negative depression, negative anxiety, negative fatigue, negative mania  Central nervous system:  Cranial nerves II through XII intact, tongue/uvula midline, all extremities muscle strength 5/5, sensation intact throughout,  negative dysarthria, negative expressive aphasia, negative receptive aphasia.  .     Data Reviewed: Care during the described time interval was provided by me .  I have reviewed this patient's available data, including medical history, events of note, physical examination, and all test results as part of my evaluation.   CBC: Recent Labs  Lab 05/21/17 1750 05/22/17 0303 05/24/17 0830 05/25/17 0421  WBC 19.6* 17.0* 19.3* 19.7*  NEUTROABS 17.3*  --   --   --   HGB 13.6 12.7 11.8* 11.7*  HCT 42.6 39.2 36.5 35.3*  MCV 98.6 97.3 97.1 95.9  PLT 216 198 248 427   Basic Metabolic Panel: Recent Labs  Lab 05/21/17 1750 05/22/17 0303 05/24/17 0830 05/25/17 0421  NA 138 135 140 139  K 4.0 3.8 4.2 4.1  CL 98* 97* 101 99*  CO2 22 20* 23 28  GLUCOSE 128* 163* 95 117*  BUN 54* 62* 53* 29*  CREATININE 7.80* 8.15* 7.16* 4.64*  CALCIUM 6.7* 6.3* 7.5* 7.7*  MG  --   --   --  1.9  PHOS  --   --  3.0  --    GFR: Estimated Creatinine Clearance: 11.2 mL/min (A) (by C-G formula based on SCr of 4.64 mg/dL (H)). Liver Function Tests: Recent Labs  Lab 05/21/17 1750 05/24/17 0830  AST 24  --   ALT 20  --   ALKPHOS 103  --   BILITOT 1.0  --   PROT 6.0*  --   ALBUMIN 3.1* 2.2*   No results for input(s): LIPASE, AMYLASE in the last  168 hours. No results for input(s): AMMONIA in the last 168 hours. Coagulation Profile: Recent Labs  Lab 05/21/17 1750 05/22/17 0303 05/23/17 0349 05/25/17 0421  INR 4.43* 5.30* 4.39* 2.05   Cardiac Enzymes: Recent Labs  Lab 05/21/17 2250 05/22/17 0303 05/22/17 1738  TROPONINI 0.03* 0.03* 0.26*   BNP (last 3 results) No results for input(s): PROBNP in the last 8760 hours. HbA1C: Recent Labs    05/25/17 0421  HGBA1C 4.5*   CBG: Recent Labs  Lab 05/23/17 2116 05/24/17 1222 05/24/17 1655 05/24/17 2130 05/25/17 0744  GLUCAP 115* 87 166* 134* 101*   Lipid Profile: Recent  Labs    05/25/17 0421  CHOL 107  HDL 32*  LDLCALC 57  TRIG 88  CHOLHDL 3.3   Thyroid Function Tests: No results for input(s): TSH, T4TOTAL, FREET4, T3FREE, THYROIDAB in the last 72 hours. Anemia Panel: No results for input(s): VITAMINB12, FOLATE, FERRITIN, TIBC, IRON, RETICCTPCT in the last 72 hours. Urine analysis:    Component Value Date/Time   COLORURINE YELLOW 07/26/2013 1202   APPEARANCEUR CLEAR 07/26/2013 1202   LABSPEC 1.016 07/26/2013 1202   PHURINE 7.0 07/26/2013 1202   GLUCOSEU NEGATIVE 07/26/2013 1202   HGBUR SMALL (A) 07/26/2013 1202   BILIRUBINUR NEGATIVE 07/26/2013 1202   KETONESUR NEGATIVE 07/26/2013 1202   PROTEINUR 100 (A) 07/26/2013 1202   UROBILINOGEN 1.0 07/26/2013 1202   NITRITE NEGATIVE 07/26/2013 1202   LEUKOCYTESUR SMALL (A) 07/26/2013 1202   Sepsis Labs: @LABRCNTIP (procalcitonin:4,lacticidven:4)  ) Recent Results (from the past 240 hour(s))  Blood Culture (routine x 2)     Status: Abnormal   Collection Time: 05/21/17  6:41 PM  Result Value Ref Range Status   Specimen Description BLOOD RIGHT FOREARM  Final   Special Requests   Final    BOTTLES DRAWN AEROBIC AND ANAEROBIC Blood Culture adequate volume   Culture  Setup Time   Final    GRAM POSITIVE COCCI ANAEROBIC BOTTLE ONLY CRITICAL RESULT CALLED TO, READ BACK BY AND VERIFIED WITHEzekiel Slocumb PHARMD  1508 05/22/17 A BROWNING Performed at [facility], [place] [address] [place], [place] [address]    Culture METHICILLIN RESISTANT STAPHYLOCOCCUS AUREUS (A)  Final   Report Status 05/24/2017 FINAL  Final   Organism ID, Bacteria METHICILLIN RESISTANT STAPHYLOCOCCUS AUREUS  Final      Susceptibility   Methicillin resistant staphylococcus aureus - MIC*    CIPROFLOXACIN >=8 RESISTANT Resistant     ERYTHROMYCIN >=8 RESISTANT Resistant     GENTAMICIN <=0.5 SENSITIVE Sensitive     OXACILLIN >=4 RESISTANT Resistant     TETRACYCLINE <=1 SENSITIVE Sensitive     VANCOMYCIN <=0.5 SENSITIVE Sensitive     TRIMETH/SULFA <=10 SENSITIVE Sensitive     CLINDAMYCIN <=0.25 SENSITIVE Sensitive     RIFAMPIN <=0.5 SENSITIVE Sensitive     Inducible Clindamycin NEGATIVE Sensitive     * METHICILLIN RESISTANT STAPHYLOCOCCUS AUREUS  Blood Culture ID Panel (Reflexed)     Status: Abnormal   Collection Time: 05/21/17  6:41 PM  Result Value Ref Range Status   Enterococcus species NOT DETECTED NOT DETECTED Final   Listeria monocytogenes NOT DETECTED NOT DETECTED Final   Staphylococcus species DETECTED (A) NOT DETECTED Final    Comment: CRITICAL RESULT CALLED TO, READ BACK BY AND VERIFIED WITH: E SINCLAIR PHARMD 1508 05/22/17 A BROWNING    Staphylococcus aureus DETECTED (A) NOT DETECTED Final    Comment: Methicillin (oxacillin)-resistant Staphylococcus aureus (MRSA). MRSA is predictably resistant to beta-lactam antibiotics (except ceftaroline). Preferred therapy is vancomycin unless clinically contraindicated. Patient requires contact precautions if  hospitalized. CRITICAL RESULT CALLED TO, READ BACK BY AND VERIFIED WITH: E SINCLAIR PHARMD 1508 05/22/17 A BROWNING    Methicillin resistance DETECTED (A) NOT DETECTED Final    Comment: CRITICAL RESULT CALLED TO, READ BACK BY AND VERIFIED WITH: E SINCLAIR PHARMD 1508 05/22/17 A BROWNING    Streptococcus species NOT DETECTED NOT DETECTED Final   Streptococcus  agalactiae NOT DETECTED NOT DETECTED Final   Streptococcus pneumoniae NOT DETECTED NOT DETECTED Final   Streptococcus pyogenes NOT DETECTED NOT DETECTED Final   Acinetobacter baumannii NOT DETECTED NOT DETECTED Final  Enterobacteriaceae species NOT DETECTED NOT DETECTED Final   Enterobacter cloacae complex NOT DETECTED NOT DETECTED Final   Escherichia coli NOT DETECTED NOT DETECTED Final   Klebsiella oxytoca NOT DETECTED NOT DETECTED Final   Klebsiella pneumoniae NOT DETECTED NOT DETECTED Final   Proteus species NOT DETECTED NOT DETECTED Final   Serratia marcescens NOT DETECTED NOT DETECTED Final   Haemophilus influenzae NOT DETECTED NOT DETECTED Final   Neisseria meningitidis NOT DETECTED NOT DETECTED Final   Pseudomonas aeruginosa NOT DETECTED NOT DETECTED Final   Candida albicans NOT DETECTED NOT DETECTED Final   Candida glabrata NOT DETECTED NOT DETECTED Final   Candida krusei NOT DETECTED NOT DETECTED Final   Candida parapsilosis NOT DETECTED NOT DETECTED Final   Candida tropicalis NOT DETECTED NOT DETECTED Final    Comment: Performed at [facility], [place] [address], [place], [place] [address]  Blood Culture (routine x 2)     Status: None (Preliminary result)   Collection Time: 05/21/17  6:46 PM  Result Value Ref Range Status   Specimen Description BLOOD LEFT ANTECUBITAL  Final   Special Requests   Final    BOTTLES DRAWN AEROBIC AND ANAEROBIC Blood Culture adequate volume   Culture   Final    NO GROWTH 3 DAYS Performed at [facility], [place] [address], [place], [place] [address]    Report Status PENDING  Incomplete  Culture, expectorated sputum-assessment     Status: None   Collection Time: 05/21/17  7:03 PM  Result Value Ref Range Status   Specimen Description SPUTUM  Final   Special Requests NONE  Final   Sputum evaluation   Final    THIS SPECIMEN IS ACCEPTABLE FOR SPUTUM CULTURE Performed at [facility], [address] [place], [place] [address]     Report Status 05/22/2017 FINAL  Final  Culture, respiratory (NON-Expectorated)     Status: None   Collection Time: 05/21/17  7:03 PM  Result Value Ref Range Status   Specimen Description SPUTUM  Final   Special Requests NONE Reflexed from [phone]  Final   Gram Stain   Final    FEW WBC PRESENT, PREDOMINANTLY PMN ABUNDANT GRAM POSITIVE COCCI IN CLUSTERS MODERATE GRAM NEGATIVE RODS FEW YEAST Performed at [facility], [address], [place],  [address]    Culture   Final    ABUNDANT METHICILLIN RESISTANT STAPHYLOCOCCUS AUREUS   Report Status 05/24/2017 FINAL  Final   Organism ID, Bacteria METHICILLIN RESISTANT STAPHYLOCOCCUS AUREUS  Final      Susceptibility   Methicillin resistant staphylococcus aureus - MIC*    CIPROFLOXACIN >=8 RESISTANT Resistant     ERYTHROMYCIN >=8 RESISTANT Resistant     GENTAMICIN <=0.5 SENSITIVE Sensitive     OXACILLIN >=4 RESISTANT Resistant     TETRACYCLINE <=1 SENSITIVE Sensitive     VANCOMYCIN <=0.5 SENSITIVE Sensitive     TRIMETH/SULFA <=10 SENSITIVE Sensitive     CLINDAMYCIN <=0.25 SENSITIVE Sensitive     RIFAMPIN <=0.5 SENSITIVE Sensitive     Inducible Clindamycin NEGATIVE Sensitive     * ABUNDANT METHICILLIN RESISTANT STAPHYLOCOCCUS AUREUS  MRSA PCR Screening     Status: Abnormal   Collection Time: 05/24/17  7:28 PM  Result Value Ref Range Status   MRSA by PCR POSITIVE (A) NEGATIVE Final    Comment:        The GeneXpert MRSA Assay (FDA approved for NASAL specimens only), is one component of a comprehensive MRSA colonization surveillance  program. It is not intended to diagnose MRSA infection nor to guide or monitor treatment for MRSA infections. RESULT CALLED TO, READ BACK BY AND VERIFIED WITH: D.RODGERS AT 0157 05/25/17 BY L.PITT          Radiology Studies: Ir Removal Tun Cv Cath W/o Fl  Result Date: 05/24/2017 INDICATION: History of MRSA bacteremia. Patient has a 36-year-old tunneled hemodialysis catheter. Unsure of  where was placed or exactly when it was placed. Due to her bacteremia, a request has been made for removal of her HD catheter. EXAM: REMOVAL TUNNELED CENTRAL VENOUS CATHETER MEDICATIONS: 1% lidocaine with epinephrine; ANESTHESIA/SEDATION: None FLUOROSCOPY TIME:  Fluoroscopy Time: 0 minutes 0 seconds (0 mGy). COMPLICATIONS: None immediate. PROCEDURE: Informed written consent was obtained from the patient after a thorough discussion of the procedural risks, benefits and alternatives. All questions were addressed. Maximal Sterile Barrier Technique was utilized including caps, mask, sterile gowns, sterile gloves, sterile drape, hand hygiene and skin antiseptic. A timeout was performed prior to the initiation of the procedure. The patient's right chest and catheter was prepped and draped in a normal sterile fashion. Heparin was removed from both ports of catheter. 1% lidocaine with epinephrine was used for local anesthesia. Using gentle blunt and sharp dissection the cuff of the catheter was exposed and the catheter was removed in it's entirety. Pressure was held till hemostasis was obtained. A sterile dressing was applied. The patient tolerated the procedure well with no immediate complications. IMPRESSION: Successful catheter removal as described above. Read by: Saverio Danker, PA-C Electronically Signed   By: Markus Daft M.D.   On: 05/24/2017 15:24        Scheduled Meds: . budesonide (PULMICORT) nebulizer solution  0.25 mg Nebulization BID  . Chlorhexidine Gluconate Cloth  6 each Topical Q0600  . cinacalcet  30 mg Oral QODAY  . citalopram  10 mg Oral BID  . doxercalciferol  1 mcg Intravenous Q M,W,F-HD  . ezetimibe  10 mg Oral Daily  . feeding supplement (NEPRO CARB STEADY)  237 mL Oral BID BM  . ferric citrate  420 mg Oral TID AC  . gabapentin  100 mg Oral QHS  . insulin aspart  0-9 Units Subcutaneous TID WC  . ipratropium  0.5 mg Nebulization TID  . levalbuterol  0.63 mg Nebulization TID  .  levothyroxine  100 mcg Oral QAC breakfast  . midodrine  10 mg Oral Q M,W,F-HD  . multivitamin  1 tablet Oral Daily  . mupirocin ointment  1 application Nasal BID  . pantoprazole  40 mg Oral Daily   Continuous Infusions: . vancomycin Stopped (05/24/17 1300)     LOS: 4 days    Time spent: 40 minutes    Sonjia Wilcoxson, Geraldo Docker, MD [facility] Pager [phone]   If 7PM-7AM, please contact night-coverage [email or web address] Password [facility] 05/25/2017, 8:34 AM

## 2017-05-25 NOTE — Progress Notes (Addendum)
ANTICOAGULATION CONSULT NOTE - Initial Consult  Pharmacy Consult for Warfarin Indication: chest pain/ACS and atrial fibrillation  Allergies  Allergen Reactions  . Amoxicillin Rash    Has patient had a PCN reaction causing immediate rash, facial/tongue/throat swelling, SOB or lightheadedness with hypotension:YES Has patient had a PCN reaction causing severe rash involving mucus membranes or skin necrosis: Yes Has patient had a PCN reaction that required hospitalization No Has patient had a PCN reaction occurring within the last 10 years: Yes If all of the above answers are "NO", then may proceed with Cephalosporin use.   . Sulfa Drugs Cross Reactors Hives and Itching  . Percocet [Oxycodone-Acetaminophen] Itching and Rash    Did not happen last time she took it  . Sulfa Antibiotics Hives    Patient Measurements: Height: _0  (165.1 cm) Weight: 195 lb 12.8 oz (88.8 kg) IBW/kg (Calculated) : 57  Vital Signs: Temp: 98.8 F (37.1 C) (02/21 0742) Temp Source: Oral (02/21 0742) BP: 135/91 (02/21 0742) Pulse Rate: 120 (02/21 0742)  Labs: Recent Labs    05/22/17 1738 05/23/17 0349 05/24/17 0830 05/25/17 0421  HGB  --   --  11.8* 11.7*  HCT  --   --  36.5 35.3*  PLT  --   --  248 237  LABPROT  --  41.6*  --  23.0*  INR  --  4.39*  --  2.05  CREATININE  --   --  7.16* 4.64*  TROPONINI 0.26*  --   --   --     Estimated Creatinine Clearance: 11.2 mL/min (A) (by C-G formula based on SCr of 4.64 mg/dL (H)).   Medical History: Past Medical History:  Diagnosis Date  . A-fib ([place])   . Acute respiratory failure ([place]) 05/2017  . Anemia   . Arthritis   . Diabetes mellitus    "diet controlled"  . Diabetes mellitus without complication ([place])   . ESRD (end stage renal disease) ([place])    dialysis MWF  . GERD (gastroesophageal reflux disease)   . Gout   . History of blood transfusion   . HLD (hyperlipidemia)   . HOH (hard of hearing)    left ear  . Hypertension    hypotensive -since starting dialysis  . Hypothyroidism   . PAF (paroxysmal atrial fibrillation) ([place])    a. Echo 11/16:  Mild LVH, EF 55-60%, normal wall motion, MAC, mild MR, severe LAE (49 ml/m2), mild RVE, normal RVSF, mild RAE, mild TR, PASP 24 mmHg;  CHADS2-VASc: 4 >> Coumadin followed by PCP  . Renal disorder     Assessment: 77 year old female on warfarin prior to admission for Afib INR in therapeutic range, but on hold pending hemodialysis access placement  Goal of Therapy:  INR 2-3 Monitor platelets by anticoagulation protocol: Yes   Plan:  Hold warfarin Daily INR  Thank you Anette Guarneri, PharmD [phone] 05/25/2017,9:06 AM

## 2017-05-26 ENCOUNTER — Encounter (HOSPITAL_COMMUNITY): Payer: Self-pay | Admitting: Cardiology

## 2017-05-26 ENCOUNTER — Inpatient Hospital Stay (HOSPITAL_COMMUNITY): Payer: Medicare Other | Admitting: Certified Registered Nurse Anesthetist

## 2017-05-26 ENCOUNTER — Inpatient Hospital Stay (HOSPITAL_COMMUNITY): Payer: Medicare Other

## 2017-05-26 ENCOUNTER — Encounter (HOSPITAL_COMMUNITY)
Admission: EM | Disposition: A | Discharge status: Home Health | Payer: Self-pay | Source: Home / Self Care | Attending: Internal Medicine

## 2017-05-26 DIAGNOSIS — R000 Tachycardia, unspecified: Secondary | ICD-10-CM

## 2017-05-26 DIAGNOSIS — R7881 Bacteremia: Secondary | ICD-10-CM

## 2017-05-26 HISTORY — PX: IR FLUORO GUIDE CV LINE RIGHT: IMG2283

## 2017-05-26 HISTORY — PX: IR US GUIDE VASC ACCESS RIGHT: IMG2390

## 2017-05-26 HISTORY — PX: TEE WITHOUT CARDIOVERSION: SHX5443

## 2017-05-26 LAB — CBC
HCT: 34.3 % — ABNORMAL LOW (ref 36.0–46.0)
Hemoglobin: 11.1 g/dL — ABNORMAL LOW (ref 12.0–15.0)
MCH: 31.3 pg (ref 26.0–34.0)
MCHC: 32.4 g/dL (ref 30.0–36.0)
MCV: 96.6 fL (ref 78.0–100.0)
Platelets: 253 10*3/uL (ref 150–400)
RBC: 3.55 MIL/uL — ABNORMAL LOW (ref 3.87–5.11)
RDW: 16.9 % — ABNORMAL HIGH (ref 11.5–15.5)
WBC: 17.2 10*3/uL — ABNORMAL HIGH (ref 4.0–10.5)

## 2017-05-26 LAB — BASIC METABOLIC PANEL
Anion gap: 15 (ref 5–15)
BUN: 50 mg/dL — ABNORMAL HIGH (ref 6–20)
CO2: 25 mmol/L (ref 22–32)
Calcium: 7.7 mg/dL — ABNORMAL LOW (ref 8.9–10.3)
Chloride: 100 mmol/L — ABNORMAL LOW (ref 101–111)
Creatinine, Ser: 6.38 mg/dL — ABNORMAL HIGH (ref 0.44–1.00)
GFR calc Af Amer: 7 mL/min — ABNORMAL LOW (ref >60)
GFR calc non Af Amer: 6 mL/min — ABNORMAL LOW (ref >60)
Glucose, Bld: 108 mg/dL — ABNORMAL HIGH (ref 65–99)
Potassium: 4 mmol/L (ref 3.5–5.1)
Sodium: 140 mmol/L (ref 135–145)

## 2017-05-26 LAB — GLUCOSE, CAPILLARY
Glucose-Capillary: 113 mg/dL — ABNORMAL HIGH (ref 65–99)
Glucose-Capillary: 93 mg/dL (ref 65–99)
Glucose-Capillary: 165 mg/dL — ABNORMAL HIGH (ref 65–99)
Glucose-Capillary: 125 mg/dL — ABNORMAL HIGH (ref 65–99)

## 2017-05-26 LAB — PROTIME-INR
INR: 1.35
Prothrombin Time: 16.5 seconds — ABNORMAL HIGH (ref 11.4–15.2)

## 2017-05-26 LAB — CULTURE, BLOOD (ROUTINE X 2)
Culture: NO GROWTH
Special Requests: ADEQUATE

## 2017-05-26 LAB — MAGNESIUM: Magnesium: 1.8 mg/dL (ref 1.7–2.4)

## 2017-05-26 SURGERY — ECHOCARDIOGRAM, TRANSESOPHAGEAL
Anesthesia: Monitor Anesthesia Care

## 2017-05-26 MED ORDER — BUTAMBEN-TETRACAINE-BENZOCAINE 2-2-14 % EX AERO
INHALATION_SPRAY | CUTANEOUS | Status: DC | PRN
  Administered 2017-05-26: 2 via TOPICAL

## 2017-05-26 MED ORDER — POLYETHYLENE GLYCOL 3350 17 G PO PACK
17.0000 g | PACK | Freq: Every day | ORAL | Status: DC
  Administered 2017-05-27 – 2017-05-28 (×3): 17 g via ORAL
  Filled 2017-05-26 (×3): qty 1

## 2017-05-26 MED ORDER — MIDAZOLAM HCL 5 MG/ML IJ SOLN
INTRAMUSCULAR | Status: AC
  Filled 2017-05-26: qty 2

## 2017-05-26 MED ORDER — HEPARIN SODIUM (PORCINE) 1000 UNIT/ML IJ SOLN
INTRAMUSCULAR | Status: AC
  Administered 2017-05-26: 3.8 mL
  Filled 2017-05-26: qty 1

## 2017-05-26 MED ORDER — CEFAZOLIN SODIUM-DEXTROSE 2-4 GM/100ML-% IV SOLN
INTRAVENOUS | Status: AC
  Administered 2017-05-26: 2 g
  Filled 2017-05-26: qty 100

## 2017-05-26 MED ORDER — PROPOFOL 10 MG/ML IV BOLUS
INTRAVENOUS | Status: DC | PRN
  Administered 2017-05-26: 50 mg via INTRAVENOUS

## 2017-05-26 MED ORDER — LACTATED RINGERS IV SOLN
INTRAVENOUS | Status: DC | PRN
  Administered 2017-05-26: 09:00:00 via INTRAVENOUS

## 2017-05-26 MED ORDER — MIDAZOLAM HCL 2 MG/2ML IJ SOLN
INTRAMUSCULAR | Status: AC | PRN
  Administered 2017-05-26: 1 mg via INTRAVENOUS

## 2017-05-26 MED ORDER — LIDOCAINE HCL 1 % IJ SOLN
INTRAMUSCULAR | Status: AC
  Filled 2017-05-26: qty 20

## 2017-05-26 MED ORDER — FENTANYL CITRATE (PF) 100 MCG/2ML IJ SOLN
INTRAMUSCULAR | Status: AC | PRN
  Administered 2017-05-26: 50 ug via INTRAVENOUS

## 2017-05-26 MED ORDER — MIDAZOLAM HCL 2 MG/2ML IJ SOLN
INTRAMUSCULAR | Status: AC
  Filled 2017-05-26: qty 4

## 2017-05-26 MED ORDER — LIDOCAINE HCL (PF) 1 % IJ SOLN
INTRAMUSCULAR | Status: AC | PRN
  Administered 2017-05-26: 10 mL

## 2017-05-26 MED ORDER — WARFARIN SODIUM 10 MG PO TABS
10.0000 mg | ORAL_TABLET | Freq: Once | ORAL | Status: AC
  Administered 2017-05-26: 10 mg via ORAL
  Filled 2017-05-26: qty 1

## 2017-05-26 MED ORDER — FENTANYL CITRATE (PF) 100 MCG/2ML IJ SOLN
INTRAMUSCULAR | Status: AC
  Filled 2017-05-26: qty 4

## 2017-05-26 MED ORDER — WARFARIN - PHARMACIST DOSING INPATIENT
Freq: Every day | Status: DC
  Administered 2017-05-26: 19:00:00

## 2017-05-26 MED ORDER — PROPOFOL 500 MG/50ML IV EMUL
INTRAVENOUS | Status: DC | PRN
  Administered 2017-05-26: 80 ug/kg/min via INTRAVENOUS

## 2017-05-26 MED ORDER — FENTANYL CITRATE (PF) 100 MCG/2ML IJ SOLN
INTRAMUSCULAR | Status: AC
  Filled 2017-05-26: qty 2

## 2017-05-26 NOTE — Plan of Care (Signed)
  Progressing Pain Managment: General experience of comfort will improve 05/26/2017 0506 - Progressing by Colonel Bald, RN Note Denies c/o pain or discomfort.

## 2017-05-26 NOTE — Transfer of Care (Signed)
Immediate Anesthesia Transfer of Care Note  Patient: [name]  Procedure(s) Performed: TRANSESOPHAGEAL ECHOCARDIOGRAM (TEE) (N/A )  Patient Location: Endoscopy Unit  Anesthesia Type:MAC  Level of Consciousness: drowsy and patient cooperative  Airway & Oxygen Therapy: Patient Spontanous Breathing and Patient connected to nasal cannula oxygen  Post-op Assessment: Report given to RN, Post -op Vital signs reviewed and stable and Patient moving all extremities  Post vital signs: Reviewed and stable  Last Vitals:  Vitals:   05/26/17 0835 05/26/17 0840  BP:  (!) 136/96  Pulse:  (!) 117  Resp:  20  Temp:    SpO2: 95% 95%    Last Pain:  Vitals:   05/26/17 0829  TempSrc: Oral  PainSc:       Patients Stated Pain Goal: 0 (73/71/06 2694)  Complications: No apparent anesthesia complications

## 2017-05-26 NOTE — Sedation Documentation (Signed)
Patient is resting comfortably. 

## 2017-05-26 NOTE — Progress Notes (Signed)
ANTICOAGULATION CONSULT NOTE  Pharmacy Consult for Warfarin Indication: chest pain/ACS and atrial fibrillation  Allergies  Allergen Reactions  . Amoxicillin Rash    Has patient had a PCN reaction causing immediate rash, facial/tongue/throat swelling, SOB or lightheadedness with hypotension:YES Has patient had a PCN reaction causing severe rash involving mucus membranes or skin necrosis: Yes Has patient had a PCN reaction that required hospitalization No Has patient had a PCN reaction occurring within the last 10 years: Yes If all of the above answers are "NO", then may proceed with Cephalosporin use.   . Sulfa Drugs Cross Reactors Hives and Itching  . Percocet [Oxycodone-Acetaminophen] Itching and Rash    Did not happen last time she took it  . Sulfa Antibiotics Hives   Labs: Recent Labs    05/24/17 0830 05/25/17 0421 05/26/17 0452  HGB 11.8* 11.7* 11.1*  HCT 36.5 35.3* 34.3*  PLT 248 237 253  LABPROT  --  23.0* 16.5*  INR  --  2.05 1.35  CREATININE 7.16* 4.64* 6.38*    Estimated Creatinine Clearance: 8.1 mL/min (A) (by C-G formula based on SCr of 6.38 mg/dL (H)).  Assessment: 77 year old female on warfarin prior to admission for Afib. Warfarin was held for HD access placement- now completed and ok to resume warfarin. Patient received vitamin K 1mg  yesterday. INR this morning 1.35.  Home dose 5mg  daily except 7.5mg  on Wednesdays.   Goal of Therapy:  INR 2-3 Monitor platelets by anticoagulation protocol: Yes   Plan:  Warfarin 10mg  po x1 tonight Daily INR Follow s/s bleeding  Sakara Lehtinen D. Marquail Bradwell, PharmD, [place] Clinical Pharmacist [phone] 05/26/2017 6:12 PM

## 2017-05-26 NOTE — Procedures (Signed)
Interventional Radiology Procedure Note  Procedure: Placement of a LEFT IJ 23 cm Palindrome tunneled HD catheter.  Tips in the RA and ready for use.   Complications: None  Estimated Blood Loss: None  Recommendations: - Routine line care  Signed,  Criselda Peaches, MD

## 2017-05-26 NOTE — CV Procedure (Signed)
    Transesophageal Echocardiogram Note  [name] [id number] May 05, 1940  Procedure: Transesophageal Echocardiogram Indications: Bacteremia  Procedure Details Consent: Obtained Time Out: Verified patient identification, verified procedure, site/side was marked, verified correct patient position, special equipment/implants available, Radiology Safety Procedures followed,  medications/allergies/relevent history reviewed, required imaging and test results available.  Performed  Medications:  Pt sedated by anesthesia with 135 mg diprovan IV total during the procedure.  Normal LV function; moderate biatrial enlargement; no LAA thrombus; atrial septal aneurysm; trace MR; mild TR; no vegetations noted.   Complications: No apparent complications Patient did tolerate procedure well.  Kirk Ruths, MD

## 2017-05-26 NOTE — Anesthesia Postprocedure Evaluation (Signed)
Anesthesia Post Note  Patient: [name]  Procedure(s) Performed: TRANSESOPHAGEAL ECHOCARDIOGRAM (TEE) (N/A )     Patient location during evaluation: PACU Anesthesia Type: General Level of consciousness: awake Pain management: pain level controlled Vital Signs Assessment: post-procedure vital signs reviewed and stable Respiratory status: spontaneous breathing Cardiovascular status: stable Anesthetic complications: no    Last Vitals:  Vitals:   05/26/17 1340 05/26/17 1352  BP:    Pulse:  (!) 124  Resp:  (!) 25  Temp:    SpO2: 94% 95%    Last Pain:  Vitals:   05/26/17 1120  TempSrc: Oral  PainSc:                  Octavian Godek

## 2017-05-26 NOTE — Progress Notes (Signed)
PROGRESS NOTE    [name]  BSW:[id number] DOB: 28-Nov-1940 DOA: 05/21/2017 PCP: Dione Housekeeper, MD   Brief Narrative:  [name] PMHx ESRD on HD M/W/F Paroxysmal  A-Fib , HTN, Hypothyroidism, and DM Type 2    Brought to the ER w/ persistent shortness of breath and right-sided chest pain.  She was started on antibiotics and prednisone on February 11 by her PCP, and also treated w/ Tamiflu     Subjective: 2/22 A/O 4, positive SOB, negative abdominal pain, negative N/V. Positive productive cough (yellow).     Assessment & Plan:   Principal Problem:   Acute respiratory failure with hypoxia (HCC) Active Problems:   Hypothyroidism   Anemia   Atrial fibrillation (HCC)   MRSA (methicillin resistant staph aureus) culture positive  Acute Respiratory Failure with Hypoxia/positive MRSA pneumonia -Continue current antibiotics -Budesonide nebulizer BID -Ipratropium nebulizer TID -Xopenex nebulizer PRN -Flutter valve -Aggressive pulmonary toilet   Positive MRSA bacteremia - continue current course of antibiotics -2/20 S/P HD cath removed -2/22 S/P TEE; negative vegetation see results below   Sinus Tachycardia -Most likely secondary to bacteremia/pneumonia.  -Asymptomatic -TSH normal   Chronic paroxysmal atrial fibrillation -Currently in NSR -Coumadin per pharmacy Recent Labs  Lab 05/21/17 1750 05/22/17 0303 05/23/17 0349 05/25/17 0421 05/26/17 0452  INR 4.43* 5.30* 4.39* 2.05 1.35  -2/20 supratherapeutic. Hold Coumadin  -2/21 vitamin K IV 1 mg. Nephrology to place a tunneled HD cath on 2/22 in the AM and would like INR< 1.8 -2/22 restart Coumadin per pharmacy  Chronic Hypotension  -Midodrin 10 mg with HD    ESRD on  HD M/W/F  -HD per nephrology -2/22 S/P placement of LEFT IJ tunneled HD cath    Hypothyroidism -Synthroid 100 g daily  Diabetes type 2 controlled with complication -07/08/6597 Hemoglobin A1c= 5.4 -2/21 Hemoglobin A1c= 4.5 -Lipid panel within  ADA guidelines    DVT prophylaxis: Coumadin Code Status: DO NOT RESUSCITATE Family Communication: Family at bedside for discussion of plan of care Disposition Plan: TBD   Consultants:  Nephrology ID   Procedures/Significant Events:     I have personally reviewed and interpreted all radiology studies and my findings are as above.  VENTILATOR SETTINGS:    Cultures 2/17 Blood Positive MRSA  2/17 Sputum Positive MRSA    Antimicrobials: Anti-infectives (From admission, onward)   Start     Stop   05/24/17 1200  vancomycin (VANCOCIN) IVPB 1000 mg/200 mL premix         05/22/17 1800  vancomycin (VANCOCIN) IVPB 1000 mg/200 mL premix     05/23/17 1315   05/22/17 1604  vancomycin (VANCOCIN) 1-5 GM/200ML-% IVPB    Comments:  Ashley Akin   : cabinet override   05/23/17 0414   05/22/17 0800  aztreonam (AZACTAM) 500 mg in dextrose 5 % 50 mL IVPB  Status:  Discontinued     05/22/17 1710   05/21/17 2100  vancomycin (VANCOCIN) IVPB 1000 mg/200 mL premix     05/21/17 2243   05/21/17 1845  aztreonam (AZACTAM) 2 g in sodium chloride 0.9 % 100 mL IVPB     05/21/17 2032   05/21/17 1845  vancomycin (VANCOCIN) IVPB 1000 mg/200 mL premix     05/21/17 2122       Devices    LINES / TUBES:  Tunneled HD cath ??>> 2/20 Palindrome LEFT IJ Tunneled HD Cath 2/22>>     Continuous Infusions: . sodium chloride    . sodium chloride    .  vancomycin Stopped (05/24/17 1300)     Objective: Vitals:   05/26/17 1315 05/26/17 1340 05/26/17 1352 05/26/17 1640  BP: 118/81   111/81  Pulse: (!) 121  (!) 124 (!) 121  Resp: 18  (!) 25 (!) 21  Temp:    98.5 F (36.9 C)  TempSrc:    Oral  SpO2: 97% 94% 95% 95%  Weight:      Height:        Intake/Output Summary (Last 24 hours) at 05/26/2017 1757 Last data filed at 05/26/2017 1600 Gross per 24 hour  Intake 780 ml  Output -  Net 780 ml   Filed Weights   05/25/17 0448 05/26/17 0414 05/26/17 0829  Weight: 195 lb 12.8 oz (88.8 kg)  196 lb 14.4 oz (89.3 kg) 196 lb (88.9 kg)    Physical Exam:  General: A/O 4, positive acute respiratory distress Neck:  Negative scars, masses, torticollis, lymphadenopathy, JVD Lungs: positive diffuse rhonchi, positive expiratory wheeze, negative crackles,   Cardiovascular: Regular rate and rhythm without murmur gallop or rub normal S1 and S2 Abdomen: negative abdominal pain, nondistended, positive soft, bowel sounds, no rebound, no ascites, no appreciable mass Extremities: No significant cyanosis, clubbing, or edema bilateral lower extremities Skin: Negative rashes, lesions, ulcers Psychiatric:  Negative depression, negative anxiety, negative fatigue, negative mania  Central nervous system:  Cranial nerves II through XII intact, tongue/uvula midline, all extremities muscle strength 5/5, sensation intact throughout,  negative dysarthria, negative expressive aphasia, negative receptive aphasia.  .     Data Reviewed: Care during the described time interval was provided by me .  I have reviewed this patient's available data, including medical history, events of note, physical examination, and all test results as part of my evaluation.   CBC: Recent Labs  Lab 05/21/17 1750 05/22/17 0303 05/24/17 0830 05/25/17 0421 05/26/17 0452  WBC 19.6* 17.0* 19.3* 19.7* 17.2*  NEUTROABS 17.3*  --   --   --   --   HGB 13.6 12.7 11.8* 11.7* 11.1*  HCT 42.6 39.2 36.5 35.3* 34.3*  MCV 98.6 97.3 97.1 95.9 96.6  PLT 216 198 248 237 917   Basic Metabolic Panel: Recent Labs  Lab 05/21/17 1750 05/22/17 0303 05/24/17 0830 05/25/17 0421 05/26/17 0452  NA 138 135 140 139 140  K 4.0 3.8 4.2 4.1 4.0  CL 98* 97* 101 99* 100*  CO2 22 20* '23 28 25  ' GLUCOSE 128* 163* 95 117* 108*  BUN 54* 62* 53* 29* 50*  CREATININE 7.80* 8.15* 7.16* 4.64* 6.38*  CALCIUM 6.7* 6.3* 7.5* 7.7* 7.7*  MG  --   --   --  1.9 1.8  PHOS  --   --  3.0  --   --    GFR: Estimated Creatinine Clearance: 8.1 mL/min (A) (by  C-G formula based on SCr of 6.38 mg/dL (H)). Liver Function Tests: Recent Labs  Lab 05/21/17 1750 05/24/17 0830  AST 24  --   ALT 20  --   ALKPHOS 103  --   BILITOT 1.0  --   PROT 6.0*  --   ALBUMIN 3.1* 2.2*   No results for input(s): LIPASE, AMYLASE in the last 168 hours. No results for input(s): AMMONIA in the last 168 hours. Coagulation Profile: Recent Labs  Lab 05/21/17 1750 05/22/17 0303 05/23/17 0349 05/25/17 0421 05/26/17 0452  INR 4.43* 5.30* 4.39* 2.05 1.35   Cardiac Enzymes: Recent Labs  Lab 05/21/17 2250 05/22/17 0303 05/22/17 1738  TROPONINI 0.03* 0.03* 0.26*  BNP (last 3 results) No results for input(s): PROBNP in the last 8760 hours. HbA1C: Recent Labs    05/25/17 0421  HGBA1C 4.5*   CBG: Recent Labs  Lab 05/25/17 1613 05/25/17 2137 05/26/17 0754 05/26/17 1123 05/26/17 1638  GLUCAP 147* 140* 113* 93 165*   Lipid Profile: Recent Labs    05/25/17 0421  CHOL 107  HDL 32*  LDLCALC 57  TRIG 88  CHOLHDL 3.3   Thyroid Function Tests: No results for input(s): TSH, T4TOTAL, FREET4, T3FREE, THYROIDAB in the last 72 hours. Anemia Panel: No results for input(s): VITAMINB12, FOLATE, FERRITIN, TIBC, IRON, RETICCTPCT in the last 72 hours. Urine analysis:    Component Value Date/Time   COLORURINE YELLOW 07/26/2013 1202   APPEARANCEUR CLEAR 07/26/2013 1202   LABSPEC 1.016 07/26/2013 1202   PHURINE 7.0 07/26/2013 1202   GLUCOSEU NEGATIVE 07/26/2013 1202   HGBUR SMALL (A) 07/26/2013 1202   BILIRUBINUR NEGATIVE 07/26/2013 1202   KETONESUR NEGATIVE 07/26/2013 1202   PROTEINUR 100 (A) 07/26/2013 1202   UROBILINOGEN 1.0 07/26/2013 1202   NITRITE NEGATIVE 07/26/2013 1202   LEUKOCYTESUR SMALL (A) 07/26/2013 1202   Sepsis Labs: '@LABRCNTIP' (procalcitonin:4,lacticidven:4)  ) Recent Results (from the past 240 hour(s))  Blood Culture (routine x 2)     Status: Abnormal   Collection Time: 05/21/17  6:41 PM  Result Value Ref Range Status    Specimen Description BLOOD RIGHT FOREARM  Final   Special Requests   Final    BOTTLES DRAWN AEROBIC AND ANAEROBIC Blood Culture adequate volume   Culture  Setup Time   Final    GRAM POSITIVE COCCI ANAEROBIC BOTTLE ONLY CRITICAL RESULT CALLED TO, READ BACK BY AND VERIFIED WITHEzekiel Slocumb PHARMD 1508 05/22/17 A BROWNING Performed at [facility], [place] [address], [place], [place] [address]    Culture METHICILLIN RESISTANT STAPHYLOCOCCUS AUREUS (A)  Final   Report Status 05/24/2017 FINAL  Final   Organism ID, Bacteria METHICILLIN RESISTANT STAPHYLOCOCCUS AUREUS  Final      Susceptibility   Methicillin resistant staphylococcus aureus - MIC*    CIPROFLOXACIN >=8 RESISTANT Resistant     ERYTHROMYCIN >=8 RESISTANT Resistant     GENTAMICIN <=0.5 SENSITIVE Sensitive     OXACILLIN >=4 RESISTANT Resistant     TETRACYCLINE <=1 SENSITIVE Sensitive     VANCOMYCIN <=0.5 SENSITIVE Sensitive     TRIMETH/SULFA <=10 SENSITIVE Sensitive     CLINDAMYCIN <=0.25 SENSITIVE Sensitive     RIFAMPIN <=0.5 SENSITIVE Sensitive     Inducible Clindamycin NEGATIVE Sensitive     * METHICILLIN RESISTANT STAPHYLOCOCCUS AUREUS  Blood Culture ID Panel (Reflexed)     Status: Abnormal   Collection Time: 05/21/17  6:41 PM  Result Value Ref Range Status   Enterococcus species NOT DETECTED NOT DETECTED Final   Listeria monocytogenes NOT DETECTED NOT DETECTED Final   Staphylococcus species DETECTED (A) NOT DETECTED Final    Comment: CRITICAL RESULT CALLED TO, READ BACK BY AND VERIFIED WITH: E SINCLAIR PHARMD 1508 05/22/17 A BROWNING    Staphylococcus aureus DETECTED (A) NOT DETECTED Final    Comment: Methicillin (oxacillin)-resistant Staphylococcus aureus (MRSA). MRSA is predictably resistant to beta-lactam antibiotics (except ceftaroline). Preferred therapy is vancomycin unless clinically contraindicated. Patient requires contact precautions if  hospitalized. CRITICAL RESULT CALLED TO, READ BACK BY AND VERIFIED  WITH: E SINCLAIR PHARMD 1508 05/22/17 A BROWNING    Methicillin resistance DETECTED (A) NOT DETECTED Final    Comment: CRITICAL RESULT CALLED TO, READ BACK BY AND VERIFIED  WITH: E SINCLAIR PHARMD 1508 05/22/17 A BROWNING    Streptococcus species NOT DETECTED NOT DETECTED Final   Streptococcus agalactiae NOT DETECTED NOT DETECTED Final   Streptococcus pneumoniae NOT DETECTED NOT DETECTED Final   Streptococcus pyogenes NOT DETECTED NOT DETECTED Final   Acinetobacter baumannii NOT DETECTED NOT DETECTED Final   Enterobacteriaceae species NOT DETECTED NOT DETECTED Final   Enterobacter cloacae complex NOT DETECTED NOT DETECTED Final   Escherichia coli NOT DETECTED NOT DETECTED Final   Klebsiella oxytoca NOT DETECTED NOT DETECTED Final   Klebsiella pneumoniae NOT DETECTED NOT DETECTED Final   Proteus species NOT DETECTED NOT DETECTED Final   Serratia marcescens NOT DETECTED NOT DETECTED Final   Haemophilus influenzae NOT DETECTED NOT DETECTED Final   Neisseria meningitidis NOT DETECTED NOT DETECTED Final   Pseudomonas aeruginosa NOT DETECTED NOT DETECTED Final   Candida albicans NOT DETECTED NOT DETECTED Final   Candida glabrata NOT DETECTED NOT DETECTED Final   Candida krusei NOT DETECTED NOT DETECTED Final   Candida parapsilosis NOT DETECTED NOT DETECTED Final   Candida tropicalis NOT DETECTED NOT DETECTED Final    Comment: Performed at [facility], [place] [address], [place], [place] [address]  Blood Culture (routine x 2)     Status: None   Collection Time: 05/21/17  6:46 PM  Result Value Ref Range Status   Specimen Description BLOOD LEFT ANTECUBITAL  Final   Special Requests   Final    BOTTLES DRAWN AEROBIC AND ANAEROBIC Blood Culture adequate volume   Culture   Final    NO GROWTH 5 DAYS Performed at [facility], [place] [address] [place], [place] [address]    Report Status 05/26/2017 FINAL  Final  Culture, expectorated sputum-assessment     Status: None   Collection Time:  05/21/17  7:03 PM  Result Value Ref Range Status   Specimen Description SPUTUM  Final   Special Requests NONE  Final   Sputum evaluation   Final    THIS SPECIMEN IS ACCEPTABLE FOR SPUTUM CULTURE Performed at [facility], [place] [address], [place], [place] [address]    Report Status 05/22/2017 FINAL  Final  Culture, respiratory (NON-Expectorated)     Status: None   Collection Time: 05/21/17  7:03 PM  Result Value Ref Range Status   Specimen Description SPUTUM  Final   Special Requests NONE Reflexed from [id number]  Final   Gram Stain   Final    FEW WBC PRESENT, PREDOMINANTLY PMN ABUNDANT GRAM POSITIVE COCCI IN CLUSTERS MODERATE GRAM NEGATIVE RODS FEW YEAST Performed at [facility], [address], [place], [place] [address]    Culture   Final    ABUNDANT METHICILLIN RESISTANT STAPHYLOCOCCUS AUREUS   Report Status 05/24/2017 FINAL  Final   Organism ID, Bacteria METHICILLIN RESISTANT STAPHYLOCOCCUS AUREUS  Final      Susceptibility   Methicillin resistant staphylococcus aureus - MIC*    CIPROFLOXACIN >=8 RESISTANT Resistant     ERYTHROMYCIN >=8 RESISTANT Resistant     GENTAMICIN <=0.5 SENSITIVE Sensitive     OXACILLIN >=4 RESISTANT Resistant     TETRACYCLINE <=1 SENSITIVE Sensitive     VANCOMYCIN <=0.5 SENSITIVE Sensitive     TRIMETH/SULFA <=10 SENSITIVE Sensitive     CLINDAMYCIN <=0.25 SENSITIVE Sensitive     RIFAMPIN <=0.5 SENSITIVE Sensitive     Inducible Clindamycin NEGATIVE Sensitive     * ABUNDANT METHICILLIN RESISTANT STAPHYLOCOCCUS AUREUS  MRSA PCR Screening  Status: Abnormal   Collection Time: 05/24/17  7:28 PM  Result Value Ref Range Status   MRSA by PCR POSITIVE (A) NEGATIVE Final    Comment:        The GeneXpert MRSA Assay (FDA approved for NASAL specimens only), is one component of a comprehensive MRSA colonization surveillance program. It is not intended to diagnose MRSA infection nor to guide or monitor treatment for MRSA infections. RESULT  CALLED TO, READ BACK BY AND VERIFIED WITH: D.RODGERS AT 0157 05/25/17 BY L.PITT   Culture, blood (routine x 2)     Status: None (Preliminary result)   Collection Time: 05/24/17  7:35 PM  Result Value Ref Range Status   Specimen Description BLOOD RIGHT ARM  Final   Special Requests   Final    BOTTLES DRAWN AEROBIC AND ANAEROBIC Blood Culture adequate volume   Culture   Final    NO GROWTH 2 DAYS Performed at [facility], [address] [place], [place] [address]    Report Status PENDING  Incomplete  Culture, blood (routine x 2)     Status: None (Preliminary result)   Collection Time: 05/24/17  7:40 PM  Result Value Ref Range Status   Specimen Description BLOOD LEFT ARM  Final   Special Requests   Final    BOTTLES DRAWN AEROBIC AND ANAEROBIC Blood Culture adequate volume   Culture   Final    NO GROWTH 2 DAYS Performed at [facility], [address], [place], [place] [address]    Report Status PENDING  Incomplete  Culture, blood (Routine X 2) w Reflex to ID Panel     Status: None (Preliminary result)   Collection Time: 05/25/17 10:05 AM  Result Value Ref Range Status   Specimen Description BLOOD LEFT HAND  Final   Special Requests   Final    Blood Culture adequate volume BOTTLES DRAWN AEROBIC AND ANAEROBIC   Culture   Final    NO GROWTH 1 DAY Performed at [facility], [place] [address] [place], [place] [address]    Report Status PENDING  Incomplete  Culture, blood (Routine X 2) w Reflex to ID Panel     Status: None (Preliminary result)   Collection Time: 05/25/17 10:15 AM  Result Value Ref Range Status   Specimen Description BLOOD LEFT HAND  Final   Special Requests   Final    Blood Culture adequate volume BOTTLES DRAWN AEROBIC AND ANAEROBIC   Culture   Final    NO GROWTH 1 DAY Performed at [facility], [place] [address], [place], [place] [address]    Report Status PENDING  Incomplete         Radiology Studies: Ir Fluoro Guide Cv Line  Right  Result Date: 05/26/2017 INDICATION: 77 year old female with end-stage renal disease on hemodialysis. She was recently admitted with influenza and MRSA pneumonia requiring removal of her existing right IJ tunneled hemodialysis catheter. She has now had a line holiday and is suitable for replacement. We will proceed with placement of a left IJ approach tunneled hemodialysis catheter. EXAM: TUNNELED CENTRAL VENOUS HEMODIALYSIS CATHETER PLACEMENT WITH ULTRASOUND AND FLUOROSCOPIC GUIDANCE MEDICATIONS: In patient currently on IV antibiotic coverage. No additional antibiotic prophylaxis administered. ANESTHESIA/SEDATION: Moderate (conscious) sedation was employed during this procedure. A total of Versed 1 mg and Fentanyl 50 mcg was administered intravenously. Moderate Sedation Time: 15 minutes. The patient's level of consciousness and vital signs were monitored continuously by radiology nursing throughout the  procedure under my direct supervision. FLUOROSCOPY TIME:  Fluoroscopy Time: 0 minutes 24 seconds (3 mGy). COMPLICATIONS: None immediate. PROCEDURE: Informed written consent was obtained from the patient after a discussion of the risks, benefits, and alternatives to treatment. Questions regarding the procedure were encouraged and answered. The left neck and chest were prepped with chlorhexidine in a sterile fashion, and a sterile drape was applied covering the operative field. Maximum barrier sterile technique with sterile gowns and gloves were used for the procedure. A timeout was performed prior to the initiation of the procedure. After creating a small venotomy incision, a micropuncture kit was utilized to access the left internal jugular vein under direct, real-time ultrasound guidance after the overlying soft tissues were anesthetized with 1% lidocaine with epinephrine. Ultrasound image documentation was performed. The microwire was kinked to measure appropriate catheter length. A stiff Glidewire was  advanced to the level of the IVC and the micropuncture sheath was exchanged for a peel-away sheath. A palindrome tunneled hemodialysis catheter measuring 23 cm from tip to cuff was tunneled in a retrograde fashion from the anterior chest wall to the venotomy incision. The catheter was then placed through the peel-away sheath with tips ultimately positioned within the superior aspect of the right atrium. Final catheter positioning was confirmed and documented with a spot radiographic image. The catheter aspirates and flushes normally. The catheter was flushed with appropriate volume heparin dwells. The catheter exit site was secured with a 0-Prolene retention suture. The venotomy incision was closed with Dermabond. Dressings were applied. The patient tolerated the procedure well without immediate post procedural complication. IMPRESSION: Successful placement of 23 cm tip to cuff tunneled hemodialysis catheter via the left internal jugular vein with tips terminating within the superior aspect of the right atrium. The catheter is ready for immediate use. Signed, Criselda Peaches, MD [facility] Radiology Electronically Signed   By: Jacqulynn Cadet M.D.   On: 05/26/2017 15:16   Ir US Guide Vasc Access Right  Result Date: 05/26/2017 INDICATION: 77 year old female with end-stage renal disease on hemodialysis. She was recently admitted with influenza and MRSA pneumonia requiring removal of her existing right IJ tunneled hemodialysis catheter. She has now had a line holiday and is suitable for replacement. We will proceed with placement of a left IJ approach tunneled hemodialysis catheter. EXAM: TUNNELED CENTRAL VENOUS HEMODIALYSIS CATHETER PLACEMENT WITH ULTRASOUND AND FLUOROSCOPIC GUIDANCE MEDICATIONS: In patient currently on IV antibiotic coverage. No additional antibiotic prophylaxis administered. ANESTHESIA/SEDATION: Moderate (conscious) sedation was employed  during this procedure. A total of Versed 1 mg and Fentanyl 50 mcg was administered intravenously. Moderate Sedation Time: 15 minutes. The patient's level of consciousness and vital signs were monitored continuously by radiology nursing throughout the procedure under my direct supervision. FLUOROSCOPY TIME:  Fluoroscopy Time: 0 minutes 24 seconds (3 mGy). COMPLICATIONS: None immediate. PROCEDURE: Informed written consent was obtained from the patient after a discussion of the risks, benefits, and alternatives to treatment. Questions regarding the procedure were encouraged and answered. The left neck and chest were prepped with chlorhexidine in a sterile fashion, and a sterile drape was applied covering the operative field. Maximum barrier sterile technique with sterile gowns and gloves were used for the procedure. A timeout was performed prior to the initiation of the procedure. After creating a small venotomy incision, a micropuncture kit was utilized to access the left internal jugular vein under direct, real-time ultrasound guidance after the overlying soft tissues were anesthetized with 1% lidocaine with epinephrine. Ultrasound  image documentation was performed. The microwire was kinked to measure appropriate catheter length. A stiff Glidewire was advanced to the level of the IVC and the micropuncture sheath was exchanged for a peel-away sheath. A palindrome tunneled hemodialysis catheter measuring 23 cm from tip to cuff was tunneled in a retrograde fashion from the anterior chest wall to the venotomy incision. The catheter was then placed through the peel-away sheath with tips ultimately positioned within the superior aspect of the right atrium. Final catheter positioning was confirmed and documented with a spot radiographic image. The catheter aspirates and flushes normally. The catheter was flushed with appropriate volume heparin dwells. The catheter exit site was secured with a 0-Prolene retention suture. The  venotomy incision was closed with Dermabond. Dressings were applied. The patient tolerated the procedure well without immediate post procedural complication. IMPRESSION: Successful placement of 23 cm tip to cuff tunneled hemodialysis catheter via the left internal jugular vein with tips terminating within the superior aspect of the right atrium. The catheter is ready for immediate use. Signed, Criselda Peaches, MD [facility] Radiology Electronically Signed   By: Jacqulynn Cadet M.D.   On: 05/26/2017 15:16        Scheduled Meds: . budesonide (PULMICORT) nebulizer solution  0.25 mg Nebulization BID  . Chlorhexidine Gluconate Cloth  6 each Topical Q0600  . cinacalcet  30 mg Oral Q M,W,F  . citalopram  10 mg Oral BID  . doxercalciferol  1 mcg Intravenous Q M,W,F-HD  . ezetimibe  10 mg Oral Daily  . feeding supplement (NEPRO CARB STEADY)  237 mL Oral BID BM  . fentaNYL      . ferric citrate  420 mg Oral TID AC  . gabapentin  100 mg Oral QHS  . insulin aspart  0-9 Units Subcutaneous TID WC  . ipratropium  0.5 mg Nebulization TID  . levalbuterol  0.63 mg Nebulization TID  . levothyroxine  100 mcg Oral QAC breakfast  . lidocaine      . midazolam      . midodrine  10 mg Oral Q M,W,F-HD  . multivitamin  1 tablet Oral Daily  . mupirocin ointment  1 application Nasal BID  . pantoprazole  40 mg Oral Daily   Continuous Infusions: . sodium chloride    . sodium chloride    . vancomycin Stopped (05/24/17 1300)     LOS: 5 days    Time spent: 40 minutes    Silvina Hackleman, Geraldo Docker, MD [facility] Pager [phone]   If 7PM-7AM, please contact night-coverage [email or web address] Password [facility] 05/26/2017, 5:57 PM

## 2017-05-26 NOTE — Anesthesia Preprocedure Evaluation (Addendum)
Anesthesia Evaluation  Patient identified by MRN, date of birth, ID band  Reviewed: Allergy & Precautions, NPO status , Patient's Chart, lab work & pertinent test results  Airway Mallampati: II  TM Distance: <3 FB Neck ROM: Full    Dental  (+) Edentulous Upper, Edentulous Lower   Pulmonary asthma , pneumonia, former smoker,    breath sounds clear to auscultation       Cardiovascular hypertension,  Rhythm:Regular Rate:Normal     Neuro/Psych    GI/Hepatic GERD  ,  Endo/Other  diabetesHypothyroidism   Renal/GU Renal disease     Musculoskeletal   Abdominal   Peds  Hematology   Anesthesia Other Findings   Reproductive/Obstetrics                            Anesthesia Physical Anesthesia Plan  ASA: III  Anesthesia Plan: MAC   Post-op Pain Management:    Induction: Intravenous  PONV Risk Score and Plan: Treatment may vary due to age or medical condition  Airway Management Planned: Simple Face Mask and Mask  Additional Equipment:   Intra-op Plan:   Post-operative Plan:   Informed Consent: I have reviewed the patients History and Physical, chart, labs and discussed the procedure including the risks, benefits and alternatives for the proposed anesthesia with the patient or authorized representative who has indicated his/her understanding and acceptance.   Dental advisory given  Plan Discussed with: CRNA, Anesthesiologist and Surgeon  Anesthesia Plan Comments:        Anesthesia Quick Evaluation

## 2017-05-26 NOTE — Progress Notes (Signed)
  Echocardiogram Echocardiogram Transesophageal has been performed.  Krystine Pabst G Ellena Kamen 05/26/2017, 9:55 AM

## 2017-05-26 NOTE — Progress Notes (Signed)
Pharmacy Antibiotic Note  [name] is a 77 y.o. female admitted on 05/21/2017 with MRSA bacteremia. ESRD MWF HD HD access holiday from 2/20 to 2/22 New HD access to be placed 2/22 with HD afterwards Repeat cultures negative to date  TTE negative for vegetation, TEE results pending  Plan: Continue Vancomycin 1 gram iv after each HD session  Doses charted to date Pre-HD vancomycin level planned for Monday  Height: 5\' 5"  (165.1 cm) Weight: 196 lb (88.9 kg) IBW/kg (Calculated) : 57  Temp (24hrs), Avg:98.5 F (36.9 C), Min:97.6 F (36.4 C), Max:98.9 F (37.2 C)  Recent Labs  Lab 05/21/17 1750 05/21/17 1855 05/22/17 0303 05/24/17 0830 05/25/17 0421 05/26/17 0452  WBC 19.6*  --  17.0* 19.3* 19.7* 17.2*  CREATININE 7.80*  --  8.15* 7.16* 4.64* 6.38*  LATICACIDVEN  --  1.75  --   --   --   --     Estimated Creatinine Clearance: 8.1 mL/min (A) (by C-G formula based on SCr of 6.38 mg/dL (H)).    Allergies  Allergen Reactions  . Amoxicillin Rash    Has patient had a PCN reaction causing immediate rash, facial/tongue/throat swelling, SOB or lightheadedness with hypotension:YES Has patient had a PCN reaction causing severe rash involving mucus membranes or skin necrosis: Yes Has patient had a PCN reaction that required hospitalization No Has patient had a PCN reaction occurring within the last 10 years: Yes If all of the above answers are "NO", then may proceed with Cephalosporin use.   . Sulfa Drugs Cross Reactors Hives and Itching  . Percocet [Oxycodone-Acetaminophen] Itching and Rash    Did not happen last time she took it  . Sulfa Antibiotics Hives   Thank you Anette Guarneri, PharmD [phone] 05/26/2017 10:20 AM

## 2017-05-26 NOTE — Progress Notes (Signed)
[facility] for Infectious Disease    Date of Admission:  05/21/2017     Total days of antibiotics 5   Day 5 Vancomycin           ID: VONDELL SOWELL is a 77 y.o. female with ESRD found to have flu,with secondary MRSA bacteremia/pna with likely HD line contamination  Principal Problem:   Acute respiratory failure with hypoxia ([place]) Active Problems:   Hypothyroidism   Anemia   Atrial fibrillation ([facility])   MRSA (methicillin resistant staph aureus) culture positive   Subjective: Afebrile overnight. WBC decreasing slowly. Still on 2L oxygen. TEE done this morning and no vegetations. Went to have TLC replaced in IR today. Repeat blood cultures no growth so far. Smiling today. Cough is better "sometimes." She has been working with flutter valve.   Medications:  . budesonide (PULMICORT) nebulizer solution  0.25 mg Nebulization BID  . Chlorhexidine Gluconate Cloth  6 each Topical Q0600  . cinacalcet  30 mg Oral Q M,W,F  . citalopram  10 mg Oral BID  . doxercalciferol  1 mcg Intravenous Q M,W,F-HD  . ezetimibe  10 mg Oral Daily  . feeding supplement (NEPRO CARB STEADY)  237 mL Oral BID BM  . fentaNYL      . ferric citrate  420 mg Oral TID AC  . gabapentin  100 mg Oral QHS  . insulin aspart  0-9 Units Subcutaneous TID WC  . ipratropium  0.5 mg Nebulization TID  . levalbuterol  0.63 mg Nebulization TID  . levothyroxine  100 mcg Oral QAC breakfast  . lidocaine      . midazolam      . midodrine  10 mg Oral Q M,W,F-HD  . multivitamin  1 tablet Oral Daily  . mupirocin ointment  1 application Nasal BID  . pantoprazole  40 mg Oral Daily    Objective: Vital signs in last 24 hours: Temp:  [97.6 F (36.4 C)-98.9 F (37.2 C)] 98.2 F (36.8 C) (02/22 1120) Pulse Rate:  [110-126] 121 (02/22 1315) Resp:  [10-33] 18 (02/22 1315) BP: (90-138)/(50-103) 118/81 (02/22 1315) SpO2:  [91 %-98 %] 97 % (02/22 1315) Weight:  [196 lb (88.9 kg)-196 lb 14.4 oz (89.3 kg)] 196 lb (88.9 kg) (02/22  0829) Physical Exam  Constitutional:  oriented to person, place, and time. Elderly but well-developed and well-nourished. No distress.  HENT: [place]/AT, PERRLA, no scleral icterus Mouth/Throat: Oropharynx is clear and moist. No oropharyngeal exudate.  Cardiovascular: Tachycardic, regular rhythm and normal heart sounds. Exam reveals no gallop and no friction rub. No murmur heard.  Pulmonary/Chest: Effort normal but diffuse rhonchi still present. Supplemental O2 Chest wall -right HD catheter has no surrounding erythema and dressing clean and dry Neck = supple, no nuchal rigidity Abdominal: Soft. Bowel sounds are normal.  exhibits no distension. There is no tenderness.  Lymphadenopathy: no cervical adenopathy. No axillary adenopathy Neurological: alert and oriented to person, place, and time.  Skin: Skin is warm and dry. No rash noted. No erythema.  Psychiatric: a normal mood and affect.  behavior is normal.   Lab Results Recent Labs    05/25/17 0421 05/26/17 0452  WBC 19.7* 17.2*  HGB 11.7* 11.1*  HCT 35.3* 34.3*  NA 139 140  K 4.1 4.0  CL 99* 100*  CO2 28 25  BUN 29* 50*  CREATININE 4.64* 6.38*   Liver Panel Recent Labs    05/24/17 0830  ALBUMIN 2.2*    Microbiology: Blood cx 05/21/17 >>  MRSA in 1 of 4 bottles (Vanc MIC < 0.5) Blood cx 2/20 >> NG 2d  Sputum cx  05/21/17 >> MRSA   Studies/Results: Ir Removal Tun Cv Cath W/o Fl  Result Date: 05/24/2017 INDICATION: History of MRSA bacteremia. Patient has a 46-year-old tunneled hemodialysis catheter. Unsure of where was placed or exactly when it was placed. Due to her bacteremia, a request has been made for removal of her HD catheter. EXAM: REMOVAL TUNNELED CENTRAL VENOUS CATHETER MEDICATIONS: 1% lidocaine with epinephrine; ANESTHESIA/SEDATION: None FLUOROSCOPY TIME:  Fluoroscopy Time: 0 minutes 0 seconds (0 mGy). COMPLICATIONS: None immediate. PROCEDURE: Informed written consent was obtained from the patient after a thorough  discussion of the procedural risks, benefits and alternatives. All questions were addressed. Maximal Sterile Barrier Technique was utilized including caps, mask, sterile gowns, sterile gloves, sterile drape, hand hygiene and skin antiseptic. A timeout was performed prior to the initiation of the procedure. The patient's right chest and catheter was prepped and draped in a normal sterile fashion. Heparin was removed from both ports of catheter. 1% lidocaine with epinephrine was used for local anesthesia. Using gentle blunt and sharp dissection the cuff of the catheter was exposed and the catheter was removed in it's entirety. Pressure was held till hemostasis was obtained. A sterile dressing was applied. The patient tolerated the procedure well with no immediate complications. IMPRESSION: Successful catheter removal as described above. Read by: Saverio Danker, PA-C Electronically Signed   By: Markus Daft M.D.   On: 05/24/2017 15:24    Assessment:  77yo F with ESRD on HD via right IJ tunneled cathter having influenza complication with secondary MRSA pneumonia and bacteremia 1/4 bottles. Her HD catheter has been in place for 2 years and previously was functioning without complication.   Plan:  1. With negative TEE and negative repeat blood cultures bacteremia likely 2/2 pneumonia and would treat for 2 weeks with Vancomycin post HD through March 8th.  2. Her HD cath has been replaced.  3. Continue supportive care for MRSA pneumonia and wean oxygen off as tolerated.  4. Encourage pulmonary hygiene and mobilization in anticipation for discharge soon.   Janene Madeira, MSN, NP-C [facility] [place] Cell: [phone] Pager: [phone]  05/26/2017, 1:42 PM

## 2017-05-26 NOTE — Anesthesia Procedure Notes (Signed)
Procedure Name: MAC Date/Time: 05/26/2017 9:05 AM Performed by: Leonor Liv, CRNA Pre-anesthesia Checklist: Patient identified, Emergency Drugs available, Suction available, Patient being monitored and Timeout performed Oxygen Delivery Method: Simple face mask Placement Confirmation: positive ETCO2

## 2017-05-26 NOTE — Interval H&P Note (Signed)
History and Physical Interval Note:  05/26/2017 9:16 AM  [name]  has presented today for surgery, with the diagnosis of BACTEREMIA  The various methods of treatment have been discussed with the patient and family. After consideration of risks, benefits and other options for treatment, the patient has consented to  Procedure(s): TRANSESOPHAGEAL ECHOCARDIOGRAM (TEE) (N/A) as a surgical intervention .  The patient's history has been reviewed, patient examined, no change in status, stable for surgery.  I have reviewed the patient's chart and labs.  Questions were answered to the patient's satisfaction.     Kirk Ruths

## 2017-05-26 NOTE — Progress Notes (Addendum)
Subjective:  Eating brk. No cos  / sp L IJ  IR placement Palindrome tunneled HD catheter.    Objective Vital signs in last 24 hours: Vitals:   05/26/17 1308 05/26/17 1314 05/26/17 1315 05/26/17 1340  BP: (!) 118/92 111/81 118/81   Pulse: (!) 122 (!) 123 (!) 121   Resp: 10 15 18    Temp:      TempSrc:      SpO2: 92% 96% 97% 94%  Weight:      Height:       Weight change: -0.087 kg (-3.1 oz)  Physical Exam: General: Alert , obese, elderly WF NAD Heart:  RRR, ,no mur, rub, gal Lungs: Faint bilat wheezes / unlabored breathing  Abdomen: obese, soft, NT, ND  Extremities:no pedal edema  Dialysis Access: L IJ P. Cath      OP Dialysis Orders:[facility] MWF 4 hrs 160 NRe 350/800 manual 89 Kg 2.0 K/2.5 Ca UFP 4 linear sodium -No heparin -Mircera 100 mcg IV q 2 weeks (last dose 05/08/17 HGB 12.7 05/17/17) -Venofer 50 mg IV weekly (last dose 05/15/17) -Hectorol 1 mcg IV TIW   Problem/Plan= 1. Acute respiratory failure: basilar/ RML PNA w/ +MRSA in blood and sputum and also flu A+.  Per primary/ID. 2. MRSA Bacteremia: TDC removed 05/24/17,line holiday until hd today  . TEE Neg ,On Vanc. ID seeing  3. ESRD - MWF-HD today after TDC replaced. Noheparin. 4. Hypertension/volume - HD 02/20 Net UF 758 Post wt 88.8 Wt. Slightly under EDW. Minimal UF with HD tomorrow. BP more stable today. Midodrine 10 mg PO TIW with HD.  5. Anemia - HGB 11.7No ESA needed. 6. Metabolic bone disease - Ca 7.5 C Ca 9.0 Phos 3.0Continue binders, VDRA, sensipar. 7. Nutrition - Albumin 2.2 in the setting of sepsis.Renal/Carb. Renal vit nepro. Add prostat. 8. H/O Afib:Coumadin on hold. INR02/19/194.39. Per pharmacy. 9. DM: per primary  Ernest Haber, PA-C [place] [facility] [phone] 05/26/2017,1:51 PM  LOS: 5 days   Pt seen, examined and agree w A/P as above.  Kelly Splinter MD [facility] pager [phone]   05/26/2017, 3:51 PM    Labs: Basic  Metabolic Panel: Recent Labs  Lab 05/24/17 0830 05/25/17 0421 05/26/17 0452  NA 140 139 140  K 4.2 4.1 4.0  CL 101 99* 100*  CO2 23 28 25   GLUCOSE 95 117* 108*  BUN 53* 29* 50*  CREATININE 7.16* 4.64* 6.38*  CALCIUM 7.5* 7.7* 7.7*  PHOS 3.0  --   --    Liver Function Tests: Recent Labs  Lab 05/21/17 1750 05/24/17 0830  AST 24  --   ALT 20  --   ALKPHOS 103  --   BILITOT 1.0  --   PROT 6.0*  --   ALBUMIN 3.1* 2.2*   CBC: Recent Labs  Lab 05/21/17 1750 05/22/17 0303 05/24/17 0830 05/25/17 0421 05/26/17 0452  WBC 19.6* 17.0* 19.3* 19.7* 17.2*  NEUTROABS 17.3*  --   --   --   --   HGB 13.6 12.7 11.8* 11.7* 11.1*  HCT 42.6 39.2 36.5 35.3* 34.3*  MCV 98.6 97.3 97.1 95.9 96.6  PLT 216 198 248 237 253   Cardiac Enzymes: Recent Labs  Lab 05/21/17 2250 05/22/17 0303 05/22/17 1738  TROPONINI 0.03* 0.03* 0.26*   CBG: Recent Labs  Lab 05/25/17 1148 05/25/17 1613 05/25/17 2137 05/26/17 0754 05/26/17 1123  GLUCAP 147* 147* 140* 113* 93    Medications: . sodium chloride    . sodium  chloride    . vancomycin Stopped (05/24/17 1300)   . budesonide (PULMICORT) nebulizer solution  0.25 mg Nebulization BID  . Chlorhexidine Gluconate Cloth  6 each Topical Q0600  . cinacalcet  30 mg Oral Q M,W,F  . citalopram  10 mg Oral BID  . doxercalciferol  1 mcg Intravenous Q M,W,F-HD  . ezetimibe  10 mg Oral Daily  . feeding supplement (NEPRO CARB STEADY)  237 mL Oral BID BM  . fentaNYL      . ferric citrate  420 mg Oral TID AC  . gabapentin  100 mg Oral QHS  . insulin aspart  0-9 Units Subcutaneous TID WC  . ipratropium  0.5 mg Nebulization TID  . levalbuterol  0.63 mg Nebulization TID  . levothyroxine  100 mcg Oral QAC breakfast  . lidocaine      . midazolam      . midodrine  10 mg Oral Q M,W,F-HD  . multivitamin  1 tablet Oral Daily  . mupirocin ointment  1 application Nasal BID  . pantoprazole  40 mg Oral Daily

## 2017-05-26 NOTE — Progress Notes (Signed)
ANTICOAGULATION CONSULT NOTE Pharmacy Consult for Warfarin Indication: chest pain/ACS and atrial fibrillation  Allergies  Allergen Reactions  . Amoxicillin Rash    Has patient had a PCN reaction causing immediate rash, facial/tongue/throat swelling, SOB or lightheadedness with hypotension:YES Has patient had a PCN reaction causing severe rash involving mucus membranes or skin necrosis: Yes Has patient had a PCN reaction that required hospitalization No Has patient had a PCN reaction occurring within the last 10 years: Yes If all of the above answers are "NO", then may proceed with Cephalosporin use.   . Sulfa Drugs Cross Reactors Hives and Itching  . Percocet [Oxycodone-Acetaminophen] Itching and Rash    Did not happen last time she took it  . Sulfa Antibiotics Hives   Labs: Recent Labs    05/24/17 0830 05/25/17 0421 05/26/17 0452  HGB 11.8* 11.7* 11.1*  HCT 36.5 35.3* 34.3*  PLT 248 237 253  LABPROT  --  23.0* 16.5*  INR  --  2.05 1.35  CREATININE 7.16* 4.64* 6.38*    Estimated Creatinine Clearance: 8.1 mL/min (A) (by C-G formula based on SCr of 6.38 mg/dL (H)).     Assessment: 77 year old female on warfarin prior to admission for Afib Warfarin on hold pending HD access placement today  Goal of Therapy:  INR 2-3 Monitor platelets by anticoagulation protocol: Yes   Plan:  Hold warfarin Re-enter protocol when ok to resume warfarin Daily INR  Thank you Anette Guarneri, PharmD [phone] 05/26/2017,1:49 PM

## 2017-05-27 ENCOUNTER — Encounter (HOSPITAL_COMMUNITY): Payer: Self-pay | Admitting: Cardiology

## 2017-05-27 DIAGNOSIS — E038 Other specified hypothyroidism: Secondary | ICD-10-CM

## 2017-05-27 LAB — CBC
HCT: 35.3 % — ABNORMAL LOW (ref 36.0–46.0)
Hemoglobin: 11.8 g/dL — ABNORMAL LOW (ref 12.0–15.0)
MCH: 32 pg (ref 26.0–34.0)
MCHC: 33.4 g/dL (ref 30.0–36.0)
MCV: 95.7 fL (ref 78.0–100.0)
Platelets: 281 10*3/uL (ref 150–400)
RBC: 3.69 MIL/uL — ABNORMAL LOW (ref 3.87–5.11)
RDW: 16.7 % — ABNORMAL HIGH (ref 11.5–15.5)
WBC: 16.6 10*3/uL — ABNORMAL HIGH (ref 4.0–10.5)

## 2017-05-27 LAB — BASIC METABOLIC PANEL
Anion gap: 13 (ref 5–15)
BUN: 23 mg/dL — ABNORMAL HIGH (ref 6–20)
CO2: 26 mmol/L (ref 22–32)
Calcium: 7.9 mg/dL — ABNORMAL LOW (ref 8.9–10.3)
Chloride: 100 mmol/L — ABNORMAL LOW (ref 101–111)
Creatinine, Ser: 3.88 mg/dL — ABNORMAL HIGH (ref 0.44–1.00)
GFR calc Af Amer: 12 mL/min — ABNORMAL LOW (ref >60)
GFR calc non Af Amer: 10 mL/min — ABNORMAL LOW (ref >60)
Glucose, Bld: 135 mg/dL — ABNORMAL HIGH (ref 65–99)
Potassium: 4.4 mmol/L (ref 3.5–5.1)
Sodium: 139 mmol/L (ref 135–145)

## 2017-05-27 LAB — PROTIME-INR
INR: 1.2
Prothrombin Time: 15.2 seconds (ref 11.4–15.2)

## 2017-05-27 LAB — HEPATITIS B SURFACE ANTIGEN: Hepatitis B Surface Ag: NEGATIVE

## 2017-05-27 LAB — GLUCOSE, CAPILLARY
Glucose-Capillary: 127 mg/dL — ABNORMAL HIGH (ref 65–99)
Glucose-Capillary: 109 mg/dL — ABNORMAL HIGH (ref 65–99)
Glucose-Capillary: 105 mg/dL — ABNORMAL HIGH (ref 65–99)
Glucose-Capillary: 93 mg/dL (ref 65–99)

## 2017-05-27 LAB — MAGNESIUM: Magnesium: 1.9 mg/dL (ref 1.7–2.4)

## 2017-05-27 MED ORDER — DOXERCALCIFEROL 4 MCG/2ML IV SOLN
INTRAVENOUS | Status: AC
  Administered 2017-05-27: 1 ug via INTRAVENOUS
  Filled 2017-05-27: qty 2

## 2017-05-27 MED ORDER — WARFARIN SODIUM 10 MG PO TABS
10.0000 mg | ORAL_TABLET | Freq: Once | ORAL | Status: AC
  Administered 2017-05-27: 10 mg via ORAL
  Filled 2017-05-27: qty 1

## 2017-05-27 MED ORDER — ACETYLCYSTEINE 20 % IN SOLN
4.0000 mL | Freq: Two times a day (BID) | RESPIRATORY_TRACT | Status: DC
  Administered 2017-05-27 (×2): 4 mL via RESPIRATORY_TRACT
  Filled 2017-05-27 (×4): qty 4

## 2017-05-27 MED ORDER — VANCOMYCIN HCL IN DEXTROSE 1-5 GM/200ML-% IV SOLN
INTRAVENOUS | Status: AC
  Filled 2017-05-27: qty 200

## 2017-05-27 NOTE — Progress Notes (Signed)
PROGRESS NOTE    [name]  WUJ:[id number] DOB: Mar 26, 1941 DOA: 05/21/2017 PCP: Dione Housekeeper, MD   Brief Narrative:  [name] PMHx ESRD on HD M/W/F Paroxysmal  A-Fib , HTN, Hypothyroidism, and DM Type 2    Brought to the ER w/ persistent shortness of breath and right-sided chest pain.  She was started on antibiotics and prednisone on February 11 by her PCP, and also treated w/ Tamiflu     Subjective: 2/23 A/O 4, positive SOB, positive productive cough (yellow). Negative abdominal pain. Negative N/V        Assessment & Plan:   Principal Problem:   Acute respiratory failure with hypoxia (HCC) Active Problems:   Hypothyroidism   Anemia   Atrial fibrillation (HCC)   MRSA (methicillin resistant staph aureus) culture positive  Acute Respiratory Failure with Hypoxia/positive MRSA pneumonia -Continue current antibiotics -budesonide nebulizer BID -ipratropium nebulizerTID -Xopenex nebulizer TID  -flutter valve -Mucomyst nebulizer BID -Aggressive pulmonary toilet -ambulate in hall q shift -see goals of care  Positive MRSA bacteremia - continue current course of antibiotics -2/20 S/P HD cath removed -2/22 S/P TEE; negative vegetation see results below  -2/23 patient will need complete 2 week course of IV antibiotics to be given post HD  Sinus Tachycardia -Most likely secondary to bacteremia/pneumonia.  -TSH normal  -resolved  Chronic paroxysmal atrial fibrillation -currently in NSR -Coumadin per pharmacy Recent Labs  Lab 05/21/17 1750 05/22/17 0303 05/23/17 0349 05/25/17 0421 05/26/17 0452 05/27/17 0410  INR 4.43* 5.30* 4.39* 2.05 1.35 1.20  -2/20 supratherapeutic. Hold Coumadin  -2/21 vitamin K IV 1 mg. Nephrology to place a tunneled HD cath on 2/22 in the AM and would like INR< 1.8 -2/22 restart Coumadin per pharmacy  Chronic Hypotension  -Midodrin 10 mg with HD   ESRD on  HD M/W/F  -HD per nephrology -2/22 S/P placement of LEFT IJ tunneled HD  cath    Hypothyroidism -Synthroid 100 g daily  Diabetes type 2 controlled with complication -12/08/6211 Hemoglobin A1c= 5.4 -2/21 Hemoglobin A1c= 4.5 -Lipid panel within ADA guidelines   Goals care -2/23 PT/OT: Patient with MRSA pneumonia, MRSA bacteremia. End-stage renal disease on hemodialysis. Debilitated Evaluate for SNF vs CIR placement.    DVT prophylaxis: Coumadin Code Status: DO NOT RESUSCITATE Family Communication: Family at bedside for discussion of plan of care Disposition Plan: SNF?   Consultants:  Nephrology ID IR   Procedures/Significant Events:  2/19 Echocardiogram:Left ventricle moderate  concentric hypertrophy. LVEF= 60% to 65%.  - Left atrium: severely dilated. - Pulmonary arteries: Systolic pressure was within the normal  range. PA peak pressure: 26 mm Hg (S). 2/22 TEE: - Left ventricle: Hypertrophy was noted. -LVEF=55%- 60%.  - Left atrium: moderately dilated. No evidence of  thrombus in the atrial cavity or appendage. - Right atrium: moderately dilated. No evidence of thrombus in the atrial cavity or appendage. - Atrial septum: No defect or patent foramen ovale was identified. Positive atrial septal aneurysm.       I have personally reviewed and interpreted all radiology studies and my findings are as above.  VENTILATOR SETTINGS:    Cultures 2/17 Blood Positive MRSA  2/17 Sputum Positive MRSA    Antimicrobials: Anti-infectives (From admission, onward)   Start     Stop   05/24/17 1200  vancomycin (VANCOCIN) IVPB 1000 mg/200 mL premix         05/22/17 1800  vancomycin (VANCOCIN) IVPB 1000 mg/200 mL premix     05/23/17 1315  05/22/17 1604  vancomycin (VANCOCIN) 1-5 GM/200ML-% IVPB    Comments:  Ashley Akin   : cabinet override   05/23/17 0414   05/22/17 0800  aztreonam (AZACTAM) 500 mg in dextrose 5 % 50 mL IVPB  Status:  Discontinued     05/22/17 1710   05/21/17 2100  vancomycin (VANCOCIN) IVPB 1000 mg/200 mL premix     05/21/17  2243   05/21/17 1845  aztreonam (AZACTAM) 2 g in sodium chloride 0.9 % 100 mL IVPB     05/21/17 2032   05/21/17 1845  vancomycin (VANCOCIN) IVPB 1000 mg/200 mL premix     05/21/17 2122       Devices    LINES / TUBES:  Tunneled HD cath ??>> 2/20 Palindrome LEFT IJ Tunneled HD Cath 2/22>>     Continuous Infusions: . sodium chloride    . sodium chloride    . vancomycin    . vancomycin Stopped (05/24/17 1300)     Objective: Vitals:   05/27/17 0100 05/27/17 0130 05/27/17 0428 05/27/17 0744  BP: 136/87 (!) 135/97 (!) 129/98 114/86  Pulse: (!) 114 (!) 118 (!) 121 (!) 116  Resp: '18 18 18   ' Temp:   99 F (37.2 C) 98.2 F (36.8 C)  TempSrc:   Oral Oral  SpO2: 100% 100% 93% 94%  Weight:   189 lb 4.8 oz (85.9 kg)   Height:        Intake/Output Summary (Last 24 hours) at 05/27/2017 0745 Last data filed at 05/27/2017 0230 Gross per 24 hour  Intake 820 ml  Output -  Net 820 ml   Filed Weights   05/26/17 0829 05/26/17 2220 05/27/17 0428  Weight: 196 lb (88.9 kg) 215 lb 13.3 oz (97.9 kg) 189 lb 4.8 oz (85.9 kg)    Physical Exam:  General: A/O 4, positive acute respiratory distress Neck:  Negative scars, masses, torticollis, lymphadenopathy, JVD Lungs: positive diffuse rhonchi,  negative crackles, positive mild diffuse expiratory wheeze.  Cardiovascular: Regular rate and rhythm without murmur gallop or rub normal S1 and S2 Abdomen: negative abdominal pain, nondistended, positive soft, bowel sounds, no rebound, no ascites, no appreciable mass Extremities: No significant cyanosis, clubbing, or edema bilateral lower extremities Skin: Negative rashes, lesions, ulcers Psychiatric:  Negative depression, negative anxiety, negative fatigue, negative mania  Central nervous system:  Cranial nerves II through XII intact, tongue/uvula midline, all extremities muscle strength 5/5, sensation intact throughout,  negative dysarthria, negative expressive aphasia, negative receptive  aphasia  .     Data Reviewed: Care during the described time interval was provided by me .  I have reviewed this patient's available data, including medical history, events of note, physical examination, and all test results as part of my evaluation.   CBC: Recent Labs  Lab 05/21/17 1750 05/22/17 0303 05/24/17 0830 05/25/17 0421 05/26/17 0452 05/27/17 0410  WBC 19.6* 17.0* 19.3* 19.7* 17.2* 16.6*  NEUTROABS 17.3*  --   --   --   --   --   HGB 13.6 12.7 11.8* 11.7* 11.1* 11.8*  HCT 42.6 39.2 36.5 35.3* 34.3* 35.3*  MCV 98.6 97.3 97.1 95.9 96.6 95.7  PLT 216 198 248 237 253 923   Basic Metabolic Panel: Recent Labs  Lab 05/22/17 0303 05/24/17 0830 05/25/17 0421 05/26/17 0452 05/27/17 0410  NA 135 140 139 140 139  K 3.8 4.2 4.1 4.0 4.4  CL 97* 101 99* 100* 100*  CO2 20* '23 28 25 26  ' GLUCOSE 163* 95 117*  108* 135*  BUN 62* 53* 29* 50* 23*  CREATININE 8.15* 7.16* 4.64* 6.38* 3.88*  CALCIUM 6.3* 7.5* 7.7* 7.7* 7.9*  MG  --   --  1.9 1.8 1.9  PHOS  --  3.0  --   --   --    GFR: Estimated Creatinine Clearance: 13.1 mL/min (A) (by C-G formula based on SCr of 3.88 mg/dL (H)). Liver Function Tests: Recent Labs  Lab 05/21/17 1750 05/24/17 0830  AST 24  --   ALT 20  --   ALKPHOS 103  --   BILITOT 1.0  --   PROT 6.0*  --   ALBUMIN 3.1* 2.2*   No results for input(s): LIPASE, AMYLASE in the last 168 hours. No results for input(s): AMMONIA in the last 168 hours. Coagulation Profile: Recent Labs  Lab 05/22/17 0303 05/23/17 0349 05/25/17 0421 05/26/17 0452 05/27/17 0410  INR 5.30* 4.39* 2.05 1.35 1.20   Cardiac Enzymes: Recent Labs  Lab 05/21/17 2250 05/22/17 0303 05/22/17 1738  TROPONINI 0.03* 0.03* 0.26*   BNP (last 3 results) No results for input(s): PROBNP in the last 8760 hours. HbA1C: Recent Labs    05/25/17 0421  HGBA1C 4.5*   CBG: Recent Labs  Lab 05/25/17 2137 05/26/17 0754 05/26/17 1123 05/26/17 1638 05/26/17 2135  GLUCAP 140* 113*  93 165* 125*   Lipid Profile: Recent Labs    05/25/17 0421  CHOL 107  HDL 32*  LDLCALC 57  TRIG 88  CHOLHDL 3.3   Thyroid Function Tests: No results for input(s): TSH, T4TOTAL, FREET4, T3FREE, THYROIDAB in the last 72 hours. Anemia Panel: No results for input(s): VITAMINB12, FOLATE, FERRITIN, TIBC, IRON, RETICCTPCT in the last 72 hours. Urine analysis:    Component Value Date/Time   COLORURINE YELLOW 07/26/2013 1202   APPEARANCEUR CLEAR 07/26/2013 1202   LABSPEC 1.016 07/26/2013 1202   PHURINE 7.0 07/26/2013 1202   GLUCOSEU NEGATIVE 07/26/2013 1202   HGBUR SMALL (A) 07/26/2013 1202   BILIRUBINUR NEGATIVE 07/26/2013 1202   KETONESUR NEGATIVE 07/26/2013 1202   PROTEINUR 100 (A) 07/26/2013 1202   UROBILINOGEN 1.0 07/26/2013 1202   NITRITE NEGATIVE 07/26/2013 1202   LEUKOCYTESUR SMALL (A) 07/26/2013 1202   Sepsis Labs: '@LABRCNTIP' (procalcitonin:4,lacticidven:4)  ) Recent Results (from the past 240 hour(s))  Blood Culture (routine x 2)     Status: Abnormal   Collection Time: 05/21/17  6:41 PM  Result Value Ref Range Status   Specimen Description BLOOD RIGHT FOREARM  Final   Special Requests   Final    BOTTLES DRAWN AEROBIC AND ANAEROBIC Blood Culture adequate volume   Culture  Setup Time   Final    GRAM POSITIVE COCCI ANAEROBIC BOTTLE ONLY CRITICAL RESULT CALLED TO, READ BACK BY AND VERIFIED WITHEzekiel Slocumb PHARMD 1508 05/22/17 A BROWNING Performed at [facility], [place] [address] [place], [place] [address]    Culture METHICILLIN RESISTANT STAPHYLOCOCCUS AUREUS (A)  Final   Report Status 05/24/2017 FINAL  Final   Organism ID, Bacteria METHICILLIN RESISTANT STAPHYLOCOCCUS AUREUS  Final      Susceptibility   Methicillin resistant staphylococcus aureus - MIC*    CIPROFLOXACIN >=8 RESISTANT Resistant     ERYTHROMYCIN >=8 RESISTANT Resistant     GENTAMICIN <=0.5 SENSITIVE Sensitive     OXACILLIN >=4 RESISTANT Resistant     TETRACYCLINE <=1 SENSITIVE Sensitive      VANCOMYCIN <=0.5 SENSITIVE Sensitive     TRIMETH/SULFA <=10 SENSITIVE Sensitive     CLINDAMYCIN <=0.25 SENSITIVE Sensitive  RIFAMPIN <=0.5 SENSITIVE Sensitive     Inducible Clindamycin NEGATIVE Sensitive     * METHICILLIN RESISTANT STAPHYLOCOCCUS AUREUS  Blood Culture ID Panel (Reflexed)     Status: Abnormal   Collection Time: 05/21/17  6:41 PM  Result Value Ref Range Status   Enterococcus species NOT DETECTED NOT DETECTED Final   Listeria monocytogenes NOT DETECTED NOT DETECTED Final   Staphylococcus species DETECTED (A) NOT DETECTED Final    Comment: CRITICAL RESULT CALLED TO, READ BACK BY AND VERIFIED WITH: E SINCLAIR PHARMD 1508 05/22/17 A BROWNING    Staphylococcus aureus DETECTED (A) NOT DETECTED Final    Comment: Methicillin (oxacillin)-resistant Staphylococcus aureus (MRSA). MRSA is predictably resistant to beta-lactam antibiotics (except ceftaroline). Preferred therapy is vancomycin unless clinically contraindicated. Patient requires contact precautions if  hospitalized. CRITICAL RESULT CALLED TO, READ BACK BY AND VERIFIED WITH: E SINCLAIR PHARMD 1508 05/22/17 A BROWNING    Methicillin resistance DETECTED (A) NOT DETECTED Final    Comment: CRITICAL RESULT CALLED TO, READ BACK BY AND VERIFIED WITH: E SINCLAIR PHARMD 1508 05/22/17 A BROWNING    Streptococcus species NOT DETECTED NOT DETECTED Final   Streptococcus agalactiae NOT DETECTED NOT DETECTED Final   Streptococcus pneumoniae NOT DETECTED NOT DETECTED Final   Streptococcus pyogenes NOT DETECTED NOT DETECTED Final   Acinetobacter baumannii NOT DETECTED NOT DETECTED Final   Enterobacteriaceae species NOT DETECTED NOT DETECTED Final   Enterobacter cloacae complex NOT DETECTED NOT DETECTED Final   Escherichia coli NOT DETECTED NOT DETECTED Final   Klebsiella oxytoca NOT DETECTED NOT DETECTED Final   Klebsiella pneumoniae NOT DETECTED NOT DETECTED Final   Proteus species NOT DETECTED NOT DETECTED Final   Serratia  marcescens NOT DETECTED NOT DETECTED Final   Haemophilus influenzae NOT DETECTED NOT DETECTED Final   Neisseria meningitidis NOT DETECTED NOT DETECTED Final   Pseudomonas aeruginosa NOT DETECTED NOT DETECTED Final   Candida albicans NOT DETECTED NOT DETECTED Final   Candida glabrata NOT DETECTED NOT DETECTED Final   Candida krusei NOT DETECTED NOT DETECTED Final   Candida parapsilosis NOT DETECTED NOT DETECTED Final   Candida tropicalis NOT DETECTED NOT DETECTED Final    Comment: Performed at [facility], [place]. [address], [place], [place] [address]  Blood Culture (routine x 2)     Status: None   Collection Time: 05/21/17  6:46 PM  Result Value Ref Range Status   Specimen Description BLOOD LEFT ANTECUBITAL  Final   Special Requests   Final    BOTTLES DRAWN AEROBIC AND ANAEROBIC Blood Culture adequate volume   Culture   Final    NO GROWTH 5 DAYS Performed at [facility], [place] [address], [place], [place] [address]    Report Status 05/26/2017 FINAL  Final  Culture, expectorated sputum-assessment     Status: None   Collection Time: 05/21/17  7:03 PM  Result Value Ref Range Status   Specimen Description SPUTUM  Final   Special Requests NONE  Final   Sputum evaluation   Final    THIS SPECIMEN IS ACCEPTABLE FOR SPUTUM CULTURE Performed at [facility], [place] [address] [place],  [address]    Report Status 05/22/2017 FINAL  Final  Culture, respiratory (NON-Expectorated)     Status: None   Collection Time: 05/21/17  7:03 PM  Result Value Ref Range Status   Specimen Description SPUTUM  Final   Special Requests NONE Reflexed from [id number]  Final   Gram Stain   Final  FEW WBC PRESENT, PREDOMINANTLY PMN ABUNDANT GRAM POSITIVE COCCI IN CLUSTERS MODERATE GRAM NEGATIVE RODS FEW YEAST Performed at [facility], [place] [address], [place], [place] [address]    Culture   Final    ABUNDANT METHICILLIN RESISTANT STAPHYLOCOCCUS AUREUS   Report Status 05/24/2017 FINAL   Final   Organism ID, Bacteria METHICILLIN RESISTANT STAPHYLOCOCCUS AUREUS  Final      Susceptibility   Methicillin resistant staphylococcus aureus - MIC*    CIPROFLOXACIN >=8 RESISTANT Resistant     ERYTHROMYCIN >=8 RESISTANT Resistant     GENTAMICIN <=0.5 SENSITIVE Sensitive     OXACILLIN >=4 RESISTANT Resistant     TETRACYCLINE <=1 SENSITIVE Sensitive     VANCOMYCIN <=0.5 SENSITIVE Sensitive     TRIMETH/SULFA <=10 SENSITIVE Sensitive     CLINDAMYCIN <=0.25 SENSITIVE Sensitive     RIFAMPIN <=0.5 SENSITIVE Sensitive     Inducible Clindamycin NEGATIVE Sensitive     * ABUNDANT METHICILLIN RESISTANT STAPHYLOCOCCUS AUREUS  MRSA PCR Screening     Status: Abnormal   Collection Time: 05/24/17  7:28 PM  Result Value Ref Range Status   MRSA by PCR POSITIVE (A) NEGATIVE Final    Comment:        The GeneXpert MRSA Assay (FDA approved for NASAL specimens only), is one component of a comprehensive MRSA colonization surveillance program. It is not intended to diagnose MRSA infection nor to guide or monitor treatment for MRSA infections. RESULT CALLED TO, READ BACK BY AND VERIFIED WITH: D.RODGERS AT 0157 05/25/17 BY L.PITT   Culture, blood (routine x 2)     Status: None (Preliminary result)   Collection Time: 05/24/17  7:35 PM  Result Value Ref Range Status   Specimen Description BLOOD RIGHT ARM  Final   Special Requests   Final    BOTTLES DRAWN AEROBIC AND ANAEROBIC Blood Culture adequate volume   Culture   Final    NO GROWTH 2 DAYS Performed at [facility], [address], [place], [place] [address]    Report Status PENDING  Incomplete  Culture, blood (routine x 2)     Status: None (Preliminary result)   Collection Time: 05/24/17  7:40 PM  Result Value Ref Range Status   Specimen Description BLOOD LEFT ARM  Final   Special Requests   Final    BOTTLES DRAWN AEROBIC AND ANAEROBIC Blood Culture adequate volume   Culture   Final    NO GROWTH 2 DAYS Performed at [facility], [address] [place], [place] [address]    Report Status PENDING  Incomplete  Culture, blood (Routine X 2) w Reflex to ID Panel     Status: None (Preliminary result)   Collection Time: 05/25/17 10:05 AM  Result Value Ref Range Status   Specimen Description BLOOD LEFT HAND  Final   Special Requests   Final    Blood Culture adequate volume BOTTLES DRAWN AEROBIC AND ANAEROBIC   Culture   Final    NO GROWTH 1 DAY Performed at [facility], [place] [address], [place], [place] [address]    Report Status PENDING  Incomplete  Culture, blood (Routine X 2) w Reflex to ID Panel     Status: None (Preliminary result)   Collection Time: 05/25/17 10:15 AM  Result Value Ref Range Status   Specimen Description BLOOD LEFT HAND  Final   Special Requests   Final    Blood Culture adequate volume BOTTLES DRAWN AEROBIC AND ANAEROBIC  Culture   Final    NO GROWTH 1 DAY Performed at [facility], [place] [address] [place], [place] [address]    Report Status PENDING  Incomplete         Radiology Studies: Ir Fluoro Guide Cv Line Right  Result Date: 05/26/2017 INDICATION: 77 year old female with end-stage renal disease on hemodialysis. She was recently admitted with influenza and MRSA pneumonia requiring removal of her existing right IJ tunneled hemodialysis catheter. She has now had a line holiday and is suitable for replacement. We will proceed with placement of a left IJ approach tunneled hemodialysis catheter. EXAM: TUNNELED CENTRAL VENOUS HEMODIALYSIS CATHETER PLACEMENT WITH ULTRASOUND AND FLUOROSCOPIC GUIDANCE MEDICATIONS: In patient currently on IV antibiotic coverage. No additional antibiotic prophylaxis administered. ANESTHESIA/SEDATION: Moderate (conscious) sedation was employed during this procedure. A total of Versed 1 mg and Fentanyl 50 mcg was administered intravenously. Moderate Sedation Time: 15 minutes. The patient's level of consciousness and vital signs were monitored  continuously by radiology nursing throughout the procedure under my direct supervision. FLUOROSCOPY TIME:  Fluoroscopy Time: 0 minutes 24 seconds (3 mGy). COMPLICATIONS: None immediate. PROCEDURE: Informed written consent was obtained from the patient after a discussion of the risks, benefits, and alternatives to treatment. Questions regarding the procedure were encouraged and answered. The left neck and chest were prepped with chlorhexidine in a sterile fashion, and a sterile drape was applied covering the operative field. Maximum barrier sterile technique with sterile gowns and gloves were used for the procedure. A timeout was performed prior to the initiation of the procedure. After creating a small venotomy incision, a micropuncture kit was utilized to access the left internal jugular vein under direct, real-time ultrasound guidance after the overlying soft tissues were anesthetized with 1% lidocaine with epinephrine. Ultrasound image documentation was performed. The microwire was kinked to measure appropriate catheter length. A stiff Glidewire was advanced to the level of the IVC and the micropuncture sheath was exchanged for a peel-away sheath. A palindrome tunneled hemodialysis catheter measuring 23 cm from tip to cuff was tunneled in a retrograde fashion from the anterior chest wall to the venotomy incision. The catheter was then placed through the peel-away sheath with tips ultimately positioned within the superior aspect of the right atrium. Final catheter positioning was confirmed and documented with a spot radiographic image. The catheter aspirates and flushes normally. The catheter was flushed with appropriate volume heparin dwells. The catheter exit site was secured with a 0-Prolene retention suture. The venotomy incision was closed with Dermabond. Dressings were applied. The patient tolerated the procedure well without immediate post procedural complication. IMPRESSION: Successful placement of 23 cm  tip to cuff tunneled hemodialysis catheter via the left internal jugular vein with tips terminating within the superior aspect of the right atrium. The catheter is ready for immediate use. Signed, Criselda Peaches, MD [facility] Radiology Electronically Signed   By: Jacqulynn Cadet M.D.   On: 05/26/2017 15:16   Ir US Guide Vasc Access Right  Result Date: 05/26/2017 INDICATION: 77 year old female with end-stage renal disease on hemodialysis. She was recently admitted with influenza and MRSA pneumonia requiring removal of her existing right IJ tunneled hemodialysis catheter. She has now had a line holiday and is suitable for replacement. We will proceed with placement of a left IJ approach tunneled hemodialysis catheter. EXAM: TUNNELED CENTRAL VENOUS HEMODIALYSIS CATHETER PLACEMENT WITH ULTRASOUND AND FLUOROSCOPIC GUIDANCE MEDICATIONS: In patient currently on IV antibiotic coverage. No additional antibiotic prophylaxis administered. ANESTHESIA/SEDATION:  Moderate (conscious) sedation was employed during this procedure. A total of Versed 1 mg and Fentanyl 50 mcg was administered intravenously. Moderate Sedation Time: 15 minutes. The patient's level of consciousness and vital signs were monitored continuously by radiology nursing throughout the procedure under my direct supervision. FLUOROSCOPY TIME:  Fluoroscopy Time: 0 minutes 24 seconds (3 mGy). COMPLICATIONS: None immediate. PROCEDURE: Informed written consent was obtained from the patient after a discussion of the risks, benefits, and alternatives to treatment. Questions regarding the procedure were encouraged and answered. The left neck and chest were prepped with chlorhexidine in a sterile fashion, and a sterile drape was applied covering the operative field. Maximum barrier sterile technique with sterile gowns and gloves were used for the procedure. A timeout was performed prior to the initiation of the  procedure. After creating a small venotomy incision, a micropuncture kit was utilized to access the left internal jugular vein under direct, real-time ultrasound guidance after the overlying soft tissues were anesthetized with 1% lidocaine with epinephrine. Ultrasound image documentation was performed. The microwire was kinked to measure appropriate catheter length. A stiff Glidewire was advanced to the level of the IVC and the micropuncture sheath was exchanged for a peel-away sheath. A palindrome tunneled hemodialysis catheter measuring 23 cm from tip to cuff was tunneled in a retrograde fashion from the anterior chest wall to the venotomy incision. The catheter was then placed through the peel-away sheath with tips ultimately positioned within the superior aspect of the right atrium. Final catheter positioning was confirmed and documented with a spot radiographic image. The catheter aspirates and flushes normally. The catheter was flushed with appropriate volume heparin dwells. The catheter exit site was secured with a 0-Prolene retention suture. The venotomy incision was closed with Dermabond. Dressings were applied. The patient tolerated the procedure well without immediate post procedural complication. IMPRESSION: Successful placement of 23 cm tip to cuff tunneled hemodialysis catheter via the left internal jugular vein with tips terminating within the superior aspect of the right atrium. The catheter is ready for immediate use. Signed, Criselda Peaches, MD [facility] Radiology Electronically Signed   By: Jacqulynn Cadet M.D.   On: 05/26/2017 15:16        Scheduled Meds: . budesonide (PULMICORT) nebulizer solution  0.25 mg Nebulization BID  . Chlorhexidine Gluconate Cloth  6 each Topical Q0600  . cinacalcet  30 mg Oral Q M,W,F  . citalopram  10 mg Oral BID  . doxercalciferol  1 mcg Intravenous Q M,W,F-HD  . ezetimibe  10 mg Oral Daily  .  feeding supplement (NEPRO CARB STEADY)  237 mL Oral BID BM  . ferric citrate  420 mg Oral TID AC  . gabapentin  100 mg Oral QHS  . insulin aspart  0-9 Units Subcutaneous TID WC  . ipratropium  0.5 mg Nebulization TID  . levalbuterol  0.63 mg Nebulization TID  . levothyroxine  100 mcg Oral QAC breakfast  . midodrine  10 mg Oral Q M,W,F-HD  . multivitamin  1 tablet Oral Daily  . mupirocin ointment  1 application Nasal BID  . pantoprazole  40 mg Oral Daily  . polyethylene glycol  17 g Oral Daily  . Warfarin - Pharmacist Dosing Inpatient   Does not apply q1800   Continuous Infusions: . sodium chloride    . sodium chloride    . vancomycin    . vancomycin Stopped (05/24/17 1300)     LOS: 6 days    Time  spent: 40 minutes    Clydene Burack, Geraldo Docker, MD [facility] Pager [phone]   If 7PM-7AM, please contact night-coverage [email or web address] Password [facility] 05/27/2017, 7:45 AM

## 2017-05-27 NOTE — Progress Notes (Signed)
Subjective:  No c/o, still coughing some  Objective Vital signs in last 24 hours: Vitals:   05/27/17 0428 05/27/17 0744 05/27/17 1019 05/27/17 1202  BP: (!) 129/98 114/86  116/77  Pulse: (!) 121 (!) 116  90  Resp: 18     Temp: 99 F (37.2 C) 98.2 F (36.8 C)  98.1 F (36.7 C)  TempSrc: Oral Oral  Oral  SpO2: 93% 94% 97% 97%  Weight: 85.9 kg (189 lb 4.8 oz)     Height:       Weight change: -0.408 kg (-14.4 oz)  Physical Exam: General: Alert , obese, elderly WF NAD Heart:  RRR, ,no mur, rub, gal Lungs: scattered rhonchi Abdomen: obese, soft, NT, ND  Extremities:no pedal edema  Dialysis Access: L IJ P. Cath      OP Dialysis Orders:[facility] MWF 4 hrs 160 NRe 350/800 manual 89 Kg 2.0 K/2.5 Ca UFP 4 linear sodium -No heparin -Mircera 100 mcg IV q 2 weeks (last dose 05/08/17 HGB 12.7 05/17/17) -Venofer 50 mg IV weekly (last dose 05/15/17) -Hectorol 1 mcg IV TIW   Problem/Plan: 1. MRSA PNA/ bacteremia - IV abx, TDC removed and replaced. TEE neg 2. ESRD - cont MWF. HD 2/25 3. Hypertension/volume - under dry by 3kg, exam stable, on Midodrine 10tid 4. Anemia ckd- HGB 11.7No ESA needed. 5. Metabolic bone disease - continue binders, VDRA, sensipar. 6. Nutrition - Albumin 2.2 in the setting of sepsis.Renal/Carb. Renal vit nepro. Add prostat. 7. H/O Afib:Coumadin on hold. INR02/19/194.39. Per pharmacy. 8. DM: per primary   Kelly Splinter MD [facility] pager [phone]   05/27/2017, 1:27 PM    Labs: Basic Metabolic Panel: Recent Labs  Lab 05/24/17 0830 05/25/17 0421 05/26/17 0452 05/27/17 0410  NA 140 139 140 139  K 4.2 4.1 4.0 4.4  CL 101 99* 100* 100*  CO2 23 28 25 26   GLUCOSE 95 117* 108* 135*  BUN 53* 29* 50* 23*  CREATININE 7.16* 4.64* 6.38* 3.88*  CALCIUM 7.5* 7.7* 7.7* 7.9*  PHOS 3.0  --   --   --    Liver Function Tests: Recent Labs  Lab 05/21/17 1750 05/24/17 0830  AST 24  --   ALT 20  --   ALKPHOS 103  --    BILITOT 1.0  --   PROT 6.0*  --   ALBUMIN 3.1* 2.2*   CBC: Recent Labs  Lab 05/21/17 1750 05/22/17 0303 05/24/17 0830 05/25/17 0421 05/26/17 0452 05/27/17 0410  WBC 19.6* 17.0* 19.3* 19.7* 17.2* 16.6*  NEUTROABS 17.3*  --   --   --   --   --   HGB 13.6 12.7 11.8* 11.7* 11.1* 11.8*  HCT 42.6 39.2 36.5 35.3* 34.3* 35.3*  MCV 98.6 97.3 97.1 95.9 96.6 95.7  PLT 216 198 248 237 253 281   Cardiac Enzymes: Recent Labs  Lab 05/21/17 2250 05/22/17 0303 05/22/17 1738  TROPONINI 0.03* 0.03* 0.26*   CBG: Recent Labs  Lab 05/26/17 1123 05/26/17 1638 05/26/17 2135 05/27/17 0747 05/27/17 1204  GLUCAP 93 165* 125* 127* 109*    Medications: . sodium chloride    . sodium chloride    . vancomycin Stopped (05/24/17 1300)   . acetylcysteine  4 mL Nebulization BID  . budesonide (PULMICORT) nebulizer solution  0.25 mg Nebulization BID  . Chlorhexidine Gluconate Cloth  6 each Topical Q0600  . cinacalcet  30 mg Oral Q M,W,F  . citalopram  10 mg Oral BID  . doxercalciferol  1  mcg Intravenous Q M,W,F-HD  . ezetimibe  10 mg Oral Daily  . feeding supplement (NEPRO CARB STEADY)  237 mL Oral BID BM  . ferric citrate  420 mg Oral TID AC  . gabapentin  100 mg Oral QHS  . insulin aspart  0-9 Units Subcutaneous TID WC  . ipratropium  0.5 mg Nebulization TID  . levalbuterol  0.63 mg Nebulization TID  . levothyroxine  100 mcg Oral QAC breakfast  . midodrine  10 mg Oral Q M,W,F-HD  . multivitamin  1 tablet Oral Daily  . mupirocin ointment  1 application Nasal BID  . pantoprazole  40 mg Oral Daily  . polyethylene glycol  17 g Oral Daily  . warfarin  10 mg Oral ONCE-1800  . Warfarin - Pharmacist Dosing Inpatient   Does not apply [phone]

## 2017-05-27 NOTE — Progress Notes (Signed)
SATURATION QUALIFICATIONS: (This note is used to comply with regulatory documentation for home oxygen)  Patient Saturations on Room Air at Rest = 90%  Patient Saturations on Room Air while Ambulating = 86%  Patient Saturations on 2 Liters of oxygen while Ambulating = 92%   

## 2017-05-27 NOTE — Progress Notes (Signed)
HD tx initiated via HD cath w/o problem, pull/push/flush equally w/o problem, VSS w/ soft bp, will cont to monitor while on HD tx

## 2017-05-27 NOTE — Progress Notes (Signed)
ANTICOAGULATION CONSULT NOTE  Pharmacy Consult for Warfarin Indication: chest pain/ACS and atrial fibrillation   Allergies  Allergen Reactions  . Amoxicillin Rash    Has patient had a PCN reaction causing immediate rash, facial/tongue/throat swelling, SOB or lightheadedness with hypotension:YES Has patient had a PCN reaction causing severe rash involving mucus membranes or skin necrosis: Yes Has patient had a PCN reaction that required hospitalization No Has patient had a PCN reaction occurring within the last 10 years: Yes If all of the above answers are "NO", then may proceed with Cephalosporin use.   . Sulfa Drugs Cross Reactors Hives and Itching  . Percocet [Oxycodone-Acetaminophen] Itching and Rash    Did not happen last time she took it  . Sulfa Antibiotics Hives   Labs: Recent Labs    05/25/17 0421 05/26/17 0452 05/27/17 0410  HGB 11.7* 11.1* 11.8*  HCT 35.3* 34.3* 35.3*  PLT 237 253 281  LABPROT 23.0* 16.5* 15.2  INR 2.05 1.35 1.20  CREATININE 4.64* 6.38* 3.88*    Estimated Creatinine Clearance: 13.1 mL/min (A) (by C-G formula based on SCr of 3.88 mg/dL (H)).  Assessment: 77 year old female on warfarin prior to admission for Afib. Warfarin was held for HD access placement- now completed and ok to resume warfarin. Patient is s/p Vitamin K 1 mg 2/21.   INR this morning 1.2. CBC stable No bleeding noted  Home dose 5mg  daily except 7.5mg  on Wednesdays.   Goal of Therapy:  INR 2-3 Monitor platelets by anticoagulation protocol: Yes   Plan:  Warfarin 10mg  PO x1 tonight Daily INR Follow s/s bleeding    Zackarey Holleman L. Kyung Rudd, PharmD, [place] PGY1 Pharmacy Resident Pager: [phone]

## 2017-05-27 NOTE — Care Management Note (Signed)
Case Management Note  Patient Details  Name: [name] MRN: [id number] Date of Birth: 1940/12/30  Subjective/Objective:      Acute Resp Failure             Action/Plan: Home oxygen ordered as requested; Attending MD paged to sign off order; [employer] arranged with [facility] as requested  Expected Discharge Date:    possibly 05/27/2017              Expected Discharge Plan:  [name]  Discharge planning Services  CM Consult  Choice offered to:  Patient  DME Arranged:  3-N-1 DME Agency:  [place]:  RN, Disease Management, PT, Nurse's Aide [place] Agency:  [place]  Status of Service:  In process, will continue to follow  If discussed at Long Length of Stay Meetings, dates discussed:    Additional Comments:  Sherrilyn Rist [phone] 05/27/2017, 4:51 PM

## 2017-05-27 NOTE — Plan of Care (Signed)
  Progressing Pain Managment: General experience of comfort will improve 05/27/2017 0330 - Progressing by Colonel Bald, RN Note Denies c/o pain or discomfort. Clinical Measurements: Ability to maintain clinical measurements within normal limits will improve 05/27/2017 0330 - Progressing by Colonel Bald, RN Note VSS. Respiratory complications will improve 05/27/2017 0330 - Progressing by Colonel Bald, RN Note No s/s of respiratory complications.

## 2017-05-27 NOTE — Progress Notes (Signed)
Physical Therapy Evaluation Patient Details Name: [name] MRN: [id number] DOB: 10-13-1940 Today's Date: 05/27/2017   History of Present Illness  Ms. Canny is a 77 y/o female admitted on 05/21/17 due to persistent SOB and R sided chest pain. Noted acute respiratory failure with hypoxia likely due to pneumonia while in ED. Patient with a PMH significant for ESRD on hemodialysis, AFib, hypothyroidism, DM.   Clinical Impression  Patient admitted with the above listed diagnosis. PTA, patient reports she was Mod I with ADLs and ambulation with use of hemi-walker at home (due to space) and use of rollator in the community. Patient today on 2L O2 via [place] with O2 sat remaining ~93% and bp at 118 bpm. Gait training in hallway on RA with O2 desat to 85%, 123 bpm, and subjective reports of feeling lightheaded. Today requiring Min A for bed mobility for LE management and to pull up from PT. Will benefit from continued PT with PT to continue to follow acutely to maximize functional mobility prior to d/c.    SATURATION QUALIFICATIONS: (This note is used to comply with regulatory documentation for home oxygen)  Patient Saturations on Room Air while Ambulating = 85%  Patient Saturations on 2 Liters of oxygen while Rest = 93%  Please briefly explain why patient needs home oxygen: Patient desat. To below 90% on RA during ambulation. Required stadning rest break ~2 min + heavy VC to raise O2 sat to 90% prior to continuation of gait training. Patient reports of feeling "lightheaded" during gait while on RA.    Follow Up Recommendations Home health PT;Supervision for mobility/OOB    Equipment Recommendations  Rolling walker with 5" wheels    Recommendations for Other Services       Precautions / Restrictions Precautions Precautions: Fall Restrictions Weight Bearing Restrictions: No      Mobility  Bed Mobility Overal bed mobility: Needs Assistance Bed Mobility: Supine to Sit;Sit to Supine      Supine to sit: Min assist Sit to supine: Min assist   General bed mobility comments: Min A for LE management and to pull up on PT to come to EOB  Transfers Overall transfer level: Needs assistance Equipment used: Rolling walker (2 wheeled) Transfers: Sit to/from Stand Sit to Stand: Min assist            Ambulation/Gait Ambulation/Gait assistance: Min guard Ambulation Distance (Feet): 80 Feet Assistive device: Rolling walker (2 wheeled) Gait Pattern/deviations: Step-through pattern;Decreased stride length;Trunk flexed   Gait velocity interpretation: Below normal speed for age/gender General Gait Details: O2 on RA desat to 86% - standing rest break with heavy education for breating raising O2 to 90%  Stairs            Wheelchair Mobility    Modified Rankin (Stroke Patients Only)       Balance Overall balance assessment: Needs assistance Sitting-balance support: Single extremity supported;Feet supported Sitting balance-Leahy Scale: Good     Standing balance support: Bilateral upper extremity supported;During functional activity Standing balance-Leahy Scale: Fair                               Pertinent Vitals/Pain Pain Assessment: No/denies pain    Home Living Family/patient expects to be discharged to:: Private residence Living Arrangements: Children;Other relatives Available Help at Discharge: Family Type of Home: House Home Access: (threshold)     Home Layout: One level Home Equipment: Walker - 4 wheels(hemiwalker)  Prior Function Level of Independence: Independent with assistive device(s)               Hand Dominance        Extremity/Trunk Assessment   Upper Extremity Assessment Upper Extremity Assessment: Defer to OT evaluation    Lower Extremity Assessment Lower Extremity Assessment: Generalized weakness       Communication   Communication: No difficulties  Cognition Arousal/Alertness: Awake/alert Behavior  During Therapy: WFL for tasks assessed/performed Overall Cognitive Status: Within Functional Limits for tasks assessed                                        General Comments      Exercises     Assessment/Plan    PT Assessment Patient needs continued PT services  PT Problem List Decreased strength;Decreased activity tolerance;Decreased balance;Decreased mobility;Decreased knowledge of use of DME;Decreased safety awareness       PT Treatment Interventions DME instruction;Gait training;Functional mobility training;Therapeutic activities;Therapeutic exercise;Balance training;Patient/family education    PT Goals (Current goals can be found in the Care Plan section)  Acute Rehab PT Goals Patient Stated Goal: return home PT Goal Formulation: With patient Time For Goal Achievement: 06/10/17 Potential to Achieve Goals: Good    Frequency Min 3X/week   Barriers to discharge        Co-evaluation               AM-PAC PT "6 Clicks" Daily Activity  Outcome Measure Difficulty turning over in bed (including adjusting bedclothes, sheets and blankets)?: Unable Difficulty moving from lying on back to sitting on the side of the bed? : Unable Difficulty sitting down on and standing up from a chair with arms (e.g., wheelchair, bedside commode, etc,.)?: Unable Help needed moving to and from a bed to chair (including a wheelchair)?: A Little Help needed walking in hospital room?: A Little Help needed climbing 3-5 steps with a railing? : A Lot 6 Click Score: 11    End of Session Equipment Utilized During Treatment: Gait belt Activity Tolerance: Patient tolerated treatment well Patient left: in bed;with call bell/phone within reach Nurse Communication: Mobility status PT Visit Diagnosis: Other abnormalities of gait and mobility (R26.89);Muscle weakness (generalized) (M62.81)    Time: 0175-1025 PT Time Calculation (min) (ACUTE ONLY): 31 min   Charges:   PT  Evaluation $PT Eval Moderate Complexity: 1 Mod PT Treatments $Gait Training: 8-22 mins   PT G Codes:        Lanney Gins, PT, DPT 05/27/17 3:19 PM

## 2017-05-28 LAB — BASIC METABOLIC PANEL
Anion gap: 13 (ref 5–15)
BUN: 40 mg/dL — ABNORMAL HIGH (ref 6–20)
CO2: 26 mmol/L (ref 22–32)
Calcium: 7.8 mg/dL — ABNORMAL LOW (ref 8.9–10.3)
Chloride: 100 mmol/L — ABNORMAL LOW (ref 101–111)
Creatinine, Ser: 5.83 mg/dL — ABNORMAL HIGH (ref 0.44–1.00)
GFR calc Af Amer: 7 mL/min — ABNORMAL LOW (ref >60)
GFR calc non Af Amer: 6 mL/min — ABNORMAL LOW (ref >60)
Glucose, Bld: 85 mg/dL (ref 65–99)
Potassium: 4.2 mmol/L (ref 3.5–5.1)
Sodium: 139 mmol/L (ref 135–145)

## 2017-05-28 LAB — CBC
HCT: 34 % — ABNORMAL LOW (ref 36.0–46.0)
Hemoglobin: 11 g/dL — ABNORMAL LOW (ref 12.0–15.0)
MCH: 31.4 pg (ref 26.0–34.0)
MCHC: 32.4 g/dL (ref 30.0–36.0)
MCV: 97.1 fL (ref 78.0–100.0)
Platelets: 336 10*3/uL (ref 150–400)
RBC: 3.5 MIL/uL — ABNORMAL LOW (ref 3.87–5.11)
RDW: 16.9 % — ABNORMAL HIGH (ref 11.5–15.5)
WBC: 14 10*3/uL — ABNORMAL HIGH (ref 4.0–10.5)

## 2017-05-28 LAB — PROTIME-INR
INR: 1.88
Prothrombin Time: 21.5 seconds — ABNORMAL HIGH (ref 11.4–15.2)

## 2017-05-28 LAB — MAGNESIUM: Magnesium: 2 mg/dL (ref 1.7–2.4)

## 2017-05-28 LAB — GLUCOSE, CAPILLARY: Glucose-Capillary: 93 mg/dL (ref 65–99)

## 2017-05-28 MED ORDER — GABAPENTIN 100 MG PO CAPS: 100.0000 mg | ORAL_CAPSULE | Freq: Every day | ORAL | 0 refills | Status: DC

## 2017-05-28 MED ORDER — WARFARIN SODIUM 7.5 MG PO TABS: 7.5000 mg | ORAL_TABLET | ORAL | Status: DC

## 2017-05-28 MED ORDER — DOXERCALCIFEROL 4 MCG/2ML IV SOLN: 1.0000 ug | INTRAVENOUS | 0 refills | Status: DC

## 2017-05-28 MED ORDER — VANCOMYCIN HCL IN DEXTROSE 1-5 GM/200ML-% IV SOLN: 1000.0000 mg | INTRAVENOUS | 0 refills | Status: DC

## 2017-05-28 MED ORDER — MIDODRINE HCL 10 MG PO TABS: 10.0000 mg | ORAL_TABLET | ORAL | 0 refills | Status: DC

## 2017-05-28 MED ORDER — WARFARIN SODIUM 5 MG PO TABS: 5.0000 mg | ORAL_TABLET | ORAL | Status: DC

## 2017-05-28 MED ORDER — WARFARIN - PHYSICIAN DOSING INPATIENT: Freq: Every day | Status: DC

## 2017-05-28 MED ORDER — ONDANSETRON HCL 4 MG PO TABS: 4.0000 mg | ORAL_TABLET | Freq: Four times a day (QID) | ORAL | 0 refills | Status: DC | PRN

## 2017-05-28 MED ORDER — CINACALCET HCL 30 MG PO TABS: 30.0000 mg | ORAL_TABLET | ORAL | 0 refills | Status: DC

## 2017-05-28 NOTE — Evaluation (Signed)
Occupational Therapy Evaluation and Discharge Patient Details Name: [name] MRN: [id number] DOB: 25-Oct-1940 Today's Date: 05/28/2017    History of Present Illness [name] is a 77 y/o female admitted on 05/21/17 due to persistent SOB and R sided chest pain. Noted acute respiratory failure with hypoxia likely due to pneumonia while in ED. Patient with a PMH significant for ESRD on hemodialysis, AFib, hypothyroidism, DM.    Clinical Impression   PTA Pt mod I for ADL and mobility with DME. Pt is currently min A for transfers, min A for UB ADL and mod A for LB ADL. Pt was able to get dressed with assist from family and OT as well as perform functional transfers. Feel Pt would benefit from 3 in 1 to raise toilet up and due to decreased activity tolerance has option to use as BSC. Pt recommended to sit for bathing. Family willing and able to assist Pt. No questions or concerns for OT at the end of session, education complete. Thank you for the opportunity to serve this patient and their family.     Follow Up Recommendations  Supervision/Assistance - 24 hour    Equipment Recommendations  3 in 1 bedside commode;Tub/shower seat    Recommendations for Other Services       Precautions / Restrictions Precautions Precautions: Fall Restrictions Weight Bearing Restrictions: No      Mobility Bed Mobility Overal bed mobility: Needs Assistance Bed Mobility: Supine to Sit;Sit to Supine     Supine to sit: Min assist Sit to supine: Min assist   General bed mobility comments: Min A for LE management and trunk elevation, assist with bed pad to come EOB  Transfers Overall transfer level: Needs assistance Equipment used: Rolling walker (2 wheeled) Transfers: Sit to/from Stand Sit to Stand: Min assist         General transfer comment: MIn A for boost and balance    Balance Overall balance assessment: Needs assistance Sitting-balance support: Single extremity supported;Feet supported;No  upper extremity supported;Feet unsupported Sitting balance-Leahy Scale: Good     Standing balance support: Bilateral upper extremity supported;During functional activity Standing balance-Leahy Scale: Fair Standing balance comment: reliant on at least 1 UE with external support                           ADL either performed or assessed with clinical judgement   ADL Overall ADL's : Needs assistance/impaired Eating/Feeding: Modified independent   Grooming: Set up   Upper Body Bathing: Minimal assistance;With caregiver independent assisting   Lower Body Bathing: Moderate assistance;With caregiver independent assisting   Upper Body Dressing : Minimal assistance;With caregiver independent assisting;Sitting Upper Body Dressing Details (indicate cue type and reason): EOB, to don top and jacket (sip up the front) Lower Body Dressing: Moderate assistance;+2 for safety/equipment;Sit to/from stand Lower Body Dressing Details (indicate cue type and reason): to don underwear, pants, socks and shoes Toilet Transfer: Minimal assistance;Ambulation;RW   Toileting- Clothing Manipulation and Hygiene: Minimal assistance;Sit to/from stand Toileting - Clothing Manipulation Details (indicate cue type and reason): min A for balance     Functional mobility during ADLs: Minimal assistance;Rolling walker       Vision Patient Visual Report: No change from baseline       Perception     Praxis      Pertinent Vitals/Pain Pain Assessment: No/denies pain     Hand Dominance Right   Extremity/Trunk Assessment Upper Extremity Assessment Upper Extremity Assessment: Generalized weakness  Lower Extremity Assessment Lower Extremity Assessment: Generalized weakness   Cervical / Trunk Assessment Cervical / Trunk Assessment: Kyphotic   Communication Communication Communication: No difficulties   Cognition Arousal/Alertness: Awake/alert Behavior During Therapy: WFL for tasks  assessed/performed Overall Cognitive Status: Within Functional Limits for tasks assessed                                     General Comments  Pt's family present for evaluation    Exercises     Shoulder Instructions      Home Living Family/patient expects to be discharged to:: Private residence Living Arrangements: Children;Other relatives Available Help at Discharge: Family Type of Home: House Home Access: (threshold)     Home Layout: One level     Bathroom Shower/Tub: Teacher, early years/pre: Standard     Home Equipment: Environmental consultant - 4 wheels(hemiwalker)          Prior Functioning/Environment Level of Independence: Independent with assistive device(s)                 OT Problem List:        OT Treatment/Interventions:      OT Goals(Current goals can be found in the care plan section) Acute Rehab OT Goals Patient Stated Goal: return home OT Goal Formulation: With patient/family Time For Goal Achievement: 06/11/17 Potential to Achieve Goals: Good  OT Frequency:     Barriers to D/C:            Co-evaluation              AM-PAC PT "6 Clicks" Daily Activity     Outcome Measure Help from another person eating meals?: None Help from another person taking care of personal grooming?: A Little Help from another person toileting, which includes using toliet, bedpan, or urinal?: A Little Help from another person bathing (including washing, rinsing, drying)?: A Lot Help from another person to put on and taking off regular upper body clothing?: A Little Help from another person to put on and taking off regular lower body clothing?: A Lot 6 Click Score: 17   End of Session Equipment Utilized During Treatment: Rolling walker;Oxygen(3L) Nurse Communication: Mobility status  Activity Tolerance: Patient tolerated treatment well Patient left: in bed;with call bell/phone within reach;with family/visitor present(sitting EOB)                    Time: 0768-0881 OT Time Calculation (min): 49 min Charges:  OT General Charges $OT Visit: 1 Visit OT Evaluation $OT Eval Moderate Complexity: 1 Mod OT Treatments $Self Care/Home Management : 23-37 mins G-Codes:     [name] OTR/L [place] 05/28/2017, 12:35 PM

## 2017-05-28 NOTE — Discharge Summary (Addendum)
Physician Discharge Summary  [name] MWU:[id number] DOB: 05-25-1940 DOA: 05/21/2017  PCP: Dione Housekeeper, MD  Admit date: 05/21/2017 Discharge date: 05/28/2017  Time spent: 35 minutes  Recommendations for Outpatient Follow-up:   Acute Respiratory Failure with Hypoxia/positive MRSA pneumonia -complete 2 week course of IV antibiotics to be given post HD  -budesonide nebulizer BID -ipratropium nebulizerTID -Xopenex nebulizer TID  -flutter valve -Patient meets criteria for home O2. See saturation qualifications below. SATURATION QUALIFICATIONS: (This note is used to comply with regulatory documentation for home oxygen) Patient Saturations on Room Air at Rest =90 % Patient Saturations on Room Air while Ambulating = 86 % Patient Saturations on 2 Liters of oxygen while Ambulating = 92 % -2 L O2 via [place] 24 hours today. Titrate to maintain SPO2 89-93% -Provide Inogen O2 concentrator    Positive MRSA bacteremia - continue current course of antibiotics -2/20 S/P HD cath removed -2/22 S/P TEE; negative vegetation see results below  -2/23 patient will need complete 2 week course of IV antibiotics to be given post HD   Sinus Tachycardia -Most likely secondary to bacteremia/pneumonia.  -TSH normal  -resolved   Chronic paroxysmal atrial fibrillation -currently in NSR Recent Labs  Lab 05/21/17 1750 05/22/17 0303 05/23/17 0349 05/25/17 0421 05/26/17 0452 05/27/17 0410 05/28/17 0507  INR 4.43* 5.30* 4.39* 2.05 1.35 1.20 1.88  -2/22 restart Coumadin per pharmacy -Coumadin 5 mg daily except Wednesday 7.5 mg.  -Recheck INR at HD   Chronic Hypotension  -Midodrin 10 mg with HD    ESRD on  HD M/W/F  -HD per nephrology -2/22 S/P placement of LEFT IJ tunneled HD cath    Hypothyroidism -Synthroid 100 g daily   Diabetes type 2 controlled with complication -10/03/5364 Hemoglobin A1c= 5.4 -2/21 Hemoglobin A1c= 4.5 -Lipid panel within ADA guidelines     Discharge Diagnoses:   Principal Problem:   Acute respiratory failure with hypoxia ([place]) Active Problems:   Hypothyroidism   Anemia   Atrial fibrillation ([facility])   MRSA (methicillin resistant staph aureus) culture positive   Discharge Condition: Stable  Diet recommendation: Renal  Filed Weights   05/26/17 2220 05/27/17 0428 05/28/17 0435  Weight: 215 lb 13.3 oz (97.9 kg) 189 lb 4.8 oz (85.9 kg) 189 lb 3.2 oz (85.8 kg)    History of present illness:  [name] PMHx ESRD on HD M/W/F Paroxysmal  A-Fib , HTN, Hypothyroidism, and DM Type 2     Brought to the ER w/ persistent shortness of breath and right-sided chest pain.  She was started on antibiotics and prednisone on February 11 by her PCP, and also treated w/ Tamiflu   During his hospitalization diagnose for MRSA pneumonia and MRSA bacteremia. Patient had new HD catheter placed. Began on appropriate antibiotic treatment. Patient is required to complete 2 week course of antibiotics which will be administered at HD. Stable for discharge.       Procedures: 2/19 Echocardiogram:Left ventricle moderate  concentric hypertrophy. LVEF= 60% to 65%.  - Left atrium: severely dilated. - Pulmonary arteries: Systolic pressure was within the normal  range. PA peak pressure: 26 mm Hg (S). 2/22 TEE: - Left ventricle: Hypertrophy was noted. -LVEF=55%- 60%.  - Left atrium: moderately dilated. No evidence of  thrombus in the atrial cavity or appendage. - Right atrium: moderately dilated. No evidence of thrombus in the atrial cavity or appendage. - Atrial septum: No defect or patent foramen ovale was identified. Positive atrial septal aneurysm.   Consultations: Nephrology ID IR  Cultures  2/17 Blood Positive MRSA  2/17 Sputum Positive MRSA     Antibiotics Anti-infectives (From admission, onward)   Start     Stop   05/27/17 0016  vancomycin (VANCOCIN) 1-5 GM/200ML-% IVPB    Comments:  Yehuda Savannah   : cabinet override   05/27/17 1229   05/26/17 1208   ceFAZolin (ANCEF) 2-4 GM/100ML-% IVPB    Comments:  Heath Lark, Amy   : cabinet override   05/26/17 1244   05/24/17 1200  vancomycin (VANCOCIN) IVPB 1000 mg/200 mL premix         05/22/17 1800  vancomycin (VANCOCIN) IVPB 1000 mg/200 mL premix     05/23/17 1315   05/22/17 1604  vancomycin (VANCOCIN) 1-5 GM/200ML-% IVPB    Comments:  Ashley Akin   : cabinet override   05/23/17 0414   05/22/17 0800  aztreonam (AZACTAM) 500 mg in dextrose 5 % 50 mL IVPB  Status:  Discontinued     05/22/17 1710   05/21/17 2100  vancomycin (VANCOCIN) IVPB 1000 mg/200 mL premix     05/21/17 2243   05/21/17 1845  aztreonam (AZACTAM) 2 g in sodium chloride 0.9 % 100 mL IVPB     05/21/17 2032   05/21/17 1845  vancomycin (VANCOCIN) IVPB 1000 mg/200 mL premix     05/21/17 2122       Discharge Exam: Vitals:   05/28/17 0435 05/28/17 0752 05/28/17 0854 05/28/17 0859  BP: 120/86 113/89    Pulse: (!) 115 (!) 115    Resp: 18 18    Temp: 98.3 F (36.8 C) 98 F (36.7 C)    TempSrc: Oral Oral    SpO2: 97% 95% 95% 96%  Weight: 189 lb 3.2 oz (85.8 kg)     Height:        General: A/O 4, positive acute respiratory distress Neck:  Negative scars, masses, torticollis, lymphadenopathy, JVD Lungs: positive diffuse rhonchi,  negative crackles, positive mild diffuse expiratory wheeze.  Cardiovascular: Regular rate and rhythm without murmur gallop or rub normal S1 and S2    Discharge Instructions   Allergies as of 05/28/2017      Reactions   Amoxicillin Rash   Has patient had a PCN reaction causing immediate rash, facial/tongue/throat swelling, SOB or lightheadedness with hypotension:YES Has patient had a PCN reaction causing severe rash involving mucus membranes or skin necrosis: Yes Has patient had a PCN reaction that required hospitalization No Has patient had a PCN reaction occurring within the last 10 years: Yes If all of the above answers are "NO", then may proceed with Cephalosporin use.   Sulfa Drugs  Cross Reactors Hives, Itching   Percocet [oxycodone-acetaminophen] Itching, Rash   Did not happen last time she took it   Sulfa Antibiotics Hives      Medication List    STOP taking these medications   levofloxacin 500 MG tablet Commonly known as:  LEVAQUIN   oseltamivir 75 MG capsule Commonly known as:  TAMIFLU   predniSONE 20 MG tablet Commonly known as:  DELTASONE     TAKE these medications   ACCU-CHEK AVIVA PLUS test strip Generic drug:  glucose blood   albuterol 108 (90 Base) MCG/ACT inhaler Commonly known as:  PROVENTIL HFA;VENTOLIN HFA Inhale 2 puffs into the lungs every 6 (six) hours as needed for wheezing or shortness of breath.   AURYXIA 1 GM 210 MG(Fe) tablet Generic drug:  ferric citrate Take 2 tablets by mouth 3 (three) times daily before meals.  budesonide-formoterol 80-4.5 MCG/ACT inhaler Commonly known as:  SYMBICORT Inhale 2 puffs into the lungs 2 (two) times daily.   cinacalcet 30 MG tablet Commonly known as:  SENSIPAR Take 1 tablet (30 mg total) by mouth every Monday, Wednesday, and Friday. What changed:  when to take this   citalopram 10 MG tablet Commonly known as:  CELEXA Take 10 mg by mouth 2 (two) times daily.   colchicine 0.6 MG tablet Take 1 tablet (0.6 mg total) by mouth every 3 (three) days. Dose needs to be decreased to twice a week in dialysis patient.   doxercalciferol 4 MCG/2ML injection Commonly known as:  HECTOROL Inject 0.5 mLs (1 mcg total) into the vein every Monday, Wednesday, and Friday with hemodialysis. Start taking on:  05/29/2017   ezetimibe 10 MG tablet Commonly known as:  ZETIA Take 10 mg by mouth daily.   gabapentin 100 MG capsule Commonly known as:  NEURONTIN Take 1 capsule (100 mg total) by mouth at bedtime.   HYDROcodone-acetaminophen 5-325 MG tablet Commonly known as:  NORCO Take 1 tablet by mouth every 6 (six) hours as needed for moderate pain.   levothyroxine 100 MCG tablet Commonly known as:   SYNTHROID, LEVOTHROID Take 100 mcg by mouth daily.   LORazepam 0.5 MG tablet Commonly known as:  ATIVAN TAKE ONE TABLET BY MOUTH EVERY 6 HOURS AS NEEDED FOR ANXIETY   midodrine 10 MG tablet Commonly known as:  PROAMATINE Take 1 tablet (10 mg total) by mouth every Monday, Wednesday, and Friday with hemodialysis. Start taking on:  05/29/2017 What changed:  when to take this   multivitamin Tabs tablet Take 1 tablet by mouth daily.   ondansetron 4 MG tablet Commonly known as:  ZOFRAN Take 1 tablet (4 mg total) by mouth every 6 (six) hours as needed for nausea.   pantoprazole 40 MG tablet Commonly known as:  PROTONIX TAKE ONE TABLET BY MOUTH EVERY DAY 30-60MINUTES BEFORE FIRST MEAL OF THE DAY   vancomycin 1-5 GM/200ML-% Soln Commonly known as:  VANCOCIN Inject 200 mLs (1,000 mg total) into the vein every Monday, Wednesday, and Friday with hemodialysis.   Vitamin D (Ergocalciferol) 50000 units Caps capsule Commonly known as:  DRISDOL Take 50,000 Units by mouth every Monday.   warfarin 5 MG tablet Commonly known as:  COUMADIN Take 5-7.5 mg by mouth as directed. 5 mg on all days except on Wednesday pt takes 7.5 mg            Durable Medical Equipment  (From admission, onward)        Start     Ordered   05/28/17 0744  For home use only DME oxygen  Once    Comments:  Patient meets criteria for home O2. See saturation qualifications below. SATURATION QUALIFICATIONS: (This note is used to comply with regulatory documentation for home oxygen) Patient Saturations on Room Air at Rest =90 % Patient Saturations on Room Air while Ambulating = 86 % Patient Saturations on 2 Liters of oxygen while Ambulating = 92 % -2 L O2 via [place] 24 hours today. Titrate to maintain SPO2 89-93% -Provide Inogen O2 concentrator  Question Answer Comment  Mode or (Route) Nasal cannula   Frequency Continuous (stationary and portable oxygen unit needed)   Oxygen conserving device Yes   Oxygen delivery  system Gas      05/28/17 0743   05/27/17 1623  For home use only DME oxygen  Once    Question Answer Comment  Mode or (Route)  Nasal cannula   Liters per Minute 2   Frequency Continuous (stationary and portable oxygen unit needed)   Oxygen conserving device No   Oxygen delivery system Gas      05/27/17 1623     Allergies  Allergen Reactions  . Amoxicillin Rash    Has patient had a PCN reaction causing immediate rash, facial/tongue/throat swelling, SOB or lightheadedness with hypotension:YES Has patient had a PCN reaction causing severe rash involving mucus membranes or skin necrosis: Yes Has patient had a PCN reaction that required hospitalization No Has patient had a PCN reaction occurring within the last 10 years: Yes If all of the above answers are "NO", then may proceed with Cephalosporin use.   . Sulfa Drugs Cross Reactors Hives and Itching  . Percocet [Oxycodone-Acetaminophen] Itching and Rash    Did not happen last time she took it  . Sulfa Antibiotics Hives   Follow-up Information    [facility]   Specialty:  [place] Why:  they will do your home health care at your home Contact information: [address] [place] [address] [phone]            The results of significant diagnostics from this hospitalization (including imaging, microbiology, ancillary and laboratory) are listed below for reference.    Significant Diagnostic Studies: Ct Angio Chest Pe W Or Wo Contrast  Result Date: 05/22/2017 CLINICAL DATA:  Respiratory distress with tachycardia right-sided chest pain EXAM: CT ANGIOGRAPHY CHEST WITH CONTRAST TECHNIQUE: Multidetector CT imaging of the chest was performed using the standard protocol during bolus administration of intravenous contrast. Multiplanar CT image reconstructions and MIPs were obtained to evaluate the vascular anatomy. CONTRAST:  184m ISOVUE-370 IOPAMIDOL (ISOVUE-370) INJECTION 76%  COMPARISON:  Radiograph 05/21/2017 FINDINGS: Cardiovascular: Satisfactory opacification of the pulmonary arteries to the segmental level. No evidence of pulmonary embolism. Upper lobe evaluation is limited by streak artifact from metal in the shoulders, particularly on the right side. Nonaneurysmal aorta. Moderate aortic atherosclerosis. Coronary artery calcification. Mild cardiomegaly. No significant pericardial effusion. Mediastinum/Nodes: Midline trachea. No thyroid mass. Enlarged mediastinal lymph nodes, prevascular lymph node measures 11 mm. Subcarinal lymph node measures 16 mm.Esophagus within normal limits. Lungs/Pleura: Small right-sided pleural effusion and trace left pleural effusion. Partial consolidation within the right greater than left lung bases. Partial consolidation in the right middle lobe. Negative for a pneumothorax. Upper Abdomen: No acute abnormality. Musculoskeletal: Mild kyphosis. Degenerative changes. No acute or suspicious lesion Review of the MIP images confirms the above findings. IMPRESSION: 1. Suboptimal evaluation of the upper lobe vessels due to streak artifact from metallic hardware in the shoulders. No definite acute central embolus is seen 2. Small right greater than left pleural effusion. Partial consolidation within the bilateral lower lobes and the right middle lobe may reflect atelectasis or pneumonia 3. Enlarged mediastinal lymph nodes, possibly reactive 4. Cardiomegaly Aortic Atherosclerosis (ICD10-I70.0). Electronically Signed   By: KDonavan FoilM.D.   On: 05/22/2017 00:57   Ir Fluoro Guide Cv Line Right  Result Date: 05/26/2017 INDICATION: 77year old female with end-stage renal disease on hemodialysis. She was recently admitted with influenza and MRSA pneumonia requiring removal of her existing right IJ tunneled hemodialysis catheter. She has now had a line holiday and is suitable for replacement. We will proceed with placement of a left IJ approach tunneled  hemodialysis catheter. EXAM: TUNNELED CENTRAL VENOUS HEMODIALYSIS CATHETER PLACEMENT WITH ULTRASOUND AND FLUOROSCOPIC GUIDANCE MEDICATIONS: In patient currently on IV antibiotic coverage. No additional antibiotic  prophylaxis administered. ANESTHESIA/SEDATION: Moderate (conscious) sedation was employed during this procedure. A total of Versed 1 mg and Fentanyl 50 mcg was administered intravenously. Moderate Sedation Time: 15 minutes. The patient's level of consciousness and vital signs were monitored continuously by radiology nursing throughout the procedure under my direct supervision. FLUOROSCOPY TIME:  Fluoroscopy Time: 0 minutes 24 seconds (3 mGy). COMPLICATIONS: None immediate. PROCEDURE: Informed written consent was obtained from the patient after a discussion of the risks, benefits, and alternatives to treatment. Questions regarding the procedure were encouraged and answered. The left neck and chest were prepped with chlorhexidine in a sterile fashion, and a sterile drape was applied covering the operative field. Maximum barrier sterile technique with sterile gowns and gloves were used for the procedure. A timeout was performed prior to the initiation of the procedure. After creating a small venotomy incision, a micropuncture kit was utilized to access the left internal jugular vein under direct, real-time ultrasound guidance after the overlying soft tissues were anesthetized with 1% lidocaine with epinephrine. Ultrasound image documentation was performed. The microwire was kinked to measure appropriate catheter length. A stiff Glidewire was advanced to the level of the IVC and the micropuncture sheath was exchanged for a peel-away sheath. A palindrome tunneled hemodialysis catheter measuring 23 cm from tip to cuff was tunneled in a retrograde fashion from the anterior chest wall to the venotomy incision. The catheter was then placed through the peel-away sheath with tips ultimately positioned within the  superior aspect of the right atrium. Final catheter positioning was confirmed and documented with a spot radiographic image. The catheter aspirates and flushes normally. The catheter was flushed with appropriate volume heparin dwells. The catheter exit site was secured with a 0-Prolene retention suture. The venotomy incision was closed with Dermabond. Dressings were applied. The patient tolerated the procedure well without immediate post procedural complication. IMPRESSION: Successful placement of 23 cm tip to cuff tunneled hemodialysis catheter via the left internal jugular vein with tips terminating within the superior aspect of the right atrium. The catheter is ready for immediate use. Signed, Criselda Peaches, MD [facility] Radiology Electronically Signed   By: Jacqulynn Cadet M.D.   On: 05/26/2017 15:16   Ir Removal Tun Cv Cath W/o Fl  Result Date: 05/24/2017 INDICATION: History of MRSA bacteremia. Patient has a 89-year-old tunneled hemodialysis catheter. Unsure of where was placed or exactly when it was placed. Due to her bacteremia, a request has been made for removal of her HD catheter. EXAM: REMOVAL TUNNELED CENTRAL VENOUS CATHETER MEDICATIONS: 1% lidocaine with epinephrine; ANESTHESIA/SEDATION: None FLUOROSCOPY TIME:  Fluoroscopy Time: 0 minutes 0 seconds (0 mGy). COMPLICATIONS: None immediate. PROCEDURE: Informed written consent was obtained from the patient after a thorough discussion of the procedural risks, benefits and alternatives. All questions were addressed. Maximal Sterile Barrier Technique was utilized including caps, mask, sterile gowns, sterile gloves, sterile drape, hand hygiene and skin antiseptic. A timeout was performed prior to the initiation of the procedure. The patient's right chest and catheter was prepped and draped in a normal sterile fashion. Heparin was removed from both ports of catheter. 1% lidocaine with epinephrine was  used for local anesthesia. Using gentle blunt and sharp dissection the cuff of the catheter was exposed and the catheter was removed in it's entirety. Pressure was held till hemostasis was obtained. A sterile dressing was applied. The patient tolerated the procedure well with no immediate complications. IMPRESSION: Successful catheter removal as described above. Read by: Saverio Danker,  PA-C Electronically Signed   By: Markus Daft M.D.   On: 05/24/2017 15:24   Ir US Guide Vasc Access Right  Result Date: 05/26/2017 INDICATION: 77 year old female with end-stage renal disease on hemodialysis. She was recently admitted with influenza and MRSA pneumonia requiring removal of her existing right IJ tunneled hemodialysis catheter. She has now had a line holiday and is suitable for replacement. We will proceed with placement of a left IJ approach tunneled hemodialysis catheter. EXAM: TUNNELED CENTRAL VENOUS HEMODIALYSIS CATHETER PLACEMENT WITH ULTRASOUND AND FLUOROSCOPIC GUIDANCE MEDICATIONS: In patient currently on IV antibiotic coverage. No additional antibiotic prophylaxis administered. ANESTHESIA/SEDATION: Moderate (conscious) sedation was employed during this procedure. A total of Versed 1 mg and Fentanyl 50 mcg was administered intravenously. Moderate Sedation Time: 15 minutes. The patient's level of consciousness and vital signs were monitored continuously by radiology nursing throughout the procedure under my direct supervision. FLUOROSCOPY TIME:  Fluoroscopy Time: 0 minutes 24 seconds (3 mGy). COMPLICATIONS: None immediate. PROCEDURE: Informed written consent was obtained from the patient after a discussion of the risks, benefits, and alternatives to treatment. Questions regarding the procedure were encouraged and answered. The left neck and chest were prepped with chlorhexidine in a sterile fashion, and a sterile drape was applied covering the operative field. Maximum barrier sterile technique with sterile gowns  and gloves were used for the procedure. A timeout was performed prior to the initiation of the procedure. After creating a small venotomy incision, a micropuncture kit was utilized to access the left internal jugular vein under direct, real-time ultrasound guidance after the overlying soft tissues were anesthetized with 1% lidocaine with epinephrine. Ultrasound image documentation was performed. The microwire was kinked to measure appropriate catheter length. A stiff Glidewire was advanced to the level of the IVC and the micropuncture sheath was exchanged for a peel-away sheath. A palindrome tunneled hemodialysis catheter measuring 23 cm from tip to cuff was tunneled in a retrograde fashion from the anterior chest wall to the venotomy incision. The catheter was then placed through the peel-away sheath with tips ultimately positioned within the superior aspect of the right atrium. Final catheter positioning was confirmed and documented with a spot radiographic image. The catheter aspirates and flushes normally. The catheter was flushed with appropriate volume heparin dwells. The catheter exit site was secured with a 0-Prolene retention suture. The venotomy incision was closed with Dermabond. Dressings were applied. The patient tolerated the procedure well without immediate post procedural complication. IMPRESSION: Successful placement of 23 cm tip to cuff tunneled hemodialysis catheter via the left internal jugular vein with tips terminating within the superior aspect of the right atrium. The catheter is ready for immediate use. Signed, Criselda Peaches, MD [facility] Radiology Electronically Signed   By: Jacqulynn Cadet M.D.   On: 05/26/2017 15:16   Dg Chest Port 1 View  Result Date: 05/21/2017 CLINICAL DATA:  Cough EXAM: PORTABLE CHEST 1 VIEW COMPARISON:  04/13/2016 chest radiograph. FINDINGS: Bilateral total shoulder arthroplasty is partially visualized.  Right internal jugular central venous catheter terminates at the cavoatrial junction. Stable cardiomediastinal silhouette with mild cardiomegaly. No pneumothorax. No pleural effusion. No pulmonary edema. Low lung volumes with mild bibasilar atelectasis. IMPRESSION: Low lung volumes with mild bibasilar atelectasis. Stable cardiomegaly without pulmonary edema. Electronically Signed   By: Ilona Sorrel M.D.   On: 05/21/2017 19:20    Microbiology: Recent Results (from the past 240 hour(s))  Blood Culture (routine x 2)     Status: Abnormal  Collection Time: 05/21/17  6:41 PM  Result Value Ref Range Status   Specimen Description BLOOD RIGHT FOREARM  Final   Special Requests   Final    BOTTLES DRAWN AEROBIC AND ANAEROBIC Blood Culture adequate volume   Culture  Setup Time   Final    GRAM POSITIVE COCCI ANAEROBIC BOTTLE ONLY CRITICAL RESULT CALLED TO, READ BACK BY AND VERIFIED WITHEzekiel Slocumb PHARMD 1508 05/22/17 A BROWNING Performed at [facility], [place] [address] [place], [place] [address]    Culture METHICILLIN RESISTANT STAPHYLOCOCCUS AUREUS (A)  Final   Report Status 05/24/2017 FINAL  Final   Organism ID, Bacteria METHICILLIN RESISTANT STAPHYLOCOCCUS AUREUS  Final      Susceptibility   Methicillin resistant staphylococcus aureus - MIC*    CIPROFLOXACIN >=8 RESISTANT Resistant     ERYTHROMYCIN >=8 RESISTANT Resistant     GENTAMICIN <=0.5 SENSITIVE Sensitive     OXACILLIN >=4 RESISTANT Resistant     TETRACYCLINE <=1 SENSITIVE Sensitive     VANCOMYCIN <=0.5 SENSITIVE Sensitive     TRIMETH/SULFA <=10 SENSITIVE Sensitive     CLINDAMYCIN <=0.25 SENSITIVE Sensitive     RIFAMPIN <=0.5 SENSITIVE Sensitive     Inducible Clindamycin NEGATIVE Sensitive     * METHICILLIN RESISTANT STAPHYLOCOCCUS AUREUS  Blood Culture ID Panel (Reflexed)     Status: Abnormal   Collection Time: 05/21/17  6:41 PM  Result Value Ref Range Status   Enterococcus species NOT DETECTED NOT DETECTED Final   Listeria  monocytogenes NOT DETECTED NOT DETECTED Final   Staphylococcus species DETECTED (A) NOT DETECTED Final    Comment: CRITICAL RESULT CALLED TO, READ BACK BY AND VERIFIED WITH: E SINCLAIR PHARMD 1508 05/22/17 A BROWNING    Staphylococcus aureus DETECTED (A) NOT DETECTED Final    Comment: Methicillin (oxacillin)-resistant Staphylococcus aureus (MRSA). MRSA is predictably resistant to beta-lactam antibiotics (except ceftaroline). Preferred therapy is vancomycin unless clinically contraindicated. Patient requires contact precautions if  hospitalized. CRITICAL RESULT CALLED TO, READ BACK BY AND VERIFIED WITH: E SINCLAIR PHARMD 1508 05/22/17 A BROWNING    Methicillin resistance DETECTED (A) NOT DETECTED Final    Comment: CRITICAL RESULT CALLED TO, READ BACK BY AND VERIFIED WITH: E SINCLAIR PHARMD 1508 05/22/17 A BROWNING    Streptococcus species NOT DETECTED NOT DETECTED Final   Streptococcus agalactiae NOT DETECTED NOT DETECTED Final   Streptococcus pneumoniae NOT DETECTED NOT DETECTED Final   Streptococcus pyogenes NOT DETECTED NOT DETECTED Final   Acinetobacter baumannii NOT DETECTED NOT DETECTED Final   Enterobacteriaceae species NOT DETECTED NOT DETECTED Final   Enterobacter cloacae complex NOT DETECTED NOT DETECTED Final   Escherichia coli NOT DETECTED NOT DETECTED Final   Klebsiella oxytoca NOT DETECTED NOT DETECTED Final   Klebsiella pneumoniae NOT DETECTED NOT DETECTED Final   Proteus species NOT DETECTED NOT DETECTED Final   Serratia marcescens NOT DETECTED NOT DETECTED Final   Haemophilus influenzae NOT DETECTED NOT DETECTED Final   Neisseria meningitidis NOT DETECTED NOT DETECTED Final   Pseudomonas aeruginosa NOT DETECTED NOT DETECTED Final   Candida albicans NOT DETECTED NOT DETECTED Final   Candida glabrata NOT DETECTED NOT DETECTED Final   Candida krusei NOT DETECTED NOT DETECTED Final   Candida parapsilosis NOT DETECTED NOT DETECTED Final   Candida tropicalis NOT DETECTED NOT  DETECTED Final    Comment: Performed at [facility], [place]. [address] [place], [place] [address]  Blood Culture (routine x 2)     Status: None   Collection  Time: 05/21/17  6:46 PM  Result Value Ref Range Status   Specimen Description BLOOD LEFT ANTECUBITAL  Final   Special Requests   Final    BOTTLES DRAWN AEROBIC AND ANAEROBIC Blood Culture adequate volume   Culture   Final    NO GROWTH 5 DAYS Performed at [facility], [address] [place], [place] [address]    Report Status 05/26/2017 FINAL  Final  Culture, expectorated sputum-assessment     Status: None   Collection Time: 05/21/17  7:03 PM  Result Value Ref Range Status   Specimen Description SPUTUM  Final   Special Requests NONE  Final   Sputum evaluation   Final    THIS SPECIMEN IS ACCEPTABLE FOR SPUTUM CULTURE Performed at [facility], [place] [address], [place], [place] [address]    Report Status 05/22/2017 FINAL  Final  Culture, respiratory (NON-Expectorated)     Status: None   Collection Time: 05/21/17  7:03 PM  Result Value Ref Range Status   Specimen Description SPUTUM  Final   Special Requests NONE Reflexed from [phone]  Final   Gram Stain   Final    FEW WBC PRESENT, PREDOMINANTLY PMN ABUNDANT GRAM POSITIVE COCCI IN CLUSTERS MODERATE GRAM NEGATIVE RODS FEW YEAST Performed at [facility], [address], [place], [place] [address]    Culture   Final    ABUNDANT METHICILLIN RESISTANT STAPHYLOCOCCUS AUREUS   Report Status 05/24/2017 FINAL  Final   Organism ID, Bacteria METHICILLIN RESISTANT STAPHYLOCOCCUS AUREUS  Final      Susceptibility   Methicillin resistant staphylococcus aureus - MIC*    CIPROFLOXACIN >=8 RESISTANT Resistant     ERYTHROMYCIN >=8 RESISTANT Resistant     GENTAMICIN <=0.5 SENSITIVE Sensitive     OXACILLIN >=4 RESISTANT Resistant     TETRACYCLINE <=1 SENSITIVE Sensitive     VANCOMYCIN <=0.5 SENSITIVE Sensitive     TRIMETH/SULFA <=10 SENSITIVE Sensitive      CLINDAMYCIN <=0.25 SENSITIVE Sensitive     RIFAMPIN <=0.5 SENSITIVE Sensitive     Inducible Clindamycin NEGATIVE Sensitive     * ABUNDANT METHICILLIN RESISTANT STAPHYLOCOCCUS AUREUS  MRSA PCR Screening     Status: Abnormal   Collection Time: 05/24/17  7:28 PM  Result Value Ref Range Status   MRSA by PCR POSITIVE (A) NEGATIVE Final    Comment:        The GeneXpert MRSA Assay (FDA approved for NASAL specimens only), is one component of a comprehensive MRSA colonization surveillance program. It is not intended to diagnose MRSA infection nor to guide or monitor treatment for MRSA infections. RESULT CALLED TO, READ BACK BY AND VERIFIED WITH: D.RODGERS AT 0157 05/25/17 BY L.PITT   Culture, blood (routine x 2)     Status: None (Preliminary result)   Collection Time: 05/24/17  7:35 PM  Result Value Ref Range Status   Specimen Description BLOOD RIGHT ARM  Final   Special Requests   Final    BOTTLES DRAWN AEROBIC AND ANAEROBIC Blood Culture adequate volume   Culture   Final    NO GROWTH 3 DAYS Performed at [facility], [address] [place], [place] [address]    Report Status PENDING  Incomplete  Culture, blood (routine x 2)     Status: None (Preliminary result)   Collection Time: 05/24/17  7:40 PM  Result Value Ref Range Status   Specimen Description BLOOD LEFT ARM  Final   Special Requests  Final    BOTTLES DRAWN AEROBIC AND ANAEROBIC Blood Culture adequate volume   Culture   Final    NO GROWTH 3 DAYS Performed at [facility], [place] [address] [place], [place] [address]    Report Status PENDING  Incomplete  Culture, blood (Routine X 2) w Reflex to ID Panel     Status: None (Preliminary result)   Collection Time: 05/25/17 10:05 AM  Result Value Ref Range Status   Specimen Description BLOOD LEFT HAND  Final   Special Requests   Final    Blood Culture adequate volume BOTTLES DRAWN AEROBIC AND ANAEROBIC   Culture   Final    NO GROWTH 2 DAYS Performed at [facility], [place] [address], [place], [place] [address]    Report Status PENDING  Incomplete  Culture, blood (Routine X 2) w Reflex to ID Panel     Status: None (Preliminary result)   Collection Time: 05/25/17 10:15 AM  Result Value Ref Range Status   Specimen Description BLOOD LEFT HAND  Final   Special Requests   Final    Blood Culture adequate volume BOTTLES DRAWN AEROBIC AND ANAEROBIC   Culture   Final    NO GROWTH 2 DAYS Performed at [facility], [place] [address] [place], [place] [address]    Report Status PENDING  Incomplete     Labs: Basic Metabolic Panel: Recent Labs  Lab 05/24/17 0830 05/25/17 0421 05/26/17 0452 05/27/17 0410 05/28/17 0507  NA 140 139 140 139 139  K 4.2 4.1 4.0 4.4 4.2  CL 101 99* 100* 100* 100*  CO2 '23 28 25 26 26  ' GLUCOSE 95 117* 108* 135* 85  BUN 53* 29* 50* 23* 40*  CREATININE 7.16* 4.64* 6.38* 3.88* 5.83*  CALCIUM 7.5* 7.7* 7.7* 7.9* 7.8*  MG  --  1.9 1.8 1.9 2.0  PHOS 3.0  --   --   --   --    Liver Function Tests: Recent Labs  Lab 05/21/17 1750 05/24/17 0830  AST 24  --   ALT 20  --   ALKPHOS 103  --   BILITOT 1.0  --   PROT 6.0*  --   ALBUMIN 3.1* 2.2*   No results for input(s): LIPASE, AMYLASE in the last 168 hours. No results for input(s): AMMONIA in the last 168 hours. CBC: Recent Labs  Lab 05/21/17 1750  05/24/17 0830 05/25/17 0421 05/26/17 0452 05/27/17 0410 05/28/17 0507  WBC 19.6*   < > 19.3* 19.7* 17.2* 16.6* 14.0*  NEUTROABS 17.3*  --   --   --   --   --   --   HGB 13.6   < > 11.8* 11.7* 11.1* 11.8* 11.0*  HCT 42.6   < > 36.5 35.3* 34.3* 35.3* 34.0*  MCV 98.6   < > 97.1 95.9 96.6 95.7 97.1  PLT 216   < > 248 237 253 281 336   < > = values in this interval not displayed.   Cardiac Enzymes: Recent Labs  Lab 05/21/17 2250 05/22/17 0303 05/22/17 1738  TROPONINI 0.03* 0.03* 0.26*   BNP: BNP (last 3 results) Recent Labs    05/21/17 1750  BNP 667.9*    ProBNP (last 3 results) No results for  input(s): PROBNP in the last 8760 hours.  CBG: Recent Labs  Lab 05/27/17 0747 05/27/17 1204 05/27/17 1653 05/27/17 2115 05/28/17 0751  GLUCAP 127* 109* 105* 93 93  Signed:  Dia Crawford, MD [facility] [phone] pager

## 2017-05-28 NOTE — Progress Notes (Signed)
Subjective:  No c/o, going home today , up walking in room w/ min assist  Objective Vital signs in last 24 hours: Vitals:   05/28/17 0435 05/28/17 0752 05/28/17 0854 05/28/17 0859  BP: 120/86 113/89    Pulse: (!) 115 (!) 115    Resp: 18 18    Temp: 98.3 F (36.8 C) 98 F (36.7 C)    TempSrc: Oral Oral    SpO2: 97% 95% 95% 96%  Weight: 85.8 kg (189 lb 3.2 oz)     Height:       Weight change: -3.084 kg (-12.8 oz)  Physical Exam: General: Alert , obese, elderly WF NAD Heart:  RRR, ,no mur, rub, gal Lungs: scattered rhonchi Abdomen: obese, soft, NT, ND  Extremities:no pedal edema  Dialysis Access: L IJ P. Cath      OP Dialysis Orders:[facility] MWF 4 hrs 160 NRe 350/800 manual 89 Kg 2.0 K/2.5 Ca UFP 4 linear sodium -No heparin -Mircera 100 mcg IV q 2 weeks (last dose 05/08/17 HGB 12.7 05/17/17) -Venofer 50 mg IV weekly (last dose 05/15/17) -Hectorol 1 mcg IV TIW   Problem/Plan: 1. MRSA PNA/ bacteremia - IV abx, TDC removed and replaced. TEE neg. Improved. Up and OOB. Per primary.  2. ESRD - cont MWF HD 3. Hypertension/volume - under dry, on Midodrine 10tid 4. Anemia ckd- HGB 11.7No ESA 5. Metabolic bone disease - continue binders, VDRA, sensipar. 6. Nutrition - Albumin 2.2 in the setting of sepsis.Renal/Carb. Renal vit nepro 7. H/O afib on coumadin 8. DM: per primary 9. Dispo: stable, if dc'd let us know if she will need IV abx at OP HD   Kelly Splinter MD [place] pager [phone]   05/28/2017, 9:47 AM    Labs: Basic Metabolic Panel: Recent Labs  Lab 05/24/17 0830  05/26/17 0452 05/27/17 0410 05/28/17 0507  NA 140   < > 140 139 139  K 4.2   < > 4.0 4.4 4.2  CL 101   < > 100* 100* 100*  CO2 23   < > 25 26 26   GLUCOSE 95   < > 108* 135* 85  BUN 53*   < > 50* 23* 40*  CREATININE 7.16*   < > 6.38* 3.88* 5.83*  CALCIUM 7.5*   < > 7.7* 7.9* 7.8*  PHOS 3.0  --   --   --   --    < > = values in this interval not displayed.    Liver Function Tests: Recent Labs  Lab 05/21/17 1750 05/24/17 0830  AST 24  --   ALT 20  --   ALKPHOS 103  --   BILITOT 1.0  --   PROT 6.0*  --   ALBUMIN 3.1* 2.2*   CBC: Recent Labs  Lab 05/21/17 1750  05/24/17 0830 05/25/17 0421 05/26/17 0452 05/27/17 0410 05/28/17 0507  WBC 19.6*   < > 19.3* 19.7* 17.2* 16.6* 14.0*  NEUTROABS 17.3*  --   --   --   --   --   --   HGB 13.6   < > 11.8* 11.7* 11.1* 11.8* 11.0*  HCT 42.6   < > 36.5 35.3* 34.3* 35.3* 34.0*  MCV 98.6   < > 97.1 95.9 96.6 95.7 97.1  PLT 216   < > 248 237 253 281 336   < > = values in this interval not displayed.   Cardiac Enzymes: Recent Labs  Lab 05/21/17 2250 05/22/17 0303 05/22/17 1738  TROPONINI 0.03*  0.03* 0.26*   CBG: Recent Labs  Lab 05/27/17 0747 05/27/17 1204 05/27/17 1653 05/27/17 2115 05/28/17 0751  GLUCAP 127* 109* 105* 93 93    Medications: . sodium chloride    . sodium chloride    . vancomycin Stopped (05/24/17 1300)   . acetylcysteine  4 mL Nebulization BID  . budesonide (PULMICORT) nebulizer solution  0.25 mg Nebulization BID  . Chlorhexidine Gluconate Cloth  6 each Topical Q0600  . cinacalcet  30 mg Oral Q M,W,F  . citalopram  10 mg Oral BID  . doxercalciferol  1 mcg Intravenous Q M,W,F-HD  . ezetimibe  10 mg Oral Daily  . feeding supplement (NEPRO CARB STEADY)  237 mL Oral BID BM  . ferric citrate  420 mg Oral TID AC  . gabapentin  100 mg Oral QHS  . insulin aspart  0-9 Units Subcutaneous TID WC  . ipratropium  0.5 mg Nebulization TID  . levalbuterol  0.63 mg Nebulization TID  . levothyroxine  100 mcg Oral QAC breakfast  . midodrine  10 mg Oral Q M,W,F-HD  . multivitamin  1 tablet Oral Daily  . mupirocin ointment  1 application Nasal BID  . pantoprazole  40 mg Oral Daily  . polyethylene glycol  17 g Oral Daily  . warfarin  5 mg Oral Once per day on Sun Mon Tue Thu Fri Sat   And  . [START ON 05/31/2017] warfarin  7.5 mg Oral Once per day on Wed  . Warfarin -  Physician Dosing Inpatient   Does not apply [phone]

## 2017-05-28 NOTE — Discharge Instructions (Signed)
Chronic Kidney Disease, Adult Chronic kidney disease (CKD) happens when the kidneys are damaged during a time of 3 or more months. The kidneys are two organs that do many important jobs in the body. These jobs include:  Removing wastes and extra fluids from the blood.  Making hormones that maintain the amount of fluid in your tissues and blood vessels.  Making sure that the body has the right amount of fluids and chemicals.  Most of the time, this condition does not go away, but it can usually be controlled. Steps must be taken to slow down the kidney damage or stop it from getting worse. Otherwise, the kidneys may stop working. Follow these instructions at home:  Follow your diet as told by your doctor. You may need to avoid alcohol, salty foods (sodium), and foods that are high in potassium, calcium, and protein.  Take over-the-counter and prescription medicines only as told by your doctor. Do not take any new medicines unless your doctor says you can do that. These include vitamins and minerals. ? Medicines and nutritional supplements can make kidney damage worse. ? Your doctor may need to change how much medicine you take.  Do not use any tobacco products. These include cigarettes, chewing tobacco, and e-cigarettes. If you need help quitting, ask your doctor.  Keep all follow-up visits as told by your doctor. This is important.  Check your blood pressure. Tell your doctor if there are changes to your blood pressure.  Get to a healthy weight. Stay at that weight. If you need help with this, ask your doctor.  Start or continue an exercise plan. Try to exercise at least 30 minutes a day, 5 days a week.  Stay up-to-date with your shots (immunizations) as told by your doctor. Contact a doctor if:  Your symptoms get worse.  You have new symptoms. Get help right away if:  You have symptoms of end-stage kidney disease. These include: ? Headaches. ? Skin that is darker or lighter  than normal. ? Numbness in your hands or feet. ? Easy bruising. ? Having hiccups often. ? Chest pain. ? Shortness of breath. ? Stopping of menstrual periods in women.  You have a fever.  You are making very little pee (urine).  You have pain or bleeding when you pee (urinate). This information is not intended to replace advice given to you by your health care provider. Make sure you discuss any questions you have with your health care provider. Document Released: 06/15/2009 Document Revised: 08/27/2015 Document Reviewed: 11/18/2011 Elsevier Interactive Patient Education  2017 [place].   Dialysis Diet Dialysis is a treatment that cleans your blood. It is used when the kidneys are damaged. When you need dialysis, you should watch your diet. This is because some nutrients can build up in your blood between treatments and make you sick. These nutrients are:  Potassium.  Phosphorus.  Sodium.  Your doctor or dietitian will:  Tell you how much of these you can have.  Tell you if you need to look out for other nutrients too.  Help you plan meals.  Tell you how much to drink each day.  What do I need to know about this diet?  Limit potassium. Potassium is in milk, fruits, and vegetables.  Limit phosphorus. Phosphorus is in milk, cheese, beans, nuts, and carbonated beverages.  Limit salt (sodium). Foods that have a lot sodium include processed and cured meats, ready-made frozen meals, canned vegetables, and salty snack foods.  Do not use  salt substitutes.  Try not to eat whole-grain foods and foods that have a lot of fiber.  Follow your doctor's instructions about how much to drink. You may be told to: ? Write down what you drink. ? Write down foods you eat that are made mostly from water, such as gelatin and soups. ? Drink from small cups.  Ask your doctor if you should take a medicine that binds phosphorus.  Take vitamin and mineral supplements only as told by  your doctor.  Eat meat, poultry, fish, and eggs. Limit nuts and beans.  Before you cook potatoes, cut them into small pieces. Then boil them in unsalted water.  Drain all fluid from cooked vegetables and canned fruits before you eat them. What foods can I eat? Grains White bread. White rice. Cooked cereal. Unsalted popcorn. Tortillas. Pasta. Vegetables Fresh or frozen broccoli, carrots, and green beans. Cabbage. Cauliflower. Celery. Cucumbers. Eggplant. Radishes. Zucchini. Fruits Apples. Fresh or frozen berries. Fresh or canned pears, peaches, and pineapple. Grapes. Plums. Meats and Other Protein Sources Fresh or frozen beef, pork, chicken, and fish. Eggs. Low-sodium canned tuna or salmon. Dairy Cream cheese. Heavy cream. Ricotta cheese. Beverages Apple cider. Cranberry juice. Grape juice. Lemonade. Black coffee. Condiments Herbs. Spices. Jam and jelly. Honey. Sweets and Desserts Sherbet. Cakes. Cookies. Fats and Oils Olive oil, canola oil, and safflower oil. Other Non-dairy creamer. Non-dairy whipped topping. Homemade broth without salt. The items listed above may not be a complete list of recommended foods or beverages. Contact your dietitian for more options. What foods are not recommended? Grains Whole-grain bread. Whole-grain pasta. High-fiber cereal. Vegetables Potatoes. Beets. Tomatoes. Winter squash and pumpkin. Asparagus. Spinach. Parsnips. Fruits Star fruit. Bananas. Oranges. Kiwi. Nectarines. Prunes. Melon. Dried fruit. Avocado. Meats and Other Protein Sources Canned, smoked, and cured meats. Soil scientist. Sardines. Nuts and seeds. Peanut butter. Beans and legumes. Dairy Milk. Buttermilk. Yogurt. Cheese and cottage cheese. Processed cheese spreads. Beverages Orange juice. Prune juice. Carbonated soft drinks. Condiments Salt. Salt substitutes. Soy sauce. Sweets and Desserts Ice cream. Chocolate. Candied nuts. Fats and Oils Butter.  Margarine. Other Ready-made frozen meals. Canned soups. The items listed above may not be a complete list of foods and beverages to avoid. Contact your dietitian for more information. This information is not intended to replace advice given to you by your health care provider. Make sure you discuss any questions you have with your health care provider. Document Released: 09/20/2011 Document Revised: 08/27/2015 Document Reviewed: 10/22/2013 Elsevier Interactive Patient Education  2018 [employer].   Dialysis Dialysis is a procedure that replaces some of the work healthy kidneys do. It is done when you lose about 85-90% of your kidney function. It may also be done earlier if your symptoms may be improved by dialysis. During dialysis, wastes, salt, and extra water are removed from the blood, and the levels of certain chemicals in the blood (such as potassium) are maintained. Dialysis is done in sessions. Dialysis sessions are continued until the kidneys get better. If the kidneys cannot get better, such as in end-stage kidney disease, dialysis is continued for life or until you receive a new kidney (kidney transplant). There are two types of dialysis: hemodialysis and peritoneal dialysis. What is hemodialysis? Hemodialysis is a type of dialysis in which a machine called a dialyzer is used to filter the blood. Before beginning hemodialysis, you will have surgery to create a site where blood can be removed from the body and returned to the body (vascular access).  There are three types of vascular accesses:  Arteriovenous fistula. To create this type of access, an artery is connected to a vein (usually in the arm). A fistula takes 1-6 months to develop after surgery. If it develops properly, it usually lasts longer than the other types of vascular accesses. It is also less likely to become infected and cause blood clots.  Arteriovenous graft. To create this type of access, an artery and a vein in the arm  are connected with a tube. A graft may be used within 2-3 weeks of surgery.  A venous catheter. To create this type of access, a thin, flexible tube (catheter) is placed in a large vein in your neck, chest, or groin. A catheter may be used right away. It is usually used as a temporary access when dialysis needs to begin immediately.  During hemodialysis, blood leaves the body through your access. It travels through a tube to the dialyzer, where it is filtered. The blood then returns to your body through another tube. Hemodialysis is usually performed by a health care provider at a hospital or dialysis center three times a week. Visits last about 3-4 hours. It may also be performed with the help of another person at home with training. What is peritoneal dialysis? Peritoneal dialysis is a type of dialysis in which the thin lining of the abdomen (peritoneum) is used as a filter. Before beginning peritoneal dialysis, you will have surgery to place a catheter in your abdomen. The catheter will be used to transfer a fluid called dialysate to and from your abdomen. At the start of a session, your abdomen is filled with dialysate. During the session, wastes, salt, and extra water in the blood pass through the peritoneum and into the dialysate. The dialysate is drained from the body at the end of the session. The process of filling and draining the dialysate is called an exchange. Exchanges are repeated until you have used up all the dialysate for the day. Peritoneal dialysis may be performed by you at home or at almost any other location. It is done every day. You may need up to five exchanges a day. The amount of time the dialysate is in your body between exchanges is called a dwell. The dwell depends on the number of exchanges needed and the characteristics of the peritoneum. It usually varies from 1.5-3 hours. You may go about your day normally between exchanges. Alternately, the exchanges may be done at night  while you sleep, using a machine called a cycler. Which type of dialysis should I choose? Both hemodialysis and peritoneal dialysis have advantages and disadvantages. Talk to your health care provider about which type of dialysis would be best for you. Your lifestyle and preferences should be considered along with your medical condition. In some cases, only one type of dialysis may be an option. Advantages of hemodialysis  It is done less often than peritoneal dialysis.  Someone else can do the dialysis for you.  If you go to a dialysis center, your health care provider will be able to recognize any problems right away.  If you go to a dialysis center, you can interact with others who are having dialysis. This can provide you with emotional support.  Disadvantages of hemodialysis  Hemodialysis may cause cramps and low blood pressure. It may leave you feeling tired on the days you have the treatment.  If you go to a dialysis center, you will need to make weekly appointments and work around  the centers schedule.  You will need to take extra care when traveling. If you go to a dialysis center, you will need to make special arrangements to visit a dialysis center near your destination. If you are having treatments at home, you will need to take the dialyzer with you to your destination.  You will need to avoid more foods than you would need to avoid on peritoneal dialysis.  Advantages of peritoneal dialysis  It is less likely than hemodialysis to cause cramps and low blood pressure.  You may do exchanges on your own wherever you are, including when you travel.  You do not need to avoid as many foods as you do on hemodialysis.  Disadvantages of peritoneal dialysis  It is done more often than hemodialysis.  Performing peritoneal dialysis requires you to have dexterity of the hands. You must also be able to lift bags.  You will have to learn sterilization techniques. You will need to  practice them every day to reduce the risk of infection.  What changes will I need to make to my diet during dialysis? Both hemodialysis and peritoneal dialysis require you to make some changes to your diet. For example, you will need to limit your intake of foods high in the minerals phosphorus and potassium. You will also need to limit your fluid intake. Your dietitian can help you plan meals. A good meal plan can improve your dialysis and your health. What should I expect when beginning dialysis? Adjusting to the dialysis treatment, schedule, and diet can take some time. You may need to stop working and may not be able to do some of the things you normally do. You may feel anxious or depressed when beginning dialysis. Eventually, many people feel better overall because of dialysis. Some people are able to return to work after making some changes, such as reducing work intensity. Where can I find more information?  [place]: www.kidney.org  American Association of Kidney Patients: BombTimer.gl  American Kidney Fund: www.kidneyfund.org This information is not intended to replace advice given to you by your health care provider. Make sure you discuss any questions you have with your health care provider. Document Released: 06/11/2002 Document Revised: 08/27/2015 Document Reviewed: 05/15/2012 Elsevier Interactive Patient Education  2017 [place] Kidney Disease End-stage kidney disease occurs when the kidneys are so damaged that they cannot do their job. The kidneys are two organs that do many important jobs in the body, which include:  Removing wastes and extra fluids from the blood.  Making hormones that maintain the amount of fluid in your tissues and blood vessels.  Maintaining the right amount of fluids and chemicals in the body.  When the kidneys are damaged and cannot do their job, life-threatening problems occur. Without the help of the kidneys,  toxins build up in the blood. In end-stage kidney disease, the kidneys cannot get better. What are the causes? End-stage kidney disease usually occurs when a long-lasting (chronic) kidney disease gets worse. It may also occur after the kidneys are suddenly damaged (acute kidney injury). What increases the risk? This condition is more likely to develop in people who are:  Older than age 68.  Female.  Of African-American descent.  Current smokers or former smokers.  Obese.  You may also have an increased risk for end-stage kidney disease if you:  Have a family history of chronic kidney disease (CKD).  Have had kidney disease for many years.  Have other longstanding medical  conditions that affect the kidneys, such as: ? Cardiovascular disease including high blood pressure. ? Diabetes. ? Certain diseases that affect the immune system.  What are the signs or symptoms?  Swelling (edema) of the face, legs, ankles, or feet.  Numbness, tingling, or loss of feeling (sensation) in your hands or feet.  Tiredness (lethargy).  Nausea or vomiting.  Confusion, trouble concentrating, or loss of consciousness.  Chest pain.  Shortness of breath.  Little to no urine production.  Muscle twitches and cramps, especially in the legs.  Constant itchiness.  Loss of appetite.  Pale skin and tissue lining your eyelids (conjunctiva).  Headaches.  Abnormally dark or light skin.  Decrease in muscle size (muscle wasting).  Easy bruising.  Frequent hiccups.  Stopping of menstruation in women.  Seizures. How is this diagnosed? Your health care provider will measure your blood pressure and do some tests. These may include:  Urine tests.  Blood tests.  Imaging tests.  A test in which a sample of tissue is removed from the kidneys to be looked at under a microscope (kidney biopsy).  How is this treated? There are two treatments for end-stage kidney disease:  A procedure that  removes toxic wastes from the body (dialysis). Depending on the type of dialysis you choose, it may be performed more than one time a day (peritoneal dialysis) or several times a week (hemodialysis).  Surgery toreceive a new kidney (kidney transplant).  In addition to having dialysis or a kidney transplant, you may need to take medicines:  To control high blood pressure (hypertension).  To control cholesterol.  To maintain healthy electrolyte levels in your blood.  You may also be given a specific diet to follow that includes requirements or limits for:  Salt (sodium).  Protein.  Phosphorous.  Potassium.  Calcium.  Follow these instructions at home:  Follow your prescribed diet.  Take over-the-counter and prescription medicines only as told by your health care provider. ? Do not take any new medicines unless approved by your health care provider. Many medicines can worsen your kidney damage. ? Do not take any vitamin and mineral supplements unless approved by your health care provider. Many nutritional supplements can worsen your kidney damage. ? The dose of some medicines that you take may need to be adjusted.  Do not use any tobacco products, such as cigarettes, chewing tobacco, and e-cigarettes. If you need help quitting, ask your health care provider.  Keep all follow-up visits as told by your health care provider. This is important.  Keep track of your blood pressure. Report changes in your blood pressure as told by your health care provider.  Achieve and maintain a healthy weight. If you need help with this, ask your health care provider.  Start or continue an exercise plan. Try to exercise at least 30 minutes a day, 5 days a week.  Stay current with immunizations as told by your health care provider. Where to find more information:  American Association of Kidney Patients: BombTimer.gl  National Kidney Foundation: www.kidney.[place]:  https://mathis.com/  [facility]: www.lifeoptions.org and www.kidneyschool.org Contact a health care provider if:  Your symptoms get worse.  You develop new symptoms. Get help right away if:  You have weakness in an arm or leg on one side of your body.  You have difficulty speaking or you are slurring your speech.  You have a sudden change in your vision.  You have a sudden, severe headache.  You  have a sudden weight increase.  You have difficulty breathing.  Your symptoms suddenly get worse. This information is not intended to replace advice given to you by your health care provider. Make sure you discuss any questions you have with your health care provider. Document Released: 06/11/2003 Document Revised: 08/27/2015 Document Reviewed: 11/18/2011 Elsevier Interactive Patient Education  2017 [place] about MRSA (Methicillin-Resistant Staphylococcus aureus) What is MRSA? Staphylococcus aureus (pronounced staff-ill-oh-KOK-us AW-ree-us), or "Staph" is a very common germ that about 1 out of every 3 people have on their skin or in their nose. This germ does not cause any problems for most people who have it on their skin. But sometimes it can cause serious infections such as skin or wound infections, pneumonia, or infections of the blood. Antibiotics are given to kill Staph germs when they cause infections. Some Staph are resistant, meaning they cannot be killed by some antibiotics. "Methicillin-resistant Staphylococcus aureus" or "MRSA" is a type of Staph that is resistant to some of the antibiotics that are often used to treat Staph infections. Who is most likely to get an MRSA infection? In the hospital, people who are more likely to get an MRSA infection are people who:  have other health conditions making them sick  have been in the hospital or a nursing home  have been treated with antibiotics.  People who are healthy and who have not been in  the hospital or a nursing home can also get MRSA infections. These infections usually involve the skin. More information about this type of MRSA infection, known as "community-associated MRSA" infection, is available from the Centers for Disease Control and Prevention (CDC). http://rios.biz/ How do I get an MRSA infection? People who have MRSA germs on their skin or who are infected with MRSA may be able to spread the germ to other people. MRSA can be passed on to bed linens, bed rails, bathroom fixtures, and medical equipment. It can spread to other people on contaminated equipment and on the hands of doctors, nurses, other healthcare providers and visitors. Can MRSA infections be treated? Yes, there are antibiotics that can kill MRSA germs. Some patients with MRSA abscesses may need surgery to drain the infection. Your healthcare provider will determine which treatments are best for you. What are some of the things that hospitals are doing to prevent MRSA infections? To prevent MRSA infections, doctors, nurses and other healthcare providers:  Clean their hands with soap and water or an alcohol-based hand rub before and after caring for every patient.  Carefully clean hospital rooms and medical equipment.  Use Contact Precautions when caring for patients with MRSA. Contact Precautions mean: ? Whenever possible, patients with MRSA will have a single room or will share a room only with someone else who also has MRSA. ? Healthcare providers will put on gloves and wear a gown over their clothing while taking care of patients with MRSA. ? Visitors may also be asked to wear a gown and gloves. ? When leaving the room, hospital providers and visitors remove their gown and gloves and clean their hands. ? Patients on Contact Precautions are asked to stay in their hospital rooms as much as possible. They should not go to common areas, such as the gift shop or cafeteria. They may go to other areas of  the hospital for treatments and tests.  May test some patients to see if they have MRSA on their skin. This test involves rubbing a cotton-tipped swab in the patient's  nostrils or on the skin.  What can I do to help prevent MRSA infections? In the hospital  Make sure that all doctors, nurses, and other healthcare providers clean their hands with soap and water or an alcohol-based hand rub before and after caring for you.  If you do not see your providers clean their hands, please ask them to do so. When you go home  If you have wounds or an intravascular device (such as a catheter or dialysis port) make sure that you know how to take care of them.  Can my friends and family get MRSA when they visit me? The chance of getting MRSA while visiting a person who has MRSA is very low. To decrease the chance of getting MRSA your family and friends should:  Clean their hands before they enter your room and when they leave.  Ask a healthcare provider if they need to wear protective gowns and gloves when they visit you.  What do I need to do when I go home from the hospital? To prevent another MRSA infection and to prevent spreading MRSA to others:  Keep taking any antibiotics prescribed by your doctor. Don't take half-doses or stop before you complete your prescribed course.  Clean your hands often, especially before and after changing your wound dressing or bandage.  People who live with you should clean their hands often as well.  Keep any wounds clean and change bandages as instructed until healed.  Avoid sharing personal items such as towels or razors.  Wash and dry your clothes and bed linens in the warmest temperatures recommended on the labels.  Tell your healthcare providers that you have MRSA. This includes home health nurses and aides, therapists, and personnel in doctors' offices.  Your doctor may have more instructions for you.  If you have questions, please ask your doctor  or nurse. Co-sponsored by The Society for [place] 214-718-3088); Infectious Diseases Society of [employer] (IDSA); [place]; Association for Professionals in Infection Control and Epidemiology (APIC); Centers for Disease Control and Prevention (CDC); and [employer]. This information is not intended to replace advice given to you by your health care provider. Make sure you discuss any questions you have with your health care provider. Document Released: 03/26/2013 Document Revised: 08/27/2015 Document Reviewed: 06/04/2014 Elsevier Interactive Patient Education  [employer].

## 2017-05-29 LAB — CULTURE, BLOOD (ROUTINE X 2)
Culture: NO GROWTH
Culture: NO GROWTH
Special Requests: ADEQUATE
Special Requests: ADEQUATE

## 2017-05-30 LAB — CULTURE, BLOOD (ROUTINE X 2)
Culture: NO GROWTH
Culture: NO GROWTH
Special Requests: ADEQUATE
Special Requests: ADEQUATE

## 2017-05-30 NOTE — Progress Notes (Addendum)
HD tx initiated via HD cath w/o problem, pull/push/flush equally w/o problem, VSS, will cont to monitor while on HD tx  Correction: the date for initiation of HD tx was 2/22, not 2/23

## 2017-05-30 NOTE — Progress Notes (Signed)
HD tx completed @ 0225 w/o problem, UF goal met, blood rinsed back, report called to Deborah Duvall, RN 

## 2017-06-01 DIAGNOSIS — Z22322 Carrier or suspected carrier of Methicillin resistant Staphylococcus aureus: Secondary | ICD-10-CM

## 2017-06-01 HISTORY — DX: Carrier or suspected carrier of methicillin resistant Staphylococcus aureus: Z22.322

## 2017-06-02 DIAGNOSIS — E440 Moderate protein-calorie malnutrition: Secondary | ICD-10-CM | POA: Insufficient documentation

## 2017-06-05 DIAGNOSIS — D72829 Elevated white blood cell count, unspecified: Secondary | ICD-10-CM | POA: Insufficient documentation

## 2017-06-08 ENCOUNTER — Telehealth: Payer: Self-pay | Admitting: Cardiology

## 2017-06-08 NOTE — Telephone Encounter (Signed)
Sounds like she will need follow up in the office.  Can be with APP.

## 2017-06-08 NOTE — Telephone Encounter (Signed)
Pt's dtr calling regarding pt having an increase of Afib, she also states she was told Dr.Hochrein needs to monitor her oxygen and that maybe she could come off -pls advise [phone]

## 2017-06-08 NOTE — Telephone Encounter (Signed)
Spoke with daughter. Patient recently hospitalized with pneumonia and sepsis. She was sent home on oxygen and HHN advised to follow up with cardiologist regarding need for oxygen. Per daughter she is doing better but feels like she needs to continue with oxygen. She does have post hospital follow up scheduled. Advised to contact PCP regarding oxygen. In regards to the Afib it was worse while in hospital but doing better.

## 2017-06-08 NOTE — Telephone Encounter (Signed)
Left message to call back  

## 2017-06-09 NOTE — Telephone Encounter (Signed)
Scheduled appointment with Doreene Burke PA next week

## 2017-06-15 ENCOUNTER — Ambulatory Visit (INDEPENDENT_AMBULATORY_CARE_PROVIDER_SITE_OTHER): Payer: Medicare Other | Admitting: Cardiology

## 2017-06-15 ENCOUNTER — Encounter: Payer: Self-pay | Admitting: Cardiology

## 2017-06-15 DIAGNOSIS — Z992 Dependence on renal dialysis: Secondary | ICD-10-CM | POA: Diagnosis not present

## 2017-06-15 DIAGNOSIS — J9601 Acute respiratory failure with hypoxia: Secondary | ICD-10-CM | POA: Diagnosis not present

## 2017-06-15 DIAGNOSIS — Z7901 Long term (current) use of anticoagulants: Secondary | ICD-10-CM

## 2017-06-15 DIAGNOSIS — N186 End stage renal disease: Secondary | ICD-10-CM | POA: Diagnosis not present

## 2017-06-15 DIAGNOSIS — I480 Paroxysmal atrial fibrillation: Secondary | ICD-10-CM

## 2017-06-15 NOTE — Assessment & Plan Note (Signed)
HD MWF-Dr Lorrene Reid follows

## 2017-06-15 NOTE — Assessment & Plan Note (Signed)
Know PAF for years- B/P has been too low to add beta blocker or Ca++  Blocker She had PAF during her recent hospitalization but is in NSR today

## 2017-06-15 NOTE — Patient Instructions (Signed)
Medication Instructions:  Your physician recommends that you continue on your current medications as directed. Please refer to the Current Medication list given to you today.  Labwork: None   Testing/Procedures: None   Follow-Up: Keep upcoming appointment with Dr Percival Spanish as scheduled  Any Other Special Instructions Will Be Listed Below (If Applicable).  PER LUKE KILROY, PA-C YOU TAN TAKE OFF OXYGEN WHILE AT REST.  If you need a refill on your cardiac medications before your next appointment, please call your pharmacy.

## 2017-06-15 NOTE — Assessment & Plan Note (Signed)
Coumadin for PAF- CHADS VASC=4

## 2017-06-15 NOTE — Progress Notes (Signed)
06/15/2017 [name]   June 17, 1940  [id number]  Primary Physician Dione Housekeeper, MD Primary Cardiologist: Dr Percival Spanish  HPI:  77 y/o female with COPD, ESRD on HD, DM, and PAF-on Couamdin. She was recently in the hospital with MRSA bacteremia. TEE done shoed normal LVF with LVH, no vegetation. She is in the office today for follow up. Review of her telemetry during her hospitalization shows she did have PAF with RVR but she is in NSR today. In reviewing her records it appears she has always had problems with hypotension on dialysis and she has not recently been on a Ca++ blocker or beta blocker for rate control. Since discharge her daughter says she has done pretty well, she is frail with COPD on O2, and she is not very active at home.  Current Outpatient Medications  Medication Sig Dispense Refill  . ACCU-CHEK AVIVA PLUS test strip     . albuterol (PROVENTIL HFA;VENTOLIN HFA) 108 (90 Base) MCG/ACT inhaler Inhale 2 puffs into the lungs every 6 (six) hours as needed for wheezing or shortness of breath.    . budesonide-formoterol (SYMBICORT) 80-4.5 MCG/ACT inhaler Inhale 2 puffs into the lungs 2 (two) times daily. 1 Inhaler 12  . cinacalcet (SENSIPAR) 30 MG tablet Take 1 tablet (30 mg total) by mouth every Monday, Wednesday, and Friday. 60 tablet 0  . citalopram (CELEXA) 40 MG tablet Take 40 mg by mouth daily.     . colchicine 0.6 MG tablet Take 1 tablet (0.6 mg total) by mouth every 3 (three) days. Dose needs to be decreased to twice a week in dialysis patient.    Marland Kitchen doxercalciferol (HECTOROL) 4 MCG/2ML injection Inject 0.5 mLs (1 mcg total) into the vein every Monday, Wednesday, and Friday with hemodialysis. 2 mL 0  . ezetimibe (ZETIA) 10 MG tablet Take 10 mg by mouth daily.     . Ferric Citrate (AURYXIA) 1 GM 210 MG(Fe) TABS Take 2 tablets by mouth 3 (three) times daily before meals.    . gabapentin (NEURONTIN) 100 MG capsule Take 1 capsule (100 mg total) by mouth at bedtime. 30 capsule 0    . HYDROcodone-acetaminophen (NORCO) 5-325 MG tablet Take 1 tablet by mouth every 6 (six) hours as needed for moderate pain. 30 tablet 0  . levothyroxine (SYNTHROID, LEVOTHROID) 100 MCG tablet Take 100 mcg by mouth daily.      Marland Kitchen LORazepam (ATIVAN) 0.5 MG tablet TAKE ONE TABLET BY MOUTH EVERY 6 HOURS AS NEEDED FOR ANXIETY    . midodrine (PROAMATINE) 10 MG tablet Take 1 tablet (10 mg total) by mouth every Monday, Wednesday, and Friday with hemodialysis. 10 tablet 0  . multivitamin (RENA-VIT) TABS tablet Take 1 tablet by mouth daily.    . ondansetron (ZOFRAN) 4 MG tablet Take 1 tablet (4 mg total) by mouth every 6 (six) hours as needed for nausea. 20 tablet 0  . pantoprazole (PROTONIX) 40 MG tablet TAKE ONE TABLET BY MOUTH EVERY DAY 30-60MINUTES BEFORE FIRST MEAL OF THE DAY 30 tablet 2  . Vitamin D, Ergocalciferol, (DRISDOL) 50000 UNITS CAPS Take 50,000 Units by mouth every Monday.     . warfarin (COUMADIN) 5 MG tablet Take 5-7.5 mg by mouth as directed. 5 mg on all days except on Wednesday pt takes 7.5 mg     No current facility-administered medications for this visit.     Allergies  Allergen Reactions  . Amoxicillin Rash    Has patient had a PCN reaction causing immediate rash, facial/tongue/throat  swelling, SOB or lightheadedness with hypotension:YES Has patient had a PCN reaction causing severe rash involving mucus membranes or skin necrosis: Yes Has patient had a PCN reaction that required hospitalization No Has patient had a PCN reaction occurring within the last 10 years: Yes If all of the above answers are "NO", then may proceed with Cephalosporin use.   . Sulfa Drugs Cross Reactors Hives and Itching  . Percocet [Oxycodone-Acetaminophen] Itching and Rash    Did not happen last time she took it  . Sulfa Antibiotics Hives    Past Medical History:  Diagnosis Date  . A-fib ([place])   . Acute respiratory failure ([place]) 05/2017  . Anemia   . Arthritis   . Diabetes mellitus    "diet  controlled"  . Diabetes mellitus without complication ([place])   . ESRD (end stage renal disease) ([place])    dialysis MWF  . GERD (gastroesophageal reflux disease)   . Gout   . History of blood transfusion   . HLD (hyperlipidemia)   . HOH (hard of hearing)    left ear  . Hypertension    hypotensive -since starting dialysis  . Hypothyroidism   . PAF (paroxysmal atrial fibrillation) ([place])    a. Echo 11/16:  Mild LVH, EF 55-60%, normal wall motion, MAC, mild MR, severe LAE (49 ml/m2), mild RVE, normal RVSF, mild RAE, mild TR, PASP 24 mmHg;  CHADS2-VASc: 4 >> Coumadin followed by PCP  . Renal disorder     Social History   Socioeconomic History  . Marital status: Widowed    Spouse name: Not on file  . Number of children: 6  . Years of education: Not on file  . Highest education level: Not on file  Social Needs  . Financial resource strain: Not on file  . Food insecurity - worry: Not on file  . Food insecurity - inability: Not on file  . Transportation needs - medical: Not on file  . Transportation needs - non-medical: Not on file  Occupational History  . Not on file  Tobacco Use  . Smoking status: Former Smoker    Packs/day: 0.25    Years: 2.00    Pack years: 0.50    Types: Cigarettes    Last attempt to quit: 04/04/1998    Years since quitting: 19.2  . Smokeless tobacco: Never Used  Substance and Sexual Activity  . Alcohol use: No    Alcohol/week: 0.0 oz  . Drug use: No  . Sexual activity: Not on file  Other Topics Concern  . Not on file  Social History Narrative   ** Merged History Encounter **       Widowed 6 children Lives with son and daughter in law homemaker     Family History  Problem Relation Age of Onset  . Diabetes Father        before age 4  . Heart disease Father   . Diabetes Sister   . Cancer Brother   . Hyperlipidemia Daughter   . Hypertension Daughter   . Hypertension Son   . Lung cancer Sister   . Cancer Sister      Review of  Systems: General: negative for chills, fever, night sweats or weight changes.  Cardiovascular: negative for chest pain,  edema, orthopnea, palpitations Dermatological: negative for rash Respiratory: negative for cough or wheezing Urologic: negative for hematuria Abdominal: negative for nausea, vomiting, diarrhea, bright red blood per rectum, melena, or hematemesis Neurologic: negative for visual changes, syncope, or dizziness All  other systems reviewed and are otherwise negative except as noted above.    Blood pressure 116/68, pulse 90, height _0  (1.549 m), weight 193 lb (87.5 kg), SpO2 92 %.  General appearance: alert, cooperative, no distress, moderately obese and on O2, using a walker Lungs: decreased breath sounds, kyphoscoliosis, no wheezing Heart: regular rate and rhythm and decreased heart sounds Skin: cool dry, areas of ecchymosis Neurologic: Grossly normal  EKG irregular baseline makes it difficult to see P waves but R-R is regular and QRS normal.   ASSESSMENT AND PLAN:   Acute respiratory failure with hypoxia [facility]) Hospitalized Feb 2019 with MRSA bacteremia now on home O2  PAF (paroxysmal atrial fibrillation) ([place]) Know PAF for years- B/P has been too low to add beta blocker or Ca++  Blocker She had PAF during her recent hospitalization but is in NSR today  ESRD (end stage renal disease) on dialysis ([facility]) HD MWF-Dr Lorrene Reid follows  Chronic anticoagulation Coumadin for PAF- CHADS VASC=4   PLAN  Fortunately she is in NSR. If she has recurrent AF with RVR consider low dose Amiodarone. Keep f/u with Dr [facility] in May.  Her O2 sat on RA after 5 mon was 92. I suggested she use her O2 with activity and at night, she can probably take it of when at rest.   Kerin Ransom PA-C 06/15/2017 9:19 AM

## 2017-06-15 NOTE — Assessment & Plan Note (Signed)
Hospitalized Feb 2019 with MRSA bacteremia now on home O2

## 2017-07-11 ENCOUNTER — Ambulatory Visit (HOSPITAL_COMMUNITY)
Admission: RE | Admit: 2017-07-11 | Discharge: 2017-07-11 | Disposition: A | Discharge status: Home / Self Care | Payer: Medicare Other | Source: Ambulatory Visit | Attending: Adult Health Nurse Practitioner | Admitting: Adult Health Nurse Practitioner

## 2017-07-11 ENCOUNTER — Other Ambulatory Visit (HOSPITAL_COMMUNITY): Payer: Self-pay | Admitting: Adult Health Nurse Practitioner

## 2017-07-11 DIAGNOSIS — J189 Pneumonia, unspecified organism: Secondary | ICD-10-CM | POA: Insufficient documentation

## 2017-07-11 DIAGNOSIS — R918 Other nonspecific abnormal finding of lung field: Secondary | ICD-10-CM | POA: Insufficient documentation

## 2017-07-26 ENCOUNTER — Encounter: Payer: Self-pay | Admitting: Cardiology

## 2017-08-08 NOTE — Progress Notes (Signed)
Cardiology Office Note   Date:  08/09/2017   ID:  DAHIANA KULAK, DOB 07-30-1940, MRN [id number]  PCP:  Dione Housekeeper, MD  Cardiologist:   Minus Breeding, MD   Chief Complaint  Patient presents with  . PAF      History of Present Illness: [name] is a 77 y.o. female who presents for follow-up of atrial fibrillation.  She was recently in the hospital with MRSA bacteremia. TEE done showed normal LVF with LVH, no vegetation.  Since that time she has had no acute complaints.  She was sent home on 2 L of oxygen and is now on 3 L.  She ambulates slowly with a walker.  She tolerates dialysis but she always has low blood pressure.  They can do ultrafiltration but not hemodialysis because of the low blood pressures.  She takes the midodrine only on dialysis days.  Her blood pressure is low always.    She has not had syncope.  She denies any chest pressure, neck or arm discomfort.  She does have dizziness at times.     Past Medical History:  Diagnosis Date  . A-fib ([place])   . Acute respiratory failure ([place]) 05/2017  . Anemia   . Arthritis   . Diabetes mellitus    "diet controlled"  . Diabetes mellitus without complication ([place])   . ESRD (end stage renal disease) ([place])    dialysis MWF  . GERD (gastroesophageal reflux disease)   . Gout   . History of blood transfusion   . HLD (hyperlipidemia)   . HOH (hard of hearing)    left ear  . Hypertension    hypotensive -since starting dialysis  . Hypothyroidism   . PAF (paroxysmal atrial fibrillation) ([place])    a. Echo 11/16:  Mild LVH, EF 55-60%, normal wall motion, MAC, mild MR, severe LAE (49 ml/m2), mild RVE, normal RVSF, mild RAE, mild TR, PASP 24 mmHg;  CHADS2-VASc: 4 >> Coumadin followed by PCP  . Renal disorder     Past Surgical History:  Procedure Laterality Date  . AV FISTULA PLACEMENT  04/03/2012   Procedure: ARTERIOVENOUS (AV) FISTULA CREATION;  Surgeon: Elam Dutch, MD;  Location: [facility];  Service: Vascular;   Laterality: Left;  creation left brachial cephalic fistula   . CARPAL TUNNEL RELEASE Left   . COLONOSCOPY W/ POLYPECTOMY    . DILATION AND CURETTAGE OF UTERUS    . EYE SURGERY Bilateral    bilateral cataract removal  . Hemodialysis  catheter Right   . IR FLUORO GUIDE CV LINE RIGHT  05/26/2017  . IR REMOVAL TUN CV CATH W/O FL  05/24/2017  . IR US GUIDE VASC ACCESS RIGHT  05/26/2017  . JOINT REPLACEMENT Bilateral    bilateral knee  . JOINT REPLACEMENT Right    shoulder  . LIGATION OF ARTERIOVENOUS  FISTULA Left 02/04/2015   Procedure: LIGATION OF BRACHIOCEPHALIC ARTERIOVENOUS  FISTULA;  Surgeon: Conrad [place], MD;  Location: [facility];  Service: Vascular;  Laterality: Left;  . PORTACATH PLACEMENT    . REVERSE SHOULDER ARTHROPLASTY Left 01/22/2016   Procedure: LEFT REVERSE SHOULDER ARTHROPLASTY;  Surgeon: Netta Cedars, MD;  Location: [facility];  Service: Orthopedics;  Laterality: Left;  . SPLIT NIGHT STUDY  07/26/2015  . STERIOD INJECTION Right 01/22/2016   Procedure: RIGHT RING FINGER STEROID INJECTION;  Surgeon: Netta Cedars, MD;  Location: [facility];  Service: Orthopedics;  Laterality: Right;  . TEE WITHOUT CARDIOVERSION N/A 05/26/2017  Procedure: TRANSESOPHAGEAL ECHOCARDIOGRAM (TEE);  Surgeon: Lelon Perla, MD;  Location: [facility];  Service: Cardiovascular;  Laterality: N/A;  . THROMBECTOMY BRACHIAL ARTERY Left 02/06/2015   Procedure: EVACUATION OF LEFT ARM HEMATOMA;  Surgeon: Angelia Mould, MD;  Location: [facility];  Service: Vascular;  Laterality: Left;  . TOTAL KNEE ARTHROPLASTY     right knee  . TUBAL LIGATION       Current Outpatient Medications  Medication Sig Dispense Refill  . albuterol (PROVENTIL HFA;VENTOLIN HFA) 108 (90 Base) MCG/ACT inhaler Inhale 2 puffs into the lungs every 6 (six) hours as needed for wheezing or shortness of breath.    . budesonide-formoterol (SYMBICORT) 80-4.5 MCG/ACT inhaler Inhale 2 puffs into the lungs 2 (two) times daily. 1 Inhaler 12  . cinacalcet  (SENSIPAR) 30 MG tablet Take 1 tablet (30 mg total) by mouth every Monday, Wednesday, and Friday. 60 tablet 0  . citalopram (CELEXA) 40 MG tablet Take 40 mg by mouth daily.     . colchicine 0.6 MG tablet Take 1 tablet (0.6 mg total) by mouth every 3 (three) days. Dose needs to be decreased to twice a week in dialysis patient.    Marland Kitchen doxercalciferol (HECTOROL) 4 MCG/2ML injection Inject 0.5 mLs (1 mcg total) into the vein every Monday, Wednesday, and Friday with hemodialysis. 2 mL 0  . ezetimibe (ZETIA) 10 MG tablet Take 10 mg by mouth daily.     . Ferric Citrate (AURYXIA) 1 GM 210 MG(Fe) TABS Take 2 tablets by mouth 3 (three) times daily before meals.    . gabapentin (NEURONTIN) 100 MG capsule Take 1 capsule (100 mg total) by mouth at bedtime. 30 capsule 0  . HYDROcodone-acetaminophen (NORCO) 5-325 MG tablet Take 1 tablet by mouth every 6 (six) hours as needed for moderate pain. 30 tablet 0  . levothyroxine (SYNTHROID, LEVOTHROID) 100 MCG tablet Take 100 mcg by mouth daily.      Marland Kitchen LORazepam (ATIVAN) 0.5 MG tablet TAKE ONE TABLET BY MOUTH EVERY 6 HOURS AS NEEDED FOR ANXIETY    . midodrine (PROAMATINE) 10 MG tablet Take 1 tablet (10 mg total) by mouth every Monday, Wednesday, and Friday with hemodialysis. 10 tablet 0  . multivitamin (RENA-VIT) TABS tablet Take 1 tablet by mouth daily.    . ondansetron (ZOFRAN) 4 MG tablet Take 1 tablet (4 mg total) by mouth every 6 (six) hours as needed for nausea. 20 tablet 0  . pantoprazole (PROTONIX) 40 MG tablet TAKE ONE TABLET BY MOUTH EVERY DAY 30-60MINUTES BEFORE FIRST MEAL OF THE DAY 30 tablet 2  . Vitamin D, Ergocalciferol, (DRISDOL) 50000 UNITS CAPS Take 50,000 Units by mouth every Monday.     . warfarin (COUMADIN) 5 MG tablet Take 5-7.5 mg by mouth as directed. 5 mg on all days except on Wednesday pt takes 7.5 mg    . ACCU-CHEK AVIVA PLUS test strip     . alendronate (FOSAMAX) 70 MG tablet      No current facility-administered medications for this visit.       Allergies:   Amoxicillin; Sulfa drugs cross reactors; Percocet [oxycodone-acetaminophen]; and Sulfa antibiotics    ROS:  Please see the history of present illness.   Otherwise, review of systems are positive for none.   All other systems are reviewed and negative.    PHYSICAL EXAM: VS:  BP (!) 82/56   Pulse 71   Ht _0  (1.549 m)   Wt 190 lb (86.2 kg)   BMI 35.90 kg/m  ,  BMI Body mass index is 35.9 kg/m.  GENERAL:  Frail appearing NECK:  No jugular venous distention, waveform within normal limits, carotid upstroke brisk and symmetric, no bruits, no thyromegaly LUNGS:  Clear to auscultation bilaterally CHEST:  Unremarkable HEART:  PMI not displaced or sustained,S1 and S2 within normal limits, no S3, no S4, no clicks, no rubs, no murmurs ABD:  Flat, positive bowel sounds normal in frequency in pitch, no bruits, no rebound, no guarding, no midline pulsatile mass, no hepatomegaly, no splenomegaly EXT:  2 plus pulses throughout, no edema, no cyanosis no clubbing SKIN:  Bruising   EKG:  EKG is  ordered today.    Sinus rhythm, rate 80, axis within normal limits, intervals within normal limits, no acute ST-T wave changes. PVCs.     Recent Labs: 05/21/2017: ALT 20; B Natriuretic Peptide 667.9; TSH 0.773 05/28/2017: BUN 40; Creatinine, Ser 5.83; Hemoglobin 11.0; Magnesium 2.0; Platelets 336; Potassium 4.2; Sodium 139    Lipid Panel    Component Value Date/Time   CHOL 107 05/25/2017 0421   TRIG 88 05/25/2017 0421   HDL 32 (L) 05/25/2017 0421   CHOLHDL 3.3 05/25/2017 0421   VLDL 18 05/25/2017 0421   LDLCALC 57 05/25/2017 0421      Wt Readings from Last 3 Encounters:  08/09/17 190 lb (86.2 kg)  06/15/17 193 lb (87.5 kg)  05/28/17 189 lb 3.2 oz (85.8 kg)      Other studies Reviewed: Additional studies/ records that were reviewed today include: Hospital records.  Review of the above records demonstrates:      ASSESSMENT AND PLAN:   PAF: CHADS2-VASc=4.  She  tolerates warfarin.  No change in therapy.   Of note she is going to get labs done this month with Dione Housekeeper, MD    ESRD: Per nephrology.   HYPOTENSION: I am going to change her Midodrine to tid everyday including non dialysis days.  They will let me know how her BP runs.    Current medicines are reviewed at length with the patient today.  The patient does not have concerns regarding medicinesPA.  The following changes have been made:  As above  Labs/ tests ordered today include:     Orders Placed This Encounter  Procedures  . EKG 12-Lead     Disposition:   FU with me in 4 months.     Signed, Minus Breeding, MD  08/09/2017 2:34 PM    [place]

## 2017-08-09 ENCOUNTER — Ambulatory Visit (INDEPENDENT_AMBULATORY_CARE_PROVIDER_SITE_OTHER): Payer: Medicare Other | Admitting: Cardiology

## 2017-08-09 ENCOUNTER — Encounter: Payer: Self-pay | Admitting: Cardiology

## 2017-08-09 ENCOUNTER — Other Ambulatory Visit: Payer: Self-pay | Admitting: Internal Medicine

## 2017-08-09 VITALS — BP 82/56 | HR 71 | Ht 61.0 in | Wt 190.0 lb

## 2017-08-09 DIAGNOSIS — I959 Hypotension, unspecified: Secondary | ICD-10-CM

## 2017-08-09 DIAGNOSIS — R053 Chronic cough: Secondary | ICD-10-CM

## 2017-08-09 DIAGNOSIS — I480 Paroxysmal atrial fibrillation: Secondary | ICD-10-CM

## 2017-08-09 DIAGNOSIS — R05 Cough: Secondary | ICD-10-CM

## 2017-08-09 NOTE — Patient Instructions (Signed)
Medication Instructions:  The current medical regimen is effective;  continue present plan and medications.  Follow-Up: Follow up in 4 months with Dr Percival Spanish.  If you need a refill on your cardiac medications before your next appointment, please call your pharmacy.  Thank you for choosing [facility]!!

## 2017-08-14 ENCOUNTER — Encounter (HOSPITAL_COMMUNITY): Payer: Self-pay

## 2017-08-14 ENCOUNTER — Other Ambulatory Visit: Payer: Self-pay

## 2017-08-14 ENCOUNTER — Emergency Department (HOSPITAL_COMMUNITY): Payer: Medicare Other

## 2017-08-14 ENCOUNTER — Emergency Department (HOSPITAL_COMMUNITY)
Admission: EM | Admit: 2017-08-14 | Discharge: 2017-08-15 | Disposition: A | Discharge status: Home / Self Care | Payer: Medicare Other | Attending: Emergency Medicine | Admitting: Emergency Medicine

## 2017-08-14 DIAGNOSIS — Z992 Dependence on renal dialysis: Secondary | ICD-10-CM | POA: Diagnosis not present

## 2017-08-14 DIAGNOSIS — Z7901 Long term (current) use of anticoagulants: Secondary | ICD-10-CM | POA: Insufficient documentation

## 2017-08-14 DIAGNOSIS — N186 End stage renal disease: Secondary | ICD-10-CM | POA: Diagnosis not present

## 2017-08-14 DIAGNOSIS — I120 Hypertensive chronic kidney disease with stage 5 chronic kidney disease or end stage renal disease: Secondary | ICD-10-CM | POA: Diagnosis not present

## 2017-08-14 DIAGNOSIS — E1122 Type 2 diabetes mellitus with diabetic chronic kidney disease: Secondary | ICD-10-CM | POA: Diagnosis not present

## 2017-08-14 DIAGNOSIS — Z96653 Presence of artificial knee joint, bilateral: Secondary | ICD-10-CM | POA: Diagnosis not present

## 2017-08-14 DIAGNOSIS — Z79899 Other long term (current) drug therapy: Secondary | ICD-10-CM | POA: Diagnosis not present

## 2017-08-14 DIAGNOSIS — R509 Fever, unspecified: Secondary | ICD-10-CM

## 2017-08-14 DIAGNOSIS — E039 Hypothyroidism, unspecified: Secondary | ICD-10-CM | POA: Diagnosis not present

## 2017-08-14 DIAGNOSIS — I959 Hypotension, unspecified: Secondary | ICD-10-CM | POA: Insufficient documentation

## 2017-08-14 DIAGNOSIS — Z96611 Presence of right artificial shoulder joint: Secondary | ICD-10-CM | POA: Diagnosis not present

## 2017-08-14 DIAGNOSIS — Z87891 Personal history of nicotine dependence: Secondary | ICD-10-CM | POA: Diagnosis not present

## 2017-08-14 LAB — COMPREHENSIVE METABOLIC PANEL
ALT: 28 U/L (ref 14–54)
AST: 38 U/L (ref 15–41)
Albumin: 3.7 g/dL (ref 3.5–5.0)
Alkaline Phosphatase: 167 U/L — ABNORMAL HIGH (ref 38–126)
Anion gap: 9 (ref 5–15)
BUN: 13 mg/dL (ref 6–20)
CO2: 29 mmol/L (ref 22–32)
Calcium: 8.3 mg/dL — ABNORMAL LOW (ref 8.9–10.3)
Chloride: 99 mmol/L — ABNORMAL LOW (ref 101–111)
Creatinine, Ser: 3.63 mg/dL — ABNORMAL HIGH (ref 0.44–1.00)
GFR calc Af Amer: 13 mL/min — ABNORMAL LOW (ref >60)
GFR calc non Af Amer: 11 mL/min — ABNORMAL LOW (ref >60)
Glucose, Bld: 86 mg/dL (ref 65–99)
Potassium: 4.4 mmol/L (ref 3.5–5.1)
Sodium: 137 mmol/L (ref 135–145)
Total Bilirubin: 0.4 mg/dL (ref 0.3–1.2)
Total Protein: 6.9 g/dL (ref 6.5–8.1)

## 2017-08-14 LAB — CBC WITH DIFFERENTIAL/PLATELET
Basophils Absolute: 0 10*3/uL (ref 0.0–0.1)
Basophils Relative: 1 %
Eosinophils Absolute: 0.4 10*3/uL (ref 0.0–0.7)
Eosinophils Relative: 6 %
HCT: 37.5 % (ref 36.0–46.0)
Hemoglobin: 11.8 g/dL — ABNORMAL LOW (ref 12.0–15.0)
Lymphocytes Relative: 20 %
Lymphs Abs: 1.4 10*3/uL (ref 0.7–4.0)
MCH: 30.3 pg (ref 26.0–34.0)
MCHC: 31.5 g/dL (ref 30.0–36.0)
MCV: 96.2 fL (ref 78.0–100.0)
Monocytes Absolute: 0.6 10*3/uL (ref 0.1–1.0)
Monocytes Relative: 9 %
Neutro Abs: 4.3 10*3/uL (ref 1.7–7.7)
Neutrophils Relative %: 64 %
Platelets: 243 10*3/uL (ref 150–400)
RBC: 3.9 MIL/uL (ref 3.87–5.11)
RDW: 15.4 % (ref 11.5–15.5)
WBC: 6.8 10*3/uL (ref 4.0–10.5)

## 2017-08-14 LAB — PROTIME-INR
INR: 1.77
Prothrombin Time: 20.5 seconds — ABNORMAL HIGH (ref 11.4–15.2)

## 2017-08-14 LAB — I-STAT CG4 LACTIC ACID, ED: Lactic Acid, Venous: 1.24 mmol/L (ref 0.5–1.9)

## 2017-08-14 NOTE — ED Triage Notes (Signed)
Pt coming from PCP with complaint of low blood pressure, low grade fever. Pt had dialysis today and received 1000mg  of vanc for low grade fever. Pt denies shortness of breath. Vitals all within normal limits in triage.

## 2017-08-14 NOTE — ED Notes (Signed)
EKG did not cross over in MUSE. Hardcopy available in Triage.

## 2017-08-14 NOTE — ED Notes (Signed)
Patient O2 low, given new tank.

## 2017-08-15 MED ORDER — AZITHROMYCIN 250 MG PO TABS: 250.0000 mg | ORAL_TABLET | Freq: Every day | ORAL | 0 refills | Status: DC

## 2017-08-15 NOTE — Discharge Instructions (Addendum)
Zithromax as prescribed.  Return to the emergency department for difficulty breathing, severe chest pains, or other new and concerning symptoms.

## 2017-08-15 NOTE — ED Provider Notes (Addendum)
[facility] EMERGENCY DEPARTMENT Provider Note   CSN: [id number] Arrival date & time: 08/14/17  1443     History   Chief Complaint Chief Complaint  Patient presents with  . Fever    HPI [name] is a 77 y.o. female.  Patient is a 77 year old female with past medical history of end-stage renal disease on hemodialysis, hypertension, paroxysmal A. fib, diabetes.  She presents today for evaluation of fever and low blood pressure.  She had dialysis this morning and was told she had a low blood pressure and low-grade fever.  She was given a gram of vancomycin, then sent to her doctor's office, who in turn sent her here.  The patient does report a 3-day history of congestion and cough but denies any nausea, vomiting, diarrhea, abdominal pain, or other symptoms.  Her blood pressure has been normal after arriving here and she has been afebrile.  The history is provided by the patient.  Fever   This is a new problem. The current episode started yesterday. The problem has been resolved. The maximum temperature noted was 100 to 100.9 F. She has tried nothing for the symptoms. The treatment provided no relief.    Past Medical History:  Diagnosis Date  . A-fib ([place])   . Acute respiratory failure ([place]) 05/2017  . Anemia   . Arthritis   . Diabetes mellitus    "diet controlled"  . Diabetes mellitus without complication ([place])   . ESRD (end stage renal disease) ([place])    dialysis MWF  . GERD (gastroesophageal reflux disease)   . Gout   . History of blood transfusion   . HLD (hyperlipidemia)   . HOH (hard of hearing)    left ear  . Hypertension    hypotensive -since starting dialysis  . Hypothyroidism   . PAF (paroxysmal atrial fibrillation) ([place])    a. Echo 11/16:  Mild LVH, EF 55-60%, normal wall motion, MAC, mild MR, severe LAE (49 ml/m2), mild RVE, normal RVSF, mild RAE, mild TR, PASP 24 mmHg;  CHADS2-VASc: 4 >> Coumadin followed by PCP  . Renal disorder      Patient Active Problem List   Diagnosis Date Noted  . Chronic anticoagulation 06/15/2017  . MRSA (methicillin resistant staph aureus) culture positive   . Community acquired pneumonia of right lung   . Morbid obesity due to excess calories ([place]) 05/03/2016  . Acute respiratory failure with hypoxia ([place]) 04/14/2016  . Acute bronchitis 04/13/2016  . Fever 04/12/2016  . Sepsis ([place]) 04/12/2016  . Diabetes mellitus with complication ([place])   . Idiopathic hypotension   . Gastroesophageal reflux disease without esophagitis   . 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 Cough variant asthma vs UACS  09/01/2015  . Anemia due to vitamin B12 deficiency 04/21/2015  . Anxiety, generalized 04/21/2015  . Atrial fibrillation ([place]) 02/16/2015  . Dyslipidemia   . Anemia   . PAF (paroxysmal atrial fibrillation) ([place]) 02/04/2015  . Diabetes mellitus type 2, diet-controlled ([place])   . Hypothyroidism   . High cholesterol   . Steal syndrome dialysis vascular access ([place]) 12/03/2014  . Physical deconditioning 08/04/2013  . Hyperkalemia 07/26/2013  . CKD (chronic kidney disease) stage 5, GFR less than 15 ml/min ([facility]) 07/26/2013  . ESRD (end stage renal disease) on dialysis ([place]) 03/15/2012    Past Surgical History:  Procedure Laterality Date  . AV FISTULA PLACEMENT  04/03/2012   Procedure: ARTERIOVENOUS (AV) FISTULA CREATION;  Surgeon: Elam Dutch, MD;  Location: [facility] OR;  Service: Vascular;  Laterality: Left;  creation left brachial cephalic fistula   . CARPAL TUNNEL RELEASE Left   . COLONOSCOPY W/ POLYPECTOMY    . DILATION AND CURETTAGE OF UTERUS    . EYE SURGERY Bilateral    bilateral cataract removal  . Hemodialysis  catheter Right   . IR FLUORO GUIDE CV LINE RIGHT  05/26/2017  . IR REMOVAL TUN CV CATH W/O FL  05/24/2017  . IR US GUIDE VASC ACCESS RIGHT  05/26/2017  . JOINT REPLACEMENT Bilateral    bilateral knee  . JOINT REPLACEMENT Right    shoulder  . LIGATION OF ARTERIOVENOUS   FISTULA Left 02/04/2015   Procedure: LIGATION OF BRACHIOCEPHALIC ARTERIOVENOUS  FISTULA;  Surgeon: Conrad Eldon, MD;  Location: [facility];  Service: Vascular;  Laterality: Left;  . PORTACATH PLACEMENT    . REVERSE SHOULDER ARTHROPLASTY Left 01/22/2016   Procedure: LEFT REVERSE SHOULDER ARTHROPLASTY;  Surgeon: Netta Cedars, MD;  Location: [facility];  Service: Orthopedics;  Laterality: Left;  . SPLIT NIGHT STUDY  07/26/2015  . STERIOD INJECTION Right 01/22/2016   Procedure: RIGHT RING FINGER STEROID INJECTION;  Surgeon: Netta Cedars, MD;  Location: [facility];  Service: Orthopedics;  Laterality: Right;  . TEE WITHOUT CARDIOVERSION N/A 05/26/2017   Procedure: TRANSESOPHAGEAL ECHOCARDIOGRAM (TEE);  Surgeon: Lelon Perla, MD;  Location: [facility];  Service: Cardiovascular;  Laterality: N/A;  . THROMBECTOMY BRACHIAL ARTERY Left 02/06/2015   Procedure: EVACUATION OF LEFT ARM HEMATOMA;  Surgeon: Angelia Mould, MD;  Location: [facility];  Service: Vascular;  Laterality: Left;  . TOTAL KNEE ARTHROPLASTY     right knee  . TUBAL LIGATION       OB History    Gravida  0   Para  0   Term  0   Preterm  0   AB  0   Living        SAB  0   TAB  0   Ectopic  0   Multiple      Live Births               Home Medications    Prior to Admission medications   Medication Sig Start Date End Date Taking? Authorizing Provider  ACCU-CHEK AVIVA PLUS test strip  11/11/14   [provider]  albuterol (PROVENTIL HFA;VENTOLIN HFA) 108 (90 Base) MCG/ACT inhaler Inhale 2 puffs into the lungs every 6 (six) hours as needed for wheezing or shortness of breath.    [provider]  alendronate (FOSAMAX) 70 MG tablet  08/07/17   [provider]  budesonide-formoterol (SYMBICORT) 80-4.5 MCG/ACT inhaler Inhale 2 puffs into the lungs 2 (two) times daily. 01/09/17   Tanda Rockers, MD  cinacalcet (SENSIPAR) 30 MG tablet Take 1 tablet (30 mg total) by mouth every Monday, Wednesday, and Friday.  05/28/17   Allie Bossier, MD  citalopram (CELEXA) 40 MG tablet Take 40 mg by mouth daily.  05/29/17   [provider]  colchicine 0.6 MG tablet Take 1 tablet (0.6 mg total) by mouth every 3 (three) days. Dose needs to be decreased to twice a week in dialysis patient. 04/14/16   Debbe Odea, MD  doxercalciferol (HECTOROL) 4 MCG/2ML injection Inject 0.5 mLs (1 mcg total) into the vein every Monday, Wednesday, and Friday with hemodialysis. 05/29/17   Allie Bossier, MD  ezetimibe (ZETIA) 10 MG tablet Take 10 mg by mouth daily.     [provider]  Ferric Citrate (AURYXIA) 1 GM 210 MG(Fe) TABS Take 2 tablets by mouth 3 (three) times daily before meals.    [provider]  gabapentin (NEURONTIN) 100 MG capsule Take 1 capsule (100 mg total) by mouth at bedtime. 05/28/17   Allie Bossier, MD  HYDROcodone-acetaminophen (NORCO) 5-325 MG tablet Take 1 tablet by mouth every 6 (six) hours as needed for moderate pain. 01/22/16   Netta Cedars, MD  levothyroxine (SYNTHROID, LEVOTHROID) 100 MCG tablet Take 100 mcg by mouth daily.      [provider]  LORazepam (ATIVAN) 0.5 MG tablet TAKE ONE TABLET BY MOUTH EVERY 6 HOURS AS NEEDED FOR ANXIETY 09/01/16   [provider]  midodrine (PROAMATINE) 10 MG tablet Take 1 tablet (10 mg total) by mouth every Monday, Wednesday, and Friday with hemodialysis. 05/29/17   Allie Bossier, MD  multivitamin (RENA-VIT) TABS tablet Take 1 tablet by mouth daily.    [provider]  ondansetron (ZOFRAN) 4 MG tablet Take 1 tablet (4 mg total) by mouth every 6 (six) hours as needed for nausea. 05/28/17   Allie Bossier, MD  pantoprazole (PROTONIX) 40 MG tablet TAKE ONE TABLET BY MOUTH EVERY DAY 30-60MINUTES BEFORE FIRST MEAL OF THE DAY 08/09/17   Tanda Rockers, MD  Vitamin D, Ergocalciferol, (DRISDOL) 50000 UNITS CAPS Take 50,000 Units by mouth every Monday.     [provider]  warfarin (COUMADIN) 5 MG tablet Take 5-7.5 mg by  mouth as directed. 5 mg on all days except on Wednesday pt takes 7.5 mg    [provider]    Family History Family History  Problem Relation Age of Onset  . Diabetes Father        before age 23  . Heart disease Father   . Diabetes Sister   . Cancer Brother   . Hyperlipidemia Daughter   . Hypertension Daughter   . Hypertension Son   . Lung cancer Sister   . Cancer Sister     Social History Social History   Tobacco Use  . Smoking status: Former Smoker    Packs/day: 0.25    Years: 2.00    Pack years: 0.50    Types: Cigarettes    Last attempt to quit: 04/04/1998    Years since quitting: 19.3  . Smokeless tobacco: Never Used  Substance Use Topics  . Alcohol use: No    Alcohol/week: 0.0 oz  . Drug use: No     Allergies   Amoxicillin; Sulfa drugs cross reactors; Percocet [oxycodone-acetaminophen]; and Sulfa antibiotics   Review of Systems Review of Systems  Constitutional: Positive for fever.  All other systems reviewed and are negative.    Physical Exam Updated Vital Signs BP 108/73 (BP Location: Right Arm)   Pulse 76   Temp 98.2 F (36.8 C) (Oral)   Resp 18   SpO2 100%   Physical Exam  Constitutional: She is oriented to person, place, and time. She appears well-developed and well-nourished. No distress.  HENT:  Head: Normocephalic and atraumatic.  Mouth/Throat: Oropharynx is clear and moist.  Eyes: Pupils are equal, round, and reactive to light. EOM are normal.  Neck: Normal range of motion. Neck supple.  Cardiovascular: Normal rate and regular rhythm. Exam reveals no gallop and no friction rub.  No murmur heard. Pulmonary/Chest: Effort normal and breath sounds normal. No respiratory distress. She has no wheezes.  Abdominal: Soft. Bowel sounds are normal. She exhibits no distension. There is no tenderness.  Musculoskeletal: Normal range of motion. She exhibits no edema or tenderness.  Neurological: She is alert and oriented to person, place, and  time. No cranial nerve deficit. She exhibits normal muscle tone.  Skin: Skin is warm and dry. She is not diaphoretic.  Nursing note and vitals reviewed.    ED Treatments / Results  Labs (all labs ordered are listed, but only abnormal results are displayed) Labs Reviewed  COMPREHENSIVE METABOLIC PANEL - Abnormal; Notable for the following components:      Result Value   Chloride 99 (*)    Creatinine, Ser 3.63 (*)    Calcium 8.3 (*)    Alkaline Phosphatase 167 (*)    GFR calc non Af Amer 11 (*)    GFR calc Af Amer 13 (*)    All other components within normal limits  CBC WITH DIFFERENTIAL/PLATELET - Abnormal; Notable for the following components:   Hemoglobin 11.8 (*)    All other components within normal limits  PROTIME-INR - Abnormal; Notable for the following components:   Prothrombin Time 20.5 (*)    All other components within normal limits  I-STAT CG4 LACTIC ACID, ED  I-STAT CG4 LACTIC ACID, ED    ED ECG REPORT   Date: 08/22/2017  Rate: 72  Rhythm: normal sinus rhythm  QRS Axis: left  Intervals: normal  ST/T Wave abnormalities: normal  Conduction Disutrbances:nonspecific intraventricular conduction delay  Narrative Interpretation:   Old EKG Reviewed: none available  I have personally reviewed the EKG tracing and agree with the computerized printout as noted.   Radiology Dg Chest 2 View  Result Date: 08/14/2017 CLINICAL DATA:  Low blood pressure, low-grade fever. EXAM: CHEST - 2 VIEW COMPARISON:  Chest x-ray dated 07/11/2017. FINDINGS: Heart size and mediastinal contours are stable. Lungs are clear. No pleural effusion or pneumothorax seen. LEFT sided dialysis catheter is stable in position with tip at the expected level of the cavoatrial junction. No acute or suspicious osseous finding. IMPRESSION: No active cardiopulmonary disease. No evidence of pneumonia or pulmonary edema. Electronically Signed   By: Franki Cabot M.D.   On: 08/14/2017 19:00     Procedures Procedures (including critical care time)  Medications Ordered in ED Medications - No data to display   Initial Impression / Assessment and Plan / ED Course  I have reviewed the triage vital signs and the nursing notes.  Pertinent labs & imaging results that were available during my care of the patient were reviewed by me and considered in my medical decision making (see chart for details).  Patient's work-up shows no significant abnormality.  She has no white count, a normal lactate, she is afebrile, and chest x-ray shows no evidence for edema.  I am uncertain as to the exact etiology of her symptoms, however sounds that she may have a URI.  There is no evidence for pneumonia or sepsis.  She will be treated with zithromax for URI, to return prn as needed.  Final Clinical Impressions(s) / ED Diagnoses   Final diagnoses:  None    ED Discharge Orders    None       Veryl Speak, MD 08/15/17 [id number]    Veryl Speak, MD 08/22/17 [phone]

## 2017-09-26 DIAGNOSIS — G5602 Carpal tunnel syndrome, left upper limb: Secondary | ICD-10-CM | POA: Insufficient documentation

## 2017-10-04 ENCOUNTER — Emergency Department (HOSPITAL_COMMUNITY): Payer: Medicare Other

## 2017-10-04 ENCOUNTER — Encounter (HOSPITAL_COMMUNITY): Payer: Self-pay | Admitting: Emergency Medicine

## 2017-10-04 ENCOUNTER — Emergency Department (HOSPITAL_COMMUNITY)
Admission: EM | Admit: 2017-10-04 | Discharge: 2017-10-04 | Disposition: A | Discharge status: Home / Self Care | Payer: Medicare Other | Attending: Emergency Medicine | Admitting: Emergency Medicine

## 2017-10-04 ENCOUNTER — Other Ambulatory Visit: Payer: Self-pay

## 2017-10-04 DIAGNOSIS — N186 End stage renal disease: Secondary | ICD-10-CM | POA: Insufficient documentation

## 2017-10-04 DIAGNOSIS — Y929 Unspecified place or not applicable: Secondary | ICD-10-CM | POA: Diagnosis not present

## 2017-10-04 DIAGNOSIS — I480 Paroxysmal atrial fibrillation: Secondary | ICD-10-CM | POA: Insufficient documentation

## 2017-10-04 DIAGNOSIS — Z96653 Presence of artificial knee joint, bilateral: Secondary | ICD-10-CM | POA: Diagnosis not present

## 2017-10-04 DIAGNOSIS — W0110XA Fall on same level from slipping, tripping and stumbling with subsequent striking against unspecified object, initial encounter: Secondary | ICD-10-CM | POA: Diagnosis not present

## 2017-10-04 DIAGNOSIS — I120 Hypertensive chronic kidney disease with stage 5 chronic kidney disease or end stage renal disease: Secondary | ICD-10-CM | POA: Insufficient documentation

## 2017-10-04 DIAGNOSIS — W19XXXA Unspecified fall, initial encounter: Secondary | ICD-10-CM

## 2017-10-04 DIAGNOSIS — Z96611 Presence of right artificial shoulder joint: Secondary | ICD-10-CM | POA: Insufficient documentation

## 2017-10-04 DIAGNOSIS — Z87891 Personal history of nicotine dependence: Secondary | ICD-10-CM | POA: Diagnosis not present

## 2017-10-04 DIAGNOSIS — Y9301 Activity, walking, marching and hiking: Secondary | ICD-10-CM | POA: Insufficient documentation

## 2017-10-04 DIAGNOSIS — E119 Type 2 diabetes mellitus without complications: Secondary | ICD-10-CM | POA: Insufficient documentation

## 2017-10-04 DIAGNOSIS — E039 Hypothyroidism, unspecified: Secondary | ICD-10-CM | POA: Diagnosis not present

## 2017-10-04 DIAGNOSIS — Y999 Unspecified external cause status: Secondary | ICD-10-CM | POA: Insufficient documentation

## 2017-10-04 DIAGNOSIS — S0990XA Unspecified injury of head, initial encounter: Secondary | ICD-10-CM | POA: Insufficient documentation

## 2017-10-04 DIAGNOSIS — Z992 Dependence on renal dialysis: Secondary | ICD-10-CM | POA: Insufficient documentation

## 2017-10-04 DIAGNOSIS — Z79899 Other long term (current) drug therapy: Secondary | ICD-10-CM | POA: Insufficient documentation

## 2017-10-04 LAB — BASIC METABOLIC PANEL
Anion gap: 13 (ref 5–15)
BUN: 61 mg/dL — ABNORMAL HIGH (ref 8–23)
CO2: 24 mmol/L (ref 22–32)
Calcium: 7.8 mg/dL — ABNORMAL LOW (ref 8.9–10.3)
Chloride: 106 mmol/L (ref 98–111)
Creatinine, Ser: 7.61 mg/dL — ABNORMAL HIGH (ref 0.44–1.00)
GFR calc Af Amer: 5 mL/min — ABNORMAL LOW (ref >60)
GFR calc non Af Amer: 5 mL/min — ABNORMAL LOW (ref >60)
Glucose, Bld: 99 mg/dL (ref 70–99)
Potassium: 4 mmol/L (ref 3.5–5.1)
Sodium: 143 mmol/L (ref 135–145)

## 2017-10-04 LAB — CBC WITH DIFFERENTIAL/PLATELET
Abs Immature Granulocytes: 0.1 10*3/uL (ref 0.0–0.1)
Basophils Absolute: 0 10*3/uL (ref 0.0–0.1)
Basophils Relative: 0 %
Eosinophils Absolute: 0 10*3/uL (ref 0.0–0.7)
Eosinophils Relative: 0 %
HCT: 41.8 % (ref 36.0–46.0)
Hemoglobin: 13 g/dL (ref 12.0–15.0)
Immature Granulocytes: 1 %
Lymphocytes Relative: 10 %
Lymphs Abs: 0.9 10*3/uL (ref 0.7–4.0)
MCH: 30.6 pg (ref 26.0–34.0)
MCHC: 31.1 g/dL (ref 30.0–36.0)
MCV: 98.4 fL (ref 78.0–100.0)
Monocytes Absolute: 0.6 10*3/uL (ref 0.1–1.0)
Monocytes Relative: 7 %
Neutro Abs: 7 10*3/uL (ref 1.7–7.7)
Neutrophils Relative %: 82 %
Platelets: 266 10*3/uL (ref 150–400)
RBC: 4.25 MIL/uL (ref 3.87–5.11)
RDW: 15.7 % — ABNORMAL HIGH (ref 11.5–15.5)
WBC: 8.6 10*3/uL (ref 4.0–10.5)

## 2017-10-04 LAB — PROTIME-INR
INR: 2.95
Prothrombin Time: 30.5 seconds — ABNORMAL HIGH (ref 11.4–15.2)

## 2017-10-04 LAB — I-STAT TROPONIN, ED: Troponin i, poc: 0.04 ng/mL (ref 0.00–0.08)

## 2017-10-04 LAB — APTT: aPTT: 42 seconds — ABNORMAL HIGH (ref 24–36)

## 2017-10-04 NOTE — ED Provider Notes (Signed)
  Face-to-face evaluation   History: She presents for evaluation of injury from fall.  She was walking with her walker, up a ramp, when she fell striking the back of her head.  She was unable to stand without help.  She presents by EMS.  Patient had been going to dialysis when she fell.  Currently describes mild headache.  She denies shortness of breath, weakness or dizziness.  Physical exam: Elderly, somewhat obese female.  Heart regular rate and rhythm.  No murmur.  Lungs clear anteriorly.  Chest wall nontender palpation.  She moves arms and legs equally.  Good range of motion neck without pain.  No palpable defect of the skull or scalp.  Medical screening examination/treatment/procedure(s) were conducted as a shared visit with non-physician practitioner(s) and myself.  I personally evaluated the patient during the encounter    Daleen Bo, MD 10/05/17 [phone]

## 2017-10-04 NOTE — ED Notes (Signed)
Pt states she makes very little urine due to dialysis.

## 2017-10-04 NOTE — ED Notes (Signed)
Patient transported to MRI 

## 2017-10-04 NOTE — ED Notes (Signed)
Patient transported to CT 

## 2017-10-04 NOTE — ED Notes (Signed)
ED Provider at bedside. 

## 2017-10-04 NOTE — ED Notes (Signed)
Pt stable, ambulatory with assistance, and verbalizes understanding of d/c instructions.

## 2017-10-04 NOTE — ED Provider Notes (Signed)
[place] [facility] EMERGENCY DEPARTMENT Provider Note   CSN: [id number] Arrival date & time: 10/04/17  3614     History   Chief Complaint Chief Complaint  Patient presents with  . Fall  . Dialysis Patient    HPI [name] is a 77 y.o. female who presents with a fall. 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  The patient was walking to dialysis this morning with her walker and her foot twisted and she lost her balance and fell backwards and hit her head on her back.  She was unable to get up without assistance.  The patient reports a mild headache and some dizziness but denies severe pain.  She was last dialyzed on Monday.  Her daughter-in-law who is healthcare power of attorney is at bedside.  The patient denies neck pain, back pain, buttocks pain.  She denies chest pain, shortness of breath, or abdominal pain. She has had some intermittent tingling in her left arm which was attributed to a radiculopathy. She also has had some balance problems at home and intermittent blurry vision today. Her INR was last checked on June 18 and was 2.1.   PCP: [facility] Nephrology: Lorrene Reid Cardio: H[place] Pulmonology: Melvyn Novas  HPI  Past Medical History:  Diagnosis Date  . A-fib ([place])   . Acute respiratory failure ([place]) 05/2017  . Anemia   . Arthritis   . Diabetes mellitus    "diet controlled"  . Diabetes mellitus without complication ([place])   . ESRD (end stage renal disease) ([place])    dialysis MWF  . GERD (gastroesophageal reflux disease)   . Gout   . History of blood transfusion   . HLD (hyperlipidemia)   . HOH (hard of hearing)    left ear  . Hypertension    hypotensive -since starting dialysis  . Hypothyroidism   . PAF (paroxysmal atrial fibrillation) ([place])    a. Echo 11/16:  Mild LVH, EF 55-60%, normal wall motion, MAC, mild MR, severe LAE (49 ml/m2), mild RVE, normal RVSF, mild RAE, mild TR, PASP 24 mmHg;  CHADS2-VASc: 4 >>  Coumadin followed by PCP  . Renal disorder     Patient Active Problem List   Diagnosis Date Noted  . Chronic anticoagulation 06/15/2017  . MRSA (methicillin resistant staph aureus) culture positive   . Community acquired pneumonia of right lung   . Morbid obesity due to excess calories ([place]) 05/03/2016  . Acute respiratory failure with hypoxia ([place]) 04/14/2016  . Acute bronchitis 04/13/2016  . Fever 04/12/2016  . Sepsis ([place]) 04/12/2016  . Diabetes mellitus with complication ([place])   . Idiopathic hypotension   . Gastroesophageal reflux disease without esophagitis   . 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 Cough variant asthma vs UACS  09/01/2015  . Anemia due to vitamin B12 deficiency 04/21/2015  . Anxiety, generalized 04/21/2015  . Atrial fibrillation ([place]) 02/16/2015  . Dyslipidemia   . Anemia   . PAF (paroxysmal atrial fibrillation) ([place]) 02/04/2015  . Diabetes mellitus type 2, diet-controlled ([place])   . Hypothyroidism   . High cholesterol   . Steal syndrome dialysis vascular access ([place]) 12/03/2014  . Physical deconditioning 08/04/2013  . Hyperkalemia 07/26/2013  . CKD (chronic kidney disease) stage 5, GFR less than 15 ml/min ([facility]) 07/26/2013  . ESRD (end stage renal disease) on dialysis ([place]) 03/15/2012    Past Surgical History:  Procedure Laterality Date  . AV FISTULA PLACEMENT  04/03/2012   Procedure: ARTERIOVENOUS (AV) FISTULA CREATION;  Surgeon: Elam Dutch, MD;  Location: [facility] OR;  Service: Vascular;  Laterality: Left;  creation left brachial cephalic fistula   . CARPAL TUNNEL RELEASE Left   . COLONOSCOPY W/ POLYPECTOMY    . DILATION AND CURETTAGE OF UTERUS    . EYE SURGERY Bilateral    bilateral cataract removal  . Hemodialysis  catheter Right   . IR FLUORO GUIDE CV LINE RIGHT  05/26/2017  . IR REMOVAL TUN CV CATH W/O FL  05/24/2017  . IR US GUIDE VASC ACCESS RIGHT  05/26/2017  . JOINT REPLACEMENT Bilateral    bilateral knee  . JOINT REPLACEMENT Right     shoulder  . LIGATION OF ARTERIOVENOUS  FISTULA Left 02/04/2015   Procedure: LIGATION OF BRACHIOCEPHALIC ARTERIOVENOUS  FISTULA;  Surgeon: Conrad Forestville, MD;  Location: [facility];  Service: Vascular;  Laterality: Left;  . PORTACATH PLACEMENT    . REVERSE SHOULDER ARTHROPLASTY Left 01/22/2016   Procedure: LEFT REVERSE SHOULDER ARTHROPLASTY;  Surgeon: Netta Cedars, MD;  Location: [facility];  Service: Orthopedics;  Laterality: Left;  . SPLIT NIGHT STUDY  07/26/2015  . STERIOD INJECTION Right 01/22/2016   Procedure: RIGHT RING FINGER STEROID INJECTION;  Surgeon: Netta Cedars, MD;  Location: [facility];  Service: Orthopedics;  Laterality: Right;  . TEE WITHOUT CARDIOVERSION N/A 05/26/2017   Procedure: TRANSESOPHAGEAL ECHOCARDIOGRAM (TEE);  Surgeon: Lelon Perla, MD;  Location: [facility];  Service: Cardiovascular;  Laterality: N/A;  . THROMBECTOMY BRACHIAL ARTERY Left 02/06/2015   Procedure: EVACUATION OF LEFT ARM HEMATOMA;  Surgeon: Angelia Mould, MD;  Location: [facility];  Service: Vascular;  Laterality: Left;  . TOTAL KNEE ARTHROPLASTY     right knee  . TUBAL LIGATION       OB History    Gravida  0   Para  0   Term  0   Preterm  0   AB  0   Living        SAB  0   TAB  0   Ectopic  0   Multiple      Live Births               Home Medications    Prior to Admission medications   Medication Sig Start Date End Date Taking? Authorizing Provider  ACCU-CHEK AVIVA PLUS test strip  11/11/14   [provider]  albuterol (PROVENTIL HFA;VENTOLIN HFA) 108 (90 Base) MCG/ACT inhaler Inhale 2 puffs into the lungs every 6 (six) hours as needed for wheezing or shortness of breath.    [provider]  alendronate (FOSAMAX) 70 MG tablet  08/07/17   [provider]  azithromycin (ZITHROMAX) 250 MG tablet Take 1 tablet (250 mg total) by mouth daily. Take first 2 tablets together, then 1 every day until finished. 08/15/17   Veryl Speak, MD  budesonide-formoterol  (SYMBICORT) 80-4.5 MCG/ACT inhaler Inhale 2 puffs into the lungs 2 (two) times daily. 01/09/17   Tanda Rockers, MD  cinacalcet (SENSIPAR) 30 MG tablet Take 1 tablet (30 mg total) by mouth every Monday, Wednesday, and Friday. 05/28/17   Allie Bossier, MD  citalopram (CELEXA) 40 MG tablet Take 40 mg by mouth daily.  05/29/17   [provider]  colchicine 0.6 MG tablet Take 1 tablet (0.6 mg total) by mouth every 3 (three) days. Dose needs to be decreased to twice a week in dialysis patient. 04/14/16   Debbe Odea, MD  doxercalciferol (HECTOROL) 4 MCG/2ML injection Inject 0.5 mLs (1 mcg total) into the vein every Monday, Wednesday, and Friday with hemodialysis. 05/29/17   Allie Bossier, MD  ezetimibe (ZETIA) 10 MG tablet Take 10 mg by mouth daily.     [provider]  Ferric Citrate (AURYXIA) 1 GM 210 MG(Fe) TABS Take 2 tablets by mouth 3 (three) times daily before meals.    [provider]  gabapentin (NEURONTIN) 100 MG capsule Take 1 capsule (100 mg total) by mouth at bedtime. 05/28/17   Allie Bossier, MD  HYDROcodone-acetaminophen (NORCO) 5-325 MG tablet Take 1 tablet by mouth every 6 (six) hours as needed for moderate pain. 01/22/16   Netta Cedars, MD  levothyroxine (SYNTHROID, LEVOTHROID) 100 MCG tablet Take 100 mcg by mouth daily.      [provider]  LORazepam (ATIVAN) 0.5 MG tablet TAKE ONE TABLET BY MOUTH EVERY 6 HOURS AS NEEDED FOR ANXIETY 09/01/16   [provider]  midodrine (PROAMATINE) 10 MG tablet Take 1 tablet (10 mg total) by mouth every Monday, Wednesday, and Friday with hemodialysis. 05/29/17   Allie Bossier, MD  multivitamin (RENA-VIT) TABS tablet Take 1 tablet by mouth daily.    [provider]  ondansetron (ZOFRAN) 4 MG tablet Take 1 tablet (4 mg total) by mouth every 6 (six) hours as needed for nausea. 05/28/17   Allie Bossier, MD  pantoprazole (PROTONIX) 40 MG tablet TAKE ONE TABLET BY MOUTH EVERY DAY 30-60MINUTES BEFORE  FIRST MEAL OF THE DAY 08/09/17   Tanda Rockers, MD  Vitamin D, Ergocalciferol, (DRISDOL) 50000 UNITS CAPS Take 50,000 Units by mouth every Monday.     [provider]  warfarin (COUMADIN) 5 MG tablet Take 5-7.5 mg by mouth as directed. 5 mg on all days except on Wednesday pt takes 7.5 mg    [provider]    Family History Family History  Problem Relation Age of Onset  . Diabetes Father        before age 72  . Heart disease Father   . Diabetes Sister   . Cancer Brother   . Hyperlipidemia Daughter   . Hypertension Daughter   . Hypertension Son   . Lung cancer Sister   . Cancer Sister     Social History Social History   Tobacco Use  . Smoking status: Former Smoker    Packs/day: 0.25    Years: 2.00    Pack years: 0.50    Types: Cigarettes    Last attempt to quit: 04/04/1998    Years since quitting: 19.5  . Smokeless tobacco: Never Used  Substance Use Topics  . Alcohol use: No    Alcohol/week: 0.0 oz  . Drug use: No     Allergies   Amoxicillin; Sulfa drugs cross reactors; Percocet [oxycodone-acetaminophen]; and Sulfa antibiotics   Review of Systems Review of Systems  Respiratory: Negative for shortness of breath.   Cardiovascular: Negative for chest pain.  Gastrointestinal: Negative for abdominal pain, nausea and vomiting.  Neurological: Positive for dizziness and headaches. Negative for syncope.  All other systems reviewed and are negative.   Physical Exam Updated Vital Signs BP (!) 159/99   Pulse (!) 49   Temp 98.1 F (36.7 C) (Oral)   Resp 20   Ht 5' 2"  (1.575 m)   Wt 85.5 kg (188 lb 7.9 oz)   SpO2 100%   BMI 34.48 kg/m   Physical Exam  Constitutional: She is oriented to person, place,  and time. She appears well-developed and well-nourished. No distress.  Calm and cooperative.  On 3-1/2 L via nasal cannula  HENT:  Head: Normocephalic and atraumatic.  No obvious head injury  Eyes: Pupils are equal, round, and reactive to light.  Conjunctivae are normal. Right eye exhibits no discharge. Left eye exhibits no discharge. No scleral icterus.  Neck: Normal range of motion.  No midline neck tenderness  Cardiovascular: Normal rate. An irregularly irregular rhythm present. Exam reveals no gallop and no friction rub.  No murmur heard. Pulmonary/Chest: Effort normal and breath sounds normal. No respiratory distress.  Hemodialysis catheter in the left upper chest wall  Abdominal: Soft. Bowel sounds are normal. She exhibits no distension. There is no tenderness.  Musculoskeletal:  Straightening device on the left lower leg.  Ankle brace on the right ankle  No midline tenderness of the thoracic or lumbar spine  Neurological: She is alert and oriented to person, place, and time.  Mental Status:  Alert, oriented, thought content appropriate, able to give a coherent history. Speech fluent without evidence of aphasia. Able to follow 2 step commands without difficulty.  Cranial Nerves:  II:  Peripheral visual fields grossly normal, pupils equal, round, reactive to light III,IV, VI: ptosis not present, marked lateral nystagmus.  V,VII: smile symmetric, facial light touch sensation equal VIII: hearing grossly normal to voice  X: uvula elevates symmetrically  XI: bilateral shoulder shrug symmetric and strong XII: midline tongue extension without fassiculations Motor:  Normal tone. 5/5 in upper and lower extremities bilaterally including strong and equal grip strength and dorsiflexion/plantar flexion Sensory: Pinprick and light touch normal in all extremities.  Cerebellar: normal finger-to-nose with bilateral upper extremities Gait: not tested CV: distal pulses palpable throughout    Skin: Skin is warm and dry.  Psychiatric: She has a normal mood and affect. Her behavior is normal.  Nursing note and vitals reviewed.    ED Treatments / Results  Labs (all labs ordered are listed, but only abnormal results are displayed) Labs  Reviewed  PROTIME-INR - Abnormal; Notable for the following components:      Result Value   Prothrombin Time 30.5 (*)    All other components within normal limits  BASIC METABOLIC PANEL - Abnormal; Notable for the following components:   BUN 61 (*)    Creatinine, Ser 7.61 (*)    Calcium 7.8 (*)    GFR calc non Af Amer 5 (*)    GFR calc Af Amer 5 (*)    All other components within normal limits  CBC WITH DIFFERENTIAL/PLATELET - Abnormal; Notable for the following components:   RDW 15.7 (*)    All other components within normal limits  APTT  RAPID URINE DRUG SCREEN, HOSP PERFORMED  URINALYSIS, ROUTINE W REFLEX MICROSCOPIC  I-STAT TROPONIN, ED    EKG None  Radiology Ct Head Wo Contrast  Result Date: 10/04/2017 CLINICAL DATA:  Pain following fall.  Renal failure. EXAM: CT HEAD WITHOUT CONTRAST TECHNIQUE: Contiguous axial images were obtained from the base of the skull through the vertex without intravenous contrast. COMPARISON:  July 26, 2011 FINDINGS: Brain: Mild diffuse atrophy is stable. There is no intracranial mass, hemorrhage, extra-axial fluid collection, or midline shift. There is slight periventricular small vessel disease. There is decreased attenuation in the mid right cerebellar hemisphere slightly inferior to the dentate nucleus on the right. This area potentially may represent a recent infarct. No other findings suggesting potential recent infarct evident. Vascular: No evident hyperdense vessel. There is  calcification in each carotid siphon and distal vertebral artery. Skull: Bony calvarium appears intact. Sinuses/Orbits: There is mucosal thickening and opacification in several ethmoid air cells. There is mucosal thickening in the posterior right sphenoid sinus. Other paranasal sinuses are clear. Orbits appear symmetric bilaterally. Other: There is opacification in several mastoid air cells on the left. Visualized mastoid air cells on the right are clear. IMPRESSION: 1. Decreased  attenuation in the right cerebellum slightly inferior to the right dentate nucleus. Question recent infarct in this area. 2.  Atrophy with slight periventricular small vessel disease. 3. No demonstrable hemorrhage. No extra-axial fluid collections are evident. 4.  There are foci of vascular calcification. 5. Areas of paranasal sinus disease as well as left-sided mastoid air cell disease. Electronically Signed   By: Lowella Grip III M.D.   On: 10/04/2017 09:10    Procedures Procedures (including critical care time)  Medications Ordered in ED Medications - No data to display   Initial Impression / Assessment and Plan / ED Course  I have reviewed the triage vital signs and the nursing notes.  Pertinent labs & imaging results that were available during my care of the patient were reviewed by me and considered in my medical decision making (see chart for details).  77 year old female presents with a mechanical fall and head injury. She is high risks since she is on Coumadin for her A.fib. Her pulse ox is reading her HR is in the 40s however cardiac monitor is reading 70s. Her pulse is consistent with HR in the 70s and EKG rate is in the 70s as well. She is hypertensive as well. O2 sats are normal and she is in no distress. On exam she does have nystagmus but has no dysmetria. She has normal strength and no obvious injuries. Will obtain labs, CT head.   CT head shows findings concerning for right cerebellar stroke. On further discussion with the patient and her daughter in law she has been having "dizzy spells" and today has had some intermittent blurry vision. Shared visit with Dr. Eulis Foster. Will obtain MRI brain as she has no history of stroke. Lab work is overall unremarkable/at baseline. Her INR is therapeutic.  MRI is negative. Discussed results with the patient and her family. They are re-scheduled for dialysis tomorrow at 12:30. They will follow up with her doctor regarding her mildly elevated  blood pressure.  Final Clinical Impressions(s) / ED Diagnoses   Final diagnoses:  Fall, initial encounter  Injury of head, initial encounter    ED Discharge Orders    None       Recardo Evangelist, PA-C 10/04/17 1318    Daleen Bo, MD 10/05/17 [phone]

## 2017-10-04 NOTE — ED Triage Notes (Signed)
Per EMS: pt from Dialysis with c/o fall.  Pt was walking into dialysis and tripped, fell backwards and landed on her bottom, hit head (on blood thinners).  Pt denies LOC.  EMS states pt is on home O2 3-4 L [place]. PTA vitals: cbg 126, BP 146, HR 102, RR 18, Sp02 100% on 3L.

## 2017-12-12 DIAGNOSIS — D510 Vitamin B12 deficiency anemia due to intrinsic factor deficiency: Secondary | ICD-10-CM | POA: Insufficient documentation

## 2017-12-12 NOTE — Progress Notes (Signed)
Cardiology Office Note   Date:  12/13/2017   ID:  [name], DOB 03-Aug-1940, MRN [id number]  PCP:  Dione Housekeeper, MD  Cardiologist:   Minus Breeding, MD   Chief Complaint  Patient presents with  . Atrial Fibrillation      History of Present Illness: [name] is a 77 y.o. female who presents for follow-up of atrial fibrillation.  Since I last saw her she fell and was in the emergency room in July at [facility].  I reviewed these records.  She did not lose consciousness.  There was a mechanical fall.  I see from her face that she is fallen since then and she says this was about 2 weeks ago.  She lost her footing tripping over something in her kitchen.  She did not seek medical care at that time although she reports she had a head CT yesterday in [place] .  She does not have these results.  Again she said she has not had any loss of consciousness.  She does have some orthostatic symptoms at times but these she says are not severe or limiting.  She goes to dialysis and she says she is only had to take a midodrine once a day and not needed to take any increased doses on dialysis days because her blood pressure is not falling that much.  She is not noticing any palpitations, presyncope or syncope.  Is not having chest pressure, neck or arm discomfort.  She gets around slowly with her walker.   Past Medical History:  Diagnosis Date  . A-fib ([place])   . Acute respiratory failure ([place]) 05/2017  . Anemia   . Arthritis   . Diabetes mellitus    "diet controlled"  . Diabetes mellitus without complication ([place])   . ESRD (end stage renal disease) ([place])    dialysis MWF  . GERD (gastroesophageal reflux disease)   . Gout   . History of blood transfusion   . HLD (hyperlipidemia)   . HOH (hard of hearing)    left ear  . Hypertension    hypotensive -since starting dialysis  . Hypothyroidism   . PAF (paroxysmal atrial fibrillation) ([place])    a. Echo 11/16:  Mild LVH, EF 55-60%, normal wall motion,  MAC, mild MR, severe LAE (49 ml/m2), mild RVE, normal RVSF, mild RAE, mild TR, PASP 24 mmHg;  CHADS2-VASc: 4 >> Coumadin followed by PCP  . Renal disorder     Past Surgical History:  Procedure Laterality Date  . AV FISTULA PLACEMENT  04/03/2012   Procedure: ARTERIOVENOUS (AV) FISTULA CREATION;  Surgeon: Elam Dutch, MD;  Location: [place];  Service: Vascular;  Laterality: Left;  creation left brachial cephalic fistula   . CARPAL TUNNEL RELEASE Left   . COLONOSCOPY W/ POLYPECTOMY    . DILATION AND CURETTAGE OF UTERUS    . EYE SURGERY Bilateral    bilateral cataract removal  . Hemodialysis  catheter Right   . IR FLUORO GUIDE CV LINE RIGHT  05/26/2017  . IR REMOVAL TUN CV CATH W/O FL  05/24/2017  . IR US GUIDE VASC ACCESS RIGHT  05/26/2017  . JOINT REPLACEMENT Bilateral    bilateral knee  . JOINT REPLACEMENT Right    shoulder  . LIGATION OF ARTERIOVENOUS  FISTULA Left 02/04/2015   Procedure: LIGATION OF BRACHIOCEPHALIC ARTERIOVENOUS  FISTULA;  Surgeon: [employer], MD;  Location: [place];  Service: Vascular;  Laterality: Left;  . PORTACATH PLACEMENT    .  REVERSE SHOULDER ARTHROPLASTY Left 01/22/2016   Procedure: LEFT REVERSE SHOULDER ARTHROPLASTY;  Surgeon: Netta Cedars, MD;  Location: [place];  Service: Orthopedics;  Laterality: Left;  . SPLIT NIGHT STUDY  07/26/2015  . STERIOD INJECTION Right 01/22/2016   Procedure: RIGHT RING FINGER STEROID INJECTION;  Surgeon: Netta Cedars, MD;  Location: [place];  Service: Orthopedics;  Laterality: Right;  . TEE WITHOUT CARDIOVERSION N/A 05/26/2017   Procedure: TRANSESOPHAGEAL ECHOCARDIOGRAM (TEE);  Surgeon: Lelon Perla, MD;  Location: [place];  Service: Cardiovascular;  Laterality: N/A;  . THROMBECTOMY BRACHIAL ARTERY Left 02/06/2015   Procedure: EVACUATION OF LEFT ARM HEMATOMA;  Surgeon: Angelia Mould, MD;  Location: [place];  Service: Vascular;  Laterality: Left;  . TOTAL KNEE ARTHROPLASTY     right knee  . TUBAL LIGATION        Current Outpatient Medications  Medication Sig Dispense Refill  . acetaminophen (TYLENOL) 500 MG tablet Take 1,000 mg by mouth as needed for mild pain or headache.    . albuterol (PROVENTIL HFA;VENTOLIN HFA) 108 (90 Base) MCG/ACT inhaler Inhale 2 puffs into the lungs every 6 (six) hours as needed for wheezing or shortness of breath.    Marland Kitchen alendronate (FOSAMAX) 70 MG tablet Take 70 mg by mouth once a week.     . budesonide-formoterol (SYMBICORT) 80-4.5 MCG/ACT inhaler Inhale 2 puffs into the lungs 2 (two) times daily. 1 Inhaler 12  . cinacalcet (SENSIPAR) 30 MG tablet Take 1 tablet (30 mg total) by mouth every Monday, Wednesday, and Friday. 60 tablet 0  . citalopram (CELEXA) 40 MG tablet Take 40 mg by mouth daily.     . colchicine 0.6 MG tablet Take 1 tablet (0.6 mg total) by mouth every 3 (three) days. Dose needs to be decreased to twice a week in dialysis patient.    Marland Kitchen doxercalciferol (HECTOROL) 4 MCG/2ML injection Inject 0.5 mLs (1 mcg total) into the vein every Monday, Wednesday, and Friday with hemodialysis. 2 mL 0  . ezetimibe (ZETIA) 10 MG tablet Take 10 mg by mouth daily.     . Ferric Citrate (AURYXIA) 1 GM 210 MG(Fe) TABS Take 2 tablets by mouth 3 (three) times daily before meals.    . gabapentin (NEURONTIN) 100 MG capsule Take 1 capsule (100 mg total) by mouth at bedtime. (Patient taking differently: Take 200 mg by mouth at bedtime. ) 30 capsule 0  . HYDROcodone-acetaminophen (NORCO) 5-325 MG tablet Take 1 tablet by mouth every 6 (six) hours as needed for moderate pain. 30 tablet 0  . levothyroxine (SYNTHROID, LEVOTHROID) 100 MCG tablet Take 100 mcg by mouth daily.      Marland Kitchen LORazepam (ATIVAN) 0.5 MG tablet TAKE ONE TABLET BY MOUTH EVERY 6 HOURS AS NEEDED FOR ANXIETY    . midodrine (PROAMATINE) 10 MG tablet Take 1 tablet (10 mg total) by mouth every Monday, Wednesday, and Friday with hemodialysis. 10 tablet 0  . multivitamin (RENA-VIT) TABS tablet Take 1 tablet by mouth daily.    .  ondansetron (ZOFRAN) 4 MG tablet Take 1 tablet (4 mg total) by mouth every 6 (six) hours as needed for nausea. 20 tablet 0  . pantoprazole (PROTONIX) 40 MG tablet TAKE ONE TABLET BY MOUTH EVERY DAY 30-60MINUTES BEFORE FIRST MEAL OF THE DAY 30 tablet 2  . ranitidine (ZANTAC) 150 MG tablet Take 150 mg by mouth 2 (two) times daily.    . Vitamin D, Ergocalciferol, (DRISDOL) 50000 UNITS CAPS Take 50,000 Units by mouth every Monday.     Marland Kitchen  warfarin (COUMADIN) 5 MG tablet Take 5-7.5 mg by mouth as directed. 5 mg on all days except on Wednesday pt takes 7.5 mg    . ACCU-CHEK AVIVA PLUS test strip      No current facility-administered medications for this visit.     Allergies:   Amoxicillin; Sulfa drugs cross reactors; Percocet [oxycodone-acetaminophen]; and Sulfa antibiotics    ROS:  Please see the history of present illness.   Otherwise, review of systems are positive for none.   All other systems are reviewed and negative.    PHYSICAL EXAM: VS:  BP 120/78   Pulse 80   Ht _0  (1.549 m)   Wt 190 lb (86.2 kg)   BMI 35.90 kg/m  , BMI Body mass index is 35.9 kg/m.  GENERAL:  Frail appearing HEENT: She has a large ecchymosis caring her face and a hematoma on the left forehead. NECK:  No jugular venous distention, waveform within normal limits, carotid upstroke brisk and symmetric, no bruits, no thyromegaly LUNGS:  Clear to auscultation bilaterally CHEST:  Unremarkable, dialysis catheter left upper chest HEART:  PMI not displaced or sustained,S1 and S2 within normal limits, no S3, no S4, no clicks, no rubs, no murmurs ABD:  Flat, positive bowel sounds normal in frequency in pitch, no bruits, no rebound, no guarding, no midline pulsatile mass, no hepatomegaly, no splenomegaly EXT:  2 plus pulses throughout, no edema, no cyanosis no clubbing   EKG:  EKG is not ordered today.       Recent Labs: 05/21/2017: B Natriuretic Peptide 667.9; TSH 0.773 05/28/2017: Magnesium 2.0 08/14/2017: ALT  28 10/04/2017: BUN 61; Creatinine, Ser 7.61; Hemoglobin 13.0; Platelets 266; Potassium 4.0; Sodium 143    Lipid Panel    Component Value Date/Time   CHOL 107 05/25/2017 0421   TRIG 88 05/25/2017 0421   HDL 32 (L) 05/25/2017 0421   CHOLHDL 3.3 05/25/2017 0421   VLDL 18 05/25/2017 0421   LDLCALC 57 05/25/2017 0421      Wt Readings from Last 3 Encounters:  12/13/17 190 lb (86.2 kg)  10/04/17 188 lb 7.9 oz (85.5 kg)  08/09/17 190 lb (86.2 kg)      Other studies Reviewed: Additional studies/ records that were reviewed today include:  [place] CT results (we called to get this report) and ED records Review of the above records demonstrates:      ASSESSMENT AND PLAN:   PAF: CHADS2-VASc=4.  She tolerates warfarin.   I had a long discussion with patient and her family member who is with her.  They understand that falling is a contraindication to anticoagulation but at this point she is about to move to a new house which they think will be easier for her to navigate and less risk of fall.  They would like to continue the anticoagulation but would let me know if she has any other episodes.   ESRD:  She tolerates this without having to increase her medications for low blood pressure.  No change in therapy.  HYPOTENSION:For now she will continue the current dose of Midodrine.  We discussed again orthostatic blood pressure precautions.    Current medicines are reviewed at length with the patient today.  The patient does not have concerns regarding medicinesPA.  The following changes have been made: None  Labs/ tests ordered today include:   None  No orders of the defined types were placed in this encounter.    Disposition:   FU with me in 6 months.  Signed, Minus Breeding, MD  12/13/2017 3:23 PM    [facility]

## 2017-12-13 ENCOUNTER — Ambulatory Visit (INDEPENDENT_AMBULATORY_CARE_PROVIDER_SITE_OTHER): Payer: Medicare Other | Admitting: Cardiology

## 2017-12-13 ENCOUNTER — Encounter: Payer: Self-pay | Admitting: Cardiology

## 2017-12-13 VITALS — BP 120/78 | HR 80 | Ht 61.0 in | Wt 190.0 lb

## 2017-12-13 DIAGNOSIS — I951 Orthostatic hypotension: Secondary | ICD-10-CM | POA: Diagnosis not present

## 2017-12-13 DIAGNOSIS — I482 Chronic atrial fibrillation, unspecified: Secondary | ICD-10-CM

## 2017-12-13 NOTE — Patient Instructions (Signed)
Medication Instructions:  The current medical regimen is effective;  continue present plan and medications.  Follow-Up: Follow up in 6 months with Dr. Hochrein.  You will receive a letter in the mail 2 months before you are due.  Please call us when you receive this letter to schedule your follow up appointment.  If you need a refill on your cardiac medications before your next appointment, please call your pharmacy.  Thank you for choosing [facility]!!       

## 2017-12-29 ENCOUNTER — Telehealth: Payer: Self-pay | Admitting: Cardiology

## 2017-12-29 NOTE — Telephone Encounter (Signed)
New Message:     Pt c/o medication issue:  1. Name of Medication: warfarin (COUMADIN) 5 MG tablet  2. How are you currently taking this medication (dosage and times per day)? Take 5-7.5 mg by mouth as directed. 5 mg on all days except on Wednesday pt takes 7.5 mg  3. Are you having a reaction (difficulty breathing--STAT)? No   4. What is your medication issue? Patient stated her PCP would like to take her off the medication

## 2017-12-29 NOTE — Telephone Encounter (Signed)
Agree.  Too high risk.  Stop the warfarin. ASA 81 mg to start

## 2017-12-29 NOTE — Telephone Encounter (Signed)
[name], verbalized understanding

## 2017-12-29 NOTE — Telephone Encounter (Signed)
Spoke with patient daughter-in-law, ok per DPR regarding patients Warfarin. PCP suggested discussing with Dr Percival Spanish stopping the Warfarin. Since patient has started Warfarin it has not consistently been therapeutic. 2 weeks ago INR 1.1 this week 3.3. Per [name] patients skin will just break open and bleed without injury. Wednesday when they left dialysis she had to pull over secondary to bleeding and she had a very hard time getting it to stop. Sunday patient had a place bleeding and it just completely stopped oozing blood yesterday. When she fell prior to last office visit she absolutely refused to go to ED. [name] realizes it is a difficult situation but is very concerned about the bleeding. Will forward to Dr Percival Spanish for review

## 2018-02-27 DIAGNOSIS — M25571 Pain in right ankle and joints of right foot: Secondary | ICD-10-CM | POA: Insufficient documentation

## 2018-02-27 DIAGNOSIS — M25572 Pain in left ankle and joints of left foot: Secondary | ICD-10-CM | POA: Insufficient documentation

## 2018-03-09 ENCOUNTER — Telehealth: Payer: Self-pay | Admitting: Cardiology

## 2018-03-09 NOTE — Telephone Encounter (Signed)
New Message:   Patient daughter calling concerning her mother, patient is at dialysis and a blister popped up on her forehead and the daughter would like to know is that a sign of bleeding. Please call patient daughter on  Her cell phone.

## 2018-03-09 NOTE — Telephone Encounter (Signed)
Spoke with daughter [name]. Patient is currently at dialysis and her blood is clotting and has been doing so for a while but they were just notified of this. They are having to change the tubing at dialysis b/c of this. She is unsure how long this has been going on.   Yesterday she had a spontaneous blister on forehead that then turned into a bruise. Daughter is concerned this is "bleeding on the brain". She has not had any falls or hit her head on anything. Daughter states she checks her from head to toe daily.   Patient was taken OFF warfarin in Sept due to skin bleeding issues but she is insistent that her mother take something b/c of the clots. She is asking about Eliquis - states her INR was never regulated on warfarin either.   Advised will send this to MD & CV[facility] to review and advise.

## 2018-03-09 NOTE — Telephone Encounter (Signed)
Attempted to return call to Baldo Ash (daughter). Unable to leave message - VM box full

## 2018-03-09 NOTE — Telephone Encounter (Signed)
I would not be able to restart a blood thinner without understanding more about the blistering and skin bleeding.  Certainly we would not start blood thinner if the family was concerned about "bleeding on the brain."  She needs to see her primary care and be cleared from these issues for blood thinner then she can see [place] in the office to talk about Eliquis.

## 2018-03-12 NOTE — Telephone Encounter (Signed)
Attempted  To call [name] - unable to leave message

## 2018-03-13 NOTE — Telephone Encounter (Signed)
Attempted to call [facility]. Phone rang, no answer, VM box full

## 2018-04-11 DIAGNOSIS — M67979 Unspecified disorder of synovium and tendon, unspecified ankle and foot: Secondary | ICD-10-CM | POA: Insufficient documentation

## 2018-04-18 ENCOUNTER — Telehealth: Payer: Self-pay | Admitting: Cardiology

## 2018-04-18 NOTE — Telephone Encounter (Signed)
Attempted to contact daughter in law. Unable to leave as mailbox is full.

## 2018-04-18 NOTE — Telephone Encounter (Signed)
New Message   Dr. Early Osmond patient's kidney doctor would like patient to have Echo done since she's at dialysis right now and patients BP keeps dropping.

## 2018-04-19 NOTE — Telephone Encounter (Signed)
SPOKE WITH DAUGHTER IN LAW. [name] STATES DR Lorrene Reid  RECOMMENDED PATIENT HAVE A ECHO SCHEDULE. PER [name] PATIENT BLOOD PRESSURE DROPS A LOT AT DIALYSIS AND AT HOME.  RN INFORMED [name] PATIENT WOULD NEED TO EVALUATE BEFORE ECHO COULD BE ORDER OR DR Grayson AN ORDER FOR [facility] DEPT TO DO ECHO. [name] STATES THAT DR Lorrene Reid TOLD HER SHE COULD NOT ORDER TES ,AND SHE FIGURED THAT PATIENT HAD TO HAVE AN APPOINTMENT. APPOINTMENT SCHEDULE FOR 04/24/18 WITH AN EXTENDER. [name] VOICED UNDERSTANDING AND AWARE OF APPT.

## 2018-04-23 NOTE — Progress Notes (Signed)
Cardiology Office Note   Date:  04/24/2018   ID:  [name], DOB 03-Dec-1940, MRN [id number]  PCP:  Dione Housekeeper, MD  Cardiologist: [facility]  Chief Complaint  Patient presents with  . Follow-up    pt c/o low BP     History of Present Illness: [name] is a 78 y.o. female who presents for ongoing assessment and management of atrial fibrillation. She has other history of ESRD on dialysis, HL, GERD, Hypothyroidism, and diet controlled DM.   Her daughter, Baldo Ash called our office on 03/09/2018 reporting that her blood was clotting too often during dialysis, having to change the tubing often, and also had a spontaneous blister on her forehead that turned into a bruise. Of note, the patient was taken off of coumadin due bleeding issues. Daughter worried that her mother was "bleeding in her brain." Dr.Hochreine responded that he would not start a blood thinner if her family was concerned about the bleeding.Marland Kitchen She was to be seen by PCP first concerning the blistering. She would be seen on follow up concerning need to start Eliquis.   She has seen nephrologist in 04/18/2018 who has requested echocardiogram due to hypotension.   Daughter brings with her copies of BP from her dialysis. She has significant drops in BP over the 4 hours she is being dialyzed. They often have to give her a dose of Midodrine during the procedure.   Past Medical History:  Diagnosis Date  . A-fib ([place])   . Acute respiratory failure ([place]) 05/2017  . Anemia   . Arthritis   . Diabetes mellitus    "diet controlled"  . Diabetes mellitus without complication ([place])   . ESRD (end stage renal disease) ([place])    dialysis MWF  . GERD (gastroesophageal reflux disease)   . Gout   . History of blood transfusion   . HLD (hyperlipidemia)   . HOH (hard of hearing)    left ear  . Hypertension    hypotensive -since starting dialysis  . Hypothyroidism   . PAF (paroxysmal atrial fibrillation) ([place])    a. Echo 11/16:  Mild  LVH, EF 55-60%, normal wall motion, MAC, mild MR, severe LAE (49 ml/m2), mild RVE, normal RVSF, mild RAE, mild TR, PASP 24 mmHg;  CHADS2-VASc: 4 >> Coumadin followed by PCP  . Renal disorder     Past Surgical History:  Procedure Laterality Date  . AV FISTULA PLACEMENT  04/03/2012   Procedure: ARTERIOVENOUS (AV) FISTULA CREATION;  Surgeon: Elam Dutch, MD;  Location: [place];  Service: Vascular;  Laterality: Left;  creation left brachial cephalic fistula   . CARPAL TUNNEL RELEASE Left   . COLONOSCOPY W/ POLYPECTOMY    . DILATION AND CURETTAGE OF UTERUS    . EYE SURGERY Bilateral    bilateral cataract removal  . Hemodialysis  catheter Right   . IR FLUORO GUIDE CV LINE RIGHT  05/26/2017  . IR REMOVAL TUN CV CATH W/O FL  05/24/2017  . IR US GUIDE VASC ACCESS RIGHT  05/26/2017  . JOINT REPLACEMENT Bilateral    bilateral knee  . JOINT REPLACEMENT Right    shoulder  . LIGATION OF ARTERIOVENOUS  FISTULA Left 02/04/2015   Procedure: LIGATION OF BRACHIOCEPHALIC ARTERIOVENOUS  FISTULA;  Surgeon: Conrad Carpenter, MD;  Location: [place];  Service: Vascular;  Laterality: Left;  . PORTACATH PLACEMENT    . REVERSE SHOULDER ARTHROPLASTY Left 01/22/2016   Procedure: LEFT REVERSE SHOULDER ARTHROPLASTY;  Surgeon: Netta Cedars, MD;  Location: [place];  Service: Orthopedics;  Laterality: Left;  . SPLIT NIGHT STUDY  07/26/2015  . STERIOD INJECTION Right 01/22/2016   Procedure: RIGHT RING FINGER STEROID INJECTION;  Surgeon: Netta Cedars, MD;  Location: [place];  Service: Orthopedics;  Laterality: Right;  . TEE WITHOUT CARDIOVERSION N/A 05/26/2017   Procedure: TRANSESOPHAGEAL ECHOCARDIOGRAM (TEE);  Surgeon: Lelon Perla, MD;  Location: [place];  Service: Cardiovascular;  Laterality: N/A;  . THROMBECTOMY BRACHIAL ARTERY Left 02/06/2015   Procedure: EVACUATION OF LEFT ARM HEMATOMA;  Surgeon: Angelia Mould, MD;  Location: [place];  Service: Vascular;  Laterality: Left;  . TOTAL KNEE ARTHROPLASTY     right  knee  . TUBAL LIGATION       Current Outpatient Medications  Medication Sig Dispense Refill  . ACCU-CHEK AVIVA PLUS test strip     . acetaminophen (TYLENOL) 500 MG tablet Take 1,000 mg by mouth as needed for mild pain or headache.    . albuterol (PROVENTIL HFA;VENTOLIN HFA) 108 (90 Base) MCG/ACT inhaler Inhale 2 puffs into the lungs every 6 (six) hours as needed for wheezing or shortness of breath.    Marland Kitchen alendronate (FOSAMAX) 70 MG tablet Take 70 mg by mouth once a week.     Marland Kitchen aspirin EC 81 MG tablet Take 81 mg by mouth daily.    . budesonide-formoterol (SYMBICORT) 80-4.5 MCG/ACT inhaler Inhale 2 puffs into the lungs 2 (two) times daily. 1 Inhaler 12  . cinacalcet (SENSIPAR) 30 MG tablet Take 1 tablet (30 mg total) by mouth every Monday, Wednesday, and Friday. 60 tablet 0  . citalopram (CELEXA) 40 MG tablet Take 40 mg by mouth daily.     . colchicine 0.6 MG tablet Take 1 tablet (0.6 mg total) by mouth every 3 (three) days. Dose needs to be decreased to twice a week in dialysis patient.    Marland Kitchen doxercalciferol (HECTOROL) 4 MCG/2ML injection Inject 0.5 mLs (1 mcg total) into the vein every Monday, Wednesday, and Friday with hemodialysis. 2 mL 0  . ezetimibe (ZETIA) 10 MG tablet Take 10 mg by mouth daily.     . Ferric Citrate (AURYXIA) 1 GM 210 MG(Fe) TABS Take 2 tablets by mouth 3 (three) times daily before meals.    . gabapentin (NEURONTIN) 100 MG capsule Take 1 capsule (100 mg total) by mouth at bedtime. (Patient taking differently: Take 200 mg by mouth at bedtime. ) 30 capsule 0  . HYDROcodone-acetaminophen (NORCO) 5-325 MG tablet Take 1 tablet by mouth every 6 (six) hours as needed for moderate pain. 30 tablet 0  . levothyroxine (SYNTHROID, LEVOTHROID) 100 MCG tablet Take 100 mcg by mouth daily.      Marland Kitchen LORazepam (ATIVAN) 0.5 MG tablet TAKE ONE TABLET BY MOUTH EVERY 6 HOURS AS NEEDED FOR ANXIETY    . [START ON 04/25/2018] midodrine (PROAMATINE) 10 MG tablet Take 1 tablet (10 mg total) by mouth  every Monday, Wednesday, and Friday with hemodialysis. Up to 6 doses on M,W,F AND TID TU AND THU 10 tablet 0  . multivitamin (RENA-VIT) TABS tablet Take 1 tablet by mouth daily.    . pantoprazole (PROTONIX) 40 MG tablet TAKE ONE TABLET BY MOUTH EVERY DAY 30-60MINUTES BEFORE FIRST MEAL OF THE DAY 30 tablet 2  . ranitidine (ZANTAC) 150 MG tablet Take 150 mg by mouth 2 (two) times daily.    . Vitamin D, Ergocalciferol, (DRISDOL) 50000 UNITS CAPS Take 50,000 Units by mouth every Monday.     . nystatin cream (MYCOSTATIN)  APPLY TO AFFECTED AREA TWICE A DAY     No current facility-administered medications for this visit.     Allergies:   Amoxicillin; Sulfa drugs cross reactors; Percocet [oxycodone-acetaminophen]; and Sulfa antibiotics    Social History:  The patient  reports that she quit smoking about 20 years ago. Her smoking use included cigarettes. She has a 0.50 pack-year smoking history. She has never used smokeless tobacco. She reports that she does not drink alcohol or use drugs.   Family History:  The patient's family history includes Cancer in her brother and sister; Diabetes in her father and sister; Heart disease in her father; Hyperlipidemia in her daughter; Hypertension in her daughter and son; Lung cancer in her sister.    ROS: All other systems are reviewed and negative. Unless otherwise mentioned in H&P    PHYSICAL EXAM: VS:  BP 127/77   Pulse 72   Ht 5' 1"  (1.549 m)   Wt 192 lb (87.1 kg)   BMI 36.28 kg/m  , BMI Body mass index is 36.28 kg/m. GEN: Well nourished, well developed, in no acute distress HEENT: normal Neck: no JVD, carotid bruits, or masses Cardiac: RRR; no murmurs, rubs, or gallops,no edema  Respiratory:  Clear to auscultation bilaterally, normal work of breathing.Wearing O2 via [place] GI: soft, nontender, nondistended, + BS MS: no deformity or atrophy. Dialysis external catheter to left chest. Skin: warm and dry, no rash Neuro:  Strength and sensation are  intact Psych: euthymic mood, full affect   EKG:  SR with PVC's Rate of 72 bpm  Recent Labs: 05/21/2017: B Natriuretic Peptide 667.9; TSH 0.773 05/28/2017: Magnesium 2.0 08/14/2017: ALT 28 10/04/2017: BUN 61; Creatinine, Ser 7.61; Hemoglobin 13.0; Platelets 266; Potassium 4.0; Sodium 143    Lipid Panel    Component Value Date/Time   CHOL 107 05/25/2017 0421   TRIG 88 05/25/2017 0421   HDL 32 (L) 05/25/2017 0421   CHOLHDL 3.3 05/25/2017 0421   VLDL 18 05/25/2017 0421   LDLCALC 57 05/25/2017 0421      Wt Readings from Last 3 Encounters:  04/24/18 192 lb (87.1 kg)  12/13/17 190 lb (86.2 kg)  10/04/17 188 lb 7.9 oz (85.5 kg)      Other studies Reviewed: Echocardiogram 2/29/2019  Left ventricle: The cavity size was normal. There was moderate   concentric hypertrophy. Systolic function was normal. The   estimated ejection fraction was in the range of 60% to 65%. Wall   motion was normal; there were no regional wall motion   abnormalities. - Aortic valve: Transvalvular velocity was within the normal range.   There was no stenosis. There was no regurgitation. - Mitral valve: Transvalvular velocity was within the normal range.   There was no evidence for stenosis. There was no regurgitation. - Left atrium: The atrium was severely dilated. - Right ventricle: The cavity size was normal. Wall thickness was   normal. Systolic function was normal. - Atrial septum: No defect or patent foramen ovale was identified. - Tricuspid valve: There was mild regurgitation. - Pulmonary arteries: Systolic pressure was within the normal   range. PA peak pressure: 26 mm Hg (S).  ASSESSMENT AND PLAN:  1. Hypotension: I have advised her to take midodrine 10 mg 1-2 hours prior to dialysis. Take the midodrine 2 hours after dialysis begins.  She can take another midodrine 4 hours after dialysis and another 4 hours later for a total of 40 mg for the day. On non-dialysis days, she is to take  midodrine TID.  This will allow a consistent BP without sudden drops during dialysis.  They will keep a record of this during her treatments to ascertain if this is helpful to prevent hypotension.   2. ESRD: Recommendations as above Will have echo completed. She here on close follow up to discuss the echo and BP response to medication changes.    Current medicines are reviewed at length with the patient today.    Labs/ tests ordered today include: None   Phill Myron. [place] Pugh, ANP, AACC   04/24/2018 12:20 PM    [facility] [address] Office [phone] Fax [phone]

## 2018-04-24 ENCOUNTER — Encounter: Payer: Self-pay | Admitting: Adult Health

## 2018-04-24 ENCOUNTER — Ambulatory Visit (INDEPENDENT_AMBULATORY_CARE_PROVIDER_SITE_OTHER): Payer: Medicare Other | Admitting: Adult Health

## 2018-04-24 VITALS — BP 127/77 | HR 72 | Ht 61.0 in | Wt 192.0 lb

## 2018-04-24 DIAGNOSIS — I519 Heart disease, unspecified: Secondary | ICD-10-CM

## 2018-04-24 DIAGNOSIS — I959 Hypotension, unspecified: Secondary | ICD-10-CM

## 2018-04-24 DIAGNOSIS — N186 End stage renal disease: Secondary | ICD-10-CM

## 2018-04-24 DIAGNOSIS — Z992 Dependence on renal dialysis: Secondary | ICD-10-CM | POA: Diagnosis not present

## 2018-04-24 MED ORDER — MIDODRINE HCL 10 MG PO TABS: 10.0000 mg | ORAL_TABLET | ORAL | 0 refills | Status: DC

## 2018-04-24 NOTE — Patient Instructions (Signed)
Medication Instructions:  TAKE MIDODRINE 2 HR BEFORE, 2 HR IN TO AND EVERY 4 HOURS THEREAFTER DIALYSIS. ON NON DIALYSIS DAYS (TU AND THU) THREE TIMES DAILY If you need a refill on your cardiac medications before your next appointment, please call your pharmacy.  Labwork: NONE ORDERED When you have your labs (blood work) drawn today and your tests are completely normal, you will receive your results only by MyChart Message (if you have MyChart) -OR-  A paper copy in the mail.  If you have any lab test that is abnormal or we need to change your treatment, we will call you to review these results.  Testing/Procedures: Echocardiogram - Your physician has requested that you have an echocardiogram. Echocardiography is a painless test that uses sound waves to create images of your heart. It provides your doctor with information about the size and shape of your heart and how well your heart's chambers and valves are working. This procedure takes approximately one hour. There are no restrictions for this procedure. This will be performed at our [facility] location - [address], [address].  Follow-Up: You will need a follow up appointment in [place], [place], [place] .  You may see Minus Breeding, MD-IN June AS PLANNED  or one of the following Advanced Practice Providers on your designated Care Team:  Jory Sims, DNP, AACC  Rosaria Ferries, PA-C   At [facility], you and your health needs are our priority.  As part of our continuing mission to provide you with exceptional heart care, we have created designated Provider Care Teams.  These Care Teams include your primary Cardiologist (physician) and Advanced Practice Providers (APPs -  Physician Assistants and Nurse Practitioners) who all work together to provide you with the care you need, when you need it.  Thank you for choosing [facility] at [facility]!!

## 2018-04-25 ENCOUNTER — Ambulatory Visit (HOSPITAL_COMMUNITY)
Admission: RE | Admit: 2018-04-25 | Discharge: 2018-04-25 | Disposition: A | Discharge status: Home / Self Care | Payer: Medicare Other | Source: Ambulatory Visit | Attending: Physician Assistant | Admitting: Physician Assistant

## 2018-04-25 ENCOUNTER — Other Ambulatory Visit (HOSPITAL_COMMUNITY): Payer: Self-pay | Admitting: Physician Assistant

## 2018-04-25 ENCOUNTER — Telehealth: Payer: Self-pay | Admitting: *Deleted

## 2018-04-25 DIAGNOSIS — M79602 Pain in left arm: Secondary | ICD-10-CM

## 2018-04-25 DIAGNOSIS — M7989 Other specified soft tissue disorders: Principal | ICD-10-CM

## 2018-04-25 NOTE — Telephone Encounter (Signed)
Call from patient' s family patient has hot red swollen left arm where she had A/V Fistula ligate approx. 4 years ago. She is at HD at present. Instructed family to have arm checked out by PCP first due to no HD access in this arm and no intervention from [facility] since 2016. Will schedule appointment if intervention from this office needed.

## 2018-04-25 NOTE — Progress Notes (Signed)
Left upper extremity venous duplex exam completed. More details please see preliminary notes on CV PROC under chart review. Result called the ordering physician's office spoke with Ms Erasmo Downer Hess(PA). Patient was instructed to follow up the regular office visit. Leyli Kevorkian H Roseann Kees(RDMS RVT) 04/25/18 1:44 PM

## 2018-04-26 NOTE — Addendum Note (Signed)
Addended by: Jacqulynn Cadet on: 04/26/2018 04:20 PM   Modules accepted: Orders

## 2018-04-28 ENCOUNTER — Encounter (HOSPITAL_COMMUNITY): Payer: Medicare Other

## 2018-04-28 ENCOUNTER — Encounter (HOSPITAL_COMMUNITY): Payer: Self-pay

## 2018-04-28 ENCOUNTER — Emergency Department (HOSPITAL_BASED_OUTPATIENT_CLINIC_OR_DEPARTMENT_OTHER): Payer: Medicare Other

## 2018-04-28 ENCOUNTER — Emergency Department (HOSPITAL_COMMUNITY)
Admission: EM | Admit: 2018-04-28 | Discharge: 2018-04-28 | Disposition: A | Discharge status: Home / Self Care | Payer: Medicare Other | Attending: Emergency Medicine | Admitting: Emergency Medicine

## 2018-04-28 DIAGNOSIS — E119 Type 2 diabetes mellitus without complications: Secondary | ICD-10-CM | POA: Insufficient documentation

## 2018-04-28 DIAGNOSIS — S5012XA Contusion of left forearm, initial encounter: Secondary | ICD-10-CM | POA: Diagnosis not present

## 2018-04-28 DIAGNOSIS — Z79899 Other long term (current) drug therapy: Secondary | ICD-10-CM | POA: Diagnosis not present

## 2018-04-28 DIAGNOSIS — R233 Spontaneous ecchymoses: Secondary | ICD-10-CM | POA: Diagnosis not present

## 2018-04-28 DIAGNOSIS — I480 Paroxysmal atrial fibrillation: Secondary | ICD-10-CM | POA: Insufficient documentation

## 2018-04-28 DIAGNOSIS — Y929 Unspecified place or not applicable: Secondary | ICD-10-CM | POA: Insufficient documentation

## 2018-04-28 DIAGNOSIS — Y939 Activity, unspecified: Secondary | ICD-10-CM | POA: Insufficient documentation

## 2018-04-28 DIAGNOSIS — E039 Hypothyroidism, unspecified: Secondary | ICD-10-CM | POA: Insufficient documentation

## 2018-04-28 DIAGNOSIS — E785 Hyperlipidemia, unspecified: Secondary | ICD-10-CM | POA: Insufficient documentation

## 2018-04-28 DIAGNOSIS — N186 End stage renal disease: Secondary | ICD-10-CM | POA: Diagnosis not present

## 2018-04-28 DIAGNOSIS — Y33XXXA Other specified events, undetermined intent, initial encounter: Secondary | ICD-10-CM | POA: Insufficient documentation

## 2018-04-28 DIAGNOSIS — Z87891 Personal history of nicotine dependence: Secondary | ICD-10-CM | POA: Insufficient documentation

## 2018-04-28 DIAGNOSIS — I120 Hypertensive chronic kidney disease with stage 5 chronic kidney disease or end stage renal disease: Secondary | ICD-10-CM | POA: Diagnosis not present

## 2018-04-28 DIAGNOSIS — R2232 Localized swelling, mass and lump, left upper limb: Secondary | ICD-10-CM | POA: Diagnosis present

## 2018-04-28 DIAGNOSIS — Z992 Dependence on renal dialysis: Secondary | ICD-10-CM

## 2018-04-28 DIAGNOSIS — Y998 Other external cause status: Secondary | ICD-10-CM | POA: Diagnosis not present

## 2018-04-28 LAB — CBC WITH DIFFERENTIAL/PLATELET
Abs Immature Granulocytes: 0.01 10*3/uL (ref 0.00–0.07)
Basophils Absolute: 0.1 10*3/uL (ref 0.0–0.1)
Basophils Relative: 1 %
Eosinophils Absolute: 0.7 10*3/uL — ABNORMAL HIGH (ref 0.0–0.5)
Eosinophils Relative: 11 %
HCT: 34.5 % — ABNORMAL LOW (ref 36.0–46.0)
Hemoglobin: 10.5 g/dL — ABNORMAL LOW (ref 12.0–15.0)
Immature Granulocytes: 0 %
Lymphocytes Relative: 23 %
Lymphs Abs: 1.5 10*3/uL (ref 0.7–4.0)
MCH: 31.5 pg (ref 26.0–34.0)
MCHC: 30.4 g/dL (ref 30.0–36.0)
MCV: 103.6 fL — ABNORMAL HIGH (ref 80.0–100.0)
Monocytes Absolute: 0.9 10*3/uL (ref 0.1–1.0)
Monocytes Relative: 13 %
Neutro Abs: 3.5 10*3/uL (ref 1.7–7.7)
Neutrophils Relative %: 52 %
Platelets: 201 10*3/uL (ref 150–400)
RBC: 3.33 MIL/uL — ABNORMAL LOW (ref 3.87–5.11)
RDW: 15.6 % — ABNORMAL HIGH (ref 11.5–15.5)
WBC: 6.7 10*3/uL (ref 4.0–10.5)
nRBC: 0 % (ref 0.0–0.2)

## 2018-04-28 LAB — BASIC METABOLIC PANEL
Anion gap: 8 (ref 5–15)
BUN: 19 mg/dL (ref 8–23)
CO2: 27 mmol/L (ref 22–32)
Calcium: 8.2 mg/dL — ABNORMAL LOW (ref 8.9–10.3)
Chloride: 104 mmol/L (ref 98–111)
Creatinine, Ser: 5.21 mg/dL — ABNORMAL HIGH (ref 0.44–1.00)
GFR calc Af Amer: 8 mL/min — ABNORMAL LOW (ref >60)
GFR calc non Af Amer: 7 mL/min — ABNORMAL LOW (ref >60)
Glucose, Bld: 82 mg/dL (ref 70–99)
Potassium: 3.9 mmol/L (ref 3.5–5.1)
Sodium: 139 mmol/L (ref 135–145)

## 2018-04-28 MED ORDER — HYDROCODONE-ACETAMINOPHEN 5-325 MG PO TABS: 1.0000 | ORAL_TABLET | Freq: Four times a day (QID) | ORAL | 0 refills | Status: DC | PRN

## 2018-04-28 MED ORDER — HYDROCODONE-ACETAMINOPHEN 5-325 MG PO TABS
1.0000 | ORAL_TABLET | Freq: Once | ORAL | Status: AC
  Administered 2018-04-28: 1 via ORAL
  Filled 2018-04-28: qty 1

## 2018-04-28 NOTE — Progress Notes (Signed)
LUE venous duplex       has been completed. Preliminary results can be found under CV proc through chart review. Zorion Nims, BS, RDMS, RVT   

## 2018-04-28 NOTE — ED Triage Notes (Signed)
Daughter in law stated, she felt something pop in her left elbow, went to [facility] and had a [place] , and sent home and said it was just a leakage from old fistula. She felt it pop again last night.

## 2018-04-28 NOTE — Discharge Instructions (Addendum)
Ultrasound seems to be consistent with a fluid collection in the left antecubital area.  Probably hematoma.  White blood cell count without evidence of infection.  However infection could develop.  Recommend close follow-up with your primary care doctor.  Call them on Monday for follow-up.  Take the hydrocodone as directed for pain.  Return for any new or worse symptoms.

## 2018-04-28 NOTE — ED Provider Notes (Signed)
[facility] EMERGENCY DEPARTMENT Provider Note   CSN: [id number] Arrival date & time: 04/28/18  1332     History   Chief Complaint Chief Complaint  Patient presents with  . Arm Pain    HPI [name] is a 78 y.o. female.  Patient with complaint of swelling and bruising and pain to the left antecubital area.  Patient has a former AV fistula there.  Which is no longer used.  She is a dialysis patient normally dialyzed Monday Wednesdays and Fridays.  She has a dialysis catheter in her left anterior chest.  She was dialyzed on Friday.  She has been undergoing dialysis for at least 3 years.  Patient was seen by her family doctor on January 22 for this.  On that day she felt a pop in that area started to get swelling.  They was referred to [facility] long and had an ultrasound done which showed no evidence of a DVT.  The swelling is increased and the pain is increased as of last night.  [facility] is followed by [facility].  No fevers.  No chest pain no shortness of breath.     Past Medical History:  Diagnosis Date  . A-fib ([place])   . Acute respiratory failure ([place]) 05/2017  . Anemia   . Arthritis   . Diabetes mellitus    "diet controlled"  . Diabetes mellitus without complication ([place])   . ESRD (end stage renal disease) ([place])    dialysis MWF  . GERD (gastroesophageal reflux disease)   . Gout   . History of blood transfusion   . HLD (hyperlipidemia)   . HOH (hard of hearing)    left ear  . Hypertension    hypotensive -since starting dialysis  . Hypothyroidism   . PAF (paroxysmal atrial fibrillation) ([place])    a. Echo 11/16:  Mild LVH, EF 55-60%, normal wall motion, MAC, mild MR, severe LAE (49 ml/m2), mild RVE, normal RVSF, mild RAE, mild TR, PASP 24 mmHg;  CHADS2-VASc: 4 >> Coumadin followed by PCP  . Renal disorder     Patient Active Problem List   Diagnosis Date Noted  . Chronic anticoagulation 06/15/2017  . MRSA (methicillin  resistant staph aureus) culture positive   . Community acquired pneumonia of right lung   . Morbid obesity due to excess calories ([place]) 05/03/2016  . Acute respiratory failure with hypoxia ([place]) 04/14/2016  . Acute bronchitis 04/13/2016  . Fever 04/12/2016  . Sepsis ([employer]) 04/12/2016  . Diabetes mellitus with complication ([place])   . Idiopathic hypotension   . Gastroesophageal reflux disease without esophagitis   . 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 Cough variant asthma vs UACS  09/01/2015  . Anemia due to vitamin B12 deficiency 04/21/2015  . Anxiety, generalized 04/21/2015  . Atrial fibrillation ([place]) 02/16/2015  . Dyslipidemia   . Anemia   . PAF (paroxysmal atrial fibrillation) ([place]) 02/04/2015  . Diabetes mellitus type 2, diet-controlled ([place])   . Hypothyroidism   . High cholesterol   . Steal syndrome dialysis vascular access ([place]) 12/03/2014  . Physical deconditioning 08/04/2013  . Hyperkalemia 07/26/2013  . CKD (chronic kidney disease) stage 5, GFR less than 15 ml/min ([facility]) 07/26/2013  . ESRD (end stage renal disease) on dialysis ([place]) 03/15/2012    Past Surgical History:  Procedure Laterality Date  . AV FISTULA PLACEMENT  04/03/2012   Procedure: ARTERIOVENOUS (AV) FISTULA CREATION;  Surgeon: Elam Dutch, MD;  Location: [facility];  Service: Vascular;  Laterality: Left;  creation left brachial cephalic fistula   . CARPAL TUNNEL RELEASE Left   . COLONOSCOPY W/ POLYPECTOMY    . DILATION AND CURETTAGE OF UTERUS    . EYE SURGERY Bilateral    bilateral cataract removal  . Hemodialysis  catheter Right   . IR FLUORO GUIDE CV LINE RIGHT  05/26/2017  . IR REMOVAL TUN CV CATH W/O FL  05/24/2017  . IR US GUIDE VASC ACCESS RIGHT  05/26/2017  . JOINT REPLACEMENT Bilateral    bilateral knee  . JOINT REPLACEMENT Right    shoulder  . LIGATION OF ARTERIOVENOUS  FISTULA Left 02/04/2015   Procedure: LIGATION OF BRACHIOCEPHALIC ARTERIOVENOUS  FISTULA;  Surgeon: Conrad [place]ville, MD;   Location: [facility];  Service: Vascular;  Laterality: Left;  . PORTACATH PLACEMENT    . REVERSE SHOULDER ARTHROPLASTY Left 01/22/2016   Procedure: LEFT REVERSE SHOULDER ARTHROPLASTY;  Surgeon: Netta Cedars, MD;  Location: [facility];  Service: Orthopedics;  Laterality: Left;  . SPLIT NIGHT STUDY  07/26/2015  . STERIOD INJECTION Right 01/22/2016   Procedure: RIGHT RING FINGER STEROID INJECTION;  Surgeon: Netta Cedars, MD;  Location: [facility];  Service: Orthopedics;  Laterality: Right;  . TEE WITHOUT CARDIOVERSION N/A 05/26/2017   Procedure: TRANSESOPHAGEAL ECHOCARDIOGRAM (TEE);  Surgeon: Lelon Perla, MD;  Location: [facility];  Service: Cardiovascular;  Laterality: N/A;  . THROMBECTOMY BRACHIAL ARTERY Left 02/06/2015   Procedure: EVACUATION OF LEFT ARM HEMATOMA;  Surgeon: Angelia Mould, MD;  Location: [facility];  Service: Vascular;  Laterality: Left;  . TOTAL KNEE ARTHROPLASTY     right knee  . TUBAL LIGATION       OB History    Gravida  0   Para  0   Term  0   Preterm  0   AB  0   Living        SAB  0   TAB  0   Ectopic  0   Multiple      Live Births               Home Medications    Prior to Admission medications   Medication Sig Start Date End Date Taking? Authorizing Provider  acetaminophen (TYLENOL) 500 MG tablet Take 1,000 mg by mouth as needed for mild pain or headache.   Yes [provider]  albuterol (PROVENTIL HFA;VENTOLIN HFA) 108 (90 Base) MCG/ACT inhaler Inhale 2 puffs into the lungs every 6 (six) hours as needed for wheezing or shortness of breath.   Yes [provider]  alendronate (FOSAMAX) 70 MG tablet Take 70 mg by mouth once a week.  08/07/17  Yes [provider]  aspirin EC 81 MG tablet Take 81 mg by mouth daily.   Yes [provider]  budesonide-formoterol (SYMBICORT) 80-4.5 MCG/ACT inhaler Inhale 2 puffs into the lungs 2 (two) times daily. 01/09/17  Yes Tanda Rockers, MD  cinacalcet (SENSIPAR) 30 MG tablet  Take 1 tablet (30 mg total) by mouth every Monday, Wednesday, and Friday. Patient taking differently: Take 30 mg by mouth See admin instructions. Mondays and fridays 05/28/17  Yes Allie Bossier, MD  citalopram (CELEXA) 40 MG tablet Take 40 mg by mouth daily.  05/29/17  Yes [provider]  colchicine 0.6 MG tablet Take 1 tablet (0.6 mg total) by mouth every 3 (three) days. Dose needs to be decreased to twice a week in dialysis patient. 04/14/16  Yes Debbe Odea, MD  doxercalciferol (HECTOROL) 4 MCG/2ML injection Inject 0.5 mLs (1 mcg total) into the vein every Monday, Wednesday, and Friday with hemodialysis. 05/29/17  Yes Allie Bossier, MD  ezetimibe (ZETIA) 10 MG tablet Take 10 mg by mouth daily.    Yes [provider]  Ferric Citrate (AURYXIA) 1 GM 210 MG(Fe) TABS Take 210 mg by mouth 3 (three) times daily before meals.    Yes [provider]  gabapentin (NEURONTIN) 100 MG capsule Take 1 capsule (100 mg total) by mouth at bedtime. Patient taking differently: Take 200 mg by mouth at bedtime.  05/28/17  Yes Allie Bossier, MD  HYDROcodone-acetaminophen (NORCO) 5-325 MG tablet Take 1 tablet by mouth every 6 (six) hours as needed for moderate pain. 01/22/16  Quentin Ore, MD  levothyroxine (SYNTHROID, LEVOTHROID) 100 MCG tablet Take 100 mcg by mouth daily.     Yes [provider]  LORazepam (ATIVAN) 0.5 MG tablet Take 0.5 mg by mouth at bedtime.  09/01/16  Yes [provider]  midodrine (PROAMATINE) 10 MG tablet Take 1 tablet (10 mg total) by mouth every Monday, Wednesday, and Friday with hemodialysis. Up to 6 doses on M,W,F AND TID TU AND THU Patient taking differently: Take 10 mg by mouth See admin instructions. Take 1 tablet by mouth 3 times a day; expected on dialysis days Monday, Wednesday and Friday every 4 hours 04/25/18  Yes Lendon Colonel, NP  multivitamin (RENA-VIT) TABS tablet Take 1 tablet by mouth daily.   Yes [provider]    nystatin cream (MYCOSTATIN) Apply 1 application topically 2 (two) times daily.  03/16/18  Yes [provider]  pantoprazole (PROTONIX) 40 MG tablet TAKE ONE TABLET BY MOUTH EVERY DAY 30-60MINUTES BEFORE FIRST MEAL OF THE DAY Patient taking differently: Take 40 mg by mouth daily.  08/09/17  Yes Tanda Rockers, MD  ranitidine (ZANTAC) 150 MG tablet Take 150 mg by mouth 2 (two) times daily. 09/18/17  Yes [provider]  Vitamin D, Ergocalciferol, (DRISDOL) 50000 UNITS CAPS Take 50,000 Units by mouth every Monday.    Yes [provider]  ACCU-CHEK AVIVA PLUS test strip  11/11/14   [provider]  HYDROcodone-acetaminophen (NORCO/VICODIN) 5-325 MG tablet Take 1 tablet by mouth every 6 (six) hours as needed for moderate pain. 04/28/18   Fredia Sorrow, MD    Family History Family History  Problem Relation Age of Onset  . Diabetes Father        before age 41  . Heart disease Father   . Diabetes Sister   . Cancer Brother   . Hyperlipidemia Daughter   . Hypertension Daughter   . Hypertension Son   . Lung cancer Sister   . Cancer Sister     Social History Social History   Tobacco Use  . Smoking status: Former Smoker    Packs/day: 0.25    Years: 2.00    Pack years: 0.50    Types: Cigarettes    Last attempt to quit: 04/04/1998    Years since quitting: 20.0  . Smokeless tobacco: Never Used  Substance Use Topics  . Alcohol use: No    Alcohol/week: 0.0 standard drinks  . Drug use: No     Allergies   Amoxicillin; Sulfa drugs cross reactors; Percocet [oxycodone-acetaminophen]; and Sulfa antibiotics   Review of Systems Review of Systems  Constitutional: Negative for chills and fever.  HENT: Negative for rhinorrhea and sore throat.   Eyes: Negative for visual disturbance.  Respiratory:  Negative for cough and shortness of breath.   Cardiovascular: Negative for chest pain and leg swelling.  Gastrointestinal: Negative for abdominal pain, diarrhea,  nausea and vomiting.  Musculoskeletal: Negative for back pain and neck pain.  Skin: Negative for rash.  Allergic/Immunologic: Positive for immunocompromised state.  Neurological: Negative for dizziness, light-headedness and headaches.  Hematological: Bruises/bleeds easily.  Psychiatric/Behavioral: Negative for confusion.     Physical Exam Updated Vital Signs BP (!) 141/81   Pulse (!) 50   Temp 98.4 F (36.9 C) (Oral)   Resp 16   SpO2 99%   Physical Exam Vitals signs and nursing note reviewed.  Constitutional:      General: She is not in acute distress.    Appearance: She is well-developed.  HENT:     Head: Normocephalic and atraumatic.  Eyes:     Conjunctiva/sclera: Conjunctivae normal.  Neck:     Musculoskeletal: Neck supple.  Cardiovascular:     Rate and Rhythm: Normal rate and regular rhythm.     Heart sounds: No murmur.  Pulmonary:     Effort: Pulmonary effort is normal. No respiratory distress.     Breath sounds: Normal breath sounds.     Comments: Dialysis catheter left anterior chest. Abdominal:     Palpations: Abdomen is soft.     Tenderness: There is no abdominal tenderness.  Musculoskeletal:     Comments: Bruising and ecchymosis to the left antecubital area.  With some swelling.  Suggestive clinically of hematoma.  Slight increased warmth.  Optically tender to palpation no pulsatile mass.  No evidence either arm or forearm compartment syndrome.  Radial pulse distally is 2+.  Good range of motion at the wrist and fingers given pretty good range of motion at the elbow and good range of motion of the shoulder.  No red streaking.  Skin:    General: Skin is warm and dry.  Neurological:     Mental Status: She is alert.      ED Treatments / Results  Labs (all labs ordered are listed, but only abnormal results are displayed) Labs Reviewed  CBC WITH DIFFERENTIAL/PLATELET - Abnormal; Notable for the following components:      Result Value   RBC 3.33 (*)     Hemoglobin 10.5 (*)    HCT 34.5 (*)    MCV 103.6 (*)    RDW 15.6 (*)    Eosinophils Absolute 0.7 (*)    All other components within normal limits  BASIC METABOLIC PANEL - Abnormal; Notable for the following components:   Creatinine, Ser 5.21 (*)    Calcium 8.2 (*)    GFR calc non Af Amer 7 (*)    GFR calc Af Amer 8 (*)    All other components within normal limits    EKG None  Radiology No results found.  Procedures Procedures (including critical care time)  Medications Ordered in ED Medications  HYDROcodone-acetaminophen (NORCO/VICODIN) 5-325 MG per tablet 1 tablet (1 tablet Oral Given 04/28/18 1704)     Initial Impression / Assessment and Plan / ED Course  I have reviewed the triage vital signs and the nursing notes.  Pertinent labs & imaging results that were available during my care of the patient were reviewed by me and considered in my medical decision making (see chart for details).     Work-up of the fluid collection in the left antecubital area probably hematoma.  No clinical evidence of infection at this time.  No leukocytosis no fevers.  Patient's electrolytes  consistent with a renal dialysis patient.  Recommend following up with primary care doctor early next week.  Return to dialysis on her normal dialysis days.  Take the hydrocodone as needed for pain.  Family is aware that hematomas can become infected.  They will watch for fever or any spreading redness.  Final Clinical Impressions(s) / ED Diagnoses   Final diagnoses:  Spontaneous hematoma of forearm  ESRD on dialysis [facility])    ED Discharge Orders         Ordered    HYDROcodone-acetaminophen (NORCO/VICODIN) 5-325 MG tablet  Every 6 hours PRN     04/28/18 1859           Fredia Sorrow, MD 05/01/18 0100

## 2018-05-01 ENCOUNTER — Ambulatory Visit (HOSPITAL_COMMUNITY): Payer: Medicare Other | Attending: Cardiology

## 2018-05-01 DIAGNOSIS — I509 Heart failure, unspecified: Secondary | ICD-10-CM | POA: Insufficient documentation

## 2018-05-01 DIAGNOSIS — E1122 Type 2 diabetes mellitus with diabetic chronic kidney disease: Secondary | ICD-10-CM | POA: Insufficient documentation

## 2018-05-01 DIAGNOSIS — Z87891 Personal history of nicotine dependence: Secondary | ICD-10-CM | POA: Insufficient documentation

## 2018-05-01 DIAGNOSIS — Z8249 Family history of ischemic heart disease and other diseases of the circulatory system: Secondary | ICD-10-CM | POA: Insufficient documentation

## 2018-05-01 DIAGNOSIS — I519 Heart disease, unspecified: Secondary | ICD-10-CM

## 2018-05-01 DIAGNOSIS — Z992 Dependence on renal dialysis: Secondary | ICD-10-CM | POA: Diagnosis present

## 2018-05-01 DIAGNOSIS — I083 Combined rheumatic disorders of mitral, aortic and tricuspid valves: Secondary | ICD-10-CM | POA: Insufficient documentation

## 2018-05-01 DIAGNOSIS — N186 End stage renal disease: Secondary | ICD-10-CM | POA: Insufficient documentation

## 2018-05-01 DIAGNOSIS — E785 Hyperlipidemia, unspecified: Secondary | ICD-10-CM | POA: Insufficient documentation

## 2018-05-01 DIAGNOSIS — I4891 Unspecified atrial fibrillation: Secondary | ICD-10-CM | POA: Diagnosis not present

## 2018-05-01 DIAGNOSIS — I132 Hypertensive heart and chronic kidney disease with heart failure and with stage 5 chronic kidney disease, or end stage renal disease: Secondary | ICD-10-CM | POA: Insufficient documentation

## 2018-05-04 ENCOUNTER — Other Ambulatory Visit: Payer: Self-pay

## 2018-05-04 DIAGNOSIS — M79602 Pain in left arm: Secondary | ICD-10-CM

## 2018-05-04 DIAGNOSIS — M7989 Other specified soft tissue disorders: Principal | ICD-10-CM

## 2018-05-07 NOTE — Progress Notes (Signed)
05-06-2018 The patient has not viewed the following result(s) yet.

## 2018-05-10 ENCOUNTER — Encounter: Payer: Self-pay | Admitting: Vascular Surgery

## 2018-05-10 ENCOUNTER — Ambulatory Visit (INDEPENDENT_AMBULATORY_CARE_PROVIDER_SITE_OTHER): Payer: Medicare Other | Admitting: Vascular Surgery

## 2018-05-10 ENCOUNTER — Ambulatory Visit (HOSPITAL_COMMUNITY)
Admission: RE | Admit: 2018-05-10 | Discharge: 2018-05-10 | Disposition: A | Discharge status: Home / Self Care | Payer: Medicare Other | Source: Ambulatory Visit | Attending: Vascular Surgery | Admitting: Vascular Surgery

## 2018-05-10 ENCOUNTER — Encounter: Payer: Self-pay | Admitting: *Deleted

## 2018-05-10 ENCOUNTER — Other Ambulatory Visit: Payer: Self-pay | Admitting: *Deleted

## 2018-05-10 ENCOUNTER — Other Ambulatory Visit: Payer: Self-pay

## 2018-05-10 VITALS — BP 122/77 | HR 67 | Temp 99.0°F | Resp 16 | Ht 61.0 in | Wt 192.0 lb

## 2018-05-10 DIAGNOSIS — M79602 Pain in left arm: Secondary | ICD-10-CM

## 2018-05-10 DIAGNOSIS — M7989 Other specified soft tissue disorders: Secondary | ICD-10-CM | POA: Diagnosis present

## 2018-05-10 DIAGNOSIS — S40022A Contusion of left upper arm, initial encounter: Secondary | ICD-10-CM | POA: Diagnosis not present

## 2018-05-10 NOTE — H&P (View-Only) (Signed)
Referring Physician: Dr. Lorrene Reid Patient name: [name] MRN: [id number] DOB: 15-Oct-1940 Sex: female  REASON FOR CONSULT: Left arm cellulitis  HPI: [name] is a 78 y.o. female, previously had a left brachiocephalic AV fistula placed in 2013.  This was subsequently ligated in 2016 for steal symptoms.  A few weeks ago she noticed some swelling and bruising around the left elbow area.  This spread all the way down to the forearm and wrist and up to the axilla.  Most of it was on the back of the arm.  She did not developed redness and warmth extending around the entire arm.  She was seen by her nephrologist Dr. Lorrene Reid and placed on IV vancomycin.  She is still currently receiving antibiotics.  The redness has improved somewhat but the patient still has swelling around the antecubital area.  Other medical problems include diabetes, end-stage renal disease Monday Wednesday Friday dialysis [facility], hyperlipidemia, Po tension (on chronic midodrine), paroxysmal atrial fibrillation.  These have all been stable.  Past Medical History:  Diagnosis Date  . A-fib ([place])   . Acute respiratory failure ([place]) 05/2017  . Anemia   . Arthritis   . Diabetes mellitus    "diet controlled"  . Diabetes mellitus without complication ([place])   . ESRD (end stage renal disease) ([place])    dialysis MWF  . GERD (gastroesophageal reflux disease)   . Gout   . History of blood transfusion   . HLD (hyperlipidemia)   . HOH (hard of hearing)    left ear  . Hypertension    hypotensive -since starting dialysis  . Hypothyroidism   . PAF (paroxysmal atrial fibrillation) ([place])    a. Echo 11/16:  Mild LVH, EF 55-60%, normal wall motion, MAC, mild MR, severe LAE (49 ml/m2), mild RVE, normal RVSF, mild RAE, mild TR, PASP 24 mmHg;  CHADS2-VASc: 4 >> Coumadin followed by PCP  . Renal disorder    Past Surgical History:  Procedure Laterality Date  . AV FISTULA PLACEMENT  04/03/2012   Procedure: ARTERIOVENOUS (AV)  FISTULA CREATION;  Surgeon: Elam Dutch, MD;  Location: [facility];  Service: Vascular;  Laterality: Left;  creation left brachial cephalic fistula   . CARPAL TUNNEL RELEASE Left   . COLONOSCOPY W/ POLYPECTOMY    . DILATION AND CURETTAGE OF UTERUS    . EYE SURGERY Bilateral    bilateral cataract removal  . Hemodialysis  catheter Right   . IR FLUORO GUIDE CV LINE RIGHT  05/26/2017  . IR REMOVAL TUN CV CATH W/O FL  05/24/2017  . IR US GUIDE VASC ACCESS RIGHT  05/26/2017  . JOINT REPLACEMENT Bilateral    bilateral knee  . JOINT REPLACEMENT Right    shoulder  . LIGATION OF ARTERIOVENOUS  FISTULA Left 02/04/2015   Procedure: LIGATION OF BRACHIOCEPHALIC ARTERIOVENOUS  FISTULA;  Surgeon: Conrad [place], MD;  Location: [facility];  Service: Vascular;  Laterality: Left;  . PORTACATH PLACEMENT    . REVERSE SHOULDER ARTHROPLASTY Left 01/22/2016   Procedure: LEFT REVERSE SHOULDER ARTHROPLASTY;  Surgeon: Netta Cedars, MD;  Location: [facility];  Service: Orthopedics;  Laterality: Left;  . SPLIT NIGHT STUDY  07/26/2015  . STERIOD INJECTION Right 01/22/2016   Procedure: RIGHT RING FINGER STEROID INJECTION;  Surgeon: Netta Cedars, MD;  Location: [facility];  Service: Orthopedics;  Laterality: Right;  . TEE WITHOUT CARDIOVERSION N/A 05/26/2017   Procedure: TRANSESOPHAGEAL ECHOCARDIOGRAM (TEE);  Surgeon: Lelon Perla, MD;  Location:  [facility];  Service: Cardiovascular;  Laterality: N/A;  . THROMBECTOMY BRACHIAL ARTERY Left 02/06/2015   Procedure: EVACUATION OF LEFT ARM HEMATOMA;  Surgeon: Angelia Mould, MD;  Location: [facility];  Service: Vascular;  Laterality: Left;  . TOTAL KNEE ARTHROPLASTY     right knee  . TUBAL LIGATION      Family History  Problem Relation Age of Onset  . Diabetes Father        before age 81  . Heart disease Father   . Diabetes Sister   . Cancer Brother   . Hyperlipidemia Daughter   . Hypertension Daughter   . Hypertension Son   . Lung cancer Sister   . Cancer Sister     SOCIAL  HISTORY: Social History   Socioeconomic History  . Marital status: Widowed    Spouse name: Not on file  . Number of children: 6  . Years of education: Not on file  . Highest education level: Not on file  Occupational History  . Not on file  Social Needs  . Financial resource strain: Not on file  . Food insecurity:    Worry: Not on file    Inability: Not on file  . Transportation needs:    Medical: Not on file    Non-medical: Not on file  Tobacco Use  . Smoking status: Former Smoker    Packs/day: 0.25    Years: 2.00    Pack years: 0.50    Types: Cigarettes    Last attempt to quit: 04/04/1998    Years since quitting: 20.1  . Smokeless tobacco: Never Used  Substance and Sexual Activity  . Alcohol use: No    Alcohol/week: 0.0 standard drinks  . Drug use: No  . Sexual activity: Not on file  Lifestyle  . Physical activity:    Days per week: Not on file    Minutes per session: Not on file  . Stress: Not on file  Relationships  . Social connections:    Talks on phone: Not on file    Gets together: Not on file    Attends religious service: Not on file    Active member of club or organization: Not on file    Attends meetings of clubs or organizations: Not on file    Relationship status: Not on file  . Intimate partner violence:    Fear of current or ex partner: Not on file    Emotionally abused: Not on file    Physically abused: Not on file    Forced sexual activity: Not on file  Other Topics Concern  . Not on file  Social History Narrative   ** Merged History Encounter **       Widowed 6 children Lives with son and daughter in law homemaker    Allergies  Allergen Reactions  . Amoxicillin Rash    Has patient had a PCN reaction causing immediate rash, facial/tongue/throat swelling, SOB or lightheadedness with hypotension:YES Has patient had a PCN reaction causing severe rash involving mucus membranes or skin necrosis: Yes Has patient had a PCN reaction that  required hospitalization No Has patient had a PCN reaction occurring within the last 10 years: Yes If all of the above answers are "NO", then may proceed with Cephalosporin use.   . Sulfa Drugs Cross Reactors Hives and Itching  . Percocet [Oxycodone-Acetaminophen] Itching and Rash    Did not happen last time she took it  . Sulfa Antibiotics Hives    Current Outpatient  Medications  Medication Sig Dispense Refill  . ACCU-CHEK AVIVA PLUS test strip     . acetaminophen (TYLENOL) 500 MG tablet Take 1,000 mg by mouth as needed for mild pain or headache.    . albuterol (PROVENTIL HFA;VENTOLIN HFA) 108 (90 Base) MCG/ACT inhaler Inhale 2 puffs into the lungs every 6 (six) hours as needed for wheezing or shortness of breath.    Marland Kitchen alendronate (FOSAMAX) 70 MG tablet Take 70 mg by mouth once a week.     Marland Kitchen aspirin EC 81 MG tablet Take 81 mg by mouth daily.    . budesonide-formoterol (SYMBICORT) 80-4.5 MCG/ACT inhaler Inhale 2 puffs into the lungs 2 (two) times daily. 1 Inhaler 12  . cinacalcet (SENSIPAR) 30 MG tablet Take 1 tablet (30 mg total) by mouth every Monday, Wednesday, and Friday. (Patient taking differently: Take 30 mg by mouth See admin instructions. Mondays and fridays) 60 tablet 0  . citalopram (CELEXA) 40 MG tablet Take 40 mg by mouth daily.     . colchicine 0.6 MG tablet Take 1 tablet (0.6 mg total) by mouth every 3 (three) days. Dose needs to be decreased to twice a week in dialysis patient.    Marland Kitchen doxercalciferol (HECTOROL) 4 MCG/2ML injection Inject 0.5 mLs (1 mcg total) into the vein every Monday, Wednesday, and Friday with hemodialysis. 2 mL 0  . ezetimibe (ZETIA) 10 MG tablet Take 10 mg by mouth daily.     . Ferric Citrate (AURYXIA) 1 GM 210 MG(Fe) TABS Take 210 mg by mouth 3 (three) times daily before meals.     . gabapentin (NEURONTIN) 100 MG capsule Take 1 capsule (100 mg total) by mouth at bedtime. (Patient taking differently: Take 200 mg by mouth at bedtime. ) 30 capsule 0  .  levothyroxine (SYNTHROID, LEVOTHROID) 100 MCG tablet Take 100 mcg by mouth daily.      Marland Kitchen LORazepam (ATIVAN) 0.5 MG tablet Take 0.5 mg by mouth at bedtime.     . midodrine (PROAMATINE) 10 MG tablet Take 1 tablet (10 mg total) by mouth every Monday, Wednesday, and Friday with hemodialysis. Up to 6 doses on M,W,F AND TID TU AND THU (Patient taking differently: Take 10 mg by mouth See admin instructions. Take 1 tablet by mouth 3 times a day; expected on dialysis days Monday, Wednesday and Friday every 4 hours) 10 tablet 0  . multivitamin (RENA-VIT) TABS tablet Take 1 tablet by mouth daily.    Marland Kitchen nystatin cream (MYCOSTATIN) Apply 1 application topically 2 (two) times daily.     . pantoprazole (PROTONIX) 40 MG tablet TAKE ONE TABLET BY MOUTH EVERY DAY 30-60MINUTES BEFORE FIRST MEAL OF THE DAY (Patient taking differently: Take 40 mg by mouth daily. ) 30 tablet 2  . ranitidine (ZANTAC) 150 MG tablet Take 150 mg by mouth 2 (two) times daily.    . vancomycin IVPB Inject into the vein 3 (three) times a week.    . Vitamin D, Ergocalciferol, (DRISDOL) 50000 UNITS CAPS Take 50,000 Units by mouth every Monday.     Marland Kitchen HYDROcodone-acetaminophen (NORCO) 5-325 MG tablet Take 1 tablet by mouth every 6 (six) hours as needed for moderate pain. (Patient not taking: Reported on 05/10/2018) 30 tablet 0  . HYDROcodone-acetaminophen (NORCO/VICODIN) 5-325 MG tablet Take 1 tablet by mouth every 6 (six) hours as needed for moderate pain. (Patient not taking: Reported on 05/10/2018) 14 tablet 0   No current facility-administered medications for this visit.     ROS:   General:  No weight loss, Fever, chills  HEENT: No recent headaches, no nasal bleeding, no visual changes, no sore throat  Neurologic: No dizziness, blackouts, seizures. No recent symptoms of stroke or mini- stroke. No recent episodes of slurred speech, or temporary blindness.  Cardiac: No recent episodes of chest pain/pressure, no shortness of breath at rest.  +  shortness of breath with exertion.  Denies history of atrial fibrillation or irregular heartbeat  Vascular: No history of rest pain in feet.  No history of claudication.  No history of non-healing ulcer, No history of DVT   Pulmonary: + home oxygen, no productive cough, no hemoptysis,  No asthma or wheezing  Musculoskeletal:  _0  Arthritis, _1  Low back pain,  _2  Joint pain  Hematologic:No history of hypercoagulable state.  No history of easy bleeding.  No history of anemia  Gastrointestinal: No hematochezia or melena,  No gastroesophageal reflux, no trouble swallowing  Urinary: _3  chronic Kidney disease, _4  on HD - _5  MWF or _6  TTHS, _7  Burning with urination, _8  Frequent urination, _9  Difficulty urinating;   Skin: No rashes  Psychological: No history of anxiety,  No history of depression   Physical Examination  Vitals:   05/10/18 1412  BP: 122/77  Pulse: 67  Resp: 16  Temp: 99 F (37.2 C)  TempSrc: Oral  SpO2: 98%  Weight: 192 lb (87.1 kg)  Height: _10  (1.549 m)    Body mass index is 36.28 kg/m.  General:  Alert and oriented, no acute distress HEENT: Normal Neck: No bruit or JVD Pulmonary: Clear to auscultation bilaterally Cardiac: Regular Rate and Rhythm  Skin: No rash, ecchymosis posterior left arm extending from the elbow up to the axilla.  There is also still some residual ecchymosis on the dorsal aspect of the forearm.  There is warmth and mild erythema in the antecubital area.  There is no pulsatile mass. Extremity Pulses: 2+ brachial absent radial pulse left arm Musculoskeletal: No deformity or edema  Neurologic: Upper and lower extremity motor 5/5 and symmetric  DATA:  Patient had a duplex ultrasound of the area of swelling which showed a about 3 cm area probably consistent with hematoma.  This was compared with ultrasound done on January 22 which had a 2 cm hematoma and a slightly larger hematoma on January 25.  ASSESSMENT: Hematoma left arm and  area adjacent to old AV fistula.  This could represent an infected anastomotic pseudoaneurysm.  Otherwise I am not really sure why she would develop a hematoma in the left arm.  The cellulitis has improved considerably although she does still have some warmth over the area suggesting infection.  PLAN: Incision and drainage hematoma left arm possible repair of pseudoaneurysm scheduled for May 14, 2018.  Risk benefits possible complications and procedure details were discussed with patient and her daughter today.  She understands and agrees to proceed.   Ruta Hinds, MD [facility] Office: [phone] Pager: [phone]

## 2018-05-10 NOTE — Progress Notes (Signed)
Referring Physician: Dr. Lorrene Reid Patient name: [name] MRN: [id number] DOB: 15-Oct-1940 Sex: female  REASON FOR CONSULT: Left arm cellulitis  HPI: [name] is a 78 y.o. female, previously had a left brachiocephalic AV fistula placed in 2013.  This was subsequently ligated in 2016 for steal symptoms.  A few weeks ago she noticed some swelling and bruising around the left elbow area.  This spread all the way down to the forearm and wrist and up to the axilla.  Most of it was on the back of the arm.  She did not developed redness and warmth extending around the entire arm.  She was seen by her nephrologist Dr. Lorrene Reid and placed on IV vancomycin.  She is still currently receiving antibiotics.  The redness has improved somewhat but the patient still has swelling around the antecubital area.  Other medical problems include diabetes, end-stage renal disease Monday Wednesday Friday dialysis [facility], hyperlipidemia, Po tension (on chronic midodrine), paroxysmal atrial fibrillation.  These have all been stable.  Past Medical History:  Diagnosis Date  . A-fib ([place])   . Acute respiratory failure ([place]) 05/2017  . Anemia   . Arthritis   . Diabetes mellitus    "diet controlled"  . Diabetes mellitus without complication ([place])   . ESRD (end stage renal disease) ([place])    dialysis MWF  . GERD (gastroesophageal reflux disease)   . Gout   . History of blood transfusion   . HLD (hyperlipidemia)   . HOH (hard of hearing)    left ear  . Hypertension    hypotensive -since starting dialysis  . Hypothyroidism   . PAF (paroxysmal atrial fibrillation) ([place])    a. Echo 11/16:  Mild LVH, EF 55-60%, normal wall motion, MAC, mild MR, severe LAE (49 ml/m2), mild RVE, normal RVSF, mild RAE, mild TR, PASP 24 mmHg;  CHADS2-VASc: 4 >> Coumadin followed by PCP  . Renal disorder    Past Surgical History:  Procedure Laterality Date  . AV FISTULA PLACEMENT  04/03/2012   Procedure: ARTERIOVENOUS (AV)  FISTULA CREATION;  Surgeon: Elam Dutch, MD;  Location: [facility];  Service: Vascular;  Laterality: Left;  creation left brachial cephalic fistula   . CARPAL TUNNEL RELEASE Left   . COLONOSCOPY W/ POLYPECTOMY    . DILATION AND CURETTAGE OF UTERUS    . EYE SURGERY Bilateral    bilateral cataract removal  . Hemodialysis  catheter Right   . IR FLUORO GUIDE CV LINE RIGHT  05/26/2017  . IR REMOVAL TUN CV CATH W/O FL  05/24/2017  . IR US GUIDE VASC ACCESS RIGHT  05/26/2017  . JOINT REPLACEMENT Bilateral    bilateral knee  . JOINT REPLACEMENT Right    shoulder  . LIGATION OF ARTERIOVENOUS  FISTULA Left 02/04/2015   Procedure: LIGATION OF BRACHIOCEPHALIC ARTERIOVENOUS  FISTULA;  Surgeon: Conrad [place], MD;  Location: [facility];  Service: Vascular;  Laterality: Left;  . PORTACATH PLACEMENT    . REVERSE SHOULDER ARTHROPLASTY Left 01/22/2016   Procedure: LEFT REVERSE SHOULDER ARTHROPLASTY;  Surgeon: Netta Cedars, MD;  Location: [facility];  Service: Orthopedics;  Laterality: Left;  . SPLIT NIGHT STUDY  07/26/2015  . STERIOD INJECTION Right 01/22/2016   Procedure: RIGHT RING FINGER STEROID INJECTION;  Surgeon: Netta Cedars, MD;  Location: [facility];  Service: Orthopedics;  Laterality: Right;  . TEE WITHOUT CARDIOVERSION N/A 05/26/2017   Procedure: TRANSESOPHAGEAL ECHOCARDIOGRAM (TEE);  Surgeon: Lelon Perla, MD;  Location:  [facility];  Service: Cardiovascular;  Laterality: N/A;  . THROMBECTOMY BRACHIAL ARTERY Left 02/06/2015   Procedure: EVACUATION OF LEFT ARM HEMATOMA;  Surgeon: Angelia Mould, MD;  Location: [facility];  Service: Vascular;  Laterality: Left;  . TOTAL KNEE ARTHROPLASTY     right knee  . TUBAL LIGATION      Family History  Problem Relation Age of Onset  . Diabetes Father        before age 81  . Heart disease Father   . Diabetes Sister   . Cancer Brother   . Hyperlipidemia Daughter   . Hypertension Daughter   . Hypertension Son   . Lung cancer Sister   . Cancer Sister     SOCIAL  HISTORY: Social History   Socioeconomic History  . Marital status: Widowed    Spouse name: Not on file  . Number of children: 6  . Years of education: Not on file  . Highest education level: Not on file  Occupational History  . Not on file  Social Needs  . Financial resource strain: Not on file  . Food insecurity:    Worry: Not on file    Inability: Not on file  . Transportation needs:    Medical: Not on file    Non-medical: Not on file  Tobacco Use  . Smoking status: Former Smoker    Packs/day: 0.25    Years: 2.00    Pack years: 0.50    Types: Cigarettes    Last attempt to quit: 04/04/1998    Years since quitting: 20.1  . Smokeless tobacco: Never Used  Substance and Sexual Activity  . Alcohol use: No    Alcohol/week: 0.0 standard drinks  . Drug use: No  . Sexual activity: Not on file  Lifestyle  . Physical activity:    Days per week: Not on file    Minutes per session: Not on file  . Stress: Not on file  Relationships  . Social connections:    Talks on phone: Not on file    Gets together: Not on file    Attends religious service: Not on file    Active member of club or organization: Not on file    Attends meetings of clubs or organizations: Not on file    Relationship status: Not on file  . Intimate partner violence:    Fear of current or ex partner: Not on file    Emotionally abused: Not on file    Physically abused: Not on file    Forced sexual activity: Not on file  Other Topics Concern  . Not on file  Social History Narrative   ** Merged History Encounter **       Widowed 6 children Lives with son and daughter in law homemaker    Allergies  Allergen Reactions  . Amoxicillin Rash    Has patient had a PCN reaction causing immediate rash, facial/tongue/throat swelling, SOB or lightheadedness with hypotension:YES Has patient had a PCN reaction causing severe rash involving mucus membranes or skin necrosis: Yes Has patient had a PCN reaction that  required hospitalization No Has patient had a PCN reaction occurring within the last 10 years: Yes If all of the above answers are "NO", then may proceed with Cephalosporin use.   . Sulfa Drugs Cross Reactors Hives and Itching  . Percocet [Oxycodone-Acetaminophen] Itching and Rash    Did not happen last time she took it  . Sulfa Antibiotics Hives    Current Outpatient  Medications  Medication Sig Dispense Refill  . ACCU-CHEK AVIVA PLUS test strip     . acetaminophen (TYLENOL) 500 MG tablet Take 1,000 mg by mouth as needed for mild pain or headache.    . albuterol (PROVENTIL HFA;VENTOLIN HFA) 108 (90 Base) MCG/ACT inhaler Inhale 2 puffs into the lungs every 6 (six) hours as needed for wheezing or shortness of breath.    Marland Kitchen alendronate (FOSAMAX) 70 MG tablet Take 70 mg by mouth once a week.     Marland Kitchen aspirin EC 81 MG tablet Take 81 mg by mouth daily.    . budesonide-formoterol (SYMBICORT) 80-4.5 MCG/ACT inhaler Inhale 2 puffs into the lungs 2 (two) times daily. 1 Inhaler 12  . cinacalcet (SENSIPAR) 30 MG tablet Take 1 tablet (30 mg total) by mouth every Monday, Wednesday, and Friday. (Patient taking differently: Take 30 mg by mouth See admin instructions. Mondays and fridays) 60 tablet 0  . citalopram (CELEXA) 40 MG tablet Take 40 mg by mouth daily.     . colchicine 0.6 MG tablet Take 1 tablet (0.6 mg total) by mouth every 3 (three) days. Dose needs to be decreased to twice a week in dialysis patient.    Marland Kitchen doxercalciferol (HECTOROL) 4 MCG/2ML injection Inject 0.5 mLs (1 mcg total) into the vein every Monday, Wednesday, and Friday with hemodialysis. 2 mL 0  . ezetimibe (ZETIA) 10 MG tablet Take 10 mg by mouth daily.     . Ferric Citrate (AURYXIA) 1 GM 210 MG(Fe) TABS Take 210 mg by mouth 3 (three) times daily before meals.     . gabapentin (NEURONTIN) 100 MG capsule Take 1 capsule (100 mg total) by mouth at bedtime. (Patient taking differently: Take 200 mg by mouth at bedtime. ) 30 capsule 0  .  levothyroxine (SYNTHROID, LEVOTHROID) 100 MCG tablet Take 100 mcg by mouth daily.      Marland Kitchen LORazepam (ATIVAN) 0.5 MG tablet Take 0.5 mg by mouth at bedtime.     . midodrine (PROAMATINE) 10 MG tablet Take 1 tablet (10 mg total) by mouth every Monday, Wednesday, and Friday with hemodialysis. Up to 6 doses on M,W,F AND TID TU AND THU (Patient taking differently: Take 10 mg by mouth See admin instructions. Take 1 tablet by mouth 3 times a day; expected on dialysis days Monday, Wednesday and Friday every 4 hours) 10 tablet 0  . multivitamin (RENA-VIT) TABS tablet Take 1 tablet by mouth daily.    Marland Kitchen nystatin cream (MYCOSTATIN) Apply 1 application topically 2 (two) times daily.     . pantoprazole (PROTONIX) 40 MG tablet TAKE ONE TABLET BY MOUTH EVERY DAY 30-60MINUTES BEFORE FIRST MEAL OF THE DAY (Patient taking differently: Take 40 mg by mouth daily. ) 30 tablet 2  . ranitidine (ZANTAC) 150 MG tablet Take 150 mg by mouth 2 (two) times daily.    . vancomycin IVPB Inject into the vein 3 (three) times a week.    . Vitamin D, Ergocalciferol, (DRISDOL) 50000 UNITS CAPS Take 50,000 Units by mouth every Monday.     Marland Kitchen HYDROcodone-acetaminophen (NORCO) 5-325 MG tablet Take 1 tablet by mouth every 6 (six) hours as needed for moderate pain. (Patient not taking: Reported on 05/10/2018) 30 tablet 0  . HYDROcodone-acetaminophen (NORCO/VICODIN) 5-325 MG tablet Take 1 tablet by mouth every 6 (six) hours as needed for moderate pain. (Patient not taking: Reported on 05/10/2018) 14 tablet 0   No current facility-administered medications for this visit.     ROS:   General:  No weight loss, Fever, chills  HEENT: No recent headaches, no nasal bleeding, no visual changes, no sore throat  Neurologic: No dizziness, blackouts, seizures. No recent symptoms of stroke or mini- stroke. No recent episodes of slurred speech, or temporary blindness.  Cardiac: No recent episodes of chest pain/pressure, no shortness of breath at rest.  +  shortness of breath with exertion.  Denies history of atrial fibrillation or irregular heartbeat  Vascular: No history of rest pain in feet.  No history of claudication.  No history of non-healing ulcer, No history of DVT   Pulmonary: + home oxygen, no productive cough, no hemoptysis,  No asthma or wheezing  Musculoskeletal:  _0  Arthritis, _1  Low back pain,  _2  Joint pain  Hematologic:No history of hypercoagulable state.  No history of easy bleeding.  No history of anemia  Gastrointestinal: No hematochezia or melena,  No gastroesophageal reflux, no trouble swallowing  Urinary: _3  chronic Kidney disease, _4  on HD - _5  MWF or _6  TTHS, _7  Burning with urination, _8  Frequent urination, _9  Difficulty urinating;   Skin: No rashes  Psychological: No history of anxiety,  No history of depression   Physical Examination  Vitals:   05/10/18 1412  BP: 122/77  Pulse: 67  Resp: 16  Temp: 99 F (37.2 C)  TempSrc: Oral  SpO2: 98%  Weight: 192 lb (87.1 kg)  Height: _10  (1.549 m)    Body mass index is 36.28 kg/m.  General:  Alert and oriented, no acute distress HEENT: Normal Neck: No bruit or JVD Pulmonary: Clear to auscultation bilaterally Cardiac: Regular Rate and Rhythm  Skin: No rash, ecchymosis posterior left arm extending from the elbow up to the axilla.  There is also still some residual ecchymosis on the dorsal aspect of the forearm.  There is warmth and mild erythema in the antecubital area.  There is no pulsatile mass. Extremity Pulses: 2+ brachial absent radial pulse left arm Musculoskeletal: No deformity or edema  Neurologic: Upper and lower extremity motor 5/5 and symmetric  DATA:  Patient had a duplex ultrasound of the area of swelling which showed a about 3 cm area probably consistent with hematoma.  This was compared with ultrasound done on January 22 which had a 2 cm hematoma and a slightly larger hematoma on January 25.  ASSESSMENT: Hematoma left arm and  area adjacent to old AV fistula.  This could represent an infected anastomotic pseudoaneurysm.  Otherwise I am not really sure why she would develop a hematoma in the left arm.  The cellulitis has improved considerably although she does still have some warmth over the area suggesting infection.  PLAN: Incision and drainage hematoma left arm possible repair of pseudoaneurysm scheduled for May 14, 2018.  Risk benefits possible complications and procedure details were discussed with patient and her daughter today.  She understands and agrees to proceed.   Ruta Hinds, MD [facility] Office: [phone] Pager: [phone]

## 2018-05-11 ENCOUNTER — Encounter (HOSPITAL_COMMUNITY): Payer: Self-pay | Admitting: *Deleted

## 2018-05-11 ENCOUNTER — Other Ambulatory Visit: Payer: Self-pay

## 2018-05-11 NOTE — Progress Notes (Signed)
Anesthesia Chart Review: Kathleene Hazel   Case:  [id number] Date/Time:  05/14/18 1215   Procedure:  EVACUATION HEMATOMA LEFT ARM WITH LOCAL AND MAC ANESTHESIA (Left )   Anesthesia type:  Monitor Anesthesia Care   Pre-op diagnosis:  HEMATOMA LEFT ARM   Location:  [facility] OR ROOM 52 / [facility]   Surgeon:  Elam Dutch, MD      DISCUSSION: Patient is a 78 year old female scheduled for the above procedure.  History includes former smoker (quit '00), PAF (diagnosed 2018; off anticoagulants due to bleeding concerns), ESRD (left brachiocephalic AVF 03/70/48, s/p ligation 02/04/15 for steal; HD MWF via left tunneled catheter), hypothyroidism, DM2 (diet controlled), GERD, HLD, HTN (hypotension with dialysis), anemia, obesity.  Anesthesia team to evaluate on the day of surgery.    PROVIDERS: Dione Housekeeper, MD is PCP - Jamal Maes, MD is nephrologist - Minus Breeding, MD is cardiologist. Last visit 04/24/18 with Jory Sims, NP.  Christinia Gully, MD is pulmonologist. Seen on 01/09/17 with PRN follow-up recommended.   LABS: Most recent lab results include: Lab Results  Component Value Date   WBC 6.7 04/28/2018   HGB 10.5 (L) 04/28/2018   HCT 34.5 (L) 04/28/2018   PLT 201 04/28/2018   GLUCOSE 82 04/28/2018   ALT 28 08/14/2017   AST 38 08/14/2017   NA 139 04/28/2018   K 3.9 04/28/2018   CL 104 04/28/2018   CREATININE 5.21 (H) 04/28/2018   BUN 19 04/28/2018   CO2 27 04/28/2018   HGBA1C 4.5 (L) 05/25/2017    - "05/03/2016   Walked RA x one lap @ 185 stopped due to  Sob no desats"  - She had normal spirometry on 09/01/15.     IMAGES: CXR 08/14/17: IMPRESSION: No active cardiopulmonary disease. No evidence of pneumonia or pulmonary edema.   EKG: EKG 04/24/18: SR with possible PACs with aberrant conduction. QT 458 ms.   CV: Echo 05/01/18: IMPRESSIONS  1. The left ventricle appears to be normal in size, has mildly increased wall thickness, with 60-65%. Echo evidence of  pseudonormal in diastolic filling patterns.  2. Right ventricular systolic pressure is moderately elevated.  3. The right ventricle is normal in size, has normal wall thickness and normal systolic function.  4. Moderately dilated left atrial size.  5. Mildly dilated right atrial size.  6. Aortic valve normal.  7. No atrial level shunt detected by color flow Doppler.  8. There is mild calcification and thickening of the mitral valve.  9. There is mild thickening and calcifications of the aortic valve. 10. Mitral valve regurgitation is mild by color flow Doppler. 11. Tricuspid regurgitation is mild. 12. The mitral valve is degenerative. 13. Moderate mitral annular calcification.   Past Medical History:  Diagnosis Date  . A-fib ([place])   . Acute respiratory failure ([place]) 05/2017  . Anemia   . Anxiety   . Arthritis   . Depression   . Diabetes mellitus    "diet controlled"  . Diabetes mellitus without complication ([place])   . ESRD (end stage renal disease) ([place])    dialysis MWF  . GERD (gastroesophageal reflux disease)   . Gout   . History of blood transfusion   . HLD (hyperlipidemia)   . HOH (hard of hearing)    left ear  . Hypertension    hypotensive -since starting dialysis  . Hypothyroidism   . PAF (paroxysmal atrial fibrillation) ([place])    a. Echo 11/16:  Mild LVH, EF  55-60%, normal wall motion, MAC, mild MR, severe LAE (49 ml/m2), mild RVE, normal RVSF, mild RAE, mild TR, PASP 24 mmHg;  CHADS2-VASc: 4 >> Coumadin followed by PCP  . Pneumonia   . Renal disorder   . Wears dentures   . Wears glasses     Past Surgical History:  Procedure Laterality Date  . AV FISTULA PLACEMENT  04/03/2012   Procedure: ARTERIOVENOUS (AV) FISTULA CREATION;  Surgeon: Elam Dutch, MD;  Location: [facility];  Service: Vascular;  Laterality: Left;  creation left brachial cephalic fistula   . CARPAL TUNNEL RELEASE Left   . COLONOSCOPY W/ POLYPECTOMY    . DILATION AND CURETTAGE OF UTERUS    . EYE  SURGERY Bilateral    bilateral cataract removal  . Hemodialysis  catheter Right   . IR FLUORO GUIDE CV LINE RIGHT  05/26/2017  . IR REMOVAL TUN CV CATH W/O FL  05/24/2017  . IR US GUIDE VASC ACCESS RIGHT  05/26/2017  . JOINT REPLACEMENT Bilateral    bilateral knee  . JOINT REPLACEMENT Right    shoulder  . LIGATION OF ARTERIOVENOUS  FISTULA Left 02/04/2015   Procedure: LIGATION OF BRACHIOCEPHALIC ARTERIOVENOUS  FISTULA;  Surgeon: Conrad Westminster, MD;  Location: [facility];  Service: Vascular;  Laterality: Left;  Marland Kitchen MULTIPLE TOOTH EXTRACTIONS    . PORTACATH PLACEMENT    . REVERSE SHOULDER ARTHROPLASTY Left 01/22/2016   Procedure: LEFT REVERSE SHOULDER ARTHROPLASTY;  Surgeon: Netta Cedars, MD;  Location: [facility];  Service: Orthopedics;  Laterality: Left;  . SPLIT NIGHT STUDY  07/26/2015  . STERIOD INJECTION Right 01/22/2016   Procedure: RIGHT RING FINGER STEROID INJECTION;  Surgeon: Netta Cedars, MD;  Location: [facility];  Service: Orthopedics;  Laterality: Right;  . TEE WITHOUT CARDIOVERSION N/A 05/26/2017   Procedure: TRANSESOPHAGEAL ECHOCARDIOGRAM (TEE);  Surgeon: Lelon Perla, MD;  Location: [facility];  Service: Cardiovascular;  Laterality: N/A;  . THROMBECTOMY BRACHIAL ARTERY Left 02/06/2015   Procedure: EVACUATION OF LEFT ARM HEMATOMA;  Surgeon: Angelia Mould, MD;  Location: [facility];  Service: Vascular;  Laterality: Left;  . TOTAL KNEE ARTHROPLASTY     right knee  . TUBAL LIGATION      MEDICATIONS: No current facility-administered medications for this encounter.    Marland Kitchen ACCU-CHEK AVIVA PLUS test strip  . acetaminophen (TYLENOL) 500 MG tablet  . albuterol (PROVENTIL HFA;VENTOLIN HFA) 108 (90 Base) MCG/ACT inhaler  . alendronate (FOSAMAX) 70 MG tablet  . aspirin EC 81 MG tablet  . budesonide-formoterol (SYMBICORT) 80-4.5 MCG/ACT inhaler  . cinacalcet (SENSIPAR) 30 MG tablet  . citalopram (CELEXA) 40 MG tablet  . colchicine 0.6 MG tablet  . doxercalciferol (HECTOROL) 4 MCG/2ML injection   . ezetimibe (ZETIA) 10 MG tablet  . Ferric Citrate (AURYXIA) 1 GM 210 MG(Fe) TABS  . gabapentin (NEURONTIN) 100 MG capsule  . HYDROcodone-acetaminophen (NORCO) 5-325 MG tablet  . HYDROcodone-acetaminophen (NORCO/VICODIN) 5-325 MG tablet  . levothyroxine (SYNTHROID, LEVOTHROID) 100 MCG tablet  . LORazepam (ATIVAN) 0.5 MG tablet  . midodrine (PROAMATINE) 10 MG tablet  . multivitamin (RENA-VIT) TABS tablet  . nystatin cream (MYCOSTATIN)  . pantoprazole (PROTONIX) 40 MG tablet  . ranitidine (ZANTAC) 150 MG tablet  . vancomycin IVPB  . Vitamin D, Ergocalciferol, (DRISDOL) 50000 UNITS CAPS    Myra Gianotti, PA-C Surgical Short Stay/Anesthesiology [facility] Phone [phone] [facility] Phone [phone] 05/11/2018 2:29 PM

## 2018-05-11 NOTE — Progress Notes (Signed)
Pt SDW-Pre-op call completed by pt daughter-in-law, Baldo Ash. Baldo Ash denies that pt C/O SOB and chest pain. Baldo Ash stated that pt is under the care of Dr. Percival Spanish, Cardiology. Baldo Ash denies that pt had a stress test and cardiac cath. Baldo Ash made aware to have pt stop taking vitamins, fish oil and herbal medications. Do not take any NSAIDs ie: Ibuprofen, Advil, Naproxen (Aleve), Motrin, BC and Goody Powder. Baldo Ash made aware to have pt check BG every 2 hours prior to arrival to hospital on DOS. Pt made aware to treat a BG < 70 with 4  ounces of apple juice, wait 15 minutes after intervention to recheck BG, if BG remains < 70, call Short Stay unit to speak with a nurse. Baldo Ash verbalized understanding of all pre-op instructions. PA, Anesthesiology, asked to review pt history.

## 2018-05-11 NOTE — Anesthesia Preprocedure Evaluation (Addendum)
Anesthesia Evaluation  Patient identified by MRN, date of birth, ID band Patient awake    Reviewed: Allergy & Precautions, NPO status , Patient's Chart, lab work & pertinent test results  Airway Mallampati: I  TM Distance: >3 FB Neck ROM: Full    Dental  (+) Edentulous Upper, Edentulous Lower, Dental Advisory Given   Pulmonary asthma , former smoker,    Pulmonary exam normal        Cardiovascular hypertension, Normal cardiovascular exam+ dysrhythmias Atrial Fibrillation   Echo 05/01/18: IMPRESSIONS  1. The left ventricle appears to be normal in size, has mildly increased wall thickness, with 60-65%. Echo evidence of pseudonormal in diastolic filling patterns.  2. Right ventricular systolic pressure is moderately elevated.  3. The right ventricle is normal in size, has normal wall thickness and normal systolic function.  4. Moderately dilated left atrial size.  5. Mildly dilated right atrial size.  6. Aortic valve normal.  7. No atrial level shunt detected by color flow Doppler.  8. There is mild calcification and thickening of the mitral valve.  9. There is mild thickening and calcifications of the aortic valve. 10. Mitral valve regurgitation is mild by color flow Doppler. 11. Tricuspid regurgitation is mild. 12. The mitral valve is degenerative. 13. Moderate mitral annular calcification.   Neuro/Psych PSYCHIATRIC DISORDERS Anxiety Depression negative neurological ROS     GI/Hepatic Neg liver ROS, GERD  ,  Endo/Other  diabetesHypothyroidism Morbid obesity  Renal/GU ESRF and DialysisRenal disease     Musculoskeletal   Abdominal   Peds  Hematology   Anesthesia Other Findings   Reproductive/Obstetrics                            Anesthesia Physical Anesthesia Plan  ASA: III  Anesthesia Plan: General   Post-op Pain Management:    Induction: Intravenous  PONV Risk Score and Plan: 3 and  Ondansetron, Dexamethasone and Diphenhydramine  Airway Management Planned: LMA  Additional Equipment:   Intra-op Plan:   Post-operative Plan: Extubation in OR  Informed Consent: I have reviewed the patients History and Physical, chart, labs and discussed the procedure including the risks, benefits and alternatives for the proposed anesthesia with the patient or authorized representative who has indicated his/her understanding and acceptance.     Dental advisory given  Plan Discussed with: Anesthesiologist and CRNA  Anesthesia Plan Comments: (PAT note written 05/11/2018 by Myra Gianotti, PA-C. )      Anesthesia Quick Evaluation                                  Anesthesia Evaluation  Patient identified by MRN, date of birth, ID band  Reviewed: Allergy & Precautions, NPO status , Patient's Chart, lab work & pertinent test results  Airway Mallampati: II  TM Distance: <3 FB Neck ROM: Full    Dental  (+) Edentulous Upper, Edentulous Lower   Pulmonary asthma , pneumonia, former smoker,    breath sounds clear to auscultation       Cardiovascular hypertension,  Rhythm:Regular Rate:Normal     Neuro/Psych    GI/Hepatic GERD  ,  Endo/Other  diabetesHypothyroidism   Renal/GU Renal disease     Musculoskeletal   Abdominal   Peds  Hematology   Anesthesia Other Findings   Reproductive/Obstetrics  Anesthesia Physical Anesthesia Plan  ASA: III  Anesthesia Plan: MAC   Post-op Pain Management:    Induction: Intravenous  PONV Risk Score and Plan: Treatment may vary due to age or medical condition  Airway Management Planned: Simple Face Mask and Mask  Additional Equipment:   Intra-op Plan:   Post-operative Plan:   Informed Consent: I have reviewed the patients History and Physical, chart, labs and discussed the procedure including the risks, benefits and alternatives for the proposed anesthesia  with the patient or authorized representative who has indicated his/her understanding and acceptance.   Dental advisory given  Plan Discussed with: CRNA, Anesthesiologist and Surgeon  Anesthesia Plan Comments:        Anesthesia Quick Evaluation

## 2018-05-14 ENCOUNTER — Encounter (HOSPITAL_COMMUNITY)
Admission: RE | Disposition: A | Discharge status: Home / Self Care | Payer: Self-pay | Source: Home / Self Care | Attending: Vascular Surgery

## 2018-05-14 ENCOUNTER — Ambulatory Visit (HOSPITAL_COMMUNITY): Payer: Medicare Other | Admitting: Vascular Surgery

## 2018-05-14 ENCOUNTER — Observation Stay (HOSPITAL_COMMUNITY)
Admission: RE | Admit: 2018-05-14 | Discharge: 2018-05-15 | Disposition: A | Discharge status: Home / Self Care | Payer: Medicare Other | Attending: Vascular Surgery | Admitting: Vascular Surgery

## 2018-05-14 ENCOUNTER — Encounter (HOSPITAL_COMMUNITY): Payer: Self-pay | Admitting: *Deleted

## 2018-05-14 ENCOUNTER — Other Ambulatory Visit: Payer: Self-pay

## 2018-05-14 DIAGNOSIS — J45909 Unspecified asthma, uncomplicated: Secondary | ICD-10-CM | POA: Insufficient documentation

## 2018-05-14 DIAGNOSIS — X58XXXA Exposure to other specified factors, initial encounter: Secondary | ICD-10-CM | POA: Diagnosis not present

## 2018-05-14 DIAGNOSIS — S46212A Strain of muscle, fascia and tendon of other parts of biceps, left arm, initial encounter: Secondary | ICD-10-CM | POA: Diagnosis not present

## 2018-05-14 DIAGNOSIS — Z7982 Long term (current) use of aspirin: Secondary | ICD-10-CM | POA: Insufficient documentation

## 2018-05-14 DIAGNOSIS — M109 Gout, unspecified: Secondary | ICD-10-CM | POA: Insufficient documentation

## 2018-05-14 DIAGNOSIS — Z96653 Presence of artificial knee joint, bilateral: Secondary | ICD-10-CM | POA: Diagnosis not present

## 2018-05-14 DIAGNOSIS — F329 Major depressive disorder, single episode, unspecified: Secondary | ICD-10-CM | POA: Insufficient documentation

## 2018-05-14 DIAGNOSIS — Z7983 Long term (current) use of bisphosphonates: Secondary | ICD-10-CM | POA: Diagnosis not present

## 2018-05-14 DIAGNOSIS — Z79899 Other long term (current) drug therapy: Secondary | ICD-10-CM | POA: Diagnosis not present

## 2018-05-14 DIAGNOSIS — N186 End stage renal disease: Secondary | ICD-10-CM | POA: Insufficient documentation

## 2018-05-14 DIAGNOSIS — E785 Hyperlipidemia, unspecified: Secondary | ICD-10-CM | POA: Insufficient documentation

## 2018-05-14 DIAGNOSIS — K219 Gastro-esophageal reflux disease without esophagitis: Secondary | ICD-10-CM | POA: Insufficient documentation

## 2018-05-14 DIAGNOSIS — S40022A Contusion of left upper arm, initial encounter: Principal | ICD-10-CM | POA: Insufficient documentation

## 2018-05-14 DIAGNOSIS — Z6836 Body mass index (BMI) 36.0-36.9, adult: Secondary | ICD-10-CM | POA: Diagnosis not present

## 2018-05-14 DIAGNOSIS — Z79890 Hormone replacement therapy (postmenopausal): Secondary | ICD-10-CM | POA: Diagnosis not present

## 2018-05-14 DIAGNOSIS — Z992 Dependence on renal dialysis: Secondary | ICD-10-CM | POA: Insufficient documentation

## 2018-05-14 DIAGNOSIS — E1122 Type 2 diabetes mellitus with diabetic chronic kidney disease: Secondary | ICD-10-CM | POA: Diagnosis not present

## 2018-05-14 DIAGNOSIS — E039 Hypothyroidism, unspecified: Secondary | ICD-10-CM | POA: Diagnosis not present

## 2018-05-14 DIAGNOSIS — Z87891 Personal history of nicotine dependence: Secondary | ICD-10-CM | POA: Diagnosis not present

## 2018-05-14 DIAGNOSIS — L03113 Cellulitis of right upper limb: Secondary | ICD-10-CM | POA: Diagnosis present

## 2018-05-14 DIAGNOSIS — Z7951 Long term (current) use of inhaled steroids: Secondary | ICD-10-CM | POA: Insufficient documentation

## 2018-05-14 DIAGNOSIS — I120 Hypertensive chronic kidney disease with stage 5 chronic kidney disease or end stage renal disease: Secondary | ICD-10-CM | POA: Insufficient documentation

## 2018-05-14 HISTORY — DX: Presence of spectacles and contact lenses: Z97.3

## 2018-05-14 HISTORY — DX: Presence of dental prosthetic device (complete) (partial): Z97.2

## 2018-05-14 HISTORY — DX: Anxiety disorder, unspecified: F41.9

## 2018-05-14 HISTORY — DX: Depression, unspecified: F32.A

## 2018-05-14 HISTORY — PX: HEMATOMA EVACUATION: SHX5118

## 2018-05-14 HISTORY — DX: Major depressive disorder, single episode, unspecified: F32.9

## 2018-05-14 HISTORY — DX: Pneumonia, unspecified organism: J18.9

## 2018-05-14 LAB — CREATININE, SERUM
Creatinine, Ser: 5.24 mg/dL — ABNORMAL HIGH (ref 0.44–1.00)
GFR calc Af Amer: 8 mL/min — ABNORMAL LOW (ref >60)
GFR calc non Af Amer: 7 mL/min — ABNORMAL LOW (ref >60)

## 2018-05-14 LAB — POCT I-STAT 4, (NA,K, GLUC, HGB,HCT)
Glucose, Bld: 80 mg/dL (ref 70–99)
HCT: 36 % (ref 36.0–46.0)
Hemoglobin: 12.2 g/dL (ref 12.0–15.0)
Potassium: 4.2 mmol/L (ref 3.5–5.1)
Sodium: 140 mmol/L (ref 135–145)

## 2018-05-14 LAB — CBC
HCT: 36.4 % (ref 36.0–46.0)
Hemoglobin: 11.1 g/dL — ABNORMAL LOW (ref 12.0–15.0)
MCH: 31.1 pg (ref 26.0–34.0)
MCHC: 30.5 g/dL (ref 30.0–36.0)
MCV: 102 fL — ABNORMAL HIGH (ref 80.0–100.0)
Platelets: 217 10*3/uL (ref 150–400)
RBC: 3.57 MIL/uL — ABNORMAL LOW (ref 3.87–5.11)
RDW: 15.4 % (ref 11.5–15.5)
WBC: 5.9 10*3/uL (ref 4.0–10.5)
nRBC: 0 % (ref 0.0–0.2)

## 2018-05-14 LAB — GLUCOSE, CAPILLARY
Glucose-Capillary: 82 mg/dL (ref 70–99)
Glucose-Capillary: 114 mg/dL — ABNORMAL HIGH (ref 70–99)
Glucose-Capillary: 69 mg/dL — ABNORMAL LOW (ref 70–99)
Glucose-Capillary: 73 mg/dL (ref 70–99)
Glucose-Capillary: 90 mg/dL (ref 70–99)
Glucose-Capillary: 107 mg/dL — ABNORMAL HIGH (ref 70–99)
Glucose-Capillary: 241 mg/dL — ABNORMAL HIGH (ref 70–99)

## 2018-05-14 SURGERY — EVACUATION HEMATOMA
Anesthesia: General | Site: Arm Upper | Laterality: Left

## 2018-05-14 MED ORDER — PROMETHAZINE HCL 25 MG/ML IJ SOLN: 6.2500 mg | INTRAMUSCULAR | Status: DC | PRN

## 2018-05-14 MED ORDER — CHLORHEXIDINE GLUCONATE 4 % EX LIQD: 60.0000 mL | Freq: Once | CUTANEOUS | Status: DC

## 2018-05-14 MED ORDER — LIDOCAINE 2% (20 MG/ML) 5 ML SYRINGE
INTRAMUSCULAR | Status: DC | PRN
  Administered 2018-05-14: 60 mg via INTRAVENOUS

## 2018-05-14 MED ORDER — PROPOFOL 10 MG/ML IV BOLUS
INTRAVENOUS | Status: AC
  Filled 2018-05-14: qty 20

## 2018-05-14 MED ORDER — SODIUM CHLORIDE 0.9 % IV SOLN
INTRAVENOUS | Status: DC | PRN
  Administered 2018-05-14: 30 ug/min via INTRAVENOUS

## 2018-05-14 MED ORDER — POTASSIUM CHLORIDE CRYS ER 20 MEQ PO TBCR
20.0000 meq | EXTENDED_RELEASE_TABLET | Freq: Once | ORAL | Status: AC
  Administered 2018-05-14: 20 meq via ORAL
  Filled 2018-05-14: qty 1

## 2018-05-14 MED ORDER — RENA-VITE PO TABS
1.0000 | ORAL_TABLET | Freq: Every day | ORAL | Status: DC
  Administered 2018-05-14 – 2018-05-15 (×2): 1 via ORAL
  Filled 2018-05-14 (×2): qty 1

## 2018-05-14 MED ORDER — CITALOPRAM HYDROBROMIDE 40 MG PO TABS
40.0000 mg | ORAL_TABLET | Freq: Every day | ORAL | Status: DC
  Administered 2018-05-15: 40 mg via ORAL
  Filled 2018-05-14: qty 1

## 2018-05-14 MED ORDER — LORAZEPAM 0.5 MG PO TABS
0.5000 mg | ORAL_TABLET | Freq: Every day | ORAL | Status: DC
  Administered 2018-05-14: 0.5 mg via ORAL
  Filled 2018-05-14: qty 1

## 2018-05-14 MED ORDER — HYDRALAZINE HCL 20 MG/ML IJ SOLN: 5.0000 mg | INTRAMUSCULAR | Status: DC | PRN

## 2018-05-14 MED ORDER — DIPHENHYDRAMINE HCL 50 MG/ML IJ SOLN
INTRAMUSCULAR | Status: DC | PRN
  Administered 2018-05-14: 12.5 mg via INTRAVENOUS

## 2018-05-14 MED ORDER — CINACALCET HCL 30 MG PO TABS: 30.0000 mg | ORAL_TABLET | ORAL | Status: DC

## 2018-05-14 MED ORDER — VANCOMYCIN HCL IN DEXTROSE 1-5 GM/200ML-% IV SOLN
1000.0000 mg | INTRAVENOUS | Status: AC
  Administered 2018-05-14: 1000 mg via INTRAVENOUS
  Filled 2018-05-14: qty 200

## 2018-05-14 MED ORDER — ALENDRONATE SODIUM 70 MG PO TABS
70.0000 mg | ORAL_TABLET | ORAL | Status: DC
Start: 2018-05-17 — End: 2018-05-15

## 2018-05-14 MED ORDER — ALUM & MAG HYDROXIDE-SIMETH 200-200-20 MG/5ML PO SUSP: 15.0000 mL | ORAL | Status: DC | PRN

## 2018-05-14 MED ORDER — FENTANYL CITRATE (PF) 100 MCG/2ML IJ SOLN
INTRAMUSCULAR | Status: DC | PRN
  Administered 2018-05-14: 100 ug via INTRAVENOUS

## 2018-05-14 MED ORDER — COLCHICINE 0.6 MG PO TABS: 0.6000 mg | ORAL_TABLET | ORAL | Status: DC

## 2018-05-14 MED ORDER — DEXAMETHASONE SODIUM PHOSPHATE 10 MG/ML IJ SOLN
INTRAMUSCULAR | Status: DC | PRN
  Administered 2018-05-14: 10 mg via INTRAVENOUS

## 2018-05-14 MED ORDER — LIDOCAINE 2% (20 MG/ML) 5 ML SYRINGE
INTRAMUSCULAR | Status: AC
  Filled 2018-05-14: qty 5

## 2018-05-14 MED ORDER — DEXTROSE 50 % IV SOLN
INTRAVENOUS | Status: AC
  Administered 2018-05-14: 25 mL via INTRAVENOUS
  Filled 2018-05-14: qty 50

## 2018-05-14 MED ORDER — DOXERCALCIFEROL 4 MCG/2ML IV SOLN: 1.0000 ug | INTRAVENOUS | Status: DC

## 2018-05-14 MED ORDER — DEXTROSE 50 % IV SOLN
25.0000 mL | Freq: Once | INTRAVENOUS | Status: AC
  Administered 2018-05-14: 25 mL via INTRAVENOUS

## 2018-05-14 MED ORDER — PANTOPRAZOLE SODIUM 40 MG PO TBEC
40.0000 mg | DELAYED_RELEASE_TABLET | Freq: Every day | ORAL | Status: DC
  Administered 2018-05-15: 40 mg via ORAL
  Filled 2018-05-14: qty 1

## 2018-05-14 MED ORDER — FENTANYL CITRATE (PF) 250 MCG/5ML IJ SOLN
INTRAMUSCULAR | Status: AC
  Filled 2018-05-14: qty 5

## 2018-05-14 MED ORDER — LABETALOL HCL 5 MG/ML IV SOLN
10.0000 mg | INTRAVENOUS | Status: DC | PRN
Start: 2018-05-14 — End: 2018-05-15

## 2018-05-14 MED ORDER — PROPOFOL 10 MG/ML IV BOLUS
INTRAVENOUS | Status: DC | PRN
  Administered 2018-05-14: 150 mg via INTRAVENOUS

## 2018-05-14 MED ORDER — EPHEDRINE SULFATE-NACL 50-0.9 MG/10ML-% IV SOSY
PREFILLED_SYRINGE | INTRAVENOUS | Status: DC | PRN
  Administered 2018-05-14 (×2): 10 mg via INTRAVENOUS

## 2018-05-14 MED ORDER — ACETAMINOPHEN 10 MG/ML IV SOLN
INTRAVENOUS | Status: AC
  Filled 2018-05-14: qty 100

## 2018-05-14 MED ORDER — EZETIMIBE 10 MG PO TABS
10.0000 mg | ORAL_TABLET | Freq: Every day | ORAL | Status: DC
  Administered 2018-05-15: 10 mg via ORAL
  Filled 2018-05-14: qty 1

## 2018-05-14 MED ORDER — ENOXAPARIN SODIUM 30 MG/0.3ML ~~LOC~~ SOLN: 30.0000 mg | SUBCUTANEOUS | Status: DC

## 2018-05-14 MED ORDER — GUAIFENESIN-DM 100-10 MG/5ML PO SYRP: 15.0000 mL | ORAL_SOLUTION | ORAL | Status: DC | PRN

## 2018-05-14 MED ORDER — ACETAMINOPHEN 500 MG PO TABS: 1000.0000 mg | ORAL_TABLET | ORAL | Status: DC | PRN

## 2018-05-14 MED ORDER — HYDROCODONE-ACETAMINOPHEN 5-325 MG PO TABS
1.0000 | ORAL_TABLET | ORAL | Status: DC | PRN
  Administered 2018-05-14: 1 via ORAL
  Filled 2018-05-14: qty 1

## 2018-05-14 MED ORDER — METOPROLOL TARTRATE 5 MG/5ML IV SOLN
2.0000 mg | INTRAVENOUS | Status: DC | PRN
Start: 2018-05-14 — End: 2018-05-15

## 2018-05-14 MED ORDER — GABAPENTIN 100 MG PO CAPS
200.0000 mg | ORAL_CAPSULE | Freq: Every day | ORAL | Status: DC
  Administered 2018-05-14: 200 mg via ORAL
  Filled 2018-05-14: qty 2

## 2018-05-14 MED ORDER — ASPIRIN EC 81 MG PO TBEC
81.0000 mg | DELAYED_RELEASE_TABLET | Freq: Every day | ORAL | Status: DC
  Administered 2018-05-14 – 2018-05-15 (×2): 81 mg via ORAL
  Filled 2018-05-14 (×2): qty 1

## 2018-05-14 MED ORDER — PHENOL 1.4 % MT LIQD: 1.0000 | OROMUCOSAL | Status: DC | PRN

## 2018-05-14 MED ORDER — ONDANSETRON HCL 4 MG/2ML IJ SOLN
INTRAMUSCULAR | Status: DC | PRN
  Administered 2018-05-14: 4 mg via INTRAVENOUS

## 2018-05-14 MED ORDER — SODIUM CHLORIDE 0.9 % IV SOLN: 250.0000 mL | INTRAVENOUS | Status: DC | PRN

## 2018-05-14 MED ORDER — PROPOFOL 1000 MG/100ML IV EMUL
INTRAVENOUS | Status: AC
  Filled 2018-05-14: qty 100

## 2018-05-14 MED ORDER — SODIUM CHLORIDE 0.9% FLUSH: 3.0000 mL | INTRAVENOUS | Status: DC | PRN

## 2018-05-14 MED ORDER — SODIUM CHLORIDE 0.9 % IR SOLN
Status: DC | PRN
  Administered 2018-05-14: 1000 mL

## 2018-05-14 MED ORDER — FERRIC CITRATE 1 GM 210 MG(FE) PO TABS
210.0000 mg | ORAL_TABLET | Freq: Three times a day (TID) | ORAL | Status: DC
  Administered 2018-05-14: 210 mg via ORAL
  Filled 2018-05-14 (×2): qty 1

## 2018-05-14 MED ORDER — SODIUM CHLORIDE 0.9 % IV SOLN
INTRAVENOUS | Status: DC
  Administered 2018-05-14: 11:00:00 via INTRAVENOUS

## 2018-05-14 MED ORDER — NYSTATIN 100000 UNIT/GM EX CREA
1.0000 "application " | TOPICAL_CREAM | Freq: Two times a day (BID) | CUTANEOUS | Status: DC
  Administered 2018-05-14 – 2018-05-15 (×2): 1 via TOPICAL
  Filled 2018-05-14: qty 15

## 2018-05-14 MED ORDER — LEVOTHYROXINE SODIUM 100 MCG PO TABS
100.0000 ug | ORAL_TABLET | Freq: Every day | ORAL | Status: DC
  Administered 2018-05-15: 100 ug via ORAL
  Filled 2018-05-14 (×2): qty 1

## 2018-05-14 MED ORDER — ONDANSETRON HCL 4 MG/2ML IJ SOLN: 4.0000 mg | Freq: Four times a day (QID) | INTRAMUSCULAR | Status: DC | PRN

## 2018-05-14 MED ORDER — ALBUTEROL SULFATE (2.5 MG/3ML) 0.083% IN NEBU: 2.5000 mg | INHALATION_SOLUTION | Freq: Four times a day (QID) | RESPIRATORY_TRACT | Status: DC | PRN

## 2018-05-14 MED ORDER — FLUTICASONE FUROATE-VILANTEROL 100-25 MCG/INH IN AEPB
1.0000 | INHALATION_SPRAY | Freq: Every day | RESPIRATORY_TRACT | Status: DC
  Filled 2018-05-14: qty 28

## 2018-05-14 MED ORDER — EPHEDRINE 5 MG/ML INJ
INTRAVENOUS | Status: AC
  Filled 2018-05-14: qty 10

## 2018-05-14 MED ORDER — ACETAMINOPHEN 10 MG/ML IV SOLN
INTRAVENOUS | Status: DC | PRN
  Administered 2018-05-14: 1000 mg via INTRAVENOUS

## 2018-05-14 MED ORDER — FENTANYL CITRATE (PF) 100 MCG/2ML IJ SOLN: 25.0000 ug | INTRAMUSCULAR | Status: DC | PRN

## 2018-05-14 MED ORDER — SODIUM CHLORIDE 0.9% FLUSH
3.0000 mL | Freq: Two times a day (BID) | INTRAVENOUS | Status: DC
  Administered 2018-05-14 – 2018-05-15 (×2): 3 mL via INTRAVENOUS

## 2018-05-14 SURGICAL SUPPLY — 47 items
ADH SKN CLS APL DERMABOND .7 (GAUZE/BANDAGES/DRESSINGS)
BANDAGE ACE 4X5 VEL STRL LF (GAUZE/BANDAGES/DRESSINGS) ×1 IMPLANT
BANDAGE ELASTIC 4 VELCRO ST LF (GAUZE/BANDAGES/DRESSINGS) ×1 IMPLANT
BANDAGE ESMARK 6X9 LF (GAUZE/BANDAGES/DRESSINGS) IMPLANT
BNDG CMPR 9X6 STRL LF SNTH (GAUZE/BANDAGES/DRESSINGS)
BNDG ESMARK 6X9 LF (GAUZE/BANDAGES/DRESSINGS)
BNDG GAUZE ELAST 4 BULKY (GAUZE/BANDAGES/DRESSINGS) ×2 IMPLANT
CANISTER SUCT 3000ML PPV (MISCELLANEOUS) ×2 IMPLANT
CONT SPEC 4OZ CLIKSEAL STRL BL (MISCELLANEOUS) ×1 IMPLANT
COVER WAND RF STERILE (DRAPES) ×2 IMPLANT
CUFF TOURNIQUET SINGLE 18IN (TOURNIQUET CUFF) IMPLANT
CUFF TOURNIQUET SINGLE 24IN (TOURNIQUET CUFF) IMPLANT
CUFF TOURNIQUET SINGLE 34IN LL (TOURNIQUET CUFF) IMPLANT
CUFF TOURNIQUET SINGLE 44IN (TOURNIQUET CUFF) IMPLANT
DERMABOND ADVANCED (GAUZE/BANDAGES/DRESSINGS)
DERMABOND ADVANCED .7 DNX12 (GAUZE/BANDAGES/DRESSINGS) IMPLANT
DRAIN SNY 10X20 3/4 PERF (WOUND CARE) IMPLANT
DRAPE ORTHO SPLIT 77X108 STRL (DRAPES)
DRAPE SURG ORHT 6 SPLT 77X108 (DRAPES) IMPLANT
ELECT REM PT RETURN 9FT ADLT (ELECTROSURGICAL) ×2
ELECTRODE REM PT RTRN 9FT ADLT (ELECTROSURGICAL) ×1 IMPLANT
EVACUATOR SILICONE 100CC (DRAIN) IMPLANT
GAUZE SPONGE 4X4 12PLY STRL LF (GAUZE/BANDAGES/DRESSINGS) ×1 IMPLANT
GLOVE BIO SURGEON STRL SZ7.5 (GLOVE) ×2 IMPLANT
GOWN STRL REUS W/ TWL LRG LVL3 (GOWN DISPOSABLE) ×3 IMPLANT
GOWN STRL REUS W/TWL LRG LVL3 (GOWN DISPOSABLE) ×6
KIT BASIN OR (CUSTOM PROCEDURE TRAY) ×2 IMPLANT
KIT TURNOVER KIT B (KITS) ×2 IMPLANT
NS IRRIG 1000ML POUR BTL (IV SOLUTION) ×4 IMPLANT
PACK CV ACCESS (CUSTOM PROCEDURE TRAY) IMPLANT
PACK GENERAL/GYN (CUSTOM PROCEDURE TRAY) IMPLANT
PACK PERIPHERAL VASCULAR (CUSTOM PROCEDURE TRAY) IMPLANT
PACK UNIVERSAL I (CUSTOM PROCEDURE TRAY) IMPLANT
PAD ARMBOARD 7.5X6 YLW CONV (MISCELLANEOUS) ×4 IMPLANT
SPONGE SURGIFOAM ABS GEL 100 (HEMOSTASIS) IMPLANT
STAPLER VISISTAT 35W (STAPLE) IMPLANT
SUT ETHILON 3 0 PS 1 (SUTURE) IMPLANT
SUT PROLENE 5 0 C 1 24 (SUTURE) IMPLANT
SUT PROLENE 6 0 CC (SUTURE) IMPLANT
SUT VIC AB 2-0 CTX 36 (SUTURE) IMPLANT
SUT VIC AB 3-0 SH 27 (SUTURE) ×2
SUT VIC AB 3-0 SH 27X BRD (SUTURE) IMPLANT
SWAB COLLECTION DEVICE MRSA (MISCELLANEOUS) ×1 IMPLANT
SWAB CULTURE LIQUID MINI MALE (MISCELLANEOUS) ×1 IMPLANT
TOWEL GREEN STERILE (TOWEL DISPOSABLE) ×2 IMPLANT
UNDERPAD 30X30 (UNDERPADS AND DIAPERS) ×2 IMPLANT
WATER STERILE IRR 1000ML POUR (IV SOLUTION) ×2 IMPLANT

## 2018-05-14 NOTE — Transfer of Care (Signed)
Immediate Anesthesia Transfer of Care Note  Patient: [name]  Procedure(s) Performed: Incision and Drainage of Left Arm Hematoma (Left Arm Upper)  Patient Location: PACU  Anesthesia Type:General  Level of Consciousness: awake and patient cooperative  Airway & Oxygen Therapy: Patient Spontanous Breathing and Patient connected to face mask oxygen  Post-op Assessment: Report given to RN and Post -op Vital signs reviewed and stable  Post vital signs: Reviewed and stable  Last Vitals:  Vitals Value Taken Time  BP 143/93 05/14/2018  4:49 PM  Temp    Pulse 87 05/14/2018  4:52 PM  Resp 9 05/14/2018  4:52 PM  SpO2 99 % 05/14/2018  4:52 PM  Vitals shown include unvalidated device data.  Last Pain:  Vitals:   05/14/18 1650  TempSrc:   PainSc: (P) Asleep      Patients Stated Pain Goal: 4 (29/24/46 2863)  Complications: No apparent anesthesia complications

## 2018-05-14 NOTE — Op Note (Signed)
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  The patient was placed in supine position operating table.  After induction of general anesthesia and placement of laryngeal mask patient's entire left upper extremities prepped and draped in usual sterile fashion.  Ultrasound was used to localize a fluid-filled cavity over the anteromedial aspect of the left arm.  An incision was then made in this location carried down through the subcutaneous tissues and into the fascia.  Hematoma cavity was entered.  This was thoroughly irrigated with normal saline solution.  As I tried to evacuate the contents of the hematoma it was noted that she had transection of what appeared to be possibly the biceps muscle.  Devitalized tissue was debrided away.  The wound was thoroughly irrigated normal saline solution.  Reasonable hemostasis was obtained.  Cultures were also sent from the hematoma cavity.  The fascial layer and the subcutaneous tissues were reapproximated using running 3-0 Vicryl suture.  The skin was left open and a wet-to-dry dressing applied as well as a sterile dressing and Ace wrap.  Patient tolerated procedure well and there were no complications.  The instrument sponge and needle count was correct the end of the case.  Patient was taken to recovery in stable condition.  Ruta Hinds, MD [facility] Office: [phone] Pager: [phone]

## 2018-05-14 NOTE — Anesthesia Postprocedure Evaluation (Signed)
Anesthesia Post Note  Patient: [name]  Procedure(s) Performed: Incision and Drainage of Left Arm Hematoma (Left Arm Upper)     Patient location during evaluation: PACU Anesthesia Type: General Level of consciousness: sedated Pain management: pain level controlled Vital Signs Assessment: post-procedure vital signs reviewed and stable Respiratory status: spontaneous breathing and respiratory function stable Cardiovascular status: stable Postop Assessment: no apparent nausea or vomiting Anesthetic complications: no    Last Vitals:  Vitals:   05/14/18 1745 05/14/18 1822  BP:  112/75  Pulse: 80 74  Resp: 13 18  Temp:  36.9 C  SpO2: 98% 99%    Last Pain:  Vitals:   05/14/18 1822  TempSrc: Oral  PainSc:                  Dorrine Montone DANIEL

## 2018-05-14 NOTE — Interval H&P Note (Signed)
History and Physical Interval Note:  05/14/2018 3:18 PM  [name]  has presented today for surgery, with the diagnosis of HEMATOMA LEFT ARM  The various methods of treatment have been discussed with the patient and family. After consideration of risks, benefits and other options for treatment, the patient has consented to  Procedure(s): EVACUATION HEMATOMA LEFT ARM WITH LOCAL AND MAC ANESTHESIA (Left) as a surgical intervention .  The patient's history has been reviewed, patient examined, no change in status, stable for surgery.  I have reviewed the patient's chart and labs.  Questions were answered to the patient's satisfaction.     Ruta Hinds

## 2018-05-14 NOTE — Anesthesia Procedure Notes (Signed)
Procedure Name: LMA Insertion Date/Time: 05/14/2018 4:05 PM Performed by: Lance Coon, CRNA Pre-anesthesia Checklist: Patient identified, Emergency Drugs available, Suction available, Patient being monitored and Timeout performed Patient Re-evaluated:Patient Re-evaluated prior to induction Oxygen Delivery Method: Circle system utilized Preoxygenation: Pre-oxygenation with 100% oxygen Induction Type: IV induction LMA: LMA with gastric port inserted LMA Size: 4.0 Number of attempts: 1 Placement Confirmation: positive ETCO2 and breath sounds checked- equal and bilateral Tube secured with: Tape Dental Injury: Teeth and Oropharynx as per pre-operative assessment

## 2018-05-14 NOTE — Progress Notes (Signed)
New Admission Note:   Arrival Method: Bed  Mental Orientation: alert and oriented  Telemetry: Box 9  Assessment: Completed Skin: incision on left arm compression  IV: Right  Pain: 7/10  Tubes: Safety Measures: Safety Fall Prevention Plan has been given, discussed and signed Admission: Completed [facility] Orientation: Patient has been orientated to the room, unit and staff.  Family: family at the bed side   Orders have been reviewed and implemented. Will continue to monitor the patient. Call light has been placed within reach and bed alarm has been activated.   Khiyan Crace RN [facility] Phone: [phone]

## 2018-05-15 ENCOUNTER — Other Ambulatory Visit: Payer: Self-pay

## 2018-05-15 ENCOUNTER — Encounter (HOSPITAL_COMMUNITY): Payer: Self-pay | Admitting: Vascular Surgery

## 2018-05-15 DIAGNOSIS — S40022A Contusion of left upper arm, initial encounter: Secondary | ICD-10-CM | POA: Diagnosis not present

## 2018-05-15 LAB — GLUCOSE, CAPILLARY: Glucose-Capillary: 97 mg/dL (ref 70–99)

## 2018-05-15 LAB — MRSA PCR SCREENING: MRSA by PCR: NEGATIVE

## 2018-05-15 MED ORDER — IPRATROPIUM-ALBUTEROL 0.5-2.5 (3) MG/3ML IN SOLN
RESPIRATORY_TRACT | Status: AC
  Filled 2018-05-15: qty 3

## 2018-05-15 NOTE — Progress Notes (Signed)
[facility]  Subjective  - Doing well over all.   Objective 107/74 77 97.7 F (36.5 C) (Oral) 17 99%  Intake/Output Summary (Last 24 hours) at 05/15/2018 0728 Last data filed at 05/15/2018 0428 Gross per 24 hour  Intake 500 ml  Output 5 ml  Net 495 ml    Left UE wet to dry dressing changed, skin surrounding the area without erythema and no pur lent drainage. 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  [facility] RN request sent to the office to set up for wound care. F/U with Dr. Oneida Alar in 2-3 weeks Disposition stable for discharge home today.  Roxy Horseman 05/15/2018 7:28 AM --  Laboratory Lab Results: Recent Labs    05/14/18 1037 05/14/18 1840  WBC  --  5.9  HGB 12.2 11.1*  HCT 36.0 36.4  PLT  --  217   BMET Recent Labs    05/14/18 1037 05/14/18 1840  NA 140  --   K 4.2  --   GLUCOSE 80  --   CREATININE  --  5.24*    COAG Lab Results  Component Value Date   INR 2.95 10/04/2017   INR 1.77 08/14/2017   INR 1.88 05/28/2017   No results found for: PTT

## 2018-05-15 NOTE — Progress Notes (Signed)
Patient discharged to home. Patient AVS reviewed and signed. Patient capable re-verbalizing medications and follow-up appointments. IV removed. Patient belongings sent with patient. Patient educated to return to the ED in the event of SOB, chest pain or dizziness.   Debi Cousin B. RN 

## 2018-05-16 NOTE — Discharge Summary (Signed)
[facility] Discharge Summary   Patient ID:  [name] MRN: [id number] DOB/AGE: 12/30/1940 78 y.o.  Admit date: 05/14/2018 Discharge date: 05/15/2018 Date of Surgery: 05/14/2018 Surgeon: Surgeon(s): Fields, Jessy Oto, MD  Admission Diagnosis: HEMATOMA LEFT ARM  Discharge Diagnoses:  HEMATOMA LEFT ARM  Secondary Diagnoses: Past Medical History:  Diagnosis Date  . A-fib ([place])   . Acute respiratory failure ([place]) 05/2017  . Anemia   . Anxiety   . Arthritis   . Depression   . Diabetes mellitus    "diet controlled"  . Diabetes mellitus without complication ([place])   . ESRD (end stage renal disease) ([place])    dialysis MWF  . GERD (gastroesophageal reflux disease)   . Gout   . History of blood transfusion   . HLD (hyperlipidemia)   . HOH (hard of hearing)    left ear  . Hypertension    hypotensive -since starting dialysis  . Hypothyroidism   . PAF (paroxysmal atrial fibrillation) ([place])    a. Echo 11/16:  Mild LVH, EF 55-60%, normal wall motion, MAC, mild MR, severe LAE (49 ml/m2), mild RVE, normal RVSF, mild RAE, mild TR, PASP 24 mmHg;  CHADS2-VASc: 4 >> Coumadin followed by PCP  . Pneumonia   . Renal disorder   . Wears dentures   . Wears glasses     Procedure(s): Incision and Drainage of Left Arm Hematoma  Discharged Condition: stable  HPI:  [name] is a 78 y.o. female, previously had a left brachiocephalic AV fistula placed in 2013.  This was subsequently ligated in 2016 for steal symptoms.  A few weeks ago she noticed some swelling and bruising around the left elbow area.  This spread all the way down to the forearm and wrist and up to the axilla.  Most of it was on the back of the arm.  She did not developed redness and warmth extending around the entire arm.  She was seen by her nephrologist Dr. Lorrene Reid and placed on IV vancomycin.  She is still currently receiving antibiotics.  The redness has improved somewhat but the patient still has swelling  around the antecubital area.  Other medical problems include diabetes, end-stage renal disease Monday Wednesday Friday dialysis [facility], hyperlipidemia, Po tension (on chronic midodrine), paroxysmal atrial fibrillation.  These have all been stable.  Hospital Course:  [name] is a 78 y.o. 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   Plan Hydrogel wet to dry dressing changes daily.  [facility] RN request sent to the office to set up for wound care. F/U with Dr. Oneida Alar in 2-3 weeks Disposition stable for discharge home today.   Significant Diagnostic Studies: CBC Lab Results  Component Value Date   WBC 5.9 05/14/2018   HGB 11.1 (L) 05/14/2018   HCT 36.4 05/14/2018   MCV 102.0 (H) 05/14/2018   PLT 217 05/14/2018    BMET    Component Value Date/Time   NA 140 05/14/2018 1037   K 4.2 05/14/2018 1037   CL 104 04/28/2018 1556   CO2 27 04/28/2018 1556   GLUCOSE 80 05/14/2018 1037   BUN 19 04/28/2018 1556   CREATININE 5.24 (H) 05/14/2018 1840   CALCIUM 8.2 (L) 04/28/2018 1556   GFRNONAA 7 (L) 05/14/2018 1840   GFRAA 8 (L) 05/14/2018 1840  COAG Lab Results  Component Value Date   INR 2.95 10/04/2017   INR 1.77 08/14/2017   INR 1.88 05/28/2017     Disposition:  Discharge to :Home Discharge Instructions    Call MD for:  redness, tenderness, or signs of infection (pain, swelling, bleeding, redness, odor or green/yellow discharge around incision site)   Complete by:  As directed    Call MD for:  severe or increased pain, loss or decreased feeling  in affected limb(s)   Complete by:  As directed    Call MD for:  temperature >100.5   Complete by:  As directed    Discharge instructions   Complete by:  As directed    [facility] RN for hydrogel wet to dry dressing changes.  2 cm x 3 cm x 2.5 cm deep.   Resume  previous diet   Complete by:  As directed      Allergies as of 05/15/2018      Reactions   Amoxicillin Rash   Has patient had a PCN reaction causing immediate rash, facial/tongue/throat swelling, SOB or lightheadedness with hypotension:YES Has patient had a PCN reaction causing severe rash involving mucus membranes or skin necrosis: Yes Has patient had a PCN reaction that required hospitalization No Has patient had a PCN reaction occurring within the last 10 years: Yes If all of the above answers are "NO", then may proceed with Cephalosporin use.   Sulfa Drugs Cross Reactors Hives, Itching   Percocet [oxycodone-acetaminophen] Itching, Rash   Did not happen last time she took it   Sulfa Antibiotics Hives      Medication List    TAKE these medications   ACCU-CHEK AVIVA PLUS test strip Generic drug:  glucose blood   acetaminophen 500 MG tablet Commonly known as:  TYLENOL Take 1,000 mg by mouth as needed for mild pain or headache.   albuterol 108 (90 Base) MCG/ACT inhaler Commonly known as:  PROVENTIL HFA;VENTOLIN HFA Inhale 2 puffs into the lungs every 6 (six) hours as needed for wheezing or shortness of breath.   alendronate 70 MG tablet Commonly known as:  FOSAMAX Take 70 mg by mouth once a week.   aspirin EC 81 MG tablet Take 81 mg by mouth daily.   AURYXIA 1 GM 210 MG(Fe) tablet Generic drug:  ferric citrate Take 210 mg by mouth 3 (three) times daily before meals.   budesonide-formoterol 80-4.5 MCG/ACT inhaler Commonly known as:  SYMBICORT Inhale 2 puffs into the lungs 2 (two) times daily.   cinacalcet 30 MG tablet Commonly known as:  SENSIPAR Take 1 tablet (30 mg total) by mouth every Monday, Wednesday, and Friday. What changed:    when to take this  additional instructions   citalopram 40 MG tablet Commonly known as:  CELEXA Take 40 mg by mouth daily.   colchicine 0.6 MG tablet Take 1 tablet (0.6 mg total) by mouth every 3 (three) days. Dose needs to be  decreased to twice a week in dialysis patient.   doxercalciferol 4 MCG/2ML injection Commonly known as:  HECTOROL Inject 0.5 mLs (1 mcg total) into the vein every Monday, Wednesday, and Friday with hemodialysis.   ezetimibe 10 MG tablet Commonly known as:  ZETIA Take 10 mg by mouth daily.   gabapentin 100 MG capsule Commonly known as:  NEURONTIN Take 1 capsule (100 mg total) by mouth at bedtime. What changed:  how much to take   HYDROcodone-acetaminophen 5-325 MG tablet Commonly known as:  NORCO Take 1 tablet  by mouth every 6 (six) hours as needed for moderate pain.   HYDROcodone-acetaminophen 5-325 MG tablet Commonly known as:  NORCO/VICODIN Take 1 tablet by mouth every 6 (six) hours as needed for moderate pain.   levothyroxine 100 MCG tablet Commonly known as:  SYNTHROID, LEVOTHROID Take 100 mcg by mouth daily.   LORazepam 0.5 MG tablet Commonly known as:  ATIVAN Take 0.5 mg by mouth at bedtime.   midodrine 10 MG tablet Commonly known as:  PROAMATINE Take 1 tablet (10 mg total) by mouth every Monday, Wednesday, and Friday with hemodialysis. Up to 6 doses on M,W,F AND TID TU AND THU What changed:    when to take this  additional instructions   multivitamin Tabs tablet Take 1 tablet by mouth daily.   nystatin cream Commonly known as:  MYCOSTATIN Apply 1 application topically 2 (two) times daily.   pantoprazole 40 MG tablet Commonly known as:  PROTONIX TAKE ONE TABLET BY MOUTH EVERY DAY 30-60MINUTES BEFORE FIRST MEAL OF THE DAY What changed:  See the new instructions.   ranitidine 150 MG tablet Commonly known as:  ZANTAC Take 150 mg by mouth 2 (two) times daily.   vancomycin  IVPB Inject into the vein 3 (three) times a week.   Vitamin D (Ergocalciferol) 1.25 MG (50000 UT) Caps capsule Commonly known as:  DRISDOL Take 50,000 Units by mouth every Monday.      Verbal and written Discharge instructions given to the patient. Wound care per Discharge  AVS Follow-up Information    Elam Dutch, MD In 2 weeks.   Specialties:  Vascular Surgery, Cardiology Why:  Office will call you to arrange your appt (sent) Contact information: [place] [address] [phone]           Signed: Roxy Horseman 05/16/2018, 9:28 AM

## 2018-05-20 LAB — AEROBIC/ANAEROBIC CULTURE W GRAM STAIN (SURGICAL/DEEP WOUND)

## 2018-05-20 LAB — AEROBIC/ANAEROBIC CULTURE (SURGICAL/DEEP WOUND): Culture: NO GROWTH

## 2018-05-22 ENCOUNTER — Encounter: Payer: Self-pay | Admitting: Adult Health

## 2018-05-22 ENCOUNTER — Ambulatory Visit (INDEPENDENT_AMBULATORY_CARE_PROVIDER_SITE_OTHER): Payer: Medicare Other | Admitting: Adult Health

## 2018-05-22 VITALS — BP 130/78 | HR 71 | Ht 62.0 in | Wt 192.4 lb

## 2018-05-22 DIAGNOSIS — I150 Renovascular hypertension: Secondary | ICD-10-CM | POA: Diagnosis not present

## 2018-05-22 DIAGNOSIS — I953 Hypotension of hemodialysis: Secondary | ICD-10-CM

## 2018-05-22 DIAGNOSIS — I480 Paroxysmal atrial fibrillation: Secondary | ICD-10-CM | POA: Diagnosis not present

## 2018-05-22 MED ORDER — MIDODRINE HCL 10 MG PO TABS: 10.0000 mg | ORAL_TABLET | ORAL | 3 refills | Status: DC

## 2018-05-22 NOTE — Patient Instructions (Signed)
Follow-Up: You will need a follow up appointment in [place].  Please call our office 2 months in (June 2020) advance to schedule this(AUGUST 2020)  appointment.  You may see Minus Breeding, MD or one of the following Advanced Practice Providers on your designated Care Team:   Rosaria Ferries, PA-C , Jory Sims, DNP, AACC   Medication Instructions:  NO CHANGES- Your physician recommends that you continue on your current medications as directed. Please refer to the Current Medication list given to you today. If you need a refill on your cardiac medications before your next appointment, please call your pharmacy. Labwork: When you have labs (blood work) and your tests are completely normal, you will receive your results ONLY by [place] (if you have MyChart) -OR- A paper copy in the mail.  At [facility], you and your health needs are our priority.  As part of our continuing mission to provide you with exceptional heart care, we have created designated Provider Care Teams.  These Care Teams include your primary Cardiologist (physician) and Advanced Practice Providers (APPs -  Physician Assistants and Nurse Practitioners) who all work together to provide you with the care you need, when you need it.  Thank you for choosing [facility]!!

## 2018-05-22 NOTE — Progress Notes (Signed)
Cardiology Office Note   Date:  05/22/2018   ID:  [name], DOB 1940/10/15, MRN [id number]  PCP:  Dione Housekeeper, MD  Cardiologist: [facility]  Chief Complaint  Patient presents with  . Atrial Fibrillation  . Hospitalization Follow-up     History of Present Illness: [name] is a 78 y.o. female who presents for post hospital follow up after admission for hematoma of the left arm after having brachiocephalic AV fistula placed in 2013 which was ligated in 2016 for subclavian steel symptoms She was also treated with antibiotics with Vancomycin.   She is seen by cardiology for management and assessment of atrial fibrillation. She has other history of ESRD on dialysis, HL, GERD, Hypothyroidism and diet controlled diabetes.   She was sent home with wound care to follow. She is recovering well and has no pain in the fistula site. She is medically compliant.   Past Medical History:  Diagnosis Date  . A-fib ([place])   . Acute respiratory failure ([place]) 05/2017  . Anemia   . Anxiety   . Arthritis   . Depression   . Diabetes mellitus    "diet controlled"  . Diabetes mellitus without complication ([place])   . ESRD (end stage renal disease) ([place])    dialysis MWF  . GERD (gastroesophageal reflux disease)   . Gout   . History of blood transfusion   . HLD (hyperlipidemia)   . HOH (hard of hearing)    left ear  . Hypertension    hypotensive -since starting dialysis  . Hypothyroidism   . PAF (paroxysmal atrial fibrillation) ([place])    a. Echo 11/16:  Mild LVH, EF 55-60%, normal wall motion, MAC, mild MR, severe LAE (49 ml/m2), mild RVE, normal RVSF, mild RAE, mild TR, PASP 24 mmHg;  CHADS2-VASc: 4 >> Coumadin followed by PCP  . Pneumonia   . Renal disorder   . Wears dentures   . Wears glasses     Past Surgical History:  Procedure Laterality Date  . AV FISTULA PLACEMENT  04/03/2012   Procedure: ARTERIOVENOUS (AV) FISTULA CREATION;  Surgeon: Elam Dutch, MD;  Location: [place];   Service: Vascular;  Laterality: Left;  creation left brachial cephalic fistula   . CARPAL TUNNEL RELEASE Left   . COLONOSCOPY W/ POLYPECTOMY    . DILATION AND CURETTAGE OF UTERUS    . EYE SURGERY Bilateral    bilateral cataract removal  . HEMATOMA EVACUATION Left 05/14/2018   Procedure: Incision and Drainage of Left Arm Hematoma;  Surgeon: Elam Dutch, MD;  Location: [facility] OR;  Service: Vascular;  Laterality: Left;  . Hemodialysis  catheter Right   . IR FLUORO GUIDE CV LINE RIGHT  05/26/2017  . IR REMOVAL TUN CV CATH W/O FL  05/24/2017  . IR US GUIDE VASC ACCESS RIGHT  05/26/2017  . JOINT REPLACEMENT Bilateral    bilateral knee  . JOINT REPLACEMENT Right    shoulder  . LIGATION OF ARTERIOVENOUS  FISTULA Left 02/04/2015   Procedure: LIGATION OF BRACHIOCEPHALIC ARTERIOVENOUS  FISTULA;  Surgeon: Conrad Webster, MD;  Location: [place];  Service: Vascular;  Laterality: Left;  Marland Kitchen MULTIPLE TOOTH EXTRACTIONS    . PORTACATH PLACEMENT    . REVERSE SHOULDER ARTHROPLASTY Left 01/22/2016   Procedure: LEFT REVERSE SHOULDER ARTHROPLASTY;  Surgeon: Netta Cedars, MD;  Location: [place];  Service: Orthopedics;  Laterality: Left;  . SPLIT NIGHT STUDY  07/26/2015  . STERIOD INJECTION Right 01/22/2016   Procedure: RIGHT RING  FINGER STEROID INJECTION;  Surgeon: Netta Cedars, MD;  Location: [place];  Service: Orthopedics;  Laterality: Right;  . TEE WITHOUT CARDIOVERSION N/A 05/26/2017   Procedure: TRANSESOPHAGEAL ECHOCARDIOGRAM (TEE);  Surgeon: Lelon Perla, MD;  Location: [place];  Service: Cardiovascular;  Laterality: N/A;  . THROMBECTOMY BRACHIAL ARTERY Left 02/06/2015   Procedure: EVACUATION OF LEFT ARM HEMATOMA;  Surgeon: Angelia Mould, MD;  Location: [facility];  Service: Vascular;  Laterality: Left;  . TOTAL KNEE ARTHROPLASTY     right knee  . TUBAL LIGATION       Current Outpatient Medications  Medication Sig Dispense Refill  . ACCU-CHEK AVIVA PLUS test strip     . acetaminophen (TYLENOL) 500  MG tablet Take 1,000 mg by mouth as needed for mild pain or headache.    . albuterol (PROVENTIL HFA;VENTOLIN HFA) 108 (90 Base) MCG/ACT inhaler Inhale 2 puffs into the lungs every 6 (six) hours as needed for wheezing or shortness of breath.    Marland Kitchen alendronate (FOSAMAX) 70 MG tablet Take 70 mg by mouth once a week.     Marland Kitchen aspirin EC 81 MG tablet Take 81 mg by mouth daily.    . budesonide-formoterol (SYMBICORT) 80-4.5 MCG/ACT inhaler Inhale 2 puffs into the lungs 2 (two) times daily. 1 Inhaler 12  . cinacalcet (SENSIPAR) 30 MG tablet Take 1 tablet (30 mg total) by mouth every Monday, Wednesday, and Friday. (Patient taking differently: Take 30 mg by mouth See admin instructions. Mondays and fridays) 60 tablet 0  . citalopram (CELEXA) 40 MG tablet Take 40 mg by mouth daily.     . colchicine 0.6 MG tablet Take 1 tablet (0.6 mg total) by mouth every 3 (three) days. Dose needs to be decreased to twice a week in dialysis patient.    Marland Kitchen doxercalciferol (HECTOROL) 4 MCG/2ML injection Inject 0.5 mLs (1 mcg total) into the vein every Monday, Wednesday, and Friday with hemodialysis. 2 mL 0  . ezetimibe (ZETIA) 10 MG tablet Take 10 mg by mouth daily.     . Ferric Citrate (AURYXIA) 1 GM 210 MG(Fe) TABS Take 210 mg by mouth 3 (three) times daily before meals.     . gabapentin (NEURONTIN) 100 MG capsule Take 1 capsule (100 mg total) by mouth at bedtime. (Patient taking differently: Take 200 mg by mouth at bedtime. ) 30 capsule 0  . HYDROcodone-acetaminophen (NORCO) 5-325 MG tablet Take 1 tablet by mouth every 6 (six) hours as needed for moderate pain. 30 tablet 0  . HYDROcodone-acetaminophen (NORCO/VICODIN) 5-325 MG tablet Take 1 tablet by mouth every 6 (six) hours as needed for moderate pain. 14 tablet 0  . levothyroxine (SYNTHROID, LEVOTHROID) 100 MCG tablet Take 100 mcg by mouth daily.      Marland Kitchen LORazepam (ATIVAN) 0.5 MG tablet Take 0.5 mg by mouth at bedtime.     . midodrine (PROAMATINE) 10 MG tablet Take 1 tablet (10  mg total) by mouth See admin instructions. Take 1 tablet by mouth 3 times a day; expected on dialysis days Monday, Wednesday and Friday every 4 hours 180 tablet 3  . multivitamin (RENA-VIT) TABS tablet Take 1 tablet by mouth daily.    Marland Kitchen nystatin cream (MYCOSTATIN) Apply 1 application topically 2 (two) times daily.     . pantoprazole (PROTONIX) 40 MG tablet TAKE ONE TABLET BY MOUTH EVERY DAY 30-60MINUTES BEFORE FIRST MEAL OF THE DAY (Patient taking differently: Take 40 mg by mouth daily. ) 30 tablet 2  . ranitidine (ZANTAC) 150 MG  tablet Take 150 mg by mouth 2 (two) times daily.    . vancomycin IVPB Inject into the vein 3 (three) times a week.    . Vitamin D, Ergocalciferol, (DRISDOL) 50000 UNITS CAPS Take 50,000 Units by mouth every Monday.      No current facility-administered medications for this visit.     Allergies:   Amoxicillin; Sulfa drugs cross reactors; Percocet [oxycodone-acetaminophen]; and Sulfa antibiotics    Social History:  The patient  reports that she quit smoking about 20 years ago. Her smoking use included cigarettes. She has a 0.50 pack-year smoking history. She has never used smokeless tobacco. She reports that she does not drink alcohol or use drugs.   Family History:  The patient's family history includes Cancer in her brother and sister; Diabetes in her father and sister; Heart disease in her father; Hyperlipidemia in her daughter; Hypertension in her daughter and son; Lung cancer in her sister.    ROS: All other systems are reviewed and negative. Unless otherwise mentioned in H&P    PHYSICAL EXAM: VS:  BP 130/78   Pulse 71   Ht 5' 2"  (1.575 m)   Wt 192 lb 6.4 oz (87.3 kg)   SpO2 98% Comment: 3L  BMI 35.19 kg/m  , BMI Body mass index is 35.19 kg/m. GEN: Well nourished, well developed, in no acute distress HEENT: normal Neck: no JVD, carotid bruits, or masses Cardiac: RRR; no murmurs, rubs, or gallops,no edema  Respiratory:  Clear to auscultation bilaterally,  normal work of breathing, on O2 via [place].  GI: soft, nontender, nondistended, + BS MS: no deformity or atrophy. Dressing to left brachial.  Skin: warm and dry, no rash Neuro:  Strength and sensation are intact Psych: euthymic mood, full affect   EKG:  Not completed today.   Recent Labs: 05/28/2017: Magnesium 2.0 08/14/2017: ALT 28 04/28/2018: BUN 19 05/14/2018: Creatinine, Ser 5.24; Hemoglobin 11.1; Platelets 217; Potassium 4.2; Sodium 140    Lipid Panel    Component Value Date/Time   CHOL 107 05/25/2017 0421   TRIG 88 05/25/2017 0421   HDL 32 (L) 05/25/2017 0421   CHOLHDL 3.3 05/25/2017 0421   VLDL 18 05/25/2017 0421   LDLCALC 57 05/25/2017 0421      Wt Readings from Last 3 Encounters:  05/22/18 192 lb 6.4 oz (87.3 kg)  05/14/18 193 lb 5.5 oz (87.7 kg)  05/10/18 192 lb (87.1 kg)      Other studies Reviewed: Echocardiogram 2/29/2019  Left ventricle: The cavity size was normal. There was moderate concentric hypertrophy. Systolic function was normal. The estimated ejection fraction was in the range of 60% to 65%. Wall motion was normal; there were no regional wall motion abnormalities. - Aortic valve: Transvalvular velocity was within the normal range. There was no stenosis. There was no regurgitation. - Mitral valve: Transvalvular velocity was within the normal range. There was no evidence for stenosis. There was no regurgitation. - Left atrium: The atrium was severely dilated. - Right ventricle: The cavity size was normal. Wall thickness was normal. Systolic function was normal. - Atrial septum: No defect or patent foramen ovale was identified. - Tricuspid valve: There was mild regurgitation. - Pulmonary arteries: Systolic pressure was within the normal range. PA peak pressure: 26 mm Hg (S).  ASSESSMENT AND PLAN:  1. PAF: She is in normal rhythm by auscultation. She currently not on anticoagulation.   2. Orthostatic Hypotension: She is not having  episodes since we adjusted dosing times on Midodrine around  dialysis.   3. Hypertension Currently well controlled. No changes in her regimen.  4. Left arm fistula hematoma: Healing well, followed by wound care and by [facility]. Continues on antibiotics.   Current medicines are reviewed at length with the patient today.    Labs/ tests ordered today include: None   Phill Myron. West Pugh, ANP, AACC   05/22/2018 12:43 PM    [facility] [place] [address] Office [phone] Fax [phone]

## 2018-05-30 ENCOUNTER — Encounter: Payer: Self-pay | Admitting: Nephrology

## 2018-05-31 ENCOUNTER — Other Ambulatory Visit: Payer: Self-pay

## 2018-05-31 ENCOUNTER — Encounter: Payer: Self-pay | Admitting: Vascular Surgery

## 2018-05-31 ENCOUNTER — Ambulatory Visit (INDEPENDENT_AMBULATORY_CARE_PROVIDER_SITE_OTHER): Payer: Medicare Other | Admitting: Vascular Surgery

## 2018-05-31 VITALS — BP 151/89 | HR 72 | Temp 97.2°F | Resp 18 | Ht 62.0 in | Wt 192.0 lb

## 2018-05-31 DIAGNOSIS — S40022D Contusion of left upper arm, subsequent encounter: Secondary | ICD-10-CM

## 2018-05-31 NOTE — Progress Notes (Signed)
Patient is a 78 year old female who returns for follow-up today.  She recently had I&D of her left upper extremity which revealed some muscle necrosis but no obvious association with her AV fistula.  She reports her arm is much improved.  She has no incisional drainage.  She denies any fever or chills.  Physical exam:  Vitals:   05/31/18 0901  BP: (!) 151/89  Pulse: 72  Resp: 18  Temp: (!) 97.2 F (36.2 C)  TempSrc: Oral  SpO2: 96%  Weight: 192 lb (87.1 kg)  Height: 5\' 2"  (1.575 m)    Left upper extremity healing left antecubital incision some slight separation of the skin edge less than a millimeter depth no erythema no fluctuance no drainage  Assessment: Healing left upper extremity after evacuation of hematoma no obvious infection at this point.  Patient clinically feels much better.  Plan: The patient will follow-up with [place] on an as-needed basis.  Ruta Hinds, MD [facility] Office: [phone] Pager: [phone]

## 2018-06-05 ENCOUNTER — Encounter: Payer: Self-pay | Admitting: Gastroenterology

## 2018-08-19 ENCOUNTER — Other Ambulatory Visit: Payer: Self-pay

## 2018-08-19 ENCOUNTER — Inpatient Hospital Stay (HOSPITAL_COMMUNITY)
Admission: EM | Admit: 2018-08-19 | Discharge: 2018-08-21 | DRG: 377 | Disposition: A | Discharge status: Home Health | Payer: Medicare Other | Attending: Internal Medicine | Admitting: Internal Medicine

## 2018-08-19 ENCOUNTER — Encounter (HOSPITAL_COMMUNITY): Payer: Self-pay | Admitting: Emergency Medicine

## 2018-08-19 DIAGNOSIS — E119 Type 2 diabetes mellitus without complications: Secondary | ICD-10-CM

## 2018-08-19 DIAGNOSIS — F32A Depression, unspecified: Secondary | ICD-10-CM | POA: Diagnosis present

## 2018-08-19 DIAGNOSIS — Z87891 Personal history of nicotine dependence: Secondary | ICD-10-CM

## 2018-08-19 DIAGNOSIS — D62 Acute posthemorrhagic anemia: Secondary | ICD-10-CM | POA: Diagnosis present

## 2018-08-19 DIAGNOSIS — Z96612 Presence of left artificial shoulder joint: Secondary | ICD-10-CM | POA: Diagnosis present

## 2018-08-19 DIAGNOSIS — K5731 Diverticulosis of large intestine without perforation or abscess with bleeding: Secondary | ICD-10-CM | POA: Diagnosis not present

## 2018-08-19 DIAGNOSIS — Z885 Allergy status to narcotic agent status: Secondary | ICD-10-CM

## 2018-08-19 DIAGNOSIS — D649 Anemia, unspecified: Secondary | ICD-10-CM | POA: Diagnosis present

## 2018-08-19 DIAGNOSIS — Z8249 Family history of ischemic heart disease and other diseases of the circulatory system: Secondary | ICD-10-CM

## 2018-08-19 DIAGNOSIS — Z79890 Hormone replacement therapy (postmenopausal): Secondary | ICD-10-CM

## 2018-08-19 DIAGNOSIS — E7800 Pure hypercholesterolemia, unspecified: Secondary | ICD-10-CM | POA: Diagnosis present

## 2018-08-19 DIAGNOSIS — F419 Anxiety disorder, unspecified: Secondary | ICD-10-CM | POA: Diagnosis present

## 2018-08-19 DIAGNOSIS — Z7983 Long term (current) use of bisphosphonates: Secondary | ICD-10-CM

## 2018-08-19 DIAGNOSIS — K921 Melena: Secondary | ICD-10-CM | POA: Diagnosis not present

## 2018-08-19 DIAGNOSIS — D125 Benign neoplasm of sigmoid colon: Secondary | ICD-10-CM | POA: Diagnosis present

## 2018-08-19 DIAGNOSIS — E785 Hyperlipidemia, unspecified: Secondary | ICD-10-CM | POA: Diagnosis present

## 2018-08-19 DIAGNOSIS — M1990 Unspecified osteoarthritis, unspecified site: Secondary | ICD-10-CM | POA: Diagnosis present

## 2018-08-19 DIAGNOSIS — I480 Paroxysmal atrial fibrillation: Secondary | ICD-10-CM | POA: Diagnosis present

## 2018-08-19 DIAGNOSIS — K259 Gastric ulcer, unspecified as acute or chronic, without hemorrhage or perforation: Secondary | ICD-10-CM | POA: Diagnosis present

## 2018-08-19 DIAGNOSIS — E039 Hypothyroidism, unspecified: Secondary | ICD-10-CM | POA: Diagnosis present

## 2018-08-19 DIAGNOSIS — H9190 Unspecified hearing loss, unspecified ear: Secondary | ICD-10-CM | POA: Diagnosis present

## 2018-08-19 DIAGNOSIS — Z7982 Long term (current) use of aspirin: Secondary | ICD-10-CM

## 2018-08-19 DIAGNOSIS — Z882 Allergy status to sulfonamides status: Secondary | ICD-10-CM

## 2018-08-19 DIAGNOSIS — Z823 Family history of stroke: Secondary | ICD-10-CM

## 2018-08-19 DIAGNOSIS — Z881 Allergy status to other antibiotic agents status: Secondary | ICD-10-CM

## 2018-08-19 DIAGNOSIS — K210 Gastro-esophageal reflux disease with esophagitis: Secondary | ICD-10-CM | POA: Diagnosis present

## 2018-08-19 DIAGNOSIS — E1122 Type 2 diabetes mellitus with diabetic chronic kidney disease: Secondary | ICD-10-CM | POA: Diagnosis present

## 2018-08-19 DIAGNOSIS — Z96651 Presence of right artificial knee joint: Secondary | ICD-10-CM | POA: Diagnosis present

## 2018-08-19 DIAGNOSIS — Z833 Family history of diabetes mellitus: Secondary | ICD-10-CM

## 2018-08-19 DIAGNOSIS — I120 Hypertensive chronic kidney disease with stage 5 chronic kidney disease or end stage renal disease: Secondary | ICD-10-CM | POA: Diagnosis present

## 2018-08-19 DIAGNOSIS — Z992 Dependence on renal dialysis: Secondary | ICD-10-CM

## 2018-08-19 DIAGNOSIS — M109 Gout, unspecified: Secondary | ICD-10-CM | POA: Diagnosis present

## 2018-08-19 DIAGNOSIS — Z7951 Long term (current) use of inhaled steroids: Secondary | ICD-10-CM

## 2018-08-19 DIAGNOSIS — K449 Diaphragmatic hernia without obstruction or gangrene: Secondary | ICD-10-CM | POA: Diagnosis present

## 2018-08-19 DIAGNOSIS — I959 Hypotension, unspecified: Secondary | ICD-10-CM | POA: Diagnosis not present

## 2018-08-19 DIAGNOSIS — Z96611 Presence of right artificial shoulder joint: Secondary | ICD-10-CM | POA: Diagnosis present

## 2018-08-19 DIAGNOSIS — Z8261 Family history of arthritis: Secondary | ICD-10-CM

## 2018-08-19 DIAGNOSIS — D123 Benign neoplasm of transverse colon: Secondary | ICD-10-CM | POA: Diagnosis present

## 2018-08-19 DIAGNOSIS — Z66 Do not resuscitate: Secondary | ICD-10-CM | POA: Diagnosis present

## 2018-08-19 DIAGNOSIS — Z6829 Body mass index (BMI) 29.0-29.9, adult: Secondary | ICD-10-CM

## 2018-08-19 DIAGNOSIS — F329 Major depressive disorder, single episode, unspecified: Secondary | ICD-10-CM | POA: Diagnosis present

## 2018-08-19 DIAGNOSIS — D631 Anemia in chronic kidney disease: Secondary | ICD-10-CM | POA: Diagnosis present

## 2018-08-19 DIAGNOSIS — K621 Rectal polyp: Secondary | ICD-10-CM | POA: Diagnosis present

## 2018-08-19 DIAGNOSIS — Z20828 Contact with and (suspected) exposure to other viral communicable diseases: Secondary | ICD-10-CM | POA: Diagnosis present

## 2018-08-19 DIAGNOSIS — Z801 Family history of malignant neoplasm of trachea, bronchus and lung: Secondary | ICD-10-CM

## 2018-08-19 DIAGNOSIS — J449 Chronic obstructive pulmonary disease, unspecified: Secondary | ICD-10-CM | POA: Diagnosis present

## 2018-08-19 DIAGNOSIS — N186 End stage renal disease: Secondary | ICD-10-CM

## 2018-08-19 DIAGNOSIS — K319 Disease of stomach and duodenum, unspecified: Secondary | ICD-10-CM | POA: Diagnosis present

## 2018-08-19 DIAGNOSIS — Z96653 Presence of artificial knee joint, bilateral: Secondary | ICD-10-CM | POA: Diagnosis present

## 2018-08-19 DIAGNOSIS — K922 Gastrointestinal hemorrhage, unspecified: Secondary | ICD-10-CM | POA: Diagnosis not present

## 2018-08-19 DIAGNOSIS — D124 Benign neoplasm of descending colon: Secondary | ICD-10-CM | POA: Diagnosis present

## 2018-08-19 HISTORY — DX: Chronic obstructive pulmonary disease, unspecified: J44.9

## 2018-08-19 LAB — COMPREHENSIVE METABOLIC PANEL
ALT: 19 U/L (ref 0–44)
AST: 25 U/L (ref 15–41)
Albumin: 3.6 g/dL (ref 3.5–5.0)
Alkaline Phosphatase: 112 U/L (ref 38–126)
Anion gap: 13 (ref 5–15)
BUN: 37 mg/dL — ABNORMAL HIGH (ref 8–23)
CO2: 23 mmol/L (ref 22–32)
Calcium: 8 mg/dL — ABNORMAL LOW (ref 8.9–10.3)
Chloride: 102 mmol/L (ref 98–111)
Creatinine, Ser: 7.71 mg/dL — ABNORMAL HIGH (ref 0.44–1.00)
GFR calc Af Amer: 5 mL/min — ABNORMAL LOW (ref >60)
GFR calc non Af Amer: 5 mL/min — ABNORMAL LOW (ref >60)
Glucose, Bld: 102 mg/dL — ABNORMAL HIGH (ref 70–99)
Potassium: 4.2 mmol/L (ref 3.5–5.1)
Sodium: 138 mmol/L (ref 135–145)
Total Bilirubin: 0.6 mg/dL (ref 0.3–1.2)
Total Protein: 6.1 g/dL — ABNORMAL LOW (ref 6.5–8.1)

## 2018-08-19 LAB — CBC WITH DIFFERENTIAL/PLATELET
Abs Immature Granulocytes: 0.02 10*3/uL (ref 0.00–0.07)
Basophils Absolute: 0.1 10*3/uL (ref 0.0–0.1)
Basophils Relative: 1 %
Eosinophils Absolute: 0.4 10*3/uL (ref 0.0–0.5)
Eosinophils Relative: 5 %
HCT: 28.6 % — ABNORMAL LOW (ref 36.0–46.0)
Hemoglobin: 8.9 g/dL — ABNORMAL LOW (ref 12.0–15.0)
Immature Granulocytes: 0 %
Lymphocytes Relative: 18 %
Lymphs Abs: 1.3 10*3/uL (ref 0.7–4.0)
MCH: 30.4 pg (ref 26.0–34.0)
MCHC: 31.1 g/dL (ref 30.0–36.0)
MCV: 97.6 fL (ref 80.0–100.0)
Monocytes Absolute: 0.7 10*3/uL (ref 0.1–1.0)
Monocytes Relative: 9 %
Neutro Abs: 4.7 10*3/uL (ref 1.7–7.7)
Neutrophils Relative %: 67 %
Platelets: 214 10*3/uL (ref 150–400)
RBC: 2.93 MIL/uL — ABNORMAL LOW (ref 3.87–5.11)
RDW: 15 % (ref 11.5–15.5)
WBC: 7.1 10*3/uL (ref 4.0–10.5)
nRBC: 0 % (ref 0.0–0.2)

## 2018-08-19 LAB — POC OCCULT BLOOD, ED: Fecal Occult Bld: POSITIVE — AB

## 2018-08-19 MED ORDER — PANTOPRAZOLE SODIUM 40 MG IV SOLR
40.0000 mg | Freq: Two times a day (BID) | INTRAVENOUS | Status: DC
  Administered 2018-08-20: 01:00:00 40 mg via INTRAVENOUS
  Filled 2018-08-19: qty 40

## 2018-08-19 MED ORDER — EZETIMIBE 10 MG PO TABS
10.0000 mg | ORAL_TABLET | Freq: Every day | ORAL | Status: DC
  Administered 2018-08-20 – 2018-08-21 (×2): 10 mg via ORAL
  Filled 2018-08-19 (×2): qty 1

## 2018-08-19 MED ORDER — RENA-VITE PO TABS
1.0000 | ORAL_TABLET | Freq: Every day | ORAL | Status: DC
  Administered 2018-08-20: 1 via ORAL
  Filled 2018-08-19: qty 1

## 2018-08-19 MED ORDER — GABAPENTIN 100 MG PO CAPS
200.0000 mg | ORAL_CAPSULE | Freq: Every day | ORAL | Status: DC
  Administered 2018-08-20 (×2): 200 mg via ORAL
  Filled 2018-08-19 (×2): qty 2

## 2018-08-19 MED ORDER — ONDANSETRON HCL 4 MG PO TABS: 4.0000 mg | ORAL_TABLET | Freq: Four times a day (QID) | ORAL | Status: DC | PRN

## 2018-08-19 MED ORDER — LEVOTHYROXINE SODIUM 100 MCG PO TABS
100.0000 ug | ORAL_TABLET | Freq: Every day | ORAL | Status: DC
  Administered 2018-08-20 – 2018-08-21 (×2): 100 ug via ORAL
  Filled 2018-08-19 (×2): qty 1

## 2018-08-19 MED ORDER — CITALOPRAM HYDROBROMIDE 40 MG PO TABS: 40.0000 mg | ORAL_TABLET | Freq: Every day | ORAL | Status: DC

## 2018-08-19 MED ORDER — MOMETASONE FURO-FORMOTEROL FUM 100-5 MCG/ACT IN AERO
2.0000 | INHALATION_SPRAY | Freq: Two times a day (BID) | RESPIRATORY_TRACT | Status: DC
  Administered 2018-08-20 – 2018-08-21 (×3): 2 via RESPIRATORY_TRACT
  Filled 2018-08-19: qty 8.8

## 2018-08-19 MED ORDER — ALBUTEROL SULFATE HFA 108 (90 BASE) MCG/ACT IN AERS: 2.0000 | INHALATION_SPRAY | Freq: Four times a day (QID) | RESPIRATORY_TRACT | Status: DC | PRN

## 2018-08-19 MED ORDER — ONDANSETRON HCL 4 MG/2ML IJ SOLN: 4.0000 mg | Freq: Four times a day (QID) | INTRAMUSCULAR | Status: DC | PRN

## 2018-08-19 MED ORDER — ACETAMINOPHEN 650 MG RE SUPP: 650.0000 mg | Freq: Four times a day (QID) | RECTAL | Status: DC | PRN

## 2018-08-19 MED ORDER — HYDROCODONE-ACETAMINOPHEN 5-325 MG PO TABS: 1.0000 | ORAL_TABLET | Freq: Four times a day (QID) | ORAL | Status: DC | PRN

## 2018-08-19 MED ORDER — ACETAMINOPHEN 325 MG PO TABS: 650.0000 mg | ORAL_TABLET | Freq: Four times a day (QID) | ORAL | Status: DC | PRN

## 2018-08-19 MED ORDER — MIDODRINE HCL 5 MG PO TABS
10.0000 mg | ORAL_TABLET | ORAL | Status: DC
  Administered 2018-08-20: 10 mg via ORAL
  Filled 2018-08-19: qty 2

## 2018-08-19 NOTE — ED Provider Notes (Signed)
[facility] EMERGENCY DEPARTMENT Provider Note   CSN: [id number] Arrival date & time: 08/19/18  2020    History   Chief Complaint No chief complaint on file.   HPI [name] is a 78 y.o. female with history of ESRD on dialysis Monday Wednesday Friday, A. fib, diabetes mellitus, GERD, HLD, hypertension presents for evaluation of acute onset, progressively worsening bright red blood per rectum since yesterday.  She reports that she noticed bright red blood in the commode with bowel movements and had 3-4 bowel movements yesterday which were in general normal for her.  Also noted blood when wiping.  Patient also notes generalized fatigue and weakness.  Denies shortness of breath, fevers, abdominal pain, nausea, or vomiting.  She has been compliant with dialysis.  She takes baby aspirin but no other anticoagulants.  Has not tried anything for her symptoms.  No aggravating or alleviating factors noted.  Spoke with patient's daughter-in-law Baldo Ash on the phone with the patient's permission who states that she had a sizable amount of blood in the commode with bowel movements and is concerned she may need a blood transfusion as she has had them in the past.  Has had colonoscopies with [facility] in the past.  She is on 3 L supplemental oxygen at all times at home.     The history is provided by the patient.    Past Medical History:  Diagnosis Date  . A-fib ([place])   . Acute respiratory failure ([place]) 05/2017  . Anemia   . Anxiety   . Arthritis   . Depression   . Diabetes mellitus    "diet controlled"  . Diabetes mellitus without complication ([employer])   . ESRD (end stage renal disease) ([place])    dialysis MWF  . GERD (gastroesophageal reflux disease)   . Gout   . History of blood transfusion   . HLD (hyperlipidemia)   . HOH (hard of hearing)    left ear  . Hypertension    hypotensive -since starting dialysis  . Hypothyroidism   . PAF (paroxysmal atrial fibrillation)  ([place])    a. Echo 11/16:  Mild LVH, EF 55-60%, normal wall motion, MAC, mild MR, severe LAE (49 ml/m2), mild RVE, normal RVSF, mild RAE, mild TR, PASP 24 mmHg;  CHADS2-VASc: 4 >> Coumadin followed by PCP  . Pneumonia   . Renal disorder   . Wears dentures   . Wears glasses     Patient Active Problem List   Diagnosis Date Noted  . Acute GI bleeding 08/19/2018  . Depression 08/19/2018  . Cellulitis of right arm 05/14/2018  . Disorder of tendon of posterior tibial muscle 04/11/2018  . Pain of joint of left ankle and foot 02/27/2018  . Pain in joint of right ankle 02/27/2018  . Pernicious anemia 12/12/2017  . Carpal tunnel syndrome of left wrist 09/26/2017  . Arterial hypotension 08/14/2017  . Leukocytosis 06/05/2017  . MRSA (methicillin resistant staph aureus) culture positive 06/01/2017  . Community acquired pneumonia of right lung   . Morbid obesity due to excess calories ([place]) 05/03/2016  . Acute respiratory failure with hypoxia ([place]) 04/14/2016  . Fever 04/12/2016  . Sepsis ([place]) 04/12/2016  . Diabetes mellitus with complication ([place])   . Gastroesophageal reflux disease without esophagitis   . Dyslipidemia 02/09/2016  . 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 Cough variant asthma vs UACS  09/01/2015  . Acute bronchitis 06/16/2015  . Anemia due to vitamin B12 deficiency 04/21/2015  .  Anxiety, generalized 04/21/2015  . Atrial fibrillation ([place]) 02/16/2015  . Chronic anticoagulation 02/16/2015  . Anemia   . PAF (paroxysmal atrial fibrillation) ([place]) 02/04/2015  . Diabetes mellitus type 2, diet-controlled ([place])   . Hypothyroidism   . High cholesterol   . Steal syndrome dialysis vascular access ([place]) 12/03/2014  . Physical deconditioning 08/04/2013  . Hyperkalemia 07/26/2013  . Chronic kidney disease due to type 2 diabetes mellitus ([place]) 07/26/2013  . Hematoma and contusion 07/18/2013  . ESRD (end stage renal disease) on dialysis ([place]) 03/15/2012    Past Surgical  History:  Procedure Laterality Date  . AV FISTULA PLACEMENT  04/03/2012   Procedure: ARTERIOVENOUS (AV) FISTULA CREATION;  Surgeon: Elam Dutch, MD;  Location: [facility];  Service: Vascular;  Laterality: Left;  creation left brachial cephalic fistula   . CARPAL TUNNEL RELEASE Left   . COLONOSCOPY W/ POLYPECTOMY    . DILATION AND CURETTAGE OF UTERUS    . EYE SURGERY Bilateral    bilateral cataract removal  . HEMATOMA EVACUATION Left 05/14/2018   Procedure: Incision and Drainage of Left Arm Hematoma;  Surgeon: Elam Dutch, MD;  Location[facility];  Service: Vascular;  Laterality: Left;  . Hemodialysis  catheter Right   . IR FLUORO GUIDE CV LINE RIGHT  05/26/2017  . IR REMOVAL TUN CV CATH W/O FL  05/24/2017  . IR US GUIDE VASC ACCESS RIGHT  05/26/2017  . JOINT REPLACEMENT Bilateral    bilateral knee  . JOINT REPLACEMENT Right    shoulder  . LIGATION OF ARTERIOVENOUS  FISTULA Left 02/04/2015   Procedure: LIGATION OF BRACHIOCEPHALIC ARTERIOVENOUS  FISTULA;  Surgeon: Conrad Dane, MD;  Location: [facility];  Service: Vascular;  Laterality: Left;  Marland Kitchen MULTIPLE TOOTH EXTRACTIONS    . PORTACATH PLACEMENT    . REVERSE SHOULDER ARTHROPLASTY Left 01/22/2016   Procedure: LEFT REVERSE SHOULDER ARTHROPLASTY;  Surgeon: Netta Cedars, MD;  Location: [facility];  Service: Orthopedics;  Laterality: Left;  . SPLIT NIGHT STUDY  07/26/2015  . STERIOD INJECTION Right 01/22/2016   Procedure: RIGHT RING FINGER STEROID INJECTION;  Surgeon: Netta Cedars, MD;  Location: [facility];  Service: Orthopedics;  Laterality: Right;  . TEE WITHOUT CARDIOVERSION N/A 05/26/2017   Procedure: TRANSESOPHAGEAL ECHOCARDIOGRAM (TEE);  Surgeon: Lelon Perla, MD;  Location: [facility];  Service: Cardiovascular;  Laterality: N/A;  . THROMBECTOMY BRACHIAL ARTERY Left 02/06/2015   Procedure: EVACUATION OF LEFT ARM HEMATOMA;  Surgeon: Angelia Mould, MD;  Location: [facility];  Service: Vascular;  Laterality: Left;  . TOTAL KNEE ARTHROPLASTY      right knee  . TUBAL LIGATION       OB History    Gravida  0   Para  0   Term  0   Preterm  0   AB  0   Living        SAB  0   TAB  0   Ectopic  0   Multiple      Live Births               Home Medications    Prior to Admission medications   Medication Sig Start Date End Date Taking? Authorizing Provider  ACCU-CHEK AVIVA PLUS test strip  11/11/14   [provider]  acetaminophen (TYLENOL) 500 MG tablet Take 1,000 mg by mouth as needed for mild pain or headache.    [provider]  albuterol (PROVENTIL HFA;VENTOLIN HFA) 108 (90 Base) MCG/ACT inhaler Inhale  2 puffs into the lungs every 6 (six) hours as needed for wheezing or shortness of breath.    [provider]  alendronate (FOSAMAX) 70 MG tablet Take 70 mg by mouth once a week.  08/07/17   [provider]  aspirin EC 81 MG tablet Take 81 mg by mouth daily.    [provider]  budesonide-formoterol (SYMBICORT) 80-4.5 MCG/ACT inhaler Inhale 2 puffs into the lungs 2 (two) times daily. 01/09/17   Tanda Rockers, MD  cinacalcet (SENSIPAR) 30 MG tablet Take 1 tablet (30 mg total) by mouth every Monday, Wednesday, and Friday. Patient taking differently: Take 30 mg by mouth See admin instructions. Mondays and fridays 05/28/17   Allie Bossier, MD  citalopram (CELEXA) 40 MG tablet Take 40 mg by mouth daily.  05/29/17   [provider]  colchicine 0.6 MG tablet Take 1 tablet (0.6 mg total) by mouth every 3 (three) days. Dose needs to be decreased to twice a week in dialysis patient. 04/14/16   Debbe Odea, MD  doxercalciferol (HECTOROL) 4 MCG/2ML injection Inject 0.5 mLs (1 mcg total) into the vein every Monday, Wednesday, and Friday with hemodialysis. 05/29/17   Allie Bossier, MD  ezetimibe (ZETIA) 10 MG tablet Take 10 mg by mouth daily.     [provider]  Ferric Citrate (AURYXIA) 1 GM 210 MG(Fe) TABS Take 210 mg by mouth 3 (three) times daily before meals.      [provider]  gabapentin (NEURONTIN) 100 MG capsule Take 1 capsule (100 mg total) by mouth at bedtime. Patient taking differently: Take 200 mg by mouth at bedtime.  05/28/17   Allie Bossier, MD  HYDROcodone-acetaminophen (NORCO) 5-325 MG tablet Take 1 tablet by mouth every 6 (six) hours as needed for moderate pain. 01/22/16   Netta Cedars, MD  HYDROcodone-acetaminophen (NORCO/VICODIN) 5-325 MG tablet Take 1 tablet by mouth every 6 (six) hours as needed for moderate pain. 04/28/18   Fredia Sorrow, MD  levothyroxine (SYNTHROID, LEVOTHROID) 100 MCG tablet Take 100 mcg by mouth daily.      [provider]  LORazepam (ATIVAN) 0.5 MG tablet Take 0.5 mg by mouth at bedtime.  09/01/16   [provider]  midodrine (PROAMATINE) 10 MG tablet Take 1 tablet (10 mg total) by mouth See admin instructions. Take 1 tablet by mouth 3 times a day; expected on dialysis days Monday, Wednesday and Friday every 4 hours 05/22/18   Lendon Colonel, NP  multivitamin (RENA-VIT) TABS tablet Take 1 tablet by mouth daily.    [provider]  nystatin cream (MYCOSTATIN) Apply 1 application topically 2 (two) times daily.  03/16/18   [provider]  pantoprazole (PROTONIX) 40 MG tablet TAKE ONE TABLET BY MOUTH EVERY DAY 30-60MINUTES BEFORE FIRST MEAL OF THE DAY Patient taking differently: Take 40 mg by mouth daily.  08/09/17   Tanda Rockers, MD  ranitidine (ZANTAC) 150 MG tablet Take 150 mg by mouth 2 (two) times daily. 09/18/17   [provider]  vancomycin IVPB Inject into the vein 3 (three) times a week.    [provider]  Vitamin D, Ergocalciferol, (DRISDOL) 50000 UNITS CAPS Take 50,000 Units by mouth every Monday.     [provider]    Family History Family History  Problem Relation Age of Onset  . Diabetes Father        before age 33  . Heart disease Father   . Diabetes Sister   .  Cancer Brother   . Hyperlipidemia Daughter   .  Hypertension Daughter   . Hypertension Son   . Lung cancer Sister   . Cancer Sister     Social History Social History   Tobacco Use  . Smoking status: Former Smoker    Packs/day: 0.25    Years: 2.00    Pack years: 0.50    Types: Cigarettes    Last attempt to quit: 04/04/1998    Years since quitting: 20.3  . Smokeless tobacco: Never Used  Substance Use Topics  . Alcohol use: No    Alcohol/week: 0.0 standard drinks  . Drug use: No     Allergies   Amoxicillin; Sulfa drugs cross reactors; Percocet [oxycodone-acetaminophen]; and Sulfa antibiotics   Review of Systems Review of Systems  Constitutional: Positive for fatigue. Negative for chills and fever.  Respiratory: Negative for shortness of breath.   Cardiovascular: Negative for chest pain.  Gastrointestinal: Positive for blood in stool. Negative for abdominal pain, diarrhea, nausea and vomiting.  Neurological: Positive for weakness (generalized).  All other systems reviewed and are negative.    Physical Exam Updated Vital Signs BP 114/82   Pulse 68   Temp 98.7 F (37.1 C) (Oral)   Resp 14   SpO2 100%   Physical Exam Vitals signs and nursing note reviewed.  Constitutional:      General: She is not in acute distress.    Appearance: She is well-developed.  HENT:     Head: Normocephalic and atraumatic.  Eyes:     General:        Right eye: No discharge.        Left eye: No discharge.     Conjunctiva/sclera: Conjunctivae normal.  Neck:     Vascular: No JVD.     Trachea: No tracheal deviation.  Cardiovascular:     Rate and Rhythm: Normal rate.  Pulmonary:     Effort: Pulmonary effort is normal.     Comments: SPO2 saturations 100% on 3 L via nasal cannula which is patient's baseline Abdominal:     General: Bowel sounds are normal. There is no distension.     Palpations: Abdomen is soft.     Tenderness: There is no abdominal tenderness.     Hernia: No hernia is present.  Genitourinary:    Comments:  Examination performed in the presence of a chaperone.  There is frank rectal bleeding.  Moderate amount of black melanotic stool in the rectal vault. Skin:    General: Skin is warm and dry.     Findings: No erythema.  Neurological:     Mental Status: She is alert.  Psychiatric:        Behavior: Behavior normal.      ED Treatments / Results  Labs (all labs ordered are listed, but only abnormal results are displayed) Labs Reviewed  CBC WITH DIFFERENTIAL/PLATELET - Abnormal; Notable for the following components:      Result Value   RBC 2.93 (*)    Hemoglobin 8.9 (*)    HCT 28.6 (*)    All other components within normal limits  COMPREHENSIVE METABOLIC PANEL - Abnormal; Notable for the following components:   Glucose, Bld 102 (*)    BUN 37 (*)    Creatinine, Ser 7.71 (*)    Calcium 8.0 (*)    Total Protein 6.1 (*)    GFR calc non Af Amer 5 (*)    GFR calc Af Amer 5 (*)    All other  components within normal limits  POC OCCULT BLOOD, ED - Abnormal; Notable for the following components:   Fecal Occult Bld POSITIVE (*)    All other components within normal limits  SARS CORONAVIRUS 2 (HOSPITAL ORDER, [facility])  PROTIME-INR  BASIC METABOLIC PANEL  CBC  PROTIME-INR    EKG None  Radiology No results found.  Procedures Procedures (including critical care time)  Medications Ordered in ED Medications  pantoprazole (PROTONIX) injection 40 mg (has no administration in time range)  acetaminophen (TYLENOL) tablet 650 mg (has no administration in time range)    Or  acetaminophen (TYLENOL) suppository 650 mg (has no administration in time range)  ondansetron (ZOFRAN) tablet 4 mg (has no administration in time range)    Or  ondansetron (ZOFRAN) injection 4 mg (has no administration in time range)  albuterol (VENTOLIN HFA) 108 (90 Base) MCG/ACT inhaler 2 puff (has no administration in time range)  mometasone-formoterol (DULERA) 100-5 MCG/ACT inhaler 2  puff (has no administration in time range)  citalopram (CELEXA) tablet 40 mg (has no administration in time range)  ezetimibe (ZETIA) tablet 10 mg (has no administration in time range)  gabapentin (NEURONTIN) capsule 200 mg (has no administration in time range)  HYDROcodone-acetaminophen (NORCO/VICODIN) 5-325 MG per tablet 1 tablet (has no administration in time range)  levothyroxine (SYNTHROID) tablet 100 mcg (has no administration in time range)  midodrine (PROAMATINE) tablet 10 mg (has no administration in time range)  multivitamin (RENA-VIT) tablet 1 tablet (has no administration in time range)     Initial Impression / Assessment and Plan / ED Course  I have reviewed the triage vital signs and the nursing notes.  Pertinent labs & imaging results that were available during my care of the patient were reviewed by me and considered in my medical decision making (see chart for details).        Patient presenting for evaluation of blood in stools.  She is afebrile, vital signs are stable.  She is nontoxic in appearance.  She has frank rectal bleeding on examination, melanotic stools noted.  Abdominal examination is benign.  Lab work reviewed by me shows no leukocytosis.  She is anemic with a hemoglobin of 8.9, down from 11.13 months ago.  Does not require emergent blood transfusion at this time, currently hemodynamically stable.  BUN/creatinine consistent with her history of ESRD.  No indications for emergent dialysis today.  Point-of-care Hemoccult is positive. 10:40 PM Spoke with Dr. Fuller Plan with [facility]. Plan to see tomorrow.  10:50PM Spoke with Dr. Posey Pronto with [facility] hospitalist service who agrees to assume care of patient and bring her into the hospital for further evaluation and management at this time.  Final Clinical Impressions(s) / ED Diagnoses   Final diagnoses:  Anemia, unspecified type  Melena    ED Discharge Orders    None       Renita Papa, PA-C 08/20/18 0008     Varney Biles, MD 08/21/18 2100

## 2018-08-19 NOTE — ED Triage Notes (Signed)
Reports dark red rectal bleeding yesterday morning, when she tried to go to the bathroom she was constipated. Only happened upon bowel movements. No other symptoms. Today she has felt tired. Is a MWF dialysis pt. A&O x4.

## 2018-08-19 NOTE — ED Notes (Signed)
Healthcare POA (daughter in law) [place]:  [phone]

## 2018-08-19 NOTE — ED Notes (Signed)
ED TO INPATIENT HANDOFF REPORT  ED Nurse Name and Phone #:  Lonn Georgia  [phone]  S Name/Age/Gender [name] 78 y.o. female Room/Bed: 032C/032C  Code Status   Code Status: Prior  Home/SNF/Other Home Patient oriented to: self, place, time and situation Is this baseline? Yes   Triage Complete: Triage complete  Chief Complaint RECTAL BLEEDING  Triage Note Reports dark red rectal bleeding yesterday morning, when she tried to go to the bathroom she was constipated. Only happened upon bowel movements. No other symptoms. Today she has felt tired. Is a MWF dialysis pt. A&O x4.    Allergies Allergies  Allergen Reactions  . Amoxicillin Rash    Has patient had a PCN reaction causing immediate rash, facial/tongue/throat swelling, SOB or lightheadedness with hypotension:YES Has patient had a PCN reaction causing severe rash involving mucus membranes or skin necrosis: Yes Has patient had a PCN reaction that required hospitalization No Has patient had a PCN reaction occurring within the last 10 years: Yes If all of the above answers are "NO", then may proceed with Cephalosporin use.   . Sulfa Drugs Cross Reactors Hives and Itching  . Percocet [Oxycodone-Acetaminophen] Itching and Rash    Did not happen last time she took it  . Sulfa Antibiotics Hives    Level of Care/Admitting Diagnosis ED Disposition    None      B Medical/Surgery History Past Medical History:  Diagnosis Date  . A-fib ([place])   . Acute respiratory failure ([place]) 05/2017  . Anemia   . Anxiety   . Arthritis   . Depression   . Diabetes mellitus    "diet controlled"  . Diabetes mellitus without complication ([place])   . ESRD (end stage renal disease) ([place])    dialysis MWF  . GERD (gastroesophageal reflux disease)   . Gout   . History of blood transfusion   . HLD (hyperlipidemia)   . HOH (hard of hearing)    left ear  . Hypertension    hypotensive -since starting dialysis  . Hypothyroidism   . PAF  (paroxysmal atrial fibrillation) ([place])    a. Echo 11/16:  Mild LVH, EF 55-60%, normal wall motion, MAC, mild MR, severe LAE (49 ml/m2), mild RVE, normal RVSF, mild RAE, mild TR, PASP 24 mmHg;  CHADS2-VASc: 4 >> Coumadin followed by PCP  . Pneumonia   . Renal disorder   . Wears dentures   . Wears glasses    Past Surgical History:  Procedure Laterality Date  . AV FISTULA PLACEMENT  04/03/2012   Procedure: ARTERIOVENOUS (AV) FISTULA CREATION;  Surgeon: Elam Dutch, MD;  Location: [facility];  Service: Vascular;  Laterality: Left;  creation left brachial cephalic fistula   . CARPAL TUNNEL RELEASE Left   . COLONOSCOPY W/ POLYPECTOMY    . DILATION AND CURETTAGE OF UTERUS    . EYE SURGERY Bilateral    bilateral cataract removal  . HEMATOMA EVACUATION Left 05/14/2018   Procedure: Incision and Drainage of Left Arm Hematoma;  Surgeon: Elam Dutch, MD;  [facility];  Service: Vascular;  Laterality: Left;  . Hemodialysis  catheter Right   . IR FLUORO GUIDE CV LINE RIGHT  05/26/2017  . IR REMOVAL TUN CV CATH W/O FL  05/24/2017  . IR US GUIDE VASC ACCESS RIGHT  05/26/2017  . JOINT REPLACEMENT Bilateral    bilateral knee  . JOINT REPLACEMENT Right    shoulder  . LIGATION OF ARTERIOVENOUS  FISTULA Left 02/04/2015   Procedure:  LIGATION OF BRACHIOCEPHALIC ARTERIOVENOUS  FISTULA;  Surgeon: Conrad [place] Creek, MD;  Location: [facility];  Service: Vascular;  Laterality: Left;  Marland Kitchen MULTIPLE TOOTH EXTRACTIONS    . PORTACATH PLACEMENT    . REVERSE SHOULDER ARTHROPLASTY Left 01/22/2016   Procedure: LEFT REVERSE SHOULDER ARTHROPLASTY;  Surgeon: Netta Cedars, MD;  Location: [facility];  Service: Orthopedics;  Laterality: Left;  . SPLIT NIGHT STUDY  07/26/2015  . STERIOD INJECTION Right 01/22/2016   Procedure: RIGHT RING FINGER STEROID INJECTION;  Surgeon: Netta Cedars, MD;  Location: [facility];  Service: Orthopedics;  Laterality: Right;  . TEE WITHOUT CARDIOVERSION N/A 05/26/2017   Procedure: TRANSESOPHAGEAL ECHOCARDIOGRAM  (TEE);  Surgeon: Lelon Perla, MD;  Location: [facility];  Service: Cardiovascular;  Laterality: N/A;  . THROMBECTOMY BRACHIAL ARTERY Left 02/06/2015   Procedure: EVACUATION OF LEFT ARM HEMATOMA;  Surgeon: Angelia Mould, MD;  Location: [facility];  Service: Vascular;  Laterality: Left;  . TOTAL KNEE ARTHROPLASTY     right knee  . TUBAL LIGATION       A IV Location/Drains/Wounds Patient Lines/Drains/Airways Status   Active Line/Drains/Airways    Name:   Placement date:   Placement time:   Site:   Days:   Peripheral IV 08/19/18 Right;Anterior Arm   08/19/18    2033    Arm   less than 1   Hemodialysis Catheter Left Internal jugular Double-lumen   05/26/17    1304    Internal jugular   450   Incision (Closed) 05/14/18 Arm Anterior;Distal;Left;Upper   05/14/18    1657     97          Intake/Output Last 24 hours No intake or output data in the 24 hours ending 08/19/18 2247  Labs/Imaging Results for orders placed or performed during the hospital encounter of 08/19/18 (from the past 48 hour(s))  CBC with Differential     Status: Abnormal   Collection Time: 08/19/18  8:33 PM  Result Value Ref Range   WBC 7.1 4.0 - 10.5 K/uL   RBC 2.93 (L) 3.87 - 5.11 MIL/uL   Hemoglobin 8.9 (L) 12.0 - 15.0 g/dL   HCT 28.6 (L) 36.0 - 46.0 %   MCV 97.6 80.0 - 100.0 fL   MCH 30.4 26.0 - 34.0 pg   MCHC 31.1 30.0 - 36.0 g/dL   RDW 15.0 11.5 - 15.5 %   Platelets 214 150 - 400 K/uL   nRBC 0.0 0.0 - 0.2 %   Neutrophils Relative % 67 %   Neutro Abs 4.7 1.7 - 7.7 K/uL   Lymphocytes Relative 18 %   Lymphs Abs 1.3 0.7 - 4.0 K/uL   Monocytes Relative 9 %   Monocytes Absolute 0.7 0.1 - 1.0 K/uL   Eosinophils Relative 5 %   Eosinophils Absolute 0.4 0.0 - 0.5 K/uL   Basophils Relative 1 %   Basophils Absolute 0.1 0.0 - 0.1 K/uL   Immature Granulocytes 0 %   Abs Immature Granulocytes 0.02 0.00 - 0.07 K/uL    Comment: Performed at [facility], [address], [place], [place] [address]   Comprehensive metabolic panel     Status: Abnormal   Collection Time: 08/19/18  8:33 PM  Result Value Ref Range   Sodium 138 135 - 145 mmol/L   Potassium 4.2 3.5 - 5.1 mmol/L   Chloride 102 98 - 111 mmol/L   CO2 23 22 - 32 mmol/L   Glucose, Bld 102 (H) 70 - 99 mg/dL  BUN 37 (H) 8 - 23 mg/dL   Creatinine, Ser 7.71 (H) 0.44 - 1.00 mg/dL   Calcium 8.0 (L) 8.9 - 10.3 mg/dL   Total Protein 6.1 (L) 6.5 - 8.1 g/dL   Albumin 3.6 3.5 - 5.0 g/dL   AST 25 15 - 41 U/L   ALT 19 0 - 44 U/L   Alkaline Phosphatase 112 38 - 126 U/L   Total Bilirubin 0.6 0.3 - 1.2 mg/dL   GFR calc non Af Amer 5 (L) >60 mL/min   GFR calc Af Amer 5 (L) >60 mL/min   Anion gap 13 5 - 15    Comment: Performed at [facility] [address] [place], [place] [address]  POC occult blood, ED Provider will collect     Status: Abnormal   Collection Time: 08/19/18 10:00 PM  Result Value Ref Range   Fecal Occult Bld POSITIVE (A) NEGATIVE   No results found.  Pending Labs Unresulted Labs (From admission, onward)   None      Vitals/Pain Today's Vitals   08/19/18 2031 08/19/18 2100 08/19/18 2130 08/19/18 2200  BP: (!) 145/86 125/79 126/87 130/85  Pulse: 74 72 (!) 37 75  Resp: _0 Temp: 98.7 F (37.1 C)     TempSrc: Oral     SpO2: 100% 100% 100% 100%  PainSc:        Isolation Precautions No active isolations  Medications Medications - No data to display  Mobility walks with device Low fall risk   Focused Assessments    R Recommendations: See Admitting Provider Note  Report given to:   Additional Notes:

## 2018-08-19 NOTE — H&P (Signed)
History and Physical    [name] TGP:[id number] DOB: [birth year] DOA: 08/19/2018  PCP: Dione Housekeeper, MD  Patient coming from: Home  I have personally briefly reviewed patient's old medical records in [place]  Chief Complaint: Bleeding from rectum  HPI: [name] is a 78 y.o. female with medical history significant for ESRD on MWF HD, paroxysmal atrial fibrillation not on anticoagulation, COPD on 3 L supplemental O2 via [place] chronically, diet-controlled type 2 diabetes, hypertension, hyperlipidemia, hypothyroidism, depression, anxiety who presents to the ED with bleeding from her rectum.  Patient states she was in her usual state of health until yesterday morning when she noticed bright red blood mixed into her stool.  She reports 3 episodes yesterday and another episode of bloody stool earlier today.  She has also noticed dark black-colored stool today.  She has had some associated lightheadedness without fall or syncope.  She reports taking a low-dose aspirin daily but no other blood thinners.  She avoids NSAIDs.  She denies any associated fevers, chills, diaphoresis, nausea, vomiting, chest pain, dyspnea, abdominal pain, hemoptysis, hematemesis, epistaxis.  She says she still makes a small amount of urine and denies any dysuria or hematuria.  She attends dialysis every MWF with last session on 08/17/2018.  Patient had last colonoscopy on 05/12/2015 which showed multiple colon polyps, sigmoid diverticulosis, and internal hemorrhoids.  She also underwent upper endoscopy the same day which showed mild diffuse hemorrhagic gastropathy, small hiatal hernia, and a Schatzki ring at the GE junction.  At that time she was on both aspirin 81 mg daily and Coumadin.  ED Course:  Initial vitals showed BP 125/79, pulse 72, RR 19, temp 98.7 Fahrenheit, SPO2 100% on 2 L supplemental O2 via [place].  Labs are notable for hemoglobin 8.9 (previously 11.1 on 05/14/2018), MCV 97.6, hematocrit 28.6, platelets  214,000, WBC 7.1, BUN 37, creatinine 7.71, potassium 4.2.  Fecal occult blood test was positive.  SARS-CoV-2 test was obtained and pending.  The EDP discussed the case with on-call [facility] who recommended medical admission and that the GI team consult in the morning.  The hospitalist team was consulted to admit for further evaluation and management.  Review of Systems: All systems reviewed and are negative except as documented in history of present illness above.   Past Medical History:  Diagnosis Date  . A-fib ([place])   . Acute respiratory failure ([place]) 05/2017  . Anemia   . Anxiety   . Arthritis   . Depression   . Diabetes mellitus    "diet controlled"  . Diabetes mellitus without complication ([place])   . ESRD (end stage renal disease) ([place])    dialysis MWF  . GERD (gastroesophageal reflux disease)   . Gout   . History of blood transfusion   . HLD (hyperlipidemia)   . HOH (hard of hearing)    left ear  . Hypertension    hypotensive -since starting dialysis  . Hypothyroidism   . PAF (paroxysmal atrial fibrillation) ([place])    a. Echo 11/16:  Mild LVH, EF 55-60%, normal wall motion, MAC, mild MR, severe LAE (49 ml/m2), mild RVE, normal RVSF, mild RAE, mild TR, PASP 24 mmHg;  CHADS2-VASc: 4 >> Coumadin followed by PCP  . Pneumonia   . Renal disorder   . Wears dentures   . Wears glasses     Past Surgical History:  Procedure Laterality Date  . AV FISTULA PLACEMENT  04/03/2012   Procedure: ARTERIOVENOUS (AV) FISTULA CREATION;  Surgeon: Elam Dutch, MD;  Location: [facility];  Service: Vascular;  Laterality: Left;  creation left brachial cephalic fistula   . CARPAL TUNNEL RELEASE Left   . COLONOSCOPY W/ POLYPECTOMY    . DILATION AND CURETTAGE OF UTERUS    . EYE SURGERY Bilateral    bilateral cataract removal  . HEMATOMA EVACUATION Left 05/14/2018   Procedure: Incision and Drainage of Left Arm Hematoma;  Surgeon: Elam Dutch, MD;  Location: [facility] OR;  Service: Vascular;   Laterality: Left;  . Hemodialysis  catheter Right   . IR FLUORO GUIDE CV LINE RIGHT  05/26/2017  . IR REMOVAL TUN CV CATH W/O FL  05/24/2017  . IR US GUIDE VASC ACCESS RIGHT  05/26/2017  . JOINT REPLACEMENT Bilateral    bilateral knee  . JOINT REPLACEMENT Right    shoulder  . LIGATION OF ARTERIOVENOUS  FISTULA Left 02/04/2015   Procedure: LIGATION OF BRACHIOCEPHALIC ARTERIOVENOUS  FISTULA;  Surgeon: Conrad Hillcrest, MD;  Location: [facility];  Service: Vascular;  Laterality: Left;  Marland Kitchen MULTIPLE TOOTH EXTRACTIONS    . PORTACATH PLACEMENT    . REVERSE SHOULDER ARTHROPLASTY Left 01/22/2016   Procedure: LEFT REVERSE SHOULDER ARTHROPLASTY;  Surgeon: Netta Cedars, MD;  Location: [facility];  Service: Orthopedics;  Laterality: Left;  . SPLIT NIGHT STUDY  07/26/2015  . STERIOD INJECTION Right 01/22/2016   Procedure: RIGHT RING FINGER STEROID INJECTION;  Surgeon: Netta Cedars, MD;  Location: [facility];  Service: Orthopedics;  Laterality: Right;  . TEE WITHOUT CARDIOVERSION N/A 05/26/2017   Procedure: TRANSESOPHAGEAL ECHOCARDIOGRAM (TEE);  Surgeon: Lelon Perla, MD;  Location: [facility];  Service: Cardiovascular;  Laterality: N/A;  . THROMBECTOMY BRACHIAL ARTERY Left 02/06/2015   Procedure: EVACUATION OF LEFT ARM HEMATOMA;  Surgeon: Angelia Mould, MD;  Location: [facility];  Service: Vascular;  Laterality: Left;  . TOTAL KNEE ARTHROPLASTY     right knee  . TUBAL LIGATION      Social History:  reports that she quit smoking about 20 years ago. Her smoking use included cigarettes. She has a 0.50 pack-year smoking history. She has never used smokeless tobacco. She reports that she does not drink alcohol or use drugs.  Allergies  Allergen Reactions  . Amoxicillin Rash    Has patient had a PCN reaction causing immediate rash, facial/tongue/throat swelling, SOB or lightheadedness with hypotension:YES Has patient had a PCN reaction causing severe rash involving mucus membranes or skin necrosis: Yes Has patient had  a PCN reaction that required hospitalization No Has patient had a PCN reaction occurring within the last 10 years: Yes If all of the above answers are "NO", then may proceed with Cephalosporin use.   . Sulfa Drugs Cross Reactors Hives and Itching  . Percocet [Oxycodone-Acetaminophen] Itching and Rash    Did not happen last time she took it  . Sulfa Antibiotics Hives    Family History  Problem Relation Age of Onset  . Diabetes Father        before age 75  . Heart disease Father   . Diabetes Sister   . Cancer Brother   . Hyperlipidemia Daughter   . Hypertension Daughter   . Hypertension Son   . Lung cancer Sister   . Cancer Sister      Prior to Admission medications   Medication Sig Start Date End Date Taking? Authorizing Provider  ACCU-CHEK AVIVA PLUS test strip  11/11/14   [provider]  acetaminophen (TYLENOL) 500 MG tablet Take  1,000 mg by mouth as needed for mild pain or headache.    [provider]  albuterol (PROVENTIL HFA;VENTOLIN HFA) 108 (90 Base) MCG/ACT inhaler Inhale 2 puffs into the lungs every 6 (six) hours as needed for wheezing or shortness of breath.    [provider]  alendronate (FOSAMAX) 70 MG tablet Take 70 mg by mouth once a week.  08/07/17   [provider]  aspirin EC 81 MG tablet Take 81 mg by mouth daily.    [provider]  budesonide-formoterol (SYMBICORT) 80-4.5 MCG/ACT inhaler Inhale 2 puffs into the lungs 2 (two) times daily. 01/09/17   Tanda Rockers, MD  cinacalcet (SENSIPAR) 30 MG tablet Take 1 tablet (30 mg total) by mouth every Monday, Wednesday, and Friday. Patient taking differently: Take 30 mg by mouth See admin instructions. Mondays and fridays 05/28/17   Allie Bossier, MD  citalopram (CELEXA) 40 MG tablet Take 40 mg by mouth daily.  05/29/17   [provider]  colchicine 0.6 MG tablet Take 1 tablet (0.6 mg total) by mouth every 3 (three) days. Dose needs to be decreased to twice a week in  dialysis patient. 04/14/16   Debbe Odea, MD  doxercalciferol (HECTOROL) 4 MCG/2ML injection Inject 0.5 mLs (1 mcg total) into the vein every Monday, Wednesday, and Friday with hemodialysis. 05/29/17   Allie Bossier, MD  ezetimibe (ZETIA) 10 MG tablet Take 10 mg by mouth daily.     [provider]  Ferric Citrate (AURYXIA) 1 GM 210 MG(Fe) TABS Take 210 mg by mouth 3 (three) times daily before meals.     [provider]  gabapentin (NEURONTIN) 100 MG capsule Take 1 capsule (100 mg total) by mouth at bedtime. Patient taking differently: Take 200 mg by mouth at bedtime.  05/28/17   Allie Bossier, MD  HYDROcodone-acetaminophen (NORCO) 5-325 MG tablet Take 1 tablet by mouth every 6 (six) hours as needed for moderate pain. 01/22/16   Netta Cedars, MD  HYDROcodone-acetaminophen (NORCO/VICODIN) 5-325 MG tablet Take 1 tablet by mouth every 6 (six) hours as needed for moderate pain. 04/28/18   Fredia Sorrow, MD  levothyroxine (SYNTHROID, LEVOTHROID) 100 MCG tablet Take 100 mcg by mouth daily.      [provider]  LORazepam (ATIVAN) 0.5 MG tablet Take 0.5 mg by mouth at bedtime.  09/01/16   [provider]  midodrine (PROAMATINE) 10 MG tablet Take 1 tablet (10 mg total) by mouth See admin instructions. Take 1 tablet by mouth 3 times a day; expected on dialysis days Monday, Wednesday and Friday every 4 hours 05/22/18   Lendon Colonel, NP  multivitamin (RENA-VIT) TABS tablet Take 1 tablet by mouth daily.    [provider]  nystatin cream (MYCOSTATIN) Apply 1 application topically 2 (two) times daily.  03/16/18   [provider]  pantoprazole (PROTONIX) 40 MG tablet TAKE ONE TABLET BY MOUTH EVERY DAY 30-60MINUTES BEFORE FIRST MEAL OF THE DAY Patient taking differently: Take 40 mg by mouth daily.  08/09/17   Tanda Rockers, MD  ranitidine (ZANTAC) 150 MG tablet Take 150 mg by mouth 2 (two) times daily. 09/18/17   [provider]  vancomycin  IVPB Inject into the vein 3 (three) times a week.    [provider]  Vitamin D, Ergocalciferol, (DRISDOL) 50000 UNITS CAPS Take 50,000 Units by mouth every Monday.     [provider]    Physical Exam: Vitals:   08/19/18 2100 08/19/18  2130 08/19/18 2200 08/19/18 2230  BP: 125/79 126/87 130/85 114/82  Pulse: 72 (!) 37 75 68  Resp: _0 Temp:      TempSrc:      SpO2: 100% 100% 100% 100%    Constitutional: Elderly woman resting supine in bed, NAD, calm, comfortable Eyes: PERRL, lids and conjunctivae normal ENMT: Mucous membranes are moist. Posterior pharynx clear of any exudate or lesions. Neck: normal, supple, no masses. Respiratory: clear to auscultation bilaterally, no wheezing, no crackles. Normal respiratory effort. No accessory muscle use.  Cardiovascular: Regular rate and rhythm, no murmurs / rubs / gallops. No extremity edema. 2+ pedal pulses.  HD catheter in place left chest wall. Abdomen: no tenderness, no masses palpated. No hepatosplenomegaly. Bowel sounds positive.  Musculoskeletal: no clubbing / cyanosis. No joint deformity upper and lower extremities. Good ROM, no contractures. Normal muscle tone.  Skin: no rashes, lesions, ulcers. No induration Neurologic: CN 2-12 grossly intact. Sensation intact, Strength 5/5 in all 4.  Psychiatric: Normal judgment and insight. Alert and oriented x 3. Normal mood.     Labs on Admission: I have personally reviewed following labs and imaging studies  CBC: Recent Labs  Lab 08/19/18 2033  WBC 7.1  NEUTROABS 4.7  HGB 8.9*  HCT 28.6*  MCV 97.6  PLT 270   Basic Metabolic Panel: Recent Labs  Lab 08/19/18 2033  NA 138  K 4.2  CL 102  CO2 23  GLUCOSE 102*  BUN 37*  CREATININE 7.71*  CALCIUM 8.0*   GFR: CrCl cannot be calculated (Unknown ideal weight.). Liver Function Tests: Recent Labs  Lab 08/19/18 2033  AST 25  ALT 19  ALKPHOS 112  BILITOT 0.6  PROT 6.1*  ALBUMIN 3.6   No results for  input(s): LIPASE, AMYLASE in the last 168 hours. No results for input(s): AMMONIA in the last 168 hours. Coagulation Profile: No results for input(s): INR, PROTIME in the last 168 hours. Cardiac Enzymes: No results for input(s): CKTOTAL, CKMB, CKMBINDEX, TROPONINI in the last 168 hours. BNP (last 3 results) No results for input(s): PROBNP in the last 8760 hours. HbA1C: No results for input(s): HGBA1C in the last 72 hours. CBG: No results for input(s): GLUCAP in the last 168 hours. Lipid Profile: No results for input(s): CHOL, HDL, LDLCALC, TRIG, CHOLHDL, LDLDIRECT in the last 72 hours. Thyroid Function Tests: No results for input(s): TSH, T4TOTAL, FREET4, T3FREE, THYROIDAB in the last 72 hours. Anemia Panel: No results for input(s): VITAMINB12, FOLATE, FERRITIN, TIBC, IRON, RETICCTPCT in the last 72 hours. Urine analysis:    Component Value Date/Time   COLORURINE YELLOW 07/26/2013 1202   APPEARANCEUR CLEAR 07/26/2013 1202   LABSPEC 1.016 07/26/2013 1202   PHURINE 7.0 07/26/2013 1202   GLUCOSEU NEGATIVE 07/26/2013 1202   HGBUR SMALL (A) 07/26/2013 1202   BILIRUBINUR NEGATIVE 07/26/2013 1202   KETONESUR NEGATIVE 07/26/2013 1202   PROTEINUR 100 (A) 07/26/2013 1202   UROBILINOGEN 1.0 07/26/2013 1202   NITRITE NEGATIVE 07/26/2013 1202   LEUKOCYTESUR SMALL (A) 07/26/2013 1202    Radiological Exams on Admission: No results found.  EKG: Not performed.  Assessment/Plan Principal Problem:   Acute GI bleeding Active Problems:   ESRD (end stage renal disease) on dialysis ([facility])   PAF (paroxysmal atrial fibrillation) ([place])   Diabetes mellitus type 2, diet-controlled ([place])   Hypothyroidism   High cholesterol   Anemia   Depression  [name] is a 78 y.o. female with medical history significant for ESRD on  MWF HD, paroxysmal atrial fibrillation not on anticoagulation, COPD on 3 L supplemental O2 via [place] chronically, diet-controlled type 2 diabetes, hypertension, hyperlipidemia,  hypothyroidism, depression, anxiety with admitted for acute GI bleed.   Acute GI bleeding with acute on chronic anemia: Reported bright red blood per rectum as well as dark black appearing stool.  Question dark appearance from home iron supplementation.  Hemoglobin currently stable at 8.9 without further bloody bowel movement.  Last had upper and lower endoscopies in February 2017 which were notable for mild diffuse hemorrhagic gastropathy, multiple colon polyps, sigmoid diverticulosis, and internal hemorrhoids. -Start IV Protonix 40 mg twice daily for questionable melena/upper GI component -Hold home aspirin -Monitor for further signs/symptoms of bleeding, transfuse as needed to maintain hemoglobin >7.0 -GI consulted and to see in a.m.  ESRD on MWF HD: No current emergent need for HD.  Will be due for her usual dialysis on 08/20/2018.  Follows with Dr. Lorrene Reid with [facility]. -Consult nephrology in a.m. for usual dialysis -Continue Midodrine with HD  COPD on Chronic 3 L Supplemental O2: Currently stable without respiratory symptoms or wheezing. -Continue supplemental O2, Symbicort, and albuterol as needed  Paroxysmal atrial fibrillation: Regular rhythm on examination.  She is is no longer on anticoagulation.  Rates are currently controlled. -Continue to monitor  Diet-controlled type 2 diabetes: Last A1c 4.5 on 05/25/2017.  Continue to monitor.  Hypertension: Currently stable, not requiring antihypertensives.  Hyperlipidemia: -Continue Zetia  Hypothyroidism: -Continue home Synthroid  Depression and anxiety: -Continue home Celexa   DVT prophylaxis: SCDs  Code Status: DNR, confirmed with patient Family Communication: None present on admission Disposition Plan: Pending clinical progress Consults called: GI consulted by EDP, to see in a.m. Admission status: Observation   Zada Finders MD [facility]  If 7PM-7AM, please contact night-coverage [email or web address]   08/19/2018, 11:34 PM

## 2018-08-20 ENCOUNTER — Encounter (HOSPITAL_COMMUNITY): Payer: Self-pay | Admitting: *Deleted

## 2018-08-20 ENCOUNTER — Other Ambulatory Visit: Payer: Self-pay

## 2018-08-20 DIAGNOSIS — Z79890 Hormone replacement therapy (postmenopausal): Secondary | ICD-10-CM | POA: Diagnosis not present

## 2018-08-20 DIAGNOSIS — K649 Unspecified hemorrhoids: Secondary | ICD-10-CM | POA: Diagnosis not present

## 2018-08-20 DIAGNOSIS — D631 Anemia in chronic kidney disease: Secondary | ICD-10-CM | POA: Diagnosis present

## 2018-08-20 DIAGNOSIS — E1122 Type 2 diabetes mellitus with diabetic chronic kidney disease: Secondary | ICD-10-CM | POA: Diagnosis present

## 2018-08-20 DIAGNOSIS — E861 Hypovolemia: Secondary | ICD-10-CM

## 2018-08-20 DIAGNOSIS — E785 Hyperlipidemia, unspecified: Secondary | ICD-10-CM | POA: Diagnosis present

## 2018-08-20 DIAGNOSIS — Z7951 Long term (current) use of inhaled steroids: Secondary | ICD-10-CM | POA: Diagnosis not present

## 2018-08-20 DIAGNOSIS — K5731 Diverticulosis of large intestine without perforation or abscess with bleeding: Secondary | ICD-10-CM | POA: Diagnosis present

## 2018-08-20 DIAGNOSIS — M1990 Unspecified osteoarthritis, unspecified site: Secondary | ICD-10-CM | POA: Diagnosis present

## 2018-08-20 DIAGNOSIS — Z7982 Long term (current) use of aspirin: Secondary | ICD-10-CM | POA: Diagnosis not present

## 2018-08-20 DIAGNOSIS — K922 Gastrointestinal hemorrhage, unspecified: Secondary | ICD-10-CM | POA: Diagnosis not present

## 2018-08-20 DIAGNOSIS — D62 Acute posthemorrhagic anemia: Secondary | ICD-10-CM | POA: Diagnosis present

## 2018-08-20 DIAGNOSIS — K621 Rectal polyp: Secondary | ICD-10-CM | POA: Diagnosis not present

## 2018-08-20 DIAGNOSIS — K449 Diaphragmatic hernia without obstruction or gangrene: Secondary | ICD-10-CM | POA: Diagnosis not present

## 2018-08-20 DIAGNOSIS — K5730 Diverticulosis of large intestine without perforation or abscess without bleeding: Secondary | ICD-10-CM | POA: Diagnosis not present

## 2018-08-20 DIAGNOSIS — Z96651 Presence of right artificial knee joint: Secondary | ICD-10-CM | POA: Diagnosis present

## 2018-08-20 DIAGNOSIS — F329 Major depressive disorder, single episode, unspecified: Secondary | ICD-10-CM | POA: Diagnosis present

## 2018-08-20 DIAGNOSIS — E039 Hypothyroidism, unspecified: Secondary | ICD-10-CM | POA: Diagnosis present

## 2018-08-20 DIAGNOSIS — M109 Gout, unspecified: Secondary | ICD-10-CM | POA: Diagnosis present

## 2018-08-20 DIAGNOSIS — Z7983 Long term (current) use of bisphosphonates: Secondary | ICD-10-CM | POA: Diagnosis not present

## 2018-08-20 DIAGNOSIS — I480 Paroxysmal atrial fibrillation: Secondary | ICD-10-CM | POA: Diagnosis present

## 2018-08-20 DIAGNOSIS — H9190 Unspecified hearing loss, unspecified ear: Secondary | ICD-10-CM | POA: Diagnosis present

## 2018-08-20 DIAGNOSIS — F419 Anxiety disorder, unspecified: Secondary | ICD-10-CM | POA: Diagnosis present

## 2018-08-20 DIAGNOSIS — K921 Melena: Secondary | ICD-10-CM | POA: Diagnosis present

## 2018-08-20 DIAGNOSIS — I9589 Other hypotension: Secondary | ICD-10-CM

## 2018-08-20 DIAGNOSIS — K210 Gastro-esophageal reflux disease with esophagitis: Secondary | ICD-10-CM | POA: Diagnosis present

## 2018-08-20 DIAGNOSIS — K3189 Other diseases of stomach and duodenum: Secondary | ICD-10-CM | POA: Diagnosis not present

## 2018-08-20 DIAGNOSIS — Z20828 Contact with and (suspected) exposure to other viral communicable diseases: Secondary | ICD-10-CM | POA: Diagnosis present

## 2018-08-20 DIAGNOSIS — K635 Polyp of colon: Secondary | ICD-10-CM | POA: Diagnosis not present

## 2018-08-20 DIAGNOSIS — Z8261 Family history of arthritis: Secondary | ICD-10-CM | POA: Diagnosis not present

## 2018-08-20 DIAGNOSIS — Z96612 Presence of left artificial shoulder joint: Secondary | ICD-10-CM | POA: Diagnosis present

## 2018-08-20 DIAGNOSIS — N186 End stage renal disease: Secondary | ICD-10-CM | POA: Diagnosis present

## 2018-08-20 DIAGNOSIS — Z992 Dependence on renal dialysis: Secondary | ICD-10-CM

## 2018-08-20 DIAGNOSIS — I120 Hypertensive chronic kidney disease with stage 5 chronic kidney disease or end stage renal disease: Secondary | ICD-10-CM | POA: Diagnosis present

## 2018-08-20 DIAGNOSIS — E7800 Pure hypercholesterolemia, unspecified: Secondary | ICD-10-CM | POA: Diagnosis present

## 2018-08-20 DIAGNOSIS — K209 Esophagitis, unspecified: Secondary | ICD-10-CM | POA: Diagnosis not present

## 2018-08-20 LAB — CBC
HCT: 27 % — ABNORMAL LOW (ref 36.0–46.0)
HCT: 24.3 % — ABNORMAL LOW (ref 36.0–46.0)
Hemoglobin: 8.5 g/dL — ABNORMAL LOW (ref 12.0–15.0)
Hemoglobin: 7.5 g/dL — ABNORMAL LOW (ref 12.0–15.0)
MCH: 30.5 pg (ref 26.0–34.0)
MCH: 30 pg (ref 26.0–34.0)
MCHC: 31.5 g/dL (ref 30.0–36.0)
MCHC: 30.9 g/dL (ref 30.0–36.0)
MCV: 96.8 fL (ref 80.0–100.0)
MCV: 97.2 fL (ref 80.0–100.0)
Platelets: 207 10*3/uL (ref 150–400)
Platelets: 187 10*3/uL (ref 150–400)
RBC: 2.79 MIL/uL — ABNORMAL LOW (ref 3.87–5.11)
RBC: 2.5 MIL/uL — ABNORMAL LOW (ref 3.87–5.11)
RDW: 14.9 % (ref 11.5–15.5)
RDW: 14.9 % (ref 11.5–15.5)
WBC: 7.1 10*3/uL (ref 4.0–10.5)
WBC: 5.8 10*3/uL (ref 4.0–10.5)
nRBC: 0 % (ref 0.0–0.2)
nRBC: 0 % (ref 0.0–0.2)

## 2018-08-20 LAB — GLUCOSE, CAPILLARY: Glucose-Capillary: 72 mg/dL (ref 70–99)

## 2018-08-20 LAB — PROTIME-INR
INR: 1 (ref 0.8–1.2)
INR: 1 (ref 0.8–1.2)
Prothrombin Time: 12.9 seconds (ref 11.4–15.2)
Prothrombin Time: 13.1 seconds (ref 11.4–15.2)

## 2018-08-20 LAB — MRSA PCR SCREENING: MRSA by PCR: POSITIVE — AB

## 2018-08-20 LAB — BASIC METABOLIC PANEL
Anion gap: 11 (ref 5–15)
BUN: 39 mg/dL — ABNORMAL HIGH (ref 8–23)
CO2: 25 mmol/L (ref 22–32)
Calcium: 8.1 mg/dL — ABNORMAL LOW (ref 8.9–10.3)
Chloride: 105 mmol/L (ref 98–111)
Creatinine, Ser: 8.15 mg/dL — ABNORMAL HIGH (ref 0.44–1.00)
GFR calc Af Amer: 5 mL/min — ABNORMAL LOW (ref >60)
GFR calc non Af Amer: 4 mL/min — ABNORMAL LOW (ref >60)
Glucose, Bld: 81 mg/dL (ref 70–99)
Potassium: 4.9 mmol/L (ref 3.5–5.1)
Sodium: 141 mmol/L (ref 135–145)

## 2018-08-20 LAB — PREPARE RBC (CROSSMATCH)

## 2018-08-20 LAB — SARS CORONAVIRUS 2 BY RT PCR (HOSPITAL ORDER, PERFORMED IN ~~LOC~~ HOSPITAL LAB): SARS Coronavirus 2: NEGATIVE

## 2018-08-20 MED ORDER — PANTOPRAZOLE SODIUM 40 MG PO TBEC
40.0000 mg | DELAYED_RELEASE_TABLET | Freq: Every day | ORAL | Status: DC
  Administered 2018-08-20 – 2018-08-21 (×2): 40 mg via ORAL
  Filled 2018-08-20 (×2): qty 1

## 2018-08-20 MED ORDER — PEG-KCL-NACL-NASULF-NA ASC-C 100 G PO SOLR: 1.0000 | Freq: Once | ORAL | Status: DC

## 2018-08-20 MED ORDER — BISACODYL 5 MG PO TBEC
20.0000 mg | DELAYED_RELEASE_TABLET | Freq: Once | ORAL | Status: AC
  Administered 2018-08-20: 20 mg via ORAL
  Filled 2018-08-20: qty 4

## 2018-08-20 MED ORDER — LIDOCAINE-PRILOCAINE 2.5-2.5 % EX CREA: 1.0000 "application " | TOPICAL_CREAM | CUTANEOUS | Status: DC | PRN

## 2018-08-20 MED ORDER — PENTAFLUOROPROP-TETRAFLUOROETH EX AERO: 1.0000 "application " | INHALATION_SPRAY | CUTANEOUS | Status: DC | PRN

## 2018-08-20 MED ORDER — DOXERCALCIFEROL 4 MCG/2ML IV SOLN
4.0000 ug | INTRAVENOUS | Status: DC
  Administered 2018-08-20: 4 ug via INTRAVENOUS
  Filled 2018-08-20: qty 2

## 2018-08-20 MED ORDER — CHLORHEXIDINE GLUCONATE CLOTH 2 % EX PADS
6.0000 | MEDICATED_PAD | Freq: Every day | CUTANEOUS | Status: DC
  Administered 2018-08-21: 6 via TOPICAL

## 2018-08-20 MED ORDER — PEG-KCL-NACL-NASULF-NA ASC-C 100 G PO SOLR
0.5000 | Freq: Once | ORAL | Status: AC
  Administered 2018-08-21: 100 g via ORAL
  Filled 2018-08-20: qty 1

## 2018-08-20 MED ORDER — DOXERCALCIFEROL 4 MCG/2ML IV SOLN
INTRAVENOUS | Status: AC
  Filled 2018-08-20: qty 2

## 2018-08-20 MED ORDER — METOCLOPRAMIDE HCL 5 MG/ML IJ SOLN
10.0000 mg | Freq: Once | INTRAMUSCULAR | Status: AC
  Administered 2018-08-21: 10 mg via INTRAVENOUS
  Filled 2018-08-20: qty 2

## 2018-08-20 MED ORDER — PEG-KCL-NACL-NASULF-NA ASC-C 100 G PO SOLR
0.5000 | Freq: Once | ORAL | Status: AC
  Administered 2018-08-20: 100 g via ORAL
  Filled 2018-08-20: qty 1

## 2018-08-20 MED ORDER — CINACALCET HCL 30 MG PO TABS
30.0000 mg | ORAL_TABLET | Freq: Every day | ORAL | Status: DC
  Administered 2018-08-20 – 2018-08-21 (×2): 30 mg via ORAL
  Filled 2018-08-20 (×2): qty 1

## 2018-08-20 MED ORDER — SODIUM CHLORIDE 0.9 % IV SOLN: 100.0000 mL | INTRAVENOUS | Status: DC | PRN

## 2018-08-20 MED ORDER — METOCLOPRAMIDE HCL 5 MG/ML IJ SOLN
10.0000 mg | Freq: Once | INTRAMUSCULAR | Status: AC
  Administered 2018-08-20: 10 mg via INTRAVENOUS
  Filled 2018-08-20: qty 2

## 2018-08-20 MED ORDER — SODIUM CHLORIDE 0.9% IV SOLUTION
Freq: Once | INTRAVENOUS | Status: AC
  Administered 2018-08-20: 20:00:00 via INTRAVENOUS

## 2018-08-20 MED ORDER — MUPIROCIN 2 % EX OINT
1.0000 "application " | TOPICAL_OINTMENT | Freq: Two times a day (BID) | CUTANEOUS | Status: DC
  Administered 2018-08-20 – 2018-08-21 (×2): 1 via NASAL
  Filled 2018-08-20: qty 22

## 2018-08-20 MED ORDER — HEPARIN SODIUM (PORCINE) 1000 UNIT/ML IJ SOLN
INTRAMUSCULAR | Status: AC
  Administered 2018-08-20: 3800 [IU]
  Filled 2018-08-20: qty 4

## 2018-08-20 MED ORDER — CITALOPRAM HYDROBROMIDE 20 MG PO TABS
20.0000 mg | ORAL_TABLET | Freq: Every day | ORAL | Status: DC
  Administered 2018-08-21: 20 mg via ORAL
  Filled 2018-08-20 (×3): qty 1

## 2018-08-20 MED ORDER — LIDOCAINE HCL (PF) 1 % IJ SOLN: 5.0000 mL | INTRAMUSCULAR | Status: DC | PRN

## 2018-08-20 MED ORDER — CHLORHEXIDINE GLUCONATE CLOTH 2 % EX PADS: 6.0000 | MEDICATED_PAD | Freq: Every day | CUTANEOUS | Status: DC

## 2018-08-20 MED ORDER — ALBUTEROL SULFATE (2.5 MG/3ML) 0.083% IN NEBU: 2.5000 mg | INHALATION_SOLUTION | Freq: Four times a day (QID) | RESPIRATORY_TRACT | Status: DC | PRN

## 2018-08-20 NOTE — Procedures (Signed)
Patient was seen on dialysis and the procedure was supervised.  BFR 350  Via TDC BP is  82/54.   Patient appears to be tolerating treatment well  Louis Meckel 08/20/2018'

## 2018-08-20 NOTE — Consult Note (Addendum)
[facility] Renal Consultation Note    Indication for Consultation:  Management of ESRD/hemodialysis, anemia, hypertension/volume, and secondary hyperparathyroidism. PCP:  HPI: [name] is a 78 y.o. female with ESRD, HTN, Type 2 DM, paroxysmal A-fib (not on anticoagulation), COPD, and hypothyroidism who was admitted with GI bleed.  Pt reports that she noticed acute onset of red blood in BMs starting Saturday 5/16 - had 4 episodes prior to presenting to ED. No sharp abdominal pains. No fever or chills. In ED, found to have Hgb down to 8.9 (down from 10.4 on 5/13) with + FOBT. COVID testing done - negative. She was started on IV protonix and admitted. GI consulted today and possible plan for scope tomorrow.  Today, she looks well. Not passing blood today that she has noticed. No CP/dyspnea although is wearing nasal oxygen (her baseline). She dialyzes on MWF schedule - is due for dialysis today. No complaints when seen by me  Past Medical History:  Diagnosis Date  . A-fib ([place])   . Acute respiratory failure ([place]) 05/2017  . Anemia   . Anxiety   . Arthritis   . COPD (chronic obstructive pulmonary disease) ([place])   . Depression   . Diabetes mellitus    "diet controlled"  . Diabetes mellitus without complication ([place])   . ESRD (end stage renal disease) ([place])    dialysis MWF  . GERD (gastroesophageal reflux disease)   . Gout   . History of blood transfusion   . HLD (hyperlipidemia)   . HOH (hard of hearing)    left ear  . Hypertension    hypotensive -since starting dialysis  . Hypothyroidism   . PAF (paroxysmal atrial fibrillation) ([place])    a. Echo 11/16:  Mild LVH, EF 55-60%, normal wall motion, MAC, mild MR, severe LAE (49 ml/m2), mild RVE, normal RVSF, mild RAE, mild TR, PASP 24 mmHg;  CHADS2-VASc: 4 >> Coumadin followed by PCP  . Pneumonia   . Renal disorder   . Wears dentures   . Wears glasses    Past Surgical History:  Procedure Laterality Date  . AV  FISTULA PLACEMENT  04/03/2012   Procedure: ARTERIOVENOUS (AV) FISTULA CREATION;  Surgeon: Elam Dutch, MD;  Location: [facility];  Service: Vascular;  Laterality: Left;  creation left brachial cephalic fistula   . CARPAL TUNNEL RELEASE Left   . COLONOSCOPY W/ POLYPECTOMY    . DILATION AND CURETTAGE OF UTERUS    . EYE SURGERY Bilateral    bilateral cataract removal  . HEMATOMA EVACUATION Left 05/14/2018   Procedure: Incision and Drainage of Left Arm Hematoma;  Surgeon: Elam Dutch, MD;  Location: [facility] OR;  Service: Vascular;  Laterality: Left;  . Hemodialysis  catheter Right   . IR FLUORO GUIDE CV LINE RIGHT  05/26/2017  . IR REMOVAL TUN CV CATH W/O FL  05/24/2017  . IR US GUIDE VASC ACCESS RIGHT  05/26/2017  . JOINT REPLACEMENT Bilateral    bilateral knee  . JOINT REPLACEMENT Right    shoulder  . LIGATION OF ARTERIOVENOUS  FISTULA Left 02/04/2015   Procedure: LIGATION OF BRACHIOCEPHALIC ARTERIOVENOUS  FISTULA;  Surgeon: Conrad Whitsett, MD;  Location: [place];  Service: Vascular;  Laterality: Left;  Marland Kitchen MULTIPLE TOOTH EXTRACTIONS    . PORTACATH PLACEMENT    . REVERSE SHOULDER ARTHROPLASTY Left 01/22/2016   Procedure: LEFT REVERSE SHOULDER ARTHROPLASTY;  Surgeon: Netta Cedars, MD;  Location: [place];  Service: Orthopedics;  Laterality: Left;  . SPLIT  NIGHT STUDY  07/26/2015  . STERIOD INJECTION Right 01/22/2016   Procedure: RIGHT RING FINGER STEROID INJECTION;  Surgeon: Netta Cedars, MD;  Location: [address] [facility];  Service: Orthopedics;  Laterality: Right;  . TEE WITHOUT CARDIOVERSION N/A 05/26/2017   Procedure: TRANSESOPHAGEAL ECHOCARDIOGRAM (TEE);  Surgeon: Lelon Perla, MD;  Location: [place];  Service: Cardiovascular;  Laterality: N/A;  . THROMBECTOMY BRACHIAL ARTERY Left 02/06/2015   Procedure: EVACUATION OF LEFT ARM HEMATOMA;  Surgeon: Angelia Mould, MD;  Location: [place];  Service: Vascular;  Laterality: Left;  . TOTAL KNEE ARTHROPLASTY     right knee  . TUBAL LIGATION     Family  History  Problem Relation Age of Onset  . Arthritis Mother   . Diabetes Father        before age 65  . Heart disease Father   . Diabetes Sister   . Cancer Brother   . Hyperlipidemia Daughter   . Hypertension Daughter   . Hypertension Son   . Dementia Sister   . Cancer Sister   . Other Sister   . Stroke Brother    Social History:  reports that she quit smoking about 20 years ago. Her smoking use included cigarettes. She has a 0.50 pack-year smoking history. She has never used smokeless tobacco. She reports that she does not drink alcohol or use drugs.  ROS: As per HPI otherwise negative.  Physical Exam: Vitals:   08/20/18 0157 08/20/18 0211 08/20/18 0518 08/20/18 0719  BP:  126/76 112/75   Pulse:  66 70   Resp:  12 16   Temp:  98 F (36.7 C) 97.9 F (36.6 C)   TempSrc:  Oral    SpO2:  99% 100% 100%  Weight: 82.8 kg        General: Elderly woman, NAD, wearing nasal O2. Head: Normocephalic, atraumatic, sclera non-icteric, mucus membranes are moist. Neck: Supple without lymphadenopathy/masses. JVD not elevated. Lungs: Clear bilaterally to auscultation without wheezes, rales, or rhonchi. Breathing is unlabored. Heart: RRR with normal S1, S2. No murmurs, rubs, or gallops appreciated. Abdomen: Soft, non-tender, non-distended with normoactive bowel sounds. No rebound/guarding. Musculoskeletal:  Strength and tone appear normal for age. Lower extremities: No edema or ischemic changes, no open wounds. Neuro: Alert and oriented X 3. Moves all extremities spontaneously. Psych:  Responds to questions appropriately with a normal affect. Dialysis Access: TDC in R chest - non-tender.  Allergies  Allergen Reactions  . Amoxicillin Rash    Has patient had a PCN reaction causing immediate rash, facial/tongue/throat swelling, SOB or lightheadedness with hypotension:YES Has patient had a PCN reaction causing severe rash involving mucus membranes or skin necrosis: Yes Has patient had a PCN  reaction that required hospitalization No Has patient had a PCN reaction occurring within the last 10 years: Yes If all of the above answers are "NO", then may proceed with Cephalosporin use.   . Sulfa Drugs Cross Reactors Hives and Itching  . Percocet [Oxycodone-Acetaminophen] Itching and Rash    Did not happen last time she took it  . Sulfa Antibiotics Hives   Prior to Admission medications   Medication Sig Start Date End Date Taking? Authorizing Provider  ACCU-CHEK AVIVA PLUS test strip  11/11/14   [provider]  acetaminophen (TYLENOL) 500 MG tablet Take 1,000 mg by mouth as needed for mild pain or headache.    [provider]  albuterol (PROVENTIL HFA;VENTOLIN HFA) 108 (90 Base) MCG/ACT inhaler Inhale 2 puffs into the lungs every 6 (  six) hours as needed for wheezing or shortness of breath.    [provider]  alendronate (FOSAMAX) 70 MG tablet Take 70 mg by mouth once a week.  08/07/17   [provider]  aspirin EC 81 MG tablet Take 81 mg by mouth daily.    [provider]  budesonide-formoterol (SYMBICORT) 80-4.5 MCG/ACT inhaler Inhale 2 puffs into the lungs 2 (two) times daily. 01/09/17   Tanda Rockers, MD  cinacalcet (SENSIPAR) 30 MG tablet Take 1 tablet (30 mg total) by mouth every Monday, Wednesday, and Friday. Patient taking differently: Take 30 mg by mouth See admin instructions. Mondays and fridays 05/28/17   Allie Bossier, MD  citalopram (CELEXA) 40 MG tablet Take 40 mg by mouth daily.  05/29/17   [provider]  colchicine 0.6 MG tablet Take 1 tablet (0.6 mg total) by mouth every 3 (three) days. Dose needs to be decreased to twice a week in dialysis patient. 04/14/16   Debbe Odea, MD  doxercalciferol (HECTOROL) 4 MCG/2ML injection Inject 0.5 mLs (1 mcg total) into the vein every Monday, Wednesday, and Friday with hemodialysis. 05/29/17   Allie Bossier, MD  ezetimibe (ZETIA) 10 MG tablet Take 10 mg by mouth daily.      [provider]  Ferric Citrate (AURYXIA) 1 GM 210 MG(Fe) TABS Take 210 mg by mouth 3 (three) times daily before meals.     [provider]  gabapentin (NEURONTIN) 100 MG capsule Take 1 capsule (100 mg total) by mouth at bedtime. Patient taking differently: Take 200 mg by mouth at bedtime.  05/28/17   Allie Bossier, MD  HYDROcodone-acetaminophen (NORCO) 5-325 MG tablet Take 1 tablet by mouth every 6 (six) hours as needed for moderate pain. 01/22/16   Netta Cedars, MD  HYDROcodone-acetaminophen (NORCO/VICODIN) 5-325 MG tablet Take 1 tablet by mouth every 6 (six) hours as needed for moderate pain. 04/28/18   Fredia Sorrow, MD  levothyroxine (SYNTHROID, LEVOTHROID) 100 MCG tablet Take 100 mcg by mouth daily.      [provider]  LORazepam (ATIVAN) 0.5 MG tablet Take 0.5 mg by mouth at bedtime.  09/01/16   [provider]  midodrine (PROAMATINE) 10 MG tablet Take 1 tablet (10 mg total) by mouth See admin instructions. Take 1 tablet by mouth 3 times a day; expected on dialysis days Monday, Wednesday and Friday every 4 hours 05/22/18   Lendon Colonel, NP  multivitamin (RENA-VIT) TABS tablet Take 1 tablet by mouth daily.    [provider]  nystatin cream (MYCOSTATIN) Apply 1 application topically 2 (two) times daily.  03/16/18   [provider]  pantoprazole (PROTONIX) 40 MG tablet TAKE ONE TABLET BY MOUTH EVERY DAY 30-60MINUTES BEFORE FIRST MEAL OF THE DAY Patient taking differently: Take 40 mg by mouth daily.  08/09/17   Tanda Rockers, MD  ranitidine (ZANTAC) 150 MG tablet Take 150 mg by mouth 2 (two) times daily. 09/18/17   [provider]  vancomycin IVPB Inject into the vein 3 (three) times a week.    [provider]  Vitamin D, Ergocalciferol, (DRISDOL) 50000 UNITS CAPS Take 50,000 Units by mouth every Monday.     [provider]   Current Facility-Administered Medications  Medication Dose Route Frequency  Provider Last Rate Last Dose  . acetaminophen (TYLENOL) tablet 650 mg  650 mg Oral Q6H PRN Lenore Cordia, MD       Or  . acetaminophen (TYLENOL) suppository 650 mg  650 mg Rectal Q6H PRN Zada Finders R, MD      . albuterol (PROVENTIL) (2.5 MG/3ML) 0.083% nebulizer solution 2.5 mg  2.5 mg Nebulization Q6H PRN Lenore Cordia, MD      . Chlorhexidine Gluconate Cloth 2 % PADS 6 each  6 each Topical Q0600 Stovall, Woodfin Ganja, PA-C      . citalopram (CELEXA) tablet 40 mg  40 mg Oral Daily Zada Finders R, MD      . ezetimibe (ZETIA) tablet 10 mg  10 mg Oral Daily Patel, Vishal R, MD      . gabapentin (NEURONTIN) capsule 200 mg  200 mg Oral QHS Zada Finders R, MD   200 mg at 08/20/18 0328  . HYDROcodone-acetaminophen (NORCO/VICODIN) 5-325 MG per tablet 1 tablet  1 tablet Oral Q6H PRN Zada Finders R, MD      . levothyroxine (SYNTHROID) tablet 100 mcg  100 mcg Oral Q0600 Lenore Cordia, MD   100 mcg at 08/20/18 [phone]  . midodrine (PROAMATINE) tablet 10 mg  10 mg Oral Q M,W,F-HD Lenore Cordia, MD      . mometasone-formoterol [facility]) 100-5 MCG/ACT inhaler 2 puff  2 puff Inhalation BID Lenore Cordia, MD   2 puff at 08/20/18 0719  . multivitamin (RENA-VIT) tablet 1 tablet  1 tablet Oral Daily Zada Finders R, MD      . ondansetron (ZOFRAN) tablet 4 mg  4 mg Oral Q6H PRN Lenore Cordia, MD       Or  . ondansetron (ZOFRAN) injection 4 mg  4 mg Intravenous Q6H PRN Lenore Cordia, MD      . pantoprazole (PROTONIX) EC tablet 40 mg  40 mg Oral Q0600 Vena Rua, PA-C       Labs: Basic Metabolic Panel: Recent Labs  Lab 08/19/18 2033 08/20/18 0347  NA 138 141  K 4.2 4.9  CL 102 105  CO2 23 25  GLUCOSE 102* 81  BUN 37* 39*  CREATININE 7.71* 8.15*  CALCIUM 8.0* 8.1*   Liver Function Tests: Recent Labs  Lab 08/19/18 2033  AST 25  ALT 19  ALKPHOS 112  BILITOT 0.6  PROT 6.1*  ALBUMIN 3.6   CBC: Recent Labs  Lab 08/19/18 2033 08/20/18 0347  WBC 7.1 7.1  NEUTROABS 4.7  --    HGB 8.9* 8.5*  HCT 28.6* 27.0*  MCV 97.6 96.8  PLT 214 207   Dialysis Orders:  MWF at [facility] 4hr, 350/800, EDW 84kg, 2K/2.25Ca, no heparin, TDC - Mircera 36mg IV q 4 weeks - just ordered (last given 319m on 07/23/18) - Hectoral 32m36mIV q HD - Home binder: Auryxia 1/meals, Sensipar 47m27mS  Assessment/Plan: 1.  Acute GI bleed: Hgb 10.4 on 5/13 -> 8.9. GI consulted, possible scope tomorrow. 2.  ESRD: Will continue usual MWF schedule - for HD today via TDC, minimal UF. 3.  Hypertension/volume: BP controlled, no edema, no BP meds. Low UF goal, as tolerated. Is on midodrine  4.  Anemia (see #1):  5.  Metabolic bone disease: Ca ok. Phos pending. Resume home dose Sensipar/VDRA. Auryxia not ordered yet  6.  T2DM 7.  Hypothyroidism 8.  COPD  KatiPollyann Kennedy8/2020, 10:09 AM  [facility] Pager: [phone] seen and examined, agree with above note with above modifications. Pt known to us [place] [facility]. Noted [facility] over the weekend, eventually came to ER- found to have hgb drop from 10.4 on 5/13 to  in the 8's.  Plan is for colonoscopy tomorrow, routine HD today, hopefully can get done before needs to start prep  Corliss Parish, MD 08/20/2018

## 2018-08-20 NOTE — Consult Note (Signed)
[facility] Consult: 8:26 AM 08/20/2018  LOS: 0 days    Referring Provider: Dr Clementeen Graham  Primary Care Physician:  Dione Housekeeper, MD in [facility] Primary Gastroenterologist:  Dr. Loletha Carrow     Reason for Consultation:  GI bleed   HPI: [name] is a 78 y.o. female.  Hx ESRD.  Hemodialysis MWF.  Atrial for but, not on anticoagulation.  Anemia.  Hypothyroidism.  Diet controlled DM 2.  05/2015 EGD.  For anemia, FOBT positive dark stool.  Diffuse, mild hemorrhagic gastropathy.  Small HH.  Schatzki ring at GE junction.  Anemia felt due to ESRD and lesser component of GI blood loss from gastropathy 10/2015 colonoscopy.  Sigmoid diverticulosis.  Internal hemorrhoids.  5 polyps were removed, sized 2 to 10 mm.  Located in cecum, ascending and rectum.  Pathology on all of the polyps was tubular adenoma without high-grade dysplasia.  Surveillance colonoscopy suggested in 10/2018.  On the morning of 08/18/2018 she developed bright red blood mixed with her stool.  Without the stools were pure hematochezia.  She had 4 episodes during the day.  No abdominal pain, no nausea vomiting.  No recent change in bowel habits which normally are BMs every 1 to 2 days.  No significant weight fluctuations, anorexia, reflux symptoms.  The following day stool looked black-colored.  She was lightheaded but no syncopal episodes or falls reported.  No abdominal pain. Acid controlling medications include Protonix 40 mg daily and possibly Zantac 150 BID, which is listed but it is not clear the patient actually takes this.  Also takes 81 mg aspirin daily.   Hgb 8.9 >> 8.5.  MCV 96.  Platelets, PT/INR normal. FOBT positive. SARS/coronavirus 2 negative Has not received blood transfusion.  Today she feels well.  She had one dark stool yesterday.  Not feeling  dizzy today.  Eating well.  Patient lives with her son and daughter-in-law.  She does not drink alcohol.  Past Medical History:  Diagnosis Date  . A-fib ([place])   . Acute respiratory failure ([place]) 05/2017  . Anemia   . Anxiety   . Arthritis   . COPD (chronic obstructive pulmonary disease) ([place])   . Depression   . Diabetes mellitus    "diet controlled"  . Diabetes mellitus without complication ([place])   . ESRD (end stage renal disease) ([place])    dialysis MWF  . GERD (gastroesophageal reflux disease)   . Gout   . History of blood transfusion   . HLD (hyperlipidemia)   . HOH (hard of hearing)    left ear  . Hypertension    hypotensive -since starting dialysis  . Hypothyroidism   . PAF (paroxysmal atrial fibrillation) ([place])    a. Echo 11/16:  Mild LVH, EF 55-60%, normal wall motion, MAC, mild MR, severe LAE (49 ml/m2), mild RVE, normal RVSF, mild RAE, mild TR, PASP 24 mmHg;  CHADS2-VASc: 4 >> Coumadin followed by PCP  . Pneumonia   . Renal disorder   . Wears dentures   . Wears glasses     Past Surgical  History:  Procedure Laterality Date  . AV FISTULA PLACEMENT  04/03/2012   Procedure: ARTERIOVENOUS (AV) FISTULA CREATION;  Surgeon: Charles E Fields, MD;  Location: [facility] OR;  Service: Vascular;  Laterality: Left;  creation left brachial cephalic fistula   . CARPAL TUNNEL RELEASE Left   . COLONOSCOPY W/ POLYPECTOMY    . DILATION AND CURETTAGE OF UTERUS    . EYE SURGERY Bilateral    bilateral cataract removal  . HEMATOMA EVACUATION Left 05/14/2018   Procedure: Incision and Drainage of Left Arm Hematoma;  Surgeon: Fields, Charles E, MD;  Location: [facility] OR;  Service: Vascular;  Laterality: Left;  . Hemodialysis  catheter Right   . IR FLUORO GUIDE CV LINE RIGHT  05/26/2017  . IR REMOVAL TUN CV CATH W/O FL  05/24/2017  . IR US GUIDE VASC ACCESS RIGHT  05/26/2017  . JOINT REPLACEMENT Bilateral    bilateral knee  . JOINT REPLACEMENT Right    shoulder  . LIGATION OF ARTERIOVENOUS  FISTULA  Left 02/04/2015   Procedure: LIGATION OF BRACHIOCEPHALIC ARTERIOVENOUS  FISTULA;  Surgeon: Brian L Chen, MD;  Location: [facility] OR;  Service: Vascular;  Laterality: Left;  . MULTIPLE TOOTH EXTRACTIONS    . PORTACATH PLACEMENT    . REVERSE SHOULDER ARTHROPLASTY Left 01/22/2016   Procedure: LEFT REVERSE SHOULDER ARTHROPLASTY;  Surgeon: Steve Norris, MD;  Location: [facility] OR;  Service: Orthopedics;  Laterality: Left;  . SPLIT NIGHT STUDY  07/26/2015  . STERIOD INJECTION Right 01/22/2016   Procedure: RIGHT RING FINGER STEROID INJECTION;  Surgeon: Steve Norris, MD;  Location: [facility] OR;  Service: Orthopedics;  Laterality: Right;  . TEE WITHOUT CARDIOVERSION N/A 05/26/2017   Procedure: TRANSESOPHAGEAL ECHOCARDIOGRAM (TEE);  Surgeon: Crenshaw, Brian S, MD;  Location: [facility] ENDOSCOPY;  Service: Cardiovascular;  Laterality: N/A;  . THROMBECTOMY BRACHIAL ARTERY Left 02/06/2015   Procedure: EVACUATION OF LEFT ARM HEMATOMA;  Surgeon: Christopher S Dickson, MD;  Location: [facility] OR;  Service: Vascular;  Laterality: Left;  . TOTAL KNEE ARTHROPLASTY     right knee  . TUBAL LIGATION      Prior to Admission medications   Medication Sig Start Date End Date Taking? Authorizing Provider  ACCU-CHEK AVIVA PLUS test strip  11/11/14   [provider]  acetaminophen (TYLENOL) 500 MG tablet Take 1,000 mg by mouth as needed for mild pain or headache.    [provider]  albuterol (PROVENTIL HFA;VENTOLIN HFA) 108 (90 Base) MCG/ACT inhaler Inhale 2 puffs into the lungs every 6 (six) hours as needed for wheezing or shortness of breath.    [provider]  alendronate (FOSAMAX) 70 MG tablet Take 70 mg by mouth once a week.  08/07/17   [provider]  aspirin EC 81 MG tablet Take 81 mg by mouth daily.    [provider]  budesonide-formoterol (SYMBICORT) 80-4.5 MCG/ACT inhaler Inhale 2 puffs into the lungs 2 (two) times daily. 01/09/17   Wert, Michael B, MD  cinacalcet (SENSIPAR) 30 MG tablet Take 1  tablet (30 mg total) by mouth every Monday, Wednesday, and Friday. Patient taking differently: Take 30 mg by mouth See admin instructions. Mondays and fridays 05/28/17   Woods, Curtis J, MD  citalopram (CELEXA) 40 MG tablet Take 40 mg by mouth daily.  05/29/17   [provider]  colchicine 0.6 MG tablet Take 1 tablet (0.6 mg total) by mouth every 3 (three) days. Dose needs to be decreased to twice a week in dialysis patient. 04/14/16   Rizwan, Saima,   MD  doxercalciferol (HECTOROL) 4 MCG/2ML injection Inject 0.5 mLs (1 mcg total) into the vein every Monday, Wednesday, and Friday with hemodialysis. 05/29/17   Allie Bossier, MD  ezetimibe (ZETIA) 10 MG tablet Take 10 mg by mouth daily.     [provider]  Ferric Citrate (AURYXIA) 1 GM 210 MG(Fe) TABS Take 210 mg by mouth 3 (three) times daily before meals.     [provider]  gabapentin (NEURONTIN) 100 MG capsule Take 1 capsule (100 mg total) by mouth at bedtime. Patient taking differently: Take 200 mg by mouth at bedtime.  05/28/17   Allie Bossier, MD  HYDROcodone-acetaminophen (NORCO) 5-325 MG tablet Take 1 tablet by mouth every 6 (six) hours as needed for moderate pain. 01/22/16   Netta Cedars, MD  HYDROcodone-acetaminophen (NORCO/VICODIN) 5-325 MG tablet Take 1 tablet by mouth every 6 (six) hours as needed for moderate pain. 04/28/18   Fredia Sorrow, MD  levothyroxine (SYNTHROID, LEVOTHROID) 100 MCG tablet Take 100 mcg by mouth daily.      [provider]  LORazepam (ATIVAN) 0.5 MG tablet Take 0.5 mg by mouth at bedtime.  09/01/16   [provider]  midodrine (PROAMATINE) 10 MG tablet Take 1 tablet (10 mg total) by mouth See admin instructions. Take 1 tablet by mouth 3 times a day; expected on dialysis days Monday, Wednesday and Friday every 4 hours 05/22/18   Lendon Colonel, NP  multivitamin (RENA-VIT) TABS tablet Take 1 tablet by mouth daily.    [provider]  nystatin cream  (MYCOSTATIN) Apply 1 application topically 2 (two) times daily.  03/16/18   [provider]  pantoprazole (PROTONIX) 40 MG tablet TAKE ONE TABLET BY MOUTH EVERY DAY 30-60MINUTES BEFORE FIRST MEAL OF THE DAY Patient taking differently: Take 40 mg by mouth daily.  08/09/17   Tanda Rockers, MD  ranitidine (ZANTAC) 150 MG tablet Take 150 mg by mouth 2 (two) times daily. 09/18/17   [provider]  vancomycin IVPB Inject into the vein 3 (three) times a week.    [provider]  Vitamin D, Ergocalciferol, (DRISDOL) 50000 UNITS CAPS Take 50,000 Units by mouth every Monday.     [provider]    Scheduled Meds: . Chlorhexidine Gluconate Cloth  6 each Topical Q0600  . citalopram  40 mg Oral Daily  . ezetimibe  10 mg Oral Daily  . gabapentin  200 mg Oral QHS  . levothyroxine  100 mcg Oral Q0600  . midodrine  10 mg Oral Q M,W,F-HD  . mometasone-formoterol  2 puff Inhalation BID  . multivitamin  1 tablet Oral Daily  . pantoprazole (PROTONIX) IV  40 mg Intravenous Q12H   Infusions:  PRN Meds: acetaminophen **OR** acetaminophen, albuterol, HYDROcodone-acetaminophen, ondansetron **OR** ondansetron (ZOFRAN) IV   Allergies as of 08/19/2018 - Review Complete 08/19/2018  Allergen Reaction Noted  . Amoxicillin Rash 02/02/2011  . Sulfa drugs cross reactors Hives and Itching 02/02/2011  . Percocet [oxycodone-acetaminophen] Itching and Rash 02/02/2011  . Sulfa antibiotics Hives 09/05/2015    Family History  Problem Relation Age of Onset  . Arthritis Mother   . Diabetes Father        before age 64  . Heart disease Father   . Diabetes Sister   . Cancer Brother   . Hyperlipidemia Daughter   . Hypertension Daughter   . Hypertension Son   . Dementia Sister   . Cancer Sister   . Other Sister   .  Stroke Brother     Social History   Socioeconomic History  . Marital status: Widowed    Spouse name: Not on file  . Number of children: 6  . Years of education:  11  . Highest education level: 10th grade  Occupational History  . Occupation: RETIRED  Social Needs  . Financial resource strain: Not hard at all  . Food insecurity:    Worry: Never true    Inability: Never true  . Transportation needs:    Medical: No    Non-medical: No  Tobacco Use  . Smoking status: Former Smoker    Packs/day: 0.25    Years: 2.00    Pack years: 0.50    Types: Cigarettes    Last attempt to quit: 04/04/1998    Years since quitting: 20.3  . Smokeless tobacco: Never Used  Substance and Sexual Activity  . Alcohol use: No    Alcohol/week: 0.0 standard drinks  . Drug use: No  . Sexual activity: Not Currently    Birth control/protection: None  Lifestyle  . Physical activity:    Days per week: 0 days    Minutes per session: 0 min  . Stress: Not at all  Relationships  . Social connections:    Talks on phone: More than three times a week    Gets together: Three times a week    Attends religious service: More than 4 times per year    Active member of club or organization: No    Attends meetings of clubs or organizations: Never    Relationship status: Widowed  . Intimate partner violence:    Fear of current or ex partner: No    Emotionally abused: No    Physically abused: No    Forced sexual activity: No  Other Topics Concern  . Not on file  Social History Narrative   ** Merged History Encounter **       Widowed 6 children Lives with son and daughter in law homemaker    REVIEW OF SYSTEMS: Constitutional: Currently no weakness or fatigue. ENT:  No nose bleeds Pulm: No shortness of breath.  No cough.  No CPAP.  Uses oxygen 24/7 although she needs to get up to use the bathroom she will take the oxygen off to make it easier to get to the bathroom. CV:  No palpitations, no LE edema.  No chest pain. GU: Makes scant amounts of urine.  No hematuria, no frequency GI: See HPI. Heme: No unusual bleeding or bruising. Transfusions: He does not recall previous  transfusions with blood products. Neuro:  No headaches, no peripheral tingling or numbness Derm:  No itching, no rash or sores.  Endocrine:  No sweats or chills.  No polyuria or dysuria Immunization: Vaccination history reviewed.  She is up-to-date on multiple vaccinations. Travel:  None beyond local counties in last few months.    PHYSICAL EXAM: Vital signs in last 24 hours: Vitals:   08/20/18 0518 08/20/18 0719  BP: 112/75   Pulse: 70   Resp: 16   Temp: 97.9 F (36.6 C)   SpO2: 100% 100%   Wt Readings from Last 3 Encounters:  08/20/18 82.8 kg  05/31/18 87.1 kg  05/22/18 87.3 kg    General: Pleasant, elderly, comfortable, not ill-appearing WF. Head: Facial asymmetry or swelling.  No signs of head trauma. Eyes: No scleral icterus.  No conjunctival pallor.  EOMI. Ears: Not HOH. Nose: No discharge or congestion. Mouth: Edentulous.  Tongue midline.  Oral mucosa  pink, moist, clear. Neck: No JVD, no masses, no thyromegaly.  HD catheter at left IJ position. Lungs: A few coarse rales at the bases.  No cough.  No dyspnea. Heart: RRR.  No MRG.  S1, S2 present. Abdomen: Obese.  Soft.  Not distended.  No HSM, masses.  Bowel sounds active..   Rectal: Deferred. Musc/Skeltl: No joint redness, swelling or gross deformity. Extremities: No CCE. Neurologic: Alert.  Oriented x3.  Good historian.  Slight tremor in the hands and restless leg type movement in lower extremities. Skin: No rash, no sores, no telangiectasia, no bruising or purpura. Tattoos: None Nodes: No cervical adenopathy. Psych: Cooperative, calm, pleasant.  Fluid speech.  Intake/Output from previous day: 05/17 0701 - 05/18 0700 In: 60 [P.O.:60] Out: 0  Intake/Output this shift: No intake/output data recorded.  LAB RESULTS: Recent Labs    08/19/18 2033 08/20/18 0347  WBC 7.1 7.1  HGB 8.9* 8.5*  HCT 28.6* 27.0*  PLT 214 207   BMET Lab Results  Component Value Date   NA 141 08/20/2018   NA 138 08/19/2018    NA 140 05/14/2018   K 4.9 08/20/2018   K 4.2 08/19/2018   K 4.2 05/14/2018   CL 105 08/20/2018   CL 102 08/19/2018   CL 104 04/28/2018   CO2 25 08/20/2018   CO2 23 08/19/2018   CO2 27 04/28/2018   GLUCOSE 81 08/20/2018   GLUCOSE 102 (H) 08/19/2018   GLUCOSE 80 05/14/2018   BUN 39 (H) 08/20/2018   BUN 37 (H) 08/19/2018   BUN 19 04/28/2018   CREATININE 8.15 (H) 08/20/2018   CREATININE 7.71 (H) 08/19/2018   CREATININE 5.24 (H) 05/14/2018   CALCIUM 8.1 (L) 08/20/2018   CALCIUM 8.0 (L) 08/19/2018   CALCIUM 8.2 (L) 04/28/2018   LFT Recent Labs    08/19/18 2033  PROT 6.1*  ALBUMIN 3.6  AST 25  ALT 19  ALKPHOS 112  BILITOT 0.6   PT/INR Lab Results  Component Value Date   INR 1.0 08/20/2018   INR 1.0 08/19/2018   INR 2.95 10/04/2017    RADIOLOGY STUDIES: No results found.    IMPRESSION:   *    GI bleed.  Began as painless hematochezia.  Stool has since turned dark which may reflect old/retained blood. At colonoscopy 3 years ago she had multiple adenomatous colon polyps, diverticulosis, internal hemorrhoids, hemorrhagic gastritis.  *    ESRD.  MWF HD.    *   Oxygen dep COPD.    *    PAF.  Currently NSR.  Not on AC.      PLAN:     *   Colonoscopy?  EGD? Will d/w Dr Tarri Glenn.  Patient is not opposed to any scopes.  *   Ordered clear liquid diet in anticipation of possible colonoscopy tomorrow.  *    Switched to Protonix 56m po q day.    *   CBC in AM.     SAzucena Freed 08/20/2018, 8:26 AM Phone [phone]

## 2018-08-20 NOTE — Progress Notes (Signed)
Telephone orders received from Bennie Hind, [place].

## 2018-08-20 NOTE — Progress Notes (Signed)
Renal Navigator notified OP HD clinic of patient's admission and negative COVID 19 rapid test result to provide continuity of care and safety.  Alphonzo Cruise Renal Navigator [phone]

## 2018-08-20 NOTE — Progress Notes (Signed)
[facility] PROGRESS NOTE  [name] VOH:[id number] DOB: Oct 24, 1940 DOA: 08/19/2018 PCP: Dione Housekeeper, MD  Assessment/Plan:  Acute GI bleeding with acute on chronic anemia: no further BRBPR since admission. Hemoglobin remains stable at 8.5. evaluated by GI who opine possible colonoscopy tomorrow -change IV Protonix to po per GI -clear liquids  -Hold home aspirin -serial CBC keep Hg greater than 7  ESRD on MWF HD: MWF dialysis. Follows with Dr. Lorrene Reid with [facility]. -dialysis orders for today in -Continue Midodrine with HD  COPD on Chronic 3 L Supplemental O2:Currently stable without respiratory symptoms or wheezing. -Continue supplemental O2, Symbicort, and albuterol as needed  Paroxysmal atrial fibrillation: rate controlled.  She is is no longer on anticoagulation.   -Continue to monitor  Diet-controlled type 2 diabetes: Last A1c 4.5 on 05/25/2017.  Continue to monitor.  Hypertension: Currently stable, not requiring antihypertensives.  Hyperlipidemia: -Continue Zetia  Hypothyroidism: -Continue home Synthroid  Depression and anxiety: -Continue home Celexa   Code Status: dnr Family Communication:  Disposition Plan: home when ready   Consultants:  [facility] nephrology  [facility]  Procedures:  Dialysis 5/18>>>  Antibiotics:    HPI/Subjective: Awake alert denies pain/discomfort. Requesting food.  Admitted GI bleed. No further episodes  Objective: Vitals:   08/20/18 0518 08/20/18 0719  BP: 112/75   Pulse: 70   Resp: 16   Temp: 97.9 F (36.6 C)   SpO2: 100% 100%    Intake/Output Summary (Last 24 hours) at 08/20/2018 0941 Last data filed at 08/20/2018 0600 Gross per 24 hour  Intake 60 ml  Output 0 ml  Net 60 ml   Filed Weights   08/20/18 0157  Weight: 82.8 kg    Exam:   General:  Obese no acute distress denies pain  Cardiovascular: rrr no mgr no LE edema  Respiratory: normal effort BS clear bilaterally no  wheeze  Abdomen: +BS no guarding or rebounding  Musculoskeletal: joints without swelling/erythema moving all extremeties   Data Reviewed: Basic Metabolic Panel: Recent Labs  Lab 08/19/18 2033 08/20/18 0347  NA 138 141  K 4.2 4.9  CL 102 105  CO2 23 25  GLUCOSE 102* 81  BUN 37* 39*  CREATININE 7.71* 8.15*  CALCIUM 8.0* 8.1*   Liver Function Tests: Recent Labs  Lab 08/19/18 2033  AST 25  ALT 19  ALKPHOS 112  BILITOT 0.6  PROT 6.1*  ALBUMIN 3.6   No results for input(s): LIPASE, AMYLASE in the last 168 hours. No results for input(s): AMMONIA in the last 168 hours. CBC: Recent Labs  Lab 08/19/18 2033 08/20/18 0347  WBC 7.1 7.1  NEUTROABS 4.7  --   HGB 8.9* 8.5*  HCT 28.6* 27.0*  MCV 97.6 96.8  PLT 214 207   Cardiac Enzymes: No results for input(s): CKTOTAL, CKMB, CKMBINDEX, TROPONINI in the last 168 hours. BNP (last 3 results) No results for input(s): BNP in the last 8760 hours.  ProBNP (last 3 results) No results for input(s): PROBNP in the last 8760 hours.  CBG: Recent Labs  Lab 08/20/18 0218  GLUCAP 72    Recent Results (from the past 240 hour(s))  SARS Coronavirus 2 (CEPHEID - Performed in [facility] hospital lab), Hosp Order     Status: None   Collection Time: 08/19/18 11:30 PM  Result Value Ref Range Status   SARS Coronavirus 2 NEGATIVE NEGATIVE Final    Comment: (NOTE) If result is NEGATIVE SARS-CoV-2 target nucleic acids are NOT DETECTED. The SARS-CoV-2 RNA is generally  detectable in upper and lower  respiratory specimens during the acute phase of infection. The lowest  concentration of SARS-CoV-2 viral copies this assay can detect is 250  copies / mL. A negative result does not preclude SARS-CoV-2 infection  and should not be used as the sole basis for treatment or other  patient management decisions.  A negative result may occur with  improper specimen collection / handling, submission of specimen other  than nasopharyngeal swab,  presence of viral mutation(s) within the  areas targeted by this assay, and inadequate number of viral copies  (<250 copies / mL). A negative result must be combined with clinical  observations, patient history, and epidemiological information. If result is POSITIVE SARS-CoV-2 target nucleic acids are DETECTED. The SARS-CoV-2 RNA is generally detectable in upper and lower  respiratory specimens dur ing the acute phase of infection.  Positive  results are indicative of active infection with SARS-CoV-2.  Clinical  correlation with patient history and other diagnostic information is  necessary to determine patient infection status.  Positive results do  not rule out bacterial infection or co-infection with other viruses. If result is PRESUMPTIVE POSTIVE SARS-CoV-2 nucleic acids MAY BE PRESENT.   A presumptive positive result was obtained on the submitted specimen  and confirmed on repeat testing.  While 2019 novel coronavirus  (SARS-CoV-2) nucleic acids may be present in the submitted sample  additional confirmatory testing may be necessary for epidemiological  and / or clinical management purposes  to differentiate between  SARS-CoV-2 and other Sarbecovirus currently known to infect humans.  If clinically indicated additional testing with an alternate test  methodology [phone]) is advised. The SARS-CoV-2 RNA is generally  detectable in upper and lower respiratory sp ecimens during the acute  phase of infection. The expected result is Negative. Fact Sheet for Patients:  StrictlyIdeas.no Fact Sheet for Healthcare Providers: BankingDealers.co.za This test is not yet approved or cleared by the [place] FDA and has been authorized for detection and/or diagnosis of SARS-CoV-2 by FDA under an Emergency Use Authorization (EUA).  This EUA will remain in effect (meaning this test can be used) for the duration of the COVID-19 declaration under  Section 564(b)(1) of the Act, 21 U.S.C. section 360bbb-3(b)(1), unless the authorization is terminated or revoked sooner. Performed at [facility], [place] [address] [place], [place] [address]   MRSA PCR Screening     Status: Abnormal   Collection Time: 08/20/18  1:38 AM  Result Value Ref Range Status   MRSA by PCR POSITIVE (A) NEGATIVE Final    Comment:        The GeneXpert MRSA Assay (FDA approved for NASAL specimens only), is one component of a comprehensive MRSA colonization surveillance program. It is not intended to diagnose MRSA infection nor to guide or monitor treatment for MRSA infections. RESULT CALLED TO, READ BACK BY AND VERIFIED WITH: MINTZ,A RN 08/20/2018 AT 0303 SKEEN,P Performed at [facility], [place] [address] [place], [place] [address]      Studies: No results found.  Scheduled Meds: . Chlorhexidine Gluconate Cloth  6 each Topical Q0600  . citalopram  40 mg Oral Daily  . ezetimibe  10 mg Oral Daily  . gabapentin  200 mg Oral QHS  . levothyroxine  100 mcg Oral Q0600  . midodrine  10 mg Oral Q M,W,F-HD  . mometasone-formoterol  2 puff Inhalation BID  . multivitamin  1 tablet Oral Daily  . pantoprazole  40 mg Oral Q0600  Continuous Infusions:  Principal Problem:   Acute GI bleeding Active Problems:   ESRD (end stage renal disease) on dialysis (HCC)   PAF (paroxysmal atrial fibrillation) (HCC)   Diabetes mellitus type 2, diet-controlled (HCC)   Anemia   Hypothyroidism   High cholesterol   Depression    Time spent: 62 minutes    [place] NP  [facility]  If 7PM-7AM, please contact night-coverage at [email or web address], password [facility] 08/20/2018, 9:41 AM  LOS: 0 days

## 2018-08-20 NOTE — Progress Notes (Signed)
Called ER RN for report no answer.Will call back.  Room ready.

## 2018-08-20 NOTE — H&P (View-Only) (Signed)
[facility] Consult: 8:26 AM 08/20/2018  LOS: 0 days    Referring Provider: Dr Clementeen Graham  Primary Care Physician:  Dione Housekeeper, MD in [facility] Primary Gastroenterologist:  Dr. Loletha Carrow     Reason for Consultation:  GI bleed   HPI: [name] is a 78 y.o. female.  Hx ESRD.  Hemodialysis MWF.  Atrial for but, not on anticoagulation.  Anemia.  Hypothyroidism.  Diet controlled DM 2.  05/2015 EGD.  For anemia, FOBT positive dark stool.  Diffuse, mild hemorrhagic gastropathy.  Small HH.  Schatzki ring at GE junction.  Anemia felt due to ESRD and lesser component of GI blood loss from gastropathy 10/2015 colonoscopy.  Sigmoid diverticulosis.  Internal hemorrhoids.  5 polyps were removed, sized 2 to 10 mm.  Located in cecum, ascending and rectum.  Pathology on all of the polyps was tubular adenoma without high-grade dysplasia.  Surveillance colonoscopy suggested in 10/2018.  On the morning of 08/18/2018 she developed bright red blood mixed with her stool.  Without the stools were pure hematochezia.  She had 4 episodes during the day.  No abdominal pain, no nausea vomiting.  No recent change in bowel habits which normally are BMs every 1 to 2 days.  No significant weight fluctuations, anorexia, reflux symptoms.  The following day stool looked black-colored.  She was lightheaded but no syncopal episodes or falls reported.  No abdominal pain. Acid controlling medications include Protonix 40 mg daily and possibly Zantac 150 BID, which is listed but it is not clear the patient actually takes this.  Also takes 81 mg aspirin daily.   Hgb 8.9 >> 8.5.  MCV 96.  Platelets, PT/INR normal. FOBT positive. SARS/coronavirus 2 negative Has not received blood transfusion.  Today she feels well.  She had one dark stool yesterday.  Not feeling  dizzy today.  Eating well.  Patient lives with her son and daughter-in-law.  She does not drink alcohol.  Past Medical History:  Diagnosis Date  . A-fib ([place])   . Acute respiratory failure ([place]) 05/2017  . Anemia   . Anxiety   . Arthritis   . COPD (chronic obstructive pulmonary disease) ([place])   . Depression   . Diabetes mellitus    "diet controlled"  . Diabetes mellitus without complication ([place])   . ESRD (end stage renal disease) ([place])    dialysis MWF  . GERD (gastroesophageal reflux disease)   . Gout   . History of blood transfusion   . HLD (hyperlipidemia)   . HOH (hard of hearing)    left ear  . Hypertension    hypotensive -since starting dialysis  . Hypothyroidism   . PAF (paroxysmal atrial fibrillation) ([place])    a. Echo 11/16:  Mild LVH, EF 55-60%, normal wall motion, MAC, mild MR, severe LAE (49 ml/m2), mild RVE, normal RVSF, mild RAE, mild TR, PASP 24 mmHg;  CHADS2-VASc: 4 >> Coumadin followed by PCP  . Pneumonia   . Renal disorder   . Wears dentures   . Wears glasses     Past Surgical  History:  Procedure Laterality Date  . AV FISTULA PLACEMENT  04/03/2012   Procedure: ARTERIOVENOUS (AV) FISTULA CREATION;  Surgeon: Elam Dutch, MD;  Location: [place];  Service: Vascular;  Laterality: Left;  creation left brachial cephalic fistula   . CARPAL TUNNEL RELEASE Left   . COLONOSCOPY W/ POLYPECTOMY    . DILATION AND CURETTAGE OF UTERUS    . EYE SURGERY Bilateral    bilateral cataract removal  . HEMATOMA EVACUATION Left 05/14/2018   Procedure: Incision and Drainage of Left Arm Hematoma;  Surgeon: Elam Dutch, MD;  Location: [facility] OR;  Service: Vascular;  Laterality: Left;  . Hemodialysis  catheter Right   . IR FLUORO GUIDE CV LINE RIGHT  05/26/2017  . IR REMOVAL TUN CV CATH W/O FL  05/24/2017  . IR US GUIDE VASC ACCESS RIGHT  05/26/2017  . JOINT REPLACEMENT Bilateral    bilateral knee  . JOINT REPLACEMENT Right    shoulder  . LIGATION OF ARTERIOVENOUS  FISTULA  Left 02/04/2015   Procedure: LIGATION OF BRACHIOCEPHALIC ARTERIOVENOUS  FISTULA;  Surgeon: Conrad Coleman, MD;  Location: [place];  Service: Vascular;  Laterality: Left;  Marland Kitchen MULTIPLE TOOTH EXTRACTIONS    . PORTACATH PLACEMENT    . REVERSE SHOULDER ARTHROPLASTY Left 01/22/2016   Procedure: LEFT REVERSE SHOULDER ARTHROPLASTY;  Surgeon: Netta Cedars, MD;  Location: [place];  Service: Orthopedics;  Laterality: Left;  . SPLIT NIGHT STUDY  07/26/2015  . STERIOD INJECTION Right 01/22/2016   Procedure: RIGHT RING FINGER STEROID INJECTION;  Surgeon: Netta Cedars, MD;  Location: [place];  Service: Orthopedics;  Laterality: Right;  . TEE WITHOUT CARDIOVERSION N/A 05/26/2017   Procedure: TRANSESOPHAGEAL ECHOCARDIOGRAM (TEE);  Surgeon: Lelon Perla, MD;  Location: [place];  Service: Cardiovascular;  Laterality: N/A;  . THROMBECTOMY BRACHIAL ARTERY Left 02/06/2015   Procedure: EVACUATION OF LEFT ARM HEMATOMA;  Surgeon: Angelia Mould, MD;  Location: [place];  Service: Vascular;  Laterality: Left;  . TOTAL KNEE ARTHROPLASTY     right knee  . TUBAL LIGATION      Prior to Admission medications   Medication Sig Start Date End Date Taking? Authorizing Provider  ACCU-CHEK AVIVA PLUS test strip  11/11/14   [provider]  acetaminophen (TYLENOL) 500 MG tablet Take 1,000 mg by mouth as needed for mild pain or headache.    [provider]  albuterol (PROVENTIL HFA;VENTOLIN HFA) 108 (90 Base) MCG/ACT inhaler Inhale 2 puffs into the lungs every 6 (six) hours as needed for wheezing or shortness of breath.    [provider]  alendronate (FOSAMAX) 70 MG tablet Take 70 mg by mouth once a week.  08/07/17   [provider]  aspirin EC 81 MG tablet Take 81 mg by mouth daily.    [provider]  budesonide-formoterol (SYMBICORT) 80-4.5 MCG/ACT inhaler Inhale 2 puffs into the lungs 2 (two) times daily. 01/09/17   Tanda Rockers, MD  cinacalcet (SENSIPAR) 30 MG tablet Take 1  tablet (30 mg total) by mouth every Monday, Wednesday, and Friday. Patient taking differently: Take 30 mg by mouth See admin instructions. Mondays and fridays 05/28/17   Allie Bossier, MD  citalopram (CELEXA) 40 MG tablet Take 40 mg by mouth daily.  05/29/17   [provider]  colchicine 0.6 MG tablet Take 1 tablet (0.6 mg total) by mouth every 3 (three) days. Dose needs to be decreased to twice a week in dialysis patient. 04/14/16   Debbe Odea,  MD  doxercalciferol (HECTOROL) 4 MCG/2ML injection Inject 0.5 mLs (1 mcg total) into the vein every Monday, Wednesday, and Friday with hemodialysis. 05/29/17   Allie Bossier, MD  ezetimibe (ZETIA) 10 MG tablet Take 10 mg by mouth daily.     [provider]  Ferric Citrate (AURYXIA) 1 GM 210 MG(Fe) TABS Take 210 mg by mouth 3 (three) times daily before meals.     [provider]  gabapentin (NEURONTIN) 100 MG capsule Take 1 capsule (100 mg total) by mouth at bedtime. Patient taking differently: Take 200 mg by mouth at bedtime.  05/28/17   Allie Bossier, MD  HYDROcodone-acetaminophen (NORCO) 5-325 MG tablet Take 1 tablet by mouth every 6 (six) hours as needed for moderate pain. 01/22/16   Netta Cedars, MD  HYDROcodone-acetaminophen (NORCO/VICODIN) 5-325 MG tablet Take 1 tablet by mouth every 6 (six) hours as needed for moderate pain. 04/28/18   Fredia Sorrow, MD  levothyroxine (SYNTHROID, LEVOTHROID) 100 MCG tablet Take 100 mcg by mouth daily.      [provider]  LORazepam (ATIVAN) 0.5 MG tablet Take 0.5 mg by mouth at bedtime.  09/01/16   [provider]  midodrine (PROAMATINE) 10 MG tablet Take 1 tablet (10 mg total) by mouth See admin instructions. Take 1 tablet by mouth 3 times a day; expected on dialysis days Monday, Wednesday and Friday every 4 hours 05/22/18   Lendon Colonel, NP  multivitamin (RENA-VIT) TABS tablet Take 1 tablet by mouth daily.    [provider]  nystatin cream  (MYCOSTATIN) Apply 1 application topically 2 (two) times daily.  03/16/18   [provider]  pantoprazole (PROTONIX) 40 MG tablet TAKE ONE TABLET BY MOUTH EVERY DAY 30-60MINUTES BEFORE FIRST MEAL OF THE DAY Patient taking differently: Take 40 mg by mouth daily.  08/09/17   Tanda Rockers, MD  ranitidine (ZANTAC) 150 MG tablet Take 150 mg by mouth 2 (two) times daily. 09/18/17   [provider]  vancomycin IVPB Inject into the vein 3 (three) times a week.    [provider]  Vitamin D, Ergocalciferol, (DRISDOL) 50000 UNITS CAPS Take 50,000 Units by mouth every Monday.     [provider]    Scheduled Meds: . Chlorhexidine Gluconate Cloth  6 each Topical Q0600  . citalopram  40 mg Oral Daily  . ezetimibe  10 mg Oral Daily  . gabapentin  200 mg Oral QHS  . levothyroxine  100 mcg Oral Q0600  . midodrine  10 mg Oral Q M,W,F-HD  . mometasone-formoterol  2 puff Inhalation BID  . multivitamin  1 tablet Oral Daily  . pantoprazole (PROTONIX) IV  40 mg Intravenous Q12H   Infusions:  PRN Meds: acetaminophen **OR** acetaminophen, albuterol, HYDROcodone-acetaminophen, ondansetron **OR** ondansetron (ZOFRAN) IV   Allergies as of 08/19/2018 - Review Complete 08/19/2018  Allergen Reaction Noted  . Amoxicillin Rash 02/02/2011  . Sulfa drugs cross reactors Hives and Itching 02/02/2011  . Percocet [oxycodone-acetaminophen] Itching and Rash 02/02/2011  . Sulfa antibiotics Hives 09/05/2015    Family History  Problem Relation Age of Onset  . Arthritis Mother   . Diabetes Father        before age 69  . Heart disease Father   . Diabetes Sister   . Cancer Brother   . Hyperlipidemia Daughter   . Hypertension Daughter   . Hypertension Son   . Dementia Sister   . Cancer Sister   . Other Sister   .  Stroke Brother     Social History   Socioeconomic History  . Marital status: Widowed    Spouse name: Not on file  . Number of children: 6  . Years of education:  11  . Highest education level: 10th grade  Occupational History  . Occupation: RETIRED  Social Needs  . Financial resource strain: Not hard at all  . Food insecurity:    Worry: Never true    Inability: Never true  . Transportation needs:    Medical: No    Non-medical: No  Tobacco Use  . Smoking status: Former Smoker    Packs/day: 0.25    Years: 2.00    Pack years: 0.50    Types: Cigarettes    Last attempt to quit: 04/04/1998    Years since quitting: 20.3  . Smokeless tobacco: Never Used  Substance and Sexual Activity  . Alcohol use: No    Alcohol/week: 0.0 standard drinks  . Drug use: No  . Sexual activity: Not Currently    Birth control/protection: None  Lifestyle  . Physical activity:    Days per week: 0 days    Minutes per session: 0 min  . Stress: Not at all  Relationships  . Social connections:    Talks on phone: More than three times a week    Gets together: Three times a week    Attends religious service: More than 4 times per year    Active member of club or organization: No    Attends meetings of clubs or organizations: Never    Relationship status: Widowed  . Intimate partner violence:    Fear of current or ex partner: No    Emotionally abused: No    Physically abused: No    Forced sexual activity: No  Other Topics Concern  . Not on file  Social History Narrative   ** Merged History Encounter **       Widowed 6 children Lives with son and daughter in law homemaker    REVIEW OF SYSTEMS: Constitutional: Currently no weakness or fatigue. ENT:  No nose bleeds Pulm: No shortness of breath.  No cough.  No CPAP.  Uses oxygen 24/7 although she needs to get up to use the bathroom she will take the oxygen off to make it easier to get to the bathroom. CV:  No palpitations, no LE edema.  No chest pain. GU: Makes scant amounts of urine.  No hematuria, no frequency GI: See HPI. Heme: No unusual bleeding or bruising. Transfusions: He does not recall previous  transfusions with blood products. Neuro:  No headaches, no peripheral tingling or numbness Derm:  No itching, no rash or sores.  Endocrine:  No sweats or chills.  No polyuria or dysuria Immunization: Vaccination history reviewed.  She is up-to-date on multiple vaccinations. Travel:  None beyond local counties in last few months.    PHYSICAL EXAM: Vital signs in last 24 hours: Vitals:   08/20/18 0518 08/20/18 0719  BP: 112/75   Pulse: 70   Resp: 16   Temp: 97.9 F (36.6 C)   SpO2: 100% 100%   Wt Readings from Last 3 Encounters:  08/20/18 82.8 kg  05/31/18 87.1 kg  05/22/18 87.3 kg    General: Pleasant, elderly, comfortable, not ill-appearing WF. Head: Facial asymmetry or swelling.  No signs of head trauma. Eyes: No scleral icterus.  No conjunctival pallor.  EOMI. Ears: Not HOH. Nose: No discharge or congestion. Mouth: Edentulous.  Tongue midline.  Oral mucosa  pink, moist, clear. Neck: No JVD, no masses, no thyromegaly.  HD catheter at left IJ position. Lungs: A few coarse rales at the bases.  No cough.  No dyspnea. Heart: RRR.  No MRG.  S1, S2 present. Abdomen: Obese.  Soft.  Not distended.  No HSM, masses.  Bowel sounds active..   Rectal: Deferred. Musc/Skeltl: No joint redness, swelling or gross deformity. Extremities: No CCE. Neurologic: Alert.  Oriented x3.  Good historian.  Slight tremor in the hands and restless leg type movement in lower extremities. Skin: No rash, no sores, no telangiectasia, no bruising or purpura. Tattoos: None Nodes: No cervical adenopathy. Psych: Cooperative, calm, pleasant.  Fluid speech.  Intake/Output from previous day: 05/17 0701 - 05/18 0700 In: 60 [P.O.:60] Out: 0  Intake/Output this shift: No intake/output data recorded.  LAB RESULTS: Recent Labs    08/19/18 2033 08/20/18 0347  WBC 7.1 7.1  HGB 8.9* 8.5*  HCT 28.6* 27.0*  PLT 214 207   BMET Lab Results  Component Value Date   NA 141 08/20/2018   NA 138 08/19/2018    NA 140 05/14/2018   K 4.9 08/20/2018   K 4.2 08/19/2018   K 4.2 05/14/2018   CL 105 08/20/2018   CL 102 08/19/2018   CL 104 04/28/2018   CO2 25 08/20/2018   CO2 23 08/19/2018   CO2 27 04/28/2018   GLUCOSE 81 08/20/2018   GLUCOSE 102 (H) 08/19/2018   GLUCOSE 80 05/14/2018   BUN 39 (H) 08/20/2018   BUN 37 (H) 08/19/2018   BUN 19 04/28/2018   CREATININE 8.15 (H) 08/20/2018   CREATININE 7.71 (H) 08/19/2018   CREATININE 5.24 (H) 05/14/2018   CALCIUM 8.1 (L) 08/20/2018   CALCIUM 8.0 (L) 08/19/2018   CALCIUM 8.2 (L) 04/28/2018   LFT Recent Labs    08/19/18 2033  PROT 6.1*  ALBUMIN 3.6  AST 25  ALT 19  ALKPHOS 112  BILITOT 0.6   PT/INR Lab Results  Component Value Date   INR 1.0 08/20/2018   INR 1.0 08/19/2018   INR 2.95 10/04/2017    RADIOLOGY STUDIES: No results found.    IMPRESSION:   *    GI bleed.  Began as painless hematochezia.  Stool has since turned dark which may reflect old/retained blood. At colonoscopy 3 years ago she had multiple adenomatous colon polyps, diverticulosis, internal hemorrhoids, hemorrhagic gastritis.  *    ESRD.  MWF HD.    *   Oxygen dep COPD.    *    PAF.  Currently NSR.  Not on AC.      PLAN:     *   Colonoscopy?  EGD? Will d/w Dr Tarri Glenn.  Patient is not opposed to any scopes.  *   Ordered clear liquid diet in anticipation of possible colonoscopy tomorrow.  *    Switched to Protonix 79m po q day.    *   CBC in AM.     SAzucena Freed 08/20/2018, 8:26 AM Phone [phone]

## 2018-08-21 ENCOUNTER — Encounter (HOSPITAL_COMMUNITY)
Admission: EM | Disposition: A | Discharge status: Home Health | Payer: Self-pay | Source: Home / Self Care | Attending: Internal Medicine

## 2018-08-21 ENCOUNTER — Encounter (HOSPITAL_COMMUNITY): Payer: Self-pay | Admitting: Certified Registered Nurse Anesthetist

## 2018-08-21 ENCOUNTER — Inpatient Hospital Stay (HOSPITAL_COMMUNITY): Payer: Medicare Other | Admitting: Certified Registered Nurse Anesthetist

## 2018-08-21 DIAGNOSIS — K5730 Diverticulosis of large intestine without perforation or abscess without bleeding: Secondary | ICD-10-CM

## 2018-08-21 DIAGNOSIS — K259 Gastric ulcer, unspecified as acute or chronic, without hemorrhage or perforation: Secondary | ICD-10-CM | POA: Diagnosis present

## 2018-08-21 DIAGNOSIS — K621 Rectal polyp: Secondary | ICD-10-CM

## 2018-08-21 DIAGNOSIS — D62 Acute posthemorrhagic anemia: Secondary | ICD-10-CM | POA: Diagnosis present

## 2018-08-21 DIAGNOSIS — N186 End stage renal disease: Secondary | ICD-10-CM

## 2018-08-21 DIAGNOSIS — K3189 Other diseases of stomach and duodenum: Secondary | ICD-10-CM

## 2018-08-21 DIAGNOSIS — D123 Benign neoplasm of transverse colon: Secondary | ICD-10-CM | POA: Diagnosis present

## 2018-08-21 DIAGNOSIS — K449 Diaphragmatic hernia without obstruction or gangrene: Secondary | ICD-10-CM

## 2018-08-21 DIAGNOSIS — K209 Esophagitis, unspecified: Secondary | ICD-10-CM

## 2018-08-21 DIAGNOSIS — K635 Polyp of colon: Secondary | ICD-10-CM

## 2018-08-21 DIAGNOSIS — D124 Benign neoplasm of descending colon: Secondary | ICD-10-CM | POA: Diagnosis present

## 2018-08-21 DIAGNOSIS — Z992 Dependence on renal dialysis: Secondary | ICD-10-CM

## 2018-08-21 DIAGNOSIS — K649 Unspecified hemorrhoids: Secondary | ICD-10-CM

## 2018-08-21 HISTORY — PX: COLONOSCOPY WITH PROPOFOL: SHX5780

## 2018-08-21 HISTORY — PX: POLYPECTOMY: SHX5525

## 2018-08-21 HISTORY — PX: ESOPHAGOGASTRODUODENOSCOPY (EGD) WITH PROPOFOL: SHX5813

## 2018-08-21 HISTORY — PX: BIOPSY: SHX5522

## 2018-08-21 LAB — TYPE AND SCREEN
ABO/RH(D): O POS
Antibody Screen: NEGATIVE
Unit division: 0

## 2018-08-21 LAB — CBC
HCT: 29.5 % — ABNORMAL LOW (ref 36.0–46.0)
HCT: 27.4 % — ABNORMAL LOW (ref 36.0–46.0)
Hemoglobin: 9.3 g/dL — ABNORMAL LOW (ref 12.0–15.0)
Hemoglobin: 8.8 g/dL — ABNORMAL LOW (ref 12.0–15.0)
MCH: 30.3 pg (ref 26.0–34.0)
MCH: 30.6 pg (ref 26.0–34.0)
MCHC: 31.5 g/dL (ref 30.0–36.0)
MCHC: 32.1 g/dL (ref 30.0–36.0)
MCV: 96.1 fL (ref 80.0–100.0)
MCV: 95.1 fL (ref 80.0–100.0)
Platelets: 208 10*3/uL (ref 150–400)
Platelets: 203 10*3/uL (ref 150–400)
RBC: 3.07 MIL/uL — ABNORMAL LOW (ref 3.87–5.11)
RBC: 2.88 MIL/uL — ABNORMAL LOW (ref 3.87–5.11)
RDW: 15.2 % (ref 11.5–15.5)
RDW: 15 % (ref 11.5–15.5)
WBC: 8.9 10*3/uL (ref 4.0–10.5)
WBC: 6.6 10*3/uL (ref 4.0–10.5)
nRBC: 0 % (ref 0.0–0.2)
nRBC: 0 % (ref 0.0–0.2)

## 2018-08-21 LAB — BPAM RBC
Blood Product Expiration Date: 202006062359
ISSUE DATE / TIME: 202005181944
Unit Type and Rh: 5100

## 2018-08-21 LAB — BASIC METABOLIC PANEL
Anion gap: 14 (ref 5–15)
BUN: 11 mg/dL (ref 8–23)
CO2: 26 mmol/L (ref 22–32)
Calcium: 8.2 mg/dL — ABNORMAL LOW (ref 8.9–10.3)
Chloride: 99 mmol/L (ref 98–111)
Creatinine, Ser: 4.34 mg/dL — ABNORMAL HIGH (ref 0.44–1.00)
GFR calc Af Amer: 11 mL/min — ABNORMAL LOW (ref >60)
GFR calc non Af Amer: 9 mL/min — ABNORMAL LOW (ref >60)
Glucose, Bld: 91 mg/dL (ref 70–99)
Potassium: 3.8 mmol/L (ref 3.5–5.1)
Sodium: 139 mmol/L (ref 135–145)

## 2018-08-21 SURGERY — COLONOSCOPY WITH PROPOFOL
Anesthesia: Monitor Anesthesia Care

## 2018-08-21 SURGERY — ESOPHAGOGASTRODUODENOSCOPY (EGD) WITH PROPOFOL
Anesthesia: Monitor Anesthesia Care

## 2018-08-21 MED ORDER — DARBEPOETIN ALFA 200 MCG/0.4ML IJ SOSY: 200.0000 ug | PREFILLED_SYRINGE | INTRAMUSCULAR | Status: DC

## 2018-08-21 MED ORDER — SODIUM CHLORIDE 0.9 % IV SOLN
INTRAVENOUS | Status: DC | PRN
  Administered 2018-08-21: 11:00:00 via INTRAVENOUS

## 2018-08-21 MED ORDER — EPHEDRINE SULFATE-NACL 50-0.9 MG/10ML-% IV SOSY
PREFILLED_SYRINGE | INTRAVENOUS | Status: DC | PRN
  Administered 2018-08-21 (×4): 5 mg via INTRAVENOUS

## 2018-08-21 MED ORDER — SODIUM CHLORIDE 0.9 % IV SOLN
INTRAVENOUS | Status: AC | PRN
  Administered 2018-08-21: 500 mL via INTRAVENOUS

## 2018-08-21 MED ORDER — PHENYLEPHRINE 40 MCG/ML (10ML) SYRINGE FOR IV PUSH (FOR BLOOD PRESSURE SUPPORT)
PREFILLED_SYRINGE | INTRAVENOUS | Status: DC | PRN
  Administered 2018-08-21 (×2): 80 ug via INTRAVENOUS
  Administered 2018-08-21: 40 ug via INTRAVENOUS
  Administered 2018-08-21 (×2): 80 ug via INTRAVENOUS

## 2018-08-21 MED ORDER — PROPOFOL 500 MG/50ML IV EMUL
INTRAVENOUS | Status: DC | PRN
  Administered 2018-08-21: 100 ug/kg/min via INTRAVENOUS

## 2018-08-21 MED ORDER — PANTOPRAZOLE SODIUM 40 MG PO TBEC: 40.0000 mg | DELAYED_RELEASE_TABLET | Freq: Two times a day (BID) | ORAL | 1 refills | Status: DC

## 2018-08-21 MED ORDER — SUCRALFATE 1 G PO TABS: 1.0000 g | ORAL_TABLET | Freq: Four times a day (QID) | ORAL | 0 refills | Status: DC

## 2018-08-21 SURGICAL SUPPLY — 25 items

## 2018-08-21 NOTE — TOC Transition Note (Signed)
Transition of Care [facility]) - CM/SW Discharge Note   Patient Details  Name: [name] MRN: [id number] Date of Birth: 07/18/1940  [facility] CM/SW Contact:  Bartholomew Crews, RN Phone Number: [phone] 08/21/2018, 5:23 PM   Clinical Narrative:    Patient to transition home today. Will need HH order for RN and PT in order to resume services with [facility]. No other transition of care needs identified.   Final next level of care: Home w Home Health Services Barriers to Discharge: No Barriers Identified   Patient Goals and CMS Choice   CMS Medicare.gov Compare Post Acute Care list provided to:: Patient Choice offered to / list presented to : Patient  Discharge Placement                       Discharge Plan and Services                DME Arranged: N/A DME Agency: NA       HH Arranged: RN, PT [place] Agency: [place] ([place]) Date [place]: 08/21/18 Time [place]: 1722 Representative spoke with at [place]: Butch Penny (left message)  Social Determinants of Health (SDOH) Interventions     Readmission Risk Interventions No flowsheet data found.

## 2018-08-21 NOTE — Anesthesia Preprocedure Evaluation (Signed)
Anesthesia Evaluation  Patient identified by MRN, date of birth, ID band Patient awake    Reviewed: Allergy & Precautions, NPO status , Patient's Chart, lab work & pertinent test results  Airway Mallampati: II  TM Distance: >3 FB Neck ROM: Full    Dental  (+) Edentulous Upper, Edentulous Lower   Pulmonary asthma , COPD, former smoker,    Pulmonary exam normal        Cardiovascular hypertension, Normal cardiovascular exam+ dysrhythmias Atrial Fibrillation   Echo 05/01/18: IMPRESSIONS  1. The left ventricle appears to be normal in size, has mildly increased wall thickness, with 60-65%. Echo evidence of pseudonormal in diastolic filling patterns.  2. Right ventricular systolic pressure is moderately elevated.  3. The right ventricle is normal in size, has normal wall thickness and normal systolic function.  4. Moderately dilated left atrial size.  5. Mildly dilated right atrial size.  6. Aortic valve normal.  7. No atrial level shunt detected by color flow Doppler.  8. There is mild calcification and thickening of the mitral valve.  9. There is mild thickening and calcifications of the aortic valve. 10. Mitral valve regurgitation is mild by color flow Doppler. 11. Tricuspid regurgitation is mild. 12. The mitral valve is degenerative. 13. Moderate mitral annular calcification.   Neuro/Psych PSYCHIATRIC DISORDERS Anxiety Depression negative neurological ROS     GI/Hepatic Neg liver ROS, GERD  ,  Endo/Other  diabetesHypothyroidism Morbid obesity  Renal/GU ESRF and DialysisRenal disease     Musculoskeletal   Abdominal   Peds  Hematology  (+) anemia ,   Anesthesia Other Findings Acute GIB. Last hgb 9.3  Reproductive/Obstetrics                            Anesthesia Physical  Anesthesia Plan  ASA: III  Anesthesia Plan: MAC   Post-op Pain Management:    Induction: Intravenous  PONV Risk  Score and Plan: 2 and Propofol infusion and Treatment may vary due to age or medical condition  Airway Management Planned: Natural Airway and Simple Face Mask  Additional Equipment: None  Intra-op Plan:   Post-operative Plan:   Informed Consent: I have reviewed the patients History and Physical, chart, labs and discussed the procedure including the risks, benefits and alternatives for the proposed anesthesia with the patient or authorized representative who has indicated his/her understanding and acceptance.       Plan Discussed with:   Anesthesia Plan Comments:        Anesthesia Quick Evaluation                                  Anesthesia Evaluation  Patient identified by MRN, date of birth, ID band  Reviewed: Allergy & Precautions, NPO status , Patient's Chart, lab work & pertinent test results  Airway Mallampati: II  TM Distance: <3 FB Neck ROM: Full    Dental  (+) Edentulous Upper, Edentulous Lower   Pulmonary asthma , pneumonia, former smoker,    breath sounds clear to auscultation       Cardiovascular hypertension,  Rhythm:Regular Rate:Normal     Neuro/Psych    GI/Hepatic GERD  ,  Endo/Other  diabetesHypothyroidism   Renal/GU Renal disease     Musculoskeletal   Abdominal   Peds  Hematology   Anesthesia Other Findings   Reproductive/Obstetrics  Anesthesia Physical Anesthesia Plan  ASA: III  Anesthesia Plan: MAC   Post-op Pain Management:    Induction: Intravenous  PONV Risk Score and Plan: Treatment may vary due to age or medical condition  Airway Management Planned: Simple Face Mask and Mask  Additional Equipment:   Intra-op Plan:   Post-operative Plan:   Informed Consent: I have reviewed the patients History and Physical, chart, labs and discussed the procedure including the risks, benefits and alternatives for the proposed anesthesia with the patient or authorized  representative who has indicated his/her understanding and acceptance.   Dental advisory given  Plan Discussed with: CRNA, Anesthesiologist and Surgeon  Anesthesia Plan Comments:        Anesthesia Quick Evaluation

## 2018-08-21 NOTE — Op Note (Signed)
[facility] Patient Name: [name] Procedure Date : 08/21/2018 MRN: [id number] Attending MD: Thornton Park MD, MD Date of Birth: Feb 10, 1941 CSN: [id number] Age: 78 Admit Type: Inpatient Procedure:                Upper GI endoscopy Indications:              Acute post hemorrhagic anemia Providers:                Thornton Park MD, MD, Cleda Daub, RN, Marguerita Merles, Technician, Charolette Child, Technician,                            Garrison Columbus, CRNA Referring MD:              Medicines:                See the Anesthesia note for documentation of the                            administered medications Complications:            No immediate complications. Estimated blood loss:                            Minimal. Estimated Blood Loss:     Estimated blood loss was minimal. Procedure:                Pre-Anesthesia Assessment:                           - Prior to the procedure, a History and Physical                            was performed, and patient medications and                            allergies were reviewed. The patient's tolerance of                            previous anesthesia was also reviewed. The risks                            and benefits of the procedure and the sedation                            options and risks were discussed with the patient.                            All questions were answered, and informed consent                            was obtained. Prior Anticoagulants: The patient has                            taken  no previous anticoagulant or antiplatelet                            agents. ASA Grade Assessment: III - A patient with                            severe systemic disease. After reviewing the risks                            and benefits, the patient was deemed in                            satisfactory condition to undergo the procedure.                           After obtaining informed  consent, the endoscope was                            passed under direct vision. Throughout the                            procedure, the patient's blood pressure, pulse, and                            oxygen saturations were monitored continuously. The                            GIF-H190 ([id number]) Olympus gastroscope was                            introduced through the mouth, and advanced to the                            second part of duodenum. The upper GI endoscopy was                            accomplished without difficulty. The patient                            tolerated the procedure well. Scope In: Scope Out: Findings:      LA Grade C (one or more mucosal breaks continuous between tops of 2 or       more mucosal folds, less than 75% circumference) esophagitis with no       bleeding was found. Biopsies were taken with a cold forceps for       histology. Estimated blood loss was minimal.      A small hiatal hernia was present.      Multiple, small non-bleeding erosions were found in the gastric antrum.       There were no stigmata of recent bleeding. Biopsies were taken with a       cold forceps for histology. Estimated blood loss was minimal.      The examined duodenum was normal. Biopsies were taken with a cold       forceps for histology. Impression:               -  LA Grade C esophagitis. Biopsied.                           - Small hiatal hernia.                           - Non-bleeding erosive gastropathy. Biopsied.                           - Normal examined duodenum. Biopsied. Recommendation:           - Await pathology results.                           - Protonix 40 mg BID x 8 weeks.                           - Carafate slurry 1g QID x 2 weeks.                           - Keep the head of the bed upright as much as                            possible.                           - Proceed with colonoscopy today for further                            evaluation of  the bleeding. Procedure Code(s):        --- Professional ---                           [phone], Esophagogastroduodenoscopy, flexible,                            transoral; with biopsy, single or multiple Diagnosis Code(s):        --- Professional ---                           K20.9, Esophagitis, unspecified                           K44.9, Diaphragmatic hernia without obstruction or                            gangrene                           K31.89, Other diseases of stomach and duodenum                           D62, Acute posthemorrhagic anemia CPT copyright 2019 [employer]. All rights reserved. The codes documented in this report are preliminary and upon coder review may  be revised to meet current compliance requirements. Thornton Park MD, MD 08/21/2018 12:16:18 PM This report has been signed electronically. Number of Addenda: 0

## 2018-08-21 NOTE — Progress Notes (Addendum)
  [facility] Progress Note   Subjective:  Seen in room - finishing prep for colonoscopy. No CP/dyspnea/abd pain at this time.   Objective Vitals:   08/21/18 0505 08/21/18 0740 08/21/18 1011 08/21/18 1020  BP: 114/82  114/82 (!) 103/59  Pulse: 74  74   Resp: 19  19 16   Temp: 98 F (36.7 C)  98 F (36.7 C)   TempSrc: Oral  Oral Oral  SpO2: 100% 100%  95%  Weight:   79.3 kg 79.3 kg  Height:   5\' 5"  (1.651 m) 5\' 5"  (1.651 m)   Physical Exam General: Frail appearing woman, NAD Heart: RRR; no murmur Lungs: CTA anteriorly Abdomen: soft, non-tender Extremities: No LE edema Dialysis Access: TDC in R chest  Additional Objective Labs: Basic Metabolic Panel: Recent Labs  Lab 08/19/18 2033 08/20/18 0347 08/21/18 0558  NA 138 141 139  K 4.2 4.9 3.8  CL 102 105 99  CO2 23 25 26   GLUCOSE 102* 81 91  BUN 37* 39* 11  CREATININE 7.71* 8.15* 4.34*  CALCIUM 8.0* 8.1* 8.2*   Liver Function Tests: Recent Labs  Lab 08/19/18 2033  AST 25  ALT 19  ALKPHOS 112  BILITOT 0.6  PROT 6.1*  ALBUMIN 3.6   CBC: Recent Labs  Lab 08/19/18 2033 08/20/18 0347 08/20/18 1029 08/21/18 0558  WBC 7.1 7.1 5.8 8.9  NEUTROABS 4.7  --   --   --   HGB 8.9* 8.5* 7.5* 9.3*  HCT 28.6* 27.0* 24.3* 29.5*  MCV 97.6 96.8 97.2 96.1  PLT 214 207 187 208   Medications:  . [MAR Hold] Chlorhexidine Gluconate Cloth  6 each Topical Q0600  . [MAR Hold] Chlorhexidine Gluconate Cloth  6 each Topical Q0600  . [MAR Hold] cinacalcet  30 mg Oral Q supper  . [MAR Hold] citalopram  20 mg Oral Daily  . [MAR Hold] doxercalciferol  4 mcg Intravenous Q M,W,F-HD  . [MAR Hold] ezetimibe  10 mg Oral Daily  . [MAR Hold] gabapentin  200 mg Oral QHS  . [MAR Hold] levothyroxine  100 mcg Oral Q0600  . [MAR Hold] midodrine  10 mg Oral Q M,W,F-HD  . [MAR Hold] mometasone-formoterol  2 puff Inhalation BID  . [MAR Hold] multivitamin  1 tablet Oral Daily  . [MAR Hold] mupirocin ointment  1 application Nasal  BID  . [MAR Hold] pantoprazole  40 mg Oral Q0600     MWF  [facility]   350/800   84kg   2K/2.25Ca  Hep none   TDC - Mircera 23mcg IV q 4 weeks - just ordered (last given 36mcg on 07/23/18) - Hectoral 68mcg IV q HD - Home binder: Auryxia 1/meals, Sensipar 30mg  QHS  Assessment/Plan: 1.  Acute GI bleed: Hgb 10.4 on 5/13 -> 8.9 -> 7.5. GI consulted, for colonoscopy today. 2.  ESRD: Will continue usual MWF schedule - next 5/20 via TDC, minimal UF. 3.  Hypotension/volume: BP controlled, no edema, no BP meds. On midodrine  4.  Anemia (see #1): S/p 1U PRBC 5/18. Will give Aranesp 265mcg tomorrow. 5.  Metabolic bone disease: Ca ok. Phos pending. Resume home dose Sensipar/VDRA. Auryxia on hold as she is NPO - resume with diet. 6.  T2DM 7.  Hypothyroidism 8.  COPD   Pollyann Kennedy 08/21/2018, 10:25 AM  [facility] Pager: [phone]  Pt seen, examined and agree w A/P as above.  Kelly Splinter  MD 08/21/2018, 1:07 PM

## 2018-08-21 NOTE — Discharge Instructions (Signed)
High-Fiber Diet Fiber, also called dietary fiber, is a type of carbohydrate that is found in fruits, vegetables, whole grains, and beans. A high-fiber diet can have many health benefits. Your health care provider may recommend a high-fiber diet to help:  Prevent constipation. Fiber can make your bowel movements more regular.  Lower your cholesterol.  Relieve the following conditions: ? Swelling of veins in the anus (hemorrhoids). ? Swelling and irritation (inflammation) of specific areas of the digestive tract (uncomplicated diverticulosis). ? A problem of the large intestine (colon) that sometimes causes pain and diarrhea (irritable bowel syndrome, IBS).  Prevent overeating as part of a weight-loss plan.  Prevent heart disease, type 2 diabetes, and certain cancers. What is my plan? The recommended daily fiber intake in grams (g) includes:  38 g for men age 60 or younger.  30 g for men over age 55.  45 g for women age 54 or younger.  21 g for women over age 81. You can get the recommended daily intake of dietary fiber by:  Eating a variety of fruits, vegetables, grains, and beans.  Taking a fiber supplement, if it is not possible to get enough fiber through your diet. What do I need to know about a high-fiber diet?  It is better to get fiber through food sources rather than from fiber supplements. There is not a lot of research about how effective supplements are.  Always check the fiber content on the nutrition facts label of any prepackaged food. Look for foods that contain 5 g of fiber or more per serving.  Talk with a diet and nutrition specialist (dietitian) if you have questions about specific foods that are recommended or not recommended for your medical condition, especially if those foods are not listed below.  Gradually increase how much fiber you consume. If you increase your intake of dietary fiber too quickly, you may have bloating, cramping, or gas.  Drink plenty  of water. Water helps you to digest fiber. What are tips for following this plan?  Eat a wide variety of high-fiber foods.  Make sure that half of the grains that you eat each day are whole grains.  Eat breads and cereals that are made with whole-grain flour instead of refined flour or white flour.  Eat brown rice, bulgur wheat, or millet instead of white rice.  Start the day with a breakfast that is high in fiber, such as a cereal that contains 5 g of fiber or more per serving.  Use beans in place of meat in soups, salads, and pasta dishes.  Eat high-fiber snacks, such as berries, raw vegetables, nuts, and popcorn.  Choose whole fruits and vegetables instead of processed forms like juice or sauce. What foods can I eat?  Fruits Berries. Pears. Apples. Oranges. Avocado. Prunes and raisins. Dried figs. Vegetables Sweet potatoes. Spinach. Kale. Artichokes. Cabbage. Broccoli. Cauliflower. Green peas. Carrots. Squash. Grains Whole-grain breads. Multigrain cereal. Oats and oatmeal. Brown rice. Barley. Bulgur wheat. Garden. Quinoa. Bran muffins. Popcorn. Rye wafer crackers. Meats and other proteins Navy, kidney, and pinto beans. Soybeans. Split peas. Lentils. Nuts and seeds. Dairy Fiber-fortified yogurt. Beverages Fiber-fortified soy milk. Fiber-fortified orange juice. Other foods Fiber bars. The items listed above may not be a complete list of recommended foods and beverages. Contact a dietitian for more options. What foods are not recommended? Fruits Fruit juice. Cooked, strained fruit. Vegetables Fried potatoes. Canned vegetables. Well-cooked vegetables. Grains White bread. Pasta made with refined flour. White rice. Meats and other  proteins Fatty cuts of meat. Fried chicken or fried fish. Dairy Milk. Yogurt. Cream cheese. Sour cream. Fats and oils Butters. Beverages Soft drinks. Other foods Cakes and pastries. The items listed above may not be a complete list of foods  and beverages to avoid. Contact a dietitian for more information. Summary  Fiber is a type of carbohydrate. It is found in fruits, vegetables, whole grains, and beans.  There are many health benefits of eating a high-fiber diet, such as preventing constipation, lowering blood cholesterol, helping with weight loss, and reducing your risk of heart disease, diabetes, and certain cancers.  Gradually increase your intake of fiber. Increasing too fast can result in cramping, bloating, and gas. Drink plenty of water while you increase your fiber.  The best sources of fiber include whole fruits and vegetables, whole grains, nuts, seeds, and beans. This information is not intended to replace advice given to you by your health care provider. Make sure you discuss any questions you have with your health care provider. Document Released: 03/21/2005 Document Revised: 01/23/2017 Document Reviewed: 01/23/2017 Elsevier Interactive Patient Education  2019 [employer] Colon Polyps  Polyps are tissue growths inside the body. Polyps can grow in many places, including the large intestine (colon). A polyp may be a round bump or a mushroom-shaped growth. You could have one polyp or several. Most colon polyps are noncancerous (benign). However, some colon polyps can become cancerous over time. Finding and removing the polyps early can help prevent this. What are the causes? The exact cause of colon polyps is not known. What increases the risk? You are more likely to develop this condition if you:  Have a family history of colon cancer or colon polyps.  Are older than 59 or older than 45 if you are African American.  Have inflammatory bowel disease, such as ulcerative colitis or Crohn's disease.  Have certain hereditary conditions, such as: ? Familial adenomatous polyposis. ? Lynch syndrome. ? Turcot syndrome. ? Peutz-Jeghers syndrome.  Are overweight.  Smoke cigarettes.  Do not get enough  exercise.  Drink too much alcohol.  Eat a diet that is high in fat and red meat and low in fiber.  Had childhood cancer that was treated with abdominal radiation. What are the signs or symptoms? Most polyps do not cause symptoms. If you have symptoms, they may include:  Blood coming from your rectum when having a bowel movement.  Blood in your stool. The stool may look dark red or black.  Abdominal pain.  A change in bowel habits, such as constipation or diarrhea. How is this diagnosed? This condition is diagnosed with a colonoscopy. This is a procedure in which a lighted, flexible scope is inserted into the anus and then passed into the colon to examine the area. Polyps are sometimes found when a colonoscopy is done as part of routine cancer screening tests. How is this treated? Treatment for this condition involves removing any polyps that are found. Most polyps can be removed during a colonoscopy. Those polyps will then be tested for cancer. Additional treatment may be needed depending on the results of testing. Follow these instructions at home: Lifestyle  Maintain a healthy weight, or lose weight if recommended by your health care provider.  Exercise every day or as told by your health care provider.  Do not use any products that contain nicotine or tobacco, such as cigarettes and e-cigarettes. If you need help quitting, ask your health care provider.  If you drink alcohol, limit  how much you have: ? 0-1 drink a day for women. ? 0-2 drinks a day for men.  Be aware of how much alcohol is in your drink. In the [place] one drink equals one 12 oz bottle of beer (355 mL), one 5 oz glass of wine (148 mL), or one 1 oz shot of hard liquor (44 mL). Eating and drinking   Eat foods that are high in fiber, such as fruits, vegetables, and whole grains.  Eat foods that are high in calcium and vitamin D, such as milk, cheese, yogurt, eggs, liver, fish, and broccoli.  Limit foods that  are high in fat, such as fried foods and desserts.  Limit the amount of red meat and processed meat you eat, such as hot dogs, sausage, bacon, and lunch meats. General instructions  Keep all follow-up visits as told by your health care provider. This is important. ? This includes having regularly scheduled colonoscopies. ? Talk to your health care provider about when you need a colonoscopy. Contact a health care provider if:  You have new or worsening bleeding during a bowel movement.  You have new or increased blood in your stool.  You have a change in bowel habits.  You lose weight for no known reason. Summary  Polyps are tissue growths inside the body. Polyps can grow in many places, including the colon.  Most colon polyps are noncancerous (benign), but some can become cancerous over time.  This condition is diagnosed with a colonoscopy.  Treatment for this condition involves removing any polyps that are found. Most polyps can be removed during a colonoscopy. This information is not intended to replace advice given to you by your health care provider. Make sure you discuss any questions you have with your health care provider. Document Released: 12/16/2003 Document Revised: 07/06/2017 Document Reviewed: 07/06/2017 Elsevier Interactive Patient Education  2019 [place] Gastrointestinal Bleeding  Lower gastrointestinal (GI) bleeding is the result of bleeding from the colon, rectum, or anal area. The colon is the last part of the digestive tract, where stool, also called feces, is formed. If you have lower GI bleeding, you may see blood in or on your stool. It may be bright red. Lower GI bleeding often stops without treatment. Continued or heavy bleeding needs emergency treatment at the hospital. What are the causes? Lower GI bleeding may be caused by:  A condition that causes pouches to form in the colon over time (diverticulosis).  Swelling and irritation  (inflammation) in areas with diverticulosis (diverticulitis).  Inflammation of the colon (inflammatory bowel disease).  Swollen veins in the rectum (hemorrhoids).  Painful tears in the anus (anal fissures), often caused by passing hard stools.  Cancer of the colon or rectum.  Noncancerous growths (polyps) of the colon or rectum.  A bleeding disorder that impairs the formation of blood clots and causes easy bleeding (coagulopathy).  An abnormal weakening of a blood vessel where an artery and a vein come together (arteriovenous malformation). What increases the risk? You are more likely to develop this condition if:  You are older than 78 years of age.  You take aspirin or NSAIDs on a regular basis.  You take anticoagulant or antiplatelet drugs.  You have a history of high-dose X-ray treatment (radiation therapy) of the colon.  You recently had a colon polyp removed. What are the signs or symptoms? Symptoms of this condition include:  Bright red blood or blood clots coming from your rectum.  Bloody stools.  Black  or maroon-colored stools.  Pain or cramping in the abdomen.  Weakness or dizziness.  Racing heartbeat. How is this diagnosed? This condition may be diagnosed based on:  Your symptoms and medical history.  A physical exam. During the exam, your health care provider will check for signs of blood loss, such as low blood pressure and a rapid pulse.  Tests, such as: ? Flexible sigmoidoscopy. In this procedure, a flexible tube with a camera on the end is used to examine your anus and the first part of your colon to look for the source of bleeding. ? Colonoscopy. This is similar to a flexible sigmoidoscopy, but the camera can extend all the way to the uppermost part of your colon. ? Blood tests to measure your red blood cell count and to check for coagulopathy. ? An imaging study of your colon to look for a bleeding site. In some cases, you may have X-rays taken  after a dye or radioactive substance is injected into your bloodstream (angiogram). How is this treated? Treatment for this condition depends on the cause of the bleeding. Heavy or persistent bleeding is treated at the hospital. Treatment may include:  Getting fluids through an IV tube inserted into one of your veins.  Getting blood through an IV tube (blood transfusion).  Stopping bleeding through high-heat coagulation, injections of certain medicines, or applying surgical clips. This can all be done during a colonoscopy.  Having a procedure that involves first doing an angiogram and then blocking blood flow to the bleeding site (embolization).  Stopping some of your regular medicines for a certain amount of time.  Having surgery to remove part of the colon. This may be needed if bleeding is severe and does not respond to other treatment. Follow these instructions at home:  Take over-the-counter and prescription medicines only as told by your health care provider. You may need to avoid aspirin, NSAIDs, or other medicines that increase bleeding.  Eat foods that are high in fiber. This will help keep your stools soft. These foods include whole grains, legumes, fruits, and vegetables. Eating 1-3 prunes each day works well for many people.  Drink enough fluid to keep your urine clear or pale yellow.  Keep all follow-up visits as told by your health care provider. This is important. Contact a health care provider if:  Your symptoms do not improve. Get help right away if:  Your bleeding increases.  You feel light-headed or you faint.  You feel weak.  You have severe cramps in your back or abdomen.  You pass large blood clots in your stool.  Your symptoms get worse. This information is not intended to replace advice given to you by your health care provider. Make sure you discuss any questions you have with your health care provider. Document Released: 08/06/2015 Document Revised:  08/27/2015 Document Reviewed: 08/06/2015 Elsevier Interactive Patient Education  2019 [employer].

## 2018-08-21 NOTE — Interval H&P Note (Signed)
History and Physical Interval Note:  08/21/2018 11:18 AM  [name]  has presented today for surgery, with the diagnosis of Anemia.  Hematochezia..  The various methods of treatment have been discussed with the patient and family. After consideration of risks, benefits and other options for treatment, the patient has consented to  Procedure(s): COLONOSCOPY WITH PROPOFOL (N/A) ESOPHAGOGASTRODUODENOSCOPY (EGD) WITH PROPOFOL (N/A) as a surgical intervention.  The patient's history has been reviewed, patient examined, no change in status, stable for surgery.  I have reviewed the patient's chart and labs.  Questions were answered to the patient's satisfaction.     Thornton Park

## 2018-08-21 NOTE — Discharge Summary (Signed)
Physician Discharge Summary  [name] VZD:[id number] DOB: 1940-10-16 DOA: 08/19/2018  PCP: Dione Housekeeper, MD  Admit date: 08/19/2018 Discharge date: 08/21/2018  Admitted From: Home Disposition:  Home  Recommendations for Outpatient Follow-up:  1. Follow up with PCP in 1 week 2. Please obtain BMP/CBC in one week 3. Please follow up on the following pending results: Pathology from biopsy during EGD and colonoscopy 4. Patient will continue Protonix 40 mg p.o. twice daily x8 weeks and Carafate 1 g 4 times daily x2 weeks per GI recommendations.  Encouraged increased fiber in her diet.  Home Health: No Equipment/Devices: Continue home oxygen at 3 L per nasal cannula which is her baseline  Discharge Condition: Stable CODE STATUS: DNR Diet recommendation: Renal diet, high-fiber  History of present illness:  [name] is a 78 y.o. female with medical history significant for ESRD on MWF HD, paroxysmal atrial fibrillation not on anticoagulation, COPD on 3 L supplemental O2 via [place] chronically, diet-controlled type 2 diabetes, hypertension, hyperlipidemia, hypothyroidism, depression, anxiety who presents to the ED with bleeding from her rectum.  Patient states she was in her usual state of health until yesterday morning when she noticed bright red blood mixed into her stool.  She reports 3 episodes yesterday and another episode of bloody stool earlier today.  She has also noticed dark black-colored stool today.  She has had some associated lightheadedness without fall or syncope.  She reports taking a low-dose aspirin daily but no other blood thinners.  She avoids NSAIDs.  She denies any associated fevers, chills, diaphoresis, nausea, vomiting, chest pain, dyspnea, abdominal pain, hemoptysis, hematemesis, epistaxis.  She says she still makes a small amount of urine and denies any dysuria or hematuria.  She attends dialysis every MWF with last session on 08/17/2018.  Patient had last  colonoscopy on 05/12/2015 which showed multiple colon polyps, sigmoid diverticulosis, and internal hemorrhoids.  She also underwent upper endoscopy the same day which showed mild diffuse hemorrhagic gastropathy, small hiatal hernia, and a Schatzki ring at the GE junction.  At that time she was on both aspirin 81 mg daily and Coumadin.  ED Course:  Initial vitals showed BP 125/79, pulse 72, RR 19, temp 98.7 Fahrenheit, SPO2 100% on 2 L supplemental O2 via [place].  Labs are notable for hemoglobin 8.9 (previously 11.1 on 05/14/2018), MCV 97.6, hematocrit 28.6, platelets 214,000, WBC 7.1, BUN 37, creatinine 7.71, potassium 4.2.  Fecal occult blood test was positive.  SARS-CoV-2 test was obtained and pending.  The EDP discussed the case with on-call [facility] who recommended medical admission and that the GI team consult in the morning.  The hospitalist team was consulted to admit for further evaluation and management.  Hospital course:  Symptomatic anemia Acute blood loss anemia Lower GI bleed likely secondary to diverticular bleed F patient presenting from home with complaints of bright red stool per rectum with associated lightheadedness.  Patient on low-dose aspirin but no other blood thinners or anticoagulants.  Denies history of NSAID abuse.  On admission was noted to be 8.9 and dropped to 7.5 during hospitalization; which is roughly 3-4 g from her normal baseline.  Patient was transfused 1 unit PRBCs with an appropriate rise in her hemoglobin.  Gastroenterology was consulted and recommended EGD and colonoscopy.  Patient underwent EGD with noted LA grade C esophagitis status post biopsy, small hiatal hernia, nonbleeding erosive gastropathy.  Colonoscopy notable for external hemorrhoids, diverticulosis within the sigmoid/transverse colon, five 2-4 mm polyps rectum status post  resection, will and one 1 mm polyp descending colon status post resection.  During the colonoscopy there is also note of no active  bleeding with some old blood in the left colon consistent with likely resolved diverticular bleed.  GI recommended high-fiber diet as well as Protonix 40 mg p.o. twice daily x8 weeks, and Carafate 1 g 4 times daily x2 weeks.  Gastrology to follow-up biopsy results with patient.  Hemoglobin remained stable and was 8.8 at time of discharge.  ESRD on MWF HD: Nephrology was consulted on admission and patient underwent her usual dialysis on 08/20/2018. Follows with Dr. Lorrene Reid with [facility]. Continue Midodrine with HD  COPD on Chronic 3 L Supplemental O2: Currently stable without respiratory symptoms or wheezing; oxygenating well on her home oxygen regimen.  Paroxysmal atrial fibrillation: Regular rhythm on examination.  She is is no longer on anticoagulation.  Rates are currently controlled.  Diet-controlled type 2 diabetes: Last A1c 4.5 on 05/25/2017.    Hypertension: Currently stable, not requiring antihypertensives.  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   Adenomatous polyp of descending colon   Adenomatous polyp of transverse colon    Discharge Instructions  Discharge Instructions    Call MD for:   Complete by:  As directed    Blood in stool   Call MD for:  difficulty breathing, headache or visual disturbances   Complete by:  As directed    Call MD for:  persistant dizziness or light-headedness   Complete by:  As directed    Call MD for:  persistant nausea and vomiting   Complete by:  As directed    Call MD for:  severe uncontrolled pain   Complete by:  As  directed    Call MD for:  temperature >100.4   Complete by:  As directed    Diet - low sodium heart healthy   Complete by:  As directed    Increase activity slowly   Complete by:  As directed      Allergies as of 08/21/2018      Reactions   Amoxicillin Rash   Has patient had a PCN reaction causing immediate rash, facial/tongue/throat swelling, SOB or lightheadedness with hypotension:YES Has patient had a PCN reaction causing severe rash involving mucus membranes or skin necrosis: Yes Has patient had a PCN reaction that required hospitalization No Has patient had a PCN reaction occurring within the last 10 years: Yes If all of the above answers are "NO", then may proceed with Cephalosporin use.   Sulfa Drugs Cross Reactors Hives, Itching   Percocet [oxycodone-acetaminophen] Itching, Rash   Did not happen last time she took it   Sulfa Antibiotics Hives      Medication List    TAKE these medications   acetaminophen 500 MG tablet Commonly known as:  TYLENOL Take 1,000 mg by mouth as needed for mild pain or headache.   albuterol 108 (90 Base) MCG/ACT inhaler Commonly known as:  VENTOLIN HFA Inhale 2 puffs into the lungs every 6 (six) hours as needed for wheezing or shortness of breath.   alendronate 70 MG  tablet Commonly known as:  FOSAMAX Take 70 mg by mouth every Thursday.   aspirin EC 81 MG tablet Take 81 mg by mouth daily.   Auryxia 1 GM 210 MG(Fe) tablet Generic drug:  ferric citrate Take 420 mg by mouth 3 (three) times daily before meals.   budesonide-formoterol 80-4.5 MCG/ACT inhaler Commonly known as:  Symbicort Inhale 2 puffs into the lungs 2 (two) times daily.   cinacalcet 30 MG tablet Commonly known as:  SENSIPAR Take 1 tablet (30 mg total) by mouth every Monday, Wednesday, and Friday.   citalopram 40 MG tablet Commonly known as:  CELEXA Take 40 mg by mouth daily.   colchicine 0.6 MG tablet Take 1 tablet (0.6 mg total) by mouth every 3 (three) days.  Dose needs to be decreased to twice a week in dialysis patient. What changed:    when to take this  reasons to take this  additional instructions   doxercalciferol 4 MCG/2ML injection Commonly known as:  HECTOROL Inject 0.5 mLs (1 mcg total) into the vein every Monday, Wednesday, and Friday with hemodialysis.   ezetimibe 10 MG tablet Commonly known as:  ZETIA Take 10 mg by mouth daily.   gabapentin 100 MG capsule Commonly known as:  Neurontin Take 1 capsule (100 mg total) by mouth at bedtime. What changed:  how much to take   HYDROcodone-acetaminophen 5-325 MG tablet Commonly known as:  NORCO/VICODIN Take 1 tablet by mouth every 6 (six) hours as needed for moderate pain.   levothyroxine 100 MCG tablet Commonly known as:  SYNTHROID Take 100 mcg by mouth daily.   LORazepam 0.5 MG tablet Commonly known as:  ATIVAN Take 0.5 mg by mouth at bedtime.   midodrine 10 MG tablet Commonly known as:  PROAMATINE Take 1 tablet (10 mg total) by mouth See admin instructions. Take 1 tablet by mouth 3 times a day; expected on dialysis days Monday, Wednesday and Friday every 4 hours   multivitamin Tabs tablet Take 1 tablet by mouth daily.   nystatin cream Commonly known as:  MYCOSTATIN Apply 1 application topically 2 (two) times daily.   pantoprazole 40 MG tablet Commonly known as:  Protonix Take 1 tablet (40 mg total) by mouth 2 (two) times daily. What changed:  See the new instructions.   sucralfate 1 g tablet Commonly known as:  Carafate Take 1 tablet (1 g total) by mouth 4 (four) times daily for 14 days.   Vitamin D (Ergocalciferol) 1.25 MG (50000 UT) Caps capsule Commonly known as:  DRISDOL Take 50,000 Units by mouth every Monday.      Follow-up Information    Dione Housekeeper, MD. Schedule an appointment as soon as possible for a visit in 1 week(s).   Specialty:  Family Medicine Contact information: [place] [address] [phone]         Minus Breeding, MD .   Specialty:  Cardiology Contact information: [address] [place] [address] [phone]          Allergies  Allergen Reactions  . Amoxicillin Rash    Has patient had a PCN reaction causing immediate rash, facial/tongue/throat swelling, SOB or lightheadedness with hypotension:YES Has patient had a PCN reaction causing severe rash involving mucus membranes or skin necrosis: Yes Has patient had a PCN reaction that required hospitalization No Has patient had a PCN reaction occurring within the last 10 years: Yes If all of the above answers are "NO", then may proceed with Cephalosporin use.   Marland Kitchen  Sulfa Drugs Cross Reactors Hives and Itching  . Percocet [Oxycodone-Acetaminophen] Itching and Rash    Did not happen last time she took it  . Sulfa Antibiotics Hives    Consultations:  Nephrology  [facility] - Dr. Tarri Glenn   Procedures/Studies:  EGD: - LA Grade C esophagitis. Biopsied. - Small hiatal hernia. - Non-bleeding erosive gastropathy. Biopsied. - Normal examined duodenum. Biopsied. - Await pathology results. - Protonix 40 mg BID x 8 weeks. - Carafate slurry 1g QID x 2 weeks. - Keep the head of the bed upright as much as possible. - Proceed with colonoscopy today for further evaluation of the bleeding.  Colonoscopy: - Hemorrhoids found on perianal exam. - Diverticulosis in the sigmoid colon, in the descending colon and in the transverse colon. - Five 2 to 4 mm polyps in the rectum, in the sigmoid colon and in the transverse colon, removed with a cold snare. Resected and retrieved. - One 1 mm polyp in the descending colon, removed with a cold biopsy forceps. Resected and retrieved. - The examined portion of the ileum was normal. - The examination was otherwise normal on direct and retroflexion views. - No active bleeding at the time of my evaluation. Given the old blood present in the left colon, a resolved  diverticular bleed is suspected. - Advance diet as tolerated today. High fiber diet recommended. - Continue present medications. - Await pathology results. No surveillance colonoscopy recommended given her age (28 years). - Continue serial hgb/hct with transfusion as indicated.  Subjective: Patient seen and examined following EGD/colonoscopy.  No active bleeding appreciated.  Feeling well.  No complaints.  No further bleeding per rectum.  Hemoglobin stable.  Patient ready for discharge home.  Denies headache, no fever/chills/night sweats, no chest pain, no palpitations, no shortness of breath, no abdominal pain, no weakness, no issues with bowel function, no paresthesias.  No acute events overnight per nursing staff.   Discharge Exam: Vitals:   08/21/18 1225 08/21/18 1327  BP: (!) 95/58 124/74  Pulse: 72 76  Resp: 18 16  Temp:  97.9 F (36.6 C)  SpO2: 100% 96%   Vitals:   08/21/18 1210 08/21/18 1220 08/21/18 1225 08/21/18 1327  BP: (!) 86/31 (!) 85/60 (!) 95/58 124/74  Pulse:  74 72 76  Resp: 18 20 18 16   Temp: 98.4 F (36.9 C)   97.9 F (36.6 C)  TempSrc: Oral   Oral  SpO2: 100% 100% 100% 96%  Weight:      Height:        General: Pt is alert, awake, not in acute distress Cardiovascular: RRR, S1/S2 +, no rubs, no gallops Respiratory: CTA bilaterally, no wheezing, no rhonchi Abdominal: Soft, NT, ND, bowel sounds + Extremities: no edema, no cyanosis    The results of significant diagnostics from this hospitalization (including imaging, microbiology, ancillary and laboratory) are listed below for reference.     Microbiology: Recent Results (from the past 240 hour(s))  SARS Coronavirus 2 (CEPHEID - Performed in [facility] hospital lab), Hosp Order     Status: None   Collection Time: 08/19/18 11:30 PM  Result Value Ref Range Status   SARS Coronavirus 2 NEGATIVE NEGATIVE Final    Comment: (NOTE) If result is NEGATIVE SARS-CoV-2 target nucleic acids are NOT  DETECTED. The SARS-CoV-2 RNA is generally detectable in upper and lower  respiratory specimens during the acute phase of infection. The lowest  concentration of SARS-CoV-2 viral copies this assay can detect is 250  copies /  mL. A negative result does not preclude SARS-CoV-2 infection  and should not be used as the sole basis for treatment or other  patient management decisions.  A negative result may occur with  improper specimen collection / handling, submission of specimen other  than nasopharyngeal swab, presence of viral mutation(s) within the  areas targeted by this assay, and inadequate number of viral copies  (<250 copies / mL). A negative result must be combined with clinical  observations, patient history, and epidemiological information. If result is POSITIVE SARS-CoV-2 target nucleic acids are DETECTED. The SARS-CoV-2 RNA is generally detectable in upper and lower  respiratory specimens dur ing the acute phase of infection.  Positive  results are indicative of active infection with SARS-CoV-2.  Clinical  correlation with patient history and other diagnostic information is  necessary to determine patient infection status.  Positive results do  not rule out bacterial infection or co-infection with other viruses. If result is PRESUMPTIVE POSTIVE SARS-CoV-2 nucleic acids MAY BE PRESENT.   A presumptive positive result was obtained on the submitted specimen  and confirmed on repeat testing.  While 2019 novel coronavirus  (SARS-CoV-2) nucleic acids may be present in the submitted sample  additional confirmatory testing may be necessary for epidemiological  and / or clinical management purposes  to differentiate between  SARS-CoV-2 and other Sarbecovirus currently known to infect humans.  If clinically indicated additional testing with an alternate test  methodology [phone]) is advised. The SARS-CoV-2 RNA is generally  detectable in upper and lower respiratory sp ecimens during  the acute  phase of infection. The expected result is Negative. Fact Sheet for Patients:  StrictlyIdeas.no Fact Sheet for Healthcare Providers: BankingDealers.co.za This test is not yet approved or cleared by the [place] FDA and has been authorized for detection and/or diagnosis of SARS-CoV-2 by FDA under an Emergency Use Authorization (EUA).  This EUA will remain in effect (meaning this test can be used) for the duration of the COVID-19 declaration under Section 564(b)(1) of the Act, 21 U.S.C. section 360bbb-3(b)(1), unless the authorization is terminated or revoked sooner. Performed at [facility], [place] [address] [place], [place] [address]   MRSA PCR Screening     Status: Abnormal   Collection Time: 08/20/18  1:38 AM  Result Value Ref Range Status   MRSA by PCR POSITIVE (A) NEGATIVE Final    Comment:        The GeneXpert MRSA Assay (FDA approved for NASAL specimens only), is one component of a comprehensive MRSA colonization surveillance program. It is not intended to diagnose MRSA infection nor to guide or monitor treatment for MRSA infections. RESULT CALLED TO, READ BACK BY AND VERIFIED WITH: MINTZ,A RN 08/20/2018 AT 0303 SKEEN,P Performed at [facility], [place] [address], [place], [place] [address]      Labs: BNP (last 3 results) No results for input(s): BNP in the last 8760 hours. Basic Metabolic Panel: Recent Labs  Lab 08/19/18 2033 08/20/18 0347 08/21/18 0558  NA 138 141 139  K 4.2 4.9 3.8  CL 102 105 99  CO2 23 25 26   GLUCOSE 102* 81 91  BUN 37* 39* 11  CREATININE 7.71* 8.15* 4.34*  CALCIUM 8.0* 8.1* 8.2*   Liver Function Tests: Recent Labs  Lab 08/19/18 2033  AST 25  ALT 19  ALKPHOS 112  BILITOT 0.6  PROT 6.1*  ALBUMIN 3.6   No results for input(s): LIPASE, AMYLASE in the last 168 hours. No results for  input(s): AMMONIA in the last 168 hours. CBC: Recent Labs  Lab  08/19/18 2033 08/20/18 0347 08/20/18 1029 08/21/18 0558 08/21/18 1516  WBC 7.1 7.1 5.8 8.9 6.6  NEUTROABS 4.7  --   --   --   --   HGB 8.9* 8.5* 7.5* 9.3* 8.8*  HCT 28.6* 27.0* 24.3* 29.5* 27.4*  MCV 97.6 96.8 97.2 96.1 95.1  PLT 214 207 187 208 203   Cardiac Enzymes: No results for input(s): CKTOTAL, CKMB, CKMBINDEX, TROPONINI in the last 168 hours. BNP: Invalid input(s): POCBNP CBG: Recent Labs  Lab 08/20/18 0218  GLUCAP 72   D-Dimer No results for input(s): DDIMER in the last 72 hours. Hgb A1c No results for input(s): HGBA1C in the last 72 hours. Lipid Profile No results for input(s): CHOL, HDL, LDLCALC, TRIG, CHOLHDL, LDLDIRECT in the last 72 hours. Thyroid function studies No results for input(s): TSH, T4TOTAL, T3FREE, THYROIDAB in the last 72 hours.  Invalid input(s): FREET3 Anemia work up No results for input(s): VITAMINB12, FOLATE, FERRITIN, TIBC, IRON, RETICCTPCT in the last 72 hours. Urinalysis    Component Value Date/Time   COLORURINE YELLOW 07/26/2013 1202   APPEARANCEUR CLEAR 07/26/2013 1202   LABSPEC 1.016 07/26/2013 1202   PHURINE 7.0 07/26/2013 1202   GLUCOSEU NEGATIVE 07/26/2013 1202   HGBUR SMALL (A) 07/26/2013 1202   BILIRUBINUR NEGATIVE 07/26/2013 1202   KETONESUR NEGATIVE 07/26/2013 1202   PROTEINUR 100 (A) 07/26/2013 1202   UROBILINOGEN 1.0 07/26/2013 1202   NITRITE NEGATIVE 07/26/2013 1202   LEUKOCYTESUR SMALL (A) 07/26/2013 1202   Sepsis Labs Invalid input(s): PROCALCITONIN,  WBC,  LACTICIDVEN Microbiology Recent Results (from the past 240 hour(s))  SARS Coronavirus 2 (CEPHEID - Performed in [facility] hospital lab), Hosp Order     Status: None   Collection Time: 08/19/18 11:30 PM  Result Value Ref Range Status   SARS Coronavirus 2 NEGATIVE NEGATIVE Final    Comment: (NOTE) If result is NEGATIVE SARS-CoV-2 target nucleic acids are NOT DETECTED. The SARS-CoV-2 RNA is generally detectable in upper and lower  respiratory specimens  during the acute phase of infection. The lowest  concentration of SARS-CoV-2 viral copies this assay can detect is 250  copies / mL. A negative result does not preclude SARS-CoV-2 infection  and should not be used as the sole basis for treatment or other  patient management decisions.  A negative result may occur with  improper specimen collection / handling, submission of specimen other  than nasopharyngeal swab, presence of viral mutation(s) within the  areas targeted by this assay, and inadequate number of viral copies  (<250 copies / mL). A negative result must be combined with clinical  observations, patient history, and epidemiological information. If result is POSITIVE SARS-CoV-2 target nucleic acids are DETECTED. The SARS-CoV-2 RNA is generally detectable in upper and lower  respiratory specimens dur ing the acute phase of infection.  Positive  results are indicative of active infection with SARS-CoV-2.  Clinical  correlation with patient history and other diagnostic information is  necessary to determine patient infection status.  Positive results do  not rule out bacterial infection or co-infection with other viruses. If result is PRESUMPTIVE POSTIVE SARS-CoV-2 nucleic acids MAY BE PRESENT.   A presumptive positive result was obtained on the submitted specimen  and confirmed on repeat testing.  While 2019 novel coronavirus  (SARS-CoV-2) nucleic acids may be present in the submitted sample  additional confirmatory testing may be necessary for epidemiological  and / or clinical management  purposes  to differentiate between  SARS-CoV-2 and other Sarbecovirus currently known to infect humans.  If clinically indicated additional testing with an alternate test  methodology [phone]) is advised. The SARS-CoV-2 RNA is generally  detectable in upper and lower respiratory sp ecimens during the acute  phase of infection. The expected result is Negative. Fact Sheet for Patients:   StrictlyIdeas.no Fact Sheet for Healthcare Providers: BankingDealers.co.za This test is not yet approved or cleared by the [place] FDA and has been authorized for detection and/or diagnosis of SARS-CoV-2 by FDA under an Emergency Use Authorization (EUA).  This EUA will remain in effect (meaning this test can be used) for the duration of the COVID-19 declaration under Section 564(b)(1) of the Act, 21 U.S.C. section 360bbb-3(b)(1), unless the authorization is terminated or revoked sooner. Performed at [facility], [place] [address], [place], [place] [address]   MRSA PCR Screening     Status: Abnormal   Collection Time: 08/20/18  1:38 AM  Result Value Ref Range Status   MRSA by PCR POSITIVE (A) NEGATIVE Final    Comment:        The GeneXpert MRSA Assay (FDA approved for NASAL specimens only), is one component of a comprehensive MRSA colonization surveillance program. It is not intended to diagnose MRSA infection nor to guide or monitor treatment for MRSA infections. RESULT CALLED TO, READ BACK BY AND VERIFIED WITH: MINTZ,A RN 08/20/2018 AT 0303 SKEEN,P Performed at [facility], [place] [address], [place], [place] [address]      Time coordinating discharge: Over 30 minutes  SIGNED:    J [place] DO  [facility] 08/21/2018, 4:13 PM

## 2018-08-21 NOTE — Progress Notes (Signed)
[name] to be discharged home per MD order. Discussed prescriptions and follow up appointments with the patient. Prescriptions given to patient; medication list explained in detail. Patient verbalized understanding.  Skin clean, dry and intact without evidence of skin break down, no evidence of skin tears noted. IV catheter discontinued intact. Site without signs and symptoms of complications. Dressing and pressure applied. Pt denies pain at the site currently. No complaints noted.  Patient free of lines, drains, and wounds.   An After Visit Summary (AVS) was printed and given to the patient. Patient escorted via wheelchair, and discharged home via private auto.  Baldo Ash, RN

## 2018-08-21 NOTE — Op Note (Signed)
[facility] Patient Name: [name] Procedure Date : 08/21/2018 MRN: [id number] Attending MD: Thornton Park MD, MD Date of Birth: October 31, 1940 CSN: [id number] Age: 78 Admit Type: Inpatient Procedure:                Colonoscopy Indications:              Hematochezia Providers:                Thornton Park MD, MD, Cleda Daub, RN, Marguerita Merles, Technician, Charolette Child, Technician,                            Garrison Columbus, CRNA Referring MD:              Medicines:                See the Anesthesia note for documentation of the                            administered medications Complications:            No immediate complications. Estimated blood loss:                            Minimal. Estimated Blood Loss:     Estimated blood loss was minimal. Procedure:                Pre-Anesthesia Assessment:                           - Prior to the procedure, a History and Physical                            was performed, and patient medications and                            allergies were reviewed. The patient's tolerance of                            previous anesthesia was also reviewed. The risks                            and benefits of the procedure and the sedation                            options and risks were discussed with the patient.                            All questions were answered, and informed consent                            was obtained. Prior Anticoagulants: The patient has                            taken no previous anticoagulant or antiplatelet  agents. ASA Grade Assessment: III - A patient with                            severe systemic disease. After reviewing the risks                            and benefits, the patient was deemed in                            satisfactory condition to undergo the procedure.                           - Prior to the procedure, a History and Physical                            was performed, and patient medications and                            allergies were reviewed. The patient's tolerance of                            previous anesthesia was also reviewed. The risks                            and benefits of the procedure and the sedation                            options and risks were discussed with the patient.                            All questions were answered, and informed consent                            was obtained. Prior Anticoagulants: The patient has                            taken no previous anticoagulant or antiplatelet                            agents. ASA Grade Assessment: III - A patient with                            severe systemic disease. After reviewing the risks                            and benefits, the patient was deemed in                            satisfactory condition to undergo the procedure.                           After obtaining informed consent, the colonoscope  was passed under direct vision. Throughout the                            procedure, the patient's blood pressure, pulse, and                            oxygen saturations were monitored continuously. The                            CF-HQ190L ([id number] Olympus colonoscope was                            introduced through the anus and advanced to the the                            terminal ileum, with identification of the                            appendiceal orifice and IC valve. The colonoscopy                            was performed without difficulty. The patient                            tolerated the procedure well. The quality of the                            bowel preparation was good. The terminal ileum,                            ileocecal valve, appendiceal orifice, and rectum                            were photographed. Scope In: 11:35:17 AM Scope Out: 11:59:30 PM Scope Withdrawal  Time: 0 hours 9 minutes 5 seconds  Total Procedure Duration: 12 hours 24 minutes 13 seconds  Findings:      Hemorrhoids were found on perianal exam. Internal hemorhoids were also       identified.      Multiple small and large-mouthed diverticula were found in the sigmoid       colon, descending colon and transverse colon. There was a small amount       of old blood in the left colon. No active bleeding site identified.      Five sessile polyps were found in the rectum, sigmoid colon and       transverse colon. The polyps were 2 to 4 mm in size. These polyps were       removed with a cold snare. Resection and retrieval were complete.       Estimated blood loss was minimal.      A 1 mm polyp was found in the descending colon. The polyp was sessile.       The polyp was removed with a cold biopsy forceps. Resection and       retrieval were complete.      The terminal ileum appeared normal. No blood was present.  The exam was otherwise without abnormality on direct and retroflexion       views. Impression:               - Hemorrhoids found on perianal exam.                           - Diverticulosis in the sigmoid colon, in the                            descending colon and in the transverse colon.                           - Five 2 to 4 mm polyps in the rectum, in the                            sigmoid colon and in the transverse colon, removed                            with a cold snare. Resected and retrieved.                           - One 1 mm polyp in the descending colon, removed                            with a cold biopsy forceps. Resected and retrieved.                           - The examined portion of the ileum was normal.                           - The examination was otherwise normal on direct                            and retroflexion views.                           - No active bleeding at the time of my evaluation.                            Given the old blood  present in the left colon, a                            resolved diverticular bleed is suspected. Recommendation:           - Advance diet as tolerated today. High fiber diet                            recommended.                           - Continue present medications.                           - Await pathology results. No surveillance  colonoscopy recommended given her age (25 years).                           - Continue serial hgb/hct with transfusion as                            indicated. Procedure Code(s):        --- Professional ---                           [phone], Colonoscopy, flexible; with removal of                            tumor(s), polyp(s), or other lesion(s) by snare                            technique                           45380, 80, Colonoscopy, flexible; with biopsy,                            single or multiple Diagnosis Code(s):        --- Professional ---                           K64.9, Unspecified hemorrhoids                           K62.1, Rectal polyp                           K63.5, Polyp of colon                           K92.1, Melena (includes Hematochezia)                           K57.30, Diverticulosis of large intestine without                            perforation or abscess without bleeding CPT copyright 2019 [employer]. All rights reserved. The codes documented in this report are preliminary and upon coder review may  be revised to meet current compliance requirements. Thornton Park MD, MD 08/21/2018 12:23:31 PM This report has been signed electronically. Number of Addenda: 0

## 2018-08-21 NOTE — Anesthesia Procedure Notes (Signed)
Procedure Name: MAC Date/Time: 08/21/2018 11:15 AM Performed by: Harden Mo, CRNA Pre-anesthesia Checklist: Patient identified, Emergency Drugs available, Suction available and Patient being monitored Patient Re-evaluated:Patient Re-evaluated prior to induction Oxygen Delivery Method: Nasal cannula Preoxygenation: Pre-oxygenation with 100% oxygen Induction Type: IV induction Placement Confirmation: positive ETCO2 and breath sounds checked- equal and bilateral Dental Injury: Teeth and Oropharynx as per pre-operative assessment

## 2018-08-21 NOTE — Transfer of Care (Signed)
Immediate Anesthesia Transfer of Care Note  Patient: YURANI FETTES  Procedure(s) Performed: COLONOSCOPY WITH PROPOFOL (N/A ) ESOPHAGOGASTRODUODENOSCOPY (EGD) WITH PROPOFOL (N/A ) BIOPSY POLYPECTOMY  Patient Location: Endoscopy Unit  Anesthesia Type:MAC  Level of Consciousness: awake and alert   Airway & Oxygen Therapy: Patient Spontanous Breathing and Patient connected to nasal cannula oxygen  Post-op Assessment: Report given to RN, Post -op Vital signs reviewed and stable and Patient moving all extremities X 4  Post vital signs: Reviewed and stable  Last Vitals:  Vitals Value Taken Time  BP    Temp    Pulse    Resp    SpO2      Last Pain:  Vitals:   08/21/18 1020  TempSrc: Oral  PainSc: 0-No pain         Complications: No apparent anesthesia complications

## 2018-08-21 NOTE — Anesthesia Postprocedure Evaluation (Signed)
Anesthesia Post Note  Patient: [name]  Procedure(s) Performed: COLONOSCOPY WITH PROPOFOL (N/A ) ESOPHAGOGASTRODUODENOSCOPY (EGD) WITH PROPOFOL (N/A ) BIOPSY POLYPECTOMY     Patient location during evaluation: PACU Anesthesia Type: MAC Level of consciousness: awake and alert Pain management: pain level controlled Vital Signs Assessment: post-procedure vital signs reviewed and stable Respiratory status: spontaneous breathing, nonlabored ventilation, respiratory function stable and patient connected to nasal cannula oxygen Cardiovascular status: stable and blood pressure returned to baseline Postop Assessment: no apparent nausea or vomiting Anesthetic complications: no    Last Vitals:  Vitals:   08/21/18 1225 08/21/18 1327  BP: (!) 95/58 124/74  Pulse: 72 76  Resp: 18   Temp:  36.6 C  SpO2: 100% 96%    Last Pain:  Vitals:   08/21/18 1327  TempSrc: Oral  PainSc:                  [name]

## 2018-08-22 ENCOUNTER — Encounter (HOSPITAL_COMMUNITY): Payer: Self-pay | Admitting: Gastroenterology

## 2018-08-22 LAB — HEPATITIS B SURFACE ANTIGEN: Hepatitis B Surface Ag: NEGATIVE

## 2018-08-23 ENCOUNTER — Encounter: Payer: Self-pay | Admitting: Gastroenterology

## 2018-08-28 ENCOUNTER — Telehealth: Payer: Self-pay | Admitting: Gastroenterology

## 2018-08-28 NOTE — Telephone Encounter (Signed)
Letter read over the phone to the daughter in law. Nothing further needed at the time of the phone call.

## 2018-08-28 NOTE — Telephone Encounter (Signed)
Pt's [facility] called inquiring about path results. She states that pt is getting anxious about it.

## 2018-09-06 ENCOUNTER — Encounter (HOSPITAL_COMMUNITY): Payer: Self-pay

## 2018-09-06 ENCOUNTER — Emergency Department (HOSPITAL_COMMUNITY): Payer: Medicare Other

## 2018-09-06 ENCOUNTER — Other Ambulatory Visit: Payer: Self-pay

## 2018-09-06 ENCOUNTER — Emergency Department (HOSPITAL_COMMUNITY)
Admission: EM | Admit: 2018-09-06 | Discharge: 2018-09-06 | Disposition: A | Discharge status: Home / Self Care | Payer: Medicare Other | Attending: Emergency Medicine | Admitting: Emergency Medicine

## 2018-09-06 DIAGNOSIS — R531 Weakness: Secondary | ICD-10-CM | POA: Diagnosis present

## 2018-09-06 DIAGNOSIS — E039 Hypothyroidism, unspecified: Secondary | ICD-10-CM | POA: Diagnosis not present

## 2018-09-06 DIAGNOSIS — Z79899 Other long term (current) drug therapy: Secondary | ICD-10-CM | POA: Diagnosis not present

## 2018-09-06 DIAGNOSIS — N186 End stage renal disease: Secondary | ICD-10-CM | POA: Diagnosis not present

## 2018-09-06 DIAGNOSIS — Z7982 Long term (current) use of aspirin: Secondary | ICD-10-CM | POA: Diagnosis not present

## 2018-09-06 DIAGNOSIS — I4891 Unspecified atrial fibrillation: Secondary | ICD-10-CM | POA: Diagnosis not present

## 2018-09-06 DIAGNOSIS — J449 Chronic obstructive pulmonary disease, unspecified: Secondary | ICD-10-CM | POA: Diagnosis not present

## 2018-09-06 DIAGNOSIS — E119 Type 2 diabetes mellitus without complications: Secondary | ICD-10-CM | POA: Insufficient documentation

## 2018-09-06 DIAGNOSIS — I120 Hypertensive chronic kidney disease with stage 5 chronic kidney disease or end stage renal disease: Secondary | ICD-10-CM | POA: Diagnosis not present

## 2018-09-06 DIAGNOSIS — Z992 Dependence on renal dialysis: Secondary | ICD-10-CM | POA: Insufficient documentation

## 2018-09-06 DIAGNOSIS — E860 Dehydration: Secondary | ICD-10-CM | POA: Insufficient documentation

## 2018-09-06 DIAGNOSIS — R197 Diarrhea, unspecified: Secondary | ICD-10-CM | POA: Insufficient documentation

## 2018-09-06 DIAGNOSIS — Z87891 Personal history of nicotine dependence: Secondary | ICD-10-CM | POA: Diagnosis not present

## 2018-09-06 LAB — CBC WITH DIFFERENTIAL/PLATELET
Abs Immature Granulocytes: 0.07 10*3/uL (ref 0.00–0.07)
Basophils Absolute: 0.1 10*3/uL (ref 0.0–0.1)
Basophils Relative: 1 %
Eosinophils Absolute: 0.6 10*3/uL — ABNORMAL HIGH (ref 0.0–0.5)
Eosinophils Relative: 8 %
HCT: 37.1 % (ref 36.0–46.0)
Hemoglobin: 11.3 g/dL — ABNORMAL LOW (ref 12.0–15.0)
Immature Granulocytes: 1 %
Lymphocytes Relative: 13 %
Lymphs Abs: 1.1 10*3/uL (ref 0.7–4.0)
MCH: 31.2 pg (ref 26.0–34.0)
MCHC: 30.5 g/dL (ref 30.0–36.0)
MCV: 102.5 fL — ABNORMAL HIGH (ref 80.0–100.0)
Monocytes Absolute: 0.9 10*3/uL (ref 0.1–1.0)
Monocytes Relative: 11 %
Neutro Abs: 5.8 10*3/uL (ref 1.7–7.7)
Neutrophils Relative %: 66 %
Platelets: 148 10*3/uL — ABNORMAL LOW (ref 150–400)
RBC: 3.62 MIL/uL — ABNORMAL LOW (ref 3.87–5.11)
RDW: 17.1 % — ABNORMAL HIGH (ref 11.5–15.5)
WBC: 8.6 10*3/uL (ref 4.0–10.5)
nRBC: 0 % (ref 0.0–0.2)

## 2018-09-06 LAB — COMPREHENSIVE METABOLIC PANEL
ALT: 15 U/L (ref 0–44)
AST: 21 U/L (ref 15–41)
Albumin: 3.5 g/dL (ref 3.5–5.0)
Alkaline Phosphatase: 140 U/L — ABNORMAL HIGH (ref 38–126)
Anion gap: 9 (ref 5–15)
BUN: 23 mg/dL (ref 8–23)
CO2: 21 mmol/L — ABNORMAL LOW (ref 22–32)
Calcium: 8.9 mg/dL (ref 8.9–10.3)
Chloride: 103 mmol/L (ref 98–111)
Creatinine, Ser: 6.24 mg/dL — ABNORMAL HIGH (ref 0.44–1.00)
GFR calc Af Amer: 7 mL/min — ABNORMAL LOW (ref >60)
GFR calc non Af Amer: 6 mL/min — ABNORMAL LOW (ref >60)
Glucose, Bld: 92 mg/dL (ref 70–99)
Potassium: 3.9 mmol/L (ref 3.5–5.1)
Sodium: 133 mmol/L — ABNORMAL LOW (ref 135–145)
Total Bilirubin: 0.6 mg/dL (ref 0.3–1.2)
Total Protein: 6 g/dL — ABNORMAL LOW (ref 6.5–8.1)

## 2018-09-06 LAB — LIPASE, BLOOD: Lipase: 26 U/L (ref 11–51)

## 2018-09-06 LAB — TROPONIN I: Troponin I: <0.03 ng/mL (ref <0.03)

## 2018-09-06 MED ORDER — SODIUM CHLORIDE 0.9 % IV BOLUS
500.0000 mL | Freq: Once | INTRAVENOUS | Status: AC
  Administered 2018-09-06: 500 mL via INTRAVENOUS

## 2018-09-06 NOTE — ED Triage Notes (Signed)
Pt to ED via [facility] for weakness for more than 1 week.  Pt has dialysis and was last dialyzed yesterday. During dialysis pt was given her prescribed midodrine for hypotension.  Today pt's family called 911 due to increased weakness and hypotension.  Pt had nausea and vomiting yesterday in HD an isolated episode and c/o diarrhea since Monday.

## 2018-09-06 NOTE — Discharge Instructions (Addendum)
Please schedule a follow-up appointment with your primary care doctor.  If your symptoms get worse or you have additional concerns please seek additional medical care and evaluation.  Please follow the instructions for Imodium as you can take a significantly higher amount than what you have been taking.  Please stop taking Imodium if you notice blood in your bowel movements.

## 2018-09-06 NOTE — ED Provider Notes (Signed)
[facility] EMERGENCY DEPARTMENT Provider Note   CSN: [id number] Arrival date & time: 09/06/18  1459    History   Chief Complaint Chief Complaint  Patient presents with  . Weakness    HPI [name] is a 78 y.o. female with a past medical history of ESRD on HD Monday Wednesday Friday, COPD, DM, hypertension, A. fib, who presents today for evaluation of generally feeling weak.  She reports that she has felt unwell and weak since Monday.  She reports that she has been having diarrhea.  She had 2 episodes of diarrhea today.  She denies any blood in her diarrhea.  She denies any abdominal pain.  She did get nauseous and vomit yesterday during her dialysis.  She frequently gets hypotensive during dialysis and therefore takes Midodrine on dialysis days.  She does not normally have vomiting during dialysis.  She denies any blood in her vomit.     HPI  Past Medical History:  Diagnosis Date  . A-fib ([place])   . Acute respiratory failure ([place]) 05/2017  . Anemia   . Anxiety   . Arthritis   . COPD (chronic obstructive pulmonary disease) ([place])   . Depression   . Diabetes mellitus    "diet controlled"  . Diabetes mellitus without complication ([place])   . ESRD (end stage renal disease) ([place])    dialysis MWF  . GERD (gastroesophageal reflux disease)   . Gout   . History of blood transfusion   . HLD (hyperlipidemia)   . HOH (hard of hearing)    left ear  . Hypertension    hypotensive -since starting dialysis  . Hypothyroidism   . PAF (paroxysmal atrial fibrillation) ([place])    a. Echo 11/16:  Mild LVH, EF 55-60%, normal wall motion, MAC, mild MR, severe LAE (49 ml/m2), mild RVE, normal RVSF, mild RAE, mild TR, PASP 24 mmHg;  CHADS2-VASc: 4 >> Coumadin followed by PCP  . Pneumonia   . Renal disorder   . Wears dentures   . Wears glasses     Patient Active Problem List   Diagnosis Date Noted  . ESRD on hemodialysis ([place])   . 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 Adenomatous polyp of descending colon   . Adenomatous polyp of transverse colon   . Depression 08/19/2018  . Cellulitis of right arm 05/14/2018  . Disorder of tendon of posterior tibial muscle 04/11/2018  . Pain of joint of left ankle and foot 02/27/2018  . Pain in joint of right ankle 02/27/2018  . Pernicious anemia 12/12/2017  . Carpal tunnel syndrome of left wrist 09/26/2017  . Arterial hypotension 08/14/2017  . Leukocytosis 06/05/2017  . MRSA (methicillin resistant staph aureus) culture positive 06/01/2017  . Community acquired pneumonia of right lung   . Morbid obesity due to excess calories ([place]) 05/03/2016  . Acute respiratory failure with hypoxia ([place]) 04/14/2016  . Fever 04/12/2016  . Sepsis ([place]) 04/12/2016  . Diabetes mellitus with complication ([place])   . Gastroesophageal reflux disease without esophagitis   . Dyslipidemia 02/09/2016  . 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 Cough variant asthma vs UACS  09/01/2015  . Acute bronchitis 06/16/2015  . Anemia due to vitamin B12 deficiency 04/21/2015  . Anxiety, generalized 04/21/2015  . Atrial fibrillation ([place]) 02/16/2015  . Chronic anticoagulation 02/16/2015  . Anemia   . PAF (paroxysmal atrial fibrillation) ([place]) 02/04/2015  . Diabetes mellitus type 2, diet-controlled ([place])   . Hypothyroidism   .  High cholesterol   . Steal syndrome dialysis vascular access ([place]) 12/03/2014  . Physical deconditioning 08/04/2013  . Hyperkalemia 07/26/2013  . Chronic kidney disease due to type 2 diabetes mellitus ([place]) 07/26/2013  . Hematoma and contusion 07/18/2013  . ESRD (end stage renal disease) on dialysis ([place]) 03/15/2012    Past Surgical History:  Procedure Laterality Date  . AV FISTULA PLACEMENT  04/03/2012   Procedure: ARTERIOVENOUS (AV) FISTULA CREATION;  Surgeon: Elam Dutch, MD;  Location: [facility];  Service: Vascular;  Laterality: Left;  creation left brachial cephalic fistula   . BIOPSY  08/21/2018    Procedure: BIOPSY;  Surgeon: Thornton Park, MD;  Location: [facility];  Service: Gastroenterology;;  . CARPAL TUNNEL RELEASE Left   . COLONOSCOPY W/ POLYPECTOMY    . COLONOSCOPY WITH PROPOFOL N/A 08/21/2018   Procedure: COLONOSCOPY WITH PROPOFOL;  Surgeon: Thornton Park, MD;  Location: [facility];  Service: Gastroenterology;  Laterality: N/A;  . DILATION AND CURETTAGE OF UTERUS    . ESOPHAGOGASTRODUODENOSCOPY (EGD) WITH PROPOFOL N/A 08/21/2018   Procedure: ESOPHAGOGASTRODUODENOSCOPY (EGD) WITH PROPOFOL;  Surgeon: Thornton Park, MD;  Location: [facility];  Service: Gastroenterology;  Laterality: N/A;  . EYE SURGERY Bilateral    bilateral cataract removal  . HEMATOMA EVACUATION Left 05/14/2018   Procedure: Incision and Drainage of Left Arm Hematoma;  Surgeon: Elam Dutch, MD;  Location[facility];  Service: Vascular;  Laterality: Left;  . Hemodialysis  catheter Right   . IR FLUORO GUIDE CV LINE RIGHT  05/26/2017  . IR REMOVAL TUN CV CATH W/O FL  05/24/2017  . IR US GUIDE VASC ACCESS RIGHT  05/26/2017  . JOINT REPLACEMENT Bilateral    bilateral knee  . JOINT REPLACEMENT Right    shoulder  . LIGATION OF ARTERIOVENOUS  FISTULA Left 02/04/2015   Procedure: LIGATION OF BRACHIOCEPHALIC ARTERIOVENOUS  FISTULA;  Surgeon: Conrad [place], MD;  Location: [facility];  Service: Vascular;  Laterality: Left;  Marland Kitchen MULTIPLE TOOTH EXTRACTIONS    . POLYPECTOMY  08/21/2018   Procedure: POLYPECTOMY;  Surgeon: Thornton Park, MD;  Location: [facility];  Service: Gastroenterology;;  . PORTACATH PLACEMENT    . REVERSE SHOULDER ARTHROPLASTY Left 01/22/2016   Procedure: LEFT REVERSE SHOULDER ARTHROPLASTY;  Surgeon: Netta Cedars, MD;  Location: [facility];  Service: Orthopedics;  Laterality: Left;  . SPLIT NIGHT STUDY  07/26/2015  . STERIOD INJECTION Right 01/22/2016   Procedure: RIGHT RING FINGER STEROID INJECTION;  Surgeon: Netta Cedars, MD;  Location: [facility];  Service: Orthopedics;  Laterality: Right;  . TEE  WITHOUT CARDIOVERSION N/A 05/26/2017   Procedure: TRANSESOPHAGEAL ECHOCARDIOGRAM (TEE);  Surgeon: Lelon Perla, MD;  Location: [facility];  Service: Cardiovascular;  Laterality: N/A;  . THROMBECTOMY BRACHIAL ARTERY Left 02/06/2015   Procedure: EVACUATION OF LEFT ARM HEMATOMA;  Surgeon: Angelia Mould, MD;  Location: [facility];  Service: Vascular;  Laterality: Left;  . TOTAL KNEE ARTHROPLASTY     right knee  . TUBAL LIGATION       OB History    Gravida  0   Para  0   Term  0   Preterm  0   AB  0   Living        SAB  0   TAB  0   Ectopic  0   Multiple      Live Births               Home Medications    Prior to Admission medications  Medication Sig Start Date End Date Taking? Authorizing Provider  acetaminophen (TYLENOL) 500 MG tablet Take 1,000 mg by mouth as needed for mild pain or headache.    [provider]  albuterol (PROVENTIL HFA;VENTOLIN HFA) 108 (90 Base) MCG/ACT inhaler Inhale 2 puffs into the lungs every 6 (six) hours as needed for wheezing or shortness of breath.    [provider]  alendronate (FOSAMAX) 70 MG tablet Take 70 mg by mouth every Thursday.  08/07/17   [provider]  aspirin EC 81 MG tablet Take 81 mg by mouth daily.    [provider]  budesonide-formoterol (SYMBICORT) 80-4.5 MCG/ACT inhaler Inhale 2 puffs into the lungs 2 (two) times daily. 01/09/17   Tanda Rockers, MD  cinacalcet (SENSIPAR) 30 MG tablet Take 1 tablet (30 mg total) by mouth every Monday, Wednesday, and Friday. 05/28/17   Allie Bossier, MD  citalopram (CELEXA) 40 MG tablet Take 40 mg by mouth daily.  05/29/17   [provider]  colchicine 0.6 MG tablet Take 1 tablet (0.6 mg total) by mouth every 3 (three) days. Dose needs to be decreased to twice a week in dialysis patient. Patient taking differently: Take 0.6 mg by mouth daily as needed (gout).  04/14/16   Debbe Odea, MD  doxercalciferol (HECTOROL) 4 MCG/2ML injection  Inject 0.5 mLs (1 mcg total) into the vein every Monday, Wednesday, and Friday with hemodialysis. 05/29/17   Allie Bossier, MD  ezetimibe (ZETIA) 10 MG tablet Take 10 mg by mouth daily.     [provider]  Ferric Citrate (AURYXIA) 1 GM 210 MG(Fe) TABS Take 420 mg by mouth 3 (three) times daily before meals.     [provider]  gabapentin (NEURONTIN) 100 MG capsule Take 1 capsule (100 mg total) by mouth at bedtime. Patient taking differently: Take 200 mg by mouth at bedtime.  05/28/17   Allie Bossier, MD  HYDROcodone-acetaminophen (NORCO/VICODIN) 5-325 MG tablet Take 1 tablet by mouth every 6 (six) hours as needed for moderate pain. 04/28/18   Fredia Sorrow, MD  levothyroxine (SYNTHROID, LEVOTHROID) 100 MCG tablet Take 100 mcg by mouth daily.      [provider]  LORazepam (ATIVAN) 0.5 MG tablet Take 0.5 mg by mouth at bedtime.  09/01/16   [provider]  midodrine (PROAMATINE) 10 MG tablet Take 1 tablet (10 mg total) by mouth See admin instructions. Take 1 tablet by mouth 3 times a day; expected on dialysis days Monday, Wednesday and Friday every 4 hours 05/22/18   Lendon Colonel, NP  multivitamin (RENA-VIT) TABS tablet Take 1 tablet by mouth daily.    [provider]  nystatin cream (MYCOSTATIN) Apply 1 application topically 2 (two) times daily.  03/16/18   [provider]  pantoprazole (PROTONIX) 40 MG tablet Take 1 tablet (40 mg total) by mouth 2 (two) times daily. 08/21/18 10/20/18  [place] Donnamarie Poag, DO  sucralfate (CARAFATE) 1 g tablet Take 1 tablet (1 g total) by mouth 4 (four) times daily for 14 days. 08/21/18 09/04/18  [place] Eric J, DO  Vitamin D, Ergocalciferol, (DRISDOL) 50000 UNITS CAPS Take 50,000 Units by mouth every Monday.     [provider]    Family History Family History  Problem Relation Age of Onset  . Arthritis Mother   . Diabetes Father        before age 71  . Heart disease Father   . Diabetes Sister    . Cancer  Brother   . Hyperlipidemia Daughter   . Hypertension Daughter   . Hypertension Son   . Dementia Sister   . Cancer Sister   . Other Sister   . Stroke Brother     Social History Social History   Tobacco Use  . Smoking status: Former Smoker    Packs/day: 0.25    Years: 2.00    Pack years: 0.50    Types: Cigarettes    Last attempt to quit: 04/04/1998    Years since quitting: 20.4  . Smokeless tobacco: Never Used  Substance Use Topics  . Alcohol use: No    Alcohol/week: 0.0 standard drinks  . Drug use: No     Allergies   Amoxicillin; Sulfa drugs cross reactors; Percocet [oxycodone-acetaminophen]; and Sulfa antibiotics   Review of Systems Review of Systems  Constitutional: Negative for chills and fever.  HENT: Negative for congestion.   Eyes: Negative for visual disturbance.  Respiratory: Negative for cough and shortness of breath.   Cardiovascular: Negative for chest pain.  Gastrointestinal: Positive for diarrhea. Negative for abdominal pain, blood in stool, nausea and vomiting.  Musculoskeletal: Negative for back pain and neck pain.  Skin: Negative for color change and rash.  Neurological: Positive for weakness (Generalized).  Psychiatric/Behavioral: Negative for confusion.  All other systems reviewed and are negative.    Physical Exam Updated Vital Signs BP 136/76 (BP Location: Right Arm)   Pulse 69   Temp 98 F (36.7 C) (Oral)   Resp 17   Ht _0  (1.651 m)   Wt 79.3 kg   SpO2 96%   BMI 29.09 kg/m   Physical Exam Vitals signs and nursing note reviewed.  Constitutional:      General: She is not in acute distress.    Appearance: She is well-developed. She is not ill-appearing.  HENT:     Head: Normocephalic and atraumatic.     Nose: Nose normal.     Mouth/Throat:     Mouth: Mucous membranes are moist.  Eyes:     Conjunctiva/sclera: Conjunctivae normal.  Neck:     Musculoskeletal: Neck supple.  Cardiovascular:     Rate and Rhythm:  Normal rate. Rhythm irregularly irregular.     Heart sounds: Normal heart sounds. No murmur.     Comments: Central line present left anterior chest. Pulmonary:     Effort: Pulmonary effort is normal. No respiratory distress.     Breath sounds: Normal breath sounds.  Abdominal:     General: Abdomen is flat. Bowel sounds are normal. There is no distension.     Palpations: Abdomen is soft.     Tenderness: There is no abdominal tenderness. There is no guarding or rebound.  Musculoskeletal:     Right lower leg: No edema.     Left lower leg: No edema.  Skin:    General: Skin is warm and dry.  Neurological:     General: No focal deficit present.     Mental Status: She is alert and oriented to person, place, and time. Mental status is at baseline.     Comments: She is able to lift both legs off the bed.  5/5 grip strength bilaterally.  There is no facial droop or slurred speech.  Psychiatric:        Mood and Affect: Mood normal.        Behavior: Behavior normal.      ED Treatments / Results  Labs (all labs ordered are listed, but only abnormal results  are displayed) Labs Reviewed  COMPREHENSIVE METABOLIC PANEL - Abnormal; Notable for the following components:      Result Value   Sodium 133 (*)    CO2 21 (*)    Creatinine, Ser 6.24 (*)    Total Protein 6.0 (*)    Alkaline Phosphatase 140 (*)    GFR calc non Af Amer 6 (*)    GFR calc Af Amer 7 (*)    All other components within normal limits  CBC WITH DIFFERENTIAL/PLATELET - Abnormal; Notable for the following components:   RBC 3.62 (*)    Hemoglobin 11.3 (*)    MCV 102.5 (*)    RDW 17.1 (*)    Platelets 148 (*)    Eosinophils Absolute 0.6 (*)    All other components within normal limits  C DIFFICILE QUICK SCREEN W PCR REFLEX  GASTROINTESTINAL PANEL BY PCR, STOOL (REPLACES STOOL CULTURE)  LIPASE, BLOOD  TROPONIN I    EKG EKG Interpretation  Date/Time:  Thursday September 06 2018 15:00:26 EDT Ventricular Rate:  67 PR  Interval:    QRS Duration: 110 QT Interval:  461 QTC Calculation: 487 R Axis:   -23 Text Interpretation:  Sinus or ectopic atrial rhythm Multiform ventricular premature complexes LVH with IVCD and secondary repol abnrm Borderline prolonged QT interval Baseline wander in lead(s) V2 Otherwise no significant change Confirmed by Deno Etienne [phone]) on 09/06/2018 5:48:29 PM   Radiology Dg Chest Port 1 View  Result Date: 09/06/2018 CLINICAL DATA:  Weakness. EXAM: PORTABLE CHEST 1 VIEW COMPARISON:  Chest x-ray dated Aug 14, 2017. FINDINGS: Unchanged tunneled left internal jugular dialysis catheter with tip in the proximal right atrium. The heart size and mediastinal contours are within normal limits. Atherosclerotic calcification of the aortic arch. Normal pulmonary vascularity. No focal consolidation, pleural effusion, or pneumothorax. No acute osseous abnormality. IMPRESSION: No active disease. Electronically Signed   By: Titus Dubin M.D.   On: 09/06/2018 16:29    Procedures Procedures (including critical care time)  Medications Ordered in ED Medications  sodium chloride 0.9 % bolus 500 mL (500 mLs Intravenous New Bag/Given 09/06/18 1838)     Initial Impression / Assessment and Plan / ED Course  I have reviewed the triage vital signs and the nursing notes.  Pertinent labs & imaging results that were available during my care of the patient were reviewed by me and considered in my medical decision making (see chart for details).  Clinical Course as of Sep 06 2051  Thu Sep 06, 2018  1755 Spoke with patient's daughter-in-law who correctly identified patient by 2 identifiers.  She states that patient has beginning stages of dementia.  She has been having the diarrhea since Sunday.  She confirms that there is been no blood in the diarrhea.  She has been trying to get patient to have increased p.o. intake.  She reports that if patient is not eating or drinking anything by mouth she will not have  diarrhea.  Will order p.o. to see how patient tolerates.  She has taken 23m of imodium 4x since    [EH]  2038 Reevaluated, she feels better after fluid bolus and p.o. intake.  She has not had any additional diarrhea.  She wishes to go home at this time.  She was able to ambulate in the room without difficulty using a walker.   [EH]    Clinical Course User Index [EH] HLorin Glass PA-C      Patient presents today for evaluation of  generally feeling weak.  She has been having diarrhea for approximately 4 days.  She denies any abdominal pain.  On exam her abdomen is soft, nontender nondistended.  She denies any blood in her diarrhea and her hemoglobin is 11.3 which is significantly higher than usual for her concerning for mild dehydration.  She is not tachycardic, her white count is not elevated, she does not appear to meet Sirs/sepsis criteria.  Her CMP is consistent with her baseline with elevated creatinine.  Her troponin is not elevated.  Her lipase is not elevated.  Chest x-ray does not show evidence of acute abnormalities, EKG without ischemic changes.  Given that her symptoms have been going on for multiple days delta troponin would most likely not be beneficial.    With concern for dehydration she was given 500 cc IV fluid while in the emergency room and allowed to eat and drink.  She did not have any vomiting or diarrhea while under my care.  She stated that she felt better after fluids and p.o. intake.  She was able to ambulate in the room with a walker without difficulty.  I spoke with patient's daughter multiple times by phone per patient's request.  Recommended increasing dose of Imodium as she has had a total of 4 mg over the past 4 days.  This patient was seen as a shared visit with Dr. Tyrone Nine.  Return precautions were discussed with patient and daughter in law who states their understanding.  At the time of discharge patient denied any unaddressed complaints or concerns.  Patient  is agreeable for discharge home.   Final Clinical Impressions(s) / ED Diagnoses   Final diagnoses:  Generalized weakness  Dehydration  Diarrhea, unspecified type    ED Discharge Orders    None       Ollen Gross 09/06/18 2111    Deno Etienne, DO 09/07/18 [phone]

## 2018-09-06 NOTE — ED Notes (Signed)
Please call her [place] at [phone] or [phone] to update her on patients condition.

## 2018-09-06 NOTE — ED Notes (Signed)
Pt given gingerale and crackers 

## 2018-09-06 NOTE — ED Notes (Signed)
Pt ambulated inside the room using a walker. Slow and steady gait.

## 2018-10-03 DIAGNOSIS — N186 End stage renal disease: Secondary | ICD-10-CM | POA: Diagnosis not present

## 2018-10-03 DIAGNOSIS — N185 Chronic kidney disease, stage 5: Secondary | ICD-10-CM | POA: Diagnosis not present

## 2018-10-03 DIAGNOSIS — Z4901 Encounter for fitting and adjustment of extracorporeal dialysis catheter: Secondary | ICD-10-CM | POA: Diagnosis not present

## 2018-10-03 DIAGNOSIS — E1121 Type 2 diabetes mellitus with diabetic nephropathy: Secondary | ICD-10-CM | POA: Diagnosis not present

## 2018-10-03 DIAGNOSIS — D689 Coagulation defect, unspecified: Secondary | ICD-10-CM | POA: Diagnosis not present

## 2018-10-03 DIAGNOSIS — N2581 Secondary hyperparathyroidism of renal origin: Secondary | ICD-10-CM | POA: Diagnosis not present

## 2018-10-05 DIAGNOSIS — N185 Chronic kidney disease, stage 5: Secondary | ICD-10-CM | POA: Diagnosis not present

## 2018-10-05 DIAGNOSIS — N186 End stage renal disease: Secondary | ICD-10-CM | POA: Diagnosis not present

## 2018-10-05 DIAGNOSIS — Z4901 Encounter for fitting and adjustment of extracorporeal dialysis catheter: Secondary | ICD-10-CM | POA: Diagnosis not present

## 2018-10-05 DIAGNOSIS — E1121 Type 2 diabetes mellitus with diabetic nephropathy: Secondary | ICD-10-CM | POA: Diagnosis not present

## 2018-10-05 DIAGNOSIS — N2581 Secondary hyperparathyroidism of renal origin: Secondary | ICD-10-CM | POA: Diagnosis not present

## 2018-10-05 DIAGNOSIS — D689 Coagulation defect, unspecified: Secondary | ICD-10-CM | POA: Diagnosis not present

## 2018-10-08 DIAGNOSIS — N185 Chronic kidney disease, stage 5: Secondary | ICD-10-CM | POA: Diagnosis not present

## 2018-10-08 DIAGNOSIS — Z4901 Encounter for fitting and adjustment of extracorporeal dialysis catheter: Secondary | ICD-10-CM | POA: Diagnosis not present

## 2018-10-08 DIAGNOSIS — E1121 Type 2 diabetes mellitus with diabetic nephropathy: Secondary | ICD-10-CM | POA: Diagnosis not present

## 2018-10-08 DIAGNOSIS — N2581 Secondary hyperparathyroidism of renal origin: Secondary | ICD-10-CM | POA: Diagnosis not present

## 2018-10-08 DIAGNOSIS — N186 End stage renal disease: Secondary | ICD-10-CM | POA: Diagnosis not present

## 2018-10-08 DIAGNOSIS — D689 Coagulation defect, unspecified: Secondary | ICD-10-CM | POA: Diagnosis not present

## 2018-10-10 DIAGNOSIS — Z4901 Encounter for fitting and adjustment of extracorporeal dialysis catheter: Secondary | ICD-10-CM | POA: Diagnosis not present

## 2018-10-10 DIAGNOSIS — N186 End stage renal disease: Secondary | ICD-10-CM | POA: Diagnosis not present

## 2018-10-10 DIAGNOSIS — D689 Coagulation defect, unspecified: Secondary | ICD-10-CM | POA: Diagnosis not present

## 2018-10-10 DIAGNOSIS — E1121 Type 2 diabetes mellitus with diabetic nephropathy: Secondary | ICD-10-CM | POA: Diagnosis not present

## 2018-10-10 DIAGNOSIS — N2581 Secondary hyperparathyroidism of renal origin: Secondary | ICD-10-CM | POA: Diagnosis not present

## 2018-10-10 DIAGNOSIS — N185 Chronic kidney disease, stage 5: Secondary | ICD-10-CM | POA: Diagnosis not present

## 2018-10-12 DIAGNOSIS — N186 End stage renal disease: Secondary | ICD-10-CM | POA: Diagnosis not present

## 2018-10-12 DIAGNOSIS — E1121 Type 2 diabetes mellitus with diabetic nephropathy: Secondary | ICD-10-CM | POA: Diagnosis not present

## 2018-10-12 DIAGNOSIS — D689 Coagulation defect, unspecified: Secondary | ICD-10-CM | POA: Diagnosis not present

## 2018-10-12 DIAGNOSIS — N185 Chronic kidney disease, stage 5: Secondary | ICD-10-CM | POA: Diagnosis not present

## 2018-10-12 DIAGNOSIS — Z4901 Encounter for fitting and adjustment of extracorporeal dialysis catheter: Secondary | ICD-10-CM | POA: Diagnosis not present

## 2018-10-12 DIAGNOSIS — N2581 Secondary hyperparathyroidism of renal origin: Secondary | ICD-10-CM | POA: Diagnosis not present

## 2018-10-15 DIAGNOSIS — N2581 Secondary hyperparathyroidism of renal origin: Secondary | ICD-10-CM | POA: Diagnosis not present

## 2018-10-15 DIAGNOSIS — Z4901 Encounter for fitting and adjustment of extracorporeal dialysis catheter: Secondary | ICD-10-CM | POA: Diagnosis not present

## 2018-10-15 DIAGNOSIS — E1121 Type 2 diabetes mellitus with diabetic nephropathy: Secondary | ICD-10-CM | POA: Diagnosis not present

## 2018-10-15 DIAGNOSIS — N185 Chronic kidney disease, stage 5: Secondary | ICD-10-CM | POA: Diagnosis not present

## 2018-10-15 DIAGNOSIS — N186 End stage renal disease: Secondary | ICD-10-CM | POA: Diagnosis not present

## 2018-10-15 DIAGNOSIS — D689 Coagulation defect, unspecified: Secondary | ICD-10-CM | POA: Diagnosis not present

## 2018-10-17 DIAGNOSIS — Z4901 Encounter for fitting and adjustment of extracorporeal dialysis catheter: Secondary | ICD-10-CM | POA: Diagnosis not present

## 2018-10-17 DIAGNOSIS — D689 Coagulation defect, unspecified: Secondary | ICD-10-CM | POA: Diagnosis not present

## 2018-10-17 DIAGNOSIS — N185 Chronic kidney disease, stage 5: Secondary | ICD-10-CM | POA: Diagnosis not present

## 2018-10-17 DIAGNOSIS — N186 End stage renal disease: Secondary | ICD-10-CM | POA: Diagnosis not present

## 2018-10-17 DIAGNOSIS — N2581 Secondary hyperparathyroidism of renal origin: Secondary | ICD-10-CM | POA: Diagnosis not present

## 2018-10-17 DIAGNOSIS — E1121 Type 2 diabetes mellitus with diabetic nephropathy: Secondary | ICD-10-CM | POA: Diagnosis not present

## 2018-10-18 ENCOUNTER — Other Ambulatory Visit: Payer: Self-pay | Admitting: Family Medicine

## 2018-10-18 DIAGNOSIS — S46122D Laceration of muscle, fascia and tendon of long head of biceps, left arm, subsequent encounter: Secondary | ICD-10-CM | POA: Diagnosis not present

## 2018-10-18 NOTE — Telephone Encounter (Signed)
appt rescheduled. Pt notified.

## 2018-10-19 DIAGNOSIS — N2581 Secondary hyperparathyroidism of renal origin: Secondary | ICD-10-CM | POA: Diagnosis not present

## 2018-10-19 DIAGNOSIS — N186 End stage renal disease: Secondary | ICD-10-CM | POA: Diagnosis not present

## 2018-10-19 DIAGNOSIS — N185 Chronic kidney disease, stage 5: Secondary | ICD-10-CM | POA: Diagnosis not present

## 2018-10-19 DIAGNOSIS — E1121 Type 2 diabetes mellitus with diabetic nephropathy: Secondary | ICD-10-CM | POA: Diagnosis not present

## 2018-10-19 DIAGNOSIS — D689 Coagulation defect, unspecified: Secondary | ICD-10-CM | POA: Diagnosis not present

## 2018-10-19 DIAGNOSIS — Z4901 Encounter for fitting and adjustment of extracorporeal dialysis catheter: Secondary | ICD-10-CM | POA: Diagnosis not present

## 2018-10-22 DIAGNOSIS — D689 Coagulation defect, unspecified: Secondary | ICD-10-CM | POA: Diagnosis not present

## 2018-10-22 DIAGNOSIS — Z4901 Encounter for fitting and adjustment of extracorporeal dialysis catheter: Secondary | ICD-10-CM | POA: Diagnosis not present

## 2018-10-22 DIAGNOSIS — N185 Chronic kidney disease, stage 5: Secondary | ICD-10-CM | POA: Diagnosis not present

## 2018-10-22 DIAGNOSIS — N186 End stage renal disease: Secondary | ICD-10-CM | POA: Diagnosis not present

## 2018-10-22 DIAGNOSIS — N2581 Secondary hyperparathyroidism of renal origin: Secondary | ICD-10-CM | POA: Diagnosis not present

## 2018-10-22 DIAGNOSIS — E1169 Type 2 diabetes mellitus with other specified complication: Secondary | ICD-10-CM | POA: Diagnosis not present

## 2018-10-22 DIAGNOSIS — E1121 Type 2 diabetes mellitus with diabetic nephropathy: Secondary | ICD-10-CM | POA: Diagnosis not present

## 2018-10-24 DIAGNOSIS — N185 Chronic kidney disease, stage 5: Secondary | ICD-10-CM | POA: Diagnosis not present

## 2018-10-24 DIAGNOSIS — Z4901 Encounter for fitting and adjustment of extracorporeal dialysis catheter: Secondary | ICD-10-CM | POA: Diagnosis not present

## 2018-10-24 DIAGNOSIS — D689 Coagulation defect, unspecified: Secondary | ICD-10-CM | POA: Diagnosis not present

## 2018-10-24 DIAGNOSIS — N186 End stage renal disease: Secondary | ICD-10-CM | POA: Diagnosis not present

## 2018-10-24 DIAGNOSIS — N2581 Secondary hyperparathyroidism of renal origin: Secondary | ICD-10-CM | POA: Diagnosis not present

## 2018-10-24 DIAGNOSIS — E1121 Type 2 diabetes mellitus with diabetic nephropathy: Secondary | ICD-10-CM | POA: Diagnosis not present

## 2018-10-25 ENCOUNTER — Encounter: Payer: Self-pay | Admitting: Family Medicine

## 2018-10-25 ENCOUNTER — Other Ambulatory Visit: Payer: Self-pay

## 2018-10-25 DIAGNOSIS — J449 Chronic obstructive pulmonary disease, unspecified: Secondary | ICD-10-CM | POA: Insufficient documentation

## 2018-10-25 DIAGNOSIS — M109 Gout, unspecified: Secondary | ICD-10-CM | POA: Insufficient documentation

## 2018-10-25 DIAGNOSIS — F419 Anxiety disorder, unspecified: Secondary | ICD-10-CM | POA: Insufficient documentation

## 2018-10-25 DIAGNOSIS — I10 Essential (primary) hypertension: Secondary | ICD-10-CM | POA: Insufficient documentation

## 2018-10-25 DIAGNOSIS — K219 Gastro-esophageal reflux disease without esophagitis: Secondary | ICD-10-CM | POA: Insufficient documentation

## 2018-10-25 NOTE — Progress Notes (Signed)
New Patient Office Visit  Assessment & Plan:  1-2. Anxiety/Reactive depression - Patient is going to wean off Ativan by decreasing to 0.5 mg QOD x3 weeks, then decrease to 2x/week x3 weeks, then stop. After she is completely weaned off Ativan we will wean off Celexa if she still wants to do so. I have given a supply of Ativan so that she will have enough to accomplish this.  - citalopram (CELEXA) 40 MG tablet; Take 1 tablet (40 mg total) by mouth daily.  Dispense: 90 tablet; Refill: 1 - LORazepam (ATIVAN) 0.5 MG tablet; Take 1 tablet (0.5 mg total) by mouth every other day.  Dispense: 15 tablet; Refill: 0  3. Restless leg syndrome - Discussed colchicine is not appropriate treatment for RLS. Gabapentin D/C'd as she is a fall risk.  - pramipexole (MIRAPEX) 0.125 MG tablet; Take 1 tablet (0.125 mg total) by mouth at bedtime.  Dispense: 30 tablet; Refill: 2  4. Chronic gout without tophus, unspecified cause, unspecified site - Discussed appropriate use of colchicine. She will stop taking QD.  - colchicine 0.6 MG tablet; Take 2 tablets by mouth at onset of gout flare and then 1 tablet 1 hour later. Use as needed.  Dispense: 15 tablet; Refill: 1  5. Essential hypertension - Well controlled on current regimen.   6. Chronic obstructive pulmonary disease, unspecified COPD type ([place]) - Controlled on current regimen. Managed by Dr. Melvyn Novas (pulmonologist).  - albuterol (VENTOLIN HFA) 108 (90 Base) MCG/ACT inhaler; Inhale 2 puffs into the lungs every 6 (six) hours as needed for wheezing or shortness of breath.  Dispense: 18 g; Refill: 2 - budesonide-formoterol (SYMBICORT) 80-4.5 MCG/ACT inhaler; Inhale 2 puffs into the lungs 2 (two) times daily.  Dispense: 3 Inhaler; Refill: 4  7. Vitamin D deficiency - Vitamin D, Ergocalciferol, (DRISDOL) 1.25 MG (50000 UT) CAPS capsule; Take 1 capsule (50,000 Units total) by mouth every Monday.  Dispense: 12 capsule; Refill: 3  8. Age-related osteoporosis without  current pathological fracture - DEXA results requested from [facility]. - alendronate (FOSAMAX) 70 MG tablet; Take 1 tablet (70 mg total) by mouth every Thursday.  Dispense: 12 tablet; Refill: 3  9. Dyslipidemia - Managed by cardiologist.  - ezetimibe (ZETIA) 10 MG tablet; Take 1 tablet (10 mg total) by mouth daily.  Dispense: 90 tablet; Refill: 3  10. Gastroesophageal reflux disease without esophagitis - Not well controlled. Famotidine added.  - pantoprazole (PROTONIX) 40 MG tablet; Take 1 tablet (40 mg total) by mouth 2 (two) times daily.  Dispense: 180 tablet; Refill: 3 - famotidine (PEPCID) 20 MG tablet; Take 1 tablet (20 mg total) by mouth 2 (two) times daily.  Dispense: 60 tablet; Refill: 2  11. Hypothyroidism, unspecified type - Well controlled on current regimen.  - levothyroxine (SYNTHROID) 100 MCG tablet; Take 1 tablet (100 mcg total) by mouth daily.  Dispense: 90 tablet; Refill: 3  12. ESRD (end stage renal disease) on dialysis [facility]) - Dialysis MWF. Managed by Dr. Lorrene Reid (nephrologist).  13. PAF (paroxysmal atrial fibrillation) ([place]) - Well controlled on current regimen. Managed by cardiologist.   14. Encounter to establish care   Follow-up: Return in about 2 weeks (around 11/09/2018) for CSA.   [name], MSN, APRN, FNP-C [facility]  Subjective:  Patient ID: [name], female    DOB: 1940/04/20  Age: 78 y.o. MRN: [id number]  Patient Care Team: [name], FNP as PCP - General (Family Medicine) Minus Breeding, MD as Consulting Physician (  Cardiology) Tanda Rockers, MD as Consulting Physician (Pulmonary Disease) Jamal Maes, MD as Consulting Physician (Nephrology)  CC:  Chief Complaint  Patient presents with   Establish Care    HPI [name] presents to establish care. She is transferring care from Dr. Murrell Redden office as he has retired and the office has closed. Patient is accompanied by daughter-in-law, who is also  her POA and patient is okay with her being present. Patient is in need of refills today.   Depression: Patient was started on Celexa in 05-21-15 when her husband passed away and is questioning if she still needs to be on it.   Depression screen [facility] 2/9 10/26/2018 10/26/2018  Decreased Interest 0 0  Down, Depressed, Hopeless 0 0  PHQ - 2 Score 0 0  Altered sleeping 1 -  Tired, decreased energy 1 -  Change in appetite 0 -  Feeling bad or failure about yourself  2 -  Trouble concentrating 0 -  Moving slowly or fidgety/restless 0 -  Suicidal thoughts 0 -  PHQ-9 Score 4 -  Difficult doing work/chores Not difficult at all -   GAD 7 : Generalized Anxiety Score 10/26/2018  Nervous, Anxious, on Edge 0  Control/stop worrying 0  Worry too much - different things 0  Trouble relaxing 1  Restless 2  Easily annoyed or irritable 2  Afraid - awful might happen 0  Total GAD 7 Score 5  Anxiety Difficulty Not difficult at all   Anxiety: patient was placed on Ativan "at the request of her daughter" due to anxiety. Patient does not feel she needs this and only takes it at bedtime.   Osteoporosis: reports she had a DEXA scan last year at [facility].   Gout: patient is taking colchicine once daily for her "legs at night".   Restless Leg Syndrome: patient has been taking gabapentin at bedtime for restless legs.   Pain: patient takes 1/2 tablet of Norco 5/325 2-3x/week for arthritis pains and back pain. Unable to take NSAIDs due to ESRD.    Review of Systems  Constitutional: Negative for chills, fever, malaise/fatigue and weight loss.  HENT: Positive for hearing loss (left ear). Negative for congestion, ear discharge, ear pain, nosebleeds, sinus pain, sore throat and tinnitus.   Eyes: Negative for blurred vision, double vision, pain, discharge and redness.  Respiratory: Positive for shortness of breath and wheezing. Negative for cough.   Cardiovascular: Negative for chest pain, palpitations and leg  swelling.  Gastrointestinal: Positive for diarrhea. Negative for abdominal pain, constipation, heartburn, nausea and vomiting.  Genitourinary: Negative for dysuria, frequency and urgency.  Musculoskeletal: Positive for back pain and joint pain. Negative for myalgias.  Skin: Negative for rash.  Neurological: Positive for weakness. Negative for dizziness, seizures and headaches.  Psychiatric/Behavioral: Positive for memory loss. Negative for depression, substance abuse and suicidal ideas. The patient is not nervous/anxious.     Current Outpatient Medications:    acetaminophen (TYLENOL) 500 MG tablet, Take 1,000 mg by mouth as needed for mild pain or headache., Disp: , Rfl:    albuterol (VENTOLIN HFA) 108 (90 Base) MCG/ACT inhaler, Inhale 2 puffs into the lungs every 6 (six) hours as needed for wheezing or shortness of breath., Disp: 18 g, Rfl: 2   [START ON 11/01/2018] alendronate (FOSAMAX) 70 MG tablet, Take 1 tablet (70 mg total) by mouth every 05-20-2022., Disp: 12 tablet, Rfl: 3   aspirin EC 81 MG tablet, Take 81 mg by mouth daily., Disp: , Rfl:  budesonide-formoterol (SYMBICORT) 80-4.5 MCG/ACT inhaler, Inhale 2 puffs into the lungs 2 (two) times daily., Disp: 3 Inhaler, Rfl: 4   cinacalcet (SENSIPAR) 30 MG tablet, Take 1 tablet (30 mg total) by mouth every Monday, Wednesday, and Friday., Disp: 60 tablet, Rfl: 0   citalopram (CELEXA) 40 MG tablet, Take 1 tablet (40 mg total) by mouth daily., Disp: 90 tablet, Rfl: 1   colchicine 0.6 MG tablet, Take 2 tablets by mouth at onset of gout flare and then 1 tablet 1 hour later. Use as needed., Disp: 15 tablet, Rfl: 1   doxercalciferol (HECTOROL) 4 MCG/2ML injection, Inject 0.5 mLs (1 mcg total) into the vein every Monday, Wednesday, and Friday with hemodialysis., Disp: 2 mL, Rfl: 0   ezetimibe (ZETIA) 10 MG tablet, Take 1 tablet (10 mg total) by mouth daily., Disp: 90 tablet, Rfl: 3   Ferric Citrate (AURYXIA) 1 GM 210 MG(Fe) TABS, Take 420 mg  by mouth 3 (three) times daily before meals. , Disp: , Rfl:    HYDROcodone-acetaminophen (NORCO/VICODIN) 5-325 MG tablet, Take 1 tablet by mouth every 6 (six) hours as needed for moderate pain., Disp: 14 tablet, Rfl: 0   levothyroxine (SYNTHROID) 100 MCG tablet, Take 1 tablet (100 mcg total) by mouth daily., Disp: 90 tablet, Rfl: 3   LORazepam (ATIVAN) 0.5 MG tablet, Take 1 tablet (0.5 mg total) by mouth every other day., Disp: 15 tablet, Rfl: 0   midodrine (PROAMATINE) 10 MG tablet, Take 1 tablet (10 mg total) by mouth See admin instructions. Take 1 tablet by mouth 3 times a day; expected on dialysis days Monday, Wednesday and Friday every 4 hours, Disp: 180 tablet, Rfl: 3   Multiple Vitamin (MULTIVITAMIN) tablet, Take 1 tablet by mouth daily., Disp: , Rfl:    multivitamin (RENA-VIT) TABS tablet, Take 1 tablet by mouth daily., Disp: , Rfl:    nystatin cream (MYCOSTATIN), Apply 1 application topically 2 (two) times daily as needed for dry skin., Disp: 30 g, Rfl: 0   Vitamin D, Ergocalciferol, (DRISDOL) 1.25 MG (50000 UT) CAPS capsule, Take 1 capsule (50,000 Units total) by mouth every Monday., Disp: 12 capsule, Rfl: 3   famotidine (PEPCID) 20 MG tablet, Take 1 tablet (20 mg total) by mouth 2 (two) times daily., Disp: 60 tablet, Rfl: 2   pantoprazole (PROTONIX) 40 MG tablet, Take 1 tablet (40 mg total) by mouth 2 (two) times daily., Disp: 180 tablet, Rfl: 3   pramipexole (MIRAPEX) 0.125 MG tablet, Take 1 tablet (0.125 mg total) by mouth at bedtime., Disp: 30 tablet, Rfl: 2  Allergies  Allergen Reactions   Amoxicillin Rash    Has patient had a PCN reaction causing immediate rash, facial/tongue/throat swelling, SOB or lightheadedness with hypotension:YES Has patient had a PCN reaction causing severe rash involving mucus membranes or skin necrosis: Yes Has patient had a PCN reaction that required hospitalization No Has patient had a PCN reaction occurring within the last 10 years: Yes If  all of the above answers are "NO", then may proceed with Cephalosporin use.    Sulfa Drugs Cross Reactors Hives and Itching   Percocet [Oxycodone-Acetaminophen] Itching and Rash    Did not happen last time she took it   Sulfa Antibiotics Hives    Past Medical History:  Diagnosis Date   Acute respiratory failure ([place]) 05/2017   Anemia    Anxiety    Arthritis    COPD (chronic obstructive pulmonary disease) ([facility])    Depression    Diabetes mellitus    "  diet controlled"   ESRD (end stage renal disease) ([place])    dialysis MWF   GERD (gastroesophageal reflux disease)    Gout    History of blood transfusion    HLD (hyperlipidemia)    HOH (hard of hearing)    left ear   Hypertension    hypotensive -since starting dialysis   Hypothyroidism    MRSA (methicillin resistant staph aureus) culture positive 06/01/2017   PAF (paroxysmal atrial fibrillation) ([place])    a. 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   Wears dentures    Wears glasses     Past Surgical History:  Procedure Laterality Date   AV FISTULA PLACEMENT  04/03/2012   Procedure: ARTERIOVENOUS (AV) FISTULA CREATION;  Surgeon: Elam Dutch, MD;  Location: [place];  Service: Vascular;  Laterality: Left;  creation left brachial cephalic fistula    BIOPSY  08/21/2018   Procedure: BIOPSY;  Surgeon: Thornton Park, MD;  Location: [place];  Service: Gastroenterology;;   CARPAL TUNNEL RELEASE Bilateral    COLONOSCOPY W/ POLYPECTOMY     COLONOSCOPY WITH PROPOFOL N/A 08/21/2018   Procedure: COLONOSCOPY WITH PROPOFOL;  Surgeon: Thornton Park, MD;  Location: [facility] ENDOSCOPY;  Service: Gastroenterology;  Laterality: N/A;   DILATION AND CURETTAGE OF UTERUS      ESOPHAGOGASTRODUODENOSCOPY (EGD) WITH PROPOFOL N/A 08/21/2018   Procedure: ESOPHAGOGASTRODUODENOSCOPY (EGD) WITH PROPOFOL;  Surgeon: Thornton Park, MD;  Location: [place];  Service: Gastroenterology;  Laterality: N/A;   EYE SURGERY Bilateral    bilateral cataract removal   HEMATOMA EVACUATION Left 05/14/2018   Procedure: Incision and Drainage of Left Arm Hematoma;  Surgeon: Elam Dutch, MD;  Location: [place];  Service: Vascular;  Laterality: Left;   Hemodialysis  catheter Right    IR FLUORO GUIDE CV LINE RIGHT  05/26/2017   IR REMOVAL TUN CV CATH W/O FL  05/24/2017   IR US GUIDE VASC ACCESS RIGHT  05/26/2017   LIGATION OF ARTERIOVENOUS  FISTULA Left 02/04/2015   Procedure: LIGATION OF BRACHIOCEPHALIC ARTERIOVENOUS  FISTULA;  Surgeon: Conrad Yanceyville, MD;  Location: [place];  Service: Vascular;  Laterality: Left;   MULTIPLE TOOTH EXTRACTIONS     POLYPECTOMY  08/21/2018   Procedure: POLYPECTOMY;  Surgeon: Thornton Park, MD;  Location: [place];  Service: Gastroenterology;;   PORTACATH PLACEMENT     REVERSE SHOULDER ARTHROPLASTY Left 01/22/2016   Procedure: LEFT REVERSE SHOULDER ARTHROPLASTY;  Surgeon: Netta Cedars, MD;  Location: [place];  Service: Orthopedics;  Laterality: Left;   SHOULDER ARTHROSCOPY Bilateral    SPLIT NIGHT STUDY  07/26/2015   STERIOD INJECTION Right 01/22/2016   Procedure: RIGHT RING FINGER STEROID INJECTION;  Surgeon: Netta Cedars, MD;  Location: [place];  Service: Orthopedics;  Laterality: Right;   TEE WITHOUT CARDIOVERSION N/A 05/26/2017   Procedure: TRANSESOPHAGEAL ECHOCARDIOGRAM (TEE);  Surgeon: Lelon Perla, MD;  Location: [place];  Service: Cardiovascular;  Laterality: N/A;   THROMBECTOMY BRACHIAL ARTERY Left 02/06/2015   Procedure: EVACUATION OF LEFT ARM HEMATOMA;  Surgeon: Angelia Mould, MD;  Location: [facility] OR;  Service: Vascular;  Laterality: Left;   TOTAL KNEE ARTHROPLASTY Bilateral    TUBAL LIGATION      Family History    Problem Relation Age  of Onset   Arthritis Mother    Diabetes Father        before age 68   Heart disease Father    Diabetes Sister    Cancer Brother    Hyperlipidemia Daughter    Hypertension Daughter    Diabetes Daughter    Hypertension Son    Hyperlipidemia Son    Dementia Sister    Cancer Sister        Unknown   Other Sister        Died in car accident   Stroke Brother    Diabetes Daughter    Hypertension Daughter    Hyperlipidemia Daughter    Hepatitis C Son     Social History   Socioeconomic History   Marital status: Widowed    Spouse name: Not on file   Number of children: 6   Years of education: 10   Highest education level: 10th grade  Occupational History   Occupation: RETIRED  Scientist, product/process development strain: Not hard at all   Food insecurity    Worry: Never true    Inability: Never true   Transportation needs    Medical: No    Non-medical: No  Tobacco Use   Smoking status: Former Smoker    Packs/day: 0.25    Years: 2.00    Pack years: 0.50    Types: Cigarettes    Quit date: 04/04/1998    Years since quitting: 20.5   Smokeless tobacco: Never Used  Substance and Sexual Activity   Alcohol use: No    Alcohol/week: 0.0 standard drinks   Drug use: No   Sexual activity: Not Currently    Birth control/protection: None  Lifestyle   Physical activity    Days per week: 0 days    Minutes per session: 0 min   Stress: Not at all  Relationships   Social connections    Talks on phone: More than three times a week    Gets together: Three times a week    Attends religious service: More than 4 times per year    Active member of club or organization: No    Attends meetings of clubs or organizations: Never    Relationship status: Widowed   Intimate partner violence    Fear of current or ex partner: No    Emotionally abused: No    Physically abused: No    Forced sexual activity: No  Other Topics Concern    Not on file  Social History Narrative   ** Merged History Encounter **       Widowed 6 children Lives with son and daughter in law homemaker    Objective:   Today's Vitals: BP (!) 133/94    Pulse (!) 108    Temp 98 F (36.7 C) (Oral)    Ht _0  (1.651 m)    Wt 178 lb (80.7 kg)    BMI 29.62 kg/m   Physical Exam Vitals signs reviewed.  Constitutional:      General: She is not in acute distress.    Appearance: Normal appearance. She is overweight. She is not ill-appearing, toxic-appearing or diaphoretic.  HENT:     Head: Normocephalic and atraumatic.     Right Ear: Tympanic membrane, ear canal and external ear normal. There is no impacted cerumen.     Left Ear: Tympanic membrane, ear canal and external ear normal. There is no impacted cerumen.  Eyes:     General: No scleral  icterus.       Right eye: No discharge.        Left eye: No discharge.     Extraocular Movements: Extraocular movements intact.     Conjunctiva/sclera: Conjunctivae normal.     Pupils: Pupils are equal, round, and reactive to light.  Neck:     Musculoskeletal: Normal range of motion.  Cardiovascular:     Rate and Rhythm: Normal rate and regular rhythm.     Heart sounds: Normal heart sounds. No murmur. No friction rub. No gallop.   Pulmonary:     Effort: Pulmonary effort is normal. No respiratory distress.     Breath sounds: Normal breath sounds. No stridor. No wheezing, rhonchi or rales.  Chest:     Comments: Dialysis access site (covered) to left upper chest.  Skin:    General: Skin is warm and dry.     Capillary Refill: Capillary refill takes less than 2 seconds.  Neurological:     General: No focal deficit present.     Mental Status: She is alert and oriented to person, place, and time. Mental status is at baseline.  Psychiatric:        Mood and Affect: Mood normal.        Behavior: Behavior normal.        Thought Content: Thought content normal.        Judgment: Judgment normal.

## 2018-10-26 ENCOUNTER — Ambulatory Visit (INDEPENDENT_AMBULATORY_CARE_PROVIDER_SITE_OTHER): Payer: Medicare Other | Admitting: Family Medicine

## 2018-10-26 ENCOUNTER — Encounter: Payer: Self-pay | Admitting: Family Medicine

## 2018-10-26 VITALS — BP 133/94 | HR 108 | Temp 98.0°F | Ht 65.0 in | Wt 178.0 lb

## 2018-10-26 DIAGNOSIS — I10 Essential (primary) hypertension: Secondary | ICD-10-CM

## 2018-10-26 DIAGNOSIS — G2581 Restless legs syndrome: Secondary | ICD-10-CM | POA: Diagnosis not present

## 2018-10-26 DIAGNOSIS — N185 Chronic kidney disease, stage 5: Secondary | ICD-10-CM | POA: Diagnosis not present

## 2018-10-26 DIAGNOSIS — Z992 Dependence on renal dialysis: Secondary | ICD-10-CM

## 2018-10-26 DIAGNOSIS — J449 Chronic obstructive pulmonary disease, unspecified: Secondary | ICD-10-CM

## 2018-10-26 DIAGNOSIS — N2581 Secondary hyperparathyroidism of renal origin: Secondary | ICD-10-CM | POA: Diagnosis not present

## 2018-10-26 DIAGNOSIS — D689 Coagulation defect, unspecified: Secondary | ICD-10-CM | POA: Diagnosis not present

## 2018-10-26 DIAGNOSIS — F329 Major depressive disorder, single episode, unspecified: Secondary | ICD-10-CM | POA: Diagnosis not present

## 2018-10-26 DIAGNOSIS — M810 Age-related osteoporosis without current pathological fracture: Secondary | ICD-10-CM

## 2018-10-26 DIAGNOSIS — E559 Vitamin D deficiency, unspecified: Secondary | ICD-10-CM

## 2018-10-26 DIAGNOSIS — F419 Anxiety disorder, unspecified: Secondary | ICD-10-CM

## 2018-10-26 DIAGNOSIS — M1A9XX0 Chronic gout, unspecified, without tophus (tophi): Secondary | ICD-10-CM

## 2018-10-26 DIAGNOSIS — N186 End stage renal disease: Secondary | ICD-10-CM | POA: Diagnosis not present

## 2018-10-26 DIAGNOSIS — I480 Paroxysmal atrial fibrillation: Secondary | ICD-10-CM

## 2018-10-26 DIAGNOSIS — Z7689 Persons encountering health services in other specified circumstances: Secondary | ICD-10-CM

## 2018-10-26 DIAGNOSIS — Z4901 Encounter for fitting and adjustment of extracorporeal dialysis catheter: Secondary | ICD-10-CM | POA: Diagnosis not present

## 2018-10-26 DIAGNOSIS — E785 Hyperlipidemia, unspecified: Secondary | ICD-10-CM

## 2018-10-26 DIAGNOSIS — E1121 Type 2 diabetes mellitus with diabetic nephropathy: Secondary | ICD-10-CM | POA: Diagnosis not present

## 2018-10-26 DIAGNOSIS — K219 Gastro-esophageal reflux disease without esophagitis: Secondary | ICD-10-CM

## 2018-10-26 DIAGNOSIS — E039 Hypothyroidism, unspecified: Secondary | ICD-10-CM

## 2018-10-26 MED ORDER — PRAMIPEXOLE DIHYDROCHLORIDE 0.125 MG PO TABS: 0.1250 mg | ORAL_TABLET | Freq: Every day | ORAL | 2 refills | Status: DC

## 2018-10-26 MED ORDER — LEVOTHYROXINE SODIUM 100 MCG PO TABS: 100.0000 ug | ORAL_TABLET | Freq: Every day | ORAL | 3 refills | Status: DC

## 2018-10-26 MED ORDER — LORAZEPAM 0.5 MG PO TABS: 0.5000 mg | ORAL_TABLET | ORAL | 0 refills | Status: DC

## 2018-10-26 MED ORDER — ALBUTEROL SULFATE HFA 108 (90 BASE) MCG/ACT IN AERS: 2.0000 | INHALATION_SPRAY | Freq: Four times a day (QID) | RESPIRATORY_TRACT | 2 refills | Status: DC | PRN

## 2018-10-26 MED ORDER — EZETIMIBE 10 MG PO TABS: 10.0000 mg | ORAL_TABLET | Freq: Every day | ORAL | 3 refills | Status: DC

## 2018-10-26 MED ORDER — VITAMIN D (ERGOCALCIFEROL) 1.25 MG (50000 UNIT) PO CAPS: 50000.0000 [IU] | ORAL_CAPSULE | ORAL | 3 refills | Status: DC

## 2018-10-26 MED ORDER — CITALOPRAM HYDROBROMIDE 40 MG PO TABS: 40.0000 mg | ORAL_TABLET | Freq: Every day | ORAL | 1 refills | Status: DC

## 2018-10-26 MED ORDER — PANTOPRAZOLE SODIUM 40 MG PO TBEC: 40.0000 mg | DELAYED_RELEASE_TABLET | Freq: Two times a day (BID) | ORAL | 3 refills | Status: DC

## 2018-10-26 MED ORDER — BUDESONIDE-FORMOTEROL FUMARATE 80-4.5 MCG/ACT IN AERO: 2.0000 | INHALATION_SPRAY | Freq: Two times a day (BID) | RESPIRATORY_TRACT | 4 refills | Status: DC

## 2018-10-26 MED ORDER — COLCHICINE 0.6 MG PO TABS: ORAL_TABLET | ORAL | 1 refills | Status: DC

## 2018-10-26 MED ORDER — ALENDRONATE SODIUM 70 MG PO TABS: 70.0000 mg | ORAL_TABLET | ORAL | 3 refills | Status: DC

## 2018-10-26 MED ORDER — NYSTATIN 100000 UNIT/GM EX CREA: 1.0000 "application " | TOPICAL_CREAM | Freq: Two times a day (BID) | CUTANEOUS | 0 refills | Status: DC | PRN

## 2018-10-26 MED ORDER — FAMOTIDINE 20 MG PO TABS: 20.0000 mg | ORAL_TABLET | Freq: Two times a day (BID) | ORAL | 2 refills | Status: DC

## 2018-10-26 NOTE — Patient Instructions (Addendum)
Decrease Ativan to every other day x3 weeks, then decrease to twice a week x3 weeks, then stop all together.    Preventive Care 44 Years and Older, Female Preventive care refers to lifestyle choices and visits with your health care provider that can promote health and wellness. This includes:  A yearly physical exam. This is also called an annual well check.  Regular dental and eye exams.  Immunizations.  Screening for certain conditions.  Healthy lifestyle choices, such as diet and exercise. What can I expect for my preventive care visit? Physical exam Your health care provider will check:  Height and weight. These may be used to calculate body mass index (BMI), which is a measurement that tells if you are at a healthy weight.  Heart rate and blood pressure.  Your skin for abnormal spots. Counseling Your health care provider may ask you questions about:  Alcohol, tobacco, and drug use.  Emotional well-being.  Home and relationship well-being.  Sexual activity.  Eating habits.  History of falls.  Memory and ability to understand (cognition).  Work and work Statistician.  Pregnancy and menstrual history. What immunizations do I need?  Influenza (flu) vaccine  This is recommended every year. Tetanus, diphtheria, and pertussis (Tdap) vaccine  You may need a Td booster every 10 years. Varicella (chickenpox) vaccine  You may need this vaccine if you have not already been vaccinated. Zoster (shingles) vaccine  You may need this after age 48. Pneumococcal conjugate (PCV13) vaccine  One dose is recommended after age 26. Pneumococcal polysaccharide (PPSV23) vaccine  One dose is recommended after age 71. Measles, mumps, and rubella (MMR) vaccine  You may need at least one dose of MMR if you were born in 1957 or later. You may also need a second dose. Meningococcal conjugate (MenACWY) vaccine  You may need this if you have certain conditions. Hepatitis A  vaccine  You may need this if you have certain conditions or if you travel or work in places where you may be exposed to hepatitis A. Hepatitis B vaccine  You may need this if you have certain conditions or if you travel or work in places where you may be exposed to hepatitis B. Haemophilus influenzae type b (Hib) vaccine  You may need this if you have certain conditions. You may receive vaccines as individual doses or as more than one vaccine together in one shot (combination vaccines). Talk with your health care provider about the risks and benefits of combination vaccines. What tests do I need? Blood tests  Lipid and cholesterol levels. These may be checked every 5 years, or more frequently depending on your overall health.  Hepatitis C test.  Hepatitis B test. Screening  Lung cancer screening. You may have this screening every year starting at age 48 if you have a 30-pack-year history of smoking and currently smoke or have quit within the past 15 years.  Colorectal cancer screening. All adults should have this screening starting at age 59 and continuing until age 67. Your health care provider may recommend screening at age 66 if you are at increased risk. You will have tests every 1-10 years, depending on your results and the type of screening test.  Diabetes screening. This is done by checking your blood sugar (glucose) after you have not eaten for a while (fasting). You may have this done every 1-3 years.  Mammogram. This may be done every 1-2 years. Talk with your health care provider about how often you should have  regular mammograms.  BRCA-related cancer screening. This may be done if you have a family history of breast, ovarian, tubal, or peritoneal cancers. Other tests  Sexually transmitted disease (STD) testing.  Bone density scan. This is done to screen for osteoporosis. You may have this done starting at age 57. Follow these instructions at home: Eating and  drinking  Eat a diet that includes fresh fruits and vegetables, whole grains, lean protein, and low-fat dairy products. Limit your intake of foods with high amounts of sugar, saturated fats, and salt.  Take vitamin and mineral supplements as recommended by your health care provider.  Do not drink alcohol if your health care provider tells you not to drink.  If you drink alcohol: ? Limit how much you have to 0-1 drink a day. ? Be aware of how much alcohol is in your drink. In the [place] one drink equals one 12 oz bottle of beer (355 mL), one 5 oz glass of wine (148 mL), or one 1 oz glass of hard liquor (44 mL). Lifestyle  Take daily care of your teeth and gums.  Stay active. Exercise for at least 30 minutes on 5 or more days each week.  Do not use any products that contain nicotine or tobacco, such as cigarettes, e-cigarettes, and chewing tobacco. If you need help quitting, ask your health care provider.  If you are sexually active, practice safe sex. Use a condom or other form of protection in order to prevent STIs (sexually transmitted infections).  Talk with your health care provider about taking a low-dose aspirin or statin. What's next?  Go to your health care provider once a year for a well check visit.  Ask your health care provider how often you should have your eyes and teeth checked.  Stay up to date on all vaccines. This information is not intended to replace advice given to you by your health care provider. Make sure you discuss any questions you have with your health care provider. Document Released: 04/17/2015 Document Revised: 03/15/2018 Document Reviewed: 03/15/2018 Elsevier Patient Education  2020 [employer].

## 2018-10-28 DIAGNOSIS — I480 Paroxysmal atrial fibrillation: Secondary | ICD-10-CM | POA: Diagnosis not present

## 2018-10-29 DIAGNOSIS — Z4901 Encounter for fitting and adjustment of extracorporeal dialysis catheter: Secondary | ICD-10-CM | POA: Diagnosis not present

## 2018-10-29 DIAGNOSIS — D689 Coagulation defect, unspecified: Secondary | ICD-10-CM | POA: Diagnosis not present

## 2018-10-29 DIAGNOSIS — N2581 Secondary hyperparathyroidism of renal origin: Secondary | ICD-10-CM | POA: Diagnosis not present

## 2018-10-29 DIAGNOSIS — N185 Chronic kidney disease, stage 5: Secondary | ICD-10-CM | POA: Diagnosis not present

## 2018-10-29 DIAGNOSIS — N186 End stage renal disease: Secondary | ICD-10-CM | POA: Diagnosis not present

## 2018-10-29 DIAGNOSIS — E1121 Type 2 diabetes mellitus with diabetic nephropathy: Secondary | ICD-10-CM | POA: Diagnosis not present

## 2018-10-30 MED ORDER — BUDESONIDE-FORMOTEROL FUMARATE 80-4.5 MCG/ACT IN AERO: 2.0000 | INHALATION_SPRAY | Freq: Two times a day (BID) | RESPIRATORY_TRACT | 4 refills | Status: DC

## 2018-10-30 NOTE — Progress Notes (Signed)
Patient does not have a chart in [place].   I have filled a paper out for front staff to pursue documentation , if possible, for the td or tdap.     I tried to call [facility] but there was not an option for medical information.

## 2018-10-31 ENCOUNTER — Ambulatory Visit (INDEPENDENT_AMBULATORY_CARE_PROVIDER_SITE_OTHER): Payer: Medicare Other

## 2018-10-31 ENCOUNTER — Other Ambulatory Visit: Payer: Self-pay

## 2018-10-31 DIAGNOSIS — K922 Gastrointestinal hemorrhage, unspecified: Secondary | ICD-10-CM

## 2018-10-31 DIAGNOSIS — N2581 Secondary hyperparathyroidism of renal origin: Secondary | ICD-10-CM | POA: Diagnosis not present

## 2018-10-31 DIAGNOSIS — Z4901 Encounter for fitting and adjustment of extracorporeal dialysis catheter: Secondary | ICD-10-CM | POA: Diagnosis not present

## 2018-10-31 DIAGNOSIS — N186 End stage renal disease: Secondary | ICD-10-CM

## 2018-10-31 DIAGNOSIS — I120 Hypertensive chronic kidney disease with stage 5 chronic kidney disease or end stage renal disease: Secondary | ICD-10-CM | POA: Diagnosis not present

## 2018-10-31 DIAGNOSIS — E1121 Type 2 diabetes mellitus with diabetic nephropathy: Secondary | ICD-10-CM | POA: Diagnosis not present

## 2018-10-31 DIAGNOSIS — N185 Chronic kidney disease, stage 5: Secondary | ICD-10-CM | POA: Diagnosis not present

## 2018-10-31 DIAGNOSIS — D62 Acute posthemorrhagic anemia: Secondary | ICD-10-CM | POA: Diagnosis not present

## 2018-10-31 DIAGNOSIS — E1122 Type 2 diabetes mellitus with diabetic chronic kidney disease: Secondary | ICD-10-CM

## 2018-10-31 DIAGNOSIS — D689 Coagulation defect, unspecified: Secondary | ICD-10-CM | POA: Diagnosis not present

## 2018-11-02 ENCOUNTER — Other Ambulatory Visit: Payer: Self-pay | Admitting: Family Medicine

## 2018-11-02 DIAGNOSIS — N2581 Secondary hyperparathyroidism of renal origin: Secondary | ICD-10-CM | POA: Diagnosis not present

## 2018-11-02 DIAGNOSIS — N185 Chronic kidney disease, stage 5: Secondary | ICD-10-CM | POA: Diagnosis not present

## 2018-11-02 DIAGNOSIS — N186 End stage renal disease: Secondary | ICD-10-CM | POA: Diagnosis not present

## 2018-11-02 DIAGNOSIS — E1121 Type 2 diabetes mellitus with diabetic nephropathy: Secondary | ICD-10-CM | POA: Diagnosis not present

## 2018-11-02 DIAGNOSIS — D689 Coagulation defect, unspecified: Secondary | ICD-10-CM | POA: Diagnosis not present

## 2018-11-02 DIAGNOSIS — E1122 Type 2 diabetes mellitus with diabetic chronic kidney disease: Secondary | ICD-10-CM | POA: Diagnosis not present

## 2018-11-02 DIAGNOSIS — Z992 Dependence on renal dialysis: Secondary | ICD-10-CM | POA: Diagnosis not present

## 2018-11-02 DIAGNOSIS — Z4901 Encounter for fitting and adjustment of extracorporeal dialysis catheter: Secondary | ICD-10-CM | POA: Diagnosis not present

## 2018-11-02 MED ORDER — ACETAMINOPHEN 500 MG PO TABS: 1000.0000 mg | ORAL_TABLET | Freq: Three times a day (TID) | ORAL | 0 refills | Status: DC | PRN

## 2018-11-02 MED ORDER — FERRIC CITRATE 1 GM 210 MG(FE) PO TABS: 210.0000 mg | ORAL_TABLET | Freq: Three times a day (TID) | ORAL | 0 refills | Status: DC

## 2018-11-02 MED ORDER — MIDODRINE HCL 10 MG PO TABS: ORAL_TABLET | ORAL | 3 refills | Status: DC

## 2018-11-02 MED ORDER — DOCUSATE CALCIUM 240 MG PO CAPS: 240.0000 mg | ORAL_CAPSULE | Freq: Every day | ORAL | 1 refills | Status: DC

## 2018-11-05 DIAGNOSIS — D689 Coagulation defect, unspecified: Secondary | ICD-10-CM | POA: Diagnosis not present

## 2018-11-05 DIAGNOSIS — Z992 Dependence on renal dialysis: Secondary | ICD-10-CM | POA: Diagnosis not present

## 2018-11-05 DIAGNOSIS — N2581 Secondary hyperparathyroidism of renal origin: Secondary | ICD-10-CM | POA: Diagnosis not present

## 2018-11-05 DIAGNOSIS — N186 End stage renal disease: Secondary | ICD-10-CM | POA: Diagnosis not present

## 2018-11-05 DIAGNOSIS — Z4901 Encounter for fitting and adjustment of extracorporeal dialysis catheter: Secondary | ICD-10-CM | POA: Diagnosis not present

## 2018-11-05 DIAGNOSIS — D631 Anemia in chronic kidney disease: Secondary | ICD-10-CM | POA: Diagnosis not present

## 2018-11-07 DIAGNOSIS — D689 Coagulation defect, unspecified: Secondary | ICD-10-CM | POA: Diagnosis not present

## 2018-11-07 DIAGNOSIS — Z992 Dependence on renal dialysis: Secondary | ICD-10-CM | POA: Diagnosis not present

## 2018-11-07 DIAGNOSIS — D631 Anemia in chronic kidney disease: Secondary | ICD-10-CM | POA: Diagnosis not present

## 2018-11-07 DIAGNOSIS — N2581 Secondary hyperparathyroidism of renal origin: Secondary | ICD-10-CM | POA: Diagnosis not present

## 2018-11-07 DIAGNOSIS — N186 End stage renal disease: Secondary | ICD-10-CM | POA: Diagnosis not present

## 2018-11-07 DIAGNOSIS — Z4901 Encounter for fitting and adjustment of extracorporeal dialysis catheter: Secondary | ICD-10-CM | POA: Diagnosis not present

## 2018-11-08 ENCOUNTER — Other Ambulatory Visit: Payer: Self-pay

## 2018-11-09 ENCOUNTER — Encounter: Payer: Self-pay | Admitting: Family Medicine

## 2018-11-09 ENCOUNTER — Ambulatory Visit (INDEPENDENT_AMBULATORY_CARE_PROVIDER_SITE_OTHER): Payer: Medicare Other | Admitting: Family Medicine

## 2018-11-09 VITALS — BP 137/90 | HR 64 | Temp 98.6°F | Ht 65.0 in | Wt 172.0 lb

## 2018-11-09 DIAGNOSIS — Z4901 Encounter for fitting and adjustment of extracorporeal dialysis catheter: Secondary | ICD-10-CM | POA: Diagnosis not present

## 2018-11-09 DIAGNOSIS — K219 Gastro-esophageal reflux disease without esophagitis: Secondary | ICD-10-CM

## 2018-11-09 DIAGNOSIS — Z79899 Other long term (current) drug therapy: Secondary | ICD-10-CM | POA: Diagnosis not present

## 2018-11-09 DIAGNOSIS — M1990 Unspecified osteoarthritis, unspecified site: Secondary | ICD-10-CM | POA: Diagnosis not present

## 2018-11-09 DIAGNOSIS — M546 Pain in thoracic spine: Secondary | ICD-10-CM

## 2018-11-09 DIAGNOSIS — G2581 Restless legs syndrome: Secondary | ICD-10-CM | POA: Diagnosis not present

## 2018-11-09 DIAGNOSIS — Z23 Encounter for immunization: Secondary | ICD-10-CM

## 2018-11-09 DIAGNOSIS — G8929 Other chronic pain: Secondary | ICD-10-CM | POA: Diagnosis not present

## 2018-11-09 DIAGNOSIS — N2581 Secondary hyperparathyroidism of renal origin: Secondary | ICD-10-CM | POA: Diagnosis not present

## 2018-11-09 DIAGNOSIS — N186 End stage renal disease: Secondary | ICD-10-CM | POA: Diagnosis not present

## 2018-11-09 DIAGNOSIS — D689 Coagulation defect, unspecified: Secondary | ICD-10-CM | POA: Diagnosis not present

## 2018-11-09 DIAGNOSIS — D631 Anemia in chronic kidney disease: Secondary | ICD-10-CM | POA: Diagnosis not present

## 2018-11-09 DIAGNOSIS — Z66 Do not resuscitate: Secondary | ICD-10-CM | POA: Diagnosis not present

## 2018-11-09 DIAGNOSIS — Z992 Dependence on renal dialysis: Secondary | ICD-10-CM | POA: Diagnosis not present

## 2018-11-09 MED ORDER — HYDROCODONE-ACETAMINOPHEN 5-325 MG PO TABS: 1.0000 | ORAL_TABLET | Freq: Four times a day (QID) | ORAL | 0 refills | Status: DC | PRN

## 2018-11-09 NOTE — Progress Notes (Signed)
Assessment & Plan:  1-3. Arthritis/Chronic midline thoracic back pain/Controlled substance agreement signed - Controlled substance agreement signed today. 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  - Drug Screen 13 w/Conf, WB - HYDROcodone-acetaminophen (NORCO/VICODIN) 5-325 MG tablet; Take 1 tablet by mouth every 6 (six) hours as needed for moderate pain.  Dispense: 30 tablet; Refill: 0 - HYDROcodone-acetaminophen (NORCO/VICODIN) 5-325 MG tablet; Take 1 tablet by mouth every 6 (six) hours as needed for moderate pain.  Dispense: 30 tablet; Refill: 0  4. DNR (do not resuscitate) - Patient does keep original DNR visible in her home and keeps one with her at all times.  - DNR (Do Not Resuscitate)  5. Restless leg syndrome - Improved significantly with addition of Mirapex. She is sleeping great and not feeling bad the next morning like she was with the gabapentin.  - Ferritin  6. Gastroesophageal reflux disease without esophagitis - Improved greatly with addition of famotidine.   7. Need for tetanus booster - Tdap vaccine greater than or equal to 7yo IM   Follow up plan: Return in about 3 months (around 02/09/2019) for follow-up of chronic medication conditions.  [name], MSN, APRN, FNP-C [facility]  Subjective:   Patient ID: [name], female    DOB: Sep 12, 1940, 78 y.o.   MRN: [id number]  HPI: [name] is a 78 y.o. female presenting on 11/09/2018 for controlled med aggreement (signed )  [place] reviewed- Yes If yes- were their any concerning findings : No  Depression screen [facility] 2/9 11/09/2018 10/26/2018 10/26/2018  Decreased Interest 0 0 0  Down, Depressed, Hopeless 0 0 0  PHQ - 2 Score 0 0 0  Altered sleeping - 1 -  Tired, decreased energy - 1 -  Change in appetite - 0 -  Feeling bad or failure about yourself  - 2 -  Trouble concentrating - 0 -  Moving slowly or  fidgety/restless - 0 -  Suicidal thoughts - 0 -  PHQ-9 Score - 4 -  Difficult doing work/chores - Not difficult at all -    GAD 7 : Generalized Anxiety Score 10/26/2018  Nervous, Anxious, on Edge 0  Control/stop worrying 0  Worry too much - different things 0  Trouble relaxing 1  Restless 2  Easily annoyed or irritable 2  Afraid - awful might happen 0  Total GAD 7 Score 5  Anxiety Difficulty Not difficult at all    [place] drug screen performed- Yes  SOAPP  0= never  1= seldom  2=sometimes  3= often  4= very often  How often do you have mood swings? 1 How often do you smoke a cigarette within an hour after waking up? 0 How often have you taken medication other than the way that it was prescribed? 0 How often have you used illegal drugs in the past 5 years? 0 How often, in your lifetime, have you had legal problems or been arrested? 0  Score 1  Alcohol Audit - How often during the last year have found that you: 0-Never   1- Less than monthly   2- Monthly     3-Weekly     4-daily or almost daily  - found that you were not able to stop drinking once you started- 0 - failed to do what was normally expected of you because of drinking- 0 - needed a first drink in the morning- 0 -  had a feeling of guilt or remorse after drinking- 0 - are/were unable to remember what happened the night before because of your drinking- 0  0- NO   2- yes but not in last year  4- yes during last year - Have you or someone else been injured because of your drinking- 0 - Has anyone been concerned about your drinking or suggested you cut down- 0        TOTAL- 0  ( 0-7- alcohol education, 8-15- simple advice, 16-19 simple advice plus counseling, 20-40 referral for evaluation and treatment )   Designated Pharmacy- [facility]  Pain assessment: Cause of pain- arthritis Pain location- back and knees Pain on scale of 1-10- 6/10 Frequency- daily What increases pain- decreased activity, heat,  rubs What makes pain better- Norco, stretching, massage Effects on ADL - able to tolerate to complete ADLs  Prior treatments tried and failed- unable to take NSAIDs due to kidney function, Tylenol, Tramadol Current opioids rx- Norco 5/325 PO Q6H PRN # prescribed- 30 Morphine mg equivalent- 5 MME/day  Pain management agreement reviewed and signed- Yes    ROS: Negative unless specifically indicated above in HPI.   Relevant past medical history reviewed and updated as indicated.   Allergies and medications reviewed and updated.   Current Outpatient Medications:  .  acetaminophen (TYLENOL) 500 MG tablet, Take 2 tablets (1,000 mg total) by mouth every 8 (eight) hours as needed for mild pain or headache., Disp: 30 tablet, Rfl: 0 .  albuterol (VENTOLIN HFA) 108 (90 Base) MCG/ACT inhaler, Inhale 2 puffs into the lungs every 6 (six) hours as needed for wheezing or shortness of breath., Disp: 18 g, Rfl: 2 .  alendronate (FOSAMAX) 70 MG tablet, Take 1 tablet (70 mg total) by mouth every Thursday., Disp: 12 tablet, Rfl: 3 .  aspirin EC 81 MG tablet, Take 81 mg by mouth daily., Disp: , Rfl:  .  budesonide-formoterol (SYMBICORT) 80-4.5 MCG/ACT inhaler, Inhale 2 puffs into the lungs 2 (two) times daily., Disp: 3 Inhaler, Rfl: 4 .  cinacalcet (SENSIPAR) 30 MG tablet, Take 1 tablet (30 mg total) by mouth every Monday, Wednesday, and Friday., Disp: 60 tablet, Rfl: 0 .  citalopram (CELEXA) 40 MG tablet, Take 1 tablet (40 mg total) by mouth daily., Disp: 90 tablet, Rfl: 1 .  colchicine 0.6 MG tablet, Take 2 tablets by mouth at onset of gout flare and then 1 tablet 1 hour later. Use as needed., Disp: 15 tablet, Rfl: 1 .  docusate calcium (SURFAK) 240 MG capsule, Take 1 capsule (240 mg total) by mouth daily., Disp: 90 capsule, Rfl: 1 .  doxercalciferol (HECTOROL) 4 MCG/2ML injection, Inject 0.5 mLs (1 mcg total) into the vein every Monday, Wednesday, and Friday with hemodialysis., Disp: 2 mL, Rfl: 0 .   ezetimibe (ZETIA) 10 MG tablet, Take 1 tablet (10 mg total) by mouth daily., Disp: 90 tablet, Rfl: 3 .  famotidine (PEPCID) 20 MG tablet, Take 1 tablet (20 mg total) by mouth 2 (two) times daily., Disp: 60 tablet, Rfl: 2 .  ferric citrate (AURYXIA) 1 GM 210 MG(Fe) tablet, Take 1 tablet (210 mg total) by mouth 3 (three) times daily before meals., Disp: 270 tablet, Rfl: 0 .  HYDROcodone-acetaminophen (NORCO/VICODIN) 5-325 MG tablet, Take 1 tablet by mouth every 6 (six) hours as needed for moderate pain., Disp: 30 tablet, Rfl: 0 .  levothyroxine (SYNTHROID) 100 MCG tablet, Take 1 tablet (100 mcg total) by mouth daily., Disp: 90 tablet, Rfl: 3 .  LORazepam (ATIVAN) 0.5 MG tablet, Take 1 tablet (0.5 mg total) by mouth every other day., Disp: 15 tablet, Rfl: 0 .  midodrine (PROAMATINE) 10 MG tablet, Take 1 tablet (10 mg) by mouth once daily as needed on dialysis days (Monday, Wednesday and Friday), Disp: 180 tablet, Rfl: 3 .  Multiple Vitamin (MULTIVITAMIN) tablet, Take 1 tablet by mouth daily., Disp: , Rfl:  .  multivitamin (RENA-VIT) TABS tablet, Take 1 tablet by mouth daily., Disp: , Rfl:  .  nystatin cream (MYCOSTATIN), Apply 1 application topically 2 (two) times daily as needed for dry skin., Disp: 30 g, Rfl: 0 .  OXYGEN, Inhale 3 L into the lungs continuous., Disp: , Rfl:  .  pantoprazole (PROTONIX) 40 MG tablet, Take 1 tablet (40 mg total) by mouth 2 (two) times daily., Disp: 180 tablet, Rfl: 3 .  pramipexole (MIRAPEX) 0.125 MG tablet, Take 1 tablet (0.125 mg total) by mouth at bedtime., Disp: 30 tablet, Rfl: 2 .  Vitamin D, Ergocalciferol, (DRISDOL) 1.25 MG (50000 UT) CAPS capsule, Take 1 capsule (50,000 Units total) by mouth every Monday., Disp: 12 capsule, Rfl: 3 .  HYDROcodone-acetaminophen (NORCO/VICODIN) 5-325 MG tablet, Take 1 tablet by mouth every 6 (six) hours as needed for moderate pain., Disp: 30 tablet, Rfl: 0  Allergies  Allergen Reactions  . Amoxicillin Rash    Has patient had a PCN  reaction causing immediate rash, facial/tongue/throat swelling, SOB or lightheadedness with hypotension:YES Has patient had a PCN reaction causing severe rash involving mucus membranes or skin necrosis: Yes Has patient had a PCN reaction that required hospitalization No Has patient had a PCN reaction occurring within the last 10 years: Yes If all of the above answers are "NO", then may proceed with Cephalosporin use.   . Sulfa Drugs Cross Reactors Hives and Itching  . Percocet [Oxycodone-Acetaminophen] Itching and Rash    Did not happen last time she took it  . Sulfa Antibiotics Hives    Objective:   BP 137/90   Pulse 64   Temp 98.6 F (37 C)   Ht 5\' 5"  (1.651 m)   Wt 172 lb (78 kg)   BMI 28.62 kg/m    Physical Exam Vitals signs reviewed.  Constitutional:      General: She is not in acute distress.    Appearance: Normal appearance. She is overweight. She is not ill-appearing, toxic-appearing or diaphoretic.  HENT:     Head: Normocephalic and atraumatic.  Eyes:     General: No scleral icterus.       Right eye: No discharge.        Left eye: No discharge.     Conjunctiva/sclera: Conjunctivae normal.  Neck:     Musculoskeletal: Normal range of motion.  Cardiovascular:     Rate and Rhythm: Normal rate and regular rhythm.     Heart sounds: Normal heart sounds. No murmur. No friction rub. No gallop.   Pulmonary:     Effort: Pulmonary effort is normal. No respiratory distress.     Breath sounds: Normal breath sounds. No stridor. No wheezing, rhonchi or rales.  Musculoskeletal: Normal range of motion.  Skin:    General: Skin is warm and dry.     Capillary Refill: Capillary refill takes less than 2 seconds.  Neurological:     General: No focal deficit present.     Mental Status: She is alert and oriented to person, place, and time. Mental status is at baseline.     Gait: Gait abnormal (ambulates  with walker).  Psychiatric:        Mood and Affect: Mood normal.         Behavior: Behavior normal.        Thought Content: Thought content normal.        Judgment: Judgment normal.

## 2018-11-09 NOTE — Patient Instructions (Signed)
Voltaren gel for knees.

## 2018-11-10 LAB — FERRITIN: Ferritin: 1239 ng/mL — ABNORMAL HIGH (ref 15–150)

## 2018-11-12 DIAGNOSIS — N2581 Secondary hyperparathyroidism of renal origin: Secondary | ICD-10-CM | POA: Diagnosis not present

## 2018-11-12 DIAGNOSIS — N186 End stage renal disease: Secondary | ICD-10-CM | POA: Diagnosis not present

## 2018-11-12 DIAGNOSIS — Z992 Dependence on renal dialysis: Secondary | ICD-10-CM | POA: Diagnosis not present

## 2018-11-12 DIAGNOSIS — D631 Anemia in chronic kidney disease: Secondary | ICD-10-CM | POA: Diagnosis not present

## 2018-11-12 DIAGNOSIS — Z4901 Encounter for fitting and adjustment of extracorporeal dialysis catheter: Secondary | ICD-10-CM | POA: Diagnosis not present

## 2018-11-12 DIAGNOSIS — D689 Coagulation defect, unspecified: Secondary | ICD-10-CM | POA: Diagnosis not present

## 2018-11-13 DIAGNOSIS — S46122D Laceration of muscle, fascia and tendon of long head of biceps, left arm, subsequent encounter: Secondary | ICD-10-CM | POA: Diagnosis not present

## 2018-11-14 DIAGNOSIS — N2581 Secondary hyperparathyroidism of renal origin: Secondary | ICD-10-CM | POA: Diagnosis not present

## 2018-11-14 DIAGNOSIS — D689 Coagulation defect, unspecified: Secondary | ICD-10-CM | POA: Diagnosis not present

## 2018-11-14 DIAGNOSIS — Z4901 Encounter for fitting and adjustment of extracorporeal dialysis catheter: Secondary | ICD-10-CM | POA: Diagnosis not present

## 2018-11-14 DIAGNOSIS — Z992 Dependence on renal dialysis: Secondary | ICD-10-CM | POA: Diagnosis not present

## 2018-11-14 DIAGNOSIS — N186 End stage renal disease: Secondary | ICD-10-CM | POA: Diagnosis not present

## 2018-11-14 DIAGNOSIS — D631 Anemia in chronic kidney disease: Secondary | ICD-10-CM | POA: Diagnosis not present

## 2018-11-15 LAB — DRUG SCREEN 13 W/CONF, WB
Amphetamines, IA: NEGATIVE ng/mL
Barbiturates, IA: NEGATIVE ug/mL
Benzodiazepines, IA: NEGATIVE ng/mL
Cocaine/Metabolite, IA: NEGATIVE ng/mL
Fentanyl, IA: NEGATIVE ng/mL
Meperidine, IA: NEGATIVE ng/mL
Methadone, IA: NEGATIVE ng/mL
Opiates, IA: NEGATIVE ng/mL
Oxycodones, IA: NEGATIVE ng/mL
Phencyclidine, IA: NEGATIVE ng/mL
Propoxyphene, IA: NEGATIVE ng/mL
THC (Marijuana) Mtb, IA: NEGATIVE ng/mL
Tramadol, IA: NEGATIVE ng/mL

## 2018-11-16 DIAGNOSIS — D689 Coagulation defect, unspecified: Secondary | ICD-10-CM | POA: Diagnosis not present

## 2018-11-16 DIAGNOSIS — Z4901 Encounter for fitting and adjustment of extracorporeal dialysis catheter: Secondary | ICD-10-CM | POA: Diagnosis not present

## 2018-11-16 DIAGNOSIS — N2581 Secondary hyperparathyroidism of renal origin: Secondary | ICD-10-CM | POA: Diagnosis not present

## 2018-11-16 DIAGNOSIS — N186 End stage renal disease: Secondary | ICD-10-CM | POA: Diagnosis not present

## 2018-11-16 DIAGNOSIS — Z992 Dependence on renal dialysis: Secondary | ICD-10-CM | POA: Diagnosis not present

## 2018-11-16 DIAGNOSIS — D631 Anemia in chronic kidney disease: Secondary | ICD-10-CM | POA: Diagnosis not present

## 2018-11-19 DIAGNOSIS — Z992 Dependence on renal dialysis: Secondary | ICD-10-CM | POA: Diagnosis not present

## 2018-11-19 DIAGNOSIS — N186 End stage renal disease: Secondary | ICD-10-CM | POA: Diagnosis not present

## 2018-11-19 DIAGNOSIS — D689 Coagulation defect, unspecified: Secondary | ICD-10-CM | POA: Diagnosis not present

## 2018-11-19 DIAGNOSIS — D631 Anemia in chronic kidney disease: Secondary | ICD-10-CM | POA: Diagnosis not present

## 2018-11-19 DIAGNOSIS — Z4901 Encounter for fitting and adjustment of extracorporeal dialysis catheter: Secondary | ICD-10-CM | POA: Diagnosis not present

## 2018-11-19 DIAGNOSIS — N2581 Secondary hyperparathyroidism of renal origin: Secondary | ICD-10-CM | POA: Diagnosis not present

## 2018-11-21 DIAGNOSIS — Z4901 Encounter for fitting and adjustment of extracorporeal dialysis catheter: Secondary | ICD-10-CM | POA: Diagnosis not present

## 2018-11-21 DIAGNOSIS — N2581 Secondary hyperparathyroidism of renal origin: Secondary | ICD-10-CM | POA: Diagnosis not present

## 2018-11-21 DIAGNOSIS — D689 Coagulation defect, unspecified: Secondary | ICD-10-CM | POA: Diagnosis not present

## 2018-11-21 DIAGNOSIS — N186 End stage renal disease: Secondary | ICD-10-CM | POA: Diagnosis not present

## 2018-11-21 DIAGNOSIS — Z992 Dependence on renal dialysis: Secondary | ICD-10-CM | POA: Diagnosis not present

## 2018-11-21 DIAGNOSIS — D631 Anemia in chronic kidney disease: Secondary | ICD-10-CM | POA: Diagnosis not present

## 2018-11-23 DIAGNOSIS — D631 Anemia in chronic kidney disease: Secondary | ICD-10-CM | POA: Diagnosis not present

## 2018-11-23 DIAGNOSIS — D689 Coagulation defect, unspecified: Secondary | ICD-10-CM | POA: Diagnosis not present

## 2018-11-23 DIAGNOSIS — Z992 Dependence on renal dialysis: Secondary | ICD-10-CM | POA: Diagnosis not present

## 2018-11-23 DIAGNOSIS — N2581 Secondary hyperparathyroidism of renal origin: Secondary | ICD-10-CM | POA: Diagnosis not present

## 2018-11-23 DIAGNOSIS — N186 End stage renal disease: Secondary | ICD-10-CM | POA: Diagnosis not present

## 2018-11-23 DIAGNOSIS — Z4901 Encounter for fitting and adjustment of extracorporeal dialysis catheter: Secondary | ICD-10-CM | POA: Diagnosis not present

## 2018-11-26 ENCOUNTER — Telehealth: Payer: Self-pay | Admitting: Family Medicine

## 2018-11-26 DIAGNOSIS — Z4901 Encounter for fitting and adjustment of extracorporeal dialysis catheter: Secondary | ICD-10-CM | POA: Diagnosis not present

## 2018-11-26 DIAGNOSIS — N2581 Secondary hyperparathyroidism of renal origin: Secondary | ICD-10-CM | POA: Diagnosis not present

## 2018-11-26 DIAGNOSIS — D631 Anemia in chronic kidney disease: Secondary | ICD-10-CM | POA: Diagnosis not present

## 2018-11-26 DIAGNOSIS — Z992 Dependence on renal dialysis: Secondary | ICD-10-CM | POA: Diagnosis not present

## 2018-11-26 DIAGNOSIS — D689 Coagulation defect, unspecified: Secondary | ICD-10-CM | POA: Diagnosis not present

## 2018-11-26 DIAGNOSIS — N186 End stage renal disease: Secondary | ICD-10-CM | POA: Diagnosis not present

## 2018-11-26 NOTE — Telephone Encounter (Signed)
Patient requesting lab results

## 2018-11-26 NOTE — Telephone Encounter (Signed)
The only labs we have done recently are a ferritin to make sure it was not low (and it wasn't) and a drug screen.

## 2018-11-26 NOTE — Telephone Encounter (Signed)
Aware of results. 

## 2018-11-28 DIAGNOSIS — Z992 Dependence on renal dialysis: Secondary | ICD-10-CM | POA: Diagnosis not present

## 2018-11-28 DIAGNOSIS — D689 Coagulation defect, unspecified: Secondary | ICD-10-CM | POA: Diagnosis not present

## 2018-11-28 DIAGNOSIS — N186 End stage renal disease: Secondary | ICD-10-CM | POA: Diagnosis not present

## 2018-11-28 DIAGNOSIS — N2581 Secondary hyperparathyroidism of renal origin: Secondary | ICD-10-CM | POA: Diagnosis not present

## 2018-11-28 DIAGNOSIS — Z4901 Encounter for fitting and adjustment of extracorporeal dialysis catheter: Secondary | ICD-10-CM | POA: Diagnosis not present

## 2018-11-28 DIAGNOSIS — D631 Anemia in chronic kidney disease: Secondary | ICD-10-CM | POA: Diagnosis not present

## 2018-11-28 DIAGNOSIS — I480 Paroxysmal atrial fibrillation: Secondary | ICD-10-CM | POA: Diagnosis not present

## 2018-11-30 DIAGNOSIS — N186 End stage renal disease: Secondary | ICD-10-CM | POA: Diagnosis not present

## 2018-11-30 DIAGNOSIS — Z4901 Encounter for fitting and adjustment of extracorporeal dialysis catheter: Secondary | ICD-10-CM | POA: Diagnosis not present

## 2018-11-30 DIAGNOSIS — N2581 Secondary hyperparathyroidism of renal origin: Secondary | ICD-10-CM | POA: Diagnosis not present

## 2018-11-30 DIAGNOSIS — D689 Coagulation defect, unspecified: Secondary | ICD-10-CM | POA: Diagnosis not present

## 2018-11-30 DIAGNOSIS — D631 Anemia in chronic kidney disease: Secondary | ICD-10-CM | POA: Diagnosis not present

## 2018-11-30 DIAGNOSIS — Z992 Dependence on renal dialysis: Secondary | ICD-10-CM | POA: Diagnosis not present

## 2018-12-01 DIAGNOSIS — E1169 Type 2 diabetes mellitus with other specified complication: Secondary | ICD-10-CM | POA: Diagnosis not present

## 2018-12-03 DIAGNOSIS — Z992 Dependence on renal dialysis: Secondary | ICD-10-CM | POA: Diagnosis not present

## 2018-12-03 DIAGNOSIS — N186 End stage renal disease: Secondary | ICD-10-CM | POA: Diagnosis not present

## 2018-12-03 DIAGNOSIS — D631 Anemia in chronic kidney disease: Secondary | ICD-10-CM | POA: Diagnosis not present

## 2018-12-03 DIAGNOSIS — E1122 Type 2 diabetes mellitus with diabetic chronic kidney disease: Secondary | ICD-10-CM | POA: Diagnosis not present

## 2018-12-03 DIAGNOSIS — D689 Coagulation defect, unspecified: Secondary | ICD-10-CM | POA: Diagnosis not present

## 2018-12-03 DIAGNOSIS — N2581 Secondary hyperparathyroidism of renal origin: Secondary | ICD-10-CM | POA: Diagnosis not present

## 2018-12-03 DIAGNOSIS — Z4901 Encounter for fitting and adjustment of extracorporeal dialysis catheter: Secondary | ICD-10-CM | POA: Diagnosis not present

## 2018-12-05 DIAGNOSIS — Z992 Dependence on renal dialysis: Secondary | ICD-10-CM | POA: Diagnosis not present

## 2018-12-05 DIAGNOSIS — Z23 Encounter for immunization: Secondary | ICD-10-CM | POA: Diagnosis not present

## 2018-12-05 DIAGNOSIS — D689 Coagulation defect, unspecified: Secondary | ICD-10-CM | POA: Diagnosis not present

## 2018-12-05 DIAGNOSIS — N186 End stage renal disease: Secondary | ICD-10-CM | POA: Diagnosis not present

## 2018-12-05 DIAGNOSIS — Z4901 Encounter for fitting and adjustment of extracorporeal dialysis catheter: Secondary | ICD-10-CM | POA: Diagnosis not present

## 2018-12-05 DIAGNOSIS — D631 Anemia in chronic kidney disease: Secondary | ICD-10-CM | POA: Diagnosis not present

## 2018-12-05 DIAGNOSIS — N2581 Secondary hyperparathyroidism of renal origin: Secondary | ICD-10-CM | POA: Diagnosis not present

## 2018-12-06 ENCOUNTER — Ambulatory Visit: Payer: Self-pay | Admitting: Family Medicine

## 2018-12-07 DIAGNOSIS — D631 Anemia in chronic kidney disease: Secondary | ICD-10-CM | POA: Diagnosis not present

## 2018-12-07 DIAGNOSIS — N2581 Secondary hyperparathyroidism of renal origin: Secondary | ICD-10-CM | POA: Diagnosis not present

## 2018-12-07 DIAGNOSIS — Z992 Dependence on renal dialysis: Secondary | ICD-10-CM | POA: Diagnosis not present

## 2018-12-07 DIAGNOSIS — Z23 Encounter for immunization: Secondary | ICD-10-CM | POA: Diagnosis not present

## 2018-12-07 DIAGNOSIS — N186 End stage renal disease: Secondary | ICD-10-CM | POA: Diagnosis not present

## 2018-12-07 DIAGNOSIS — D689 Coagulation defect, unspecified: Secondary | ICD-10-CM | POA: Diagnosis not present

## 2018-12-07 DIAGNOSIS — Z4901 Encounter for fitting and adjustment of extracorporeal dialysis catheter: Secondary | ICD-10-CM | POA: Diagnosis not present

## 2018-12-10 DIAGNOSIS — N2581 Secondary hyperparathyroidism of renal origin: Secondary | ICD-10-CM | POA: Diagnosis not present

## 2018-12-10 DIAGNOSIS — D631 Anemia in chronic kidney disease: Secondary | ICD-10-CM | POA: Diagnosis not present

## 2018-12-10 DIAGNOSIS — Z992 Dependence on renal dialysis: Secondary | ICD-10-CM | POA: Diagnosis not present

## 2018-12-10 DIAGNOSIS — Z4901 Encounter for fitting and adjustment of extracorporeal dialysis catheter: Secondary | ICD-10-CM | POA: Diagnosis not present

## 2018-12-10 DIAGNOSIS — N186 End stage renal disease: Secondary | ICD-10-CM | POA: Diagnosis not present

## 2018-12-10 DIAGNOSIS — D689 Coagulation defect, unspecified: Secondary | ICD-10-CM | POA: Diagnosis not present

## 2018-12-10 DIAGNOSIS — Z23 Encounter for immunization: Secondary | ICD-10-CM | POA: Diagnosis not present

## 2018-12-12 DIAGNOSIS — N186 End stage renal disease: Secondary | ICD-10-CM | POA: Diagnosis not present

## 2018-12-12 DIAGNOSIS — Z23 Encounter for immunization: Secondary | ICD-10-CM | POA: Diagnosis not present

## 2018-12-12 DIAGNOSIS — D689 Coagulation defect, unspecified: Secondary | ICD-10-CM | POA: Diagnosis not present

## 2018-12-12 DIAGNOSIS — N2581 Secondary hyperparathyroidism of renal origin: Secondary | ICD-10-CM | POA: Diagnosis not present

## 2018-12-12 DIAGNOSIS — Z992 Dependence on renal dialysis: Secondary | ICD-10-CM | POA: Diagnosis not present

## 2018-12-12 DIAGNOSIS — D631 Anemia in chronic kidney disease: Secondary | ICD-10-CM | POA: Diagnosis not present

## 2018-12-12 DIAGNOSIS — Z4901 Encounter for fitting and adjustment of extracorporeal dialysis catheter: Secondary | ICD-10-CM | POA: Diagnosis not present

## 2018-12-14 DIAGNOSIS — D631 Anemia in chronic kidney disease: Secondary | ICD-10-CM | POA: Diagnosis not present

## 2018-12-14 DIAGNOSIS — Z23 Encounter for immunization: Secondary | ICD-10-CM | POA: Diagnosis not present

## 2018-12-14 DIAGNOSIS — N2581 Secondary hyperparathyroidism of renal origin: Secondary | ICD-10-CM | POA: Diagnosis not present

## 2018-12-14 DIAGNOSIS — Z992 Dependence on renal dialysis: Secondary | ICD-10-CM | POA: Diagnosis not present

## 2018-12-14 DIAGNOSIS — D689 Coagulation defect, unspecified: Secondary | ICD-10-CM | POA: Diagnosis not present

## 2018-12-14 DIAGNOSIS — Z4901 Encounter for fitting and adjustment of extracorporeal dialysis catheter: Secondary | ICD-10-CM | POA: Diagnosis not present

## 2018-12-14 DIAGNOSIS — N186 End stage renal disease: Secondary | ICD-10-CM | POA: Diagnosis not present

## 2018-12-17 DIAGNOSIS — Z23 Encounter for immunization: Secondary | ICD-10-CM | POA: Diagnosis not present

## 2018-12-17 DIAGNOSIS — Z992 Dependence on renal dialysis: Secondary | ICD-10-CM | POA: Diagnosis not present

## 2018-12-17 DIAGNOSIS — N2581 Secondary hyperparathyroidism of renal origin: Secondary | ICD-10-CM | POA: Diagnosis not present

## 2018-12-17 DIAGNOSIS — N186 End stage renal disease: Secondary | ICD-10-CM | POA: Diagnosis not present

## 2018-12-17 DIAGNOSIS — D631 Anemia in chronic kidney disease: Secondary | ICD-10-CM | POA: Diagnosis not present

## 2018-12-17 DIAGNOSIS — Z4901 Encounter for fitting and adjustment of extracorporeal dialysis catheter: Secondary | ICD-10-CM | POA: Diagnosis not present

## 2018-12-17 DIAGNOSIS — D689 Coagulation defect, unspecified: Secondary | ICD-10-CM | POA: Diagnosis not present

## 2018-12-18 DIAGNOSIS — I70203 Unspecified atherosclerosis of native arteries of extremities, bilateral legs: Secondary | ICD-10-CM | POA: Diagnosis not present

## 2018-12-18 DIAGNOSIS — L84 Corns and callosities: Secondary | ICD-10-CM | POA: Diagnosis not present

## 2018-12-18 DIAGNOSIS — M79676 Pain in unspecified toe(s): Secondary | ICD-10-CM | POA: Diagnosis not present

## 2018-12-18 DIAGNOSIS — B351 Tinea unguium: Secondary | ICD-10-CM | POA: Diagnosis not present

## 2018-12-19 DIAGNOSIS — D631 Anemia in chronic kidney disease: Secondary | ICD-10-CM | POA: Diagnosis not present

## 2018-12-19 DIAGNOSIS — D689 Coagulation defect, unspecified: Secondary | ICD-10-CM | POA: Diagnosis not present

## 2018-12-19 DIAGNOSIS — Z23 Encounter for immunization: Secondary | ICD-10-CM | POA: Diagnosis not present

## 2018-12-19 DIAGNOSIS — Z992 Dependence on renal dialysis: Secondary | ICD-10-CM | POA: Diagnosis not present

## 2018-12-19 DIAGNOSIS — N2581 Secondary hyperparathyroidism of renal origin: Secondary | ICD-10-CM | POA: Diagnosis not present

## 2018-12-19 DIAGNOSIS — Z4901 Encounter for fitting and adjustment of extracorporeal dialysis catheter: Secondary | ICD-10-CM | POA: Diagnosis not present

## 2018-12-19 DIAGNOSIS — N186 End stage renal disease: Secondary | ICD-10-CM | POA: Diagnosis not present

## 2018-12-21 DIAGNOSIS — Z23 Encounter for immunization: Secondary | ICD-10-CM | POA: Diagnosis not present

## 2018-12-21 DIAGNOSIS — Z4901 Encounter for fitting and adjustment of extracorporeal dialysis catheter: Secondary | ICD-10-CM | POA: Diagnosis not present

## 2018-12-21 DIAGNOSIS — Z992 Dependence on renal dialysis: Secondary | ICD-10-CM | POA: Diagnosis not present

## 2018-12-21 DIAGNOSIS — N186 End stage renal disease: Secondary | ICD-10-CM | POA: Diagnosis not present

## 2018-12-21 DIAGNOSIS — D631 Anemia in chronic kidney disease: Secondary | ICD-10-CM | POA: Diagnosis not present

## 2018-12-21 DIAGNOSIS — D689 Coagulation defect, unspecified: Secondary | ICD-10-CM | POA: Diagnosis not present

## 2018-12-21 DIAGNOSIS — N2581 Secondary hyperparathyroidism of renal origin: Secondary | ICD-10-CM | POA: Diagnosis not present

## 2018-12-24 DIAGNOSIS — D689 Coagulation defect, unspecified: Secondary | ICD-10-CM | POA: Diagnosis not present

## 2018-12-24 DIAGNOSIS — N186 End stage renal disease: Secondary | ICD-10-CM | POA: Diagnosis not present

## 2018-12-24 DIAGNOSIS — Z23 Encounter for immunization: Secondary | ICD-10-CM | POA: Diagnosis not present

## 2018-12-24 DIAGNOSIS — Z4901 Encounter for fitting and adjustment of extracorporeal dialysis catheter: Secondary | ICD-10-CM | POA: Diagnosis not present

## 2018-12-24 DIAGNOSIS — Z992 Dependence on renal dialysis: Secondary | ICD-10-CM | POA: Diagnosis not present

## 2018-12-24 DIAGNOSIS — J449 Chronic obstructive pulmonary disease, unspecified: Secondary | ICD-10-CM | POA: Diagnosis not present

## 2018-12-24 DIAGNOSIS — D631 Anemia in chronic kidney disease: Secondary | ICD-10-CM | POA: Diagnosis not present

## 2018-12-24 DIAGNOSIS — N2581 Secondary hyperparathyroidism of renal origin: Secondary | ICD-10-CM | POA: Diagnosis not present

## 2018-12-26 DIAGNOSIS — Z4901 Encounter for fitting and adjustment of extracorporeal dialysis catheter: Secondary | ICD-10-CM | POA: Diagnosis not present

## 2018-12-26 DIAGNOSIS — N2581 Secondary hyperparathyroidism of renal origin: Secondary | ICD-10-CM | POA: Diagnosis not present

## 2018-12-26 DIAGNOSIS — Z992 Dependence on renal dialysis: Secondary | ICD-10-CM | POA: Diagnosis not present

## 2018-12-26 DIAGNOSIS — D689 Coagulation defect, unspecified: Secondary | ICD-10-CM | POA: Diagnosis not present

## 2018-12-26 DIAGNOSIS — D631 Anemia in chronic kidney disease: Secondary | ICD-10-CM | POA: Diagnosis not present

## 2018-12-26 DIAGNOSIS — Z23 Encounter for immunization: Secondary | ICD-10-CM | POA: Diagnosis not present

## 2018-12-26 DIAGNOSIS — N186 End stage renal disease: Secondary | ICD-10-CM | POA: Diagnosis not present

## 2018-12-28 DIAGNOSIS — D631 Anemia in chronic kidney disease: Secondary | ICD-10-CM | POA: Diagnosis not present

## 2018-12-28 DIAGNOSIS — N186 End stage renal disease: Secondary | ICD-10-CM | POA: Diagnosis not present

## 2018-12-28 DIAGNOSIS — D689 Coagulation defect, unspecified: Secondary | ICD-10-CM | POA: Diagnosis not present

## 2018-12-28 DIAGNOSIS — Z4901 Encounter for fitting and adjustment of extracorporeal dialysis catheter: Secondary | ICD-10-CM | POA: Diagnosis not present

## 2018-12-28 DIAGNOSIS — N2581 Secondary hyperparathyroidism of renal origin: Secondary | ICD-10-CM | POA: Diagnosis not present

## 2018-12-28 DIAGNOSIS — Z23 Encounter for immunization: Secondary | ICD-10-CM | POA: Diagnosis not present

## 2018-12-28 DIAGNOSIS — Z992 Dependence on renal dialysis: Secondary | ICD-10-CM | POA: Diagnosis not present

## 2018-12-29 DIAGNOSIS — I480 Paroxysmal atrial fibrillation: Secondary | ICD-10-CM | POA: Diagnosis not present

## 2018-12-31 DIAGNOSIS — Z992 Dependence on renal dialysis: Secondary | ICD-10-CM | POA: Diagnosis not present

## 2018-12-31 DIAGNOSIS — D631 Anemia in chronic kidney disease: Secondary | ICD-10-CM | POA: Diagnosis not present

## 2018-12-31 DIAGNOSIS — Z23 Encounter for immunization: Secondary | ICD-10-CM | POA: Diagnosis not present

## 2018-12-31 DIAGNOSIS — Z4901 Encounter for fitting and adjustment of extracorporeal dialysis catheter: Secondary | ICD-10-CM | POA: Diagnosis not present

## 2018-12-31 DIAGNOSIS — N186 End stage renal disease: Secondary | ICD-10-CM | POA: Diagnosis not present

## 2018-12-31 DIAGNOSIS — N2581 Secondary hyperparathyroidism of renal origin: Secondary | ICD-10-CM | POA: Diagnosis not present

## 2018-12-31 DIAGNOSIS — D689 Coagulation defect, unspecified: Secondary | ICD-10-CM | POA: Diagnosis not present

## 2019-01-02 DIAGNOSIS — Z4901 Encounter for fitting and adjustment of extracorporeal dialysis catheter: Secondary | ICD-10-CM | POA: Diagnosis not present

## 2019-01-02 DIAGNOSIS — D631 Anemia in chronic kidney disease: Secondary | ICD-10-CM | POA: Diagnosis not present

## 2019-01-02 DIAGNOSIS — N2581 Secondary hyperparathyroidism of renal origin: Secondary | ICD-10-CM | POA: Diagnosis not present

## 2019-01-02 DIAGNOSIS — Z23 Encounter for immunization: Secondary | ICD-10-CM | POA: Diagnosis not present

## 2019-01-02 DIAGNOSIS — N186 End stage renal disease: Secondary | ICD-10-CM | POA: Diagnosis not present

## 2019-01-02 DIAGNOSIS — E1122 Type 2 diabetes mellitus with diabetic chronic kidney disease: Secondary | ICD-10-CM | POA: Diagnosis not present

## 2019-01-02 DIAGNOSIS — D689 Coagulation defect, unspecified: Secondary | ICD-10-CM | POA: Diagnosis not present

## 2019-01-02 DIAGNOSIS — Z992 Dependence on renal dialysis: Secondary | ICD-10-CM | POA: Diagnosis not present

## 2019-01-04 DIAGNOSIS — D689 Coagulation defect, unspecified: Secondary | ICD-10-CM | POA: Diagnosis not present

## 2019-01-04 DIAGNOSIS — D509 Iron deficiency anemia, unspecified: Secondary | ICD-10-CM | POA: Diagnosis not present

## 2019-01-04 DIAGNOSIS — Z992 Dependence on renal dialysis: Secondary | ICD-10-CM | POA: Diagnosis not present

## 2019-01-04 DIAGNOSIS — N185 Chronic kidney disease, stage 5: Secondary | ICD-10-CM | POA: Diagnosis not present

## 2019-01-04 DIAGNOSIS — Z4901 Encounter for fitting and adjustment of extracorporeal dialysis catheter: Secondary | ICD-10-CM | POA: Diagnosis not present

## 2019-01-04 DIAGNOSIS — N2581 Secondary hyperparathyroidism of renal origin: Secondary | ICD-10-CM | POA: Diagnosis not present

## 2019-01-04 DIAGNOSIS — D631 Anemia in chronic kidney disease: Secondary | ICD-10-CM | POA: Diagnosis not present

## 2019-01-04 DIAGNOSIS — E1121 Type 2 diabetes mellitus with diabetic nephropathy: Secondary | ICD-10-CM | POA: Diagnosis not present

## 2019-01-04 DIAGNOSIS — N186 End stage renal disease: Secondary | ICD-10-CM | POA: Diagnosis not present

## 2019-01-07 DIAGNOSIS — D689 Coagulation defect, unspecified: Secondary | ICD-10-CM | POA: Diagnosis not present

## 2019-01-07 DIAGNOSIS — D631 Anemia in chronic kidney disease: Secondary | ICD-10-CM | POA: Diagnosis not present

## 2019-01-07 DIAGNOSIS — N185 Chronic kidney disease, stage 5: Secondary | ICD-10-CM | POA: Diagnosis not present

## 2019-01-07 DIAGNOSIS — Z4901 Encounter for fitting and adjustment of extracorporeal dialysis catheter: Secondary | ICD-10-CM | POA: Diagnosis not present

## 2019-01-07 DIAGNOSIS — Z992 Dependence on renal dialysis: Secondary | ICD-10-CM | POA: Diagnosis not present

## 2019-01-07 DIAGNOSIS — N186 End stage renal disease: Secondary | ICD-10-CM | POA: Diagnosis not present

## 2019-01-07 DIAGNOSIS — N2581 Secondary hyperparathyroidism of renal origin: Secondary | ICD-10-CM | POA: Diagnosis not present

## 2019-01-07 DIAGNOSIS — E1121 Type 2 diabetes mellitus with diabetic nephropathy: Secondary | ICD-10-CM | POA: Diagnosis not present

## 2019-01-07 DIAGNOSIS — D509 Iron deficiency anemia, unspecified: Secondary | ICD-10-CM | POA: Diagnosis not present

## 2019-01-08 LAB — HM DIABETES EYE EXAM

## 2019-01-09 DIAGNOSIS — N185 Chronic kidney disease, stage 5: Secondary | ICD-10-CM | POA: Diagnosis not present

## 2019-01-09 DIAGNOSIS — E1121 Type 2 diabetes mellitus with diabetic nephropathy: Secondary | ICD-10-CM | POA: Diagnosis not present

## 2019-01-09 DIAGNOSIS — Z4901 Encounter for fitting and adjustment of extracorporeal dialysis catheter: Secondary | ICD-10-CM | POA: Diagnosis not present

## 2019-01-09 DIAGNOSIS — N186 End stage renal disease: Secondary | ICD-10-CM | POA: Diagnosis not present

## 2019-01-09 DIAGNOSIS — D689 Coagulation defect, unspecified: Secondary | ICD-10-CM | POA: Diagnosis not present

## 2019-01-09 DIAGNOSIS — D509 Iron deficiency anemia, unspecified: Secondary | ICD-10-CM | POA: Diagnosis not present

## 2019-01-09 DIAGNOSIS — Z992 Dependence on renal dialysis: Secondary | ICD-10-CM | POA: Diagnosis not present

## 2019-01-09 DIAGNOSIS — N2581 Secondary hyperparathyroidism of renal origin: Secondary | ICD-10-CM | POA: Diagnosis not present

## 2019-01-09 DIAGNOSIS — D631 Anemia in chronic kidney disease: Secondary | ICD-10-CM | POA: Diagnosis not present

## 2019-01-11 DIAGNOSIS — N186 End stage renal disease: Secondary | ICD-10-CM | POA: Diagnosis not present

## 2019-01-11 DIAGNOSIS — Z992 Dependence on renal dialysis: Secondary | ICD-10-CM | POA: Diagnosis not present

## 2019-01-11 DIAGNOSIS — Z4901 Encounter for fitting and adjustment of extracorporeal dialysis catheter: Secondary | ICD-10-CM | POA: Diagnosis not present

## 2019-01-11 DIAGNOSIS — D689 Coagulation defect, unspecified: Secondary | ICD-10-CM | POA: Diagnosis not present

## 2019-01-11 DIAGNOSIS — N185 Chronic kidney disease, stage 5: Secondary | ICD-10-CM | POA: Diagnosis not present

## 2019-01-11 DIAGNOSIS — D509 Iron deficiency anemia, unspecified: Secondary | ICD-10-CM | POA: Diagnosis not present

## 2019-01-11 DIAGNOSIS — E1121 Type 2 diabetes mellitus with diabetic nephropathy: Secondary | ICD-10-CM | POA: Diagnosis not present

## 2019-01-11 DIAGNOSIS — N2581 Secondary hyperparathyroidism of renal origin: Secondary | ICD-10-CM | POA: Diagnosis not present

## 2019-01-11 DIAGNOSIS — D631 Anemia in chronic kidney disease: Secondary | ICD-10-CM | POA: Diagnosis not present

## 2019-01-14 DIAGNOSIS — E1121 Type 2 diabetes mellitus with diabetic nephropathy: Secondary | ICD-10-CM | POA: Diagnosis not present

## 2019-01-14 DIAGNOSIS — D509 Iron deficiency anemia, unspecified: Secondary | ICD-10-CM | POA: Diagnosis not present

## 2019-01-14 DIAGNOSIS — D689 Coagulation defect, unspecified: Secondary | ICD-10-CM | POA: Diagnosis not present

## 2019-01-14 DIAGNOSIS — D631 Anemia in chronic kidney disease: Secondary | ICD-10-CM | POA: Diagnosis not present

## 2019-01-14 DIAGNOSIS — Z4901 Encounter for fitting and adjustment of extracorporeal dialysis catheter: Secondary | ICD-10-CM | POA: Diagnosis not present

## 2019-01-14 DIAGNOSIS — N186 End stage renal disease: Secondary | ICD-10-CM | POA: Diagnosis not present

## 2019-01-14 DIAGNOSIS — N185 Chronic kidney disease, stage 5: Secondary | ICD-10-CM | POA: Diagnosis not present

## 2019-01-14 DIAGNOSIS — Z992 Dependence on renal dialysis: Secondary | ICD-10-CM | POA: Diagnosis not present

## 2019-01-14 DIAGNOSIS — N2581 Secondary hyperparathyroidism of renal origin: Secondary | ICD-10-CM | POA: Diagnosis not present

## 2019-01-16 DIAGNOSIS — N185 Chronic kidney disease, stage 5: Secondary | ICD-10-CM | POA: Diagnosis not present

## 2019-01-16 DIAGNOSIS — E1121 Type 2 diabetes mellitus with diabetic nephropathy: Secondary | ICD-10-CM | POA: Diagnosis not present

## 2019-01-16 DIAGNOSIS — D631 Anemia in chronic kidney disease: Secondary | ICD-10-CM | POA: Diagnosis not present

## 2019-01-16 DIAGNOSIS — Z992 Dependence on renal dialysis: Secondary | ICD-10-CM | POA: Diagnosis not present

## 2019-01-16 DIAGNOSIS — D689 Coagulation defect, unspecified: Secondary | ICD-10-CM | POA: Diagnosis not present

## 2019-01-16 DIAGNOSIS — N186 End stage renal disease: Secondary | ICD-10-CM | POA: Diagnosis not present

## 2019-01-16 DIAGNOSIS — Z4901 Encounter for fitting and adjustment of extracorporeal dialysis catheter: Secondary | ICD-10-CM | POA: Diagnosis not present

## 2019-01-16 DIAGNOSIS — N2581 Secondary hyperparathyroidism of renal origin: Secondary | ICD-10-CM | POA: Diagnosis not present

## 2019-01-16 DIAGNOSIS — D509 Iron deficiency anemia, unspecified: Secondary | ICD-10-CM | POA: Diagnosis not present

## 2019-01-18 DIAGNOSIS — Z992 Dependence on renal dialysis: Secondary | ICD-10-CM | POA: Diagnosis not present

## 2019-01-18 DIAGNOSIS — D509 Iron deficiency anemia, unspecified: Secondary | ICD-10-CM | POA: Diagnosis not present

## 2019-01-18 DIAGNOSIS — Z4901 Encounter for fitting and adjustment of extracorporeal dialysis catheter: Secondary | ICD-10-CM | POA: Diagnosis not present

## 2019-01-18 DIAGNOSIS — N186 End stage renal disease: Secondary | ICD-10-CM | POA: Diagnosis not present

## 2019-01-18 DIAGNOSIS — D689 Coagulation defect, unspecified: Secondary | ICD-10-CM | POA: Diagnosis not present

## 2019-01-18 DIAGNOSIS — E1121 Type 2 diabetes mellitus with diabetic nephropathy: Secondary | ICD-10-CM | POA: Diagnosis not present

## 2019-01-18 DIAGNOSIS — N2581 Secondary hyperparathyroidism of renal origin: Secondary | ICD-10-CM | POA: Diagnosis not present

## 2019-01-18 DIAGNOSIS — N185 Chronic kidney disease, stage 5: Secondary | ICD-10-CM | POA: Diagnosis not present

## 2019-01-18 DIAGNOSIS — D631 Anemia in chronic kidney disease: Secondary | ICD-10-CM | POA: Diagnosis not present

## 2019-01-21 DIAGNOSIS — N185 Chronic kidney disease, stage 5: Secondary | ICD-10-CM | POA: Diagnosis not present

## 2019-01-21 DIAGNOSIS — E1121 Type 2 diabetes mellitus with diabetic nephropathy: Secondary | ICD-10-CM | POA: Diagnosis not present

## 2019-01-21 DIAGNOSIS — D509 Iron deficiency anemia, unspecified: Secondary | ICD-10-CM | POA: Diagnosis not present

## 2019-01-21 DIAGNOSIS — D631 Anemia in chronic kidney disease: Secondary | ICD-10-CM | POA: Diagnosis not present

## 2019-01-21 DIAGNOSIS — N2581 Secondary hyperparathyroidism of renal origin: Secondary | ICD-10-CM | POA: Diagnosis not present

## 2019-01-21 DIAGNOSIS — Z4901 Encounter for fitting and adjustment of extracorporeal dialysis catheter: Secondary | ICD-10-CM | POA: Diagnosis not present

## 2019-01-21 DIAGNOSIS — N186 End stage renal disease: Secondary | ICD-10-CM | POA: Diagnosis not present

## 2019-01-21 DIAGNOSIS — Z992 Dependence on renal dialysis: Secondary | ICD-10-CM | POA: Diagnosis not present

## 2019-01-21 DIAGNOSIS — D689 Coagulation defect, unspecified: Secondary | ICD-10-CM | POA: Diagnosis not present

## 2019-01-23 DIAGNOSIS — Z4901 Encounter for fitting and adjustment of extracorporeal dialysis catheter: Secondary | ICD-10-CM | POA: Diagnosis not present

## 2019-01-23 DIAGNOSIS — D631 Anemia in chronic kidney disease: Secondary | ICD-10-CM | POA: Diagnosis not present

## 2019-01-23 DIAGNOSIS — D509 Iron deficiency anemia, unspecified: Secondary | ICD-10-CM | POA: Diagnosis not present

## 2019-01-23 DIAGNOSIS — N185 Chronic kidney disease, stage 5: Secondary | ICD-10-CM | POA: Diagnosis not present

## 2019-01-23 DIAGNOSIS — N186 End stage renal disease: Secondary | ICD-10-CM | POA: Diagnosis not present

## 2019-01-23 DIAGNOSIS — D689 Coagulation defect, unspecified: Secondary | ICD-10-CM | POA: Diagnosis not present

## 2019-01-23 DIAGNOSIS — E1121 Type 2 diabetes mellitus with diabetic nephropathy: Secondary | ICD-10-CM | POA: Diagnosis not present

## 2019-01-23 DIAGNOSIS — Z992 Dependence on renal dialysis: Secondary | ICD-10-CM | POA: Diagnosis not present

## 2019-01-23 DIAGNOSIS — N2581 Secondary hyperparathyroidism of renal origin: Secondary | ICD-10-CM | POA: Diagnosis not present

## 2019-01-24 DIAGNOSIS — J449 Chronic obstructive pulmonary disease, unspecified: Secondary | ICD-10-CM | POA: Diagnosis not present

## 2019-01-25 DIAGNOSIS — N2581 Secondary hyperparathyroidism of renal origin: Secondary | ICD-10-CM | POA: Diagnosis not present

## 2019-01-25 DIAGNOSIS — Z4901 Encounter for fitting and adjustment of extracorporeal dialysis catheter: Secondary | ICD-10-CM | POA: Diagnosis not present

## 2019-01-25 DIAGNOSIS — D631 Anemia in chronic kidney disease: Secondary | ICD-10-CM | POA: Diagnosis not present

## 2019-01-25 DIAGNOSIS — N185 Chronic kidney disease, stage 5: Secondary | ICD-10-CM | POA: Diagnosis not present

## 2019-01-25 DIAGNOSIS — E1121 Type 2 diabetes mellitus with diabetic nephropathy: Secondary | ICD-10-CM | POA: Diagnosis not present

## 2019-01-25 DIAGNOSIS — N186 End stage renal disease: Secondary | ICD-10-CM | POA: Diagnosis not present

## 2019-01-25 DIAGNOSIS — Z992 Dependence on renal dialysis: Secondary | ICD-10-CM | POA: Diagnosis not present

## 2019-01-25 DIAGNOSIS — D689 Coagulation defect, unspecified: Secondary | ICD-10-CM | POA: Diagnosis not present

## 2019-01-25 DIAGNOSIS — D509 Iron deficiency anemia, unspecified: Secondary | ICD-10-CM | POA: Diagnosis not present

## 2019-01-28 DIAGNOSIS — D509 Iron deficiency anemia, unspecified: Secondary | ICD-10-CM | POA: Diagnosis not present

## 2019-01-28 DIAGNOSIS — N185 Chronic kidney disease, stage 5: Secondary | ICD-10-CM | POA: Diagnosis not present

## 2019-01-28 DIAGNOSIS — N2581 Secondary hyperparathyroidism of renal origin: Secondary | ICD-10-CM | POA: Diagnosis not present

## 2019-01-28 DIAGNOSIS — Z992 Dependence on renal dialysis: Secondary | ICD-10-CM | POA: Diagnosis not present

## 2019-01-28 DIAGNOSIS — D689 Coagulation defect, unspecified: Secondary | ICD-10-CM | POA: Diagnosis not present

## 2019-01-28 DIAGNOSIS — N186 End stage renal disease: Secondary | ICD-10-CM | POA: Diagnosis not present

## 2019-01-28 DIAGNOSIS — E1121 Type 2 diabetes mellitus with diabetic nephropathy: Secondary | ICD-10-CM | POA: Diagnosis not present

## 2019-01-28 DIAGNOSIS — D631 Anemia in chronic kidney disease: Secondary | ICD-10-CM | POA: Diagnosis not present

## 2019-01-28 DIAGNOSIS — Z4901 Encounter for fitting and adjustment of extracorporeal dialysis catheter: Secondary | ICD-10-CM | POA: Diagnosis not present

## 2019-01-28 DIAGNOSIS — I480 Paroxysmal atrial fibrillation: Secondary | ICD-10-CM | POA: Diagnosis not present

## 2019-01-30 ENCOUNTER — Other Ambulatory Visit: Payer: Self-pay | Admitting: Family Medicine

## 2019-01-30 DIAGNOSIS — N186 End stage renal disease: Secondary | ICD-10-CM | POA: Diagnosis not present

## 2019-01-30 DIAGNOSIS — E1121 Type 2 diabetes mellitus with diabetic nephropathy: Secondary | ICD-10-CM | POA: Diagnosis not present

## 2019-01-30 DIAGNOSIS — D689 Coagulation defect, unspecified: Secondary | ICD-10-CM | POA: Diagnosis not present

## 2019-01-30 DIAGNOSIS — D509 Iron deficiency anemia, unspecified: Secondary | ICD-10-CM | POA: Diagnosis not present

## 2019-01-30 DIAGNOSIS — K219 Gastro-esophageal reflux disease without esophagitis: Secondary | ICD-10-CM

## 2019-01-30 DIAGNOSIS — D631 Anemia in chronic kidney disease: Secondary | ICD-10-CM | POA: Diagnosis not present

## 2019-01-30 DIAGNOSIS — G2581 Restless legs syndrome: Secondary | ICD-10-CM

## 2019-01-30 DIAGNOSIS — N2581 Secondary hyperparathyroidism of renal origin: Secondary | ICD-10-CM | POA: Diagnosis not present

## 2019-01-30 DIAGNOSIS — Z992 Dependence on renal dialysis: Secondary | ICD-10-CM | POA: Diagnosis not present

## 2019-01-30 DIAGNOSIS — Z4901 Encounter for fitting and adjustment of extracorporeal dialysis catheter: Secondary | ICD-10-CM | POA: Diagnosis not present

## 2019-01-30 DIAGNOSIS — N185 Chronic kidney disease, stage 5: Secondary | ICD-10-CM | POA: Diagnosis not present

## 2019-02-01 ENCOUNTER — Other Ambulatory Visit: Payer: Self-pay

## 2019-02-01 ENCOUNTER — Ambulatory Visit (INDEPENDENT_AMBULATORY_CARE_PROVIDER_SITE_OTHER): Payer: Medicare Other

## 2019-02-01 DIAGNOSIS — D62 Acute posthemorrhagic anemia: Secondary | ICD-10-CM | POA: Diagnosis not present

## 2019-02-01 DIAGNOSIS — D509 Iron deficiency anemia, unspecified: Secondary | ICD-10-CM | POA: Diagnosis not present

## 2019-02-01 DIAGNOSIS — N186 End stage renal disease: Secondary | ICD-10-CM | POA: Diagnosis not present

## 2019-02-01 DIAGNOSIS — I120 Hypertensive chronic kidney disease with stage 5 chronic kidney disease or end stage renal disease: Secondary | ICD-10-CM

## 2019-02-01 DIAGNOSIS — E1122 Type 2 diabetes mellitus with diabetic chronic kidney disease: Secondary | ICD-10-CM

## 2019-02-01 DIAGNOSIS — Z4901 Encounter for fitting and adjustment of extracorporeal dialysis catheter: Secondary | ICD-10-CM | POA: Diagnosis not present

## 2019-02-01 DIAGNOSIS — D631 Anemia in chronic kidney disease: Secondary | ICD-10-CM | POA: Diagnosis not present

## 2019-02-01 DIAGNOSIS — E1121 Type 2 diabetes mellitus with diabetic nephropathy: Secondary | ICD-10-CM | POA: Diagnosis not present

## 2019-02-01 DIAGNOSIS — K922 Gastrointestinal hemorrhage, unspecified: Secondary | ICD-10-CM | POA: Diagnosis not present

## 2019-02-01 DIAGNOSIS — Z992 Dependence on renal dialysis: Secondary | ICD-10-CM | POA: Diagnosis not present

## 2019-02-01 DIAGNOSIS — N2581 Secondary hyperparathyroidism of renal origin: Secondary | ICD-10-CM | POA: Diagnosis not present

## 2019-02-01 DIAGNOSIS — N185 Chronic kidney disease, stage 5: Secondary | ICD-10-CM | POA: Diagnosis not present

## 2019-02-01 DIAGNOSIS — D689 Coagulation defect, unspecified: Secondary | ICD-10-CM | POA: Diagnosis not present

## 2019-02-02 DIAGNOSIS — Z992 Dependence on renal dialysis: Secondary | ICD-10-CM | POA: Diagnosis not present

## 2019-02-02 DIAGNOSIS — N186 End stage renal disease: Secondary | ICD-10-CM | POA: Diagnosis not present

## 2019-02-02 DIAGNOSIS — E1122 Type 2 diabetes mellitus with diabetic chronic kidney disease: Secondary | ICD-10-CM | POA: Diagnosis not present

## 2019-02-04 ENCOUNTER — Telehealth: Payer: Self-pay | Admitting: Adult Health

## 2019-02-04 ENCOUNTER — Ambulatory Visit (INDEPENDENT_AMBULATORY_CARE_PROVIDER_SITE_OTHER): Payer: Medicare Other | Admitting: Adult Health

## 2019-02-04 ENCOUNTER — Other Ambulatory Visit: Payer: Self-pay

## 2019-02-04 ENCOUNTER — Encounter: Payer: Self-pay | Admitting: Adult Health

## 2019-02-04 VITALS — BP 172/88 | HR 71 | Ht 65.0 in | Wt 162.3 lb

## 2019-02-04 DIAGNOSIS — D631 Anemia in chronic kidney disease: Secondary | ICD-10-CM | POA: Diagnosis not present

## 2019-02-04 DIAGNOSIS — N2581 Secondary hyperparathyroidism of renal origin: Secondary | ICD-10-CM | POA: Diagnosis not present

## 2019-02-04 DIAGNOSIS — Z79899 Other long term (current) drug therapy: Secondary | ICD-10-CM | POA: Diagnosis not present

## 2019-02-04 DIAGNOSIS — K219 Gastro-esophageal reflux disease without esophagitis: Secondary | ICD-10-CM | POA: Diagnosis not present

## 2019-02-04 DIAGNOSIS — E039 Hypothyroidism, unspecified: Secondary | ICD-10-CM | POA: Diagnosis not present

## 2019-02-04 DIAGNOSIS — Z4901 Encounter for fitting and adjustment of extracorporeal dialysis catheter: Secondary | ICD-10-CM | POA: Diagnosis not present

## 2019-02-04 DIAGNOSIS — I10 Essential (primary) hypertension: Secondary | ICD-10-CM | POA: Diagnosis not present

## 2019-02-04 DIAGNOSIS — N186 End stage renal disease: Secondary | ICD-10-CM | POA: Diagnosis not present

## 2019-02-04 DIAGNOSIS — D689 Coagulation defect, unspecified: Secondary | ICD-10-CM | POA: Diagnosis not present

## 2019-02-04 DIAGNOSIS — Z992 Dependence on renal dialysis: Secondary | ICD-10-CM | POA: Diagnosis not present

## 2019-02-04 MED ORDER — PANTOPRAZOLE SODIUM 20 MG PO TBEC: 20.0000 mg | DELAYED_RELEASE_TABLET | Freq: Every day | ORAL | 6 refills | Status: DC

## 2019-02-04 NOTE — Telephone Encounter (Signed)
New message    Daughter requesting to accompany patient at appointment today, states her mother needs assistance walking

## 2019-02-04 NOTE — Telephone Encounter (Signed)
Called daughter back- advised of the policy for the office- she states she is the POA for her mother, and she is deaf in one ear and unable to understand what the providers are saying, she also has difficulty walking.  I advised I would send a message back to Curt Bears and make her aware as she has to give okay for her to come back.

## 2019-02-04 NOTE — Progress Notes (Signed)
Cardiology Office Note   Date:  02/04/2019   ID:  SCOTTLYN MCHANEY, DOB [birth year], MRN [id number]  PCP:  [name], FNP  Cardiologist: Dr. Percival Spanish  CC: Follow Up   History of Present Illness: [name] is a 78 y.o. female who presents for ongoing assessment and management of atrial fibrillation, hypertension, and hyperlipidemia,  with other history of diet controlled diabetes, Was last seen in the office by Dr. Percival Spanish 05/22/2018 and was in NSR, left arm hematoma at left arm fistula  was well healed  She is not currently on anticoagulation.   She comes today with her daughter who is worried about her lack of appetite and weight loss. She has been losing up to 4 lbs a week, but usually only 2 lbs a week. She states that she is not as hungry as before. She is drinking a nutritional supplement.   She also complains of some dysphagia, especially when trying to swallow her medications. She has to eat slowly to swallow the foods. She had been on pantoprazole 40 mg BID in the past for some esophageal erosion  She is no longer taking this.    She denies chest pain, dizziness, worsening fatigue. She is medically compliant. Goes to dialysis on MWF.   Past Medical History:  Diagnosis Date   Acute respiratory failure ([place]) 05/2017   Anemia    Anxiety    Arthritis    COPD (chronic obstructive pulmonary disease) ([place])    Depression    Diabetes mellitus    "diet controlled"   ESRD (end stage renal disease) ([place])    dialysis MWF   GERD (gastroesophageal reflux disease)    Gout    History of blood transfusion    HLD (hyperlipidemia)    HOH (hard of hearing)    left ear   Hypertension    hypotensive -since starting dialysis   Hypothyroidism    MRSA (methicillin resistant staph aureus) culture positive 06/01/2017   PAF (paroxysmal atrial fibrillation) ([place])    a. 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   Wears dentures    Wears glasses     Past Surgical History:  Procedure Laterality Date   AV FISTULA PLACEMENT  04/03/2012   Procedure: ARTERIOVENOUS (AV) FISTULA CREATION;  Surgeon: Elam Dutch, MD;  Location: [place];  Service: Vascular;  Laterality: Left;  creation left brachial cephalic fistula    BIOPSY  08/21/2018   Procedure: BIOPSY;  Surgeon: Thornton Park, MD;  Location: [place];  Service: Gastroenterology;;   CARPAL TUNNEL RELEASE Bilateral    COLONOSCOPY W/ POLYPECTOMY     COLONOSCOPY WITH PROPOFOL N/A 08/21/2018   Procedure: COLONOSCOPY WITH PROPOFOL;  Surgeon: Thornton Park, MD;  Location: [place];  Service: Gastroenterology;  Laterality: N/A;   DILATION AND CURETTAGE OF UTERUS     ESOPHAGOGASTRODUODENOSCOPY (EGD) WITH PROPOFOL N/A 08/21/2018   Procedure: ESOPHAGOGASTRODUODENOSCOPY (EGD) WITH PROPOFOL;  Surgeon: Thornton Park, MD;  Location: [place];  Service: Gastroenterology;  Laterality: N/A;   EYE SURGERY Bilateral    bilateral cataract removal   HEMATOMA EVACUATION Left 05/14/2018   Procedure: Incision and Drainage of Left Arm Hematoma;  Surgeon: Elam Dutch, MD;  Location: [place];  Service:  Vascular;  Laterality: Left;   Hemodialysis  catheter Right    IR FLUORO GUIDE CV LINE RIGHT  05/26/2017   IR REMOVAL TUN CV CATH W/O FL  05/24/2017   IR US GUIDE VASC ACCESS RIGHT  05/26/2017   LIGATION OF ARTERIOVENOUS  FISTULA Left 02/04/2015   Procedure: LIGATION OF BRACHIOCEPHALIC ARTERIOVENOUS  FISTULA;  Surgeon: Conrad Moody, MD;  Location: [place];  Service: Vascular;  Laterality: Left;   MULTIPLE TOOTH EXTRACTIONS     POLYPECTOMY  08/21/2018   Procedure: POLYPECTOMY;  Surgeon: Thornton Park, MD;  Location: [facility];  Service: Gastroenterology;;   PORTACATH  PLACEMENT     REVERSE SHOULDER ARTHROPLASTY Left 01/22/2016   Procedure: LEFT REVERSE SHOULDER ARTHROPLASTY;  Surgeon: Netta Cedars, MD;  Location: [place];  Service: Orthopedics;  Laterality: Left;   SHOULDER ARTHROSCOPY Bilateral    SPLIT NIGHT STUDY  07/26/2015   STERIOD INJECTION Right 01/22/2016   Procedure: RIGHT RING FINGER STEROID INJECTION;  Surgeon: Netta Cedars, MD;  Location: [place];  Service: Orthopedics;  Laterality: Right;   TEE WITHOUT CARDIOVERSION N/A 05/26/2017   Procedure: TRANSESOPHAGEAL ECHOCARDIOGRAM (TEE);  Surgeon: Lelon Perla, MD;  Location: [place];  Service: Cardiovascular;  Laterality: N/A;   THROMBECTOMY BRACHIAL ARTERY Left 02/06/2015   Procedure: EVACUATION OF LEFT ARM HEMATOMA;  Surgeon: Angelia Mould, MD;  Location: [place];  Service: Vascular;  Laterality: Left;   TOTAL KNEE ARTHROPLASTY Bilateral    TUBAL LIGATION       Current Outpatient Medications  Medication Sig Dispense Refill   acetaminophen (TYLENOL) 500 MG tablet Take 2 tablets (1,000 mg total) by mouth every 8 (eight) hours as needed for mild pain or headache. 30 tablet 0   albuterol (VENTOLIN HFA) 108 (90 Base) MCG/ACT inhaler Inhale 2 puffs into the lungs every 6 (six) hours as needed for wheezing or shortness of breath. 18 g 2   alendronate (FOSAMAX) 70 MG tablet Take 1 tablet (70 mg total) by mouth every Thursday. 12 tablet 3   aspirin EC 81 MG tablet Take 81 mg by mouth daily.     budesonide-formoterol (SYMBICORT) 80-4.5 MCG/ACT inhaler Inhale 2 puffs into the lungs 2 (two) times daily. 3 Inhaler 4   cinacalcet (SENSIPAR) 30 MG tablet Take 1 tablet (30 mg total) by mouth every Monday, Wednesday, and Friday. 60 tablet 0   citalopram (CELEXA) 40 MG tablet Take 1 tablet (40 mg total) by mouth daily. 90 tablet 1   colchicine 0.6 MG tablet Take 2 tablets by mouth at onset of gout flare and then 1 tablet 1 hour later. Use as needed. 15 tablet 1   docusate calcium  (SURFAK) 240 MG capsule Take 1 capsule (240 mg total) by mouth daily. 90 capsule 1   doxercalciferol (HECTOROL) 4 MCG/2ML injection Inject 0.5 mLs (1 mcg total) into the vein every Monday, Wednesday, and Friday with hemodialysis. 2 mL 0   ezetimibe (ZETIA) 10 MG tablet Take 1 tablet (10 mg total) by mouth daily. 90 tablet 3   famotidine (PEPCID) 20 MG tablet TAKE 1 TABLET BY MOUTH TWICE A DAY 180 tablet 1   HYDROcodone-acetaminophen (NORCO/VICODIN) 5-325 MG tablet Take 1 tablet by mouth every 6 (six) hours as needed for moderate pain. 30 tablet 0   HYDROcodone-acetaminophen (NORCO/VICODIN) 5-325 MG tablet Take 1 tablet by mouth every 6 (six) hours as needed for moderate pain. 30 tablet 0   levothyroxine (SYNTHROID) 100 MCG tablet Take 1 tablet (100 mcg total) by mouth  daily. 90 tablet 3   Multiple Vitamin (MULTIVITAMIN) tablet Take 1 tablet by mouth daily.     multivitamin (RENA-VIT) TABS tablet Take 1 tablet by mouth daily.     nystatin cream (MYCOSTATIN) Apply 1 application topically 2 (two) times daily as needed for dry skin. 30 g 0   OXYGEN Inhale 3 L into the lungs continuous.     pantoprazole (PROTONIX) 40 MG tablet Take 1 tablet (40 mg total) by mouth 2 (two) times daily. 180 tablet 3   pramipexole (MIRAPEX) 0.125 MG tablet TAKE 1 TABLET (0.125 MG TOTAL) BY MOUTH AT BEDTIME. 90 tablet 1   Vitamin D, Ergocalciferol, (DRISDOL) 1.25 MG (50000 UT) CAPS capsule Take 1 capsule (50,000 Units total) by mouth every Monday. 12 capsule 3   No current facility-administered medications for this visit.     Allergies:   Amoxicillin, Sulfa drugs cross reactors, Percocet [oxycodone-acetaminophen], and Sulfa antibiotics    Social History:  The patient  reports that she quit smoking about 20 years ago. Her smoking use included cigarettes. She has a 0.50 pack-year smoking history. She has never used smokeless tobacco. She reports that she does not drink alcohol or use drugs.   Family History:   The patient's family history includes Arthritis in her mother; Cancer in her brother and sister; Dementia in her sister; Diabetes in her daughter, daughter, father, and sister; Heart disease in her father; Hepatitis C in her son; Hyperlipidemia in her daughter, daughter, and son; Hypertension in her daughter, daughter, and son; Other in her sister; Stroke in her brother.    ROS: All other systems are reviewed and negative. Unless otherwise mentioned in H&P    PHYSICAL EXAM: VS:  BP (!) 172/88    Pulse 71    Ht _0  (1.651 m)    Wt 162 lb 4.8 oz (73.6 kg)    SpO2 97%    BMI 27.01 kg/m  , BMI Body mass index is 27.01 kg/m. GEN: Well nourished, well developed, in no acute distress,frail. HEENT: normal Neck: no JVD, carotid bruits, or masses Cardiac:RRR; tachycardic, no murmurs, rubs, or gallops,no edema  Respiratory:  Clear to auscultation bilaterally, normal work of breathing,she is wearing O2 via [place]. GI: soft, nontender, nondistended, + BS MS: no deformity or atrophy, AV fistula to the left arm.  Skin: warm and dry, no rash Neuro:  Strength and sensation are intact Psych: euthymic mood, full affect   EKG:  Not completed this office visit.   Recent Labs: 09/06/2018: ALT 15; BUN 23; Creatinine, Ser 6.24; Hemoglobin 11.3; Platelets 148; Potassium 3.9; Sodium 133    Lipid Panel    Component Value Date/Time   CHOL 107 05/25/2017 0421   TRIG 88 05/25/2017 0421   HDL 32 (L) 05/25/2017 0421   CHOLHDL 3.3 05/25/2017 0421   VLDL 18 05/25/2017 0421   LDLCALC 57 05/25/2017 0421      Wt Readings from Last 3 Encounters:  02/04/19 162 lb 4.8 oz (73.6 kg)  11/09/18 172 lb (78 kg)  10/26/18 178 lb (80.7 kg)      Other studies Reviewed: Echocardiogram 2/29/2019 Left ventricle: The cavity size was normal. There was moderate concentric hypertrophy. Systolic function was normal. The estimated ejection fraction was in the range of 60% to 65%. Wall motion was normal; there were no  regional wall motion abnormalities. - Aortic valve: Transvalvular velocity was within the normal range. There was no stenosis. There was no regurgitation. - Mitral valve: Transvalvular velocity was within  the normal range. There was no evidence for stenosis. There was no regurgitation. - Left atrium: The atrium was severely dilated. - Right ventricle: The cavity size was normal. Wall thickness was normal. Systolic function was normal. - Atrial septum: No defect or patent foramen ovale was identified. - Tricuspid valve: There was mild regurgitation. - Pulmonary arteries: Systolic pressure was within the normal range. PA peak pressure: 26 mm Hg (S).    ASSESSMENT AND PLAN:  1. Hypertension: BP is elevated today on initial check. I have rechecked it in the office and found it to be 134/78.  No changes or planned testing at this time.   2. ESRD: Dialysis MWF.   3. Weight loss: She is not eating very much over the last 4 months with about 15-18 lb weight loss.  I will check TSH and BMET today. She is to continue nutritional supplement.   4. Dysphasia:  I will start her back on low dose pantoprazole 20 mg daily. This may help with her swallowing. Would recommend GI follow up if clinically warranted.   Current medicines are reviewed at length with the patient today.    Labs/ tests ordered today include: BMET, CBC, TSH, Mg.   Phill Myron. West Pugh, ANP, AACC   02/04/2019 2:22 PM    [facility] [place] [address] Office [phone] Fax [phone]  Notice: This dictation was prepared with Dragon dictation along with smaller phrase technology. Any transcriptional errors that result from this process are unintentional and may not be corrected upon review.

## 2019-02-04 NOTE — Patient Instructions (Addendum)
Medication Instructions:  START- Pantoprazole(Protonix) 20 mg by mouth daily  If you need a refill on your cardiac medications before your next appointment, please call your pharmacy.  Labwork: CBC, TSH, BMP and Magnesium HERE IN OUR OFFICE AT [facility]  You will NOT need to fast   If you have labs (blood work) drawn today and your tests are completely normal, you will receive your results only by: Marland Kitchen MyChart Message (if you have MyChart) OR . A paper copy in the mail If you have any lab test that is abnormal or we need to change your treatment, we will call you to review the results.  Testing/Procedures: None Ordered  Follow-Up: IN 6 months Please call our office 2 months in advance,  to schedule this 6 Months appointment. In Person Minus Breeding, MD.    At [facility], you and your health needs are our priority.  As part of our continuing mission to provide you with exceptional heart care, we have created designated Provider Care Teams.  These Care Teams include your primary Cardiologist (physician) and Advanced Practice Providers (APPs -  Physician Assistants and Nurse Practitioners) who all work together to provide you with the care you need, when you need it.  Thank you for choosing [facility] at [facility]!!

## 2019-02-04 NOTE — Telephone Encounter (Signed)
Its okay with me to let her daughter come with her to the exam room.   KL

## 2019-02-05 LAB — CBC
Hematocrit: 37.3 % (ref 34.0–46.6)
Hemoglobin: 12.1 g/dL (ref 11.1–15.9)
MCH: 30.6 pg (ref 26.6–33.0)
MCHC: 32.4 g/dL (ref 31.5–35.7)
MCV: 94 fL (ref 79–97)
Platelets: 210 10*3/uL (ref 150–450)
RBC: 3.95 x10E6/uL (ref 3.77–5.28)
RDW: 14.4 % (ref 11.7–15.4)
WBC: 7.4 10*3/uL (ref 3.4–10.8)

## 2019-02-05 LAB — BASIC METABOLIC PANEL
BUN/Creatinine Ratio: 3 — ABNORMAL LOW (ref 12–28)
BUN: 10 mg/dL (ref 8–27)
CO2: 23 mmol/L (ref 20–29)
Calcium: 10.2 mg/dL (ref 8.7–10.3)
Chloride: 99 mmol/L (ref 96–106)
Creatinine, Ser: 3.66 mg/dL — ABNORMAL HIGH (ref 0.57–1.00)
GFR calc Af Amer: 13 mL/min/{1.73_m2} — ABNORMAL LOW (ref >59)
GFR calc non Af Amer: 11 mL/min/{1.73_m2} — ABNORMAL LOW (ref >59)
Glucose: 88 mg/dL (ref 65–99)
Potassium: 4.3 mmol/L (ref 3.5–5.2)
Sodium: 141 mmol/L (ref 134–144)

## 2019-02-05 LAB — MAGNESIUM: Magnesium: 2.1 mg/dL (ref 1.6–2.3)

## 2019-02-05 LAB — TSH: TSH: 2.02 u[IU]/mL (ref 0.450–4.500)

## 2019-02-06 DIAGNOSIS — Z992 Dependence on renal dialysis: Secondary | ICD-10-CM | POA: Diagnosis not present

## 2019-02-06 DIAGNOSIS — Z4901 Encounter for fitting and adjustment of extracorporeal dialysis catheter: Secondary | ICD-10-CM | POA: Diagnosis not present

## 2019-02-06 DIAGNOSIS — N2581 Secondary hyperparathyroidism of renal origin: Secondary | ICD-10-CM | POA: Diagnosis not present

## 2019-02-06 DIAGNOSIS — D631 Anemia in chronic kidney disease: Secondary | ICD-10-CM | POA: Diagnosis not present

## 2019-02-06 DIAGNOSIS — D689 Coagulation defect, unspecified: Secondary | ICD-10-CM | POA: Diagnosis not present

## 2019-02-06 DIAGNOSIS — N186 End stage renal disease: Secondary | ICD-10-CM | POA: Diagnosis not present

## 2019-02-08 DIAGNOSIS — D631 Anemia in chronic kidney disease: Secondary | ICD-10-CM | POA: Diagnosis not present

## 2019-02-08 DIAGNOSIS — D689 Coagulation defect, unspecified: Secondary | ICD-10-CM | POA: Diagnosis not present

## 2019-02-08 DIAGNOSIS — N2581 Secondary hyperparathyroidism of renal origin: Secondary | ICD-10-CM | POA: Diagnosis not present

## 2019-02-08 DIAGNOSIS — Z992 Dependence on renal dialysis: Secondary | ICD-10-CM | POA: Diagnosis not present

## 2019-02-08 DIAGNOSIS — N186 End stage renal disease: Secondary | ICD-10-CM | POA: Diagnosis not present

## 2019-02-08 DIAGNOSIS — Z4901 Encounter for fitting and adjustment of extracorporeal dialysis catheter: Secondary | ICD-10-CM | POA: Diagnosis not present

## 2019-02-11 DIAGNOSIS — D689 Coagulation defect, unspecified: Secondary | ICD-10-CM | POA: Diagnosis not present

## 2019-02-11 DIAGNOSIS — N186 End stage renal disease: Secondary | ICD-10-CM | POA: Diagnosis not present

## 2019-02-11 DIAGNOSIS — Z4901 Encounter for fitting and adjustment of extracorporeal dialysis catheter: Secondary | ICD-10-CM | POA: Diagnosis not present

## 2019-02-11 DIAGNOSIS — Z992 Dependence on renal dialysis: Secondary | ICD-10-CM | POA: Diagnosis not present

## 2019-02-11 DIAGNOSIS — D631 Anemia in chronic kidney disease: Secondary | ICD-10-CM | POA: Diagnosis not present

## 2019-02-11 DIAGNOSIS — N2581 Secondary hyperparathyroidism of renal origin: Secondary | ICD-10-CM | POA: Diagnosis not present

## 2019-02-12 ENCOUNTER — Ambulatory Visit (INDEPENDENT_AMBULATORY_CARE_PROVIDER_SITE_OTHER): Payer: Medicare Other | Admitting: Family Medicine

## 2019-02-12 ENCOUNTER — Telehealth: Payer: Self-pay | Admitting: Family Medicine

## 2019-02-12 ENCOUNTER — Other Ambulatory Visit: Payer: Self-pay

## 2019-02-12 DIAGNOSIS — M546 Pain in thoracic spine: Secondary | ICD-10-CM | POA: Diagnosis not present

## 2019-02-12 DIAGNOSIS — R634 Abnormal weight loss: Secondary | ICD-10-CM

## 2019-02-12 DIAGNOSIS — Z79899 Other long term (current) drug therapy: Secondary | ICD-10-CM

## 2019-02-12 DIAGNOSIS — Z780 Asymptomatic menopausal state: Secondary | ICD-10-CM

## 2019-02-12 DIAGNOSIS — K219 Gastro-esophageal reflux disease without esophagitis: Secondary | ICD-10-CM | POA: Diagnosis not present

## 2019-02-12 DIAGNOSIS — G8929 Other chronic pain: Secondary | ICD-10-CM

## 2019-02-12 DIAGNOSIS — F329 Major depressive disorder, single episode, unspecified: Secondary | ICD-10-CM

## 2019-02-12 DIAGNOSIS — M1990 Unspecified osteoarthritis, unspecified site: Secondary | ICD-10-CM

## 2019-02-12 DIAGNOSIS — Z992 Dependence on renal dialysis: Secondary | ICD-10-CM

## 2019-02-12 DIAGNOSIS — N186 End stage renal disease: Secondary | ICD-10-CM | POA: Diagnosis not present

## 2019-02-12 DIAGNOSIS — F419 Anxiety disorder, unspecified: Secondary | ICD-10-CM

## 2019-02-12 DIAGNOSIS — M810 Age-related osteoporosis without current pathological fracture: Secondary | ICD-10-CM

## 2019-02-12 NOTE — Progress Notes (Signed)
Virtual Visit via Telephone Note  I connected with [name] on 02/13/19 at 1:23 PM by telephone and verified that I am speaking with the correct person using two identifiers. [name] is currently located at the office and nobody is currently with her during this visit. The provider, [name], FNP is located in their home at time of visit.  I discussed the limitations, risks, security and privacy concerns of performing an evaluation and management service by telephone and the availability of in person appointments. I also discussed with the patient that there may be a patient responsible charge related to this service. The patient expressed understanding and agreed to proceed.  Subjective: PCP: [name], FNP  Chief Complaint  Patient presents with  . Medical Management of Chronic Issues   Weight Loss: Patient states her daughter told her to ask about weight loss today. She has been losing weight over the past 6-8 months. She has dropped clothing size from a 3X to a 2X. Her weight has decreased from 192 lbs in February to 162 lbs this month (November). She does not always eat breakfast due to having to be at dialysis by 6:30 AM but she always eats lunch and dinner. At lunch time she is eating things like a hamburger with fries, a sandwich, or oodle and noodles. At dinner time she is eating a meat and vegetables. She has Boost at home but has not been drinking them as they are too sweet and she does not like the taste. She reports the only big change in her diet over the past year is that she is no longer eating a lot of junk food like potato chips. She saw her cardiologist and had a BMP, CBC, TSH, and magnesium level drawn which did not reveal any cause to her weight loss.   Dysphagia: when patient saw her cardiologist on 02/04/2019 she mentioned that she was having trouble swallowing but that she was not taking her Protonix 40 mg BID. The cardiologist prescribed Protonix 20 mg  QD which she has been taking. She reports today that it has helped her swallowing and she is doing much better.   Bowel Movements: patient is concerned that she does not have a bowel movement every morning. She states sometimes it is not until later in the day when she finally has her bowel movement. When she does, she is not straining, it is not hard, there is no pain, and she does not feel as if she is constipated.   Pain: patient has a prescription for Norco 5/325 due to arthritis pain. She is unable to take NSAIDS due to her kidneys. Tylenol and Tramadol were not effective for her. She only takes 1/2 tablet at a time and only takes it 1-2 times a day. Her last prescription of 30 tablets was filled on 11/09/2018. A controlled substance agreement has been signed and a blood drug screen was completed as patient is unable to urinate due to dialysis.  Anxiety: patient was weaned off of Ativan and started on Celexa. She feels she is doing well with this.  Depression screen [facility] 2/9 02/12/2019 11/09/2018 10/26/2018  Decreased Interest 0 0 0  Down, Depressed, Hopeless 0 0 0  PHQ - 2 Score 0 0 0  Altered sleeping 1 - 1  Tired, decreased energy 1 - 1  Change in appetite 0 - 0  Feeling bad or failure about yourself  1 - 2  Trouble concentrating 1 -  0  Moving slowly or fidgety/restless 1 - 0  Suicidal thoughts 0 - 0  PHQ-9 Score 5 - 4  Difficult doing work/chores - - Not difficult at all   GAD 7 : Generalized Anxiety Score 02/12/2019 10/26/2018  Nervous, Anxious, on Edge 0 0  Control/stop worrying 0 0  Worry too much - different things 1 0  Trouble relaxing 1 1  Restless 1 2  Easily annoyed or irritable 1 2  Afraid - awful might happen 1 0  Total GAD 7 Score 5 5  Anxiety Difficulty Somewhat difficult Not difficult at all    ROS: Per HPI  Current Outpatient Medications:  .  acetaminophen (TYLENOL) 500 MG tablet, Take 2 tablets (1,000 mg total) by mouth every 8 (eight) hours as needed for mild  pain or headache., Disp: 30 tablet, Rfl: 0 .  albuterol (VENTOLIN HFA) 108 (90 Base) MCG/ACT inhaler, Inhale 2 puffs into the lungs every 6 (six) hours as needed for wheezing or shortness of breath., Disp: 18 g, Rfl: 2 .  alendronate (FOSAMAX) 70 MG tablet, Take 1 tablet (70 mg total) by mouth every Thursday., Disp: 12 tablet, Rfl: 3 .  aspirin EC 81 MG tablet, Take 81 mg by mouth daily., Disp: , Rfl:  .  budesonide-formoterol (SYMBICORT) 80-4.5 MCG/ACT inhaler, Inhale 2 puffs into the lungs 2 (two) times daily., Disp: 3 Inhaler, Rfl: 4 .  cinacalcet (SENSIPAR) 30 MG tablet, Take 1 tablet (30 mg total) by mouth every Monday, Wednesday, and Friday., Disp: 60 tablet, Rfl: 0 .  citalopram (CELEXA) 40 MG tablet, Take 1 tablet (40 mg total) by mouth daily., Disp: 90 tablet, Rfl: 1 .  colchicine 0.6 MG tablet, Take 2 tablets by mouth at onset of gout flare and then 1 tablet 1 hour later. Use as needed., Disp: 15 tablet, Rfl: 1 .  docusate calcium (SURFAK) 240 MG capsule, Take 1 capsule (240 mg total) by mouth daily., Disp: 90 capsule, Rfl: 1 .  doxercalciferol (HECTOROL) 4 MCG/2ML injection, Inject 0.5 mLs (1 mcg total) into the vein every Monday, Wednesday, and Friday with hemodialysis., Disp: 2 mL, Rfl: 0 .  ezetimibe (ZETIA) 10 MG tablet, Take 1 tablet (10 mg total) by mouth daily., Disp: 90 tablet, Rfl: 3 .  famotidine (PEPCID) 20 MG tablet, TAKE 1 TABLET BY MOUTH TWICE A DAY, Disp: 180 tablet, Rfl: 1 .  HYDROcodone-acetaminophen (NORCO/VICODIN) 5-325 MG tablet, Take 1 tablet by mouth every 6 (six) hours as needed for moderate pain., Disp: 30 tablet, Rfl: 0 .  HYDROcodone-acetaminophen (NORCO/VICODIN) 5-325 MG tablet, Take 1 tablet by mouth every 6 (six) hours as needed for moderate pain., Disp: 30 tablet, Rfl: 0 .  levothyroxine (SYNTHROID) 100 MCG tablet, Take 1 tablet (100 mcg total) by mouth daily., Disp: 90 tablet, Rfl: 3 .  Multiple Vitamin (MULTIVITAMIN) tablet, Take 1 tablet by mouth daily.,  Disp: , Rfl:  .  multivitamin (RENA-VIT) TABS tablet, Take 1 tablet by mouth daily., Disp: , Rfl:  .  nystatin cream (MYCOSTATIN), Apply 1 application topically 2 (two) times daily as needed for dry skin., Disp: 30 g, Rfl: 0 .  OXYGEN, Inhale 3 L into the lungs continuous., Disp: , Rfl:  .  pantoprazole (PROTONIX) 20 MG tablet, Take 1 tablet (20 mg total) by mouth daily., Disp: 30 tablet, Rfl: 6 .  pramipexole (MIRAPEX) 0.125 MG tablet, TAKE 1 TABLET (0.125 MG TOTAL) BY MOUTH AT BEDTIME., Disp: 90 tablet, Rfl: 1 .  Vitamin D, Ergocalciferol, (DRISDOL) 1.25  MG (50000 UT) CAPS capsule, Take 1 capsule (50,000 Units total) by mouth every Monday., Disp: 12 capsule, Rfl: 3  Allergies  Allergen Reactions  . Amoxicillin Rash    Has patient had a PCN reaction causing immediate rash, facial/tongue/throat swelling, SOB or lightheadedness with hypotension:YES Has patient had a PCN reaction causing severe rash involving mucus membranes or skin necrosis: Yes Has patient had a PCN reaction that required hospitalization No Has patient had a PCN reaction occurring within the last 10 years: Yes If all of the above answers are "NO", then may proceed with Cephalosporin use.   . Sulfa Drugs Cross Reactors Hives and Itching  . Percocet [Oxycodone-Acetaminophen] Itching and Rash    Did not happen last time she took it  . Sulfa Antibiotics Hives   Past Medical History:  Diagnosis Date  . Acute respiratory failure ([place]) 05/2017  . Anemia   . Anxiety   . Arthritis   . COPD (chronic obstructive pulmonary disease) ([place])   . Depression   . Diabetes mellitus    "diet controlled"  . ESRD (end stage renal disease) ([place])    dialysis MWF  . GERD (gastroesophageal reflux disease)   . Gout   . History of blood transfusion   . HLD (hyperlipidemia)   . HOH (hard of hearing)    left ear  . Hypertension    hypotensive -since starting dialysis  . Hypothyroidism   . MRSA (methicillin resistant staph aureus) culture  positive 06/01/2017  . PAF (paroxysmal atrial fibrillation) ([place])    a. Echo 11/16:  Mild LVH, EF 55-60%, normal wall motion, MAC, mild MR, severe LAE (49 ml/m2), mild RVE, normal RVSF, mild RAE, mild TR, PASP 24 mmHg;  CHADS2-VASc: 4 >> Coumadin followed by PCP  . Pneumonia   . Renal disorder   . 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 Wears dentures   . Wears glasses     Observations/Objective: A&O  No respiratory distress or wheezing audible over the phone Mood, judgement, and thought processes all WNL  Assessment and Plan: 1. Weight loss - Discussed I did not feel she should be drinking Boost supplement as she is eating regular meals. Weight loss in part could be due to her cutting out junk food. Labs reassuring. I did encourage her to discuss with nephrologist as well.   2. Gastroesophageal reflux disease without esophagitis - Well controlled on current regimen.   3. ESRD (end stage renal disease) on dialysis ([place]) - Dialysis MWF.   4-5. Arthritis/Chronic midline thoracic back pain - Controlled substance agreement in place. Drug screen with no concerns and within the last year. Refills sent for Norco 5/325.   6-7. Anxiety/Reactive depression - Well controlled on current regimen.   8. Age-related osteoporosis without current pathological fracture/Postmenopausal estrogen deficiency - DG [facility] DEXA; Future   Follow Up Instructions:  I discussed the assessment and treatment plan with the patient. The patient was provided an opportunity to ask questions and all were answered. The patient agreed with the plan and demonstrated an understanding of the instructions.   The patient was advised to call back or seek an in-person evaluation if the symptoms worsen or if the condition fails to improve as anticipated.  The above assessment and management plan was discussed with the patient. The patient verbalized understanding of and has agreed to the management plan. Patient is aware  to call the clinic if symptoms persist or worsen. Patient is aware when to return to the clinic for  a follow-up visit. Patient educated on when it is appropriate to go to the emergency department.   Time call ended: 1:52 PM  I provided 32 minutes of non-face-to-face time during this encounter.  [name], MSN, APRN, FNP-C [facility] 02/13/19

## 2019-02-13 ENCOUNTER — Encounter: Payer: Self-pay | Admitting: Family Medicine

## 2019-02-13 DIAGNOSIS — D631 Anemia in chronic kidney disease: Secondary | ICD-10-CM | POA: Diagnosis not present

## 2019-02-13 DIAGNOSIS — Z992 Dependence on renal dialysis: Secondary | ICD-10-CM | POA: Diagnosis not present

## 2019-02-13 DIAGNOSIS — Z4901 Encounter for fitting and adjustment of extracorporeal dialysis catheter: Secondary | ICD-10-CM | POA: Diagnosis not present

## 2019-02-13 DIAGNOSIS — N186 End stage renal disease: Secondary | ICD-10-CM | POA: Diagnosis not present

## 2019-02-13 DIAGNOSIS — D689 Coagulation defect, unspecified: Secondary | ICD-10-CM | POA: Diagnosis not present

## 2019-02-13 DIAGNOSIS — N2581 Secondary hyperparathyroidism of renal origin: Secondary | ICD-10-CM | POA: Diagnosis not present

## 2019-02-14 DIAGNOSIS — G8929 Other chronic pain: Secondary | ICD-10-CM | POA: Insufficient documentation

## 2019-02-14 DIAGNOSIS — M1990 Unspecified osteoarthritis, unspecified site: Secondary | ICD-10-CM | POA: Insufficient documentation

## 2019-02-14 MED ORDER — HYDROCODONE-ACETAMINOPHEN 5-325 MG PO TABS: 1.0000 | ORAL_TABLET | Freq: Four times a day (QID) | ORAL | 0 refills | Status: DC | PRN

## 2019-02-14 NOTE — Telephone Encounter (Signed)
Opened in error

## 2019-02-15 DIAGNOSIS — D689 Coagulation defect, unspecified: Secondary | ICD-10-CM | POA: Diagnosis not present

## 2019-02-15 DIAGNOSIS — Z992 Dependence on renal dialysis: Secondary | ICD-10-CM | POA: Diagnosis not present

## 2019-02-15 DIAGNOSIS — N2581 Secondary hyperparathyroidism of renal origin: Secondary | ICD-10-CM | POA: Diagnosis not present

## 2019-02-15 DIAGNOSIS — D631 Anemia in chronic kidney disease: Secondary | ICD-10-CM | POA: Diagnosis not present

## 2019-02-15 DIAGNOSIS — Z4901 Encounter for fitting and adjustment of extracorporeal dialysis catheter: Secondary | ICD-10-CM | POA: Diagnosis not present

## 2019-02-15 DIAGNOSIS — N186 End stage renal disease: Secondary | ICD-10-CM | POA: Diagnosis not present

## 2019-02-18 DIAGNOSIS — D689 Coagulation defect, unspecified: Secondary | ICD-10-CM | POA: Diagnosis not present

## 2019-02-18 DIAGNOSIS — Z4901 Encounter for fitting and adjustment of extracorporeal dialysis catheter: Secondary | ICD-10-CM | POA: Diagnosis not present

## 2019-02-18 DIAGNOSIS — N186 End stage renal disease: Secondary | ICD-10-CM | POA: Diagnosis not present

## 2019-02-18 DIAGNOSIS — N2581 Secondary hyperparathyroidism of renal origin: Secondary | ICD-10-CM | POA: Diagnosis not present

## 2019-02-18 DIAGNOSIS — D631 Anemia in chronic kidney disease: Secondary | ICD-10-CM | POA: Diagnosis not present

## 2019-02-18 DIAGNOSIS — Z992 Dependence on renal dialysis: Secondary | ICD-10-CM | POA: Diagnosis not present

## 2019-02-20 DIAGNOSIS — N2581 Secondary hyperparathyroidism of renal origin: Secondary | ICD-10-CM | POA: Diagnosis not present

## 2019-02-20 DIAGNOSIS — D631 Anemia in chronic kidney disease: Secondary | ICD-10-CM | POA: Diagnosis not present

## 2019-02-20 DIAGNOSIS — Z4901 Encounter for fitting and adjustment of extracorporeal dialysis catheter: Secondary | ICD-10-CM | POA: Diagnosis not present

## 2019-02-20 DIAGNOSIS — D689 Coagulation defect, unspecified: Secondary | ICD-10-CM | POA: Diagnosis not present

## 2019-02-20 DIAGNOSIS — N186 End stage renal disease: Secondary | ICD-10-CM | POA: Diagnosis not present

## 2019-02-20 DIAGNOSIS — Z992 Dependence on renal dialysis: Secondary | ICD-10-CM | POA: Diagnosis not present

## 2019-02-21 DIAGNOSIS — J449 Chronic obstructive pulmonary disease, unspecified: Secondary | ICD-10-CM | POA: Diagnosis not present

## 2019-02-22 DIAGNOSIS — N186 End stage renal disease: Secondary | ICD-10-CM | POA: Diagnosis not present

## 2019-02-22 DIAGNOSIS — Z992 Dependence on renal dialysis: Secondary | ICD-10-CM | POA: Diagnosis not present

## 2019-02-22 DIAGNOSIS — N2581 Secondary hyperparathyroidism of renal origin: Secondary | ICD-10-CM | POA: Diagnosis not present

## 2019-02-22 DIAGNOSIS — D631 Anemia in chronic kidney disease: Secondary | ICD-10-CM | POA: Diagnosis not present

## 2019-02-22 DIAGNOSIS — Z4901 Encounter for fitting and adjustment of extracorporeal dialysis catheter: Secondary | ICD-10-CM | POA: Diagnosis not present

## 2019-02-22 DIAGNOSIS — D689 Coagulation defect, unspecified: Secondary | ICD-10-CM | POA: Diagnosis not present

## 2019-02-24 DIAGNOSIS — Z992 Dependence on renal dialysis: Secondary | ICD-10-CM | POA: Diagnosis not present

## 2019-02-24 DIAGNOSIS — N186 End stage renal disease: Secondary | ICD-10-CM | POA: Diagnosis not present

## 2019-02-24 DIAGNOSIS — N2581 Secondary hyperparathyroidism of renal origin: Secondary | ICD-10-CM | POA: Diagnosis not present

## 2019-02-24 DIAGNOSIS — Z4901 Encounter for fitting and adjustment of extracorporeal dialysis catheter: Secondary | ICD-10-CM | POA: Diagnosis not present

## 2019-02-24 DIAGNOSIS — D631 Anemia in chronic kidney disease: Secondary | ICD-10-CM | POA: Diagnosis not present

## 2019-02-24 DIAGNOSIS — D689 Coagulation defect, unspecified: Secondary | ICD-10-CM | POA: Diagnosis not present

## 2019-02-26 DIAGNOSIS — N2581 Secondary hyperparathyroidism of renal origin: Secondary | ICD-10-CM | POA: Diagnosis not present

## 2019-02-26 DIAGNOSIS — N186 End stage renal disease: Secondary | ICD-10-CM | POA: Diagnosis not present

## 2019-02-26 DIAGNOSIS — Z992 Dependence on renal dialysis: Secondary | ICD-10-CM | POA: Diagnosis not present

## 2019-02-26 DIAGNOSIS — D689 Coagulation defect, unspecified: Secondary | ICD-10-CM | POA: Diagnosis not present

## 2019-02-26 DIAGNOSIS — Z4901 Encounter for fitting and adjustment of extracorporeal dialysis catheter: Secondary | ICD-10-CM | POA: Diagnosis not present

## 2019-02-26 DIAGNOSIS — D631 Anemia in chronic kidney disease: Secondary | ICD-10-CM | POA: Diagnosis not present

## 2019-02-28 DIAGNOSIS — I480 Paroxysmal atrial fibrillation: Secondary | ICD-10-CM | POA: Diagnosis not present

## 2019-03-01 DIAGNOSIS — N186 End stage renal disease: Secondary | ICD-10-CM | POA: Diagnosis not present

## 2019-03-01 DIAGNOSIS — D689 Coagulation defect, unspecified: Secondary | ICD-10-CM | POA: Diagnosis not present

## 2019-03-01 DIAGNOSIS — D631 Anemia in chronic kidney disease: Secondary | ICD-10-CM | POA: Diagnosis not present

## 2019-03-01 DIAGNOSIS — N2581 Secondary hyperparathyroidism of renal origin: Secondary | ICD-10-CM | POA: Diagnosis not present

## 2019-03-01 DIAGNOSIS — Z992 Dependence on renal dialysis: Secondary | ICD-10-CM | POA: Diagnosis not present

## 2019-03-01 DIAGNOSIS — Z4901 Encounter for fitting and adjustment of extracorporeal dialysis catheter: Secondary | ICD-10-CM | POA: Diagnosis not present

## 2019-03-04 DIAGNOSIS — D631 Anemia in chronic kidney disease: Secondary | ICD-10-CM | POA: Diagnosis not present

## 2019-03-04 DIAGNOSIS — N186 End stage renal disease: Secondary | ICD-10-CM | POA: Diagnosis not present

## 2019-03-04 DIAGNOSIS — E1122 Type 2 diabetes mellitus with diabetic chronic kidney disease: Secondary | ICD-10-CM | POA: Diagnosis not present

## 2019-03-04 DIAGNOSIS — D689 Coagulation defect, unspecified: Secondary | ICD-10-CM | POA: Diagnosis not present

## 2019-03-04 DIAGNOSIS — Z4901 Encounter for fitting and adjustment of extracorporeal dialysis catheter: Secondary | ICD-10-CM | POA: Diagnosis not present

## 2019-03-04 DIAGNOSIS — Z992 Dependence on renal dialysis: Secondary | ICD-10-CM | POA: Diagnosis not present

## 2019-03-04 DIAGNOSIS — N2581 Secondary hyperparathyroidism of renal origin: Secondary | ICD-10-CM | POA: Diagnosis not present

## 2019-03-06 DIAGNOSIS — N2581 Secondary hyperparathyroidism of renal origin: Secondary | ICD-10-CM | POA: Diagnosis not present

## 2019-03-06 DIAGNOSIS — N186 End stage renal disease: Secondary | ICD-10-CM | POA: Diagnosis not present

## 2019-03-06 DIAGNOSIS — Z992 Dependence on renal dialysis: Secondary | ICD-10-CM | POA: Diagnosis not present

## 2019-03-06 DIAGNOSIS — Z4901 Encounter for fitting and adjustment of extracorporeal dialysis catheter: Secondary | ICD-10-CM | POA: Diagnosis not present

## 2019-03-06 DIAGNOSIS — D689 Coagulation defect, unspecified: Secondary | ICD-10-CM | POA: Diagnosis not present

## 2019-03-07 DIAGNOSIS — I70203 Unspecified atherosclerosis of native arteries of extremities, bilateral legs: Secondary | ICD-10-CM | POA: Diagnosis not present

## 2019-03-07 DIAGNOSIS — L84 Corns and callosities: Secondary | ICD-10-CM | POA: Diagnosis not present

## 2019-03-07 DIAGNOSIS — B351 Tinea unguium: Secondary | ICD-10-CM | POA: Diagnosis not present

## 2019-03-07 DIAGNOSIS — M79676 Pain in unspecified toe(s): Secondary | ICD-10-CM | POA: Diagnosis not present

## 2019-03-08 DIAGNOSIS — N186 End stage renal disease: Secondary | ICD-10-CM | POA: Diagnosis not present

## 2019-03-08 DIAGNOSIS — D689 Coagulation defect, unspecified: Secondary | ICD-10-CM | POA: Diagnosis not present

## 2019-03-08 DIAGNOSIS — Z4901 Encounter for fitting and adjustment of extracorporeal dialysis catheter: Secondary | ICD-10-CM | POA: Diagnosis not present

## 2019-03-08 DIAGNOSIS — N2581 Secondary hyperparathyroidism of renal origin: Secondary | ICD-10-CM | POA: Diagnosis not present

## 2019-03-08 DIAGNOSIS — Z992 Dependence on renal dialysis: Secondary | ICD-10-CM | POA: Diagnosis not present

## 2019-03-11 DIAGNOSIS — N2581 Secondary hyperparathyroidism of renal origin: Secondary | ICD-10-CM | POA: Diagnosis not present

## 2019-03-11 DIAGNOSIS — Z992 Dependence on renal dialysis: Secondary | ICD-10-CM | POA: Diagnosis not present

## 2019-03-11 DIAGNOSIS — D689 Coagulation defect, unspecified: Secondary | ICD-10-CM | POA: Diagnosis not present

## 2019-03-11 DIAGNOSIS — Z4901 Encounter for fitting and adjustment of extracorporeal dialysis catheter: Secondary | ICD-10-CM | POA: Diagnosis not present

## 2019-03-11 DIAGNOSIS — N186 End stage renal disease: Secondary | ICD-10-CM | POA: Diagnosis not present

## 2019-03-13 DIAGNOSIS — N186 End stage renal disease: Secondary | ICD-10-CM | POA: Diagnosis not present

## 2019-03-13 DIAGNOSIS — D689 Coagulation defect, unspecified: Secondary | ICD-10-CM | POA: Diagnosis not present

## 2019-03-13 DIAGNOSIS — N2581 Secondary hyperparathyroidism of renal origin: Secondary | ICD-10-CM | POA: Diagnosis not present

## 2019-03-13 DIAGNOSIS — Z992 Dependence on renal dialysis: Secondary | ICD-10-CM | POA: Diagnosis not present

## 2019-03-13 DIAGNOSIS — Z4901 Encounter for fitting and adjustment of extracorporeal dialysis catheter: Secondary | ICD-10-CM | POA: Diagnosis not present

## 2019-03-15 DIAGNOSIS — Z4901 Encounter for fitting and adjustment of extracorporeal dialysis catheter: Secondary | ICD-10-CM | POA: Diagnosis not present

## 2019-03-15 DIAGNOSIS — N186 End stage renal disease: Secondary | ICD-10-CM | POA: Diagnosis not present

## 2019-03-15 DIAGNOSIS — D689 Coagulation defect, unspecified: Secondary | ICD-10-CM | POA: Diagnosis not present

## 2019-03-15 DIAGNOSIS — Z992 Dependence on renal dialysis: Secondary | ICD-10-CM | POA: Diagnosis not present

## 2019-03-15 DIAGNOSIS — N2581 Secondary hyperparathyroidism of renal origin: Secondary | ICD-10-CM | POA: Diagnosis not present

## 2019-03-18 DIAGNOSIS — Z992 Dependence on renal dialysis: Secondary | ICD-10-CM | POA: Diagnosis not present

## 2019-03-18 DIAGNOSIS — N186 End stage renal disease: Secondary | ICD-10-CM | POA: Diagnosis not present

## 2019-03-18 DIAGNOSIS — Z4901 Encounter for fitting and adjustment of extracorporeal dialysis catheter: Secondary | ICD-10-CM | POA: Diagnosis not present

## 2019-03-18 DIAGNOSIS — D689 Coagulation defect, unspecified: Secondary | ICD-10-CM | POA: Diagnosis not present

## 2019-03-18 DIAGNOSIS — N2581 Secondary hyperparathyroidism of renal origin: Secondary | ICD-10-CM | POA: Diagnosis not present

## 2019-03-20 DIAGNOSIS — J449 Chronic obstructive pulmonary disease, unspecified: Secondary | ICD-10-CM | POA: Diagnosis not present

## 2019-03-20 DIAGNOSIS — D689 Coagulation defect, unspecified: Secondary | ICD-10-CM | POA: Diagnosis not present

## 2019-03-20 DIAGNOSIS — N2581 Secondary hyperparathyroidism of renal origin: Secondary | ICD-10-CM | POA: Diagnosis not present

## 2019-03-20 DIAGNOSIS — Z992 Dependence on renal dialysis: Secondary | ICD-10-CM | POA: Diagnosis not present

## 2019-03-20 DIAGNOSIS — N186 End stage renal disease: Secondary | ICD-10-CM | POA: Diagnosis not present

## 2019-03-20 DIAGNOSIS — Z4901 Encounter for fitting and adjustment of extracorporeal dialysis catheter: Secondary | ICD-10-CM | POA: Diagnosis not present

## 2019-03-22 DIAGNOSIS — N186 End stage renal disease: Secondary | ICD-10-CM | POA: Diagnosis not present

## 2019-03-22 DIAGNOSIS — Z4901 Encounter for fitting and adjustment of extracorporeal dialysis catheter: Secondary | ICD-10-CM | POA: Diagnosis not present

## 2019-03-22 DIAGNOSIS — N2581 Secondary hyperparathyroidism of renal origin: Secondary | ICD-10-CM | POA: Diagnosis not present

## 2019-03-22 DIAGNOSIS — D689 Coagulation defect, unspecified: Secondary | ICD-10-CM | POA: Diagnosis not present

## 2019-03-22 DIAGNOSIS — Z992 Dependence on renal dialysis: Secondary | ICD-10-CM | POA: Diagnosis not present

## 2019-03-25 DIAGNOSIS — N2581 Secondary hyperparathyroidism of renal origin: Secondary | ICD-10-CM | POA: Diagnosis not present

## 2019-03-25 DIAGNOSIS — Z4901 Encounter for fitting and adjustment of extracorporeal dialysis catheter: Secondary | ICD-10-CM | POA: Diagnosis not present

## 2019-03-25 DIAGNOSIS — D689 Coagulation defect, unspecified: Secondary | ICD-10-CM | POA: Diagnosis not present

## 2019-03-25 DIAGNOSIS — Z992 Dependence on renal dialysis: Secondary | ICD-10-CM | POA: Diagnosis not present

## 2019-03-25 DIAGNOSIS — N186 End stage renal disease: Secondary | ICD-10-CM | POA: Diagnosis not present

## 2019-03-27 DIAGNOSIS — Z4901 Encounter for fitting and adjustment of extracorporeal dialysis catheter: Secondary | ICD-10-CM | POA: Diagnosis not present

## 2019-03-27 DIAGNOSIS — D689 Coagulation defect, unspecified: Secondary | ICD-10-CM | POA: Diagnosis not present

## 2019-03-27 DIAGNOSIS — N2581 Secondary hyperparathyroidism of renal origin: Secondary | ICD-10-CM | POA: Diagnosis not present

## 2019-03-27 DIAGNOSIS — N186 End stage renal disease: Secondary | ICD-10-CM | POA: Diagnosis not present

## 2019-03-27 DIAGNOSIS — Z992 Dependence on renal dialysis: Secondary | ICD-10-CM | POA: Diagnosis not present

## 2019-03-30 DIAGNOSIS — D689 Coagulation defect, unspecified: Secondary | ICD-10-CM | POA: Diagnosis not present

## 2019-03-30 DIAGNOSIS — I480 Paroxysmal atrial fibrillation: Secondary | ICD-10-CM | POA: Diagnosis not present

## 2019-03-30 DIAGNOSIS — Z992 Dependence on renal dialysis: Secondary | ICD-10-CM | POA: Diagnosis not present

## 2019-03-30 DIAGNOSIS — N2581 Secondary hyperparathyroidism of renal origin: Secondary | ICD-10-CM | POA: Diagnosis not present

## 2019-03-30 DIAGNOSIS — N186 End stage renal disease: Secondary | ICD-10-CM | POA: Diagnosis not present

## 2019-03-30 DIAGNOSIS — Z4901 Encounter for fitting and adjustment of extracorporeal dialysis catheter: Secondary | ICD-10-CM | POA: Diagnosis not present

## 2019-04-01 DIAGNOSIS — N186 End stage renal disease: Secondary | ICD-10-CM | POA: Diagnosis not present

## 2019-04-01 DIAGNOSIS — D689 Coagulation defect, unspecified: Secondary | ICD-10-CM | POA: Diagnosis not present

## 2019-04-01 DIAGNOSIS — N2581 Secondary hyperparathyroidism of renal origin: Secondary | ICD-10-CM | POA: Diagnosis not present

## 2019-04-01 DIAGNOSIS — Z4901 Encounter for fitting and adjustment of extracorporeal dialysis catheter: Secondary | ICD-10-CM | POA: Diagnosis not present

## 2019-04-01 DIAGNOSIS — Z992 Dependence on renal dialysis: Secondary | ICD-10-CM | POA: Diagnosis not present

## 2019-04-03 DIAGNOSIS — D689 Coagulation defect, unspecified: Secondary | ICD-10-CM | POA: Diagnosis not present

## 2019-04-03 DIAGNOSIS — N2581 Secondary hyperparathyroidism of renal origin: Secondary | ICD-10-CM | POA: Diagnosis not present

## 2019-04-03 DIAGNOSIS — Z992 Dependence on renal dialysis: Secondary | ICD-10-CM | POA: Diagnosis not present

## 2019-04-03 DIAGNOSIS — Z4901 Encounter for fitting and adjustment of extracorporeal dialysis catheter: Secondary | ICD-10-CM | POA: Diagnosis not present

## 2019-04-03 DIAGNOSIS — N186 End stage renal disease: Secondary | ICD-10-CM | POA: Diagnosis not present

## 2019-04-04 DIAGNOSIS — Z992 Dependence on renal dialysis: Secondary | ICD-10-CM | POA: Diagnosis not present

## 2019-04-04 DIAGNOSIS — N186 End stage renal disease: Secondary | ICD-10-CM | POA: Diagnosis not present

## 2019-04-04 DIAGNOSIS — E1122 Type 2 diabetes mellitus with diabetic chronic kidney disease: Secondary | ICD-10-CM | POA: Diagnosis not present

## 2019-04-06 DIAGNOSIS — Z4901 Encounter for fitting and adjustment of extracorporeal dialysis catheter: Secondary | ICD-10-CM | POA: Diagnosis not present

## 2019-04-06 DIAGNOSIS — D631 Anemia in chronic kidney disease: Secondary | ICD-10-CM | POA: Diagnosis not present

## 2019-04-06 DIAGNOSIS — Z992 Dependence on renal dialysis: Secondary | ICD-10-CM | POA: Diagnosis not present

## 2019-04-06 DIAGNOSIS — N2581 Secondary hyperparathyroidism of renal origin: Secondary | ICD-10-CM | POA: Diagnosis not present

## 2019-04-06 DIAGNOSIS — N186 End stage renal disease: Secondary | ICD-10-CM | POA: Diagnosis not present

## 2019-04-06 DIAGNOSIS — U071 COVID-19: Secondary | ICD-10-CM | POA: Diagnosis not present

## 2019-04-06 DIAGNOSIS — D689 Coagulation defect, unspecified: Secondary | ICD-10-CM | POA: Diagnosis not present

## 2019-04-09 DIAGNOSIS — N186 End stage renal disease: Secondary | ICD-10-CM | POA: Diagnosis not present

## 2019-04-09 DIAGNOSIS — D631 Anemia in chronic kidney disease: Secondary | ICD-10-CM | POA: Diagnosis not present

## 2019-04-09 DIAGNOSIS — U071 COVID-19: Secondary | ICD-10-CM | POA: Diagnosis not present

## 2019-04-09 DIAGNOSIS — N2581 Secondary hyperparathyroidism of renal origin: Secondary | ICD-10-CM | POA: Diagnosis not present

## 2019-04-09 DIAGNOSIS — Z4901 Encounter for fitting and adjustment of extracorporeal dialysis catheter: Secondary | ICD-10-CM | POA: Diagnosis not present

## 2019-04-09 DIAGNOSIS — Z992 Dependence on renal dialysis: Secondary | ICD-10-CM | POA: Diagnosis not present

## 2019-04-09 DIAGNOSIS — D689 Coagulation defect, unspecified: Secondary | ICD-10-CM | POA: Diagnosis not present

## 2019-04-11 DIAGNOSIS — Z992 Dependence on renal dialysis: Secondary | ICD-10-CM | POA: Diagnosis not present

## 2019-04-11 DIAGNOSIS — N186 End stage renal disease: Secondary | ICD-10-CM | POA: Diagnosis not present

## 2019-04-11 DIAGNOSIS — Z4901 Encounter for fitting and adjustment of extracorporeal dialysis catheter: Secondary | ICD-10-CM | POA: Diagnosis not present

## 2019-04-11 DIAGNOSIS — U071 COVID-19: Secondary | ICD-10-CM | POA: Diagnosis not present

## 2019-04-11 DIAGNOSIS — N2581 Secondary hyperparathyroidism of renal origin: Secondary | ICD-10-CM | POA: Diagnosis not present

## 2019-04-11 DIAGNOSIS — D689 Coagulation defect, unspecified: Secondary | ICD-10-CM | POA: Diagnosis not present

## 2019-04-11 DIAGNOSIS — D631 Anemia in chronic kidney disease: Secondary | ICD-10-CM | POA: Diagnosis not present

## 2019-04-13 DIAGNOSIS — Z4901 Encounter for fitting and adjustment of extracorporeal dialysis catheter: Secondary | ICD-10-CM | POA: Diagnosis not present

## 2019-04-13 DIAGNOSIS — N2581 Secondary hyperparathyroidism of renal origin: Secondary | ICD-10-CM | POA: Diagnosis not present

## 2019-04-13 DIAGNOSIS — D631 Anemia in chronic kidney disease: Secondary | ICD-10-CM | POA: Diagnosis not present

## 2019-04-13 DIAGNOSIS — D689 Coagulation defect, unspecified: Secondary | ICD-10-CM | POA: Diagnosis not present

## 2019-04-13 DIAGNOSIS — Z992 Dependence on renal dialysis: Secondary | ICD-10-CM | POA: Diagnosis not present

## 2019-04-13 DIAGNOSIS — N186 End stage renal disease: Secondary | ICD-10-CM | POA: Diagnosis not present

## 2019-04-15 DIAGNOSIS — N186 End stage renal disease: Secondary | ICD-10-CM | POA: Diagnosis not present

## 2019-04-15 DIAGNOSIS — N2581 Secondary hyperparathyroidism of renal origin: Secondary | ICD-10-CM | POA: Diagnosis not present

## 2019-04-15 DIAGNOSIS — D689 Coagulation defect, unspecified: Secondary | ICD-10-CM | POA: Diagnosis not present

## 2019-04-15 DIAGNOSIS — D631 Anemia in chronic kidney disease: Secondary | ICD-10-CM | POA: Diagnosis not present

## 2019-04-15 DIAGNOSIS — Z992 Dependence on renal dialysis: Secondary | ICD-10-CM | POA: Diagnosis not present

## 2019-04-15 DIAGNOSIS — Z4901 Encounter for fitting and adjustment of extracorporeal dialysis catheter: Secondary | ICD-10-CM | POA: Diagnosis not present

## 2019-04-16 DIAGNOSIS — N2581 Secondary hyperparathyroidism of renal origin: Secondary | ICD-10-CM | POA: Diagnosis not present

## 2019-04-16 DIAGNOSIS — D689 Coagulation defect, unspecified: Secondary | ICD-10-CM | POA: Diagnosis not present

## 2019-04-16 DIAGNOSIS — D631 Anemia in chronic kidney disease: Secondary | ICD-10-CM | POA: Diagnosis not present

## 2019-04-16 DIAGNOSIS — U071 COVID-19: Secondary | ICD-10-CM | POA: Diagnosis not present

## 2019-04-16 DIAGNOSIS — N186 End stage renal disease: Secondary | ICD-10-CM | POA: Diagnosis not present

## 2019-04-16 DIAGNOSIS — Z992 Dependence on renal dialysis: Secondary | ICD-10-CM | POA: Diagnosis not present

## 2019-04-16 DIAGNOSIS — Z4901 Encounter for fitting and adjustment of extracorporeal dialysis catheter: Secondary | ICD-10-CM | POA: Diagnosis not present

## 2019-04-18 DIAGNOSIS — N2581 Secondary hyperparathyroidism of renal origin: Secondary | ICD-10-CM | POA: Diagnosis not present

## 2019-04-18 DIAGNOSIS — U071 COVID-19: Secondary | ICD-10-CM | POA: Diagnosis not present

## 2019-04-18 DIAGNOSIS — D689 Coagulation defect, unspecified: Secondary | ICD-10-CM | POA: Diagnosis not present

## 2019-04-18 DIAGNOSIS — Z4901 Encounter for fitting and adjustment of extracorporeal dialysis catheter: Secondary | ICD-10-CM | POA: Diagnosis not present

## 2019-04-18 DIAGNOSIS — N186 End stage renal disease: Secondary | ICD-10-CM | POA: Diagnosis not present

## 2019-04-18 DIAGNOSIS — Z992 Dependence on renal dialysis: Secondary | ICD-10-CM | POA: Diagnosis not present

## 2019-04-18 DIAGNOSIS — D631 Anemia in chronic kidney disease: Secondary | ICD-10-CM | POA: Diagnosis not present

## 2019-04-19 DIAGNOSIS — J449 Chronic obstructive pulmonary disease, unspecified: Secondary | ICD-10-CM | POA: Diagnosis not present

## 2019-04-20 DIAGNOSIS — D631 Anemia in chronic kidney disease: Secondary | ICD-10-CM | POA: Diagnosis not present

## 2019-04-20 DIAGNOSIS — Z992 Dependence on renal dialysis: Secondary | ICD-10-CM | POA: Diagnosis not present

## 2019-04-20 DIAGNOSIS — Z4901 Encounter for fitting and adjustment of extracorporeal dialysis catheter: Secondary | ICD-10-CM | POA: Diagnosis not present

## 2019-04-20 DIAGNOSIS — D689 Coagulation defect, unspecified: Secondary | ICD-10-CM | POA: Diagnosis not present

## 2019-04-20 DIAGNOSIS — N186 End stage renal disease: Secondary | ICD-10-CM | POA: Diagnosis not present

## 2019-04-20 DIAGNOSIS — N2581 Secondary hyperparathyroidism of renal origin: Secondary | ICD-10-CM | POA: Diagnosis not present

## 2019-04-23 DIAGNOSIS — D631 Anemia in chronic kidney disease: Secondary | ICD-10-CM | POA: Diagnosis not present

## 2019-04-23 DIAGNOSIS — U071 COVID-19: Secondary | ICD-10-CM | POA: Diagnosis not present

## 2019-04-23 DIAGNOSIS — N186 End stage renal disease: Secondary | ICD-10-CM | POA: Diagnosis not present

## 2019-04-23 DIAGNOSIS — D689 Coagulation defect, unspecified: Secondary | ICD-10-CM | POA: Diagnosis not present

## 2019-04-23 DIAGNOSIS — Z992 Dependence on renal dialysis: Secondary | ICD-10-CM | POA: Diagnosis not present

## 2019-04-23 DIAGNOSIS — Z4901 Encounter for fitting and adjustment of extracorporeal dialysis catheter: Secondary | ICD-10-CM | POA: Diagnosis not present

## 2019-04-23 DIAGNOSIS — N2581 Secondary hyperparathyroidism of renal origin: Secondary | ICD-10-CM | POA: Diagnosis not present

## 2019-04-25 ENCOUNTER — Other Ambulatory Visit: Payer: Self-pay

## 2019-04-25 DIAGNOSIS — N186 End stage renal disease: Secondary | ICD-10-CM | POA: Diagnosis not present

## 2019-04-25 DIAGNOSIS — N2581 Secondary hyperparathyroidism of renal origin: Secondary | ICD-10-CM | POA: Diagnosis not present

## 2019-04-25 DIAGNOSIS — D631 Anemia in chronic kidney disease: Secondary | ICD-10-CM | POA: Diagnosis not present

## 2019-04-25 DIAGNOSIS — Z992 Dependence on renal dialysis: Secondary | ICD-10-CM | POA: Diagnosis not present

## 2019-04-25 DIAGNOSIS — Z4901 Encounter for fitting and adjustment of extracorporeal dialysis catheter: Secondary | ICD-10-CM | POA: Diagnosis not present

## 2019-04-25 DIAGNOSIS — D689 Coagulation defect, unspecified: Secondary | ICD-10-CM | POA: Diagnosis not present

## 2019-04-25 DIAGNOSIS — U071 COVID-19: Secondary | ICD-10-CM | POA: Diagnosis not present

## 2019-04-26 ENCOUNTER — Ambulatory Visit (INDEPENDENT_AMBULATORY_CARE_PROVIDER_SITE_OTHER): Payer: Medicare Other | Admitting: Family Medicine

## 2019-04-26 ENCOUNTER — Encounter: Payer: Self-pay | Admitting: Family Medicine

## 2019-04-26 ENCOUNTER — Ambulatory Visit (INDEPENDENT_AMBULATORY_CARE_PROVIDER_SITE_OTHER): Payer: Medicare Other

## 2019-04-26 VITALS — BP 131/80 | HR 79 | Temp 98.0°F | Ht 65.0 in | Wt 159.2 lb

## 2019-04-26 DIAGNOSIS — M19031 Primary osteoarthritis, right wrist: Secondary | ICD-10-CM | POA: Diagnosis not present

## 2019-04-26 DIAGNOSIS — M25531 Pain in right wrist: Secondary | ICD-10-CM

## 2019-04-26 NOTE — Progress Notes (Signed)
Assessment & Plan:  1. Right wrist pain - Urgent referral to Dr. Micheline Chapman for ultrasound to r/o tendon rupture. Patient discussed with Dr. Lajuana Ripple as well. Encouraged Tylenol for pain as needed and application of ice/cool packs.  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 This could certainly be painful. Lesser osteoarthritis of the radiocarpal joint. Widening of the space between the lunate and scaphoid could indicate ligament tear, likely chronic.  Extensive regional arterial calcification. - Ambulatory referral to Sports Medicine   Follow up plan: Return as scheduled.  [name], MSN, APRN, FNP-C [facility]  Subjective:   Patient ID: [name], female    DOB: 1941-03-15, 79 y.o.   MRN: [id number]  HPI: [name] is a 79 y.o. female presenting on 04/26/2019 for Wrist Pain (right- Patient states she moved her walker x 1 week ago and felt a pop in her right wrist )  Patient reports 8 days ago she was lifting her rolling walker over a stair and felt something pop in her right wrist. She did not hit her wrist on anything. Since she has been experiencing weakness with her wrist, as well as bruising/erythema  and warmth of the area. She has a history of a ruptured tendon the left arm per her daughter that accompanies her.    ROS: Negative unless specifically indicated above in HPI.   Relevant past medical history reviewed and updated as indicated.   Allergies and medications reviewed and updated.   Current Outpatient Medications:  .  acetaminophen (TYLENOL) 500 MG tablet, Take 2 tablets (1,000 mg total) by mouth every 8 (eight) hours as needed for mild pain or headache., Disp: 30 tablet, Rfl: 0 .  albuterol (VENTOLIN HFA) 108 (90 Base) MCG/ACT inhaler, Inhale 2 puffs into the lungs every 6 (six) hours as needed for wheezing or shortness of breath., Disp: 18  g, Rfl: 2 .  alendronate (FOSAMAX) 70 MG tablet, Take 1 tablet (70 mg total) by mouth every Thursday., Disp: 12 tablet, Rfl: 3 .  aspirin EC 81 MG tablet, Take 81 mg by mouth daily., Disp: , Rfl:  .  budesonide-formoterol (SYMBICORT) 80-4.5 MCG/ACT inhaler, Inhale 2 puffs into the lungs 2 (two) times daily., Disp: 3 Inhaler, Rfl: 4 .  cinacalcet (SENSIPAR) 30 MG tablet, Take 1 tablet (30 mg total) by mouth every Monday, Wednesday, and Friday., Disp: 60 tablet, Rfl: 0 .  citalopram (CELEXA) 40 MG tablet, Take 1 tablet (40 mg total) by mouth daily., Disp: 90 tablet, Rfl: 1 .  colchicine 0.6 MG tablet, Take 2 tablets by mouth at onset of gout flare and then 1 tablet 1 hour later. Use as needed., Disp: 15 tablet, Rfl: 1 .  docusate calcium (SURFAK) 240 MG capsule, Take 1 capsule (240 mg total) by mouth daily., Disp: 90 capsule, Rfl: 1 .  doxercalciferol (HECTOROL) 4 MCG/2ML injection, Inject 0.5 mLs (1 mcg total) into the vein every Monday, Wednesday, and Friday with hemodialysis., Disp: 2 mL, Rfl: 0 .  ezetimibe (ZETIA) 10 MG tablet, Take 1 tablet (10 mg total) by mouth daily., Disp: 90 tablet, Rfl: 3 .  famotidine (PEPCID) 20 MG tablet, TAKE 1 TABLET BY MOUTH TWICE A DAY, Disp: 180 tablet, Rfl: 1 .  HYDROcodone-acetaminophen (NORCO/VICODIN) 5-325 MG tablet, Take 1 tablet by mouth every 6 (six) hours as needed for moderate pain., Disp: 30 tablet, Rfl: 0 .  HYDROcodone-acetaminophen (NORCO/VICODIN) 5-325  MG tablet, Take 1 tablet by mouth every 6 (six) hours as needed for moderate pain., Disp: 30 tablet, Rfl: 0 .  HYDROcodone-acetaminophen (NORCO/VICODIN) 5-325 MG tablet, Take 1 tablet by mouth every 6 (six) hours as needed for moderate pain., Disp: 30 tablet, Rfl: 0 .  levothyroxine (SYNTHROID) 100 MCG tablet, Take 1 tablet (100 mcg total) by mouth daily., Disp: 90 tablet, Rfl: 3 .  Multiple Vitamin (MULTIVITAMIN) tablet, Take 1 tablet by mouth daily., Disp: , Rfl:  .  multivitamin (RENA-VIT) TABS  tablet, Take 1 tablet by mouth daily., Disp: , Rfl:  .  nystatin cream (MYCOSTATIN), Apply 1 application topically 2 (two) times daily as needed for dry skin., Disp: 30 g, Rfl: 0 .  OXYGEN, Inhale 3 L into the lungs continuous., Disp: , Rfl:  .  pantoprazole (PROTONIX) 20 MG tablet, Take 1 tablet (20 mg total) by mouth daily., Disp: 30 tablet, Rfl: 6 .  pramipexole (MIRAPEX) 0.125 MG tablet, TAKE 1 TABLET (0.125 MG TOTAL) BY MOUTH AT BEDTIME., Disp: 90 tablet, Rfl: 1 .  Vitamin D, Ergocalciferol, (DRISDOL) 1.25 MG (50000 UT) CAPS capsule, Take 1 capsule (50,000 Units total) by mouth every Monday., Disp: 12 capsule, Rfl: 3  Allergies  Allergen Reactions  . Amoxicillin Rash    Has patient had a PCN reaction causing immediate rash, facial/tongue/throat swelling, SOB or lightheadedness with hypotension:YES Has patient had a PCN reaction causing severe rash involving mucus membranes or skin necrosis: Yes Has patient had a PCN reaction that required hospitalization No Has patient had a PCN reaction occurring within the last 10 years: Yes If all of the above answers are "NO", then may proceed with Cephalosporin use.   . Sulfa Drugs Cross Reactors Hives and Itching  . Percocet [Oxycodone-Acetaminophen] Itching and Rash    Did not happen last time she took it  . Sulfa Antibiotics Hives    Objective:   BP 131/80   Pulse 79   Temp 98 F (36.7 C) (Temporal)   Ht 5\' 5"  (1.651 m)   Wt 159 lb 3.2 oz (72.2 kg)   SpO2 98%   BMI 26.49 kg/m    Physical Exam Vitals reviewed.  Constitutional:      General: She is not in acute distress.    Appearance: Normal appearance. She is not ill-appearing, toxic-appearing or diaphoretic.  HENT:     Head: Normocephalic and atraumatic.  Eyes:     General: No scleral icterus.       Right eye: No discharge.        Left eye: No discharge.     Conjunctiva/sclera: Conjunctivae normal.  Cardiovascular:     Rate and Rhythm: Normal rate.  Pulmonary:      Effort: Pulmonary effort is normal. No respiratory distress.  Musculoskeletal:        General: Normal range of motion.     Right wrist: Tenderness present. No bony tenderness. Normal range of motion. Normal pulse.     Cervical back: Normal range of motion.  Skin:    General: Skin is warm and dry.     Capillary Refill: Capillary refill takes less than 2 seconds.     Findings: Bruising (right wrist/forearm ~4" x 1.5") present.  Neurological:     General: No focal deficit present.     Mental Status: She is alert and oriented to person, place, and time. Mental status is at baseline.  Psychiatric:        Mood and Affect: Mood normal.  Behavior: Behavior normal.        Thought Content: Thought content normal.        Judgment: Judgment normal.

## 2019-04-27 DIAGNOSIS — M1990 Unspecified osteoarthritis, unspecified site: Secondary | ICD-10-CM | POA: Diagnosis not present

## 2019-04-27 DIAGNOSIS — Z992 Dependence on renal dialysis: Secondary | ICD-10-CM | POA: Diagnosis not present

## 2019-04-27 DIAGNOSIS — I480 Paroxysmal atrial fibrillation: Secondary | ICD-10-CM | POA: Diagnosis not present

## 2019-04-27 DIAGNOSIS — I872 Venous insufficiency (chronic) (peripheral): Secondary | ICD-10-CM | POA: Diagnosis not present

## 2019-04-27 DIAGNOSIS — J449 Chronic obstructive pulmonary disease, unspecified: Secondary | ICD-10-CM | POA: Diagnosis not present

## 2019-04-27 DIAGNOSIS — E1151 Type 2 diabetes mellitus with diabetic peripheral angiopathy without gangrene: Secondary | ICD-10-CM | POA: Diagnosis not present

## 2019-04-27 DIAGNOSIS — M1030 Gout due to renal impairment, unspecified site: Secondary | ICD-10-CM | POA: Diagnosis not present

## 2019-04-27 DIAGNOSIS — Z4901 Encounter for fitting and adjustment of extracorporeal dialysis catheter: Secondary | ICD-10-CM | POA: Diagnosis not present

## 2019-04-27 DIAGNOSIS — D631 Anemia in chronic kidney disease: Secondary | ICD-10-CM | POA: Diagnosis not present

## 2019-04-27 DIAGNOSIS — N186 End stage renal disease: Secondary | ICD-10-CM | POA: Diagnosis not present

## 2019-04-27 DIAGNOSIS — E1122 Type 2 diabetes mellitus with diabetic chronic kidney disease: Secondary | ICD-10-CM | POA: Diagnosis not present

## 2019-04-27 DIAGNOSIS — H9192 Unspecified hearing loss, left ear: Secondary | ICD-10-CM | POA: Diagnosis not present

## 2019-04-27 DIAGNOSIS — D689 Coagulation defect, unspecified: Secondary | ICD-10-CM | POA: Diagnosis not present

## 2019-04-27 DIAGNOSIS — I120 Hypertensive chronic kidney disease with stage 5 chronic kidney disease or end stage renal disease: Secondary | ICD-10-CM | POA: Diagnosis not present

## 2019-04-27 DIAGNOSIS — E785 Hyperlipidemia, unspecified: Secondary | ICD-10-CM | POA: Diagnosis not present

## 2019-04-27 DIAGNOSIS — N2581 Secondary hyperparathyroidism of renal origin: Secondary | ICD-10-CM | POA: Diagnosis not present

## 2019-04-27 DIAGNOSIS — E039 Hypothyroidism, unspecified: Secondary | ICD-10-CM | POA: Diagnosis not present

## 2019-04-28 ENCOUNTER — Encounter: Payer: Self-pay | Admitting: Family Medicine

## 2019-04-29 DIAGNOSIS — D689 Coagulation defect, unspecified: Secondary | ICD-10-CM | POA: Diagnosis not present

## 2019-04-29 DIAGNOSIS — N186 End stage renal disease: Secondary | ICD-10-CM | POA: Diagnosis not present

## 2019-04-29 DIAGNOSIS — D631 Anemia in chronic kidney disease: Secondary | ICD-10-CM | POA: Diagnosis not present

## 2019-04-29 DIAGNOSIS — Z992 Dependence on renal dialysis: Secondary | ICD-10-CM | POA: Diagnosis not present

## 2019-04-29 DIAGNOSIS — Z4901 Encounter for fitting and adjustment of extracorporeal dialysis catheter: Secondary | ICD-10-CM | POA: Diagnosis not present

## 2019-04-29 DIAGNOSIS — N2581 Secondary hyperparathyroidism of renal origin: Secondary | ICD-10-CM | POA: Diagnosis not present

## 2019-04-30 DIAGNOSIS — I480 Paroxysmal atrial fibrillation: Secondary | ICD-10-CM | POA: Diagnosis not present

## 2019-05-01 DIAGNOSIS — Z4901 Encounter for fitting and adjustment of extracorporeal dialysis catheter: Secondary | ICD-10-CM | POA: Diagnosis not present

## 2019-05-01 DIAGNOSIS — N2581 Secondary hyperparathyroidism of renal origin: Secondary | ICD-10-CM | POA: Diagnosis not present

## 2019-05-01 DIAGNOSIS — Z992 Dependence on renal dialysis: Secondary | ICD-10-CM | POA: Diagnosis not present

## 2019-05-01 DIAGNOSIS — N186 End stage renal disease: Secondary | ICD-10-CM | POA: Diagnosis not present

## 2019-05-01 DIAGNOSIS — D689 Coagulation defect, unspecified: Secondary | ICD-10-CM | POA: Diagnosis not present

## 2019-05-01 DIAGNOSIS — D631 Anemia in chronic kidney disease: Secondary | ICD-10-CM | POA: Diagnosis not present

## 2019-05-03 DIAGNOSIS — D689 Coagulation defect, unspecified: Secondary | ICD-10-CM | POA: Diagnosis not present

## 2019-05-03 DIAGNOSIS — D631 Anemia in chronic kidney disease: Secondary | ICD-10-CM | POA: Diagnosis not present

## 2019-05-03 DIAGNOSIS — N2581 Secondary hyperparathyroidism of renal origin: Secondary | ICD-10-CM | POA: Diagnosis not present

## 2019-05-03 DIAGNOSIS — Z4901 Encounter for fitting and adjustment of extracorporeal dialysis catheter: Secondary | ICD-10-CM | POA: Diagnosis not present

## 2019-05-03 DIAGNOSIS — Z992 Dependence on renal dialysis: Secondary | ICD-10-CM | POA: Diagnosis not present

## 2019-05-03 DIAGNOSIS — N186 End stage renal disease: Secondary | ICD-10-CM | POA: Diagnosis not present

## 2019-05-05 DIAGNOSIS — N186 End stage renal disease: Secondary | ICD-10-CM | POA: Diagnosis not present

## 2019-05-05 DIAGNOSIS — Z992 Dependence on renal dialysis: Secondary | ICD-10-CM | POA: Diagnosis not present

## 2019-05-05 DIAGNOSIS — E1122 Type 2 diabetes mellitus with diabetic chronic kidney disease: Secondary | ICD-10-CM | POA: Diagnosis not present

## 2019-05-06 DIAGNOSIS — Z992 Dependence on renal dialysis: Secondary | ICD-10-CM | POA: Diagnosis not present

## 2019-05-06 DIAGNOSIS — N186 End stage renal disease: Secondary | ICD-10-CM | POA: Diagnosis not present

## 2019-05-06 DIAGNOSIS — D631 Anemia in chronic kidney disease: Secondary | ICD-10-CM | POA: Diagnosis not present

## 2019-05-06 DIAGNOSIS — N2581 Secondary hyperparathyroidism of renal origin: Secondary | ICD-10-CM | POA: Diagnosis not present

## 2019-05-06 DIAGNOSIS — Z4901 Encounter for fitting and adjustment of extracorporeal dialysis catheter: Secondary | ICD-10-CM | POA: Diagnosis not present

## 2019-05-06 DIAGNOSIS — D689 Coagulation defect, unspecified: Secondary | ICD-10-CM | POA: Diagnosis not present

## 2019-05-08 DIAGNOSIS — Z992 Dependence on renal dialysis: Secondary | ICD-10-CM | POA: Diagnosis not present

## 2019-05-08 DIAGNOSIS — S6991XA Unspecified injury of right wrist, hand and finger(s), initial encounter: Secondary | ICD-10-CM | POA: Diagnosis not present

## 2019-05-08 DIAGNOSIS — N186 End stage renal disease: Secondary | ICD-10-CM | POA: Diagnosis not present

## 2019-05-08 DIAGNOSIS — D689 Coagulation defect, unspecified: Secondary | ICD-10-CM | POA: Diagnosis not present

## 2019-05-08 DIAGNOSIS — Z4901 Encounter for fitting and adjustment of extracorporeal dialysis catheter: Secondary | ICD-10-CM | POA: Diagnosis not present

## 2019-05-08 DIAGNOSIS — N2581 Secondary hyperparathyroidism of renal origin: Secondary | ICD-10-CM | POA: Diagnosis not present

## 2019-05-08 DIAGNOSIS — D631 Anemia in chronic kidney disease: Secondary | ICD-10-CM | POA: Diagnosis not present

## 2019-05-08 DIAGNOSIS — S63501A Unspecified sprain of right wrist, initial encounter: Secondary | ICD-10-CM | POA: Diagnosis not present

## 2019-05-09 DIAGNOSIS — I480 Paroxysmal atrial fibrillation: Secondary | ICD-10-CM | POA: Diagnosis not present

## 2019-05-09 DIAGNOSIS — M1030 Gout due to renal impairment, unspecified site: Secondary | ICD-10-CM | POA: Diagnosis not present

## 2019-05-09 DIAGNOSIS — I120 Hypertensive chronic kidney disease with stage 5 chronic kidney disease or end stage renal disease: Secondary | ICD-10-CM | POA: Diagnosis not present

## 2019-05-09 DIAGNOSIS — I872 Venous insufficiency (chronic) (peripheral): Secondary | ICD-10-CM | POA: Diagnosis not present

## 2019-05-09 DIAGNOSIS — J449 Chronic obstructive pulmonary disease, unspecified: Secondary | ICD-10-CM | POA: Diagnosis not present

## 2019-05-09 DIAGNOSIS — H9192 Unspecified hearing loss, left ear: Secondary | ICD-10-CM | POA: Diagnosis not present

## 2019-05-09 DIAGNOSIS — E1122 Type 2 diabetes mellitus with diabetic chronic kidney disease: Secondary | ICD-10-CM | POA: Diagnosis not present

## 2019-05-09 DIAGNOSIS — D631 Anemia in chronic kidney disease: Secondary | ICD-10-CM | POA: Diagnosis not present

## 2019-05-09 DIAGNOSIS — E785 Hyperlipidemia, unspecified: Secondary | ICD-10-CM | POA: Diagnosis not present

## 2019-05-09 DIAGNOSIS — E039 Hypothyroidism, unspecified: Secondary | ICD-10-CM | POA: Diagnosis not present

## 2019-05-09 DIAGNOSIS — N186 End stage renal disease: Secondary | ICD-10-CM | POA: Diagnosis not present

## 2019-05-09 DIAGNOSIS — M1990 Unspecified osteoarthritis, unspecified site: Secondary | ICD-10-CM | POA: Diagnosis not present

## 2019-05-09 DIAGNOSIS — E1151 Type 2 diabetes mellitus with diabetic peripheral angiopathy without gangrene: Secondary | ICD-10-CM | POA: Diagnosis not present

## 2019-05-10 DIAGNOSIS — Z992 Dependence on renal dialysis: Secondary | ICD-10-CM | POA: Diagnosis not present

## 2019-05-10 DIAGNOSIS — Z4901 Encounter for fitting and adjustment of extracorporeal dialysis catheter: Secondary | ICD-10-CM | POA: Diagnosis not present

## 2019-05-10 DIAGNOSIS — N186 End stage renal disease: Secondary | ICD-10-CM | POA: Diagnosis not present

## 2019-05-10 DIAGNOSIS — D631 Anemia in chronic kidney disease: Secondary | ICD-10-CM | POA: Diagnosis not present

## 2019-05-10 DIAGNOSIS — D689 Coagulation defect, unspecified: Secondary | ICD-10-CM | POA: Diagnosis not present

## 2019-05-10 DIAGNOSIS — N2581 Secondary hyperparathyroidism of renal origin: Secondary | ICD-10-CM | POA: Diagnosis not present

## 2019-05-13 DIAGNOSIS — Z992 Dependence on renal dialysis: Secondary | ICD-10-CM | POA: Diagnosis not present

## 2019-05-13 DIAGNOSIS — N186 End stage renal disease: Secondary | ICD-10-CM | POA: Diagnosis not present

## 2019-05-13 DIAGNOSIS — D689 Coagulation defect, unspecified: Secondary | ICD-10-CM | POA: Diagnosis not present

## 2019-05-13 DIAGNOSIS — N2581 Secondary hyperparathyroidism of renal origin: Secondary | ICD-10-CM | POA: Diagnosis not present

## 2019-05-13 DIAGNOSIS — Z4901 Encounter for fitting and adjustment of extracorporeal dialysis catheter: Secondary | ICD-10-CM | POA: Diagnosis not present

## 2019-05-13 DIAGNOSIS — D631 Anemia in chronic kidney disease: Secondary | ICD-10-CM | POA: Diagnosis not present

## 2019-05-13 NOTE — Progress Notes (Signed)
Assessment & Plan:  1. Dyslipidemia - Well controlled on current regimen. Lipid panel order printed to take to dialysis.  - Lipid panel; Future  2. Chronic obstructive pulmonary disease, unspecified COPD type ([place]) - Well controlled on current regimen. Managed by Dr. Melvyn Novas.  - albuterol (VENTOLIN HFA) 108 (90 Base) MCG/ACT inhaler; Inhale 2 puffs into the lungs every 6 (six) hours as needed for wheezing or shortness of breath.  Dispense: 18 g; Refill: 2  3. Anxiety - Well controlled on current regimen.   4. Restless leg syndrome - Well controlled on current regimen.   5. Chronic gout without tophus, unspecified cause, unspecified site - Well controlled on current regimen.   6. Vitamin D deficiency - Well controlled on current regimen.   7. Age-related osteoporosis without current pathological fracture - DEXA scan today.   8. Gastroesophageal reflux disease without esophagitis - Well controlled on current regimen.   9. ESRD (end stage renal disease) on dialysis [facility]) - Patient on dialysis MWF.  10-12. Chronic midline thoracic back pain/Arthritis/Controlled substance agreement signed - Well controlled on current regimen. Controlled substance agreement in place. Drug screen with no concerns and within the last year. Refills sent for Norco 5/325.   21. Essential hypertension - Well controlled on current regimen.   14. Right wrist pain - Improving. Continue wrist splint, ice, and topical cream.    Return in about 3 months (around 08/13/2019) for follow-up of chronic medication conditions.  [name], MSN, APRN, FNP-C [facility]  Subjective:    Patient ID: [name], female    DOB: September 05, 1940, 79 y.o.   MRN: [id number]  Patient Care Team: [name], FNP as PCP - General (Family Medicine) Minus Breeding, MD as Consulting Physician (Cardiology) Tanda Rockers, MD as Consulting Physician (Pulmonary Disease) Jamal Maes, MD as  Consulting Physician (Nephrology)   Chief Complaint:  Chief Complaint  Patient presents with  . Medical Management of Chronic Issues    HPI: [name] is a 79 y.o. female presenting on 05/16/2019 for Medical Management of Chronic Issues  Anxiety is well controlled per patient.  RLS is well controlled with Mirapex.   No recent gout flares.   Patient rarely requires Albuterol inhaler. She does use Symbicort as prescribed. Dr. Melvyn Novas (pulmonologist) continues to manage.   Continues on vitamin D supplement.  DEXA scan planned for today.   GERD controlled with Protonix.   Patient gets dialysis MWF.   Pain assessment: Cause of pain- arthritis Pain location- back and knees Pain on scale of 1-10- 6/10 Frequency- daily What increases pain- decreased activity, heat, rubs What makes pain better- Norco, stretching, massage Effects on ADL - able to tolerate to complete ADLs Any change in general medical condition- No  Current opioids rx- Norco 5/325 PO Q6H PRN # prescribed- 30 Effectiveness of current meds- very effective Adverse reactions form pain meds- none Morphine equivalent- 5 MME/day  Pill count performed - No Last drug screen - 11/09/2018 ( high risk q88m moderate risk q641mlow risk yearly ) Urine drug screen today- No Was the NCDel Aireeviewed- Yes  If yes were their any concerning findings? - No  Pain contract signed on: 11/09/2018   She has seen the orthopedic surgeon and reports she does have a tendon rupture and was told to wear a splint x3 weeks. Chart review states patient has a right wrist sprain. Wrist brace applied, ice, topical cream.   New complaints: None  Social history:  Relevant past medical, surgical, family and social history reviewed and updated as indicated. Interim medical history since our last visit reviewed.  Allergies and medications reviewed and updated.  DATA REVIEWED: CHART IN EPIC  ROS: Negative unless specifically indicated above in  HPI.    Current Outpatient Medications:  .  acetaminophen (TYLENOL) 500 MG tablet, Take 2 tablets (1,000 mg total) by mouth every 8 (eight) hours as needed for mild pain or headache., Disp: 30 tablet, Rfl: 0 .  albuterol (VENTOLIN HFA) 108 (90 Base) MCG/ACT inhaler, Inhale 2 puffs into the lungs every 6 (six) hours as needed for wheezing or shortness of breath., Disp: 18 g, Rfl: 2 .  alendronate (FOSAMAX) 70 MG tablet, Take 1 tablet (70 mg total) by mouth every Thursday., Disp: 12 tablet, Rfl: 3 .  aspirin EC 81 MG tablet, Take 81 mg by mouth daily., Disp: , Rfl:  .  budesonide-formoterol (SYMBICORT) 80-4.5 MCG/ACT inhaler, Inhale 2 puffs into the lungs 2 (two) times daily., Disp: 3 Inhaler, Rfl: 4 .  cinacalcet (SENSIPAR) 30 MG tablet, Take 1 tablet (30 mg total) by mouth every Monday, Wednesday, and Friday., Disp: 60 tablet, Rfl: 0 .  citalopram (CELEXA) 40 MG tablet, Take 1 tablet (40 mg total) by mouth daily., Disp: 90 tablet, Rfl: 1 .  colchicine 0.6 MG tablet, Take 2 tablets by mouth at onset of gout flare and then 1 tablet 1 hour later. Use as needed., Disp: 15 tablet, Rfl: 1 .  docusate calcium (SURFAK) 240 MG capsule, Take 1 capsule (240 mg total) by mouth daily., Disp: 90 capsule, Rfl: 1 .  doxercalciferol (HECTOROL) 4 MCG/2ML injection, Inject 0.5 mLs (1 mcg total) into the vein every Monday, Wednesday, and Friday with hemodialysis., Disp: 2 mL, Rfl: 0 .  ezetimibe (ZETIA) 10 MG tablet, Take 1 tablet (10 mg total) by mouth daily., Disp: 90 tablet, Rfl: 3 .  famotidine (PEPCID) 20 MG tablet, TAKE 1 TABLET BY MOUTH TWICE A DAY, Disp: 180 tablet, Rfl: 1 .  HYDROcodone-acetaminophen (NORCO/VICODIN) 5-325 MG tablet, Take 1 tablet by mouth every 6 (six) hours as needed for moderate pain., Disp: 30 tablet, Rfl: 0 .  levothyroxine (SYNTHROID) 100 MCG tablet, Take 1 tablet (100 mcg total) by mouth daily., Disp: 90 tablet, Rfl: 3 .  Multiple Vitamin (MULTIVITAMIN) tablet, Take 1 tablet by mouth  daily., Disp: , Rfl:  .  multivitamin (RENA-VIT) TABS tablet, Take 1 tablet by mouth daily., Disp: , Rfl:  .  nystatin cream (MYCOSTATIN), Apply 1 application topically 2 (two) times daily as needed for dry skin., Disp: 30 g, Rfl: 0 .  OXYGEN, Inhale 3 L into the lungs continuous., Disp: , Rfl:  .  pantoprazole (PROTONIX) 20 MG tablet, Take 1 tablet (20 mg total) by mouth daily., Disp: 30 tablet, Rfl: 6 .  pramipexole (MIRAPEX) 0.125 MG tablet, TAKE 1 TABLET (0.125 MG TOTAL) BY MOUTH AT BEDTIME., Disp: 90 tablet, Rfl: 1 .  Vitamin D, Ergocalciferol, (DRISDOL) 1.25 MG (50000 UT) CAPS capsule, Take 1 capsule (50,000 Units total) by mouth every Monday., Disp: 12 capsule, Rfl: 3 .  HYDROcodone-acetaminophen (NORCO/VICODIN) 5-325 MG tablet, Take 1 tablet by mouth every 6 (six) hours as needed for moderate pain., Disp: 30 tablet, Rfl: 0 .  HYDROcodone-acetaminophen (NORCO/VICODIN) 5-325 MG tablet, Take 1 tablet by mouth every 6 (six) hours as needed for moderate pain., Disp: 30 tablet, Rfl: 0   Allergies  Allergen Reactions  . Amoxicillin Rash    Has patient had a  PCN reaction causing immediate rash, facial/tongue/throat swelling, SOB or lightheadedness with hypotension:YES Has patient had a PCN reaction causing severe rash involving mucus membranes or skin necrosis: Yes Has patient had a PCN reaction that required hospitalization No Has patient had a PCN reaction occurring within the last 10 years: Yes If all of the above answers are "NO", then may proceed with Cephalosporin use.   . Sulfa Drugs Cross Reactors Hives and Itching  . Percocet [Oxycodone-Acetaminophen] Itching and Rash    Did not happen last time she took it  . Sulfa Antibiotics Hives   Past Medical History:  Diagnosis Date  . Acute respiratory failure ([place]) 05/2017  . Anemia   . Anxiety   . Arthritis   . COPD (chronic obstructive pulmonary disease) ([place])   . Depression   . ESRD (end stage renal disease) ([place])    dialysis MWF   . GERD (gastroesophageal reflux disease)   . Gout   . History of blood transfusion   . History of diabetes mellitus    "diet controlled"  . HLD (hyperlipidemia)   . HOH (hard of hearing)    left ear  . Hypertension    hypotensive -since starting dialysis  . Hypothyroidism   . MRSA (methicillin resistant staph aureus) culture positive 06/01/2017  . PAF (paroxysmal atrial fibrillation) ([place])    a. Echo 11/16:  Mild LVH, EF 55-60%, normal wall motion, MAC, mild MR, severe LAE (49 ml/m2), mild RVE, normal RVSF, mild RAE, mild TR, PASP 24 mmHg;  CHADS2-VASc: 4 >> Coumadin followed by PCP  . Pneumonia   . Renal disorder   . 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 Wears dentures   . Wears glasses     Past Surgical History:  Procedure Laterality Date  . AV FISTULA PLACEMENT  04/03/2012   Procedure: ARTERIOVENOUS (AV) FISTULA CREATION;  Surgeon: Elam Dutch, MD;  Location: [place];  Service: Vascular;  Laterality: Left;  creation left brachial cephalic fistula   . BIOPSY  08/21/2018   Procedure: BIOPSY;  Surgeon: Thornton Park, MD;  Location: [place];  Service: Gastroenterology;;  . CARPAL TUNNEL RELEASE Bilateral   . COLONOSCOPY W/ POLYPECTOMY    . COLONOSCOPY WITH PROPOFOL N/A 08/21/2018   Procedure: COLONOSCOPY WITH PROPOFOL;  Surgeon: Thornton Park, MD;  Location: [place];  Service: Gastroenterology;  Laterality: N/A;  . DILATION AND CURETTAGE OF UTERUS    . ESOPHAGOGASTRODUODENOSCOPY (EGD) WITH PROPOFOL N/A 08/21/2018   Procedure: ESOPHAGOGASTRODUODENOSCOPY (EGD) WITH PROPOFOL;  Surgeon: Thornton Park, MD;  Location: [place];  Service: Gastroenterology;  Laterality: N/A;  . EYE SURGERY Bilateral    bilateral cataract removal  . HEMATOMA EVACUATION Left 05/14/2018   Procedure: Incision and Drainage of Left Arm Hematoma;  Surgeon: Elam Dutch, MD;  [facility];  Service: Vascular;  Laterality: Left;  . Hemodialysis  catheter Right   . IR  FLUORO GUIDE CV LINE RIGHT  05/26/2017  . IR REMOVAL TUN CV CATH W/O FL  05/24/2017  . IR US GUIDE VASC ACCESS RIGHT  05/26/2017  . LIGATION OF ARTERIOVENOUS  FISTULA Left 02/04/2015   Procedure: LIGATION OF BRACHIOCEPHALIC ARTERIOVENOUS  FISTULA;  Surgeon: Conrad Pemberwick, MD;  Location: [place];  Service: Vascular;  Laterality: Left;  Marland Kitchen MULTIPLE TOOTH EXTRACTIONS    . POLYPECTOMY  08/21/2018   Procedure: POLYPECTOMY;  Surgeon: Thornton Park, MD;  Location: [place];  Service: Gastroenterology;;  . PORTACATH PLACEMENT    . REVERSE SHOULDER ARTHROPLASTY Left 01/22/2016  Procedure: LEFT REVERSE SHOULDER ARTHROPLASTY;  Surgeon: Netta Cedars, MD;  Location: [place];  Service: Orthopedics;  Laterality: Left;  . SHOULDER ARTHROSCOPY Bilateral   . SPLIT NIGHT STUDY  07/26/2015  . STERIOD INJECTION Right 01/22/2016   Procedure: RIGHT RING FINGER STEROID INJECTION;  Surgeon: Netta Cedars, MD;  Location: [place];  Service: Orthopedics;  Laterality: Right;  . TEE WITHOUT CARDIOVERSION N/A 05/26/2017   Procedure: TRANSESOPHAGEAL ECHOCARDIOGRAM (TEE);  Surgeon: Lelon Perla, MD;  Location: [place];  Service: Cardiovascular;  Laterality: N/A;  . THROMBECTOMY BRACHIAL ARTERY Left 02/06/2015   Procedure: EVACUATION OF LEFT ARM HEMATOMA;  Surgeon: Angelia Mould, MD;  Location: [place];  Service: Vascular;  Laterality: Left;  . TOTAL KNEE ARTHROPLASTY Bilateral   . TUBAL LIGATION      Social History   Socioeconomic History  . Marital status: Widowed    Spouse name: Not on file  . Number of children: 6  . Years of education: 37  . Highest education level: 10th grade  Occupational History  . Occupation: RETIRED  Tobacco Use  . Smoking status: Former Smoker    Packs/day: 0.25    Years: 2.00    Pack years: 0.50    Types: Cigarettes    Quit date: 04/04/1998    Years since quitting: 21.1  . Smokeless tobacco: Never Used  Substance and Sexual Activity  . Alcohol use: No    Alcohol/week: 0.0  standard drinks  . Drug use: No  . Sexual activity: Not Currently    Birth control/protection: None  Other Topics Concern  . Not on file  Social History Narrative   ** Merged History Encounter **       Widowed 6 children Lives with son and daughter in law homemaker   Social Determinants of Health   Financial Resource Strain: Low Risk   . Difficulty of Paying Living Expenses: Not hard at all  Food Insecurity: No Food Insecurity  . Worried About Charity fundraiser in the Last Year: Never true  . Ran Out of Food in the Last Year: Never true  Transportation Needs: No Transportation Needs  . Lack of Transportation (Medical): No  . Lack of Transportation (Non-Medical): No  Physical Activity: Inactive  . Days of Exercise per Week: 0 days  . Minutes of Exercise per Session: 0 min  Stress: No Stress Concern Present  . Feeling of Stress : Not at all  Social Connections: Somewhat Isolated  . Frequency of Communication with Friends and Family: More than three times a week  . Frequency of Social Gatherings with Friends and Family: Three times a week  . Attends Religious Services: More than 4 times per year  . Active Member of Clubs or Organizations: No  . Attends Archivist Meetings: Never  . Marital Status: Widowed  Intimate Partner Violence: Not At Risk  . Fear of Current or Ex-Partner: No  . Emotionally Abused: No  . Physically Abused: No  . Sexually Abused: No        Objective:    BP 124/83   Pulse 67   Temp 98 F (36.7 C) (Temporal)   Ht _0  (1.651 m)   Wt 157 lb (71.2 kg)   SpO2 98%   BMI 26.13 kg/m   Physical Exam Vitals reviewed.  Constitutional:      General: She is not in acute distress.    Appearance: Normal appearance. She is overweight. She is not ill-appearing, toxic-appearing or diaphoretic.  HENT:     Head: Normocephalic and atraumatic.  Eyes:     General: No scleral icterus.       Right eye: No discharge.        Left eye: No  discharge.     Conjunctiva/sclera: Conjunctivae normal.  Cardiovascular:     Rate and Rhythm: Normal rate and regular rhythm.     Heart sounds: Normal heart sounds. No murmur. No friction rub. No gallop.   Pulmonary:     Effort: Pulmonary effort is normal. No respiratory distress.     Breath sounds: Normal breath sounds. No stridor. No wheezing, rhonchi or rales.  Musculoskeletal:        General: Normal range of motion.     Cervical back: Normal range of motion.     Comments: Wearing right wrist splint.   Skin:    General: Skin is warm and dry.     Capillary Refill: Capillary refill takes less than 2 seconds.  Neurological:     General: No focal deficit present.     Mental Status: She is alert and oriented to person, place, and time. Mental status is at baseline.  Psychiatric:        Mood and Affect: Mood normal.        Behavior: Behavior normal.        Thought Content: Thought content normal.        Judgment: Judgment normal.     Lab Results  Component Value Date   TSH 2.020 02/04/2019   Lab Results  Component Value Date   WBC 7.4 02/04/2019   HGB 12.1 02/04/2019   HCT 37.3 02/04/2019   MCV 94 02/04/2019   PLT 210 02/04/2019   Lab Results  Component Value Date   NA 141 02/04/2019   K 4.3 02/04/2019   CO2 23 02/04/2019   GLUCOSE 88 02/04/2019   BUN 10 02/04/2019   CREATININE 3.66 (H) 02/04/2019   BILITOT 0.6 09/06/2018   ALKPHOS 140 (H) 09/06/2018   AST 21 09/06/2018   ALT 15 09/06/2018   PROT 6.0 (L) 09/06/2018   ALBUMIN 3.5 09/06/2018   CALCIUM 10.2 02/04/2019   ANIONGAP 9 09/06/2018   Lab Results  Component Value Date   CHOL 107 05/25/2017   Lab Results  Component Value Date   HDL 32 (L) 05/25/2017   Lab Results  Component Value Date   LDLCALC 57 05/25/2017   Lab Results  Component Value Date   TRIG 88 05/25/2017   Lab Results  Component Value Date   CHOLHDL 3.3 05/25/2017   Lab Results  Component Value Date   HGBA1C 4.5 (L) 05/25/2017

## 2019-05-15 DIAGNOSIS — N186 End stage renal disease: Secondary | ICD-10-CM | POA: Diagnosis not present

## 2019-05-15 DIAGNOSIS — D689 Coagulation defect, unspecified: Secondary | ICD-10-CM | POA: Diagnosis not present

## 2019-05-15 DIAGNOSIS — D631 Anemia in chronic kidney disease: Secondary | ICD-10-CM | POA: Diagnosis not present

## 2019-05-15 DIAGNOSIS — N2581 Secondary hyperparathyroidism of renal origin: Secondary | ICD-10-CM | POA: Diagnosis not present

## 2019-05-15 DIAGNOSIS — Z4901 Encounter for fitting and adjustment of extracorporeal dialysis catheter: Secondary | ICD-10-CM | POA: Diagnosis not present

## 2019-05-15 DIAGNOSIS — Z992 Dependence on renal dialysis: Secondary | ICD-10-CM | POA: Diagnosis not present

## 2019-05-16 ENCOUNTER — Ambulatory Visit (INDEPENDENT_AMBULATORY_CARE_PROVIDER_SITE_OTHER): Payer: Medicare Other | Admitting: Family Medicine

## 2019-05-16 ENCOUNTER — Ambulatory Visit (INDEPENDENT_AMBULATORY_CARE_PROVIDER_SITE_OTHER): Payer: Medicare Other

## 2019-05-16 ENCOUNTER — Encounter: Payer: Self-pay | Admitting: Family Medicine

## 2019-05-16 ENCOUNTER — Other Ambulatory Visit: Payer: Self-pay

## 2019-05-16 VITALS — BP 124/83 | HR 67 | Temp 98.0°F | Ht 65.0 in | Wt 157.0 lb

## 2019-05-16 DIAGNOSIS — L84 Corns and callosities: Secondary | ICD-10-CM | POA: Diagnosis not present

## 2019-05-16 DIAGNOSIS — Z780 Asymptomatic menopausal state: Secondary | ICD-10-CM

## 2019-05-16 DIAGNOSIS — M1A9XX0 Chronic gout, unspecified, without tophus (tophi): Secondary | ICD-10-CM | POA: Diagnosis not present

## 2019-05-16 DIAGNOSIS — F419 Anxiety disorder, unspecified: Secondary | ICD-10-CM

## 2019-05-16 DIAGNOSIS — G2581 Restless legs syndrome: Secondary | ICD-10-CM

## 2019-05-16 DIAGNOSIS — M25531 Pain in right wrist: Secondary | ICD-10-CM

## 2019-05-16 DIAGNOSIS — Z992 Dependence on renal dialysis: Secondary | ICD-10-CM

## 2019-05-16 DIAGNOSIS — J449 Chronic obstructive pulmonary disease, unspecified: Secondary | ICD-10-CM | POA: Diagnosis not present

## 2019-05-16 DIAGNOSIS — Z79899 Other long term (current) drug therapy: Secondary | ICD-10-CM

## 2019-05-16 DIAGNOSIS — K219 Gastro-esophageal reflux disease without esophagitis: Secondary | ICD-10-CM

## 2019-05-16 DIAGNOSIS — G8929 Other chronic pain: Secondary | ICD-10-CM

## 2019-05-16 DIAGNOSIS — M810 Age-related osteoporosis without current pathological fracture: Secondary | ICD-10-CM

## 2019-05-16 DIAGNOSIS — E785 Hyperlipidemia, unspecified: Secondary | ICD-10-CM | POA: Diagnosis not present

## 2019-05-16 DIAGNOSIS — N186 End stage renal disease: Secondary | ICD-10-CM

## 2019-05-16 DIAGNOSIS — M546 Pain in thoracic spine: Secondary | ICD-10-CM

## 2019-05-16 DIAGNOSIS — I70203 Unspecified atherosclerosis of native arteries of extremities, bilateral legs: Secondary | ICD-10-CM | POA: Diagnosis not present

## 2019-05-16 DIAGNOSIS — B351 Tinea unguium: Secondary | ICD-10-CM | POA: Diagnosis not present

## 2019-05-16 DIAGNOSIS — E559 Vitamin D deficiency, unspecified: Secondary | ICD-10-CM

## 2019-05-16 DIAGNOSIS — I10 Essential (primary) hypertension: Secondary | ICD-10-CM

## 2019-05-16 DIAGNOSIS — M1990 Unspecified osteoarthritis, unspecified site: Secondary | ICD-10-CM

## 2019-05-16 DIAGNOSIS — M79676 Pain in unspecified toe(s): Secondary | ICD-10-CM | POA: Diagnosis not present

## 2019-05-16 MED ORDER — ALBUTEROL SULFATE HFA 108 (90 BASE) MCG/ACT IN AERS: 2.0000 | INHALATION_SPRAY | Freq: Four times a day (QID) | RESPIRATORY_TRACT | 2 refills | Status: DC | PRN

## 2019-05-17 DIAGNOSIS — N2581 Secondary hyperparathyroidism of renal origin: Secondary | ICD-10-CM | POA: Diagnosis not present

## 2019-05-17 DIAGNOSIS — Z4901 Encounter for fitting and adjustment of extracorporeal dialysis catheter: Secondary | ICD-10-CM | POA: Diagnosis not present

## 2019-05-17 DIAGNOSIS — D689 Coagulation defect, unspecified: Secondary | ICD-10-CM | POA: Diagnosis not present

## 2019-05-17 DIAGNOSIS — J449 Chronic obstructive pulmonary disease, unspecified: Secondary | ICD-10-CM | POA: Diagnosis not present

## 2019-05-17 DIAGNOSIS — N186 End stage renal disease: Secondary | ICD-10-CM | POA: Diagnosis not present

## 2019-05-17 DIAGNOSIS — Z992 Dependence on renal dialysis: Secondary | ICD-10-CM | POA: Diagnosis not present

## 2019-05-17 DIAGNOSIS — D631 Anemia in chronic kidney disease: Secondary | ICD-10-CM | POA: Diagnosis not present

## 2019-05-20 DIAGNOSIS — N2581 Secondary hyperparathyroidism of renal origin: Secondary | ICD-10-CM | POA: Diagnosis not present

## 2019-05-20 DIAGNOSIS — D689 Coagulation defect, unspecified: Secondary | ICD-10-CM | POA: Diagnosis not present

## 2019-05-20 DIAGNOSIS — N186 End stage renal disease: Secondary | ICD-10-CM | POA: Diagnosis not present

## 2019-05-20 DIAGNOSIS — D631 Anemia in chronic kidney disease: Secondary | ICD-10-CM | POA: Diagnosis not present

## 2019-05-20 DIAGNOSIS — Z4901 Encounter for fitting and adjustment of extracorporeal dialysis catheter: Secondary | ICD-10-CM | POA: Diagnosis not present

## 2019-05-20 DIAGNOSIS — Z992 Dependence on renal dialysis: Secondary | ICD-10-CM | POA: Diagnosis not present

## 2019-05-22 DIAGNOSIS — N186 End stage renal disease: Secondary | ICD-10-CM | POA: Diagnosis not present

## 2019-05-22 DIAGNOSIS — Z992 Dependence on renal dialysis: Secondary | ICD-10-CM | POA: Diagnosis not present

## 2019-05-22 DIAGNOSIS — D631 Anemia in chronic kidney disease: Secondary | ICD-10-CM | POA: Diagnosis not present

## 2019-05-22 DIAGNOSIS — D689 Coagulation defect, unspecified: Secondary | ICD-10-CM | POA: Diagnosis not present

## 2019-05-22 DIAGNOSIS — Z4901 Encounter for fitting and adjustment of extracorporeal dialysis catheter: Secondary | ICD-10-CM | POA: Diagnosis not present

## 2019-05-22 DIAGNOSIS — N2581 Secondary hyperparathyroidism of renal origin: Secondary | ICD-10-CM | POA: Diagnosis not present

## 2019-05-24 DIAGNOSIS — D631 Anemia in chronic kidney disease: Secondary | ICD-10-CM | POA: Diagnosis not present

## 2019-05-24 DIAGNOSIS — D689 Coagulation defect, unspecified: Secondary | ICD-10-CM | POA: Diagnosis not present

## 2019-05-24 DIAGNOSIS — N186 End stage renal disease: Secondary | ICD-10-CM | POA: Diagnosis not present

## 2019-05-24 DIAGNOSIS — Z992 Dependence on renal dialysis: Secondary | ICD-10-CM | POA: Diagnosis not present

## 2019-05-24 DIAGNOSIS — Z4901 Encounter for fitting and adjustment of extracorporeal dialysis catheter: Secondary | ICD-10-CM | POA: Diagnosis not present

## 2019-05-24 DIAGNOSIS — N2581 Secondary hyperparathyroidism of renal origin: Secondary | ICD-10-CM | POA: Diagnosis not present

## 2019-05-27 DIAGNOSIS — N2581 Secondary hyperparathyroidism of renal origin: Secondary | ICD-10-CM | POA: Diagnosis not present

## 2019-05-27 DIAGNOSIS — N186 End stage renal disease: Secondary | ICD-10-CM | POA: Diagnosis not present

## 2019-05-27 DIAGNOSIS — D631 Anemia in chronic kidney disease: Secondary | ICD-10-CM | POA: Diagnosis not present

## 2019-05-27 DIAGNOSIS — D689 Coagulation defect, unspecified: Secondary | ICD-10-CM | POA: Diagnosis not present

## 2019-05-27 DIAGNOSIS — Z4901 Encounter for fitting and adjustment of extracorporeal dialysis catheter: Secondary | ICD-10-CM | POA: Diagnosis not present

## 2019-05-27 DIAGNOSIS — Z992 Dependence on renal dialysis: Secondary | ICD-10-CM | POA: Diagnosis not present

## 2019-05-28 ENCOUNTER — Other Ambulatory Visit: Payer: Self-pay

## 2019-05-28 ENCOUNTER — Ambulatory Visit (INDEPENDENT_AMBULATORY_CARE_PROVIDER_SITE_OTHER): Payer: Medicare Other

## 2019-05-28 DIAGNOSIS — E1122 Type 2 diabetes mellitus with diabetic chronic kidney disease: Secondary | ICD-10-CM | POA: Diagnosis not present

## 2019-05-28 DIAGNOSIS — K319 Disease of stomach and duodenum, unspecified: Secondary | ICD-10-CM

## 2019-05-28 DIAGNOSIS — N186 End stage renal disease: Secondary | ICD-10-CM | POA: Diagnosis not present

## 2019-05-28 DIAGNOSIS — E785 Hyperlipidemia, unspecified: Secondary | ICD-10-CM

## 2019-05-28 DIAGNOSIS — D631 Anemia in chronic kidney disease: Secondary | ICD-10-CM

## 2019-05-28 DIAGNOSIS — M1030 Gout due to renal impairment, unspecified site: Secondary | ICD-10-CM | POA: Diagnosis not present

## 2019-05-28 DIAGNOSIS — E039 Hypothyroidism, unspecified: Secondary | ICD-10-CM

## 2019-05-28 DIAGNOSIS — K5730 Diverticulosis of large intestine without perforation or abscess without bleeding: Secondary | ICD-10-CM

## 2019-05-28 DIAGNOSIS — F329 Major depressive disorder, single episode, unspecified: Secondary | ICD-10-CM

## 2019-05-28 DIAGNOSIS — K2100 Gastro-esophageal reflux disease with esophagitis, without bleeding: Secondary | ICD-10-CM

## 2019-05-28 DIAGNOSIS — K222 Esophageal obstruction: Secondary | ICD-10-CM

## 2019-05-28 DIAGNOSIS — I120 Hypertensive chronic kidney disease with stage 5 chronic kidney disease or end stage renal disease: Secondary | ICD-10-CM

## 2019-05-28 DIAGNOSIS — I480 Paroxysmal atrial fibrillation: Secondary | ICD-10-CM

## 2019-05-28 DIAGNOSIS — M1990 Unspecified osteoarthritis, unspecified site: Secondary | ICD-10-CM

## 2019-05-28 DIAGNOSIS — I872 Venous insufficiency (chronic) (peripheral): Secondary | ICD-10-CM

## 2019-05-28 DIAGNOSIS — H9192 Unspecified hearing loss, left ear: Secondary | ICD-10-CM

## 2019-05-28 DIAGNOSIS — K649 Unspecified hemorrhoids: Secondary | ICD-10-CM

## 2019-05-28 DIAGNOSIS — E1151 Type 2 diabetes mellitus with diabetic peripheral angiopathy without gangrene: Secondary | ICD-10-CM

## 2019-05-28 DIAGNOSIS — J449 Chronic obstructive pulmonary disease, unspecified: Secondary | ICD-10-CM

## 2019-05-29 DIAGNOSIS — Z992 Dependence on renal dialysis: Secondary | ICD-10-CM | POA: Diagnosis not present

## 2019-05-29 DIAGNOSIS — Z4901 Encounter for fitting and adjustment of extracorporeal dialysis catheter: Secondary | ICD-10-CM | POA: Diagnosis not present

## 2019-05-29 DIAGNOSIS — D689 Coagulation defect, unspecified: Secondary | ICD-10-CM | POA: Diagnosis not present

## 2019-05-29 DIAGNOSIS — N2581 Secondary hyperparathyroidism of renal origin: Secondary | ICD-10-CM | POA: Diagnosis not present

## 2019-05-29 DIAGNOSIS — N186 End stage renal disease: Secondary | ICD-10-CM | POA: Diagnosis not present

## 2019-05-29 DIAGNOSIS — D631 Anemia in chronic kidney disease: Secondary | ICD-10-CM | POA: Diagnosis not present

## 2019-05-31 DIAGNOSIS — Z992 Dependence on renal dialysis: Secondary | ICD-10-CM | POA: Diagnosis not present

## 2019-05-31 DIAGNOSIS — D631 Anemia in chronic kidney disease: Secondary | ICD-10-CM | POA: Diagnosis not present

## 2019-05-31 DIAGNOSIS — Z4901 Encounter for fitting and adjustment of extracorporeal dialysis catheter: Secondary | ICD-10-CM | POA: Diagnosis not present

## 2019-05-31 DIAGNOSIS — N186 End stage renal disease: Secondary | ICD-10-CM | POA: Diagnosis not present

## 2019-05-31 DIAGNOSIS — D689 Coagulation defect, unspecified: Secondary | ICD-10-CM | POA: Diagnosis not present

## 2019-05-31 DIAGNOSIS — I480 Paroxysmal atrial fibrillation: Secondary | ICD-10-CM | POA: Diagnosis not present

## 2019-05-31 DIAGNOSIS — N2581 Secondary hyperparathyroidism of renal origin: Secondary | ICD-10-CM | POA: Diagnosis not present

## 2019-06-02 DIAGNOSIS — N186 End stage renal disease: Secondary | ICD-10-CM | POA: Diagnosis not present

## 2019-06-02 DIAGNOSIS — Z992 Dependence on renal dialysis: Secondary | ICD-10-CM | POA: Diagnosis not present

## 2019-06-02 DIAGNOSIS — E1122 Type 2 diabetes mellitus with diabetic chronic kidney disease: Secondary | ICD-10-CM | POA: Diagnosis not present

## 2019-06-03 DIAGNOSIS — D689 Coagulation defect, unspecified: Secondary | ICD-10-CM | POA: Diagnosis not present

## 2019-06-03 DIAGNOSIS — Z4901 Encounter for fitting and adjustment of extracorporeal dialysis catheter: Secondary | ICD-10-CM | POA: Diagnosis not present

## 2019-06-03 DIAGNOSIS — N2581 Secondary hyperparathyroidism of renal origin: Secondary | ICD-10-CM | POA: Diagnosis not present

## 2019-06-03 DIAGNOSIS — Z992 Dependence on renal dialysis: Secondary | ICD-10-CM | POA: Diagnosis not present

## 2019-06-03 DIAGNOSIS — N186 End stage renal disease: Secondary | ICD-10-CM | POA: Diagnosis not present

## 2019-06-03 DIAGNOSIS — D631 Anemia in chronic kidney disease: Secondary | ICD-10-CM | POA: Diagnosis not present

## 2019-06-04 ENCOUNTER — Encounter: Payer: Self-pay | Admitting: Family Medicine

## 2019-06-04 ENCOUNTER — Ambulatory Visit (INDEPENDENT_AMBULATORY_CARE_PROVIDER_SITE_OTHER): Payer: Medicare Other | Admitting: Family Medicine

## 2019-06-04 ENCOUNTER — Other Ambulatory Visit: Payer: Self-pay

## 2019-06-04 VITALS — BP 154/90 | HR 67 | Temp 97.8°F | Ht 65.0 in | Wt 162.0 lb

## 2019-06-04 DIAGNOSIS — D631 Anemia in chronic kidney disease: Secondary | ICD-10-CM | POA: Diagnosis not present

## 2019-06-04 DIAGNOSIS — M7712 Lateral epicondylitis, left elbow: Secondary | ICD-10-CM | POA: Diagnosis not present

## 2019-06-04 DIAGNOSIS — N2581 Secondary hyperparathyroidism of renal origin: Secondary | ICD-10-CM | POA: Diagnosis not present

## 2019-06-04 DIAGNOSIS — D689 Coagulation defect, unspecified: Secondary | ICD-10-CM | POA: Diagnosis not present

## 2019-06-04 DIAGNOSIS — Z4901 Encounter for fitting and adjustment of extracorporeal dialysis catheter: Secondary | ICD-10-CM | POA: Diagnosis not present

## 2019-06-04 DIAGNOSIS — N186 End stage renal disease: Secondary | ICD-10-CM | POA: Diagnosis not present

## 2019-06-04 DIAGNOSIS — Z992 Dependence on renal dialysis: Secondary | ICD-10-CM | POA: Diagnosis not present

## 2019-06-04 MED ORDER — PREDNISONE 10 MG (21) PO TBPK: ORAL_TABLET | ORAL | 0 refills | Status: DC

## 2019-06-04 NOTE — Patient Instructions (Signed)
   Tennis Elbow Tennis elbow is swelling (inflammation) in your outer forearm, near your elbow. Swelling affects the tissues that connect muscle to bone (tendons). Tennis elbow can happen in any sport or job in which you use your elbow too much. It is caused by doing the same motion over and over. Tennis elbow can cause:  Pain and tenderness in your forearm and the outer part of your elbow. You may have pain all the time, or only when using the arm.  A burning feeling. This runs from your elbow through your arm.  Weak grip in your hand. Follow these instructions at home: Activity  Rest your elbow and wrist. Avoid activities that cause problems, as told by your doctor.  If told by your doctor, wear an elbow strap to reduce stress on the area.  Do physical therapy exercises as told.  If you lift an object, lift it with your palm facing up. This is easier on your elbow. Lifestyle  If your tennis elbow is caused by sports, check your equipment and make sure that: ? You are using it correctly. ? It fits you well.  If your tennis elbow is caused by work or by using a computer, take breaks often to stretch your arm. Talk with your manager about how you can manage your condition at work. If you have a brace:  Wear the brace as told by your doctor. Remove it only as told by your doctor.  Loosen the brace if your fingers tingle, get numb, or turn cold and blue.  Keep the brace clean.  If the brace is not waterproof, ask your doctor if you may take the brace off for bathing. If you must keep the brace on while bathing: ? Do not let it get wet. ? Cover it with a watertight covering when you take a bath or a shower. General instructions   If told, put ice on the painful area: ? Put ice in a plastic bag. ? Place a towel between your skin and the bag. ? Leave the ice on for 20 minutes, 2-3 times a day.  Take over-the-counter and prescription medicines only as told by your  doctor.  Keep all follow-up visits as told by your doctor. This is important. Contact a doctor if:  Your pain does not get better with treatment.  Your pain gets worse.  You have weakness in your forearm, hand, or fingers.  You cannot feel your forearm, hand, or fingers. Summary  Tennis elbow is swelling (inflammation) in your outer forearm, near your elbow.  Tennis elbow is caused by doing the same motion over and over.  Rest your elbow and wrist. Avoid activities that cause problems, as told by your doctor.  If told, put ice on the painful area for 20 minutes, 2-3 times a day. This information is not intended to replace advice given to you by your health care provider. Make sure you discuss any questions you have with your health care provider. Document Revised: 12/15/2017 Document Reviewed: 01/03/2017 Elsevier Patient Education  [place].

## 2019-06-04 NOTE — Progress Notes (Signed)
Assessment & Plan:  1. Left tennis elbow - Education provided on tennis elbow. - predniSONE (STERAPRED UNI-PAK 21 TAB) 10 MG (21) TBPK tablet; As directed x 6 days  Dispense: 21 tablet; Refill: 0   Follow up plan: Return as scheduled.  [name], MSN, APRN, FNP-C [facility]  Subjective:   Patient ID: [name], female    DOB: 26-Mar-1941, 79 y.o.   MRN: [id number]  HPI: [name] is a 79 y.o. female presenting on 06/04/2019 for Elbow Pain (left- Knot on left elbow x 1 week) and Memory Loss (daughter states that it has been ongoing )  Patient reports left elbow swelling and bruising that has been present for the past week.  She denies any known injury.  There is also mention of a concern about memory loss.  Her daughter that is with her today states that this has been ongoing and she has seen no change, but the patient recently stayed with a different family member who insisted this be brought up.   ROS: Negative unless specifically indicated above in HPI.   Relevant past medical history reviewed and updated as indicated.   Allergies and medications reviewed and updated.   Current Outpatient Medications:  .  acetaminophen (TYLENOL) 500 MG tablet, Take 2 tablets (1,000 mg total) by mouth every 8 (eight) hours as needed for mild pain or headache., Disp: 30 tablet, Rfl: 0 .  albuterol (VENTOLIN HFA) 108 (90 Base) MCG/ACT inhaler, Inhale 2 puffs into the lungs every 6 (six) hours as needed for wheezing or shortness of breath., Disp: 18 g, Rfl: 2 .  alendronate (FOSAMAX) 70 MG tablet, Take 1 tablet (70 mg total) by mouth every Thursday., Disp: 12 tablet, Rfl: 3 .  aspirin EC 81 MG tablet, Take 81 mg by mouth daily., Disp: , Rfl:  .  budesonide-formoterol (SYMBICORT) 80-4.5 MCG/ACT inhaler, Inhale 2 puffs into the lungs 2 (two) times daily., Disp: 3 Inhaler, Rfl: 4 .  cinacalcet (SENSIPAR) 30 MG tablet, Take 1 tablet (30 mg total) by mouth every Monday,  Wednesday, and Friday., Disp: 60 tablet, Rfl: 0 .  citalopram (CELEXA) 40 MG tablet, Take 1 tablet (40 mg total) by mouth daily., Disp: 90 tablet, Rfl: 1 .  colchicine 0.6 MG tablet, Take 2 tablets by mouth at onset of gout flare and then 1 tablet 1 hour later. Use as needed., Disp: 15 tablet, Rfl: 1 .  docusate calcium (SURFAK) 240 MG capsule, Take 1 capsule (240 mg total) by mouth daily., Disp: 90 capsule, Rfl: 1 .  doxercalciferol (HECTOROL) 4 MCG/2ML injection, Inject 0.5 mLs (1 mcg total) into the vein every Monday, Wednesday, and Friday with hemodialysis., Disp: 2 mL, Rfl: 0 .  ezetimibe (ZETIA) 10 MG tablet, Take 1 tablet (10 mg total) by mouth daily., Disp: 90 tablet, Rfl: 3 .  famotidine (PEPCID) 20 MG tablet, TAKE 1 TABLET BY MOUTH TWICE A DAY, Disp: 180 tablet, Rfl: 1 .  HYDROcodone-acetaminophen (NORCO/VICODIN) 5-325 MG tablet, Take 1 tablet by mouth every 6 (six) hours as needed for moderate pain., Disp: 30 tablet, Rfl: 0 .  HYDROcodone-acetaminophen (NORCO/VICODIN) 5-325 MG tablet, Take 1 tablet by mouth every 6 (six) hours as needed for moderate pain., Disp: 30 tablet, Rfl: 0 .  HYDROcodone-acetaminophen (NORCO/VICODIN) 5-325 MG tablet, Take 1 tablet by mouth every 6 (six) hours as needed for moderate pain., Disp: 30 tablet, Rfl: 0 .  levothyroxine (SYNTHROID) 100 MCG tablet, Take 1 tablet (100 mcg total)  by mouth daily., Disp: 90 tablet, Rfl: 3 .  Multiple Vitamin (MULTIVITAMIN) tablet, Take 1 tablet by mouth daily., Disp: , Rfl:  .  multivitamin (RENA-VIT) TABS tablet, Take 1 tablet by mouth daily., Disp: , Rfl:  .  nystatin cream (MYCOSTATIN), Apply 1 application topically 2 (two) times daily as needed for dry skin., Disp: 30 g, Rfl: 0 .  OXYGEN, Inhale 3 L into the lungs continuous., Disp: , Rfl:  .  pantoprazole (PROTONIX) 20 MG tablet, Take 1 tablet (20 mg total) by mouth daily., Disp: 30 tablet, Rfl: 6 .  pramipexole (MIRAPEX) 0.125 MG tablet, TAKE 1 TABLET (0.125 MG TOTAL) BY  MOUTH AT BEDTIME., Disp: 90 tablet, Rfl: 1 .  Vitamin D, Ergocalciferol, (DRISDOL) 1.25 MG (50000 UT) CAPS capsule, Take 1 capsule (50,000 Units total) by mouth every Monday., Disp: 12 capsule, Rfl: 3 .  predniSONE (STERAPRED UNI-PAK 21 TAB) 10 MG (21) TBPK tablet, As directed x 6 days, Disp: 21 tablet, Rfl: 0  Allergies  Allergen Reactions  . Amoxicillin Rash    Has patient had a PCN reaction causing immediate rash, facial/tongue/throat swelling, SOB or lightheadedness with hypotension:YES Has patient had a PCN reaction causing severe rash involving mucus membranes or skin necrosis: Yes Has patient had a PCN reaction that required hospitalization No Has patient had a PCN reaction occurring within the last 10 years: Yes If all of the above answers are "NO", then may proceed with Cephalosporin use.   . Sulfa Drugs Cross Reactors Hives and Itching  . Percocet [Oxycodone-Acetaminophen] Itching and Rash    Did not happen last time she took it  . Sulfa Antibiotics Hives    Objective:   BP (!) 154/90 Comment: manual  Pulse 67   Temp 97.8 F (36.6 C) (Temporal)   Ht 5\' 5"  (1.651 m)   Wt 162 lb (73.5 kg)   SpO2 100%   BMI 26.96 kg/m    Physical Exam Vitals reviewed.  Constitutional:      General: She is not in acute distress.    Appearance: Normal appearance. She is not ill-appearing, toxic-appearing or diaphoretic.  HENT:     Head: Normocephalic and atraumatic.  Eyes:     General: No scleral icterus.       Right eye: No discharge.        Left eye: No discharge.     Conjunctiva/sclera: Conjunctivae normal.  Cardiovascular:     Rate and Rhythm: Normal rate.  Pulmonary:     Effort: Pulmonary effort is normal. No respiratory distress.  Musculoskeletal:        General: Normal range of motion.     Left elbow: Swelling present. Tenderness (just below lateral epicondyle) present.     Cervical back: Normal range of motion.  Skin:    General: Skin is warm and dry.     Capillary  Refill: Capillary refill takes less than 2 seconds.     Findings: Bruising (left elbow) present.  Neurological:     General: No focal deficit present.     Mental Status: She is alert and oriented to person, place, and time. Mental status is at baseline.  Psychiatric:        Mood and Affect: Mood normal.        Behavior: Behavior normal.        Thought Content: Thought content normal.        Judgment: Judgment normal.   MMSE: 26/30

## 2019-06-05 DIAGNOSIS — D631 Anemia in chronic kidney disease: Secondary | ICD-10-CM | POA: Diagnosis not present

## 2019-06-05 DIAGNOSIS — D689 Coagulation defect, unspecified: Secondary | ICD-10-CM | POA: Diagnosis not present

## 2019-06-05 DIAGNOSIS — Z992 Dependence on renal dialysis: Secondary | ICD-10-CM | POA: Diagnosis not present

## 2019-06-05 DIAGNOSIS — N2581 Secondary hyperparathyroidism of renal origin: Secondary | ICD-10-CM | POA: Diagnosis not present

## 2019-06-05 DIAGNOSIS — N186 End stage renal disease: Secondary | ICD-10-CM | POA: Diagnosis not present

## 2019-06-05 DIAGNOSIS — Z4901 Encounter for fitting and adjustment of extracorporeal dialysis catheter: Secondary | ICD-10-CM | POA: Diagnosis not present

## 2019-06-07 DIAGNOSIS — D689 Coagulation defect, unspecified: Secondary | ICD-10-CM | POA: Diagnosis not present

## 2019-06-07 DIAGNOSIS — N186 End stage renal disease: Secondary | ICD-10-CM | POA: Diagnosis not present

## 2019-06-07 DIAGNOSIS — Z4901 Encounter for fitting and adjustment of extracorporeal dialysis catheter: Secondary | ICD-10-CM | POA: Diagnosis not present

## 2019-06-07 DIAGNOSIS — D631 Anemia in chronic kidney disease: Secondary | ICD-10-CM | POA: Diagnosis not present

## 2019-06-07 DIAGNOSIS — N2581 Secondary hyperparathyroidism of renal origin: Secondary | ICD-10-CM | POA: Diagnosis not present

## 2019-06-07 DIAGNOSIS — Z992 Dependence on renal dialysis: Secondary | ICD-10-CM | POA: Diagnosis not present

## 2019-06-08 ENCOUNTER — Other Ambulatory Visit: Payer: Self-pay | Admitting: Family Medicine

## 2019-06-08 DIAGNOSIS — F419 Anxiety disorder, unspecified: Secondary | ICD-10-CM

## 2019-06-10 DIAGNOSIS — Z4901 Encounter for fitting and adjustment of extracorporeal dialysis catheter: Secondary | ICD-10-CM | POA: Diagnosis not present

## 2019-06-10 DIAGNOSIS — N186 End stage renal disease: Secondary | ICD-10-CM | POA: Diagnosis not present

## 2019-06-10 DIAGNOSIS — D631 Anemia in chronic kidney disease: Secondary | ICD-10-CM | POA: Diagnosis not present

## 2019-06-10 DIAGNOSIS — Z992 Dependence on renal dialysis: Secondary | ICD-10-CM | POA: Diagnosis not present

## 2019-06-10 DIAGNOSIS — N2581 Secondary hyperparathyroidism of renal origin: Secondary | ICD-10-CM | POA: Diagnosis not present

## 2019-06-10 DIAGNOSIS — D689 Coagulation defect, unspecified: Secondary | ICD-10-CM | POA: Diagnosis not present

## 2019-06-12 DIAGNOSIS — D631 Anemia in chronic kidney disease: Secondary | ICD-10-CM | POA: Diagnosis not present

## 2019-06-12 DIAGNOSIS — Z992 Dependence on renal dialysis: Secondary | ICD-10-CM | POA: Diagnosis not present

## 2019-06-12 DIAGNOSIS — N186 End stage renal disease: Secondary | ICD-10-CM | POA: Diagnosis not present

## 2019-06-12 DIAGNOSIS — Z4901 Encounter for fitting and adjustment of extracorporeal dialysis catheter: Secondary | ICD-10-CM | POA: Diagnosis not present

## 2019-06-12 DIAGNOSIS — D689 Coagulation defect, unspecified: Secondary | ICD-10-CM | POA: Diagnosis not present

## 2019-06-12 DIAGNOSIS — N2581 Secondary hyperparathyroidism of renal origin: Secondary | ICD-10-CM | POA: Diagnosis not present

## 2019-06-14 DIAGNOSIS — J449 Chronic obstructive pulmonary disease, unspecified: Secondary | ICD-10-CM | POA: Diagnosis not present

## 2019-06-14 DIAGNOSIS — Z4901 Encounter for fitting and adjustment of extracorporeal dialysis catheter: Secondary | ICD-10-CM | POA: Diagnosis not present

## 2019-06-14 DIAGNOSIS — Z992 Dependence on renal dialysis: Secondary | ICD-10-CM | POA: Diagnosis not present

## 2019-06-14 DIAGNOSIS — N2581 Secondary hyperparathyroidism of renal origin: Secondary | ICD-10-CM | POA: Diagnosis not present

## 2019-06-14 DIAGNOSIS — D689 Coagulation defect, unspecified: Secondary | ICD-10-CM | POA: Diagnosis not present

## 2019-06-14 DIAGNOSIS — N186 End stage renal disease: Secondary | ICD-10-CM | POA: Diagnosis not present

## 2019-06-14 DIAGNOSIS — D631 Anemia in chronic kidney disease: Secondary | ICD-10-CM | POA: Diagnosis not present

## 2019-06-17 DIAGNOSIS — N2581 Secondary hyperparathyroidism of renal origin: Secondary | ICD-10-CM | POA: Diagnosis not present

## 2019-06-17 DIAGNOSIS — D689 Coagulation defect, unspecified: Secondary | ICD-10-CM | POA: Diagnosis not present

## 2019-06-17 DIAGNOSIS — N186 End stage renal disease: Secondary | ICD-10-CM | POA: Diagnosis not present

## 2019-06-17 DIAGNOSIS — Z992 Dependence on renal dialysis: Secondary | ICD-10-CM | POA: Diagnosis not present

## 2019-06-17 DIAGNOSIS — Z4901 Encounter for fitting and adjustment of extracorporeal dialysis catheter: Secondary | ICD-10-CM | POA: Diagnosis not present

## 2019-06-17 DIAGNOSIS — D631 Anemia in chronic kidney disease: Secondary | ICD-10-CM | POA: Diagnosis not present

## 2019-06-18 DIAGNOSIS — N186 End stage renal disease: Secondary | ICD-10-CM | POA: Diagnosis not present

## 2019-06-18 DIAGNOSIS — I120 Hypertensive chronic kidney disease with stage 5 chronic kidney disease or end stage renal disease: Secondary | ICD-10-CM | POA: Diagnosis not present

## 2019-06-18 DIAGNOSIS — I480 Paroxysmal atrial fibrillation: Secondary | ICD-10-CM | POA: Diagnosis not present

## 2019-06-18 DIAGNOSIS — E1151 Type 2 diabetes mellitus with diabetic peripheral angiopathy without gangrene: Secondary | ICD-10-CM | POA: Diagnosis not present

## 2019-06-18 DIAGNOSIS — M1030 Gout due to renal impairment, unspecified site: Secondary | ICD-10-CM | POA: Diagnosis not present

## 2019-06-18 DIAGNOSIS — M1990 Unspecified osteoarthritis, unspecified site: Secondary | ICD-10-CM | POA: Diagnosis not present

## 2019-06-18 DIAGNOSIS — I872 Venous insufficiency (chronic) (peripheral): Secondary | ICD-10-CM | POA: Diagnosis not present

## 2019-06-18 DIAGNOSIS — E785 Hyperlipidemia, unspecified: Secondary | ICD-10-CM | POA: Diagnosis not present

## 2019-06-18 DIAGNOSIS — E039 Hypothyroidism, unspecified: Secondary | ICD-10-CM | POA: Diagnosis not present

## 2019-06-18 DIAGNOSIS — H9192 Unspecified hearing loss, left ear: Secondary | ICD-10-CM | POA: Diagnosis not present

## 2019-06-18 DIAGNOSIS — J449 Chronic obstructive pulmonary disease, unspecified: Secondary | ICD-10-CM | POA: Diagnosis not present

## 2019-06-18 DIAGNOSIS — E1122 Type 2 diabetes mellitus with diabetic chronic kidney disease: Secondary | ICD-10-CM | POA: Diagnosis not present

## 2019-06-18 DIAGNOSIS — D631 Anemia in chronic kidney disease: Secondary | ICD-10-CM | POA: Diagnosis not present

## 2019-06-19 DIAGNOSIS — N186 End stage renal disease: Secondary | ICD-10-CM | POA: Diagnosis not present

## 2019-06-19 DIAGNOSIS — Z992 Dependence on renal dialysis: Secondary | ICD-10-CM | POA: Diagnosis not present

## 2019-06-19 DIAGNOSIS — D689 Coagulation defect, unspecified: Secondary | ICD-10-CM | POA: Diagnosis not present

## 2019-06-19 DIAGNOSIS — N2581 Secondary hyperparathyroidism of renal origin: Secondary | ICD-10-CM | POA: Diagnosis not present

## 2019-06-19 DIAGNOSIS — D631 Anemia in chronic kidney disease: Secondary | ICD-10-CM | POA: Diagnosis not present

## 2019-06-19 DIAGNOSIS — Z4901 Encounter for fitting and adjustment of extracorporeal dialysis catheter: Secondary | ICD-10-CM | POA: Diagnosis not present

## 2019-06-21 DIAGNOSIS — Z992 Dependence on renal dialysis: Secondary | ICD-10-CM | POA: Diagnosis not present

## 2019-06-21 DIAGNOSIS — Z4901 Encounter for fitting and adjustment of extracorporeal dialysis catheter: Secondary | ICD-10-CM | POA: Diagnosis not present

## 2019-06-21 DIAGNOSIS — D689 Coagulation defect, unspecified: Secondary | ICD-10-CM | POA: Diagnosis not present

## 2019-06-21 DIAGNOSIS — D631 Anemia in chronic kidney disease: Secondary | ICD-10-CM | POA: Diagnosis not present

## 2019-06-21 DIAGNOSIS — N2581 Secondary hyperparathyroidism of renal origin: Secondary | ICD-10-CM | POA: Diagnosis not present

## 2019-06-21 DIAGNOSIS — N186 End stage renal disease: Secondary | ICD-10-CM | POA: Diagnosis not present

## 2019-06-24 DIAGNOSIS — Z992 Dependence on renal dialysis: Secondary | ICD-10-CM | POA: Diagnosis not present

## 2019-06-24 DIAGNOSIS — N2581 Secondary hyperparathyroidism of renal origin: Secondary | ICD-10-CM | POA: Diagnosis not present

## 2019-06-24 DIAGNOSIS — D631 Anemia in chronic kidney disease: Secondary | ICD-10-CM | POA: Diagnosis not present

## 2019-06-24 DIAGNOSIS — D689 Coagulation defect, unspecified: Secondary | ICD-10-CM | POA: Diagnosis not present

## 2019-06-24 DIAGNOSIS — Z4901 Encounter for fitting and adjustment of extracorporeal dialysis catheter: Secondary | ICD-10-CM | POA: Diagnosis not present

## 2019-06-24 DIAGNOSIS — N186 End stage renal disease: Secondary | ICD-10-CM | POA: Diagnosis not present

## 2019-06-25 ENCOUNTER — Telehealth: Payer: Self-pay | Admitting: Family Medicine

## 2019-06-25 NOTE — Chronic Care Management (AMB) (Signed)
  Chronic Care Management   Note  06/25/2019 Name: LASHAWNE DURA MRN: [id number] DOB: 12-21-1940  [name] is a 79 y.o. year old female who is a primary care patient of [name], FNP. I reached out to [name] by phone today in response to a referral sent by Ms. [employer] health plan.     Ms. Hinchey daughter in law charlotte was given information about Chronic Care Management services today including:  1. CCM service includes personalized support from designated clinical staff supervised by her physician, including individualized plan of care and coordination with other care providers 2. 24/7 contact phone numbers for assistance for urgent and routine care needs. 3. Service will only be billed when office clinical staff spend 20 minutes or more in a month to coordinate care. 4. Only one practitioner may furnish and bill the service in a calendar month. 5. The patient may stop CCM services at any time (effective at the end of the month) by phone call to the office staff. 6. The patient will be responsible for cost sharing (co-pay) of up to 20% of the service fee (after annual deductible is met).  Patient's daughter in law Baldo Ash did not agree to enrollment in care management services and does not wish to consider at this time.  Follow up plan: The patient has been provided with contact information for the care management team and has been advised to call with any health related questions or concerns.   Noreene Larsson, [place], [place], [place] [address] Direct Dial: [phone] [email or web address] Website: [email or web address]

## 2019-06-26 DIAGNOSIS — D631 Anemia in chronic kidney disease: Secondary | ICD-10-CM | POA: Diagnosis not present

## 2019-06-26 DIAGNOSIS — N186 End stage renal disease: Secondary | ICD-10-CM | POA: Diagnosis not present

## 2019-06-26 DIAGNOSIS — N2581 Secondary hyperparathyroidism of renal origin: Secondary | ICD-10-CM | POA: Diagnosis not present

## 2019-06-26 DIAGNOSIS — Z4901 Encounter for fitting and adjustment of extracorporeal dialysis catheter: Secondary | ICD-10-CM | POA: Diagnosis not present

## 2019-06-26 DIAGNOSIS — Z992 Dependence on renal dialysis: Secondary | ICD-10-CM | POA: Diagnosis not present

## 2019-06-26 DIAGNOSIS — D689 Coagulation defect, unspecified: Secondary | ICD-10-CM | POA: Diagnosis not present

## 2019-06-28 DIAGNOSIS — Z4901 Encounter for fitting and adjustment of extracorporeal dialysis catheter: Secondary | ICD-10-CM | POA: Diagnosis not present

## 2019-06-28 DIAGNOSIS — Z992 Dependence on renal dialysis: Secondary | ICD-10-CM | POA: Diagnosis not present

## 2019-06-28 DIAGNOSIS — N2581 Secondary hyperparathyroidism of renal origin: Secondary | ICD-10-CM | POA: Diagnosis not present

## 2019-06-28 DIAGNOSIS — D631 Anemia in chronic kidney disease: Secondary | ICD-10-CM | POA: Diagnosis not present

## 2019-06-28 DIAGNOSIS — D689 Coagulation defect, unspecified: Secondary | ICD-10-CM | POA: Diagnosis not present

## 2019-06-28 DIAGNOSIS — N186 End stage renal disease: Secondary | ICD-10-CM | POA: Diagnosis not present

## 2019-07-01 DIAGNOSIS — N186 End stage renal disease: Secondary | ICD-10-CM | POA: Diagnosis not present

## 2019-07-01 DIAGNOSIS — D631 Anemia in chronic kidney disease: Secondary | ICD-10-CM | POA: Diagnosis not present

## 2019-07-01 DIAGNOSIS — D689 Coagulation defect, unspecified: Secondary | ICD-10-CM | POA: Diagnosis not present

## 2019-07-01 DIAGNOSIS — Z4901 Encounter for fitting and adjustment of extracorporeal dialysis catheter: Secondary | ICD-10-CM | POA: Diagnosis not present

## 2019-07-01 DIAGNOSIS — N2581 Secondary hyperparathyroidism of renal origin: Secondary | ICD-10-CM | POA: Diagnosis not present

## 2019-07-01 DIAGNOSIS — Z992 Dependence on renal dialysis: Secondary | ICD-10-CM | POA: Diagnosis not present

## 2019-07-03 DIAGNOSIS — Z992 Dependence on renal dialysis: Secondary | ICD-10-CM | POA: Diagnosis not present

## 2019-07-03 DIAGNOSIS — N2581 Secondary hyperparathyroidism of renal origin: Secondary | ICD-10-CM | POA: Diagnosis not present

## 2019-07-03 DIAGNOSIS — N186 End stage renal disease: Secondary | ICD-10-CM | POA: Diagnosis not present

## 2019-07-03 DIAGNOSIS — Z4901 Encounter for fitting and adjustment of extracorporeal dialysis catheter: Secondary | ICD-10-CM | POA: Diagnosis not present

## 2019-07-03 DIAGNOSIS — D631 Anemia in chronic kidney disease: Secondary | ICD-10-CM | POA: Diagnosis not present

## 2019-07-03 DIAGNOSIS — D689 Coagulation defect, unspecified: Secondary | ICD-10-CM | POA: Diagnosis not present

## 2019-07-03 DIAGNOSIS — E1122 Type 2 diabetes mellitus with diabetic chronic kidney disease: Secondary | ICD-10-CM | POA: Diagnosis not present

## 2019-07-04 DIAGNOSIS — I480 Paroxysmal atrial fibrillation: Secondary | ICD-10-CM | POA: Diagnosis not present

## 2019-07-05 DIAGNOSIS — N186 End stage renal disease: Secondary | ICD-10-CM | POA: Diagnosis not present

## 2019-07-05 DIAGNOSIS — N2581 Secondary hyperparathyroidism of renal origin: Secondary | ICD-10-CM | POA: Diagnosis not present

## 2019-07-05 DIAGNOSIS — Z4901 Encounter for fitting and adjustment of extracorporeal dialysis catheter: Secondary | ICD-10-CM | POA: Diagnosis not present

## 2019-07-05 DIAGNOSIS — D631 Anemia in chronic kidney disease: Secondary | ICD-10-CM | POA: Diagnosis not present

## 2019-07-05 DIAGNOSIS — E1121 Type 2 diabetes mellitus with diabetic nephropathy: Secondary | ICD-10-CM | POA: Diagnosis not present

## 2019-07-05 DIAGNOSIS — Z992 Dependence on renal dialysis: Secondary | ICD-10-CM | POA: Diagnosis not present

## 2019-07-05 DIAGNOSIS — D509 Iron deficiency anemia, unspecified: Secondary | ICD-10-CM | POA: Diagnosis not present

## 2019-07-05 DIAGNOSIS — D689 Coagulation defect, unspecified: Secondary | ICD-10-CM | POA: Diagnosis not present

## 2019-07-05 DIAGNOSIS — N185 Chronic kidney disease, stage 5: Secondary | ICD-10-CM | POA: Diagnosis not present

## 2019-07-08 DIAGNOSIS — Z992 Dependence on renal dialysis: Secondary | ICD-10-CM | POA: Diagnosis not present

## 2019-07-08 DIAGNOSIS — N2581 Secondary hyperparathyroidism of renal origin: Secondary | ICD-10-CM | POA: Diagnosis not present

## 2019-07-08 DIAGNOSIS — N184 Chronic kidney disease, stage 4 (severe): Secondary | ICD-10-CM | POA: Diagnosis not present

## 2019-07-08 DIAGNOSIS — M1990 Unspecified osteoarthritis, unspecified site: Secondary | ICD-10-CM | POA: Diagnosis not present

## 2019-07-08 DIAGNOSIS — E039 Hypothyroidism, unspecified: Secondary | ICD-10-CM | POA: Diagnosis not present

## 2019-07-08 DIAGNOSIS — N186 End stage renal disease: Secondary | ICD-10-CM | POA: Diagnosis not present

## 2019-07-08 DIAGNOSIS — D631 Anemia in chronic kidney disease: Secondary | ICD-10-CM | POA: Diagnosis not present

## 2019-07-08 DIAGNOSIS — M79602 Pain in left arm: Secondary | ICD-10-CM | POA: Diagnosis not present

## 2019-07-08 DIAGNOSIS — D509 Iron deficiency anemia, unspecified: Secondary | ICD-10-CM | POA: Diagnosis not present

## 2019-07-08 DIAGNOSIS — Z4901 Encounter for fitting and adjustment of extracorporeal dialysis catheter: Secondary | ICD-10-CM | POA: Diagnosis not present

## 2019-07-08 DIAGNOSIS — D689 Coagulation defect, unspecified: Secondary | ICD-10-CM | POA: Diagnosis not present

## 2019-07-08 DIAGNOSIS — Z86718 Personal history of other venous thrombosis and embolism: Secondary | ICD-10-CM | POA: Diagnosis not present

## 2019-07-08 DIAGNOSIS — E1121 Type 2 diabetes mellitus with diabetic nephropathy: Secondary | ICD-10-CM | POA: Diagnosis not present

## 2019-07-08 DIAGNOSIS — I4891 Unspecified atrial fibrillation: Secondary | ICD-10-CM | POA: Diagnosis not present

## 2019-07-08 DIAGNOSIS — M7989 Other specified soft tissue disorders: Secondary | ICD-10-CM | POA: Diagnosis not present

## 2019-07-08 DIAGNOSIS — E7800 Pure hypercholesterolemia, unspecified: Secondary | ICD-10-CM | POA: Diagnosis not present

## 2019-07-08 DIAGNOSIS — N185 Chronic kidney disease, stage 5: Secondary | ICD-10-CM | POA: Diagnosis not present

## 2019-07-09 ENCOUNTER — Other Ambulatory Visit: Payer: Self-pay

## 2019-07-09 ENCOUNTER — Ambulatory Visit (HOSPITAL_COMMUNITY)
Admission: RE | Admit: 2019-07-09 | Discharge: 2019-07-09 | Disposition: A | Discharge status: Home / Self Care | Payer: Medicare Other | Source: Ambulatory Visit | Attending: Family Medicine | Admitting: Family Medicine

## 2019-07-09 ENCOUNTER — Encounter: Payer: Self-pay | Admitting: Family Medicine

## 2019-07-09 ENCOUNTER — Telehealth: Payer: Self-pay | Admitting: Family Medicine

## 2019-07-09 ENCOUNTER — Telehealth (INDEPENDENT_AMBULATORY_CARE_PROVIDER_SITE_OTHER): Payer: Medicare Other | Admitting: Family Medicine

## 2019-07-09 DIAGNOSIS — N186 End stage renal disease: Secondary | ICD-10-CM | POA: Diagnosis not present

## 2019-07-09 DIAGNOSIS — E1151 Type 2 diabetes mellitus with diabetic peripheral angiopathy without gangrene: Secondary | ICD-10-CM | POA: Diagnosis not present

## 2019-07-09 DIAGNOSIS — E039 Hypothyroidism, unspecified: Secondary | ICD-10-CM | POA: Diagnosis not present

## 2019-07-09 DIAGNOSIS — H9192 Unspecified hearing loss, left ear: Secondary | ICD-10-CM | POA: Diagnosis not present

## 2019-07-09 DIAGNOSIS — I872 Venous insufficiency (chronic) (peripheral): Secondary | ICD-10-CM | POA: Diagnosis not present

## 2019-07-09 DIAGNOSIS — M7989 Other specified soft tissue disorders: Secondary | ICD-10-CM | POA: Insufficient documentation

## 2019-07-09 DIAGNOSIS — M1030 Gout due to renal impairment, unspecified site: Secondary | ICD-10-CM | POA: Diagnosis not present

## 2019-07-09 DIAGNOSIS — J449 Chronic obstructive pulmonary disease, unspecified: Secondary | ICD-10-CM | POA: Diagnosis not present

## 2019-07-09 DIAGNOSIS — E785 Hyperlipidemia, unspecified: Secondary | ICD-10-CM | POA: Diagnosis not present

## 2019-07-09 DIAGNOSIS — M79602 Pain in left arm: Secondary | ICD-10-CM | POA: Insufficient documentation

## 2019-07-09 DIAGNOSIS — M1990 Unspecified osteoarthritis, unspecified site: Secondary | ICD-10-CM | POA: Diagnosis not present

## 2019-07-09 DIAGNOSIS — I120 Hypertensive chronic kidney disease with stage 5 chronic kidney disease or end stage renal disease: Secondary | ICD-10-CM | POA: Diagnosis not present

## 2019-07-09 DIAGNOSIS — I480 Paroxysmal atrial fibrillation: Secondary | ICD-10-CM | POA: Diagnosis not present

## 2019-07-09 DIAGNOSIS — D631 Anemia in chronic kidney disease: Secondary | ICD-10-CM | POA: Diagnosis not present

## 2019-07-09 DIAGNOSIS — E1122 Type 2 diabetes mellitus with diabetic chronic kidney disease: Secondary | ICD-10-CM | POA: Diagnosis not present

## 2019-07-09 NOTE — Progress Notes (Signed)
Virtual Visit via Video note  I connected with [name]'s daughter on 07/09/19 at 8:49 AM by video and verified that I am speaking with the correct person using two identifiers. Shelia Roman is currently located at home and her daughter is currently with her during visit. The provider, [name], FNP is located in their home at time of visit.  I discussed the limitations, risks, security and privacy concerns of performing an evaluation and management service by video and the availability of in person appointments. I also discussed with the patient that there may be a patient responsible charge related to this service. The patient expressed understanding and agreed to proceed.  Subjective: PCP: [name], FNP  Chief Complaint  Patient presents with  . Arm Swelling   Patient called the on-call provider last night with concerns about pain, swelling, and warmth of the left arm from the elbow up to the shoulder. Patient has a prescription for Hydrocodone and took 1.5 of these without any relief from the pain. Denies erythema. She was told to go to the closest ER. Patient went to [facility] where she was told she had a DVT and was given one dose of Xarelto. Her daughter reports they did not do an ultrasound or give her a prescription of Xarelto to continue. Patient does have a history of A-Fib but was taking off of Coumadin per daughter because her arm would just split open and start bleeding.    ROS: Per HPI  Current Outpatient Medications:  .  acetaminophen (TYLENOL) 500 MG tablet, Take 2 tablets (1,000 mg total) by mouth every 8 (eight) hours as needed for mild pain or headache., Disp: 30 tablet, Rfl: 0 .  albuterol (VENTOLIN HFA) 108 (90 Base) MCG/ACT inhaler, Inhale 2 puffs into the lungs every 6 (six) hours as needed for wheezing or shortness of breath., Disp: 18 g, Rfl: 2 .  alendronate (FOSAMAX) 70 MG tablet, Take 1 tablet (70 mg total) by mouth every Thursday., Disp:  12 tablet, Rfl: 3 .  aspirin EC 81 MG tablet, Take 81 mg by mouth daily., Disp: , Rfl:  .  budesonide-formoterol (SYMBICORT) 80-4.5 MCG/ACT inhaler, Inhale 2 puffs into the lungs 2 (two) times daily., Disp: 3 Inhaler, Rfl: 4 .  cinacalcet (SENSIPAR) 30 MG tablet, Take 1 tablet (30 mg total) by mouth every Monday, Wednesday, and Friday., Disp: 60 tablet, Rfl: 0 .  citalopram (CELEXA) 40 MG tablet, TAKE 1 TABLET BY MOUTH EVERY DAY, Disp: 90 tablet, Rfl: 0 .  colchicine 0.6 MG tablet, Take 2 tablets by mouth at onset of gout flare and then 1 tablet 1 hour later. Use as needed., Disp: 15 tablet, Rfl: 1 .  docusate calcium (SURFAK) 240 MG capsule, Take 1 capsule (240 mg total) by mouth daily., Disp: 90 capsule, Rfl: 1 .  doxercalciferol (HECTOROL) 4 MCG/2ML injection, Inject 0.5 mLs (1 mcg total) into the vein every Monday, Wednesday, and Friday with hemodialysis., Disp: 2 mL, Rfl: 0 .  ezetimibe (ZETIA) 10 MG tablet, Take 1 tablet (10 mg total) by mouth daily., Disp: 90 tablet, Rfl: 3 .  famotidine (PEPCID) 20 MG tablet, TAKE 1 TABLET BY MOUTH TWICE A DAY, Disp: 180 tablet, Rfl: 1 .  HYDROcodone-acetaminophen (NORCO/VICODIN) 5-325 MG tablet, Take 1 tablet by mouth every 6 (six) hours as needed for moderate pain., Disp: 30 tablet, Rfl: 0 .  HYDROcodone-acetaminophen (NORCO/VICODIN) 5-325 MG tablet, Take 1 tablet by mouth every 6 (six) hours as  needed for moderate pain., Disp: 30 tablet, Rfl: 0 .  HYDROcodone-acetaminophen (NORCO/VICODIN) 5-325 MG tablet, Take 1 tablet by mouth every 6 (six) hours as needed for moderate pain., Disp: 30 tablet, Rfl: 0 .  levothyroxine (SYNTHROID) 100 MCG tablet, Take 1 tablet (100 mcg total) by mouth daily., Disp: 90 tablet, Rfl: 3 .  Multiple Vitamin (MULTIVITAMIN) tablet, Take 1 tablet by mouth daily., Disp: , Rfl:  .  multivitamin (RENA-VIT) TABS tablet, Take 1 tablet by mouth daily., Disp: , Rfl:  .  nystatin cream (MYCOSTATIN), Apply 1 application topically 2 (two)  times daily as needed for dry skin., Disp: 30 g, Rfl: 0 .  OXYGEN, Inhale 3 L into the lungs continuous., Disp: , Rfl:  .  pantoprazole (PROTONIX) 20 MG tablet, Take 1 tablet (20 mg total) by mouth daily., Disp: 30 tablet, Rfl: 6 .  pramipexole (MIRAPEX) 0.125 MG tablet, TAKE 1 TABLET (0.125 MG TOTAL) BY MOUTH AT BEDTIME., Disp: 90 tablet, Rfl: 1 .  predniSONE (STERAPRED UNI-PAK 21 TAB) 10 MG (21) TBPK tablet, As directed x 6 days, Disp: 21 tablet, Rfl: 0 .  Vitamin D, Ergocalciferol, (DRISDOL) 1.25 MG (50000 UT) CAPS capsule, Take 1 capsule (50,000 Units total) by mouth every Monday., Disp: 12 capsule, Rfl: 3  Allergies  Allergen Reactions  . Amoxicillin Rash    Has patient had a PCN reaction causing immediate rash, facial/tongue/throat swelling, SOB or lightheadedness with hypotension:YES Has patient had a PCN reaction causing severe rash involving mucus membranes or skin necrosis: Yes Has patient had a PCN reaction that required hospitalization No Has patient had a PCN reaction occurring within the last 10 years: Yes If all of the above answers are "NO", then may proceed with Cephalosporin use.   . Sulfa Drugs Cross Reactors Hives and Itching  . Percocet [Oxycodone-Acetaminophen] Itching and Rash    Did not happen last time she took it  . Sulfa Antibiotics Hives   Past Medical History:  Diagnosis Date  . Acute respiratory failure ([name]) 05/2017  . Anemia   . Anxiety   . Arthritis   . COPD (chronic obstructive pulmonary disease) ([place])   . Depression   . ESRD (end stage renal disease) ([place])    dialysis MWF  . GERD (gastroesophageal reflux disease)   . Gout   . History of blood transfusion   . History of diabetes mellitus    "diet controlled"  . HLD (hyperlipidemia)   . HOH (hard of hearing)    left ear  . Hypertension    hypotensive -since starting dialysis  . Hypothyroidism   . MRSA (methicillin resistant staph aureus) culture positive 06/01/2017  . PAF (paroxysmal atrial  fibrillation) ([place])    a. Echo 11/16:  Mild LVH, EF 55-60%, normal wall motion, MAC, mild MR, severe LAE (49 ml/m2), mild RVE, normal RVSF, mild RAE, mild TR, PASP 24 mmHg;  CHADS2-VASc: 4 >> Coumadin followed by PCP  . Pneumonia   . Renal disorder   . 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 Wears dentures   . Wears glasses     Observations/Objective: Unable to assess patient as I was speaking with daughter.   Assessment and Plan: 1. Pain and swelling of left upper extremity - Requesting ER notes. Ultrasound ordered STAT. Discussed if there is a DVT I will send prescription.  - US Venous Img Upper Uni Left (DVT)   Follow Up Instructions:   I discussed the assessment and treatment plan with the patient. The patient  was provided an opportunity to ask questions and all were answered. The patient agreed with the plan and demonstrated an understanding of the instructions.   The patient was advised to call back or seek an in-person evaluation if the symptoms worsen or if the condition fails to improve as anticipated.  The above assessment and management plan was discussed with the patient. The patient verbalized understanding of and has agreed to the management plan. Patient is aware to call the clinic if symptoms persist or worsen. Patient is aware when to return to the clinic for a follow-up visit. Patient educated on when it is appropriate to go to the emergency department.   Time call ended: 8:59 AM  I provided 12 minutes of face-to-face time during this encounter.   [name], MSN, APRN, FNP-C [facility] 07/09/19

## 2019-07-10 DIAGNOSIS — N2581 Secondary hyperparathyroidism of renal origin: Secondary | ICD-10-CM | POA: Diagnosis not present

## 2019-07-10 DIAGNOSIS — D631 Anemia in chronic kidney disease: Secondary | ICD-10-CM | POA: Diagnosis not present

## 2019-07-10 DIAGNOSIS — N185 Chronic kidney disease, stage 5: Secondary | ICD-10-CM | POA: Diagnosis not present

## 2019-07-10 DIAGNOSIS — E1121 Type 2 diabetes mellitus with diabetic nephropathy: Secondary | ICD-10-CM | POA: Diagnosis not present

## 2019-07-10 DIAGNOSIS — D509 Iron deficiency anemia, unspecified: Secondary | ICD-10-CM | POA: Diagnosis not present

## 2019-07-10 DIAGNOSIS — N186 End stage renal disease: Secondary | ICD-10-CM | POA: Diagnosis not present

## 2019-07-10 DIAGNOSIS — D689 Coagulation defect, unspecified: Secondary | ICD-10-CM | POA: Diagnosis not present

## 2019-07-10 DIAGNOSIS — Z992 Dependence on renal dialysis: Secondary | ICD-10-CM | POA: Diagnosis not present

## 2019-07-10 DIAGNOSIS — Z4901 Encounter for fitting and adjustment of extracorporeal dialysis catheter: Secondary | ICD-10-CM | POA: Diagnosis not present

## 2019-07-10 NOTE — Telephone Encounter (Signed)
Aware of results. 

## 2019-07-10 NOTE — Telephone Encounter (Signed)
No voice mail available to leave message for POA.   Result was:  Please let patient's daughter know there was no DVT. If symptoms continue I would recommend she come in for an in-person visit so we can assess.

## 2019-07-12 DIAGNOSIS — N186 End stage renal disease: Secondary | ICD-10-CM | POA: Diagnosis not present

## 2019-07-12 DIAGNOSIS — D689 Coagulation defect, unspecified: Secondary | ICD-10-CM | POA: Diagnosis not present

## 2019-07-12 DIAGNOSIS — N185 Chronic kidney disease, stage 5: Secondary | ICD-10-CM | POA: Diagnosis not present

## 2019-07-12 DIAGNOSIS — Z4901 Encounter for fitting and adjustment of extracorporeal dialysis catheter: Secondary | ICD-10-CM | POA: Diagnosis not present

## 2019-07-12 DIAGNOSIS — N2581 Secondary hyperparathyroidism of renal origin: Secondary | ICD-10-CM | POA: Diagnosis not present

## 2019-07-12 DIAGNOSIS — D509 Iron deficiency anemia, unspecified: Secondary | ICD-10-CM | POA: Diagnosis not present

## 2019-07-12 DIAGNOSIS — Z992 Dependence on renal dialysis: Secondary | ICD-10-CM | POA: Diagnosis not present

## 2019-07-12 DIAGNOSIS — D631 Anemia in chronic kidney disease: Secondary | ICD-10-CM | POA: Diagnosis not present

## 2019-07-12 DIAGNOSIS — E1121 Type 2 diabetes mellitus with diabetic nephropathy: Secondary | ICD-10-CM | POA: Diagnosis not present

## 2019-07-15 DIAGNOSIS — D689 Coagulation defect, unspecified: Secondary | ICD-10-CM | POA: Diagnosis not present

## 2019-07-15 DIAGNOSIS — E1121 Type 2 diabetes mellitus with diabetic nephropathy: Secondary | ICD-10-CM | POA: Diagnosis not present

## 2019-07-15 DIAGNOSIS — N2581 Secondary hyperparathyroidism of renal origin: Secondary | ICD-10-CM | POA: Diagnosis not present

## 2019-07-15 DIAGNOSIS — N185 Chronic kidney disease, stage 5: Secondary | ICD-10-CM | POA: Diagnosis not present

## 2019-07-15 DIAGNOSIS — Z4901 Encounter for fitting and adjustment of extracorporeal dialysis catheter: Secondary | ICD-10-CM | POA: Diagnosis not present

## 2019-07-15 DIAGNOSIS — N186 End stage renal disease: Secondary | ICD-10-CM | POA: Diagnosis not present

## 2019-07-15 DIAGNOSIS — D509 Iron deficiency anemia, unspecified: Secondary | ICD-10-CM | POA: Diagnosis not present

## 2019-07-15 DIAGNOSIS — Z992 Dependence on renal dialysis: Secondary | ICD-10-CM | POA: Diagnosis not present

## 2019-07-15 DIAGNOSIS — D631 Anemia in chronic kidney disease: Secondary | ICD-10-CM | POA: Diagnosis not present

## 2019-07-17 DIAGNOSIS — E1121 Type 2 diabetes mellitus with diabetic nephropathy: Secondary | ICD-10-CM | POA: Diagnosis not present

## 2019-07-17 DIAGNOSIS — N2581 Secondary hyperparathyroidism of renal origin: Secondary | ICD-10-CM | POA: Diagnosis not present

## 2019-07-17 DIAGNOSIS — D689 Coagulation defect, unspecified: Secondary | ICD-10-CM | POA: Diagnosis not present

## 2019-07-17 DIAGNOSIS — N186 End stage renal disease: Secondary | ICD-10-CM | POA: Diagnosis not present

## 2019-07-17 DIAGNOSIS — Z4901 Encounter for fitting and adjustment of extracorporeal dialysis catheter: Secondary | ICD-10-CM | POA: Diagnosis not present

## 2019-07-17 DIAGNOSIS — D509 Iron deficiency anemia, unspecified: Secondary | ICD-10-CM | POA: Diagnosis not present

## 2019-07-17 DIAGNOSIS — D631 Anemia in chronic kidney disease: Secondary | ICD-10-CM | POA: Diagnosis not present

## 2019-07-17 DIAGNOSIS — Z992 Dependence on renal dialysis: Secondary | ICD-10-CM | POA: Diagnosis not present

## 2019-07-17 DIAGNOSIS — N185 Chronic kidney disease, stage 5: Secondary | ICD-10-CM | POA: Diagnosis not present

## 2019-07-18 DIAGNOSIS — E1169 Type 2 diabetes mellitus with other specified complication: Secondary | ICD-10-CM | POA: Diagnosis not present

## 2019-07-19 DIAGNOSIS — D689 Coagulation defect, unspecified: Secondary | ICD-10-CM | POA: Diagnosis not present

## 2019-07-19 DIAGNOSIS — E1121 Type 2 diabetes mellitus with diabetic nephropathy: Secondary | ICD-10-CM | POA: Diagnosis not present

## 2019-07-19 DIAGNOSIS — D631 Anemia in chronic kidney disease: Secondary | ICD-10-CM | POA: Diagnosis not present

## 2019-07-19 DIAGNOSIS — Z992 Dependence on renal dialysis: Secondary | ICD-10-CM | POA: Diagnosis not present

## 2019-07-19 DIAGNOSIS — D509 Iron deficiency anemia, unspecified: Secondary | ICD-10-CM | POA: Diagnosis not present

## 2019-07-19 DIAGNOSIS — Z4901 Encounter for fitting and adjustment of extracorporeal dialysis catheter: Secondary | ICD-10-CM | POA: Diagnosis not present

## 2019-07-19 DIAGNOSIS — N185 Chronic kidney disease, stage 5: Secondary | ICD-10-CM | POA: Diagnosis not present

## 2019-07-19 DIAGNOSIS — N2581 Secondary hyperparathyroidism of renal origin: Secondary | ICD-10-CM | POA: Diagnosis not present

## 2019-07-19 DIAGNOSIS — N186 End stage renal disease: Secondary | ICD-10-CM | POA: Diagnosis not present

## 2019-07-22 DIAGNOSIS — Z992 Dependence on renal dialysis: Secondary | ICD-10-CM | POA: Diagnosis not present

## 2019-07-22 DIAGNOSIS — N185 Chronic kidney disease, stage 5: Secondary | ICD-10-CM | POA: Diagnosis not present

## 2019-07-22 DIAGNOSIS — N186 End stage renal disease: Secondary | ICD-10-CM | POA: Diagnosis not present

## 2019-07-22 DIAGNOSIS — N2581 Secondary hyperparathyroidism of renal origin: Secondary | ICD-10-CM | POA: Diagnosis not present

## 2019-07-22 DIAGNOSIS — D631 Anemia in chronic kidney disease: Secondary | ICD-10-CM | POA: Diagnosis not present

## 2019-07-22 DIAGNOSIS — Z4901 Encounter for fitting and adjustment of extracorporeal dialysis catheter: Secondary | ICD-10-CM | POA: Diagnosis not present

## 2019-07-22 DIAGNOSIS — D689 Coagulation defect, unspecified: Secondary | ICD-10-CM | POA: Diagnosis not present

## 2019-07-22 DIAGNOSIS — D509 Iron deficiency anemia, unspecified: Secondary | ICD-10-CM | POA: Diagnosis not present

## 2019-07-22 DIAGNOSIS — E1121 Type 2 diabetes mellitus with diabetic nephropathy: Secondary | ICD-10-CM | POA: Diagnosis not present

## 2019-07-24 DIAGNOSIS — D631 Anemia in chronic kidney disease: Secondary | ICD-10-CM | POA: Diagnosis not present

## 2019-07-24 DIAGNOSIS — Z4901 Encounter for fitting and adjustment of extracorporeal dialysis catheter: Secondary | ICD-10-CM | POA: Diagnosis not present

## 2019-07-24 DIAGNOSIS — D689 Coagulation defect, unspecified: Secondary | ICD-10-CM | POA: Diagnosis not present

## 2019-07-24 DIAGNOSIS — Z992 Dependence on renal dialysis: Secondary | ICD-10-CM | POA: Diagnosis not present

## 2019-07-24 DIAGNOSIS — N2581 Secondary hyperparathyroidism of renal origin: Secondary | ICD-10-CM | POA: Diagnosis not present

## 2019-07-24 DIAGNOSIS — D509 Iron deficiency anemia, unspecified: Secondary | ICD-10-CM | POA: Diagnosis not present

## 2019-07-24 DIAGNOSIS — N185 Chronic kidney disease, stage 5: Secondary | ICD-10-CM | POA: Diagnosis not present

## 2019-07-24 DIAGNOSIS — N186 End stage renal disease: Secondary | ICD-10-CM | POA: Diagnosis not present

## 2019-07-24 DIAGNOSIS — E1121 Type 2 diabetes mellitus with diabetic nephropathy: Secondary | ICD-10-CM | POA: Diagnosis not present

## 2019-07-25 ENCOUNTER — Other Ambulatory Visit: Payer: Self-pay | Admitting: Family Medicine

## 2019-07-25 DIAGNOSIS — K219 Gastro-esophageal reflux disease without esophagitis: Secondary | ICD-10-CM

## 2019-07-25 DIAGNOSIS — I70203 Unspecified atherosclerosis of native arteries of extremities, bilateral legs: Secondary | ICD-10-CM | POA: Diagnosis not present

## 2019-07-25 DIAGNOSIS — M79676 Pain in unspecified toe(s): Secondary | ICD-10-CM | POA: Diagnosis not present

## 2019-07-25 DIAGNOSIS — B351 Tinea unguium: Secondary | ICD-10-CM | POA: Diagnosis not present

## 2019-07-25 DIAGNOSIS — L84 Corns and callosities: Secondary | ICD-10-CM | POA: Diagnosis not present

## 2019-07-26 DIAGNOSIS — N185 Chronic kidney disease, stage 5: Secondary | ICD-10-CM | POA: Diagnosis not present

## 2019-07-26 DIAGNOSIS — N186 End stage renal disease: Secondary | ICD-10-CM | POA: Diagnosis not present

## 2019-07-26 DIAGNOSIS — D509 Iron deficiency anemia, unspecified: Secondary | ICD-10-CM | POA: Diagnosis not present

## 2019-07-26 DIAGNOSIS — D689 Coagulation defect, unspecified: Secondary | ICD-10-CM | POA: Diagnosis not present

## 2019-07-26 DIAGNOSIS — Z4901 Encounter for fitting and adjustment of extracorporeal dialysis catheter: Secondary | ICD-10-CM | POA: Diagnosis not present

## 2019-07-26 DIAGNOSIS — E1121 Type 2 diabetes mellitus with diabetic nephropathy: Secondary | ICD-10-CM | POA: Diagnosis not present

## 2019-07-26 DIAGNOSIS — D631 Anemia in chronic kidney disease: Secondary | ICD-10-CM | POA: Diagnosis not present

## 2019-07-26 DIAGNOSIS — N2581 Secondary hyperparathyroidism of renal origin: Secondary | ICD-10-CM | POA: Diagnosis not present

## 2019-07-26 DIAGNOSIS — Z992 Dependence on renal dialysis: Secondary | ICD-10-CM | POA: Diagnosis not present

## 2019-07-29 DIAGNOSIS — N2581 Secondary hyperparathyroidism of renal origin: Secondary | ICD-10-CM | POA: Diagnosis not present

## 2019-07-29 DIAGNOSIS — Z4901 Encounter for fitting and adjustment of extracorporeal dialysis catheter: Secondary | ICD-10-CM | POA: Diagnosis not present

## 2019-07-29 DIAGNOSIS — N186 End stage renal disease: Secondary | ICD-10-CM | POA: Diagnosis not present

## 2019-07-29 DIAGNOSIS — D509 Iron deficiency anemia, unspecified: Secondary | ICD-10-CM | POA: Diagnosis not present

## 2019-07-29 DIAGNOSIS — D689 Coagulation defect, unspecified: Secondary | ICD-10-CM | POA: Diagnosis not present

## 2019-07-29 DIAGNOSIS — Z992 Dependence on renal dialysis: Secondary | ICD-10-CM | POA: Diagnosis not present

## 2019-07-29 DIAGNOSIS — N185 Chronic kidney disease, stage 5: Secondary | ICD-10-CM | POA: Diagnosis not present

## 2019-07-29 DIAGNOSIS — E1121 Type 2 diabetes mellitus with diabetic nephropathy: Secondary | ICD-10-CM | POA: Diagnosis not present

## 2019-07-29 DIAGNOSIS — D631 Anemia in chronic kidney disease: Secondary | ICD-10-CM | POA: Diagnosis not present

## 2019-07-31 ENCOUNTER — Other Ambulatory Visit: Payer: Self-pay | Admitting: Family Medicine

## 2019-07-31 DIAGNOSIS — N185 Chronic kidney disease, stage 5: Secondary | ICD-10-CM | POA: Diagnosis not present

## 2019-07-31 DIAGNOSIS — E1121 Type 2 diabetes mellitus with diabetic nephropathy: Secondary | ICD-10-CM | POA: Diagnosis not present

## 2019-07-31 DIAGNOSIS — D631 Anemia in chronic kidney disease: Secondary | ICD-10-CM | POA: Diagnosis not present

## 2019-07-31 DIAGNOSIS — N2581 Secondary hyperparathyroidism of renal origin: Secondary | ICD-10-CM | POA: Diagnosis not present

## 2019-07-31 DIAGNOSIS — D509 Iron deficiency anemia, unspecified: Secondary | ICD-10-CM | POA: Diagnosis not present

## 2019-07-31 DIAGNOSIS — N186 End stage renal disease: Secondary | ICD-10-CM | POA: Diagnosis not present

## 2019-07-31 DIAGNOSIS — D689 Coagulation defect, unspecified: Secondary | ICD-10-CM | POA: Diagnosis not present

## 2019-07-31 DIAGNOSIS — G2581 Restless legs syndrome: Secondary | ICD-10-CM

## 2019-07-31 DIAGNOSIS — Z992 Dependence on renal dialysis: Secondary | ICD-10-CM | POA: Diagnosis not present

## 2019-07-31 DIAGNOSIS — Z4901 Encounter for fitting and adjustment of extracorporeal dialysis catheter: Secondary | ICD-10-CM | POA: Diagnosis not present

## 2019-08-02 DIAGNOSIS — D509 Iron deficiency anemia, unspecified: Secondary | ICD-10-CM | POA: Diagnosis not present

## 2019-08-02 DIAGNOSIS — E1121 Type 2 diabetes mellitus with diabetic nephropathy: Secondary | ICD-10-CM | POA: Diagnosis not present

## 2019-08-02 DIAGNOSIS — D631 Anemia in chronic kidney disease: Secondary | ICD-10-CM | POA: Diagnosis not present

## 2019-08-02 DIAGNOSIS — Z4901 Encounter for fitting and adjustment of extracorporeal dialysis catheter: Secondary | ICD-10-CM | POA: Diagnosis not present

## 2019-08-02 DIAGNOSIS — N186 End stage renal disease: Secondary | ICD-10-CM | POA: Diagnosis not present

## 2019-08-02 DIAGNOSIS — Z992 Dependence on renal dialysis: Secondary | ICD-10-CM | POA: Diagnosis not present

## 2019-08-02 DIAGNOSIS — E1122 Type 2 diabetes mellitus with diabetic chronic kidney disease: Secondary | ICD-10-CM | POA: Diagnosis not present

## 2019-08-02 DIAGNOSIS — N185 Chronic kidney disease, stage 5: Secondary | ICD-10-CM | POA: Diagnosis not present

## 2019-08-02 DIAGNOSIS — D689 Coagulation defect, unspecified: Secondary | ICD-10-CM | POA: Diagnosis not present

## 2019-08-02 DIAGNOSIS — N2581 Secondary hyperparathyroidism of renal origin: Secondary | ICD-10-CM | POA: Diagnosis not present

## 2019-08-05 DIAGNOSIS — D509 Iron deficiency anemia, unspecified: Secondary | ICD-10-CM | POA: Diagnosis not present

## 2019-08-05 DIAGNOSIS — D689 Coagulation defect, unspecified: Secondary | ICD-10-CM | POA: Diagnosis not present

## 2019-08-05 DIAGNOSIS — N2581 Secondary hyperparathyroidism of renal origin: Secondary | ICD-10-CM | POA: Diagnosis not present

## 2019-08-05 DIAGNOSIS — Z992 Dependence on renal dialysis: Secondary | ICD-10-CM | POA: Diagnosis not present

## 2019-08-05 DIAGNOSIS — D631 Anemia in chronic kidney disease: Secondary | ICD-10-CM | POA: Diagnosis not present

## 2019-08-05 DIAGNOSIS — Z4901 Encounter for fitting and adjustment of extracorporeal dialysis catheter: Secondary | ICD-10-CM | POA: Diagnosis not present

## 2019-08-05 DIAGNOSIS — N186 End stage renal disease: Secondary | ICD-10-CM | POA: Diagnosis not present

## 2019-08-06 ENCOUNTER — Encounter: Payer: Self-pay | Admitting: Cardiology

## 2019-08-06 ENCOUNTER — Ambulatory Visit (INDEPENDENT_AMBULATORY_CARE_PROVIDER_SITE_OTHER): Payer: Medicare Other | Admitting: *Deleted

## 2019-08-06 DIAGNOSIS — Z0000 Encounter for general adult medical examination without abnormal findings: Secondary | ICD-10-CM | POA: Diagnosis not present

## 2019-08-06 DIAGNOSIS — Z7189 Other specified counseling: Secondary | ICD-10-CM | POA: Insufficient documentation

## 2019-08-06 NOTE — Progress Notes (Addendum)
[place] VISIT  08/06/2019  Telephone Visit Disclaimer This Medicare AWV was conducted by telephone due to national recommendations for restrictions regarding the COVID-19 Pandemic (e.g. social distancing).  I verified, using two identifiers, that I am speaking with [name] or their authorized healthcare agent. I discussed the limitations, risks, security, and privacy concerns of performing an evaluation and management service by telephone and the potential availability of an in-person appointment in the future. The patient expressed understanding and agreed to proceed.   Subjective:  [name] is a 79 y.o. female patient of [name], FNP who had a Medicare Annual Wellness Visit today via telephone. [name] is retired and lives with her son and daughter in Sports [place].  She has 6 children. She reports that she is socially active and does interact with friends/family regularly. She is minimally physically active and enjoys watching TV.   Patient Care Team: [name], FNP as PCP - General (Family Medicine) Minus Breeding, MD as Consulting Physician (Cardiology) Tanda Rockers, MD as Consulting Physician (Pulmonary Disease) Jamal Maes, MD as Consulting Physician (Nephrology)  Advanced Directives 08/06/2019 09/06/2018 08/20/2018 08/19/2018 05/14/2018 05/14/2018 05/23/2017  Does Patient Have a Medical Advance Directive? No _0  Yes  Type of Advance Directive - [place];Out of facility DNR (pink MOST or yellow form) [place];Out of facility DNR (pink MOST or yellow form);Living will Out of facility DNR (pink MOST or yellow form);Healthcare Power of [place];Living will [place];Living will  Does patient want to make changes to medical advance directive? - No - Patient declined No - Patient declined - No - Patient declined No - Patient declined  No - Patient declined  Copy of [place] in Chart? - Yes - validated most recent copy scanned in chart (See row information) - No - copy requested Yes - validated most recent copy scanned in chart (See row information) Yes - validated most recent copy scanned in chart (See row information) Yes  Would patient like information on creating a medical advance directive? No - Patient declined No - Patient declined No - Patient declined No - Patient declined - - -  Pre-existing out of facility DNR order (yellow form or pink MOST form) - Yellow form placed in chart (order not valid for inpatient use) - Yellow form placed in chart (order not valid for inpatient use) - - [facility] Utilization Over the Past 12 Months: # of hospitalizations or ER visits: 1 # of surgeries: 0  Review of Systems    Patient reports that her overall health is unchanged compared to last year.  History obtained from chart review and the patient  Patient Reported Readings (BP, Pulse, CBG, Weight, etc) none  Pain Assessment Pain : No/denies pain     Current Medications & Allergies (verified) Allergies as of 08/06/2019       Reactions   Amoxicillin Rash   Has patient had a PCN reaction causing immediate rash, facial/tongue/throat swelling, SOB or lightheadedness with hypotension:YES Has patient had a PCN reaction causing severe rash involving mucus membranes or skin necrosis: Yes Has patient had a PCN reaction that required hospitalization No Has patient had a PCN reaction occurring within the last 10 years: Yes If all of the above answers are "NO", then may proceed with Cephalosporin use.   Sulfa Drugs [facility],  Itching   Percocet [oxycodone-acetaminophen] Itching, Rash   Did not happen last time she took it   Sulfa Antibiotics Hives        Medication List        Accurate as of Aug 06, 2019  2:33 PM. If you have any questions, ask your nurse or doctor.          STOP  taking these medications    colchicine 0.6 MG tablet   predniSONE 10 MG (21) Tbpk tablet Commonly known as: STERAPRED UNI-PAK 21 TAB       TAKE these medications    acetaminophen 500 MG tablet Commonly known as: TYLENOL Take 2 tablets (1,000 mg total) by mouth every 8 (eight) hours as needed for mild pain or headache.   albuterol 108 (90 Base) MCG/ACT inhaler Commonly known as: VENTOLIN HFA Inhale 2 puffs into the lungs every 6 (six) hours as needed for wheezing or shortness of breath.   alendronate 70 MG tablet Commonly known as: FOSAMAX Take 1 tablet (70 mg total) by mouth every Thursday.   aspirin EC 81 MG tablet Take 81 mg by mouth daily.   budesonide-formoterol 80-4.5 MCG/ACT inhaler Commonly known as: Symbicort Inhale 2 puffs into the lungs 2 (two) times daily.   cinacalcet 30 MG tablet Commonly known as: SENSIPAR Take 1 tablet (30 mg total) by mouth every Monday, Wednesday, and Friday.   citalopram 40 MG tablet Commonly known as: CELEXA TAKE 1 TABLET BY MOUTH EVERY DAY   docusate calcium 240 MG capsule Commonly known as: SURFAK Take 1 capsule (240 mg total) by mouth daily.   doxercalciferol 4 MCG/2ML injection Commonly known as: HECTOROL Inject 0.5 mLs (1 mcg total) into the vein every Monday, Wednesday, and Friday with hemodialysis.   ezetimibe 10 MG tablet Commonly known as: ZETIA Take 1 tablet (10 mg total) by mouth daily.   famotidine 20 MG tablet Commonly known as: PEPCID TAKE 1 TABLET BY MOUTH TWICE A DAY   HYDROcodone-acetaminophen 5-325 MG tablet Commonly known as: NORCO/VICODIN Take 1 tablet by mouth every 6 (six) hours as needed for moderate pain.   HYDROcodone-acetaminophen 5-325 MG tablet Commonly known as: NORCO/VICODIN Take 1 tablet by mouth every 6 (six) hours as needed for moderate pain.   HYDROcodone-acetaminophen 5-325 MG tablet Commonly known as: NORCO/VICODIN Take 1 tablet by mouth every 6 (six) hours as needed for moderate  pain.   levothyroxine 100 MCG tablet Commonly known as: SYNTHROID Take 1 tablet (100 mcg total) by mouth daily.   MIRCERA IJ Mircera   multivitamin tablet Take 1 tablet by mouth daily.   multivitamin Tabs tablet Take 1 tablet by mouth daily.   nystatin cream Commonly known as: MYCOSTATIN Apply 1 application topically 2 (two) times daily as needed for dry skin.   OXYGEN Inhale 3 L into the lungs continuous.   pantoprazole 20 MG tablet Commonly known as: PROTONIX TAKE 1 TABLET BY MOUTH EVERY DAY   pramipexole 0.125 MG tablet Commonly known as: MIRAPEX TAKE 1 TABLET (0.125 MG TOTAL) BY MOUTH AT BEDTIME.   Vitamin D (Ergocalciferol) 1.25 MG (50000 UNIT) Caps capsule Commonly known as: DRISDOL Take 1 capsule (50,000 Units total) by mouth every Monday.        History (reviewed): Past Medical History:  Diagnosis Date   Acute respiratory failure ([place]) 05/2017   Anemia    Anxiety    Arthritis    COPD (chronic obstructive pulmonary disease) (HCC)    Depression    ESRD (  end stage renal disease) ([place])    dialysis MWF   GERD (gastroesophageal reflux disease)    Gout    History of blood transfusion    History of diabetes mellitus    "diet controlled"   HLD (hyperlipidemia)    HOH (hard of hearing)    left ear   Hypertension    hypotensive -since starting dialysis   Hypothyroidism    MRSA (methicillin resistant staph aureus) culture positive 06/01/2017   PAF (paroxysmal atrial fibrillation) ([place])    a. 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   Wears dentures    Wears glasses    Past Surgical History:  Procedure Laterality Date   AV FISTULA PLACEMENT  04/03/2012   Procedure: ARTERIOVENOUS (AV) FISTULA CREATION;  Surgeon: Elam Dutch,  MD;  Location: [place];  Service: Vascular;  Laterality: Left;  creation left brachial cephalic fistula    BIOPSY  08/21/2018   Procedure: BIOPSY;  Surgeon: Thornton Park, MD;  Location: [place];  Service: Gastroenterology;;   CARPAL TUNNEL RELEASE Bilateral    COLONOSCOPY W/ POLYPECTOMY     COLONOSCOPY WITH PROPOFOL N/A 08/21/2018   Procedure: COLONOSCOPY WITH PROPOFOL;  Surgeon: Thornton Park, MD;  [facility] ENDOSCOPY;  Service: Gastroenterology;  Laterality: N/A;   DILATION AND CURETTAGE OF UTERUS     ESOPHAGOGASTRODUODENOSCOPY (EGD) WITH PROPOFOL N/A 08/21/2018   Procedure: ESOPHAGOGASTRODUODENOSCOPY (EGD) WITH PROPOFOL;  Surgeon: Thornton Park, MD;  Location: [place];  Service: Gastroenterology;  Laterality: N/A;   EYE SURGERY Bilateral    bilateral cataract removal   HEMATOMA EVACUATION Left 05/14/2018   Procedure: Incision and Drainage of Left Arm Hematoma;  Surgeon: Elam Dutch, MD;  Location: [place];  Service: Vascular;  Laterality: Left;   Hemodialysis  catheter Right    IR FLUORO GUIDE CV LINE RIGHT  05/26/2017   IR REMOVAL TUN CV CATH W/O FL  05/24/2017   IR US GUIDE VASC ACCESS RIGHT  05/26/2017   LIGATION OF ARTERIOVENOUS  FISTULA Left 02/04/2015   Procedure: LIGATION OF BRACHIOCEPHALIC ARTERIOVENOUS  FISTULA;  Surgeon: Conrad Woodlawn, MD;  Location: [place];  Service: Vascular;  Laterality: Left;   MULTIPLE TOOTH EXTRACTIONS     POLYPECTOMY  08/21/2018   Procedure: POLYPECTOMY;  Surgeon: Thornton Park, MD;  Location: [place];  Service: Gastroenterology;;   PORTACATH PLACEMENT     REVERSE SHOULDER ARTHROPLASTY Left 01/22/2016   Procedure: LEFT REVERSE SHOULDER ARTHROPLASTY;  Surgeon: Netta Cedars, MD;  Location: [place];  Service: Orthopedics;  Laterality: Left;   SHOULDER ARTHROSCOPY Bilateral    SPLIT NIGHT STUDY  07/26/2015   STERIOD INJECTION Right 01/22/2016   Procedure: RIGHT RING FINGER STEROID INJECTION;  Surgeon: Netta Cedars, MD;  Location: [place];  Service: Orthopedics;  Laterality: Right;   TEE WITHOUT CARDIOVERSION N/A 05/26/2017   Procedure: TRANSESOPHAGEAL ECHOCARDIOGRAM (TEE);  Surgeon: Lelon Perla, MD;  Location: [place];  Service: Cardiovascular;  Laterality: N/A;   THROMBECTOMY BRACHIAL ARTERY Left 02/06/2015   Procedure: EVACUATION OF LEFT ARM HEMATOMA;  Surgeon: Angelia Mould, MD;  Location: [place];  Service: Vascular;  Laterality: Left;   TOTAL KNEE ARTHROPLASTY Bilateral    TUBAL LIGATION     Family History  Problem Relation Age of Onset   Arthritis Mother    Diabetes Father        before age 43   Heart disease Father    Diabetes Sister    Cancer Brother    Hyperlipidemia Daughter    Hypertension Daughter    Diabetes Daughter    Hypertension Son    Hyperlipidemia Son    Dementia Sister    Cancer Sister        Unknown   Other Sister        Died in car accident   Stroke Brother    Diabetes Daughter    Hypertension Daughter    Hyperlipidemia Daughter    Hepatitis C Son    Social History   Socioeconomic History   Marital status: Widowed    Spouse name: Not on file   Number of children: 6   Years of education: 10   Highest education level: 10th grade  Occupational History   Occupation: RETIRED  Tobacco Use   Smoking status: Former Smoker    Packs/day: 0.25    Years: 2.00    Pack years: 0.50    Types: Cigarettes    Quit date: 04/04/1998    Years since quitting: 21.3   Smokeless tobacco: Never Used  Substance and Sexual Activity   Alcohol use: No    Alcohol/week: 0.0 standard drinks   Drug use: No   Sexual activity: Not Currently    Birth control/protection: None  Other Topics Concern   Not on file  Social History Narrative   ** Merged History Encounter **       Widowed 6 children Lives with son and daughter in law homemaker   Social Determinants of Health   Financial Resource Strain: Low Risk    Difficulty of Paying Living Expenses: Not hard at all  Food Insecurity:  No Food Insecurity   Worried About Charity fundraiser in the Last Year: Never true   Arboriculturist in the Last Year: Never true  Transportation Needs: No Transportation Needs   Lack of Transportation (Medical): No   Lack of Transportation (Non-Medical): No  Physical Activity: Inactive   Days of Exercise per Week: 0 days   Minutes of Exercise per Session: 0 min  Stress: No Stress Concern Present   Feeling of Stress : Not at all  Social Connections: Somewhat Isolated   Frequency of Communication with Friends and Family: More than three times a week   Frequency of Social Gatherings with Friends and Family: Three times a week   Attends Religious Services: More than 4 times per year   Active Member of Clubs or Organizations: No   Attends Archivist Meetings: Never   Marital Status: Widowed    Activities of Daily Living In your present state of health, do you have any difficulty performing the following activities: 08/06/2019 08/20/2018  Hearing? Y -  Comment total hearing loss left ear -  Vision? N -  Difficulty concentrating or making decisions? Y -  Comment some memory loss -  Walking or climbing stairs? Y -  Dressing or bathing? Y -  Comment cannot get in and out of tub -  Doing errands, shopping? Y Y  Comment - DOESN'T DRIVE=-HAS NEVER DRIVEN  Preparing Food and eating ? Y -  Using the Toilet? N -  In the past six months, have you accidently leaked urine? N -  Do you have problems with loss of bowel control? Y -  Comment sometimes -  Managing your Medications? N -  Managing your Finances? Y -  Comment daughter helps -  Housekeeping or managing your Housekeeping? Y -  Comment son and daughter handles -  Some recent data might be hidden    Patient Education/ Literacy How often do you need to have someone help you when you read instructions, pamphlets, or other written materials from your doctor or pharmacy?: 1 - Never What is the last grade level you completed in  school?: 10th  Exercise Current Exercise Habits: Home exercise routine, Type of exercise: stretching, Time (Minutes): 15, Frequency (Times/Week): 7, Weekly Exercise (Minutes/Week): 105, Intensity: Mild, Exercise limited by: orthopedic condition(s)  Diet Patient reports consuming 3 meals a day and 1 snack(s) a day Patient reports that her primary diet is: Regular Patient reports that she does have regular access to food.   Depression Screen PHQ 2/9 Scores 08/06/2019 06/04/2019 05/16/2019 04/26/2019 02/12/2019 11/09/2018 10/26/2018  PHQ - 2 Score 0 0 0 0 0 0 0  PHQ- 9 Score - - 3 - 5 - 4     Fall Risk Fall Risk  08/06/2019 06/04/2019 05/16/2019 04/26/2019 11/09/2018  Falls in the past year? 0 0 1 0 0  Number falls in past yr: - - - - -  Injury with Fall? - - - - -     Objective:  [name] seemed alert and oriented and she participated appropriately during our telephone visit.  Blood Pressure Weight BMI  BP Readings from Last 3 Encounters:  06/04/19 (!) 154/90  05/16/19 124/83  04/26/19 131/80   Wt Readings from Last 3 Encounters:  06/04/19 162 lb (73.5 kg)  05/16/19 157 lb (71.2 kg)  04/26/19 159 lb 3.2 oz (72.2 kg)   BMI Readings from Last 1 Encounters:  06/04/19 26.96 kg/m    *Unable to obtain current vital signs, weight, and BMI due to telephone visit type  Hearing/Vision  Nihira did not seem to have difficulty with hearing/understanding during the telephone conversation Reports that she has had a formal eye exam by an eye care professional within the past year Reports that she has not had a formal hearing evaluation within the past year *Unable to fully assess hearing and vision during telephone visit type  Cognitive Function: 6CIT Screen 08/06/2019  What Year? 0 points  What month? 3 points  What time? 0 points  Count back from 20 0 points  Months in reverse 0 points  Repeat phrase 4 points  Total Score 7   (Normal:0-7, Significant for Dysfunction: >8)  Normal Cognitive  Function Screening: Yes   Immunization & Health Maintenance Record Immunization History  Administered Date(s) Administered   Hepatitis B, adult 12/27/2014, 02/25/2015, 03/27/2015, 06/26/2015, 10/02/2015, 11/02/2015, 12/02/2015, 04/01/2016   Influenza Split 02/03/2015   Influenza Whole 01/05/2015, 01/03/2016   Influenza, High Dose Seasonal PF 12/26/2017, 12/21/2018   Influenza, Seasonal, Injecte, Preservative Fre 12/26/2016   Influenza,inj,quad, With Preservative 01/26/2018   Influenza-Unspecified 12/26/2016, 01/26/2018, 12/21/2018   Pneumococcal Conjugate-13 03/10/2015, 03/25/2015, 04/27/2016   Pneumococcal Polysaccharide-23 03/25/2014   Tdap 11/09/2018    Health Maintenance  Topic Date Due   FOOT EXAM  Never done   COVID-19 Vaccine (1) Never done   HEMOGLOBIN A1C  11/22/2017   INFLUENZA VACCINE  11/03/2019   OPHTHALMOLOGY EXAM  01/08/2020   TETANUS/TDAP  11/08/2028   DEXA SCAN  Completed   PNA vac Low Risk Adult  Completed       Assessment  This is a  routine wellness examination for TYESHA JOFFE.  Health Maintenance: Due or Overdue Health Maintenance Due  Topic Date Due   FOOT EXAM  Never done   COVID-19 Vaccine (1) Never done   HEMOGLOBIN A1C  11/22/2017    [name] does not need a referral for Community Assistance: Care Management:   no Social Work:    no Prescription Assistance:  no Nutrition/Diabetes Education:  no   Plan:  Personalized Goals Goals Addressed             This Visit's Progress    Patient Stated       08/06/2019 AWV Goal: Keep All Scheduled Appointments  Over the next year, patient will attend all scheduled appointments with their PCP and any specialists that they see.        Personalized Health Maintenance & Screening Recommendations    Lung Cancer Screening Recommended: no (Low Dose CT Chest recommended if Age 60-80 years, 30 pack-year currently smoking OR have quit w/in past 15 years) Hepatitis C Screening recommended:  no HIV Screening recommended: no  Advanced Directives: Written information was not prepared per patient's request.  Referrals & Orders No orders of the defined types were placed in this encounter.   Follow-up Plan Follow-up with [name], FNP as planned  I have personally reviewed and noted the following in the patient's chart:   Medical and social history Use of alcohol, tobacco or illicit drugs  Current medications and supplements Functional ability and status Nutritional status Physical activity Advanced directives List of other physicians Hospitalizations, surgeries, and ER visits in previous 12 months Vitals Screenings to include cognitive, depression, and falls Referrals and appointments  In addition, I have reviewed and discussed with [name] certain preventive protocols, quality metrics, and best practice recommendations. A written personalized care plan for preventive services as well as general preventive health recommendations is available and can be mailed to the patient at her request.      Baldomero Lamy, LPN  05/07/3433  I have reviewed and agree with the above AWV documentation.    Evelina Dun, FNP

## 2019-08-06 NOTE — Progress Notes (Signed)
Cardiology Office Note   Date:  08/07/2019   ID:  [name], DOB 11/03/1940, MRN [id number]  PCP:  [name], FNP  Cardiologist:   No primary care provider on file.  No chief complaint on file.     History of Present Illness: [name] is a 79 y.o. female who presents for ongoing assessment and management of atrial fibrillation, hypertension, and hyperlipidemia.  Since we last saw her in the office she has done okay.  She is on dialysis and she tolerates this.  She denies any palpitations, presyncope or syncope.  She does not have any problems with hypotension or arrhythmias on dialysis.  She is on oxygen 24/7.  She gets around slowly using a walker but she can go grocery shopping.  She does not have any chest pressure, neck or arm discomfort.  Past Medical History:  Diagnosis Date  . Acute respiratory failure ([place]) 05/2017  . Anemia   . Anxiety   . Arthritis   . COPD (chronic obstructive pulmonary disease) ([place])   . Depression   . ESRD (end stage renal disease) ([place])    dialysis MWF  . GERD (gastroesophageal reflux disease)   . Gout   . History of blood transfusion   . History of diabetes mellitus    "diet controlled"  . HLD (hyperlipidemia)   . HOH (hard of hearing)    left ear  . Hypertension    hypotensive -since starting dialysis  . Hypothyroidism   . MRSA (methicillin resistant staph aureus) culture positive 06/01/2017  . PAF (paroxysmal atrial fibrillation) ([place])    a. Echo 11/16:  Mild LVH, EF 55-60%, normal wall motion, MAC, mild MR, severe LAE (49 ml/m2), mild RVE, normal RVSF, mild RAE, mild TR, PASP 24 mmHg;  CHADS2-VASc: 4 >> Coumadin followed by PCP    Past Surgical History:  Procedure Laterality Date  . AV FISTULA PLACEMENT  04/03/2012   Procedure: ARTERIOVENOUS (AV) FISTULA CREATION;  Surgeon: Elam Dutch, MD;  Location: [place];  Service: Vascular;  Laterality: Left;  creation left brachial cephalic fistula   . BIOPSY  08/21/2018   Procedure: BIOPSY;  Surgeon: Thornton Park, MD;  Location: [place];  Service: Gastroenterology;;  . CARPAL TUNNEL RELEASE Bilateral   . COLONOSCOPY W/ POLYPECTOMY    . COLONOSCOPY WITH PROPOFOL N/A 08/21/2018   Procedure: COLONOSCOPY WITH PROPOFOL;  Surgeon: Thornton Park, MD;  Location: [place];  Service: Gastroenterology;  Laterality: N/A;  . DILATION AND CURETTAGE OF UTERUS    . ESOPHAGOGASTRODUODENOSCOPY (EGD) WITH PROPOFOL N/A 08/21/2018   Procedure: ESOPHAGOGASTRODUODENOSCOPY (EGD) WITH PROPOFOL;  Surgeon: Thornton Park, MD;  Location: [place];  Service: Gastroenterology;  Laterality: N/A;  . EYE SURGERY Bilateral    bilateral cataract removal  . HEMATOMA EVACUATION Left 05/14/2018   Procedure: Incision and Drainage of Left Arm Hematoma;  Surgeon: Elam Dutch, MD;  Location: [facility];  Service: Vascular;  Laterality: Left;  . Hemodialysis  catheter Right   . IR FLUORO GUIDE CV LINE RIGHT  05/26/2017  . IR REMOVAL TUN CV CATH W/O FL  05/24/2017  . IR US GUIDE VASC ACCESS RIGHT  05/26/2017  . LIGATION OF ARTERIOVENOUS  FISTULA Left 02/04/2015   Procedure: LIGATION OF BRACHIOCEPHALIC ARTERIOVENOUS  FISTULA;  Surgeon: Conrad [place], MD;  Location: [place];  Service: Vascular;  Laterality: Left;  Marland Kitchen MULTIPLE TOOTH EXTRACTIONS    . POLYPECTOMY  08/21/2018   Procedure: POLYPECTOMY;  Surgeon: Thornton Park,  MD;  Location: [place];  Service: Gastroenterology;;  . PORTACATH PLACEMENT    . REVERSE SHOULDER ARTHROPLASTY Left 01/22/2016   Procedure: LEFT REVERSE SHOULDER ARTHROPLASTY;  Surgeon: Netta Cedars, MD;  Location: [place];  Service: Orthopedics;  Laterality: Left;  . SHOULDER ARTHROSCOPY Bilateral   . SPLIT NIGHT STUDY  07/26/2015  . STERIOD INJECTION Right 01/22/2016   Procedure: RIGHT RING FINGER STEROID INJECTION;  Surgeon: Netta Cedars, MD;  Location: [place];  Service: Orthopedics;  Laterality: Right;  . TEE WITHOUT CARDIOVERSION N/A 05/26/2017   Procedure:  TRANSESOPHAGEAL ECHOCARDIOGRAM (TEE);  Surgeon: Lelon Perla, MD;  Location: [place];  Service: Cardiovascular;  Laterality: N/A;  . THROMBECTOMY BRACHIAL ARTERY Left 02/06/2015   Procedure: EVACUATION OF LEFT ARM HEMATOMA;  Surgeon: Angelia Mould, MD;  Location: [place];  Service: Vascular;  Laterality: Left;  . TOTAL KNEE ARTHROPLASTY Bilateral   . TUBAL LIGATION       Current Outpatient Medications  Medication Sig Dispense Refill  . acetaminophen (TYLENOL) 500 MG tablet Take 2 tablets (1,000 mg total) by mouth every 8 (eight) hours as needed for mild pain or headache. 30 tablet 0  . albuterol (VENTOLIN HFA) 108 (90 Base) MCG/ACT inhaler Inhale 2 puffs into the lungs every 6 (six) hours as needed for wheezing or shortness of breath. 18 g 2  . alendronate (FOSAMAX) 70 MG tablet Take 1 tablet (70 mg total) by mouth every Thursday. 12 tablet 3  . aspirin EC 81 MG tablet Take 81 mg by mouth daily.    . budesonide-formoterol (SYMBICORT) 80-4.5 MCG/ACT inhaler Inhale 2 puffs into the lungs 2 (two) times daily. 3 Inhaler 4  . cinacalcet (SENSIPAR) 30 MG tablet Take 1 tablet (30 mg total) by mouth every Monday, Wednesday, and Friday. 60 tablet 0  . citalopram (CELEXA) 40 MG tablet TAKE 1 TABLET BY MOUTH EVERY DAY 90 tablet 0  . docusate calcium (SURFAK) 240 MG capsule Take 1 capsule (240 mg total) by mouth daily. 90 capsule 1  . doxercalciferol (HECTOROL) 4 MCG/2ML injection Inject 0.5 mLs (1 mcg total) into the vein every Monday, Wednesday, and Friday with hemodialysis. 2 mL 0  . ezetimibe (ZETIA) 10 MG tablet Take 1 tablet (10 mg total) by mouth daily. 90 tablet 3  . famotidine (PEPCID) 20 MG tablet TAKE 1 TABLET BY MOUTH TWICE A DAY 180 tablet 1  . HYDROcodone-acetaminophen (NORCO/VICODIN) 5-325 MG tablet Take 1 tablet by mouth every 6 (six) hours as needed for moderate pain. 30 tablet 0  . levothyroxine (SYNTHROID) 100 MCG tablet Take 1 tablet (100 mcg total) by mouth daily. 90  tablet 3  . Methoxy PEG-Epoetin Beta (MIRCERA IJ) Mircera    . Multiple Vitamin (MULTIVITAMIN) tablet Take 1 tablet by mouth daily.    . multivitamin (RENA-VIT) TABS tablet Take 1 tablet by mouth daily.    Marland Kitchen nystatin cream (MYCOSTATIN) Apply 1 application topically 2 (two) times daily as needed for dry skin. 30 g 0  . pantoprazole (PROTONIX) 20 MG tablet TAKE 1 TABLET BY MOUTH EVERY DAY 90 tablet 1  . pramipexole (MIRAPEX) 0.125 MG tablet TAKE 1 TABLET (0.125 MG TOTAL) BY MOUTH AT BEDTIME. 90 tablet 0  . Vitamin D, Ergocalciferol, (DRISDOL) 1.25 MG (50000 UT) CAPS capsule Take 1 capsule (50,000 Units total) by mouth every Monday. 12 capsule 3  . HYDROcodone-acetaminophen (NORCO/VICODIN) 5-325 MG tablet Take 1 tablet by mouth every 6 (six) hours as needed for moderate pain. 30 tablet 0  .  HYDROcodone-acetaminophen (NORCO/VICODIN) 5-325 MG tablet Take 1 tablet by mouth every 6 (six) hours as needed for moderate pain. 30 tablet 0  . OXYGEN Inhale 3 L into the lungs continuous.     No current facility-administered medications for this visit.    Allergies:   Amoxicillin, Sulfa drugs cross reactors, Percocet [oxycodone-acetaminophen], and Sulfa antibiotics    ROS:  Please see the history of present illness.   Otherwise, review of systems are positive for none.   All other systems are reviewed and negative.    PHYSICAL EXAM: VS:  BP 90/60   Pulse 74   Ht _0  (1.651 m)   Wt 157 lb (71.2 kg)   BMI 26.13 kg/m  , BMI Body mass index is 26.13 kg/m. GEN:  No distress, slightly frail-appearing NECK:  No jugular venous distention at 90 degrees, waveform within normal limits, carotid upstroke brisk and symmetric, no bruits, no thyromegaly LYMPHATICS:  No cervical adenopathy LUNGS:  Clear to auscultation bilaterally BACK:  No CVA tenderness CHEST: Dialysis catheter HEART:  S1 and S2 within normal limits, no S3, no S4, no clicks, no rubs, no murmurs ABD:  Positive bowel sounds normal in frequency  in pitch, no bruits, no rebound, no guarding, unable to assess midline mass or bruit with the patient seated. EXT:  2 plus pulses throughout, no edema, no cyanosis no clubbing SKIN:  No rashes no nodules NEURO:  Cranial nerves II through XII grossly intact, motor grossly intact throughout PSYCH:  Cognitively intact, oriented to person place and time   EKG:  EKG is ordered today. The ekg ordered today demonstrates sinus rhythm, rate 74, left axis deviation, intervals within normal limits, no acute ST-T wave changes.   Recent Labs: 09/06/2018: ALT 15 02/04/2019: BUN 10; Creatinine, Ser 3.66; Hemoglobin 12.1; Magnesium 2.1; Platelets 210; Potassium 4.3; Sodium 141; TSH 2.020    Lipid Panel    Component Value Date/Time   CHOL 107 05/25/2017 0421   TRIG 88 05/25/2017 0421   HDL 32 (L) 05/25/2017 0421   CHOLHDL 3.3 05/25/2017 0421   VLDL 18 05/25/2017 0421   LDLCALC 57 05/25/2017 0421      Wt Readings from Last 3 Encounters:  08/07/19 157 lb (71.2 kg)  06/04/19 162 lb (73.5 kg)  05/16/19 157 lb (71.2 kg)      Other studies Reviewed: Additional studies/ records that were reviewed today include: None. Review of the above records demonstrates:  Please see elsewhere in the note.     ASSESSMENT AND PLAN:  PAF:  She is not having any paroxysms of this.  Given this and her frailty I am not considering anticoagulation.  No change in therapy.  Orthostatic Hypotension: She has had no symptomatic orthostasis.  No change in therapy.  Left arm fistula hematoma: Her fistula was removed.  This is healed.  Covid education: She had Covid couple months ago though she said it was a mild case.  She is looking into get the vaccine and we talked about this today.    Current medicines are reviewed at length with the patient today.  The patient does not have concerns regarding medicines.  The following changes have been made:  no change  Labs/ tests ordered today include: None  Orders  Placed This Encounter  Procedures  . EKG 12-Lead     Disposition:   FU with me as needed.      Signed, Minus Breeding, MD  08/07/2019 4:06 PM    [place]  [facility]

## 2019-08-07 ENCOUNTER — Encounter: Payer: Self-pay | Admitting: Cardiology

## 2019-08-07 ENCOUNTER — Other Ambulatory Visit: Payer: Self-pay

## 2019-08-07 ENCOUNTER — Ambulatory Visit (INDEPENDENT_AMBULATORY_CARE_PROVIDER_SITE_OTHER): Payer: Medicare Other | Admitting: Cardiology

## 2019-08-07 VITALS — BP 90/60 | HR 74 | Ht 65.0 in | Wt 157.0 lb

## 2019-08-07 DIAGNOSIS — I10 Essential (primary) hypertension: Secondary | ICD-10-CM | POA: Diagnosis not present

## 2019-08-07 DIAGNOSIS — I951 Orthostatic hypotension: Secondary | ICD-10-CM

## 2019-08-07 DIAGNOSIS — Z7189 Other specified counseling: Secondary | ICD-10-CM | POA: Diagnosis not present

## 2019-08-07 DIAGNOSIS — Z4901 Encounter for fitting and adjustment of extracorporeal dialysis catheter: Secondary | ICD-10-CM | POA: Diagnosis not present

## 2019-08-07 DIAGNOSIS — N2581 Secondary hyperparathyroidism of renal origin: Secondary | ICD-10-CM | POA: Diagnosis not present

## 2019-08-07 DIAGNOSIS — N186 End stage renal disease: Secondary | ICD-10-CM | POA: Diagnosis not present

## 2019-08-07 DIAGNOSIS — I480 Paroxysmal atrial fibrillation: Secondary | ICD-10-CM | POA: Diagnosis not present

## 2019-08-07 DIAGNOSIS — D689 Coagulation defect, unspecified: Secondary | ICD-10-CM | POA: Diagnosis not present

## 2019-08-07 DIAGNOSIS — D631 Anemia in chronic kidney disease: Secondary | ICD-10-CM | POA: Diagnosis not present

## 2019-08-07 DIAGNOSIS — D509 Iron deficiency anemia, unspecified: Secondary | ICD-10-CM | POA: Diagnosis not present

## 2019-08-07 DIAGNOSIS — Z992 Dependence on renal dialysis: Secondary | ICD-10-CM | POA: Diagnosis not present

## 2019-08-07 NOTE — Patient Instructions (Signed)
Medication Instructions:  The current medical regimen is effective;  continue present plan and medications.  *If you need a refill on your cardiac medications before your next appointment, please call your pharmacy*  Follow-Up: At [facility], you and your health needs are our priority.  As part of our continuing mission to provide you with exceptional heart care, we have created designated Provider Care Teams.  These Care Teams include your primary Cardiologist (physician) and Advanced Practice Providers (APPs -  Physician Assistants and Nurse Practitioners) who all work together to provide you with the care you need, when you need it.  We recommend signing up for the patient portal called "MyChart".  Sign up information is provided on this After Visit Summary.  MyChart is used to connect with patients for Virtual Visits (Telemedicine).  Patients are able to view lab/test results, encounter notes, upcoming appointments, etc.  Non-urgent messages can be sent to your provider as well.   To learn more about what you can do with MyChart, go to [email or web address].    As needed with Dr Percival Spanish  Thank you for choosing [facility]!!

## 2019-08-09 DIAGNOSIS — N186 End stage renal disease: Secondary | ICD-10-CM | POA: Diagnosis not present

## 2019-08-09 DIAGNOSIS — N2581 Secondary hyperparathyroidism of renal origin: Secondary | ICD-10-CM | POA: Diagnosis not present

## 2019-08-09 DIAGNOSIS — D689 Coagulation defect, unspecified: Secondary | ICD-10-CM | POA: Diagnosis not present

## 2019-08-09 DIAGNOSIS — Z4901 Encounter for fitting and adjustment of extracorporeal dialysis catheter: Secondary | ICD-10-CM | POA: Diagnosis not present

## 2019-08-09 DIAGNOSIS — Z992 Dependence on renal dialysis: Secondary | ICD-10-CM | POA: Diagnosis not present

## 2019-08-09 DIAGNOSIS — D631 Anemia in chronic kidney disease: Secondary | ICD-10-CM | POA: Diagnosis not present

## 2019-08-09 DIAGNOSIS — D509 Iron deficiency anemia, unspecified: Secondary | ICD-10-CM | POA: Diagnosis not present

## 2019-08-12 DIAGNOSIS — D689 Coagulation defect, unspecified: Secondary | ICD-10-CM | POA: Diagnosis not present

## 2019-08-12 DIAGNOSIS — Z4901 Encounter for fitting and adjustment of extracorporeal dialysis catheter: Secondary | ICD-10-CM | POA: Diagnosis not present

## 2019-08-12 DIAGNOSIS — D631 Anemia in chronic kidney disease: Secondary | ICD-10-CM | POA: Diagnosis not present

## 2019-08-12 DIAGNOSIS — Z992 Dependence on renal dialysis: Secondary | ICD-10-CM | POA: Diagnosis not present

## 2019-08-12 DIAGNOSIS — N2581 Secondary hyperparathyroidism of renal origin: Secondary | ICD-10-CM | POA: Diagnosis not present

## 2019-08-12 DIAGNOSIS — N186 End stage renal disease: Secondary | ICD-10-CM | POA: Diagnosis not present

## 2019-08-12 DIAGNOSIS — D509 Iron deficiency anemia, unspecified: Secondary | ICD-10-CM | POA: Diagnosis not present

## 2019-08-12 NOTE — Progress Notes (Deleted)
Assessment & Plan:  ***  No follow-ups on file.  [name], MSN, APRN, FNP-C [facility]  Subjective:    Patient ID: [name], female    DOB: [birth year], 79 y.o.   MRN: [id number]  Patient Care Team: [name], FNP as PCP - General (Family Medicine) Minus Breeding, MD as Consulting Physician (Cardiology) Tanda Rockers, MD as Consulting Physician (Pulmonary Disease) Jamal Maes, MD as Consulting Physician (Nephrology)   Chief Complaint: No chief complaint on file.   HPI: [name] is a 78 y.o. female presenting on 08/15/2019 for No chief complaint on file.  Pain assessment: Cause of pain- arthritis Pain location- back and knees Pain on scale of 1-10- 6/10 Frequency- daily What increases pain-decreased activity, heat, rubs What makes painbetter-Norco, stretching, massage Effects on ADL -able to tolerate to complete ADLs Any change in general medical condition- No  Current opioids rx-Norco 5/325 PO Q6H PRN # prescribed-30 Effectiveness of current meds- very effective Adverse reactions form pain meds- none Morphine equivalent- 5 MME/day  Pill count performed - No Last drug screen - 11/09/2018 ( high risk q84m moderate risk q641mlow risk yearly ) Urine drug screen today- No Was the [place]- Yes             If yes were their any concerning findings? - No   Overdose risk: 180  No flowsheet data found.  Pain contract signed on: 11/09/2018   New complaints: ***  Social history:  Relevant past medical, surgical, family and social history reviewed and updated as indicated. Interim medical history since our last visit reviewed.  Allergies and medications reviewed and updated.  DATA REVIEWED: CHART IN EPIC  ROS: Negative unless specifically indicated above in HPI.    Current Outpatient Medications:  .  acetaminophen (TYLENOL) 500 MG tablet, Take 2 tablets (1,000 mg total) by mouth every 8 (eight) hours  as needed for mild pain or headache., Disp: 30 tablet, Rfl: 0 .  albuterol (VENTOLIN HFA) 108 (90 Base) MCG/ACT inhaler, Inhale 2 puffs into the lungs every 6 (six) hours as needed for wheezing or shortness of breath., Disp: 18 g, Rfl: 2 .  alendronate (FOSAMAX) 70 MG tablet, Take 1 tablet (70 mg total) by mouth every Thursday., Disp: 12 tablet, Rfl: 3 .  aspirin EC 81 MG tablet, Take 81 mg by mouth daily., Disp: , Rfl:  .  budesonide-formoterol (SYMBICORT) 80-4.5 MCG/ACT inhaler, Inhale 2 puffs into the lungs 2 (two) times daily., Disp: 3 Inhaler, Rfl: 4 .  cinacalcet (SENSIPAR) 30 MG tablet, Take 1 tablet (30 mg total) by mouth every Monday, Wednesday, and Friday., Disp: 60 tablet, Rfl: 0 .  citalopram (CELEXA) 40 MG tablet, TAKE 1 TABLET BY MOUTH EVERY DAY, Disp: 90 tablet, Rfl: 0 .  docusate calcium (SURFAK) 240 MG capsule, Take 1 capsule (240 mg total) by mouth daily., Disp: 90 capsule, Rfl: 1 .  doxercalciferol (HECTOROL) 4 MCG/2ML injection, Inject 0.5 mLs (1 mcg total) into the vein every Monday, Wednesday, and Friday with hemodialysis., Disp: 2 mL, Rfl: 0 .  ezetimibe (ZETIA) 10 MG tablet, Take 1 tablet (10 mg total) by mouth daily., Disp: 90 tablet, Rfl: 3 .  famotidine (PEPCID) 20 MG tablet, TAKE 1 TABLET BY MOUTH TWICE A DAY, Disp: 180 tablet, Rfl: 1 .  HYDROcodone-acetaminophen (NORCO/VICODIN) 5-325 MG tablet, Take 1 tablet by mouth every 6 (six) hours as needed for moderate pain., Disp: 30 tablet, Rfl: 0 .  HYDROcodone-acetaminophen (  NORCO/VICODIN) 5-325 MG tablet, Take 1 tablet by mouth every 6 (six) hours as needed for moderate pain., Disp: 30 tablet, Rfl: 0 .  HYDROcodone-acetaminophen (NORCO/VICODIN) 5-325 MG tablet, Take 1 tablet by mouth every 6 (six) hours as needed for moderate pain., Disp: 30 tablet, Rfl: 0 .  levothyroxine (SYNTHROID) 100 MCG tablet, Take 1 tablet (100 mcg total) by mouth daily., Disp: 90 tablet, Rfl: 3 .  Methoxy PEG-Epoetin Beta (MIRCERA IJ), Mircera, Disp: ,  Rfl:  .  Multiple Vitamin (MULTIVITAMIN) tablet, Take 1 tablet by mouth daily., Disp: , Rfl:  .  multivitamin (RENA-VIT) TABS tablet, Take 1 tablet by mouth daily., Disp: , Rfl:  .  nystatin cream (MYCOSTATIN), Apply 1 application topically 2 (two) times daily as needed for dry skin., Disp: 30 g, Rfl: 0 .  OXYGEN, Inhale 3 L into the lungs continuous., Disp: , Rfl:  .  pantoprazole (PROTONIX) 20 MG tablet, TAKE 1 TABLET BY MOUTH EVERY DAY, Disp: 90 tablet, Rfl: 1 .  pramipexole (MIRAPEX) 0.125 MG tablet, TAKE 1 TABLET (0.125 MG TOTAL) BY MOUTH AT BEDTIME., Disp: 90 tablet, Rfl: 0 .  Vitamin D, Ergocalciferol, (DRISDOL) 1.25 MG (50000 UT) CAPS capsule, Take 1 capsule (50,000 Units total) by mouth every Monday., Disp: 12 capsule, Rfl: 3   Allergies  Allergen Reactions  . Amoxicillin Rash    Has patient had a PCN reaction causing immediate rash, facial/tongue/throat swelling, SOB or lightheadedness with hypotension:YES Has patient had a PCN reaction causing severe rash involving mucus membranes or skin necrosis: Yes Has patient had a PCN reaction that required hospitalization No Has patient had a PCN reaction occurring within the last 10 years: Yes If all of the above answers are "NO", then may proceed with Cephalosporin use.   . Sulfa Drugs Cross Reactors Hives and Itching  . Percocet [Oxycodone-Acetaminophen] Itching and Rash    Did not happen last time she took it  . Sulfa Antibiotics Hives   Past Medical History:  Diagnosis Date  . Acute respiratory failure ([place]) 05/2017  . Anemia   . Anxiety   . Arthritis   . COPD (chronic obstructive pulmonary disease) ([place])   . Depression   . ESRD (end stage renal disease) ([place])    dialysis MWF  . GERD (gastroesophageal reflux disease)   . Gout   . History of blood transfusion   . History of diabetes mellitus    "diet controlled"  . HLD (hyperlipidemia)   . HOH (hard of hearing)    left ear  . Hypertension    hypotensive -since starting  dialysis  . Hypothyroidism   . MRSA (methicillin resistant staph aureus) culture positive 06/01/2017  . PAF (paroxysmal atrial fibrillation) ([place])    a. Echo 11/16:  Mild LVH, EF 55-60%, normal wall motion, MAC, mild MR, severe LAE (49 ml/m2), mild RVE, normal RVSF, mild RAE, mild TR, PASP 24 mmHg;  CHADS2-VASc: 4 >> Coumadin followed by PCP    Past Surgical History:  Procedure Laterality Date  . AV FISTULA PLACEMENT  04/03/2012   Procedure: ARTERIOVENOUS (AV) FISTULA CREATION;  Surgeon: Elam Dutch, MD;  Location: [place];  Service: Vascular;  Laterality: Left;  creation left brachial cephalic fistula   . BIOPSY  08/21/2018   Procedure: BIOPSY;  Surgeon: Thornton Park, MD;  Location: [place];  Service: Gastroenterology;;  . CARPAL TUNNEL RELEASE Bilateral   . COLONOSCOPY W/ POLYPECTOMY    . COLONOSCOPY WITH PROPOFOL N/A 08/21/2018   Procedure: COLONOSCOPY WITH PROPOFOL;  Surgeon: Thornton Park, MD;  Location: [place];  Service: Gastroenterology;  Laterality: N/A;  . DILATION AND CURETTAGE OF UTERUS    . ESOPHAGOGASTRODUODENOSCOPY (EGD) WITH PROPOFOL N/A 08/21/2018   Procedure: ESOPHAGOGASTRODUODENOSCOPY (EGD) WITH PROPOFOL;  Surgeon: Thornton Park, MD;  Location: [place];  Service: Gastroenterology;  Laterality: N/A;  . EYE SURGERY Bilateral    bilateral cataract removal  . HEMATOMA EVACUATION Left 05/14/2018   Procedure: Incision and Drainage of Left Arm Hematoma;  Surgeon: Elam Dutch, MD;  Location: [facility];  Service: Vascular;  Laterality: Left;  . Hemodialysis  catheter Right   . IR FLUORO GUIDE CV LINE RIGHT  05/26/2017  . IR REMOVAL TUN CV CATH W/O FL  05/24/2017  . IR US GUIDE VASC ACCESS RIGHT  05/26/2017  . LIGATION OF ARTERIOVENOUS  FISTULA Left 02/04/2015   Procedure: LIGATION OF BRACHIOCEPHALIC ARTERIOVENOUS  FISTULA;  Surgeon: Conrad [place], MD;  Location: [place];  Service: Vascular;  Laterality: Left;  Marland Kitchen MULTIPLE TOOTH EXTRACTIONS    . POLYPECTOMY   08/21/2018   Procedure: POLYPECTOMY;  Surgeon: Thornton Park, MD;  Location: [place];  Service: Gastroenterology;;  . PORTACATH PLACEMENT    . REVERSE SHOULDER ARTHROPLASTY Left 01/22/2016   Procedure: LEFT REVERSE SHOULDER ARTHROPLASTY;  Surgeon: Netta Cedars, MD;  Location: [place];  Service: Orthopedics;  Laterality: Left;  . SHOULDER ARTHROSCOPY Bilateral   . SPLIT NIGHT STUDY  07/26/2015  . STERIOD INJECTION Right 01/22/2016   Procedure: RIGHT RING FINGER STEROID INJECTION;  Surgeon: Netta Cedars, MD;  Location: [place];  Service: Orthopedics;  Laterality: Right;  . TEE WITHOUT CARDIOVERSION N/A 05/26/2017   Procedure: TRANSESOPHAGEAL ECHOCARDIOGRAM (TEE);  Surgeon: Lelon Perla, MD;  Location: [place];  Service: Cardiovascular;  Laterality: N/A;  . THROMBECTOMY BRACHIAL ARTERY Left 02/06/2015   Procedure: EVACUATION OF LEFT ARM HEMATOMA;  Surgeon: Angelia Mould, MD;  Location: [place];  Service: Vascular;  Laterality: Left;  . TOTAL KNEE ARTHROPLASTY Bilateral   . TUBAL LIGATION      Social History   Socioeconomic History  . Marital status: Widowed    Spouse name: Not on file  . Number of children: 6  . Years of education: 75  . Highest education level: 10th grade  Occupational History  . Occupation: RETIRED  Tobacco Use  . Smoking status: Former Smoker    Packs/day: 0.25    Years: 2.00    Pack years: 0.50    Types: Cigarettes    Quit date: 04/04/1998    Years since quitting: 21.3  . Smokeless tobacco: Never Used  Substance and Sexual Activity  . Alcohol use: No    Alcohol/week: 0.0 standard drinks  . Drug use: No  . Sexual activity: Not Currently    Birth control/protection: None  Other Topics Concern  . Not on file  Social History Narrative   ** Merged History Encounter **       Widowed 6 children Lives with son and daughter in law homemaker   Social Determinants of Health   Financial Resource Strain: Low Risk   . Difficulty of Paying  Living Expenses: Not hard at all  Food Insecurity: No Food Insecurity  . Worried About Charity fundraiser in the Last Year: Never true  . Ran Out of Food in the Last Year: Never true  Transportation Needs: No Transportation Needs  . Lack of Transportation (Medical): No  . Lack of Transportation (Non-Medical): No  Physical Activity: Inactive  . Days  of Exercise per Week: 0 days  . Minutes of Exercise per Session: 0 min  Stress: No Stress Concern Present  . Feeling of Stress : Not at all  Social Connections: Somewhat Isolated  . Frequency of Communication with Friends and Family: More than three times a week  . Frequency of Social Gatherings with Friends and Family: Three times a week  . Attends Religious Services: More than 4 times per year  . Active Member of Clubs or Organizations: No  . Attends Archivist Meetings: Never  . Marital Status: Widowed  Intimate Partner Violence: Not At Risk  . Fear of Current or Ex-Partner: No  . Emotionally Abused: No  . Physically Abused: No  . Sexually Abused: No        Objective:    There were no vitals taken for this visit.  Wt Readings from Last 3 Encounters:  08/07/19 157 lb (71.2 kg)  06/04/19 162 lb (73.5 kg)  05/16/19 157 lb (71.2 kg)    Physical Exam  Lab Results  Component Value Date   TSH 2.020 02/04/2019   Lab Results  Component Value Date   WBC 7.4 02/04/2019   HGB 12.1 02/04/2019   HCT 37.3 02/04/2019   MCV 94 02/04/2019   PLT 210 02/04/2019   Lab Results  Component Value Date   NA 141 02/04/2019   K 4.3 02/04/2019   CO2 23 02/04/2019   GLUCOSE 88 02/04/2019   BUN 10 02/04/2019   CREATININE 3.66 (H) 02/04/2019   BILITOT 0.6 09/06/2018   ALKPHOS 140 (H) 09/06/2018   AST 21 09/06/2018   ALT 15 09/06/2018   PROT 6.0 (L) 09/06/2018   ALBUMIN 3.5 09/06/2018   CALCIUM 10.2 02/04/2019   ANIONGAP 9 09/06/2018   Lab Results  Component Value Date   CHOL 107 05/25/2017   Lab Results  Component  Value Date   HDL 32 (L) 05/25/2017   Lab Results  Component Value Date   LDLCALC 57 05/25/2017   Lab Results  Component Value Date   TRIG 88 05/25/2017   Lab Results  Component Value Date   CHOLHDL 3.3 05/25/2017   Lab Results  Component Value Date   HGBA1C 4.5 (L) 05/25/2017

## 2019-08-14 DIAGNOSIS — E1122 Type 2 diabetes mellitus with diabetic chronic kidney disease: Secondary | ICD-10-CM | POA: Diagnosis not present

## 2019-08-14 DIAGNOSIS — D689 Coagulation defect, unspecified: Secondary | ICD-10-CM | POA: Diagnosis not present

## 2019-08-14 DIAGNOSIS — I480 Paroxysmal atrial fibrillation: Secondary | ICD-10-CM | POA: Diagnosis not present

## 2019-08-14 DIAGNOSIS — E039 Hypothyroidism, unspecified: Secondary | ICD-10-CM | POA: Diagnosis not present

## 2019-08-14 DIAGNOSIS — M1990 Unspecified osteoarthritis, unspecified site: Secondary | ICD-10-CM | POA: Diagnosis not present

## 2019-08-14 DIAGNOSIS — I120 Hypertensive chronic kidney disease with stage 5 chronic kidney disease or end stage renal disease: Secondary | ICD-10-CM | POA: Diagnosis not present

## 2019-08-14 DIAGNOSIS — J449 Chronic obstructive pulmonary disease, unspecified: Secondary | ICD-10-CM | POA: Diagnosis not present

## 2019-08-14 DIAGNOSIS — E1151 Type 2 diabetes mellitus with diabetic peripheral angiopathy without gangrene: Secondary | ICD-10-CM | POA: Diagnosis not present

## 2019-08-14 DIAGNOSIS — D631 Anemia in chronic kidney disease: Secondary | ICD-10-CM | POA: Diagnosis not present

## 2019-08-14 DIAGNOSIS — I872 Venous insufficiency (chronic) (peripheral): Secondary | ICD-10-CM | POA: Diagnosis not present

## 2019-08-14 DIAGNOSIS — N2581 Secondary hyperparathyroidism of renal origin: Secondary | ICD-10-CM | POA: Diagnosis not present

## 2019-08-14 DIAGNOSIS — E785 Hyperlipidemia, unspecified: Secondary | ICD-10-CM | POA: Diagnosis not present

## 2019-08-14 DIAGNOSIS — Z4901 Encounter for fitting and adjustment of extracorporeal dialysis catheter: Secondary | ICD-10-CM | POA: Diagnosis not present

## 2019-08-14 DIAGNOSIS — M1030 Gout due to renal impairment, unspecified site: Secondary | ICD-10-CM | POA: Diagnosis not present

## 2019-08-14 DIAGNOSIS — D509 Iron deficiency anemia, unspecified: Secondary | ICD-10-CM | POA: Diagnosis not present

## 2019-08-14 DIAGNOSIS — H9192 Unspecified hearing loss, left ear: Secondary | ICD-10-CM | POA: Diagnosis not present

## 2019-08-14 DIAGNOSIS — Z992 Dependence on renal dialysis: Secondary | ICD-10-CM | POA: Diagnosis not present

## 2019-08-14 DIAGNOSIS — N186 End stage renal disease: Secondary | ICD-10-CM | POA: Diagnosis not present

## 2019-08-15 ENCOUNTER — Ambulatory Visit: Payer: Medicare Other | Admitting: Family Medicine

## 2019-08-15 DIAGNOSIS — E039 Hypothyroidism, unspecified: Secondary | ICD-10-CM

## 2019-08-15 DIAGNOSIS — E785 Hyperlipidemia, unspecified: Secondary | ICD-10-CM

## 2019-08-16 DIAGNOSIS — D631 Anemia in chronic kidney disease: Secondary | ICD-10-CM | POA: Diagnosis not present

## 2019-08-16 DIAGNOSIS — N2581 Secondary hyperparathyroidism of renal origin: Secondary | ICD-10-CM | POA: Diagnosis not present

## 2019-08-16 DIAGNOSIS — N186 End stage renal disease: Secondary | ICD-10-CM | POA: Diagnosis not present

## 2019-08-16 DIAGNOSIS — D689 Coagulation defect, unspecified: Secondary | ICD-10-CM | POA: Diagnosis not present

## 2019-08-16 DIAGNOSIS — Z992 Dependence on renal dialysis: Secondary | ICD-10-CM | POA: Diagnosis not present

## 2019-08-16 DIAGNOSIS — Z4901 Encounter for fitting and adjustment of extracorporeal dialysis catheter: Secondary | ICD-10-CM | POA: Diagnosis not present

## 2019-08-16 DIAGNOSIS — D509 Iron deficiency anemia, unspecified: Secondary | ICD-10-CM | POA: Diagnosis not present

## 2019-08-19 DIAGNOSIS — Z4901 Encounter for fitting and adjustment of extracorporeal dialysis catheter: Secondary | ICD-10-CM | POA: Diagnosis not present

## 2019-08-19 DIAGNOSIS — N186 End stage renal disease: Secondary | ICD-10-CM | POA: Diagnosis not present

## 2019-08-19 DIAGNOSIS — D631 Anemia in chronic kidney disease: Secondary | ICD-10-CM | POA: Diagnosis not present

## 2019-08-19 DIAGNOSIS — N2581 Secondary hyperparathyroidism of renal origin: Secondary | ICD-10-CM | POA: Diagnosis not present

## 2019-08-19 DIAGNOSIS — Z992 Dependence on renal dialysis: Secondary | ICD-10-CM | POA: Diagnosis not present

## 2019-08-19 DIAGNOSIS — D509 Iron deficiency anemia, unspecified: Secondary | ICD-10-CM | POA: Diagnosis not present

## 2019-08-19 DIAGNOSIS — D689 Coagulation defect, unspecified: Secondary | ICD-10-CM | POA: Diagnosis not present

## 2019-08-21 DIAGNOSIS — N2581 Secondary hyperparathyroidism of renal origin: Secondary | ICD-10-CM | POA: Diagnosis not present

## 2019-08-21 DIAGNOSIS — D509 Iron deficiency anemia, unspecified: Secondary | ICD-10-CM | POA: Diagnosis not present

## 2019-08-21 DIAGNOSIS — Z992 Dependence on renal dialysis: Secondary | ICD-10-CM | POA: Diagnosis not present

## 2019-08-21 DIAGNOSIS — Z4901 Encounter for fitting and adjustment of extracorporeal dialysis catheter: Secondary | ICD-10-CM | POA: Diagnosis not present

## 2019-08-21 DIAGNOSIS — N186 End stage renal disease: Secondary | ICD-10-CM | POA: Diagnosis not present

## 2019-08-21 DIAGNOSIS — D689 Coagulation defect, unspecified: Secondary | ICD-10-CM | POA: Diagnosis not present

## 2019-08-21 DIAGNOSIS — D631 Anemia in chronic kidney disease: Secondary | ICD-10-CM | POA: Diagnosis not present

## 2019-08-23 DIAGNOSIS — N2581 Secondary hyperparathyroidism of renal origin: Secondary | ICD-10-CM | POA: Diagnosis not present

## 2019-08-23 DIAGNOSIS — N186 End stage renal disease: Secondary | ICD-10-CM | POA: Diagnosis not present

## 2019-08-23 DIAGNOSIS — Z992 Dependence on renal dialysis: Secondary | ICD-10-CM | POA: Diagnosis not present

## 2019-08-23 DIAGNOSIS — D509 Iron deficiency anemia, unspecified: Secondary | ICD-10-CM | POA: Diagnosis not present

## 2019-08-23 DIAGNOSIS — D689 Coagulation defect, unspecified: Secondary | ICD-10-CM | POA: Diagnosis not present

## 2019-08-23 DIAGNOSIS — D631 Anemia in chronic kidney disease: Secondary | ICD-10-CM | POA: Diagnosis not present

## 2019-08-23 DIAGNOSIS — Z4901 Encounter for fitting and adjustment of extracorporeal dialysis catheter: Secondary | ICD-10-CM | POA: Diagnosis not present

## 2019-08-26 DIAGNOSIS — D631 Anemia in chronic kidney disease: Secondary | ICD-10-CM | POA: Diagnosis not present

## 2019-08-26 DIAGNOSIS — Z4901 Encounter for fitting and adjustment of extracorporeal dialysis catheter: Secondary | ICD-10-CM | POA: Diagnosis not present

## 2019-08-26 DIAGNOSIS — D509 Iron deficiency anemia, unspecified: Secondary | ICD-10-CM | POA: Diagnosis not present

## 2019-08-26 DIAGNOSIS — D689 Coagulation defect, unspecified: Secondary | ICD-10-CM | POA: Diagnosis not present

## 2019-08-26 DIAGNOSIS — N186 End stage renal disease: Secondary | ICD-10-CM | POA: Diagnosis not present

## 2019-08-26 DIAGNOSIS — N2581 Secondary hyperparathyroidism of renal origin: Secondary | ICD-10-CM | POA: Diagnosis not present

## 2019-08-26 DIAGNOSIS — Z992 Dependence on renal dialysis: Secondary | ICD-10-CM | POA: Diagnosis not present

## 2019-08-28 DIAGNOSIS — D689 Coagulation defect, unspecified: Secondary | ICD-10-CM | POA: Diagnosis not present

## 2019-08-28 DIAGNOSIS — Z4901 Encounter for fitting and adjustment of extracorporeal dialysis catheter: Secondary | ICD-10-CM | POA: Diagnosis not present

## 2019-08-28 DIAGNOSIS — N2581 Secondary hyperparathyroidism of renal origin: Secondary | ICD-10-CM | POA: Diagnosis not present

## 2019-08-28 DIAGNOSIS — N186 End stage renal disease: Secondary | ICD-10-CM | POA: Diagnosis not present

## 2019-08-28 DIAGNOSIS — D631 Anemia in chronic kidney disease: Secondary | ICD-10-CM | POA: Diagnosis not present

## 2019-08-28 DIAGNOSIS — I480 Paroxysmal atrial fibrillation: Secondary | ICD-10-CM | POA: Diagnosis not present

## 2019-08-28 DIAGNOSIS — D509 Iron deficiency anemia, unspecified: Secondary | ICD-10-CM | POA: Diagnosis not present

## 2019-08-28 DIAGNOSIS — Z992 Dependence on renal dialysis: Secondary | ICD-10-CM | POA: Diagnosis not present

## 2019-08-30 DIAGNOSIS — D631 Anemia in chronic kidney disease: Secondary | ICD-10-CM | POA: Diagnosis not present

## 2019-08-30 DIAGNOSIS — N2581 Secondary hyperparathyroidism of renal origin: Secondary | ICD-10-CM | POA: Diagnosis not present

## 2019-08-30 DIAGNOSIS — Z4901 Encounter for fitting and adjustment of extracorporeal dialysis catheter: Secondary | ICD-10-CM | POA: Diagnosis not present

## 2019-08-30 DIAGNOSIS — Z992 Dependence on renal dialysis: Secondary | ICD-10-CM | POA: Diagnosis not present

## 2019-08-30 DIAGNOSIS — D689 Coagulation defect, unspecified: Secondary | ICD-10-CM | POA: Diagnosis not present

## 2019-08-30 DIAGNOSIS — N186 End stage renal disease: Secondary | ICD-10-CM | POA: Diagnosis not present

## 2019-08-30 DIAGNOSIS — D509 Iron deficiency anemia, unspecified: Secondary | ICD-10-CM | POA: Diagnosis not present

## 2019-09-02 DIAGNOSIS — E1122 Type 2 diabetes mellitus with diabetic chronic kidney disease: Secondary | ICD-10-CM | POA: Diagnosis not present

## 2019-09-02 DIAGNOSIS — Z992 Dependence on renal dialysis: Secondary | ICD-10-CM | POA: Diagnosis not present

## 2019-09-02 DIAGNOSIS — Z4901 Encounter for fitting and adjustment of extracorporeal dialysis catheter: Secondary | ICD-10-CM | POA: Diagnosis not present

## 2019-09-02 DIAGNOSIS — N2581 Secondary hyperparathyroidism of renal origin: Secondary | ICD-10-CM | POA: Diagnosis not present

## 2019-09-02 DIAGNOSIS — N186 End stage renal disease: Secondary | ICD-10-CM | POA: Diagnosis not present

## 2019-09-02 DIAGNOSIS — D689 Coagulation defect, unspecified: Secondary | ICD-10-CM | POA: Diagnosis not present

## 2019-09-02 DIAGNOSIS — D509 Iron deficiency anemia, unspecified: Secondary | ICD-10-CM | POA: Diagnosis not present

## 2019-09-02 DIAGNOSIS — D631 Anemia in chronic kidney disease: Secondary | ICD-10-CM | POA: Diagnosis not present

## 2019-09-04 ENCOUNTER — Ambulatory Visit (INDEPENDENT_AMBULATORY_CARE_PROVIDER_SITE_OTHER): Payer: Medicare Other | Admitting: Family Medicine

## 2019-09-04 ENCOUNTER — Other Ambulatory Visit: Payer: Self-pay

## 2019-09-04 ENCOUNTER — Encounter: Payer: Self-pay | Admitting: Family Medicine

## 2019-09-04 VITALS — BP 148/83 | HR 65 | Temp 97.8°F | Ht 65.0 in | Wt 158.4 lb

## 2019-09-04 DIAGNOSIS — G8929 Other chronic pain: Secondary | ICD-10-CM

## 2019-09-04 DIAGNOSIS — M1990 Unspecified osteoarthritis, unspecified site: Secondary | ICD-10-CM | POA: Diagnosis not present

## 2019-09-04 DIAGNOSIS — D689 Coagulation defect, unspecified: Secondary | ICD-10-CM | POA: Diagnosis not present

## 2019-09-04 DIAGNOSIS — M546 Pain in thoracic spine: Secondary | ICD-10-CM | POA: Diagnosis not present

## 2019-09-04 DIAGNOSIS — Z79899 Other long term (current) drug therapy: Secondary | ICD-10-CM | POA: Diagnosis not present

## 2019-09-04 DIAGNOSIS — M810 Age-related osteoporosis without current pathological fracture: Secondary | ICD-10-CM | POA: Diagnosis not present

## 2019-09-04 DIAGNOSIS — G2581 Restless legs syndrome: Secondary | ICD-10-CM

## 2019-09-04 DIAGNOSIS — N186 End stage renal disease: Secondary | ICD-10-CM | POA: Diagnosis not present

## 2019-09-04 DIAGNOSIS — E785 Hyperlipidemia, unspecified: Secondary | ICD-10-CM

## 2019-09-04 DIAGNOSIS — L853 Xerosis cutis: Secondary | ICD-10-CM

## 2019-09-04 DIAGNOSIS — Z992 Dependence on renal dialysis: Secondary | ICD-10-CM

## 2019-09-04 DIAGNOSIS — F419 Anxiety disorder, unspecified: Secondary | ICD-10-CM

## 2019-09-04 DIAGNOSIS — F329 Major depressive disorder, single episode, unspecified: Secondary | ICD-10-CM

## 2019-09-04 DIAGNOSIS — E039 Hypothyroidism, unspecified: Secondary | ICD-10-CM

## 2019-09-04 DIAGNOSIS — D631 Anemia in chronic kidney disease: Secondary | ICD-10-CM | POA: Diagnosis not present

## 2019-09-04 DIAGNOSIS — N2581 Secondary hyperparathyroidism of renal origin: Secondary | ICD-10-CM | POA: Diagnosis not present

## 2019-09-04 DIAGNOSIS — Z4901 Encounter for fitting and adjustment of extracorporeal dialysis catheter: Secondary | ICD-10-CM | POA: Diagnosis not present

## 2019-09-04 DIAGNOSIS — J449 Chronic obstructive pulmonary disease, unspecified: Secondary | ICD-10-CM

## 2019-09-04 DIAGNOSIS — E559 Vitamin D deficiency, unspecified: Secondary | ICD-10-CM

## 2019-09-04 DIAGNOSIS — I10 Essential (primary) hypertension: Secondary | ICD-10-CM

## 2019-09-04 DIAGNOSIS — D509 Iron deficiency anemia, unspecified: Secondary | ICD-10-CM | POA: Diagnosis not present

## 2019-09-04 DIAGNOSIS — E1122 Type 2 diabetes mellitus with diabetic chronic kidney disease: Secondary | ICD-10-CM

## 2019-09-04 DIAGNOSIS — K219 Gastro-esophageal reflux disease without esophagitis: Secondary | ICD-10-CM

## 2019-09-04 MED ORDER — HYDROCODONE-ACETAMINOPHEN 5-325 MG PO TABS: 1.0000 | ORAL_TABLET | Freq: Four times a day (QID) | ORAL | 0 refills | Status: DC | PRN

## 2019-09-04 MED ORDER — ALENDRONATE SODIUM 70 MG PO TABS: 70.0000 mg | ORAL_TABLET | ORAL | 3 refills | Status: DC

## 2019-09-04 NOTE — Progress Notes (Signed)
Assessment & Plan:  1-3. Chronic midline thoracic back pain/Arthritis/Controlled substance agreement signed - Well controlled on current regimen.  - HYDROcodone-acetaminophen (NORCO/VICODIN) 5-325 MG tablet; Take 1 tablet by mouth every 6 (six) hours as needed for moderate pain.  Dispense: 30 tablet; Refill: 0 - HYDROcodone-acetaminophen (NORCO/VICODIN) 5-325 MG tablet; Take 1 tablet by mouth every 6 (six) hours as needed for moderate pain.  Dispense: 30 tablet; Refill: 0 - HYDROcodone-acetaminophen (NORCO/VICODIN) 5-325 MG tablet; Take 1 tablet by mouth every 6 (six) hours as needed for moderate pain.  Dispense: 30 tablet; Refill: 0  4. Age-related osteoporosis without current pathological fracture - Well controlled on current regimen.  - alendronate (FOSAMAX) 70 MG tablet; Take 1 tablet (70 mg total) by mouth every Thursday.  Dispense: 12 tablet; Refill: 3  5. Dry skin - Encouraged to apply good thick lotion in tub when skin is still slightly wet from bathing. Also discussed decreasing all over bathing to QOD and only washing private area/axilla daily to prevent from drying skin out. TSH ordered last month to be done at dialysis, but I do not have these results. We will call to check on this.  6. Essential hypertension - Well controlled on current regimen.   7. Dyslipidemia - Lipid panel ordered last month to be done at dialysis, but I do not have these results. We will call to check on this.  8. Chronic obstructive pulmonary disease, unspecified COPD type ([place]) - Well controlled on current regimen.   9. Gastroesophageal reflux disease without esophagitis - Well controlled on current regimen.   10. Hypothyroidism, unspecified type - TSH ordered last month to be done at dialysis, but I do not have these results. We will call to check on this.  11-12. 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  Managed by nephrologist.   13. Vitamin D deficiency - Well controlled on current regimen.   14. Restless leg syndrome - Well controlled on current regimen.   15. Reactive depression - Well controlled on current regimen.   16. Anxiety - Well controlled on current regimen.    Return in about 3 months (around 12/05/2019) for follow-up of chronic medication conditions/pain contract.  [name], MSN, APRN, FNP-C [facility]  Subjective:    Patient ID: [name], female    DOB: Aug 07, 1940, 79 y.o.   MRN: [id number]  Patient Care Team: [name], FNP as PCP - General (Family Medicine) Minus Breeding, MD as Consulting Physician (Cardiology) Tanda Rockers, MD as Consulting Physician (Pulmonary Disease) Jamal Maes, MD as Consulting Physician (Nephrology)   Chief Complaint:  Chief Complaint  Patient presents with  . Pain    3 month follow up    HPI: [name] is a 79 y.o. female presenting on 09/04/2019 for Pain (3 month follow up)  Pain assessment: Cause of pain- arthritis Pain location- back and knees Pain on scale of 1-10- 6/10 Frequency- daily What increases pain-decreased activity, heat, rubs What makes painbetter-Norco, stretching, massage Effects on ADL -able to tolerate to complete ADLs Any change in general medical condition- No  Current opioids rx-Norco 5/325 PO Q6H PRN # prescribed-30 Effectiveness of current meds- very effective Adverse reactions form pain meds- none Morphine equivalent- 5 MME/day  Pill count performed - No Last drug screen - 11/09/2018 ( high risk q22m moderate risk q69mlow risk yearly ) Urine drug screen today- No Was the  [place] reviewed- Yes             If yes were their any concerning findings? - No  Overdose risk: 270  Opioid Risk  09/04/2019  Alcohol 0  Illegal Drugs 0  Rx Drugs 0  Alcohol 0  Illegal Drugs 0  Rx Drugs 0  Age between 16-45 years  0  History of Preadolescent  Sexual Abuse 0  Psychological Disease 0  Depression 0  Opioid Risk Tool Scoring 0  Opioid Risk Interpretation Low Risk    Pain contract signed on: 11/09/2018   New complaints: Patient reports dry skin. She has tried numerous lotions including several of the thick creams in a tub. She does wash daily.   Social history:  Relevant past medical, surgical, family and social history reviewed and updated as indicated. Interim medical history since our last visit reviewed.  Allergies and medications reviewed and updated.  DATA REVIEWED: CHART IN EPIC  ROS: Negative unless specifically indicated above in HPI.    Current Outpatient Medications:  .  acetaminophen (TYLENOL) 500 MG tablet, Take 2 tablets (1,000 mg total) by mouth every 8 (eight) hours as needed for mild pain or headache., Disp: 30 tablet, Rfl: 0 .  albuterol (VENTOLIN HFA) 108 (90 Base) MCG/ACT inhaler, Inhale 2 puffs into the lungs every 6 (six) hours as needed for wheezing or shortness of breath., Disp: 18 g, Rfl: 2 .  [START ON 09/05/2019] alendronate (FOSAMAX) 70 MG tablet, Take 1 tablet (70 mg total) by mouth every Thursday., Disp: 12 tablet, Rfl: 3 .  aspirin EC 81 MG tablet, Take 81 mg by mouth daily., Disp: , Rfl:  .  budesonide-formoterol (SYMBICORT) 80-4.5 MCG/ACT inhaler, Inhale 2 puffs into the lungs 2 (two) times daily., Disp: 3 Inhaler, Rfl: 4 .  cinacalcet (SENSIPAR) 30 MG tablet, Take 1 tablet (30 mg total) by mouth every Monday, Wednesday, and Friday., Disp: 60 tablet, Rfl: 0 .  citalopram (CELEXA) 40 MG tablet, TAKE 1 TABLET BY MOUTH EVERY DAY, Disp: 90 tablet, Rfl: 0 .  docusate calcium (SURFAK) 240 MG capsule, Take 1 capsule (240 mg total) by mouth daily., Disp: 90 capsule, Rfl: 1 .  doxercalciferol (HECTOROL) 4 MCG/2ML injection, Inject 0.5 mLs (1 mcg total) into the vein every Monday, Wednesday, and Friday with hemodialysis., Disp: 2 mL, Rfl: 0 .  ezetimibe (ZETIA) 10 MG tablet, Take 1 tablet (10 mg total) by  mouth daily., Disp: 90 tablet, Rfl: 3 .  famotidine (PEPCID) 20 MG tablet, TAKE 1 TABLET BY MOUTH TWICE A DAY, Disp: 180 tablet, Rfl: 1 .  HYDROcodone-acetaminophen (NORCO/VICODIN) 5-325 MG tablet, Take 1 tablet by mouth every 6 (six) hours as needed for moderate pain., Disp: 30 tablet, Rfl: 0 .  HYDROcodone-acetaminophen (NORCO/VICODIN) 5-325 MG tablet, Take 1 tablet by mouth every 6 (six) hours as needed for moderate pain., Disp: 30 tablet, Rfl: 0 .  HYDROcodone-acetaminophen (NORCO/VICODIN) 5-325 MG tablet, Take 1 tablet by mouth every 6 (six) hours as needed for moderate pain., Disp: 30 tablet, Rfl: 0 .  levothyroxine (SYNTHROID) 100 MCG tablet, Take 1 tablet (100 mcg total) by mouth daily., Disp: 90 tablet, Rfl: 3 .  Methoxy PEG-Epoetin Beta (MIRCERA IJ), Mircera, Disp: , Rfl:  .  Multiple Vitamin (MULTIVITAMIN) tablet, Take 1 tablet by mouth daily., Disp: , Rfl:  .  multivitamin (RENA-VIT) TABS tablet, Take 1 tablet by mouth daily., Disp: , Rfl:  .  nystatin cream (MYCOSTATIN), Apply 1 application topically 2 (two) times daily as needed  for dry skin., Disp: 30 g, Rfl: 0 .  OXYGEN, Inhale 3 L into the lungs continuous., Disp: , Rfl:  .  pantoprazole (PROTONIX) 20 MG tablet, TAKE 1 TABLET BY MOUTH EVERY DAY, Disp: 90 tablet, Rfl: 1 .  pramipexole (MIRAPEX) 0.125 MG tablet, TAKE 1 TABLET (0.125 MG TOTAL) BY MOUTH AT BEDTIME., Disp: 90 tablet, Rfl: 0 .  Vitamin D, Ergocalciferol, (DRISDOL) 1.25 MG (50000 UT) CAPS capsule, Take 1 capsule (50,000 Units total) by mouth every Monday., Disp: 12 capsule, Rfl: 3   Allergies  Allergen Reactions  . Amoxicillin Rash    Has patient had a PCN reaction causing immediate rash, facial/tongue/throat swelling, SOB or lightheadedness with hypotension:YES Has patient had a PCN reaction causing severe rash involving mucus membranes or skin necrosis: Yes Has patient had a PCN reaction that required hospitalization No Has patient had a PCN reaction occurring within  the last 10 years: Yes If all of the above answers are "NO", then may proceed with Cephalosporin use.   . Sulfa Drugs Cross Reactors Hives and Itching  . Percocet [Oxycodone-Acetaminophen] Itching and Rash    Did not happen last time she took it  . Sulfa Antibiotics Hives   Past Medical History:  Diagnosis Date  . Acute respiratory failure ([place]) 05/2017  . Anemia   . Anxiety   . Arthritis   . COPD (chronic obstructive pulmonary disease) ([place])   . Depression   . ESRD (end stage renal disease) ([place])    dialysis MWF  . GERD (gastroesophageal reflux disease)   . Gout   . History of blood transfusion   . History of diabetes mellitus    "diet controlled"  . HLD (hyperlipidemia)   . HOH (hard of hearing)    left ear  . Hypertension    hypotensive -since starting dialysis  . Hypothyroidism   . MRSA (methicillin resistant staph aureus) culture positive 06/01/2017  . PAF (paroxysmal atrial fibrillation) ([place])    a. Echo 11/16:  Mild LVH, EF 55-60%, normal wall motion, MAC, mild MR, severe LAE (49 ml/m2), mild RVE, normal RVSF, mild RAE, mild TR, PASP 24 mmHg;  CHADS2-VASc: 4 >> Coumadin followed by PCP    Past Surgical History:  Procedure Laterality Date  . AV FISTULA PLACEMENT  04/03/2012   Procedure: ARTERIOVENOUS (AV) FISTULA CREATION;  Surgeon: Elam Dutch, MD;  Location: [place];  Service: Vascular;  Laterality: Left;  creation left brachial cephalic fistula   . BIOPSY  08/21/2018   Procedure: BIOPSY;  Surgeon: Thornton Park, MD;  Location: [place];  Service: Gastroenterology;;  . CARPAL TUNNEL RELEASE Bilateral   . COLONOSCOPY W/ POLYPECTOMY    . COLONOSCOPY WITH PROPOFOL N/A 08/21/2018   Procedure: COLONOSCOPY WITH PROPOFOL;  Surgeon: Thornton Park, MD;  Location: [place];  Service: Gastroenterology;  Laterality: N/A;  . DILATION AND CURETTAGE OF UTERUS    . ESOPHAGOGASTRODUODENOSCOPY (EGD) WITH PROPOFOL N/A 08/21/2018   Procedure: ESOPHAGOGASTRODUODENOSCOPY  (EGD) WITH PROPOFOL;  Surgeon: Thornton Park, MD;  Location: [place];  Service: Gastroenterology;  Laterality: N/A;  . EYE SURGERY Bilateral    bilateral cataract removal  . HEMATOMA EVACUATION Left 05/14/2018   Procedure: Incision and Drainage of Left Arm Hematoma;  Surgeon: Elam Dutch, MD;  Location: [facility] OR;  Service: Vascular;  Laterality: Left;  . Hemodialysis  catheter Right   . IR FLUORO GUIDE CV LINE RIGHT  05/26/2017  . IR REMOVAL TUN CV CATH W/O FL  05/24/2017  . IR US  GUIDE VASC ACCESS RIGHT  05/26/2017  . LIGATION OF ARTERIOVENOUS  FISTULA Left 02/04/2015   Procedure: LIGATION OF BRACHIOCEPHALIC ARTERIOVENOUS  FISTULA;  Surgeon: Conrad Nottoway, MD;  Location: [place];  Service: Vascular;  Laterality: Left;  Marland Kitchen MULTIPLE TOOTH EXTRACTIONS    . POLYPECTOMY  08/21/2018   Procedure: POLYPECTOMY;  Surgeon: Thornton Park, MD;  Location: [place];  Service: Gastroenterology;;  . PORTACATH PLACEMENT    . REVERSE SHOULDER ARTHROPLASTY Left 01/22/2016   Procedure: LEFT REVERSE SHOULDER ARTHROPLASTY;  Surgeon: Netta Cedars, MD;  Location: [place];  Service: Orthopedics;  Laterality: Left;  . SHOULDER ARTHROSCOPY Bilateral   . SPLIT NIGHT STUDY  07/26/2015  . STERIOD INJECTION Right 01/22/2016   Procedure: RIGHT RING FINGER STEROID INJECTION;  Surgeon: Netta Cedars, MD;  Location: [place];  Service: Orthopedics;  Laterality: Right;  . TEE WITHOUT CARDIOVERSION N/A 05/26/2017   Procedure: TRANSESOPHAGEAL ECHOCARDIOGRAM (TEE);  Surgeon: Lelon Perla, MD;  Location: [place];  Service: Cardiovascular;  Laterality: N/A;  . THROMBECTOMY BRACHIAL ARTERY Left 02/06/2015   Procedure: EVACUATION OF LEFT ARM HEMATOMA;  Surgeon: Angelia Mould, MD;  Location: [place];  Service: Vascular;  Laterality: Left;  . TOTAL KNEE ARTHROPLASTY Bilateral   . TUBAL LIGATION      Social History   Socioeconomic History  . Marital status: Widowed    Spouse name: Not on file  . Number of  children: 6  . Years of education: 67  . Highest education level: 10th grade  Occupational History  . Occupation: RETIRED  Tobacco Use  . Smoking status: Former Smoker    Packs/day: 0.25    Years: 2.00    Pack years: 0.50    Types: Cigarettes    Quit date: 04/04/1998    Years since quitting: 21.4  . Smokeless tobacco: Never Used  Substance and Sexual Activity  . Alcohol use: No    Alcohol/week: 0.0 standard drinks  . Drug use: No  . Sexual activity: Not Currently    Birth control/protection: None  Other Topics Concern  . Not on file  Social History Narrative   ** Merged History Encounter **       Widowed 6 children Lives with son and daughter in law homemaker   Social Determinants of Health   Financial Resource Strain:   . Difficulty of Paying Living Expenses:   Food Insecurity:   . Worried About Charity fundraiser in the Last Year:   . Arboriculturist in the Last Year:   Transportation Needs:   . Film/video editor (Medical):   Marland Kitchen Lack of Transportation (Non-Medical):   Physical Activity:   . Days of Exercise per Week:   . Minutes of Exercise per Session:   Stress:   . Feeling of Stress :   Social Connections:   . Frequency of Communication with Friends and Family:   . Frequency of Social Gatherings with Friends and Family:   . Attends Religious Services:   . Active Member of Clubs or Organizations:   . Attends Archivist Meetings:   Marland Kitchen Marital Status:   Intimate Partner Violence:   . Fear of Current or Ex-Partner:   . Emotionally Abused:   Marland Kitchen Physically Abused:   . Sexually Abused:         Objective:    BP (!) 148/83   Pulse 65   Temp 97.8 F (36.6 C) (Temporal)   Ht _0  (1.651 m)   Wt  158 lb 6.4 oz (71.8 kg)   SpO2 96%   BMI 26.36 kg/m   Wt Readings from Last 3 Encounters:  09/04/19 158 lb 6.4 oz (71.8 kg)  08/07/19 157 lb (71.2 kg)  06/04/19 162 lb (73.5 kg)    Physical Exam Vitals reviewed.  Constitutional:       General: She is not in acute distress.    Appearance: Normal appearance. She is overweight. She is not ill-appearing, toxic-appearing or diaphoretic.  HENT:     Head: Normocephalic and atraumatic.  Eyes:     General: No scleral icterus.       Right eye: No discharge.        Left eye: No discharge.     Conjunctiva/sclera: Conjunctivae normal.  Cardiovascular:     Rate and Rhythm: Normal rate and regular rhythm.     Heart sounds: Normal heart sounds. No murmur. No friction rub. No gallop.   Pulmonary:     Effort: Pulmonary effort is normal. No respiratory distress.     Breath sounds: Normal breath sounds. No stridor. No wheezing, rhonchi or rales.  Musculoskeletal:        General: Normal range of motion.     Cervical back: Normal range of motion.  Skin:    General: Skin is warm and dry.     Capillary Refill: Capillary refill takes less than 2 seconds.     Findings: Bruising (scattered) present.  Neurological:     General: No focal deficit present.     Mental Status: She is alert and oriented to person, place, and time. Mental status is at baseline.     Gait: Gait abnormal (ambulating with rolling walker).  Psychiatric:        Mood and Affect: Mood normal.        Behavior: Behavior normal.        Thought Content: Thought content normal.        Judgment: Judgment normal.     Lab Results  Component Value Date   TSH 2.020 02/04/2019   Lab Results  Component Value Date   WBC 7.4 02/04/2019   HGB 12.1 02/04/2019   HCT 37.3 02/04/2019   MCV 94 02/04/2019   PLT 210 02/04/2019   Lab Results  Component Value Date   NA 141 02/04/2019   K 4.3 02/04/2019   CO2 23 02/04/2019   GLUCOSE 88 02/04/2019   BUN 10 02/04/2019   CREATININE 3.66 (H) 02/04/2019   BILITOT 0.6 09/06/2018   ALKPHOS 140 (H) 09/06/2018   AST 21 09/06/2018   ALT 15 09/06/2018   PROT 6.0 (L) 09/06/2018   ALBUMIN 3.5 09/06/2018   CALCIUM 10.2 02/04/2019   ANIONGAP 9 09/06/2018   Lab Results  Component  Value Date   CHOL 107 05/25/2017   Lab Results  Component Value Date   HDL 32 (L) 05/25/2017   Lab Results  Component Value Date   LDLCALC 57 05/25/2017   Lab Results  Component Value Date   TRIG 88 05/25/2017   Lab Results  Component Value Date   CHOLHDL 3.3 05/25/2017   Lab Results  Component Value Date   HGBA1C 4.5 (L) 05/25/2017

## 2019-09-05 DIAGNOSIS — N186 End stage renal disease: Secondary | ICD-10-CM | POA: Diagnosis not present

## 2019-09-05 DIAGNOSIS — E1151 Type 2 diabetes mellitus with diabetic peripheral angiopathy without gangrene: Secondary | ICD-10-CM | POA: Diagnosis not present

## 2019-09-05 DIAGNOSIS — I480 Paroxysmal atrial fibrillation: Secondary | ICD-10-CM | POA: Diagnosis not present

## 2019-09-05 DIAGNOSIS — H9192 Unspecified hearing loss, left ear: Secondary | ICD-10-CM | POA: Diagnosis not present

## 2019-09-05 DIAGNOSIS — I120 Hypertensive chronic kidney disease with stage 5 chronic kidney disease or end stage renal disease: Secondary | ICD-10-CM | POA: Diagnosis not present

## 2019-09-05 DIAGNOSIS — E039 Hypothyroidism, unspecified: Secondary | ICD-10-CM | POA: Diagnosis not present

## 2019-09-05 DIAGNOSIS — E1122 Type 2 diabetes mellitus with diabetic chronic kidney disease: Secondary | ICD-10-CM | POA: Diagnosis not present

## 2019-09-05 DIAGNOSIS — E785 Hyperlipidemia, unspecified: Secondary | ICD-10-CM | POA: Diagnosis not present

## 2019-09-05 DIAGNOSIS — D631 Anemia in chronic kidney disease: Secondary | ICD-10-CM | POA: Diagnosis not present

## 2019-09-05 DIAGNOSIS — M1030 Gout due to renal impairment, unspecified site: Secondary | ICD-10-CM | POA: Diagnosis not present

## 2019-09-05 DIAGNOSIS — J449 Chronic obstructive pulmonary disease, unspecified: Secondary | ICD-10-CM | POA: Diagnosis not present

## 2019-09-05 DIAGNOSIS — I872 Venous insufficiency (chronic) (peripheral): Secondary | ICD-10-CM | POA: Diagnosis not present

## 2019-09-05 DIAGNOSIS — M1990 Unspecified osteoarthritis, unspecified site: Secondary | ICD-10-CM | POA: Diagnosis not present

## 2019-09-05 NOTE — Progress Notes (Signed)
They have put a stopped to that now with the new medical director. If it is not dialysis related they will not do it.

## 2019-09-05 NOTE — Progress Notes (Signed)
Please make patient aware of this so that she can come back for lab work.

## 2019-09-05 NOTE — Progress Notes (Signed)
Patient aware and verbalized understanding. °

## 2019-09-06 ENCOUNTER — Other Ambulatory Visit: Payer: Medicare Other

## 2019-09-06 ENCOUNTER — Other Ambulatory Visit: Payer: Self-pay

## 2019-09-06 DIAGNOSIS — N2581 Secondary hyperparathyroidism of renal origin: Secondary | ICD-10-CM | POA: Diagnosis not present

## 2019-09-06 DIAGNOSIS — Z4901 Encounter for fitting and adjustment of extracorporeal dialysis catheter: Secondary | ICD-10-CM | POA: Diagnosis not present

## 2019-09-06 DIAGNOSIS — D509 Iron deficiency anemia, unspecified: Secondary | ICD-10-CM | POA: Diagnosis not present

## 2019-09-06 DIAGNOSIS — D689 Coagulation defect, unspecified: Secondary | ICD-10-CM | POA: Diagnosis not present

## 2019-09-06 DIAGNOSIS — E039 Hypothyroidism, unspecified: Secondary | ICD-10-CM | POA: Diagnosis not present

## 2019-09-06 DIAGNOSIS — I10 Essential (primary) hypertension: Secondary | ICD-10-CM | POA: Diagnosis not present

## 2019-09-06 DIAGNOSIS — Z992 Dependence on renal dialysis: Secondary | ICD-10-CM | POA: Diagnosis not present

## 2019-09-06 DIAGNOSIS — D631 Anemia in chronic kidney disease: Secondary | ICD-10-CM | POA: Diagnosis not present

## 2019-09-06 DIAGNOSIS — N186 End stage renal disease: Secondary | ICD-10-CM | POA: Diagnosis not present

## 2019-09-06 NOTE — Addendum Note (Signed)
Addended by: Nigel Berthold C on: 09/06/2019 11:04 AM   Modules accepted: Orders

## 2019-09-07 LAB — LIPID PANEL
Chol/HDL Ratio: 2.3 ratio (ref 0.0–4.4)
Cholesterol, Total: 182 mg/dL (ref 100–199)
HDL: 78 mg/dL (ref >39)
LDL Chol Calc (NIH): 91 mg/dL (ref 0–99)
Triglycerides: 69 mg/dL (ref 0–149)
VLDL Cholesterol Cal: 13 mg/dL (ref 5–40)

## 2019-09-07 LAB — TSH: TSH: 3.59 u[IU]/mL (ref 0.450–4.500)

## 2019-09-09 DIAGNOSIS — D689 Coagulation defect, unspecified: Secondary | ICD-10-CM | POA: Diagnosis not present

## 2019-09-09 DIAGNOSIS — D509 Iron deficiency anemia, unspecified: Secondary | ICD-10-CM | POA: Diagnosis not present

## 2019-09-09 DIAGNOSIS — N2581 Secondary hyperparathyroidism of renal origin: Secondary | ICD-10-CM | POA: Diagnosis not present

## 2019-09-09 DIAGNOSIS — Z4901 Encounter for fitting and adjustment of extracorporeal dialysis catheter: Secondary | ICD-10-CM | POA: Diagnosis not present

## 2019-09-09 DIAGNOSIS — N186 End stage renal disease: Secondary | ICD-10-CM | POA: Diagnosis not present

## 2019-09-09 DIAGNOSIS — Z992 Dependence on renal dialysis: Secondary | ICD-10-CM | POA: Diagnosis not present

## 2019-09-09 DIAGNOSIS — D631 Anemia in chronic kidney disease: Secondary | ICD-10-CM | POA: Diagnosis not present

## 2019-09-11 DIAGNOSIS — N2581 Secondary hyperparathyroidism of renal origin: Secondary | ICD-10-CM | POA: Diagnosis not present

## 2019-09-11 DIAGNOSIS — D689 Coagulation defect, unspecified: Secondary | ICD-10-CM | POA: Diagnosis not present

## 2019-09-11 DIAGNOSIS — D509 Iron deficiency anemia, unspecified: Secondary | ICD-10-CM | POA: Diagnosis not present

## 2019-09-11 DIAGNOSIS — Z992 Dependence on renal dialysis: Secondary | ICD-10-CM | POA: Diagnosis not present

## 2019-09-11 DIAGNOSIS — D631 Anemia in chronic kidney disease: Secondary | ICD-10-CM | POA: Diagnosis not present

## 2019-09-11 DIAGNOSIS — N186 End stage renal disease: Secondary | ICD-10-CM | POA: Diagnosis not present

## 2019-09-11 DIAGNOSIS — Z4901 Encounter for fitting and adjustment of extracorporeal dialysis catheter: Secondary | ICD-10-CM | POA: Diagnosis not present

## 2019-09-13 DIAGNOSIS — Z4901 Encounter for fitting and adjustment of extracorporeal dialysis catheter: Secondary | ICD-10-CM | POA: Diagnosis not present

## 2019-09-13 DIAGNOSIS — Z992 Dependence on renal dialysis: Secondary | ICD-10-CM | POA: Diagnosis not present

## 2019-09-13 DIAGNOSIS — D689 Coagulation defect, unspecified: Secondary | ICD-10-CM | POA: Diagnosis not present

## 2019-09-13 DIAGNOSIS — D631 Anemia in chronic kidney disease: Secondary | ICD-10-CM | POA: Diagnosis not present

## 2019-09-13 DIAGNOSIS — N2581 Secondary hyperparathyroidism of renal origin: Secondary | ICD-10-CM | POA: Diagnosis not present

## 2019-09-13 DIAGNOSIS — N186 End stage renal disease: Secondary | ICD-10-CM | POA: Diagnosis not present

## 2019-09-13 DIAGNOSIS — D509 Iron deficiency anemia, unspecified: Secondary | ICD-10-CM | POA: Diagnosis not present

## 2019-09-17 DIAGNOSIS — D509 Iron deficiency anemia, unspecified: Secondary | ICD-10-CM | POA: Diagnosis not present

## 2019-09-17 DIAGNOSIS — D689 Coagulation defect, unspecified: Secondary | ICD-10-CM | POA: Diagnosis not present

## 2019-09-17 DIAGNOSIS — Z992 Dependence on renal dialysis: Secondary | ICD-10-CM | POA: Diagnosis not present

## 2019-09-17 DIAGNOSIS — N2581 Secondary hyperparathyroidism of renal origin: Secondary | ICD-10-CM | POA: Diagnosis not present

## 2019-09-17 DIAGNOSIS — N186 End stage renal disease: Secondary | ICD-10-CM | POA: Diagnosis not present

## 2019-09-17 DIAGNOSIS — D631 Anemia in chronic kidney disease: Secondary | ICD-10-CM | POA: Diagnosis not present

## 2019-09-17 DIAGNOSIS — Z4901 Encounter for fitting and adjustment of extracorporeal dialysis catheter: Secondary | ICD-10-CM | POA: Diagnosis not present

## 2019-09-18 DIAGNOSIS — Z992 Dependence on renal dialysis: Secondary | ICD-10-CM | POA: Diagnosis not present

## 2019-09-18 DIAGNOSIS — N2581 Secondary hyperparathyroidism of renal origin: Secondary | ICD-10-CM | POA: Diagnosis not present

## 2019-09-18 DIAGNOSIS — D509 Iron deficiency anemia, unspecified: Secondary | ICD-10-CM | POA: Diagnosis not present

## 2019-09-18 DIAGNOSIS — Z4901 Encounter for fitting and adjustment of extracorporeal dialysis catheter: Secondary | ICD-10-CM | POA: Diagnosis not present

## 2019-09-18 DIAGNOSIS — N186 End stage renal disease: Secondary | ICD-10-CM | POA: Diagnosis not present

## 2019-09-18 DIAGNOSIS — D689 Coagulation defect, unspecified: Secondary | ICD-10-CM | POA: Diagnosis not present

## 2019-09-18 DIAGNOSIS — D631 Anemia in chronic kidney disease: Secondary | ICD-10-CM | POA: Diagnosis not present

## 2019-09-20 DIAGNOSIS — D689 Coagulation defect, unspecified: Secondary | ICD-10-CM | POA: Diagnosis not present

## 2019-09-20 DIAGNOSIS — D509 Iron deficiency anemia, unspecified: Secondary | ICD-10-CM | POA: Diagnosis not present

## 2019-09-20 DIAGNOSIS — Z4901 Encounter for fitting and adjustment of extracorporeal dialysis catheter: Secondary | ICD-10-CM | POA: Diagnosis not present

## 2019-09-20 DIAGNOSIS — D631 Anemia in chronic kidney disease: Secondary | ICD-10-CM | POA: Diagnosis not present

## 2019-09-20 DIAGNOSIS — Z992 Dependence on renal dialysis: Secondary | ICD-10-CM | POA: Diagnosis not present

## 2019-09-20 DIAGNOSIS — N186 End stage renal disease: Secondary | ICD-10-CM | POA: Diagnosis not present

## 2019-09-20 DIAGNOSIS — N2581 Secondary hyperparathyroidism of renal origin: Secondary | ICD-10-CM | POA: Diagnosis not present

## 2019-09-23 DIAGNOSIS — D509 Iron deficiency anemia, unspecified: Secondary | ICD-10-CM | POA: Diagnosis not present

## 2019-09-23 DIAGNOSIS — Z992 Dependence on renal dialysis: Secondary | ICD-10-CM | POA: Diagnosis not present

## 2019-09-23 DIAGNOSIS — N186 End stage renal disease: Secondary | ICD-10-CM | POA: Diagnosis not present

## 2019-09-23 DIAGNOSIS — Z4901 Encounter for fitting and adjustment of extracorporeal dialysis catheter: Secondary | ICD-10-CM | POA: Diagnosis not present

## 2019-09-23 DIAGNOSIS — D631 Anemia in chronic kidney disease: Secondary | ICD-10-CM | POA: Diagnosis not present

## 2019-09-23 DIAGNOSIS — D689 Coagulation defect, unspecified: Secondary | ICD-10-CM | POA: Diagnosis not present

## 2019-09-23 DIAGNOSIS — N2581 Secondary hyperparathyroidism of renal origin: Secondary | ICD-10-CM | POA: Diagnosis not present

## 2019-09-24 DIAGNOSIS — N2581 Secondary hyperparathyroidism of renal origin: Secondary | ICD-10-CM | POA: Diagnosis not present

## 2019-09-24 DIAGNOSIS — Z992 Dependence on renal dialysis: Secondary | ICD-10-CM | POA: Diagnosis not present

## 2019-09-24 DIAGNOSIS — D509 Iron deficiency anemia, unspecified: Secondary | ICD-10-CM | POA: Diagnosis not present

## 2019-09-24 DIAGNOSIS — D631 Anemia in chronic kidney disease: Secondary | ICD-10-CM | POA: Diagnosis not present

## 2019-09-24 DIAGNOSIS — N186 End stage renal disease: Secondary | ICD-10-CM | POA: Diagnosis not present

## 2019-09-24 DIAGNOSIS — Z4901 Encounter for fitting and adjustment of extracorporeal dialysis catheter: Secondary | ICD-10-CM | POA: Diagnosis not present

## 2019-09-24 DIAGNOSIS — D689 Coagulation defect, unspecified: Secondary | ICD-10-CM | POA: Diagnosis not present

## 2019-09-25 DIAGNOSIS — Z4901 Encounter for fitting and adjustment of extracorporeal dialysis catheter: Secondary | ICD-10-CM | POA: Diagnosis not present

## 2019-09-25 DIAGNOSIS — D631 Anemia in chronic kidney disease: Secondary | ICD-10-CM | POA: Diagnosis not present

## 2019-09-25 DIAGNOSIS — D689 Coagulation defect, unspecified: Secondary | ICD-10-CM | POA: Diagnosis not present

## 2019-09-25 DIAGNOSIS — N2581 Secondary hyperparathyroidism of renal origin: Secondary | ICD-10-CM | POA: Diagnosis not present

## 2019-09-25 DIAGNOSIS — D509 Iron deficiency anemia, unspecified: Secondary | ICD-10-CM | POA: Diagnosis not present

## 2019-09-25 DIAGNOSIS — N186 End stage renal disease: Secondary | ICD-10-CM | POA: Diagnosis not present

## 2019-09-25 DIAGNOSIS — Z992 Dependence on renal dialysis: Secondary | ICD-10-CM | POA: Diagnosis not present

## 2019-09-27 DIAGNOSIS — D631 Anemia in chronic kidney disease: Secondary | ICD-10-CM | POA: Diagnosis not present

## 2019-09-27 DIAGNOSIS — Z992 Dependence on renal dialysis: Secondary | ICD-10-CM | POA: Diagnosis not present

## 2019-09-27 DIAGNOSIS — D689 Coagulation defect, unspecified: Secondary | ICD-10-CM | POA: Diagnosis not present

## 2019-09-27 DIAGNOSIS — D509 Iron deficiency anemia, unspecified: Secondary | ICD-10-CM | POA: Diagnosis not present

## 2019-09-27 DIAGNOSIS — N186 End stage renal disease: Secondary | ICD-10-CM | POA: Diagnosis not present

## 2019-09-27 DIAGNOSIS — N2581 Secondary hyperparathyroidism of renal origin: Secondary | ICD-10-CM | POA: Diagnosis not present

## 2019-09-27 DIAGNOSIS — Z4901 Encounter for fitting and adjustment of extracorporeal dialysis catheter: Secondary | ICD-10-CM | POA: Diagnosis not present

## 2019-09-28 DIAGNOSIS — I480 Paroxysmal atrial fibrillation: Secondary | ICD-10-CM | POA: Diagnosis not present

## 2019-09-30 DIAGNOSIS — Z4901 Encounter for fitting and adjustment of extracorporeal dialysis catheter: Secondary | ICD-10-CM | POA: Diagnosis not present

## 2019-09-30 DIAGNOSIS — N186 End stage renal disease: Secondary | ICD-10-CM | POA: Diagnosis not present

## 2019-09-30 DIAGNOSIS — D509 Iron deficiency anemia, unspecified: Secondary | ICD-10-CM | POA: Diagnosis not present

## 2019-09-30 DIAGNOSIS — N2581 Secondary hyperparathyroidism of renal origin: Secondary | ICD-10-CM | POA: Diagnosis not present

## 2019-09-30 DIAGNOSIS — D631 Anemia in chronic kidney disease: Secondary | ICD-10-CM | POA: Diagnosis not present

## 2019-09-30 DIAGNOSIS — D689 Coagulation defect, unspecified: Secondary | ICD-10-CM | POA: Diagnosis not present

## 2019-09-30 DIAGNOSIS — Z992 Dependence on renal dialysis: Secondary | ICD-10-CM | POA: Diagnosis not present

## 2019-10-02 DIAGNOSIS — E1122 Type 2 diabetes mellitus with diabetic chronic kidney disease: Secondary | ICD-10-CM | POA: Diagnosis not present

## 2019-10-02 DIAGNOSIS — Z992 Dependence on renal dialysis: Secondary | ICD-10-CM | POA: Diagnosis not present

## 2019-10-02 DIAGNOSIS — Z4901 Encounter for fitting and adjustment of extracorporeal dialysis catheter: Secondary | ICD-10-CM | POA: Diagnosis not present

## 2019-10-02 DIAGNOSIS — N186 End stage renal disease: Secondary | ICD-10-CM | POA: Diagnosis not present

## 2019-10-02 DIAGNOSIS — D631 Anemia in chronic kidney disease: Secondary | ICD-10-CM | POA: Diagnosis not present

## 2019-10-02 DIAGNOSIS — D689 Coagulation defect, unspecified: Secondary | ICD-10-CM | POA: Diagnosis not present

## 2019-10-02 DIAGNOSIS — N2581 Secondary hyperparathyroidism of renal origin: Secondary | ICD-10-CM | POA: Diagnosis not present

## 2019-10-02 DIAGNOSIS — D509 Iron deficiency anemia, unspecified: Secondary | ICD-10-CM | POA: Diagnosis not present

## 2019-10-04 DIAGNOSIS — Z992 Dependence on renal dialysis: Secondary | ICD-10-CM | POA: Diagnosis not present

## 2019-10-04 DIAGNOSIS — N185 Chronic kidney disease, stage 5: Secondary | ICD-10-CM | POA: Diagnosis not present

## 2019-10-04 DIAGNOSIS — Z4901 Encounter for fitting and adjustment of extracorporeal dialysis catheter: Secondary | ICD-10-CM | POA: Diagnosis not present

## 2019-10-04 DIAGNOSIS — N2581 Secondary hyperparathyroidism of renal origin: Secondary | ICD-10-CM | POA: Diagnosis not present

## 2019-10-04 DIAGNOSIS — D689 Coagulation defect, unspecified: Secondary | ICD-10-CM | POA: Diagnosis not present

## 2019-10-04 DIAGNOSIS — D509 Iron deficiency anemia, unspecified: Secondary | ICD-10-CM | POA: Diagnosis not present

## 2019-10-04 DIAGNOSIS — E1121 Type 2 diabetes mellitus with diabetic nephropathy: Secondary | ICD-10-CM | POA: Diagnosis not present

## 2019-10-04 DIAGNOSIS — D631 Anemia in chronic kidney disease: Secondary | ICD-10-CM | POA: Diagnosis not present

## 2019-10-04 DIAGNOSIS — N186 End stage renal disease: Secondary | ICD-10-CM | POA: Diagnosis not present

## 2019-10-07 DIAGNOSIS — D631 Anemia in chronic kidney disease: Secondary | ICD-10-CM | POA: Diagnosis not present

## 2019-10-07 DIAGNOSIS — E1121 Type 2 diabetes mellitus with diabetic nephropathy: Secondary | ICD-10-CM | POA: Diagnosis not present

## 2019-10-07 DIAGNOSIS — D509 Iron deficiency anemia, unspecified: Secondary | ICD-10-CM | POA: Diagnosis not present

## 2019-10-07 DIAGNOSIS — N186 End stage renal disease: Secondary | ICD-10-CM | POA: Diagnosis not present

## 2019-10-07 DIAGNOSIS — D689 Coagulation defect, unspecified: Secondary | ICD-10-CM | POA: Diagnosis not present

## 2019-10-07 DIAGNOSIS — Z992 Dependence on renal dialysis: Secondary | ICD-10-CM | POA: Diagnosis not present

## 2019-10-07 DIAGNOSIS — N185 Chronic kidney disease, stage 5: Secondary | ICD-10-CM | POA: Diagnosis not present

## 2019-10-07 DIAGNOSIS — Z4901 Encounter for fitting and adjustment of extracorporeal dialysis catheter: Secondary | ICD-10-CM | POA: Diagnosis not present

## 2019-10-07 DIAGNOSIS — N2581 Secondary hyperparathyroidism of renal origin: Secondary | ICD-10-CM | POA: Diagnosis not present

## 2019-10-08 DIAGNOSIS — Z992 Dependence on renal dialysis: Secondary | ICD-10-CM | POA: Diagnosis not present

## 2019-10-08 DIAGNOSIS — T82898A Other specified complication of vascular prosthetic devices, implants and grafts, initial encounter: Secondary | ICD-10-CM | POA: Diagnosis not present

## 2019-10-08 DIAGNOSIS — N186 End stage renal disease: Secondary | ICD-10-CM | POA: Diagnosis not present

## 2019-10-09 DIAGNOSIS — E1121 Type 2 diabetes mellitus with diabetic nephropathy: Secondary | ICD-10-CM | POA: Diagnosis not present

## 2019-10-09 DIAGNOSIS — Z4901 Encounter for fitting and adjustment of extracorporeal dialysis catheter: Secondary | ICD-10-CM | POA: Diagnosis not present

## 2019-10-09 DIAGNOSIS — I10 Essential (primary) hypertension: Secondary | ICD-10-CM | POA: Diagnosis not present

## 2019-10-09 DIAGNOSIS — D689 Coagulation defect, unspecified: Secondary | ICD-10-CM | POA: Diagnosis not present

## 2019-10-09 DIAGNOSIS — N186 End stage renal disease: Secondary | ICD-10-CM | POA: Diagnosis not present

## 2019-10-09 DIAGNOSIS — D509 Iron deficiency anemia, unspecified: Secondary | ICD-10-CM | POA: Diagnosis not present

## 2019-10-09 DIAGNOSIS — N2581 Secondary hyperparathyroidism of renal origin: Secondary | ICD-10-CM | POA: Diagnosis not present

## 2019-10-09 DIAGNOSIS — D631 Anemia in chronic kidney disease: Secondary | ICD-10-CM | POA: Diagnosis not present

## 2019-10-09 DIAGNOSIS — Z992 Dependence on renal dialysis: Secondary | ICD-10-CM | POA: Diagnosis not present

## 2019-10-09 DIAGNOSIS — N185 Chronic kidney disease, stage 5: Secondary | ICD-10-CM | POA: Diagnosis not present

## 2019-10-11 DIAGNOSIS — N186 End stage renal disease: Secondary | ICD-10-CM | POA: Diagnosis not present

## 2019-10-11 DIAGNOSIS — Z992 Dependence on renal dialysis: Secondary | ICD-10-CM | POA: Diagnosis not present

## 2019-10-11 DIAGNOSIS — N2581 Secondary hyperparathyroidism of renal origin: Secondary | ICD-10-CM | POA: Diagnosis not present

## 2019-10-11 DIAGNOSIS — N185 Chronic kidney disease, stage 5: Secondary | ICD-10-CM | POA: Diagnosis not present

## 2019-10-11 DIAGNOSIS — D509 Iron deficiency anemia, unspecified: Secondary | ICD-10-CM | POA: Diagnosis not present

## 2019-10-11 DIAGNOSIS — E1121 Type 2 diabetes mellitus with diabetic nephropathy: Secondary | ICD-10-CM | POA: Diagnosis not present

## 2019-10-11 DIAGNOSIS — D689 Coagulation defect, unspecified: Secondary | ICD-10-CM | POA: Diagnosis not present

## 2019-10-11 DIAGNOSIS — D631 Anemia in chronic kidney disease: Secondary | ICD-10-CM | POA: Diagnosis not present

## 2019-10-11 DIAGNOSIS — Z4901 Encounter for fitting and adjustment of extracorporeal dialysis catheter: Secondary | ICD-10-CM | POA: Diagnosis not present

## 2019-10-14 DIAGNOSIS — D631 Anemia in chronic kidney disease: Secondary | ICD-10-CM | POA: Diagnosis not present

## 2019-10-14 DIAGNOSIS — N2581 Secondary hyperparathyroidism of renal origin: Secondary | ICD-10-CM | POA: Diagnosis not present

## 2019-10-14 DIAGNOSIS — D689 Coagulation defect, unspecified: Secondary | ICD-10-CM | POA: Diagnosis not present

## 2019-10-14 DIAGNOSIS — D509 Iron deficiency anemia, unspecified: Secondary | ICD-10-CM | POA: Diagnosis not present

## 2019-10-14 DIAGNOSIS — Z992 Dependence on renal dialysis: Secondary | ICD-10-CM | POA: Diagnosis not present

## 2019-10-14 DIAGNOSIS — N185 Chronic kidney disease, stage 5: Secondary | ICD-10-CM | POA: Diagnosis not present

## 2019-10-14 DIAGNOSIS — Z4901 Encounter for fitting and adjustment of extracorporeal dialysis catheter: Secondary | ICD-10-CM | POA: Diagnosis not present

## 2019-10-14 DIAGNOSIS — E1121 Type 2 diabetes mellitus with diabetic nephropathy: Secondary | ICD-10-CM | POA: Diagnosis not present

## 2019-10-14 DIAGNOSIS — N186 End stage renal disease: Secondary | ICD-10-CM | POA: Diagnosis not present

## 2019-10-16 DIAGNOSIS — D509 Iron deficiency anemia, unspecified: Secondary | ICD-10-CM | POA: Diagnosis not present

## 2019-10-16 DIAGNOSIS — Z4901 Encounter for fitting and adjustment of extracorporeal dialysis catheter: Secondary | ICD-10-CM | POA: Diagnosis not present

## 2019-10-16 DIAGNOSIS — Z992 Dependence on renal dialysis: Secondary | ICD-10-CM | POA: Diagnosis not present

## 2019-10-16 DIAGNOSIS — N2581 Secondary hyperparathyroidism of renal origin: Secondary | ICD-10-CM | POA: Diagnosis not present

## 2019-10-16 DIAGNOSIS — N185 Chronic kidney disease, stage 5: Secondary | ICD-10-CM | POA: Diagnosis not present

## 2019-10-16 DIAGNOSIS — D631 Anemia in chronic kidney disease: Secondary | ICD-10-CM | POA: Diagnosis not present

## 2019-10-16 DIAGNOSIS — N186 End stage renal disease: Secondary | ICD-10-CM | POA: Diagnosis not present

## 2019-10-16 DIAGNOSIS — E1121 Type 2 diabetes mellitus with diabetic nephropathy: Secondary | ICD-10-CM | POA: Diagnosis not present

## 2019-10-16 DIAGNOSIS — D689 Coagulation defect, unspecified: Secondary | ICD-10-CM | POA: Diagnosis not present

## 2019-10-18 DIAGNOSIS — D509 Iron deficiency anemia, unspecified: Secondary | ICD-10-CM | POA: Diagnosis not present

## 2019-10-18 DIAGNOSIS — Z992 Dependence on renal dialysis: Secondary | ICD-10-CM | POA: Diagnosis not present

## 2019-10-18 DIAGNOSIS — N2581 Secondary hyperparathyroidism of renal origin: Secondary | ICD-10-CM | POA: Diagnosis not present

## 2019-10-18 DIAGNOSIS — D689 Coagulation defect, unspecified: Secondary | ICD-10-CM | POA: Diagnosis not present

## 2019-10-18 DIAGNOSIS — N186 End stage renal disease: Secondary | ICD-10-CM | POA: Diagnosis not present

## 2019-10-18 DIAGNOSIS — E1121 Type 2 diabetes mellitus with diabetic nephropathy: Secondary | ICD-10-CM | POA: Diagnosis not present

## 2019-10-18 DIAGNOSIS — Z4901 Encounter for fitting and adjustment of extracorporeal dialysis catheter: Secondary | ICD-10-CM | POA: Diagnosis not present

## 2019-10-18 DIAGNOSIS — N185 Chronic kidney disease, stage 5: Secondary | ICD-10-CM | POA: Diagnosis not present

## 2019-10-18 DIAGNOSIS — D631 Anemia in chronic kidney disease: Secondary | ICD-10-CM | POA: Diagnosis not present

## 2019-10-21 DIAGNOSIS — D631 Anemia in chronic kidney disease: Secondary | ICD-10-CM | POA: Diagnosis not present

## 2019-10-21 DIAGNOSIS — S92352A Displaced fracture of fifth metatarsal bone, left foot, initial encounter for closed fracture: Secondary | ICD-10-CM | POA: Diagnosis not present

## 2019-10-21 DIAGNOSIS — D689 Coagulation defect, unspecified: Secondary | ICD-10-CM | POA: Diagnosis not present

## 2019-10-21 DIAGNOSIS — Z992 Dependence on renal dialysis: Secondary | ICD-10-CM | POA: Diagnosis not present

## 2019-10-21 DIAGNOSIS — D509 Iron deficiency anemia, unspecified: Secondary | ICD-10-CM | POA: Diagnosis not present

## 2019-10-21 DIAGNOSIS — N186 End stage renal disease: Secondary | ICD-10-CM | POA: Diagnosis not present

## 2019-10-21 DIAGNOSIS — N185 Chronic kidney disease, stage 5: Secondary | ICD-10-CM | POA: Diagnosis not present

## 2019-10-21 DIAGNOSIS — N2581 Secondary hyperparathyroidism of renal origin: Secondary | ICD-10-CM | POA: Diagnosis not present

## 2019-10-21 DIAGNOSIS — S92354A Nondisplaced fracture of fifth metatarsal bone, right foot, initial encounter for closed fracture: Secondary | ICD-10-CM | POA: Diagnosis not present

## 2019-10-21 DIAGNOSIS — M79672 Pain in left foot: Secondary | ICD-10-CM | POA: Diagnosis not present

## 2019-10-21 DIAGNOSIS — E1121 Type 2 diabetes mellitus with diabetic nephropathy: Secondary | ICD-10-CM | POA: Diagnosis not present

## 2019-10-21 DIAGNOSIS — Z4901 Encounter for fitting and adjustment of extracorporeal dialysis catheter: Secondary | ICD-10-CM | POA: Diagnosis not present

## 2019-10-23 DIAGNOSIS — Z4901 Encounter for fitting and adjustment of extracorporeal dialysis catheter: Secondary | ICD-10-CM | POA: Diagnosis not present

## 2019-10-23 DIAGNOSIS — N2581 Secondary hyperparathyroidism of renal origin: Secondary | ICD-10-CM | POA: Diagnosis not present

## 2019-10-23 DIAGNOSIS — N185 Chronic kidney disease, stage 5: Secondary | ICD-10-CM | POA: Diagnosis not present

## 2019-10-23 DIAGNOSIS — N186 End stage renal disease: Secondary | ICD-10-CM | POA: Diagnosis not present

## 2019-10-23 DIAGNOSIS — D689 Coagulation defect, unspecified: Secondary | ICD-10-CM | POA: Diagnosis not present

## 2019-10-23 DIAGNOSIS — Z992 Dependence on renal dialysis: Secondary | ICD-10-CM | POA: Diagnosis not present

## 2019-10-23 DIAGNOSIS — E1121 Type 2 diabetes mellitus with diabetic nephropathy: Secondary | ICD-10-CM | POA: Diagnosis not present

## 2019-10-23 DIAGNOSIS — D631 Anemia in chronic kidney disease: Secondary | ICD-10-CM | POA: Diagnosis not present

## 2019-10-23 DIAGNOSIS — D509 Iron deficiency anemia, unspecified: Secondary | ICD-10-CM | POA: Diagnosis not present

## 2019-10-24 ENCOUNTER — Other Ambulatory Visit: Payer: Self-pay | Admitting: Family Medicine

## 2019-10-24 DIAGNOSIS — G2581 Restless legs syndrome: Secondary | ICD-10-CM

## 2019-10-25 ENCOUNTER — Other Ambulatory Visit: Payer: Self-pay

## 2019-10-25 ENCOUNTER — Ambulatory Visit (INDEPENDENT_AMBULATORY_CARE_PROVIDER_SITE_OTHER): Payer: Medicare Other

## 2019-10-25 DIAGNOSIS — D631 Anemia in chronic kidney disease: Secondary | ICD-10-CM

## 2019-10-25 DIAGNOSIS — E1122 Type 2 diabetes mellitus with diabetic chronic kidney disease: Secondary | ICD-10-CM

## 2019-10-25 DIAGNOSIS — F329 Major depressive disorder, single episode, unspecified: Secondary | ICD-10-CM

## 2019-10-25 DIAGNOSIS — M1030 Gout due to renal impairment, unspecified site: Secondary | ICD-10-CM

## 2019-10-25 DIAGNOSIS — E785 Hyperlipidemia, unspecified: Secondary | ICD-10-CM

## 2019-10-25 DIAGNOSIS — I872 Venous insufficiency (chronic) (peripheral): Secondary | ICD-10-CM

## 2019-10-25 DIAGNOSIS — I120 Hypertensive chronic kidney disease with stage 5 chronic kidney disease or end stage renal disease: Secondary | ICD-10-CM

## 2019-10-25 DIAGNOSIS — N186 End stage renal disease: Secondary | ICD-10-CM

## 2019-10-25 DIAGNOSIS — N2581 Secondary hyperparathyroidism of renal origin: Secondary | ICD-10-CM | POA: Diagnosis not present

## 2019-10-25 DIAGNOSIS — K2100 Gastro-esophageal reflux disease with esophagitis, without bleeding: Secondary | ICD-10-CM

## 2019-10-25 DIAGNOSIS — E1151 Type 2 diabetes mellitus with diabetic peripheral angiopathy without gangrene: Secondary | ICD-10-CM

## 2019-10-25 DIAGNOSIS — D509 Iron deficiency anemia, unspecified: Secondary | ICD-10-CM | POA: Diagnosis not present

## 2019-10-25 DIAGNOSIS — E039 Hypothyroidism, unspecified: Secondary | ICD-10-CM

## 2019-10-25 DIAGNOSIS — D689 Coagulation defect, unspecified: Secondary | ICD-10-CM | POA: Diagnosis not present

## 2019-10-25 DIAGNOSIS — Z992 Dependence on renal dialysis: Secondary | ICD-10-CM | POA: Diagnosis not present

## 2019-10-25 DIAGNOSIS — K5730 Diverticulosis of large intestine without perforation or abscess without bleeding: Secondary | ICD-10-CM

## 2019-10-25 DIAGNOSIS — F419 Anxiety disorder, unspecified: Secondary | ICD-10-CM

## 2019-10-25 DIAGNOSIS — I480 Paroxysmal atrial fibrillation: Secondary | ICD-10-CM

## 2019-10-25 DIAGNOSIS — N185 Chronic kidney disease, stage 5: Secondary | ICD-10-CM | POA: Diagnosis not present

## 2019-10-25 DIAGNOSIS — Z9981 Dependence on supplemental oxygen: Secondary | ICD-10-CM

## 2019-10-25 DIAGNOSIS — M1990 Unspecified osteoarthritis, unspecified site: Secondary | ICD-10-CM

## 2019-10-25 DIAGNOSIS — K222 Esophageal obstruction: Secondary | ICD-10-CM

## 2019-10-25 DIAGNOSIS — Z4901 Encounter for fitting and adjustment of extracorporeal dialysis catheter: Secondary | ICD-10-CM | POA: Diagnosis not present

## 2019-10-25 DIAGNOSIS — E1121 Type 2 diabetes mellitus with diabetic nephropathy: Secondary | ICD-10-CM | POA: Diagnosis not present

## 2019-10-25 DIAGNOSIS — H9192 Unspecified hearing loss, left ear: Secondary | ICD-10-CM

## 2019-10-25 DIAGNOSIS — J449 Chronic obstructive pulmonary disease, unspecified: Secondary | ICD-10-CM

## 2019-10-25 DIAGNOSIS — K649 Unspecified hemorrhoids: Secondary | ICD-10-CM

## 2019-10-25 DIAGNOSIS — K319 Disease of stomach and duodenum, unspecified: Secondary | ICD-10-CM

## 2019-10-28 DIAGNOSIS — Z4901 Encounter for fitting and adjustment of extracorporeal dialysis catheter: Secondary | ICD-10-CM | POA: Diagnosis not present

## 2019-10-28 DIAGNOSIS — D689 Coagulation defect, unspecified: Secondary | ICD-10-CM | POA: Diagnosis not present

## 2019-10-28 DIAGNOSIS — N2581 Secondary hyperparathyroidism of renal origin: Secondary | ICD-10-CM | POA: Diagnosis not present

## 2019-10-28 DIAGNOSIS — D509 Iron deficiency anemia, unspecified: Secondary | ICD-10-CM | POA: Diagnosis not present

## 2019-10-28 DIAGNOSIS — S92355A Nondisplaced fracture of fifth metatarsal bone, left foot, initial encounter for closed fracture: Secondary | ICD-10-CM | POA: Diagnosis not present

## 2019-10-28 DIAGNOSIS — D631 Anemia in chronic kidney disease: Secondary | ICD-10-CM | POA: Diagnosis not present

## 2019-10-28 DIAGNOSIS — E1121 Type 2 diabetes mellitus with diabetic nephropathy: Secondary | ICD-10-CM | POA: Diagnosis not present

## 2019-10-28 DIAGNOSIS — N185 Chronic kidney disease, stage 5: Secondary | ICD-10-CM | POA: Diagnosis not present

## 2019-10-28 DIAGNOSIS — Z992 Dependence on renal dialysis: Secondary | ICD-10-CM | POA: Diagnosis not present

## 2019-10-28 DIAGNOSIS — M79672 Pain in left foot: Secondary | ICD-10-CM | POA: Diagnosis not present

## 2019-10-28 DIAGNOSIS — N186 End stage renal disease: Secondary | ICD-10-CM | POA: Diagnosis not present

## 2019-10-28 DIAGNOSIS — I480 Paroxysmal atrial fibrillation: Secondary | ICD-10-CM | POA: Diagnosis not present

## 2019-10-30 DIAGNOSIS — D509 Iron deficiency anemia, unspecified: Secondary | ICD-10-CM | POA: Diagnosis not present

## 2019-10-30 DIAGNOSIS — D689 Coagulation defect, unspecified: Secondary | ICD-10-CM | POA: Diagnosis not present

## 2019-10-30 DIAGNOSIS — N2581 Secondary hyperparathyroidism of renal origin: Secondary | ICD-10-CM | POA: Diagnosis not present

## 2019-10-30 DIAGNOSIS — E1121 Type 2 diabetes mellitus with diabetic nephropathy: Secondary | ICD-10-CM | POA: Diagnosis not present

## 2019-10-30 DIAGNOSIS — Z4901 Encounter for fitting and adjustment of extracorporeal dialysis catheter: Secondary | ICD-10-CM | POA: Diagnosis not present

## 2019-10-30 DIAGNOSIS — D631 Anemia in chronic kidney disease: Secondary | ICD-10-CM | POA: Diagnosis not present

## 2019-10-30 DIAGNOSIS — N185 Chronic kidney disease, stage 5: Secondary | ICD-10-CM | POA: Diagnosis not present

## 2019-10-30 DIAGNOSIS — N186 End stage renal disease: Secondary | ICD-10-CM | POA: Diagnosis not present

## 2019-10-30 DIAGNOSIS — Z992 Dependence on renal dialysis: Secondary | ICD-10-CM | POA: Diagnosis not present

## 2019-10-31 DIAGNOSIS — I70203 Unspecified atherosclerosis of native arteries of extremities, bilateral legs: Secondary | ICD-10-CM | POA: Diagnosis not present

## 2019-10-31 DIAGNOSIS — B351 Tinea unguium: Secondary | ICD-10-CM | POA: Diagnosis not present

## 2019-10-31 DIAGNOSIS — L84 Corns and callosities: Secondary | ICD-10-CM | POA: Diagnosis not present

## 2019-10-31 DIAGNOSIS — M79676 Pain in unspecified toe(s): Secondary | ICD-10-CM | POA: Diagnosis not present

## 2019-11-01 ENCOUNTER — Other Ambulatory Visit: Payer: Self-pay | Admitting: Family Medicine

## 2019-11-01 DIAGNOSIS — N2581 Secondary hyperparathyroidism of renal origin: Secondary | ICD-10-CM | POA: Diagnosis not present

## 2019-11-01 DIAGNOSIS — E1121 Type 2 diabetes mellitus with diabetic nephropathy: Secondary | ICD-10-CM | POA: Diagnosis not present

## 2019-11-01 DIAGNOSIS — N186 End stage renal disease: Secondary | ICD-10-CM | POA: Diagnosis not present

## 2019-11-01 DIAGNOSIS — E559 Vitamin D deficiency, unspecified: Secondary | ICD-10-CM

## 2019-11-01 DIAGNOSIS — Z992 Dependence on renal dialysis: Secondary | ICD-10-CM | POA: Diagnosis not present

## 2019-11-01 DIAGNOSIS — D689 Coagulation defect, unspecified: Secondary | ICD-10-CM | POA: Diagnosis not present

## 2019-11-01 DIAGNOSIS — Z4901 Encounter for fitting and adjustment of extracorporeal dialysis catheter: Secondary | ICD-10-CM | POA: Diagnosis not present

## 2019-11-01 DIAGNOSIS — D509 Iron deficiency anemia, unspecified: Secondary | ICD-10-CM | POA: Diagnosis not present

## 2019-11-01 DIAGNOSIS — N185 Chronic kidney disease, stage 5: Secondary | ICD-10-CM | POA: Diagnosis not present

## 2019-11-01 DIAGNOSIS — D631 Anemia in chronic kidney disease: Secondary | ICD-10-CM | POA: Diagnosis not present

## 2019-11-02 DIAGNOSIS — N186 End stage renal disease: Secondary | ICD-10-CM | POA: Diagnosis not present

## 2019-11-02 DIAGNOSIS — E1122 Type 2 diabetes mellitus with diabetic chronic kidney disease: Secondary | ICD-10-CM | POA: Diagnosis not present

## 2019-11-02 DIAGNOSIS — Z992 Dependence on renal dialysis: Secondary | ICD-10-CM | POA: Diagnosis not present

## 2019-11-04 DIAGNOSIS — N186 End stage renal disease: Secondary | ICD-10-CM | POA: Diagnosis not present

## 2019-11-04 DIAGNOSIS — N2581 Secondary hyperparathyroidism of renal origin: Secondary | ICD-10-CM | POA: Diagnosis not present

## 2019-11-04 DIAGNOSIS — D509 Iron deficiency anemia, unspecified: Secondary | ICD-10-CM | POA: Diagnosis not present

## 2019-11-04 DIAGNOSIS — D689 Coagulation defect, unspecified: Secondary | ICD-10-CM | POA: Diagnosis not present

## 2019-11-04 DIAGNOSIS — Z992 Dependence on renal dialysis: Secondary | ICD-10-CM | POA: Diagnosis not present

## 2019-11-04 DIAGNOSIS — D631 Anemia in chronic kidney disease: Secondary | ICD-10-CM | POA: Diagnosis not present

## 2019-11-04 DIAGNOSIS — Z4901 Encounter for fitting and adjustment of extracorporeal dialysis catheter: Secondary | ICD-10-CM | POA: Diagnosis not present

## 2019-11-05 DIAGNOSIS — M1030 Gout due to renal impairment, unspecified site: Secondary | ICD-10-CM | POA: Diagnosis not present

## 2019-11-05 DIAGNOSIS — I480 Paroxysmal atrial fibrillation: Secondary | ICD-10-CM | POA: Diagnosis not present

## 2019-11-05 DIAGNOSIS — E1151 Type 2 diabetes mellitus with diabetic peripheral angiopathy without gangrene: Secondary | ICD-10-CM | POA: Diagnosis not present

## 2019-11-05 DIAGNOSIS — I872 Venous insufficiency (chronic) (peripheral): Secondary | ICD-10-CM | POA: Diagnosis not present

## 2019-11-05 DIAGNOSIS — J449 Chronic obstructive pulmonary disease, unspecified: Secondary | ICD-10-CM | POA: Diagnosis not present

## 2019-11-05 DIAGNOSIS — E785 Hyperlipidemia, unspecified: Secondary | ICD-10-CM | POA: Diagnosis not present

## 2019-11-05 DIAGNOSIS — H9192 Unspecified hearing loss, left ear: Secondary | ICD-10-CM | POA: Diagnosis not present

## 2019-11-05 DIAGNOSIS — D631 Anemia in chronic kidney disease: Secondary | ICD-10-CM | POA: Diagnosis not present

## 2019-11-05 DIAGNOSIS — M1990 Unspecified osteoarthritis, unspecified site: Secondary | ICD-10-CM | POA: Diagnosis not present

## 2019-11-05 DIAGNOSIS — N186 End stage renal disease: Secondary | ICD-10-CM | POA: Diagnosis not present

## 2019-11-05 DIAGNOSIS — I120 Hypertensive chronic kidney disease with stage 5 chronic kidney disease or end stage renal disease: Secondary | ICD-10-CM | POA: Diagnosis not present

## 2019-11-05 DIAGNOSIS — E039 Hypothyroidism, unspecified: Secondary | ICD-10-CM | POA: Diagnosis not present

## 2019-11-05 DIAGNOSIS — E1122 Type 2 diabetes mellitus with diabetic chronic kidney disease: Secondary | ICD-10-CM | POA: Diagnosis not present

## 2019-11-06 DIAGNOSIS — Z992 Dependence on renal dialysis: Secondary | ICD-10-CM | POA: Diagnosis not present

## 2019-11-06 DIAGNOSIS — D509 Iron deficiency anemia, unspecified: Secondary | ICD-10-CM | POA: Diagnosis not present

## 2019-11-06 DIAGNOSIS — N2581 Secondary hyperparathyroidism of renal origin: Secondary | ICD-10-CM | POA: Diagnosis not present

## 2019-11-06 DIAGNOSIS — D689 Coagulation defect, unspecified: Secondary | ICD-10-CM | POA: Diagnosis not present

## 2019-11-06 DIAGNOSIS — N186 End stage renal disease: Secondary | ICD-10-CM | POA: Diagnosis not present

## 2019-11-06 DIAGNOSIS — Z4901 Encounter for fitting and adjustment of extracorporeal dialysis catheter: Secondary | ICD-10-CM | POA: Diagnosis not present

## 2019-11-06 DIAGNOSIS — D631 Anemia in chronic kidney disease: Secondary | ICD-10-CM | POA: Diagnosis not present

## 2019-11-08 DIAGNOSIS — Z992 Dependence on renal dialysis: Secondary | ICD-10-CM | POA: Diagnosis not present

## 2019-11-08 DIAGNOSIS — N2581 Secondary hyperparathyroidism of renal origin: Secondary | ICD-10-CM | POA: Diagnosis not present

## 2019-11-08 DIAGNOSIS — Z4901 Encounter for fitting and adjustment of extracorporeal dialysis catheter: Secondary | ICD-10-CM | POA: Diagnosis not present

## 2019-11-08 DIAGNOSIS — N186 End stage renal disease: Secondary | ICD-10-CM | POA: Diagnosis not present

## 2019-11-08 DIAGNOSIS — D631 Anemia in chronic kidney disease: Secondary | ICD-10-CM | POA: Diagnosis not present

## 2019-11-08 DIAGNOSIS — D689 Coagulation defect, unspecified: Secondary | ICD-10-CM | POA: Diagnosis not present

## 2019-11-08 DIAGNOSIS — D509 Iron deficiency anemia, unspecified: Secondary | ICD-10-CM | POA: Diagnosis not present

## 2019-11-11 DIAGNOSIS — Z4901 Encounter for fitting and adjustment of extracorporeal dialysis catheter: Secondary | ICD-10-CM | POA: Diagnosis not present

## 2019-11-11 DIAGNOSIS — N2581 Secondary hyperparathyroidism of renal origin: Secondary | ICD-10-CM | POA: Diagnosis not present

## 2019-11-11 DIAGNOSIS — D689 Coagulation defect, unspecified: Secondary | ICD-10-CM | POA: Diagnosis not present

## 2019-11-11 DIAGNOSIS — N186 End stage renal disease: Secondary | ICD-10-CM | POA: Diagnosis not present

## 2019-11-11 DIAGNOSIS — Z992 Dependence on renal dialysis: Secondary | ICD-10-CM | POA: Diagnosis not present

## 2019-11-11 DIAGNOSIS — D509 Iron deficiency anemia, unspecified: Secondary | ICD-10-CM | POA: Diagnosis not present

## 2019-11-11 DIAGNOSIS — D631 Anemia in chronic kidney disease: Secondary | ICD-10-CM | POA: Diagnosis not present

## 2019-11-13 DIAGNOSIS — N2581 Secondary hyperparathyroidism of renal origin: Secondary | ICD-10-CM | POA: Diagnosis not present

## 2019-11-13 DIAGNOSIS — N186 End stage renal disease: Secondary | ICD-10-CM | POA: Diagnosis not present

## 2019-11-13 DIAGNOSIS — Z4901 Encounter for fitting and adjustment of extracorporeal dialysis catheter: Secondary | ICD-10-CM | POA: Diagnosis not present

## 2019-11-13 DIAGNOSIS — Z992 Dependence on renal dialysis: Secondary | ICD-10-CM | POA: Diagnosis not present

## 2019-11-13 DIAGNOSIS — D631 Anemia in chronic kidney disease: Secondary | ICD-10-CM | POA: Diagnosis not present

## 2019-11-13 DIAGNOSIS — D689 Coagulation defect, unspecified: Secondary | ICD-10-CM | POA: Diagnosis not present

## 2019-11-13 DIAGNOSIS — D509 Iron deficiency anemia, unspecified: Secondary | ICD-10-CM | POA: Diagnosis not present

## 2019-11-15 DIAGNOSIS — N186 End stage renal disease: Secondary | ICD-10-CM | POA: Diagnosis not present

## 2019-11-15 DIAGNOSIS — Z992 Dependence on renal dialysis: Secondary | ICD-10-CM | POA: Diagnosis not present

## 2019-11-15 DIAGNOSIS — D509 Iron deficiency anemia, unspecified: Secondary | ICD-10-CM | POA: Diagnosis not present

## 2019-11-15 DIAGNOSIS — N2581 Secondary hyperparathyroidism of renal origin: Secondary | ICD-10-CM | POA: Diagnosis not present

## 2019-11-15 DIAGNOSIS — D631 Anemia in chronic kidney disease: Secondary | ICD-10-CM | POA: Diagnosis not present

## 2019-11-15 DIAGNOSIS — Z4901 Encounter for fitting and adjustment of extracorporeal dialysis catheter: Secondary | ICD-10-CM | POA: Diagnosis not present

## 2019-11-15 DIAGNOSIS — I10 Essential (primary) hypertension: Secondary | ICD-10-CM | POA: Diagnosis not present

## 2019-11-15 DIAGNOSIS — D689 Coagulation defect, unspecified: Secondary | ICD-10-CM | POA: Diagnosis not present

## 2019-11-18 DIAGNOSIS — D689 Coagulation defect, unspecified: Secondary | ICD-10-CM | POA: Diagnosis not present

## 2019-11-18 DIAGNOSIS — D509 Iron deficiency anemia, unspecified: Secondary | ICD-10-CM | POA: Diagnosis not present

## 2019-11-18 DIAGNOSIS — D631 Anemia in chronic kidney disease: Secondary | ICD-10-CM | POA: Diagnosis not present

## 2019-11-18 DIAGNOSIS — Z992 Dependence on renal dialysis: Secondary | ICD-10-CM | POA: Diagnosis not present

## 2019-11-18 DIAGNOSIS — Z4901 Encounter for fitting and adjustment of extracorporeal dialysis catheter: Secondary | ICD-10-CM | POA: Diagnosis not present

## 2019-11-18 DIAGNOSIS — N2581 Secondary hyperparathyroidism of renal origin: Secondary | ICD-10-CM | POA: Diagnosis not present

## 2019-11-18 DIAGNOSIS — N186 End stage renal disease: Secondary | ICD-10-CM | POA: Diagnosis not present

## 2019-11-19 ENCOUNTER — Other Ambulatory Visit: Payer: Self-pay | Admitting: Family Medicine

## 2019-11-19 DIAGNOSIS — E559 Vitamin D deficiency, unspecified: Secondary | ICD-10-CM

## 2019-11-20 DIAGNOSIS — Z4901 Encounter for fitting and adjustment of extracorporeal dialysis catheter: Secondary | ICD-10-CM | POA: Diagnosis not present

## 2019-11-20 DIAGNOSIS — Z992 Dependence on renal dialysis: Secondary | ICD-10-CM | POA: Diagnosis not present

## 2019-11-20 DIAGNOSIS — D509 Iron deficiency anemia, unspecified: Secondary | ICD-10-CM | POA: Diagnosis not present

## 2019-11-20 DIAGNOSIS — D631 Anemia in chronic kidney disease: Secondary | ICD-10-CM | POA: Diagnosis not present

## 2019-11-20 DIAGNOSIS — N186 End stage renal disease: Secondary | ICD-10-CM | POA: Diagnosis not present

## 2019-11-20 DIAGNOSIS — N2581 Secondary hyperparathyroidism of renal origin: Secondary | ICD-10-CM | POA: Diagnosis not present

## 2019-11-20 DIAGNOSIS — D689 Coagulation defect, unspecified: Secondary | ICD-10-CM | POA: Diagnosis not present

## 2019-11-22 DIAGNOSIS — Z4901 Encounter for fitting and adjustment of extracorporeal dialysis catheter: Secondary | ICD-10-CM | POA: Diagnosis not present

## 2019-11-22 DIAGNOSIS — N2581 Secondary hyperparathyroidism of renal origin: Secondary | ICD-10-CM | POA: Diagnosis not present

## 2019-11-22 DIAGNOSIS — D509 Iron deficiency anemia, unspecified: Secondary | ICD-10-CM | POA: Diagnosis not present

## 2019-11-22 DIAGNOSIS — Z992 Dependence on renal dialysis: Secondary | ICD-10-CM | POA: Diagnosis not present

## 2019-11-22 DIAGNOSIS — N186 End stage renal disease: Secondary | ICD-10-CM | POA: Diagnosis not present

## 2019-11-22 DIAGNOSIS — D631 Anemia in chronic kidney disease: Secondary | ICD-10-CM | POA: Diagnosis not present

## 2019-11-22 DIAGNOSIS — D689 Coagulation defect, unspecified: Secondary | ICD-10-CM | POA: Diagnosis not present

## 2019-11-25 DIAGNOSIS — D631 Anemia in chronic kidney disease: Secondary | ICD-10-CM | POA: Diagnosis not present

## 2019-11-25 DIAGNOSIS — D509 Iron deficiency anemia, unspecified: Secondary | ICD-10-CM | POA: Diagnosis not present

## 2019-11-25 DIAGNOSIS — N2581 Secondary hyperparathyroidism of renal origin: Secondary | ICD-10-CM | POA: Diagnosis not present

## 2019-11-25 DIAGNOSIS — N186 End stage renal disease: Secondary | ICD-10-CM | POA: Diagnosis not present

## 2019-11-25 DIAGNOSIS — Z4901 Encounter for fitting and adjustment of extracorporeal dialysis catheter: Secondary | ICD-10-CM | POA: Diagnosis not present

## 2019-11-25 DIAGNOSIS — D689 Coagulation defect, unspecified: Secondary | ICD-10-CM | POA: Diagnosis not present

## 2019-11-25 DIAGNOSIS — Z992 Dependence on renal dialysis: Secondary | ICD-10-CM | POA: Diagnosis not present

## 2019-11-26 DIAGNOSIS — S92354D Nondisplaced fracture of fifth metatarsal bone, right foot, subsequent encounter for fracture with routine healing: Secondary | ICD-10-CM | POA: Diagnosis not present

## 2019-11-27 DIAGNOSIS — N2581 Secondary hyperparathyroidism of renal origin: Secondary | ICD-10-CM | POA: Diagnosis not present

## 2019-11-27 DIAGNOSIS — Z992 Dependence on renal dialysis: Secondary | ICD-10-CM | POA: Diagnosis not present

## 2019-11-27 DIAGNOSIS — Z4901 Encounter for fitting and adjustment of extracorporeal dialysis catheter: Secondary | ICD-10-CM | POA: Diagnosis not present

## 2019-11-27 DIAGNOSIS — N186 End stage renal disease: Secondary | ICD-10-CM | POA: Diagnosis not present

## 2019-11-27 DIAGNOSIS — D631 Anemia in chronic kidney disease: Secondary | ICD-10-CM | POA: Diagnosis not present

## 2019-11-27 DIAGNOSIS — D689 Coagulation defect, unspecified: Secondary | ICD-10-CM | POA: Diagnosis not present

## 2019-11-27 DIAGNOSIS — D509 Iron deficiency anemia, unspecified: Secondary | ICD-10-CM | POA: Diagnosis not present

## 2019-11-28 DIAGNOSIS — I480 Paroxysmal atrial fibrillation: Secondary | ICD-10-CM | POA: Diagnosis not present

## 2019-11-29 DIAGNOSIS — N186 End stage renal disease: Secondary | ICD-10-CM | POA: Diagnosis not present

## 2019-11-29 DIAGNOSIS — N2581 Secondary hyperparathyroidism of renal origin: Secondary | ICD-10-CM | POA: Diagnosis not present

## 2019-11-29 DIAGNOSIS — E119 Type 2 diabetes mellitus without complications: Secondary | ICD-10-CM | POA: Diagnosis not present

## 2019-11-29 DIAGNOSIS — Z8616 Personal history of COVID-19: Secondary | ICD-10-CM | POA: Diagnosis not present

## 2019-11-29 DIAGNOSIS — I4891 Unspecified atrial fibrillation: Secondary | ICD-10-CM | POA: Diagnosis not present

## 2019-11-29 DIAGNOSIS — S72002A Fracture of unspecified part of neck of left femur, initial encounter for closed fracture: Secondary | ICD-10-CM | POA: Diagnosis not present

## 2019-11-29 DIAGNOSIS — W1830XA Fall on same level, unspecified, initial encounter: Secondary | ICD-10-CM | POA: Diagnosis not present

## 2019-11-29 DIAGNOSIS — Z4901 Encounter for fitting and adjustment of extracorporeal dialysis catheter: Secondary | ICD-10-CM | POA: Diagnosis not present

## 2019-11-29 DIAGNOSIS — M25552 Pain in left hip: Secondary | ICD-10-CM | POA: Diagnosis not present

## 2019-11-29 DIAGNOSIS — W1789XA Other fall from one level to another, initial encounter: Secondary | ICD-10-CM | POA: Diagnosis not present

## 2019-11-29 DIAGNOSIS — D631 Anemia in chronic kidney disease: Secondary | ICD-10-CM | POA: Diagnosis not present

## 2019-11-29 DIAGNOSIS — D509 Iron deficiency anemia, unspecified: Secondary | ICD-10-CM | POA: Diagnosis not present

## 2019-11-29 DIAGNOSIS — Z992 Dependence on renal dialysis: Secondary | ICD-10-CM | POA: Diagnosis not present

## 2019-11-29 DIAGNOSIS — E039 Hypothyroidism, unspecified: Secondary | ICD-10-CM | POA: Diagnosis not present

## 2019-11-29 DIAGNOSIS — Y92009 Unspecified place in unspecified non-institutional (private) residence as the place of occurrence of the external cause: Secondary | ICD-10-CM | POA: Diagnosis not present

## 2019-11-29 DIAGNOSIS — D689 Coagulation defect, unspecified: Secondary | ICD-10-CM | POA: Diagnosis not present

## 2019-11-29 DIAGNOSIS — Z049 Encounter for examination and observation for unspecified reason: Secondary | ICD-10-CM | POA: Diagnosis not present

## 2019-11-29 DIAGNOSIS — S32512A Fracture of superior rim of left pubis, initial encounter for closed fracture: Secondary | ICD-10-CM | POA: Diagnosis not present

## 2019-11-29 DIAGNOSIS — Y939 Activity, unspecified: Secondary | ICD-10-CM | POA: Diagnosis not present

## 2019-11-30 DIAGNOSIS — S72002D Fracture of unspecified part of neck of left femur, subsequent encounter for closed fracture with routine healing: Secondary | ICD-10-CM | POA: Diagnosis not present

## 2019-11-30 DIAGNOSIS — E875 Hyperkalemia: Secondary | ICD-10-CM | POA: Diagnosis not present

## 2019-11-30 DIAGNOSIS — E1122 Type 2 diabetes mellitus with diabetic chronic kidney disease: Secondary | ICD-10-CM | POA: Diagnosis not present

## 2019-11-30 DIAGNOSIS — E1165 Type 2 diabetes mellitus with hyperglycemia: Secondary | ICD-10-CM | POA: Diagnosis not present

## 2019-11-30 DIAGNOSIS — R296 Repeated falls: Secondary | ICD-10-CM | POA: Diagnosis not present

## 2019-11-30 DIAGNOSIS — K219 Gastro-esophageal reflux disease without esophagitis: Secondary | ICD-10-CM | POA: Diagnosis not present

## 2019-11-30 DIAGNOSIS — M6281 Muscle weakness (generalized): Secondary | ICD-10-CM | POA: Diagnosis not present

## 2019-11-30 DIAGNOSIS — S0083XA Contusion of other part of head, initial encounter: Secondary | ICD-10-CM | POA: Diagnosis not present

## 2019-11-30 DIAGNOSIS — Z87891 Personal history of nicotine dependence: Secondary | ICD-10-CM | POA: Diagnosis not present

## 2019-11-30 DIAGNOSIS — Z8616 Personal history of COVID-19: Secondary | ICD-10-CM | POA: Diagnosis not present

## 2019-11-30 DIAGNOSIS — I4891 Unspecified atrial fibrillation: Secondary | ICD-10-CM | POA: Diagnosis not present

## 2019-11-30 DIAGNOSIS — E559 Vitamin D deficiency, unspecified: Secondary | ICD-10-CM | POA: Diagnosis not present

## 2019-11-30 DIAGNOSIS — I480 Paroxysmal atrial fibrillation: Secondary | ICD-10-CM | POA: Diagnosis not present

## 2019-11-30 DIAGNOSIS — R29898 Other symptoms and signs involving the musculoskeletal system: Secondary | ICD-10-CM | POA: Diagnosis not present

## 2019-11-30 DIAGNOSIS — W1789XA Other fall from one level to another, initial encounter: Secondary | ICD-10-CM | POA: Diagnosis not present

## 2019-11-30 DIAGNOSIS — Z992 Dependence on renal dialysis: Secondary | ICD-10-CM | POA: Diagnosis not present

## 2019-11-30 DIAGNOSIS — E039 Hypothyroidism, unspecified: Secondary | ICD-10-CM | POA: Diagnosis not present

## 2019-11-30 DIAGNOSIS — Y92009 Unspecified place in unspecified non-institutional (private) residence as the place of occurrence of the external cause: Secondary | ICD-10-CM | POA: Diagnosis not present

## 2019-11-30 DIAGNOSIS — G2581 Restless legs syndrome: Secondary | ICD-10-CM | POA: Diagnosis not present

## 2019-11-30 DIAGNOSIS — S72002A Fracture of unspecified part of neck of left femur, initial encounter for closed fracture: Secondary | ICD-10-CM | POA: Diagnosis not present

## 2019-11-30 DIAGNOSIS — E119 Type 2 diabetes mellitus without complications: Secondary | ICD-10-CM | POA: Diagnosis not present

## 2019-11-30 DIAGNOSIS — R2681 Unsteadiness on feet: Secondary | ICD-10-CM | POA: Diagnosis not present

## 2019-11-30 DIAGNOSIS — Z452 Encounter for adjustment and management of vascular access device: Secondary | ICD-10-CM | POA: Diagnosis not present

## 2019-11-30 DIAGNOSIS — Z880 Allergy status to penicillin: Secondary | ICD-10-CM | POA: Diagnosis not present

## 2019-11-30 DIAGNOSIS — Y939 Activity, unspecified: Secondary | ICD-10-CM | POA: Diagnosis not present

## 2019-11-30 DIAGNOSIS — Z7982 Long term (current) use of aspirin: Secondary | ICD-10-CM | POA: Diagnosis not present

## 2019-11-30 DIAGNOSIS — D62 Acute posthemorrhagic anemia: Secondary | ICD-10-CM | POA: Diagnosis not present

## 2019-11-30 DIAGNOSIS — I959 Hypotension, unspecified: Secondary | ICD-10-CM | POA: Diagnosis not present

## 2019-11-30 DIAGNOSIS — J449 Chronic obstructive pulmonary disease, unspecified: Secondary | ICD-10-CM | POA: Diagnosis not present

## 2019-11-30 DIAGNOSIS — S32512A Fracture of superior rim of left pubis, initial encounter for closed fracture: Secondary | ICD-10-CM | POA: Diagnosis not present

## 2019-11-30 DIAGNOSIS — Z96642 Presence of left artificial hip joint: Secondary | ICD-10-CM | POA: Diagnosis not present

## 2019-11-30 DIAGNOSIS — N186 End stage renal disease: Secondary | ICD-10-CM | POA: Diagnosis not present

## 2019-11-30 DIAGNOSIS — Z885 Allergy status to narcotic agent status: Secondary | ICD-10-CM | POA: Diagnosis not present

## 2019-11-30 DIAGNOSIS — Z96653 Presence of artificial knee joint, bilateral: Secondary | ICD-10-CM | POA: Diagnosis not present

## 2019-11-30 DIAGNOSIS — Z471 Aftercare following joint replacement surgery: Secondary | ICD-10-CM | POA: Diagnosis not present

## 2019-11-30 DIAGNOSIS — D500 Iron deficiency anemia secondary to blood loss (chronic): Secondary | ICD-10-CM | POA: Diagnosis not present

## 2019-11-30 DIAGNOSIS — Z79899 Other long term (current) drug therapy: Secondary | ICD-10-CM | POA: Diagnosis not present

## 2019-11-30 DIAGNOSIS — Z049 Encounter for examination and observation for unspecified reason: Secondary | ICD-10-CM | POA: Diagnosis not present

## 2019-11-30 DIAGNOSIS — D72829 Elevated white blood cell count, unspecified: Secondary | ICD-10-CM | POA: Diagnosis not present

## 2019-11-30 DIAGNOSIS — M25552 Pain in left hip: Secondary | ICD-10-CM | POA: Diagnosis not present

## 2019-11-30 DIAGNOSIS — Z882 Allergy status to sulfonamides status: Secondary | ICD-10-CM | POA: Diagnosis not present

## 2019-11-30 DIAGNOSIS — Z743 Need for continuous supervision: Secondary | ICD-10-CM | POA: Diagnosis not present

## 2019-11-30 DIAGNOSIS — S72042A Displaced fracture of base of neck of left femur, initial encounter for closed fracture: Secondary | ICD-10-CM | POA: Diagnosis not present

## 2019-12-01 DIAGNOSIS — E1165 Type 2 diabetes mellitus with hyperglycemia: Secondary | ICD-10-CM | POA: Diagnosis not present

## 2019-12-01 DIAGNOSIS — Z992 Dependence on renal dialysis: Secondary | ICD-10-CM | POA: Diagnosis not present

## 2019-12-01 DIAGNOSIS — I480 Paroxysmal atrial fibrillation: Secondary | ICD-10-CM | POA: Diagnosis not present

## 2019-12-01 DIAGNOSIS — N186 End stage renal disease: Secondary | ICD-10-CM | POA: Diagnosis not present

## 2019-12-01 DIAGNOSIS — E039 Hypothyroidism, unspecified: Secondary | ICD-10-CM | POA: Diagnosis not present

## 2019-12-01 DIAGNOSIS — S72002A Fracture of unspecified part of neck of left femur, initial encounter for closed fracture: Secondary | ICD-10-CM | POA: Diagnosis not present

## 2019-12-02 DIAGNOSIS — E1165 Type 2 diabetes mellitus with hyperglycemia: Secondary | ICD-10-CM | POA: Diagnosis not present

## 2019-12-02 DIAGNOSIS — N186 End stage renal disease: Secondary | ICD-10-CM | POA: Diagnosis not present

## 2019-12-02 DIAGNOSIS — E039 Hypothyroidism, unspecified: Secondary | ICD-10-CM | POA: Diagnosis not present

## 2019-12-02 DIAGNOSIS — Z992 Dependence on renal dialysis: Secondary | ICD-10-CM | POA: Diagnosis not present

## 2019-12-02 DIAGNOSIS — I480 Paroxysmal atrial fibrillation: Secondary | ICD-10-CM | POA: Diagnosis not present

## 2019-12-02 DIAGNOSIS — S72002A Fracture of unspecified part of neck of left femur, initial encounter for closed fracture: Secondary | ICD-10-CM | POA: Diagnosis not present

## 2019-12-03 DIAGNOSIS — Z992 Dependence on renal dialysis: Secondary | ICD-10-CM | POA: Diagnosis not present

## 2019-12-03 DIAGNOSIS — E875 Hyperkalemia: Secondary | ICD-10-CM | POA: Diagnosis not present

## 2019-12-03 DIAGNOSIS — D62 Acute posthemorrhagic anemia: Secondary | ICD-10-CM | POA: Diagnosis not present

## 2019-12-03 DIAGNOSIS — S72002A Fracture of unspecified part of neck of left femur, initial encounter for closed fracture: Secondary | ICD-10-CM | POA: Diagnosis not present

## 2019-12-03 DIAGNOSIS — N186 End stage renal disease: Secondary | ICD-10-CM | POA: Diagnosis not present

## 2019-12-03 DIAGNOSIS — E1122 Type 2 diabetes mellitus with diabetic chronic kidney disease: Secondary | ICD-10-CM | POA: Diagnosis not present

## 2019-12-03 DIAGNOSIS — I480 Paroxysmal atrial fibrillation: Secondary | ICD-10-CM | POA: Diagnosis not present

## 2019-12-03 DIAGNOSIS — E1165 Type 2 diabetes mellitus with hyperglycemia: Secondary | ICD-10-CM | POA: Diagnosis not present

## 2019-12-04 DIAGNOSIS — Z992 Dependence on renal dialysis: Secondary | ICD-10-CM | POA: Diagnosis not present

## 2019-12-04 DIAGNOSIS — I480 Paroxysmal atrial fibrillation: Secondary | ICD-10-CM | POA: Diagnosis not present

## 2019-12-04 DIAGNOSIS — N186 End stage renal disease: Secondary | ICD-10-CM | POA: Diagnosis not present

## 2019-12-04 DIAGNOSIS — S72002A Fracture of unspecified part of neck of left femur, initial encounter for closed fracture: Secondary | ICD-10-CM | POA: Diagnosis not present

## 2019-12-04 DIAGNOSIS — E875 Hyperkalemia: Secondary | ICD-10-CM | POA: Diagnosis not present

## 2019-12-04 DIAGNOSIS — D62 Acute posthemorrhagic anemia: Secondary | ICD-10-CM | POA: Diagnosis not present

## 2019-12-05 ENCOUNTER — Ambulatory Visit: Payer: Medicare Other | Admitting: Family Medicine

## 2019-12-05 DIAGNOSIS — D62 Acute posthemorrhagic anemia: Secondary | ICD-10-CM | POA: Diagnosis not present

## 2019-12-05 DIAGNOSIS — S72002A Fracture of unspecified part of neck of left femur, initial encounter for closed fracture: Secondary | ICD-10-CM | POA: Diagnosis not present

## 2019-12-05 DIAGNOSIS — I480 Paroxysmal atrial fibrillation: Secondary | ICD-10-CM | POA: Diagnosis not present

## 2019-12-05 DIAGNOSIS — E875 Hyperkalemia: Secondary | ICD-10-CM | POA: Diagnosis not present

## 2019-12-05 DIAGNOSIS — N186 End stage renal disease: Secondary | ICD-10-CM | POA: Diagnosis not present

## 2019-12-05 DIAGNOSIS — Z992 Dependence on renal dialysis: Secondary | ICD-10-CM | POA: Diagnosis not present

## 2019-12-05 DIAGNOSIS — E1165 Type 2 diabetes mellitus with hyperglycemia: Secondary | ICD-10-CM | POA: Diagnosis not present

## 2019-12-06 DIAGNOSIS — Z79899 Other long term (current) drug therapy: Secondary | ICD-10-CM | POA: Diagnosis not present

## 2019-12-06 DIAGNOSIS — I5022 Chronic systolic (congestive) heart failure: Secondary | ICD-10-CM | POA: Diagnosis not present

## 2019-12-06 DIAGNOSIS — R0689 Other abnormalities of breathing: Secondary | ICD-10-CM | POA: Diagnosis not present

## 2019-12-06 DIAGNOSIS — T8130XA Disruption of wound, unspecified, initial encounter: Secondary | ICD-10-CM | POA: Diagnosis not present

## 2019-12-06 DIAGNOSIS — Z992 Dependence on renal dialysis: Secondary | ICD-10-CM | POA: Diagnosis not present

## 2019-12-06 DIAGNOSIS — D62 Acute posthemorrhagic anemia: Secondary | ICD-10-CM | POA: Diagnosis not present

## 2019-12-06 DIAGNOSIS — A499 Bacterial infection, unspecified: Secondary | ICD-10-CM | POA: Diagnosis not present

## 2019-12-06 DIAGNOSIS — Z4801 Encounter for change or removal of surgical wound dressing: Secondary | ICD-10-CM | POA: Diagnosis not present

## 2019-12-06 DIAGNOSIS — T8452XA Infection and inflammatory reaction due to internal left hip prosthesis, initial encounter: Secondary | ICD-10-CM | POA: Diagnosis not present

## 2019-12-06 DIAGNOSIS — I120 Hypertensive chronic kidney disease with stage 5 chronic kidney disease or end stage renal disease: Secondary | ICD-10-CM | POA: Diagnosis not present

## 2019-12-06 DIAGNOSIS — D631 Anemia in chronic kidney disease: Secondary | ICD-10-CM | POA: Diagnosis not present

## 2019-12-06 DIAGNOSIS — E1165 Type 2 diabetes mellitus with hyperglycemia: Secondary | ICD-10-CM | POA: Diagnosis not present

## 2019-12-06 DIAGNOSIS — Z87891 Personal history of nicotine dependence: Secondary | ICD-10-CM | POA: Diagnosis not present

## 2019-12-06 DIAGNOSIS — D689 Coagulation defect, unspecified: Secondary | ICD-10-CM | POA: Diagnosis not present

## 2019-12-06 DIAGNOSIS — Z7982 Long term (current) use of aspirin: Secondary | ICD-10-CM | POA: Diagnosis not present

## 2019-12-06 DIAGNOSIS — Z96653 Presence of artificial knee joint, bilateral: Secondary | ICD-10-CM | POA: Diagnosis not present

## 2019-12-06 DIAGNOSIS — R29898 Other symptoms and signs involving the musculoskeletal system: Secondary | ICD-10-CM | POA: Diagnosis not present

## 2019-12-06 DIAGNOSIS — K219 Gastro-esophageal reflux disease without esophagitis: Secondary | ICD-10-CM | POA: Diagnosis not present

## 2019-12-06 DIAGNOSIS — G2581 Restless legs syndrome: Secondary | ICD-10-CM | POA: Diagnosis not present

## 2019-12-06 DIAGNOSIS — Z96642 Presence of left artificial hip joint: Secondary | ICD-10-CM | POA: Diagnosis not present

## 2019-12-06 DIAGNOSIS — A419 Sepsis, unspecified organism: Secondary | ICD-10-CM | POA: Diagnosis not present

## 2019-12-06 DIAGNOSIS — Z7983 Long term (current) use of bisphosphonates: Secondary | ICD-10-CM | POA: Diagnosis not present

## 2019-12-06 DIAGNOSIS — M2550 Pain in unspecified joint: Secondary | ICD-10-CM | POA: Diagnosis not present

## 2019-12-06 DIAGNOSIS — R6521 Severe sepsis with septic shock: Secondary | ICD-10-CM | POA: Diagnosis not present

## 2019-12-06 DIAGNOSIS — R5381 Other malaise: Secondary | ICD-10-CM | POA: Diagnosis not present

## 2019-12-06 DIAGNOSIS — L7622 Postprocedural hemorrhage and hematoma of skin and subcutaneous tissue following other procedure: Secondary | ICD-10-CM | POA: Diagnosis not present

## 2019-12-06 DIAGNOSIS — T8149XA Infection following a procedure, other surgical site, initial encounter: Secondary | ICD-10-CM | POA: Diagnosis not present

## 2019-12-06 DIAGNOSIS — E1169 Type 2 diabetes mellitus with other specified complication: Secondary | ICD-10-CM | POA: Diagnosis not present

## 2019-12-06 DIAGNOSIS — Z20822 Contact with and (suspected) exposure to covid-19: Secondary | ICD-10-CM | POA: Diagnosis not present

## 2019-12-06 DIAGNOSIS — M6281 Muscle weakness (generalized): Secondary | ICD-10-CM | POA: Diagnosis not present

## 2019-12-06 DIAGNOSIS — Z4901 Encounter for fitting and adjustment of extracorporeal dialysis catheter: Secondary | ICD-10-CM | POA: Diagnosis not present

## 2019-12-06 DIAGNOSIS — D500 Iron deficiency anemia secondary to blood loss (chronic): Secondary | ICD-10-CM | POA: Diagnosis not present

## 2019-12-06 DIAGNOSIS — E1122 Type 2 diabetes mellitus with diabetic chronic kidney disease: Secondary | ICD-10-CM | POA: Diagnosis not present

## 2019-12-06 DIAGNOSIS — Z7951 Long term (current) use of inhaled steroids: Secondary | ICD-10-CM | POA: Diagnosis not present

## 2019-12-06 DIAGNOSIS — Z96612 Presence of left artificial shoulder joint: Secondary | ICD-10-CM | POA: Diagnosis not present

## 2019-12-06 DIAGNOSIS — E039 Hypothyroidism, unspecified: Secondary | ICD-10-CM | POA: Diagnosis not present

## 2019-12-06 DIAGNOSIS — Z79890 Hormone replacement therapy (postmenopausal): Secondary | ICD-10-CM | POA: Diagnosis not present

## 2019-12-06 DIAGNOSIS — I130 Hypertensive heart and chronic kidney disease with heart failure and stage 1 through stage 4 chronic kidney disease, or unspecified chronic kidney disease: Secondary | ICD-10-CM | POA: Diagnosis not present

## 2019-12-06 DIAGNOSIS — A498 Other bacterial infections of unspecified site: Secondary | ICD-10-CM | POA: Diagnosis not present

## 2019-12-06 DIAGNOSIS — E875 Hyperkalemia: Secondary | ICD-10-CM | POA: Diagnosis not present

## 2019-12-06 DIAGNOSIS — S72002A Fracture of unspecified part of neck of left femur, initial encounter for closed fracture: Secondary | ICD-10-CM | POA: Diagnosis not present

## 2019-12-06 DIAGNOSIS — R2681 Unsteadiness on feet: Secondary | ICD-10-CM | POA: Diagnosis not present

## 2019-12-06 DIAGNOSIS — J449 Chronic obstructive pulmonary disease, unspecified: Secondary | ICD-10-CM | POA: Diagnosis not present

## 2019-12-06 DIAGNOSIS — S71012A Laceration without foreign body, left hip, initial encounter: Secondary | ICD-10-CM | POA: Diagnosis not present

## 2019-12-06 DIAGNOSIS — Z743 Need for continuous supervision: Secondary | ICD-10-CM | POA: Diagnosis not present

## 2019-12-06 DIAGNOSIS — T8131XA Disruption of external operation (surgical) wound, not elsewhere classified, initial encounter: Secondary | ICD-10-CM | POA: Diagnosis not present

## 2019-12-06 DIAGNOSIS — Z7401 Bed confinement status: Secondary | ICD-10-CM | POA: Diagnosis not present

## 2019-12-06 DIAGNOSIS — Z7901 Long term (current) use of anticoagulants: Secondary | ICD-10-CM | POA: Diagnosis not present

## 2019-12-06 DIAGNOSIS — E559 Vitamin D deficiency, unspecified: Secondary | ICD-10-CM | POA: Diagnosis not present

## 2019-12-06 DIAGNOSIS — S72002D Fracture of unspecified part of neck of left femur, subsequent encounter for closed fracture with routine healing: Secondary | ICD-10-CM | POA: Diagnosis not present

## 2019-12-06 DIAGNOSIS — M25559 Pain in unspecified hip: Secondary | ICD-10-CM | POA: Diagnosis not present

## 2019-12-06 DIAGNOSIS — E119 Type 2 diabetes mellitus without complications: Secondary | ICD-10-CM | POA: Diagnosis not present

## 2019-12-06 DIAGNOSIS — I480 Paroxysmal atrial fibrillation: Secondary | ICD-10-CM | POA: Diagnosis not present

## 2019-12-06 DIAGNOSIS — I10 Essential (primary) hypertension: Secondary | ICD-10-CM | POA: Diagnosis not present

## 2019-12-06 DIAGNOSIS — N2581 Secondary hyperparathyroidism of renal origin: Secondary | ICD-10-CM | POA: Diagnosis not present

## 2019-12-06 DIAGNOSIS — N186 End stage renal disease: Secondary | ICD-10-CM | POA: Diagnosis not present

## 2019-12-06 DIAGNOSIS — M25552 Pain in left hip: Secondary | ICD-10-CM | POA: Diagnosis not present

## 2019-12-06 DIAGNOSIS — R296 Repeated falls: Secondary | ICD-10-CM | POA: Diagnosis not present

## 2019-12-06 DIAGNOSIS — R58 Hemorrhage, not elsewhere classified: Secondary | ICD-10-CM | POA: Diagnosis not present

## 2019-12-09 DIAGNOSIS — Z992 Dependence on renal dialysis: Secondary | ICD-10-CM | POA: Diagnosis not present

## 2019-12-09 DIAGNOSIS — D689 Coagulation defect, unspecified: Secondary | ICD-10-CM | POA: Diagnosis not present

## 2019-12-09 DIAGNOSIS — D631 Anemia in chronic kidney disease: Secondary | ICD-10-CM | POA: Diagnosis not present

## 2019-12-09 DIAGNOSIS — I480 Paroxysmal atrial fibrillation: Secondary | ICD-10-CM | POA: Diagnosis not present

## 2019-12-09 DIAGNOSIS — A499 Bacterial infection, unspecified: Secondary | ICD-10-CM | POA: Diagnosis not present

## 2019-12-09 DIAGNOSIS — N2581 Secondary hyperparathyroidism of renal origin: Secondary | ICD-10-CM | POA: Diagnosis not present

## 2019-12-09 DIAGNOSIS — N186 End stage renal disease: Secondary | ICD-10-CM | POA: Diagnosis not present

## 2019-12-09 DIAGNOSIS — E1169 Type 2 diabetes mellitus with other specified complication: Secondary | ICD-10-CM | POA: Diagnosis not present

## 2019-12-09 DIAGNOSIS — Z4901 Encounter for fitting and adjustment of extracorporeal dialysis catheter: Secondary | ICD-10-CM | POA: Diagnosis not present

## 2019-12-09 DIAGNOSIS — J449 Chronic obstructive pulmonary disease, unspecified: Secondary | ICD-10-CM | POA: Diagnosis not present

## 2019-12-11 ENCOUNTER — Emergency Department (HOSPITAL_COMMUNITY)
Admission: EM | Admit: 2019-12-11 | Discharge: 2019-12-11 | Disposition: A | Discharge status: Home / Self Care | Payer: Medicare Other | Attending: Emergency Medicine | Admitting: Emergency Medicine

## 2019-12-11 ENCOUNTER — Other Ambulatory Visit: Payer: Self-pay

## 2019-12-11 DIAGNOSIS — I120 Hypertensive chronic kidney disease with stage 5 chronic kidney disease or end stage renal disease: Secondary | ICD-10-CM | POA: Insufficient documentation

## 2019-12-11 DIAGNOSIS — Z20822 Contact with and (suspected) exposure to covid-19: Secondary | ICD-10-CM | POA: Insufficient documentation

## 2019-12-11 DIAGNOSIS — R5381 Other malaise: Secondary | ICD-10-CM | POA: Diagnosis not present

## 2019-12-11 DIAGNOSIS — N186 End stage renal disease: Secondary | ICD-10-CM | POA: Diagnosis not present

## 2019-12-11 DIAGNOSIS — Z743 Need for continuous supervision: Secondary | ICD-10-CM | POA: Diagnosis not present

## 2019-12-11 DIAGNOSIS — J449 Chronic obstructive pulmonary disease, unspecified: Secondary | ICD-10-CM | POA: Diagnosis not present

## 2019-12-11 DIAGNOSIS — E039 Hypothyroidism, unspecified: Secondary | ICD-10-CM | POA: Insufficient documentation

## 2019-12-11 DIAGNOSIS — Z79899 Other long term (current) drug therapy: Secondary | ICD-10-CM | POA: Diagnosis not present

## 2019-12-11 DIAGNOSIS — Z96612 Presence of left artificial shoulder joint: Secondary | ICD-10-CM | POA: Insufficient documentation

## 2019-12-11 DIAGNOSIS — E1122 Type 2 diabetes mellitus with diabetic chronic kidney disease: Secondary | ICD-10-CM | POA: Insufficient documentation

## 2019-12-11 DIAGNOSIS — Z7951 Long term (current) use of inhaled steroids: Secondary | ICD-10-CM | POA: Diagnosis not present

## 2019-12-11 DIAGNOSIS — Z96653 Presence of artificial knee joint, bilateral: Secondary | ICD-10-CM | POA: Diagnosis not present

## 2019-12-11 DIAGNOSIS — Z87891 Personal history of nicotine dependence: Secondary | ICD-10-CM | POA: Diagnosis not present

## 2019-12-11 DIAGNOSIS — Z4889 Encounter for other specified surgical aftercare: Secondary | ICD-10-CM

## 2019-12-11 DIAGNOSIS — Z992 Dependence on renal dialysis: Secondary | ICD-10-CM | POA: Insufficient documentation

## 2019-12-11 DIAGNOSIS — Z79890 Hormone replacement therapy (postmenopausal): Secondary | ICD-10-CM | POA: Insufficient documentation

## 2019-12-11 DIAGNOSIS — Z7401 Bed confinement status: Secondary | ICD-10-CM | POA: Diagnosis not present

## 2019-12-11 DIAGNOSIS — M2550 Pain in unspecified joint: Secondary | ICD-10-CM | POA: Diagnosis not present

## 2019-12-11 DIAGNOSIS — Z7982 Long term (current) use of aspirin: Secondary | ICD-10-CM | POA: Diagnosis not present

## 2019-12-11 DIAGNOSIS — L7622 Postprocedural hemorrhage and hematoma of skin and subcutaneous tissue following other procedure: Secondary | ICD-10-CM | POA: Diagnosis not present

## 2019-12-11 DIAGNOSIS — R0689 Other abnormalities of breathing: Secondary | ICD-10-CM | POA: Diagnosis not present

## 2019-12-11 DIAGNOSIS — R58 Hemorrhage, not elsewhere classified: Secondary | ICD-10-CM | POA: Diagnosis not present

## 2019-12-11 DIAGNOSIS — Z4801 Encounter for change or removal of surgical wound dressing: Secondary | ICD-10-CM | POA: Diagnosis not present

## 2019-12-11 LAB — CBC WITH DIFFERENTIAL/PLATELET
Abs Immature Granulocytes: 0.16 10*3/uL — ABNORMAL HIGH (ref 0.00–0.07)
Basophils Absolute: 0.1 10*3/uL (ref 0.0–0.1)
Basophils Relative: 1 %
Eosinophils Absolute: 0.5 10*3/uL (ref 0.0–0.5)
Eosinophils Relative: 4 %
HCT: 31.4 % — ABNORMAL LOW (ref 36.0–46.0)
Hemoglobin: 9.5 g/dL — ABNORMAL LOW (ref 12.0–15.0)
Immature Granulocytes: 1 %
Lymphocytes Relative: 10 %
Lymphs Abs: 1.3 10*3/uL (ref 0.7–4.0)
MCH: 30.4 pg (ref 26.0–34.0)
MCHC: 30.3 g/dL (ref 30.0–36.0)
MCV: 100.6 fL — ABNORMAL HIGH (ref 80.0–100.0)
Monocytes Absolute: 1.4 10*3/uL — ABNORMAL HIGH (ref 0.1–1.0)
Monocytes Relative: 11 %
Neutro Abs: 9.5 10*3/uL — ABNORMAL HIGH (ref 1.7–7.7)
Neutrophils Relative %: 73 %
Platelets: 396 10*3/uL (ref 150–400)
RBC: 3.12 MIL/uL — ABNORMAL LOW (ref 3.87–5.11)
RDW: 16.4 % — ABNORMAL HIGH (ref 11.5–15.5)
WBC: 12.8 10*3/uL — ABNORMAL HIGH (ref 4.0–10.5)
nRBC: 0 % (ref 0.0–0.2)

## 2019-12-11 LAB — COMPREHENSIVE METABOLIC PANEL
ALT: 17 U/L (ref 0–44)
AST: 19 U/L (ref 15–41)
Albumin: 2.8 g/dL — ABNORMAL LOW (ref 3.5–5.0)
Alkaline Phosphatase: 115 U/L (ref 38–126)
Anion gap: 16 — ABNORMAL HIGH (ref 5–15)
BUN: 54 mg/dL — ABNORMAL HIGH (ref 8–23)
CO2: 26 mmol/L (ref 22–32)
Calcium: 8.2 mg/dL — ABNORMAL LOW (ref 8.9–10.3)
Chloride: 97 mmol/L — ABNORMAL LOW (ref 98–111)
Creatinine, Ser: 6.36 mg/dL — ABNORMAL HIGH (ref 0.44–1.00)
GFR calc Af Amer: 7 mL/min — ABNORMAL LOW (ref >60)
GFR calc non Af Amer: 6 mL/min — ABNORMAL LOW (ref >60)
Glucose, Bld: 102 mg/dL — ABNORMAL HIGH (ref 70–99)
Potassium: 4.8 mmol/L (ref 3.5–5.1)
Sodium: 139 mmol/L (ref 135–145)
Total Bilirubin: 0.7 mg/dL (ref 0.3–1.2)
Total Protein: 6.1 g/dL — ABNORMAL LOW (ref 6.5–8.1)

## 2019-12-11 LAB — SARS CORONAVIRUS 2 BY RT PCR (HOSPITAL ORDER, PERFORMED IN ~~LOC~~ HOSPITAL LAB): SARS Coronavirus 2: NEGATIVE

## 2019-12-11 LAB — LACTIC ACID, PLASMA: Lactic Acid, Venous: 1.6 mmol/L (ref 0.5–1.9)

## 2019-12-11 MED ORDER — DOXYCYCLINE HYCLATE 100 MG PO CAPS: 100.0000 mg | ORAL_CAPSULE | Freq: Two times a day (BID) | ORAL | 0 refills | Status: DC

## 2019-12-11 NOTE — Progress Notes (Signed)
Renal Navigator received secure message from ED PA/W. Ileene Patrick that patient is here being evaluated and has missed HD. He asked that patient be rescheduled for tomorrow.  Navigator contacted patient's OP HD clinic/[facility] who has given patient a seat time for tomorrow, 9/9, of 11:30am. She needs to arrive at 11:10am. Navigator spoke with SNF Coordinator ([facility])/T. Blakely to ensure that SNF will be able to transport to this appointment. She confirmed. She notes that patient's dressing has been changed daily since being admitted to SNF 1.5 weeks ago. Message passed along to EDP.  Alphonzo Cruise, [place] Renal Navigator [phone]

## 2019-12-11 NOTE — ED Notes (Signed)
Incision to left hip cleaned and wet to dry dressing applied.   Report called to pt family member [name] and [facility] in [place].

## 2019-12-11 NOTE — ED Triage Notes (Addendum)
Pt from [facility] s/p L hip surgery 1.5 weeks ago. Pt arrived at dialysis today (did not receive any of her tx) and they called EMS d/t bleeding from incision site. Per EMS dressing saturated in blood, still same dressing from the hospital, and still bleeding. Staples to site, tissue discolored and black.

## 2019-12-11 NOTE — Discharge Instructions (Addendum)
You have been seen here for wound reevaluation.  I have placed you on antibiotics please take as prescribed.  This antibiotics can make you more sensitive to the sun, please wear sunscreen and wear something to cover your face.  I want you to change dressings twice a day wet to dry keep the area clean.  I also have you scheduled t your dialysis treatment at [facility] tomorrow at 11:10 AM.  Your skilled nursing facility will arrange transport for you.  It is extremely important that you follow-up with your surgeon Dr.Lauffenburger, I have given you his contact information please call him today to schedule a follow-up appointment for this week.  I want to come back to emergency department if you develop severe chest pain, shortness of breath, severe abdominal pain, uncontrolled nausea, vomiting, diarrhea as the symptoms require further evaluation management.

## 2019-12-11 NOTE — ED Provider Notes (Signed)
[facility] EMERGENCY DEPARTMENT Provider Note   CSN: [id number] Arrival date & time: 12/11/19  1349     History No chief complaint on file.   [name] is a 79 y.o. female.  HPI    Patient with significant medical history of anemia, COPD, depression, end-stage renal disease on dialysis, GERD, diabetes, hypertension, presents emergency department with chief complaint of left hip pain and bleeding from surgical site.  Patient recently had a left hip Hemiarthorplasty done 2 weeks ago by Waynetta Sandy, MD. she is currently at a SNF where she is receiving physical therapy.  She went for her scheduled dialysis which is Monday, Wednesday, Friday.  When she went in today the dialysis team saw her wound and they stated it was foul-smelling, and her pants were soaked in blood.  She did not receive her full dialysis treatment.  She did received on Monday.  Patient explained that her left hip has been  hurting her more, she feels a burning sensation in her leg but is able to articulate without difficulty.  She denies fever, chills, shortness of breath, abdominal pain, nausea, vomiting, diarrhea.  No pedal edema.  Past Medical History:  Diagnosis Date  . Acute respiratory failure ([place]) 05/2017  . Anemia   . Anxiety   . Arthritis   . COPD (chronic obstructive pulmonary disease) ([place])   . Depression   . ESRD (end stage renal disease) ([place])    dialysis MWF  . GERD (gastroesophageal reflux disease)   . Gout   . History of blood transfusion   . History of diabetes mellitus    "diet controlled"  . HLD (hyperlipidemia)   . HOH (hard of hearing)    left ear  . Hypertension    hypotensive -since starting dialysis  . Hypothyroidism   . MRSA (methicillin resistant staph aureus) culture positive 06/01/2017  . PAF (paroxysmal atrial fibrillation) ([place])    a. Echo 11/16:  Mild LVH, EF 55-60%, normal wall motion, MAC, mild MR, severe LAE (49 ml/m2), mild RVE, normal RVSF,  mild RAE, mild TR, PASP 24 mmHg;  CHADS2-VASc: 4 >> Coumadin followed by PCP    Patient Active Problem List   Diagnosis Date Noted  . Educated about COVID-19 virus infection 08/06/2019  . Controlled substance agreement signed 05/16/2019  . Arthritis 02/14/2019  . Chronic midline thoracic back pain 02/14/2019  . Restless leg syndrome 10/26/2018  . Vitamin D deficiency 10/26/2018  . Age-related osteoporosis without current pathological fracture 10/26/2018  . Hypertension   . Gout   . COPD (chronic obstructive pulmonary disease) ([place])   . Anxiety   . 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 Adenomatous polyp of descending colon   . Adenomatous polyp of transverse colon   . Depression 08/19/2018  . Pernicious anemia 12/12/2017  . Moderate protein-calorie malnutrition ([place]) 06/02/2017  . Gastroesophageal reflux disease without esophagitis   . Dyslipidemia 02/09/2016  . Do not resuscitate 03/31/2015  . PAF (paroxysmal atrial fibrillation) ([place]) 02/04/2015  . Hypothyroidism   . Anemia in chronic kidney disease 12/09/2014  . Steal syndrome dialysis vascular access ([place]) 12/03/2014  . Secondary hyperparathyroidism of renal origin ([employer]) 11/17/2014  . Physical deconditioning 08/04/2013  . Chronic kidney disease due to type 2 diabetes mellitus ([place]) 07/26/2013  . ESRD (end stage renal disease) on dialysis ([place]) 03/15/2012    Past Surgical History:  Procedure Laterality Date  . AV FISTULA PLACEMENT  04/03/2012   Procedure: ARTERIOVENOUS (AV) FISTULA  CREATION;  Surgeon: Elam Dutch, MD;  Location: [facility];  Service: Vascular;  Laterality: Left;  creation left brachial cephalic fistula   . BIOPSY  08/21/2018   Procedure: BIOPSY;  Surgeon: Thornton Park, MD;  Location: [facility];  Service: Gastroenterology;;  . CARPAL TUNNEL RELEASE Bilateral   . COLONOSCOPY W/ POLYPECTOMY    . COLONOSCOPY WITH PROPOFOL N/A 08/21/2018   Procedure: COLONOSCOPY WITH PROPOFOL;  Surgeon:  Thornton Park, MD;  Location: [facility];  Service: Gastroenterology;  Laterality: N/A;  . DILATION AND CURETTAGE OF UTERUS    . ESOPHAGOGASTRODUODENOSCOPY (EGD) WITH PROPOFOL N/A 08/21/2018   Procedure: ESOPHAGOGASTRODUODENOSCOPY (EGD) WITH PROPOFOL;  Surgeon: Thornton Park, MD;  Location: [facility];  Service: Gastroenterology;  Laterality: N/A;  . EYE SURGERY Bilateral    bilateral cataract removal  . HEMATOMA EVACUATION Left 05/14/2018   Procedure: Incision and Drainage of Left Arm Hematoma;  Surgeon: Elam Dutch, MD;  Location: [facility] OR;  Service: Vascular;  Laterality: Left;  . Hemodialysis  catheter Right   . IR FLUORO GUIDE CV LINE RIGHT  05/26/2017  . IR REMOVAL TUN CV CATH W/O FL  05/24/2017  . IR US GUIDE VASC ACCESS RIGHT  05/26/2017  . LIGATION OF ARTERIOVENOUS  FISTULA Left 02/04/2015   Procedure: LIGATION OF BRACHIOCEPHALIC ARTERIOVENOUS  FISTULA;  Surgeon: Conrad Mauckport, MD;  Location: [facility];  Service: Vascular;  Laterality: Left;  Marland Kitchen MULTIPLE TOOTH EXTRACTIONS    . POLYPECTOMY  08/21/2018   Procedure: POLYPECTOMY;  Surgeon: Thornton Park, MD;  Location: [facility];  Service: Gastroenterology;;  . PORTACATH PLACEMENT    . REVERSE SHOULDER ARTHROPLASTY Left 01/22/2016   Procedure: LEFT REVERSE SHOULDER ARTHROPLASTY;  Surgeon: Netta Cedars, MD;  Location: [facility];  Service: Orthopedics;  Laterality: Left;  . SHOULDER ARTHROSCOPY Bilateral   . SPLIT NIGHT STUDY  07/26/2015  . STERIOD INJECTION Right 01/22/2016   Procedure: RIGHT RING FINGER STEROID INJECTION;  Surgeon: Netta Cedars, MD;  Location: [facility];  Service: Orthopedics;  Laterality: Right;  . TEE WITHOUT CARDIOVERSION N/A 05/26/2017   Procedure: TRANSESOPHAGEAL ECHOCARDIOGRAM (TEE);  Surgeon: Lelon Perla, MD;  Location: [facility];  Service: Cardiovascular;  Laterality: N/A;  . THROMBECTOMY BRACHIAL ARTERY Left 02/06/2015   Procedure: EVACUATION OF LEFT ARM HEMATOMA;  Surgeon: Angelia Mould, MD;   Location: [facility];  Service: Vascular;  Laterality: Left;  . TOTAL KNEE ARTHROPLASTY Bilateral   . TUBAL LIGATION       OB History    Gravida  0   Para  0   Term  0   Preterm  0   AB  0   Living        SAB  0   TAB  0   Ectopic  0   Multiple      Live Births              Family History  Problem Relation Age of Onset  . Arthritis Mother   . Diabetes Father        before age 59  . Heart disease Father   . Diabetes Sister   . Cancer Brother   . Hyperlipidemia Daughter   . Hypertension Daughter   . Diabetes Daughter   . Hypertension Son   . Hyperlipidemia Son   . Dementia Sister   . Cancer Sister        Unknown  . Other Sister        Died in car accident  .  Stroke Brother   . Diabetes Daughter   . Hypertension Daughter   . Hyperlipidemia Daughter   . Hepatitis C Son     Social History   Tobacco Use  . Smoking status: Former Smoker    Packs/day: 0.25    Years: 2.00    Pack years: 0.50    Types: Cigarettes    Quit date: 04/04/1998    Years since quitting: 21.7  . Smokeless tobacco: Never Used  Vaping Use  . Vaping Use: Never used  Substance Use Topics  . Alcohol use: No    Alcohol/week: 0.0 standard drinks  . Drug use: No    Home Medications Prior to Admission medications   Medication Sig Start Date End Date Taking? Authorizing Provider  acetaminophen (TYLENOL) 500 MG tablet Take 2 tablets (1,000 mg total) by mouth every 8 (eight) hours as needed for mild pain or headache. 11/02/18   [name], FNP  albuterol (VENTOLIN HFA) 108 (90 Base) MCG/ACT inhaler Inhale 2 puffs into the lungs every 6 (six) hours as needed for wheezing or shortness of breath. 05/16/19   [name], FNP  alendronate (FOSAMAX) 70 MG tablet Take 1 tablet (70 mg total) by mouth every Thursday. 09/05/19   [name], FNP  aspirin EC 81 MG tablet Take 81 mg by mouth daily.    [provider]  budesonide-formoterol (SYMBICORT) 80-4.5 MCG/ACT inhaler  Inhale 2 puffs into the lungs 2 (two) times daily. 10/30/18   [name], FNP  cinacalcet (SENSIPAR) 30 MG tablet Take 1 tablet (30 mg total) by mouth every Monday, Wednesday, and Friday. 05/28/17   Allie Bossier, MD  citalopram (CELEXA) 40 MG tablet TAKE 1 TABLET BY MOUTH EVERY DAY 06/10/19   [name], FNP  docusate calcium (SURFAK) 240 MG capsule Take 1 capsule (240 mg total) by mouth daily. 11/02/18   [name], FNP  doxercalciferol (HECTOROL) 4 MCG/2ML injection Inject 0.5 mLs (1 mcg total) into the vein every Monday, Wednesday, and Friday with hemodialysis. 05/29/17   Allie Bossier, MD  doxycycline (VIBRAMYCIN) 100 MG capsule Take 1 capsule (100 mg total) by mouth 2 (two) times daily. 12/11/19   Marcello Fennel, PA-C  ezetimibe (ZETIA) 10 MG tablet Take 1 tablet (10 mg total) by mouth daily. 10/26/18   [name], FNP  famotidine (PEPCID) 20 MG tablet TAKE 1 TABLET BY MOUTH TWICE A DAY 07/26/19   [name] F, FNP  HYDROcodone-acetaminophen (NORCO/VICODIN) 5-325 MG tablet Take 1 tablet by mouth every 6 (six) hours as needed for moderate pain. 09/04/19   [name], FNP  HYDROcodone-acetaminophen (NORCO/VICODIN) 5-325 MG tablet Take 1 tablet by mouth every 6 (six) hours as needed for moderate pain. 09/04/19   [name], FNP  HYDROcodone-acetaminophen (NORCO/VICODIN) 5-325 MG tablet Take 1 tablet by mouth every 6 (six) hours as needed for moderate pain. 09/04/19   [name], FNP  levothyroxine (SYNTHROID) 100 MCG tablet Take 1 tablet (100 mcg total) by mouth daily. 10/26/18   [name], FNP  Methoxy PEG-Epoetin Beta (MIRCERA IJ) Mircera 07/31/19 07/29/20  [provider]  Multiple Vitamin (MULTIVITAMIN) tablet Take 1 tablet by mouth daily.    [provider]  multivitamin (RENA-VIT) TABS tablet Take 1 tablet by mouth daily.    [provider]  nystatin cream (MYCOSTATIN) Apply 1 application topically 2 (two) times daily as  needed for dry skin. 10/26/18   [name],  FNP  OXYGEN Inhale 3 L into the lungs continuous.    [provider]  pantoprazole (PROTONIX) 20 MG tablet TAKE 1 TABLET BY MOUTH EVERY DAY 07/26/19   [name] F, FNP  pramipexole (MIRAPEX) 0.125 MG tablet TAKE 1 TABLET (0.125 MG TOTAL) BY MOUTH AT BEDTIME. 10/24/19   [name] F, FNP  Vitamin D, Ergocalciferol, (DRISDOL) 1.25 MG (50000 UT) CAPS capsule Take 1 capsule (50,000 Units total) by mouth every Monday. 10/29/18   [name], FNP    Allergies    Amoxicillin, Sulfa drugs cross reactors, Percocet [oxycodone-acetaminophen], and Sulfa antibiotics  Review of Systems   Review of Systems  Constitutional: Negative for chills and fever.  HENT: Negative for congestion, dental problem, drooling, tinnitus and trouble swallowing.   Eyes: Negative for visual disturbance.  Respiratory: Negative for cough and shortness of breath.   Cardiovascular: Negative for chest pain and leg swelling.  Gastrointestinal: Negative for abdominal pain, diarrhea, nausea and vomiting.  Genitourinary: Negative for enuresis, flank pain and frequency.  Musculoskeletal: Negative for back pain.       Patient complains of left hip pain.  Skin: Negative for rash.  Neurological: Negative for dizziness, light-headedness, numbness and headaches.  Hematological: Does not bruise/bleed easily.    Physical Exam Updated Vital Signs BP 114/65 (BP Location: Right Arm)   Pulse 74   Temp 98.6 F (37 C) (Oral)   Resp 14   SpO2 100%   Physical Exam Vitals and nursing note reviewed.  Constitutional:      General: She is not in acute distress.    Appearance: She is not ill-appearing.  HENT:     Head: Normocephalic and atraumatic.     Nose: No congestion.     Mouth/Throat:     Mouth: Mucous membranes are moist.     Pharynx: Oropharynx is clear.  Eyes:     General: No scleral icterus. Cardiovascular:     Rate and Rhythm: Normal rate and regular  rhythm.     Pulses: Normal pulses.     Heart sounds: No murmur heard.  No friction rub. No gallop.   Pulmonary:     Effort: No respiratory distress.     Breath sounds: No wheezing, rhonchi or rales.  Abdominal:     General: There is no distension.     Palpations: Abdomen is soft.     Tenderness: There is no abdominal tenderness. There is no right CVA tenderness, left CVA tenderness or guarding.  Musculoskeletal:        General: Tenderness and signs of injury present. No swelling.     Right lower leg: No edema.     Left lower leg: No edema.     Comments: Patient's left hip was visualized, there is a large surgical incision measuring 10 inches in length.  Area was necrotic with erythema surrounding the borders, slight bleeding noted coming from the wound.  It was Warm to the touchs tender to palpation, fluctuance felt without induration.    Skin:    General: Skin is warm and dry.     Findings: No rash.  Neurological:     Mental Status: She is alert.  Psychiatric:        Mood and Affect: Mood normal.       ED Results / Procedures / Treatments   Labs (all labs ordered are listed, but only abnormal results are displayed) Labs Reviewed  COMPREHENSIVE METABOLIC PANEL - Abnormal; Notable for the following components:  Result Value   Chloride 97 (*)    Glucose, Bld 102 (*)    BUN 54 (*)    Creatinine, Ser 6.36 (*)    Calcium 8.2 (*)    Total Protein 6.1 (*)    Albumin 2.8 (*)    GFR calc non Af Amer 6 (*)    GFR calc Af Amer 7 (*)    Anion gap 16 (*)    All other components within normal limits  CBC WITH DIFFERENTIAL/PLATELET - Abnormal; Notable for the following components:   WBC 12.8 (*)    RBC 3.12 (*)    Hemoglobin 9.5 (*)    HCT 31.4 (*)    MCV 100.6 (*)    RDW 16.4 (*)    Neutro Abs 9.5 (*)    Monocytes Absolute 1.4 (*)    Abs Immature Granulocytes 0.16 (*)    All other components within normal limits  SARS CORONAVIRUS 2 BY RT PCR (HOSPITAL ORDER, [facility])  AEROBIC CULTURE (SUPERFICIAL SPECIMEN)  CULTURE, BLOOD (ROUTINE X 2)  CULTURE, BLOOD (ROUTINE X 2)  LACTIC ACID, PLASMA  URINALYSIS, ROUTINE W REFLEX MICROSCOPIC    EKG None  Radiology No results found.  Procedures Procedures (including critical care time)  Medications Ordered in ED Medications - No data to display  ED Course  I have reviewed the triage vital signs and the nursing notes.  Pertinent labs & imaging results that were available during my care of the patient were reviewed by me and considered in my medical decision making (see chart for details).    MDM Rules/Calculators/A&P                          I have personally reviewed all imaging, labs and have interpreted them.  Patient presents with left hip pain and bleeding from her surgical site.  Patient was alert and oriented, did not appear in acute distress, vital signs reassuring.  On exam lung sounds were clear bilaterally, abdomen was soft nontender to palpation, left hip was visualized, there was a large surgical wound extending 10 inches in length, slightly bleeding was noted.  It was tender to palpation, there is necrotic skin surrounded by erythema.  Fluctuance felt no induration.  Will order screening labs and consult with surgeon for further recommendations.  CBC shows slight leukocytosis of 12.8, macrocytic anemia of 9.2 which appears at baseline for patient, CMP shows potassium at 4.8, hyperglycemia of 102, elevated bun 54, creatinine 6.3, hypoalbuminemia 2.8.  Lactic was 1.6.  due to concerns with postop wound will consult orthopedic surgeon for further recommendation and evaluation.  Unable to contact with the original orthopedic surgeon Dr. Nigel Bridgeman.  Spoke with Hilbert Odor PA-C of the orthopedic surgery team, he recommended trying to contact the original surgeon and having him follow up with her. If patient needs to be admitted [facility] team will consult on the  patient.  I have low suspicion for a systemic infection as patient was nontoxic-appearing, vital signs reassuring, lactate was 1.6, slight increase in white blood cells.  I suspect this more stress-induced as vital signs are reassuring but I cannot rule out infection will start patient on antibiotics.  Low suspicion for septic arthritis as patient denies hip pain, she can ambulate the hip without difficulty as well as walk on it.  I have low suspicion patient needs emergent hemodialysis as her potassium is 4.8, she is alert and oriented  is not encephalopathic, lung sounds are clear bilaterally no respiratory distress noted on exam.  I suspect elevated bun and creatinine as well as hypoalbumin is secondary to fluid overload and not having her dialysis treatment.Marland Kitchen  Spoke with case management who arranged her to have outpatient dialysis tomorrow at 11 AM and has arranged transportation for her.  Low suspicion for intra-abdominal abnormality as patient denies abdominal pain, tolerating p.o., no acute abdomen noted on exam.  I suspect patient may have a large hematoma underneath her surgical site as when it was palpated blood came out of wound there is no apparent discharge noted. it is possible she may have the beginning of a underlying infection. will start her on antibiotics and have her follow-up with her orthopedic surgeon Dr. Nigel Bridgeman  for further evaluation.  Patient is alert and oriented does not appear in acute distress, vital signs, reassuring does not meet criteria to be admitted to the hospital.  Likely patient has hematoma but will place her on antibiotics to cover for  infection.  recommend she  follows up with her surgeon Dr. Nigel Bridgeman   Patient was discussed with attending who evaluated patient agrees with assessment and plan.  Patient was given at home home care as well strict return precautions.  Patient verbalized that she understood and agreed to plan. Final Clinical Impression(s) / ED  Diagnoses Final diagnoses:  Encounter for post surgical wound check    Rx / DC Orders ED Discharge Orders         Ordered    doxycycline (VIBRAMYCIN) 100 MG capsule  2 times daily        12/11/19 1637           Aron Baba 12/11/19 1657    Virgel Manifold, MD 12/14/19 1547

## 2019-12-11 NOTE — ED Notes (Signed)
Called [facility] for transportation  

## 2019-12-12 DIAGNOSIS — D631 Anemia in chronic kidney disease: Secondary | ICD-10-CM | POA: Diagnosis not present

## 2019-12-12 DIAGNOSIS — Z992 Dependence on renal dialysis: Secondary | ICD-10-CM | POA: Diagnosis not present

## 2019-12-12 DIAGNOSIS — N186 End stage renal disease: Secondary | ICD-10-CM | POA: Diagnosis not present

## 2019-12-12 DIAGNOSIS — A499 Bacterial infection, unspecified: Secondary | ICD-10-CM | POA: Diagnosis not present

## 2019-12-12 DIAGNOSIS — D689 Coagulation defect, unspecified: Secondary | ICD-10-CM | POA: Diagnosis not present

## 2019-12-12 DIAGNOSIS — Z4901 Encounter for fitting and adjustment of extracorporeal dialysis catheter: Secondary | ICD-10-CM | POA: Diagnosis not present

## 2019-12-12 DIAGNOSIS — N2581 Secondary hyperparathyroidism of renal origin: Secondary | ICD-10-CM | POA: Diagnosis not present

## 2019-12-13 DIAGNOSIS — Z4901 Encounter for fitting and adjustment of extracorporeal dialysis catheter: Secondary | ICD-10-CM | POA: Diagnosis not present

## 2019-12-13 DIAGNOSIS — N2581 Secondary hyperparathyroidism of renal origin: Secondary | ICD-10-CM | POA: Diagnosis not present

## 2019-12-13 DIAGNOSIS — Z992 Dependence on renal dialysis: Secondary | ICD-10-CM | POA: Diagnosis not present

## 2019-12-13 DIAGNOSIS — D689 Coagulation defect, unspecified: Secondary | ICD-10-CM | POA: Diagnosis not present

## 2019-12-13 DIAGNOSIS — N186 End stage renal disease: Secondary | ICD-10-CM | POA: Diagnosis not present

## 2019-12-13 DIAGNOSIS — D631 Anemia in chronic kidney disease: Secondary | ICD-10-CM | POA: Diagnosis not present

## 2019-12-13 DIAGNOSIS — A499 Bacterial infection, unspecified: Secondary | ICD-10-CM | POA: Diagnosis not present

## 2019-12-14 LAB — AEROBIC CULTURE W GRAM STAIN (SUPERFICIAL SPECIMEN)

## 2019-12-16 DIAGNOSIS — Z4901 Encounter for fitting and adjustment of extracorporeal dialysis catheter: Secondary | ICD-10-CM | POA: Diagnosis not present

## 2019-12-16 DIAGNOSIS — N186 End stage renal disease: Secondary | ICD-10-CM | POA: Diagnosis not present

## 2019-12-16 DIAGNOSIS — A499 Bacterial infection, unspecified: Secondary | ICD-10-CM | POA: Diagnosis not present

## 2019-12-16 DIAGNOSIS — D689 Coagulation defect, unspecified: Secondary | ICD-10-CM | POA: Diagnosis not present

## 2019-12-16 DIAGNOSIS — Z992 Dependence on renal dialysis: Secondary | ICD-10-CM | POA: Diagnosis not present

## 2019-12-16 DIAGNOSIS — N2581 Secondary hyperparathyroidism of renal origin: Secondary | ICD-10-CM | POA: Diagnosis not present

## 2019-12-16 DIAGNOSIS — D631 Anemia in chronic kidney disease: Secondary | ICD-10-CM | POA: Diagnosis not present

## 2019-12-17 DIAGNOSIS — M25559 Pain in unspecified hip: Secondary | ICD-10-CM | POA: Diagnosis not present

## 2019-12-17 DIAGNOSIS — S72002A Fracture of unspecified part of neck of left femur, initial encounter for closed fracture: Secondary | ICD-10-CM | POA: Diagnosis not present

## 2019-12-18 DIAGNOSIS — I480 Paroxysmal atrial fibrillation: Secondary | ICD-10-CM | POA: Diagnosis not present

## 2019-12-18 DIAGNOSIS — D500 Iron deficiency anemia secondary to blood loss (chronic): Secondary | ICD-10-CM | POA: Diagnosis not present

## 2019-12-18 DIAGNOSIS — Z992 Dependence on renal dialysis: Secondary | ICD-10-CM | POA: Diagnosis not present

## 2019-12-18 DIAGNOSIS — N186 End stage renal disease: Secondary | ICD-10-CM | POA: Diagnosis not present

## 2019-12-18 DIAGNOSIS — A499 Bacterial infection, unspecified: Secondary | ICD-10-CM | POA: Diagnosis not present

## 2019-12-18 DIAGNOSIS — Z96642 Presence of left artificial hip joint: Secondary | ICD-10-CM | POA: Diagnosis not present

## 2019-12-18 DIAGNOSIS — J449 Chronic obstructive pulmonary disease, unspecified: Secondary | ICD-10-CM | POA: Diagnosis not present

## 2019-12-18 DIAGNOSIS — D631 Anemia in chronic kidney disease: Secondary | ICD-10-CM | POA: Diagnosis not present

## 2019-12-18 DIAGNOSIS — N2581 Secondary hyperparathyroidism of renal origin: Secondary | ICD-10-CM | POA: Diagnosis not present

## 2019-12-18 DIAGNOSIS — E1169 Type 2 diabetes mellitus with other specified complication: Secondary | ICD-10-CM | POA: Diagnosis not present

## 2019-12-18 DIAGNOSIS — E1122 Type 2 diabetes mellitus with diabetic chronic kidney disease: Secondary | ICD-10-CM | POA: Diagnosis not present

## 2019-12-18 DIAGNOSIS — S72002D Fracture of unspecified part of neck of left femur, subsequent encounter for closed fracture with routine healing: Secondary | ICD-10-CM | POA: Diagnosis not present

## 2019-12-18 DIAGNOSIS — D689 Coagulation defect, unspecified: Secondary | ICD-10-CM | POA: Diagnosis not present

## 2019-12-18 DIAGNOSIS — Z4901 Encounter for fitting and adjustment of extracorporeal dialysis catheter: Secondary | ICD-10-CM | POA: Diagnosis not present

## 2019-12-18 LAB — CULTURE, BLOOD (ROUTINE X 2)
Culture: NO GROWTH
Culture: NO GROWTH
Special Requests: ADEQUATE

## 2019-12-19 DIAGNOSIS — I10 Essential (primary) hypertension: Secondary | ICD-10-CM | POA: Diagnosis not present

## 2019-12-19 DIAGNOSIS — A498 Other bacterial infections of unspecified site: Secondary | ICD-10-CM | POA: Diagnosis not present

## 2019-12-19 DIAGNOSIS — U071 COVID-19: Secondary | ICD-10-CM | POA: Diagnosis not present

## 2019-12-19 DIAGNOSIS — E119 Type 2 diabetes mellitus without complications: Secondary | ICD-10-CM | POA: Diagnosis not present

## 2019-12-19 DIAGNOSIS — E039 Hypothyroidism, unspecified: Secondary | ICD-10-CM | POA: Diagnosis not present

## 2019-12-19 DIAGNOSIS — Z79899 Other long term (current) drug therapy: Secondary | ICD-10-CM | POA: Diagnosis not present

## 2019-12-19 DIAGNOSIS — R0989 Other specified symptoms and signs involving the circulatory and respiratory systems: Secondary | ICD-10-CM | POA: Diagnosis not present

## 2019-12-19 DIAGNOSIS — T8130XA Disruption of wound, unspecified, initial encounter: Secondary | ICD-10-CM | POA: Diagnosis not present

## 2019-12-19 DIAGNOSIS — J9601 Acute respiratory failure with hypoxia: Secondary | ICD-10-CM | POA: Diagnosis not present

## 2019-12-19 DIAGNOSIS — E1165 Type 2 diabetes mellitus with hyperglycemia: Secondary | ICD-10-CM | POA: Diagnosis not present

## 2019-12-19 DIAGNOSIS — T8452XA Infection and inflammatory reaction due to internal left hip prosthesis, initial encounter: Secondary | ICD-10-CM | POA: Diagnosis not present

## 2019-12-19 DIAGNOSIS — J449 Chronic obstructive pulmonary disease, unspecified: Secondary | ICD-10-CM | POA: Diagnosis not present

## 2019-12-19 DIAGNOSIS — Z96642 Presence of left artificial hip joint: Secondary | ICD-10-CM | POA: Diagnosis not present

## 2019-12-19 DIAGNOSIS — Z992 Dependence on renal dialysis: Secondary | ICD-10-CM | POA: Diagnosis not present

## 2019-12-19 DIAGNOSIS — J984 Other disorders of lung: Secondary | ICD-10-CM | POA: Diagnosis not present

## 2019-12-19 DIAGNOSIS — A419 Sepsis, unspecified organism: Secondary | ICD-10-CM | POA: Diagnosis not present

## 2019-12-19 DIAGNOSIS — Z452 Encounter for adjustment and management of vascular access device: Secondary | ICD-10-CM | POA: Diagnosis not present

## 2019-12-19 DIAGNOSIS — M25552 Pain in left hip: Secondary | ICD-10-CM | POA: Diagnosis not present

## 2019-12-19 DIAGNOSIS — I130 Hypertensive heart and chronic kidney disease with heart failure and stage 1 through stage 4 chronic kidney disease, or unspecified chronic kidney disease: Secondary | ICD-10-CM | POA: Diagnosis not present

## 2019-12-19 DIAGNOSIS — R6521 Severe sepsis with septic shock: Secondary | ICD-10-CM | POA: Diagnosis not present

## 2019-12-19 DIAGNOSIS — S71002S Unspecified open wound, left hip, sequela: Secondary | ICD-10-CM | POA: Diagnosis not present

## 2019-12-19 DIAGNOSIS — I5022 Chronic systolic (congestive) heart failure: Secondary | ICD-10-CM | POA: Diagnosis not present

## 2019-12-19 DIAGNOSIS — I517 Cardiomegaly: Secondary | ICD-10-CM | POA: Diagnosis not present

## 2019-12-19 DIAGNOSIS — I120 Hypertensive chronic kidney disease with stage 5 chronic kidney disease or end stage renal disease: Secondary | ICD-10-CM | POA: Diagnosis not present

## 2019-12-19 DIAGNOSIS — Z87891 Personal history of nicotine dependence: Secondary | ICD-10-CM | POA: Diagnosis not present

## 2019-12-19 DIAGNOSIS — S71002A Unspecified open wound, left hip, initial encounter: Secondary | ICD-10-CM | POA: Diagnosis not present

## 2019-12-19 DIAGNOSIS — N186 End stage renal disease: Secondary | ICD-10-CM | POA: Diagnosis not present

## 2019-12-19 DIAGNOSIS — T8141XA Infection following a procedure, superficial incisional surgical site, initial encounter: Secondary | ICD-10-CM | POA: Diagnosis not present

## 2019-12-19 DIAGNOSIS — S71012A Laceration without foreign body, left hip, initial encounter: Secondary | ICD-10-CM | POA: Diagnosis not present

## 2019-12-19 DIAGNOSIS — Z7983 Long term (current) use of bisphosphonates: Secondary | ICD-10-CM | POA: Diagnosis not present

## 2019-12-19 DIAGNOSIS — Z7901 Long term (current) use of anticoagulants: Secondary | ICD-10-CM | POA: Diagnosis not present

## 2019-12-19 DIAGNOSIS — T8459XA Infection and inflammatory reaction due to other internal joint prosthesis, initial encounter: Secondary | ICD-10-CM | POA: Diagnosis not present

## 2019-12-19 DIAGNOSIS — Z7982 Long term (current) use of aspirin: Secondary | ICD-10-CM | POA: Diagnosis not present

## 2019-12-19 DIAGNOSIS — T8131XA Disruption of external operation (surgical) wound, not elsewhere classified, initial encounter: Secondary | ICD-10-CM | POA: Diagnosis not present

## 2019-12-19 DIAGNOSIS — E1122 Type 2 diabetes mellitus with diabetic chronic kidney disease: Secondary | ICD-10-CM | POA: Diagnosis not present

## 2019-12-19 DIAGNOSIS — G2581 Restless legs syndrome: Secondary | ICD-10-CM | POA: Diagnosis not present

## 2019-12-19 DIAGNOSIS — I480 Paroxysmal atrial fibrillation: Secondary | ICD-10-CM | POA: Diagnosis not present

## 2019-12-19 DIAGNOSIS — T8149XA Infection following a procedure, other surgical site, initial encounter: Secondary | ICD-10-CM | POA: Diagnosis not present

## 2019-12-19 DIAGNOSIS — E559 Vitamin D deficiency, unspecified: Secondary | ICD-10-CM | POA: Diagnosis not present

## 2019-12-19 DIAGNOSIS — K219 Gastro-esophageal reflux disease without esophagitis: Secondary | ICD-10-CM | POA: Diagnosis not present

## 2019-12-20 DIAGNOSIS — K219 Gastro-esophageal reflux disease without esophagitis: Secondary | ICD-10-CM | POA: Diagnosis not present

## 2019-12-20 DIAGNOSIS — S71002S Unspecified open wound, left hip, sequela: Secondary | ICD-10-CM | POA: Diagnosis not present

## 2019-12-20 DIAGNOSIS — J9601 Acute respiratory failure with hypoxia: Secondary | ICD-10-CM | POA: Diagnosis not present

## 2019-12-20 DIAGNOSIS — I517 Cardiomegaly: Secondary | ICD-10-CM | POA: Diagnosis not present

## 2019-12-20 DIAGNOSIS — J449 Chronic obstructive pulmonary disease, unspecified: Secondary | ICD-10-CM | POA: Diagnosis not present

## 2019-12-20 DIAGNOSIS — T8459XA Infection and inflammatory reaction due to other internal joint prosthesis, initial encounter: Secondary | ICD-10-CM | POA: Diagnosis not present

## 2019-12-20 DIAGNOSIS — N186 End stage renal disease: Secondary | ICD-10-CM | POA: Diagnosis not present

## 2019-12-20 DIAGNOSIS — Z452 Encounter for adjustment and management of vascular access device: Secondary | ICD-10-CM | POA: Diagnosis not present

## 2019-12-20 DIAGNOSIS — S71002A Unspecified open wound, left hip, initial encounter: Secondary | ICD-10-CM | POA: Diagnosis not present

## 2019-12-20 DIAGNOSIS — Z992 Dependence on renal dialysis: Secondary | ICD-10-CM | POA: Diagnosis not present

## 2019-12-20 DIAGNOSIS — E039 Hypothyroidism, unspecified: Secondary | ICD-10-CM | POA: Diagnosis not present

## 2019-12-20 DIAGNOSIS — A419 Sepsis, unspecified organism: Secondary | ICD-10-CM | POA: Diagnosis not present

## 2019-12-20 DIAGNOSIS — R0989 Other specified symptoms and signs involving the circulatory and respiratory systems: Secondary | ICD-10-CM | POA: Diagnosis not present

## 2019-12-20 DIAGNOSIS — J984 Other disorders of lung: Secondary | ICD-10-CM | POA: Diagnosis not present

## 2019-12-21 DIAGNOSIS — T8452XA Infection and inflammatory reaction due to internal left hip prosthesis, initial encounter: Secondary | ICD-10-CM | POA: Diagnosis not present

## 2019-12-21 DIAGNOSIS — T8459XA Infection and inflammatory reaction due to other internal joint prosthesis, initial encounter: Secondary | ICD-10-CM | POA: Diagnosis not present

## 2019-12-21 DIAGNOSIS — A419 Sepsis, unspecified organism: Secondary | ICD-10-CM | POA: Diagnosis not present

## 2019-12-21 DIAGNOSIS — N186 End stage renal disease: Secondary | ICD-10-CM | POA: Diagnosis not present

## 2019-12-21 DIAGNOSIS — T8141XA Infection following a procedure, superficial incisional surgical site, initial encounter: Secondary | ICD-10-CM | POA: Diagnosis not present

## 2019-12-21 DIAGNOSIS — J9601 Acute respiratory failure with hypoxia: Secondary | ICD-10-CM | POA: Diagnosis not present

## 2019-12-22 DIAGNOSIS — Z992 Dependence on renal dialysis: Secondary | ICD-10-CM | POA: Diagnosis not present

## 2019-12-22 DIAGNOSIS — T8459XA Infection and inflammatory reaction due to other internal joint prosthesis, initial encounter: Secondary | ICD-10-CM | POA: Diagnosis not present

## 2019-12-22 DIAGNOSIS — A419 Sepsis, unspecified organism: Secondary | ICD-10-CM | POA: Diagnosis not present

## 2019-12-22 DIAGNOSIS — J9601 Acute respiratory failure with hypoxia: Secondary | ICD-10-CM | POA: Diagnosis not present

## 2019-12-22 DIAGNOSIS — S71002S Unspecified open wound, left hip, sequela: Secondary | ICD-10-CM | POA: Diagnosis not present

## 2019-12-22 DIAGNOSIS — N186 End stage renal disease: Secondary | ICD-10-CM | POA: Diagnosis not present

## 2019-12-23 DIAGNOSIS — N186 End stage renal disease: Secondary | ICD-10-CM | POA: Diagnosis not present

## 2019-12-23 DIAGNOSIS — T8459XA Infection and inflammatory reaction due to other internal joint prosthesis, initial encounter: Secondary | ICD-10-CM | POA: Diagnosis not present

## 2019-12-23 DIAGNOSIS — S71002S Unspecified open wound, left hip, sequela: Secondary | ICD-10-CM | POA: Diagnosis not present

## 2019-12-23 DIAGNOSIS — Z992 Dependence on renal dialysis: Secondary | ICD-10-CM | POA: Diagnosis not present

## 2019-12-23 DIAGNOSIS — A498 Other bacterial infections of unspecified site: Secondary | ICD-10-CM | POA: Diagnosis not present

## 2019-12-24 DIAGNOSIS — Z992 Dependence on renal dialysis: Secondary | ICD-10-CM | POA: Diagnosis not present

## 2019-12-24 DIAGNOSIS — N186 End stage renal disease: Secondary | ICD-10-CM | POA: Diagnosis not present

## 2019-12-24 DIAGNOSIS — A498 Other bacterial infections of unspecified site: Secondary | ICD-10-CM | POA: Diagnosis not present

## 2019-12-24 DIAGNOSIS — T8459XA Infection and inflammatory reaction due to other internal joint prosthesis, initial encounter: Secondary | ICD-10-CM | POA: Diagnosis not present

## 2019-12-25 DIAGNOSIS — Z992 Dependence on renal dialysis: Secondary | ICD-10-CM | POA: Diagnosis not present

## 2019-12-25 DIAGNOSIS — T8459XA Infection and inflammatory reaction due to other internal joint prosthesis, initial encounter: Secondary | ICD-10-CM | POA: Diagnosis not present

## 2019-12-25 DIAGNOSIS — S71002S Unspecified open wound, left hip, sequela: Secondary | ICD-10-CM | POA: Diagnosis not present

## 2019-12-25 DIAGNOSIS — N186 End stage renal disease: Secondary | ICD-10-CM | POA: Diagnosis not present

## 2019-12-25 DIAGNOSIS — A498 Other bacterial infections of unspecified site: Secondary | ICD-10-CM | POA: Diagnosis not present

## 2019-12-26 DIAGNOSIS — A498 Other bacterial infections of unspecified site: Secondary | ICD-10-CM | POA: Diagnosis not present

## 2019-12-26 DIAGNOSIS — Z992 Dependence on renal dialysis: Secondary | ICD-10-CM | POA: Diagnosis not present

## 2019-12-26 DIAGNOSIS — N186 End stage renal disease: Secondary | ICD-10-CM | POA: Diagnosis not present

## 2019-12-26 DIAGNOSIS — T8459XA Infection and inflammatory reaction due to other internal joint prosthesis, initial encounter: Secondary | ICD-10-CM | POA: Diagnosis not present

## 2019-12-27 DIAGNOSIS — A498 Other bacterial infections of unspecified site: Secondary | ICD-10-CM | POA: Diagnosis not present

## 2019-12-27 DIAGNOSIS — Z992 Dependence on renal dialysis: Secondary | ICD-10-CM | POA: Diagnosis not present

## 2019-12-27 DIAGNOSIS — T8131XA Disruption of external operation (surgical) wound, not elsewhere classified, initial encounter: Secondary | ICD-10-CM | POA: Diagnosis not present

## 2019-12-27 DIAGNOSIS — T8459XA Infection and inflammatory reaction due to other internal joint prosthesis, initial encounter: Secondary | ICD-10-CM | POA: Diagnosis not present

## 2019-12-27 DIAGNOSIS — N186 End stage renal disease: Secondary | ICD-10-CM | POA: Diagnosis not present

## 2019-12-28 DIAGNOSIS — T8452XA Infection and inflammatory reaction due to internal left hip prosthesis, initial encounter: Secondary | ICD-10-CM | POA: Diagnosis not present

## 2019-12-29 DIAGNOSIS — I480 Paroxysmal atrial fibrillation: Secondary | ICD-10-CM | POA: Diagnosis not present

## 2019-12-29 DIAGNOSIS — S71002A Unspecified open wound, left hip, initial encounter: Secondary | ICD-10-CM | POA: Diagnosis not present

## 2019-12-30 DIAGNOSIS — S71002A Unspecified open wound, left hip, initial encounter: Secondary | ICD-10-CM | POA: Diagnosis not present

## 2019-12-30 DIAGNOSIS — Z992 Dependence on renal dialysis: Secondary | ICD-10-CM | POA: Diagnosis not present

## 2019-12-30 DIAGNOSIS — N186 End stage renal disease: Secondary | ICD-10-CM | POA: Diagnosis not present

## 2019-12-30 DIAGNOSIS — S71002S Unspecified open wound, left hip, sequela: Secondary | ICD-10-CM | POA: Diagnosis not present

## 2020-01-01 ENCOUNTER — Telehealth: Payer: Self-pay | Admitting: Family Medicine

## 2020-01-01 DIAGNOSIS — N186 End stage renal disease: Secondary | ICD-10-CM | POA: Diagnosis not present

## 2020-01-01 DIAGNOSIS — N2581 Secondary hyperparathyroidism of renal origin: Secondary | ICD-10-CM | POA: Diagnosis not present

## 2020-01-01 DIAGNOSIS — D631 Anemia in chronic kidney disease: Secondary | ICD-10-CM | POA: Diagnosis not present

## 2020-01-01 DIAGNOSIS — D689 Coagulation defect, unspecified: Secondary | ICD-10-CM | POA: Diagnosis not present

## 2020-01-01 DIAGNOSIS — A499 Bacterial infection, unspecified: Secondary | ICD-10-CM | POA: Diagnosis not present

## 2020-01-01 DIAGNOSIS — Z4901 Encounter for fitting and adjustment of extracorporeal dialysis catheter: Secondary | ICD-10-CM | POA: Diagnosis not present

## 2020-01-01 DIAGNOSIS — Z992 Dependence on renal dialysis: Secondary | ICD-10-CM | POA: Diagnosis not present

## 2020-01-01 NOTE — Telephone Encounter (Signed)
TRANSITIONAL CARE MANAGEMENT TELEPHONE OUTREACH NOTE   Contact Date: 01/01/2020 Contacted By: [place] INFORMATION Date of Discharge:12/31/2019 Discharge Facility: [facility] Principal Discharge Diagnosis:surgical wound    Outpatient Follow Up Recommendations (copied from discharge summary) Waiting for notes to be faxed   [name] is a female primary care patient of [name], FNP. An outgoing telephone call was made today and I spoke with Michelle in-law).  [name] condition(s) and treatment(s) were discussed. An opportunity to ask questions was provided and all were answered or forwarded as appropriate.    ACTIVITIES OF DAILY LIVING  [name] lives with their family and she cannot perform ADLs independently. her primary caregiver is [name]. she is able to depend on her primary caregiver(s) for consistent help. Transportation to appointments, to pick up medications, and to run errands is not a problem.  (Consider referral to [facility] if transportation or a consistent caregiver is a problem)   Fall Risk Fall Risk  09/04/2019 08/06/2019  Falls in the past year? 0 0  Number falls in past yr: - -  Injury with Fall? - -    high [place] Modifications/Assistive Devices Wheelchair: Yes Cane: No Ramp: Yes Bedside Toilet: Yes Hospital Bed:  Yes Other:    [place] she is not receiving home health    MEDICATION RECONCILIATION  [name] has been able to pick-up all prescribed discharge medications from the pharmacy.   A post discharge medication reconciliation was performed and the complete medication list was reviewed with the patient/caregiver and is current as of 01/01/2020. Changes highlighted below.  Discontinued Medications   Current Medication List Allergies as of 01/01/2020      Reactions   Amoxicillin Rash   Has patient had a PCN reaction causing immediate rash, facial/tongue/throat  swelling, SOB or lightheadedness with hypotension:YES Has patient had a PCN reaction causing severe rash involving mucus membranes or skin necrosis: Yes Has patient had a PCN reaction that required hospitalization No Has patient had a PCN reaction occurring within the last 10 years: Yes If all of the above answers are "NO", then may proceed with Cephalosporin use.   Sulfa Drugs [name] Reactors Hives, Itching   Percocet [oxycodone-acetaminophen] Itching, Rash   Did not happen last time she took it   Sulfa Antibiotics Hives      Medication List       Accurate as of January 01, 2020  9:36 AM. If you have any questions, ask your nurse or doctor.        acetaminophen 500 MG tablet Commonly known as: TYLENOL Take 2 tablets (1,000 mg total) by mouth every 8 (eight) hours as needed for mild pain or headache.   albuterol 108 (90 Base) MCG/ACT inhaler Commonly known as: VENTOLIN HFA Inhale 2 puffs into the lungs every 6 (six) hours as needed for wheezing or shortness of breath.   alendronate 70 MG tablet Commonly known as: FOSAMAX Take 1 tablet (70 mg total) by mouth every Thursday.   aspirin EC 81 MG tablet Take 81 mg by mouth daily.   budesonide-formoterol 80-4.5 MCG/ACT inhaler Commonly known as: Symbicort Inhale 2 puffs into the lungs 2 (two) times daily.   cinacalcet 30 MG tablet Commonly known as: SENSIPAR Take 1 tablet (30 mg total) by mouth every Monday, Wednesday, and Friday.   citalopram 40 MG tablet Commonly known as: CELEXA TAKE 1 TABLET BY MOUTH EVERY DAY  docusate calcium 240 MG capsule Commonly known as: SURFAK Take 1 capsule (240 mg total) by mouth daily.   doxercalciferol 4 MCG/2ML injection Commonly known as: HECTOROL Inject 0.5 mLs (1 mcg total) into the vein every Monday, Wednesday, and Friday with hemodialysis.   doxycycline 100 MG capsule Commonly known as: VIBRAMYCIN Take 1 capsule (100 mg total) by mouth 2 (two) times daily.   ezetimibe 10 MG  tablet Commonly known as: ZETIA Take 1 tablet (10 mg total) by mouth daily.   famotidine 20 MG tablet Commonly known as: PEPCID TAKE 1 TABLET BY MOUTH TWICE A DAY   HYDROcodone-acetaminophen 5-325 MG tablet Commonly known as: NORCO/VICODIN Take 1 tablet by mouth every 6 (six) hours as needed for moderate pain.   HYDROcodone-acetaminophen 5-325 MG tablet Commonly known as: NORCO/VICODIN Take 1 tablet by mouth every 6 (six) hours as needed for moderate pain.   HYDROcodone-acetaminophen 5-325 MG tablet Commonly known as: NORCO/VICODIN Take 1 tablet by mouth every 6 (six) hours as needed for moderate pain.   levothyroxine 100 MCG tablet Commonly known as: SYNTHROID Take 1 tablet (100 mcg total) by mouth daily.   MIRCERA IJ Mircera   multivitamin tablet Take 1 tablet by mouth daily.   multivitamin Tabs tablet Take 1 tablet by mouth daily.   nystatin cream Commonly known as: MYCOSTATIN Apply 1 application topically 2 (two) times daily as needed for dry skin.   OXYGEN Inhale 3 L into the lungs continuous.   pantoprazole 20 MG tablet Commonly known as: PROTONIX TAKE 1 TABLET BY MOUTH EVERY DAY   pramipexole 0.125 MG tablet Commonly known as: MIRAPEX TAKE 1 TABLET (0.125 MG TOTAL) BY MOUTH AT BEDTIME.   Vitamin D (Ergocalciferol) 1.25 MG (50000 UNIT) Caps capsule Commonly known as: DRISDOL Take 1 capsule (50,000 Units total) by mouth every Monday.        PATIENT EDUCATION & FOLLOW-UP PLAN  An appointment for Transitional Care Management is scheduled with [name], FNP on 01/10/2020 at 1:35.  Take all medications as prescribed  Contact our office by calling [phone] if you have any questions or concerns

## 2020-01-02 DIAGNOSIS — E1122 Type 2 diabetes mellitus with diabetic chronic kidney disease: Secondary | ICD-10-CM | POA: Diagnosis not present

## 2020-01-02 DIAGNOSIS — N186 End stage renal disease: Secondary | ICD-10-CM | POA: Diagnosis not present

## 2020-01-02 DIAGNOSIS — Z992 Dependence on renal dialysis: Secondary | ICD-10-CM | POA: Diagnosis not present

## 2020-01-03 DIAGNOSIS — E1121 Type 2 diabetes mellitus with diabetic nephropathy: Secondary | ICD-10-CM | POA: Diagnosis not present

## 2020-01-03 DIAGNOSIS — D631 Anemia in chronic kidney disease: Secondary | ICD-10-CM | POA: Diagnosis not present

## 2020-01-03 DIAGNOSIS — D689 Coagulation defect, unspecified: Secondary | ICD-10-CM | POA: Diagnosis not present

## 2020-01-03 DIAGNOSIS — Z4901 Encounter for fitting and adjustment of extracorporeal dialysis catheter: Secondary | ICD-10-CM | POA: Diagnosis not present

## 2020-01-03 DIAGNOSIS — N186 End stage renal disease: Secondary | ICD-10-CM | POA: Diagnosis not present

## 2020-01-03 DIAGNOSIS — A499 Bacterial infection, unspecified: Secondary | ICD-10-CM | POA: Diagnosis not present

## 2020-01-03 DIAGNOSIS — N185 Chronic kidney disease, stage 5: Secondary | ICD-10-CM | POA: Diagnosis not present

## 2020-01-03 DIAGNOSIS — N2581 Secondary hyperparathyroidism of renal origin: Secondary | ICD-10-CM | POA: Diagnosis not present

## 2020-01-03 DIAGNOSIS — Z992 Dependence on renal dialysis: Secondary | ICD-10-CM | POA: Diagnosis not present

## 2020-01-04 ENCOUNTER — Other Ambulatory Visit: Payer: Self-pay | Admitting: Family Medicine

## 2020-01-04 DIAGNOSIS — K219 Gastro-esophageal reflux disease without esophagitis: Secondary | ICD-10-CM

## 2020-01-06 DIAGNOSIS — D689 Coagulation defect, unspecified: Secondary | ICD-10-CM | POA: Diagnosis not present

## 2020-01-06 DIAGNOSIS — D631 Anemia in chronic kidney disease: Secondary | ICD-10-CM | POA: Diagnosis not present

## 2020-01-06 DIAGNOSIS — E1121 Type 2 diabetes mellitus with diabetic nephropathy: Secondary | ICD-10-CM | POA: Diagnosis not present

## 2020-01-06 DIAGNOSIS — Z992 Dependence on renal dialysis: Secondary | ICD-10-CM | POA: Diagnosis not present

## 2020-01-06 DIAGNOSIS — U071 COVID-19: Secondary | ICD-10-CM | POA: Diagnosis not present

## 2020-01-06 DIAGNOSIS — Z4901 Encounter for fitting and adjustment of extracorporeal dialysis catheter: Secondary | ICD-10-CM | POA: Diagnosis not present

## 2020-01-06 DIAGNOSIS — A499 Bacterial infection, unspecified: Secondary | ICD-10-CM | POA: Diagnosis not present

## 2020-01-06 DIAGNOSIS — N186 End stage renal disease: Secondary | ICD-10-CM | POA: Diagnosis not present

## 2020-01-06 DIAGNOSIS — N185 Chronic kidney disease, stage 5: Secondary | ICD-10-CM | POA: Diagnosis not present

## 2020-01-06 DIAGNOSIS — N2581 Secondary hyperparathyroidism of renal origin: Secondary | ICD-10-CM | POA: Diagnosis not present

## 2020-01-08 DIAGNOSIS — N186 End stage renal disease: Secondary | ICD-10-CM | POA: Diagnosis not present

## 2020-01-08 DIAGNOSIS — Z4901 Encounter for fitting and adjustment of extracorporeal dialysis catheter: Secondary | ICD-10-CM | POA: Diagnosis not present

## 2020-01-08 DIAGNOSIS — N2581 Secondary hyperparathyroidism of renal origin: Secondary | ICD-10-CM | POA: Diagnosis not present

## 2020-01-08 DIAGNOSIS — D631 Anemia in chronic kidney disease: Secondary | ICD-10-CM | POA: Diagnosis not present

## 2020-01-08 DIAGNOSIS — D689 Coagulation defect, unspecified: Secondary | ICD-10-CM | POA: Diagnosis not present

## 2020-01-08 DIAGNOSIS — N185 Chronic kidney disease, stage 5: Secondary | ICD-10-CM | POA: Diagnosis not present

## 2020-01-08 DIAGNOSIS — Z992 Dependence on renal dialysis: Secondary | ICD-10-CM | POA: Diagnosis not present

## 2020-01-08 DIAGNOSIS — E1121 Type 2 diabetes mellitus with diabetic nephropathy: Secondary | ICD-10-CM | POA: Diagnosis not present

## 2020-01-08 DIAGNOSIS — A499 Bacterial infection, unspecified: Secondary | ICD-10-CM | POA: Diagnosis not present

## 2020-01-10 ENCOUNTER — Telehealth (INDEPENDENT_AMBULATORY_CARE_PROVIDER_SITE_OTHER): Payer: Medicare Other | Admitting: Family Medicine

## 2020-01-10 DIAGNOSIS — E559 Vitamin D deficiency, unspecified: Secondary | ICD-10-CM

## 2020-01-10 DIAGNOSIS — N185 Chronic kidney disease, stage 5: Secondary | ICD-10-CM | POA: Diagnosis not present

## 2020-01-10 DIAGNOSIS — Z96642 Presence of left artificial hip joint: Secondary | ICD-10-CM | POA: Diagnosis not present

## 2020-01-10 DIAGNOSIS — A499 Bacterial infection, unspecified: Secondary | ICD-10-CM | POA: Diagnosis not present

## 2020-01-10 DIAGNOSIS — D631 Anemia in chronic kidney disease: Secondary | ICD-10-CM | POA: Diagnosis not present

## 2020-01-10 DIAGNOSIS — D689 Coagulation defect, unspecified: Secondary | ICD-10-CM | POA: Diagnosis not present

## 2020-01-10 DIAGNOSIS — N2581 Secondary hyperparathyroidism of renal origin: Secondary | ICD-10-CM | POA: Diagnosis not present

## 2020-01-10 DIAGNOSIS — E1121 Type 2 diabetes mellitus with diabetic nephropathy: Secondary | ICD-10-CM | POA: Diagnosis not present

## 2020-01-10 DIAGNOSIS — M810 Age-related osteoporosis without current pathological fracture: Secondary | ICD-10-CM

## 2020-01-10 DIAGNOSIS — Z4901 Encounter for fitting and adjustment of extracorporeal dialysis catheter: Secondary | ICD-10-CM | POA: Diagnosis not present

## 2020-01-10 DIAGNOSIS — N186 End stage renal disease: Secondary | ICD-10-CM | POA: Diagnosis not present

## 2020-01-10 DIAGNOSIS — Z992 Dependence on renal dialysis: Secondary | ICD-10-CM | POA: Diagnosis not present

## 2020-01-10 NOTE — Progress Notes (Signed)
Virtual Visit via Video note  I connected with [name] on 01/10/20 at 1:36 PM by video and verified that I am speaking with the correct person using two identifiers. [name] is currently located at home and her daughter is currently with her during visit. The provider, [name], FNP is located in their office at time of visit.  I discussed the limitations, risks, security and privacy concerns of performing an evaluation and management service by video and the availability of in person appointments. I also discussed with the patient that there may be a patient responsible charge related to this service. The patient expressed understanding and agreed to proceed.  Subjective: PCP: [name], FNP  Chief Complaint  Patient presents with  . Hospitalization Follow-up   Patient was admitted to [facility] from 12/19/2019 - 12/31/2019 due to wound dehiscence and infection after left hip hemiarthroplasty.  She was also found to be COVID-19 positive at the time of her admission.  Patient had ID of left hip wound with primary wound closure on 12/21/2019.  Patient was able to participate with physical therapy, ambulating weightbearing as tolerated.  At discharge patient was advised to continue with physical therapy, perform daily dressing changes, and use heparin 3 times daily x2 weeks.  When heparin is completed she was to take aspirin 81 mg once daily x4 weeks.  Patient reports today she has not had PT at home as nobody ever came out. She is using her walking to get around. She has been following with the orthopedic surgeon. She is using Tylenol for pain control.    ROS: Per HPI  Current Outpatient Medications:  .  acetaminophen (TYLENOL) 500 MG tablet, Take 2 tablets (1,000 mg total) by mouth every 8 (eight) hours as needed for mild pain or headache., Disp: 30 tablet, Rfl: 0 .  albuterol (VENTOLIN HFA) 108 (90 Base) MCG/ACT inhaler, Inhale 2 puffs into the lungs every 6 (six) hours as  needed for wheezing or shortness of breath., Disp: 18 g, Rfl: 2 .  alendronate (FOSAMAX) 70 MG tablet, Take 1 tablet (70 mg total) by mouth every Thursday., Disp: 12 tablet, Rfl: 3 .  aspirin EC 81 MG tablet, Take 81 mg by mouth daily., Disp: , Rfl:  .  budesonide-formoterol (SYMBICORT) 80-4.5 MCG/ACT inhaler, Inhale 2 puffs into the lungs 2 (two) times daily., Disp: 3 Inhaler, Rfl: 4 .  cinacalcet (SENSIPAR) 30 MG tablet, Take 1 tablet (30 mg total) by mouth every Monday, Wednesday, and Friday., Disp: 60 tablet, Rfl: 0 .  citalopram (CELEXA) 40 MG tablet, TAKE 1 TABLET BY MOUTH EVERY DAY, Disp: 90 tablet, Rfl: 0 .  docusate calcium (SURFAK) 240 MG capsule, Take 1 capsule (240 mg total) by mouth daily., Disp: 90 capsule, Rfl: 1 .  doxercalciferol (HECTOROL) 4 MCG/2ML injection, Inject 0.5 mLs (1 mcg total) into the vein every Monday, Wednesday, and Friday with hemodialysis., Disp: 2 mL, Rfl: 0 .  doxycycline (VIBRAMYCIN) 100 MG capsule, Take 1 capsule (100 mg total) by mouth 2 (two) times daily., Disp: 20 capsule, Rfl: 0 .  ezetimibe (ZETIA) 10 MG tablet, Take 1 tablet (10 mg total) by mouth daily., Disp: 90 tablet, Rfl: 3 .  famotidine (PEPCID) 20 MG tablet, TAKE 1 TABLET BY MOUTH TWICE A DAY, Disp: 180 tablet, Rfl: 0 .  HYDROcodone-acetaminophen (NORCO/VICODIN) 5-325 MG tablet, Take 1 tablet by mouth every 6 (six) hours as needed for moderate pain., Disp: 30 tablet, Rfl: 0 .  HYDROcodone-acetaminophen (NORCO/VICODIN) 5-325 MG tablet, Take 1 tablet by mouth every 6 (six) hours as needed for moderate pain., Disp: 30 tablet, Rfl: 0 .  HYDROcodone-acetaminophen (NORCO/VICODIN) 5-325 MG tablet, Take 1 tablet by mouth every 6 (six) hours as needed for moderate pain., Disp: 30 tablet, Rfl: 0 .  levothyroxine (SYNTHROID) 100 MCG tablet, Take 1 tablet (100 mcg total) by mouth daily., Disp: 90 tablet, Rfl: 3 .  Methoxy PEG-Epoetin Beta (MIRCERA IJ), Mircera, Disp: , Rfl:  .  Multiple Vitamin (MULTIVITAMIN)  tablet, Take 1 tablet by mouth daily., Disp: , Rfl:  .  multivitamin (RENA-VIT) TABS tablet, Take 1 tablet by mouth daily., Disp: , Rfl:  .  nystatin cream (MYCOSTATIN), Apply 1 application topically 2 (two) times daily as needed for dry skin., Disp: 30 g, Rfl: 0 .  OXYGEN, Inhale 3 L into the lungs continuous., Disp: , Rfl:  .  pantoprazole (PROTONIX) 20 MG tablet, TAKE 1 TABLET BY MOUTH EVERY DAY, Disp: 90 tablet, Rfl: 1 .  pramipexole (MIRAPEX) 0.125 MG tablet, TAKE 1 TABLET (0.125 MG TOTAL) BY MOUTH AT BEDTIME., Disp: 90 tablet, Rfl: 0 .  Vitamin D, Ergocalciferol, (DRISDOL) 1.25 MG (50000 UT) CAPS capsule, Take 1 capsule (50,000 Units total) by mouth every Monday., Disp: 12 capsule, Rfl: 3  Allergies  Allergen Reactions  . Amoxicillin Rash    Has patient had a PCN reaction causing immediate rash, facial/tongue/throat swelling, SOB or lightheadedness with hypotension:YES Has patient had a PCN reaction causing severe rash involving mucus membranes or skin necrosis: Yes Has patient had a PCN reaction that required hospitalization No Has patient had a PCN reaction occurring within the last 10 years: Yes If all of the above answers are "NO", then may proceed with Cephalosporin use.   . Sulfa Drugs Cross Reactors Hives and Itching  . Percocet [Oxycodone-Acetaminophen] Itching and Rash    Did not happen last time she took it  . Sulfa Antibiotics Hives   Past Medical History:  Diagnosis Date  . Acute respiratory failure ([place]) 05/2017  . Anemia   . Anxiety   . Arthritis   . COPD (chronic obstructive pulmonary disease) ([place])   . Depression   . ESRD (end stage renal disease) ([place])    dialysis MWF  . GERD (gastroesophageal reflux disease)   . Gout   . History of blood transfusion   . History of diabetes mellitus    "diet controlled"  . HLD (hyperlipidemia)   . HOH (hard of hearing)    left ear  . Hypertension    hypotensive -since starting dialysis  . Hypothyroidism   . MRSA  (methicillin resistant staph aureus) culture positive 06/01/2017  . PAF (paroxysmal atrial fibrillation) ([place])    a. Echo 11/16:  Mild LVH, EF 55-60%, normal wall motion, MAC, mild MR, severe LAE (49 ml/m2), mild RVE, normal RVSF, mild RAE, mild TR, PASP 24 mmHg;  CHADS2-VASc: 4 >> Coumadin followed by PCP    Observations/Objective: Physical Exam Constitutional:      General: She is not in acute distress.    Appearance: Normal appearance. She is not ill-appearing or toxic-appearing.  Eyes:     General: No scleral icterus.       Right eye: No discharge.        Left eye: No discharge.     Conjunctiva/sclera: Conjunctivae normal.  Pulmonary:     Effort: Pulmonary effort is normal. No respiratory distress.  Neurological:     Mental Status: She is alert and  oriented to person, place, and time.  Psychiatric:        Mood and Affect: Mood normal.        Behavior: Behavior normal.        Thought Content: Thought content normal.        Judgment: Judgment normal.    Assessment and Plan: 1. History of left hip hemiarthroplasty - Continue to follow-up with orthopedic surgeon. Continue Tylenol for pain. She does have Norco if needed. Referring for [facility] PT.  - Ambulatory referral to [place]   Follow Up Instructions:   I discussed the assessment and treatment plan with the patient. The patient was provided an opportunity to ask questions and all were answered. The patient agreed with the plan and demonstrated an understanding of the instructions.   The patient was advised to call back or seek an in-person evaluation if the symptoms worsen or if the condition fails to improve as anticipated.  The above assessment and management plan was discussed with the patient. The patient verbalized understanding of and has agreed to the management plan. Patient is aware to call the clinic if symptoms persist or worsen. Patient is aware when to return to the clinic for a follow-up visit. Patient educated on  when it is appropriate to go to the emergency department.   Time call ended: 1:46 PM  I provided 12 minutes of face-to-face time during this encounter.   [name], MSN, APRN, FNP-C [facility] 01/10/20

## 2020-01-12 ENCOUNTER — Encounter: Payer: Self-pay | Admitting: Family Medicine

## 2020-01-12 MED ORDER — VITAMIN D (ERGOCALCIFEROL) 1.25 MG (50000 UNIT) PO CAPS: 50000.0000 [IU] | ORAL_CAPSULE | ORAL | 3 refills | Status: DC

## 2020-01-12 MED ORDER — ALENDRONATE SODIUM 70 MG PO TABS: 70.0000 mg | ORAL_TABLET | ORAL | 3 refills | Status: DC

## 2020-01-13 DIAGNOSIS — Z4901 Encounter for fitting and adjustment of extracorporeal dialysis catheter: Secondary | ICD-10-CM | POA: Diagnosis not present

## 2020-01-13 DIAGNOSIS — A499 Bacterial infection, unspecified: Secondary | ICD-10-CM | POA: Diagnosis not present

## 2020-01-13 DIAGNOSIS — D631 Anemia in chronic kidney disease: Secondary | ICD-10-CM | POA: Diagnosis not present

## 2020-01-13 DIAGNOSIS — Z992 Dependence on renal dialysis: Secondary | ICD-10-CM | POA: Diagnosis not present

## 2020-01-13 DIAGNOSIS — N186 End stage renal disease: Secondary | ICD-10-CM | POA: Diagnosis not present

## 2020-01-13 DIAGNOSIS — N185 Chronic kidney disease, stage 5: Secondary | ICD-10-CM | POA: Diagnosis not present

## 2020-01-13 DIAGNOSIS — N2581 Secondary hyperparathyroidism of renal origin: Secondary | ICD-10-CM | POA: Diagnosis not present

## 2020-01-13 DIAGNOSIS — E1121 Type 2 diabetes mellitus with diabetic nephropathy: Secondary | ICD-10-CM | POA: Diagnosis not present

## 2020-01-13 DIAGNOSIS — D689 Coagulation defect, unspecified: Secondary | ICD-10-CM | POA: Diagnosis not present

## 2020-01-14 DIAGNOSIS — M79672 Pain in left foot: Secondary | ICD-10-CM | POA: Diagnosis not present

## 2020-01-15 DIAGNOSIS — N185 Chronic kidney disease, stage 5: Secondary | ICD-10-CM | POA: Diagnosis not present

## 2020-01-15 DIAGNOSIS — E1121 Type 2 diabetes mellitus with diabetic nephropathy: Secondary | ICD-10-CM | POA: Diagnosis not present

## 2020-01-15 DIAGNOSIS — N186 End stage renal disease: Secondary | ICD-10-CM | POA: Diagnosis not present

## 2020-01-15 DIAGNOSIS — A499 Bacterial infection, unspecified: Secondary | ICD-10-CM | POA: Diagnosis not present

## 2020-01-15 DIAGNOSIS — D631 Anemia in chronic kidney disease: Secondary | ICD-10-CM | POA: Diagnosis not present

## 2020-01-15 DIAGNOSIS — D689 Coagulation defect, unspecified: Secondary | ICD-10-CM | POA: Diagnosis not present

## 2020-01-15 DIAGNOSIS — Z992 Dependence on renal dialysis: Secondary | ICD-10-CM | POA: Diagnosis not present

## 2020-01-15 DIAGNOSIS — Z4901 Encounter for fitting and adjustment of extracorporeal dialysis catheter: Secondary | ICD-10-CM | POA: Diagnosis not present

## 2020-01-15 DIAGNOSIS — N2581 Secondary hyperparathyroidism of renal origin: Secondary | ICD-10-CM | POA: Diagnosis not present

## 2020-01-16 ENCOUNTER — Other Ambulatory Visit: Payer: Self-pay

## 2020-01-16 ENCOUNTER — Ambulatory Visit: Payer: Medicare Other | Attending: Orthopedic Surgery | Admitting: Physical Therapy

## 2020-01-16 DIAGNOSIS — R29898 Other symptoms and signs involving the musculoskeletal system: Secondary | ICD-10-CM | POA: Insufficient documentation

## 2020-01-16 DIAGNOSIS — B351 Tinea unguium: Secondary | ICD-10-CM | POA: Diagnosis not present

## 2020-01-16 DIAGNOSIS — I70203 Unspecified atherosclerosis of native arteries of extremities, bilateral legs: Secondary | ICD-10-CM | POA: Diagnosis not present

## 2020-01-16 DIAGNOSIS — M25552 Pain in left hip: Secondary | ICD-10-CM | POA: Diagnosis not present

## 2020-01-16 DIAGNOSIS — L84 Corns and callosities: Secondary | ICD-10-CM | POA: Diagnosis not present

## 2020-01-16 DIAGNOSIS — M79676 Pain in unspecified toe(s): Secondary | ICD-10-CM | POA: Diagnosis not present

## 2020-01-16 NOTE — Therapy (Signed)
[facility] [place], [place], [address] Phone: [phone]   Fax:  [phone]  Physical Therapy Evaluation  Patient Details  Name: [name] MRN: [id number] Date of Birth: 07/30/1940 Referring Provider (PT): Mayer Masker MD   Encounter Date: 01/16/2020   PT End of Session - 01/16/20 1052    Visit Number 1    Number of Visits 16    Date for PT Re-Evaluation 04/15/20    Authorization Type FOTO.    PT Start Time 0903    PT Stop Time 0934    PT Time Calculation (min) 31 min    Activity Tolerance Patient tolerated treatment well    Behavior During Therapy [facility] for tasks assessed/performed           Past Medical History:  Diagnosis Date  . Acute respiratory failure ([place]) 05/2017  . Anemia   . Anxiety   . Arthritis   . COPD (chronic obstructive pulmonary disease) ([place])   . Depression   . ESRD (end stage renal disease) ([place])    dialysis MWF  . GERD (gastroesophageal reflux disease)   . Gout   . History of blood transfusion   . History of diabetes mellitus    "diet controlled"  . HLD (hyperlipidemia)   . HOH (hard of hearing)    left ear  . Hypertension    hypotensive -since starting dialysis  . Hypothyroidism   . MRSA (methicillin resistant staph aureus) culture positive 06/01/2017  . PAF (paroxysmal atrial fibrillation) ([place])    a. Echo 11/16:  Mild LVH, EF 55-60%, normal wall motion, MAC, mild MR, severe LAE (49 ml/m2), mild RVE, normal RVSF, mild RAE, mild TR, PASP 24 mmHg;  CHADS2-VASc: 4 >> Coumadin followed by PCP    Past Surgical History:  Procedure Laterality Date  . AV FISTULA PLACEMENT  04/03/2012   Procedure: ARTERIOVENOUS (AV) FISTULA CREATION;  Surgeon: Elam Dutch, MD;  Location: [place];  Service: Vascular;  Laterality: Left;  creation left brachial cephalic fistula   . BIOPSY  08/21/2018   Procedure: BIOPSY;  Surgeon: Thornton Park, MD;  Location: [place];  Service:  Gastroenterology;;  . CARPAL TUNNEL RELEASE Bilateral   . COLONOSCOPY W/ POLYPECTOMY    . COLONOSCOPY WITH PROPOFOL N/A 08/21/2018   Procedure: COLONOSCOPY WITH PROPOFOL;  Surgeon: Thornton Park, MD;  Location: [place];  Service: Gastroenterology;  Laterality: N/A;  . DILATION AND CURETTAGE OF UTERUS    . ESOPHAGOGASTRODUODENOSCOPY (EGD) WITH PROPOFOL N/A 08/21/2018   Procedure: ESOPHAGOGASTRODUODENOSCOPY (EGD) WITH PROPOFOL;  Surgeon: Thornton Park, MD;  Location: [place];  Service: Gastroenterology;  Laterality: N/A;  . EYE SURGERY Bilateral    bilateral cataract removal  . HEMATOMA EVACUATION Left 05/14/2018   Procedure: Incision and Drainage of Left Arm Hematoma;  Surgeon: Elam Dutch, MD;  Location: [facility] OR;  Service: Vascular;  Laterality: Left;  . Hemodialysis  catheter Right   . IR FLUORO GUIDE CV LINE RIGHT  05/26/2017  . IR REMOVAL TUN CV CATH W/O FL  05/24/2017  . IR US GUIDE VASC ACCESS RIGHT  05/26/2017  . LIGATION OF ARTERIOVENOUS  FISTULA Left 02/04/2015   Procedure: LIGATION OF BRACHIOCEPHALIC ARTERIOVENOUS  FISTULA;  Surgeon: Conrad Coyote, MD;  Location: [place];  Service: Vascular;  Laterality: Left;  Marland Kitchen MULTIPLE TOOTH EXTRACTIONS    . POLYPECTOMY  08/21/2018   Procedure: POLYPECTOMY;  Surgeon: Thornton Park, MD;  Location: [place];  Service: Gastroenterology;;  . PORTACATH PLACEMENT    .  REVERSE SHOULDER ARTHROPLASTY Left 01/22/2016   Procedure: LEFT REVERSE SHOULDER ARTHROPLASTY;  Surgeon: Netta Cedars, MD;  Location: [place];  Service: Orthopedics;  Laterality: Left;  . SHOULDER ARTHROSCOPY Bilateral   . SPLIT NIGHT STUDY  07/26/2015  . STERIOD INJECTION Right 01/22/2016   Procedure: RIGHT RING FINGER STEROID INJECTION;  Surgeon: Netta Cedars, MD;  Location: [place];  Service: Orthopedics;  Laterality: Right;  . TEE WITHOUT CARDIOVERSION N/A 05/26/2017   Procedure: TRANSESOPHAGEAL ECHOCARDIOGRAM (TEE);  Surgeon: Lelon Perla, MD;  Location: [place];  Service: Cardiovascular;  Laterality: N/A;  . THROMBECTOMY BRACHIAL ARTERY Left 02/06/2015   Procedure: EVACUATION OF LEFT ARM HEMATOMA;  Surgeon: Angelia Mould, MD;  Location: [place];  Service: Vascular;  Laterality: Left;  . TOTAL KNEE ARTHROPLASTY Bilateral   . TUBAL LIGATION      There were no vitals filed for this visit.    Subjective Assessment - 01/16/20 1058    Subjective COVID-19 screen performed prior to patient entering clinic.  The pateint presents to the clinic today with her daughter in law.  She underwent a left hip hemiarthroplasty on on 11/30/19.  She had a rehab stay but reportedly had an incident in which her wound broke open.  She the underwent a wound  debridement on 12/19/19 and an irrigation and another debridement on 12/21/19.  Today her pain is low.  Her daughter in law Baldo Ash is performing wound care at home and had the incision covered with sterilr gauze today.  She was using a Rollator prior to her hip surgery but is now using a FWW as she feels safer.  The patient is well educated in her hip precautions ("Doctor does not want me to flex my hip past 90 degrees).    Patient is accompained by: Family member    Pertinent History PAF, HTN, Hypothyroidism, bilateral ankle braces, bilateral shoulder and knee surgeries, OP (patient reports), DM, Dialysis treatments, OA, HOH.    How long can you walk comfortably? Around home with a FWW WBAT over her left LE.    Patient Stated Goals Get around better.    Currently in Pain? Yes    Pain Score 2     Pain Location Hip    Pain Orientation Left    Pain Descriptors / Indicators Aching;Dull    Pain Type Surgical pain    Pain Onset More than a month ago              [facility] PT Assessment - 01/16/20 0001      Assessment   Medical Diagnosis Left hip hemiarthroplasty.    Referring Provider (PT) Mayer Masker MD    Onset Date/Surgical Date --   11/30/19 (surgery date).     Precautions   Precaution Comments  Left hip precautions.  Monitored walking at all times.      Restrictions   Weight Bearing Restrictions --   WBAT over left LE.     Balance Screen   Has the patient fallen in the past 6 months Yes    How many times? --   1.   Has the patient had a decrease in activity level because of a fear of falling?  Yes    Is the patient reluctant to leave their home because of a fear of falling?  Yes      [place] residence      Prior Function   Level of Independence Independent  Observation/Other Assessments   Focus on Therapeutic Outcomes (FOTO)  68% limitation.      Posture/Postural Control   Posture/Postural Control Postural limitations    Postural Limitations Rounded Shoulders;Forward head;Flexed trunk    Posture Comments Bilateral tibial ER.  Bilateral ankel brace intact (left is comprised of metal).      ROM / Strength   AROM / PROM / Strength AROM;Strength      AROM   Overall AROM Comments Deferred due to hip precautions.      Strength   Overall Strength Comments Left knee extension=3+/5, left hip abduction grossly 3 to 3+/5.      Transfers   Comments Sit to stand with armrests CGA.  On/off treatment table with CGS.      Ambulation/Gait   Gait Comments Supervised gait withe FWW.  Patient walks in some spinal flexion.                      Objective measurements completed on examination: See above findings.                    PT Long Term Goals - 01/16/20 1211      PT LONG TERM GOAL #1   Title Independent with an HEP.    Time 8    Status New      PT LONG TERM GOAL #2   Title Perform a reciprocating stair gait with one railing.    Time 8    Period Weeks    Status New      PT LONG TERM GOAL #3   Title Walk in clinic 500 feet withno more than supervision.    Time 8    Period Weeks    Status New      PT LONG TERM GOAL #4   Title Left hip strength to a solid 4 to 4+/5 to increase stability for  functional tasks.    Time 8    Period Weeks    Status New                  Plan - 01/16/20 1200    Clinical Impression Statement The patient presents to OPPT s/p hip hemiarthroplasty and two subsquent surgeries for debridement.  Patient is doin well today with a low pain-level.  Her daughter-in-law is performing daily. Dressing changes to her left hip.  She is making transitory movements with CGA only and is walking safely with her FWW with CGA.  She is very well educated regarding her left hip precautions and can recite them easily.  She has an expected loss of left LE strength at this time.  Patient will benefit from skilled physical therapy intervention to address deficits and pain.    Personal Factors and Comorbidities Comorbidity 1;Comorbidity 2    Comorbidities PAF, HTN, Hypothyroidism, bilateral ankle braces, bilateral shoulder and knee surgeries, OP (patient reports), DM, Dialysis treatments, OA, HOH.    Examination-Activity Limitations Locomotion Level;Transfers;Other    Stability/Clinical Decision Making Stable/Uncomplicated    Rehab Potential Good    PT Frequency 2x / week    PT Duration 8 weeks    PT Treatment/Interventions ADLs/Self Care Home Management;Functional mobility training;Stair training;Gait training;Therapeutic activities;Therapeutic exercise;Neuromuscular re-education    PT Next Visit Plan Left hip precautions:  Nustep, GT, Parallel bar activites, LE ther ex.    Consulted and Agree with Plan of Care Patient           Patient will benefit from skilled therapeutic  intervention in order to improve the following deficits and impairments:  Pain, Abnormal gait, Decreased activity tolerance, Decreased strength  Visit Diagnosis: Pain in left hip - Plan: PT plan of care cert/re-cert  Weakness of left hip - Plan: PT plan of care cert/re-cert     Problem List Patient Active Problem List   Diagnosis Date Noted  . Open wound of left hip 12/19/2019  . Educated  about COVID-19 virus infection 08/06/2019  . Controlled substance agreement signed 05/16/2019  . Arthritis 02/14/2019  . Chronic midline thoracic back pain 02/14/2019  . Restless leg syndrome 10/26/2018  . Vitamin D deficiency 10/26/2018  . Age-related osteoporosis without current pathological fracture 10/26/2018  . Hypertension   . Gout   . COPD (chronic obstructive pulmonary disease) ([place])   . Anxiety   . 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 Adenomatous polyp of descending colon   . Adenomatous polyp of transverse colon   . Depression 08/19/2018  . Pernicious anemia 12/12/2017  . Moderate protein-calorie malnutrition ([place]) 06/02/2017  . Gastroesophageal reflux disease without esophagitis   . Dyslipidemia 02/09/2016  . Do not resuscitate 03/31/2015  . PAF (paroxysmal atrial fibrillation) ([place]) 02/04/2015  . Hypothyroidism   . Anemia in chronic kidney disease 12/09/2014  . Steal syndrome dialysis vascular access ([place]) 12/03/2014  . Secondary hyperparathyroidism of renal origin ([place]) 11/17/2014  . Physical deconditioning 08/04/2013  . Chronic kidney disease due to type 2 diabetes mellitus ([place]) 07/26/2013  . ESRD (end stage renal disease) on dialysis ([place]) 03/15/2012    Gracious Renken, [place] MPT 01/16/2020, 12:21 PM  [facility] [address] [place], [place], [address] Phone: [phone]   Fax:  [phone]  Name: [name] MRN: [id number] Date of Birth: [birth year]

## 2020-01-17 DIAGNOSIS — Z992 Dependence on renal dialysis: Secondary | ICD-10-CM | POA: Diagnosis not present

## 2020-01-17 DIAGNOSIS — N185 Chronic kidney disease, stage 5: Secondary | ICD-10-CM | POA: Diagnosis not present

## 2020-01-17 DIAGNOSIS — E1121 Type 2 diabetes mellitus with diabetic nephropathy: Secondary | ICD-10-CM | POA: Diagnosis not present

## 2020-01-17 DIAGNOSIS — D689 Coagulation defect, unspecified: Secondary | ICD-10-CM | POA: Diagnosis not present

## 2020-01-17 DIAGNOSIS — N2581 Secondary hyperparathyroidism of renal origin: Secondary | ICD-10-CM | POA: Diagnosis not present

## 2020-01-17 DIAGNOSIS — D631 Anemia in chronic kidney disease: Secondary | ICD-10-CM | POA: Diagnosis not present

## 2020-01-17 DIAGNOSIS — N186 End stage renal disease: Secondary | ICD-10-CM | POA: Diagnosis not present

## 2020-01-17 DIAGNOSIS — A499 Bacterial infection, unspecified: Secondary | ICD-10-CM | POA: Diagnosis not present

## 2020-01-17 DIAGNOSIS — Z4901 Encounter for fitting and adjustment of extracorporeal dialysis catheter: Secondary | ICD-10-CM | POA: Diagnosis not present

## 2020-01-20 DIAGNOSIS — D631 Anemia in chronic kidney disease: Secondary | ICD-10-CM | POA: Diagnosis not present

## 2020-01-20 DIAGNOSIS — N2581 Secondary hyperparathyroidism of renal origin: Secondary | ICD-10-CM | POA: Diagnosis not present

## 2020-01-20 DIAGNOSIS — A499 Bacterial infection, unspecified: Secondary | ICD-10-CM | POA: Diagnosis not present

## 2020-01-20 DIAGNOSIS — N186 End stage renal disease: Secondary | ICD-10-CM | POA: Diagnosis not present

## 2020-01-20 DIAGNOSIS — Z4901 Encounter for fitting and adjustment of extracorporeal dialysis catheter: Secondary | ICD-10-CM | POA: Diagnosis not present

## 2020-01-20 DIAGNOSIS — E1121 Type 2 diabetes mellitus with diabetic nephropathy: Secondary | ICD-10-CM | POA: Diagnosis not present

## 2020-01-20 DIAGNOSIS — D689 Coagulation defect, unspecified: Secondary | ICD-10-CM | POA: Diagnosis not present

## 2020-01-20 DIAGNOSIS — Z992 Dependence on renal dialysis: Secondary | ICD-10-CM | POA: Diagnosis not present

## 2020-01-20 DIAGNOSIS — N185 Chronic kidney disease, stage 5: Secondary | ICD-10-CM | POA: Diagnosis not present

## 2020-01-21 ENCOUNTER — Ambulatory Visit: Payer: Medicare Other | Admitting: *Deleted

## 2020-01-21 ENCOUNTER — Other Ambulatory Visit: Payer: Self-pay

## 2020-01-21 DIAGNOSIS — M25552 Pain in left hip: Secondary | ICD-10-CM

## 2020-01-21 DIAGNOSIS — R29898 Other symptoms and signs involving the musculoskeletal system: Secondary | ICD-10-CM | POA: Diagnosis not present

## 2020-01-21 NOTE — Therapy (Signed)
[facility] [place], [place], [address] Phone: [phone]   Fax:  [phone]  Physical Therapy Treatment  Patient Details  Name: [name] MRN: [id number] Date of Birth: [birth year] Referring Provider (PT): Mayer Masker MD   Encounter Date: 01/21/2020   PT End of Session - 01/21/20 0907    Visit Number 2    Number of Visits 16    Date for PT Re-Evaluation 04/15/20    Authorization Type FOTO.    PT Start Time 0900    PT Stop Time 0946    PT Time Calculation (min) 46 min           Past Medical History:  Diagnosis Date  . Acute respiratory failure ([place]) 05/2017  . Anemia   . Anxiety   . Arthritis   . COPD (chronic obstructive pulmonary disease) ([place])   . Depression   . ESRD (end stage renal disease) ([place])    dialysis MWF  . GERD (gastroesophageal reflux disease)   . Gout   . History of blood transfusion   . History of diabetes mellitus    "diet controlled"  . HLD (hyperlipidemia)   . HOH (hard of hearing)    left ear  . Hypertension    hypotensive -since starting dialysis  . Hypothyroidism   . MRSA (methicillin resistant staph aureus) culture positive 06/01/2017  . PAF (paroxysmal atrial fibrillation) ([place])    a. Echo 11/16:  Mild LVH, EF 55-60%, normal wall motion, MAC, mild MR, severe LAE (49 ml/m2), mild RVE, normal RVSF, mild RAE, mild TR, PASP 24 mmHg;  CHADS2-VASc: 4 >> Coumadin followed by PCP    Past Surgical History:  Procedure Laterality Date  . AV FISTULA PLACEMENT  04/03/2012   Procedure: ARTERIOVENOUS (AV) FISTULA CREATION;  Surgeon: Elam Dutch, MD;  Location: [place];  Service: Vascular;  Laterality: Left;  creation left brachial cephalic fistula   . BIOPSY  08/21/2018   Procedure: BIOPSY;  Surgeon: Thornton Park, MD;  Location: [place];  Service: Gastroenterology;;  . CARPAL TUNNEL RELEASE Bilateral   . COLONOSCOPY W/ POLYPECTOMY    . COLONOSCOPY WITH PROPOFOL N/A  08/21/2018   Procedure: COLONOSCOPY WITH PROPOFOL;  Surgeon: Thornton Park, MD;  Location: [place];  Service: Gastroenterology;  Laterality: N/A;  . DILATION AND CURETTAGE OF UTERUS    . ESOPHAGOGASTRODUODENOSCOPY (EGD) WITH PROPOFOL N/A 08/21/2018   Procedure: ESOPHAGOGASTRODUODENOSCOPY (EGD) WITH PROPOFOL;  Surgeon: Thornton Park, MD;  Location: [place];  Service: Gastroenterology;  Laterality: N/A;  . EYE SURGERY Bilateral    bilateral cataract removal  . HEMATOMA EVACUATION Left 05/14/2018   Procedure: Incision and Drainage of Left Arm Hematoma;  Surgeon: Elam Dutch, MD;  Location: [facility] OR;  Service: Vascular;  Laterality: Left;  . Hemodialysis  catheter Right   . IR FLUORO GUIDE CV LINE RIGHT  05/26/2017  . IR REMOVAL TUN CV CATH W/O FL  05/24/2017  . IR US GUIDE VASC ACCESS RIGHT  05/26/2017  . LIGATION OF ARTERIOVENOUS  FISTULA Left 02/04/2015   Procedure: LIGATION OF BRACHIOCEPHALIC ARTERIOVENOUS  FISTULA;  Surgeon: Conrad Graeagle, MD;  Location: [place];  Service: Vascular;  Laterality: Left;  Marland Kitchen MULTIPLE TOOTH EXTRACTIONS    . POLYPECTOMY  08/21/2018   Procedure: POLYPECTOMY;  Surgeon: Thornton Park, MD;  Location: [place];  Service: Gastroenterology;;  . PORTACATH PLACEMENT    . REVERSE SHOULDER ARTHROPLASTY Left 01/22/2016   Procedure: LEFT REVERSE SHOULDER ARTHROPLASTY;  Surgeon: Richardson Landry  Veverly Fells, MD;  Location: [place];  Service: Orthopedics;  Laterality: Left;  . SHOULDER ARTHROSCOPY Bilateral   . SPLIT NIGHT STUDY  07/26/2015  . STERIOD INJECTION Right 01/22/2016   Procedure: RIGHT RING FINGER STEROID INJECTION;  Surgeon: Netta Cedars, MD;  Location: [place];  Service: Orthopedics;  Laterality: Right;  . TEE WITHOUT CARDIOVERSION N/A 05/26/2017   Procedure: TRANSESOPHAGEAL ECHOCARDIOGRAM (TEE);  Surgeon: Lelon Perla, MD;  Location: [place];  Service: Cardiovascular;  Laterality: N/A;  . THROMBECTOMY BRACHIAL ARTERY Left 02/06/2015   Procedure: EVACUATION  OF LEFT ARM HEMATOMA;  Surgeon: Angelia Mould, MD;  Location: [facility];  Service: Vascular;  Laterality: Left;  . TOTAL KNEE ARTHROPLASTY Bilateral   . TUBAL LIGATION      There were no vitals filed for this visit.   Subjective Assessment - 01/21/20 0905    Subjective COVID-19 screen performed prior to patient entering clinic.  She underwent a left hip hemiarthroplasty on on 11/30/19.  No pain LT hip.    Pertinent History PAF, HTN, Hypothyroidism, bilateral ankle braces, bilateral shoulder and knee surgeries, OP (patient reports), DM, Dialysis treatments, OA, HOH.    How long can you walk comfortably? Around home with a FWW WBAT over her left LE.    Currently in Pain? No/denies    Pain Location Hip    Pain Orientation Left    Pain Descriptors / Indicators Sore                             [facility] Adult PT Treatment/Exercise - 01/21/20 0001      Exercises   Exercises Knee/Hip      Knee/Hip Exercises: Aerobic   Nustep L1 x12 mins      Knee/Hip Exercises: Standing   Hip Flexion AROM;Both;3 sets;10 reps    Hip Abduction AROM;Both;3 sets;10 reps      Knee/Hip Exercises: Seated   Long Arc Quad Left;3 sets;10 reps    Long Arc Quad Weight 2 lbs.    Ball Squeeze 3x10    Clamshell with TheraBand Red   3x10 hold 5 secs                       PT Long Term Goals - 01/16/20 1211      PT LONG TERM GOAL #1   Title Independent with an HEP.    Time 8    Status New      PT LONG TERM GOAL #2   Title Perform a reciprocating stair gait with one railing.    Time 8    Period Weeks    Status New      PT LONG TERM GOAL #3   Title Walk in clinic 500 feet withno more than supervision.    Time 8    Period Weeks    Status New      PT LONG TERM GOAL #4   Title Left hip strength to a solid 4 to 4+/5 to increase stability for functional tasks.    Time 8    Period Weeks    Status New                 Plan - 01/21/20 1515    Clinical Impression  Statement Pt arrived today doing fairly well and was able to perform standing as well as seated LE strengthening exs and did well. She is currently ambulating with FWW and needs SBA/CGA during  standing act's. Good job today without complaints    Personal Factors and Comorbidities Comorbidity 1;Comorbidity 2    Comorbidities PAF, HTN, Hypothyroidism, bilateral ankle braces, bilateral shoulder and knee surgeries, OP (patient reports), DM, Dialysis treatments, OA, HOH.    Examination-Activity Limitations Locomotion Level;Transfers;Other    Stability/Clinical Decision Making Stable/Uncomplicated    Rehab Potential Good    PT Frequency 2x / week    PT Duration 8 weeks    PT Treatment/Interventions ADLs/Self Care Home Management;Functional mobility training;Stair training;Gait training;Therapeutic activities;Therapeutic exercise;Neuromuscular re-education    PT Next Visit Plan Left hip precautions:  Nustep, GT, Parallel bar activites, LE ther ex.    Consulted and Agree with Plan of Care Patient           Patient will benefit from skilled therapeutic intervention in order to improve the following deficits and impairments:  Pain, Abnormal gait, Decreased activity tolerance, Decreased strength  Visit Diagnosis: Pain in left hip  Weakness of left hip     Problem List Patient Active Problem List   Diagnosis Date Noted  . Open wound of left hip 12/19/2019  . Educated about COVID-19 virus infection 08/06/2019  . Controlled substance agreement signed 05/16/2019  . Arthritis 02/14/2019  . Chronic midline thoracic back pain 02/14/2019  . Restless leg syndrome 10/26/2018  . Vitamin D deficiency 10/26/2018  . Age-related osteoporosis without current pathological fracture 10/26/2018  . Hypertension   . Gout   . COPD (chronic obstructive pulmonary disease) ([place])   . Anxiety   . 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 Adenomatous polyp of descending colon   . Adenomatous polyp of  transverse colon   . Depression 08/19/2018  . Pernicious anemia 12/12/2017  . Moderate protein-calorie malnutrition ([place]) 06/02/2017  . Gastroesophageal reflux disease without esophagitis   . Dyslipidemia 02/09/2016  . Do not resuscitate 03/31/2015  . PAF (paroxysmal atrial fibrillation) ([place]) 02/04/2015  . Hypothyroidism   . Anemia in chronic kidney disease 12/09/2014  . Steal syndrome dialysis vascular access ([place]) 12/03/2014  . Secondary hyperparathyroidism of renal origin ([place]) 11/17/2014  . Physical deconditioning 08/04/2013  . Chronic kidney disease due to type 2 diabetes mellitus ([place]) 07/26/2013  . ESRD (end stage renal disease) on dialysis [facility]) 03/15/2012    Sable Knoles,CHRIS, PTA 01/21/2020, 3:22 PM  [facility] [address] [place], [place], [address] Phone: [phone]   Fax:  [phone]  Name: BRENDI MCCARROLL MRN: [id number] Date of Birth: 11/04/1940

## 2020-01-22 ENCOUNTER — Other Ambulatory Visit: Payer: Self-pay | Admitting: Family Medicine

## 2020-01-22 DIAGNOSIS — D689 Coagulation defect, unspecified: Secondary | ICD-10-CM | POA: Diagnosis not present

## 2020-01-22 DIAGNOSIS — D631 Anemia in chronic kidney disease: Secondary | ICD-10-CM | POA: Diagnosis not present

## 2020-01-22 DIAGNOSIS — K219 Gastro-esophageal reflux disease without esophagitis: Secondary | ICD-10-CM

## 2020-01-22 DIAGNOSIS — N2581 Secondary hyperparathyroidism of renal origin: Secondary | ICD-10-CM | POA: Diagnosis not present

## 2020-01-22 DIAGNOSIS — N185 Chronic kidney disease, stage 5: Secondary | ICD-10-CM | POA: Diagnosis not present

## 2020-01-22 DIAGNOSIS — A499 Bacterial infection, unspecified: Secondary | ICD-10-CM | POA: Diagnosis not present

## 2020-01-22 DIAGNOSIS — Z992 Dependence on renal dialysis: Secondary | ICD-10-CM | POA: Diagnosis not present

## 2020-01-22 DIAGNOSIS — Z4901 Encounter for fitting and adjustment of extracorporeal dialysis catheter: Secondary | ICD-10-CM | POA: Diagnosis not present

## 2020-01-22 DIAGNOSIS — N186 End stage renal disease: Secondary | ICD-10-CM | POA: Diagnosis not present

## 2020-01-22 DIAGNOSIS — E1121 Type 2 diabetes mellitus with diabetic nephropathy: Secondary | ICD-10-CM | POA: Diagnosis not present

## 2020-01-23 ENCOUNTER — Other Ambulatory Visit: Payer: Self-pay

## 2020-01-23 ENCOUNTER — Ambulatory Visit: Payer: Medicare Other | Admitting: *Deleted

## 2020-01-23 DIAGNOSIS — R29898 Other symptoms and signs involving the musculoskeletal system: Secondary | ICD-10-CM

## 2020-01-23 DIAGNOSIS — M25552 Pain in left hip: Secondary | ICD-10-CM

## 2020-01-23 NOTE — Therapy (Signed)
[facility] [place], [place], [address] Phone: [phone]   Fax:  [phone]  Physical Therapy Treatment  Patient Details  Name: [name] MRN: [id number] Date of Birth: 11/13/1940 Referring Provider (PT): Mayer Masker MD   Encounter Date: 01/23/2020   PT End of Session - 01/23/20 1037    Visit Number 3    Number of Visits 16    Date for PT Re-Evaluation 04/15/20    Authorization Type FOTO.    PT Start Time 1030    PT Stop Time 1120    PT Time Calculation (min) 50 min           Past Medical History:  Diagnosis Date  . Acute respiratory failure ([place]) 05/2017  . Anemia   . Anxiety   . Arthritis   . COPD (chronic obstructive pulmonary disease) ([place])   . Depression   . ESRD (end stage renal disease) ([place])    dialysis MWF  . GERD (gastroesophageal reflux disease)   . Gout   . History of blood transfusion   . History of diabetes mellitus    "diet controlled"  . HLD (hyperlipidemia)   . HOH (hard of hearing)    left ear  . Hypertension    hypotensive -since starting dialysis  . Hypothyroidism   . MRSA (methicillin resistant staph aureus) culture positive 06/01/2017  . PAF (paroxysmal atrial fibrillation) ([place])    a. Echo 11/16:  Mild LVH, EF 55-60%, normal wall motion, MAC, mild MR, severe LAE (49 ml/m2), mild RVE, normal RVSF, mild RAE, mild TR, PASP 24 mmHg;  CHADS2-VASc: 4 >> Coumadin followed by PCP    Past Surgical History:  Procedure Laterality Date  . AV FISTULA PLACEMENT  04/03/2012   Procedure: ARTERIOVENOUS (AV) FISTULA CREATION;  Surgeon: Elam Dutch, MD;  Location: [place];  Service: Vascular;  Laterality: Left;  creation left brachial cephalic fistula   . BIOPSY  08/21/2018   Procedure: BIOPSY;  Surgeon: Thornton Park, MD;  Location: [place];  Service: Gastroenterology;;  . CARPAL TUNNEL RELEASE Bilateral   . COLONOSCOPY W/ POLYPECTOMY    . COLONOSCOPY WITH PROPOFOL N/A  08/21/2018   Procedure: COLONOSCOPY WITH PROPOFOL;  Surgeon: Thornton Park, MD;  Location: [place];  Service: Gastroenterology;  Laterality: N/A;  . DILATION AND CURETTAGE OF UTERUS    . ESOPHAGOGASTRODUODENOSCOPY (EGD) WITH PROPOFOL N/A 08/21/2018   Procedure: ESOPHAGOGASTRODUODENOSCOPY (EGD) WITH PROPOFOL;  Surgeon: Thornton Park, MD;  Location: [place];  Service: Gastroenterology;  Laterality: N/A;  . EYE SURGERY Bilateral    bilateral cataract removal  . HEMATOMA EVACUATION Left 05/14/2018   Procedure: Incision and Drainage of Left Arm Hematoma;  Surgeon: Elam Dutch, MD;  Location: [facility] OR;  Service: Vascular;  Laterality: Left;  . Hemodialysis  catheter Right   . IR FLUORO GUIDE CV LINE RIGHT  05/26/2017  . IR REMOVAL TUN CV CATH W/O FL  05/24/2017  . IR US GUIDE VASC ACCESS RIGHT  05/26/2017  . LIGATION OF ARTERIOVENOUS  FISTULA Left 02/04/2015   Procedure: LIGATION OF BRACHIOCEPHALIC ARTERIOVENOUS  FISTULA;  Surgeon: Conrad [place], MD;  Location: [place];  Service: Vascular;  Laterality: Left;  Marland Kitchen MULTIPLE TOOTH EXTRACTIONS    . POLYPECTOMY  08/21/2018   Procedure: POLYPECTOMY;  Surgeon: Thornton Park, MD;  Location: [place];  Service: Gastroenterology;;  . PORTACATH PLACEMENT    . REVERSE SHOULDER ARTHROPLASTY Left 01/22/2016   Procedure: LEFT REVERSE SHOULDER ARTHROPLASTY;  Surgeon: Richardson Landry  Veverly Fells, MD;  Location: [place];  Service: Orthopedics;  Laterality: Left;  . SHOULDER ARTHROSCOPY Bilateral   . SPLIT NIGHT STUDY  07/26/2015  . STERIOD INJECTION Right 01/22/2016   Procedure: RIGHT RING FINGER STEROID INJECTION;  Surgeon: Netta Cedars, MD;  Location: [place];  Service: Orthopedics;  Laterality: Right;  . TEE WITHOUT CARDIOVERSION N/A 05/26/2017   Procedure: TRANSESOPHAGEAL ECHOCARDIOGRAM (TEE);  Surgeon: Lelon Perla, MD;  Location: [place];  Service: Cardiovascular;  Laterality: N/A;  . THROMBECTOMY BRACHIAL ARTERY Left 02/06/2015   Procedure: EVACUATION  OF LEFT ARM HEMATOMA;  Surgeon: Angelia Mould, MD;  Location: [place];  Service: Vascular;  Laterality: Left;  . TOTAL KNEE ARTHROPLASTY Bilateral   . TUBAL LIGATION      There were no vitals filed for this visit.   Subjective Assessment - 01/23/20 1036    Subjective COVID-19 screen performed prior to patient entering clinic.  She underwent a left hip hemiarthroplasty on on 11/30/19.  No pain LT hip.Both knees hurt    Pertinent History PAF, HTN, Hypothyroidism, bilateral ankle braces, bilateral shoulder and knee surgeries, OP (patient reports), DM, Dialysis treatments, OA, HOH.    How long can you walk comfortably? Around home with a FWW WBAT over her left LE.                             [facility] Adult PT Treatment/Exercise - 01/23/20 0001      Exercises   Exercises Knee/Hip      Knee/Hip Exercises: Aerobic   Nustep L2 x15 mins  seat 9   hip precautions     Knee/Hip Exercises: Standing   Hip Flexion AROM;Both;3 sets;10 reps    Hip Abduction AROM;Both;3 sets;10 reps    Other Standing Knee Exercises balance on airex pad with reachouts 2x20,x10   one step holds x 3 each side      Knee/Hip Exercises: Seated   Long Arc Quad Left;3 sets;10 reps    Long Arc Quad Weight 2 lbs.    Ball Squeeze 3x10    Clamshell with TheraBand Red   3x10 hold 5 secs                       PT Long Term Goals - 01/16/20 1211      PT LONG TERM GOAL #1   Title Independent with an HEP.    Time 8    Status New      PT LONG TERM GOAL #2   Title Perform a reciprocating stair gait with one railing.    Time 8    Period Weeks    Status New      PT LONG TERM GOAL #3   Title Walk in clinic 500 feet withno more than supervision.    Time 8    Period Weeks    Status New      PT LONG TERM GOAL #4   Title Left hip strength to a solid 4 to 4+/5 to increase stability for functional tasks.    Time 8    Period Weeks    Status New                 Plan - 01/23/20  1038    Clinical Impression Statement Pt arrived today doing fairly well with no pai LT hip. She was able to perform standing as well as seated LE exs again today as well as  balance. SBA/ CGA during balance exs. Pt did great    Personal Factors and Comorbidities Comorbidity 1;Comorbidity 2    Comorbidities PAF, HTN, Hypothyroidism, bilateral ankle braces, bilateral shoulder and knee surgeries, OP (patient reports), DM, Dialysis treatments, OA, HOH.    Examination-Activity Limitations Locomotion Level;Transfers;Other    Stability/Clinical Decision Making Stable/Uncomplicated    Rehab Potential Good    PT Frequency 2x / week    PT Treatment/Interventions ADLs/Self Care Home Management;Functional mobility training;Stair training;Gait training;Therapeutic activities;Therapeutic exercise;Neuromuscular re-education    PT Next Visit Plan Left hip precautions:  Nustep, GT, Parallel bar activites, LE ther ex.    Consulted and Agree with Plan of Care Patient           Patient will benefit from skilled therapeutic intervention in order to improve the following deficits and impairments:  Pain, Abnormal gait, Decreased activity tolerance, Decreased strength  Visit Diagnosis: Pain in left hip  Weakness of left hip     Problem List Patient Active Problem List   Diagnosis Date Noted  . Open wound of left hip 12/19/2019  . Educated about COVID-19 virus infection 08/06/2019  . Controlled substance agreement signed 05/16/2019  . Arthritis 02/14/2019  . Chronic midline thoracic back pain 02/14/2019  . Restless leg syndrome 10/26/2018  . Vitamin D deficiency 10/26/2018  . Age-related osteoporosis without current pathological fracture 10/26/2018  . Hypertension   . Gout   . COPD (chronic obstructive pulmonary disease) ([place])   . Anxiety   . 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 Adenomatous polyp of descending colon   . Adenomatous polyp of transverse colon   . Depression 08/19/2018  .  Pernicious anemia 12/12/2017  . Moderate protein-calorie malnutrition ([place]) 06/02/2017  . Gastroesophageal reflux disease without esophagitis   . Dyslipidemia 02/09/2016  . Do not resuscitate 03/31/2015  . PAF (paroxysmal atrial fibrillation) ([place]) 02/04/2015  . Hypothyroidism   . Anemia in chronic kidney disease 12/09/2014  . Steal syndrome dialysis vascular access ([place]) 12/03/2014  . Secondary hyperparathyroidism of renal origin ([place]) 11/17/2014  . Physical deconditioning 08/04/2013  . Chronic kidney disease due to type 2 diabetes mellitus ([place]) 07/26/2013  . ESRD (end stage renal disease) on dialysis ([place]) 03/15/2012    Ayush Boulet,CHRIS , PTA 01/23/2020, 1:25 PM  [facility] [address] [place], [place], [address] Phone: [phone]   Fax:  [phone]  Name: [name] MRN: [id number] Date of Birth: April 28, 1940

## 2020-01-24 DIAGNOSIS — D631 Anemia in chronic kidney disease: Secondary | ICD-10-CM | POA: Diagnosis not present

## 2020-01-24 DIAGNOSIS — Z992 Dependence on renal dialysis: Secondary | ICD-10-CM | POA: Diagnosis not present

## 2020-01-24 DIAGNOSIS — N2581 Secondary hyperparathyroidism of renal origin: Secondary | ICD-10-CM | POA: Diagnosis not present

## 2020-01-24 DIAGNOSIS — A499 Bacterial infection, unspecified: Secondary | ICD-10-CM | POA: Diagnosis not present

## 2020-01-24 DIAGNOSIS — E1121 Type 2 diabetes mellitus with diabetic nephropathy: Secondary | ICD-10-CM | POA: Diagnosis not present

## 2020-01-24 DIAGNOSIS — N186 End stage renal disease: Secondary | ICD-10-CM | POA: Diagnosis not present

## 2020-01-24 DIAGNOSIS — D689 Coagulation defect, unspecified: Secondary | ICD-10-CM | POA: Diagnosis not present

## 2020-01-24 DIAGNOSIS — N185 Chronic kidney disease, stage 5: Secondary | ICD-10-CM | POA: Diagnosis not present

## 2020-01-24 DIAGNOSIS — Z4901 Encounter for fitting and adjustment of extracorporeal dialysis catheter: Secondary | ICD-10-CM | POA: Diagnosis not present

## 2020-01-27 DIAGNOSIS — Z4901 Encounter for fitting and adjustment of extracorporeal dialysis catheter: Secondary | ICD-10-CM | POA: Diagnosis not present

## 2020-01-27 DIAGNOSIS — N185 Chronic kidney disease, stage 5: Secondary | ICD-10-CM | POA: Diagnosis not present

## 2020-01-27 DIAGNOSIS — E1121 Type 2 diabetes mellitus with diabetic nephropathy: Secondary | ICD-10-CM | POA: Diagnosis not present

## 2020-01-27 DIAGNOSIS — N2581 Secondary hyperparathyroidism of renal origin: Secondary | ICD-10-CM | POA: Diagnosis not present

## 2020-01-27 DIAGNOSIS — D689 Coagulation defect, unspecified: Secondary | ICD-10-CM | POA: Diagnosis not present

## 2020-01-27 DIAGNOSIS — Z992 Dependence on renal dialysis: Secondary | ICD-10-CM | POA: Diagnosis not present

## 2020-01-27 DIAGNOSIS — D631 Anemia in chronic kidney disease: Secondary | ICD-10-CM | POA: Diagnosis not present

## 2020-01-27 DIAGNOSIS — N186 End stage renal disease: Secondary | ICD-10-CM | POA: Diagnosis not present

## 2020-01-27 DIAGNOSIS — A499 Bacterial infection, unspecified: Secondary | ICD-10-CM | POA: Diagnosis not present

## 2020-01-28 DIAGNOSIS — Z96649 Presence of unspecified artificial hip joint: Secondary | ICD-10-CM | POA: Diagnosis not present

## 2020-01-28 DIAGNOSIS — A498 Other bacterial infections of unspecified site: Secondary | ICD-10-CM | POA: Diagnosis not present

## 2020-01-28 DIAGNOSIS — I480 Paroxysmal atrial fibrillation: Secondary | ICD-10-CM | POA: Diagnosis not present

## 2020-01-28 DIAGNOSIS — N186 End stage renal disease: Secondary | ICD-10-CM | POA: Diagnosis not present

## 2020-01-28 DIAGNOSIS — M25552 Pain in left hip: Secondary | ICD-10-CM | POA: Diagnosis not present

## 2020-01-28 DIAGNOSIS — T8459XD Infection and inflammatory reaction due to other internal joint prosthesis, subsequent encounter: Secondary | ICD-10-CM | POA: Diagnosis not present

## 2020-01-29 DIAGNOSIS — D631 Anemia in chronic kidney disease: Secondary | ICD-10-CM | POA: Diagnosis not present

## 2020-01-29 DIAGNOSIS — Z4901 Encounter for fitting and adjustment of extracorporeal dialysis catheter: Secondary | ICD-10-CM | POA: Diagnosis not present

## 2020-01-29 DIAGNOSIS — N185 Chronic kidney disease, stage 5: Secondary | ICD-10-CM | POA: Diagnosis not present

## 2020-01-29 DIAGNOSIS — E1121 Type 2 diabetes mellitus with diabetic nephropathy: Secondary | ICD-10-CM | POA: Diagnosis not present

## 2020-01-29 DIAGNOSIS — D689 Coagulation defect, unspecified: Secondary | ICD-10-CM | POA: Diagnosis not present

## 2020-01-29 DIAGNOSIS — N186 End stage renal disease: Secondary | ICD-10-CM | POA: Diagnosis not present

## 2020-01-29 DIAGNOSIS — A499 Bacterial infection, unspecified: Secondary | ICD-10-CM | POA: Diagnosis not present

## 2020-01-29 DIAGNOSIS — Z992 Dependence on renal dialysis: Secondary | ICD-10-CM | POA: Diagnosis not present

## 2020-01-29 DIAGNOSIS — N2581 Secondary hyperparathyroidism of renal origin: Secondary | ICD-10-CM | POA: Diagnosis not present

## 2020-01-30 ENCOUNTER — Other Ambulatory Visit: Payer: Self-pay

## 2020-01-30 ENCOUNTER — Ambulatory Visit: Payer: Medicare Other | Admitting: Physical Therapy

## 2020-01-30 ENCOUNTER — Encounter: Payer: Self-pay | Admitting: Physical Therapy

## 2020-01-30 DIAGNOSIS — R29898 Other symptoms and signs involving the musculoskeletal system: Secondary | ICD-10-CM | POA: Diagnosis not present

## 2020-01-30 DIAGNOSIS — M25552 Pain in left hip: Secondary | ICD-10-CM | POA: Diagnosis not present

## 2020-01-30 NOTE — Therapy (Signed)
[facility] [place], [place], [address] Phone: [phone]   Fax:  [phone]  Physical Therapy Treatment  Patient Details  Name: [name] MRN: [id number] Date of Birth: 1941/01/23 Referring Provider (PT): Mayer Masker MD   Encounter Date: 01/30/2020   PT End of Session - 01/30/20 1020    Visit Number 4    Number of Visits 16    Date for PT Re-Evaluation 04/15/20    Authorization Type FOTO.    PT Start Time 1015    PT Stop Time 1100    PT Time Calculation (min) 45 min    Activity Tolerance Patient tolerated treatment well    Behavior During Therapy WFL for tasks assessed/performed           Past Medical History:  Diagnosis Date  . Acute respiratory failure ([place]) 05/2017  . Anemia   . Anxiety   . Arthritis   . COPD (chronic obstructive pulmonary disease) ([place])   . Depression   . ESRD (end stage renal disease) ([place])    dialysis MWF  . GERD (gastroesophageal reflux disease)   . Gout   . History of blood transfusion   . History of diabetes mellitus    "diet controlled"  . HLD (hyperlipidemia)   . HOH (hard of hearing)    left ear  . Hypertension    hypotensive -since starting dialysis  . Hypothyroidism   . MRSA (methicillin resistant staph aureus) culture positive 06/01/2017  . PAF (paroxysmal atrial fibrillation) ([place])    a. Echo 11/16:  Mild LVH, EF 55-60%, normal wall motion, MAC, mild MR, severe LAE (49 ml/m2), mild RVE, normal RVSF, mild RAE, mild TR, PASP 24 mmHg;  CHADS2-VASc: 4 >> Coumadin followed by PCP    Past Surgical History:  Procedure Laterality Date  . AV FISTULA PLACEMENT  04/03/2012   Procedure: ARTERIOVENOUS (AV) FISTULA CREATION;  Surgeon: Elam Dutch, MD;  Location: [place];  Service: Vascular;  Laterality: Left;  creation left brachial cephalic fistula   . BIOPSY  08/21/2018   Procedure: BIOPSY;  Surgeon: Thornton Park, MD;  Location: [place];  Service:  Gastroenterology;;  . CARPAL TUNNEL RELEASE Bilateral   . COLONOSCOPY W/ POLYPECTOMY    . COLONOSCOPY WITH PROPOFOL N/A 08/21/2018   Procedure: COLONOSCOPY WITH PROPOFOL;  Surgeon: Thornton Park, MD;  Location: [place];  Service: Gastroenterology;  Laterality: N/A;  . DILATION AND CURETTAGE OF UTERUS    . ESOPHAGOGASTRODUODENOSCOPY (EGD) WITH PROPOFOL N/A 08/21/2018   Procedure: ESOPHAGOGASTRODUODENOSCOPY (EGD) WITH PROPOFOL;  Surgeon: Thornton Park, MD;  Location: [facility];  Service: Gastroenterology;  Laterality: N/A;  . EYE SURGERY Bilateral    bilateral cataract removal  . HEMATOMA EVACUATION Left 05/14/2018   Procedure: Incision and Drainage of Left Arm Hematoma;  Surgeon: Elam Dutch, MD;  Location: [facility] OR;  Service: Vascular;  Laterality: Left;  . Hemodialysis  catheter Right   . IR FLUORO GUIDE CV LINE RIGHT  05/26/2017  . IR REMOVAL TUN CV CATH W/O FL  05/24/2017  . IR US GUIDE VASC ACCESS RIGHT  05/26/2017  . LIGATION OF ARTERIOVENOUS  FISTULA Left 02/04/2015   Procedure: LIGATION OF BRACHIOCEPHALIC ARTERIOVENOUS  FISTULA;  Surgeon: Conrad Deer Park, MD;  Location: [place];  Service: Vascular;  Laterality: Left;  Marland Kitchen MULTIPLE TOOTH EXTRACTIONS    . POLYPECTOMY  08/21/2018   Procedure: POLYPECTOMY;  Surgeon: Thornton Park, MD;  Location: [place];  Service: Gastroenterology;;  . PORTACATH PLACEMENT    .  REVERSE SHOULDER ARTHROPLASTY Left 01/22/2016   Procedure: LEFT REVERSE SHOULDER ARTHROPLASTY;  Surgeon: Netta Cedars, MD;  Location: [employer];  Service: Orthopedics;  Laterality: Left;  . SHOULDER ARTHROSCOPY Bilateral   . SPLIT NIGHT STUDY  07/26/2015  . STERIOD INJECTION Right 01/22/2016   Procedure: RIGHT RING FINGER STEROID INJECTION;  Surgeon: Netta Cedars, MD;  Location: [place];  Service: Orthopedics;  Laterality: Right;  . TEE WITHOUT CARDIOVERSION N/A 05/26/2017   Procedure: TRANSESOPHAGEAL ECHOCARDIOGRAM (TEE);  Surgeon: Lelon Perla, MD;  Location: [facility];  Service: Cardiovascular;  Laterality: N/A;  . THROMBECTOMY BRACHIAL ARTERY Left 02/06/2015   Procedure: EVACUATION OF LEFT ARM HEMATOMA;  Surgeon: Angelia Mould, MD;  Location: [facility];  Service: Vascular;  Laterality: Left;  . TOTAL KNEE ARTHROPLASTY Bilateral   . TUBAL LIGATION      There were no vitals filed for this visit.   Subjective Assessment - 01/30/20 1020    Subjective COVID-19 screen performed prior to patient entering clinic.  Pt arriving with daughter in law reporting no pain.    Pertinent History PAF, HTN, Hypothyroidism, bilateral ankle braces, bilateral shoulder and knee surgeries, OP (patient reports), DM, Dialysis treatments, OA, HOH.    How long can you walk comfortably? Around home with a FWW WBAT over her left LE.    Patient Stated Goals Get around better.    Currently in Pain? No/denies                             [facility] Adult PT Treatment/Exercise - 01/30/20 0001      Exercises   Exercises Knee/Hip      Knee/Hip Exercises: Aerobic   Nustep L2 x10 mins  seat 9   hip precautions     Knee/Hip Exercises: Standing   Hip Flexion AROM;Both;3 sets;10 reps    Hip Abduction AROM;Both;3 sets;10 reps    Forward Step Up 2 sets;10 reps;Hand Hold: 2;Limitations    Forward Step Up Limitations 1 inch step    Other Standing Knee Exercises Airex: feet together, feet apart, 1/2 tandum stance using UE support intermittently and CGA for safety.     Other Standing Knee Exercises tapping step x 20,      Knee/Hip Exercises: Seated   Long Arc Quad Left;3 sets;10 reps    Long Arc Quad Weight 2 lbs.    Ball Squeeze 3x10    Clamshell with TheraBand Red   3x10 hold 5 secs    Hamstring Curl 15 reps;Limitations    Hamstring Limitations red theraband    Sit to Sand 2 sets;5 reps;with UE support                       PT Long Term Goals - 01/30/20 1023      PT LONG TERM GOAL #1   Title Independent with an HEP.    Status On-going       PT LONG TERM GOAL #2   Title Perform a reciprocating stair gait with one railing.    Status On-going      PT LONG TERM GOAL #3   Title Walk in clinic 500 feet withno more than supervision.    Status On-going      PT LONG TERM GOAL #4   Title Left hip strength to a solid 4 to 4+/5 to increase stability for functional tasks.    Status On-going  Plan - 01/30/20 1021    Clinical Impression Statement Pt arriving today reporting no pain. Pt tolerating exercises well progressing toward more strengthening and balance requiring SBA to CGA and verbal instructions for techniques. Conitnue skilled PT progressing toward goals set.    Personal Factors and Comorbidities Comorbidity 1;Comorbidity 2    Comorbidities PAF, HTN, Hypothyroidism, bilateral ankle braces, bilateral shoulder and knee surgeries, OP (patient reports), DM, Dialysis treatments, OA, HOH.    Examination-Activity Limitations Locomotion Level;Transfers;Other    Stability/Clinical Decision Making Stable/Uncomplicated    Rehab Potential Good    PT Frequency 2x / week    PT Duration 8 weeks    PT Treatment/Interventions ADLs/Self Care Home Management;Functional mobility training;Stair training;Gait training;Therapeutic activities;Therapeutic exercise;Neuromuscular re-education    PT Next Visit Plan Left hip precautions:  Nustep, GT, Parallel bar activites, LE ther ex.           Patient will benefit from skilled therapeutic intervention in order to improve the following deficits and impairments:  Pain, Abnormal gait, Decreased activity tolerance, Decreased strength  Visit Diagnosis: Pain in left hip  Weakness of left hip     Problem List Patient Active Problem List   Diagnosis Date Noted  . Open wound of left hip 12/19/2019  . Educated about COVID-19 virus infection 08/06/2019  . Controlled substance agreement signed 05/16/2019  . Arthritis 02/14/2019  . Chronic midline thoracic back pain  02/14/2019  . Restless leg syndrome 10/26/2018  . Vitamin D deficiency 10/26/2018  . Age-related osteoporosis without current pathological fracture 10/26/2018  . Hypertension   . Gout   . COPD (chronic obstructive pulmonary disease) ([place])   . Anxiety   . 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 Adenomatous polyp of descending colon   . Adenomatous polyp of transverse colon   . Depression 08/19/2018  . Pernicious anemia 12/12/2017  . Moderate protein-calorie malnutrition ([place]) 06/02/2017  . Gastroesophageal reflux disease without esophagitis   . Dyslipidemia 02/09/2016  . Do not resuscitate 03/31/2015  . PAF (paroxysmal atrial fibrillation) ([place]) 02/04/2015  . Hypothyroidism   . Anemia in chronic kidney disease 12/09/2014  . Steal syndrome dialysis vascular access ([place]) 12/03/2014  . Secondary hyperparathyroidism of renal origin ([place]) 11/17/2014  . Physical deconditioning 08/04/2013  . Chronic kidney disease due to type 2 diabetes mellitus ([place]) 07/26/2013  . ESRD (end stage renal disease) on dialysis [facility]) 03/15/2012    Oretha Caprice, PT, MPT 01/30/2020, 11:07 AM  [facility] [address] [place], [place], [address] Phone: [phone]   Fax:  [phone]  Name: [name] MRN: [id number] Date of Birth: Aug 22, 1940

## 2020-01-31 ENCOUNTER — Other Ambulatory Visit: Payer: Self-pay | Admitting: Family Medicine

## 2020-01-31 DIAGNOSIS — Z992 Dependence on renal dialysis: Secondary | ICD-10-CM | POA: Diagnosis not present

## 2020-01-31 DIAGNOSIS — Z4901 Encounter for fitting and adjustment of extracorporeal dialysis catheter: Secondary | ICD-10-CM | POA: Diagnosis not present

## 2020-01-31 DIAGNOSIS — N186 End stage renal disease: Secondary | ICD-10-CM | POA: Diagnosis not present

## 2020-01-31 DIAGNOSIS — E1121 Type 2 diabetes mellitus with diabetic nephropathy: Secondary | ICD-10-CM | POA: Diagnosis not present

## 2020-01-31 DIAGNOSIS — N185 Chronic kidney disease, stage 5: Secondary | ICD-10-CM | POA: Diagnosis not present

## 2020-01-31 DIAGNOSIS — D631 Anemia in chronic kidney disease: Secondary | ICD-10-CM | POA: Diagnosis not present

## 2020-01-31 DIAGNOSIS — N2581 Secondary hyperparathyroidism of renal origin: Secondary | ICD-10-CM | POA: Diagnosis not present

## 2020-01-31 DIAGNOSIS — D689 Coagulation defect, unspecified: Secondary | ICD-10-CM | POA: Diagnosis not present

## 2020-01-31 DIAGNOSIS — A499 Bacterial infection, unspecified: Secondary | ICD-10-CM | POA: Diagnosis not present

## 2020-01-31 DIAGNOSIS — G2581 Restless legs syndrome: Secondary | ICD-10-CM

## 2020-02-02 DIAGNOSIS — Z992 Dependence on renal dialysis: Secondary | ICD-10-CM | POA: Diagnosis not present

## 2020-02-02 DIAGNOSIS — E1122 Type 2 diabetes mellitus with diabetic chronic kidney disease: Secondary | ICD-10-CM | POA: Diagnosis not present

## 2020-02-02 DIAGNOSIS — N186 End stage renal disease: Secondary | ICD-10-CM | POA: Diagnosis not present

## 2020-02-03 DIAGNOSIS — N186 End stage renal disease: Secondary | ICD-10-CM | POA: Diagnosis not present

## 2020-02-03 DIAGNOSIS — D689 Coagulation defect, unspecified: Secondary | ICD-10-CM | POA: Diagnosis not present

## 2020-02-03 DIAGNOSIS — Z992 Dependence on renal dialysis: Secondary | ICD-10-CM | POA: Diagnosis not present

## 2020-02-03 DIAGNOSIS — N2581 Secondary hyperparathyroidism of renal origin: Secondary | ICD-10-CM | POA: Diagnosis not present

## 2020-02-03 DIAGNOSIS — Z4901 Encounter for fitting and adjustment of extracorporeal dialysis catheter: Secondary | ICD-10-CM | POA: Diagnosis not present

## 2020-02-03 DIAGNOSIS — D631 Anemia in chronic kidney disease: Secondary | ICD-10-CM | POA: Diagnosis not present

## 2020-02-04 ENCOUNTER — Encounter: Payer: Self-pay | Admitting: Physical Therapy

## 2020-02-04 ENCOUNTER — Other Ambulatory Visit: Payer: Self-pay

## 2020-02-04 ENCOUNTER — Ambulatory Visit: Payer: Medicare Other | Attending: Orthopedic Surgery | Admitting: Physical Therapy

## 2020-02-04 DIAGNOSIS — R29898 Other symptoms and signs involving the musculoskeletal system: Secondary | ICD-10-CM | POA: Diagnosis not present

## 2020-02-04 DIAGNOSIS — M25552 Pain in left hip: Secondary | ICD-10-CM

## 2020-02-04 NOTE — Therapy (Signed)
[facility] [place], [place], [address] Phone: [phone]   Fax:  [phone]  Physical Therapy Treatment  Patient Details  Name: [name] MRN: [id number] Date of Birth: Mar 20, 1941 Referring Provider (PT): Mayer Masker MD   Encounter Date: 02/04/2020   PT End of Session - 02/04/20 1034    Visit Number 5    Number of Visits 16    Date for PT Re-Evaluation 04/15/20    Authorization Type FOTO.    PT Start Time 1032    PT Stop Time 1115    PT Time Calculation (min) 43 min    Equipment Utilized During Treatment Other (comment)   FWW   Activity Tolerance Patient tolerated treatment well    Behavior During Therapy WFL for tasks assessed/performed           Past Medical History:  Diagnosis Date  . Acute respiratory failure ([place]) 05/2017  . Anemia   . Anxiety   . Arthritis   . COPD (chronic obstructive pulmonary disease) ([place])   . Depression   . ESRD (end stage renal disease) ([place])    dialysis MWF  . GERD (gastroesophageal reflux disease)   . Gout   . History of blood transfusion   . History of diabetes mellitus    "diet controlled"  . HLD (hyperlipidemia)   . HOH (hard of hearing)    left ear  . Hypertension    hypotensive -since starting dialysis  . Hypothyroidism   . MRSA (methicillin resistant staph aureus) culture positive 06/01/2017  . PAF (paroxysmal atrial fibrillation) ([facility])    a. Echo 11/16:  Mild LVH, EF 55-60%, normal wall motion, MAC, mild MR, severe LAE (49 ml/m2), mild RVE, normal RVSF, mild RAE, mild TR, PASP 24 mmHg;  CHADS2-VASc: 4 >> Coumadin followed by PCP    Past Surgical History:  Procedure Laterality Date  . AV FISTULA PLACEMENT  04/03/2012   Procedure: ARTERIOVENOUS (AV) FISTULA CREATION;  Surgeon: Elam Dutch, MD;  Location: [facility];  Service: Vascular;  Laterality: Left;  creation left brachial cephalic fistula   . BIOPSY  08/21/2018   Procedure: BIOPSY;  Surgeon: Thornton Park, MD;  Location: [facility];  Service: Gastroenterology;;  . CARPAL TUNNEL RELEASE Bilateral   . COLONOSCOPY W/ POLYPECTOMY    . COLONOSCOPY WITH PROPOFOL N/A 08/21/2018   Procedure: COLONOSCOPY WITH PROPOFOL;  Surgeon: Thornton Park, MD;  Location: [facility];  Service: Gastroenterology;  Laterality: N/A;  . DILATION AND CURETTAGE OF UTERUS    . ESOPHAGOGASTRODUODENOSCOPY (EGD) WITH PROPOFOL N/A 08/21/2018   Procedure: ESOPHAGOGASTRODUODENOSCOPY (EGD) WITH PROPOFOL;  Surgeon: Thornton Park, MD;  Location: [facility];  Service: Gastroenterology;  Laterality: N/A;  . EYE SURGERY Bilateral    bilateral cataract removal  . HEMATOMA EVACUATION Left 05/14/2018   Procedure: Incision and Drainage of Left Arm Hematoma;  Surgeon: Elam Dutch, MD;  Location: [facility] OR;  Service: Vascular;  Laterality: Left;  . Hemodialysis  catheter Right   . IR FLUORO GUIDE CV LINE RIGHT  05/26/2017  . IR REMOVAL TUN CV CATH W/O FL  05/24/2017  . IR US GUIDE VASC ACCESS RIGHT  05/26/2017  . LIGATION OF ARTERIOVENOUS  FISTULA Left 02/04/2015   Procedure: LIGATION OF BRACHIOCEPHALIC ARTERIOVENOUS  FISTULA;  Surgeon: Conrad Celina, MD;  Location: [facility];  Service: Vascular;  Laterality: Left;  Marland Kitchen MULTIPLE TOOTH EXTRACTIONS    . POLYPECTOMY  08/21/2018   Procedure: POLYPECTOMY;  Surgeon: Thornton Park, MD;  Location: [facility];  Service: Gastroenterology;;  . PORTACATH PLACEMENT    . REVERSE SHOULDER ARTHROPLASTY Left 01/22/2016   Procedure: LEFT REVERSE SHOULDER ARTHROPLASTY;  Surgeon: Netta Cedars, MD;  Location: [facility];  Service: Orthopedics;  Laterality: Left;  . SHOULDER ARTHROSCOPY Bilateral   . SPLIT NIGHT STUDY  07/26/2015  . STERIOD INJECTION Right 01/22/2016   Procedure: RIGHT RING FINGER STEROID INJECTION;  Surgeon: Netta Cedars, MD;  Location: [facility];  Service: Orthopedics;  Laterality: Right;  . TEE WITHOUT CARDIOVERSION N/A 05/26/2017   Procedure: TRANSESOPHAGEAL ECHOCARDIOGRAM (TEE);   Surgeon: Lelon Perla, MD;  Location: [facility];  Service: Cardiovascular;  Laterality: N/A;  . THROMBECTOMY BRACHIAL ARTERY Left 02/06/2015   Procedure: EVACUATION OF LEFT ARM HEMATOMA;  Surgeon: Angelia Mould, MD;  Location: [facility];  Service: Vascular;  Laterality: Left;  . TOTAL KNEE ARTHROPLASTY Bilateral   . TUBAL LIGATION      There were no vitals filed for this visit.   Subjective Assessment - 02/04/20 1033    Subjective COVID-19 screen performed prior to patient entering clinic.  Patient denies any new challenges at home and no pain.    Patient is accompained by: Family member    Pertinent History PAF, HTN, Hypothyroidism, bilateral ankle braces, bilateral shoulder and knee surgeries, OP (patient reports), DM, Dialysis treatments, OA, HOH.    How long can you walk comfortably? Around home with a FWW WBAT over her left LE.    Patient Stated Goals Get around better.    Currently in Pain? No/denies              [facility] PT Assessment - 02/04/20 0001      Assessment   Medical Diagnosis Left hip hemiarthroplasty.    Referring Provider (PT) Mayer Masker MD    Onset Date/Surgical Date 11/30/19    Next MD Visit 02/28/2020      Precautions   Precaution Comments Left hip precautions.  Monitored walking at all times.                         [facility] Adult PT Treatment/Exercise - 02/04/20 0001      Knee/Hip Exercises: Aerobic   Nustep L1 x15 min      Knee/Hip Exercises: Seated   Long Arc Quad Left;3 sets;10 reps    Long Arc Quad Weight 3 lbs.    Ball Squeeze 3x10    Clamshell with TheraBand Red   x20 reps   Other Seated Knee/Hip Exercises B heel/toe raise x20 reps each    Marching Strengthening;Both;2 sets;10 reps;Limitations    Marching Limitations red theraband    Hamstring Curl Strengthening;Both;2 sets;10 reps;Limitations    Hamstring Limitations red theraband    Sit to Sand 15 reps;with UE support                       PT  Long Term Goals - 01/30/20 1023      PT LONG TERM GOAL #1   Title Independent with an HEP.    Status On-going      PT LONG TERM GOAL #2   Title Perform a reciprocating stair gait with one railing.    Status On-going      PT LONG TERM GOAL #3   Title Walk in clinic 500 feet withno more than supervision.    Status On-going      PT LONG TERM GOAL #4   Title Left hip strength to  a solid 4 to 4+/5 to increase stability for functional tasks.    Status On-going                 Plan - 02/04/20 1152    Clinical Impression Statement Patient presented in clinic with no new complaints. Patient able to progress through more reps and strengthening without complaint of pain or fatigue. Patient denied any pain or fatigue following end of treatment.    Personal Factors and Comorbidities Comorbidity 1;Comorbidity 2    Comorbidities PAF, HTN, Hypothyroidism, bilateral ankle braces, bilateral shoulder and knee surgeries, OP (patient reports), DM, Dialysis treatments, OA, HOH.    Examination-Activity Limitations Locomotion Level;Transfers;Other    Stability/Clinical Decision Making Stable/Uncomplicated    Rehab Potential Good    PT Frequency 2x / week    PT Duration 8 weeks    PT Treatment/Interventions ADLs/Self Care Home Management;Functional mobility training;Stair training;Gait training;Therapeutic activities;Therapeutic exercise;Neuromuscular re-education    PT Next Visit Plan Left hip precautions:  Nustep, GT, Parallel bar activites, LE ther ex.    Consulted and Agree with Plan of Care Patient           Patient will benefit from skilled therapeutic intervention in order to improve the following deficits and impairments:  Pain, Abnormal gait, Decreased activity tolerance, Decreased strength  Visit Diagnosis: Pain in left hip  Weakness of left hip     Problem List Patient Active Problem List   Diagnosis Date Noted  . Open wound of left hip 12/19/2019  . Educated about  COVID-19 virus infection 08/06/2019  . Controlled substance agreement signed 05/16/2019  . Arthritis 02/14/2019  . Chronic midline thoracic back pain 02/14/2019  . Restless leg syndrome 10/26/2018  . Vitamin D deficiency 10/26/2018  . Age-related osteoporosis without current pathological fracture 10/26/2018  . Hypertension   . Gout   . COPD (chronic obstructive pulmonary disease) ([place])   . Anxiety   . 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 Adenomatous polyp of descending colon   . Adenomatous polyp of transverse colon   . Depression 08/19/2018  . Pernicious anemia 12/12/2017  . Moderate protein-calorie malnutrition ([place]) 06/02/2017  . Gastroesophageal reflux disease without esophagitis   . Dyslipidemia 02/09/2016  . Do not resuscitate 03/31/2015  . PAF (paroxysmal atrial fibrillation) ([place]) 02/04/2015  . Hypothyroidism   . Anemia in chronic kidney disease 12/09/2014  . Steal syndrome dialysis vascular access ([place]) 12/03/2014  . Secondary hyperparathyroidism of renal origin ([place]) 11/17/2014  . Physical deconditioning 08/04/2013  . Chronic kidney disease due to type 2 diabetes mellitus ([place]) 07/26/2013  . ESRD (end stage renal disease) on dialysis [facility]) 03/15/2012    Standley Brooking, PTA 02/04/2020, 12:16 PM  [facility] [address] [place], [place], [address] Phone: [phone]   Fax:  [phone]  Name: [name] MRN: [id number] Date of Birth: [birth year]

## 2020-02-05 DIAGNOSIS — Z4901 Encounter for fitting and adjustment of extracorporeal dialysis catheter: Secondary | ICD-10-CM | POA: Diagnosis not present

## 2020-02-05 DIAGNOSIS — N2581 Secondary hyperparathyroidism of renal origin: Secondary | ICD-10-CM | POA: Diagnosis not present

## 2020-02-05 DIAGNOSIS — D689 Coagulation defect, unspecified: Secondary | ICD-10-CM | POA: Diagnosis not present

## 2020-02-05 DIAGNOSIS — D631 Anemia in chronic kidney disease: Secondary | ICD-10-CM | POA: Diagnosis not present

## 2020-02-05 DIAGNOSIS — N186 End stage renal disease: Secondary | ICD-10-CM | POA: Diagnosis not present

## 2020-02-05 DIAGNOSIS — Z992 Dependence on renal dialysis: Secondary | ICD-10-CM | POA: Diagnosis not present

## 2020-02-06 ENCOUNTER — Ambulatory Visit: Payer: Medicare Other | Admitting: Physical Therapy

## 2020-02-06 ENCOUNTER — Other Ambulatory Visit: Payer: Self-pay

## 2020-02-06 DIAGNOSIS — R29898 Other symptoms and signs involving the musculoskeletal system: Secondary | ICD-10-CM

## 2020-02-06 DIAGNOSIS — M25552 Pain in left hip: Secondary | ICD-10-CM

## 2020-02-06 NOTE — Therapy (Signed)
[facility] [place], [place], [address] Phone: [phone]   Fax:  [phone]  Physical Therapy Treatment  Patient Details  Name: [name] MRN: [id number] Date of Birth: 01-23-1941 Referring Provider (PT): Mayer Masker MD   Encounter Date: 02/06/2020   PT End of Session - 02/06/20 1040    Visit Number 6    Number of Visits 16    Date for PT Re-Evaluation 04/15/20    Authorization Type FOTO.    PT Start Time 1036    PT Stop Time 1117    PT Time Calculation (min) 41 min    Activity Tolerance Patient tolerated treatment well    Behavior During Therapy WFL for tasks assessed/performed           Past Medical History:  Diagnosis Date   Acute respiratory failure ([place]) 05/2017   Anemia    Anxiety    Arthritis    COPD (chronic obstructive pulmonary disease) (HCC)    Depression    ESRD (end stage renal disease) ([place])    dialysis MWF   GERD (gastroesophageal reflux disease)    Gout    History of blood transfusion    History of diabetes mellitus    "diet controlled"   HLD (hyperlipidemia)    HOH (hard of hearing)    left ear   Hypertension    hypotensive -since starting dialysis   Hypothyroidism    MRSA (methicillin resistant staph aureus) culture positive 06/01/2017   PAF (paroxysmal atrial fibrillation) ([place])    a. Echo 11/16:  Mild LVH, EF 55-60%, normal wall motion, MAC, mild MR, severe LAE (49 ml/m2), mild RVE, normal RVSF, mild RAE, mild TR, PASP 24 mmHg;  CHADS2-VASc: 4 >> Coumadin followed by PCP    Past Surgical History:  Procedure Laterality Date   AV FISTULA PLACEMENT  04/03/2012   Procedure: ARTERIOVENOUS (AV) FISTULA CREATION;  Surgeon: Elam Dutch, MD;  Location: [facility];  Service: Vascular;  Laterality: Left;  creation left brachial cephalic fistula    BIOPSY  08/21/2018   Procedure: BIOPSY;  Surgeon: Thornton Park, MD;  Location: [facility];  Service:  Gastroenterology;;   CARPAL TUNNEL RELEASE Bilateral    COLONOSCOPY W/ POLYPECTOMY     COLONOSCOPY WITH PROPOFOL N/A 08/21/2018   Procedure: COLONOSCOPY WITH PROPOFOL;  Surgeon: Thornton Park, MD;  Location: [facility];  Service: Gastroenterology;  Laterality: N/A;   DILATION AND CURETTAGE OF UTERUS     ESOPHAGOGASTRODUODENOSCOPY (EGD) WITH PROPOFOL N/A 08/21/2018   Procedure: ESOPHAGOGASTRODUODENOSCOPY (EGD) WITH PROPOFOL;  Surgeon: Thornton Park, MD;  Location: [facility];  Service: Gastroenterology;  Laterality: N/A;   EYE SURGERY Bilateral    bilateral cataract removal   HEMATOMA EVACUATION Left 05/14/2018   Procedure: Incision and Drainage of Left Arm Hematoma;  Surgeon: Elam Dutch, MD;  Location: [facility];  Service: Vascular;  Laterality: Left;   Hemodialysis  catheter Right    IR FLUORO GUIDE CV LINE RIGHT  05/26/2017   IR REMOVAL TUN CV CATH W/O FL  05/24/2017   IR US GUIDE VASC ACCESS RIGHT  05/26/2017   LIGATION OF ARTERIOVENOUS  FISTULA Left 02/04/2015   Procedure: LIGATION OF BRACHIOCEPHALIC ARTERIOVENOUS  FISTULA;  Surgeon: Conrad , MD;  Location: [facility];  Service: Vascular;  Laterality: Left;   MULTIPLE TOOTH EXTRACTIONS     POLYPECTOMY  08/21/2018   Procedure: POLYPECTOMY;  Surgeon: Thornton Park, MD;  Location: [facility];  Service: Gastroenterology;;   PORTACATH PLACEMENT  REVERSE SHOULDER ARTHROPLASTY Left 01/22/2016   Procedure: LEFT REVERSE SHOULDER ARTHROPLASTY;  Surgeon: Netta Cedars, MD;  Location: [facility];  Service: Orthopedics;  Laterality: Left;   SHOULDER ARTHROSCOPY Bilateral    SPLIT NIGHT STUDY  07/26/2015   STERIOD INJECTION Right 01/22/2016   Procedure: RIGHT RING FINGER STEROID INJECTION;  Surgeon: Netta Cedars, MD;  Location: [facility];  Service: Orthopedics;  Laterality: Right;   TEE WITHOUT CARDIOVERSION N/A 05/26/2017   Procedure: TRANSESOPHAGEAL ECHOCARDIOGRAM (TEE);  Surgeon: Lelon Perla, MD;  Location: [place] [facility];  Service: Cardiovascular;  Laterality: N/A;   THROMBECTOMY BRACHIAL ARTERY Left 02/06/2015   Procedure: EVACUATION OF LEFT ARM HEMATOMA;  Surgeon: Angelia Mould, MD;  Location: [facility];  Service: Vascular;  Laterality: Left;   TOTAL KNEE ARTHROPLASTY Bilateral    TUBAL LIGATION      There were no vitals filed for this visit.   Subjective Assessment - 02/06/20 1039    Subjective COVID-19 screen performed prior to patient entering clinic.  Patient reported no pain and did well after last treatment.    Pertinent History PAF, HTN, Hypothyroidism, bilateral ankle braces, bilateral shoulder and knee surgeries, OP (patient reports), DM, Dialysis treatments, OA, HOH.    How long can you walk comfortably? Around home with a FWW WBAT over her left LE.    Patient Stated Goals Get around better.    Currently in Pain? No/denies                             [facility] Adult PT Treatment/Exercise - 02/06/20 0001      Knee/Hip Exercises: Aerobic   Nustep L2 x65mn UE/LE      Knee/Hip Exercises: Standing   Heel Raises Both;1 set;10 reps    Knee Flexion AROM;Strengthening;Both;2 sets;10 reps    Hip Flexion Stengthening;Both;1 set;10 reps;Knee straight    Hip Abduction AROM;Stengthening;Both;1 set;10 reps;Knee straight    Forward Step Up Left;1 set;10 reps;Step Height: 4";Hand Hold: 2      Knee/Hip Exercises: Seated   Long Arc Quad 3 sets;10 reps;Both    Long Arc Quad Weight 3 lbs.    Clamshell with TheraBand Red   x20   Marching Strengthening;Both;2 sets;10 reps;Limitations    Marching Limitations red theraband    Hamstring Curl Strengthening;Both;20 reps    Hamstring Limitations red theraband    Sit to Sand with UE support;10 reps                       PT Long Term Goals - 02/06/20 1041      PT LONG TERM GOAL #1   Title Independent with an HEP.    Time 8    Status On-going      PT LONG TERM GOAL #2   Title Perform a reciprocating stair gait  with one railing.    Baseline not performing stairs at this time 02/06/20    Time 8    Period Weeks    Status On-going      PT LONG TERM GOAL #3   Title Walk in clinic 500 feet with no more than supervision.    Baseline [facility]    Time 8    Period Weeks    Status On-going      PT LONG TERM GOAL #4   Title Left hip strength to a solid 4 to 4+/5 to increase stability for  functional tasks.    Baseline NT 02/06/20    Time 8    Period Weeks    Status On-going                 Plan - 02/06/20 1056    Clinical Impression Statement Patient tolerated treatment well today. patient progressing with no pain reported and able to progress with LE strengthening. Patient continues to use FWW for ambulation yet is limited with distance at this time and is not currently using any steps. Patient progressing with standing activities. Goals progressing this week.    Personal Factors and Comorbidities Comorbidity 1;Comorbidity 2    Comorbidities PAF, HTN, Hypothyroidism, bilateral ankle braces, bilateral shoulder and knee surgeries, OP (patient reports), DM, Dialysis treatments, OA, HOH.    Examination-Activity Limitations Locomotion Level;Transfers;Other    Stability/Clinical Decision Making Stable/Uncomplicated    Rehab Potential Good    PT Frequency 2x / week    PT Duration 8 weeks    PT Treatment/Interventions ADLs/Self Care Home Management;Functional mobility training;Stair training;Gait training;Therapeutic activities;Therapeutic exercise;Neuromuscular re-education    PT Next Visit Plan Left hip precautions:  Nustep, GT, Parallel bar activites, LE ther ex.    Consulted and Agree with Plan of Care Patient           Patient will benefit from skilled therapeutic intervention in order to improve the following deficits and impairments:  Pain, Abnormal gait, Decreased activity tolerance, Decreased strength  Visit Diagnosis: Pain in left  hip  Weakness of left hip     Problem List Patient Active Problem List   Diagnosis Date Noted   Open wound of left hip 12/19/2019   Educated about COVID-19 virus infection 08/06/2019   Controlled substance agreement signed 05/16/2019   Arthritis 02/14/2019   Chronic midline thoracic back pain 02/14/2019   Restless leg syndrome 10/26/2018   Vitamin D deficiency 10/26/2018   Age-related osteoporosis without current pathological fracture 10/26/2018   Hypertension    Gout    COPD (chronic obstructive pulmonary disease) ([place])    Anxiety    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    Adenomatous polyp of descending colon    Adenomatous polyp of transverse colon    Depression 08/19/2018   Pernicious anemia 12/12/2017   Moderate protein-calorie malnutrition ([place]) 06/02/2017   Gastroesophageal reflux disease without esophagitis    Dyslipidemia 02/09/2016   Do not resuscitate 03/31/2015   PAF (paroxysmal atrial fibrillation) ([place]) 02/04/2015   Hypothyroidism    Anemia in chronic kidney disease 12/09/2014   Steal syndrome dialysis vascular access ([place]) 12/03/2014   Secondary hyperparathyroidism of renal origin ([place]) 11/17/2014   Physical deconditioning 08/04/2013   Chronic kidney disease due to type 2 diabetes mellitus ([place]) 07/26/2013   ESRD (end stage renal disease) on dialysis ([place]) 03/15/2012    Rylea Selway P, PTA 02/06/2020, 11:19 AM  [facility] [address] [place], [place], [address] Phone: [phone]   Fax:  [phone]  Name: [name] MRN: [id number] Date of Birth: 1940-04-25

## 2020-02-07 DIAGNOSIS — D631 Anemia in chronic kidney disease: Secondary | ICD-10-CM | POA: Diagnosis not present

## 2020-02-07 DIAGNOSIS — Z992 Dependence on renal dialysis: Secondary | ICD-10-CM | POA: Diagnosis not present

## 2020-02-07 DIAGNOSIS — N186 End stage renal disease: Secondary | ICD-10-CM | POA: Diagnosis not present

## 2020-02-07 DIAGNOSIS — N2581 Secondary hyperparathyroidism of renal origin: Secondary | ICD-10-CM | POA: Diagnosis not present

## 2020-02-07 DIAGNOSIS — D689 Coagulation defect, unspecified: Secondary | ICD-10-CM | POA: Diagnosis not present

## 2020-02-07 DIAGNOSIS — Z4901 Encounter for fitting and adjustment of extracorporeal dialysis catheter: Secondary | ICD-10-CM | POA: Diagnosis not present

## 2020-02-10 DIAGNOSIS — D689 Coagulation defect, unspecified: Secondary | ICD-10-CM | POA: Diagnosis not present

## 2020-02-10 DIAGNOSIS — N2581 Secondary hyperparathyroidism of renal origin: Secondary | ICD-10-CM | POA: Diagnosis not present

## 2020-02-10 DIAGNOSIS — Z992 Dependence on renal dialysis: Secondary | ICD-10-CM | POA: Diagnosis not present

## 2020-02-10 DIAGNOSIS — D631 Anemia in chronic kidney disease: Secondary | ICD-10-CM | POA: Diagnosis not present

## 2020-02-10 DIAGNOSIS — N186 End stage renal disease: Secondary | ICD-10-CM | POA: Diagnosis not present

## 2020-02-10 DIAGNOSIS — Z4901 Encounter for fitting and adjustment of extracorporeal dialysis catheter: Secondary | ICD-10-CM | POA: Diagnosis not present

## 2020-02-11 ENCOUNTER — Other Ambulatory Visit: Payer: Self-pay

## 2020-02-11 ENCOUNTER — Encounter: Payer: Self-pay | Admitting: Physical Therapy

## 2020-02-11 ENCOUNTER — Ambulatory Visit: Payer: Medicare Other | Admitting: Physical Therapy

## 2020-02-11 DIAGNOSIS — Z23 Encounter for immunization: Secondary | ICD-10-CM | POA: Diagnosis not present

## 2020-02-11 DIAGNOSIS — J9811 Atelectasis: Secondary | ICD-10-CM | POA: Diagnosis not present

## 2020-02-11 DIAGNOSIS — Z043 Encounter for examination and observation following other accident: Secondary | ICD-10-CM | POA: Diagnosis not present

## 2020-02-11 DIAGNOSIS — S72002A Fracture of unspecified part of neck of left femur, initial encounter for closed fracture: Secondary | ICD-10-CM | POA: Diagnosis not present

## 2020-02-11 DIAGNOSIS — S62307A Unspecified fracture of fifth metacarpal bone, left hand, initial encounter for closed fracture: Secondary | ICD-10-CM | POA: Diagnosis not present

## 2020-02-11 DIAGNOSIS — S4992XA Unspecified injury of left shoulder and upper arm, initial encounter: Secondary | ICD-10-CM | POA: Diagnosis not present

## 2020-02-11 DIAGNOSIS — K5900 Constipation, unspecified: Secondary | ICD-10-CM | POA: Diagnosis not present

## 2020-02-11 DIAGNOSIS — I120 Hypertensive chronic kidney disease with stage 5 chronic kidney disease or end stage renal disease: Secondary | ICD-10-CM | POA: Diagnosis not present

## 2020-02-11 DIAGNOSIS — R29898 Other symptoms and signs involving the musculoskeletal system: Secondary | ICD-10-CM

## 2020-02-11 DIAGNOSIS — E559 Vitamin D deficiency, unspecified: Secondary | ICD-10-CM | POA: Diagnosis not present

## 2020-02-11 DIAGNOSIS — M9702XA Periprosthetic fracture around internal prosthetic left hip joint, initial encounter: Secondary | ICD-10-CM | POA: Diagnosis not present

## 2020-02-11 DIAGNOSIS — W1830XA Fall on same level, unspecified, initial encounter: Secondary | ICD-10-CM | POA: Diagnosis not present

## 2020-02-11 DIAGNOSIS — G2581 Restless legs syndrome: Secondary | ICD-10-CM | POA: Diagnosis not present

## 2020-02-11 DIAGNOSIS — E785 Hyperlipidemia, unspecified: Secondary | ICD-10-CM | POA: Diagnosis not present

## 2020-02-11 DIAGNOSIS — I480 Paroxysmal atrial fibrillation: Secondary | ICD-10-CM | POA: Diagnosis not present

## 2020-02-11 DIAGNOSIS — I959 Hypotension, unspecified: Secondary | ICD-10-CM | POA: Diagnosis not present

## 2020-02-11 DIAGNOSIS — J449 Chronic obstructive pulmonary disease, unspecified: Secondary | ICD-10-CM | POA: Diagnosis not present

## 2020-02-11 DIAGNOSIS — D62 Acute posthemorrhagic anemia: Secondary | ICD-10-CM | POA: Diagnosis not present

## 2020-02-11 DIAGNOSIS — I10 Essential (primary) hypertension: Secondary | ICD-10-CM | POA: Diagnosis not present

## 2020-02-11 DIAGNOSIS — S51812A Laceration without foreign body of left forearm, initial encounter: Secondary | ICD-10-CM | POA: Diagnosis not present

## 2020-02-11 DIAGNOSIS — D631 Anemia in chronic kidney disease: Secondary | ICD-10-CM | POA: Diagnosis not present

## 2020-02-11 DIAGNOSIS — W1789XA Other fall from one level to another, initial encounter: Secondary | ICD-10-CM | POA: Diagnosis not present

## 2020-02-11 DIAGNOSIS — S7222XA Displaced subtrochanteric fracture of left femur, initial encounter for closed fracture: Secondary | ICD-10-CM | POA: Diagnosis not present

## 2020-02-11 DIAGNOSIS — T84031A Mechanical loosening of internal left hip prosthetic joint, initial encounter: Secondary | ICD-10-CM | POA: Diagnosis not present

## 2020-02-11 DIAGNOSIS — G4730 Sleep apnea, unspecified: Secondary | ICD-10-CM | POA: Diagnosis not present

## 2020-02-11 DIAGNOSIS — E875 Hyperkalemia: Secondary | ICD-10-CM | POA: Diagnosis not present

## 2020-02-11 DIAGNOSIS — Z471 Aftercare following joint replacement surgery: Secondary | ICD-10-CM | POA: Diagnosis not present

## 2020-02-11 DIAGNOSIS — J9611 Chronic respiratory failure with hypoxia: Secondary | ICD-10-CM | POA: Diagnosis not present

## 2020-02-11 DIAGNOSIS — N186 End stage renal disease: Secondary | ICD-10-CM | POA: Diagnosis not present

## 2020-02-11 DIAGNOSIS — M25552 Pain in left hip: Secondary | ICD-10-CM | POA: Diagnosis not present

## 2020-02-11 DIAGNOSIS — E1122 Type 2 diabetes mellitus with diabetic chronic kidney disease: Secondary | ICD-10-CM | POA: Diagnosis not present

## 2020-02-11 DIAGNOSIS — Z992 Dependence on renal dialysis: Secondary | ICD-10-CM | POA: Diagnosis not present

## 2020-02-11 DIAGNOSIS — R0989 Other specified symptoms and signs involving the circulatory and respiratory systems: Secondary | ICD-10-CM | POA: Diagnosis not present

## 2020-02-11 DIAGNOSIS — E039 Hypothyroidism, unspecified: Secondary | ICD-10-CM | POA: Diagnosis not present

## 2020-02-11 DIAGNOSIS — I9581 Postprocedural hypotension: Secondary | ICD-10-CM | POA: Diagnosis not present

## 2020-02-11 DIAGNOSIS — M25512 Pain in left shoulder: Secondary | ICD-10-CM | POA: Diagnosis not present

## 2020-02-11 DIAGNOSIS — Z96642 Presence of left artificial hip joint: Secondary | ICD-10-CM | POA: Diagnosis not present

## 2020-02-11 DIAGNOSIS — M25562 Pain in left knee: Secondary | ICD-10-CM | POA: Diagnosis not present

## 2020-02-11 NOTE — Therapy (Signed)
[facility] [place], [place], [address] Phone: [phone]   Fax:  [phone]  Physical Therapy Treatment  Patient Details  Name: [name] MRN: [id number] Date of Birth: Apr 06, 1940 Referring Provider (PT): Mayer Masker MD   Encounter Date: 02/11/2020   PT End of Session - 02/11/20 1039    Visit Number 7    Number of Visits 16    Date for PT Re-Evaluation 04/15/20    Authorization Type FOTO.    PT Start Time 1030    PT Stop Time 1116    PT Time Calculation (min) 46 min    Activity Tolerance Patient tolerated treatment well    Behavior During Therapy WFL for tasks assessed/performed           Past Medical History:  Diagnosis Date  . Acute respiratory failure ([place]) 05/2017  . Anemia   . Anxiety   . Arthritis   . COPD (chronic obstructive pulmonary disease) ([place])   . Depression   . ESRD (end stage renal disease) ([place])    dialysis MWF  . GERD (gastroesophageal reflux disease)   . Gout   . History of blood transfusion   . History of diabetes mellitus    "diet controlled"  . HLD (hyperlipidemia)   . HOH (hard of hearing)    left ear  . Hypertension    hypotensive -since starting dialysis  . Hypothyroidism   . MRSA (methicillin resistant staph aureus) culture positive 06/01/2017  . PAF (paroxysmal atrial fibrillation) ([place])    a. Echo 11/16:  Mild LVH, EF 55-60%, normal wall motion, MAC, mild MR, severe LAE (49 ml/m2), mild RVE, normal RVSF, mild RAE, mild TR, PASP 24 mmHg;  CHADS2-VASc: 4 >> Coumadin followed by PCP    Past Surgical History:  Procedure Laterality Date  . AV FISTULA PLACEMENT  04/03/2012   Procedure: ARTERIOVENOUS (AV) FISTULA CREATION;  Surgeon: Elam Dutch, MD;  Location: [facility];  Service: Vascular;  Laterality: Left;  creation left brachial cephalic fistula   . BIOPSY  08/21/2018   Procedure: BIOPSY;  Surgeon: Thornton Park, MD;  Location: [facility];  Service:  Gastroenterology;;  . CARPAL TUNNEL RELEASE Bilateral   . COLONOSCOPY W/ POLYPECTOMY    . COLONOSCOPY WITH PROPOFOL N/A 08/21/2018   Procedure: COLONOSCOPY WITH PROPOFOL;  Surgeon: Thornton Park, MD;  Location: [facility];  Service: Gastroenterology;  Laterality: N/A;  . DILATION AND CURETTAGE OF UTERUS    . ESOPHAGOGASTRODUODENOSCOPY (EGD) WITH PROPOFOL N/A 08/21/2018   Procedure: ESOPHAGOGASTRODUODENOSCOPY (EGD) WITH PROPOFOL;  Surgeon: Thornton Park, MD;  Location: [facility];  Service: Gastroenterology;  Laterality: N/A;  . EYE SURGERY Bilateral    bilateral cataract removal  . HEMATOMA EVACUATION Left 05/14/2018   Procedure: Incision and Drainage of Left Arm Hematoma;  Surgeon: Elam Dutch, MD;  Location: [facility] OR;  Service: Vascular;  Laterality: Left;  . Hemodialysis  catheter Right   . IR FLUORO GUIDE CV LINE RIGHT  05/26/2017  . IR REMOVAL TUN CV CATH W/O FL  05/24/2017  . IR US GUIDE VASC ACCESS RIGHT  05/26/2017  . LIGATION OF ARTERIOVENOUS  FISTULA Left 02/04/2015   Procedure: LIGATION OF BRACHIOCEPHALIC ARTERIOVENOUS  FISTULA;  Surgeon: Conrad Ramsey, MD;  Location: [facility];  Service: Vascular;  Laterality: Left;  Marland Kitchen MULTIPLE TOOTH EXTRACTIONS    . POLYPECTOMY  08/21/2018   Procedure: POLYPECTOMY;  Surgeon: Thornton Park, MD;  Location: [facility];  Service: Gastroenterology;;  . PORTACATH PLACEMENT    .  REVERSE SHOULDER ARTHROPLASTY Left 01/22/2016   Procedure: LEFT REVERSE SHOULDER ARTHROPLASTY;  Surgeon: Netta Cedars, MD;  Location: [facility];  Service: Orthopedics;  Laterality: Left;  . SHOULDER ARTHROSCOPY Bilateral   . SPLIT NIGHT STUDY  07/26/2015  . STERIOD INJECTION Right 01/22/2016   Procedure: RIGHT RING FINGER STEROID INJECTION;  Surgeon: Netta Cedars, MD;  Location: [facility];  Service: Orthopedics;  Laterality: Right;  . TEE WITHOUT CARDIOVERSION N/A 05/26/2017   Procedure: TRANSESOPHAGEAL ECHOCARDIOGRAM (TEE);  Surgeon: Lelon Perla, MD;  Location: [facility];  Service: Cardiovascular;  Laterality: N/A;  . THROMBECTOMY BRACHIAL ARTERY Left 02/06/2015   Procedure: EVACUATION OF LEFT ARM HEMATOMA;  Surgeon: Angelia Mould, MD;  Location: [facility];  Service: Vascular;  Laterality: Left;  . TOTAL KNEE ARTHROPLASTY Bilateral   . TUBAL LIGATION      There were no vitals filed for this visit.   Subjective Assessment - 02/11/20 1039    Subjective COVID-19 screen performed prior to patient entering clinic. Patient arrived with no reports of pain. States she went up/down 4 steps with one rail and and hand held assist from her son.    Pertinent History PAF, HTN, Hypothyroidism, bilateral ankle braces, bilateral shoulder and knee surgeries, OP (patient reports), DM, Dialysis treatments, OA, HOH.    How long can you walk comfortably? Around home with a FWW WBAT over her left LE.    Patient Stated Goals Get around better.    Currently in Pain? No/denies              [facility] PT Assessment - 02/11/20 0001      Assessment   Medical Diagnosis Left hip hemiarthroplasty.    Referring Provider (PT) Mayer Masker MD    Onset Date/Surgical Date 11/30/19    Next MD Visit 02/28/2020      Precautions   Precaution Comments Left hip precautions.  Monitored walking at all times.                         [facility] Adult PT Treatment/Exercise - 02/11/20 0001      Knee/Hip Exercises: Aerobic   Nustep L3 x75mn UE/LE      Knee/Hip Exercises: Standing   Knee Flexion AROM;Strengthening;Both;2 sets;10 reps    Terminal Knee Extension Strengthening;Both;2 sets;10 reps;Theraband    Theraband Level (Terminal Knee Extension) Level 2 (Red)    Hip Abduction AROM;Stengthening;Both;2 sets;10 reps;Knee straight    Forward Step Up Both;1 set;5 reps;Hand Hold: 2;Step Height: 4"    Other Standing Knee Exercises lateral stepping x2 mins with bilateral UE support      Knee/Hip Exercises: Seated   Long Arc Quad 3 sets;10 reps;Both    Long Arc Quad  Weight 4 lbs.    Clamshell with TheraBand Red    Marching Strengthening;Both;2 sets;10 reps;Limitations    Marching Limitations red theraband    Hamstring Curl Strengthening;Both;20 reps    Hamstring Limitations red theraband                       PT Long Term Goals - 02/06/20 1041      PT LONG TERM GOAL #1   Title Independent with an HEP.    Time 8    Status On-going      PT LONG TERM GOAL #2   Title Perform a reciprocating stair gait with one railing.    Baseline not performing stairs at this time 02/06/20    Time  8    Period Weeks    Status On-going      PT LONG TERM GOAL #3   Title Walk in clinic 500 feet with no more than supervision.    Baseline limited to walking in the house currently with [facility] 02/06/20    Time 8    Period Weeks    Status On-going      PT LONG TERM GOAL #4   Title Left hip strength to a solid 4 to 4+/5 to increase stability for functional tasks.    Baseline NT 02/06/20    Time 8    Period Weeks    Status On-going                 Plan - 02/11/20 1146    Clinical Impression Statement Patient was able to complete treatment well with no reports of pain. Patient tolerated progression of strengthening and functional activities. Patient still with moderate weakness with step up requiring increased bilateral UE support on parallel bars to go up 4" step. Patient requested Mali Applegate, MPT to assess L ankle brace as she noticed a clicking noise while walking over the weekend but per MPT, everything looked place.    Personal Factors and Comorbidities Comorbidity 1;Comorbidity 2    Comorbidities PAF, HTN, Hypothyroidism, bilateral ankle braces, bilateral shoulder and knee surgeries, OP (patient reports), DM, Dialysis treatments, OA, HOH.    Examination-Activity Limitations Locomotion Level;Transfers;Other    Stability/Clinical Decision Making Stable/Uncomplicated    Rehab Potential Good    PT Frequency 2x / week    PT Duration 8 weeks     PT Treatment/Interventions ADLs/Self Care Home Management;Functional mobility training;Stair training;Gait training;Therapeutic activities;Therapeutic exercise;Neuromuscular re-education    PT Next Visit Plan Left hip precautions:  Nustep, GT, Parallel bar activites, LE ther ex.    Consulted and Agree with Plan of Care Patient           Patient will benefit from skilled therapeutic intervention in order to improve the following deficits and impairments:  Pain, Abnormal gait, Decreased activity tolerance, Decreased strength  Visit Diagnosis: Pain in left hip  Weakness of left hip     Problem List Patient Active Problem List   Diagnosis Date Noted  . Open wound of left hip 12/19/2019  . Educated about COVID-19 virus infection 08/06/2019  . Controlled substance agreement signed 05/16/2019  . Arthritis 02/14/2019  . Chronic midline thoracic back pain 02/14/2019  . Restless leg syndrome 10/26/2018  . Vitamin D deficiency 10/26/2018  . Age-related osteoporosis without current pathological fracture 10/26/2018  . Hypertension   . Gout   . COPD (chronic obstructive pulmonary disease) ([place])   . Anxiety   . 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 Adenomatous polyp of descending colon   . Adenomatous polyp of transverse colon   . Depression 08/19/2018  . Pernicious anemia 12/12/2017  . Moderate protein-calorie malnutrition ([place]) 06/02/2017  . Gastroesophageal reflux disease without esophagitis   . Dyslipidemia 02/09/2016  . Do not resuscitate 03/31/2015  . PAF (paroxysmal atrial fibrillation) ([place]) 02/04/2015  . Hypothyroidism   . Anemia in chronic kidney disease 12/09/2014  . Steal syndrome dialysis vascular access ([place]) 12/03/2014  . Secondary hyperparathyroidism of renal origin ([place]) 11/17/2014  . Physical deconditioning 08/04/2013  . Chronic kidney disease due to type 2 diabetes mellitus ([place]) 07/26/2013  . ESRD (end stage renal disease) on dialysis [facility]) 03/15/2012     Gabriela Eves, PT, DPT 02/11/2020, 11:52 AM  [facility] [place], [place], [address] Phone: [phone]   Fax:  [phone]  Name: [name] MRN: [id number] Date of Birth: 08-12-1940

## 2020-02-12 DIAGNOSIS — D62 Acute posthemorrhagic anemia: Secondary | ICD-10-CM | POA: Diagnosis not present

## 2020-02-12 DIAGNOSIS — E559 Vitamin D deficiency, unspecified: Secondary | ICD-10-CM | POA: Diagnosis not present

## 2020-02-12 DIAGNOSIS — N186 End stage renal disease: Secondary | ICD-10-CM | POA: Diagnosis not present

## 2020-02-12 DIAGNOSIS — I120 Hypertensive chronic kidney disease with stage 5 chronic kidney disease or end stage renal disease: Secondary | ICD-10-CM | POA: Diagnosis not present

## 2020-02-12 DIAGNOSIS — M25512 Pain in left shoulder: Secondary | ICD-10-CM | POA: Diagnosis not present

## 2020-02-12 DIAGNOSIS — N189 Chronic kidney disease, unspecified: Secondary | ICD-10-CM | POA: Diagnosis not present

## 2020-02-12 DIAGNOSIS — Z992 Dependence on renal dialysis: Secondary | ICD-10-CM | POA: Diagnosis not present

## 2020-02-12 DIAGNOSIS — J9611 Chronic respiratory failure with hypoxia: Secondary | ICD-10-CM | POA: Diagnosis not present

## 2020-02-12 DIAGNOSIS — Z23 Encounter for immunization: Secondary | ICD-10-CM | POA: Diagnosis not present

## 2020-02-12 DIAGNOSIS — J449 Chronic obstructive pulmonary disease, unspecified: Secondary | ICD-10-CM | POA: Diagnosis not present

## 2020-02-12 DIAGNOSIS — M9702XA Periprosthetic fracture around internal prosthetic left hip joint, initial encounter: Secondary | ICD-10-CM | POA: Diagnosis not present

## 2020-02-12 DIAGNOSIS — E785 Hyperlipidemia, unspecified: Secondary | ICD-10-CM | POA: Diagnosis not present

## 2020-02-12 DIAGNOSIS — I480 Paroxysmal atrial fibrillation: Secondary | ICD-10-CM | POA: Diagnosis not present

## 2020-02-12 DIAGNOSIS — I10 Essential (primary) hypertension: Secondary | ICD-10-CM | POA: Diagnosis not present

## 2020-02-12 DIAGNOSIS — T84031A Mechanical loosening of internal left hip prosthetic joint, initial encounter: Secondary | ICD-10-CM | POA: Diagnosis not present

## 2020-02-12 DIAGNOSIS — G4730 Sleep apnea, unspecified: Secondary | ICD-10-CM | POA: Diagnosis not present

## 2020-02-12 DIAGNOSIS — S62307A Unspecified fracture of fifth metacarpal bone, left hand, initial encounter for closed fracture: Secondary | ICD-10-CM | POA: Diagnosis not present

## 2020-02-12 DIAGNOSIS — G2581 Restless legs syndrome: Secondary | ICD-10-CM | POA: Diagnosis not present

## 2020-02-12 DIAGNOSIS — S51812A Laceration without foreign body of left forearm, initial encounter: Secondary | ICD-10-CM | POA: Diagnosis not present

## 2020-02-12 DIAGNOSIS — D631 Anemia in chronic kidney disease: Secondary | ICD-10-CM | POA: Diagnosis not present

## 2020-02-12 DIAGNOSIS — S7222XA Displaced subtrochanteric fracture of left femur, initial encounter for closed fracture: Secondary | ICD-10-CM | POA: Diagnosis not present

## 2020-02-12 DIAGNOSIS — I959 Hypotension, unspecified: Secondary | ICD-10-CM | POA: Diagnosis not present

## 2020-02-12 DIAGNOSIS — S728X2A Other fracture of left femur, initial encounter for closed fracture: Secondary | ICD-10-CM | POA: Diagnosis not present

## 2020-02-12 DIAGNOSIS — E1122 Type 2 diabetes mellitus with diabetic chronic kidney disease: Secondary | ICD-10-CM | POA: Diagnosis not present

## 2020-02-12 DIAGNOSIS — E039 Hypothyroidism, unspecified: Secondary | ICD-10-CM | POA: Diagnosis not present

## 2020-02-12 DIAGNOSIS — E875 Hyperkalemia: Secondary | ICD-10-CM | POA: Diagnosis not present

## 2020-02-12 DIAGNOSIS — K5900 Constipation, unspecified: Secondary | ICD-10-CM | POA: Diagnosis not present

## 2020-02-12 DIAGNOSIS — I9581 Postprocedural hypotension: Secondary | ICD-10-CM | POA: Diagnosis not present

## 2020-02-13 ENCOUNTER — Encounter: Payer: Medicare Other | Admitting: Physical Therapy

## 2020-02-13 DIAGNOSIS — M25512 Pain in left shoulder: Secondary | ICD-10-CM | POA: Diagnosis not present

## 2020-02-13 DIAGNOSIS — E1122 Type 2 diabetes mellitus with diabetic chronic kidney disease: Secondary | ICD-10-CM | POA: Diagnosis not present

## 2020-02-13 DIAGNOSIS — E785 Hyperlipidemia, unspecified: Secondary | ICD-10-CM | POA: Diagnosis not present

## 2020-02-13 DIAGNOSIS — I120 Hypertensive chronic kidney disease with stage 5 chronic kidney disease or end stage renal disease: Secondary | ICD-10-CM | POA: Diagnosis not present

## 2020-02-13 DIAGNOSIS — J9611 Chronic respiratory failure with hypoxia: Secondary | ICD-10-CM | POA: Diagnosis not present

## 2020-02-13 DIAGNOSIS — G4730 Sleep apnea, unspecified: Secondary | ICD-10-CM | POA: Diagnosis not present

## 2020-02-13 DIAGNOSIS — N189 Chronic kidney disease, unspecified: Secondary | ICD-10-CM | POA: Diagnosis not present

## 2020-02-13 DIAGNOSIS — T84031A Mechanical loosening of internal left hip prosthetic joint, initial encounter: Secondary | ICD-10-CM | POA: Diagnosis not present

## 2020-02-13 DIAGNOSIS — M9702XA Periprosthetic fracture around internal prosthetic left hip joint, initial encounter: Secondary | ICD-10-CM | POA: Diagnosis not present

## 2020-02-13 DIAGNOSIS — D631 Anemia in chronic kidney disease: Secondary | ICD-10-CM | POA: Diagnosis not present

## 2020-02-13 DIAGNOSIS — S62307A Unspecified fracture of fifth metacarpal bone, left hand, initial encounter for closed fracture: Secondary | ICD-10-CM | POA: Diagnosis not present

## 2020-02-13 DIAGNOSIS — Z23 Encounter for immunization: Secondary | ICD-10-CM | POA: Diagnosis not present

## 2020-02-13 DIAGNOSIS — Z992 Dependence on renal dialysis: Secondary | ICD-10-CM | POA: Diagnosis not present

## 2020-02-13 DIAGNOSIS — E875 Hyperkalemia: Secondary | ICD-10-CM | POA: Diagnosis not present

## 2020-02-13 DIAGNOSIS — S7222XA Displaced subtrochanteric fracture of left femur, initial encounter for closed fracture: Secondary | ICD-10-CM | POA: Diagnosis not present

## 2020-02-13 DIAGNOSIS — K5900 Constipation, unspecified: Secondary | ICD-10-CM | POA: Diagnosis not present

## 2020-02-13 DIAGNOSIS — G2581 Restless legs syndrome: Secondary | ICD-10-CM | POA: Diagnosis not present

## 2020-02-13 DIAGNOSIS — S728X2A Other fracture of left femur, initial encounter for closed fracture: Secondary | ICD-10-CM | POA: Diagnosis not present

## 2020-02-13 DIAGNOSIS — D62 Acute posthemorrhagic anemia: Secondary | ICD-10-CM | POA: Diagnosis not present

## 2020-02-13 DIAGNOSIS — J449 Chronic obstructive pulmonary disease, unspecified: Secondary | ICD-10-CM | POA: Diagnosis not present

## 2020-02-13 DIAGNOSIS — I9581 Postprocedural hypotension: Secondary | ICD-10-CM | POA: Diagnosis not present

## 2020-02-13 DIAGNOSIS — I959 Hypotension, unspecified: Secondary | ICD-10-CM | POA: Diagnosis not present

## 2020-02-13 DIAGNOSIS — S51812A Laceration without foreign body of left forearm, initial encounter: Secondary | ICD-10-CM | POA: Diagnosis not present

## 2020-02-13 DIAGNOSIS — I480 Paroxysmal atrial fibrillation: Secondary | ICD-10-CM | POA: Diagnosis not present

## 2020-02-13 DIAGNOSIS — E039 Hypothyroidism, unspecified: Secondary | ICD-10-CM | POA: Diagnosis not present

## 2020-02-13 DIAGNOSIS — E559 Vitamin D deficiency, unspecified: Secondary | ICD-10-CM | POA: Diagnosis not present

## 2020-02-13 DIAGNOSIS — N186 End stage renal disease: Secondary | ICD-10-CM | POA: Diagnosis not present

## 2020-02-14 DIAGNOSIS — I10 Essential (primary) hypertension: Secondary | ICD-10-CM | POA: Diagnosis not present

## 2020-02-14 DIAGNOSIS — E039 Hypothyroidism, unspecified: Secondary | ICD-10-CM | POA: Diagnosis not present

## 2020-02-14 DIAGNOSIS — M9702XA Periprosthetic fracture around internal prosthetic left hip joint, initial encounter: Secondary | ICD-10-CM | POA: Diagnosis not present

## 2020-02-14 DIAGNOSIS — I120 Hypertensive chronic kidney disease with stage 5 chronic kidney disease or end stage renal disease: Secondary | ICD-10-CM | POA: Diagnosis not present

## 2020-02-14 DIAGNOSIS — J449 Chronic obstructive pulmonary disease, unspecified: Secondary | ICD-10-CM | POA: Diagnosis not present

## 2020-02-14 DIAGNOSIS — M25512 Pain in left shoulder: Secondary | ICD-10-CM | POA: Diagnosis not present

## 2020-02-14 DIAGNOSIS — E559 Vitamin D deficiency, unspecified: Secondary | ICD-10-CM | POA: Diagnosis not present

## 2020-02-14 DIAGNOSIS — J9611 Chronic respiratory failure with hypoxia: Secondary | ICD-10-CM | POA: Diagnosis not present

## 2020-02-14 DIAGNOSIS — N186 End stage renal disease: Secondary | ICD-10-CM | POA: Diagnosis not present

## 2020-02-14 DIAGNOSIS — T84031A Mechanical loosening of internal left hip prosthetic joint, initial encounter: Secondary | ICD-10-CM | POA: Diagnosis not present

## 2020-02-14 DIAGNOSIS — G2581 Restless legs syndrome: Secondary | ICD-10-CM | POA: Diagnosis not present

## 2020-02-14 DIAGNOSIS — E875 Hyperkalemia: Secondary | ICD-10-CM | POA: Diagnosis not present

## 2020-02-14 DIAGNOSIS — S728X2A Other fracture of left femur, initial encounter for closed fracture: Secondary | ICD-10-CM | POA: Diagnosis not present

## 2020-02-14 DIAGNOSIS — I480 Paroxysmal atrial fibrillation: Secondary | ICD-10-CM | POA: Diagnosis not present

## 2020-02-14 DIAGNOSIS — E785 Hyperlipidemia, unspecified: Secondary | ICD-10-CM | POA: Diagnosis not present

## 2020-02-14 DIAGNOSIS — I959 Hypotension, unspecified: Secondary | ICD-10-CM | POA: Diagnosis not present

## 2020-02-14 DIAGNOSIS — D631 Anemia in chronic kidney disease: Secondary | ICD-10-CM | POA: Diagnosis not present

## 2020-02-14 DIAGNOSIS — Z23 Encounter for immunization: Secondary | ICD-10-CM | POA: Diagnosis not present

## 2020-02-14 DIAGNOSIS — D62 Acute posthemorrhagic anemia: Secondary | ICD-10-CM | POA: Diagnosis not present

## 2020-02-14 DIAGNOSIS — S7222XA Displaced subtrochanteric fracture of left femur, initial encounter for closed fracture: Secondary | ICD-10-CM | POA: Diagnosis not present

## 2020-02-14 DIAGNOSIS — E1122 Type 2 diabetes mellitus with diabetic chronic kidney disease: Secondary | ICD-10-CM | POA: Diagnosis not present

## 2020-02-14 DIAGNOSIS — I9581 Postprocedural hypotension: Secondary | ICD-10-CM | POA: Diagnosis not present

## 2020-02-14 DIAGNOSIS — S51812A Laceration without foreign body of left forearm, initial encounter: Secondary | ICD-10-CM | POA: Diagnosis not present

## 2020-02-14 DIAGNOSIS — N189 Chronic kidney disease, unspecified: Secondary | ICD-10-CM | POA: Diagnosis not present

## 2020-02-14 DIAGNOSIS — Z992 Dependence on renal dialysis: Secondary | ICD-10-CM | POA: Diagnosis not present

## 2020-02-14 DIAGNOSIS — G4730 Sleep apnea, unspecified: Secondary | ICD-10-CM | POA: Diagnosis not present

## 2020-02-14 DIAGNOSIS — S62307A Unspecified fracture of fifth metacarpal bone, left hand, initial encounter for closed fracture: Secondary | ICD-10-CM | POA: Diagnosis not present

## 2020-02-14 DIAGNOSIS — K5900 Constipation, unspecified: Secondary | ICD-10-CM | POA: Diagnosis not present

## 2020-02-15 DIAGNOSIS — T84031A Mechanical loosening of internal left hip prosthetic joint, initial encounter: Secondary | ICD-10-CM | POA: Diagnosis not present

## 2020-02-15 DIAGNOSIS — D62 Acute posthemorrhagic anemia: Secondary | ICD-10-CM | POA: Diagnosis not present

## 2020-02-15 DIAGNOSIS — Z992 Dependence on renal dialysis: Secondary | ICD-10-CM | POA: Diagnosis not present

## 2020-02-15 DIAGNOSIS — D631 Anemia in chronic kidney disease: Secondary | ICD-10-CM | POA: Diagnosis not present

## 2020-02-15 DIAGNOSIS — S51812A Laceration without foreign body of left forearm, initial encounter: Secondary | ICD-10-CM | POA: Diagnosis not present

## 2020-02-15 DIAGNOSIS — Z23 Encounter for immunization: Secondary | ICD-10-CM | POA: Diagnosis not present

## 2020-02-15 DIAGNOSIS — J449 Chronic obstructive pulmonary disease, unspecified: Secondary | ICD-10-CM | POA: Diagnosis not present

## 2020-02-15 DIAGNOSIS — J9611 Chronic respiratory failure with hypoxia: Secondary | ICD-10-CM | POA: Diagnosis not present

## 2020-02-15 DIAGNOSIS — I9581 Postprocedural hypotension: Secondary | ICD-10-CM | POA: Diagnosis not present

## 2020-02-15 DIAGNOSIS — I10 Essential (primary) hypertension: Secondary | ICD-10-CM | POA: Diagnosis not present

## 2020-02-15 DIAGNOSIS — N186 End stage renal disease: Secondary | ICD-10-CM | POA: Diagnosis not present

## 2020-02-15 DIAGNOSIS — K5900 Constipation, unspecified: Secondary | ICD-10-CM | POA: Diagnosis not present

## 2020-02-15 DIAGNOSIS — E039 Hypothyroidism, unspecified: Secondary | ICD-10-CM | POA: Diagnosis not present

## 2020-02-15 DIAGNOSIS — S728X2A Other fracture of left femur, initial encounter for closed fracture: Secondary | ICD-10-CM | POA: Diagnosis not present

## 2020-02-15 DIAGNOSIS — M9702XA Periprosthetic fracture around internal prosthetic left hip joint, initial encounter: Secondary | ICD-10-CM | POA: Diagnosis not present

## 2020-02-15 DIAGNOSIS — E1122 Type 2 diabetes mellitus with diabetic chronic kidney disease: Secondary | ICD-10-CM | POA: Diagnosis not present

## 2020-02-15 DIAGNOSIS — E875 Hyperkalemia: Secondary | ICD-10-CM | POA: Diagnosis not present

## 2020-02-15 DIAGNOSIS — S62307A Unspecified fracture of fifth metacarpal bone, left hand, initial encounter for closed fracture: Secondary | ICD-10-CM | POA: Diagnosis not present

## 2020-02-15 DIAGNOSIS — G2581 Restless legs syndrome: Secondary | ICD-10-CM | POA: Diagnosis not present

## 2020-02-15 DIAGNOSIS — E785 Hyperlipidemia, unspecified: Secondary | ICD-10-CM | POA: Diagnosis not present

## 2020-02-15 DIAGNOSIS — M25512 Pain in left shoulder: Secondary | ICD-10-CM | POA: Diagnosis not present

## 2020-02-15 DIAGNOSIS — G4730 Sleep apnea, unspecified: Secondary | ICD-10-CM | POA: Diagnosis not present

## 2020-02-15 DIAGNOSIS — I959 Hypotension, unspecified: Secondary | ICD-10-CM | POA: Diagnosis not present

## 2020-02-15 DIAGNOSIS — S7222XA Displaced subtrochanteric fracture of left femur, initial encounter for closed fracture: Secondary | ICD-10-CM | POA: Diagnosis not present

## 2020-02-15 DIAGNOSIS — E559 Vitamin D deficiency, unspecified: Secondary | ICD-10-CM | POA: Diagnosis not present

## 2020-02-15 DIAGNOSIS — I120 Hypertensive chronic kidney disease with stage 5 chronic kidney disease or end stage renal disease: Secondary | ICD-10-CM | POA: Diagnosis not present

## 2020-02-15 DIAGNOSIS — I480 Paroxysmal atrial fibrillation: Secondary | ICD-10-CM | POA: Diagnosis not present

## 2020-02-16 DIAGNOSIS — D62 Acute posthemorrhagic anemia: Secondary | ICD-10-CM | POA: Diagnosis not present

## 2020-02-16 DIAGNOSIS — I120 Hypertensive chronic kidney disease with stage 5 chronic kidney disease or end stage renal disease: Secondary | ICD-10-CM | POA: Diagnosis not present

## 2020-02-16 DIAGNOSIS — E1122 Type 2 diabetes mellitus with diabetic chronic kidney disease: Secondary | ICD-10-CM | POA: Diagnosis not present

## 2020-02-16 DIAGNOSIS — I9581 Postprocedural hypotension: Secondary | ICD-10-CM | POA: Diagnosis not present

## 2020-02-16 DIAGNOSIS — G4730 Sleep apnea, unspecified: Secondary | ICD-10-CM | POA: Diagnosis not present

## 2020-02-16 DIAGNOSIS — J9611 Chronic respiratory failure with hypoxia: Secondary | ICD-10-CM | POA: Diagnosis not present

## 2020-02-16 DIAGNOSIS — D631 Anemia in chronic kidney disease: Secondary | ICD-10-CM | POA: Diagnosis not present

## 2020-02-16 DIAGNOSIS — M9702XA Periprosthetic fracture around internal prosthetic left hip joint, initial encounter: Secondary | ICD-10-CM | POA: Diagnosis not present

## 2020-02-16 DIAGNOSIS — I959 Hypotension, unspecified: Secondary | ICD-10-CM | POA: Diagnosis not present

## 2020-02-16 DIAGNOSIS — S728X2A Other fracture of left femur, initial encounter for closed fracture: Secondary | ICD-10-CM | POA: Diagnosis not present

## 2020-02-16 DIAGNOSIS — J449 Chronic obstructive pulmonary disease, unspecified: Secondary | ICD-10-CM | POA: Diagnosis not present

## 2020-02-16 DIAGNOSIS — E875 Hyperkalemia: Secondary | ICD-10-CM | POA: Diagnosis not present

## 2020-02-16 DIAGNOSIS — S51812A Laceration without foreign body of left forearm, initial encounter: Secondary | ICD-10-CM | POA: Diagnosis not present

## 2020-02-16 DIAGNOSIS — M25512 Pain in left shoulder: Secondary | ICD-10-CM | POA: Diagnosis not present

## 2020-02-16 DIAGNOSIS — Z992 Dependence on renal dialysis: Secondary | ICD-10-CM | POA: Diagnosis not present

## 2020-02-16 DIAGNOSIS — Z23 Encounter for immunization: Secondary | ICD-10-CM | POA: Diagnosis not present

## 2020-02-16 DIAGNOSIS — T84031A Mechanical loosening of internal left hip prosthetic joint, initial encounter: Secondary | ICD-10-CM | POA: Diagnosis not present

## 2020-02-16 DIAGNOSIS — E785 Hyperlipidemia, unspecified: Secondary | ICD-10-CM | POA: Diagnosis not present

## 2020-02-16 DIAGNOSIS — K5900 Constipation, unspecified: Secondary | ICD-10-CM | POA: Diagnosis not present

## 2020-02-16 DIAGNOSIS — S62307A Unspecified fracture of fifth metacarpal bone, left hand, initial encounter for closed fracture: Secondary | ICD-10-CM | POA: Diagnosis not present

## 2020-02-16 DIAGNOSIS — E559 Vitamin D deficiency, unspecified: Secondary | ICD-10-CM | POA: Diagnosis not present

## 2020-02-16 DIAGNOSIS — E039 Hypothyroidism, unspecified: Secondary | ICD-10-CM | POA: Diagnosis not present

## 2020-02-16 DIAGNOSIS — I10 Essential (primary) hypertension: Secondary | ICD-10-CM | POA: Diagnosis not present

## 2020-02-16 DIAGNOSIS — I480 Paroxysmal atrial fibrillation: Secondary | ICD-10-CM | POA: Diagnosis not present

## 2020-02-16 DIAGNOSIS — G2581 Restless legs syndrome: Secondary | ICD-10-CM | POA: Diagnosis not present

## 2020-02-16 DIAGNOSIS — N186 End stage renal disease: Secondary | ICD-10-CM | POA: Diagnosis not present

## 2020-02-17 DIAGNOSIS — T84031A Mechanical loosening of internal left hip prosthetic joint, initial encounter: Secondary | ICD-10-CM | POA: Diagnosis not present

## 2020-02-17 DIAGNOSIS — M9702XA Periprosthetic fracture around internal prosthetic left hip joint, initial encounter: Secondary | ICD-10-CM | POA: Diagnosis not present

## 2020-02-17 DIAGNOSIS — E039 Hypothyroidism, unspecified: Secondary | ICD-10-CM | POA: Diagnosis not present

## 2020-02-17 DIAGNOSIS — E875 Hyperkalemia: Secondary | ICD-10-CM | POA: Diagnosis not present

## 2020-02-17 DIAGNOSIS — Z23 Encounter for immunization: Secondary | ICD-10-CM | POA: Diagnosis not present

## 2020-02-17 DIAGNOSIS — K5900 Constipation, unspecified: Secondary | ICD-10-CM | POA: Diagnosis not present

## 2020-02-17 DIAGNOSIS — G4730 Sleep apnea, unspecified: Secondary | ICD-10-CM | POA: Diagnosis not present

## 2020-02-17 DIAGNOSIS — I480 Paroxysmal atrial fibrillation: Secondary | ICD-10-CM | POA: Diagnosis not present

## 2020-02-17 DIAGNOSIS — I120 Hypertensive chronic kidney disease with stage 5 chronic kidney disease or end stage renal disease: Secondary | ICD-10-CM | POA: Diagnosis not present

## 2020-02-17 DIAGNOSIS — M25512 Pain in left shoulder: Secondary | ICD-10-CM | POA: Diagnosis not present

## 2020-02-17 DIAGNOSIS — S51812A Laceration without foreign body of left forearm, initial encounter: Secondary | ICD-10-CM | POA: Diagnosis not present

## 2020-02-17 DIAGNOSIS — N186 End stage renal disease: Secondary | ICD-10-CM | POA: Diagnosis not present

## 2020-02-17 DIAGNOSIS — Z992 Dependence on renal dialysis: Secondary | ICD-10-CM | POA: Diagnosis not present

## 2020-02-17 DIAGNOSIS — S72002A Fracture of unspecified part of neck of left femur, initial encounter for closed fracture: Secondary | ICD-10-CM | POA: Diagnosis not present

## 2020-02-17 DIAGNOSIS — E559 Vitamin D deficiency, unspecified: Secondary | ICD-10-CM | POA: Diagnosis not present

## 2020-02-17 DIAGNOSIS — S728X2A Other fracture of left femur, initial encounter for closed fracture: Secondary | ICD-10-CM | POA: Diagnosis not present

## 2020-02-17 DIAGNOSIS — E1122 Type 2 diabetes mellitus with diabetic chronic kidney disease: Secondary | ICD-10-CM | POA: Diagnosis not present

## 2020-02-17 DIAGNOSIS — G2581 Restless legs syndrome: Secondary | ICD-10-CM | POA: Diagnosis not present

## 2020-02-17 DIAGNOSIS — J449 Chronic obstructive pulmonary disease, unspecified: Secondary | ICD-10-CM | POA: Diagnosis not present

## 2020-02-17 DIAGNOSIS — I10 Essential (primary) hypertension: Secondary | ICD-10-CM | POA: Diagnosis not present

## 2020-02-17 DIAGNOSIS — I959 Hypotension, unspecified: Secondary | ICD-10-CM | POA: Diagnosis not present

## 2020-02-17 DIAGNOSIS — D631 Anemia in chronic kidney disease: Secondary | ICD-10-CM | POA: Diagnosis not present

## 2020-02-17 DIAGNOSIS — J9611 Chronic respiratory failure with hypoxia: Secondary | ICD-10-CM | POA: Diagnosis not present

## 2020-02-17 DIAGNOSIS — D62 Acute posthemorrhagic anemia: Secondary | ICD-10-CM | POA: Diagnosis not present

## 2020-02-17 DIAGNOSIS — I9581 Postprocedural hypotension: Secondary | ICD-10-CM | POA: Diagnosis not present

## 2020-02-17 DIAGNOSIS — E785 Hyperlipidemia, unspecified: Secondary | ICD-10-CM | POA: Diagnosis not present

## 2020-02-17 DIAGNOSIS — S62307A Unspecified fracture of fifth metacarpal bone, left hand, initial encounter for closed fracture: Secondary | ICD-10-CM | POA: Diagnosis not present

## 2020-02-18 DIAGNOSIS — J9611 Chronic respiratory failure with hypoxia: Secondary | ICD-10-CM | POA: Diagnosis not present

## 2020-02-18 DIAGNOSIS — E039 Hypothyroidism, unspecified: Secondary | ICD-10-CM | POA: Diagnosis not present

## 2020-02-18 DIAGNOSIS — S62307A Unspecified fracture of fifth metacarpal bone, left hand, initial encounter for closed fracture: Secondary | ICD-10-CM | POA: Diagnosis not present

## 2020-02-18 DIAGNOSIS — N186 End stage renal disease: Secondary | ICD-10-CM | POA: Diagnosis not present

## 2020-02-18 DIAGNOSIS — S51812A Laceration without foreign body of left forearm, initial encounter: Secondary | ICD-10-CM | POA: Diagnosis not present

## 2020-02-18 DIAGNOSIS — E1122 Type 2 diabetes mellitus with diabetic chronic kidney disease: Secondary | ICD-10-CM | POA: Diagnosis not present

## 2020-02-18 DIAGNOSIS — Z992 Dependence on renal dialysis: Secondary | ICD-10-CM | POA: Diagnosis not present

## 2020-02-18 DIAGNOSIS — I120 Hypertensive chronic kidney disease with stage 5 chronic kidney disease or end stage renal disease: Secondary | ICD-10-CM | POA: Diagnosis not present

## 2020-02-18 DIAGNOSIS — M9702XA Periprosthetic fracture around internal prosthetic left hip joint, initial encounter: Secondary | ICD-10-CM | POA: Diagnosis not present

## 2020-02-18 DIAGNOSIS — S7222XA Displaced subtrochanteric fracture of left femur, initial encounter for closed fracture: Secondary | ICD-10-CM | POA: Diagnosis not present

## 2020-02-18 DIAGNOSIS — E875 Hyperkalemia: Secondary | ICD-10-CM | POA: Diagnosis not present

## 2020-02-18 DIAGNOSIS — G2581 Restless legs syndrome: Secondary | ICD-10-CM | POA: Diagnosis not present

## 2020-02-18 DIAGNOSIS — T84031A Mechanical loosening of internal left hip prosthetic joint, initial encounter: Secondary | ICD-10-CM | POA: Diagnosis not present

## 2020-02-18 DIAGNOSIS — D62 Acute posthemorrhagic anemia: Secondary | ICD-10-CM | POA: Diagnosis not present

## 2020-02-18 DIAGNOSIS — J449 Chronic obstructive pulmonary disease, unspecified: Secondary | ICD-10-CM | POA: Diagnosis not present

## 2020-02-18 DIAGNOSIS — I9581 Postprocedural hypotension: Secondary | ICD-10-CM | POA: Diagnosis not present

## 2020-02-18 DIAGNOSIS — M25512 Pain in left shoulder: Secondary | ICD-10-CM | POA: Diagnosis not present

## 2020-02-18 DIAGNOSIS — D631 Anemia in chronic kidney disease: Secondary | ICD-10-CM | POA: Diagnosis not present

## 2020-02-18 DIAGNOSIS — I480 Paroxysmal atrial fibrillation: Secondary | ICD-10-CM | POA: Diagnosis not present

## 2020-02-18 DIAGNOSIS — E559 Vitamin D deficiency, unspecified: Secondary | ICD-10-CM | POA: Diagnosis not present

## 2020-02-18 DIAGNOSIS — S728X2A Other fracture of left femur, initial encounter for closed fracture: Secondary | ICD-10-CM | POA: Diagnosis not present

## 2020-02-18 DIAGNOSIS — E785 Hyperlipidemia, unspecified: Secondary | ICD-10-CM | POA: Diagnosis not present

## 2020-02-18 DIAGNOSIS — K5900 Constipation, unspecified: Secondary | ICD-10-CM | POA: Diagnosis not present

## 2020-02-18 DIAGNOSIS — G4730 Sleep apnea, unspecified: Secondary | ICD-10-CM | POA: Diagnosis not present

## 2020-02-18 DIAGNOSIS — I959 Hypotension, unspecified: Secondary | ICD-10-CM | POA: Diagnosis not present

## 2020-02-18 DIAGNOSIS — Z23 Encounter for immunization: Secondary | ICD-10-CM | POA: Diagnosis not present

## 2020-02-19 DIAGNOSIS — G2581 Restless legs syndrome: Secondary | ICD-10-CM | POA: Diagnosis not present

## 2020-02-19 DIAGNOSIS — Z471 Aftercare following joint replacement surgery: Secondary | ICD-10-CM | POA: Diagnosis not present

## 2020-02-19 DIAGNOSIS — D62 Acute posthemorrhagic anemia: Secondary | ICD-10-CM | POA: Diagnosis not present

## 2020-02-19 DIAGNOSIS — E1122 Type 2 diabetes mellitus with diabetic chronic kidney disease: Secondary | ICD-10-CM | POA: Diagnosis not present

## 2020-02-19 DIAGNOSIS — T84031A Mechanical loosening of internal left hip prosthetic joint, initial encounter: Secondary | ICD-10-CM | POA: Diagnosis not present

## 2020-02-19 DIAGNOSIS — D631 Anemia in chronic kidney disease: Secondary | ICD-10-CM | POA: Diagnosis not present

## 2020-02-19 DIAGNOSIS — S728X2A Other fracture of left femur, initial encounter for closed fracture: Secondary | ICD-10-CM | POA: Diagnosis not present

## 2020-02-19 DIAGNOSIS — Z23 Encounter for immunization: Secondary | ICD-10-CM | POA: Diagnosis not present

## 2020-02-19 DIAGNOSIS — J9611 Chronic respiratory failure with hypoxia: Secondary | ICD-10-CM | POA: Diagnosis not present

## 2020-02-19 DIAGNOSIS — E785 Hyperlipidemia, unspecified: Secondary | ICD-10-CM | POA: Diagnosis not present

## 2020-02-19 DIAGNOSIS — M9702XA Periprosthetic fracture around internal prosthetic left hip joint, initial encounter: Secondary | ICD-10-CM | POA: Diagnosis not present

## 2020-02-19 DIAGNOSIS — I4891 Unspecified atrial fibrillation: Secondary | ICD-10-CM | POA: Diagnosis not present

## 2020-02-19 DIAGNOSIS — Z96642 Presence of left artificial hip joint: Secondary | ICD-10-CM | POA: Diagnosis not present

## 2020-02-19 DIAGNOSIS — I120 Hypertensive chronic kidney disease with stage 5 chronic kidney disease or end stage renal disease: Secondary | ICD-10-CM | POA: Diagnosis not present

## 2020-02-19 DIAGNOSIS — J449 Chronic obstructive pulmonary disease, unspecified: Secondary | ICD-10-CM | POA: Diagnosis not present

## 2020-02-19 DIAGNOSIS — M25512 Pain in left shoulder: Secondary | ICD-10-CM | POA: Diagnosis not present

## 2020-02-19 DIAGNOSIS — S7222XA Displaced subtrochanteric fracture of left femur, initial encounter for closed fracture: Secondary | ICD-10-CM | POA: Diagnosis not present

## 2020-02-19 DIAGNOSIS — K5900 Constipation, unspecified: Secondary | ICD-10-CM | POA: Diagnosis not present

## 2020-02-19 DIAGNOSIS — N186 End stage renal disease: Secondary | ICD-10-CM | POA: Diagnosis not present

## 2020-02-19 DIAGNOSIS — E559 Vitamin D deficiency, unspecified: Secondary | ICD-10-CM | POA: Diagnosis not present

## 2020-02-19 DIAGNOSIS — E875 Hyperkalemia: Secondary | ICD-10-CM | POA: Diagnosis not present

## 2020-02-19 DIAGNOSIS — N189 Chronic kidney disease, unspecified: Secondary | ICD-10-CM | POA: Diagnosis not present

## 2020-02-19 DIAGNOSIS — I9581 Postprocedural hypotension: Secondary | ICD-10-CM | POA: Diagnosis not present

## 2020-02-19 DIAGNOSIS — S51812A Laceration without foreign body of left forearm, initial encounter: Secondary | ICD-10-CM | POA: Diagnosis not present

## 2020-02-19 DIAGNOSIS — I480 Paroxysmal atrial fibrillation: Secondary | ICD-10-CM | POA: Diagnosis not present

## 2020-02-19 DIAGNOSIS — I959 Hypotension, unspecified: Secondary | ICD-10-CM | POA: Diagnosis not present

## 2020-02-19 DIAGNOSIS — E039 Hypothyroidism, unspecified: Secondary | ICD-10-CM | POA: Diagnosis not present

## 2020-02-19 DIAGNOSIS — G4730 Sleep apnea, unspecified: Secondary | ICD-10-CM | POA: Diagnosis not present

## 2020-02-19 DIAGNOSIS — S62307A Unspecified fracture of fifth metacarpal bone, left hand, initial encounter for closed fracture: Secondary | ICD-10-CM | POA: Diagnosis not present

## 2020-02-19 DIAGNOSIS — Z992 Dependence on renal dialysis: Secondary | ICD-10-CM | POA: Diagnosis not present

## 2020-02-20 DIAGNOSIS — S51812A Laceration without foreign body of left forearm, initial encounter: Secondary | ICD-10-CM | POA: Diagnosis not present

## 2020-02-20 DIAGNOSIS — T84031A Mechanical loosening of internal left hip prosthetic joint, initial encounter: Secondary | ICD-10-CM | POA: Diagnosis not present

## 2020-02-20 DIAGNOSIS — K5900 Constipation, unspecified: Secondary | ICD-10-CM | POA: Diagnosis not present

## 2020-02-20 DIAGNOSIS — D62 Acute posthemorrhagic anemia: Secondary | ICD-10-CM | POA: Diagnosis not present

## 2020-02-20 DIAGNOSIS — Z992 Dependence on renal dialysis: Secondary | ICD-10-CM | POA: Diagnosis not present

## 2020-02-20 DIAGNOSIS — M9702XA Periprosthetic fracture around internal prosthetic left hip joint, initial encounter: Secondary | ICD-10-CM | POA: Diagnosis not present

## 2020-02-20 DIAGNOSIS — M25512 Pain in left shoulder: Secondary | ICD-10-CM | POA: Diagnosis not present

## 2020-02-20 DIAGNOSIS — I9581 Postprocedural hypotension: Secondary | ICD-10-CM | POA: Diagnosis not present

## 2020-02-20 DIAGNOSIS — E785 Hyperlipidemia, unspecified: Secondary | ICD-10-CM | POA: Diagnosis not present

## 2020-02-20 DIAGNOSIS — S728X2A Other fracture of left femur, initial encounter for closed fracture: Secondary | ICD-10-CM | POA: Diagnosis not present

## 2020-02-20 DIAGNOSIS — G2581 Restless legs syndrome: Secondary | ICD-10-CM | POA: Diagnosis not present

## 2020-02-20 DIAGNOSIS — E559 Vitamin D deficiency, unspecified: Secondary | ICD-10-CM | POA: Diagnosis not present

## 2020-02-20 DIAGNOSIS — I959 Hypotension, unspecified: Secondary | ICD-10-CM | POA: Diagnosis not present

## 2020-02-20 DIAGNOSIS — S62307A Unspecified fracture of fifth metacarpal bone, left hand, initial encounter for closed fracture: Secondary | ICD-10-CM | POA: Diagnosis not present

## 2020-02-20 DIAGNOSIS — E039 Hypothyroidism, unspecified: Secondary | ICD-10-CM | POA: Diagnosis not present

## 2020-02-20 DIAGNOSIS — S7222XA Displaced subtrochanteric fracture of left femur, initial encounter for closed fracture: Secondary | ICD-10-CM | POA: Diagnosis not present

## 2020-02-20 DIAGNOSIS — G4730 Sleep apnea, unspecified: Secondary | ICD-10-CM | POA: Diagnosis not present

## 2020-02-20 DIAGNOSIS — I120 Hypertensive chronic kidney disease with stage 5 chronic kidney disease or end stage renal disease: Secondary | ICD-10-CM | POA: Diagnosis not present

## 2020-02-20 DIAGNOSIS — J449 Chronic obstructive pulmonary disease, unspecified: Secondary | ICD-10-CM | POA: Diagnosis not present

## 2020-02-20 DIAGNOSIS — J9611 Chronic respiratory failure with hypoxia: Secondary | ICD-10-CM | POA: Diagnosis not present

## 2020-02-20 DIAGNOSIS — D631 Anemia in chronic kidney disease: Secondary | ICD-10-CM | POA: Diagnosis not present

## 2020-02-20 DIAGNOSIS — N189 Chronic kidney disease, unspecified: Secondary | ICD-10-CM | POA: Diagnosis not present

## 2020-02-20 DIAGNOSIS — I480 Paroxysmal atrial fibrillation: Secondary | ICD-10-CM | POA: Diagnosis not present

## 2020-02-20 DIAGNOSIS — N186 End stage renal disease: Secondary | ICD-10-CM | POA: Diagnosis not present

## 2020-02-20 DIAGNOSIS — E1122 Type 2 diabetes mellitus with diabetic chronic kidney disease: Secondary | ICD-10-CM | POA: Diagnosis not present

## 2020-02-20 DIAGNOSIS — Z23 Encounter for immunization: Secondary | ICD-10-CM | POA: Diagnosis not present

## 2020-02-20 DIAGNOSIS — E875 Hyperkalemia: Secondary | ICD-10-CM | POA: Diagnosis not present

## 2020-02-21 DIAGNOSIS — I9581 Postprocedural hypotension: Secondary | ICD-10-CM | POA: Diagnosis not present

## 2020-02-21 DIAGNOSIS — E1122 Type 2 diabetes mellitus with diabetic chronic kidney disease: Secondary | ICD-10-CM | POA: Diagnosis not present

## 2020-02-21 DIAGNOSIS — S51812A Laceration without foreign body of left forearm, initial encounter: Secondary | ICD-10-CM | POA: Diagnosis not present

## 2020-02-21 DIAGNOSIS — I120 Hypertensive chronic kidney disease with stage 5 chronic kidney disease or end stage renal disease: Secondary | ICD-10-CM | POA: Diagnosis not present

## 2020-02-21 DIAGNOSIS — K5900 Constipation, unspecified: Secondary | ICD-10-CM | POA: Diagnosis not present

## 2020-02-21 DIAGNOSIS — N186 End stage renal disease: Secondary | ICD-10-CM | POA: Diagnosis not present

## 2020-02-21 DIAGNOSIS — S728X2A Other fracture of left femur, initial encounter for closed fracture: Secondary | ICD-10-CM | POA: Diagnosis not present

## 2020-02-21 DIAGNOSIS — J9611 Chronic respiratory failure with hypoxia: Secondary | ICD-10-CM | POA: Diagnosis not present

## 2020-02-21 DIAGNOSIS — E785 Hyperlipidemia, unspecified: Secondary | ICD-10-CM | POA: Diagnosis not present

## 2020-02-21 DIAGNOSIS — D631 Anemia in chronic kidney disease: Secondary | ICD-10-CM | POA: Diagnosis not present

## 2020-02-21 DIAGNOSIS — Z23 Encounter for immunization: Secondary | ICD-10-CM | POA: Diagnosis not present

## 2020-02-21 DIAGNOSIS — N189 Chronic kidney disease, unspecified: Secondary | ICD-10-CM | POA: Diagnosis not present

## 2020-02-21 DIAGNOSIS — S7222XA Displaced subtrochanteric fracture of left femur, initial encounter for closed fracture: Secondary | ICD-10-CM | POA: Diagnosis not present

## 2020-02-21 DIAGNOSIS — E875 Hyperkalemia: Secondary | ICD-10-CM | POA: Diagnosis not present

## 2020-02-21 DIAGNOSIS — G2581 Restless legs syndrome: Secondary | ICD-10-CM | POA: Diagnosis not present

## 2020-02-21 DIAGNOSIS — E559 Vitamin D deficiency, unspecified: Secondary | ICD-10-CM | POA: Diagnosis not present

## 2020-02-21 DIAGNOSIS — M9702XA Periprosthetic fracture around internal prosthetic left hip joint, initial encounter: Secondary | ICD-10-CM | POA: Diagnosis not present

## 2020-02-21 DIAGNOSIS — S62307A Unspecified fracture of fifth metacarpal bone, left hand, initial encounter for closed fracture: Secondary | ICD-10-CM | POA: Diagnosis not present

## 2020-02-21 DIAGNOSIS — E039 Hypothyroidism, unspecified: Secondary | ICD-10-CM | POA: Diagnosis not present

## 2020-02-21 DIAGNOSIS — G4730 Sleep apnea, unspecified: Secondary | ICD-10-CM | POA: Diagnosis not present

## 2020-02-21 DIAGNOSIS — I480 Paroxysmal atrial fibrillation: Secondary | ICD-10-CM | POA: Diagnosis not present

## 2020-02-21 DIAGNOSIS — I959 Hypotension, unspecified: Secondary | ICD-10-CM | POA: Diagnosis not present

## 2020-02-21 DIAGNOSIS — J449 Chronic obstructive pulmonary disease, unspecified: Secondary | ICD-10-CM | POA: Diagnosis not present

## 2020-02-21 DIAGNOSIS — T84031A Mechanical loosening of internal left hip prosthetic joint, initial encounter: Secondary | ICD-10-CM | POA: Diagnosis not present

## 2020-02-21 DIAGNOSIS — Z992 Dependence on renal dialysis: Secondary | ICD-10-CM | POA: Diagnosis not present

## 2020-02-21 DIAGNOSIS — D62 Acute posthemorrhagic anemia: Secondary | ICD-10-CM | POA: Diagnosis not present

## 2020-02-21 DIAGNOSIS — M25512 Pain in left shoulder: Secondary | ICD-10-CM | POA: Diagnosis not present

## 2020-02-22 DIAGNOSIS — E559 Vitamin D deficiency, unspecified: Secondary | ICD-10-CM | POA: Diagnosis not present

## 2020-02-22 DIAGNOSIS — M25512 Pain in left shoulder: Secondary | ICD-10-CM | POA: Diagnosis not present

## 2020-02-22 DIAGNOSIS — J449 Chronic obstructive pulmonary disease, unspecified: Secondary | ICD-10-CM | POA: Diagnosis not present

## 2020-02-22 DIAGNOSIS — S51812A Laceration without foreign body of left forearm, initial encounter: Secondary | ICD-10-CM | POA: Diagnosis not present

## 2020-02-22 DIAGNOSIS — D631 Anemia in chronic kidney disease: Secondary | ICD-10-CM | POA: Diagnosis not present

## 2020-02-22 DIAGNOSIS — M9702XA Periprosthetic fracture around internal prosthetic left hip joint, initial encounter: Secondary | ICD-10-CM | POA: Diagnosis not present

## 2020-02-22 DIAGNOSIS — S7222XA Displaced subtrochanteric fracture of left femur, initial encounter for closed fracture: Secondary | ICD-10-CM | POA: Diagnosis not present

## 2020-02-22 DIAGNOSIS — E039 Hypothyroidism, unspecified: Secondary | ICD-10-CM | POA: Diagnosis not present

## 2020-02-22 DIAGNOSIS — E1122 Type 2 diabetes mellitus with diabetic chronic kidney disease: Secondary | ICD-10-CM | POA: Diagnosis not present

## 2020-02-22 DIAGNOSIS — S728X2A Other fracture of left femur, initial encounter for closed fracture: Secondary | ICD-10-CM | POA: Diagnosis not present

## 2020-02-22 DIAGNOSIS — I120 Hypertensive chronic kidney disease with stage 5 chronic kidney disease or end stage renal disease: Secondary | ICD-10-CM | POA: Diagnosis not present

## 2020-02-22 DIAGNOSIS — Z992 Dependence on renal dialysis: Secondary | ICD-10-CM | POA: Diagnosis not present

## 2020-02-22 DIAGNOSIS — J9611 Chronic respiratory failure with hypoxia: Secondary | ICD-10-CM | POA: Diagnosis not present

## 2020-02-22 DIAGNOSIS — S62307A Unspecified fracture of fifth metacarpal bone, left hand, initial encounter for closed fracture: Secondary | ICD-10-CM | POA: Diagnosis not present

## 2020-02-22 DIAGNOSIS — K5900 Constipation, unspecified: Secondary | ICD-10-CM | POA: Diagnosis not present

## 2020-02-22 DIAGNOSIS — E875 Hyperkalemia: Secondary | ICD-10-CM | POA: Diagnosis not present

## 2020-02-22 DIAGNOSIS — T84031A Mechanical loosening of internal left hip prosthetic joint, initial encounter: Secondary | ICD-10-CM | POA: Diagnosis not present

## 2020-02-22 DIAGNOSIS — G2581 Restless legs syndrome: Secondary | ICD-10-CM | POA: Diagnosis not present

## 2020-02-22 DIAGNOSIS — E785 Hyperlipidemia, unspecified: Secondary | ICD-10-CM | POA: Diagnosis not present

## 2020-02-22 DIAGNOSIS — I959 Hypotension, unspecified: Secondary | ICD-10-CM | POA: Diagnosis not present

## 2020-02-22 DIAGNOSIS — I480 Paroxysmal atrial fibrillation: Secondary | ICD-10-CM | POA: Diagnosis not present

## 2020-02-22 DIAGNOSIS — Z23 Encounter for immunization: Secondary | ICD-10-CM | POA: Diagnosis not present

## 2020-02-22 DIAGNOSIS — G4730 Sleep apnea, unspecified: Secondary | ICD-10-CM | POA: Diagnosis not present

## 2020-02-22 DIAGNOSIS — D62 Acute posthemorrhagic anemia: Secondary | ICD-10-CM | POA: Diagnosis not present

## 2020-02-22 DIAGNOSIS — N186 End stage renal disease: Secondary | ICD-10-CM | POA: Diagnosis not present

## 2020-02-22 DIAGNOSIS — I9581 Postprocedural hypotension: Secondary | ICD-10-CM | POA: Diagnosis not present

## 2020-02-23 DIAGNOSIS — S51812A Laceration without foreign body of left forearm, initial encounter: Secondary | ICD-10-CM | POA: Diagnosis not present

## 2020-02-23 DIAGNOSIS — I959 Hypotension, unspecified: Secondary | ICD-10-CM | POA: Diagnosis not present

## 2020-02-23 DIAGNOSIS — N186 End stage renal disease: Secondary | ICD-10-CM | POA: Diagnosis not present

## 2020-02-23 DIAGNOSIS — S728X2A Other fracture of left femur, initial encounter for closed fracture: Secondary | ICD-10-CM | POA: Diagnosis not present

## 2020-02-23 DIAGNOSIS — S7222XA Displaced subtrochanteric fracture of left femur, initial encounter for closed fracture: Secondary | ICD-10-CM | POA: Diagnosis not present

## 2020-02-23 DIAGNOSIS — S62307A Unspecified fracture of fifth metacarpal bone, left hand, initial encounter for closed fracture: Secondary | ICD-10-CM | POA: Diagnosis not present

## 2020-02-23 DIAGNOSIS — Z992 Dependence on renal dialysis: Secondary | ICD-10-CM | POA: Diagnosis not present

## 2020-02-23 DIAGNOSIS — I480 Paroxysmal atrial fibrillation: Secondary | ICD-10-CM | POA: Diagnosis not present

## 2020-02-23 DIAGNOSIS — K5900 Constipation, unspecified: Secondary | ICD-10-CM | POA: Diagnosis not present

## 2020-02-23 DIAGNOSIS — G4730 Sleep apnea, unspecified: Secondary | ICD-10-CM | POA: Diagnosis not present

## 2020-02-23 DIAGNOSIS — E875 Hyperkalemia: Secondary | ICD-10-CM | POA: Diagnosis not present

## 2020-02-23 DIAGNOSIS — J9611 Chronic respiratory failure with hypoxia: Secondary | ICD-10-CM | POA: Diagnosis not present

## 2020-02-23 DIAGNOSIS — E559 Vitamin D deficiency, unspecified: Secondary | ICD-10-CM | POA: Diagnosis not present

## 2020-02-23 DIAGNOSIS — I9581 Postprocedural hypotension: Secondary | ICD-10-CM | POA: Diagnosis not present

## 2020-02-23 DIAGNOSIS — D631 Anemia in chronic kidney disease: Secondary | ICD-10-CM | POA: Diagnosis not present

## 2020-02-23 DIAGNOSIS — E039 Hypothyroidism, unspecified: Secondary | ICD-10-CM | POA: Diagnosis not present

## 2020-02-23 DIAGNOSIS — E785 Hyperlipidemia, unspecified: Secondary | ICD-10-CM | POA: Diagnosis not present

## 2020-02-23 DIAGNOSIS — I120 Hypertensive chronic kidney disease with stage 5 chronic kidney disease or end stage renal disease: Secondary | ICD-10-CM | POA: Diagnosis not present

## 2020-02-23 DIAGNOSIS — T84031A Mechanical loosening of internal left hip prosthetic joint, initial encounter: Secondary | ICD-10-CM | POA: Diagnosis not present

## 2020-02-23 DIAGNOSIS — M25512 Pain in left shoulder: Secondary | ICD-10-CM | POA: Diagnosis not present

## 2020-02-23 DIAGNOSIS — E1122 Type 2 diabetes mellitus with diabetic chronic kidney disease: Secondary | ICD-10-CM | POA: Diagnosis not present

## 2020-02-23 DIAGNOSIS — J449 Chronic obstructive pulmonary disease, unspecified: Secondary | ICD-10-CM | POA: Diagnosis not present

## 2020-02-23 DIAGNOSIS — D62 Acute posthemorrhagic anemia: Secondary | ICD-10-CM | POA: Diagnosis not present

## 2020-02-23 DIAGNOSIS — Z23 Encounter for immunization: Secondary | ICD-10-CM | POA: Diagnosis not present

## 2020-02-23 DIAGNOSIS — M9702XA Periprosthetic fracture around internal prosthetic left hip joint, initial encounter: Secondary | ICD-10-CM | POA: Diagnosis not present

## 2020-02-23 DIAGNOSIS — G2581 Restless legs syndrome: Secondary | ICD-10-CM | POA: Diagnosis not present

## 2020-02-24 DIAGNOSIS — I480 Paroxysmal atrial fibrillation: Secondary | ICD-10-CM | POA: Diagnosis not present

## 2020-02-24 DIAGNOSIS — E559 Vitamin D deficiency, unspecified: Secondary | ICD-10-CM | POA: Diagnosis not present

## 2020-02-24 DIAGNOSIS — D62 Acute posthemorrhagic anemia: Secondary | ICD-10-CM | POA: Diagnosis not present

## 2020-02-24 DIAGNOSIS — M25512 Pain in left shoulder: Secondary | ICD-10-CM | POA: Diagnosis not present

## 2020-02-24 DIAGNOSIS — G2581 Restless legs syndrome: Secondary | ICD-10-CM | POA: Diagnosis not present

## 2020-02-24 DIAGNOSIS — I959 Hypotension, unspecified: Secondary | ICD-10-CM | POA: Diagnosis not present

## 2020-02-24 DIAGNOSIS — K5900 Constipation, unspecified: Secondary | ICD-10-CM | POA: Diagnosis not present

## 2020-02-24 DIAGNOSIS — Z23 Encounter for immunization: Secondary | ICD-10-CM | POA: Diagnosis not present

## 2020-02-24 DIAGNOSIS — E785 Hyperlipidemia, unspecified: Secondary | ICD-10-CM | POA: Diagnosis not present

## 2020-02-24 DIAGNOSIS — J9611 Chronic respiratory failure with hypoxia: Secondary | ICD-10-CM | POA: Diagnosis not present

## 2020-02-24 DIAGNOSIS — E039 Hypothyroidism, unspecified: Secondary | ICD-10-CM | POA: Diagnosis not present

## 2020-02-24 DIAGNOSIS — I120 Hypertensive chronic kidney disease with stage 5 chronic kidney disease or end stage renal disease: Secondary | ICD-10-CM | POA: Diagnosis not present

## 2020-02-24 DIAGNOSIS — N186 End stage renal disease: Secondary | ICD-10-CM | POA: Diagnosis not present

## 2020-02-24 DIAGNOSIS — S62307A Unspecified fracture of fifth metacarpal bone, left hand, initial encounter for closed fracture: Secondary | ICD-10-CM | POA: Diagnosis not present

## 2020-02-24 DIAGNOSIS — Z992 Dependence on renal dialysis: Secondary | ICD-10-CM | POA: Diagnosis not present

## 2020-02-24 DIAGNOSIS — I9581 Postprocedural hypotension: Secondary | ICD-10-CM | POA: Diagnosis not present

## 2020-02-24 DIAGNOSIS — S728X2A Other fracture of left femur, initial encounter for closed fracture: Secondary | ICD-10-CM | POA: Diagnosis not present

## 2020-02-24 DIAGNOSIS — E875 Hyperkalemia: Secondary | ICD-10-CM | POA: Diagnosis not present

## 2020-02-24 DIAGNOSIS — D631 Anemia in chronic kidney disease: Secondary | ICD-10-CM | POA: Diagnosis not present

## 2020-02-24 DIAGNOSIS — J449 Chronic obstructive pulmonary disease, unspecified: Secondary | ICD-10-CM | POA: Diagnosis not present

## 2020-02-24 DIAGNOSIS — N189 Chronic kidney disease, unspecified: Secondary | ICD-10-CM | POA: Diagnosis not present

## 2020-02-24 DIAGNOSIS — G4730 Sleep apnea, unspecified: Secondary | ICD-10-CM | POA: Diagnosis not present

## 2020-02-24 DIAGNOSIS — S51812A Laceration without foreign body of left forearm, initial encounter: Secondary | ICD-10-CM | POA: Diagnosis not present

## 2020-02-24 DIAGNOSIS — E1122 Type 2 diabetes mellitus with diabetic chronic kidney disease: Secondary | ICD-10-CM | POA: Diagnosis not present

## 2020-02-24 DIAGNOSIS — T84031A Mechanical loosening of internal left hip prosthetic joint, initial encounter: Secondary | ICD-10-CM | POA: Diagnosis not present

## 2020-02-24 DIAGNOSIS — M9702XA Periprosthetic fracture around internal prosthetic left hip joint, initial encounter: Secondary | ICD-10-CM | POA: Diagnosis not present

## 2020-02-24 DIAGNOSIS — S7222XA Displaced subtrochanteric fracture of left femur, initial encounter for closed fracture: Secondary | ICD-10-CM | POA: Diagnosis not present

## 2020-02-25 DIAGNOSIS — E1122 Type 2 diabetes mellitus with diabetic chronic kidney disease: Secondary | ICD-10-CM | POA: Diagnosis not present

## 2020-02-25 DIAGNOSIS — I120 Hypertensive chronic kidney disease with stage 5 chronic kidney disease or end stage renal disease: Secondary | ICD-10-CM | POA: Diagnosis not present

## 2020-02-25 DIAGNOSIS — Z23 Encounter for immunization: Secondary | ICD-10-CM | POA: Diagnosis not present

## 2020-02-25 DIAGNOSIS — G4730 Sleep apnea, unspecified: Secondary | ICD-10-CM | POA: Diagnosis not present

## 2020-02-25 DIAGNOSIS — E785 Hyperlipidemia, unspecified: Secondary | ICD-10-CM | POA: Diagnosis not present

## 2020-02-25 DIAGNOSIS — T84031A Mechanical loosening of internal left hip prosthetic joint, initial encounter: Secondary | ICD-10-CM | POA: Diagnosis not present

## 2020-02-25 DIAGNOSIS — N186 End stage renal disease: Secondary | ICD-10-CM | POA: Diagnosis not present

## 2020-02-25 DIAGNOSIS — J449 Chronic obstructive pulmonary disease, unspecified: Secondary | ICD-10-CM | POA: Diagnosis not present

## 2020-02-25 DIAGNOSIS — Z992 Dependence on renal dialysis: Secondary | ICD-10-CM | POA: Diagnosis not present

## 2020-02-25 DIAGNOSIS — S62307A Unspecified fracture of fifth metacarpal bone, left hand, initial encounter for closed fracture: Secondary | ICD-10-CM | POA: Diagnosis not present

## 2020-02-25 DIAGNOSIS — S7222XA Displaced subtrochanteric fracture of left femur, initial encounter for closed fracture: Secondary | ICD-10-CM | POA: Diagnosis not present

## 2020-02-25 DIAGNOSIS — I9581 Postprocedural hypotension: Secondary | ICD-10-CM | POA: Diagnosis not present

## 2020-02-25 DIAGNOSIS — K5900 Constipation, unspecified: Secondary | ICD-10-CM | POA: Diagnosis not present

## 2020-02-25 DIAGNOSIS — M9702XA Periprosthetic fracture around internal prosthetic left hip joint, initial encounter: Secondary | ICD-10-CM | POA: Diagnosis not present

## 2020-02-25 DIAGNOSIS — N189 Chronic kidney disease, unspecified: Secondary | ICD-10-CM | POA: Diagnosis not present

## 2020-02-25 DIAGNOSIS — I959 Hypotension, unspecified: Secondary | ICD-10-CM | POA: Diagnosis not present

## 2020-02-25 DIAGNOSIS — E039 Hypothyroidism, unspecified: Secondary | ICD-10-CM | POA: Diagnosis not present

## 2020-02-25 DIAGNOSIS — E559 Vitamin D deficiency, unspecified: Secondary | ICD-10-CM | POA: Diagnosis not present

## 2020-02-25 DIAGNOSIS — D62 Acute posthemorrhagic anemia: Secondary | ICD-10-CM | POA: Diagnosis not present

## 2020-02-25 DIAGNOSIS — J9611 Chronic respiratory failure with hypoxia: Secondary | ICD-10-CM | POA: Diagnosis not present

## 2020-02-25 DIAGNOSIS — S51812A Laceration without foreign body of left forearm, initial encounter: Secondary | ICD-10-CM | POA: Diagnosis not present

## 2020-02-25 DIAGNOSIS — J9621 Acute and chronic respiratory failure with hypoxia: Secondary | ICD-10-CM | POA: Diagnosis not present

## 2020-02-25 DIAGNOSIS — N185 Chronic kidney disease, stage 5: Secondary | ICD-10-CM | POA: Diagnosis not present

## 2020-02-25 DIAGNOSIS — M25512 Pain in left shoulder: Secondary | ICD-10-CM | POA: Diagnosis not present

## 2020-02-25 DIAGNOSIS — G2581 Restless legs syndrome: Secondary | ICD-10-CM | POA: Diagnosis not present

## 2020-02-25 DIAGNOSIS — I480 Paroxysmal atrial fibrillation: Secondary | ICD-10-CM | POA: Diagnosis not present

## 2020-02-25 DIAGNOSIS — E875 Hyperkalemia: Secondary | ICD-10-CM | POA: Diagnosis not present

## 2020-02-25 DIAGNOSIS — D631 Anemia in chronic kidney disease: Secondary | ICD-10-CM | POA: Diagnosis not present

## 2020-02-25 DIAGNOSIS — M1990 Unspecified osteoarthritis, unspecified site: Secondary | ICD-10-CM | POA: Diagnosis not present

## 2020-02-26 ENCOUNTER — Telehealth: Payer: Self-pay

## 2020-02-26 DIAGNOSIS — J449 Chronic obstructive pulmonary disease, unspecified: Secondary | ICD-10-CM | POA: Diagnosis not present

## 2020-02-26 DIAGNOSIS — D62 Acute posthemorrhagic anemia: Secondary | ICD-10-CM | POA: Diagnosis not present

## 2020-02-26 DIAGNOSIS — E875 Hyperkalemia: Secondary | ICD-10-CM | POA: Diagnosis not present

## 2020-02-26 DIAGNOSIS — I120 Hypertensive chronic kidney disease with stage 5 chronic kidney disease or end stage renal disease: Secondary | ICD-10-CM | POA: Diagnosis not present

## 2020-02-26 DIAGNOSIS — M9702XA Periprosthetic fracture around internal prosthetic left hip joint, initial encounter: Secondary | ICD-10-CM | POA: Diagnosis not present

## 2020-02-26 DIAGNOSIS — E785 Hyperlipidemia, unspecified: Secondary | ICD-10-CM | POA: Diagnosis not present

## 2020-02-26 DIAGNOSIS — D631 Anemia in chronic kidney disease: Secondary | ICD-10-CM | POA: Diagnosis not present

## 2020-02-26 DIAGNOSIS — I959 Hypotension, unspecified: Secondary | ICD-10-CM | POA: Diagnosis not present

## 2020-02-26 DIAGNOSIS — S51812A Laceration without foreign body of left forearm, initial encounter: Secondary | ICD-10-CM | POA: Diagnosis not present

## 2020-02-26 DIAGNOSIS — M25512 Pain in left shoulder: Secondary | ICD-10-CM | POA: Diagnosis not present

## 2020-02-26 DIAGNOSIS — E559 Vitamin D deficiency, unspecified: Secondary | ICD-10-CM | POA: Diagnosis not present

## 2020-02-26 DIAGNOSIS — G4730 Sleep apnea, unspecified: Secondary | ICD-10-CM | POA: Diagnosis not present

## 2020-02-26 DIAGNOSIS — N186 End stage renal disease: Secondary | ICD-10-CM | POA: Diagnosis not present

## 2020-02-26 DIAGNOSIS — N189 Chronic kidney disease, unspecified: Secondary | ICD-10-CM | POA: Diagnosis not present

## 2020-02-26 DIAGNOSIS — S62307A Unspecified fracture of fifth metacarpal bone, left hand, initial encounter for closed fracture: Secondary | ICD-10-CM | POA: Diagnosis not present

## 2020-02-26 DIAGNOSIS — Z23 Encounter for immunization: Secondary | ICD-10-CM | POA: Diagnosis not present

## 2020-02-26 DIAGNOSIS — I9581 Postprocedural hypotension: Secondary | ICD-10-CM | POA: Diagnosis not present

## 2020-02-26 DIAGNOSIS — K5900 Constipation, unspecified: Secondary | ICD-10-CM | POA: Diagnosis not present

## 2020-02-26 DIAGNOSIS — G2581 Restless legs syndrome: Secondary | ICD-10-CM | POA: Diagnosis not present

## 2020-02-26 DIAGNOSIS — S728X2A Other fracture of left femur, initial encounter for closed fracture: Secondary | ICD-10-CM | POA: Diagnosis not present

## 2020-02-26 DIAGNOSIS — I480 Paroxysmal atrial fibrillation: Secondary | ICD-10-CM | POA: Diagnosis not present

## 2020-02-26 DIAGNOSIS — S7222XA Displaced subtrochanteric fracture of left femur, initial encounter for closed fracture: Secondary | ICD-10-CM | POA: Diagnosis not present

## 2020-02-26 DIAGNOSIS — I10 Essential (primary) hypertension: Secondary | ICD-10-CM | POA: Diagnosis not present

## 2020-02-26 DIAGNOSIS — E1122 Type 2 diabetes mellitus with diabetic chronic kidney disease: Secondary | ICD-10-CM | POA: Diagnosis not present

## 2020-02-26 DIAGNOSIS — Z992 Dependence on renal dialysis: Secondary | ICD-10-CM | POA: Diagnosis not present

## 2020-02-26 DIAGNOSIS — J9611 Chronic respiratory failure with hypoxia: Secondary | ICD-10-CM | POA: Diagnosis not present

## 2020-02-26 DIAGNOSIS — T84031A Mechanical loosening of internal left hip prosthetic joint, initial encounter: Secondary | ICD-10-CM | POA: Diagnosis not present

## 2020-02-26 DIAGNOSIS — E039 Hypothyroidism, unspecified: Secondary | ICD-10-CM | POA: Diagnosis not present

## 2020-02-26 NOTE — Telephone Encounter (Signed)
Transition Care Management Unsuccessful Follow-up Telephone Call  Date of discharge and from where:  02/26/2020  [facility]  Attempts:  1st Attempt  Reason for unsuccessful TCM follow-up call:  Left voice message with caregiver Cyerra Yim at [phone]

## 2020-02-28 DIAGNOSIS — I480 Paroxysmal atrial fibrillation: Secondary | ICD-10-CM | POA: Diagnosis not present

## 2020-02-28 DIAGNOSIS — Z4901 Encounter for fitting and adjustment of extracorporeal dialysis catheter: Secondary | ICD-10-CM | POA: Diagnosis not present

## 2020-02-28 DIAGNOSIS — N186 End stage renal disease: Secondary | ICD-10-CM | POA: Diagnosis not present

## 2020-02-28 DIAGNOSIS — N2581 Secondary hyperparathyroidism of renal origin: Secondary | ICD-10-CM | POA: Diagnosis not present

## 2020-02-28 DIAGNOSIS — D689 Coagulation defect, unspecified: Secondary | ICD-10-CM | POA: Diagnosis not present

## 2020-02-28 DIAGNOSIS — Z992 Dependence on renal dialysis: Secondary | ICD-10-CM | POA: Diagnosis not present

## 2020-02-28 DIAGNOSIS — D631 Anemia in chronic kidney disease: Secondary | ICD-10-CM | POA: Diagnosis not present

## 2020-03-02 DIAGNOSIS — Z992 Dependence on renal dialysis: Secondary | ICD-10-CM | POA: Diagnosis not present

## 2020-03-02 DIAGNOSIS — N2581 Secondary hyperparathyroidism of renal origin: Secondary | ICD-10-CM | POA: Diagnosis not present

## 2020-03-02 DIAGNOSIS — Z4901 Encounter for fitting and adjustment of extracorporeal dialysis catheter: Secondary | ICD-10-CM | POA: Diagnosis not present

## 2020-03-02 DIAGNOSIS — D631 Anemia in chronic kidney disease: Secondary | ICD-10-CM | POA: Diagnosis not present

## 2020-03-02 DIAGNOSIS — D689 Coagulation defect, unspecified: Secondary | ICD-10-CM | POA: Diagnosis not present

## 2020-03-02 DIAGNOSIS — N186 End stage renal disease: Secondary | ICD-10-CM | POA: Diagnosis not present

## 2020-03-02 NOTE — Telephone Encounter (Signed)
Transition Care Management Unsuccessful Follow-up Telephone Call  Date of discharge and from where:  02/26/2020, [facility]  Attempts:  2nd Attempt  Reason for unsuccessful TCM follow-up call:  Left voice message on answering machine

## 2020-03-03 DIAGNOSIS — N186 End stage renal disease: Secondary | ICD-10-CM | POA: Diagnosis not present

## 2020-03-03 DIAGNOSIS — Z992 Dependence on renal dialysis: Secondary | ICD-10-CM | POA: Diagnosis not present

## 2020-03-03 DIAGNOSIS — E1122 Type 2 diabetes mellitus with diabetic chronic kidney disease: Secondary | ICD-10-CM | POA: Diagnosis not present

## 2020-03-03 DIAGNOSIS — I10 Essential (primary) hypertension: Secondary | ICD-10-CM | POA: Diagnosis not present

## 2020-03-03 NOTE — Telephone Encounter (Signed)
Two attempts have been made - no call back. This encounter will be closed.

## 2020-03-04 DIAGNOSIS — D631 Anemia in chronic kidney disease: Secondary | ICD-10-CM | POA: Diagnosis not present

## 2020-03-04 DIAGNOSIS — Z992 Dependence on renal dialysis: Secondary | ICD-10-CM | POA: Diagnosis not present

## 2020-03-04 DIAGNOSIS — D509 Iron deficiency anemia, unspecified: Secondary | ICD-10-CM | POA: Diagnosis not present

## 2020-03-04 DIAGNOSIS — N2581 Secondary hyperparathyroidism of renal origin: Secondary | ICD-10-CM | POA: Diagnosis not present

## 2020-03-04 DIAGNOSIS — D689 Coagulation defect, unspecified: Secondary | ICD-10-CM | POA: Diagnosis not present

## 2020-03-04 DIAGNOSIS — N186 End stage renal disease: Secondary | ICD-10-CM | POA: Diagnosis not present

## 2020-03-04 DIAGNOSIS — Z4901 Encounter for fitting and adjustment of extracorporeal dialysis catheter: Secondary | ICD-10-CM | POA: Diagnosis not present

## 2020-03-05 ENCOUNTER — Ambulatory Visit: Payer: Medicare Other | Admitting: Nurse Practitioner

## 2020-03-05 DIAGNOSIS — Z4889 Encounter for other specified surgical aftercare: Secondary | ICD-10-CM | POA: Diagnosis not present

## 2020-03-05 DIAGNOSIS — M79642 Pain in left hand: Secondary | ICD-10-CM | POA: Diagnosis not present

## 2020-03-05 DIAGNOSIS — M25552 Pain in left hip: Secondary | ICD-10-CM | POA: Diagnosis not present

## 2020-03-06 DIAGNOSIS — D509 Iron deficiency anemia, unspecified: Secondary | ICD-10-CM | POA: Diagnosis not present

## 2020-03-06 DIAGNOSIS — Z4901 Encounter for fitting and adjustment of extracorporeal dialysis catheter: Secondary | ICD-10-CM | POA: Diagnosis not present

## 2020-03-06 DIAGNOSIS — D631 Anemia in chronic kidney disease: Secondary | ICD-10-CM | POA: Diagnosis not present

## 2020-03-06 DIAGNOSIS — N186 End stage renal disease: Secondary | ICD-10-CM | POA: Diagnosis not present

## 2020-03-06 DIAGNOSIS — D689 Coagulation defect, unspecified: Secondary | ICD-10-CM | POA: Diagnosis not present

## 2020-03-06 DIAGNOSIS — Z992 Dependence on renal dialysis: Secondary | ICD-10-CM | POA: Diagnosis not present

## 2020-03-06 DIAGNOSIS — N2581 Secondary hyperparathyroidism of renal origin: Secondary | ICD-10-CM | POA: Diagnosis not present

## 2020-03-09 DIAGNOSIS — D631 Anemia in chronic kidney disease: Secondary | ICD-10-CM | POA: Diagnosis not present

## 2020-03-09 DIAGNOSIS — N2581 Secondary hyperparathyroidism of renal origin: Secondary | ICD-10-CM | POA: Diagnosis not present

## 2020-03-09 DIAGNOSIS — D509 Iron deficiency anemia, unspecified: Secondary | ICD-10-CM | POA: Diagnosis not present

## 2020-03-09 DIAGNOSIS — Z4901 Encounter for fitting and adjustment of extracorporeal dialysis catheter: Secondary | ICD-10-CM | POA: Diagnosis not present

## 2020-03-09 DIAGNOSIS — N186 End stage renal disease: Secondary | ICD-10-CM | POA: Diagnosis not present

## 2020-03-09 DIAGNOSIS — D689 Coagulation defect, unspecified: Secondary | ICD-10-CM | POA: Diagnosis not present

## 2020-03-09 DIAGNOSIS — Z992 Dependence on renal dialysis: Secondary | ICD-10-CM | POA: Diagnosis not present

## 2020-03-10 ENCOUNTER — Other Ambulatory Visit: Payer: Self-pay

## 2020-03-10 ENCOUNTER — Encounter: Payer: Self-pay | Admitting: Nurse Practitioner

## 2020-03-10 ENCOUNTER — Ambulatory Visit (INDEPENDENT_AMBULATORY_CARE_PROVIDER_SITE_OTHER): Payer: Medicare Other | Admitting: Nurse Practitioner

## 2020-03-10 VITALS — BP 141/81 | HR 88 | Temp 97.6°F | Resp 20 | Ht 65.0 in | Wt 154.0 lb

## 2020-03-10 DIAGNOSIS — E039 Hypothyroidism, unspecified: Secondary | ICD-10-CM

## 2020-03-10 DIAGNOSIS — Z96642 Presence of left artificial hip joint: Secondary | ICD-10-CM

## 2020-03-10 DIAGNOSIS — K219 Gastro-esophageal reflux disease without esophagitis: Secondary | ICD-10-CM | POA: Diagnosis not present

## 2020-03-10 DIAGNOSIS — G2581 Restless legs syndrome: Secondary | ICD-10-CM | POA: Diagnosis not present

## 2020-03-10 DIAGNOSIS — Z23 Encounter for immunization: Secondary | ICD-10-CM | POA: Diagnosis not present

## 2020-03-10 MED ORDER — PANTOPRAZOLE SODIUM 20 MG PO TBEC: 20.0000 mg | DELAYED_RELEASE_TABLET | Freq: Every day | ORAL | 1 refills | Status: DC

## 2020-03-10 MED ORDER — PRAMIPEXOLE DIHYDROCHLORIDE 0.125 MG PO TABS: 0.1250 mg | ORAL_TABLET | Freq: Every day | ORAL | 0 refills | Status: DC

## 2020-03-10 MED ORDER — LEVOTHYROXINE SODIUM 100 MCG PO TABS: 100.0000 ug | ORAL_TABLET | Freq: Every day | ORAL | 3 refills | Status: DC

## 2020-03-10 MED ORDER — NYSTATIN 100000 UNIT/GM EX CREA: 1.0000 "application " | TOPICAL_CREAM | Freq: Two times a day (BID) | CUTANEOUS | 0 refills | Status: DC | PRN

## 2020-03-10 NOTE — Progress Notes (Signed)
   Subjective:    Patient ID: [name], female    DOB: 1940-05-10, 79 y.o.   MRN: [id number]   Chief Complaint: Hospitalization Follow-up   HPI Patient comes in today for hospital follow up . Patient fell in septmeber an broke her hip and had hip replacement. She fell again in November and rebroke her femur on the same side. They say her femur broke and made her fall. She had surgery again and then got an infection. She was recently discharge home and she is not able to walk at all due to pain. Family says she needs home PT.   Review of Systems  Constitutional: Negative for diaphoresis.  Eyes: Negative for pain.  Respiratory: Negative for shortness of breath.   Cardiovascular: Negative for chest pain, palpitations and leg swelling.  Gastrointestinal: Negative for abdominal pain.  Endocrine: Negative for polydipsia.  Skin: Negative for rash.  Neurological: Negative for dizziness, weakness and headaches.  Hematological: Does not bruise/bleed easily.  All other systems reviewed and are negative.      Objective:   Physical Exam Vitals and nursing note reviewed.  Constitutional:      Appearance: Normal appearance.  Cardiovascular:     Rate and Rhythm: Tachycardia present. Rhythm irregular.     Heart sounds: Normal heart sounds.  Pulmonary:     Breath sounds: Normal breath sounds.  Musculoskeletal:     Comments: Patient is in a wheel chair. She is not able to stand at all due to pain in left hip.   Skin:    General: Skin is warm and dry.  Neurological:     General: No focal deficit present.     Mental Status: She is alert and oriented to person, place, and time.  Psychiatric:        Mood and Affect: Mood normal.        Behavior: Behavior normal.    BP (!) 141/81   Pulse 88   Temp 97.6 F (36.4 C) (Temporal)   Resp 20   Ht 5\' 5"  (1.651 m)   Wt 154 lb (69.9 kg)   BMI 25.63 kg/m         Assessment & Plan:  [name] in today with chief complaint of  Hospitalization Follow-up   1. History of left hip hemiarthroplasty [facility] records reviewed - Ambulatory referral to [facility]    The above assessment and management plan was discussed with the patient. The patient verbalized understanding of and has agreed to the management plan. Patient is aware to call the clinic if symptoms persist or worsen. Patient is aware when to return to the clinic for a follow-up visit. Patient educated on when it is appropriate to go to the emergency department.   Mary-Margaret Hassell Done, FNP

## 2020-03-10 NOTE — Addendum Note (Signed)
Addended by: Chevis Pretty on: 03/10/2020 03:33 PM   Modules accepted: Orders

## 2020-03-11 DIAGNOSIS — Z4901 Encounter for fitting and adjustment of extracorporeal dialysis catheter: Secondary | ICD-10-CM | POA: Diagnosis not present

## 2020-03-11 DIAGNOSIS — N186 End stage renal disease: Secondary | ICD-10-CM | POA: Diagnosis not present

## 2020-03-11 DIAGNOSIS — N2581 Secondary hyperparathyroidism of renal origin: Secondary | ICD-10-CM | POA: Diagnosis not present

## 2020-03-11 DIAGNOSIS — D631 Anemia in chronic kidney disease: Secondary | ICD-10-CM | POA: Diagnosis not present

## 2020-03-11 DIAGNOSIS — D689 Coagulation defect, unspecified: Secondary | ICD-10-CM | POA: Diagnosis not present

## 2020-03-11 DIAGNOSIS — D509 Iron deficiency anemia, unspecified: Secondary | ICD-10-CM | POA: Diagnosis not present

## 2020-03-11 DIAGNOSIS — Z992 Dependence on renal dialysis: Secondary | ICD-10-CM | POA: Diagnosis not present

## 2020-03-13 DIAGNOSIS — Z4901 Encounter for fitting and adjustment of extracorporeal dialysis catheter: Secondary | ICD-10-CM | POA: Diagnosis not present

## 2020-03-13 DIAGNOSIS — D509 Iron deficiency anemia, unspecified: Secondary | ICD-10-CM | POA: Diagnosis not present

## 2020-03-13 DIAGNOSIS — D689 Coagulation defect, unspecified: Secondary | ICD-10-CM | POA: Diagnosis not present

## 2020-03-13 DIAGNOSIS — D631 Anemia in chronic kidney disease: Secondary | ICD-10-CM | POA: Diagnosis not present

## 2020-03-13 DIAGNOSIS — Z992 Dependence on renal dialysis: Secondary | ICD-10-CM | POA: Diagnosis not present

## 2020-03-13 DIAGNOSIS — N186 End stage renal disease: Secondary | ICD-10-CM | POA: Diagnosis not present

## 2020-03-13 DIAGNOSIS — N2581 Secondary hyperparathyroidism of renal origin: Secondary | ICD-10-CM | POA: Diagnosis not present

## 2020-03-15 DIAGNOSIS — H5213 Myopia, bilateral: Secondary | ICD-10-CM | POA: Diagnosis not present

## 2020-03-16 DIAGNOSIS — N186 End stage renal disease: Secondary | ICD-10-CM | POA: Diagnosis not present

## 2020-03-16 DIAGNOSIS — D631 Anemia in chronic kidney disease: Secondary | ICD-10-CM | POA: Diagnosis not present

## 2020-03-16 DIAGNOSIS — D689 Coagulation defect, unspecified: Secondary | ICD-10-CM | POA: Diagnosis not present

## 2020-03-16 DIAGNOSIS — Z992 Dependence on renal dialysis: Secondary | ICD-10-CM | POA: Diagnosis not present

## 2020-03-16 DIAGNOSIS — Z4901 Encounter for fitting and adjustment of extracorporeal dialysis catheter: Secondary | ICD-10-CM | POA: Diagnosis not present

## 2020-03-16 DIAGNOSIS — D509 Iron deficiency anemia, unspecified: Secondary | ICD-10-CM | POA: Diagnosis not present

## 2020-03-16 DIAGNOSIS — N2581 Secondary hyperparathyroidism of renal origin: Secondary | ICD-10-CM | POA: Diagnosis not present

## 2020-03-18 DIAGNOSIS — D509 Iron deficiency anemia, unspecified: Secondary | ICD-10-CM | POA: Diagnosis not present

## 2020-03-18 DIAGNOSIS — Z992 Dependence on renal dialysis: Secondary | ICD-10-CM | POA: Diagnosis not present

## 2020-03-18 DIAGNOSIS — D631 Anemia in chronic kidney disease: Secondary | ICD-10-CM | POA: Diagnosis not present

## 2020-03-18 DIAGNOSIS — Z4901 Encounter for fitting and adjustment of extracorporeal dialysis catheter: Secondary | ICD-10-CM | POA: Diagnosis not present

## 2020-03-18 DIAGNOSIS — N186 End stage renal disease: Secondary | ICD-10-CM | POA: Diagnosis not present

## 2020-03-18 DIAGNOSIS — D689 Coagulation defect, unspecified: Secondary | ICD-10-CM | POA: Diagnosis not present

## 2020-03-18 DIAGNOSIS — N2581 Secondary hyperparathyroidism of renal origin: Secondary | ICD-10-CM | POA: Diagnosis not present

## 2020-03-19 ENCOUNTER — Other Ambulatory Visit: Payer: Self-pay

## 2020-03-19 ENCOUNTER — Encounter: Payer: Self-pay | Admitting: Physical Therapy

## 2020-03-19 ENCOUNTER — Ambulatory Visit: Payer: Medicare Other | Attending: Orthopedic Surgery | Admitting: Physical Therapy

## 2020-03-19 DIAGNOSIS — R29898 Other symptoms and signs involving the musculoskeletal system: Secondary | ICD-10-CM | POA: Diagnosis not present

## 2020-03-19 DIAGNOSIS — M25552 Pain in left hip: Secondary | ICD-10-CM | POA: Diagnosis not present

## 2020-03-19 NOTE — Therapy (Signed)
[facility] [place], [place], [address] Phone: [phone]   Fax:  [phone]  Physical Therapy Evaluation  Patient Details  Name: [name] MRN: [id number] Date of Birth: May 15, 1940 Referring Provider (PT): Carollee Massed, [place]   Encounter Date: 03/19/2020   PT End of Session - 03/19/20 1356    Visit Number 1    Number of Visits 12    Date for PT Re-Evaluation 05/07/20    Authorization Type [employer] Medicare ([place] modifier); Progress note every 10th visit    PT Start Time 1300    PT Stop Time 1344    PT Time Calculation (min) 44 min    Equipment Utilized During Treatment Other (comment)   W/C and rolling walker   Activity Tolerance Patient tolerated treatment well    Behavior During Therapy WFL for tasks assessed/performed           Past Medical History:  Diagnosis Date  . Acute respiratory failure ([place]) 05/2017  . Anemia   . Anxiety   . Arthritis   . COPD (chronic obstructive pulmonary disease) ([place])   . Depression   . ESRD (end stage renal disease) ([place])    dialysis MWF  . GERD (gastroesophageal reflux disease)   . Gout   . History of blood transfusion   . History of diabetes mellitus    "diet controlled"  . HLD (hyperlipidemia)   . HOH (hard of hearing)    left ear  . Hypertension    hypotensive -since starting dialysis  . Hypothyroidism   . MRSA (methicillin resistant staph aureus) culture positive 06/01/2017  . PAF (paroxysmal atrial fibrillation) ([place])    a. Echo 11/16:  Mild LVH, EF 55-60%, normal wall motion, MAC, mild MR, severe LAE (49 ml/m2), mild RVE, normal RVSF, mild RAE, mild TR, PASP 24 mmHg;  CHADS2-VASc: 4 >> Coumadin followed by PCP    Past Surgical History:  Procedure Laterality Date  . AV FISTULA PLACEMENT  04/03/2012   Procedure: ARTERIOVENOUS (AV) FISTULA CREATION;  Surgeon: Elam Dutch, MD;  Location: [place];  Service: Vascular;  Laterality: Left;  creation left  brachial cephalic fistula   . BIOPSY  08/21/2018   Procedure: BIOPSY;  Surgeon: Thornton Park, MD;  Location: [place];  Service: Gastroenterology;;  . CARPAL TUNNEL RELEASE Bilateral   . COLONOSCOPY W/ POLYPECTOMY    . COLONOSCOPY WITH PROPOFOL N/A 08/21/2018   Procedure: COLONOSCOPY WITH PROPOFOL;  Surgeon: Thornton Park, MD;  Location: [place];  Service: Gastroenterology;  Laterality: N/A;  . DILATION AND CURETTAGE OF UTERUS    . ESOPHAGOGASTRODUODENOSCOPY (EGD) WITH PROPOFOL N/A 08/21/2018   Procedure: ESOPHAGOGASTRODUODENOSCOPY (EGD) WITH PROPOFOL;  Surgeon: Thornton Park, MD;  Location: [place];  Service: Gastroenterology;  Laterality: N/A;  . EYE SURGERY Bilateral    bilateral cataract removal  . HEMATOMA EVACUATION Left 05/14/2018   Procedure: Incision and Drainage of Left Arm Hematoma;  Surgeon: Elam Dutch, MD;  Location: [facility] OR;  Service: Vascular;  Laterality: Left;  . Hemodialysis  catheter Right   . IR FLUORO GUIDE CV LINE RIGHT  05/26/2017  . IR REMOVAL TUN CV CATH W/O FL  05/24/2017  . IR US GUIDE VASC ACCESS RIGHT  05/26/2017  . LIGATION OF ARTERIOVENOUS  FISTULA Left 02/04/2015   Procedure: LIGATION OF BRACHIOCEPHALIC ARTERIOVENOUS  FISTULA;  Surgeon: Conrad Celada, MD;  Location: [place];  Service: Vascular;  Laterality: Left;  Marland Kitchen MULTIPLE TOOTH EXTRACTIONS    .  POLYPECTOMY  08/21/2018   Procedure: POLYPECTOMY;  Surgeon: Thornton Park, MD;  Location: [place];  Service: Gastroenterology;;  . PORTACATH PLACEMENT    . REVERSE SHOULDER ARTHROPLASTY Left 01/22/2016   Procedure: LEFT REVERSE SHOULDER ARTHROPLASTY;  Surgeon: Netta Cedars, MD;  Location: [place];  Service: Orthopedics;  Laterality: Left;  . SHOULDER ARTHROSCOPY Bilateral   . SPLIT NIGHT STUDY  07/26/2015  . STERIOD INJECTION Right 01/22/2016   Procedure: RIGHT RING FINGER STEROID INJECTION;  Surgeon: Netta Cedars, MD;  Location: [place];  Service: Orthopedics;  Laterality: Right;  . TEE  WITHOUT CARDIOVERSION N/A 05/26/2017   Procedure: TRANSESOPHAGEAL ECHOCARDIOGRAM (TEE);  Surgeon: Lelon Perla, MD;  Location: [place];  Service: Cardiovascular;  Laterality: N/A;  . THROMBECTOMY BRACHIAL ARTERY Left 02/06/2015   Procedure: EVACUATION OF LEFT ARM HEMATOMA;  Surgeon: Angelia Mould, MD;  Location: [place];  Service: Vascular;  Laterality: Left;  . TOTAL KNEE ARTHROPLASTY Bilateral   . TUBAL LIGATION      There were no vitals filed for this visit.    Subjective Assessment - 03/19/20 1405    Subjective COVID-19 screening performed upon arrival. Patient arrives to physical therapy with reports left hip and knee pain and difficulty walking and transfering due to a left ORIF of periprosthetic hip hemiarthroplasty on 02/19/2020. Patient reported her femur broke which caused her to fall. Patient has been PWB for gait. Patient gains assistance as needed from her daughter. Patient requires UE support at left LE for transfers in/out of bed. Patient has been ambulating in the home with rolling walker. Patient utilizes [facility] for outdoor negotiation per MD. Patient reports pain at worst as 10/10 and pain at best as 0/10. Patient's goals are to improve movement, mobility, strength, and walk confidently and safely with rolling walker.    Patient is accompained by: Family member   Daughter, [name]   Pertinent History L posterior hemi-arthroplasty 02/19/2020, A-Fib, COPD, End stage renal disease, history of left reverse TSA, bilateral TKA, Hypothyroidism, bilateral ankle braces, OP (patient reports) Dialysis treatments, OA, HOH.    Limitations Sitting;Standing;Walking;House hold activities    How long can you walk comfortably? Around home with a FWW 50% WB    Patient Stated Goals move around safely and comfortably    Currently in Pain? No/denies              [facility] PT Assessment - 03/19/20 0001      Assessment   Medical Diagnosis Aftercare following surgery for injury and  trauma; histroy of left hip hemiarthroplasty    Referring Provider (PT) Carollee Massed, PA-C    Onset Date/Surgical Date 02/19/20   surgery date   Next MD Visit 03/26/2020    Prior Therapy yes      Precautions   Precautions Posterior Hip    Precaution Comments left posterior hip      Restrictions   Weight Bearing Restrictions Yes    LLE Weight Bearing Weight bearing as tolerated      Balance Screen   Has the patient fallen in the past 6 months Yes    How many times? 2    Has the patient had a decrease in activity level because of a fear of falling?  Yes    Is the patient reluctant to leave their home because of a fear of falling?  No      Home Social worker Private residence    Living Arrangements Children    Available Help at  Discharge Family    Type of [place] entrance      Prior Function   Level of Independence Needs assistance with homemaking;Needs assistance with ADLs;Needs assistance with transfers   supervision for gait with rolling walker     ROM / Strength   AROM / PROM / Strength Strength      Strength   Overall Strength Deficits    Overall Strength Comments hip adduction and abduction performed in sitting    Strength Assessment Site Hip;Knee    Right/Left Hip Right;Left    Right Hip Flexion 3+/5    Right Hip ABduction 3/5    Right Hip ADduction 3/5    Left Hip Flexion 3-/5    Left Hip ABduction 3/5    Left Hip ADduction 3/5    Right/Left Knee Left;Right    Right Knee Flexion 4-/5    Right Knee Extension 4-/5    Left Knee Flexion 3+/5    Left Knee Extension 3+/5      Palpation   Palpation comment tenderness to palpation to left medial knee      Ambulation/Gait   Gait Comments WC negotiation      Balance   Balance Assessed Yes      Static Standing Balance   Static Standing - Balance Support Bilateral upper extremity supported    Static Standing - Level of Assistance 5: Stand by assistance    Static  Standing Balance -  Activities  --   WBOS standing   Static Standing - Comment/# of Minutes 1 minute                      Objective measurements completed on examination: See above findings.               PT Education - 03/19/20 1350    Education Details LAQ, glute squeeze, heel slide, supine marching, walking program WBAT    Person(s) Educated Patient    Methods Explanation;Demonstration;Handout    Comprehension Verbalized understanding               PT Long Term Goals - 03/19/20 1421      PT LONG TERM GOAL #1   Title Patient will be independent with HEP and its progression.    Time 6    Period Weeks    Status New      PT LONG TERM GOAL #2   Title Patient will ambulate 200 feet with rolling walker and step through gait pattern with distant supervision to safely access areas of home.    Baseline 50% WB status at IE    Time 6    Period Weeks    Status New      PT LONG TERM GOAL #3   Title Patient will demonstrate step to step stair negotiation with one railing and min A from family member to safely enter/exit family's homes.    Baseline --    Time 6    Period Weeks    Status New      PT LONG TERM GOAL #4   Title Patient will demonstrate 4/5 or greater bilateral LE MMT to improve stability during functional tasks.    Baseline --    Time 6    Period Weeks    Status New                  Plan - 03/19/20 1412    Clinical Impression Statement Patient  is a 79 year old female who presents to physical therapy with left hip pain, weakness, decreased balance, and difficulty walking secondary to a L ORIF of periprosthetic hip hemiarthroplasty on 02/19/2020. Patient able to stand for 1 minute with decreased L weightbearing and UE support on rolling walker. Patient's posterior incision is dressed therefore unable to assess incision. Patient tender to L medial knee with notable bilateral quadriceps atrophy. Patient and PT discussed HEP and plan of  care. Per referral, patient able to progress to WBAT. Patient would benefit from skilled physical therapy to address deficits and goals.    Personal Factors and Comorbidities Comorbidity 1;Comorbidity 2    Comorbidities L posterior hemi-arthroplasty 02/19/2020, A-Fib, COPD, End stage renal disease, history of left reverse TSA, bilateral TKA Hypothyroidism, bilateral ankle braces, OP (patient reports), DM, Dialysis treatments, OA, HOH.    Examination-Activity Limitations Locomotion Level;Transfers;Other    Stability/Clinical Decision Making Stable/Uncomplicated    Clinical Decision Making Low    Rehab Potential Good    PT Frequency 2x / week    PT Duration 6 weeks    PT Treatment/Interventions ADLs/Self Care Home Management;Functional mobility training;Stair training;Gait training;Therapeutic activities;Therapeutic exercise;Neuromuscular re-education;Balance training;Manual techniques;Passive range of motion;Patient/family education    PT Next Visit Plan left posterior hip precautions, gait training for WBAT, balance activities, transfer activities for left LE. generalized LE strengthening    PT Home Exercise Plan see patient education section    Consulted and Agree with Plan of Care Patient           Patient will benefit from skilled therapeutic intervention in order to improve the following deficits and impairments:  Pain,Abnormal gait,Decreased activity tolerance,Decreased strength,Decreased balance,Difficulty walking,Decreased range of motion  Visit Diagnosis: Pain in left hip  Weakness of left hip     Problem List Patient Active Problem List   Diagnosis Date Noted  . Open wound of left hip 12/19/2019  . Educated about COVID-19 virus infection 08/06/2019  . Controlled substance agreement signed 05/16/2019  . Arthritis 02/14/2019  . Chronic midline thoracic back pain 02/14/2019  . Restless leg syndrome 10/26/2018  . Vitamin D deficiency 10/26/2018  . Age-related osteoporosis  without current pathological fracture 10/26/2018  . Hypertension   . Gout   . COPD (chronic obstructive pulmonary disease) ([place])   . Anxiety   . 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 Adenomatous polyp of descending colon   . Adenomatous polyp of transverse colon   . Depression 08/19/2018  . Pernicious anemia 12/12/2017  . Moderate protein-calorie malnutrition ([place]) 06/02/2017  . Gastroesophageal reflux disease without esophagitis   . Dyslipidemia 02/09/2016  . Do not resuscitate 03/31/2015  . PAF (paroxysmal atrial fibrillation) ([place]) 02/04/2015  . Hypothyroidism   . Anemia in chronic kidney disease 12/09/2014  . Steal syndrome dialysis vascular access ([place]) 12/03/2014  . Secondary hyperparathyroidism of renal origin ([place]) 11/17/2014  . Physical deconditioning 08/04/2013  . Chronic kidney disease due to type 2 diabetes mellitus ([place]) 07/26/2013  . ESRD (end stage renal disease) on dialysis [facility]) 03/15/2012    Gabriela Eves, PT, DPT 03/19/2020, 7:51 PM  [facility] [place], [place], [address] Phone: [phone]   Fax:  [phone]  Name: EMELDA KOHLBECK MRN: [id number] Date of Birth: 16-May-1940

## 2020-03-20 DIAGNOSIS — Z992 Dependence on renal dialysis: Secondary | ICD-10-CM | POA: Diagnosis not present

## 2020-03-20 DIAGNOSIS — N186 End stage renal disease: Secondary | ICD-10-CM | POA: Diagnosis not present

## 2020-03-20 DIAGNOSIS — D689 Coagulation defect, unspecified: Secondary | ICD-10-CM | POA: Diagnosis not present

## 2020-03-20 DIAGNOSIS — N2581 Secondary hyperparathyroidism of renal origin: Secondary | ICD-10-CM | POA: Diagnosis not present

## 2020-03-20 DIAGNOSIS — D509 Iron deficiency anemia, unspecified: Secondary | ICD-10-CM | POA: Diagnosis not present

## 2020-03-20 DIAGNOSIS — Z4901 Encounter for fitting and adjustment of extracorporeal dialysis catheter: Secondary | ICD-10-CM | POA: Diagnosis not present

## 2020-03-20 DIAGNOSIS — D631 Anemia in chronic kidney disease: Secondary | ICD-10-CM | POA: Diagnosis not present

## 2020-03-20 NOTE — Addendum Note (Signed)
Addended by: Gabriela Eves on: 03/20/2020 10:42 AM   Modules accepted: Orders

## 2020-03-23 ENCOUNTER — Other Ambulatory Visit: Payer: Self-pay | Admitting: Nurse Practitioner

## 2020-03-23 DIAGNOSIS — D509 Iron deficiency anemia, unspecified: Secondary | ICD-10-CM | POA: Diagnosis not present

## 2020-03-23 DIAGNOSIS — D631 Anemia in chronic kidney disease: Secondary | ICD-10-CM | POA: Diagnosis not present

## 2020-03-23 DIAGNOSIS — N2581 Secondary hyperparathyroidism of renal origin: Secondary | ICD-10-CM | POA: Diagnosis not present

## 2020-03-23 DIAGNOSIS — D689 Coagulation defect, unspecified: Secondary | ICD-10-CM | POA: Diagnosis not present

## 2020-03-23 DIAGNOSIS — Z992 Dependence on renal dialysis: Secondary | ICD-10-CM | POA: Diagnosis not present

## 2020-03-23 DIAGNOSIS — N186 End stage renal disease: Secondary | ICD-10-CM | POA: Diagnosis not present

## 2020-03-23 DIAGNOSIS — Z4901 Encounter for fitting and adjustment of extracorporeal dialysis catheter: Secondary | ICD-10-CM | POA: Diagnosis not present

## 2020-03-24 ENCOUNTER — Other Ambulatory Visit: Payer: Self-pay

## 2020-03-24 ENCOUNTER — Ambulatory Visit: Payer: Medicare Other | Admitting: *Deleted

## 2020-03-24 DIAGNOSIS — M25552 Pain in left hip: Secondary | ICD-10-CM | POA: Diagnosis not present

## 2020-03-24 DIAGNOSIS — R29898 Other symptoms and signs involving the musculoskeletal system: Secondary | ICD-10-CM | POA: Diagnosis not present

## 2020-03-24 NOTE — Therapy (Signed)
[facility] [place], [place], [address] Phone: [phone]   Fax:  [phone]  Physical Therapy Treatment  Patient Details  Name: [name] MRN: [id number] Date of Birth: 1941-01-07 Referring Provider (PT): Carollee Massed, [place]   Encounter Date: 03/24/2020   PT End of Session - 03/24/20 1127    Visit Number 2    Number of Visits 12    Date for PT Re-Evaluation 05/07/20    Authorization Type [employer] Medicare ([place] modifier); Progress note every 10th visit    PT Start Time 1115    PT Stop Time 1205    PT Time Calculation (min) 50 min           Past Medical History:  Diagnosis Date  . Acute respiratory failure ([place]) 05/2017  . Anemia   . Anxiety   . Arthritis   . COPD (chronic obstructive pulmonary disease) ([place])   . Depression   . ESRD (end stage renal disease) ([place])    dialysis MWF  . GERD (gastroesophageal reflux disease)   . Gout   . History of blood transfusion   . History of diabetes mellitus    "diet controlled"  . HLD (hyperlipidemia)   . HOH (hard of hearing)    left ear  . Hypertension    hypotensive -since starting dialysis  . Hypothyroidism   . MRSA (methicillin resistant staph aureus) culture positive 06/01/2017  . PAF (paroxysmal atrial fibrillation) ([place])    a. Echo 11/16:  Mild LVH, EF 55-60%, normal wall motion, MAC, mild MR, severe LAE (49 ml/m2), mild RVE, normal RVSF, mild RAE, mild TR, PASP 24 mmHg;  CHADS2-VASc: 4 >> Coumadin followed by PCP    Past Surgical History:  Procedure Laterality Date  . AV FISTULA PLACEMENT  04/03/2012   Procedure: ARTERIOVENOUS (AV) FISTULA CREATION;  Surgeon: Elam Dutch, MD;  Location: [facility];  Service: Vascular;  Laterality: Left;  creation left brachial cephalic fistula   . BIOPSY  08/21/2018   Procedure: BIOPSY;  Surgeon: Thornton Park, MD;  Location: [facility];  Service: Gastroenterology;;  . CARPAL TUNNEL RELEASE Bilateral   .  COLONOSCOPY W/ POLYPECTOMY    . COLONOSCOPY WITH PROPOFOL N/A 08/21/2018   Procedure: COLONOSCOPY WITH PROPOFOL;  Surgeon: Thornton Park, MD;  Location: [facility];  Service: Gastroenterology;  Laterality: N/A;  . DILATION AND CURETTAGE OF UTERUS    . ESOPHAGOGASTRODUODENOSCOPY (EGD) WITH PROPOFOL N/A 08/21/2018   Procedure: ESOPHAGOGASTRODUODENOSCOPY (EGD) WITH PROPOFOL;  Surgeon: Thornton Park, MD;  Location: [facility];  Service: Gastroenterology;  Laterality: N/A;  . EYE SURGERY Bilateral    bilateral cataract removal  . HEMATOMA EVACUATION Left 05/14/2018   Procedure: Incision and Drainage of Left Arm Hematoma;  Surgeon: Elam Dutch, MD;  [facility];  Service: Vascular;  Laterality: Left;  . Hemodialysis  catheter Right   . IR FLUORO GUIDE CV LINE RIGHT  05/26/2017  . IR REMOVAL TUN CV CATH W/O FL  05/24/2017  . IR US GUIDE VASC ACCESS RIGHT  05/26/2017  . LIGATION OF ARTERIOVENOUS  FISTULA Left 02/04/2015   Procedure: LIGATION OF BRACHIOCEPHALIC ARTERIOVENOUS  FISTULA;  Surgeon: Conrad Turton, MD;  Location: [facility];  Service: Vascular;  Laterality: Left;  Marland Kitchen MULTIPLE TOOTH EXTRACTIONS    . POLYPECTOMY  08/21/2018   Procedure: POLYPECTOMY;  Surgeon: Thornton Park, MD;  Location: [facility];  Service: Gastroenterology;;  . PORTACATH PLACEMENT    . REVERSE SHOULDER ARTHROPLASTY Left 01/22/2016  Procedure: LEFT REVERSE SHOULDER ARTHROPLASTY;  Surgeon: Netta Cedars, MD;  Location: [place];  Service: Orthopedics;  Laterality: Left;  . SHOULDER ARTHROSCOPY Bilateral   . SPLIT NIGHT STUDY  07/26/2015  . STERIOD INJECTION Right 01/22/2016   Procedure: RIGHT RING FINGER STEROID INJECTION;  Surgeon: Netta Cedars, MD;  Location: [place];  Service: Orthopedics;  Laterality: Right;  . TEE WITHOUT CARDIOVERSION N/A 05/26/2017   Procedure: TRANSESOPHAGEAL ECHOCARDIOGRAM (TEE);  Surgeon: Lelon Perla, MD;  Location: [facility];  Service: Cardiovascular;  Laterality: N/A;  .  THROMBECTOMY BRACHIAL ARTERY Left 02/06/2015   Procedure: EVACUATION OF LEFT ARM HEMATOMA;  Surgeon: Angelia Mould, MD;  Location: [place];  Service: Vascular;  Laterality: Left;  . TOTAL KNEE ARTHROPLASTY Bilateral   . TUBAL LIGATION      There were no vitals filed for this visit.   Subjective Assessment - 03/24/20 1052    Subjective COVID-19 screening performed upon arrival. Patient arrives to physical therapy with reports left hip and knee pain and difficulty walking and transfering due to a left ORIF of periprosthetic hip hemiarthroplasty on 02/19/2020. Aching today.    Pertinent History L posterior hemi-arthroplasty 02/19/2020, A-Fib, COPD, End stage renal disease, history of left reverse TSA, bilateral TKA, Hypothyroidism, bilateral ankle braces, OP (patient reports) Dialysis treatments, OA, HOH.    How long can you walk comfortably? Around home with a FWW 50% WB    Patient Stated Goals move around safely and comfortably    Currently in Pain? Yes    Pain Score 6     Pain Location Hip    Pain Orientation Left    Pain Descriptors / Indicators Sore    Pain Type Surgical pain                             [facility] Adult PT Treatment/Exercise - 03/24/20 0001      Ambulation/Gait   Ambulation Distance (Feet) 12 Feet   and 45 ft   Assistive device Rolling walker    Ambulation Surface Level;Indoor      Exercises   Exercises Knee/Hip      Knee/Hip Exercises: Aerobic   Nustep L3 x22mn UE/LE      Knee/Hip Exercises: Seated   Long Arc Quad 3 sets;10 reps;Both    Long Arc Quad Weight 2 lbs.    Ball Squeeze 3x10    Clamshell with TheraBand Red   3x10   Hamstring Curl Strengthening;Both;20 reps   2x20   Hamstring Limitations red theraband    Sit to Sand with UE support;10 reps                       PT Long Term Goals - 03/19/20 1421      PT LONG TERM GOAL #1   Title Patient will be independent with HEP and its progression.    Time 6    Period  Weeks    Status New      PT LONG TERM GOAL #2   Title Patient will ambulate 200 feet with rolling walker and step through gait pattern with distant supervision to safely access areas of home.    Baseline 50% WB status at IE    Time 6    Period Weeks    Status New      PT LONG TERM GOAL #3   Title Patient will demonstrate step to step stair negotiation with one railing and  min A from family member to safely enter/exit family's homes.    Baseline --    Time 6    Period Weeks    Status New      PT LONG TERM GOAL #4   Title Patient will demonstrate 4/5 or greater bilateral LE MMT to improve stability during functional tasks.    Baseline --    Time 6    Period Weeks    Status New                 Plan - 03/24/20 1402    Clinical Impression Statement Pt arrived today in [facility] and reports WB as tolerated LT LE. Pt was able to ambulate with FWW x12 ft and 45 ft today with gait belt and CGA. Rx also focused on LE strengthening with sit to stand and other seated exs. Great job today with mainly fatigue    Personal Factors and Comorbidities Comorbidity 1;Comorbidity 2    Comorbidities L posterior hemi-arthroplasty 02/19/2020, A-Fib, COPD, End stage renal disease, history of left reverse TSA, bilateral TKA Hypothyroidism, bilateral ankle braces, OP (patient reports), DM, Dialysis treatments, OA, HOH.    Stability/Clinical Decision Making Stable/Uncomplicated    Rehab Potential Good    PT Frequency 2x / week    PT Treatment/Interventions ADLs/Self Care Home Management;Functional mobility training;Stair training;Gait training;Therapeutic activities;Therapeutic exercise;Neuromuscular re-education;Balance training;Manual techniques;Passive range of motion;Patient/family education    PT Next Visit Plan left posterior hip precautions, gait training for WBAT, balance activities, transfer activities for left LE. generalized LE strengthening           Patient will benefit from skilled  therapeutic intervention in order to improve the following deficits and impairments:  Pain,Abnormal gait,Decreased activity tolerance,Decreased strength,Decreased balance,Difficulty walking,Decreased range of motion  Visit Diagnosis: Pain in left hip  Weakness of left hip     Problem List Patient Active Problem List   Diagnosis Date Noted  . Open wound of left hip 12/19/2019  . Educated about COVID-19 virus infection 08/06/2019  . Controlled substance agreement signed 05/16/2019  . Arthritis 02/14/2019  . Chronic midline thoracic back pain 02/14/2019  . Restless leg syndrome 10/26/2018  . Vitamin D deficiency 10/26/2018  . Age-related osteoporosis without current pathological fracture 10/26/2018  . Hypertension   . Gout   . COPD (chronic obstructive pulmonary disease) ([place])   . Anxiety   . 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 Adenomatous polyp of descending colon   . Adenomatous polyp of transverse colon   . Depression 08/19/2018  . Pernicious anemia 12/12/2017  . Moderate protein-calorie malnutrition ([place]) 06/02/2017  . Gastroesophageal reflux disease without esophagitis   . Dyslipidemia 02/09/2016  . Do not resuscitate 03/31/2015  . PAF (paroxysmal atrial fibrillation) ([place]) 02/04/2015  . Hypothyroidism   . Anemia in chronic kidney disease 12/09/2014  . Steal syndrome dialysis vascular access ([place]) 12/03/2014  . Secondary hyperparathyroidism of renal origin ([place]) 11/17/2014  . Physical deconditioning 08/04/2013  . Chronic kidney disease due to type 2 diabetes mellitus ([place]) 07/26/2013  . ESRD (end stage renal disease) on dialysis [facility]) 03/15/2012    Braylon Lemmons,CHRIS, PTA 03/24/2020, 2:11 PM  [facility] [address] [place], [place], [address] Phone: [phone]   Fax:  [phone]  Name: CARRIEANN SPIELBERG MRN: [id number] Date of Birth: Jan 23, 1941

## 2020-03-25 DIAGNOSIS — N186 End stage renal disease: Secondary | ICD-10-CM | POA: Diagnosis not present

## 2020-03-25 DIAGNOSIS — Z4901 Encounter for fitting and adjustment of extracorporeal dialysis catheter: Secondary | ICD-10-CM | POA: Diagnosis not present

## 2020-03-25 DIAGNOSIS — D631 Anemia in chronic kidney disease: Secondary | ICD-10-CM | POA: Diagnosis not present

## 2020-03-25 DIAGNOSIS — D689 Coagulation defect, unspecified: Secondary | ICD-10-CM | POA: Diagnosis not present

## 2020-03-25 DIAGNOSIS — Z992 Dependence on renal dialysis: Secondary | ICD-10-CM | POA: Diagnosis not present

## 2020-03-25 DIAGNOSIS — D509 Iron deficiency anemia, unspecified: Secondary | ICD-10-CM | POA: Diagnosis not present

## 2020-03-25 DIAGNOSIS — N2581 Secondary hyperparathyroidism of renal origin: Secondary | ICD-10-CM | POA: Diagnosis not present

## 2020-03-26 ENCOUNTER — Telehealth: Payer: Self-pay

## 2020-03-26 ENCOUNTER — Ambulatory Visit: Payer: Medicare Other | Admitting: Physical Therapy

## 2020-03-26 ENCOUNTER — Encounter: Payer: Self-pay | Admitting: Physical Therapy

## 2020-03-26 ENCOUNTER — Other Ambulatory Visit: Payer: Self-pay

## 2020-03-26 DIAGNOSIS — M25562 Pain in left knee: Secondary | ICD-10-CM | POA: Diagnosis not present

## 2020-03-26 DIAGNOSIS — M25552 Pain in left hip: Secondary | ICD-10-CM

## 2020-03-26 DIAGNOSIS — M25559 Pain in unspecified hip: Secondary | ICD-10-CM | POA: Diagnosis not present

## 2020-03-26 DIAGNOSIS — M79642 Pain in left hand: Secondary | ICD-10-CM | POA: Diagnosis not present

## 2020-03-26 DIAGNOSIS — R29898 Other symptoms and signs involving the musculoskeletal system: Secondary | ICD-10-CM | POA: Diagnosis not present

## 2020-03-26 NOTE — Telephone Encounter (Signed)
Pt's daughter in law came in to office this morning asking for a referral to neurology. She believes that the pt has been having mini strokes. Pt has no new symptoms since last OV on 12/7. A referral was placed in that visit for home health.   Informed daughter in law that the best thing would be to go to the ER for an evaluation. Since she believes pt is having mini strokes. She was very addiment that she would like Mary-Margaret to go ahead a place the neuro appt. States that they have so many appts today. Pt is scheduled to see orthopedic physician today due to her dragging one leg.  Mary-Margaret,  Will you place the neurology referral without pt being seen?

## 2020-03-26 NOTE — Therapy (Addendum)
[facility] [place], [place], [address] Phone: [phone]   Fax:  [phone]  Physical Therapy Treatment PHYSICAL THERAPY DISCHARGE SUMMARY  Visits from Start of Care: 3  Current functional level related to goals / functional outcomes: See below   Remaining deficits: See goals   Education / Equipment: HEP Plan: Patient agrees to discharge.  Patient goals were not met. Patient is being discharged due to the patient's request.  ?????     Patient Details  Name: [name] MRN: [id number] Date of Birth: March 18, 1941 Referring Provider (PT): Carollee Massed, [place]   Encounter Date: 03/26/2020   PT End of Session - 03/26/20 1101    Visit Number 3    Number of Visits 12    Date for PT Re-Evaluation 05/07/20    Authorization Type [employer] Medicare ([place] modifier); Progress note every 10th visit    PT Start Time 1033    PT Stop Time 1126    PT Time Calculation (min) 53 min    Equipment Utilized During Treatment Gait belt   WC/ and rolling walker   Activity Tolerance Patient tolerated treatment well    Behavior During Therapy WFL for tasks assessed/performed           Past Medical History:  Diagnosis Date  . Acute respiratory failure ([place]) 05/2017  . Anemia   . Anxiety   . Arthritis   . COPD (chronic obstructive pulmonary disease) ([place])   . Depression   . ESRD (end stage renal disease) ([place])    dialysis MWF  . GERD (gastroesophageal reflux disease)   . Gout   . History of blood transfusion   . History of diabetes mellitus    "diet controlled"  . HLD (hyperlipidemia)   . HOH (hard of hearing)    left ear  . Hypertension    hypotensive -since starting dialysis  . Hypothyroidism   . MRSA (methicillin resistant staph aureus) culture positive 06/01/2017  . PAF (paroxysmal atrial fibrillation) ([place])    a. Echo 11/16:  Mild LVH, EF 55-60%, normal wall motion, MAC, mild MR, severe LAE (49 ml/m2), mild  RVE, normal RVSF, mild RAE, mild TR, PASP 24 mmHg;  CHADS2-VASc: 4 >> Coumadin followed by PCP    Past Surgical History:  Procedure Laterality Date  . AV FISTULA PLACEMENT  04/03/2012   Procedure: ARTERIOVENOUS (AV) FISTULA CREATION;  Surgeon: Elam Dutch, MD;  Location: [facility];  Service: Vascular;  Laterality: Left;  creation left brachial cephalic fistula   . BIOPSY  08/21/2018   Procedure: BIOPSY;  Surgeon: Thornton Park, MD;  Location: [facility];  Service: Gastroenterology;;  . CARPAL TUNNEL RELEASE Bilateral   . COLONOSCOPY W/ POLYPECTOMY    . COLONOSCOPY WITH PROPOFOL N/A 08/21/2018   Procedure: COLONOSCOPY WITH PROPOFOL;  Surgeon: Thornton Park, MD;  Location: [place];  Service: Gastroenterology;  Laterality: N/A;  . DILATION AND CURETTAGE OF UTERUS    . ESOPHAGOGASTRODUODENOSCOPY (EGD) WITH PROPOFOL N/A 08/21/2018   Procedure: ESOPHAGOGASTRODUODENOSCOPY (EGD) WITH PROPOFOL;  Surgeon: Thornton Park, MD;  Location: [facility];  Service: Gastroenterology;  Laterality: N/A;  . EYE SURGERY Bilateral    bilateral cataract removal  . HEMATOMA EVACUATION Left 05/14/2018   Procedure: Incision and Drainage of Left Arm Hematoma;  Surgeon: Elam Dutch, MD;  Location: [facility] OR;  Service: Vascular;  Laterality: Left;  . Hemodialysis  catheter Right   . IR FLUORO GUIDE CV LINE RIGHT  05/26/2017  . IR REMOVAL  TUN CV CATH W/O FL  05/24/2017  . IR US GUIDE VASC ACCESS RIGHT  05/26/2017  . LIGATION OF ARTERIOVENOUS  FISTULA Left 02/04/2015   Procedure: LIGATION OF BRACHIOCEPHALIC ARTERIOVENOUS  FISTULA;  Surgeon: Conrad Currie, MD;  Location: [facility];  Service: Vascular;  Laterality: Left;  Marland Kitchen MULTIPLE TOOTH EXTRACTIONS    . POLYPECTOMY  08/21/2018   Procedure: POLYPECTOMY;  Surgeon: Thornton Park, MD;  Location: [facility];  Service: Gastroenterology;;  . PORTACATH PLACEMENT    . REVERSE SHOULDER ARTHROPLASTY Left 01/22/2016   Procedure: LEFT REVERSE SHOULDER ARTHROPLASTY;   Surgeon: Netta Cedars, MD;  Location: [facility];  Service: Orthopedics;  Laterality: Left;  . SHOULDER ARTHROSCOPY Bilateral   . SPLIT NIGHT STUDY  07/26/2015  . STERIOD INJECTION Right 01/22/2016   Procedure: RIGHT RING FINGER STEROID INJECTION;  Surgeon: Netta Cedars, MD;  Location: [facility];  Service: Orthopedics;  Laterality: Right;  . TEE WITHOUT CARDIOVERSION N/A 05/26/2017   Procedure: TRANSESOPHAGEAL ECHOCARDIOGRAM (TEE);  Surgeon: Lelon Perla, MD;  Location: [facility];  Service: Cardiovascular;  Laterality: N/A;  . THROMBECTOMY BRACHIAL ARTERY Left 02/06/2015   Procedure: EVACUATION OF LEFT ARM HEMATOMA;  Surgeon: Angelia Mould, MD;  Location: [facility];  Service: Vascular;  Laterality: Left;  . TOTAL KNEE ARTHROPLASTY Bilateral   . TUBAL LIGATION      There were no vitals filed for this visit.   Subjective Assessment - 03/26/20 1100    Subjective COVID-19 screening performed upon arrival. Patient reports ongoing left LE pain from lateral hip to lateral knee. More pain with bending L knee    Patient is accompained by: Family member    Pertinent History L posterior hemi-arthroplasty 02/19/2020, A-Fib, COPD, End stage renal disease, history of left reverse TSA, bilateral TKA, Hypothyroidism, bilateral ankle braces, OP (patient reports) Dialysis treatments, OA, HOH.    Limitations Sitting;Standing;Walking;House hold activities    How long can you walk comfortably? Around home with a FWW 50% WB    Patient Stated Goals move around safely and comfortably    Currently in Pain? Yes    Pain Score 10-Worst pain ever    Pain Location Knee    Pain Orientation Left    Pain Descriptors / Indicators Sore    Pain Type Surgical pain    Pain Onset More than a month ago              [facility] PT Assessment - 03/26/20 0001      Assessment   Medical Diagnosis Aftercare following surgery for injury and trauma; histroy of left hip hemiarthroplasty                          [facility] Adult PT Treatment/Exercise - 03/26/20 0001      Ambulation/Gait   Ambulation Distance (Feet) 12 Feet   77 feet   Assistive device Rolling walker    Ambulation Surface Level;Indoor    Pre-Gait Activities static standing with UE support on walker for equal weight distribution, followed by lateral weight shifting with manual cuing and verbal cuing followed by L and R LE forward/backward weight shifting for step through pre-gait.      Exercises   Exercises Knee/Hip      Knee/Hip Exercises: Aerobic   Nustep L3 x71mn UE/LE      Knee/Hip Exercises: Seated   Long Arc Quad 3 sets;10 reps;Both    Long Arc Quad Weight 2 lbs.    Ball Squeeze 3x10  Clamshell with TheraBand Red    Hamstring Curl Strengthening;Both;20 reps    Hamstring Limitations red theraband                       PT Long Term Goals - 03/19/20 1421      PT LONG TERM GOAL #1   Title Patient will be independent with HEP and its progression.    Time 6    Period Weeks    Status New      PT LONG TERM GOAL #2   Title Patient will ambulate 200 feet with rolling walker and step through gait pattern with distant supervision to safely access areas of home.    Baseline 50% WB status at IE    Time 6    Period Weeks    Status New      PT LONG TERM GOAL #3   Title Patient will demonstrate step to step stair negotiation with one railing and min A from family member to safely enter/exit family's homes.    Baseline --    Time 6    Period Weeks    Status New      PT LONG TERM GOAL #4   Title Patient will demonstrate 4/5 or greater bilateral LE MMT to improve stability during functional tasks.    Baseline --    Time 6    Period Weeks    Status New                 Plan - 03/26/20 1133    Clinical Impression Statement Patient arrived to physical therapy with reports of L LE pain. Patient guided through strengthening TEs with good carryover of cuing. Patient guided through pre-gait activities with  manual and tactile cuing for adequate weight shifting. Patient ambulated with step to gait pattern with rolling walker x12 feet and x77 feet with gait belt, contact guard, and W/C follow provided by daughter in law. Patient reported fatigue at end of gait training but overall reported feeling like she did well.    Personal Factors and Comorbidities Comorbidity 1;Comorbidity 2    Comorbidities L posterior hemi-arthroplasty 02/19/2020, A-Fib, COPD, End stage renal disease, history of left reverse TSA, bilateral TKA Hypothyroidism, bilateral ankle braces, OP (patient reports), DM, Dialysis treatments, OA, HOH.    Examination-Activity Limitations Locomotion Level;Transfers;Other    Stability/Clinical Decision Making Stable/Uncomplicated    Clinical Decision Making Low    Rehab Potential Good    PT Frequency 2x / week    PT Duration 6 weeks    PT Treatment/Interventions ADLs/Self Care Home Management;Functional mobility training;Stair training;Gait training;Therapeutic activities;Therapeutic exercise;Neuromuscular re-education;Balance training;Manual techniques;Passive range of motion;Patient/family education    PT Next Visit Plan left posterior hip precautions, gait training for WBAT, balance activities, transfer activities for left LE. generalized LE strengthening    PT Home Exercise Plan see patient education section    Consulted and Agree with Plan of Care Patient           Patient will benefit from skilled therapeutic intervention in order to improve the following deficits and impairments:  Pain,Abnormal gait,Decreased activity tolerance,Decreased strength,Decreased balance,Difficulty walking,Decreased range of motion  Visit Diagnosis: Pain in left hip  Weakness of left hip     Problem List Patient Active Problem List   Diagnosis Date Noted  . Open wound of left hip 12/19/2019  . Educated about COVID-19 virus infection 08/06/2019  . Controlled substance agreement signed 05/16/2019   . Arthritis 02/14/2019  .  Chronic midline thoracic back pain 02/14/2019  . Restless leg syndrome 10/26/2018  . Vitamin D deficiency 10/26/2018  . Age-related osteoporosis without current pathological fracture 10/26/2018  . Hypertension   . Gout   . COPD (chronic obstructive pulmonary disease) ([place])   . Anxiety   . 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 Adenomatous polyp of descending colon   . Adenomatous polyp of transverse colon   . Depression 08/19/2018  . Pernicious anemia 12/12/2017  . Moderate protein-calorie malnutrition ([place]) 06/02/2017  . Gastroesophageal reflux disease without esophagitis   . Dyslipidemia 02/09/2016  . Do not resuscitate 03/31/2015  . PAF (paroxysmal atrial fibrillation) ([place]) 02/04/2015  . Hypothyroidism   . Anemia in chronic kidney disease 12/09/2014  . Steal syndrome dialysis vascular access ([place]) 12/03/2014  . Secondary hyperparathyroidism of renal origin ([place]) 11/17/2014  . Physical deconditioning 08/04/2013  . Chronic kidney disease due to type 2 diabetes mellitus ([place]) 07/26/2013  . ESRD (end stage renal disease) on dialysis [facility]) 03/15/2012    Gabriela Eves, PT, DPT 03/26/2020, 11:52 AM  [facility] [place], [place], [address] Phone: [phone]   Fax:  [phone]  Name: [name] MRN: [id number] Date of Birth: December 09, 1940

## 2020-03-26 NOTE — Telephone Encounter (Signed)
I do not know patient will need to be seen for referral. B. [place] patient

## 2020-03-26 NOTE — Telephone Encounter (Signed)
Lmtcb.

## 2020-03-27 DIAGNOSIS — Z4901 Encounter for fitting and adjustment of extracorporeal dialysis catheter: Secondary | ICD-10-CM | POA: Diagnosis not present

## 2020-03-27 DIAGNOSIS — N186 End stage renal disease: Secondary | ICD-10-CM | POA: Diagnosis not present

## 2020-03-27 DIAGNOSIS — N2581 Secondary hyperparathyroidism of renal origin: Secondary | ICD-10-CM | POA: Diagnosis not present

## 2020-03-27 DIAGNOSIS — D631 Anemia in chronic kidney disease: Secondary | ICD-10-CM | POA: Diagnosis not present

## 2020-03-27 DIAGNOSIS — D689 Coagulation defect, unspecified: Secondary | ICD-10-CM | POA: Diagnosis not present

## 2020-03-27 DIAGNOSIS — Z992 Dependence on renal dialysis: Secondary | ICD-10-CM | POA: Diagnosis not present

## 2020-03-27 DIAGNOSIS — D509 Iron deficiency anemia, unspecified: Secondary | ICD-10-CM | POA: Diagnosis not present

## 2020-03-29 DIAGNOSIS — I480 Paroxysmal atrial fibrillation: Secondary | ICD-10-CM | POA: Diagnosis not present

## 2020-03-30 DIAGNOSIS — D509 Iron deficiency anemia, unspecified: Secondary | ICD-10-CM | POA: Diagnosis not present

## 2020-03-30 DIAGNOSIS — N186 End stage renal disease: Secondary | ICD-10-CM | POA: Diagnosis not present

## 2020-03-30 DIAGNOSIS — D689 Coagulation defect, unspecified: Secondary | ICD-10-CM | POA: Diagnosis not present

## 2020-03-30 DIAGNOSIS — Z992 Dependence on renal dialysis: Secondary | ICD-10-CM | POA: Diagnosis not present

## 2020-03-30 DIAGNOSIS — D631 Anemia in chronic kidney disease: Secondary | ICD-10-CM | POA: Diagnosis not present

## 2020-03-30 DIAGNOSIS — Z4901 Encounter for fitting and adjustment of extracorporeal dialysis catheter: Secondary | ICD-10-CM | POA: Diagnosis not present

## 2020-03-30 DIAGNOSIS — N2581 Secondary hyperparathyroidism of renal origin: Secondary | ICD-10-CM | POA: Diagnosis not present

## 2020-03-31 ENCOUNTER — Encounter: Payer: Medicare Other | Admitting: Physical Therapy

## 2020-04-01 DIAGNOSIS — D509 Iron deficiency anemia, unspecified: Secondary | ICD-10-CM | POA: Diagnosis not present

## 2020-04-01 DIAGNOSIS — Z992 Dependence on renal dialysis: Secondary | ICD-10-CM | POA: Diagnosis not present

## 2020-04-01 DIAGNOSIS — Z4901 Encounter for fitting and adjustment of extracorporeal dialysis catheter: Secondary | ICD-10-CM | POA: Diagnosis not present

## 2020-04-01 DIAGNOSIS — D689 Coagulation defect, unspecified: Secondary | ICD-10-CM | POA: Diagnosis not present

## 2020-04-01 DIAGNOSIS — N186 End stage renal disease: Secondary | ICD-10-CM | POA: Diagnosis not present

## 2020-04-01 DIAGNOSIS — D631 Anemia in chronic kidney disease: Secondary | ICD-10-CM | POA: Diagnosis not present

## 2020-04-01 DIAGNOSIS — N2581 Secondary hyperparathyroidism of renal origin: Secondary | ICD-10-CM | POA: Diagnosis not present

## 2020-04-02 ENCOUNTER — Encounter: Payer: Medicare Other | Admitting: Physical Therapy

## 2020-04-03 DIAGNOSIS — D509 Iron deficiency anemia, unspecified: Secondary | ICD-10-CM | POA: Diagnosis not present

## 2020-04-03 DIAGNOSIS — Z4901 Encounter for fitting and adjustment of extracorporeal dialysis catheter: Secondary | ICD-10-CM | POA: Diagnosis not present

## 2020-04-03 DIAGNOSIS — E1122 Type 2 diabetes mellitus with diabetic chronic kidney disease: Secondary | ICD-10-CM | POA: Diagnosis not present

## 2020-04-03 DIAGNOSIS — D689 Coagulation defect, unspecified: Secondary | ICD-10-CM | POA: Diagnosis not present

## 2020-04-03 DIAGNOSIS — N2581 Secondary hyperparathyroidism of renal origin: Secondary | ICD-10-CM | POA: Diagnosis not present

## 2020-04-03 DIAGNOSIS — N186 End stage renal disease: Secondary | ICD-10-CM | POA: Diagnosis not present

## 2020-04-03 DIAGNOSIS — Z992 Dependence on renal dialysis: Secondary | ICD-10-CM | POA: Diagnosis not present

## 2020-04-03 DIAGNOSIS — D631 Anemia in chronic kidney disease: Secondary | ICD-10-CM | POA: Diagnosis not present

## 2020-04-04 DIAGNOSIS — M1990 Unspecified osteoarthritis, unspecified site: Secondary | ICD-10-CM | POA: Diagnosis not present

## 2020-04-04 DIAGNOSIS — J9621 Acute and chronic respiratory failure with hypoxia: Secondary | ICD-10-CM | POA: Diagnosis not present

## 2020-04-04 DIAGNOSIS — I480 Paroxysmal atrial fibrillation: Secondary | ICD-10-CM | POA: Diagnosis not present

## 2020-04-04 DIAGNOSIS — N185 Chronic kidney disease, stage 5: Secondary | ICD-10-CM | POA: Diagnosis not present

## 2020-04-06 DIAGNOSIS — D631 Anemia in chronic kidney disease: Secondary | ICD-10-CM | POA: Diagnosis not present

## 2020-04-06 DIAGNOSIS — Z992 Dependence on renal dialysis: Secondary | ICD-10-CM | POA: Diagnosis not present

## 2020-04-06 DIAGNOSIS — D509 Iron deficiency anemia, unspecified: Secondary | ICD-10-CM | POA: Diagnosis not present

## 2020-04-06 DIAGNOSIS — N185 Chronic kidney disease, stage 5: Secondary | ICD-10-CM | POA: Diagnosis not present

## 2020-04-06 DIAGNOSIS — Z4901 Encounter for fitting and adjustment of extracorporeal dialysis catheter: Secondary | ICD-10-CM | POA: Diagnosis not present

## 2020-04-06 DIAGNOSIS — N186 End stage renal disease: Secondary | ICD-10-CM | POA: Diagnosis not present

## 2020-04-06 DIAGNOSIS — N2581 Secondary hyperparathyroidism of renal origin: Secondary | ICD-10-CM | POA: Diagnosis not present

## 2020-04-06 DIAGNOSIS — E1121 Type 2 diabetes mellitus with diabetic nephropathy: Secondary | ICD-10-CM | POA: Diagnosis not present

## 2020-04-06 DIAGNOSIS — D689 Coagulation defect, unspecified: Secondary | ICD-10-CM | POA: Diagnosis not present

## 2020-04-07 ENCOUNTER — Other Ambulatory Visit: Payer: Self-pay | Admitting: Family Medicine

## 2020-04-07 DIAGNOSIS — M25562 Pain in left knee: Secondary | ICD-10-CM | POA: Diagnosis not present

## 2020-04-07 DIAGNOSIS — K219 Gastro-esophageal reflux disease without esophagitis: Secondary | ICD-10-CM

## 2020-04-07 DIAGNOSIS — M25462 Effusion, left knee: Secondary | ICD-10-CM | POA: Diagnosis not present

## 2020-04-08 DIAGNOSIS — D689 Coagulation defect, unspecified: Secondary | ICD-10-CM | POA: Diagnosis not present

## 2020-04-08 DIAGNOSIS — E1121 Type 2 diabetes mellitus with diabetic nephropathy: Secondary | ICD-10-CM | POA: Diagnosis not present

## 2020-04-08 DIAGNOSIS — N185 Chronic kidney disease, stage 5: Secondary | ICD-10-CM | POA: Diagnosis not present

## 2020-04-08 DIAGNOSIS — D509 Iron deficiency anemia, unspecified: Secondary | ICD-10-CM | POA: Diagnosis not present

## 2020-04-08 DIAGNOSIS — Z992 Dependence on renal dialysis: Secondary | ICD-10-CM | POA: Diagnosis not present

## 2020-04-08 DIAGNOSIS — Z4901 Encounter for fitting and adjustment of extracorporeal dialysis catheter: Secondary | ICD-10-CM | POA: Diagnosis not present

## 2020-04-08 DIAGNOSIS — D631 Anemia in chronic kidney disease: Secondary | ICD-10-CM | POA: Diagnosis not present

## 2020-04-08 DIAGNOSIS — N186 End stage renal disease: Secondary | ICD-10-CM | POA: Diagnosis not present

## 2020-04-08 DIAGNOSIS — N2581 Secondary hyperparathyroidism of renal origin: Secondary | ICD-10-CM | POA: Diagnosis not present

## 2020-04-09 DIAGNOSIS — Z96652 Presence of left artificial knee joint: Secondary | ICD-10-CM | POA: Diagnosis not present

## 2020-04-09 DIAGNOSIS — B351 Tinea unguium: Secondary | ICD-10-CM | POA: Diagnosis not present

## 2020-04-09 DIAGNOSIS — S86812A Strain of other muscle(s) and tendon(s) at lower leg level, left leg, initial encounter: Secondary | ICD-10-CM | POA: Diagnosis not present

## 2020-04-09 DIAGNOSIS — L84 Corns and callosities: Secondary | ICD-10-CM | POA: Diagnosis not present

## 2020-04-09 DIAGNOSIS — I70203 Unspecified atherosclerosis of native arteries of extremities, bilateral legs: Secondary | ICD-10-CM | POA: Diagnosis not present

## 2020-04-09 DIAGNOSIS — M79676 Pain in unspecified toe(s): Secondary | ICD-10-CM | POA: Diagnosis not present

## 2020-04-10 DIAGNOSIS — N186 End stage renal disease: Secondary | ICD-10-CM | POA: Diagnosis not present

## 2020-04-10 DIAGNOSIS — Z4901 Encounter for fitting and adjustment of extracorporeal dialysis catheter: Secondary | ICD-10-CM | POA: Diagnosis not present

## 2020-04-10 DIAGNOSIS — D631 Anemia in chronic kidney disease: Secondary | ICD-10-CM | POA: Diagnosis not present

## 2020-04-10 DIAGNOSIS — D509 Iron deficiency anemia, unspecified: Secondary | ICD-10-CM | POA: Diagnosis not present

## 2020-04-10 DIAGNOSIS — N185 Chronic kidney disease, stage 5: Secondary | ICD-10-CM | POA: Diagnosis not present

## 2020-04-10 DIAGNOSIS — N2581 Secondary hyperparathyroidism of renal origin: Secondary | ICD-10-CM | POA: Diagnosis not present

## 2020-04-10 DIAGNOSIS — E1121 Type 2 diabetes mellitus with diabetic nephropathy: Secondary | ICD-10-CM | POA: Diagnosis not present

## 2020-04-10 DIAGNOSIS — Z992 Dependence on renal dialysis: Secondary | ICD-10-CM | POA: Diagnosis not present

## 2020-04-10 DIAGNOSIS — D689 Coagulation defect, unspecified: Secondary | ICD-10-CM | POA: Diagnosis not present

## 2020-04-10 NOTE — Telephone Encounter (Signed)
Attempted to contact pt without return call in over 3 days, will close encounter. 

## 2020-04-13 DIAGNOSIS — E1121 Type 2 diabetes mellitus with diabetic nephropathy: Secondary | ICD-10-CM | POA: Diagnosis not present

## 2020-04-13 DIAGNOSIS — Z4901 Encounter for fitting and adjustment of extracorporeal dialysis catheter: Secondary | ICD-10-CM | POA: Diagnosis not present

## 2020-04-13 DIAGNOSIS — D631 Anemia in chronic kidney disease: Secondary | ICD-10-CM | POA: Diagnosis not present

## 2020-04-13 DIAGNOSIS — N185 Chronic kidney disease, stage 5: Secondary | ICD-10-CM | POA: Diagnosis not present

## 2020-04-13 DIAGNOSIS — Z992 Dependence on renal dialysis: Secondary | ICD-10-CM | POA: Diagnosis not present

## 2020-04-13 DIAGNOSIS — N2581 Secondary hyperparathyroidism of renal origin: Secondary | ICD-10-CM | POA: Diagnosis not present

## 2020-04-13 DIAGNOSIS — N186 End stage renal disease: Secondary | ICD-10-CM | POA: Diagnosis not present

## 2020-04-13 DIAGNOSIS — D509 Iron deficiency anemia, unspecified: Secondary | ICD-10-CM | POA: Diagnosis not present

## 2020-04-13 DIAGNOSIS — D689 Coagulation defect, unspecified: Secondary | ICD-10-CM | POA: Diagnosis not present

## 2020-04-15 DIAGNOSIS — D631 Anemia in chronic kidney disease: Secondary | ICD-10-CM | POA: Diagnosis not present

## 2020-04-15 DIAGNOSIS — D689 Coagulation defect, unspecified: Secondary | ICD-10-CM | POA: Diagnosis not present

## 2020-04-15 DIAGNOSIS — N186 End stage renal disease: Secondary | ICD-10-CM | POA: Diagnosis not present

## 2020-04-15 DIAGNOSIS — Z992 Dependence on renal dialysis: Secondary | ICD-10-CM | POA: Diagnosis not present

## 2020-04-15 DIAGNOSIS — Z4901 Encounter for fitting and adjustment of extracorporeal dialysis catheter: Secondary | ICD-10-CM | POA: Diagnosis not present

## 2020-04-15 DIAGNOSIS — E1121 Type 2 diabetes mellitus with diabetic nephropathy: Secondary | ICD-10-CM | POA: Diagnosis not present

## 2020-04-15 DIAGNOSIS — N185 Chronic kidney disease, stage 5: Secondary | ICD-10-CM | POA: Diagnosis not present

## 2020-04-15 DIAGNOSIS — N2581 Secondary hyperparathyroidism of renal origin: Secondary | ICD-10-CM | POA: Diagnosis not present

## 2020-04-15 DIAGNOSIS — D509 Iron deficiency anemia, unspecified: Secondary | ICD-10-CM | POA: Diagnosis not present

## 2020-04-17 DIAGNOSIS — D631 Anemia in chronic kidney disease: Secondary | ICD-10-CM | POA: Diagnosis not present

## 2020-04-17 DIAGNOSIS — Z992 Dependence on renal dialysis: Secondary | ICD-10-CM | POA: Diagnosis not present

## 2020-04-17 DIAGNOSIS — Z4901 Encounter for fitting and adjustment of extracorporeal dialysis catheter: Secondary | ICD-10-CM | POA: Diagnosis not present

## 2020-04-17 DIAGNOSIS — D509 Iron deficiency anemia, unspecified: Secondary | ICD-10-CM | POA: Diagnosis not present

## 2020-04-17 DIAGNOSIS — N2581 Secondary hyperparathyroidism of renal origin: Secondary | ICD-10-CM | POA: Diagnosis not present

## 2020-04-17 DIAGNOSIS — N185 Chronic kidney disease, stage 5: Secondary | ICD-10-CM | POA: Diagnosis not present

## 2020-04-17 DIAGNOSIS — D689 Coagulation defect, unspecified: Secondary | ICD-10-CM | POA: Diagnosis not present

## 2020-04-17 DIAGNOSIS — N186 End stage renal disease: Secondary | ICD-10-CM | POA: Diagnosis not present

## 2020-04-17 DIAGNOSIS — E1121 Type 2 diabetes mellitus with diabetic nephropathy: Secondary | ICD-10-CM | POA: Diagnosis not present

## 2020-04-20 DIAGNOSIS — D689 Coagulation defect, unspecified: Secondary | ICD-10-CM | POA: Diagnosis not present

## 2020-04-20 DIAGNOSIS — N185 Chronic kidney disease, stage 5: Secondary | ICD-10-CM | POA: Diagnosis not present

## 2020-04-20 DIAGNOSIS — E1121 Type 2 diabetes mellitus with diabetic nephropathy: Secondary | ICD-10-CM | POA: Diagnosis not present

## 2020-04-20 DIAGNOSIS — D631 Anemia in chronic kidney disease: Secondary | ICD-10-CM | POA: Diagnosis not present

## 2020-04-20 DIAGNOSIS — Z992 Dependence on renal dialysis: Secondary | ICD-10-CM | POA: Diagnosis not present

## 2020-04-20 DIAGNOSIS — N186 End stage renal disease: Secondary | ICD-10-CM | POA: Diagnosis not present

## 2020-04-20 DIAGNOSIS — Z4901 Encounter for fitting and adjustment of extracorporeal dialysis catheter: Secondary | ICD-10-CM | POA: Diagnosis not present

## 2020-04-20 DIAGNOSIS — N2581 Secondary hyperparathyroidism of renal origin: Secondary | ICD-10-CM | POA: Diagnosis not present

## 2020-04-20 DIAGNOSIS — D509 Iron deficiency anemia, unspecified: Secondary | ICD-10-CM | POA: Diagnosis not present

## 2020-04-22 DIAGNOSIS — Z4901 Encounter for fitting and adjustment of extracorporeal dialysis catheter: Secondary | ICD-10-CM | POA: Diagnosis not present

## 2020-04-22 DIAGNOSIS — N186 End stage renal disease: Secondary | ICD-10-CM | POA: Diagnosis not present

## 2020-04-22 DIAGNOSIS — D509 Iron deficiency anemia, unspecified: Secondary | ICD-10-CM | POA: Diagnosis not present

## 2020-04-22 DIAGNOSIS — N2581 Secondary hyperparathyroidism of renal origin: Secondary | ICD-10-CM | POA: Diagnosis not present

## 2020-04-22 DIAGNOSIS — Z992 Dependence on renal dialysis: Secondary | ICD-10-CM | POA: Diagnosis not present

## 2020-04-22 DIAGNOSIS — E1121 Type 2 diabetes mellitus with diabetic nephropathy: Secondary | ICD-10-CM | POA: Diagnosis not present

## 2020-04-22 DIAGNOSIS — D631 Anemia in chronic kidney disease: Secondary | ICD-10-CM | POA: Diagnosis not present

## 2020-04-22 DIAGNOSIS — N185 Chronic kidney disease, stage 5: Secondary | ICD-10-CM | POA: Diagnosis not present

## 2020-04-22 DIAGNOSIS — D689 Coagulation defect, unspecified: Secondary | ICD-10-CM | POA: Diagnosis not present

## 2020-04-24 DIAGNOSIS — N2581 Secondary hyperparathyroidism of renal origin: Secondary | ICD-10-CM | POA: Diagnosis not present

## 2020-04-24 DIAGNOSIS — D689 Coagulation defect, unspecified: Secondary | ICD-10-CM | POA: Diagnosis not present

## 2020-04-24 DIAGNOSIS — N185 Chronic kidney disease, stage 5: Secondary | ICD-10-CM | POA: Diagnosis not present

## 2020-04-24 DIAGNOSIS — Z4901 Encounter for fitting and adjustment of extracorporeal dialysis catheter: Secondary | ICD-10-CM | POA: Diagnosis not present

## 2020-04-24 DIAGNOSIS — N186 End stage renal disease: Secondary | ICD-10-CM | POA: Diagnosis not present

## 2020-04-24 DIAGNOSIS — E1121 Type 2 diabetes mellitus with diabetic nephropathy: Secondary | ICD-10-CM | POA: Diagnosis not present

## 2020-04-24 DIAGNOSIS — Z992 Dependence on renal dialysis: Secondary | ICD-10-CM | POA: Diagnosis not present

## 2020-04-24 DIAGNOSIS — D509 Iron deficiency anemia, unspecified: Secondary | ICD-10-CM | POA: Diagnosis not present

## 2020-04-24 DIAGNOSIS — D631 Anemia in chronic kidney disease: Secondary | ICD-10-CM | POA: Diagnosis not present

## 2020-04-27 DIAGNOSIS — N2581 Secondary hyperparathyroidism of renal origin: Secondary | ICD-10-CM | POA: Diagnosis not present

## 2020-04-27 DIAGNOSIS — D689 Coagulation defect, unspecified: Secondary | ICD-10-CM | POA: Diagnosis not present

## 2020-04-27 DIAGNOSIS — D631 Anemia in chronic kidney disease: Secondary | ICD-10-CM | POA: Diagnosis not present

## 2020-04-27 DIAGNOSIS — D509 Iron deficiency anemia, unspecified: Secondary | ICD-10-CM | POA: Diagnosis not present

## 2020-04-27 DIAGNOSIS — E1121 Type 2 diabetes mellitus with diabetic nephropathy: Secondary | ICD-10-CM | POA: Diagnosis not present

## 2020-04-27 DIAGNOSIS — N186 End stage renal disease: Secondary | ICD-10-CM | POA: Diagnosis not present

## 2020-04-27 DIAGNOSIS — Z992 Dependence on renal dialysis: Secondary | ICD-10-CM | POA: Diagnosis not present

## 2020-04-27 DIAGNOSIS — Z4901 Encounter for fitting and adjustment of extracorporeal dialysis catheter: Secondary | ICD-10-CM | POA: Diagnosis not present

## 2020-04-27 DIAGNOSIS — N185 Chronic kidney disease, stage 5: Secondary | ICD-10-CM | POA: Diagnosis not present

## 2020-04-29 DIAGNOSIS — I480 Paroxysmal atrial fibrillation: Secondary | ICD-10-CM | POA: Diagnosis not present

## 2020-04-29 DIAGNOSIS — E1121 Type 2 diabetes mellitus with diabetic nephropathy: Secondary | ICD-10-CM | POA: Diagnosis not present

## 2020-04-29 DIAGNOSIS — D689 Coagulation defect, unspecified: Secondary | ICD-10-CM | POA: Diagnosis not present

## 2020-04-29 DIAGNOSIS — Z992 Dependence on renal dialysis: Secondary | ICD-10-CM | POA: Diagnosis not present

## 2020-04-29 DIAGNOSIS — N2581 Secondary hyperparathyroidism of renal origin: Secondary | ICD-10-CM | POA: Diagnosis not present

## 2020-04-29 DIAGNOSIS — D509 Iron deficiency anemia, unspecified: Secondary | ICD-10-CM | POA: Diagnosis not present

## 2020-04-29 DIAGNOSIS — Z4901 Encounter for fitting and adjustment of extracorporeal dialysis catheter: Secondary | ICD-10-CM | POA: Diagnosis not present

## 2020-04-29 DIAGNOSIS — N185 Chronic kidney disease, stage 5: Secondary | ICD-10-CM | POA: Diagnosis not present

## 2020-04-29 DIAGNOSIS — D631 Anemia in chronic kidney disease: Secondary | ICD-10-CM | POA: Diagnosis not present

## 2020-04-29 DIAGNOSIS — N186 End stage renal disease: Secondary | ICD-10-CM | POA: Diagnosis not present

## 2020-04-30 DIAGNOSIS — Q682 Congenital deformity of knee: Secondary | ICD-10-CM | POA: Diagnosis not present

## 2020-04-30 DIAGNOSIS — T8459XD Infection and inflammatory reaction due to other internal joint prosthesis, subsequent encounter: Secondary | ICD-10-CM | POA: Diagnosis not present

## 2020-04-30 DIAGNOSIS — N186 End stage renal disease: Secondary | ICD-10-CM | POA: Diagnosis not present

## 2020-04-30 DIAGNOSIS — T8452XD Infection and inflammatory reaction due to internal left hip prosthesis, subsequent encounter: Secondary | ICD-10-CM | POA: Diagnosis not present

## 2020-04-30 DIAGNOSIS — Z96649 Presence of unspecified artificial hip joint: Secondary | ICD-10-CM | POA: Diagnosis not present

## 2020-04-30 DIAGNOSIS — A498 Other bacterial infections of unspecified site: Secondary | ICD-10-CM | POA: Diagnosis not present

## 2020-05-01 DIAGNOSIS — E1121 Type 2 diabetes mellitus with diabetic nephropathy: Secondary | ICD-10-CM | POA: Diagnosis not present

## 2020-05-01 DIAGNOSIS — N186 End stage renal disease: Secondary | ICD-10-CM | POA: Diagnosis not present

## 2020-05-01 DIAGNOSIS — N2581 Secondary hyperparathyroidism of renal origin: Secondary | ICD-10-CM | POA: Diagnosis not present

## 2020-05-01 DIAGNOSIS — D689 Coagulation defect, unspecified: Secondary | ICD-10-CM | POA: Diagnosis not present

## 2020-05-01 DIAGNOSIS — D631 Anemia in chronic kidney disease: Secondary | ICD-10-CM | POA: Diagnosis not present

## 2020-05-01 DIAGNOSIS — N185 Chronic kidney disease, stage 5: Secondary | ICD-10-CM | POA: Diagnosis not present

## 2020-05-01 DIAGNOSIS — D509 Iron deficiency anemia, unspecified: Secondary | ICD-10-CM | POA: Diagnosis not present

## 2020-05-01 DIAGNOSIS — Z992 Dependence on renal dialysis: Secondary | ICD-10-CM | POA: Diagnosis not present

## 2020-05-01 DIAGNOSIS — Z4901 Encounter for fitting and adjustment of extracorporeal dialysis catheter: Secondary | ICD-10-CM | POA: Diagnosis not present

## 2020-05-04 DIAGNOSIS — Z4901 Encounter for fitting and adjustment of extracorporeal dialysis catheter: Secondary | ICD-10-CM | POA: Diagnosis not present

## 2020-05-04 DIAGNOSIS — N186 End stage renal disease: Secondary | ICD-10-CM | POA: Diagnosis not present

## 2020-05-04 DIAGNOSIS — E1122 Type 2 diabetes mellitus with diabetic chronic kidney disease: Secondary | ICD-10-CM | POA: Diagnosis not present

## 2020-05-04 DIAGNOSIS — N2581 Secondary hyperparathyroidism of renal origin: Secondary | ICD-10-CM | POA: Diagnosis not present

## 2020-05-04 DIAGNOSIS — D509 Iron deficiency anemia, unspecified: Secondary | ICD-10-CM | POA: Diagnosis not present

## 2020-05-04 DIAGNOSIS — D631 Anemia in chronic kidney disease: Secondary | ICD-10-CM | POA: Diagnosis not present

## 2020-05-04 DIAGNOSIS — D689 Coagulation defect, unspecified: Secondary | ICD-10-CM | POA: Diagnosis not present

## 2020-05-04 DIAGNOSIS — N185 Chronic kidney disease, stage 5: Secondary | ICD-10-CM | POA: Diagnosis not present

## 2020-05-04 DIAGNOSIS — E1121 Type 2 diabetes mellitus with diabetic nephropathy: Secondary | ICD-10-CM | POA: Diagnosis not present

## 2020-05-04 DIAGNOSIS — Z992 Dependence on renal dialysis: Secondary | ICD-10-CM | POA: Diagnosis not present

## 2020-05-06 DIAGNOSIS — R519 Headache, unspecified: Secondary | ICD-10-CM | POA: Diagnosis not present

## 2020-05-06 DIAGNOSIS — N2581 Secondary hyperparathyroidism of renal origin: Secondary | ICD-10-CM | POA: Diagnosis not present

## 2020-05-06 DIAGNOSIS — Z992 Dependence on renal dialysis: Secondary | ICD-10-CM | POA: Diagnosis not present

## 2020-05-06 DIAGNOSIS — N186 End stage renal disease: Secondary | ICD-10-CM | POA: Diagnosis not present

## 2020-05-06 DIAGNOSIS — D689 Coagulation defect, unspecified: Secondary | ICD-10-CM | POA: Diagnosis not present

## 2020-05-06 DIAGNOSIS — Z23 Encounter for immunization: Secondary | ICD-10-CM | POA: Diagnosis not present

## 2020-05-06 DIAGNOSIS — Z4901 Encounter for fitting and adjustment of extracorporeal dialysis catheter: Secondary | ICD-10-CM | POA: Diagnosis not present

## 2020-05-06 DIAGNOSIS — D509 Iron deficiency anemia, unspecified: Secondary | ICD-10-CM | POA: Diagnosis not present

## 2020-05-06 DIAGNOSIS — D631 Anemia in chronic kidney disease: Secondary | ICD-10-CM | POA: Diagnosis not present

## 2020-05-08 DIAGNOSIS — N186 End stage renal disease: Secondary | ICD-10-CM | POA: Diagnosis not present

## 2020-05-08 DIAGNOSIS — D509 Iron deficiency anemia, unspecified: Secondary | ICD-10-CM | POA: Diagnosis not present

## 2020-05-08 DIAGNOSIS — N2581 Secondary hyperparathyroidism of renal origin: Secondary | ICD-10-CM | POA: Diagnosis not present

## 2020-05-08 DIAGNOSIS — Z4901 Encounter for fitting and adjustment of extracorporeal dialysis catheter: Secondary | ICD-10-CM | POA: Diagnosis not present

## 2020-05-08 DIAGNOSIS — Z23 Encounter for immunization: Secondary | ICD-10-CM | POA: Diagnosis not present

## 2020-05-08 DIAGNOSIS — R519 Headache, unspecified: Secondary | ICD-10-CM | POA: Diagnosis not present

## 2020-05-08 DIAGNOSIS — D689 Coagulation defect, unspecified: Secondary | ICD-10-CM | POA: Diagnosis not present

## 2020-05-08 DIAGNOSIS — D631 Anemia in chronic kidney disease: Secondary | ICD-10-CM | POA: Diagnosis not present

## 2020-05-08 DIAGNOSIS — Z992 Dependence on renal dialysis: Secondary | ICD-10-CM | POA: Diagnosis not present

## 2020-05-11 DIAGNOSIS — N2581 Secondary hyperparathyroidism of renal origin: Secondary | ICD-10-CM | POA: Diagnosis not present

## 2020-05-11 DIAGNOSIS — D631 Anemia in chronic kidney disease: Secondary | ICD-10-CM | POA: Diagnosis not present

## 2020-05-11 DIAGNOSIS — Z4901 Encounter for fitting and adjustment of extracorporeal dialysis catheter: Secondary | ICD-10-CM | POA: Diagnosis not present

## 2020-05-11 DIAGNOSIS — D689 Coagulation defect, unspecified: Secondary | ICD-10-CM | POA: Diagnosis not present

## 2020-05-11 DIAGNOSIS — Z23 Encounter for immunization: Secondary | ICD-10-CM | POA: Diagnosis not present

## 2020-05-11 DIAGNOSIS — Z992 Dependence on renal dialysis: Secondary | ICD-10-CM | POA: Diagnosis not present

## 2020-05-11 DIAGNOSIS — R519 Headache, unspecified: Secondary | ICD-10-CM | POA: Diagnosis not present

## 2020-05-11 DIAGNOSIS — D509 Iron deficiency anemia, unspecified: Secondary | ICD-10-CM | POA: Diagnosis not present

## 2020-05-11 DIAGNOSIS — N186 End stage renal disease: Secondary | ICD-10-CM | POA: Diagnosis not present

## 2020-05-13 DIAGNOSIS — Z4901 Encounter for fitting and adjustment of extracorporeal dialysis catheter: Secondary | ICD-10-CM | POA: Diagnosis not present

## 2020-05-13 DIAGNOSIS — Z992 Dependence on renal dialysis: Secondary | ICD-10-CM | POA: Diagnosis not present

## 2020-05-13 DIAGNOSIS — Z23 Encounter for immunization: Secondary | ICD-10-CM | POA: Diagnosis not present

## 2020-05-13 DIAGNOSIS — R519 Headache, unspecified: Secondary | ICD-10-CM | POA: Diagnosis not present

## 2020-05-13 DIAGNOSIS — N186 End stage renal disease: Secondary | ICD-10-CM | POA: Diagnosis not present

## 2020-05-13 DIAGNOSIS — D631 Anemia in chronic kidney disease: Secondary | ICD-10-CM | POA: Diagnosis not present

## 2020-05-13 DIAGNOSIS — N2581 Secondary hyperparathyroidism of renal origin: Secondary | ICD-10-CM | POA: Diagnosis not present

## 2020-05-13 DIAGNOSIS — D689 Coagulation defect, unspecified: Secondary | ICD-10-CM | POA: Diagnosis not present

## 2020-05-13 DIAGNOSIS — D509 Iron deficiency anemia, unspecified: Secondary | ICD-10-CM | POA: Diagnosis not present

## 2020-05-14 DIAGNOSIS — Z96642 Presence of left artificial hip joint: Secondary | ICD-10-CM | POA: Diagnosis not present

## 2020-05-15 DIAGNOSIS — Z4901 Encounter for fitting and adjustment of extracorporeal dialysis catheter: Secondary | ICD-10-CM | POA: Diagnosis not present

## 2020-05-15 DIAGNOSIS — Z992 Dependence on renal dialysis: Secondary | ICD-10-CM | POA: Diagnosis not present

## 2020-05-15 DIAGNOSIS — D509 Iron deficiency anemia, unspecified: Secondary | ICD-10-CM | POA: Diagnosis not present

## 2020-05-15 DIAGNOSIS — D631 Anemia in chronic kidney disease: Secondary | ICD-10-CM | POA: Diagnosis not present

## 2020-05-15 DIAGNOSIS — Z23 Encounter for immunization: Secondary | ICD-10-CM | POA: Diagnosis not present

## 2020-05-15 DIAGNOSIS — R519 Headache, unspecified: Secondary | ICD-10-CM | POA: Diagnosis not present

## 2020-05-15 DIAGNOSIS — N2581 Secondary hyperparathyroidism of renal origin: Secondary | ICD-10-CM | POA: Diagnosis not present

## 2020-05-15 DIAGNOSIS — N186 End stage renal disease: Secondary | ICD-10-CM | POA: Diagnosis not present

## 2020-05-15 DIAGNOSIS — D689 Coagulation defect, unspecified: Secondary | ICD-10-CM | POA: Diagnosis not present

## 2020-05-18 DIAGNOSIS — Z4901 Encounter for fitting and adjustment of extracorporeal dialysis catheter: Secondary | ICD-10-CM | POA: Diagnosis not present

## 2020-05-18 DIAGNOSIS — R519 Headache, unspecified: Secondary | ICD-10-CM | POA: Diagnosis not present

## 2020-05-18 DIAGNOSIS — D689 Coagulation defect, unspecified: Secondary | ICD-10-CM | POA: Diagnosis not present

## 2020-05-18 DIAGNOSIS — N186 End stage renal disease: Secondary | ICD-10-CM | POA: Diagnosis not present

## 2020-05-18 DIAGNOSIS — D509 Iron deficiency anemia, unspecified: Secondary | ICD-10-CM | POA: Diagnosis not present

## 2020-05-18 DIAGNOSIS — N2581 Secondary hyperparathyroidism of renal origin: Secondary | ICD-10-CM | POA: Diagnosis not present

## 2020-05-18 DIAGNOSIS — D631 Anemia in chronic kidney disease: Secondary | ICD-10-CM | POA: Diagnosis not present

## 2020-05-18 DIAGNOSIS — Z992 Dependence on renal dialysis: Secondary | ICD-10-CM | POA: Diagnosis not present

## 2020-05-18 DIAGNOSIS — Z23 Encounter for immunization: Secondary | ICD-10-CM | POA: Diagnosis not present

## 2020-05-19 ENCOUNTER — Other Ambulatory Visit: Payer: Self-pay

## 2020-05-19 ENCOUNTER — Ambulatory Visit (INDEPENDENT_AMBULATORY_CARE_PROVIDER_SITE_OTHER): Payer: Medicare Other | Admitting: Family Medicine

## 2020-05-19 ENCOUNTER — Encounter: Payer: Self-pay | Admitting: Family Medicine

## 2020-05-19 VITALS — BP 110/72 | HR 94 | Temp 98.8°F | Ht 65.0 in | Wt 144.0 lb

## 2020-05-19 DIAGNOSIS — R4781 Slurred speech: Secondary | ICD-10-CM

## 2020-05-19 DIAGNOSIS — N186 End stage renal disease: Secondary | ICD-10-CM | POA: Diagnosis not present

## 2020-05-19 DIAGNOSIS — Z79899 Other long term (current) drug therapy: Secondary | ICD-10-CM

## 2020-05-19 DIAGNOSIS — R410 Disorientation, unspecified: Secondary | ICD-10-CM

## 2020-05-19 DIAGNOSIS — M1990 Unspecified osteoarthritis, unspecified site: Secondary | ICD-10-CM

## 2020-05-19 DIAGNOSIS — G8929 Other chronic pain: Secondary | ICD-10-CM | POA: Diagnosis not present

## 2020-05-19 DIAGNOSIS — K219 Gastro-esophageal reflux disease without esophagitis: Secondary | ICD-10-CM

## 2020-05-19 DIAGNOSIS — I480 Paroxysmal atrial fibrillation: Secondary | ICD-10-CM

## 2020-05-19 DIAGNOSIS — M810 Age-related osteoporosis without current pathological fracture: Secondary | ICD-10-CM

## 2020-05-19 DIAGNOSIS — M546 Pain in thoracic spine: Secondary | ICD-10-CM

## 2020-05-19 DIAGNOSIS — Z789 Other specified health status: Secondary | ICD-10-CM

## 2020-05-19 DIAGNOSIS — E039 Hypothyroidism, unspecified: Secondary | ICD-10-CM

## 2020-05-19 DIAGNOSIS — I10 Essential (primary) hypertension: Secondary | ICD-10-CM | POA: Diagnosis not present

## 2020-05-19 DIAGNOSIS — E785 Hyperlipidemia, unspecified: Secondary | ICD-10-CM | POA: Diagnosis not present

## 2020-05-19 DIAGNOSIS — Z992 Dependence on renal dialysis: Secondary | ICD-10-CM

## 2020-05-19 DIAGNOSIS — J449 Chronic obstructive pulmonary disease, unspecified: Secondary | ICD-10-CM

## 2020-05-19 DIAGNOSIS — G2581 Restless legs syndrome: Secondary | ICD-10-CM

## 2020-05-19 DIAGNOSIS — E559 Vitamin D deficiency, unspecified: Secondary | ICD-10-CM

## 2020-05-19 DIAGNOSIS — R531 Weakness: Secondary | ICD-10-CM

## 2020-05-19 DIAGNOSIS — D631 Anemia in chronic kidney disease: Secondary | ICD-10-CM

## 2020-05-19 MED ORDER — ALBUTEROL SULFATE HFA 108 (90 BASE) MCG/ACT IN AERS
2.0000 | INHALATION_SPRAY | Freq: Four times a day (QID) | RESPIRATORY_TRACT | 2 refills | Status: DC | PRN
Start: 2020-05-19 — End: 2021-05-27

## 2020-05-19 MED ORDER — HYDROCODONE-ACETAMINOPHEN 5-325 MG PO TABS: 1.0000 | ORAL_TABLET | Freq: Four times a day (QID) | ORAL | 0 refills | Status: DC | PRN

## 2020-05-19 MED ORDER — FAMOTIDINE 20 MG PO TABS: 20.0000 mg | ORAL_TABLET | Freq: Two times a day (BID) | ORAL | 1 refills | Status: DC

## 2020-05-19 MED ORDER — PRAMIPEXOLE DIHYDROCHLORIDE 0.125 MG PO TABS
0.1250 mg | ORAL_TABLET | Freq: Every day | ORAL | 1 refills | Status: DC
Start: 2020-05-19 — End: 2020-11-19

## 2020-05-19 MED ORDER — ALENDRONATE SODIUM 70 MG PO TABS
70.0000 mg | ORAL_TABLET | ORAL | 3 refills | Status: DC
Start: 2020-05-21 — End: 2021-04-15

## 2020-05-19 MED ORDER — PANTOPRAZOLE SODIUM 20 MG PO TBEC: 20.0000 mg | DELAYED_RELEASE_TABLET | Freq: Every day | ORAL | 1 refills | Status: DC

## 2020-05-19 MED ORDER — EZETIMIBE 10 MG PO TABS
10.0000 mg | ORAL_TABLET | Freq: Every day | ORAL | 3 refills | Status: DC
Start: 2020-05-19 — End: 2021-04-06

## 2020-05-19 MED ORDER — BUDESONIDE-FORMOTEROL FUMARATE 80-4.5 MCG/ACT IN AERO
2.0000 | INHALATION_SPRAY | Freq: Two times a day (BID) | RESPIRATORY_TRACT | 4 refills | Status: DC
Start: 2020-05-19 — End: 2021-06-24

## 2020-05-19 MED ORDER — LEVOTHYROXINE SODIUM 100 MCG PO TABS: 100.0000 ug | ORAL_TABLET | Freq: Every day | ORAL | 1 refills | Status: DC

## 2020-05-19 NOTE — Progress Notes (Signed)
Assessment & Plan:  1-3. Arthritis/Chronic midline thoracic back pain/Controlled substance agreement signed Well controlled on current regimen.  Drug screen completed with lab work today.  Patient is unable to leave a urine specimen as she has end-stage renal disease and is on dialysis.  PDMP reviewed with no concerning findings.  Controlled substance agreement updated today. - HYDROcodone-acetaminophen (NORCO/VICODIN) 5-325 MG tablet; Take 1 tablet by mouth every 6 (six) hours as needed for moderate pain.  Dispense: 30 tablet; Refill: 0 - HYDROcodone-acetaminophen (NORCO/VICODIN) 5-325 MG tablet; Take 1 tablet by mouth every 6 (six) hours as needed for moderate pain.  Dispense: 30 tablet; Refill: 0 - HYDROcodone-acetaminophen (NORCO/VICODIN) 5-325 MG tablet; Take 1 tablet by mouth every 6 (six) hours as needed for moderate pain.  Dispense: 30 tablet; Refill: 0 - CMP14+EGFR - Drug Screen 10 W/Conf, Serum  4. Gastroesophageal reflux disease without esophagitis Well controlled on current regimen.  - famotidine (PEPCID) 20 MG tablet; Take 1 tablet (20 mg total) by mouth 2 (two) times daily.  Dispense: 180 tablet; Refill: 1 - pantoprazole (PROTONIX) 20 MG tablet; Take 1 tablet (20 mg total) by mouth daily.  Dispense: 90 tablet; Refill: 1 - CMP14+EGFR  5. Primary hypertension Well controlled on current regimen.  - CMP14+EGFR - Lipid panel  6. Dyslipidemia Well controlled on current regimen.  - ezetimibe (ZETIA) 10 MG tablet; Take 1 tablet (10 mg total) by mouth daily.  Dispense: 90 tablet; Refill: 3 - CMP14+EGFR - Lipid panel  7. Hypothyroidism, unspecified type Labs today to assess as her last TSH in the hospital was abnormal. - levothyroxine (SYNTHROID) 100 MCG tablet; Take 1 tablet (100 mcg total) by mouth daily.  Dispense: 90 tablet; Refill: 1 - CMP14+EGFR - Thyroid Panel With TSH  8. Restless leg syndrome Well controlled on current regimen.  - pramipexole (MIRAPEX) 0.125 MG  tablet; Take 1 tablet (0.125 mg total) by mouth at bedtime.  Dispense: 90 tablet; Refill: 1 - CMP14+EGFR  9. Age-related osteoporosis without current pathological fracture Well controlled on current regimen.  - alendronate (FOSAMAX) 70 MG tablet; Take 1 tablet (70 mg total) by mouth every Thursday.  Dispense: 12 tablet; Refill: 3 - CMP14+EGFR  10. Chronic obstructive pulmonary disease, unspecified COPD type ([place]) Well controlled on current regimen.  - albuterol (VENTOLIN HFA) 108 (90 Base) MCG/ACT inhaler; Inhale 2 puffs into the lungs every 6 (six) hours as needed for wheezing or shortness of breath.  Dispense: 18 g; Refill: 2 - budesonide-formoterol (SYMBICORT) 80-4.5 MCG/ACT inhaler; Inhale 2 puffs into the lungs 2 (two) times daily.  Dispense: 3 each; Refill: 4  11. Vitamin D deficiency Labs to assess. - VITAMIN D 25 Hydroxy (Vit-D Deficiency, Fractures)  12. Anemia in chronic kidney disease, on chronic dialysis ([place]) Labs to assess. - Anemia Profile B  13. ESRD (end stage renal disease) on dialysis ([place]) Continue dialysis 3 times weekly.  Managed by nephrology. - CMP14+EGFR  14. PAF (paroxysmal atrial fibrillation) ([place]) Keep follow-up with cardiology later this month.  Patient is not anticoagulated due to history of GI bleeding and high risk for falls. - CT Head Wo Contrast; Future  15. Anticoagulant not tolerated Patient is not anticoagulated due to history of GI bleeding and high risk for falls. - CT Head Wo Contrast; Future  16. Confusion - Urinalysis, Complete; Future - Urine Culture; Future  17. Right sided weakness - CT Head Wo Contrast; Future  18. Slurred speech - CT Head Wo Contrast; Future   Return in about  3 months (around 08/16/2020) for follow-up of chronic medication conditions.  [name], MSN, APRN, FNP-C [facility]  Subjective:    Patient ID: [name], female    DOB: [birth year], 80 y.o.   MRN: [id number]  Patient  Care Team: [name], FNP as PCP - General (Family Medicine) Minus Breeding, MD as Consulting Physician (Cardiology) Tanda Rockers, MD as Consulting Physician (Pulmonary Disease) Jamal Maes, MD as Consulting Physician (Nephrology)   Chief Complaint:  Chief Complaint  Patient presents with  . Medical Management of Chronic Issues    Check up of chronic medical conditions   . Altered Mental Status    X 1 day    HPI: [name] is a 80 y.o. female presenting on 05/19/2020 for Medical Management of Chronic Issues (Check up of chronic medical conditions/) and Altered Mental Status (X 1 day)  Pain assessment: Cause of pain- arthritis, multiple recent fractures Pain location- back, knees, hips Pain on scale of 1-10- 6/10 Frequency- daily What increases pain- activity What makes pain better- medication Effects on ADL- unable to complete ADLs without assistance Any change in general medical condition- recent fractures of left hip and left femur  Current opioids rx-patient has been out of her Norco for several months.  Previously her prescription was for Norco 5/325 PO every 6 hours as needed # meds rx- 30 Effectiveness of current meds- effective Adverse reactions from pain meds- none Morphine equivalent- 5 MME/day  Pill count performed-No Last drug screen - 11/09/2018 ( high risk q24m moderate risk q69mlow risk yearly ) Drug screen today- Yes Was the [place]- Yes  If yes were their any concerning findings? - No  Overdose risk: 200  Opioid Risk  09/04/2019  Alcohol 0  Illegal Drugs 0  Rx Drugs 0  Alcohol 0  Illegal Drugs 0  Rx Drugs 0  Age between 16-45 years  0  History of Preadolescent Sexual Abuse 0  Psychological Disease 0  Depression 0  Opioid Risk Tool Scoring 0  Opioid Risk Interpretation Low Risk   Pain contract signed on: re-signed today.   New complaints: Patient's daughter reports she has been seeing people that are not there.  This  started yesterday.  Patient's daughter also reports the patient has been dragging her right leg and having slurred speech for weeks.  She reports numbness in her right pointer middle and ring fingers.  Patient reports she does sometimes cough with liquids; this has been going on for months.  Social history:  Relevant past medical, surgical, family and social history reviewed and updated as indicated. Interim medical history since our last visit reviewed.  Allergies and medications reviewed and updated.  DATA REVIEWED: CHART IN EPIC  ROS: Negative unless specifically indicated above in HPI.    Current Outpatient Medications:  .  acetaminophen (TYLENOL) 500 MG tablet, Take 2 tablets (1,000 mg total) by mouth every 8 (eight) hours as needed for mild pain or headache., Disp: 30 tablet, Rfl: 0 .  albuterol (VENTOLIN HFA) 108 (90 Base) MCG/ACT inhaler, Inhale 2 puffs into the lungs every 6 (six) hours as needed for wheezing or shortness of breath., Disp: 18 g, Rfl: 2 .  alendronate (FOSAMAX) 70 MG tablet, Take 1 tablet (70 mg total) by mouth every Thursday., Disp: 12 tablet, Rfl: 3 .  aspirin EC 81 MG tablet, Take 81 mg by mouth daily., Disp: , Rfl:  .  budesonide-formoterol (SYMBICORT) 80-4.5 MCG/ACT inhaler, Inhale 2 puffs  into the lungs 2 (two) times daily., Disp: 3 Inhaler, Rfl: 4 .  cinacalcet (SENSIPAR) 30 MG tablet, Take 1 tablet (30 mg total) by mouth every Monday, Wednesday, and Friday., Disp: 60 tablet, Rfl: 0 .  cyclobenzaprine (FLEXERIL) 10 MG tablet, Take 1 tablet by mouth in the morning, at noon, and at bedtime., Disp: , Rfl:  .  docusate calcium (SURFAK) 240 MG capsule, Take 1 capsule (240 mg total) by mouth daily., Disp: 90 capsule, Rfl: 1 .  doxercalciferol (HECTOROL) 4 MCG/2ML injection, Inject 0.5 mLs (1 mcg total) into the vein every Monday, Wednesday, and Friday with hemodialysis., Disp: 2 mL, Rfl: 0 .  ezetimibe (ZETIA) 10 MG tablet, Take 1 tablet (10 mg total) by mouth  daily., Disp: 90 tablet, Rfl: 3 .  famotidine (PEPCID) 20 MG tablet, TAKE 1 TABLET BY MOUTH TWICE A DAY, Disp: 180 tablet, Rfl: 1 .  HYDROcodone-acetaminophen (NORCO/VICODIN) 5-325 MG tablet, Take 1 tablet by mouth every 6 (six) hours as needed for moderate pain., Disp: 30 tablet, Rfl: 0 .  HYDROcodone-acetaminophen (NORCO/VICODIN) 5-325 MG tablet, Take 1 tablet by mouth every 6 (six) hours as needed for moderate pain., Disp: 30 tablet, Rfl: 0 .  HYDROcodone-acetaminophen (NORCO/VICODIN) 5-325 MG tablet, Take 1 tablet by mouth every 6 (six) hours as needed for moderate pain., Disp: 30 tablet, Rfl: 0 .  levothyroxine (SYNTHROID) 100 MCG tablet, Take 1 tablet (100 mcg total) by mouth daily., Disp: 90 tablet, Rfl: 3 .  Methoxy PEG-Epoetin Beta (MIRCERA IJ), Mircera, Disp: , Rfl:  .  Multiple Vitamin (MULTIVITAMIN) tablet, Take 1 tablet by mouth daily., Disp: , Rfl:  .  multivitamin (RENA-VIT) TABS tablet, Take 1 tablet by mouth daily., Disp: , Rfl:  .  nystatin cream (MYCOSTATIN), Apply 1 application topically 2 (two) times daily as needed for dry skin., Disp: 30 g, Rfl: 0 .  OXYGEN, Inhale 3 L into the lungs continuous., Disp: , Rfl:  .  pantoprazole (PROTONIX) 20 MG tablet, Take 1 tablet (20 mg total) by mouth daily., Disp: 90 tablet, Rfl: 1 .  pramipexole (MIRAPEX) 0.125 MG tablet, Take 1 tablet (0.125 mg total) by mouth at bedtime., Disp: 90 tablet, Rfl: 0 .  Vitamin D, Ergocalciferol, (DRISDOL) 1.25 MG (50000 UNIT) CAPS capsule, Take 1 capsule (50,000 Units total) by mouth every Monday., Disp: 12 capsule, Rfl: 3   Allergies  Allergen Reactions  . Amoxicillin Rash    Has patient had a PCN reaction causing immediate rash, facial/tongue/throat swelling, SOB or lightheadedness with hypotension:YES Has patient had a PCN reaction causing severe rash involving mucus membranes or skin necrosis: Yes Has patient had a PCN reaction that required hospitalization No Has patient had a PCN reaction occurring  within the last 10 years: Yes If all of the above answers are "NO", then may proceed with Cephalosporin use.   . Elemental Sulfur Hives  . Sulfur Hives  . Peg 3350-Electrolytes Other (See Comments)  . Sulfa Drugs Cross Reactors Hives and Itching  . Percocet [Oxycodone-Acetaminophen] Itching and Rash    Did not happen last time she took it  . Sulfa Antibiotics Hives   Past Medical History:  Diagnosis Date  . Acute respiratory failure ([place]) 05/2017  . Anemia   . Anxiety   . Arthritis   . COPD (chronic obstructive pulmonary disease) ([place])   . Depression   . ESRD (end stage renal disease) ([place])    dialysis MWF  . GERD (gastroesophageal reflux disease)   . Gout   .  History of blood transfusion   . History of diabetes mellitus    "diet controlled"  . HLD (hyperlipidemia)   . HOH (hard of hearing)    left ear  . Hypertension    hypotensive -since starting dialysis  . Hypothyroidism   . MRSA (methicillin resistant staph aureus) culture positive 06/01/2017  . PAF (paroxysmal atrial fibrillation) ([place])    a. Echo 11/16:  Mild LVH, EF 55-60%, normal wall motion, MAC, mild MR, severe LAE (49 ml/m2), mild RVE, normal RVSF, mild RAE, mild TR, PASP 24 mmHg;  CHADS2-VASc: 4 >> Coumadin followed by PCP    Past Surgical History:  Procedure Laterality Date  . AV FISTULA PLACEMENT  04/03/2012   Procedure: ARTERIOVENOUS (AV) FISTULA CREATION;  Surgeon: Elam Dutch, MD;  Location: [place];  Service: Vascular;  Laterality: Left;  creation left brachial cephalic fistula   . BIOPSY  08/21/2018   Procedure: BIOPSY;  Surgeon: Thornton Park, MD;  Location: [place];  Service: Gastroenterology;;  . CARPAL TUNNEL RELEASE Bilateral   . COLONOSCOPY W/ POLYPECTOMY    . COLONOSCOPY WITH PROPOFOL N/A 08/21/2018   Procedure: COLONOSCOPY WITH PROPOFOL;  Surgeon: Thornton Park, MD;  Location: [place];  Service: Gastroenterology;  Laterality: N/A;  . DILATION AND CURETTAGE OF UTERUS    .  ESOPHAGOGASTRODUODENOSCOPY (EGD) WITH PROPOFOL N/A 08/21/2018   Procedure: ESOPHAGOGASTRODUODENOSCOPY (EGD) WITH PROPOFOL;  Surgeon: Thornton Park, MD;  Location: [place];  Service: Gastroenterology;  Laterality: N/A;  . EYE SURGERY Bilateral    bilateral cataract removal  . HEMATOMA EVACUATION Left 05/14/2018   Procedure: Incision and Drainage of Left Arm Hematoma;  Surgeon: Elam Dutch, MD;  Location: [facility];  Service: Vascular;  Laterality: Left;  . Hemodialysis  catheter Right   . IR FLUORO GUIDE CV LINE RIGHT  05/26/2017  . IR REMOVAL TUN CV CATH W/O FL  05/24/2017  . IR US GUIDE VASC ACCESS RIGHT  05/26/2017  . LIGATION OF ARTERIOVENOUS  FISTULA Left 02/04/2015   Procedure: LIGATION OF BRACHIOCEPHALIC ARTERIOVENOUS  FISTULA;  Surgeon: Conrad Sentinel, MD;  Location: [place];  Service: Vascular;  Laterality: Left;  Marland Kitchen MULTIPLE TOOTH EXTRACTIONS    . PARTIAL HIP ARTHROPLASTY    . POLYPECTOMY  08/21/2018   Procedure: POLYPECTOMY;  Surgeon: Thornton Park, MD;  Location: [place];  Service: Gastroenterology;;  . PORTACATH PLACEMENT    . REVERSE SHOULDER ARTHROPLASTY Left 01/22/2016   Procedure: LEFT REVERSE SHOULDER ARTHROPLASTY;  Surgeon: Netta Cedars, MD;  Location: [place];  Service: Orthopedics;  Laterality: Left;  . SHOULDER ARTHROSCOPY Bilateral   . SPLIT NIGHT STUDY  07/26/2015  . STERIOD INJECTION Right 01/22/2016   Procedure: RIGHT RING FINGER STEROID INJECTION;  Surgeon: Netta Cedars, MD;  Location: [place];  Service: Orthopedics;  Laterality: Right;  . TEE WITHOUT CARDIOVERSION N/A 05/26/2017   Procedure: TRANSESOPHAGEAL ECHOCARDIOGRAM (TEE);  Surgeon: Lelon Perla, MD;  Location: [facility];  Service: Cardiovascular;  Laterality: N/A;  . THROMBECTOMY BRACHIAL ARTERY Left 02/06/2015   Procedure: EVACUATION OF LEFT ARM HEMATOMA;  Surgeon: Angelia Mould, MD;  Location: [place];  Service: Vascular;  Laterality: Left;  . TOTAL KNEE ARTHROPLASTY Bilateral   . TUBAL  LIGATION      Social History   Socioeconomic History  . Marital status: Widowed    Spouse name: Not on file  . Number of children: 6  . Years of education: 27  . Highest education level: 10th grade  Occupational History  .  Occupation: RETIRED  Tobacco Use  . Smoking status: Former Smoker    Packs/day: 0.25    Years: 2.00    Pack years: 0.50    Types: Cigarettes    Quit date: 04/04/1998    Years since quitting: 22.1  . Smokeless tobacco: Never Used  Vaping Use  . Vaping Use: Never used  Substance and Sexual Activity  . Alcohol use: No    Alcohol/week: 0.0 standard drinks  . Drug use: No  . Sexual activity: Not Currently    Birth control/protection: None  Other Topics Concern  . Not on file  Social History Narrative   ** Merged History Encounter **       Widowed 6 children Lives with son and daughter in law homemaker   Social Determinants of Health   Financial Resource Strain: Not on file  Food Insecurity: Not on file  Transportation Needs: Not on file  Physical Activity: Not on file  Stress: Not on file  Social Connections: Not on file  Intimate Partner Violence: Not on file        Objective:    BP 110/72   Pulse 94   Temp 98.8 F (37.1 C) (Temporal)   Ht 5' 5" (1.651 m)   Wt 144 lb (65.3 kg)   SpO2 96%   BMI 23.96 kg/m   Wt Readings from Last 3 Encounters:  05/19/20 144 lb (65.3 kg)  03/10/20 154 lb (69.9 kg)  09/04/19 158 lb 6.4 oz (71.8 kg)    Physical Exam Vitals reviewed.  Constitutional:      General: She is not in acute distress.    Appearance: Normal appearance. She is normal weight. She is not ill-appearing, toxic-appearing or diaphoretic.  HENT:     Head: Normocephalic and atraumatic.  Eyes:     General: No scleral icterus.       Right eye: No discharge.        Left eye: No discharge.     Conjunctiva/sclera: Conjunctivae normal.  Cardiovascular:     Rate and Rhythm: Normal rate. Rhythm irregular.     Heart sounds: Normal heart  sounds. No murmur heard. No friction rub. No gallop.   Pulmonary:     Effort: Pulmonary effort is normal. No respiratory distress.     Breath sounds: Normal breath sounds. No stridor. No wheezing, rhonchi or rales.  Musculoskeletal:        General: Normal range of motion.     Cervical back: Normal range of motion.     Left knee: Deformity present.  Skin:    General: Skin is warm and dry.     Capillary Refill: Capillary refill takes less than 2 seconds.  Neurological:     General: No focal deficit present.     Mental Status: She is alert and oriented to person, place, and time. Mental status is at baseline.     Gait: Gait abnormal (riding in [facility]).  Psychiatric:        Mood and Affect: Mood normal.        Behavior: Behavior normal.        Thought Content: Thought content normal.        Judgment: Judgment normal.     Lab Results  Component Value Date   TSH 3.590 09/06/2019   Lab Results  Component Value Date   WBC 12.8 (H) 12/11/2019   HGB 9.5 (L) 12/11/2019   HCT 31.4 (L) 12/11/2019   MCV 100.6 (H) 12/11/2019   PLT 396  12/11/2019   Lab Results  Component Value Date   NA 139 12/11/2019   K 4.8 12/11/2019   CO2 26 12/11/2019   GLUCOSE 102 (H) 12/11/2019   BUN 54 (H) 12/11/2019   CREATININE 6.36 (H) 12/11/2019   BILITOT 0.7 12/11/2019   ALKPHOS 115 12/11/2019   AST 19 12/11/2019   ALT 17 12/11/2019   PROT 6.1 (L) 12/11/2019   ALBUMIN 2.8 (L) 12/11/2019   CALCIUM 8.2 (L) 12/11/2019   ANIONGAP 16 (H) 12/11/2019   Lab Results  Component Value Date   CHOL 182 09/06/2019   Lab Results  Component Value Date   HDL 78 09/06/2019   Lab Results  Component Value Date   LDLCALC 91 09/06/2019   Lab Results  Component Value Date   TRIG 69 09/06/2019   Lab Results  Component Value Date   CHOLHDL 2.3 09/06/2019   Lab Results  Component Value Date   HGBA1C 4.5 (L) 05/25/2017

## 2020-05-20 ENCOUNTER — Other Ambulatory Visit: Payer: Medicare Other

## 2020-05-20 ENCOUNTER — Ambulatory Visit (HOSPITAL_COMMUNITY)
Admission: RE | Admit: 2020-05-20 | Discharge: 2020-05-20 | Disposition: A | Discharge status: Home / Self Care | Payer: Medicare Other | Source: Ambulatory Visit | Attending: Family Medicine | Admitting: Family Medicine

## 2020-05-20 ENCOUNTER — Encounter (HOSPITAL_COMMUNITY): Payer: Self-pay

## 2020-05-20 DIAGNOSIS — R531 Weakness: Secondary | ICD-10-CM | POA: Insufficient documentation

## 2020-05-20 DIAGNOSIS — D631 Anemia in chronic kidney disease: Secondary | ICD-10-CM | POA: Diagnosis not present

## 2020-05-20 DIAGNOSIS — D509 Iron deficiency anemia, unspecified: Secondary | ICD-10-CM | POA: Diagnosis not present

## 2020-05-20 DIAGNOSIS — R410 Disorientation, unspecified: Secondary | ICD-10-CM

## 2020-05-20 DIAGNOSIS — I480 Paroxysmal atrial fibrillation: Secondary | ICD-10-CM | POA: Insufficient documentation

## 2020-05-20 DIAGNOSIS — Z992 Dependence on renal dialysis: Secondary | ICD-10-CM | POA: Diagnosis not present

## 2020-05-20 DIAGNOSIS — R519 Headache, unspecified: Secondary | ICD-10-CM | POA: Diagnosis not present

## 2020-05-20 DIAGNOSIS — Z789 Other specified health status: Secondary | ICD-10-CM | POA: Diagnosis not present

## 2020-05-20 DIAGNOSIS — D689 Coagulation defect, unspecified: Secondary | ICD-10-CM | POA: Diagnosis not present

## 2020-05-20 DIAGNOSIS — Z4901 Encounter for fitting and adjustment of extracorporeal dialysis catheter: Secondary | ICD-10-CM | POA: Diagnosis not present

## 2020-05-20 DIAGNOSIS — R4781 Slurred speech: Secondary | ICD-10-CM | POA: Diagnosis not present

## 2020-05-20 DIAGNOSIS — Z23 Encounter for immunization: Secondary | ICD-10-CM | POA: Diagnosis not present

## 2020-05-20 DIAGNOSIS — N2581 Secondary hyperparathyroidism of renal origin: Secondary | ICD-10-CM | POA: Diagnosis not present

## 2020-05-20 DIAGNOSIS — N186 End stage renal disease: Secondary | ICD-10-CM | POA: Diagnosis not present

## 2020-05-20 LAB — URINALYSIS, COMPLETE
Bilirubin, UA: NEGATIVE
Glucose, UA: NEGATIVE
Ketones, UA: NEGATIVE
Nitrite, UA: NEGATIVE
Specific Gravity, UA: 1.025 (ref 1.005–1.030)
Urobilinogen, Ur: 0.2 mg/dL (ref 0.2–1.0)
pH, UA: 8.5 — ABNORMAL HIGH (ref 5.0–7.5)

## 2020-05-20 LAB — MICROSCOPIC EXAMINATION: WBC, UA: >30 /hpf — AB (ref 0–5)

## 2020-05-21 ENCOUNTER — Telehealth: Payer: Self-pay

## 2020-05-21 ENCOUNTER — Other Ambulatory Visit: Payer: Self-pay | Admitting: Family Medicine

## 2020-05-21 ENCOUNTER — Telehealth: Payer: Self-pay | Admitting: Family Medicine

## 2020-05-21 DIAGNOSIS — E039 Hypothyroidism, unspecified: Secondary | ICD-10-CM

## 2020-05-21 DIAGNOSIS — N3001 Acute cystitis with hematuria: Secondary | ICD-10-CM

## 2020-05-21 DIAGNOSIS — R531 Weakness: Secondary | ICD-10-CM

## 2020-05-21 MED ORDER — LEVOTHYROXINE SODIUM 112 MCG PO TABS: 112.0000 ug | ORAL_TABLET | Freq: Every day | ORAL | 2 refills | Status: DC

## 2020-05-21 MED ORDER — CIPROFLOXACIN HCL 500 MG PO TABS: 500.0000 mg | ORAL_TABLET | Freq: Every day | ORAL | 0 refills | Status: AC

## 2020-05-21 NOTE — Telephone Encounter (Signed)
Please review and advise.

## 2020-05-21 NOTE — Telephone Encounter (Signed)
Spoke to [place] - patient agrees to try PT

## 2020-05-21 NOTE — Telephone Encounter (Signed)
Addressed in lab results

## 2020-05-21 NOTE — Telephone Encounter (Signed)
I received a call back from [place].  Dr. Ennis Forts is fine with patient participating in physical therapy for her right leg.  Please let patient know and ask if she is agreeable to a referral back to physical therapy.

## 2020-05-22 DIAGNOSIS — N186 End stage renal disease: Secondary | ICD-10-CM | POA: Diagnosis not present

## 2020-05-22 DIAGNOSIS — Z23 Encounter for immunization: Secondary | ICD-10-CM | POA: Diagnosis not present

## 2020-05-22 DIAGNOSIS — N2581 Secondary hyperparathyroidism of renal origin: Secondary | ICD-10-CM | POA: Diagnosis not present

## 2020-05-22 DIAGNOSIS — Z992 Dependence on renal dialysis: Secondary | ICD-10-CM | POA: Diagnosis not present

## 2020-05-22 DIAGNOSIS — D689 Coagulation defect, unspecified: Secondary | ICD-10-CM | POA: Diagnosis not present

## 2020-05-22 DIAGNOSIS — Z4901 Encounter for fitting and adjustment of extracorporeal dialysis catheter: Secondary | ICD-10-CM | POA: Diagnosis not present

## 2020-05-22 DIAGNOSIS — D631 Anemia in chronic kidney disease: Secondary | ICD-10-CM | POA: Diagnosis not present

## 2020-05-22 DIAGNOSIS — R519 Headache, unspecified: Secondary | ICD-10-CM | POA: Diagnosis not present

## 2020-05-22 DIAGNOSIS — D509 Iron deficiency anemia, unspecified: Secondary | ICD-10-CM | POA: Diagnosis not present

## 2020-05-24 NOTE — Addendum Note (Signed)
Addended by: [name] on: 05/24/2020 05:34 PM   Modules accepted: Orders

## 2020-05-25 DIAGNOSIS — D689 Coagulation defect, unspecified: Secondary | ICD-10-CM | POA: Diagnosis not present

## 2020-05-25 DIAGNOSIS — N186 End stage renal disease: Secondary | ICD-10-CM | POA: Diagnosis not present

## 2020-05-25 DIAGNOSIS — D631 Anemia in chronic kidney disease: Secondary | ICD-10-CM | POA: Diagnosis not present

## 2020-05-25 DIAGNOSIS — Z4901 Encounter for fitting and adjustment of extracorporeal dialysis catheter: Secondary | ICD-10-CM | POA: Diagnosis not present

## 2020-05-25 DIAGNOSIS — N2581 Secondary hyperparathyroidism of renal origin: Secondary | ICD-10-CM | POA: Diagnosis not present

## 2020-05-25 DIAGNOSIS — Z992 Dependence on renal dialysis: Secondary | ICD-10-CM | POA: Diagnosis not present

## 2020-05-25 DIAGNOSIS — Z23 Encounter for immunization: Secondary | ICD-10-CM | POA: Diagnosis not present

## 2020-05-25 DIAGNOSIS — D509 Iron deficiency anemia, unspecified: Secondary | ICD-10-CM | POA: Diagnosis not present

## 2020-05-25 DIAGNOSIS — R519 Headache, unspecified: Secondary | ICD-10-CM | POA: Diagnosis not present

## 2020-05-26 LAB — CMP14+EGFR
ALT: 11 IU/L (ref 0–32)
AST: 21 IU/L (ref 0–40)
Albumin/Globulin Ratio: 1.3 (ref 1.2–2.2)
Albumin: 3.4 g/dL — ABNORMAL LOW (ref 3.7–4.7)
Alkaline Phosphatase: 120 IU/L (ref 44–121)
BUN/Creatinine Ratio: 6 — ABNORMAL LOW (ref 12–28)
BUN: 31 mg/dL — ABNORMAL HIGH (ref 8–27)
Bilirubin Total: <0.2 mg/dL (ref 0.0–1.2)
CO2: 19 mmol/L — ABNORMAL LOW (ref 20–29)
Calcium: 8.6 mg/dL — ABNORMAL LOW (ref 8.7–10.3)
Chloride: 99 mmol/L (ref 96–106)
Creatinine, Ser: 5.2 mg/dL (ref 0.57–1.00)
GFR calc Af Amer: 8 mL/min/{1.73_m2} — ABNORMAL LOW (ref >59)
GFR calc non Af Amer: 7 mL/min/{1.73_m2} — ABNORMAL LOW (ref >59)
Globulin, Total: 2.6 g/dL (ref 1.5–4.5)
Glucose: 111 mg/dL — ABNORMAL HIGH (ref 65–99)
Potassium: 4.5 mmol/L (ref 3.5–5.2)
Sodium: 145 mmol/L — ABNORMAL HIGH (ref 134–144)
Total Protein: 6 g/dL (ref 6.0–8.5)

## 2020-05-26 LAB — DRUG SCREEN 10 W/CONF, SERUM
Amphetamines, IA: NEGATIVE ng/mL
Barbiturates, IA: NEGATIVE ug/mL
Benzodiazepines, IA: NEGATIVE ng/mL
Cocaine & Metabolite, IA: NEGATIVE ng/mL
Methadone, IA: NEGATIVE ng/mL
Opiates, IA: NEGATIVE ng/mL
Oxycodones, IA: NEGATIVE ng/mL
Phencyclidine, IA: NEGATIVE ng/mL
Propoxyphene, IA: NEGATIVE ng/mL
THC(Marijuana) Metabolite, IA: NEGATIVE ng/mL

## 2020-05-26 LAB — ANEMIA PROFILE B
Basophils Absolute: 0.1 10*3/uL (ref 0.0–0.2)
Basos: 1 %
EOS (ABSOLUTE): 0.1 10*3/uL (ref 0.0–0.4)
Eos: 1 %
Ferritin: 2388 ng/mL — ABNORMAL HIGH (ref 15–150)
Folate: >20 ng/mL (ref >3.0)
Hematocrit: 31.6 % — ABNORMAL LOW (ref 34.0–46.6)
Hemoglobin: 10.4 g/dL — ABNORMAL LOW (ref 11.1–15.9)
Immature Grans (Abs): 0.1 10*3/uL (ref 0.0–0.1)
Immature Granulocytes: 1 %
Iron Saturation: 25 % (ref 15–55)
Iron: 31 ug/dL (ref 27–139)
Lymphocytes Absolute: 1 10*3/uL (ref 0.7–3.1)
Lymphs: 10 %
MCH: 30.7 pg (ref 26.6–33.0)
MCHC: 32.9 g/dL (ref 31.5–35.7)
MCV: 93 fL (ref 79–97)
Monocytes Absolute: 1.1 10*3/uL — ABNORMAL HIGH (ref 0.1–0.9)
Monocytes: 10 %
Neutrophils Absolute: 8.4 10*3/uL — ABNORMAL HIGH (ref 1.4–7.0)
Neutrophils: 77 %
Platelets: 499 10*3/uL — ABNORMAL HIGH (ref 150–450)
RBC: 3.39 x10E6/uL — ABNORMAL LOW (ref 3.77–5.28)
RDW: 14.4 % (ref 11.7–15.4)
Retic Ct Pct: 2.1 % (ref 0.6–2.6)
Total Iron Binding Capacity: 123 ug/dL — CL (ref 250–450)
UIBC: 92 ug/dL — ABNORMAL LOW (ref 118–369)
Vitamin B-12: 1206 pg/mL (ref 232–1245)
WBC: 10.9 10*3/uL — ABNORMAL HIGH (ref 3.4–10.8)

## 2020-05-26 LAB — LIPID PANEL
Chol/HDL Ratio: 1.9 ratio (ref 0.0–4.4)
Cholesterol, Total: 149 mg/dL (ref 100–199)
HDL: 77 mg/dL (ref >39)
LDL Chol Calc (NIH): 59 mg/dL (ref 0–99)
Triglycerides: 66 mg/dL (ref 0–149)
VLDL Cholesterol Cal: 13 mg/dL (ref 5–40)

## 2020-05-26 LAB — THYROID PANEL WITH TSH
Free Thyroxine Index: 1.9 (ref 1.2–4.9)
T3 Uptake Ratio: 34 % (ref 24–39)
T4, Total: 5.5 ug/dL (ref 4.5–12.0)
TSH: 7.7 u[IU]/mL — ABNORMAL HIGH (ref 0.450–4.500)

## 2020-05-26 LAB — VITAMIN D 25 HYDROXY (VIT D DEFICIENCY, FRACTURES): Vit D, 25-Hydroxy: 100 ng/mL (ref 30.0–100.0)

## 2020-05-27 ENCOUNTER — Other Ambulatory Visit: Payer: Self-pay | Admitting: Family Medicine

## 2020-05-27 DIAGNOSIS — Z992 Dependence on renal dialysis: Secondary | ICD-10-CM | POA: Diagnosis not present

## 2020-05-27 DIAGNOSIS — Z4901 Encounter for fitting and adjustment of extracorporeal dialysis catheter: Secondary | ICD-10-CM | POA: Diagnosis not present

## 2020-05-27 DIAGNOSIS — N185 Chronic kidney disease, stage 5: Secondary | ICD-10-CM | POA: Diagnosis not present

## 2020-05-27 DIAGNOSIS — N186 End stage renal disease: Secondary | ICD-10-CM | POA: Diagnosis not present

## 2020-05-27 DIAGNOSIS — Z23 Encounter for immunization: Secondary | ICD-10-CM | POA: Diagnosis not present

## 2020-05-27 DIAGNOSIS — R519 Headache, unspecified: Secondary | ICD-10-CM | POA: Diagnosis not present

## 2020-05-27 DIAGNOSIS — I480 Paroxysmal atrial fibrillation: Secondary | ICD-10-CM | POA: Diagnosis not present

## 2020-05-27 DIAGNOSIS — D509 Iron deficiency anemia, unspecified: Secondary | ICD-10-CM | POA: Diagnosis not present

## 2020-05-27 DIAGNOSIS — M1990 Unspecified osteoarthritis, unspecified site: Secondary | ICD-10-CM | POA: Diagnosis not present

## 2020-05-27 DIAGNOSIS — N2581 Secondary hyperparathyroidism of renal origin: Secondary | ICD-10-CM | POA: Diagnosis not present

## 2020-05-27 DIAGNOSIS — J9621 Acute and chronic respiratory failure with hypoxia: Secondary | ICD-10-CM | POA: Diagnosis not present

## 2020-05-27 DIAGNOSIS — D689 Coagulation defect, unspecified: Secondary | ICD-10-CM | POA: Diagnosis not present

## 2020-05-27 DIAGNOSIS — D631 Anemia in chronic kidney disease: Secondary | ICD-10-CM | POA: Diagnosis not present

## 2020-05-27 DIAGNOSIS — N3001 Acute cystitis with hematuria: Secondary | ICD-10-CM

## 2020-05-27 LAB — URINE CULTURE

## 2020-05-27 MED ORDER — LINEZOLID 600 MG PO TABS: 600.0000 mg | ORAL_TABLET | Freq: Every day | ORAL | 0 refills | Status: AC

## 2020-05-28 NOTE — Progress Notes (Signed)
Cardiology Office Note   Date:  05/29/2020   ID:  [name], DOB Aug 27, 1940, MRN [id number]  PCP:  [name], FNP  Cardiologist: Dr. Percival Spanish No chief complaint on file.    History of Present Illness: [name] is a 80 y.o. female who presents for ongoing assessment and management of paroxysmal atrial fibrillation, with history of hypertension and hyperlipidemia.  The patient also has a history of end-stage renal disease and is on dialysis.  She is also oxygen dependent.  She has some frailty and gets around slowly using a walker but can go to the grocery store.  Was last seen by Dr. Percival Spanish on 08/07/2019 and was without any complaints concerning chest pressure neck or arm discomfort or shortness of breath.  She was not considered for anticoagulation given her frailty.  She comes today with her daughter without any significant complaints.  She is in a wheelchair today.  She is being treated for VRE/UTI and is being followed by PCP and urology.  She denies any cardiac symptoms at this time.  She has been medically compliant.  Her daughter who is with her make sure she gets her medications, and is well cared for by her daughter.  Past Medical History:  Diagnosis Date  . Acute respiratory failure ([place]) 05/2017  . Anemia   . Anxiety   . Arthritis   . COPD (chronic obstructive pulmonary disease) ([place])   . Depression   . ESRD (end stage renal disease) ([place])    dialysis MWF  . GERD (gastroesophageal reflux disease)   . Gout   . History of blood transfusion   . History of diabetes mellitus    "diet controlled"  . HLD (hyperlipidemia)   . HOH (hard of hearing)    left ear  . Hypertension    hypotensive -since starting dialysis  . Hypothyroidism   . MRSA (methicillin resistant staph aureus) culture positive 06/01/2017  . PAF (paroxysmal atrial fibrillation) ([place])    a. Echo 11/16:  Mild LVH, EF 55-60%, normal wall motion, MAC, mild MR, severe LAE (49 ml/m2), mild RVE, normal  RVSF, mild RAE, mild TR, PASP 24 mmHg;  CHADS2-VASc: 4 >> Coumadin followed by PCP    Past Surgical History:  Procedure Laterality Date  . AV FISTULA PLACEMENT  04/03/2012   Procedure: ARTERIOVENOUS (AV) FISTULA CREATION;  Surgeon: Elam Dutch, MD;  Location: [place];  Service: Vascular;  Laterality: Left;  creation left brachial cephalic fistula   . BIOPSY  08/21/2018   Procedure: BIOPSY;  Surgeon: Thornton Park, MD;  Location: [place];  Service: Gastroenterology;;  . CARPAL TUNNEL RELEASE Bilateral   . COLONOSCOPY W/ POLYPECTOMY    . COLONOSCOPY WITH PROPOFOL N/A 08/21/2018   Procedure: COLONOSCOPY WITH PROPOFOL;  Surgeon: Thornton Park, MD;  Location: [place];  Service: Gastroenterology;  Laterality: N/A;  . DILATION AND CURETTAGE OF UTERUS    . ESOPHAGOGASTRODUODENOSCOPY (EGD) WITH PROPOFOL N/A 08/21/2018   Procedure: ESOPHAGOGASTRODUODENOSCOPY (EGD) WITH PROPOFOL;  Surgeon: Thornton Park, MD;  Location: [place];  Service: Gastroenterology;  Laterality: N/A;  . EYE SURGERY Bilateral    bilateral cataract removal  . HEMATOMA EVACUATION Left 05/14/2018   Procedure: Incision and Drainage of Left Arm Hematoma;  Surgeon: Elam Dutch, MD;  Location: [facility] OR;  Service: Vascular;  Laterality: Left;  . Hemodialysis  catheter Right   . IR FLUORO GUIDE CV LINE RIGHT  05/26/2017  . IR REMOVAL TUN CV CATH W/O FL  05/24/2017  .  IR US GUIDE VASC ACCESS RIGHT  05/26/2017  . LIGATION OF ARTERIOVENOUS  FISTULA Left 02/04/2015   Procedure: LIGATION OF BRACHIOCEPHALIC ARTERIOVENOUS  FISTULA;  Surgeon: Conrad [place], MD;  Location: [place];  Service: Vascular;  Laterality: Left;  Marland Kitchen MULTIPLE TOOTH EXTRACTIONS    . PARTIAL HIP ARTHROPLASTY    . POLYPECTOMY  08/21/2018   Procedure: POLYPECTOMY;  Surgeon: Thornton Park, MD;  Location: [place];  Service: Gastroenterology;;  . PORTACATH PLACEMENT    . REVERSE SHOULDER ARTHROPLASTY Left 01/22/2016   Procedure: LEFT REVERSE  SHOULDER ARTHROPLASTY;  Surgeon: Netta Cedars, MD;  Location: [place];  Service: Orthopedics;  Laterality: Left;  . SHOULDER ARTHROSCOPY Bilateral   . SPLIT NIGHT STUDY  07/26/2015  . STERIOD INJECTION Right 01/22/2016   Procedure: RIGHT RING FINGER STEROID INJECTION;  Surgeon: Netta Cedars, MD;  Location: [place];  Service: Orthopedics;  Laterality: Right;  . TEE WITHOUT CARDIOVERSION N/A 05/26/2017   Procedure: TRANSESOPHAGEAL ECHOCARDIOGRAM (TEE);  Surgeon: Lelon Perla, MD;  Location: [place];  Service: Cardiovascular;  Laterality: N/A;  . THROMBECTOMY BRACHIAL ARTERY Left 02/06/2015   Procedure: EVACUATION OF LEFT ARM HEMATOMA;  Surgeon: Angelia Mould, MD;  Location: [place];  Service: Vascular;  Laterality: Left;  . TOTAL KNEE ARTHROPLASTY Bilateral   . TUBAL LIGATION       Current Outpatient Medications  Medication Sig Dispense Refill  . acetaminophen (TYLENOL) 500 MG tablet Take 2 tablets (1,000 mg total) by mouth every 8 (eight) hours as needed for mild pain or headache. 30 tablet 0  . albuterol (VENTOLIN HFA) 108 (90 Base) MCG/ACT inhaler Inhale 2 puffs into the lungs every 6 (six) hours as needed for wheezing or shortness of breath. 18 g 2  . alendronate (FOSAMAX) 70 MG tablet Take 1 tablet (70 mg total) by mouth every Thursday. 12 tablet 3  . aspirin EC 81 MG tablet Take 81 mg by mouth daily.    . budesonide-formoterol (SYMBICORT) 80-4.5 MCG/ACT inhaler Inhale 2 puffs into the lungs 2 (two) times daily. 3 each 4  . cinacalcet (SENSIPAR) 30 MG tablet Take 1 tablet (30 mg total) by mouth every Monday, Wednesday, and Friday. 60 tablet 0  . cyclobenzaprine (FLEXERIL) 10 MG tablet Take 1 tablet by mouth in the morning, at noon, and at bedtime.    . docusate calcium (SURFAK) 240 MG capsule Take 1 capsule (240 mg total) by mouth daily. 90 capsule 1  . doxercalciferol (HECTOROL) 4 MCG/2ML injection Inject 0.5 mLs (1 mcg total) into the vein every Monday, Wednesday, and Friday  with hemodialysis. 2 mL 0  . ezetimibe (ZETIA) 10 MG tablet Take 1 tablet (10 mg total) by mouth daily. 90 tablet 3  . famotidine (PEPCID) 20 MG tablet Take 1 tablet (20 mg total) by mouth 2 (two) times daily. 180 tablet 1  . HYDROcodone-acetaminophen (NORCO/VICODIN) 5-325 MG tablet Take 1 tablet by mouth every 6 (six) hours as needed for moderate pain. 30 tablet 0  . levothyroxine (SYNTHROID) 112 MCG tablet Take 1 tablet (112 mcg total) by mouth daily. 30 tablet 2  . linezolid (ZYVOX) 600 MG tablet Take 1 tablet (600 mg total) by mouth daily for 7 days. Take after dialysis on dialysis days. 7 tablet 0  . Methoxy PEG-Epoetin Beta (MIRCERA IJ) Mircera    . Multiple Vitamin (MULTIVITAMIN) tablet Take 1 tablet by mouth daily.    . multivitamin (RENA-VIT) TABS tablet Take 1 tablet by mouth daily.    Marland Kitchen nystatin  cream (MYCOSTATIN) Apply 1 application topically 2 (two) times daily as needed for dry skin. 30 g 0  . OXYGEN Inhale 3 L into the lungs continuous.    . pantoprazole (PROTONIX) 20 MG tablet Take 1 tablet (20 mg total) by mouth daily. 90 tablet 1  . pramipexole (MIRAPEX) 0.125 MG tablet Take 1 tablet (0.125 mg total) by mouth at bedtime. 90 tablet 1  . Vitamin D, Ergocalciferol, (DRISDOL) 1.25 MG (50000 UNIT) CAPS capsule Take 1 capsule (50,000 Units total) by mouth every Monday. 12 capsule 3   No current facility-administered medications for this visit.    Allergies:   Amoxicillin, Elemental sulfur, Sulfur, Peg 3350-electrolytes, Sulfa drugs cross reactors, Percocet [oxycodone-acetaminophen], and Sulfa antibiotics    Social History:  The patient  reports that she quit smoking about 22 years ago. Her smoking use included cigarettes. She has a 0.50 pack-year smoking history. She has never used smokeless tobacco. She reports that she does not drink alcohol and does not use drugs.   Family History:  The patient's family history includes Arthritis in her mother; Cancer in her brother and sister;  Dementia in her sister; Diabetes in her daughter, daughter, father, and sister; Heart disease in her father; Hepatitis C in her son; Hyperlipidemia in her daughter, daughter, and son; Hypertension in her daughter, daughter, and son; Other in her sister; Stroke in her brother.    ROS: All other systems are reviewed and negative. Unless otherwise mentioned in H&P    PHYSICAL EXAM: VS:  BP (!) 150/86   Pulse 94   Ht _0  (1.651 m)   Wt 144 lb (65.3 kg)   BMI 23.96 kg/m  , BMI Body mass index is 23.96 kg/m. GEN: Well nourished, well developed, in no acute distress, frail sitting in a wheelchair HEENT: normal Neck: no JVD, carotid bruits, or masses Cardiac: Distant heart sounds, RRR; no murmurs, rubs, or gallops,no edema  Respiratory:  Clear to auscultation bilaterally, normal work of breathing.  She is not on any oxygen. GI: soft, nontender, nondistended, + BS MS: no deformity or atrophy wearing a brace to her right knee and tib-fib area. Skin: warm and dry, no rash Neuro:  Strength and sensation are intact Psych: euthymic mood, full affect   EKG: Atrial fibrillation with PVCs.  Heart rate of 94 bpm.  (Personally reviewed)  Recent Labs: 05/19/2020: ALT 11; BUN 31; Creatinine, Ser 5.20; Hemoglobin 10.4; Platelets 499; Potassium 4.5; Sodium 145; TSH 7.700    Lipid Panel    Component Value Date/Time   CHOL 149 05/19/2020 1502   TRIG 66 05/19/2020 1502   HDL 77 05/19/2020 1502   CHOLHDL 1.9 05/19/2020 1502   CHOLHDL 3.3 05/25/2017 0421   VLDL 18 05/25/2017 0421   LDLCALC 59 05/19/2020 1502      Wt Readings from Last 3 Encounters:  05/29/20 144 lb (65.3 kg)  05/19/20 144 lb (65.3 kg)  03/10/20 154 lb (69.9 kg)      Other studies Reviewed: Echo 04/29/2018 1. The left ventricle appears to be normal in size, has mildly increased  wall thickness, with 60-65%. Echo evidence of pseudonormal in diastolic  filling patterns.  2. Right ventricular systolic pressure is moderately  elevated.  3. The right ventricle is normal in size, has normal wall thickness and  normal systolic function.  4. Moderately dilated left atrial size.  5. Mildly dilated right atrial size.  6. Aortic valve normal.  7. No atrial level shunt detected by color flow  Doppler.  8. There is mild calcification and thickening of the mitral valve.  9. There is mild thickening and calcifications of the aortic valve.  10. Mitral valve regurgitation is mild by color flow Doppler.  11. Tricuspid regurgitation is mild.  12. The mitral valve is degenerative.  13. Moderate mitral annular calcification.    ASSESSMENT AND PLAN:  1.  Atrial fibrillation: On initial vital signs rate was not well controlled but on examination heart rate was in the 70s apically.  Due to frailty she is not on any anticoagulation therapy but is taking aspirin 81 mg daily.  She is not on rate control medications.  We will see her again in the medicine office in 6 months unless she becomes symptomatic.  No changes in her regimen at this time.  2.  Hypertension: I have retaken her blood pressure in the office/exam room.  138/68.  No changes in her regimen at this time.  3.  End-stage renal disease: Dialysis 3 times a week followed by urology.   Current medicines are reviewed at length with the patient today.  I have spent 25 min's dedicated to the care of this patient on the date of this encounter to include pre-visit review of records, assessment, management and diagnostic testing,with shared decision making.  Labs/ tests ordered today include: None Phill Myron. West Pugh, ANP, AACC   05/29/2020 3:08 PM    [facility] [address] Office [phone] Fax [phone]  Notice: This dictation was prepared with Dragon dictation along with smaller phrase technology. Any transcriptional errors that result from this process are unintentional and may not be corrected upon review.

## 2020-05-29 ENCOUNTER — Ambulatory Visit (INDEPENDENT_AMBULATORY_CARE_PROVIDER_SITE_OTHER): Payer: Medicare Other | Admitting: Adult Health

## 2020-05-29 ENCOUNTER — Encounter: Payer: Self-pay | Admitting: Adult Health

## 2020-05-29 ENCOUNTER — Other Ambulatory Visit: Payer: Self-pay

## 2020-05-29 VITALS — BP 150/86 | HR 94 | Ht 65.0 in | Wt 144.0 lb

## 2020-05-29 DIAGNOSIS — I10 Essential (primary) hypertension: Secondary | ICD-10-CM

## 2020-05-29 DIAGNOSIS — D689 Coagulation defect, unspecified: Secondary | ICD-10-CM | POA: Diagnosis not present

## 2020-05-29 DIAGNOSIS — N186 End stage renal disease: Secondary | ICD-10-CM

## 2020-05-29 DIAGNOSIS — I4821 Permanent atrial fibrillation: Secondary | ICD-10-CM

## 2020-05-29 DIAGNOSIS — Z23 Encounter for immunization: Secondary | ICD-10-CM | POA: Diagnosis not present

## 2020-05-29 DIAGNOSIS — D631 Anemia in chronic kidney disease: Secondary | ICD-10-CM | POA: Diagnosis not present

## 2020-05-29 DIAGNOSIS — Z992 Dependence on renal dialysis: Secondary | ICD-10-CM | POA: Diagnosis not present

## 2020-05-29 DIAGNOSIS — Z4901 Encounter for fitting and adjustment of extracorporeal dialysis catheter: Secondary | ICD-10-CM | POA: Diagnosis not present

## 2020-05-29 DIAGNOSIS — N2581 Secondary hyperparathyroidism of renal origin: Secondary | ICD-10-CM | POA: Diagnosis not present

## 2020-05-29 DIAGNOSIS — D509 Iron deficiency anemia, unspecified: Secondary | ICD-10-CM | POA: Diagnosis not present

## 2020-05-29 DIAGNOSIS — R519 Headache, unspecified: Secondary | ICD-10-CM | POA: Diagnosis not present

## 2020-05-29 NOTE — Patient Instructions (Signed)
Medication Instructions:  Continue current medication  *If you need a refill on your cardiac medications before your next appointment, please call your pharmacy*   Lab Work: None Ordered   Testing/Procedures: None ordered   Follow-Up: At [facility], you and your health needs are our priority.  As part of our continuing mission to provide you with exceptional heart care, we have created designated Provider Care Teams.  These Care Teams include your primary Cardiologist (physician) and Advanced Practice Providers (APPs -  Physician Assistants and Nurse Practitioners) who all work together to provide you with the care you need, when you need it.  We recommend signing up for the patient portal called "MyChart".  Sign up information is provided on this After Visit Summary.  MyChart is used to connect with patients for Virtual Visits (Telemedicine).  Patients are able to view lab/test results, encounter notes, upcoming appointments, etc.  Non-urgent messages can be sent to your provider as well.   To learn more about what you can do with MyChart, go to [email or web address].    Your next appointment:   6 month(s)  The format for your next appointment:   In Person  Provider:   You may see Minus Breeding, MD or one of the following Advanced Practice Providers on your designated Care Team:    Rosaria Ferries, PA-C  Jory Sims, DNP, ANP

## 2020-05-30 DIAGNOSIS — I480 Paroxysmal atrial fibrillation: Secondary | ICD-10-CM | POA: Diagnosis not present

## 2020-06-01 DIAGNOSIS — D689 Coagulation defect, unspecified: Secondary | ICD-10-CM | POA: Diagnosis not present

## 2020-06-01 DIAGNOSIS — Z992 Dependence on renal dialysis: Secondary | ICD-10-CM | POA: Diagnosis not present

## 2020-06-01 DIAGNOSIS — N186 End stage renal disease: Secondary | ICD-10-CM | POA: Diagnosis not present

## 2020-06-01 DIAGNOSIS — E1122 Type 2 diabetes mellitus with diabetic chronic kidney disease: Secondary | ICD-10-CM | POA: Diagnosis not present

## 2020-06-01 DIAGNOSIS — R519 Headache, unspecified: Secondary | ICD-10-CM | POA: Diagnosis not present

## 2020-06-01 DIAGNOSIS — Z23 Encounter for immunization: Secondary | ICD-10-CM | POA: Diagnosis not present

## 2020-06-01 DIAGNOSIS — N2581 Secondary hyperparathyroidism of renal origin: Secondary | ICD-10-CM | POA: Diagnosis not present

## 2020-06-01 DIAGNOSIS — D509 Iron deficiency anemia, unspecified: Secondary | ICD-10-CM | POA: Diagnosis not present

## 2020-06-01 DIAGNOSIS — Z4901 Encounter for fitting and adjustment of extracorporeal dialysis catheter: Secondary | ICD-10-CM | POA: Diagnosis not present

## 2020-06-01 DIAGNOSIS — D631 Anemia in chronic kidney disease: Secondary | ICD-10-CM | POA: Diagnosis not present

## 2020-06-02 ENCOUNTER — Ambulatory Visit: Payer: Medicare Other | Attending: Family Medicine | Admitting: Physical Therapy

## 2020-06-02 ENCOUNTER — Other Ambulatory Visit: Payer: Self-pay

## 2020-06-02 ENCOUNTER — Encounter: Payer: Self-pay | Admitting: Physical Therapy

## 2020-06-02 DIAGNOSIS — R2681 Unsteadiness on feet: Secondary | ICD-10-CM | POA: Insufficient documentation

## 2020-06-02 DIAGNOSIS — R262 Difficulty in walking, not elsewhere classified: Secondary | ICD-10-CM | POA: Insufficient documentation

## 2020-06-02 DIAGNOSIS — M6281 Muscle weakness (generalized): Secondary | ICD-10-CM | POA: Diagnosis not present

## 2020-06-02 NOTE — Therapy (Signed)
[facility] [place], [place], [address] Phone: [phone]   Fax:  [phone]  Physical Therapy Evaluation  Patient Details  Name: [name] MRN: [id number] Date of Birth: 1940-12-17 Referring Provider (PT): [name], FNP   Encounter Date: 06/02/2020   PT End of Session - 06/02/20 2013    Visit Number 1    Number of Visits 8    Date for PT Re-Evaluation 07/07/20    Authorization Type [employer] Medicare ([place] modifier); Progress note every 10th visit    PT Start Time 1030    PT Stop Time 1113    PT Time Calculation (min) 43 min    Equipment Utilized During Treatment Gait belt;Other (comment)   Transfer chair, rolling walker   Activity Tolerance Patient tolerated treatment well;Patient limited by pain    Behavior During Therapy [facility] for tasks assessed/performed           Past Medical History:  Diagnosis Date  . Acute respiratory failure ([place]) 05/2017  . Anemia   . Anxiety   . Arthritis   . COPD (chronic obstructive pulmonary disease) ([place])   . Depression   . ESRD (end stage renal disease) ([place])    dialysis MWF  . GERD (gastroesophageal reflux disease)   . Gout   . History of blood transfusion   . History of diabetes mellitus    "diet controlled"  . HLD (hyperlipidemia)   . HOH (hard of hearing)    left ear  . Hypertension    hypotensive -since starting dialysis  . Hypothyroidism   . MRSA (methicillin resistant staph aureus) culture positive 06/01/2017  . PAF (paroxysmal atrial fibrillation) ([place])    a. Echo 11/16:  Mild LVH, EF 55-60%, normal wall motion, MAC, mild MR, severe LAE (49 ml/m2), mild RVE, normal RVSF, mild RAE, mild TR, PASP 24 mmHg;  CHADS2-VASc: 4 >> Coumadin followed by PCP    Past Surgical History:  Procedure Laterality Date  . AV FISTULA PLACEMENT  04/03/2012   Procedure: ARTERIOVENOUS (AV) FISTULA CREATION;  Surgeon: Elam Dutch, MD;  Location: [place];   Service: Vascular;  Laterality: Left;  creation left brachial cephalic fistula   . BIOPSY  08/21/2018   Procedure: BIOPSY;  Surgeon: Thornton Park, MD;  Location: [place];  Service: Gastroenterology;;  . CARPAL TUNNEL RELEASE Bilateral   . COLONOSCOPY W/ POLYPECTOMY    . COLONOSCOPY WITH PROPOFOL N/A 08/21/2018   Procedure: COLONOSCOPY WITH PROPOFOL;  Surgeon: Thornton Park, MD;  Location: [place];  Service: Gastroenterology;  Laterality: N/A;  . DILATION AND CURETTAGE OF UTERUS    . ESOPHAGOGASTRODUODENOSCOPY (EGD) WITH PROPOFOL N/A 08/21/2018   Procedure: ESOPHAGOGASTRODUODENOSCOPY (EGD) WITH PROPOFOL;  Surgeon: Thornton Park, MD;  Location: [place];  Service: Gastroenterology;  Laterality: N/A;  . EYE SURGERY Bilateral    bilateral cataract removal  . HEMATOMA EVACUATION Left 05/14/2018   Procedure: Incision and Drainage of Left Arm Hematoma;  Surgeon: Elam Dutch, MD;  Location: [facility] OR;  Service: Vascular;  Laterality: Left;  . Hemodialysis  catheter Right   . IR FLUORO GUIDE CV LINE RIGHT  05/26/2017  . IR REMOVAL TUN CV CATH W/O FL  05/24/2017  . IR US GUIDE VASC ACCESS RIGHT  05/26/2017  . LIGATION OF ARTERIOVENOUS  FISTULA Left 02/04/2015   Procedure: LIGATION OF BRACHIOCEPHALIC ARTERIOVENOUS  FISTULA;  Surgeon: Conrad Hemby Bridge, MD;  Location: [place];  Service: Vascular;  Laterality: Left;  .  MULTIPLE TOOTH EXTRACTIONS    . PARTIAL HIP ARTHROPLASTY    . POLYPECTOMY  08/21/2018   Procedure: POLYPECTOMY;  Surgeon: Thornton Park, MD;  Location: [place];  Service: Gastroenterology;;  . PORTACATH PLACEMENT    . REVERSE SHOULDER ARTHROPLASTY Left 01/22/2016   Procedure: LEFT REVERSE SHOULDER ARTHROPLASTY;  Surgeon: Netta Cedars, MD;  Location: [place];  Service: Orthopedics;  Laterality: Left;  . SHOULDER ARTHROSCOPY Bilateral   . SPLIT NIGHT STUDY  07/26/2015  . STERIOD INJECTION Right 01/22/2016   Procedure: RIGHT RING FINGER STEROID INJECTION;  Surgeon:  Netta Cedars, MD;  Location: [place];  Service: Orthopedics;  Laterality: Right;  . TEE WITHOUT CARDIOVERSION N/A 05/26/2017   Procedure: TRANSESOPHAGEAL ECHOCARDIOGRAM (TEE);  Surgeon: Lelon Perla, MD;  Location: [place];  Service: Cardiovascular;  Laterality: N/A;  . THROMBECTOMY BRACHIAL ARTERY Left 02/06/2015   Procedure: EVACUATION OF LEFT ARM HEMATOMA;  Surgeon: Angelia Mould, MD;  Location: [place];  Service: Vascular;  Laterality: Left;  . TOTAL KNEE ARTHROPLASTY Bilateral   . TUBAL LIGATION      There were no vitals filed for this visit.    Subjective Assessment - 06/02/20 2004    Subjective COVID-19 screening performed upon arrival. Patient arrives to physical therapy with reports of right LE weakness and difficulty walking that has progressed since her left hip revision in 2021. Patient has difficulties with ambulating around the home and mainly sits throughout the day. Patient reports doing some exercises but inconsistent with performing. Patient's daughter in law supervises for ambulation with walker and ADLs as needed. Patient is only able to stand to 2-3 minutes before she reports weakness and left LE pain. Patient reports utilizing a ramp to enter her home and utilizes a WC for community negotiation. Patient denies significant pain in R LE with more pain in left LE. Patient's daughter-in-law reports avulsion fracture to left knee therefore requested not to perform PT on left LE. Patient's goals are to improve movement, improve strength, and improve ability to perform ADLs and walk.    Patient is accompained by: Family member   Daughter in Sports coach, [place]   Pertinent History L posterior hemi-arthroplasty 02/19/2020, A-Fib, COPD, End stage renal disease, history of left reverse TSA, bilateral TKA, Hypothyroidism, bilateral ankle braces, OP (patient reports) Dialysis treatments, OA, HOH.    Limitations Sitting;Standing;Walking;House hold activities    How long can you stand  comfortably? 2-3 minutes before L LE pain    How long can you walk comfortably? Very short distances with rolling walker    Patient Stated Goals move around safely and comfortably    Currently in Pain? No/denies   not right LE             [facility] PT Assessment - 06/02/20 0001      Assessment   Medical Diagnosis Right sided weakness    Referring Provider (PT) [name], FNP    Onset Date/Surgical Date --   progression since 02/19/2020 L hip revision   Next MD Visit 08/18/2020    Prior Therapy yes      Precautions   Precautions Fall    Precaution Comments No left LE strengthening per patient's daughter-in-law      Restrictions   Weight Bearing Restrictions No      Balance Screen   Has the patient fallen in the past 6 months No    Has the patient had a decrease in activity level because of a fear of falling?  Yes  Is the patient reluctant to leave their home because of a fear of falling?  Yes      [place] residence    Living Arrangements Children      Strength   Overall Strength Comments assessed in sitting    Right Hip Flexion 3+/5    Right Hip ABduction 3+/5    Right Hip ADduction 3+/5    Right Knee Flexion 4-/5    Right Knee Extension 4-/5      Palpation   Palpation comment denies right LE tenderness upon palpation      Transfers   Transfers Sit to Stand    Sit to Stand 3: Mod assist;With armrests;Multiple attempts      Ambulation/Gait   Ambulation/Gait Yes    Ambulation/Gait Assistance 3: Mod assist   x2; assist at patient and assist at RW with WC follow   Ambulation Distance (Feet) 4 Feet    Assistive device Rolling walker    Gait Pattern Step-to pattern;Decreased stride length;Decreased stance time - left;Decreased step length - right;Decreased weight shift to left;Antalgic;Trunk flexed;Wide base of support;Poor foot clearance - right    Pre-Gait Activities standing at walker; alternating stepping forward 2 minutes  max standing tolerance    Gait Comments WC negotiation primary mode                      Objective measurements completed on examination: See above findings.               PT Education - 06/02/20 2011    Education Details seated R hip abduction, R marching, HR/TR    Person(s) Educated Patient;Caregiver(s)    Methods Explanation;Demonstration;Handout    Comprehension Verbalized understanding;Returned demonstration               PT Long Term Goals - 06/02/20 2019      PT LONG TERM GOAL #1   Title Patient will be independent with HEP and its progression.    Time 4    Period Weeks    Status New      PT LONG TERM GOAL #2   Title Patient will ambulate 10 feet with with rolling walker and step to step gait pattern with distant supervision to safely access areas of home.    Baseline 4 feet with min/mod assist and WC follow    Time 4    Period Weeks    Status New      PT LONG TERM GOAL #3   Title Patient will perform sit to stand transfers from [facility] to rolling walker with distant supervision and proper hand placement for safe transfers from various surfaces.    Time 4    Period Weeks    Status New      PT LONG TERM GOAL #4   Title Patient will demonstrate 4/5 or greater RIGHT LE MMT to improve stability during functional tasks.    Time 4    Period Weeks    Status New                  Plan - 06/02/20 2014    Clinical Impression Statement Patient is an 80 year old female who presents to physical therapy with her daughter in law with decreased right LE strength, difficulty walking, and decreased balance that has progressed since L hip revision in 2021. Patient requires min-mod A for sit to stand transfers. Patient required min/mod A x2 for ambulation 4 feet  with WC follow. Patient very limited with left LE WB which in turn causes a very limited right step length during gait. Patient and PT discussed plan of care and discussed HEP and the importance  of performing to maximize PT benefit. Patient reported understanding. Patient would benefit from skilled physical therapy to address deficits and address patient's goals.    Personal Factors and Comorbidities Comorbidity 1;Comorbidity 2    Comorbidities L posterior hemi-arthroplasty 02/19/2020, A-Fib, COPD, End stage renal disease, history of left reverse TSA, bilateral TKA Hypothyroidism, bilateral ankle braces, OP (patient reports), DM, Dialysis treatments, OA, HOH.    Examination-Activity Limitations Locomotion Level;Transfers;Stand;Bed Mobility    Stability/Clinical Decision Making Evolving/Moderate complexity    Clinical Decision Making Moderate    Rehab Potential Fair    PT Frequency 2x / week    PT Duration 4 weeks    PT Treatment/Interventions ADLs/Self Care Home Management;Functional mobility training;Stair training;Gait training;Therapeutic activities;Therapeutic exercise;Neuromuscular re-education;Balance training;Manual techniques;Passive range of motion;Patient/family education    PT Next Visit Plan RIGHT LOWER EXTREMITY STRENGTHENING ONLY. Nustep, seated strengthening and core stability, increase standing tolerance, balance trainging and gait training to tolerance.    PT Home Exercise Plan see patient education section    Consulted and Agree with Plan of Care Patient;Family member/caregiver    Family Member [employer], Daughter-in-law           Patient will benefit from skilled therapeutic intervention in order to improve the following deficits and impairments:  Pain,Abnormal gait,Decreased activity tolerance,Decreased strength,Decreased balance,Difficulty walking,Decreased range of motion  Visit Diagnosis: Muscle weakness (generalized)  Difficulty in walking, not elsewhere classified  Unsteadiness on feet     Problem List Patient Active Problem List   Diagnosis Date Noted  . Controlled substance agreement signed 05/16/2019  . Arthritis 02/14/2019  . Chronic  midline thoracic back pain 02/14/2019  . Restless leg syndrome 10/26/2018  . Vitamin D deficiency 10/26/2018  . Age-related osteoporosis without current pathological fracture 10/26/2018  . Hypertension   . Gout   . COPD (chronic obstructive pulmonary disease) ([place])   . Anxiety   . 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 Adenomatous polyp of descending colon   . Adenomatous polyp of transverse colon   . Depression 08/19/2018  . Pernicious anemia 12/12/2017  . Moderate protein-calorie malnutrition ([place]) 06/02/2017  . Gastroesophageal reflux disease without esophagitis   . Dyslipidemia 02/09/2016  . Do not resuscitate 03/31/2015  . PAF (paroxysmal atrial fibrillation) ([place]) 02/04/2015  . Hypothyroidism   . Anemia in chronic kidney disease 12/09/2014  . Steal syndrome dialysis vascular access ([place]) 12/03/2014  . Secondary hyperparathyroidism of renal origin ([place]) 11/17/2014  . Physical deconditioning 08/04/2013  . Chronic kidney disease due to type 2 diabetes mellitus ([place]) 07/26/2013  . ESRD (end stage renal disease) on dialysis [facility]) 03/15/2012    Gabriela Eves, PT, DPT 06/02/2020, 8:33 PM  [facility] [address] [place], [place], [address] Phone: [phone]   Fax:  [phone]  Name: [name] MRN: [id number] Date of Birth: 11-06-1940

## 2020-06-03 DIAGNOSIS — D509 Iron deficiency anemia, unspecified: Secondary | ICD-10-CM | POA: Diagnosis not present

## 2020-06-03 DIAGNOSIS — N186 End stage renal disease: Secondary | ICD-10-CM | POA: Diagnosis not present

## 2020-06-03 DIAGNOSIS — D689 Coagulation defect, unspecified: Secondary | ICD-10-CM | POA: Diagnosis not present

## 2020-06-03 DIAGNOSIS — Z4901 Encounter for fitting and adjustment of extracorporeal dialysis catheter: Secondary | ICD-10-CM | POA: Diagnosis not present

## 2020-06-03 DIAGNOSIS — N2581 Secondary hyperparathyroidism of renal origin: Secondary | ICD-10-CM | POA: Diagnosis not present

## 2020-06-03 DIAGNOSIS — Z992 Dependence on renal dialysis: Secondary | ICD-10-CM | POA: Diagnosis not present

## 2020-06-04 ENCOUNTER — Other Ambulatory Visit: Payer: Self-pay

## 2020-06-04 ENCOUNTER — Ambulatory Visit: Payer: Medicare Other | Admitting: Physical Therapy

## 2020-06-04 ENCOUNTER — Encounter: Payer: Self-pay | Admitting: Physical Therapy

## 2020-06-04 DIAGNOSIS — M6281 Muscle weakness (generalized): Secondary | ICD-10-CM

## 2020-06-04 DIAGNOSIS — R2681 Unsteadiness on feet: Secondary | ICD-10-CM | POA: Diagnosis not present

## 2020-06-04 DIAGNOSIS — R262 Difficulty in walking, not elsewhere classified: Secondary | ICD-10-CM | POA: Diagnosis not present

## 2020-06-04 NOTE — Therapy (Signed)
[facility] [place], [place], [address] Phone: [phone]   Fax:  [phone]  Physical Therapy Treatment  Patient Details  Name: [name] MRN: [id number] Date of Birth: 1940/07/21 Referring Provider (PT): [name], FNP   Encounter Date: 06/04/2020   PT End of Session - 06/04/20 1535    Visit Number 2    Number of Visits 8    Date for PT Re-Evaluation 07/07/20    Authorization Type [employer] Medicare ([place] modifier); Progress note every 10th visit    PT Start Time 1302    PT Stop Time 1345    PT Time Calculation (min) 43 min    Equipment Utilized During Treatment Gait belt;Other (comment)   WC   Activity Tolerance Patient tolerated treatment well;Patient limited by pain    Behavior During Therapy [facility] for tasks assessed/performed           Past Medical History:  Diagnosis Date  . Acute respiratory failure ([place]) 05/2017  . Anemia   . Anxiety   . Arthritis   . COPD (chronic obstructive pulmonary disease) ([place])   . Depression   . ESRD (end stage renal disease) ([place])    dialysis MWF  . GERD (gastroesophageal reflux disease)   . Gout   . History of blood transfusion   . History of diabetes mellitus    "diet controlled"  . HLD (hyperlipidemia)   . HOH (hard of hearing)    left ear  . Hypertension    hypotensive -since starting dialysis  . Hypothyroidism   . MRSA (methicillin resistant staph aureus) culture positive 06/01/2017  . PAF (paroxysmal atrial fibrillation) ([place])    a. Echo 11/16:  Mild LVH, EF 55-60%, normal wall motion, MAC, mild MR, severe LAE (49 ml/m2), mild RVE, normal RVSF, mild RAE, mild TR, PASP 24 mmHg;  CHADS2-VASc: 4 >> Coumadin followed by PCP    Past Surgical History:  Procedure Laterality Date  . AV FISTULA PLACEMENT  04/03/2012   Procedure: ARTERIOVENOUS (AV) FISTULA CREATION;  Surgeon: Elam Dutch, MD;  Location: [place];  Service: Vascular;  Laterality:  Left;  creation left brachial cephalic fistula   . BIOPSY  08/21/2018   Procedure: BIOPSY;  Surgeon: Thornton Park, MD;  Location: [place];  Service: Gastroenterology;;  . CARPAL TUNNEL RELEASE Bilateral   . COLONOSCOPY W/ POLYPECTOMY    . COLONOSCOPY WITH PROPOFOL N/A 08/21/2018   Procedure: COLONOSCOPY WITH PROPOFOL;  Surgeon: Thornton Park, MD;  Location: [place];  Service: Gastroenterology;  Laterality: N/A;  . DILATION AND CURETTAGE OF UTERUS    . ESOPHAGOGASTRODUODENOSCOPY (EGD) WITH PROPOFOL N/A 08/21/2018   Procedure: ESOPHAGOGASTRODUODENOSCOPY (EGD) WITH PROPOFOL;  Surgeon: Thornton Park, MD;  Location: [place];  Service: Gastroenterology;  Laterality: N/A;  . EYE SURGERY Bilateral    bilateral cataract removal  . HEMATOMA EVACUATION Left 05/14/2018   Procedure: Incision and Drainage of Left Arm Hematoma;  Surgeon: Elam Dutch, MD;  Location: [facility] OR;  Service: Vascular;  Laterality: Left;  . Hemodialysis  catheter Right   . IR FLUORO GUIDE CV LINE RIGHT  05/26/2017  . IR REMOVAL TUN CV CATH W/O FL  05/24/2017  . IR US GUIDE VASC ACCESS RIGHT  05/26/2017  . LIGATION OF ARTERIOVENOUS  FISTULA Left 02/04/2015   Procedure: LIGATION OF BRACHIOCEPHALIC ARTERIOVENOUS  FISTULA;  Surgeon: Conrad Nicollet, MD;  Location: [place];  Service: Vascular;  Laterality: Left;  [place] MULTIPLE TOOTH EXTRACTIONS    .  PARTIAL HIP ARTHROPLASTY    . POLYPECTOMY  08/21/2018   Procedure: POLYPECTOMY;  Surgeon: Thornton Park, MD;  Location: [place];  Service: Gastroenterology;;  . PORTACATH PLACEMENT    . REVERSE SHOULDER ARTHROPLASTY Left 01/22/2016   Procedure: LEFT REVERSE SHOULDER ARTHROPLASTY;  Surgeon: Netta Cedars, MD;  Location: [place];  Service: Orthopedics;  Laterality: Left;  . SHOULDER ARTHROSCOPY Bilateral   . SPLIT NIGHT STUDY  07/26/2015  . STERIOD INJECTION Right 01/22/2016   Procedure: RIGHT RING FINGER STEROID INJECTION;  Surgeon: Netta Cedars, MD;  Location: [place];   Service: Orthopedics;  Laterality: Right;  . TEE WITHOUT CARDIOVERSION N/A 05/26/2017   Procedure: TRANSESOPHAGEAL ECHOCARDIOGRAM (TEE);  Surgeon: Lelon Perla, MD;  Location: [place];  Service: Cardiovascular;  Laterality: N/A;  . THROMBECTOMY BRACHIAL ARTERY Left 02/06/2015   Procedure: EVACUATION OF LEFT ARM HEMATOMA;  Surgeon: Angelia Mould, MD;  Location: [place];  Service: Vascular;  Laterality: Left;  . TOTAL KNEE ARTHROPLASTY Bilateral   . TUBAL LIGATION      There were no vitals filed for this visit.   Subjective Assessment - 06/04/20 1313    Subjective COVID-19 screening performed upon arrival. Patient's daughter in law reports doing HEP.    Patient is accompained by: Family member    Pertinent History L posterior hemi-arthroplasty 02/19/2020, A-Fib, COPD, End stage renal disease, history of left reverse TSA, bilateral TKA, Hypothyroidism, bilateral ankle braces, OP (patient reports) Dialysis treatments, OA, HOH.    Limitations Sitting;Standing;Walking;House hold activities    How long can you stand comfortably? 2-3 minutes before L LE pain    How long can you walk comfortably? Very short distances with rolling walker    Patient Stated Goals move around safely and comfortably    Currently in Pain? No/denies              [facility] PT Assessment - 06/04/20 0001      Assessment   Medical Diagnosis Right sided weakness    Referring Provider (PT) [name], FNP    Next MD Visit 08/18/2020    Prior Therapy yes      Precautions   Precautions Fall    Precaution Comments No left LE strengthening per patient's daughter-in-law      Restrictions   Weight Bearing Restrictions No                         [facility] Adult PT Treatment/Exercise - 06/04/20 0001      Transfers   Transfers Sit to Stand    Sit to Stand 3: Mod assist;With armrests;Multiple attempts      Ambulation/Gait   Pre-Gait Activities standing at walker 1 min 30 seconds with contact  guard with weight shifting      Exercises   Exercises Knee/Hip;Ankle      Knee/Hip Exercises: Aerobic   Nustep Level 2 x10 min      Knee/Hip Exercises: Seated   [place] Quad AROM;Right;3 sets;10 reps    Heel Slides AAROM;Right;3 sets;10 reps    Other Seated Knee/Hip Exercises R hip abd isometic 3" x30; L hip  adduction 3" x30    Marching AROM;Right;3 sets;10 reps      Ankle Exercises: Stretches   Other Stretch prostretch x2 minutes                       PT Long Term Goals - 06/02/20 2019      PT LONG  TERM GOAL #1   Title Patient will be independent with HEP and its progression.    Time 4    Period Weeks    Status New      PT LONG TERM GOAL #2   Title Patient will ambulate 10 feet with with rolling walker and step to step gait pattern with distant supervision to safely access areas of home.    Baseline 4 feet with min/mod assist and WC follow    Time 4    Period Weeks    Status New      PT LONG TERM GOAL #3   Title Patient will perform sit to stand transfers from [facility] to rolling walker with distant supervision and proper hand placement for safe transfers from various surfaces.    Time 4    Period Weeks    Status New      PT LONG TERM GOAL #4   Title Patient will demonstrate 4/5 or greater RIGHT LE MMT to improve stability during functional tasks.    Time 4    Period Weeks    Status New                 Plan - 06/04/20 1524    Clinical Impression Statement Patient was able to tolerate treatment fairly well though with more reports of left hip pain than right. Patient guided through TEs with good technique after explanation Rest breaks provided seconday to fatigue. Patient did require mod A for transfers from [facility] to nustep. Patient still shows difficulties with L LE weighbearing to move R LE. Standing tolerance is at 1 minute 30 seconds at this time.    Personal Factors and Comorbidities Comorbidity 1;Comorbidity 2    Comorbidities L posterior  hemi-arthroplasty 02/19/2020, A-Fib, COPD, End stage renal disease, history of left reverse TSA, bilateral TKA Hypothyroidism, bilateral ankle braces, OP (patient reports), DM, Dialysis treatments, OA, HOH.    Examination-Activity Limitations Locomotion Level;Transfers;Stand;Bed Mobility    Stability/Clinical Decision Making Evolving/Moderate complexity    Clinical Decision Making Moderate    Rehab Potential Fair    PT Frequency 2x / week    PT Duration 4 weeks    PT Treatment/Interventions ADLs/Self Care Home Management;Functional mobility training;Stair training;Gait training;Therapeutic activities;Therapeutic exercise;Neuromuscular re-education;Balance training;Manual techniques;Passive range of motion;Patient/family education    PT Next Visit Plan RIGHT LOWER EXTREMITY STRENGTHENING ONLY. Nustep, seated strengthening and core stability, increase standing tolerance, balance trainging and gait training to tolerance.    PT Home Exercise Plan see patient education section    Consulted and Agree with Plan of Care Patient;Family member/caregiver    Family Member [employer], Daughter-in-law           Patient will benefit from skilled therapeutic intervention in order to improve the following deficits and impairments:  Pain,Abnormal gait,Decreased activity tolerance,Decreased strength,Decreased balance,Difficulty walking,Decreased range of motion  Visit Diagnosis: Muscle weakness (generalized)  Difficulty in walking, not elsewhere classified  Unsteadiness on feet     Problem List Patient Active Problem List   Diagnosis Date Noted  . Controlled substance agreement signed 05/16/2019  . Arthritis 02/14/2019  . Chronic midline thoracic back pain 02/14/2019  . Restless leg syndrome 10/26/2018  . Vitamin D deficiency 10/26/2018  . Age-related osteoporosis without current pathological fracture 10/26/2018  . Hypertension   . Gout   . COPD (chronic obstructive pulmonary disease)  ([place])   . Anxiety   . 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 Adenomatous polyp of descending colon   .  Adenomatous polyp of transverse colon   . Depression 08/19/2018  . Pernicious anemia 12/12/2017  . Moderate protein-calorie malnutrition ([place]) 06/02/2017  . Gastroesophageal reflux disease without esophagitis   . Dyslipidemia 02/09/2016  . Do not resuscitate 03/31/2015  . PAF (paroxysmal atrial fibrillation) ([place]) 02/04/2015  . Hypothyroidism   . Anemia in chronic kidney disease 12/09/2014  . Steal syndrome dialysis vascular access ([place]) 12/03/2014  . Secondary hyperparathyroidism of renal origin ([place]) 11/17/2014  . Physical deconditioning 08/04/2013  . Chronic kidney disease due to type 2 diabetes mellitus ([place]) 07/26/2013  . ESRD (end stage renal disease) on dialysis [facility]) 03/15/2012    Gabriela Eves, PT, DPT 06/04/2020, 3:41 PM  [facility] [place], [place], [address] Phone: [phone]   Fax:  [phone]  Name: [name] MRN: [id number] Date of Birth: [birth year]

## 2020-06-05 DIAGNOSIS — N2581 Secondary hyperparathyroidism of renal origin: Secondary | ICD-10-CM | POA: Diagnosis not present

## 2020-06-05 DIAGNOSIS — D689 Coagulation defect, unspecified: Secondary | ICD-10-CM | POA: Diagnosis not present

## 2020-06-05 DIAGNOSIS — Z992 Dependence on renal dialysis: Secondary | ICD-10-CM | POA: Diagnosis not present

## 2020-06-05 DIAGNOSIS — N186 End stage renal disease: Secondary | ICD-10-CM | POA: Diagnosis not present

## 2020-06-05 DIAGNOSIS — Z4901 Encounter for fitting and adjustment of extracorporeal dialysis catheter: Secondary | ICD-10-CM | POA: Diagnosis not present

## 2020-06-05 DIAGNOSIS — D509 Iron deficiency anemia, unspecified: Secondary | ICD-10-CM | POA: Diagnosis not present

## 2020-06-08 DIAGNOSIS — D689 Coagulation defect, unspecified: Secondary | ICD-10-CM | POA: Diagnosis not present

## 2020-06-08 DIAGNOSIS — N186 End stage renal disease: Secondary | ICD-10-CM | POA: Diagnosis not present

## 2020-06-08 DIAGNOSIS — Z4901 Encounter for fitting and adjustment of extracorporeal dialysis catheter: Secondary | ICD-10-CM | POA: Diagnosis not present

## 2020-06-08 DIAGNOSIS — N2581 Secondary hyperparathyroidism of renal origin: Secondary | ICD-10-CM | POA: Diagnosis not present

## 2020-06-08 DIAGNOSIS — Z992 Dependence on renal dialysis: Secondary | ICD-10-CM | POA: Diagnosis not present

## 2020-06-08 DIAGNOSIS — D509 Iron deficiency anemia, unspecified: Secondary | ICD-10-CM | POA: Diagnosis not present

## 2020-06-09 ENCOUNTER — Other Ambulatory Visit: Payer: Self-pay

## 2020-06-09 ENCOUNTER — Ambulatory Visit: Payer: Medicare Other | Admitting: Physical Therapy

## 2020-06-09 DIAGNOSIS — R2681 Unsteadiness on feet: Secondary | ICD-10-CM | POA: Diagnosis not present

## 2020-06-09 DIAGNOSIS — R262 Difficulty in walking, not elsewhere classified: Secondary | ICD-10-CM

## 2020-06-09 DIAGNOSIS — M6281 Muscle weakness (generalized): Secondary | ICD-10-CM | POA: Diagnosis not present

## 2020-06-09 NOTE — Therapy (Signed)
[name], [place], [address] Phone: [phone]   Fax:  [phone]  Physical Therapy Treatment  Patient Details  Name: [name] MRN: [id number] Date of Birth: 1940/08/11 Referring Provider ([name]): [name], FNP   Encounter Date: 06/09/2020   [name] End of Session - 06/09/20 1548    Visit Number 3    Number of Visits 8    Date for [name] Re-Evaluation 07/07/20    Authorization Type [employer] Medicare ([place] modifier); Progress note every 10th visit    [name] Start Time 1515    [name] Stop Time 1557    [name] Time Calculation (min) 42 min    Equipment Utilized During Treatment Gait belt;Other (comment)    Activity Tolerance Patient tolerated treatment well;Patient limited by pain    Behavior During Therapy [facility] for tasks assessed/performed           Past Medical History:  Diagnosis Date  . Acute respiratory failure ([place]) 05/2017  . Anemia   . Anxiety   . Arthritis   . COPD (chronic obstructive pulmonary disease) ([place])   . Depression   . ESRD (end stage renal disease) ([place])    dialysis MWF  . GERD (gastroesophageal reflux disease)   . Gout   . History of blood transfusion   . History of diabetes mellitus    "diet controlled"  . HLD (hyperlipidemia)   . HOH (hard of hearing)    left ear  . Hypertension    hypotensive -since starting dialysis  . Hypothyroidism   . MRSA (methicillin resistant staph aureus) culture positive 06/01/2017  . PAF (paroxysmal atrial fibrillation) ([place])    a. Echo 11/16:  Mild LVH, EF 55-60%, normal wall motion, MAC, mild MR, severe LAE (49 ml/m2), mild RVE, normal RVSF, mild RAE, mild TR, PASP 24 mmHg;  CHADS2-VASc: 4 >> Coumadin followed by PCP    Past Surgical History:  Procedure Laterality Date  . AV FISTULA PLACEMENT  04/03/2012   Procedure: ARTERIOVENOUS (AV) FISTULA CREATION;  Surgeon: Elam Dutch, MD;  Location: [place];  Service: Vascular;  Laterality:  Left;  creation left brachial cephalic fistula   . BIOPSY  08/21/2018   Procedure: BIOPSY;  Surgeon: Thornton Park, MD;  Location: [place];  Service: Gastroenterology;;  . CARPAL TUNNEL RELEASE Bilateral   . COLONOSCOPY W/ POLYPECTOMY    . COLONOSCOPY WITH PROPOFOL N/A 08/21/2018   Procedure: COLONOSCOPY WITH PROPOFOL;  Surgeon: Thornton Park, MD;  Location: [place];  Service: Gastroenterology;  Laterality: N/A;  . DILATION AND CURETTAGE OF UTERUS    . ESOPHAGOGASTRODUODENOSCOPY (EGD) WITH PROPOFOL N/A 08/21/2018   Procedure: ESOPHAGOGASTRODUODENOSCOPY (EGD) WITH PROPOFOL;  Surgeon: Thornton Park, MD;  Location: [place];  Service: Gastroenterology;  Laterality: N/A;  . EYE SURGERY Bilateral    bilateral cataract removal  . HEMATOMA EVACUATION Left 05/14/2018   Procedure: Incision and Drainage of Left Arm Hematoma;  Surgeon: Elam Dutch, MD;  Location: [facility] OR;  Service: Vascular;  Laterality: Left;  . Hemodialysis  catheter Right   . IR FLUORO GUIDE CV LINE RIGHT  05/26/2017  . IR REMOVAL TUN CV CATH W/O FL  05/24/2017  . IR US GUIDE VASC ACCESS RIGHT  05/26/2017  . LIGATION OF ARTERIOVENOUS  FISTULA Left 02/04/2015   Procedure: LIGATION OF BRACHIOCEPHALIC ARTERIOVENOUS  FISTULA;  Surgeon: Conrad Southgate, MD;  Location: [place];  Service: Vascular;  Laterality: Left;  Marland Kitchen MULTIPLE TOOTH EXTRACTIONS    .  PARTIAL HIP ARTHROPLASTY    . POLYPECTOMY  08/21/2018   Procedure: POLYPECTOMY;  Surgeon: Thornton Park, MD;  Location: [place];  Service: Gastroenterology;;  . PORTACATH PLACEMENT    . REVERSE SHOULDER ARTHROPLASTY Left 01/22/2016   Procedure: LEFT REVERSE SHOULDER ARTHROPLASTY;  Surgeon: Netta Cedars, MD;  Location: [place];  Service: Orthopedics;  Laterality: Left;  . SHOULDER ARTHROSCOPY Bilateral   . SPLIT NIGHT STUDY  07/26/2015  . STERIOD INJECTION Right 01/22/2016   Procedure: RIGHT RING FINGER STEROID INJECTION;  Surgeon: Netta Cedars, MD;  Location: [place];   Service: Orthopedics;  Laterality: Right;  . TEE WITHOUT CARDIOVERSION N/A 05/26/2017   Procedure: TRANSESOPHAGEAL ECHOCARDIOGRAM (TEE);  Surgeon: Lelon Perla, MD;  Location: [place];  Service: Cardiovascular;  Laterality: N/A;  . THROMBECTOMY BRACHIAL ARTERY Left 02/06/2015   Procedure: EVACUATION OF LEFT ARM HEMATOMA;  Surgeon: Angelia Mould, MD;  Location: [place];  Service: Vascular;  Laterality: Left;  . TOTAL KNEE ARTHROPLASTY Bilateral   . TUBAL LIGATION      There were no vitals filed for this visit.   Subjective Assessment - 06/09/20 1620    Subjective COVID-19 screening performed upon arrival. Patient reports doing fairly well.    Pertinent History L posterior hemi-arthroplasty 02/19/2020, A-Fib, COPD, End stage renal disease, history of left reverse TSA, bilateral TKA, Hypothyroidism, bilateral ankle braces, OP (patient reports) Dialysis treatments, OA, HOH.    Limitations Sitting;Standing;Walking;House hold activities    How long can you stand comfortably? 2-3 minutes before L LE pain    How long can you walk comfortably? Very short distances with rolling walker    Patient Stated Goals move around safely and comfortably    Currently in Pain? No/denies              [facility] [name] Assessment - 06/09/20 0001      Assessment   Medical Diagnosis Right sided weakness    Referring Provider ([name]) [name], FNP    Next MD Visit 08/18/2020    Prior Therapy yes                         [place] Adult [name] Treatment/Exercise - 06/09/20 0001      Ambulation/Gait   Pre-Gait Activities standing at walker 2 x2 minutes with L LE tapping for R SLS and lateral weight shifting      Exercises   Exercises Knee/Hip;Ankle      Knee/Hip Exercises: Aerobic   Nustep Level 2 x10 min      Knee/Hip Exercises: Seated   Long Arc Quad AROM;Right;3 sets;10 reps    Heel Slides AAROM;Right;3 sets;10 reps    Other Seated Knee/Hip Exercises ball squeeze x30 reps; yellow R  clamshell x20; straight knee R hip abduction x20    Marching AROM;Right;3 sets;10 reps    Marching Limitations right only, yellow theraband      Ankle Exercises: Seated   Other Seated Ankle Exercises rockerboard x3 mins AP and lateral x3 mins                  [name] Education - 06/09/20 1547    Education Details seated R hip abduction with knee flexed and straight, heel slides    Person(s) Educated Patient    Methods Explanation;Demonstration;Handout    Comprehension Returned demonstration;Verbalized understanding               [name] Long Term Goals - 06/02/20 2019  [name] LONG TERM GOAL #1   Title Patient will be independent with HEP and its progression.    Time 4    Period Weeks    Status New      [name] LONG TERM GOAL #2   Title Patient will ambulate 10 feet with with rolling walker and step to step gait pattern with distant supervision to safely access areas of home.    Baseline 4 feet with min/mod assist and WC follow    Time 4    Period Weeks    Status New      [name] LONG TERM GOAL #3   Title Patient will perform sit to stand transfers from [facility] to rolling walker with distant supervision and proper hand placement for safe transfers from various surfaces.    Time 4    Period Weeks    Status New      [name] LONG TERM GOAL #4   Title Patient will demonstrate 4/5 or greater RIGHT LE MMT to improve stability during functional tasks.    Time 4    Period Weeks    Status New                 Plan - 06/09/20 1550    Clinical Impression Statement Patient was able to complete treatment with some reports of fatigue but overall performed well. Patient still needs min/mod A for sit to stand transfers. Patient has difficulties with moving R LE to pivot secondary to L hip pain with L SLS for transfers WC<>Nustep. Contact guard for stand to sit transfers with slow descent.  Patient was able to perform standing at walker for 2 minutes with various pre-gait activities.    Personal  Factors and Comorbidities Comorbidity 1;Comorbidity 2    Comorbidities L posterior hemi-arthroplasty 02/19/2020, A-Fib, COPD, End stage renal disease, history of left reverse TSA, bilateral TKA Hypothyroidism, bilateral ankle braces, OP (patient reports), DM, Dialysis treatments, OA, HOH.    Examination-Activity Limitations Locomotion Level;Transfers;Stand;Bed Mobility    Stability/Clinical Decision Making Evolving/Moderate complexity    Clinical Decision Making Moderate    Rehab Potential Fair    [name] Frequency 2x / week    [name] Duration 4 weeks    [name] Treatment/Interventions ADLs/Self Care Home Management;Functional mobility training;Stair training;Gait training;Therapeutic activities;Therapeutic exercise;Neuromuscular re-education;Balance training;Manual techniques;Passive range of motion;Patient/family education    [name] Next Visit Plan RIGHT LOWER EXTREMITY STRENGTHENING ONLY. Nustep, seated strengthening and core stability, increase standing tolerance, balance trainging and gait training to tolerance.    [name] Home Exercise Plan see patient education section    Consulted and Agree with Plan of Care Patient;Family member/caregiver           Patient will benefit from skilled therapeutic intervention in order to improve the following deficits and impairments:  Pain,Abnormal gait,Decreased activity tolerance,Decreased strength,Decreased balance,Difficulty walking,Decreased range of motion  Visit Diagnosis: Muscle weakness (generalized)  Difficulty in walking, not elsewhere classified  Unsteadiness on feet     Problem List Patient Active Problem List   Diagnosis Date Noted  . Controlled substance agreement signed 05/16/2019  . Arthritis 02/14/2019  . Chronic midline thoracic back pain 02/14/2019  . Restless leg syndrome 10/26/2018  . Vitamin D deficiency 10/26/2018  . Age-related osteoporosis without current pathological fracture 10/26/2018  . Hypertension   . Gout   . COPD (chronic  obstructive pulmonary disease) ([place])   . Anxiety   . 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 Adenomatous polyp of descending colon   .  Adenomatous polyp of transverse colon   . Depression 08/19/2018  . Pernicious anemia 12/12/2017  . Moderate protein-calorie malnutrition ([place]) 06/02/2017  . Gastroesophageal reflux disease without esophagitis   . Dyslipidemia 02/09/2016  . Do not resuscitate 03/31/2015  . PAF (paroxysmal atrial fibrillation) ([place]) 02/04/2015  . Hypothyroidism   . Anemia in chronic kidney disease 12/09/2014  . Steal syndrome dialysis vascular access ([place]) 12/03/2014  . Secondary hyperparathyroidism of renal origin ([place]) 11/17/2014  . Physical deconditioning 08/04/2013  . Chronic kidney disease due to type 2 diabetes mellitus ([place]) 07/26/2013  . ESRD (end stage renal disease) on dialysis [facility]) 03/15/2012    [name], [name], [name] 06/09/2020, 4:20 PM  [facility] [address] [place], [place], [address] Phone: [phone]   Fax:  [phone]  Name: [name] MRN: [id number] Date of Birth: [birth year]

## 2020-06-10 DIAGNOSIS — D689 Coagulation defect, unspecified: Secondary | ICD-10-CM | POA: Diagnosis not present

## 2020-06-10 DIAGNOSIS — Z992 Dependence on renal dialysis: Secondary | ICD-10-CM | POA: Diagnosis not present

## 2020-06-10 DIAGNOSIS — N2581 Secondary hyperparathyroidism of renal origin: Secondary | ICD-10-CM | POA: Diagnosis not present

## 2020-06-10 DIAGNOSIS — D509 Iron deficiency anemia, unspecified: Secondary | ICD-10-CM | POA: Diagnosis not present

## 2020-06-10 DIAGNOSIS — Z4901 Encounter for fitting and adjustment of extracorporeal dialysis catheter: Secondary | ICD-10-CM | POA: Diagnosis not present

## 2020-06-10 DIAGNOSIS — N186 End stage renal disease: Secondary | ICD-10-CM | POA: Diagnosis not present

## 2020-06-11 ENCOUNTER — Other Ambulatory Visit: Payer: Self-pay

## 2020-06-11 ENCOUNTER — Ambulatory Visit: Payer: Medicare Other | Admitting: Physical Therapy

## 2020-06-11 ENCOUNTER — Other Ambulatory Visit: Payer: Self-pay | Admitting: Family Medicine

## 2020-06-11 DIAGNOSIS — R262 Difficulty in walking, not elsewhere classified: Secondary | ICD-10-CM

## 2020-06-11 DIAGNOSIS — R2681 Unsteadiness on feet: Secondary | ICD-10-CM

## 2020-06-11 DIAGNOSIS — M6281 Muscle weakness (generalized): Secondary | ICD-10-CM | POA: Diagnosis not present

## 2020-06-11 DIAGNOSIS — E039 Hypothyroidism, unspecified: Secondary | ICD-10-CM

## 2020-06-11 DIAGNOSIS — M25552 Pain in left hip: Secondary | ICD-10-CM | POA: Diagnosis not present

## 2020-06-11 DIAGNOSIS — M25562 Pain in left knee: Secondary | ICD-10-CM | POA: Diagnosis not present

## 2020-06-11 NOTE — Therapy (Signed)
[facility] [place], [place], [address] Phone: [phone]   Fax:  [phone]  Physical Therapy Treatment  Patient Details  Name: [name] MRN: [id number] Date of Birth: Nov 26, 1940 Referring Provider (PT): [name], FNP   Encounter Date: 06/11/2020   PT End of Session - 06/11/20 1127    Visit Number 4    Number of Visits 8    Date for PT Re-Evaluation 07/07/20    Authorization Type [employer] Medicare ([place] modifier); Progress note every 10th visit    PT Start Time 1031    PT Stop Time 1114    PT Time Calculation (min) 43 min    Activity Tolerance Patient tolerated treatment well;Patient limited by pain    Behavior During Therapy [facility] for tasks assessed/performed           Past Medical History:  Diagnosis Date  . Acute respiratory failure ([place]) 05/2017  . Anemia   . Anxiety   . Arthritis   . COPD (chronic obstructive pulmonary disease) ([place])   . Depression   . ESRD (end stage renal disease) ([place])    dialysis MWF  . GERD (gastroesophageal reflux disease)   . Gout   . History of blood transfusion   . History of diabetes mellitus    "diet controlled"  . HLD (hyperlipidemia)   . HOH (hard of hearing)    left ear  . Hypertension    hypotensive -since starting dialysis  . Hypothyroidism   . MRSA (methicillin resistant staph aureus) culture positive 06/01/2017  . PAF (paroxysmal atrial fibrillation) ([place])    a. Echo 11/16:  Mild LVH, EF 55-60%, normal wall motion, MAC, mild MR, severe LAE (49 ml/m2), mild RVE, normal RVSF, mild RAE, mild TR, PASP 24 mmHg;  CHADS2-VASc: 4 >> Coumadin followed by PCP    Past Surgical History:  Procedure Laterality Date  . AV FISTULA PLACEMENT  04/03/2012   Procedure: ARTERIOVENOUS (AV) FISTULA CREATION;  Surgeon: Elam Dutch, MD;  Location: [place];  Service: Vascular;  Laterality: Left;  creation left brachial cephalic fistula   . BIOPSY  08/21/2018    Procedure: BIOPSY;  Surgeon: Thornton Park, MD;  Location: [place];  Service: Gastroenterology;;  . CARPAL TUNNEL RELEASE Bilateral   . COLONOSCOPY W/ POLYPECTOMY    . COLONOSCOPY WITH PROPOFOL N/A 08/21/2018   Procedure: COLONOSCOPY WITH PROPOFOL;  Surgeon: Thornton Park, MD;  Location: [place];  Service: Gastroenterology;  Laterality: N/A;  . DILATION AND CURETTAGE OF UTERUS    . ESOPHAGOGASTRODUODENOSCOPY (EGD) WITH PROPOFOL N/A 08/21/2018   Procedure: ESOPHAGOGASTRODUODENOSCOPY (EGD) WITH PROPOFOL;  Surgeon: Thornton Park, MD;  Location: [place];  Service: Gastroenterology;  Laterality: N/A;  . EYE SURGERY Bilateral    bilateral cataract removal  . HEMATOMA EVACUATION Left 05/14/2018   Procedure: Incision and Drainage of Left Arm Hematoma;  Surgeon: Elam Dutch, MD;  Location: [facility] OR;  Service: Vascular;  Laterality: Left;  . Hemodialysis  catheter Right   . IR FLUORO GUIDE CV LINE RIGHT  05/26/2017  . IR REMOVAL TUN CV CATH W/O FL  05/24/2017  . IR US GUIDE VASC ACCESS RIGHT  05/26/2017  . LIGATION OF ARTERIOVENOUS  FISTULA Left 02/04/2015   Procedure: LIGATION OF BRACHIOCEPHALIC ARTERIOVENOUS  FISTULA;  Surgeon: Conrad Atlantis, MD;  Location: [place];  Service: Vascular;  Laterality: Left;  Marland Kitchen MULTIPLE TOOTH EXTRACTIONS    . PARTIAL HIP ARTHROPLASTY    . POLYPECTOMY  08/21/2018   Procedure: POLYPECTOMY;  Surgeon: Thornton Park, MD;  Location: [place];  Service: Gastroenterology;;  . PORTACATH PLACEMENT    . REVERSE SHOULDER ARTHROPLASTY Left 01/22/2016   Procedure: LEFT REVERSE SHOULDER ARTHROPLASTY;  Surgeon: Netta Cedars, MD;  Location: [place];  Service: Orthopedics;  Laterality: Left;  . SHOULDER ARTHROSCOPY Bilateral   . SPLIT NIGHT STUDY  07/26/2015  . STERIOD INJECTION Right 01/22/2016   Procedure: RIGHT RING FINGER STEROID INJECTION;  Surgeon: Netta Cedars, MD;  Location: [place];  Service: Orthopedics;  Laterality: Right;  . TEE WITHOUT  CARDIOVERSION N/A 05/26/2017   Procedure: TRANSESOPHAGEAL ECHOCARDIOGRAM (TEE);  Surgeon: Lelon Perla, MD;  Location: [place];  Service: Cardiovascular;  Laterality: N/A;  . THROMBECTOMY BRACHIAL ARTERY Left 02/06/2015   Procedure: EVACUATION OF LEFT ARM HEMATOMA;  Surgeon: Angelia Mould, MD;  Location: [place];  Service: Vascular;  Laterality: Left;  . TOTAL KNEE ARTHROPLASTY Bilateral   . TUBAL LIGATION      There were no vitals filed for this visit.   Subjective Assessment - 06/11/20 1031    Subjective COVID-19 screening performed upon arrival. Patient arrived doing good per reported    Patient is accompained by: Family member    Pertinent History L posterior hemi-arthroplasty 02/19/2020, A-Fib, COPD, End stage renal disease, history of left reverse TSA, bilateral TKA, Hypothyroidism, bilateral ankle braces, OP (patient reports) Dialysis treatments, OA, HOH.    Limitations Sitting;Standing;Walking;House hold activities    How long can you stand comfortably? 2-3 minutes before L LE pain    How long can you walk comfortably? Very short distances with rolling walker    Patient Stated Goals move around safely and comfortably    Currently in Pain? No/denies                             [facility] Adult PT Treatment/Exercise - 06/11/20 0001      Knee/Hip Exercises: Aerobic   Nustep L3 x72mn UE/LE      Knee/Hip Exercises: Seated   Long Arc Quad Strengthening;Right;2 sets;15 reps;Weights    Long Arc Quad Weight 3 lbs.    Ball Squeeze 10sec x20reps    Other Seated Knee/Hip Exercises seated reaching for cones bil UE 2x10, walker in front for core push downs x10    Other Seated Knee/Hip Exercises manual resistance hip abd x10 10sec holds    Marching Strengthening;Right;2 sets;10 reps    Marching Limitations hip/knee ext with red band x20    Marching Weights 3 lbs.    Hamstring Curl Strengthening;Right;20 reps    Hamstring Limitations yellow band    Sit to  Sand with UE support   mod assist x4                      PT Long Term Goals - 06/11/20 1041      PT LONG TERM GOAL #1   Title Patient will be independent with HEP and its progression.    Time 4    Period Weeks    Status On-going      PT LONG TERM GOAL #2   Title Patient will ambulate 10 feet with with rolling walker and step to step gait pattern with distant supervision to safely access areas of home.    Baseline 4 feet with min/mod assist and WC follow    Time 4    Period Weeks    Status On-going  PT LONG TERM GOAL #3   Title Patient will perform sit to stand transfers from [facility] to rolling walker with distant supervision and proper hand placement for safe transfers from various surfaces.    Time 4    Period Weeks    Status On-going      PT LONG TERM GOAL #4   Title Patient will demonstrate 4/5 or greater RIGHT LE MMT to improve stability during functional tasks.    Time 4    Period Weeks    Status On-going                 Plan - 06/11/20 1158    Clinical Impression Statement Patient tolerated treatment fair today due to difficulty with oher medical issues on left LE and weakness on current condition with right LE. Patient required min/mod assit with transfers today. Patient and family member reported that she has difficulty with left LE from other previous medical conditions and to focus on right only but with care for left LE. Patient able to progress with 3# weights due to patient doing 2# at home per reported. Patient able to progress with core activity and reaching to help improve stance time onto right LE. Patient doing HEP with family memeberand today continued to educate patient on HEP imporatance to improve functional independence. Goals ongoing this week.    Personal Factors and Comorbidities Comorbidity 1;Comorbidity 2    Comorbidities L posterior hemi-arthroplasty 02/19/2020, A-Fib, COPD, End stage renal disease, history of left reverse TSA,  bilateral TKA Hypothyroidism, bilateral ankle braces, OP (patient reports), DM, Dialysis treatments, OA, HOH.    Examination-Activity Limitations Locomotion Level;Transfers;Stand;Bed Mobility    Stability/Clinical Decision Making Evolving/Moderate complexity    Rehab Potential Fair    PT Frequency 2x / week    PT Duration 4 weeks    PT Treatment/Interventions ADLs/Self Care Home Management;Functional mobility training;Stair training;Gait training;Therapeutic activities;Therapeutic exercise;Neuromuscular re-education;Balance training;Manual techniques;Passive range of motion;Patient/family education    PT Next Visit Plan RIGHT LOWER EXTREMITY STRENGTHENING ONLY. Nustep, seated strengthening and core stability, increase standing tolerance, balance trainging and gait training to tolerance.    Consulted and Agree with Plan of Care Patient;Family member/caregiver           Patient will benefit from skilled therapeutic intervention in order to improve the following deficits and impairments:  Pain,Abnormal gait,Decreased activity tolerance,Decreased strength,Decreased balance,Difficulty walking,Decreased range of motion  Visit Diagnosis: Muscle weakness (generalized)  Difficulty in walking, not elsewhere classified  Unsteadiness on feet     Problem List Patient Active Problem List   Diagnosis Date Noted  . Controlled substance agreement signed 05/16/2019  . Arthritis 02/14/2019  . Chronic midline thoracic back pain 02/14/2019  . Restless leg syndrome 10/26/2018  . Vitamin D deficiency 10/26/2018  . Age-related osteoporosis without current pathological fracture 10/26/2018  . Hypertension   . Gout   . COPD (chronic obstructive pulmonary disease) ([place])   . Anxiety   . 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 Adenomatous polyp of descending colon   . Adenomatous polyp of transverse colon   . Depression 08/19/2018  . Pernicious anemia 12/12/2017  . Moderate protein-calorie  malnutrition ([place]) 06/02/2017  . Gastroesophageal reflux disease without esophagitis   . Dyslipidemia 02/09/2016  . Do not resuscitate 03/31/2015  . PAF (paroxysmal atrial fibrillation) ([place]) 02/04/2015  . Hypothyroidism   . Anemia in chronic kidney disease 12/09/2014  . Steal syndrome dialysis vascular access ([place]) 12/03/2014  . Secondary hyperparathyroidism of renal origin (  [place]) 11/17/2014  . Physical deconditioning 08/04/2013  . Chronic kidney disease due to type 2 diabetes mellitus ([place]) 07/26/2013  . ESRD (end stage renal disease) on dialysis ([place]) 03/15/2012    Phillips Climes, PTA 06/11/2020, 12:08 PM  [facility] [address] [place], [place], [address] Phone: [phone]   Fax:  [phone]  Name: [name] MRN: [id number] Date of Birth: [birth year]

## 2020-06-12 DIAGNOSIS — D689 Coagulation defect, unspecified: Secondary | ICD-10-CM | POA: Diagnosis not present

## 2020-06-12 DIAGNOSIS — N186 End stage renal disease: Secondary | ICD-10-CM | POA: Diagnosis not present

## 2020-06-12 DIAGNOSIS — D509 Iron deficiency anemia, unspecified: Secondary | ICD-10-CM | POA: Diagnosis not present

## 2020-06-12 DIAGNOSIS — N2581 Secondary hyperparathyroidism of renal origin: Secondary | ICD-10-CM | POA: Diagnosis not present

## 2020-06-12 DIAGNOSIS — Z4901 Encounter for fitting and adjustment of extracorporeal dialysis catheter: Secondary | ICD-10-CM | POA: Diagnosis not present

## 2020-06-12 DIAGNOSIS — Z992 Dependence on renal dialysis: Secondary | ICD-10-CM | POA: Diagnosis not present

## 2020-06-15 DIAGNOSIS — D631 Anemia in chronic kidney disease: Secondary | ICD-10-CM | POA: Diagnosis not present

## 2020-06-15 DIAGNOSIS — D689 Coagulation defect, unspecified: Secondary | ICD-10-CM | POA: Diagnosis not present

## 2020-06-15 DIAGNOSIS — N2581 Secondary hyperparathyroidism of renal origin: Secondary | ICD-10-CM | POA: Diagnosis not present

## 2020-06-15 DIAGNOSIS — Z4901 Encounter for fitting and adjustment of extracorporeal dialysis catheter: Secondary | ICD-10-CM | POA: Diagnosis not present

## 2020-06-15 DIAGNOSIS — Z992 Dependence on renal dialysis: Secondary | ICD-10-CM | POA: Diagnosis not present

## 2020-06-15 DIAGNOSIS — N186 End stage renal disease: Secondary | ICD-10-CM | POA: Diagnosis not present

## 2020-06-16 ENCOUNTER — Ambulatory Visit: Payer: Medicare Other | Admitting: Physical Therapy

## 2020-06-16 DIAGNOSIS — A498 Other bacterial infections of unspecified site: Secondary | ICD-10-CM | POA: Diagnosis not present

## 2020-06-16 DIAGNOSIS — N186 End stage renal disease: Secondary | ICD-10-CM | POA: Diagnosis not present

## 2020-06-16 DIAGNOSIS — T8459XD Infection and inflammatory reaction due to other internal joint prosthesis, subsequent encounter: Secondary | ICD-10-CM | POA: Diagnosis not present

## 2020-06-16 DIAGNOSIS — T8452XD Infection and inflammatory reaction due to internal left hip prosthesis, subsequent encounter: Secondary | ICD-10-CM | POA: Diagnosis not present

## 2020-06-16 DIAGNOSIS — Z992 Dependence on renal dialysis: Secondary | ICD-10-CM | POA: Diagnosis not present

## 2020-06-16 DIAGNOSIS — Q682 Congenital deformity of knee: Secondary | ICD-10-CM | POA: Diagnosis not present

## 2020-06-16 DIAGNOSIS — Z96649 Presence of unspecified artificial hip joint: Secondary | ICD-10-CM | POA: Diagnosis not present

## 2020-06-17 DIAGNOSIS — D631 Anemia in chronic kidney disease: Secondary | ICD-10-CM | POA: Diagnosis not present

## 2020-06-17 DIAGNOSIS — D689 Coagulation defect, unspecified: Secondary | ICD-10-CM | POA: Diagnosis not present

## 2020-06-17 DIAGNOSIS — Z4901 Encounter for fitting and adjustment of extracorporeal dialysis catheter: Secondary | ICD-10-CM | POA: Diagnosis not present

## 2020-06-17 DIAGNOSIS — N186 End stage renal disease: Secondary | ICD-10-CM | POA: Diagnosis not present

## 2020-06-17 DIAGNOSIS — N2581 Secondary hyperparathyroidism of renal origin: Secondary | ICD-10-CM | POA: Diagnosis not present

## 2020-06-17 DIAGNOSIS — Z992 Dependence on renal dialysis: Secondary | ICD-10-CM | POA: Diagnosis not present

## 2020-06-18 ENCOUNTER — Ambulatory Visit: Payer: Medicare Other | Admitting: Physical Therapy

## 2020-06-18 ENCOUNTER — Other Ambulatory Visit: Payer: Self-pay

## 2020-06-18 DIAGNOSIS — R262 Difficulty in walking, not elsewhere classified: Secondary | ICD-10-CM | POA: Diagnosis not present

## 2020-06-18 DIAGNOSIS — M6281 Muscle weakness (generalized): Secondary | ICD-10-CM | POA: Diagnosis not present

## 2020-06-18 DIAGNOSIS — M79676 Pain in unspecified toe(s): Secondary | ICD-10-CM | POA: Diagnosis not present

## 2020-06-18 DIAGNOSIS — B351 Tinea unguium: Secondary | ICD-10-CM | POA: Diagnosis not present

## 2020-06-18 DIAGNOSIS — R2681 Unsteadiness on feet: Secondary | ICD-10-CM | POA: Diagnosis not present

## 2020-06-18 DIAGNOSIS — L84 Corns and callosities: Secondary | ICD-10-CM | POA: Diagnosis not present

## 2020-06-18 DIAGNOSIS — I70203 Unspecified atherosclerosis of native arteries of extremities, bilateral legs: Secondary | ICD-10-CM | POA: Diagnosis not present

## 2020-06-18 NOTE — Therapy (Addendum)
[facility] [place], [place], [address] Phone: [phone]   Fax:  [phone]  Physical Therapy Treatment PHYSICAL THERAPY DISCHARGE SUMMARY  Visits from Start of Care: 5  Current functional level related to goals / functional outcomes: See below   Remaining deficits: See goals   Education / Equipment: HEP  Plan: Patient agrees to discharge.  Patient goals were partially met. Patient is being discharged due to lack of progress.  ?????  Recommending home health physical therapy due to limitations.   Patient Details  Name: [name] MRN: [id number] Date of Birth: [birth year] Referring Provider (PT): [name], FNP    Encounter Date: 06/18/2020   PT End of Session - 06/18/20 1113    Visit Number 5    Number of Visits 8    Date for PT Re-Evaluation 07/07/20    Authorization Type [employer] Medicare ([place] modifier); Progress note every 10th visit    PT Start Time 1030    PT Stop Time 1111    PT Time Calculation (min) 41 min    Activity Tolerance Treatment limited secondary to medical complications (Comment);Patient tolerated treatment well    Behavior During Therapy [facility] for tasks assessed/performed           Past Medical History:  Diagnosis Date  . Acute respiratory failure ([place]) 05/2017  . Anemia   . Anxiety   . Arthritis   . COPD (chronic obstructive pulmonary disease) ([place])   . Depression   . ESRD (end stage renal disease) ([place])    dialysis MWF  . GERD (gastroesophageal reflux disease)   . Gout   . History of blood transfusion   . History of diabetes mellitus    "diet controlled"  . HLD (hyperlipidemia)   . HOH (hard of hearing)    left ear  . Hypertension    hypotensive -since starting dialysis  . Hypothyroidism   . MRSA (methicillin resistant staph aureus) culture positive 06/01/2017  . PAF (paroxysmal atrial fibrillation) ([place])    a. Echo 11/16:  Mild LVH, EF 55-60%,  normal wall motion, MAC, mild MR, severe LAE (49 ml/m2), mild RVE, normal RVSF, mild RAE, mild TR, PASP 24 mmHg;  CHADS2-VASc: 4 >> Coumadin followed by PCP    Past Surgical History:  Procedure Laterality Date  . AV FISTULA PLACEMENT  04/03/2012   Procedure: ARTERIOVENOUS (AV) FISTULA CREATION;  Surgeon: Elam Dutch, MD;  Location: [facility];  Service: Vascular;  Laterality: Left;  creation left brachial cephalic fistula   . BIOPSY  08/21/2018   Procedure: BIOPSY;  Surgeon: Thornton Park, MD;  Location: [place];  Service: Gastroenterology;;  . CARPAL TUNNEL RELEASE Bilateral   . COLONOSCOPY W/ POLYPECTOMY    . COLONOSCOPY WITH PROPOFOL N/A 08/21/2018   Procedure: COLONOSCOPY WITH PROPOFOL;  Surgeon: Thornton Park, MD;  Location: [place];  Service: Gastroenterology;  Laterality: N/A;  . DILATION AND CURETTAGE OF UTERUS    . ESOPHAGOGASTRODUODENOSCOPY (EGD) WITH PROPOFOL N/A 08/21/2018   Procedure: ESOPHAGOGASTRODUODENOSCOPY (EGD) WITH PROPOFOL;  Surgeon: Thornton Park, MD;  Location: [facility];  Service: Gastroenterology;  Laterality: N/A;  . EYE SURGERY Bilateral    bilateral cataract removal  . HEMATOMA EVACUATION Left 05/14/2018   Procedure: Incision and Drainage of Left Arm Hematoma;  Surgeon: Elam Dutch, MD;  Location: [facility] OR;  Service: Vascular;  Laterality: Left;  . Hemodialysis  catheter Right   . IR FLUORO GUIDE CV LINE RIGHT  05/26/2017  .  IR REMOVAL TUN CV CATH W/O FL  05/24/2017  . IR US GUIDE VASC ACCESS RIGHT  05/26/2017  . LIGATION OF ARTERIOVENOUS  FISTULA Left 02/04/2015   Procedure: LIGATION OF BRACHIOCEPHALIC ARTERIOVENOUS  FISTULA;  Surgeon: Conrad Wilburton, MD;  Location: [place];  Service: Vascular;  Laterality: Left;  Marland Kitchen MULTIPLE TOOTH EXTRACTIONS    . PARTIAL HIP ARTHROPLASTY    . POLYPECTOMY  08/21/2018   Procedure: POLYPECTOMY;  Surgeon: Thornton Park, MD;  Location: [facility];  Service: Gastroenterology;;  . PORTACATH PLACEMENT    .  REVERSE SHOULDER ARTHROPLASTY Left 01/22/2016   Procedure: LEFT REVERSE SHOULDER ARTHROPLASTY;  Surgeon: Netta Cedars, MD;  Location: [facility];  Service: Orthopedics;  Laterality: Left;  . SHOULDER ARTHROSCOPY Bilateral   . SPLIT NIGHT STUDY  07/26/2015  . STERIOD INJECTION Right 01/22/2016   Procedure: RIGHT RING FINGER STEROID INJECTION;  Surgeon: Netta Cedars, MD;  Location: [place];  Service: Orthopedics;  Laterality: Right;  . TEE WITHOUT CARDIOVERSION N/A 05/26/2017   Procedure: TRANSESOPHAGEAL ECHOCARDIOGRAM (TEE);  Surgeon: Lelon Perla, MD;  Location: [place];  Service: Cardiovascular;  Laterality: N/A;  . THROMBECTOMY BRACHIAL ARTERY Left 02/06/2015   Procedure: EVACUATION OF LEFT ARM HEMATOMA;  Surgeon: Angelia Mould, MD;  Location: [place];  Service: Vascular;  Laterality: Left;  . TOTAL KNEE ARTHROPLASTY Bilateral   . TUBAL LIGATION      There were no vitals filed for this visit.   Subjective Assessment - 06/18/20 1111    Subjective COVID-19 screening performed upon arrival. Patient arrived with a brace and immobilizer on left leg today and unable to perform and activities with left LE, due to MD reported not healing from previous injury.    Patient is accompained by: Family member    Pertinent History L posterior hemi-arthroplasty 02/19/2020, A-Fib, COPD, End stage renal disease, history of left reverse TSA, bilateral TKA, Hypothyroidism, bilateral ankle braces, OP (patient reports) Dialysis treatments, OA, HOH.    Limitations Sitting;Standing;Walking;House hold activities    How long can you stand comfortably? 2-3 minutes before L LE pain    How long can you walk comfortably? Very short distances with rolling walker    Patient Stated Goals move around safely and comfortably    Currently in Pain? No/denies                             [facility] Adult PT Treatment/Exercise - 06/18/20 0001      Knee/Hip Exercises: Seated   Long Arc Quad  Strengthening;Right;2 sets;15 reps;Weights    Long Arc Quad Weight 3 lbs.    Ball Squeeze 10sec x20reps    Other Seated Knee/Hip Exercises seated glut sets x30, seated core set push down x30    Other Seated Knee/Hip Exercises seated for reaching for ball forward 2x10, side to side 2x10, bil UE overhead x10    Marching Strengthening;Right;2 sets;10 reps    Marching Limitations hip/knee ext with red band x20    Marching Weights 3 lbs.    Hamstring Curl Strengthening;Right;20 reps    Hamstring Limitations yellow band                  PT Education - 06/18/20 1054    Education Details HEP progression    Person(s) Educated Patient;Caregiver(s)    Methods Explanation;Demonstration;Handout    Comprehension Verbalized understanding;Returned demonstration               PT  Long Term Goals - 06/18/20 1114      PT LONG TERM GOAL #1   Title Patient will be independent with HEP and its progression.    Baseline met 06/18/20 issued today    Time 4    Period Weeks    Status Achieved      PT LONG TERM GOAL #2   Title Patient will ambulate 10 feet with with rolling walker and step to step gait pattern with distant supervision to safely access areas of home.    Baseline unable to walk due to left LE injury    Time 4    Period Weeks    Status Not Met      PT LONG TERM GOAL #3   Title Patient will perform sit to stand transfers from [facility] to rolling walker with distant supervision and proper hand placement for safe transfers from various surfaces.    Baseline unable to transfer with supervision, patient requires mod assist    Time 4    Period Weeks    Status Not Met      PT LONG TERM GOAL #4   Title Patient will demonstrate 4/5 or greater RIGHT LE MMT to improve stability during functional tasks.    Time 4    Period Weeks    Status Not Met                 Plan - 06/18/20 1119    Clinical Impression Statement Patient tolerated treatment fair due to limitations with left  LE and unable to put weight on left side per MD reported her previous injury not healing and in immobilizer. Today focused on seated right LE strengthening with core reaching progression. Educated patient and cargiver on HEP progression to perform standing only when able with MD approval and perform seated activities daily to improve functional independence. Disscussed and recommend home health therapy to help patient with home environment and prevent future falls or injury. Today patient reported a fall on monday that her family was able to prevent full fall but caught her which caused bruises on bil UE. Patient unable to meet any goals except HEP due to medical condition. DC per PT today.    Personal Factors and Comorbidities Comorbidity 1;Comorbidity 2    Comorbidities L posterior hemi-arthroplasty 02/19/2020, A-Fib, COPD, End stage renal disease, history of left reverse TSA, bilateral TKA Hypothyroidism, bilateral ankle braces, OP (patient reports), DM, Dialysis treatments, OA, HOH.    Examination-Activity Limitations Locomotion Level;Transfers;Stand;Bed Mobility    Stability/Clinical Decision Making Evolving/Moderate complexity    Rehab Potential Fair    PT Frequency 2x / week    PT Duration 4 weeks    PT Treatment/Interventions ADLs/Self Care Home Management;Functional mobility training;Stair training;Gait training;Therapeutic activities;Therapeutic exercise;Neuromuscular re-education;Balance training;Manual techniques;Passive range of motion;Patient/family education    PT Next Visit Plan DC and recommend home health therapy per MD approval    Consulted and Agree with Plan of Care Patient;Family member/caregiver           Patient will benefit from skilled therapeutic intervention in order to improve the following deficits and impairments:  Pain,Abnormal gait,Decreased activity tolerance,Decreased strength,Decreased balance,Difficulty walking,Decreased range of motion  Visit Diagnosis: Muscle  weakness (generalized)  Difficulty in walking, not elsewhere classified  Unsteadiness on feet     Problem List Patient Active Problem List   Diagnosis Date Noted  . Controlled substance agreement signed 05/16/2019  . Arthritis 02/14/2019  . Chronic midline thoracic back pain 02/14/2019  . Restless  leg syndrome 10/26/2018  . Vitamin D deficiency 10/26/2018  . Age-related osteoporosis without current pathological fracture 10/26/2018  . Hypertension   . Gout   . COPD (chronic obstructive pulmonary disease) ([place])   . Anxiety   . 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 Adenomatous polyp of descending colon   . Adenomatous polyp of transverse colon   . Depression 08/19/2018  . Pernicious anemia 12/12/2017  . Moderate protein-calorie malnutrition ([place]) 06/02/2017  . Gastroesophageal reflux disease without esophagitis   . Dyslipidemia 02/09/2016  . Do not resuscitate 03/31/2015  . PAF (paroxysmal atrial fibrillation) ([place]) 02/04/2015  . Hypothyroidism   . Anemia in chronic kidney disease 12/09/2014  . Steal syndrome dialysis vascular access ([place]) 12/03/2014  . Secondary hyperparathyroidism of renal origin ([place]) 11/17/2014  . Physical deconditioning 08/04/2013  . Chronic kidney disease due to type 2 diabetes mellitus ([place]) 07/26/2013  . ESRD (end stage renal disease) on dialysis [facility]) 03/15/2012    Ladean Raya, PTA 06/18/20 11:32 AM  [facility] [place], [place], [address] Phone: [phone]   Fax:  [phone]  Name: BRIER FIREBAUGH MRN: [id number] Date of Birth: 08-19-1940

## 2020-06-18 NOTE — Patient Instructions (Signed)
  High Stepping   Using support, lift knees, taking high steps. Repeat __5-10__ times. Do __1-2__ sessions per day.   Walk to the Side   Step to the side with stronger leg and follow with involved leg. Then return. Hold chair if necessary. Repeat __5-10__ times. Do _1-2___ sessions per day.   Lower Body: Toe Rise   Standing, place feet apart. Hold arms out for balance or use support. Rise up on toes. Hold _3___ seconds, then lower. Repeat immediately. Repeat __10__ times. Do __2__ sessions per day.   Half Squat to Chair   Stand with feet shoulder width apart. Push buttocks backward and lower slowly, sitting in chair lightly and returning to standing position. Complete _2_ sets of 10_ repetitions. Perform __2-3_ sessions per day.     Hip abduction   While sitting with good posture, tie theraband around knees and pull apart. Slowly resume starting position. x30 1-2 x day   Knee Flexion: Resisted (Sitting)      SEATED MARCHING - ELASTIC BAND  Start by sitting in a chair with an elastic band wrapped around your lower thighs.  Next, move a knee upward, set it back down and then alternate to the other side. Perform 10-20 1-2x daily    Knee Extension (Sitting)   Place __0-3__ pound weight on left ankle and straighten knee fully, lower slowly. Repeat _10___ times per set. Do __2-3__ sets per session. Do __2-3__ sessions per day.    Straight Leg Raise   Tighten stomach and slowly raise locked right leg __4__ inches from floor. Repeat __10-30__ times per set. Do __2__ sets per session. Do __2__ sessions per day.     Glute Set  In a seated position, contract glute muscles.     Core Set   Sit upright on the edge of chair (ears in line with shoulders) Engage your core (stomach bracing)  Place a stable object in front of you, Shoulder level ( foam roll, table or back of a chair) Place Arms on top of the object ( hand over hand placement)  Press down with  arms 20-30% of your strength

## 2020-06-19 DIAGNOSIS — D689 Coagulation defect, unspecified: Secondary | ICD-10-CM | POA: Diagnosis not present

## 2020-06-19 DIAGNOSIS — N186 End stage renal disease: Secondary | ICD-10-CM | POA: Diagnosis not present

## 2020-06-19 DIAGNOSIS — N2581 Secondary hyperparathyroidism of renal origin: Secondary | ICD-10-CM | POA: Diagnosis not present

## 2020-06-19 DIAGNOSIS — D631 Anemia in chronic kidney disease: Secondary | ICD-10-CM | POA: Diagnosis not present

## 2020-06-19 DIAGNOSIS — Z992 Dependence on renal dialysis: Secondary | ICD-10-CM | POA: Diagnosis not present

## 2020-06-19 DIAGNOSIS — Z4901 Encounter for fitting and adjustment of extracorporeal dialysis catheter: Secondary | ICD-10-CM | POA: Diagnosis not present

## 2020-06-22 DIAGNOSIS — D689 Coagulation defect, unspecified: Secondary | ICD-10-CM | POA: Diagnosis not present

## 2020-06-22 DIAGNOSIS — Z992 Dependence on renal dialysis: Secondary | ICD-10-CM | POA: Diagnosis not present

## 2020-06-22 DIAGNOSIS — Z4901 Encounter for fitting and adjustment of extracorporeal dialysis catheter: Secondary | ICD-10-CM | POA: Diagnosis not present

## 2020-06-22 DIAGNOSIS — N2581 Secondary hyperparathyroidism of renal origin: Secondary | ICD-10-CM | POA: Diagnosis not present

## 2020-06-22 DIAGNOSIS — N186 End stage renal disease: Secondary | ICD-10-CM | POA: Diagnosis not present

## 2020-06-22 DIAGNOSIS — R519 Headache, unspecified: Secondary | ICD-10-CM | POA: Diagnosis not present

## 2020-06-22 NOTE — Progress Notes (Signed)
Appointment scheduled.

## 2020-06-24 DIAGNOSIS — D689 Coagulation defect, unspecified: Secondary | ICD-10-CM | POA: Diagnosis not present

## 2020-06-24 DIAGNOSIS — Z992 Dependence on renal dialysis: Secondary | ICD-10-CM | POA: Diagnosis not present

## 2020-06-24 DIAGNOSIS — N185 Chronic kidney disease, stage 5: Secondary | ICD-10-CM | POA: Diagnosis not present

## 2020-06-24 DIAGNOSIS — M1990 Unspecified osteoarthritis, unspecified site: Secondary | ICD-10-CM | POA: Diagnosis not present

## 2020-06-24 DIAGNOSIS — R519 Headache, unspecified: Secondary | ICD-10-CM | POA: Diagnosis not present

## 2020-06-24 DIAGNOSIS — Z4901 Encounter for fitting and adjustment of extracorporeal dialysis catheter: Secondary | ICD-10-CM | POA: Diagnosis not present

## 2020-06-24 DIAGNOSIS — I480 Paroxysmal atrial fibrillation: Secondary | ICD-10-CM | POA: Diagnosis not present

## 2020-06-24 DIAGNOSIS — N2581 Secondary hyperparathyroidism of renal origin: Secondary | ICD-10-CM | POA: Diagnosis not present

## 2020-06-24 DIAGNOSIS — J9621 Acute and chronic respiratory failure with hypoxia: Secondary | ICD-10-CM | POA: Diagnosis not present

## 2020-06-24 DIAGNOSIS — N186 End stage renal disease: Secondary | ICD-10-CM | POA: Diagnosis not present

## 2020-06-25 ENCOUNTER — Encounter: Payer: Self-pay | Admitting: Family Medicine

## 2020-06-25 ENCOUNTER — Telehealth (INDEPENDENT_AMBULATORY_CARE_PROVIDER_SITE_OTHER): Payer: Medicare Other | Admitting: Family Medicine

## 2020-06-25 DIAGNOSIS — R531 Weakness: Secondary | ICD-10-CM

## 2020-06-25 NOTE — Progress Notes (Signed)
Virtual Visit via Video note  I connected with [name] on 06/25/20 at 9:55 AM by video and verified that I am speaking with the correct person using two identifiers. [name] is currently located at home and her daughter-in-law is currently with her during visit. The provider, [name], FNP is located in their home at time of visit.  I discussed the limitations, risks, security and privacy concerns of performing an evaluation and management service by video and the availability of in person appointments. I also discussed with the patient that there may be a patient responsible charge related to this service. The patient expressed understanding and agreed to proceed.  Subjective: PCP: [name], FNP  Chief Complaint  Patient presents with  . Leg Pain   Patient has been dragging her right leg and having weakness for about 2 months now after a left hip replacement.  She was previously referred to outpatient physical therapy but is not progressing as they would hope.  It was recommended she have home health physical therapy who could also help assess for home safety.   ROS: Per HPI  Current Outpatient Medications:  .  acetaminophen (TYLENOL) 500 MG tablet, Take 2 tablets (1,000 mg total) by mouth every 8 (eight) hours as needed for mild pain or headache., Disp: 30 tablet, Rfl: 0 .  albuterol (VENTOLIN HFA) 108 (90 Base) MCG/ACT inhaler, Inhale 2 puffs into the lungs every 6 (six) hours as needed for wheezing or shortness of breath., Disp: 18 g, Rfl: 2 .  alendronate (FOSAMAX) 70 MG tablet, Take 1 tablet (70 mg total) by mouth every Thursday., Disp: 12 tablet, Rfl: 3 .  aspirin EC 81 MG tablet, Take 81 mg by mouth daily., Disp: , Rfl:  .  budesonide-formoterol (SYMBICORT) 80-4.5 MCG/ACT inhaler, Inhale 2 puffs into the lungs 2 (two) times daily., Disp: 3 each, Rfl: 4 .  cinacalcet (SENSIPAR) 30 MG tablet, Take 1 tablet (30 mg total) by mouth every Monday, Wednesday, and  Friday., Disp: 60 tablet, Rfl: 0 .  docusate calcium (SURFAK) 240 MG capsule, Take 1 capsule (240 mg total) by mouth daily., Disp: 90 capsule, Rfl: 1 .  doxercalciferol (HECTOROL) 4 MCG/2ML injection, Inject 0.5 mLs (1 mcg total) into the vein every Monday, Wednesday, and Friday with hemodialysis., Disp: 2 mL, Rfl: 0 .  ezetimibe (ZETIA) 10 MG tablet, Take 1 tablet (10 mg total) by mouth daily., Disp: 90 tablet, Rfl: 3 .  famotidine (PEPCID) 20 MG tablet, Take 1 tablet (20 mg total) by mouth 2 (two) times daily., Disp: 180 tablet, Rfl: 1 .  HYDROcodone-acetaminophen (NORCO/VICODIN) 5-325 MG tablet, Take 1 tablet by mouth every 6 (six) hours as needed for moderate pain., Disp: 30 tablet, Rfl: 0 .  levothyroxine (SYNTHROID) 112 MCG tablet, TAKE 1 TABLET BY MOUTH EVERY DAY, Disp: 90 tablet, Rfl: 0 .  Methoxy PEG-Epoetin Beta (MIRCERA IJ), Mircera, Disp: , Rfl:  .  Multiple Vitamin (MULTIVITAMIN) tablet, Take 1 tablet by mouth daily., Disp: , Rfl:  .  multivitamin (RENA-VIT) TABS tablet, Take 1 tablet by mouth daily., Disp: , Rfl:  .  nystatin cream (MYCOSTATIN), Apply 1 application topically 2 (two) times daily as needed for dry skin., Disp: 30 g, Rfl: 0 .  OXYGEN, Inhale 3 L into the lungs continuous., Disp: , Rfl:  .  pantoprazole (PROTONIX) 20 MG tablet, Take 1 tablet (20 mg total) by mouth daily., Disp: 90 tablet, Rfl: 1 .  pramipexole (MIRAPEX) 0.125  MG tablet, Take 1 tablet (0.125 mg total) by mouth at bedtime., Disp: 90 tablet, Rfl: 1 .  Vitamin D, Ergocalciferol, (DRISDOL) 1.25 MG (50000 UNIT) CAPS capsule, Take 1 capsule (50,000 Units total) by mouth every Monday., Disp: 12 capsule, Rfl: 3  Allergies  Allergen Reactions  . Amoxicillin Rash    Has patient had a PCN reaction causing immediate rash, facial/tongue/throat swelling, SOB or lightheadedness with hypotension:YES Has patient had a PCN reaction causing severe rash involving mucus membranes or skin necrosis: Yes Has patient had a PCN  reaction that required hospitalization No Has patient had a PCN reaction occurring within the last 10 years: Yes If all of the above answers are "NO", then may proceed with Cephalosporin use.   . Elemental Sulfur Hives  . Sulfur Hives  . Peg 3350-Electrolytes Other (See Comments)  . Sulfa Drugs Cross Reactors Hives and Itching  . Percocet [Oxycodone-Acetaminophen] Itching and Rash    Did not happen last time she took it  . Sulfa Antibiotics Hives   Past Medical History:  Diagnosis Date  . Acute respiratory failure ([place]) 05/2017  . Anemia   . Anxiety   . Arthritis   . COPD (chronic obstructive pulmonary disease) ([place])   . Depression   . ESRD (end stage renal disease) ([place])    dialysis MWF  . GERD (gastroesophageal reflux disease)   . Gout   . History of blood transfusion   . History of diabetes mellitus    "diet controlled"  . HLD (hyperlipidemia)   . HOH (hard of hearing)    left ear  . Hypertension    hypotensive -since starting dialysis  . Hypothyroidism   . MRSA (methicillin resistant staph aureus) culture positive 06/01/2017  . PAF (paroxysmal atrial fibrillation) ([place])    a. Echo 11/16:  Mild LVH, EF 55-60%, normal wall motion, MAC, mild MR, severe LAE (49 ml/m2), mild RVE, normal RVSF, mild RAE, mild TR, PASP 24 mmHg;  CHADS2-VASc: 4 >> Coumadin followed by PCP    Observations/Objective: Physical Exam Constitutional:      General: She is not in acute distress.    Appearance: Normal appearance. She is not ill-appearing or toxic-appearing.  Eyes:     General: No scleral icterus.       Right eye: No discharge.        Left eye: No discharge.     Conjunctiva/sclera: Conjunctivae normal.  Pulmonary:     Effort: Pulmonary effort is normal. No respiratory distress.  Neurological:     Mental Status: She is alert and oriented to person, place, and time.  Psychiatric:        Mood and Affect: Mood normal.        Behavior: Behavior normal.        Thought Content:  Thought content normal.        Judgment: Judgment normal.    Assessment and Plan: 1. Right sided weakness - Ambulatory referral to Home Health   Follow Up Instructions:   I discussed the assessment and treatment plan with the patient. The patient was provided an opportunity to ask questions and all were answered. The patient agreed with the plan and demonstrated an understanding of the instructions.   The patient was advised to call back or seek an in-person evaluation if the symptoms worsen or if the condition fails to improve as anticipated.  The above assessment and management plan was discussed with the patient. The patient verbalized understanding of and has agreed to the  management plan. Patient is aware to call the clinic if symptoms persist or worsen. Patient is aware when to return to the clinic for a follow-up visit. Patient educated on when it is appropriate to go to the emergency department.   Time call ended: 10:06 AM  I provided 11 minutes of face-to-face time during this encounter.   [name], MSN, APRN, FNP-C [facility] 06/25/20

## 2020-06-26 DIAGNOSIS — N186 End stage renal disease: Secondary | ICD-10-CM | POA: Diagnosis not present

## 2020-06-26 DIAGNOSIS — Z4901 Encounter for fitting and adjustment of extracorporeal dialysis catheter: Secondary | ICD-10-CM | POA: Diagnosis not present

## 2020-06-26 DIAGNOSIS — N2581 Secondary hyperparathyroidism of renal origin: Secondary | ICD-10-CM | POA: Diagnosis not present

## 2020-06-26 DIAGNOSIS — Z992 Dependence on renal dialysis: Secondary | ICD-10-CM | POA: Diagnosis not present

## 2020-06-26 DIAGNOSIS — R519 Headache, unspecified: Secondary | ICD-10-CM | POA: Diagnosis not present

## 2020-06-26 DIAGNOSIS — D689 Coagulation defect, unspecified: Secondary | ICD-10-CM | POA: Diagnosis not present

## 2020-06-27 DIAGNOSIS — I480 Paroxysmal atrial fibrillation: Secondary | ICD-10-CM | POA: Diagnosis not present

## 2020-06-29 DIAGNOSIS — D689 Coagulation defect, unspecified: Secondary | ICD-10-CM | POA: Diagnosis not present

## 2020-06-29 DIAGNOSIS — N186 End stage renal disease: Secondary | ICD-10-CM | POA: Diagnosis not present

## 2020-06-29 DIAGNOSIS — Z4901 Encounter for fitting and adjustment of extracorporeal dialysis catheter: Secondary | ICD-10-CM | POA: Diagnosis not present

## 2020-06-29 DIAGNOSIS — Z992 Dependence on renal dialysis: Secondary | ICD-10-CM | POA: Diagnosis not present

## 2020-06-29 DIAGNOSIS — D631 Anemia in chronic kidney disease: Secondary | ICD-10-CM | POA: Diagnosis not present

## 2020-06-29 DIAGNOSIS — N2581 Secondary hyperparathyroidism of renal origin: Secondary | ICD-10-CM | POA: Diagnosis not present

## 2020-07-01 DIAGNOSIS — R531 Weakness: Secondary | ICD-10-CM | POA: Diagnosis not present

## 2020-07-01 DIAGNOSIS — K5732 Diverticulitis of large intestine without perforation or abscess without bleeding: Secondary | ICD-10-CM | POA: Diagnosis not present

## 2020-07-01 DIAGNOSIS — Z049 Encounter for examination and observation for unspecified reason: Secondary | ICD-10-CM | POA: Diagnosis not present

## 2020-07-01 DIAGNOSIS — Z885 Allergy status to narcotic agent status: Secondary | ICD-10-CM | POA: Diagnosis not present

## 2020-07-01 DIAGNOSIS — Z79899 Other long term (current) drug therapy: Secondary | ICD-10-CM | POA: Diagnosis not present

## 2020-07-01 DIAGNOSIS — K922 Gastrointestinal hemorrhage, unspecified: Secondary | ICD-10-CM | POA: Diagnosis not present

## 2020-07-01 DIAGNOSIS — J449 Chronic obstructive pulmonary disease, unspecified: Secondary | ICD-10-CM | POA: Diagnosis not present

## 2020-07-01 DIAGNOSIS — N186 End stage renal disease: Secondary | ICD-10-CM | POA: Diagnosis not present

## 2020-07-01 DIAGNOSIS — I10 Essential (primary) hypertension: Secondary | ICD-10-CM | POA: Diagnosis not present

## 2020-07-01 DIAGNOSIS — Z87891 Personal history of nicotine dependence: Secondary | ICD-10-CM | POA: Diagnosis not present

## 2020-07-01 DIAGNOSIS — D689 Coagulation defect, unspecified: Secondary | ICD-10-CM | POA: Diagnosis not present

## 2020-07-01 DIAGNOSIS — Z882 Allergy status to sulfonamides status: Secondary | ICD-10-CM | POA: Diagnosis not present

## 2020-07-01 DIAGNOSIS — Z4901 Encounter for fitting and adjustment of extracorporeal dialysis catheter: Secondary | ICD-10-CM | POA: Diagnosis not present

## 2020-07-01 DIAGNOSIS — N200 Calculus of kidney: Secondary | ICD-10-CM | POA: Diagnosis not present

## 2020-07-01 DIAGNOSIS — Z992 Dependence on renal dialysis: Secondary | ICD-10-CM | POA: Diagnosis not present

## 2020-07-01 DIAGNOSIS — L539 Erythematous condition, unspecified: Secondary | ICD-10-CM | POA: Diagnosis not present

## 2020-07-01 DIAGNOSIS — E079 Disorder of thyroid, unspecified: Secondary | ICD-10-CM | POA: Diagnosis not present

## 2020-07-01 DIAGNOSIS — K5730 Diverticulosis of large intestine without perforation or abscess without bleeding: Secondary | ICD-10-CM | POA: Diagnosis not present

## 2020-07-01 DIAGNOSIS — K921 Melena: Secondary | ICD-10-CM | POA: Diagnosis not present

## 2020-07-01 DIAGNOSIS — E119 Type 2 diabetes mellitus without complications: Secondary | ICD-10-CM | POA: Diagnosis not present

## 2020-07-01 DIAGNOSIS — N2581 Secondary hyperparathyroidism of renal origin: Secondary | ICD-10-CM | POA: Diagnosis not present

## 2020-07-01 DIAGNOSIS — K449 Diaphragmatic hernia without obstruction or gangrene: Secondary | ICD-10-CM | POA: Diagnosis not present

## 2020-07-01 DIAGNOSIS — Z888 Allergy status to other drugs, medicaments and biological substances status: Secondary | ICD-10-CM | POA: Diagnosis not present

## 2020-07-01 DIAGNOSIS — K625 Hemorrhage of anus and rectum: Secondary | ICD-10-CM | POA: Diagnosis not present

## 2020-07-01 DIAGNOSIS — K429 Umbilical hernia without obstruction or gangrene: Secondary | ICD-10-CM | POA: Diagnosis not present

## 2020-07-01 DIAGNOSIS — R1084 Generalized abdominal pain: Secondary | ICD-10-CM | POA: Diagnosis not present

## 2020-07-01 DIAGNOSIS — D631 Anemia in chronic kidney disease: Secondary | ICD-10-CM | POA: Diagnosis not present

## 2020-07-02 DIAGNOSIS — N186 End stage renal disease: Secondary | ICD-10-CM | POA: Diagnosis not present

## 2020-07-02 DIAGNOSIS — Z992 Dependence on renal dialysis: Secondary | ICD-10-CM | POA: Diagnosis not present

## 2020-07-02 DIAGNOSIS — E1122 Type 2 diabetes mellitus with diabetic chronic kidney disease: Secondary | ICD-10-CM | POA: Diagnosis not present

## 2020-07-03 DIAGNOSIS — Z992 Dependence on renal dialysis: Secondary | ICD-10-CM | POA: Diagnosis not present

## 2020-07-03 DIAGNOSIS — Z4901 Encounter for fitting and adjustment of extracorporeal dialysis catheter: Secondary | ICD-10-CM | POA: Diagnosis not present

## 2020-07-03 DIAGNOSIS — N186 End stage renal disease: Secondary | ICD-10-CM | POA: Diagnosis not present

## 2020-07-03 DIAGNOSIS — N2581 Secondary hyperparathyroidism of renal origin: Secondary | ICD-10-CM | POA: Diagnosis not present

## 2020-07-03 DIAGNOSIS — D689 Coagulation defect, unspecified: Secondary | ICD-10-CM | POA: Diagnosis not present

## 2020-07-06 ENCOUNTER — Ambulatory Visit (INDEPENDENT_AMBULATORY_CARE_PROVIDER_SITE_OTHER): Payer: Medicare Other | Admitting: Nurse Practitioner

## 2020-07-06 ENCOUNTER — Other Ambulatory Visit: Payer: Self-pay

## 2020-07-06 VITALS — BP 136/83 | HR 97 | Temp 97.4°F

## 2020-07-06 DIAGNOSIS — Z992 Dependence on renal dialysis: Secondary | ICD-10-CM | POA: Diagnosis not present

## 2020-07-06 DIAGNOSIS — D631 Anemia in chronic kidney disease: Secondary | ICD-10-CM | POA: Diagnosis not present

## 2020-07-06 DIAGNOSIS — N186 End stage renal disease: Secondary | ICD-10-CM

## 2020-07-06 DIAGNOSIS — Z4901 Encounter for fitting and adjustment of extracorporeal dialysis catheter: Secondary | ICD-10-CM | POA: Diagnosis not present

## 2020-07-06 DIAGNOSIS — R195 Other fecal abnormalities: Secondary | ICD-10-CM | POA: Diagnosis not present

## 2020-07-06 DIAGNOSIS — R519 Headache, unspecified: Secondary | ICD-10-CM | POA: Diagnosis not present

## 2020-07-06 DIAGNOSIS — N2581 Secondary hyperparathyroidism of renal origin: Secondary | ICD-10-CM | POA: Diagnosis not present

## 2020-07-06 DIAGNOSIS — D689 Coagulation defect, unspecified: Secondary | ICD-10-CM | POA: Diagnosis not present

## 2020-07-06 LAB — HEMOGLOBIN, FINGERSTICK: Hemoglobin: 8 g/dL — ABNORMAL LOW (ref 11.1–15.9)

## 2020-07-06 NOTE — Progress Notes (Signed)
Established Patient Office Visit  Subjective:  Patient ID: [name], female    DOB: 1941/02/21  Age: 80 y.o. MRN: [id number]  CC:  Chief Complaint  Patient presents with  . Anemia    HPI [name] presents for Anemia: Patient  Presents for evaluation of anemia. Anemia was found by routine CBC.  It has been present for 5 years.  Associated signs & symptoms: dizziness/lightheadedness and fatigue. Patient as a history of blood transfusion in the past and is a dialysis patient.   Past Medical History:  Diagnosis Date  . Acute respiratory failure ([place]) 05/2017  . Anemia   . Anxiety   . Arthritis   . COPD (chronic obstructive pulmonary disease) ([place])   . Depression   . ESRD (end stage renal disease) ([place])    dialysis MWF  . GERD (gastroesophageal reflux disease)   . Gout   . History of blood transfusion   . History of diabetes mellitus    "diet controlled"  . HLD (hyperlipidemia)   . HOH (hard of hearing)    left ear  . Hypertension    hypotensive -since starting dialysis  . Hypothyroidism   . MRSA (methicillin resistant staph aureus) culture positive 06/01/2017  . PAF (paroxysmal atrial fibrillation) ([place])    a. Echo 11/16:  Mild LVH, EF 55-60%, normal wall motion, MAC, mild MR, severe LAE (49 ml/m2), mild RVE, normal RVSF, mild RAE, mild TR, PASP 24 mmHg;  CHADS2-VASc: 4 >> Coumadin followed by PCP    Past Surgical History:  Procedure Laterality Date  . AV FISTULA PLACEMENT  04/03/2012   Procedure: ARTERIOVENOUS (AV) FISTULA CREATION;  Surgeon: Elam Dutch, MD;  Location: [facility];  Service: Vascular;  Laterality: Left;  creation left brachial cephalic fistula   . BIOPSY  08/21/2018   Procedure: BIOPSY;  Surgeon: Thornton Park, MD;  Location: [facility];  Service: Gastroenterology;;  . CARPAL TUNNEL RELEASE Bilateral   . COLONOSCOPY W/ POLYPECTOMY    . COLONOSCOPY WITH PROPOFOL N/A 08/21/2018   Procedure: COLONOSCOPY WITH PROPOFOL;  Surgeon: Thornton Park, MD;  Location: [facility];  Service: Gastroenterology;  Laterality: N/A;  . DILATION AND CURETTAGE OF UTERUS    . ESOPHAGOGASTRODUODENOSCOPY (EGD) WITH PROPOFOL N/A 08/21/2018   Procedure: ESOPHAGOGASTRODUODENOSCOPY (EGD) WITH PROPOFOL;  Surgeon: Thornton Park, MD;  Location: [facility];  Service: Gastroenterology;  Laterality: N/A;  . EYE SURGERY Bilateral    bilateral cataract removal  . HEMATOMA EVACUATION Left 05/14/2018   Procedure: Incision and Drainage of Left Arm Hematoma;  Surgeon: Elam Dutch, MD;  Location: [facility] OR;  Service: Vascular;  Laterality: Left;  . Hemodialysis  catheter Right   . IR FLUORO GUIDE CV LINE RIGHT  05/26/2017  . IR REMOVAL TUN CV CATH W/O FL  05/24/2017  . IR US GUIDE VASC ACCESS RIGHT  05/26/2017  . LIGATION OF ARTERIOVENOUS  FISTULA Left 02/04/2015   Procedure: LIGATION OF BRACHIOCEPHALIC ARTERIOVENOUS  FISTULA;  Surgeon: Conrad Argenta, MD;  Location: [facility];  Service: Vascular;  Laterality: Left;  Marland Kitchen MULTIPLE TOOTH EXTRACTIONS    . PARTIAL HIP ARTHROPLASTY    . POLYPECTOMY  08/21/2018   Procedure: POLYPECTOMY;  Surgeon: Thornton Park, MD;  Location: [place];  Service: Gastroenterology;;  . PORTACATH PLACEMENT    . REVERSE SHOULDER ARTHROPLASTY Left 01/22/2016   Procedure: LEFT REVERSE SHOULDER ARTHROPLASTY;  Surgeon: Netta Cedars, MD;  Location: [facility];  Service: Orthopedics;  Laterality: Left;  . SHOULDER ARTHROSCOPY Bilateral   .  SPLIT NIGHT STUDY  07/26/2015  . STERIOD INJECTION Right 01/22/2016   Procedure: RIGHT RING FINGER STEROID INJECTION;  Surgeon: Netta Cedars, MD;  Location: [facility];  Service: Orthopedics;  Laterality: Right;  . TEE WITHOUT CARDIOVERSION N/A 05/26/2017   Procedure: TRANSESOPHAGEAL ECHOCARDIOGRAM (TEE);  Surgeon: Lelon Perla, MD;  Location: [place] [facility];  Service: Cardiovascular;  Laterality: N/A;  . THROMBECTOMY BRACHIAL ARTERY Left 02/06/2015   Procedure: EVACUATION OF LEFT ARM HEMATOMA;  Surgeon:  Angelia Mould, MD;  Location: [facility];  Service: Vascular;  Laterality: Left;  . TOTAL KNEE ARTHROPLASTY Bilateral   . TUBAL LIGATION      Family History  Problem Relation Age of Onset  . Arthritis Mother   . Diabetes Father        before age 64  . Heart disease Father   . Diabetes Sister   . Cancer Brother   . Hyperlipidemia Daughter   . Hypertension Daughter   . Diabetes Daughter   . Hypertension Son   . Hyperlipidemia Son   . Dementia Sister   . Cancer Sister        Unknown  . Other Sister        Died in car accident  . Stroke Brother   . Diabetes Daughter   . Hypertension Daughter   . Hyperlipidemia Daughter   . Hepatitis C Son     Social History   Socioeconomic History  . Marital status: Widowed    Spouse name: Not on file  . Number of children: 6  . Years of education: 70  . Highest education level: 10th grade  Occupational History  . Occupation: RETIRED  Tobacco Use  . Smoking status: Former Smoker    Packs/day: 0.25    Years: 2.00    Pack years: 0.50    Types: Cigarettes    Quit date: 04/04/1998    Years since quitting: 22.2  . Smokeless tobacco: Never Used  Vaping Use  . Vaping Use: Never used  Substance and Sexual Activity  . Alcohol use: No    Alcohol/week: 0.0 standard drinks  . Drug use: No  . Sexual activity: Not Currently    Birth control/protection: None  Other Topics Concern  . Not on file  Social History Narrative   ** Merged History Encounter **       Widowed 6 children Lives with son and daughter in law homemaker   Social Determinants of Health   Financial Resource Strain: Not on file  Food Insecurity: Not on file  Transportation Needs: Not on file  Physical Activity: Not on file  Stress: Not on file  Social Connections: Not on file  Intimate Partner Violence: Not on file    Outpatient Medications Prior to Visit  Medication Sig Dispense Refill  . acetaminophen (TYLENOL) 500 MG tablet Take 2 tablets (1,000 mg  total) by mouth every 8 (eight) hours as needed for mild pain or headache. 30 tablet 0  . albuterol (VENTOLIN HFA) 108 (90 Base) MCG/ACT inhaler Inhale 2 puffs into the lungs every 6 (six) hours as needed for wheezing or shortness of breath. 18 g 2  . alendronate (FOSAMAX) 70 MG tablet Take 1 tablet (70 mg total) by mouth every Thursday. 12 tablet 3  . aspirin EC 81 MG tablet Take 81 mg by mouth daily.    . budesonide-formoterol (SYMBICORT) 80-4.5 MCG/ACT inhaler Inhale 2 puffs into the lungs 2 (two) times daily. 3 each 4  . cinacalcet (SENSIPAR) 30 MG tablet  Take 1 tablet (30 mg total) by mouth every Monday, Wednesday, and Friday. 60 tablet 0  . ciprofloxacin (CIPRO) 500 MG tablet Take by mouth.    . docusate calcium (SURFAK) 240 MG capsule Take 1 capsule (240 mg total) by mouth daily. 90 capsule 1  . doxercalciferol (HECTOROL) 0.5 MCG capsule Doxercalciferol (Hectorol)    . doxercalciferol (HECTOROL) 4 MCG/2ML injection Inject 0.5 mLs (1 mcg total) into the vein every Monday, Wednesday, and Friday with hemodialysis. 2 mL 0  . ezetimibe (ZETIA) 10 MG tablet Take 1 tablet (10 mg total) by mouth daily. 90 tablet 3  . famotidine (PEPCID) 20 MG tablet Take 1 tablet (20 mg total) by mouth 2 (two) times daily. 180 tablet 1  . HYDROcodone-acetaminophen (NORCO/VICODIN) 5-325 MG tablet Take 1 tablet by mouth every 6 (six) hours as needed for moderate pain. 30 tablet 0  . levothyroxine (SYNTHROID) 112 MCG tablet TAKE 1 TABLET BY MOUTH EVERY DAY 90 tablet 0  . Methoxy PEG-Epoetin Beta (MIRCERA IJ) Mircera    . metroNIDAZOLE (FLAGYL) 500 MG tablet Take by mouth.    . Multiple Vitamin (MULTIVITAMIN) tablet Take 1 tablet by mouth daily.    . multivitamin (RENA-VIT) TABS tablet Take 1 tablet by mouth daily.    Marland Kitchen nystatin cream (MYCOSTATIN) Apply 1 application topically 2 (two) times daily as needed for dry skin. 30 g 0  . OXYGEN Inhale 3 L into the lungs continuous.    . pantoprazole (PROTONIX) 20 MG tablet  Take 1 tablet (20 mg total) by mouth daily. 90 tablet 1  . pramipexole (MIRAPEX) 0.125 MG tablet Take 1 tablet (0.125 mg total) by mouth at bedtime. 90 tablet 1  . Vitamin D, Ergocalciferol, (DRISDOL) 1.25 MG (50000 UNIT) CAPS capsule Take 1 capsule (50,000 Units total) by mouth every Monday. 12 capsule 3   No facility-administered medications prior to visit.    Allergies  Allergen Reactions  . Amoxicillin Rash    Has patient had a PCN reaction causing immediate rash, facial/tongue/throat swelling, SOB or lightheadedness with hypotension:YES Has patient had a PCN reaction causing severe rash involving mucus membranes or skin necrosis: Yes Has patient had a PCN reaction that required hospitalization No Has patient had a PCN reaction occurring within the last 10 years: Yes If all of the above answers are "NO", then may proceed with Cephalosporin use.   . Elemental Sulfur Hives  . Sulfur Hives  . Peg 3350-Electrolytes Other (See Comments)  . Sulfa Drugs Cross Reactors Hives and Itching  . Percocet [Oxycodone-Acetaminophen] Itching and Rash    Did not happen last time she took it  . Sulfa Antibiotics Hives    ROS Review of Systems  Constitutional: Positive for fatigue. Negative for chills and fever.  HENT: Negative.   Eyes: Negative.   Respiratory: Negative.   Cardiovascular: Negative.   Genitourinary: Negative.   Musculoskeletal: Negative.   Neurological: Negative.   Psychiatric/Behavioral: Negative.   All other systems reviewed and are negative.     Objective:    Physical Exam Vitals reviewed. Exam conducted with a chaperone present.  Constitutional:      General: She is awake.     Appearance: Normal appearance.     Interventions: Face mask in place.  HENT:     Head: Normocephalic.     Nose: Nose normal.  Eyes:     Conjunctiva/sclera: Conjunctivae normal.  Cardiovascular:     Rate and Rhythm: Normal rate and regular rhythm.     Pulses:  Normal pulses.     Heart  sounds: Normal heart sounds.  Pulmonary:     Effort: Pulmonary effort is normal.     Breath sounds: Normal breath sounds.  Abdominal:     General: Bowel sounds are normal.  Skin:    General: Skin is warm.  Neurological:     Mental Status: She is oriented to person, place, and time. She is lethargic.  Psychiatric:        Behavior: Behavior normal. Behavior is cooperative.     BP 136/83   Pulse 97   Temp (!) 97.4 F (36.3 C) (Temporal)   SpO2 98%  Wt Readings from Last 3 Encounters:  05/29/20 144 lb (65.3 kg)  05/19/20 144 lb (65.3 kg)  03/10/20 154 lb (69.9 kg)     Health Maintenance Due  Topic Date Due  . FOOT EXAM  Never done  . HEMOGLOBIN A1C  11/22/2017  . OPHTHALMOLOGY EXAM  01/08/2020      Lab Results  Component Value Date   TSH 7.700 (H) 05/19/2020   Lab Results  Component Value Date   WBC 10.9 (H) 05/19/2020   HGB 10.4 (L) 05/19/2020   HCT 31.6 (L) 05/19/2020   MCV 93 05/19/2020   PLT 499 (H) 05/19/2020   Lab Results  Component Value Date   NA 145 (H) 05/19/2020   K 4.5 05/19/2020   CO2 19 (L) 05/19/2020   GLUCOSE 111 (H) 05/19/2020   BUN 31 (H) 05/19/2020   CREATININE 5.20 (HH) 05/19/2020   BILITOT <0.2 05/19/2020   ALKPHOS 120 05/19/2020   AST 21 05/19/2020   ALT 11 05/19/2020   PROT 6.0 05/19/2020   ALBUMIN 3.4 (L) 05/19/2020   CALCIUM 8.6 (L) 05/19/2020   ANIONGAP 16 (H) 12/11/2019   Lab Results  Component Value Date   CHOL 149 05/19/2020   Lab Results  Component Value Date   HDL 77 05/19/2020   Lab Results  Component Value Date   LDLCALC 59 05/19/2020   Lab Results  Component Value Date   TRIG 66 05/19/2020   Lab Results  Component Value Date   CHOLHDL 1.9 05/19/2020   Lab Results  Component Value Date   HGBA1C 4.5 (L) 05/25/2017      Assessment & Plan:   Problem List Items Addressed This Visit      Other   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 patient has been given a stool card and will follow up with STAT hemoglobin check on Wednesday.        Relevant Orders   Hemoglobin, fingerstick (Completed)   Fecal Occult Blood, Guaiac([place])       Follow-up: Return in about 1 week (around 07/13/2020), or if symptoms worsen or fail to improve.    Ivy Lynn, NP

## 2020-07-06 NOTE — Patient Instructions (Signed)
[employer] medicine (25th ed., pp. [phone]). [place], [place]: Elsevier.">  Anemia  Anemia is a condition in which there is not enough red blood cells or hemoglobin in the blood. Hemoglobin is a substance in red blood cells that carries oxygen. When you do not have enough red blood cells or hemoglobin (are anemic), your body cannot get enough oxygen and your organs may not work properly. As a result, you may feel very tired or have other problems. What are the causes? Common causes of anemia include:  Excessive bleeding. Anemia can be caused by excessive bleeding inside or outside the body, including bleeding from the intestines or from heavy menstrual periods in females.  Poor nutrition.  Long-lasting (chronic) kidney, thyroid, and liver disease.  Bone marrow disorders, spleen problems, and blood disorders.  Cancer and treatments for cancer.  HIV (human immunodeficiency virus) and AIDS (acquired immunodeficiency syndrome).  Infections, medicines, and autoimmune disorders that destroy red blood cells. What are the signs or symptoms? Symptoms of this condition include:  Minor weakness.  Dizziness.  Headache, or difficulties concentrating and sleeping.  Heartbeats that feel irregular or faster than normal (palpitations).  Shortness of breath, especially with exercise.  Pale skin, lips, and nails, or cold hands and feet.  Indigestion and nausea. Symptoms may occur suddenly or develop slowly. If your anemia is mild, you may not have symptoms. How is this diagnosed? This condition is diagnosed based on blood tests, your medical history, and a physical exam. In some cases, a test may be needed in which cells are removed from the soft tissue inside of a bone and looked at under a microscope (bone marrow biopsy). Your health care provider may also check your stool (feces) for blood and may do additional testing to look for the cause of your bleeding. Other tests may  include:  Imaging tests, such as a CT scan or MRI.  A procedure to see inside your esophagus and stomach (endoscopy).  A procedure to see inside your colon and rectum (colonoscopy). How is this treated? Treatment for this condition depends on the cause. If you continue to lose a lot of blood, you may need to be treated at a hospital. Treatment may include:  Taking supplements of iron, vitamin Q68, or folic acid.  Taking a hormone medicine (erythropoietin) that can help to stimulate red blood cell growth.  Having a blood transfusion. This may be needed if you lose a lot of blood.  Making changes to your diet.  Having surgery to remove your spleen. Follow these instructions at home:  Take over-the-counter and prescription medicines only as told by your health care provider.  Take supplements only as told by your health care provider.  Follow any diet instructions that you were given by your health care provider.  Keep all follow-up visits as told by your health care provider. This is important. Contact a health care provider if:  You develop new bleeding anywhere in the body. Get help right away if:  You are very weak.  You are short of breath.  You have pain in your abdomen or chest.  You are dizzy or feel faint.  You have trouble concentrating.  You have bloody stools, black stools, or tarry stools.  You vomit repeatedly or you vomit up blood. These symptoms may represent a serious problem that is an emergency. Do not wait to see if the symptoms will go away. Get medical help right away. Call your local emergency services (911 in the [place] Do not  drive yourself to the hospital. Summary  Anemia is a condition in which you do not have enough red blood cells or enough of a substance in your red blood cells that carries oxygen (hemoglobin).  Symptoms may occur suddenly or develop slowly.  If your anemia is mild, you may not have symptoms.  This condition is  diagnosed with blood tests, a medical history, and a physical exam. Other tests may be needed.  Treatment for this condition depends on the cause of the anemia. This information is not intended to replace advice given to you by your health care provider. Make sure you discuss any questions you have with your health care provider. Document Revised: 02/26/2019 Document Reviewed: 02/26/2019 Elsevier Patient Education  2021 [employer] Inc.  

## 2020-07-06 NOTE — Assessment & Plan Note (Signed)
Anemia not well controlled, hemoglobin 10 7 days ago and now 8.0 today in clinic, I contacted the dialysis center and was told blood transfusions are done only when hemoglobin is 7 and below. patient has been given a stool card and will follow up with STAT hemoglobin check on Wednesday.

## 2020-07-08 DIAGNOSIS — N2581 Secondary hyperparathyroidism of renal origin: Secondary | ICD-10-CM | POA: Diagnosis not present

## 2020-07-08 DIAGNOSIS — Z992 Dependence on renal dialysis: Secondary | ICD-10-CM | POA: Diagnosis not present

## 2020-07-08 DIAGNOSIS — Z4901 Encounter for fitting and adjustment of extracorporeal dialysis catheter: Secondary | ICD-10-CM | POA: Diagnosis not present

## 2020-07-08 DIAGNOSIS — R519 Headache, unspecified: Secondary | ICD-10-CM | POA: Diagnosis not present

## 2020-07-08 DIAGNOSIS — D631 Anemia in chronic kidney disease: Secondary | ICD-10-CM | POA: Diagnosis not present

## 2020-07-08 DIAGNOSIS — D689 Coagulation defect, unspecified: Secondary | ICD-10-CM | POA: Diagnosis not present

## 2020-07-08 DIAGNOSIS — N186 End stage renal disease: Secondary | ICD-10-CM | POA: Diagnosis not present

## 2020-07-08 NOTE — Addendum Note (Signed)
Addended by: Reather Converse on: 07/08/2020 10:59 AM   Modules accepted: Orders

## 2020-07-08 NOTE — Addendum Note (Signed)
Addended byCarrolyn Leigh on: 07/08/2020 10:55 AM   Modules accepted: Orders

## 2020-07-09 ENCOUNTER — Other Ambulatory Visit: Payer: Self-pay | Admitting: Nurse Practitioner

## 2020-07-09 DIAGNOSIS — R195 Other fecal abnormalities: Secondary | ICD-10-CM

## 2020-07-09 DIAGNOSIS — M25562 Pain in left knee: Secondary | ICD-10-CM | POA: Diagnosis not present

## 2020-07-09 LAB — FECAL OCCULT BLOOD, IMMUNOCHEMICAL: Fecal Occult Bld: POSITIVE — AB

## 2020-07-10 DIAGNOSIS — Z992 Dependence on renal dialysis: Secondary | ICD-10-CM | POA: Diagnosis not present

## 2020-07-10 DIAGNOSIS — Z4901 Encounter for fitting and adjustment of extracorporeal dialysis catheter: Secondary | ICD-10-CM | POA: Diagnosis not present

## 2020-07-10 DIAGNOSIS — D689 Coagulation defect, unspecified: Secondary | ICD-10-CM | POA: Diagnosis not present

## 2020-07-10 DIAGNOSIS — N186 End stage renal disease: Secondary | ICD-10-CM | POA: Diagnosis not present

## 2020-07-10 DIAGNOSIS — N2581 Secondary hyperparathyroidism of renal origin: Secondary | ICD-10-CM | POA: Diagnosis not present

## 2020-07-10 DIAGNOSIS — R519 Headache, unspecified: Secondary | ICD-10-CM | POA: Diagnosis not present

## 2020-07-10 NOTE — Addendum Note (Signed)
Addended by: Ivy Lynn on: 07/10/2020 11:25 AM   Modules accepted: Orders

## 2020-07-13 DIAGNOSIS — Z992 Dependence on renal dialysis: Secondary | ICD-10-CM | POA: Diagnosis not present

## 2020-07-13 DIAGNOSIS — N2581 Secondary hyperparathyroidism of renal origin: Secondary | ICD-10-CM | POA: Diagnosis not present

## 2020-07-13 DIAGNOSIS — Z4901 Encounter for fitting and adjustment of extracorporeal dialysis catheter: Secondary | ICD-10-CM | POA: Diagnosis not present

## 2020-07-13 DIAGNOSIS — N186 End stage renal disease: Secondary | ICD-10-CM | POA: Diagnosis not present

## 2020-07-13 DIAGNOSIS — D689 Coagulation defect, unspecified: Secondary | ICD-10-CM | POA: Diagnosis not present

## 2020-07-14 ENCOUNTER — Other Ambulatory Visit: Payer: Self-pay

## 2020-07-14 ENCOUNTER — Inpatient Hospital Stay (HOSPITAL_COMMUNITY): Payer: Medicare Other

## 2020-07-14 ENCOUNTER — Ambulatory Visit (INDEPENDENT_AMBULATORY_CARE_PROVIDER_SITE_OTHER): Payer: Medicare Other | Admitting: Physician Assistant

## 2020-07-14 ENCOUNTER — Other Ambulatory Visit (INDEPENDENT_AMBULATORY_CARE_PROVIDER_SITE_OTHER): Payer: Medicare Other

## 2020-07-14 ENCOUNTER — Inpatient Hospital Stay (HOSPITAL_COMMUNITY)
Admission: RE | Admit: 2020-07-14 | Discharge: 2020-07-17 | DRG: 377 | Disposition: A | Discharge status: Home Health | Payer: Medicare Other | Attending: Family Medicine | Admitting: Family Medicine

## 2020-07-14 ENCOUNTER — Encounter: Payer: Self-pay | Admitting: Physician Assistant

## 2020-07-14 ENCOUNTER — Encounter (HOSPITAL_COMMUNITY): Payer: Self-pay | Admitting: Internal Medicine

## 2020-07-14 VITALS — BP 110/64 | HR 66 | Ht 62.0 in | Wt 139.0 lb

## 2020-07-14 DIAGNOSIS — Z79899 Other long term (current) drug therapy: Secondary | ICD-10-CM

## 2020-07-14 DIAGNOSIS — Z881 Allergy status to other antibiotic agents status: Secondary | ICD-10-CM | POA: Diagnosis not present

## 2020-07-14 DIAGNOSIS — Z8719 Personal history of other diseases of the digestive system: Secondary | ICD-10-CM

## 2020-07-14 DIAGNOSIS — Z96653 Presence of artificial knee joint, bilateral: Secondary | ICD-10-CM | POA: Diagnosis not present

## 2020-07-14 DIAGNOSIS — Z96612 Presence of left artificial shoulder joint: Secondary | ICD-10-CM | POA: Diagnosis present

## 2020-07-14 DIAGNOSIS — R531 Weakness: Secondary | ICD-10-CM

## 2020-07-14 DIAGNOSIS — D631 Anemia in chronic kidney disease: Secondary | ICD-10-CM | POA: Diagnosis present

## 2020-07-14 DIAGNOSIS — Z882 Allergy status to sulfonamides status: Secondary | ICD-10-CM

## 2020-07-14 DIAGNOSIS — Z8249 Family history of ischemic heart disease and other diseases of the circulatory system: Secondary | ICD-10-CM

## 2020-07-14 DIAGNOSIS — N186 End stage renal disease: Secondary | ICD-10-CM | POA: Diagnosis present

## 2020-07-14 DIAGNOSIS — J449 Chronic obstructive pulmonary disease, unspecified: Secondary | ICD-10-CM | POA: Diagnosis present

## 2020-07-14 DIAGNOSIS — N2581 Secondary hyperparathyroidism of renal origin: Secondary | ICD-10-CM | POA: Diagnosis not present

## 2020-07-14 DIAGNOSIS — Z9981 Dependence on supplemental oxygen: Secondary | ICD-10-CM | POA: Diagnosis not present

## 2020-07-14 DIAGNOSIS — Z7951 Long term (current) use of inhaled steroids: Secondary | ICD-10-CM

## 2020-07-14 DIAGNOSIS — K921 Melena: Secondary | ICD-10-CM

## 2020-07-14 DIAGNOSIS — Z7983 Long term (current) use of bisphosphonates: Secondary | ICD-10-CM

## 2020-07-14 DIAGNOSIS — R0600 Dyspnea, unspecified: Secondary | ICD-10-CM

## 2020-07-14 DIAGNOSIS — E1122 Type 2 diabetes mellitus with diabetic chronic kidney disease: Secondary | ICD-10-CM | POA: Diagnosis present

## 2020-07-14 DIAGNOSIS — R0609 Other forms of dyspnea: Secondary | ICD-10-CM

## 2020-07-14 DIAGNOSIS — K219 Gastro-esophageal reflux disease without esophagitis: Secondary | ICD-10-CM | POA: Diagnosis present

## 2020-07-14 DIAGNOSIS — D62 Acute posthemorrhagic anemia: Secondary | ICD-10-CM | POA: Diagnosis not present

## 2020-07-14 DIAGNOSIS — K922 Gastrointestinal hemorrhage, unspecified: Principal | ICD-10-CM | POA: Diagnosis present

## 2020-07-14 DIAGNOSIS — H9192 Unspecified hearing loss, left ear: Secondary | ICD-10-CM | POA: Diagnosis not present

## 2020-07-14 DIAGNOSIS — Z66 Do not resuscitate: Secondary | ICD-10-CM | POA: Diagnosis present

## 2020-07-14 DIAGNOSIS — M109 Gout, unspecified: Secondary | ICD-10-CM | POA: Diagnosis not present

## 2020-07-14 DIAGNOSIS — Z20822 Contact with and (suspected) exposure to covid-19: Secondary | ICD-10-CM | POA: Diagnosis present

## 2020-07-14 DIAGNOSIS — E039 Hypothyroidism, unspecified: Secondary | ICD-10-CM | POA: Diagnosis present

## 2020-07-14 DIAGNOSIS — E785 Hyperlipidemia, unspecified: Secondary | ICD-10-CM | POA: Diagnosis present

## 2020-07-14 DIAGNOSIS — Z87891 Personal history of nicotine dependence: Secondary | ICD-10-CM | POA: Diagnosis not present

## 2020-07-14 DIAGNOSIS — I120 Hypertensive chronic kidney disease with stage 5 chronic kidney disease or end stage renal disease: Secondary | ICD-10-CM | POA: Diagnosis present

## 2020-07-14 DIAGNOSIS — D649 Anemia, unspecified: Secondary | ICD-10-CM | POA: Diagnosis not present

## 2020-07-14 DIAGNOSIS — K2100 Gastro-esophageal reflux disease with esophagitis, without bleeding: Secondary | ICD-10-CM | POA: Diagnosis present

## 2020-07-14 DIAGNOSIS — F419 Anxiety disorder, unspecified: Secondary | ICD-10-CM | POA: Diagnosis present

## 2020-07-14 DIAGNOSIS — Z888 Allergy status to other drugs, medicaments and biological substances status: Secondary | ICD-10-CM

## 2020-07-14 DIAGNOSIS — Z833 Family history of diabetes mellitus: Secondary | ICD-10-CM

## 2020-07-14 DIAGNOSIS — Z992 Dependence on renal dialysis: Secondary | ICD-10-CM

## 2020-07-14 DIAGNOSIS — Z79890 Hormone replacement therapy (postmenopausal): Secondary | ICD-10-CM

## 2020-07-14 DIAGNOSIS — R0602 Shortness of breath: Secondary | ICD-10-CM | POA: Diagnosis not present

## 2020-07-14 DIAGNOSIS — Z885 Allergy status to narcotic agent status: Secondary | ICD-10-CM

## 2020-07-14 DIAGNOSIS — K5791 Diverticulosis of intestine, part unspecified, without perforation or abscess with bleeding: Secondary | ICD-10-CM | POA: Diagnosis present

## 2020-07-14 DIAGNOSIS — Z7982 Long term (current) use of aspirin: Secondary | ICD-10-CM

## 2020-07-14 LAB — HEMOGLOBIN: Hemoglobin: 7 g/dL — CL (ref 12.0–15.0)

## 2020-07-14 MED ORDER — CINACALCET HCL 30 MG PO TABS
30.0000 mg | ORAL_TABLET | ORAL | Status: DC
  Administered 2020-07-15 – 2020-07-17 (×2): 30 mg via ORAL
  Filled 2020-07-14 (×2): qty 1

## 2020-07-14 MED ORDER — ONDANSETRON HCL 4 MG/2ML IJ SOLN: 4.0000 mg | Freq: Four times a day (QID) | INTRAMUSCULAR | Status: DC | PRN

## 2020-07-14 MED ORDER — ALBUTEROL SULFATE (2.5 MG/3ML) 0.083% IN NEBU: 2.5000 mg | INHALATION_SOLUTION | Freq: Four times a day (QID) | RESPIRATORY_TRACT | Status: DC | PRN

## 2020-07-14 MED ORDER — PANTOPRAZOLE SODIUM 40 MG IV SOLR
40.0000 mg | Freq: Two times a day (BID) | INTRAVENOUS | Status: DC
  Administered 2020-07-14: 40 mg via INTRAVENOUS
  Filled 2020-07-14: qty 40

## 2020-07-14 MED ORDER — LEVOTHYROXINE SODIUM 112 MCG PO TABS
112.0000 ug | ORAL_TABLET | Freq: Every day | ORAL | Status: DC
  Administered 2020-07-15 – 2020-07-17 (×3): 112 ug via ORAL
  Filled 2020-07-14 (×3): qty 1

## 2020-07-14 MED ORDER — ONDANSETRON HCL 4 MG PO TABS: 4.0000 mg | ORAL_TABLET | Freq: Four times a day (QID) | ORAL | Status: DC | PRN

## 2020-07-14 MED ORDER — ONE-DAILY MULTI VITAMINS PO TABS: 1.0000 | ORAL_TABLET | Freq: Every day | ORAL | Status: DC

## 2020-07-14 MED ORDER — EZETIMIBE 10 MG PO TABS
10.0000 mg | ORAL_TABLET | Freq: Every day | ORAL | Status: DC
  Administered 2020-07-15 – 2020-07-17 (×3): 10 mg via ORAL
  Filled 2020-07-14 (×3): qty 1

## 2020-07-14 MED ORDER — FAMOTIDINE 20 MG PO TABS
20.0000 mg | ORAL_TABLET | Freq: Every day | ORAL | Status: DC
  Administered 2020-07-15 – 2020-07-17 (×3): 20 mg via ORAL
  Filled 2020-07-14 (×3): qty 1

## 2020-07-14 MED ORDER — HYDROCODONE-ACETAMINOPHEN 5-325 MG PO TABS: 1.0000 | ORAL_TABLET | Freq: Four times a day (QID) | ORAL | Status: DC | PRN

## 2020-07-14 MED ORDER — ACETAMINOPHEN 650 MG RE SUPP: 650.0000 mg | Freq: Four times a day (QID) | RECTAL | Status: DC | PRN

## 2020-07-14 MED ORDER — RENA-VITE PO TABS
1.0000 | ORAL_TABLET | Freq: Every day | ORAL | Status: DC
  Administered 2020-07-15 – 2020-07-17 (×3): 1 via ORAL
  Filled 2020-07-14 (×3): qty 1

## 2020-07-14 MED ORDER — SODIUM CHLORIDE 0.9% IV SOLUTION: Freq: Once | INTRAVENOUS | Status: DC

## 2020-07-14 MED ORDER — DOXERCALCIFEROL 4 MCG/2ML IV SOLN
1.0000 ug | INTRAVENOUS | Status: DC
  Administered 2020-07-15 – 2020-07-17 (×2): 1 ug via INTRAVENOUS
  Filled 2020-07-14 (×2): qty 2

## 2020-07-14 MED ORDER — ACETAMINOPHEN 325 MG PO TABS
650.0000 mg | ORAL_TABLET | Freq: Four times a day (QID) | ORAL | Status: DC | PRN
  Administered 2020-07-15 – 2020-07-17 (×3): 650 mg via ORAL
  Filled 2020-07-14 (×3): qty 2

## 2020-07-14 MED ORDER — TECHNETIUM TC 99M-LABELED RED BLOOD CELLS IV KIT
26.2000 | PACK | Freq: Once | INTRAVENOUS | Status: AC | PRN
  Administered 2020-07-14: 26.2 via INTRAVENOUS

## 2020-07-14 MED ORDER — FLUTICASONE FUROATE-VILANTEROL 100-25 MCG/INH IN AEPB
1.0000 | INHALATION_SPRAY | Freq: Every day | RESPIRATORY_TRACT | Status: DC
  Administered 2020-07-15 – 2020-07-17 (×3): 1 via RESPIRATORY_TRACT
  Filled 2020-07-14: qty 28

## 2020-07-14 NOTE — H&P (Signed)
History and Physical    [name] BSJ:[id number] DOB: [birth year] DOA: 07/14/2020  PCP: [name], FNP   Patient coming from: GI office, lives at home  I have personally briefly reviewed patient's old medical records in [place]  Chief Complaint: Bright red blood per rectum.  HPI: [name] is a 80 y.o. female with medical history significant of COPD, on oxygen at night, ESRD,  MW F, A. Fib-on aspirin for that, iron deficiency anemia was sent to hospital as a direct admit from her gastroenterologist office where she was presented with bright red blood per rectum going on for about 2 weeks.  Patient had similar symptoms intermittently for the past 2 years and had EGDs and colonoscopies. Patient was complaining of worsening weakness.  Denies any fever or chills.  Denies any abdominal pain.  No chest pain, do have some exertional dyspnea which is seems chronic, no orthopnea or PND.  No urinary symptoms.  She was seen by her PCP recently and her hemoglobin was 8, Hemoccult test was positive and they advised transfusion but her dialysis center refused transfusion unless hemoglobin is below 7.  Today at GI office her hemoglobin was 7.  Baseline around 10. 1 unit of PRBC ordered. GI is recommending CTA.  ED Course: Hemodynamically stable, labs done at GI office significant for hemoglobin of 7.  Review of Systems: As per HPI otherwise 10 point review of systems negative.   Past Medical History:  Diagnosis Date  . Acute respiratory failure ([place]) 05/2017  . Anemia   . Anxiety   . Arthritis   . COPD (chronic obstructive pulmonary disease) ([place])   . Depression   . ESRD (end stage renal disease) ([place])    dialysis MWF  . GERD (gastroesophageal reflux disease)   . Gout   . History of blood transfusion   . History of diabetes mellitus    "diet controlled"  . HLD (hyperlipidemia)   . HOH (hard of hearing)    left ear  . Hypertension    hypotensive -since starting dialysis   . Hypothyroidism   . MRSA (methicillin resistant staph aureus) culture positive 06/01/2017  . PAF (paroxysmal atrial fibrillation) ([place])    a. Echo 11/16:  Mild LVH, EF 55-60%, normal wall motion, MAC, mild MR, severe LAE (49 ml/m2), mild RVE, normal RVSF, mild RAE, mild TR, PASP 24 mmHg;  CHADS2-VASc: 4 >> Coumadin followed by PCP    Past Surgical History:  Procedure Laterality Date  . AV FISTULA PLACEMENT  04/03/2012   Procedure: ARTERIOVENOUS (AV) FISTULA CREATION;  Surgeon: Elam Dutch, MD;  Location: [facility];  Service: Vascular;  Laterality: Left;  creation left brachial cephalic fistula   . BIOPSY  08/21/2018   Procedure: BIOPSY;  Surgeon: Thornton Park, MD;  Location: [facility];  Service: Gastroenterology;;  . CARPAL TUNNEL RELEASE Bilateral   . COLONOSCOPY W/ POLYPECTOMY    . COLONOSCOPY WITH PROPOFOL N/A 08/21/2018   Procedure: COLONOSCOPY WITH PROPOFOL;  Surgeon: Thornton Park, MD;  Location: [facility];  Service: Gastroenterology;  Laterality: N/A;  . DILATION AND CURETTAGE OF UTERUS    . ESOPHAGOGASTRODUODENOSCOPY (EGD) WITH PROPOFOL N/A 08/21/2018   Procedure: ESOPHAGOGASTRODUODENOSCOPY (EGD) WITH PROPOFOL;  Surgeon: Thornton Park, MD;  Location: [facility];  Service: Gastroenterology;  Laterality: N/A;  . EYE SURGERY Bilateral    bilateral cataract removal  . HEMATOMA EVACUATION Left 05/14/2018   Procedure: Incision and Drainage of Left Arm Hematoma;  Surgeon: Elam Dutch,  MD;  Location[facility];  Service: Vascular;  Laterality: Left;  . Hemodialysis  catheter Right   . IR FLUORO GUIDE CV LINE RIGHT  05/26/2017  . IR REMOVAL TUN CV CATH W/O FL  05/24/2017  . IR US GUIDE VASC ACCESS RIGHT  05/26/2017  . LIGATION OF ARTERIOVENOUS  FISTULA Left 02/04/2015   Procedure: LIGATION OF BRACHIOCEPHALIC ARTERIOVENOUS  FISTULA;  Surgeon: Conrad Independence, MD;  Location: [facility];  Service: Vascular;  Laterality: Left;  Marland Kitchen MULTIPLE TOOTH EXTRACTIONS    . PARTIAL HIP  ARTHROPLASTY    . POLYPECTOMY  08/21/2018   Procedure: POLYPECTOMY;  Surgeon: Thornton Park, MD;  Location: [facility];  Service: Gastroenterology;;  . PORTACATH PLACEMENT    . REVERSE SHOULDER ARTHROPLASTY Left 01/22/2016   Procedure: LEFT REVERSE SHOULDER ARTHROPLASTY;  Surgeon: Netta Cedars, MD;  Location: [facility];  Service: Orthopedics;  Laterality: Left;  . SHOULDER ARTHROSCOPY Bilateral   . SPLIT NIGHT STUDY  07/26/2015  . STERIOD INJECTION Right 01/22/2016   Procedure: RIGHT RING FINGER STEROID INJECTION;  Surgeon: Netta Cedars, MD;  Location: [facility];  Service: Orthopedics;  Laterality: Right;  . TEE WITHOUT CARDIOVERSION N/A 05/26/2017   Procedure: TRANSESOPHAGEAL ECHOCARDIOGRAM (TEE);  Surgeon: Lelon Perla, MD;  Location: [facility];  Service: Cardiovascular;  Laterality: N/A;  . THROMBECTOMY BRACHIAL ARTERY Left 02/06/2015   Procedure: EVACUATION OF LEFT ARM HEMATOMA;  Surgeon: Angelia Mould, MD;  Location: [facility];  Service: Vascular;  Laterality: Left;  . TOTAL KNEE ARTHROPLASTY Bilateral   . TUBAL LIGATION       reports that she quit smoking about 22 years ago. Her smoking use included cigarettes. She has a 0.50 pack-year smoking history. She has never used smokeless tobacco. She reports that she does not drink alcohol and does not use drugs.  Allergies  Allergen Reactions  . Amoxicillin Rash    Has patient had a PCN reaction causing immediate rash, facial/tongue/throat swelling, SOB or lightheadedness with hypotension:YES Has patient had a PCN reaction causing severe rash involving mucus membranes or skin necrosis: Yes Has patient had a PCN reaction that required hospitalization No Has patient had a PCN reaction occurring within the last 10 years: Yes If all of the above answers are "NO", then may proceed with Cephalosporin use.   . Elemental Sulfur Hives  . Sulfur Hives  . Peg 3350-Electrolytes Other (See Comments)  . Sulfa Drugs Cross Reactors Hives and  Itching  . Miralax [Polyethylene Glycol] Itching  . Percocet [Oxycodone-Acetaminophen] Itching and Rash    Did not happen last time she took it  . Sulfa Antibiotics Hives    Family History  Problem Relation Age of Onset  . Arthritis Mother   . Diabetes Father        before age 85  . Heart disease Father   . Diabetes Sister   . Cancer Brother   . Hyperlipidemia Daughter   . Hypertension Daughter   . Diabetes Daughter   . Hypertension Son   . Hyperlipidemia Son   . Dementia Sister   . Cancer Sister        Unknown  . Other Sister        Died in car accident  . Stroke Brother   . Diabetes Daughter   . Hypertension Daughter   . Hyperlipidemia Daughter   . Hepatitis C Son     Prior to Admission medications   Medication Sig Start Date End Date Taking? Authorizing Provider  acetaminophen (TYLENOL)  500 MG tablet Take 2 tablets (1,000 mg total) by mouth every 8 (eight) hours as needed for mild pain or headache. 11/02/18   [name], FNP  albuterol (VENTOLIN HFA) 108 (90 Base) MCG/ACT inhaler Inhale 2 puffs into the lungs every 6 (six) hours as needed for wheezing or shortness of breath. 05/19/20   [name], FNP  alendronate (FOSAMAX) 70 MG tablet Take 1 tablet (70 mg total) by mouth every Thursday. 05/21/20   [name], FNP  aspirin EC 81 MG tablet Take 81 mg by mouth daily.    [provider]  budesonide-formoterol (SYMBICORT) 80-4.5 MCG/ACT inhaler Inhale 2 puffs into the lungs 2 (two) times daily. 05/19/20   [name], FNP  cinacalcet (SENSIPAR) 30 MG tablet Take 1 tablet (30 mg total) by mouth every Monday, Wednesday, and Friday. 05/28/17   Allie Bossier, MD  docusate calcium (SURFAK) 240 MG capsule Take 1 capsule (240 mg total) by mouth daily. 11/02/18   [name], FNP  doxercalciferol (HECTOROL) 0.5 MCG capsule Doxercalciferol (Hectorol) 06/15/20 06/14/21  [provider]  doxercalciferol (HECTOROL) 4 MCG/2ML injection Inject 0.5  mLs (1 mcg total) into the vein every Monday, Wednesday, and Friday with hemodialysis. 05/29/17   Allie Bossier, MD  ezetimibe (ZETIA) 10 MG tablet Take 1 tablet (10 mg total) by mouth daily. 05/19/20   [name], FNP  famotidine (PEPCID) 20 MG tablet Take 1 tablet (20 mg total) by mouth 2 (two) times daily. 05/19/20   [name], FNP  HYDROcodone-acetaminophen (NORCO/VICODIN) 5-325 MG tablet Take 1 tablet by mouth every 6 (six) hours as needed for moderate pain. 05/19/20   [name], FNP  levothyroxine (SYNTHROID) 112 MCG tablet TAKE 1 TABLET BY MOUTH EVERY DAY 06/12/20   [name], FNP  Methoxy PEG-Epoetin Beta (MIRCERA IJ) Mircera 07/31/19 07/29/20  [provider]  Multiple Vitamin (MULTIVITAMIN) tablet Take 1 tablet by mouth daily.    [provider]  multivitamin (RENA-VIT) TABS tablet Take 1 tablet by mouth daily.    [provider]  nystatin cream (MYCOSTATIN) Apply 1 application topically 2 (two) times daily as needed for dry skin. 03/10/20   Hassell Done, Mary-Margaret, FNP  OXYGEN Inhale 3 L into the lungs continuous.    [provider]  pantoprazole (PROTONIX) 20 MG tablet Take 1 tablet (20 mg total) by mouth daily. 05/19/20   [name], FNP  pramipexole (MIRAPEX) 0.125 MG tablet Take 1 tablet (0.125 mg total) by mouth at bedtime. 05/19/20   [name], FNP  Vitamin D, Ergocalciferol, (DRISDOL) 1.25 MG (50000 UNIT) CAPS capsule Take 1 capsule (50,000 Units total) by mouth every Monday. 01/13/20   [name], FNP    Physical Exam: Vitals:   07/14/20 1659  BP: 112/68  Pulse: 90  Resp: 18    General: Vital signs reviewed.  Pleasant elderly lady,, in no acute distress and cooperative with exam.  Head: Normocephalic and atraumatic. Eyes: EOMI, conjunctivae normal, no scleral icterus.  ENMT: Mucous membranes are moist.  Neck: Supple, trachea midline, normal ROM, Cardiovascular: RRR, S1 normal, S2 normal, no murmurs,  gallops, or rubs. Pulmonary/Chest: Clear to auscultation bilaterally, no wheezes, rales, or rhonchi. Abdominal: Soft, non-tender, non-distended, BS +,  Extremities: No lower extremity edema bilaterally,  pulses symmetric and intact bilaterally. No cyanosis or clubbing. Neurological: A&O x3, Strength is normal and symmetric bilaterally, cranial nerve II-XII are grossly intact, no focal motor deficit, sensory  intact to light touch bilaterally.  Psychiatric: Normal mood and affect. speech and behavior is normal.  Labs on Admission: I have personally reviewed following labs and imaging studies  CBC: Recent Labs  Lab 07/14/20 1357  HGB 7.0 Repeated and verified X2.*   Basic Metabolic Panel: No results for input(s): NA, K, CL, CO2, GLUCOSE, BUN, CREATININE, CALCIUM, MG, PHOS in the last 168 hours. GFR: CrCl cannot be calculated (Patient's most recent lab result is older than the maximum 21 days allowed.). Liver Function Tests: No results for input(s): AST, ALT, ALKPHOS, BILITOT, PROT, ALBUMIN in the last 168 hours. No results for input(s): LIPASE, AMYLASE in the last 168 hours. No results for input(s): AMMONIA in the last 168 hours. Coagulation Profile: No results for input(s): INR, PROTIME in the last 168 hours. Cardiac Enzymes: No results for input(s): CKTOTAL, CKMB, CKMBINDEX, TROPONINI in the last 168 hours. BNP (last 3 results) No results for input(s): PROBNP in the last 8760 hours. HbA1C: No results for input(s): HGBA1C in the last 72 hours. CBG: No results for input(s): GLUCAP in the last 168 hours. Lipid Profile: No results for input(s): CHOL, HDL, LDLCALC, TRIG, CHOLHDL, LDLDIRECT in the last 72 hours. Thyroid Function Tests: No results for input(s): TSH, T4TOTAL, FREET4, T3FREE, THYROIDAB in the last 72 hours. Anemia Panel: No results for input(s): VITAMINB12, FOLATE, FERRITIN, TIBC, IRON, RETICCTPCT in the last 72 hours. Urine analysis:    Component Value Date/Time    COLORURINE YELLOW 07/26/2013 1202   APPEARANCEUR Cloudy (A) 05/20/2020 1128   LABSPEC 1.016 07/26/2013 1202   PHURINE 7.0 07/26/2013 1202   GLUCOSEU Negative 05/20/2020 1128   HGBUR SMALL (A) 07/26/2013 1202   BILIRUBINUR Negative 05/20/2020 1128   KETONESUR NEGATIVE 07/26/2013 1202   PROTEINUR 3+ (A) 05/20/2020 1128   PROTEINUR 100 (A) 07/26/2013 1202   UROBILINOGEN 1.0 07/26/2013 1202   NITRITE Negative 05/20/2020 1128   NITRITE NEGATIVE 07/26/2013 1202   LEUKOCYTESUR 3+ (A) 05/20/2020 1128    Radiological Exams on Admission: No results found.   Assessment/Plan Active Problems:   Lower GI bleed   Lower GI bleed.  Symptoms are currently concerning for lower GI bleed. GI is following. -1 unit of PRBC -Abdominal CTA -Monitor hemoglobin.  ESRD.  Patient gets dialysis on Monday, Wednesday and Friday. -Nephrology consult for dialysis-notified Dr. Carolin Sicks -Continue renal meds once med rec is complete.  History of A. fib.  Not on any rate controlling medicine at home. Just take aspirin. -Hold aspirin because of GI bleed.  History of COPD.  Not in acute exacerbation. -Continue home bronchodilators. -Continuous supplemental oxygen.   DVT prophylaxis: SCDs Code Status: DNR Family Communication: DIL was updated at bedside Disposition Plan: Home Consults called: GI, nephrology Admission status: Inpatient   Lorella Nimrod MD [facility]  If 7PM-7AM, please contact night-coverage [email or web address]  07/14/2020, 5:38 PM   This record has been created using Dragon voice recognition software. Errors have been sought and corrected,but may not always be located. Such creation errors do not reflect on the standard of care.

## 2020-07-14 NOTE — Patient Instructions (Signed)
If you are age 80 or older, your body mass index should be between 23-30. Your Body mass index is 25.42 kg/m. If this is out of the aforementioned range listed, please consider follow up with your Primary Care Provider.  If you are age 23 or younger, your body mass index should be between 19-25. Your Body mass index is 25.42 kg/m. If this is out of the aformentioned range listed, please consider follow up with your Primary Care Provider.   Your provider has requested that you go to the basement level for lab work before leaving today. Press "B" on the elevator. The lab is located at the first door on the left as you exit the elevator.  Thank you for choosing me and [facility].  Ellouise Newer, PA-C

## 2020-07-14 NOTE — Progress Notes (Signed)
Chief Complaint: Hematochezia  HPI:    [name] is an 80 year old Caucasian female, known to Dr. Loletha Carrow, with past medical history as listed below including COPD on oxygen at night as well as ESRD on dialysis Monday Wednesday Friday, as well as A. fib on aspirin, who was referred to me by [name], FNP for a complaint of hematochezia.      08/20/2018 patient consulted by our service in the hospital for GI bleeding with painless hematochezia and acute on chronic anemia.  She underwent EGD and colonoscopy.    08/21/2018 colonoscopy with hemorrhoids, diverticulosis in the sigmoid, descending and transverse colon, 5 2-4 mm polyps in the rectum, sigmoid colon and transverse colon as well as a 1 mm polyp in the descending colon.  There was no active bleeding at that time but given the old blood it was thought that this was likely from diverticular bleeding.  Pathology showed tubular adenomas.      08/21/2018 EGD with LA grade C esophagitis, small hiatal hernia and nonbleeding erosive gastropathy.    07/01/2020 patient seen in the emergency room and reported black stools chronically due to iron use but had bright red blood that day.  Hemoglobin was 10.1.  CT abdomen pelvis with contrast showed diverticulosis in the sigmoid colon but no gross findings for diverticulitis although portions of the mid sigmoid colon were obscured by streak artifact.  Markedly atrophic kidneys with nephrolithiasis.  Coronary and aortic calcified plaque.  Small fat-containing umbilical hernia.  She was discharged with Cipro and Flagyl and told she had diverticulitis.    07/06/2020 patient seen by [facility] family medicine nurse practitioner for anemia and chronic kidney disease on chronic dialysis.  At that time it was discussed that she had anemia found on a routine CBC.  It was noted that a week prior her hemoglobin was 10 and had dropped down to 8 in clinic that day.  She had a Hemoccult study done which was positive.   Apparently they called dialysis who said they would not do a transfusion unless she was at a hemoglobin of 7.    Today, patient presents to clinic accompanied by her daughter.  Together they explain that the patient has had 2 weeks of bright red blood per rectum typically with a bowel movement but sometimes without.  This occurs 4-7 times a day and oftentimes will just "pour out of her".  The last time this occurred was this morning before noon.  She tells me that oftentimes when she gets up in the morning she can just feel it "release" into her Depends.  Her daughter tells me initially there was a lot of clots and now it is really just bright red blood.  Over the past few days patient has been getting weaker and weaker, unable to walk short distances without feeling short of breath as well as experiencing dizziness.  They tell me she chronically has black stools from iron usage, but this has not changed recently.  Do describe seeing PCP as above but tell me they were really not given any direction afterwards.  Patient continues to take her aspirin for A. Fib.    Denies fever, chills, weight loss or abdominal pain.  Past Medical History:  Diagnosis Date  . Acute respiratory failure ([place]) 05/2017  . Anemia   . Anxiety   . Arthritis   . COPD (chronic obstructive pulmonary disease) ([place])   . Depression   . ESRD (end stage renal disease) ([place])  dialysis MWF  . GERD (gastroesophageal reflux disease)   . Gout   . History of blood transfusion   . History of diabetes mellitus    "diet controlled"  . HLD (hyperlipidemia)   . HOH (hard of hearing)    left ear  . Hypertension    hypotensive -since starting dialysis  . Hypothyroidism   . MRSA (methicillin resistant staph aureus) culture positive 06/01/2017  . PAF (paroxysmal atrial fibrillation) ([place])    a. Echo 11/16:  Mild LVH, EF 55-60%, normal wall motion, MAC, mild MR, severe LAE (49 ml/m2), mild RVE, normal RVSF, mild RAE, mild TR, PASP 24  mmHg;  CHADS2-VASc: 4 >> Coumadin followed by PCP    Past Surgical History:  Procedure Laterality Date  . AV FISTULA PLACEMENT  04/03/2012   Procedure: ARTERIOVENOUS (AV) FISTULA CREATION;  Surgeon: Elam Dutch, MD;  Location: [place];  Service: Vascular;  Laterality: Left;  creation left brachial cephalic fistula   . BIOPSY  08/21/2018   Procedure: BIOPSY;  Surgeon: Thornton Park, MD;  Location: [place];  Service: Gastroenterology;;  . CARPAL TUNNEL RELEASE Bilateral   . COLONOSCOPY W/ POLYPECTOMY    . COLONOSCOPY WITH PROPOFOL N/A 08/21/2018   Procedure: COLONOSCOPY WITH PROPOFOL;  Surgeon: Thornton Park, MD;  Location: [place];  Service: Gastroenterology;  Laterality: N/A;  . DILATION AND CURETTAGE OF UTERUS    . ESOPHAGOGASTRODUODENOSCOPY (EGD) WITH PROPOFOL N/A 08/21/2018   Procedure: ESOPHAGOGASTRODUODENOSCOPY (EGD) WITH PROPOFOL;  Surgeon: Thornton Park, MD;  Location: [place];  Service: Gastroenterology;  Laterality: N/A;  . EYE SURGERY Bilateral    bilateral cataract removal  . HEMATOMA EVACUATION Left 05/14/2018   Procedure: Incision and Drainage of Left Arm Hematoma;  Surgeon: Elam Dutch, MD;  [facility];  Service: Vascular;  Laterality: Left;  . Hemodialysis  catheter Right   . IR FLUORO GUIDE CV LINE RIGHT  05/26/2017  . IR REMOVAL TUN CV CATH W/O FL  05/24/2017  . IR US GUIDE VASC ACCESS RIGHT  05/26/2017  . LIGATION OF ARTERIOVENOUS  FISTULA Left 02/04/2015   Procedure: LIGATION OF BRACHIOCEPHALIC ARTERIOVENOUS  FISTULA;  Surgeon: Conrad Donley, MD;  Location: [place];  Service: Vascular;  Laterality: Left;  Marland Kitchen MULTIPLE TOOTH EXTRACTIONS    . PARTIAL HIP ARTHROPLASTY    . POLYPECTOMY  08/21/2018   Procedure: POLYPECTOMY;  Surgeon: Thornton Park, MD;  Location: [place];  Service: Gastroenterology;;  . PORTACATH PLACEMENT    . REVERSE SHOULDER ARTHROPLASTY Left 01/22/2016   Procedure: LEFT REVERSE SHOULDER ARTHROPLASTY;  Surgeon:  Netta Cedars, MD;  Location: [place];  Service: Orthopedics;  Laterality: Left;  . SHOULDER ARTHROSCOPY Bilateral   . SPLIT NIGHT STUDY  07/26/2015  . STERIOD INJECTION Right 01/22/2016   Procedure: RIGHT RING FINGER STEROID INJECTION;  Surgeon: Netta Cedars, MD;  Location: [place];  Service: Orthopedics;  Laterality: Right;  . TEE WITHOUT CARDIOVERSION N/A 05/26/2017   Procedure: TRANSESOPHAGEAL ECHOCARDIOGRAM (TEE);  Surgeon: Lelon Perla, MD;  Location: [place];  Service: Cardiovascular;  Laterality: N/A;  . THROMBECTOMY BRACHIAL ARTERY Left 02/06/2015   Procedure: EVACUATION OF LEFT ARM HEMATOMA;  Surgeon: Angelia Mould, MD;  Location: [place];  Service: Vascular;  Laterality: Left;  . TOTAL KNEE ARTHROPLASTY Bilateral   . TUBAL LIGATION      Current Outpatient Medications  Medication Sig Dispense Refill  . acetaminophen (TYLENOL) 500 MG tablet Take 2 tablets (1,000 mg total) by mouth every 8 (eight) hours as  needed for mild pain or headache. 30 tablet 0  . albuterol (VENTOLIN HFA) 108 (90 Base) MCG/ACT inhaler Inhale 2 puffs into the lungs every 6 (six) hours as needed for wheezing or shortness of breath. 18 g 2  . alendronate (FOSAMAX) 70 MG tablet Take 1 tablet (70 mg total) by mouth every Thursday. 12 tablet 3  . aspirin EC 81 MG tablet Take 81 mg by mouth daily.    . budesonide-formoterol (SYMBICORT) 80-4.5 MCG/ACT inhaler Inhale 2 puffs into the lungs 2 (two) times daily. 3 each 4  . cinacalcet (SENSIPAR) 30 MG tablet Take 1 tablet (30 mg total) by mouth every Monday, Wednesday, and Friday. 60 tablet 0  . docusate calcium (SURFAK) 240 MG capsule Take 1 capsule (240 mg total) by mouth daily. 90 capsule 1  . doxercalciferol (HECTOROL) 0.5 MCG capsule Doxercalciferol (Hectorol)    . doxercalciferol (HECTOROL) 4 MCG/2ML injection Inject 0.5 mLs (1 mcg total) into the vein every Monday, Wednesday, and Friday with hemodialysis. 2 mL 0  . ezetimibe (ZETIA) 10 MG tablet Take 1  tablet (10 mg total) by mouth daily. 90 tablet 3  . famotidine (PEPCID) 20 MG tablet Take 1 tablet (20 mg total) by mouth 2 (two) times daily. 180 tablet 1  . HYDROcodone-acetaminophen (NORCO/VICODIN) 5-325 MG tablet Take 1 tablet by mouth every 6 (six) hours as needed for moderate pain. 30 tablet 0  . levothyroxine (SYNTHROID) 112 MCG tablet TAKE 1 TABLET BY MOUTH EVERY DAY 90 tablet 0  . Methoxy PEG-Epoetin Beta (MIRCERA IJ) Mircera    . Multiple Vitamin (MULTIVITAMIN) tablet Take 1 tablet by mouth daily.    . multivitamin (RENA-VIT) TABS tablet Take 1 tablet by mouth daily.    Marland Kitchen nystatin cream (MYCOSTATIN) Apply 1 application topically 2 (two) times daily as needed for dry skin. 30 g 0  . OXYGEN Inhale 3 L into the lungs continuous.    . pantoprazole (PROTONIX) 20 MG tablet Take 1 tablet (20 mg total) by mouth daily. 90 tablet 1  . pramipexole (MIRAPEX) 0.125 MG tablet Take 1 tablet (0.125 mg total) by mouth at bedtime. 90 tablet 1  . Vitamin D, Ergocalciferol, (DRISDOL) 1.25 MG (50000 UNIT) CAPS capsule Take 1 capsule (50,000 Units total) by mouth every Monday. 12 capsule 3   No current facility-administered medications for this visit.    Allergies as of 07/14/2020 - Review Complete 07/14/2020  Allergen Reaction Noted  . Amoxicillin Rash 02/02/2011  . Elemental sulfur Hives 01/28/2020  . Sulfur Hives 01/28/2020  . Peg 3350-electrolytes Other (See Comments) 01/28/2020  . Sulfa drugs cross reactors Hives and Itching 02/02/2011  . Percocet [oxycodone-acetaminophen] Itching and Rash 02/02/2011  . Sulfa antibiotics Hives 09/05/2015    Family History  Problem Relation Age of Onset  . Arthritis Mother   . Diabetes Father        before age 18  . Heart disease Father   . Diabetes Sister   . Cancer Brother   . Hyperlipidemia Daughter   . Hypertension Daughter   . Diabetes Daughter   . Hypertension Son   . Hyperlipidemia Son   . Dementia Sister   . Cancer Sister        Unknown  .  Other Sister        Died in car accident  . Stroke Brother   . Diabetes Daughter   . Hypertension Daughter   . Hyperlipidemia Daughter   . Hepatitis C Son     Social History  Socioeconomic History  . Marital status: Widowed    Spouse name: Not on file  . Number of children: 6  . Years of education: 11  . Highest education level: 10th grade  Occupational History  . Occupation: RETIRED  Tobacco Use  . Smoking status: Former Smoker    Packs/day: 0.25    Years: 2.00    Pack years: 0.50    Types: Cigarettes    Quit date: 04/04/1998    Years since quitting: 22.2  . Smokeless tobacco: Never Used  Vaping Use  . Vaping Use: Never used  Substance and Sexual Activity  . Alcohol use: No    Alcohol/week: 0.0 standard drinks  . Drug use: No  . Sexual activity: Not Currently    Birth control/protection: None  Other Topics Concern  . Not on file  Social History Narrative   ** Merged History Encounter **       Widowed 6 children Lives with son and daughter in law homemaker   Social Determinants of Health   Financial Resource Strain: Not on file  Food Insecurity: Not on file  Transportation Needs: Not on file  Physical Activity: Not on file  Stress: Not on file  Social Connections: Not on file  Intimate Partner Violence: Not on file    Review of Systems:    Constitutional: No weight loss, fever or chills +weakness Skin: No rash Cardiovascular: No chest pain Respiratory:+DOE Gastrointestinal: See HPI and otherwise negative Genitourinary: No dysuria Neurological: +dizziness Musculoskeletal: No new muscle or joint pain Hematologic: No bruising Psychiatric: No history of depression or anxiety   Physical Exam:  Vital signs: BP 110/64   Pulse 66   Ht _0  (1.575 m)   Wt 139 lb (63 kg)   BMI 25.42 kg/m   Constitutional:   Pleasant Elderly , pale appearing, Caucasian female appears to be in NAD, Well developed, Well nourished, alert and cooperative Head:   Normocephalic and atraumatic. Eyes:   PEERL, EOMI. No icterus. Conjunctiva pink. Ears:  Normal auditory acuity. Neck:  Supple Throat: Oral cavity and pharynx without inflammation, swelling or lesion.  Respiratory: Respirations even and unlabored. Lungs clear to auscultation bilaterally.   No wheezes, crackles, or rhonchi.  Cardiovascular: Normal S1, S2. No MRG. Regular rate and rhythm. No peripheral edema, cyanosis or pallor.  Gastrointestinal:  Soft, nondistended, nontender. No rebound or guarding. Normal bowel sounds. No appreciable masses or hepatomegaly. Rectal:  Not performed.  Msk:  Symmetrical without gross deformities. Without edema, no deformity or joint abnormality. +in wheelchair Neurologic:  Alert and  oriented x4;  grossly normal neurologically.  Skin:   Dry and intact without significant lesions or rashes. Psychiatric: Demonstrates good judgement and reason without abnormal affect or behaviors.  RELEVANT LABS AND IMAGING: CBC    Component Value Date/Time   WBC 10.9 (H) 05/19/2020 1502   WBC 12.8 (H) 12/11/2019 1424   RBC 3.39 (L) 05/19/2020 1502   RBC 3.12 (L) 12/11/2019 1424   HGB 10.4 (L) 05/19/2020 1502   HCT 31.6 (L) 05/19/2020 1502   PLT 499 (H) 05/19/2020 1502   MCV 93 05/19/2020 1502   MCH 30.7 05/19/2020 1502   MCH 30.4 12/11/2019 1424   MCHC 32.9 05/19/2020 1502   MCHC 30.3 12/11/2019 1424   RDW 14.4 05/19/2020 1502   LYMPHSABS 1.0 05/19/2020 1502   MONOABS 1.4 (H) 12/11/2019 1424   EOSABS 0.1 05/19/2020 1502   BASOSABS 0.1 05/19/2020 1502    CMP     Component  Value Date/Time   NA 145 (H) 05/19/2020 1502   K 4.5 05/19/2020 1502   CL 99 05/19/2020 1502   CO2 19 (L) 05/19/2020 1502   GLUCOSE 111 (H) 05/19/2020 1502   GLUCOSE 102 (H) 12/11/2019 1424   BUN 31 (H) 05/19/2020 1502   CREATININE 5.20 (HH) 05/19/2020 1502   CALCIUM 8.6 (L) 05/19/2020 1502   PROT 6.0 05/19/2020 1502   ALBUMIN 3.4 (L) 05/19/2020 1502   AST 21 05/19/2020 1502   ALT 11  05/19/2020 1502   ALKPHOS 120 05/19/2020 1502   BILITOT <0.2 05/19/2020 1502   GFRNONAA 7 (L) 05/19/2020 1502   GFRAA 8 (L) 05/19/2020 1502    Assessment: 1.  Hemoccult positive acute on chronic symptomatic anemia: Reports hematochezia for the past 2 weeks at least 4 or more times a day, hemoglobin has dropped from around 10 down to 7 today in clinic, Hemoccult positive per PCP last week, history of diverticular bleed in 2020 with EGD and colonoscopy; most likely diverticular bleed 2.  A. fib: On aspirin 3.  OSA on oxygen at night 4.  ESRD on HD Monday Wednesday Friday  Plan: 1. Admitted patient to the hospital via [facility] hospitalist service given ongoing hematochezia and now symptomatic anemia in a patient on dialysis and oxygen. 2.  Would recommend CTA for further evaluation of ongoing bleeding to help localize 3.  Hemoglobin did return at 7 shortly after the patient left the clinic, she will require at least 1 unit of PRBCs now and continued monitoring of hemoglobin every 6-8 hours with transfusion as needed less than 7 4.  Patient will require her regular dialysis tomorrow pending any abnormalities in her labs today at time of admission 5.  I did alert our service including Azucena Freed, [place] and Dr. Hilarie Fredrickson that patient was being admitted.  They are aware and will consult. 6.  Patient will be given further recommendations from our team during her hospital stay.  Ellouise Newer, PA-C [facility] 07/14/2020, 1:56 PM  Cc: [name], FNP

## 2020-07-14 NOTE — Plan of Care (Signed)

## 2020-07-15 DIAGNOSIS — K922 Gastrointestinal hemorrhage, unspecified: Principal | ICD-10-CM

## 2020-07-15 DIAGNOSIS — D62 Acute posthemorrhagic anemia: Secondary | ICD-10-CM

## 2020-07-15 LAB — COMPREHENSIVE METABOLIC PANEL
ALT: 15 U/L (ref 0–44)
AST: 23 U/L (ref 15–41)
Albumin: 2.5 g/dL — ABNORMAL LOW (ref 3.5–5.0)
Alkaline Phosphatase: 75 U/L (ref 38–126)
Anion gap: 11 (ref 5–15)
BUN: 29 mg/dL — ABNORMAL HIGH (ref 8–23)
CO2: 26 mmol/L (ref 22–32)
Calcium: 7.8 mg/dL — ABNORMAL LOW (ref 8.9–10.3)
Chloride: 104 mmol/L (ref 98–111)
Creatinine, Ser: 4.93 mg/dL — ABNORMAL HIGH (ref 0.44–1.00)
GFR, Estimated: 8 mL/min — ABNORMAL LOW (ref >60)
Glucose, Bld: 71 mg/dL (ref 70–99)
Potassium: 4.1 mmol/L (ref 3.5–5.1)
Sodium: 141 mmol/L (ref 135–145)
Total Bilirubin: 0.7 mg/dL (ref 0.3–1.2)
Total Protein: 5.1 g/dL — ABNORMAL LOW (ref 6.5–8.1)

## 2020-07-15 LAB — CBC
HCT: 24.3 % — ABNORMAL LOW (ref 36.0–46.0)
HCT: 27 % — ABNORMAL LOW (ref 36.0–46.0)
Hemoglobin: 7.7 g/dL — ABNORMAL LOW (ref 12.0–15.0)
Hemoglobin: 8.5 g/dL — ABNORMAL LOW (ref 12.0–15.0)
MCH: 30.6 pg (ref 26.0–34.0)
MCH: 29.9 pg (ref 26.0–34.0)
MCHC: 31.7 g/dL (ref 30.0–36.0)
MCHC: 31.5 g/dL (ref 30.0–36.0)
MCV: 96.4 fL (ref 80.0–100.0)
MCV: 95.1 fL (ref 80.0–100.0)
Platelets: 283 10*3/uL (ref 150–400)
Platelets: 278 10*3/uL (ref 150–400)
RBC: 2.52 MIL/uL — ABNORMAL LOW (ref 3.87–5.11)
RBC: 2.84 MIL/uL — ABNORMAL LOW (ref 3.87–5.11)
RDW: 17.5 % — ABNORMAL HIGH (ref 11.5–15.5)
RDW: 17.2 % — ABNORMAL HIGH (ref 11.5–15.5)
WBC: 9.2 10*3/uL (ref 4.0–10.5)
WBC: 10.9 10*3/uL — ABNORMAL HIGH (ref 4.0–10.5)
nRBC: 0 % (ref 0.0–0.2)
nRBC: 0 % (ref 0.0–0.2)

## 2020-07-15 LAB — HEPATITIS B SURFACE ANTIBODY,QUALITATIVE: Hep B S Ab: NONREACTIVE

## 2020-07-15 LAB — HEPATITIS B SURFACE ANTIGEN: Hepatitis B Surface Ag: NONREACTIVE

## 2020-07-15 LAB — PREPARE RBC (CROSSMATCH)

## 2020-07-15 LAB — MRSA PCR SCREENING: MRSA by PCR: POSITIVE — AB

## 2020-07-15 LAB — SARS CORONAVIRUS 2 (TAT 6-24 HRS): SARS Coronavirus 2: NEGATIVE

## 2020-07-15 LAB — HEPATITIS B CORE ANTIBODY, TOTAL: Hep B Core Total Ab: NONREACTIVE

## 2020-07-15 MED ORDER — PENTAFLUOROPROP-TETRAFLUOROETH EX AERO: 1.0000 "application " | INHALATION_SPRAY | CUTANEOUS | Status: DC | PRN

## 2020-07-15 MED ORDER — SODIUM CHLORIDE 0.9 % IV SOLN: 100.0000 mL | INTRAVENOUS | Status: DC | PRN

## 2020-07-15 MED ORDER — LIDOCAINE HCL (PF) 1 % IJ SOLN: 5.0000 mL | INTRAMUSCULAR | Status: DC | PRN

## 2020-07-15 MED ORDER — ALTEPLASE 2 MG IJ SOLR: 2.0000 mg | Freq: Once | INTRAMUSCULAR | Status: DC | PRN

## 2020-07-15 MED ORDER — DARBEPOETIN ALFA 100 MCG/0.5ML IJ SOSY
PREFILLED_SYRINGE | INTRAMUSCULAR | Status: AC
  Administered 2020-07-15: 100 ug via INTRAVENOUS
  Filled 2020-07-15: qty 0.5

## 2020-07-15 MED ORDER — HEPARIN SODIUM (PORCINE) 1000 UNIT/ML DIALYSIS
1000.0000 [IU] | INTRAMUSCULAR | Status: DC | PRN
  Administered 2020-07-15: 5.2 [IU] via INTRAVENOUS_CENTRAL

## 2020-07-15 MED ORDER — HEPARIN SODIUM (PORCINE) 1000 UNIT/ML IJ SOLN
INTRAMUSCULAR | Status: AC
  Administered 2020-07-15: 1000 [IU]
  Filled 2020-07-15: qty 6

## 2020-07-15 MED ORDER — DOXERCALCIFEROL 4 MCG/2ML IV SOLN
INTRAVENOUS | Status: AC
  Filled 2020-07-15: qty 2

## 2020-07-15 MED ORDER — DARBEPOETIN ALFA 100 MCG/0.5ML IJ SOSY
100.0000 ug | PREFILLED_SYRINGE | INTRAMUSCULAR | Status: DC
  Administered 2020-07-15: 100 ug via INTRAVENOUS

## 2020-07-15 MED ORDER — CHLORHEXIDINE GLUCONATE CLOTH 2 % EX PADS
6.0000 | MEDICATED_PAD | Freq: Every day | CUTANEOUS | Status: DC
  Administered 2020-07-15 – 2020-07-17 (×2): 6 via TOPICAL

## 2020-07-15 MED ORDER — PRAMIPEXOLE DIHYDROCHLORIDE 0.25 MG PO TABS
0.1250 mg | ORAL_TABLET | Freq: Every day | ORAL | Status: DC
  Administered 2020-07-15 – 2020-07-16 (×2): 0.125 mg via ORAL
  Filled 2020-07-15 (×2): qty 1

## 2020-07-15 MED ORDER — CHLORHEXIDINE GLUCONATE CLOTH 2 % EX PADS
6.0000 | MEDICATED_PAD | Freq: Every day | CUTANEOUS | Status: DC
  Administered 2020-07-17: 6 via TOPICAL

## 2020-07-15 MED ORDER — LIDOCAINE-PRILOCAINE 2.5-2.5 % EX CREA: 1.0000 "application " | TOPICAL_CREAM | CUTANEOUS | Status: DC | PRN

## 2020-07-15 MED ORDER — MUPIROCIN 2 % EX OINT
1.0000 "application " | TOPICAL_OINTMENT | Freq: Two times a day (BID) | CUTANEOUS | Status: DC
  Administered 2020-07-15 – 2020-07-17 (×4): 1 via NASAL
  Filled 2020-07-15: qty 22

## 2020-07-15 NOTE — Progress Notes (Signed)
____________________________________________________________  Attending physician addendum:  Thank you for sending this case to me. I have reviewed the entire note and agree with the plan.  Inpatient evaluation certainly warranted.  Wilfrid Lund, MD  ____________________________________________________________

## 2020-07-15 NOTE — Plan of Care (Signed)

## 2020-07-15 NOTE — Plan of Care (Signed)
Patient up and in the chair resting without any distress at this time. PRN tylenol given for CO headache. Patient went for dialysis no complains. VS remains to be WNL. Daughter in law at bedside most of the day. Bed kept in low position and locked. Call bell in reach. All needs met at this time. No further complains at this time.  Problem: Education: Goal: Knowledge of General Education information will improve Description: Including pain rating scale, medication(s)/side effects and non-pharmacologic comfort measures Outcome: Progressing   Problem: Health Behavior/Discharge Planning: Goal: Ability to manage health-related needs will improve Outcome: Progressing   Problem: Clinical Measurements: Goal: Ability to maintain clinical measurements within normal limits will improve Outcome: Progressing Goal: Will remain free from infection Outcome: Progressing Goal: Diagnostic test results will improve Outcome: Progressing Goal: Respiratory complications will improve Outcome: Progressing Goal: Cardiovascular complication will be avoided Outcome: Progressing   Problem: Activity: Goal: Risk for activity intolerance will decrease Outcome: Progressing   Problem: Nutrition: Goal: Adequate nutrition will be maintained Outcome: Progressing   Problem: Coping: Goal: Level of anxiety will decrease Outcome: Progressing   Problem: Elimination: Goal: Will not experience complications related to bowel motility Outcome: Progressing Goal: Will not experience complications related to urinary retention Outcome: Progressing   Problem: Pain Managment: Goal: General experience of comfort will improve Outcome: Progressing   Problem: Safety: Goal: Ability to remain free from injury will improve Outcome: Progressing   Problem: Skin Integrity: Goal: Risk for impaired skin integrity will decrease Outcome: Progressing   

## 2020-07-15 NOTE — Progress Notes (Signed)
PROGRESS NOTE    [name]  WUJ:[id number] DOB: 01/30/1941 DOA: 07/14/2020 PCP: [name], FNP   Brief Narrative:  HPI: [name] is a 80 y.o. female with medical history significant of COPD, on oxygen at night, ESRD,  MW F, A. Fib-on aspirin for that, iron deficiency anemia was sent to hospital as a direct admit from her gastroenterologist office where she was presented with bright red blood per rectum going on for about 2 weeks.  Patient had similar symptoms intermittently for the past 2 years and had EGDs and colonoscopies. Patient was complaining of worsening weakness.  Denies any fever or chills.  Denies any abdominal pain.  No chest pain, do have some exertional dyspnea which is seems chronic, no orthopnea or PND.  No urinary symptoms.  She was seen by her PCP recently and her hemoglobin was 8, Hemoccult test was positive and they advised transfusion but her dialysis center refused transfusion unless hemoglobin is below 7.  Today at GI office her hemoglobin was 7.  Baseline around 10. 1 unit of PRBC ordered. GI is recommending CTA.  ED Course: Hemodynamically stable, labs done at GI office significant for hemoglobin of 7.  Assessment & Plan:   Active Problems:   Lower GI bleed   Acute blood loss anemia secondary to lower GI bleed/acute on chronic symptomatic anemia: Reportedly patient's hemoglobin was down to 7 in the GI clinic that was all the way down from 10 few days ago.  Reports history of 2 weeks of hematochezia, at least 4/day.  Was sent from GI clinic for direct admission.  Status post 1 unit PRBC transfusion on 07/14/2020.  Hemoglobin 7.7 today.  No need for IV Protonix, will discontinue that.  She is on famotidine which we will continue.  Monitor H&H every 12 hours and transfuse if hemoglobin drops less than 7.  Nuclear scan negative for any active bleeding.  Awaiting GI consultation for further recommendations.  ESRD.  Patient gets dialysis on Monday, Wednesday  and Friday. Nephrology on board.  History of A. fib.  Not on any rate controlling medicine at home. Just take aspirin. -Hold aspirin because of GI bleed.  History of COPD.  Not in acute exacerbation. -Continue home bronchodilators. -Continuous supplemental oxygen.  DVT prophylaxis: SCDs Start: 07/14/20 1725   Code Status: DNR  Family Communication: Daughter at bedside.  Plan of care discussed with patient and her daughter in length and he verbalized understanding and agreed with it.  Status is: Inpatient  Remains inpatient appropriate because:Ongoing diagnostic testing needed not appropriate for outpatient work up   Dispo: The patient is from: Home              Anticipated d/c is to: Home              Patient currently is not medically stable to d/c.   Difficult to place patient No        Estimated body mass index is 26.81 kg/m as calculated from the following:   Height as of an earlier encounter on 07/14/20: 5\' 2"  (1.575 m).   Weight as of this encounter: 66.5 kg.      Nutritional status:               Consultants:   GI  Nephrology  Procedures:   None  Antimicrobials:  Anti-infectives (From admission, onward)   None         Subjective: Seen and examined.  Feels better.  No  complaints.  Daughter at the bedside.  Objective: Vitals:   07/15/20 1200 07/15/20 1230 07/15/20 1300 07/15/20 1330  BP: (!) 124/110 127/90 138/88 129/85  Pulse: 84 77 79 80  Resp: 15 14 13 16   Temp:      TempSrc:      SpO2:  100%    Weight:        Intake/Output Summary (Last 24 hours) at 07/15/2020 1405 Last data filed at 07/15/2020 0430 Gross per 24 hour  Intake 434 ml  Output --  Net 434 ml   Filed Weights   07/15/20 1130  Weight: 66.5 kg    Examination:  General exam: Appears calm and comfortable  Respiratory system: Clear to auscultation. Respiratory effort normal. Cardiovascular system: S1 & S2 heard, RRR. No JVD, murmurs, rubs, gallops or  clicks. No pedal edema. Gastrointestinal system: Abdomen is nondistended, soft and nontender. No organomegaly or masses felt. Normal bowel sounds heard. Central nervous system: Alert and oriented. No focal neurological deficits. Extremities: Symmetric 5 x 5 power. Skin: No rashes, lesions or ulcers Psychiatry: Judgement and insight appear normal. Mood & affect appropriate.    Data Reviewed: I have personally reviewed following labs and imaging studies  CBC: Recent Labs  Lab 07/14/20 1357 07/15/20 0504  WBC  --  9.2  HGB 7.0 Repeated and verified X2.* 7.7*  HCT  --  24.3*  MCV  --  96.4  PLT  --  211   Basic Metabolic Panel: Recent Labs  Lab 07/15/20 0504  NA 141  K 4.1  CL 104  CO2 26  GLUCOSE 71  BUN 29*  CREATININE 4.93*  CALCIUM 7.8*   GFR: Estimated Creatinine Clearance: 8.1 mL/min (A) (by C-G formula based on SCr of 4.93 mg/dL (H)). Liver Function Tests: Recent Labs  Lab 07/15/20 0504  AST 23  ALT 15  ALKPHOS 75  BILITOT 0.7  PROT 5.1*  ALBUMIN 2.5*   No results for input(s): LIPASE, AMYLASE in the last 168 hours. No results for input(s): AMMONIA in the last 168 hours. Coagulation Profile: No results for input(s): INR, PROTIME in the last 168 hours. Cardiac Enzymes: No results for input(s): CKTOTAL, CKMB, CKMBINDEX, TROPONINI in the last 168 hours. BNP (last 3 results) No results for input(s): PROBNP in the last 8760 hours. HbA1C: No results for input(s): HGBA1C in the last 72 hours. CBG: No results for input(s): GLUCAP in the last 168 hours. Lipid Profile: No results for input(s): CHOL, HDL, LDLCALC, TRIG, CHOLHDL, LDLDIRECT in the last 72 hours. Thyroid Function Tests: No results for input(s): TSH, T4TOTAL, FREET4, T3FREE, THYROIDAB in the last 72 hours. Anemia Panel: No results for input(s): VITAMINB12, FOLATE, FERRITIN, TIBC, IRON, RETICCTPCT in the last 72 hours. Sepsis Labs: No results for input(s): PROCALCITON, LATICACIDVEN in the last 168  hours.  Recent Results (from the past 240 hour(s))  Fecal occult blood, imunochemical     Status: Abnormal   Collection Time: 07/08/20 11:03 AM   Specimen: Stool   ST  Result Value Ref Range Status   Fecal Occult Bld Positive (A) Negative Final  SARS CORONAVIRUS 2 (TAT 6-24 HRS) Nasopharyngeal Nasopharyngeal Swab     Status: None   Collection Time: 07/14/20  5:31 PM   Specimen: Nasopharyngeal Swab  Result Value Ref Range Status   SARS Coronavirus 2 NEGATIVE NEGATIVE Final    Comment: (NOTE) SARS-CoV-2 target nucleic acids are NOT DETECTED.  The SARS-CoV-2 RNA is generally detectable in upper and lower respiratory specimens during the  acute phase of infection. Negative results do not preclude SARS-CoV-2 infection, do not rule out co-infections with other pathogens, and should not be used as the sole basis for treatment or other patient management decisions. Negative results must be combined with clinical observations, patient history, and epidemiological information. The expected result is Negative.  Fact Sheet for Patients: SugarRoll.be  Fact Sheet for Healthcare Providers: https://www.woods-mathews.com/  This test is not yet approved or cleared by the [place] FDA and  has been authorized for detection and/or diagnosis of SARS-CoV-2 by FDA under an Emergency Use Authorization (EUA). This EUA will remain  in effect (meaning this test can be used) for the duration of the COVID-19 declaration under Se ction 564(b)(1) of the Act, 21 U.S.C. section 360bbb-3(b)(1), unless the authorization is terminated or revoked sooner.  Performed at [facility], [place] [address], [place], [place] [address]   MRSA PCR Screening     Status: Abnormal   Collection Time: 07/15/20 10:28 AM   Specimen: Nasopharyngeal  Result Value Ref Range Status   MRSA by PCR POSITIVE (A) NEGATIVE Final    Comment:        The GeneXpert MRSA Assay  (FDA approved for NASAL specimens only), is one component of a comprehensive MRSA colonization surveillance program. It is not intended to diagnose MRSA infection nor to guide or monitor treatment for MRSA infections. CRITICAL RESULT CALLED TO, READ BACK BY AND VERIFIED WITH: RN Alphia Moh AT [id number] ON 07/15/20 BY KJ Performed at [facility], [place] [address], [place],  [address]       Radiology Studies: NM GI Blood Loss  Result Date: 07/14/2020 CLINICAL DATA:  Gastrointestinal hemorrhage, bright red blood per rectum EXAM: NUCLEAR MEDICINE GASTROINTESTINAL BLEEDING SCAN TECHNIQUE: Sequential abdominal images were obtained following intravenous administration of Tc-32m labeled red blood cells. RADIOPHARMACEUTICALS:  26.2 mCi Tc-68m pertechnetate in-vitro labeled red cells. COMPARISON:  None. FINDINGS: Normal distribution of radiotracer within the vascular space, spleen, and liver. No evidence of active gastrointestinal hemorrhage identified. IMPRESSION: No active gastrointestinal hemorrhage. Electronically Signed   By: Fidela Salisbury MD   On: 07/14/2020 22:55    Scheduled Meds: . sodium chloride   Intravenous Once  . Chlorhexidine Gluconate Cloth  6 each Topical Daily  . Chlorhexidine Gluconate Cloth  6 each Topical Q0600  . cinacalcet  30 mg Oral Q M,W,F  . darbepoetin (ARANESP) injection - DIALYSIS  100 mcg Intravenous Q Wed-HD  . doxercalciferol  1 mcg Intravenous Q M,W,F-HD  . ezetimibe  10 mg Oral Daily  . famotidine  20 mg Oral Daily  . fluticasone furoate-vilanterol  1 puff Inhalation Daily  . levothyroxine  112 mcg Oral Daily  . multivitamin  1 tablet Oral Daily  . mupirocin ointment  1 application Nasal BID   Continuous Infusions:   LOS: 1 day   Time spent: 36 minutes   Darliss Cheney, MD [facility]  07/15/2020, 2:05 PM   To contact the attending provider between 7A-7P or the covering provider during after hours 7P-7A, please log into the web site  [email or web address].

## 2020-07-15 NOTE — Consult Note (Signed)
ESRD Consult Note [facility]  Requesting provider: Darliss Cheney, MD  Outpatient dialysis unit: [facility] Outpatient dialysis schedule: MWF  Assessment/Recommendations:  # ESRD:  3.5 hours, F160, 400/500, 2K, 2.5ca, uf profile 2. No heparin  BRBPR , likely lower GIB -gi on board, serial h/h, transfuse prn  Volume/ hypertension: EDW 64.5kg. Attempt to achieve EDW as tolerated  Anemia of Chronic Kidney Disease: due for ESA, receives mircera q2weeks, last dose 3/30 (62mg), will start aranesp with an increase in equivalent dose: 105m qweekly  Secondary Hyperparathyroidism/Hyperphosphatemia: on [place] not on formulary here, monitor phos and restrict phis in diet  Afib -asa on hold, rate controlled currently, per primary   COPD -on supplemental o2, per primary  Vascular access: LIJ TDC. Catheter dependent  # Additional recommendations: - Dose all meds for creatinine clearance < 10 ml/min  - Unless absolutely necessary, no MRIs with gadolinium.  - Implement save arm precautions.  Prefer needle sticks in the dorsum of the hands or wrists.  No blood pressure measurements in arm. - If blood transfusion is requested during hemodialysis sessions, please alert us[place]ior to the session.  - If a hemodialysis catheter line culture is requested, please alert us[place]oreas only hemodialysis nurses are able to collect those specimens.   ViGean QuintMD [facility]  History of Present Illness: [name] VIEYRAs an [age].o. female with a past medical history of ESRD on HD, afib on asa, IDA, copd on qhs o2 who presents with bright red blood per rectum. Chronic/recurrent issue. Has been having brbpr for 2 weeks. Hgb has been trending down, 8 at PCP's office. Unable to be transfused at her outpatient dialysis unit. Followed up with GI and hgb down to 7 hence direct admit here. Received 1u prbc. CTA 07/14/20 negative for active GI hemorrhage. Patient currently reports being in  her usual state of health. Feels tired. Open for HD today, no other complaints.   Medications:  Current Facility-Administered Medications  Medication Dose Route Frequency Provider Last Rate Last Admin  . 0.9 %  sodium chloride infusion (Manually program via Guardrails IV Fluids)   Intravenous Once AmLorella NimrodMD      . acetaminophen (TYLENOL) tablet 650 mg  650 mg Oral Q6H PRN AmLorella NimrodMD       Or  . acetaminophen (TYLENOL) suppository 650 mg  650 mg Rectal Q6H PRN AmLorella NimrodMD      . albuterol (PROVENTIL) (2.5 MG/3ML) 0.083% nebulizer solution 2.5 mg  2.5 mg Nebulization Q6H PRN AmLorella NimrodMD      . Chlorhexidine Gluconate Cloth 2 % PADS 6 each  6 each Topical Daily Pahwani, Ravi, MD      . Chlorhexidine Gluconate Cloth 2 % PADS 6 each  6 each Topical Q0600 SiGean QuintMD      . cinacalcet (SENSIPAR) tablet 30 mg  30 mg Oral Q M,W,F AmLorella NimrodMD      . Darbepoetin Alfa (ARANESP) injection 100 mcg  100 mcg Intravenous Q Wed-HD SiGean QuintMD      . doxercalciferol (HECTOROL) injection 1 mcg  1 mcg Intravenous Q M,W,F-HD AmLorella NimrodMD      . ezetimibe (ZETIA) tablet 10 mg  10 mg Oral Daily AmLorella NimrodMD      . famotidine (PEPCID) tablet 20 mg  20 mg Oral Daily Amin, SuSoundra PilonMD      . fluticasone furoate-vilanterol (BREO ELLIPTA) 100-25 MCG/INH 1 puff  1 puff  Inhalation Daily Lorella Nimrod, MD   1 puff at 07/15/20 0815  . HYDROcodone-acetaminophen (NORCO/VICODIN) 5-325 MG per tablet 1 tablet  1 tablet Oral Q6H PRN Lorella Nimrod, MD      . levothyroxine (SYNTHROID) tablet 112 mcg  112 mcg Oral Daily Lorella Nimrod, MD   112 mcg at 07/15/20 1610  . multivitamin (RENA-VIT) tablet 1 tablet  1 tablet Oral Daily Lorella Nimrod, MD      . ondansetron (ZOFRAN) tablet 4 mg  4 mg Oral Q6H PRN Lorella Nimrod, MD       Or  . ondansetron (ZOFRAN) injection 4 mg  4 mg Intravenous Q6H PRN Lorella Nimrod, MD      . pantoprazole (PROTONIX) injection 40 mg  40 mg Intravenous Q12H  Lorella Nimrod, MD   40 mg at 07/14/20 2259     ALLERGIES Amoxicillin, Elemental sulfur, Sulfur, Peg 3350-electrolytes, Sulfa drugs cross reactors, Miralax [polyethylene glycol], Percocet [oxycodone-acetaminophen], and Sulfa antibiotics  MEDICAL HISTORY Past Medical History:  Diagnosis Date  . Acute respiratory failure ([place]) 05/2017  . Anemia   . Anxiety   . Arthritis   . COPD (chronic obstructive pulmonary disease) ([place])   . Depression   . ESRD (end stage renal disease) ([place])    dialysis MWF  . GERD (gastroesophageal reflux disease)   . Gout   . History of blood transfusion   . History of diabetes mellitus    "diet controlled"  . HLD (hyperlipidemia)   . HOH (hard of hearing)    left ear  . Hypertension    hypotensive -since starting dialysis  . Hypothyroidism   . MRSA (methicillin resistant staph aureus) culture positive 06/01/2017  . PAF (paroxysmal atrial fibrillation) ([place])    a. Echo 11/16:  Mild LVH, EF 55-60%, normal wall motion, MAC, mild MR, severe LAE (49 ml/m2), mild RVE, normal RVSF, mild RAE, mild TR, PASP 24 mmHg;  CHADS2-VASc: 4 >> Coumadin followed by PCP     SOCIAL HISTORY Social History   Socioeconomic History  . Marital status: Widowed    Spouse name: Not on file  . Number of children: 6  . Years of education: 27  . Highest education level: 10th grade  Occupational History  . Occupation: RETIRED  Tobacco Use  . Smoking status: Former Smoker    Packs/day: 0.25    Years: 2.00    Pack years: 0.50    Types: Cigarettes    Quit date: 04/04/1998    Years since quitting: 22.2  . Smokeless tobacco: Never Used  Vaping Use  . Vaping Use: Never used  Substance and Sexual Activity  . Alcohol use: No    Alcohol/week: 0.0 standard drinks  . Drug use: No  . Sexual activity: Not Currently    Birth control/protection: None  Other Topics Concern  . Not on file  Social History Narrative   ** Merged History Encounter **       Widowed 6 children Lives  with son and daughter in law homemaker   Social Determinants of Health   Financial Resource Strain: Not on file  Food Insecurity: Not on file  Transportation Needs: Not on file  Physical Activity: Not on file  Stress: Not on file  Social Connections: Not on file  Intimate Partner Violence: Not on file     FAMILY HISTORY Family History  Problem Relation Age of Onset  . Arthritis Mother   . Diabetes Father        before age 23  .  Heart disease Father   . Diabetes Sister   . Cancer Brother   . Hyperlipidemia Daughter   . Hypertension Daughter   . Diabetes Daughter   . Hypertension Son   . Hyperlipidemia Son   . Dementia Sister   . Cancer Sister        Unknown  . Other Sister        Died in car accident  . Stroke Brother   . Diabetes Daughter   . Hypertension Daughter   . Hyperlipidemia Daughter   . Hepatitis C Son      Review of Systems: 12 systems were reviewed and negative except per HPI  Physical Exam: Vitals:   07/15/20 0430 07/15/20 0815  BP: (!) 143/85   Pulse: 81 79  Resp: 18 18  Temp: 98 F (36.7 C)   SpO2: 99% 100%   No intake/output data recorded.  Intake/Output Summary (Last 24 hours) at 07/15/2020 0831 Last data filed at 07/15/2020 0430 Gross per 24 hour  Intake 434 ml  Output --  Net 434 ml   General: well-appearing, no acute distress, laying flat in bed HEENT: anicteric sclera, MMM CV: normal rate, no murmurs, no edema Lungs: bilateral chest rise, normal wob Abd: soft, non-tender, non-distended Skin: no visible lesions or rashes Psych: alert, engaged, appropriate mood and affect Neuro: normal speech, no gross focal deficits  Access: lij tdc c/d/i  Test Results Reviewed Lab Results  Component Value Date   NA 141 07/15/2020   K 4.1 07/15/2020   CL 104 07/15/2020   CO2 26 07/15/2020   BUN 29 (H) 07/15/2020   CREATININE 4.93 (H) 07/15/2020   CALCIUM 7.8 (L) 07/15/2020   ALBUMIN 2.5 (L) 07/15/2020   PHOS 3.0 05/24/2017    I  have reviewed relevant outside healthcare records

## 2020-07-15 NOTE — Progress Notes (Signed)
Daily Rounding Note  07/15/2020, 12:05 PM  LOS: 1 day   SUBJECTIVE:   Chief complaint:  Painless hematochezia for ~ 2 weeks.    No bleeding or BM's overnight.  Feels fine.  Denies any rectal pain, swellling or sense that she has hemorrhoids.  In fact she is/was unaware of hemorrhoids.    OBJECTIVE:         Vital signs in last 24 hours:    Temp:  [98 F (36.7 C)-99 F (37.2 C)] 98.7 F (37.1 C) (04/13 0857) Pulse Rate:  [70-96] 85 (04/13 0857) Resp:  [13-20] 17 (04/13 0857) BP: (112-143)/(64-90) 142/90 (04/13 0857) SpO2:  [99 %-100 %] 99 % (04/13 0857) Last BM Date: 07/14/20 (per patient) There were no vitals filed for this visit. General: pleasant.  Comfortable.  Not ill looking   Heart: RRR Chest: clear bil.   Abdomen: soft, NT, ND.   Rectal: small, non-thrombosed hemorrhoids visible.  No blood, no lesions.  DRE not performed, only visual inspection.   Extremities: no CCE Neuro/Psych:  Alert, appropriate, calm, pleasant.    Intake/Output from previous day: 04/12 0701 - 04/13 0700 In: 434 [I.V.:20; Blood:414] Out: -   Intake/Output this shift: No intake/output data recorded.  Lab Results: Recent Labs    07/14/20 1357 07/15/20 0504  WBC  --  9.2  HGB 7.0 Repeated and verified X2.* 7.7*  HCT  --  24.3*  PLT  --  283   BMET Recent Labs    07/15/20 0504  NA 141  K 4.1  CL 104  CO2 26  GLUCOSE 71  BUN 29*  CREATININE 4.93*  CALCIUM 7.8*   LFT Recent Labs    07/15/20 0504  PROT 5.1*  ALBUMIN 2.5*  AST 23  ALT 15  ALKPHOS 75  BILITOT 0.7   PT/INR No results for input(s): LABPROT, INR in the last 72 hours. Hepatitis Panel No results for input(s): HEPBSAG, HCVAB, HEPAIGM, HEPBIGM in the last 72 hours.  Studies/Results: NM GI Blood Loss  Result Date: 07/14/2020 CLINICAL DATA:  Gastrointestinal hemorrhage, bright red blood per rectum EXAM: NUCLEAR MEDICINE GASTROINTESTINAL BLEEDING SCAN  TECHNIQUE: Sequential abdominal images were obtained following intravenous administration of Tc-17m labeled red blood cells. RADIOPHARMACEUTICALS:  26.2 mCi Tc-4m pertechnetate in-vitro labeled red cells. COMPARISON:  None. FINDINGS: Normal distribution of radiotracer within the vascular space, spleen, and liver. No evidence of active gastrointestinal hemorrhage identified. IMPRESSION: No active gastrointestinal hemorrhage. Electronically Signed   By: Fidela Salisbury MD   On: 07/14/2020 22:55    ASSESMENT:   *     Hematochezia for 2 weeks. 08/2018 colonoscopy revealed several tubular adenomatous polyps, nonbleeding hemorrhoids, diverticulosis at sigmoid/descending/transverse colon.  No active bleeding but suspected diverticular source for hematochezia. 08/2018 EGD with grade C esophagitis, small HH, nonbleeding erosive gastropathy. 07/14/2020 nuclear medicine RBC bleeding scan without active bleeding.  *    Anemia due to blood loss on top of anemia of chronic disease. Hgb 7 >> 7.7.  10.4 in mid February 2022, 9.5 in early September 2021. Anemia profile in mid February 2022 with no iron, B12, folate deficiency.  Had low TIBC.  Borderline low iron level at 31. Receives Mircera every 2 weeks, last dose 3/30.  Nephrology substituting with Aranesp.  *   ESRD.  On dialysis MWF.  *     A. fib on aspirin.   PLAN   *  Observe pt.  BID CBCs.  Azucena Freed  07/15/2020, 12:05 PM Phone [phone]

## 2020-07-15 NOTE — Evaluation (Signed)
Physical Therapy Evaluation Patient Details Name: [name] MRN: [id number] DOB: 05-Feb-1941 Today's Date: 07/15/2020   History of Present Illness  80 y.o. female was sent to hospital 07/14/20 from MD office after 2 weeks of passing blood with stool and Hgb 7.0. Medical history significant of DM, HOH, HTN, COPD, on oxygen at night, ESRD,  MW F, A. Fib-on aspirin, iron deficiency anemia  Clinical Impression   Pt admitted secondary to problem above with deficits below. PTA patient was walking inside home with RW and supervision. She uses wheelchair when she goes out of house.  Pt currently requires minguard assist for ambulating 25 ft with RW. Can benefit from short course of acute PT and then resume OPPT upon discharge. Will continue to follow acutely to maximize functional mobility independence and safety.       Follow Up Recommendations Outpatient PT (resume OPPT as PTA)    Equipment Recommendations  None recommended by PT    Recommendations for Other Services       Precautions / Restrictions Precautions Precautions: Fall Required Braces or Orthoses: Other Brace Other Brace: Rt ankle ASO; Lt AFO (double upright)      Mobility  Bed Mobility Overal bed mobility: Needs Assistance Bed Mobility: Supine to Sit     Supine to sit: Min assist;HOB elevated     General bed mobility comments: able to elevate torso, assist for scooting to EOB in sitting    Transfers Overall transfer level: Needs assistance Equipment used: Rolling walker (2 wheeled) Transfers: Sit to/from Stand Sit to Stand: Min guard         General transfer comment: from EOb and recliner  Ambulation/Gait Ambulation/Gait assistance: Min guard Gait Distance (Feet): 25 Feet Assistive device: Rolling walker (2 wheeled) Gait Pattern/deviations: Step-to pattern;Decreased step length - right;Decreased stance time - left;Decreased weight shift to left     General Gait Details: pt with very quick, short rt step;  tends to forget which foot she should lead with; tends to get an extra step in between moving the RW forward  Stairs            Wheelchair Mobility    Modified Rankin (Stroke Patients Only)       Balance Overall balance assessment: Mild deficits observed, not formally tested                                           Pertinent Vitals/Pain Pain Assessment: No/denies pain    Home Living Family/patient expects to be discharged to:: Private residence Living Arrangements: Children Available Help at Discharge: Family;Available 24 hours/day (daughter in law there) Type of Home: Mobile home Home Access: Ramped entrance     Home Layout: One level Home Equipment: Wheelchair - [employer] - 2 wheels;Bedside commode      Prior Function Level of Independence: Needs assistance   Gait / Transfers Assistance Needed: supervsision by DIL; uses RW inside home; w/c when goes out  ADL's / Homemaking Assistance Needed: DIL assists with bathing and dressing        Hand Dominance   Dominant Hand: Right    Extremity/Trunk Assessment   Upper Extremity Assessment Upper Extremity Assessment: Generalized weakness (overhead reaching not assessed)    Lower Extremity Assessment Lower Extremity Assessment: Generalized weakness    Cervical / Trunk Assessment Cervical / Trunk Assessment: Kyphotic  Communication   Communication: No difficulties  Cognition  Arousal/Alertness: Awake/alert Behavior During Therapy: WFL for tasks assessed/performed Overall Cognitive Status: Within Functional Limits for tasks assessed                                        General Comments      Exercises     Assessment/Plan    PT Assessment Patient needs continued PT services  PT Problem List Decreased strength;Decreased balance;Decreased mobility;Decreased knowledge of use of DME;Decreased safety awareness       PT Treatment Interventions DME instruction;Gait  training;Functional mobility training;Therapeutic activities;Therapeutic exercise;Balance training;Patient/family education    PT Goals (Current goals can be found in the Care Plan section)  Acute Rehab PT Goals Patient Stated Goal: to stay strong for going home PT Goal Formulation: With patient Time For Goal Achievement: 07/29/20 Potential to Achieve Goals: Good    Frequency Min 3X/week   Barriers to discharge        Co-evaluation               AM-PAC PT "6 Clicks" Mobility  Outcome Measure Help needed turning from your back to your side while in a flat bed without using bedrails?: None Help needed moving from lying on your back to sitting on the side of a flat bed without using bedrails?: A Little Help needed moving to and from a bed to a chair (including a wheelchair)?: A Little Help needed standing up from a chair using your arms (e.g., wheelchair or bedside chair)?: A Little Help needed to walk in hospital room?: A Little Help needed climbing 3-5 steps with a railing? : Total 6 Click Score: 17    End of Session Equipment Utilized During Treatment: Gait belt Activity Tolerance: Patient tolerated treatment well Patient left: in bed;Other (comment) (with transporter for HD)   PT Visit Diagnosis: History of falling (Z91.81);Muscle weakness (generalized) (M62.81);Difficulty in walking, not elsewhere classified (R26.2)    Time: 5427-0623 PT Time Calculation (min) (ACUTE ONLY): 32 min   Charges:   PT Evaluation $PT Eval Low Complexity: 1 Low PT Treatments $Gait Training: 8-22 mins         Arby Barrette, PT Pager [phone]   Rexanne Mano 07/15/2020, 11:39 AM

## 2020-07-15 NOTE — Progress Notes (Signed)
Patient back from dialysis at this time. NAD noted.

## 2020-07-16 LAB — BASIC METABOLIC PANEL
Anion gap: 8 (ref 5–15)
BUN: 13 mg/dL (ref 8–23)
CO2: 30 mmol/L (ref 22–32)
Calcium: 8.1 mg/dL — ABNORMAL LOW (ref 8.9–10.3)
Chloride: 102 mmol/L (ref 98–111)
Creatinine, Ser: 3.99 mg/dL — ABNORMAL HIGH (ref 0.44–1.00)
GFR, Estimated: 11 mL/min — ABNORMAL LOW (ref >60)
Glucose, Bld: 84 mg/dL (ref 70–99)
Potassium: 3.5 mmol/L (ref 3.5–5.1)
Sodium: 140 mmol/L (ref 135–145)

## 2020-07-16 LAB — TYPE AND SCREEN
ABO/RH(D): O POS
Antibody Screen: NEGATIVE
Unit division: 0

## 2020-07-16 LAB — CBC
HCT: 25.2 % — ABNORMAL LOW (ref 36.0–46.0)
HCT: 29.7 % — ABNORMAL LOW (ref 36.0–46.0)
Hemoglobin: 7.9 g/dL — ABNORMAL LOW (ref 12.0–15.0)
Hemoglobin: 9.2 g/dL — ABNORMAL LOW (ref 12.0–15.0)
MCH: 30.4 pg (ref 26.0–34.0)
MCH: 30.5 pg (ref 26.0–34.0)
MCHC: 31.3 g/dL (ref 30.0–36.0)
MCHC: 31 g/dL (ref 30.0–36.0)
MCV: 96.9 fL (ref 80.0–100.0)
MCV: 98.3 fL (ref 80.0–100.0)
Platelets: 274 10*3/uL (ref 150–400)
Platelets: 345 10*3/uL (ref 150–400)
RBC: 2.6 MIL/uL — ABNORMAL LOW (ref 3.87–5.11)
RBC: 3.02 MIL/uL — ABNORMAL LOW (ref 3.87–5.11)
RDW: 17.1 % — ABNORMAL HIGH (ref 11.5–15.5)
RDW: 16.8 % — ABNORMAL HIGH (ref 11.5–15.5)
WBC: 8.2 10*3/uL (ref 4.0–10.5)
WBC: 11.1 10*3/uL — ABNORMAL HIGH (ref 4.0–10.5)
nRBC: 0 % (ref 0.0–0.2)
nRBC: 0 % (ref 0.0–0.2)

## 2020-07-16 LAB — BPAM RBC
Blood Product Expiration Date: 202205102359
ISSUE DATE / TIME: 202204130052
Unit Type and Rh: 5100

## 2020-07-16 NOTE — Progress Notes (Signed)
PROGRESS NOTE    [name]  TKZ:[id number] DOB: 1940-06-07 DOA: 07/14/2020 PCP: [name], FNP   Brief Narrative:  [name] is a 80 y.o. female with medical history significant of COPD, on oxygen at night, ESRD,  MW F, A. Fib-on aspirin for that, iron deficiency anemia was sent to hospital as a direct admit from her gastroenterologist office where she was presented with bright red blood per rectum going on for about 2 weeks. Patient had similar symptoms intermittently for the past 2 years and had EGDs and colonoscopies.  She had no other complaint. She was seen by her PCP recently and her hemoglobin was 8, Hemoccult test was positive and they advised transfusion but her dialysis center refused transfusion unless hemoglobin is below 7.    On 07/14/2020, at GI office her hemoglobin was 7.  Baseline around 10.  She was sent for direct admission.  She was hemodynamically stable upon presentation.  After admission with hospitalist, she received 1 unit of PRBC transfusion.  Her hemoglobin improved to 7.6.  She did not have any further episodes of rectal bleeding.  She was seen by GI however they did not recommend any further colonoscopy.  Assessment & Plan:   Active Problems:   ESRD (end stage renal disease) on dialysis (HCC)   Gastroesophageal reflux disease without esophagitis   Acute blood loss anemia   COPD (chronic obstructive pulmonary disease) (HCC)   Lower GI bleed   Acute blood loss anemia secondary to lower GI bleed/acute on chronic symptomatic anemia: Reportedly patient's hemoglobin was down to 7 in the GI clinic that was all the way down from 10 few days ago.  Reports history of 2 weeks of hematochezia, at least 4/day.  Was sent from GI clinic for direct admission.  Status post 1 unit PRBC transfusion on 07/14/2020.  Hemoglobin 7.9 today.  No further episodes of rectal bleeding.  Continue famotidine.  Seen by GI and due to recurrent issue with rectal bleeding, they recommend  observation for another night.  Patient and her DIL in agreement.  ESRD.  Patient gets dialysis on Monday, Wednesday and Friday. Nephrology on board.  History of A. fib.  Not on any rate controlling medicine at home. Just take aspirin. -Hold aspirin because of GI bleed.  History of COPD.  Not in acute exacerbation. -Continue home bronchodilators. -Continuous supplemental oxygen.  DVT prophylaxis: SCDs Start: 07/14/20 1725   Code Status: DNR  Family Communication: Daughter IL at bedside.  Plan of care discussed with patient and her daughter in length and he verbalized understanding and agreed with it.  Status is: Inpatient  Remains inpatient appropriate because:Ongoing diagnostic testing needed not appropriate for outpatient work up   Dispo: The patient is from: Home              Anticipated d/c is to: Home              Patient currently is not medically stable to d/c.   Difficult to place patient No        Estimated body mass index is 26.81 kg/m as calculated from the following:   Height as of an earlier encounter on 07/14/20: 5\' 2"  (1.575 m).   Weight as of this encounter: 66.5 kg.      Nutritional status:               Consultants:   GI  Nephrology  Procedures:   None  Antimicrobials:  Anti-infectives (From admission,  onward)   None         Subjective: Seen and examined.  Patient feels better and no complaints.  Daughter-in-law at the bedside.  Objective: Vitals:   07/15/20 1606 07/15/20 2200 07/16/20 0608 07/16/20 1109  BP: (!) 148/92 112/68 (!) 149/92   Pulse: 99 77 79   Resp: 18 17 11    Temp: 98.9 F (37.2 C) 98.2 F (36.8 C) 98.2 F (36.8 C)   TempSrc: Oral Oral Oral   SpO2: 100% 100% 100% 99%  Weight:        Intake/Output Summary (Last 24 hours) at 07/16/2020 1332 Last data filed at 07/15/2020 1550 Gross per 24 hour  Intake --  Output 2000 ml  Net -2000 ml   Filed Weights   07/15/20 1130  Weight: 66.5 kg     Examination: General exam: Appears calm and comfortable  Respiratory system: Clear to auscultation. Respiratory effort normal. Cardiovascular system: S1 & S2 heard, RRR. No JVD, murmurs, rubs, gallops or clicks. No pedal edema. Gastrointestinal system: Abdomen is nondistended, soft and nontender. No organomegaly or masses felt. Normal bowel sounds heard. Central nervous system: Alert and oriented. No focal neurological deficits. Extremities: Symmetric 5 x 5 power. Skin: No rashes, lesions or ulcers.  Psychiatry: Judgement and insight appear normal. Mood & affect appropriate.   Data Reviewed: I have personally reviewed following labs and imaging studies  CBC: Recent Labs  Lab 07/14/20 1357 07/15/20 0504 07/15/20 1615 07/16/20 0444  WBC  --  9.2 10.9* 8.2  HGB 7.0 Repeated and verified X2.* 7.7* 8.5* 7.9*  HCT  --  24.3* 27.0* 25.2*  MCV  --  96.4 95.1 96.9  PLT  --  283 278 676   Basic Metabolic Panel: Recent Labs  Lab 07/15/20 0504 07/16/20 0444  NA 141 140  K 4.1 3.5  CL 104 102  CO2 26 30  GLUCOSE 71 84  BUN 29* 13  CREATININE 4.93* 3.99*  CALCIUM 7.8* 8.1*   GFR: Estimated Creatinine Clearance: 10.1 mL/min (A) (by C-G formula based on SCr of 3.99 mg/dL (H)). Liver Function Tests: Recent Labs  Lab 07/15/20 0504  AST 23  ALT 15  ALKPHOS 75  BILITOT 0.7  PROT 5.1*  ALBUMIN 2.5*   No results for input(s): LIPASE, AMYLASE in the last 168 hours. No results for input(s): AMMONIA in the last 168 hours. Coagulation Profile: No results for input(s): INR, PROTIME in the last 168 hours. Cardiac Enzymes: No results for input(s): CKTOTAL, CKMB, CKMBINDEX, TROPONINI in the last 168 hours. BNP (last 3 results) No results for input(s): PROBNP in the last 8760 hours. HbA1C: No results for input(s): HGBA1C in the last 72 hours. CBG: No results for input(s): GLUCAP in the last 168 hours. Lipid Profile: No results for input(s): CHOL, HDL, LDLCALC, TRIG, CHOLHDL,  LDLDIRECT in the last 72 hours. Thyroid Function Tests: No results for input(s): TSH, T4TOTAL, FREET4, T3FREE, THYROIDAB in the last 72 hours. Anemia Panel: No results for input(s): VITAMINB12, FOLATE, FERRITIN, TIBC, IRON, RETICCTPCT in the last 72 hours. Sepsis Labs: No results for input(s): PROCALCITON, LATICACIDVEN in the last 168 hours.  Recent Results (from the past 240 hour(s))  Fecal occult blood, imunochemical     Status: Abnormal   Collection Time: 07/08/20 11:03 AM   Specimen: Stool   ST  Result Value Ref Range Status   Fecal Occult Bld Positive (A) Negative Final  SARS CORONAVIRUS 2 (TAT 6-24 HRS) Nasopharyngeal Nasopharyngeal Swab     Status:  None   Collection Time: 07/14/20  5:31 PM   Specimen: Nasopharyngeal Swab  Result Value Ref Range Status   SARS Coronavirus 2 NEGATIVE NEGATIVE Final    Comment: (NOTE) SARS-CoV-2 target nucleic acids are NOT DETECTED.  The SARS-CoV-2 RNA is generally detectable in upper and lower respiratory specimens during the acute phase of infection. Negative results do not preclude SARS-CoV-2 infection, do not rule out co-infections with other pathogens, and should not be used as the sole basis for treatment or other patient management decisions. Negative results must be combined with clinical observations, patient history, and epidemiological information. The expected result is Negative.  Fact Sheet for Patients: SugarRoll.be  Fact Sheet for Healthcare Providers: https://www.woods-mathews.com/  This test is not yet approved or cleared by the [place] FDA and  has been authorized for detection and/or diagnosis of SARS-CoV-2 by FDA under an Emergency Use Authorization (EUA). This EUA will remain  in effect (meaning this test can be used) for the duration of the COVID-19 declaration under Se ction 564(b)(1) of the Act, 21 U.S.C. section 360bbb-3(b)(1), unless the authorization is terminated  or revoked sooner.  Performed at [facility], [place] [address] [place], [place] [address]   MRSA PCR Screening     Status: Abnormal   Collection Time: 07/15/20 10:28 AM   Specimen: Nasopharyngeal  Result Value Ref Range Status   MRSA by PCR POSITIVE (A) NEGATIVE Final    Comment:        The GeneXpert MRSA Assay (FDA approved for NASAL specimens only), is one component of a comprehensive MRSA colonization surveillance program. It is not intended to diagnose MRSA infection nor to guide or monitor treatment for MRSA infections. CRITICAL RESULT CALLED TO, READ BACK BY AND VERIFIED WITH: RN Alphia Moh AT 2353 ON 07/15/20 BY KJ Performed at [facility], [place] [address] [place],  [address]       Radiology Studies: NM GI Blood Loss  Result Date: 07/14/2020 CLINICAL DATA:  Gastrointestinal hemorrhage, bright red blood per rectum EXAM: NUCLEAR MEDICINE GASTROINTESTINAL BLEEDING SCAN TECHNIQUE: Sequential abdominal images were obtained following intravenous administration of Tc-61m labeled red blood cells. RADIOPHARMACEUTICALS:  26.2 mCi Tc-36m pertechnetate in-vitro labeled red cells. COMPARISON:  None. FINDINGS: Normal distribution of radiotracer within the vascular space, spleen, and liver. No evidence of active gastrointestinal hemorrhage identified. IMPRESSION: No active gastrointestinal hemorrhage. Electronically Signed   By: Fidela Salisbury MD   On: 07/14/2020 22:55    Scheduled Meds: . sodium chloride   Intravenous Once  . Chlorhexidine Gluconate Cloth  6 each Topical Daily  . Chlorhexidine Gluconate Cloth  6 each Topical Q0600  . cinacalcet  30 mg Oral Q M,W,F  . darbepoetin (ARANESP) injection - DIALYSIS  100 mcg Intravenous Q Wed-HD  . doxercalciferol  1 mcg Intravenous Q M,W,F-HD  . ezetimibe  10 mg Oral Daily  . famotidine  20 mg Oral Daily  . fluticasone furoate-vilanterol  1 puff Inhalation Daily  . levothyroxine  112 mcg Oral Daily  . multivitamin   1 tablet Oral Daily  . mupirocin ointment  1 application Nasal BID  . pramipexole  0.125 mg Oral QHS   Continuous Infusions:   LOS: 2 days   Time spent: 30 minutes   Darliss Cheney, MD [facility]  07/16/2020, 1:32 PM   To contact the attending provider between 7A-7P or the covering provider during after hours 7P-7A, please log into the web site [email or web address].

## 2020-07-16 NOTE — Progress Notes (Signed)
Physical Therapy Treatment Patient Details Name: [name] MRN: [id number] DOB: 13-Sep-1940 Today's Date: 07/16/2020    History of Present Illness 80 y.o. female was sent to hospital 07/14/20 from MD office after 2 weeks of passing blood with stool and Hgb 7.0. Medical history significant of DM, HOH, HTN, COPD, on oxygen at night, ESRD,  MW F, A. Fib-on aspirin, iron deficiency anemia    PT Comments    Patient eager to move and keep her strength up. She requires assist to don her rt ASO and left AFO with her shoes prior to ambulation. She continues to require cues for sequencing with weaker leg first and when she forgets she has some difficulty advancing LLE coming forward from behind her. This occasionally causes a minor imbalance that she corrects with bil UEs on RW, however emphasized why her sequencing is important.     Follow Up Recommendations  Home health PT (per discussion, pt had been discharged from [facility] ~1 month ago. She is not strong enough to go from house to therapy and then participate in therapy.)     Equipment Recommendations  None recommended by PT    Recommendations for Other Services       Precautions / Restrictions Precautions Precautions: Fall Required Braces or Orthoses: Other Brace Other Brace: Rt ankle ASO; Lt AFO (double upright)    Mobility  Bed Mobility                    Transfers Overall transfer level: Needs assistance Equipment used: Rolling walker (2 wheeled) Transfers: Sit to/from Stand Sit to Stand: Min guard         General transfer comment: from recliner  Ambulation/Gait Ambulation/Gait assistance: Min guard Gait Distance (Feet): 50 Feet Assistive device: Rolling walker (2 wheeled) Gait Pattern/deviations: Step-to pattern;Decreased step length - right;Decreased stance time - left;Decreased weight shift to left     General Gait Details: pt with very quick, short rt step; tends to forget which foot she should lead with;  tends to get an extra step in between moving the RW forward   Stairs             Wheelchair Mobility    Modified Rankin (Stroke Patients Only)       Balance Overall balance assessment: Mild deficits observed, not formally tested;History of Falls                                          Cognition Arousal/Alertness: Awake/alert Behavior During Therapy: WFL for tasks assessed/performed Overall Cognitive Status: Within Functional Limits for tasks assessed                                 General Comments: able to guide PT in donning her two braces and additional padding she uses on medial malleolus      Exercises      General Comments General comments (skin integrity, edema, etc.): Patient eager to ambulate and maintain her strength      Pertinent Vitals/Pain Pain Assessment: No/denies pain    Home Living                      Prior Function            PT Goals (current goals can now be found in  the care plan section) Acute Rehab PT Goals Patient Stated Goal: to stay strong for going home Time For Goal Achievement: 07/29/20 Potential to Achieve Goals: Good Progress towards PT goals: Progressing toward goals    Frequency    Min 3X/week      PT Plan Discharge plan needs to be updated    Co-evaluation              AM-PAC PT "6 Clicks" Mobility   Outcome Measure  Help needed turning from your back to your side while in a flat bed without using bedrails?: None Help needed moving from lying on your back to sitting on the side of a flat bed without using bedrails?: A Little Help needed moving to and from a bed to a chair (including a wheelchair)?: A Little Help needed standing up from a chair using your arms (e.g., wheelchair or bedside chair)?: A Little Help needed to walk in hospital room?: A Little Help needed climbing 3-5 steps with a railing? : Total 6 Click Score: 17    End of Session Equipment  Utilized During Treatment: Gait belt Activity Tolerance: Patient tolerated treatment well Patient left: in chair;with call bell/phone within reach;with chair alarm set   PT Visit Diagnosis: History of falling (Z91.81);Muscle weakness (generalized) (M62.81);Difficulty in walking, not elsewhere classified (R26.2)     Time: 1216-2446 PT Time Calculation (min) (ACUTE ONLY): 22 min  Charges:  $Gait Training: 8-22 mins                      Arby Barrette, PT Pager [phone]    Rexanne Mano 07/16/2020, 4:07 PM

## 2020-07-16 NOTE — Progress Notes (Signed)
Physcial Therapy Update    07/16/20 1039   PT - Assessment/Plan  PT Plan Discharge plan needs to be updated  PT Visit Diagnosis History of falling (Z91.81);Muscle weakness (generalized) (M62.81);Difficulty in walking, not elsewhere classified (R26.2)  PT Frequency (ACUTE ONLY) Min 3X/week  Follow Up Recommendations Home health PT (per discussion, pt had been discharged from OPPT ~1 month ago. She is not strong enough to go from house to therapy and then participate in therapy.)  PT equipment None recommended by PT    Contacted by OT that daughter-in-law was present and requesting [place] therapies, not OPPT. Unable to work with pt at this time (second attempt), however able to discuss with family (see rationale above).   Arby Barrette, PT Pager [phone]

## 2020-07-16 NOTE — Progress Notes (Signed)
Daily Rounding Note  07/16/2020, 9:24 AM  LOS: 2 days   SUBJECTIVE:   Chief complaint:   Painless hematochezia.   Presumed diverticular bleed.  Blood loss anemia  Had bowel movement last night.  Was not reported to oncoming staff as bloody.  Patient herself is not aware of what the stool looks like.  OBJECTIVE:         Vital signs in last 24 hours:    Temp:  [98.1 F (36.7 C)-98.9 F (37.2 C)] 98.2 F (36.8 C) (04/14 8119) Pulse Rate:  [60-99] 79 (04/14 0608) Resp:  [11-24] 11 (04/14 0608) BP: (112-149)/(68-110) 149/92 (04/14 0608) SpO2:  [100 %] 100 % (04/14 0608) Weight:  [66.5 kg] 66.5 kg (04/13 1130) Last BM Date: 07/14/20 (per patient) Filed Weights   07/15/20 1130  Weight: 66.5 kg   General: Pleasant, elderly, comfortable.  Does not look acutely ill. Heart: RRR. Chest: Excellent breath sounds.  Dry crackles in the bases more pronounced on left.  No labored breathing or cough Abdomen: Soft.  Nontender.  Active bowel sounds.  No distention. Extremities: No CCE. Neuro/Psych: Pleasant, calm, relaxed.  Intake/Output from previous day: 04/13 0701 - 04/14 0700 In: -  Out: 2000   Intake/Output this shift: No intake/output data recorded.  Lab Results: Recent Labs    07/15/20 0504 07/15/20 1615 07/16/20 0444  WBC 9.2 10.9* 8.2  HGB 7.7* 8.5* 7.9*  HCT 24.3* 27.0* 25.2*  PLT 283 278 274   BMET Recent Labs    07/15/20 0504 07/16/20 0444  NA 141 140  K 4.1 3.5  CL 104 102  CO2 26 30  GLUCOSE 71 84  BUN 29* 13  CREATININE 4.93* 3.99*  CALCIUM 7.8* 8.1*   LFT Recent Labs    07/15/20 0504  PROT 5.1*  ALBUMIN 2.5*  AST 23  ALT 15  ALKPHOS 75  BILITOT 0.7   PT/INR No results for input(s): LABPROT, INR in the last 72 hours. Hepatitis Panel Recent Labs    07/15/20 1326  HEPBSAG NON REACTIVE    Studies/Results: NM GI Blood Loss  Result Date: 07/14/2020 CLINICAL DATA:   Gastrointestinal hemorrhage, bright red blood per rectum EXAM: NUCLEAR MEDICINE GASTROINTESTINAL BLEEDING SCAN TECHNIQUE: Sequential abdominal images were obtained following intravenous administration of Tc-27m labeled red blood cells. RADIOPHARMACEUTICALS:  26.2 mCi Tc-59m pertechnetate in-vitro labeled red cells. COMPARISON:  None. FINDINGS: Normal distribution of radiotracer within the vascular space, spleen, and liver. No evidence of active gastrointestinal hemorrhage identified. IMPRESSION: No active gastrointestinal hemorrhage. Electronically Signed   By: Fidela Salisbury MD   On: 07/14/2020 22:55   Scheduled Meds: . sodium chloride   Intravenous Once  . Chlorhexidine Gluconate Cloth  6 each Topical Daily  . Chlorhexidine Gluconate Cloth  6 each Topical Q0600  . cinacalcet  30 mg Oral Q M,W,F  . darbepoetin (ARANESP) injection - DIALYSIS  100 mcg Intravenous Q Wed-HD  . doxercalciferol  1 mcg Intravenous Q M,W,F-HD  . ezetimibe  10 mg Oral Daily  . famotidine  20 mg Oral Daily  . fluticasone furoate-vilanterol  1 puff Inhalation Daily  . levothyroxine  112 mcg Oral Daily  . multivitamin  1 tablet Oral Daily  . mupirocin ointment  1 application Nasal BID  . pramipexole  0.125 mg Oral QHS   Continuous Infusions: PRN Meds:.acetaminophen **OR** acetaminophen, albuterol, HYDROcodone-acetaminophen, ondansetron **OR** ondansetron (ZOFRAN) IV   ASSESMENT:   *   Painless hematochezia, prolonged  2-week course PTA.  Presumed diverticular bleed. Tagged RBC scan 4/12 without active bleeding. 08/2018 colonoscopy with tubular adenomatous polyps, nonbleeding hemorrhoids, diverticulosis, no active bleeding but presumed to have had diverticular bleed at that time.  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   Hgb 7 >> 1 PRBC >> 8.5 >> 7.9.  *   ESRD.  Hemodialysis MWF.   PLAN   *    If in the protracted nature of hematochezia coming into the hospital, would suggest keeping patient 1 more night in the  hospital, rechecking blood counts in the morning.    [name]  07/16/2020, 9:24 AM Phone [phone]

## 2020-07-16 NOTE — Plan of Care (Signed)

## 2020-07-16 NOTE — Progress Notes (Signed)
Occupational Therapy Evaluation Patient Details Name: [name] MRN: [id number] DOB: 1940/11/06 Today's Date: 07/16/2020    History of Present Illness 80 y.o. female was sent to hospital 07/14/20 from MD office after 2 weeks of passing blood with stool and Hgb 7.0. Medical history significant of DM, HOH, HTN, COPD, on oxygen at night, ESRD,  MW F, A. Fib-on aspirin, iron deficiency anemia   Clinical Impression   PTA pt lives at home with her family who assist with ADL tasks as needed and supervise mobility @ RW level. Pt has required more assistance since her fall in August of last year and would like to be more independent. Pt able to mobilize with min A and daughter in law is able to assist at this level. BP 175/110 - nsg notified. Pt would benefit from [facility] to maximize functional level of independence and reduce risk of falls. Pt/daughter in law agreeable to [facility] services. Will follow acutely to facilitate safe DC home.     Follow Up Recommendations  Home health OT;Supervision/Assistance - 24 hour    Equipment Recommendations  None recommended by OT    Recommendations for Other Services       Precautions / Restrictions Precautions Precautions: Fall Required Braces or Orthoses: Other Brace Other Brace: Rt ankle ASO; Lt AFO (double upright)      Mobility Bed Mobility Overal bed mobility: Needs Assistance;Modified Independent Bed Mobility: Supine to Sit     Supine to sit: HOB elevated;Supervision          Transfers Overall transfer level: Needs assistance Equipment used: Rolling walker (2 wheeled) Transfers: Sit to/from Stand Sit to Stand: Min guard              Balance Overall balance assessment: Mild deficits observed, not formally tested;History of Falls                                         ADL either performed or assessed with clinical judgement   ADL Overall ADL's : Needs assistance/impaired     Grooming: Set up;Sitting   Upper  Body Bathing: Set up;Sitting   Lower Body Bathing: Minimal assistance;Sit to/from stand   Upper Body Dressing : Set up;Sitting   Lower Body Dressing: Moderate assistance;Sit to/from stand   Toilet Transfer: Minimal assistance;RW;BSC   Toileting- Clothing Manipulation and Hygiene: Minimal assistance       Functional mobility during ADLs: Minimal assistance;Rolling walker       Vision Baseline Vision/History: Wears glasses       Perception     Praxis      Pertinent Vitals/Pain Pain Assessment: Faces Faces Pain Scale: Hurts a little bit Pain Location: headache Pain Descriptors / Indicators: Headache Pain Intervention(s): Limited activity within patient's tolerance     Hand Dominance Right   Extremity/Trunk Assessment Upper Extremity Assessment Upper Extremity Assessment: Generalized weakness   Lower Extremity Assessment Lower Extremity Assessment: Defer to PT evaluation   Cervical / Trunk Assessment Cervical / Trunk Assessment: Kyphotic   Communication Communication Communication: No difficulties   Cognition Arousal/Alertness: Awake/alert Behavior During Therapy: WFL for tasks assessed/performed Overall Cognitive Status: Within Functional Limits for tasks assessed                                     General Comments   Wears  O2 at night    Exercises     Shoulder Instructions      Home Living Family/patient expects to be discharged to:: Private residence Living Arrangements: Children Available Help at Discharge: Family;Available 24 hours/day (daughter in law there) Type of Home: Mobile home Home Access: Ramped entrance     Home Layout: One level     Bathroom Shower/Tub: Other (comment) (washes at sink)   Bathroom Toilet: Handicapped height Bathroom Accessibility: Yes How Accessible: Accessible via walker Home Equipment: Wheelchair - manual;Walker - 2 wheels;Bedside commode;Cane - single point;Shower seat;Other (comment) Harrel Lemon;  lift chair)          Prior Functioning/Environment Level of Independence: Needs assistance  Gait / Transfers Assistance Needed: supervsision by DIL; uses RW inside home; w/c when goes out ADL's / Homemaking Assistance Needed: daughter in law assists with bathing/dressing as needed            OT Problem List: Decreased strength;Decreased activity tolerance;Impaired balance (sitting and/or standing);Decreased safety awareness;Decreased knowledge of use of DME or AE;Cardiopulmonary status limiting activity;Pain      OT Treatment/Interventions: Self-care/ADL training;Energy conservation;DME and/or AE instruction;Therapeutic activities;Patient/family education;Balance training;Therapeutic exercise    OT Goals(Current goals can be found in the care plan section) Acute Rehab OT Goals Patient Stated Goal: to stay strong for going home OT Goal Formulation: With patient/family Time For Goal Achievement: 07/30/20 Potential to Achieve Goals: Good  OT Frequency: Min 2X/week   Barriers to D/C:            Co-evaluation              AM-PAC OT "6 Clicks" Daily Activity     Outcome Measure Help from another person eating meals?: None Help from another person taking care of personal grooming?: A Little Help from another person toileting, which includes using toliet, bedpan, or urinal?: A Little Help from another person bathing (including washing, rinsing, drying)?: A Little Help from another person to put on and taking off regular upper body clothing?: A Little Help from another person to put on and taking off regular lower body clothing?: A Lot 6 Click Score: 18   End of Session Equipment Utilized During Treatment: Gait belt;Rolling walker Nurse Communication: Mobility status  Activity Tolerance: Patient tolerated treatment well Patient left: in chair;with call bell/phone within reach;with chair alarm set  OT Visit Diagnosis: Unsteadiness on feet (R26.81);Muscle weakness  (generalized) (M62.81);History of falling (Z91.81);Pain Pain - part of body:  (Headache)                Time: 1941-7408 OT Time Calculation (min): 24 min Charges:  OT General Charges $OT Visit: 1 Visit OT Evaluation $OT Eval Low Complexity: [place], OT/L   Acute OT Clinical Specialist Acute Rehabilitation Services Pager [phone] Office [phone]   [facility] 07/16/2020, 10:36 AM

## 2020-07-16 NOTE — TOC Transition Note (Addendum)
Transition of Care [facility]) - CM/SW Discharge Note   Patient Details  Name: FREDERICA CHRESTMAN MRN: [id number] Date of Birth: June 29, 1940  Transition of Care [facility]) CM/SW Contact:  Bethann Berkshire, [place] Phone Number: 07/16/2020, 2:48 PM   Clinical Narrative:     CSW met with pt for [facility] consult. Pt lives in [name], [place] with her Daughter and son in law. She states she needs HH PT/OT. Pt has DME including walker, 3 in 1, lift, ramp. She has no preference on [place] as she has struggled to get [facility] in the past because of the area she lives in. She states "just whatever you can get."   CSW called Reece Levy [facility] [phone] who stated they would look at referral. CSW faxed referral to [facility] at [phone]. [facility] cannot accept pt due to staffing [facility] cannot accept at this time [facility] accept at this time      Barriers to Discharge: Continued Medical Work up   Patient Goals and CMS Choice Patient states their goals for this hospitalization and ongoing recovery are:: Home with Home health   Choice offered to / list presented to : Patient  Discharge Placement                       Discharge Plan and Services     Post Acute Care Choice: Home Health                               Social Determinants of Health (SDOH) Interventions     Readmission Risk Interventions No flowsheet data found.

## 2020-07-16 NOTE — Progress Notes (Signed)
[facility] Progress Note    Assessment/ Plan:   ESRD:  Outpatient orders: MWF/[facility]. 3.5 hours, F160, 400/500, 2K, 2.5ca, uf profile 2. 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 Supportive care and observation as per GI  Volume/ hypertension: EDW 64.5kg. Attempt to achieve EDW as tolerated  Anemia of Chronic Kidney Disease: due for ESA, receives mircera q2weeks, last dose 3/30 (67mcg), started aranesp with an increase in equivalent dose: 139mcg qwed, started 4/13  Secondary Hyperparathyroidism/Hyperphosphatemia: on auryxia but not on formulary here, monitor phos and restrict phos in diet  Afib -asa on hold, rate controlled currently, per primary   COPD -on supplemental o2, per primary  Vascular access: LIJ TDC. Catheter dependent  # Additional recommendations: - Dose all meds for creatinine clearance <10 ml/min  - Unless absolutely necessary, no MRIs with gadolinium.  - Implement save arm precautions. Prefer needle sticks in the dorsum of the hands or wrists. No blood pressure measurements in arm. - If blood transfusion is requested during hemodialysis sessions, please alert [place] prior to the session.  - If a hemodialysis catheter line culture is requested, please alert [place] as only hemodialysis nurses are able to collect those specimens.   Subjective:   No acute events, tolerated HD yesterday, net uf 2L, she reports that she has not noticed any blood in her stools. No other complaints.   Objective:   BP (!) 149/92 (BP Location: Right Arm)   Pulse 79   Temp 98.2 F (36.8 C) (Oral)   Resp 11   Wt 66.5 kg   SpO2 100%   BMI 26.81 kg/m   Intake/Output Summary (Last 24 hours) at 07/16/2020 0933 Last data filed at 07/15/2020 1550 Gross per 24 hour  Intake --  Output 2000 ml  Net -2000 ml   Weight change:   Physical Exam: Gen:nad CVS:s1s2 Resp:cta  bl LEX:NTZG, nt/nd Ext:no edema Neuro: alert, awake, speech clear and coherent Dialysis access: [facility]  Imaging: NM GI Blood Loss  Result Date: 07/14/2020 CLINICAL DATA:  Gastrointestinal hemorrhage, bright red blood per rectum EXAM: NUCLEAR MEDICINE GASTROINTESTINAL BLEEDING SCAN TECHNIQUE: Sequential abdominal images were obtained following intravenous administration of Tc-67m labeled red blood cells. RADIOPHARMACEUTICALS:  26.2 mCi Tc-63m pertechnetate in-vitro labeled red cells. COMPARISON:  None. FINDINGS: Normal distribution of radiotracer within the vascular space, spleen, and liver. No evidence of active gastrointestinal hemorrhage identified. IMPRESSION: No active gastrointestinal hemorrhage. Electronically Signed   By: Fidela Salisbury MD   On: 07/14/2020 22:55    Labs: BMET Recent Labs  Lab 07/15/20 0504 07/16/20 0444  NA 141 140  K 4.1 3.5  CL 104 102  CO2 26 30  GLUCOSE 71 84  BUN 29* 13  CREATININE 4.93* 3.99*  CALCIUM 7.8* 8.1*   CBC Recent Labs  Lab 07/14/20 1357 07/15/20 0504 07/15/20 1615 07/16/20 0444  WBC  --  9.2 10.9* 8.2  HGB 7.0 Repeated and verified X2.* 7.7* 8.5* 7.9*  HCT  --  24.3* 27.0* 25.2*  MCV  --  96.4 95.1 96.9  PLT  --  283 278 274    Medications:    . sodium chloride   Intravenous Once  . Chlorhexidine Gluconate Cloth  6 each Topical Daily  . Chlorhexidine Gluconate Cloth  6 each Topical Q0600  . cinacalcet  30 mg Oral Q M,W,F  . darbepoetin (ARANESP) injection - DIALYSIS  100 mcg Intravenous  Q Wed-HD  . doxercalciferol  1 mcg Intravenous Q M,W,F-HD  . ezetimibe  10 mg Oral Daily  . famotidine  20 mg Oral Daily  . fluticasone furoate-vilanterol  1 puff Inhalation Daily  . levothyroxine  112 mcg Oral Daily  . multivitamin  1 tablet Oral Daily  . mupirocin ointment  1 application Nasal BID  . pramipexole  0.125 mg Oral QHS      Gean Quint, MD [facility] 07/16/2020, 9:33 AM

## 2020-07-17 LAB — CBC
HCT: 25.7 % — ABNORMAL LOW (ref 36.0–46.0)
Hemoglobin: 8.3 g/dL — ABNORMAL LOW (ref 12.0–15.0)
MCH: 31 pg (ref 26.0–34.0)
MCHC: 32.3 g/dL (ref 30.0–36.0)
MCV: 95.9 fL (ref 80.0–100.0)
Platelets: 318 10*3/uL (ref 150–400)
RBC: 2.68 MIL/uL — ABNORMAL LOW (ref 3.87–5.11)
RDW: 16.5 % — ABNORMAL HIGH (ref 11.5–15.5)
WBC: 9.8 10*3/uL (ref 4.0–10.5)
nRBC: 0 % (ref 0.0–0.2)

## 2020-07-17 LAB — BASIC METABOLIC PANEL
Anion gap: 10 (ref 5–15)
BUN: 26 mg/dL — ABNORMAL HIGH (ref 8–23)
CO2: 27 mmol/L (ref 22–32)
Calcium: 8.2 mg/dL — ABNORMAL LOW (ref 8.9–10.3)
Chloride: 102 mmol/L (ref 98–111)
Creatinine, Ser: 5.75 mg/dL — ABNORMAL HIGH (ref 0.44–1.00)
GFR, Estimated: 7 mL/min — ABNORMAL LOW (ref >60)
Glucose, Bld: 88 mg/dL (ref 70–99)
Potassium: 4.5 mmol/L (ref 3.5–5.1)
Sodium: 139 mmol/L (ref 135–145)

## 2020-07-17 MED ORDER — DOXERCALCIFEROL 4 MCG/2ML IV SOLN
INTRAVENOUS | Status: AC
  Filled 2020-07-17: qty 2

## 2020-07-17 MED ORDER — HEPARIN SODIUM (PORCINE) 1000 UNIT/ML IJ SOLN
INTRAMUSCULAR | Status: AC
  Filled 2020-07-17: qty 4

## 2020-07-17 MED ORDER — HEPARIN SODIUM (PORCINE) 1000 UNIT/ML IJ SOLN
3800.0000 [IU] | Freq: Once | INTRAMUSCULAR | Status: AC
  Administered 2020-07-17: 3800 [IU]

## 2020-07-17 NOTE — Procedures (Signed)
I was present at this dialysis session. I have reviewed the session itself and made appropriate changes.   Filed Weights   07/15/20 1130 07/17/20 0723  Weight: 66.5 kg 63.4 kg    Recent Labs  Lab 07/17/20 0406  NA 139  K 4.5  CL 102  CO2 27  GLUCOSE 88  BUN 26*  CREATININE 5.75*  CALCIUM 8.2*    Recent Labs  Lab 07/16/20 0444 07/16/20 1639 07/17/20 0406  WBC 8.2 11.1* 9.8  HGB 7.9* 9.2* 8.3*  HCT 25.2* 29.7* 25.7*  MCV 96.9 98.3 95.9  PLT 274 345 318    Scheduled Meds: . sodium chloride   Intravenous Once  . Chlorhexidine Gluconate Cloth  6 each Topical Daily  . Chlorhexidine Gluconate Cloth  6 each Topical Q0600  . cinacalcet  30 mg Oral Q M,W,F  . darbepoetin (ARANESP) injection - DIALYSIS  100 mcg Intravenous Q Wed-HD  . doxercalciferol  1 mcg Intravenous Q M,W,F-HD  . ezetimibe  10 mg Oral Daily  . famotidine  20 mg Oral Daily  . fluticasone furoate-vilanterol  1 puff Inhalation Daily  . levothyroxine  112 mcg Oral Daily  . multivitamin  1 tablet Oral Daily  . mupirocin ointment  1 application Nasal BID  . pramipexole  0.125 mg Oral QHS   Continuous Infusions: PRN Meds:.acetaminophen **OR** acetaminophen, albuterol, HYDROcodone-acetaminophen, ondansetron **OR** ondansetron (ZOFRAN) IV   Gean Quint, MD [facility] 07/17/2020, 8:17 AM

## 2020-07-17 NOTE — Progress Notes (Signed)
  [facility] Progress Note    Assessment/ Plan:   ESRD:  Outpatient orders: MWF/[facility]. 3.5 hours, F160, 400/500, 2K, 2.5ca, uf profile 2. 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 Supportive care and observation as per GI  Volume/ hypertension: EDW 64.5kg. Attempt to achieve EDW as tolerated  Anemia of Chronic Kidney Disease: due for ESA, receives mircera q2weeks, last dose 3/30 (58mcg), started aranesp with an increase in equivalent dose: 162mcg qwed, started 4/13  Secondary Hyperparathyroidism/Hyperphosphatemia: on auryxia but not on formulary here, monitor phos and restrict phos in diet  Afib -asa on hold, rate controlled currently, per primary   COPD -on supplemental o2, per primary  Vascular access: [facility] TDC. Catheter dependent  # Additional recommendations: - Dose all meds for creatinine clearance <10 ml/min  - Unless absolutely necessary, no MRIs with gadolinium.  - Implement save arm precautions. Prefer needle sticks in the dorsum of the hands or wrists. No blood pressure measurements in arm. - If blood transfusion is requested during hemodialysis sessions, please alert [place] prior to the session.  - If a hemodialysis catheter line culture is requested, please alert [place] as only hemodialysis nurses are able to collect those specimens.   Subjective:   Seen on hd. Tolerating treatment, no complaints. She reports that she has not noticed any bright red blood in her stools. ufg 1500cc.   Objective:   BP (!) 161/84 (BP Location: Right Arm)   Pulse 92   Temp 97.8 F (36.6 C) (Oral)   Resp 13   Wt 63.4 kg   SpO2 100%   BMI 25.56 kg/m  No intake or output data in the 24 hours ending 07/17/20 0818 Weight change:   Physical Exam: Gen:nad CVS:s1s2 Resp:cta bl WFU:XNAT, nt/nd Ext:no edema Neuro: alert, awake, speech clear and coherent Dialysis access: [facility]  Imaging: No results found.  Labs: BMET Recent Labs  Lab 07/15/20 0504 07/16/20 0444 07/17/20 0406  NA 141 140 139  K 4.1 3.5 4.5  CL 104 102 102  CO2 26 30 27   GLUCOSE 71 84 88  BUN 29* 13 26*  CREATININE 4.93* 3.99* 5.75*  CALCIUM 7.8* 8.1* 8.2*   CBC Recent Labs  Lab 07/15/20 1615 07/16/20 0444 07/16/20 1639 07/17/20 0406  WBC 10.9* 8.2 11.1* 9.8  HGB 8.5* 7.9* 9.2* 8.3*  HCT 27.0* 25.2* 29.7* 25.7*  MCV 95.1 96.9 98.3 95.9  PLT 278 274 345 318    Medications:    . sodium chloride   Intravenous Once  . Chlorhexidine Gluconate Cloth  6 each Topical Daily  . Chlorhexidine Gluconate Cloth  6 each Topical Q0600  . cinacalcet  30 mg Oral Q M,W,F  . darbepoetin (ARANESP) injection - DIALYSIS  100 mcg Intravenous Q Wed-HD  . doxercalciferol  1 mcg Intravenous Q M,W,F-HD  . ezetimibe  10 mg Oral Daily  . famotidine  20 mg Oral Daily  . fluticasone furoate-vilanterol  1 puff Inhalation Daily  . levothyroxine  112 mcg Oral Daily  . multivitamin  1 tablet Oral Daily  . mupirocin ointment  1 application Nasal BID  . pramipexole  0.125 mg Oral QHS      Gean Quint, MD [facility] 07/17/2020, 8:18 AM

## 2020-07-17 NOTE — Discharge Summary (Signed)
Physician Discharge Summary  [name]:[id number] DOB: 1940-12-18 DOA: 07/14/2020  PCP: [name], FNP  Admit date: 07/14/2020 Discharge date: 07/17/2020 30 Day Unplanned Readmission Risk Score   Flowsheet Row Admission (Current) from 07/14/2020 in [facility]  30 Day Unplanned Readmission Risk Score (%) 16.46 Filed at 07/17/2020 0801     This score is the patient's risk of an unplanned readmission within 30 days of being discharged (0 -100%). The score is based on dignosis, age, lab data, medications, orders, and past utilization.   Low:  0-14.9   Medium: 15-21.9   High: 22-29.9   Extreme: 30 and above         Admitted From: Home Disposition: Home  Recommendations for Outpatient Follow-up:  1. Follow up with PCP in 1-2 weeks 2. Follow-up with routine hemodialysis 3. Please obtain BMP/CBC in one week 4. Please follow up with your PCP on the following pending results: Unresulted Labs (From admission, onward)          Start     Ordered   Signed and Held  Renal function panel  Once,   R        Signed and Held   Signed and Held  CBC  Once,   R        Signed and [place]: Yes Equipment/Devices: None  Discharge Condition: Stable CODE STATUS: DNR Diet recommendation: Low-sodium  Subjective: Seen and examined this morning in dialysis unit.  No complaints.  She has not noticed any rectal bleeding in about 48 hours.  She is happy to hear that she can be discharged today.  Brief/Interim Summary: [name] a 80 y.o.femalewith medical history significant ofCOPD, on oxygen at night, ESRD,MW F,A. Fib-on aspirin for that,iron deficiency anemia was sent to hospital as a direct admit from her gastroenterologist office where she was presented with bright red blood per rectum going on for about 2 weeks. Patient had similar symptoms intermittently for the past 2 years and had EGDs and colonoscopies.  She had no other  complaint.  She was seen by her PCP recently and her hemoglobin was 8, Hemoccult test was positive and they advised transfusion but her dialysis center refused transfusion unless hemoglobin is below 7.   On 07/14/2020, at GI office her hemoglobin was 7. Baseline around 10.  She was sent for direct admission.  She was hemodynamically stable upon presentation.  After admission with hospitalist, she received 1 unit of PRBC transfusion.  Her hemoglobin improved to 7.6.  She did not have any further episodes of rectal bleeding during this hospitalization.  She was seen by GI however they did not recommend any further colonoscopy but recommended to monitor her.  Up until yesterday, her hemoglobin remained stable and she did not require further transfusion and no further episodes of rectal bleeding but GI advised to keep her in the hospital for another 24 hours monitoring due to recurrent history of rectal bleeding.  In last 24 hours, no further episodes of rectal bleeding and hemoglobin remained stable.  She is now being discharged in stable condition.  Nephrology was also consulted and she received her routine hemodialysis.  Discharge Diagnoses:  Active Problems:   ESRD (end stage renal disease) on dialysis ([place])   Gastroesophageal reflux disease without esophagitis   Acute blood loss anemia   COPD (chronic obstructive pulmonary disease) (HCC)   Lower GI bleed  Discharge Instructions   Allergies as of 07/17/2020      Reactions   Amoxicillin Rash   Has patient had a PCN reaction causing immediate rash, facial/tongue/throat swelling, SOB or lightheadedness with hypotension:YES Has patient had a PCN reaction causing severe rash involving mucus membranes or skin necrosis: Yes Has patient had a PCN reaction that required hospitalization No Has patient had a PCN reaction occurring within the last 10 years: Yes If all of the above answers are "NO", then may proceed with Cephalosporin use.   Elemental  Sulfur Hives   Sulfur Hives   Peg 3350-electrolytes Other (See Comments)   Sulfa Drugs Cross Reactors Hives, Itching   Miralax [polyethylene Glycol] Itching   Percocet [oxycodone-acetaminophen] Itching, Rash   Did not happen last time she took it   Sulfa Antibiotics Hives      Medication List    TAKE these medications   acetaminophen 500 MG tablet Commonly known as: TYLENOL Take 2 tablets (1,000 mg total) by mouth every 8 (eight) hours as needed for mild pain or headache.   albuterol 108 (90 Base) MCG/ACT inhaler Commonly known as: VENTOLIN HFA Inhale 2 puffs into the lungs every 6 (six) hours as needed for wheezing or shortness of breath.   alendronate 70 MG tablet Commonly known as: FOSAMAX Take 1 tablet (70 mg total) by mouth every Thursday.   Auryxia 1 GM 210 MG(Fe) tablet Generic drug: ferric citrate Take 210 mg by mouth 3 (three) times daily with meals.   budesonide-formoterol 80-4.5 MCG/ACT inhaler Commonly known as: Symbicort Inhale 2 puffs into the lungs 2 (two) times daily.   cinacalcet 30 MG tablet Commonly known as: SENSIPAR Take 1 tablet (30 mg total) by mouth every Monday, Wednesday, and Friday.   docusate calcium 240 MG capsule Commonly known as: SURFAK Take 1 capsule (240 mg total) by mouth daily.   doxercalciferol 4 MCG/2ML injection Commonly known as: HECTOROL Inject 0.5 mLs (1 mcg total) into the vein every Monday, Wednesday, and Friday with hemodialysis.   ezetimibe 10 MG tablet Commonly known as: ZETIA Take 1 tablet (10 mg total) by mouth daily.   famotidine 20 MG tablet Commonly known as: PEPCID Take 1 tablet (20 mg total) by mouth 2 (two) times daily.   HYDROcodone-acetaminophen 5-325 MG tablet Commonly known as: NORCO/VICODIN Take 1 tablet by mouth every 6 (six) hours as needed for moderate pain.   levothyroxine 112 MCG tablet Commonly known as: SYNTHROID TAKE 1 TABLET BY MOUTH EVERY DAY What changed: when to take this   MIRCERA  IJ Mircera   multivitamin Tabs tablet Take 1 tablet by mouth daily.   nystatin cream Commonly known as: MYCOSTATIN Apply 1 application topically 2 (two) times daily as needed for dry skin.   pantoprazole 20 MG tablet Commonly known as: PROTONIX Take 1 tablet (20 mg total) by mouth daily.   pramipexole 0.125 MG tablet Commonly known as: MIRAPEX Take 1 tablet (0.125 mg total) by mouth at bedtime.   Vitamin D (Ergocalciferol) 1.25 MG (50000 UNIT) Caps capsule Commonly known as: DRISDOL Take 1 capsule (50,000 Units total) by mouth every Monday.       Follow-up Information    [name], FNP Follow up in 1 week(s).   Specialty: Family Medicine Contact information: [place] [address] [phone]              Allergies  Allergen Reactions  . Amoxicillin Rash    Has patient had a PCN  reaction causing immediate rash, facial/tongue/throat swelling, SOB or lightheadedness with hypotension:YES Has patient had a PCN reaction causing severe rash involving mucus membranes or skin necrosis: Yes Has patient had a PCN reaction that required hospitalization No Has patient had a PCN reaction occurring within the last 10 years: Yes If all of the above answers are "NO", then may proceed with Cephalosporin use.   . Elemental Sulfur Hives  . Sulfur Hives  . Peg 3350-Electrolytes Other (See Comments)  . Sulfa Drugs Cross Reactors Hives and Itching  . Miralax [Polyethylene Glycol] Itching  . Percocet [Oxycodone-Acetaminophen] Itching and Rash    Did not happen last time she took it  . Sulfa Antibiotics Hives    Consultations: GI and nephrology   Procedures/Studies: NM GI Blood Loss  Result Date: 07/14/2020 CLINICAL DATA:  Gastrointestinal hemorrhage, bright red blood per rectum EXAM: NUCLEAR MEDICINE GASTROINTESTINAL BLEEDING SCAN TECHNIQUE: Sequential abdominal images were obtained following intravenous administration of Tc-82m labeled red blood cells.  RADIOPHARMACEUTICALS:  26.2 mCi Tc-110m pertechnetate in-vitro labeled red cells. COMPARISON:  None. FINDINGS: Normal distribution of radiotracer within the vascular space, spleen, and liver. No evidence of active gastrointestinal hemorrhage identified. IMPRESSION: No active gastrointestinal hemorrhage. Electronically Signed   By: Fidela Salisbury MD   On: 07/14/2020 22:55      Discharge Exam: Vitals:   07/17/20 0930 07/17/20 1000  BP: 120/72 134/82  Pulse: 98 95  Resp: 15 17  Temp:    SpO2:     Vitals:   07/17/20 0830 07/17/20 0900 07/17/20 0930 07/17/20 1000  BP: 122/72 117/84 120/72 134/82  Pulse:  73 98 95  Resp: 15 15 15 17   Temp:      TempSrc:      SpO2:      Weight:        General: Pt is alert, awake, not in acute distress Cardiovascular: RRR, S1/S2 +, no rubs, no gallops Respiratory: CTA bilaterally, no wheezing, no rhonchi Abdominal: Soft, NT, ND, bowel sounds + Extremities: no edema, no cyanosis    The results of significant diagnostics from this hospitalization (including imaging, microbiology, ancillary and laboratory) are listed below for reference.     Microbiology: Recent Results (from the past 240 hour(s))  Fecal occult blood, imunochemical     Status: Abnormal   Collection Time: 07/08/20 11:03 AM   Specimen: Stool   ST  Result Value Ref Range Status   Fecal Occult Bld Positive (A) Negative Final  SARS CORONAVIRUS 2 (TAT 6-24 HRS) Nasopharyngeal Nasopharyngeal Swab     Status: None   Collection Time: 07/14/20  5:31 PM   Specimen: Nasopharyngeal Swab  Result Value Ref Range Status   SARS Coronavirus 2 NEGATIVE NEGATIVE Final    Comment: (NOTE) SARS-CoV-2 target nucleic acids are NOT DETECTED.  The SARS-CoV-2 RNA is generally detectable in upper and lower respiratory specimens during the acute phase of infection. Negative results do not preclude SARS-CoV-2 infection, do not rule out co-infections with other pathogens, and should not be used as  the sole basis for treatment or other patient management decisions. Negative results must be combined with clinical observations, patient history, and epidemiological information. The expected result is Negative.  Fact Sheet for Patients: SugarRoll.be  Fact Sheet for Healthcare Providers: https://www.woods-mathews.com/  This test is not yet approved or cleared by the [place] FDA and  has been authorized for detection and/or diagnosis of SARS-CoV-2 by FDA under an Emergency Use Authorization (EUA). This EUA will remain  in effect (meaning  this test can be used) for the duration of the COVID-19 declaration under Se ction 564(b)(1) of the Act, 21 U.S.C. section 360bbb-3(b)(1), unless the authorization is terminated or revoked sooner.  Performed at [facility], [place] [address] [place], [place] [address]   MRSA PCR Screening     Status: Abnormal   Collection Time: 07/15/20 10:28 AM   Specimen: Nasopharyngeal  Result Value Ref Range Status   MRSA by PCR POSITIVE (A) NEGATIVE Final    Comment:        The GeneXpert MRSA Assay (FDA approved for NASAL specimens only), is one component of a comprehensive MRSA colonization surveillance program. It is not intended to diagnose MRSA infection nor to guide or monitor treatment for MRSA infections. CRITICAL RESULT CALLED TO, READ BACK BY AND VERIFIED WITH: RN Alphia Moh AT [id number] ON 07/15/20 BY KJ Performed at [facility], [place] [address], [place], [place] [address]      Labs: BNP (last 3 results) No results for input(s): BNP in the last 8760 hours. Basic Metabolic Panel: Recent Labs  Lab 07/15/20 0504 07/16/20 0444 07/17/20 0406  NA 141 140 139  K 4.1 3.5 4.5  CL 104 102 102  CO2 26 30 27   GLUCOSE 71 84 88  BUN 29* 13 26*  CREATININE 4.93* 3.99* 5.75*  CALCIUM 7.8* 8.1* 8.2*   Liver Function Tests: Recent Labs  Lab 07/15/20 0504  AST 23  ALT 15  ALKPHOS 75   BILITOT 0.7  PROT 5.1*  ALBUMIN 2.5*   No results for input(s): LIPASE, AMYLASE in the last 168 hours. No results for input(s): AMMONIA in the last 168 hours. CBC: Recent Labs  Lab 07/15/20 0504 07/15/20 1615 07/16/20 0444 07/16/20 1639 07/17/20 0406  WBC 9.2 10.9* 8.2 11.1* 9.8  HGB 7.7* 8.5* 7.9* 9.2* 8.3*  HCT 24.3* 27.0* 25.2* 29.7* 25.7*  MCV 96.4 95.1 96.9 98.3 95.9  PLT 283 278 274 345 318   Cardiac Enzymes: No results for input(s): CKTOTAL, CKMB, CKMBINDEX, TROPONINI in the last 168 hours. BNP: Invalid input(s): POCBNP CBG: No results for input(s): GLUCAP in the last 168 hours. D-Dimer No results for input(s): DDIMER in the last 72 hours. Hgb A1c No results for input(s): HGBA1C in the last 72 hours. Lipid Profile No results for input(s): CHOL, HDL, LDLCALC, TRIG, CHOLHDL, LDLDIRECT in the last 72 hours. Thyroid function studies No results for input(s): TSH, T4TOTAL, T3FREE, THYROIDAB in the last 72 hours.  Invalid input(s): FREET3 Anemia work up No results for input(s): VITAMINB12, FOLATE, FERRITIN, TIBC, IRON, RETICCTPCT in the last 72 hours. Urinalysis    Component Value Date/Time   COLORURINE YELLOW 07/26/2013 1202   APPEARANCEUR Cloudy (A) 05/20/2020 1128   LABSPEC 1.016 07/26/2013 1202   PHURINE 7.0 07/26/2013 1202   GLUCOSEU Negative 05/20/2020 1128   HGBUR SMALL (A) 07/26/2013 1202   BILIRUBINUR Negative 05/20/2020 1128   KETONESUR NEGATIVE 07/26/2013 1202   PROTEINUR 3+ (A) 05/20/2020 1128   PROTEINUR 100 (A) 07/26/2013 1202   UROBILINOGEN 1.0 07/26/2013 1202   NITRITE Negative 05/20/2020 1128   NITRITE NEGATIVE 07/26/2013 1202   LEUKOCYTESUR 3+ (A) 05/20/2020 1128   Sepsis Labs Invalid input(s): PROCALCITONIN,  WBC,  LACTICIDVEN Microbiology Recent Results (from the past 240 hour(s))  Fecal occult blood, imunochemical     Status: Abnormal   Collection Time: 07/08/20 11:03 AM   Specimen: Stool   ST  Result Value Ref Range Status    Fecal Occult Bld  Positive (A) Negative Final  SARS CORONAVIRUS 2 (TAT 6-24 HRS) Nasopharyngeal Nasopharyngeal Swab     Status: None   Collection Time: 07/14/20  5:31 PM   Specimen: Nasopharyngeal Swab  Result Value Ref Range Status   SARS Coronavirus 2 NEGATIVE NEGATIVE Final    Comment: (NOTE) SARS-CoV-2 target nucleic acids are NOT DETECTED.  The SARS-CoV-2 RNA is generally detectable in upper and lower respiratory specimens during the acute phase of infection. Negative results do not preclude SARS-CoV-2 infection, do not rule out co-infections with other pathogens, and should not be used as the sole basis for treatment or other patient management decisions. Negative results must be combined with clinical observations, patient history, and epidemiological information. The expected result is Negative.  Fact Sheet for Patients: SugarRoll.be  Fact Sheet for Healthcare Providers: https://www.woods-mathews.com/  This test is not yet approved or cleared by the [place] FDA and  has been authorized for detection and/or diagnosis of SARS-CoV-2 by FDA under an Emergency Use Authorization (EUA). This EUA will remain  in effect (meaning this test can be used) for the duration of the COVID-19 declaration under Se ction 564(b)(1) of the Act, 21 U.S.C. section 360bbb-3(b)(1), unless the authorization is terminated or revoked sooner.  Performed at [facility], [place] [address], [place], [place] [address]   MRSA PCR Screening     Status: Abnormal   Collection Time: 07/15/20 10:28 AM   Specimen: Nasopharyngeal  Result Value Ref Range Status   MRSA by PCR POSITIVE (A) NEGATIVE Final    Comment:        The GeneXpert MRSA Assay (FDA approved for NASAL specimens only), is one component of a comprehensive MRSA colonization surveillance program. It is not intended to diagnose MRSA infection nor to guide or monitor treatment for MRSA  infections. CRITICAL RESULT CALLED TO, READ BACK BY AND VERIFIED WITH: RN Alphia Moh AT [phone] ON 07/15/20 BY KJ Performed at [facility], [place] [address] [place], [place] [address]      Time coordinating discharge: Over 30 minutes  SIGNED:   Darliss Cheney, MD  [facility] 07/17/2020, 10:20 AM  If 7PM-7AM, please contact night-coverage [email or web address]

## 2020-07-17 NOTE — TOC Transition Note (Signed)
Transition of Care [facility]) - CM/SW Discharge Note   Patient Details  Name: [name] MRN: [id number] Date of Birth: 02-05-1941  Transition of Care [facility]) CM/SW Contact:  Joanne Chars, LCSW Phone Number: 07/17/2020, 12:11 PM   Clinical Narrative:  CSW attempted to contact [facility] regarding referral but was unable to reach them.  CSW spoke with pt daughter in law Baldo Ash who reports that [facility] contacted her yesterday and they have accepted the referral and will start services shortly.  No DME needed.  No other needs identified.       Final next level of care: [facility] Barriers to Discharge: Barriers Resolved   Patient Goals and CMS Choice Patient states their goals for this hospitalization and ongoing recovery are:: Home with Home health   Choice offered to / list presented to : Patient  Discharge Placement                       Discharge Plan and Services     Post Acute Care Choice: Home Health                               Social Determinants of Health (SDOH) Interventions     Readmission Risk Interventions No flowsheet data found.

## 2020-07-17 NOTE — Discharge Instructions (Signed)

## 2020-07-20 DIAGNOSIS — Z4901 Encounter for fitting and adjustment of extracorporeal dialysis catheter: Secondary | ICD-10-CM | POA: Diagnosis not present

## 2020-07-20 DIAGNOSIS — N186 End stage renal disease: Secondary | ICD-10-CM | POA: Diagnosis not present

## 2020-07-20 DIAGNOSIS — D509 Iron deficiency anemia, unspecified: Secondary | ICD-10-CM | POA: Diagnosis not present

## 2020-07-20 DIAGNOSIS — D689 Coagulation defect, unspecified: Secondary | ICD-10-CM | POA: Diagnosis not present

## 2020-07-20 DIAGNOSIS — J449 Chronic obstructive pulmonary disease, unspecified: Secondary | ICD-10-CM | POA: Diagnosis not present

## 2020-07-20 DIAGNOSIS — Z992 Dependence on renal dialysis: Secondary | ICD-10-CM | POA: Diagnosis not present

## 2020-07-20 DIAGNOSIS — N185 Chronic kidney disease, stage 5: Secondary | ICD-10-CM | POA: Diagnosis not present

## 2020-07-20 DIAGNOSIS — D62 Acute posthemorrhagic anemia: Secondary | ICD-10-CM | POA: Diagnosis not present

## 2020-07-20 DIAGNOSIS — N2581 Secondary hyperparathyroidism of renal origin: Secondary | ICD-10-CM | POA: Diagnosis not present

## 2020-07-20 DIAGNOSIS — D631 Anemia in chronic kidney disease: Secondary | ICD-10-CM | POA: Diagnosis not present

## 2020-07-20 DIAGNOSIS — E1121 Type 2 diabetes mellitus with diabetic nephropathy: Secondary | ICD-10-CM | POA: Diagnosis not present

## 2020-07-20 DIAGNOSIS — I120 Hypertensive chronic kidney disease with stage 5 chronic kidney disease or end stage renal disease: Secondary | ICD-10-CM | POA: Diagnosis not present

## 2020-07-20 DIAGNOSIS — K922 Gastrointestinal hemorrhage, unspecified: Secondary | ICD-10-CM | POA: Diagnosis not present

## 2020-07-20 NOTE — TOC Transition Note (Signed)
Transition of Care Contact from [place]  Date of Discharge: 07/17/20 Date of Contact: 07/19/20 Method: Phone Spoke to: Daughter-in-Law  Patient contacted to discuss transition of care from recent hospitalization. I spoke to patient's daughter-in-law. Patient admitted to [facility] from 4/12-4/15/22 for acute blood loss anemia, ESRD, GERD, and COPD.   Patient's daughter reports that patient is doing well. She has all her medications and will make follow-up appointments on Monday 07/20/20. Patient's daughter-in-law reports patient will be at scheduled HD treatment on Monday 07/20/20.  Tobie Poet, NP

## 2020-07-21 DIAGNOSIS — I120 Hypertensive chronic kidney disease with stage 5 chronic kidney disease or end stage renal disease: Secondary | ICD-10-CM | POA: Diagnosis not present

## 2020-07-21 DIAGNOSIS — D62 Acute posthemorrhagic anemia: Secondary | ICD-10-CM | POA: Diagnosis not present

## 2020-07-21 DIAGNOSIS — J449 Chronic obstructive pulmonary disease, unspecified: Secondary | ICD-10-CM | POA: Diagnosis not present

## 2020-07-21 DIAGNOSIS — K922 Gastrointestinal hemorrhage, unspecified: Secondary | ICD-10-CM | POA: Diagnosis not present

## 2020-07-21 DIAGNOSIS — N186 End stage renal disease: Secondary | ICD-10-CM | POA: Diagnosis not present

## 2020-07-22 DIAGNOSIS — N2581 Secondary hyperparathyroidism of renal origin: Secondary | ICD-10-CM | POA: Diagnosis not present

## 2020-07-22 DIAGNOSIS — N185 Chronic kidney disease, stage 5: Secondary | ICD-10-CM | POA: Diagnosis not present

## 2020-07-22 DIAGNOSIS — E1121 Type 2 diabetes mellitus with diabetic nephropathy: Secondary | ICD-10-CM | POA: Diagnosis not present

## 2020-07-22 DIAGNOSIS — D689 Coagulation defect, unspecified: Secondary | ICD-10-CM | POA: Diagnosis not present

## 2020-07-22 DIAGNOSIS — Z4901 Encounter for fitting and adjustment of extracorporeal dialysis catheter: Secondary | ICD-10-CM | POA: Diagnosis not present

## 2020-07-22 DIAGNOSIS — N186 End stage renal disease: Secondary | ICD-10-CM | POA: Diagnosis not present

## 2020-07-22 DIAGNOSIS — D509 Iron deficiency anemia, unspecified: Secondary | ICD-10-CM | POA: Diagnosis not present

## 2020-07-22 DIAGNOSIS — Z992 Dependence on renal dialysis: Secondary | ICD-10-CM | POA: Diagnosis not present

## 2020-07-22 DIAGNOSIS — D631 Anemia in chronic kidney disease: Secondary | ICD-10-CM | POA: Diagnosis not present

## 2020-07-23 DIAGNOSIS — I120 Hypertensive chronic kidney disease with stage 5 chronic kidney disease or end stage renal disease: Secondary | ICD-10-CM | POA: Diagnosis not present

## 2020-07-23 DIAGNOSIS — J449 Chronic obstructive pulmonary disease, unspecified: Secondary | ICD-10-CM | POA: Diagnosis not present

## 2020-07-23 DIAGNOSIS — K922 Gastrointestinal hemorrhage, unspecified: Secondary | ICD-10-CM | POA: Diagnosis not present

## 2020-07-23 DIAGNOSIS — N186 End stage renal disease: Secondary | ICD-10-CM | POA: Diagnosis not present

## 2020-07-23 DIAGNOSIS — D62 Acute posthemorrhagic anemia: Secondary | ICD-10-CM | POA: Diagnosis not present

## 2020-07-24 ENCOUNTER — Telehealth: Payer: Self-pay

## 2020-07-24 DIAGNOSIS — Z992 Dependence on renal dialysis: Secondary | ICD-10-CM | POA: Diagnosis not present

## 2020-07-24 DIAGNOSIS — Z4901 Encounter for fitting and adjustment of extracorporeal dialysis catheter: Secondary | ICD-10-CM | POA: Diagnosis not present

## 2020-07-24 DIAGNOSIS — N185 Chronic kidney disease, stage 5: Secondary | ICD-10-CM | POA: Diagnosis not present

## 2020-07-24 DIAGNOSIS — D689 Coagulation defect, unspecified: Secondary | ICD-10-CM | POA: Diagnosis not present

## 2020-07-24 DIAGNOSIS — D509 Iron deficiency anemia, unspecified: Secondary | ICD-10-CM | POA: Diagnosis not present

## 2020-07-24 DIAGNOSIS — N2581 Secondary hyperparathyroidism of renal origin: Secondary | ICD-10-CM | POA: Diagnosis not present

## 2020-07-24 DIAGNOSIS — E1121 Type 2 diabetes mellitus with diabetic nephropathy: Secondary | ICD-10-CM | POA: Diagnosis not present

## 2020-07-24 DIAGNOSIS — N186 End stage renal disease: Secondary | ICD-10-CM | POA: Diagnosis not present

## 2020-07-24 DIAGNOSIS — D631 Anemia in chronic kidney disease: Secondary | ICD-10-CM | POA: Diagnosis not present

## 2020-07-25 DIAGNOSIS — M1990 Unspecified osteoarthritis, unspecified site: Secondary | ICD-10-CM | POA: Diagnosis not present

## 2020-07-25 DIAGNOSIS — I480 Paroxysmal atrial fibrillation: Secondary | ICD-10-CM | POA: Diagnosis not present

## 2020-07-25 DIAGNOSIS — N185 Chronic kidney disease, stage 5: Secondary | ICD-10-CM | POA: Diagnosis not present

## 2020-07-25 DIAGNOSIS — J9621 Acute and chronic respiratory failure with hypoxia: Secondary | ICD-10-CM | POA: Diagnosis not present

## 2020-07-27 DIAGNOSIS — D62 Acute posthemorrhagic anemia: Secondary | ICD-10-CM | POA: Diagnosis not present

## 2020-07-27 DIAGNOSIS — I120 Hypertensive chronic kidney disease with stage 5 chronic kidney disease or end stage renal disease: Secondary | ICD-10-CM | POA: Diagnosis not present

## 2020-07-27 DIAGNOSIS — J449 Chronic obstructive pulmonary disease, unspecified: Secondary | ICD-10-CM | POA: Diagnosis not present

## 2020-07-27 DIAGNOSIS — K922 Gastrointestinal hemorrhage, unspecified: Secondary | ICD-10-CM | POA: Diagnosis not present

## 2020-07-27 DIAGNOSIS — Z4901 Encounter for fitting and adjustment of extracorporeal dialysis catheter: Secondary | ICD-10-CM | POA: Diagnosis not present

## 2020-07-27 DIAGNOSIS — D689 Coagulation defect, unspecified: Secondary | ICD-10-CM | POA: Diagnosis not present

## 2020-07-27 DIAGNOSIS — N2581 Secondary hyperparathyroidism of renal origin: Secondary | ICD-10-CM | POA: Diagnosis not present

## 2020-07-27 DIAGNOSIS — N186 End stage renal disease: Secondary | ICD-10-CM | POA: Diagnosis not present

## 2020-07-27 DIAGNOSIS — R519 Headache, unspecified: Secondary | ICD-10-CM | POA: Diagnosis not present

## 2020-07-27 DIAGNOSIS — D509 Iron deficiency anemia, unspecified: Secondary | ICD-10-CM | POA: Diagnosis not present

## 2020-07-27 DIAGNOSIS — Z992 Dependence on renal dialysis: Secondary | ICD-10-CM | POA: Diagnosis not present

## 2020-07-27 NOTE — Telephone Encounter (Signed)
Appointment scheduled 08/04/20 with [name].  Offered a sooner appointment but patient unable to come then because of dialysis schedule.

## 2020-07-28 DIAGNOSIS — I480 Paroxysmal atrial fibrillation: Secondary | ICD-10-CM | POA: Diagnosis not present

## 2020-07-29 DIAGNOSIS — K922 Gastrointestinal hemorrhage, unspecified: Secondary | ICD-10-CM | POA: Diagnosis not present

## 2020-07-29 DIAGNOSIS — R519 Headache, unspecified: Secondary | ICD-10-CM | POA: Diagnosis not present

## 2020-07-29 DIAGNOSIS — D62 Acute posthemorrhagic anemia: Secondary | ICD-10-CM | POA: Diagnosis not present

## 2020-07-29 DIAGNOSIS — N2581 Secondary hyperparathyroidism of renal origin: Secondary | ICD-10-CM | POA: Diagnosis not present

## 2020-07-29 DIAGNOSIS — J449 Chronic obstructive pulmonary disease, unspecified: Secondary | ICD-10-CM | POA: Diagnosis not present

## 2020-07-29 DIAGNOSIS — Z4901 Encounter for fitting and adjustment of extracorporeal dialysis catheter: Secondary | ICD-10-CM | POA: Diagnosis not present

## 2020-07-29 DIAGNOSIS — I120 Hypertensive chronic kidney disease with stage 5 chronic kidney disease or end stage renal disease: Secondary | ICD-10-CM | POA: Diagnosis not present

## 2020-07-29 DIAGNOSIS — N186 End stage renal disease: Secondary | ICD-10-CM | POA: Diagnosis not present

## 2020-07-29 DIAGNOSIS — D509 Iron deficiency anemia, unspecified: Secondary | ICD-10-CM | POA: Diagnosis not present

## 2020-07-29 DIAGNOSIS — D689 Coagulation defect, unspecified: Secondary | ICD-10-CM | POA: Diagnosis not present

## 2020-07-29 DIAGNOSIS — Z992 Dependence on renal dialysis: Secondary | ICD-10-CM | POA: Diagnosis not present

## 2020-07-30 DIAGNOSIS — D62 Acute posthemorrhagic anemia: Secondary | ICD-10-CM | POA: Diagnosis not present

## 2020-07-30 DIAGNOSIS — J449 Chronic obstructive pulmonary disease, unspecified: Secondary | ICD-10-CM | POA: Diagnosis not present

## 2020-07-30 DIAGNOSIS — K922 Gastrointestinal hemorrhage, unspecified: Secondary | ICD-10-CM | POA: Diagnosis not present

## 2020-07-30 DIAGNOSIS — N186 End stage renal disease: Secondary | ICD-10-CM | POA: Diagnosis not present

## 2020-07-30 DIAGNOSIS — I120 Hypertensive chronic kidney disease with stage 5 chronic kidney disease or end stage renal disease: Secondary | ICD-10-CM | POA: Diagnosis not present

## 2020-07-31 DIAGNOSIS — D509 Iron deficiency anemia, unspecified: Secondary | ICD-10-CM | POA: Diagnosis not present

## 2020-07-31 DIAGNOSIS — N186 End stage renal disease: Secondary | ICD-10-CM | POA: Diagnosis not present

## 2020-07-31 DIAGNOSIS — D689 Coagulation defect, unspecified: Secondary | ICD-10-CM | POA: Diagnosis not present

## 2020-07-31 DIAGNOSIS — Z4901 Encounter for fitting and adjustment of extracorporeal dialysis catheter: Secondary | ICD-10-CM | POA: Diagnosis not present

## 2020-07-31 DIAGNOSIS — R519 Headache, unspecified: Secondary | ICD-10-CM | POA: Diagnosis not present

## 2020-07-31 DIAGNOSIS — Z992 Dependence on renal dialysis: Secondary | ICD-10-CM | POA: Diagnosis not present

## 2020-07-31 DIAGNOSIS — N2581 Secondary hyperparathyroidism of renal origin: Secondary | ICD-10-CM | POA: Diagnosis not present

## 2020-08-01 DIAGNOSIS — E1122 Type 2 diabetes mellitus with diabetic chronic kidney disease: Secondary | ICD-10-CM | POA: Diagnosis not present

## 2020-08-01 DIAGNOSIS — N186 End stage renal disease: Secondary | ICD-10-CM | POA: Diagnosis not present

## 2020-08-01 DIAGNOSIS — Z992 Dependence on renal dialysis: Secondary | ICD-10-CM | POA: Diagnosis not present

## 2020-08-03 ENCOUNTER — Other Ambulatory Visit: Payer: Self-pay | Admitting: Family Medicine

## 2020-08-03 DIAGNOSIS — E039 Hypothyroidism, unspecified: Secondary | ICD-10-CM

## 2020-08-03 DIAGNOSIS — R519 Headache, unspecified: Secondary | ICD-10-CM | POA: Diagnosis not present

## 2020-08-03 DIAGNOSIS — N2581 Secondary hyperparathyroidism of renal origin: Secondary | ICD-10-CM | POA: Diagnosis not present

## 2020-08-03 DIAGNOSIS — D689 Coagulation defect, unspecified: Secondary | ICD-10-CM | POA: Diagnosis not present

## 2020-08-03 DIAGNOSIS — D509 Iron deficiency anemia, unspecified: Secondary | ICD-10-CM | POA: Diagnosis not present

## 2020-08-03 DIAGNOSIS — D631 Anemia in chronic kidney disease: Secondary | ICD-10-CM | POA: Diagnosis not present

## 2020-08-03 DIAGNOSIS — Z4901 Encounter for fitting and adjustment of extracorporeal dialysis catheter: Secondary | ICD-10-CM | POA: Diagnosis not present

## 2020-08-03 DIAGNOSIS — N186 End stage renal disease: Secondary | ICD-10-CM | POA: Diagnosis not present

## 2020-08-03 DIAGNOSIS — Z992 Dependence on renal dialysis: Secondary | ICD-10-CM | POA: Diagnosis not present

## 2020-08-04 ENCOUNTER — Ambulatory Visit (INDEPENDENT_AMBULATORY_CARE_PROVIDER_SITE_OTHER): Payer: Medicare Other | Admitting: Family Medicine

## 2020-08-04 ENCOUNTER — Encounter: Payer: Self-pay | Admitting: Family Medicine

## 2020-08-04 ENCOUNTER — Other Ambulatory Visit: Payer: Self-pay

## 2020-08-04 VITALS — BP 130/80 | HR 87 | Temp 98.1°F | Ht 62.0 in | Wt 136.0 lb

## 2020-08-04 DIAGNOSIS — Z8719 Personal history of other diseases of the digestive system: Secondary | ICD-10-CM | POA: Diagnosis not present

## 2020-08-04 DIAGNOSIS — E440 Moderate protein-calorie malnutrition: Secondary | ICD-10-CM | POA: Diagnosis not present

## 2020-08-04 NOTE — Patient Instructions (Signed)
Protein Content in Foods Protein is a necessary nutrient in any diet. It helps build and repair muscles, bones, and skin. Depending on your overall health, you may need more or less protein in your diet. You are encouraged to eat a variety of protein foods to ensure that you get all the essential nutrients that are found in different protein foods. Talk to your health care provider or dietitian about how much protein you need each day and which sources of protein are best for you. Protein is especially important for:  Repairing and making cells and tissues.  Fighting infection.  Energy.  Growth and development. See the following list for the protein content of some common foods. What are tips for getting more protein in your diet?  Try to replace processed carbohydrates with high-quality protein.  Snack on nuts and seeds instead of chips.  Replace baked desserts with Greek yogurt.  Eat protein foods from both plant and animal sources.  Replace red meat with seafood choices. What foods are high in proteins? High-protein foods contain 4 grams (g) or more of protein per serving. They include: Meat protein  Beef, ground sirloin (cooked) -- 3 oz (85 g) have 24 g of protein.  Chicken breast, boneless and skinless (cooked) -- 3 oz (85 g) have 25 g of protein.  Egg -- 1 egg has 6 g of protein.  Fish, filet (cooked) -- 1 oz (28 g) has 6-7 g of protein.  Lamb (cooked) -- 3 oz (85 g) has 24 g of protein.  Pork tenderloin (cooked) -- 3 oz (85 g) has 23 g of protein.  Tuna (canned in water) -- 3 oz (85 g) has 20 g of protein. Dairy  Cottage cheese -- 1/2 cup (114 g) has 13.4 g of protein.  Milk -- 1 cup (250 mL) has 8 g of protein.  Cheese (hard) -- 1 oz (28 g) has 7 g of protein.  Yogurt, regular -- 6 oz (170 g) has 8 g of protein.  Greek yogurt -- 6 oz (200 g) has 18 g protein. Plant protein  Garbanzo beans (canned or cooked) -- 1/2 cup (130 g) has 6-7 g of  protein.  Kidney beans (canned or cooked) -- 1/2 cup (130 g) has 6-7 g of protein.  Nuts (peanuts, pistachios, almonds) -- 1 oz (28 g) has 6 g of protein.  Peanut butter -- 1 oz (32 g) has 7-8 g of protein.  Pumpkin seeds -- 1 oz (28 g) has 8.5 g of protein.  Soybeans (roasted) -- 1 oz (28 g) has 8 g of protein.  Soybeans (cooked) -- 1/2 cup (90 g) has 11 g of protein.  Soy milk -- 1 cup (250 mL) has 5-10 g of protein.  Soy or vegetable patty -- 1 patty has 11 g of protein.  Sunflower seeds -- 1 oz (28 g) has 5.5 g of protein.  Tofu (firm) -- 1/2 cup (124 g) has 20 g of protein.  Tempeh -- 1/2 cup (83 g) has 16 g of protein. The items listed above may not be a complete list of foods high in protein. Actual amounts of protein may differ depending on processing. Contact a dietitian for more information.   What foods are low in protein? Low-protein foods contain 3 grams (g) or less of protein per serving. They include: Fruits  Fruit or vegetable juice -- 1/2 cup (125 mL) has 1 g of protein. Vegetables  Beets (raw or cooked) -- 1/2 cup (68   g) has 1.5 g of protein.  Broccoli (raw or cooked) -- 1/2 cup (44 g) has 2 g of protein.  Collard greens (raw or cooked) -- 1/2 cup (42 g) has 2 g of protein.  Green beans (raw or cooked) -- 1/2 cup (83 g) has 1 g of protein.  Green peas (canned) -- 1/2 cup (80 g) has 3.5 g of protein.  Potato (baked with skin) -- 1 medium potato (173 g) has 3 g of protein.  Spinach (cooked) -- 1/2 cup (90 g) has 3 g of protein.  Squash (cooked) -- 1/2 cup (90 g) has 1.5 g of protein.  Avocado -- 1 cup (146 g) has 2.7 g of protein. Grains  Bran cereal -- 1/2 cup (30 g) has 2-3 g of protein.  Bread -- 1 slice has 2.5 g of protein.  Corn (fresh or cooked) -- 1/2 cup (77 g) has 2 g of protein.  Flour tortilla -- One 6-inch (15 cm) tortilla has 2.5 g of protein.  Muffins -- 1 small muffin (2 oz or 57 g) has 3 g of protein.  Oatmeal (cooked) --  1/2 cup (40 g) has 3 g of protein.  Rice (cooked) -- 1/2 cup (79 g) has 2.5-3.5 g of protein. Dairy  Cream cheese -- 1 oz (29 g) has 2 g of protein.  Creamer (half-and-half) -- 1 oz (29 mL) has 1 g of protein.  Frozen yogurt -- 1/2 cup (72 g) has 3 g of protein.  Sour cream -- 1/2 cup (75 g) has 2.5 g of protein. The items listed above may not be a complete list of foods low in protein. Actual amounts of protein may differ depending on processing. Contact a dietitian for more information.   Summary  Protein is a nutrient that your body needs for growth and development, repairing and making cells and tissues, fighting infection, and providing energy.  Protein is in both plant and animal foods. Some of these foods have more protein than others.  Depending on your overall health, you may need more or less protein in your diet. Talk to your health care provider about how much protein you need. This information is not intended to replace advice given to you by your health care provider. Make sure you discuss any questions you have with your health care provider. Document Revised: 03/20/2019 Document Reviewed: 03/20/2019 Elsevier Patient Education  2021 Elsevier Inc.  

## 2020-08-04 NOTE — Progress Notes (Signed)
Assessment & Plan:  1. History of GI bleed Encouraged to schedule a follow-up with gastroenterology.  No further bleeding.  Continue routine monitoring of lab work at dialysis.  2. Moderate protein-calorie malnutrition ([name]) Education provided on high protein foods to try and low protein foods to try to avoid.   Follow up plan: Return as scheduled.  [name], MSN, APRN, FNP-C [facility]  Subjective:   Patient ID: [name], female    DOB: 09/20/1940, 80 y.o.   MRN: [id number]  HPI: [name] is a 80 y.o. female presenting on 08/04/2020 for Hospitalization Follow-up (3/30 [facility]- Diverticulitis.)  Patient's daughter-in-law accompanies her today.  Patient was seen at [facility] on 07/01/2020 due to having blood in her stool.  She was diagnosed with diverticulitis and given prescriptions for Cipro, Flagyl, and Zofran.  She was then seen in our office on 07/06/2020 at which time her point-of-care hemoglobin had dropped from 10.1 down to 8.0.  Her FOBT was positive and she was referred to gastroenterology.  The provider at that time did call her nephrology office to enquire about a blood transfusion, but was told they would not transfuse unless the hemoglobin was <7. When the patient saw GI on 07/14/2020 she was direct admitted to [facility].  Patient was transfused with 1 unit of PRBCs.  She has had no further episodes of rectal bleeding.  Patient's daughter-in-law reports that she has lab work on Wednesdays at dialysis.  She states her hemoglobin was up to a 10 last week and will be repeated again tomorrow.  Patient is concerned that she continues to lose weight.  States she was told to eat more protein, but is tired of eating eggs.  She does drink a protein shake at night.   ROS: Negative unless specifically indicated above in HPI.   Relevant past medical history reviewed and updated as indicated.   Allergies and medications reviewed and updated.   Current  Outpatient Medications:  .  acetaminophen (TYLENOL) 500 MG tablet, Take 2 tablets (1,000 mg total) by mouth every 8 (eight) hours as needed for mild pain or headache., Disp: 30 tablet, Rfl: 0 .  albuterol (VENTOLIN HFA) 108 (90 Base) MCG/ACT inhaler, Inhale 2 puffs into the lungs every 6 (six) hours as needed for wheezing or shortness of breath., Disp: 18 g, Rfl: 2 .  alendronate (FOSAMAX) 70 MG tablet, Take 1 tablet (70 mg total) by mouth every Thursday., Disp: 12 tablet, Rfl: 3 .  AURYXIA 1 GM 210 MG(Fe) tablet, Take 210 mg by mouth 3 (three) times daily with meals., Disp: , Rfl:  .  budesonide-formoterol (SYMBICORT) 80-4.5 MCG/ACT inhaler, Inhale 2 puffs into the lungs 2 (two) times daily., Disp: 3 each, Rfl: 4 .  cinacalcet (SENSIPAR) 30 MG tablet, Take 1 tablet (30 mg total) by mouth every Monday, Wednesday, and Friday., Disp: 60 tablet, Rfl: 0 .  docusate calcium (SURFAK) 240 MG capsule, Take 1 capsule (240 mg total) by mouth daily., Disp: 90 capsule, Rfl: 1 .  doxercalciferol (HECTOROL) 4 MCG/2ML injection, Inject 0.5 mLs (1 mcg total) into the vein every Monday, Wednesday, and Friday with hemodialysis., Disp: 2 mL, Rfl: 0 .  ezetimibe (ZETIA) 10 MG tablet, Take 1 tablet (10 mg total) by mouth daily., Disp: 90 tablet, Rfl: 3 .  famotidine (PEPCID) 20 MG tablet, Take 1 tablet (20 mg total) by mouth 2 (two) times daily., Disp: 180 tablet, Rfl: 1 .  HYDROcodone-acetaminophen (NORCO/VICODIN) 5-325 MG tablet,  Take 1 tablet by mouth every 6 (six) hours as needed for moderate pain., Disp: 30 tablet, Rfl: 0 .  levothyroxine (SYNTHROID) 112 MCG tablet, TAKE 1 TABLET BY MOUTH EVERY DAY (Patient taking differently: Take 112 mcg by mouth daily before breakfast.), Disp: 90 tablet, Rfl: 0 .  multivitamin (RENA-VIT) TABS tablet, Take 1 tablet by mouth daily., Disp: , Rfl:  .  nystatin cream (MYCOSTATIN), Apply 1 application topically 2 (two) times daily as needed for dry skin., Disp: 30 g, Rfl: 0 .   pantoprazole (PROTONIX) 20 MG tablet, Take 1 tablet (20 mg total) by mouth daily., Disp: 90 tablet, Rfl: 1 .  pramipexole (MIRAPEX) 0.125 MG tablet, Take 1 tablet (0.125 mg total) by mouth at bedtime., Disp: 90 tablet, Rfl: 1 .  Vitamin D, Ergocalciferol, (DRISDOL) 1.25 MG (50000 UNIT) CAPS capsule, Take 1 capsule (50,000 Units total) by mouth every Monday., Disp: 12 capsule, Rfl: 3  Allergies  Allergen Reactions  . Amoxicillin Rash    Has patient had a PCN reaction causing immediate rash, facial/tongue/throat swelling, SOB or lightheadedness with hypotension:YES Has patient had a PCN reaction causing severe rash involving mucus membranes or skin necrosis: Yes Has patient had a PCN reaction that required hospitalization No Has patient had a PCN reaction occurring within the last 10 years: Yes If all of the above answers are "NO", then may proceed with Cephalosporin use.   . Elemental Sulfur Hives  . Sulfur Hives  . Peg 3350-Electrolytes Other (See Comments)  . Sulfa Drugs Cross Reactors Hives and Itching  . Miralax [Polyethylene Glycol] Itching  . Mixed Grasses   . Percocet [Oxycodone-Acetaminophen] Itching and Rash    Did not happen last time she took it  . Sulfa Antibiotics Hives    Objective:   BP 130/80   Pulse 87   Temp 98.1 F (36.7 C) (Temporal)   Ht 5\' 2"  (1.575 m)   Wt 136 lb (61.7 kg)   SpO2 98%   BMI 24.87 kg/m    Physical Exam Vitals reviewed.  Constitutional:      General: She is not in acute distress.    Appearance: Normal appearance. She is not ill-appearing, toxic-appearing or diaphoretic.  HENT:     Head: Normocephalic and atraumatic.  Eyes:     General: No scleral icterus.       Right eye: No discharge.        Left eye: No discharge.     Conjunctiva/sclera: Conjunctivae normal.  Cardiovascular:     Rate and Rhythm: Normal rate and regular rhythm.     Heart sounds: Normal heart sounds. No murmur heard. No friction rub. No gallop.   Pulmonary:      Effort: Pulmonary effort is normal. No respiratory distress.     Breath sounds: Normal breath sounds. No stridor. No wheezing, rhonchi or rales.  Musculoskeletal:        General: Normal range of motion.     Cervical back: Normal range of motion.  Skin:    General: Skin is warm and dry.     Capillary Refill: Capillary refill takes less than 2 seconds.  Neurological:     General: No focal deficit present.     Mental Status: She is alert and oriented to person, place, and time. Mental status is at baseline.     Gait: Gait abnormal (riding in [facility]).  Psychiatric:        Mood and Affect: Mood normal.        Behavior:  Behavior normal.        Thought Content: Thought content normal.        Judgment: Judgment normal.

## 2020-08-05 DIAGNOSIS — D689 Coagulation defect, unspecified: Secondary | ICD-10-CM | POA: Diagnosis not present

## 2020-08-05 DIAGNOSIS — J449 Chronic obstructive pulmonary disease, unspecified: Secondary | ICD-10-CM | POA: Diagnosis not present

## 2020-08-05 DIAGNOSIS — N2581 Secondary hyperparathyroidism of renal origin: Secondary | ICD-10-CM | POA: Diagnosis not present

## 2020-08-05 DIAGNOSIS — D509 Iron deficiency anemia, unspecified: Secondary | ICD-10-CM | POA: Diagnosis not present

## 2020-08-05 DIAGNOSIS — N186 End stage renal disease: Secondary | ICD-10-CM | POA: Diagnosis not present

## 2020-08-05 DIAGNOSIS — Z992 Dependence on renal dialysis: Secondary | ICD-10-CM | POA: Diagnosis not present

## 2020-08-05 DIAGNOSIS — Z4901 Encounter for fitting and adjustment of extracorporeal dialysis catheter: Secondary | ICD-10-CM | POA: Diagnosis not present

## 2020-08-05 DIAGNOSIS — I120 Hypertensive chronic kidney disease with stage 5 chronic kidney disease or end stage renal disease: Secondary | ICD-10-CM | POA: Diagnosis not present

## 2020-08-05 DIAGNOSIS — D62 Acute posthemorrhagic anemia: Secondary | ICD-10-CM | POA: Diagnosis not present

## 2020-08-05 DIAGNOSIS — K922 Gastrointestinal hemorrhage, unspecified: Secondary | ICD-10-CM | POA: Diagnosis not present

## 2020-08-05 DIAGNOSIS — R519 Headache, unspecified: Secondary | ICD-10-CM | POA: Diagnosis not present

## 2020-08-05 DIAGNOSIS — D631 Anemia in chronic kidney disease: Secondary | ICD-10-CM | POA: Diagnosis not present

## 2020-08-06 ENCOUNTER — Telehealth: Payer: Self-pay | Admitting: Family Medicine

## 2020-08-06 DIAGNOSIS — D62 Acute posthemorrhagic anemia: Secondary | ICD-10-CM | POA: Diagnosis not present

## 2020-08-06 DIAGNOSIS — N186 End stage renal disease: Secondary | ICD-10-CM | POA: Diagnosis not present

## 2020-08-06 DIAGNOSIS — K922 Gastrointestinal hemorrhage, unspecified: Secondary | ICD-10-CM | POA: Diagnosis not present

## 2020-08-06 DIAGNOSIS — J449 Chronic obstructive pulmonary disease, unspecified: Secondary | ICD-10-CM | POA: Diagnosis not present

## 2020-08-06 DIAGNOSIS — I120 Hypertensive chronic kidney disease with stage 5 chronic kidney disease or end stage renal disease: Secondary | ICD-10-CM | POA: Diagnosis not present

## 2020-08-06 NOTE — Telephone Encounter (Signed)
Please ask patient if she would like to resume outpatient PT. I need to place a new referral if she does.

## 2020-08-06 NOTE — Telephone Encounter (Signed)
Left message to call back  

## 2020-08-07 DIAGNOSIS — Z4901 Encounter for fitting and adjustment of extracorporeal dialysis catheter: Secondary | ICD-10-CM | POA: Diagnosis not present

## 2020-08-07 DIAGNOSIS — D631 Anemia in chronic kidney disease: Secondary | ICD-10-CM | POA: Diagnosis not present

## 2020-08-07 DIAGNOSIS — N186 End stage renal disease: Secondary | ICD-10-CM | POA: Diagnosis not present

## 2020-08-07 DIAGNOSIS — K922 Gastrointestinal hemorrhage, unspecified: Secondary | ICD-10-CM | POA: Diagnosis not present

## 2020-08-07 DIAGNOSIS — N2581 Secondary hyperparathyroidism of renal origin: Secondary | ICD-10-CM | POA: Diagnosis not present

## 2020-08-07 DIAGNOSIS — I120 Hypertensive chronic kidney disease with stage 5 chronic kidney disease or end stage renal disease: Secondary | ICD-10-CM | POA: Diagnosis not present

## 2020-08-07 DIAGNOSIS — J449 Chronic obstructive pulmonary disease, unspecified: Secondary | ICD-10-CM | POA: Diagnosis not present

## 2020-08-07 DIAGNOSIS — D509 Iron deficiency anemia, unspecified: Secondary | ICD-10-CM | POA: Diagnosis not present

## 2020-08-07 DIAGNOSIS — Z992 Dependence on renal dialysis: Secondary | ICD-10-CM | POA: Diagnosis not present

## 2020-08-07 DIAGNOSIS — D689 Coagulation defect, unspecified: Secondary | ICD-10-CM | POA: Diagnosis not present

## 2020-08-07 DIAGNOSIS — R519 Headache, unspecified: Secondary | ICD-10-CM | POA: Diagnosis not present

## 2020-08-07 DIAGNOSIS — D62 Acute posthemorrhagic anemia: Secondary | ICD-10-CM | POA: Diagnosis not present

## 2020-08-10 DIAGNOSIS — Z992 Dependence on renal dialysis: Secondary | ICD-10-CM | POA: Diagnosis not present

## 2020-08-10 DIAGNOSIS — D689 Coagulation defect, unspecified: Secondary | ICD-10-CM | POA: Diagnosis not present

## 2020-08-10 DIAGNOSIS — D62 Acute posthemorrhagic anemia: Secondary | ICD-10-CM | POA: Diagnosis not present

## 2020-08-10 DIAGNOSIS — N186 End stage renal disease: Secondary | ICD-10-CM | POA: Diagnosis not present

## 2020-08-10 DIAGNOSIS — Z4901 Encounter for fitting and adjustment of extracorporeal dialysis catheter: Secondary | ICD-10-CM | POA: Diagnosis not present

## 2020-08-10 DIAGNOSIS — D509 Iron deficiency anemia, unspecified: Secondary | ICD-10-CM | POA: Diagnosis not present

## 2020-08-10 DIAGNOSIS — R519 Headache, unspecified: Secondary | ICD-10-CM | POA: Diagnosis not present

## 2020-08-10 DIAGNOSIS — N2581 Secondary hyperparathyroidism of renal origin: Secondary | ICD-10-CM | POA: Diagnosis not present

## 2020-08-10 DIAGNOSIS — J449 Chronic obstructive pulmonary disease, unspecified: Secondary | ICD-10-CM | POA: Diagnosis not present

## 2020-08-10 DIAGNOSIS — I120 Hypertensive chronic kidney disease with stage 5 chronic kidney disease or end stage renal disease: Secondary | ICD-10-CM | POA: Diagnosis not present

## 2020-08-10 DIAGNOSIS — K922 Gastrointestinal hemorrhage, unspecified: Secondary | ICD-10-CM | POA: Diagnosis not present

## 2020-08-11 NOTE — Telephone Encounter (Signed)
Patient states that she had PT yesterday and goes again Thursday.  Patient still doing PT.

## 2020-08-12 DIAGNOSIS — R519 Headache, unspecified: Secondary | ICD-10-CM | POA: Diagnosis not present

## 2020-08-12 DIAGNOSIS — Z4901 Encounter for fitting and adjustment of extracorporeal dialysis catheter: Secondary | ICD-10-CM | POA: Diagnosis not present

## 2020-08-12 DIAGNOSIS — D689 Coagulation defect, unspecified: Secondary | ICD-10-CM | POA: Diagnosis not present

## 2020-08-12 DIAGNOSIS — N2581 Secondary hyperparathyroidism of renal origin: Secondary | ICD-10-CM | POA: Diagnosis not present

## 2020-08-12 DIAGNOSIS — Z992 Dependence on renal dialysis: Secondary | ICD-10-CM | POA: Diagnosis not present

## 2020-08-12 DIAGNOSIS — N186 End stage renal disease: Secondary | ICD-10-CM | POA: Diagnosis not present

## 2020-08-12 DIAGNOSIS — D509 Iron deficiency anemia, unspecified: Secondary | ICD-10-CM | POA: Diagnosis not present

## 2020-08-13 DIAGNOSIS — N186 End stage renal disease: Secondary | ICD-10-CM | POA: Diagnosis not present

## 2020-08-13 DIAGNOSIS — K922 Gastrointestinal hemorrhage, unspecified: Secondary | ICD-10-CM | POA: Diagnosis not present

## 2020-08-13 DIAGNOSIS — J449 Chronic obstructive pulmonary disease, unspecified: Secondary | ICD-10-CM | POA: Diagnosis not present

## 2020-08-13 DIAGNOSIS — I120 Hypertensive chronic kidney disease with stage 5 chronic kidney disease or end stage renal disease: Secondary | ICD-10-CM | POA: Diagnosis not present

## 2020-08-13 DIAGNOSIS — D62 Acute posthemorrhagic anemia: Secondary | ICD-10-CM | POA: Diagnosis not present

## 2020-08-14 DIAGNOSIS — N2581 Secondary hyperparathyroidism of renal origin: Secondary | ICD-10-CM | POA: Diagnosis not present

## 2020-08-14 DIAGNOSIS — Z4901 Encounter for fitting and adjustment of extracorporeal dialysis catheter: Secondary | ICD-10-CM | POA: Diagnosis not present

## 2020-08-14 DIAGNOSIS — D509 Iron deficiency anemia, unspecified: Secondary | ICD-10-CM | POA: Diagnosis not present

## 2020-08-14 DIAGNOSIS — I10 Essential (primary) hypertension: Secondary | ICD-10-CM | POA: Diagnosis not present

## 2020-08-14 DIAGNOSIS — R519 Headache, unspecified: Secondary | ICD-10-CM | POA: Diagnosis not present

## 2020-08-14 DIAGNOSIS — Z992 Dependence on renal dialysis: Secondary | ICD-10-CM | POA: Diagnosis not present

## 2020-08-14 DIAGNOSIS — N186 End stage renal disease: Secondary | ICD-10-CM | POA: Diagnosis not present

## 2020-08-14 DIAGNOSIS — D689 Coagulation defect, unspecified: Secondary | ICD-10-CM | POA: Diagnosis not present

## 2020-08-17 DIAGNOSIS — D631 Anemia in chronic kidney disease: Secondary | ICD-10-CM | POA: Diagnosis not present

## 2020-08-17 DIAGNOSIS — Z4901 Encounter for fitting and adjustment of extracorporeal dialysis catheter: Secondary | ICD-10-CM | POA: Diagnosis not present

## 2020-08-17 DIAGNOSIS — D509 Iron deficiency anemia, unspecified: Secondary | ICD-10-CM | POA: Diagnosis not present

## 2020-08-17 DIAGNOSIS — D689 Coagulation defect, unspecified: Secondary | ICD-10-CM | POA: Diagnosis not present

## 2020-08-17 DIAGNOSIS — N2581 Secondary hyperparathyroidism of renal origin: Secondary | ICD-10-CM | POA: Diagnosis not present

## 2020-08-17 DIAGNOSIS — N186 End stage renal disease: Secondary | ICD-10-CM | POA: Diagnosis not present

## 2020-08-17 DIAGNOSIS — Z992 Dependence on renal dialysis: Secondary | ICD-10-CM | POA: Diagnosis not present

## 2020-08-17 DIAGNOSIS — R519 Headache, unspecified: Secondary | ICD-10-CM | POA: Diagnosis not present

## 2020-08-18 ENCOUNTER — Encounter: Payer: Self-pay | Admitting: Family Medicine

## 2020-08-18 ENCOUNTER — Ambulatory Visit (INDEPENDENT_AMBULATORY_CARE_PROVIDER_SITE_OTHER): Payer: Medicare Other

## 2020-08-18 ENCOUNTER — Other Ambulatory Visit: Payer: Self-pay

## 2020-08-18 ENCOUNTER — Ambulatory Visit (INDEPENDENT_AMBULATORY_CARE_PROVIDER_SITE_OTHER): Payer: Medicare Other | Admitting: Family Medicine

## 2020-08-18 VITALS — BP 146/82 | HR 80 | Temp 97.5°F | Resp 20 | Ht 62.0 in | Wt 137.0 lb

## 2020-08-18 DIAGNOSIS — M1990 Unspecified osteoarthritis, unspecified site: Secondary | ICD-10-CM

## 2020-08-18 DIAGNOSIS — M25522 Pain in left elbow: Secondary | ICD-10-CM | POA: Diagnosis not present

## 2020-08-18 DIAGNOSIS — E039 Hypothyroidism, unspecified: Secondary | ICD-10-CM

## 2020-08-18 DIAGNOSIS — L299 Pruritus, unspecified: Secondary | ICD-10-CM

## 2020-08-18 DIAGNOSIS — Z8719 Personal history of other diseases of the digestive system: Secondary | ICD-10-CM | POA: Diagnosis not present

## 2020-08-18 DIAGNOSIS — I10 Essential (primary) hypertension: Secondary | ICD-10-CM

## 2020-08-18 DIAGNOSIS — G8929 Other chronic pain: Secondary | ICD-10-CM

## 2020-08-18 DIAGNOSIS — M546 Pain in thoracic spine: Secondary | ICD-10-CM | POA: Diagnosis not present

## 2020-08-18 DIAGNOSIS — Z79899 Other long term (current) drug therapy: Secondary | ICD-10-CM

## 2020-08-18 DIAGNOSIS — M7989 Other specified soft tissue disorders: Secondary | ICD-10-CM | POA: Diagnosis not present

## 2020-08-18 MED ORDER — HYDROCODONE-ACETAMINOPHEN 5-325 MG PO TABS: 1.0000 | ORAL_TABLET | Freq: Four times a day (QID) | ORAL | 0 refills | Status: DC | PRN

## 2020-08-18 MED ORDER — LEVOTHYROXINE SODIUM 112 MCG PO TABS: 112.0000 ug | ORAL_TABLET | Freq: Every day | ORAL | 1 refills | Status: DC

## 2020-08-18 MED ORDER — METHOCARBAMOL 500 MG PO TABS: 500.0000 mg | ORAL_TABLET | Freq: Three times a day (TID) | ORAL | 2 refills | Status: DC | PRN

## 2020-08-18 MED ORDER — NYSTATIN 100000 UNIT/GM EX CREA: 1.0000 "application " | TOPICAL_CREAM | Freq: Two times a day (BID) | CUTANEOUS | 2 refills | Status: DC | PRN

## 2020-08-18 MED ORDER — LEVOCETIRIZINE DIHYDROCHLORIDE 5 MG PO TABS: 5.0000 mg | ORAL_TABLET | Freq: Every evening | ORAL | 2 refills | Status: DC

## 2020-08-18 NOTE — Patient Instructions (Signed)
Cereve Anti-Itch lotion

## 2020-08-18 NOTE — Progress Notes (Signed)
Assessment & Plan:  1-3. Arthritis/Chronic midline thoracic back pain/Controlled substance agreement signed Well controlled on current regimen.  Controlled substance agreement in place.  Drug screen as expected.  PDMP reviewed with no concerning findings. - HYDROcodone-acetaminophen (NORCO/VICODIN) 5-325 MG tablet; Take 1 tablet by mouth every 6 (six) hours as needed for moderate pain.  Dispense: 60 tablet; Refill: 0 - CMP14+EGFR - HYDROcodone-acetaminophen (NORCO/VICODIN) 5-325 MG tablet; Take 1 tablet by mouth every 6 (six) hours as needed for moderate pain.  Dispense: 60 tablet; Refill: 0 - HYDROcodone-acetaminophen (NORCO/VICODIN) 5-325 MG tablet; Take 1 tablet by mouth every 6 (six) hours as needed for moderate pain.  Dispense: 60 tablet; Refill: 0  4. Primary hypertension Elevated today, but patient reports good control at home. - CBC with Differential/Platelet - CMP14+EGFR  5. Left elbow pain - DG Elbow 2 Views Left  6. Pruritus Zyrtec changed to Xyzal.  Encouraged use of CeraVe anti-itch lotion. - CBC with Differential/Platelet - levocetirizine (XYZAL) 5 MG tablet; Take 1 tablet (5 mg total) by mouth every evening.  Dispense: 30 tablet; Refill: 2  7. Hypothyroidism, unspecified type Well controlled on current regimen.  - levothyroxine (SYNTHROID) 112 MCG tablet; Take 1 tablet (112 mcg total) by mouth daily.  Dispense: 90 tablet; Refill: 1 - TSH - T4, free  8. History of GI bleed No further episodes of bleeding.   Return in about 3 months (around 11/18/2020) for follow-up of chronic medication conditions.  [name], MSN, APRN, FNP-C [facility]  Subjective:    Patient ID: [name], female    DOB: [birth year], 80 y.o.   MRN: [id number]  Patient Care Team: [name], FNP as PCP - General (Family Medicine) Minus Breeding, MD as Consulting Physician (Cardiology) Tanda Rockers, MD as Consulting Physician (Pulmonary  Disease) Jamal Maes, MD as Consulting Physician (Nephrology)   Chief Complaint:  Chief Complaint  Patient presents with  . Medical Management of Chronic Issues    HPI: [name] is a 80 y.o. female presenting on 08/18/2020 for Medical Management of Chronic Issues  Patient is accompanied by her daughter-in-law who she is okay with being present.  Pain assessment: Cause of pain- arthritis, multiple recent fractures Pain location- back, knees, hips Pain on scale of 1-10- 6/10 Frequency- daily What increases pain- activity What makes pain better- medication Effects on ADL- unable to complete ADLs without assistance Any change in general medical condition- no  Current opioids rx- Norco 5/325 PO every 6 hours as needed # meds rx- 30 Effectiveness of current meds- effective Adverse reactions from pain meds- none Morphine equivalent- 20 MME/day  Pill count performed-No Last drug screen - 05/19/2020 ( high risk q37m moderate risk q632mlow risk yearly ) Drug screen today- No [place]  If yes were their any concerning findings? - No  Overdose risk: 210  Opioid Risk  09/04/2019  Alcohol 0  Illegal Drugs 0  Rx Drugs 0  Alcohol 0  Illegal Drugs 0  Rx Drugs 0  Age between 16-45 years  0  History of Preadolescent Sexual Abuse 0  Psychological Disease 0  Depression 0  Opioid Risk Tool Scoring 0  Opioid Risk Interpretation Low Risk   Pain contract signed on: 05/19/2020   Hypertension: BP slightly elevated today.  Patient does check it at home and reports good readings.  Daughter-in-law reports her hemoglobin has been staying in the 10s at dialysis.  She has had no further episodes  of bleeding.  New complaints: Patient reports left elbow pain x2-3 weeks since her last fall.  She is doing home health physical therapy. She and her daughter-in-law feel she is improving.   Also reports her skin is very itchy.  She does take Zyrtec on a daily  basis.   Social history:  Relevant past medical, surgical, family and social history reviewed and updated as indicated. Interim medical history since our last visit reviewed.  Allergies and medications reviewed and updated.  DATA REVIEWED: CHART IN EPIC  ROS: Negative unless specifically indicated above in HPI.    Current Outpatient Medications:  .  acetaminophen (TYLENOL) 500 MG tablet, Take 2 tablets (1,000 mg total) by mouth every 8 (eight) hours as needed for mild pain or headache., Disp: 30 tablet, Rfl: 0 .  albuterol (VENTOLIN HFA) 108 (90 Base) MCG/ACT inhaler, Inhale 2 puffs into the lungs every 6 (six) hours as needed for wheezing or shortness of breath., Disp: 18 g, Rfl: 2 .  alendronate (FOSAMAX) 70 MG tablet, Take 1 tablet (70 mg total) by mouth every Thursday., Disp: 12 tablet, Rfl: 3 .  AURYXIA 1 GM 210 MG(Fe) tablet, Take 210 mg by mouth 3 (three) times daily with meals., Disp: , Rfl:  .  budesonide-formoterol (SYMBICORT) 80-4.5 MCG/ACT inhaler, Inhale 2 puffs into the lungs 2 (two) times daily., Disp: 3 each, Rfl: 4 .  cinacalcet (SENSIPAR) 30 MG tablet, Take 1 tablet (30 mg total) by mouth every Monday, Wednesday, and Friday., Disp: 60 tablet, Rfl: 0 .  docusate calcium (SURFAK) 240 MG capsule, Take 1 capsule (240 mg total) by mouth daily., Disp: 90 capsule, Rfl: 1 .  doxercalciferol (HECTOROL) 4 MCG/2ML injection, Inject 0.5 mLs (1 mcg total) into the vein every Monday, Wednesday, and Friday with hemodialysis., Disp: 2 mL, Rfl: 0 .  ezetimibe (ZETIA) 10 MG tablet, Take 1 tablet (10 mg total) by mouth daily., Disp: 90 tablet, Rfl: 3 .  famotidine (PEPCID) 20 MG tablet, Take 1 tablet (20 mg total) by mouth 2 (two) times daily., Disp: 180 tablet, Rfl: 1 .  HYDROcodone-acetaminophen (NORCO/VICODIN) 5-325 MG tablet, Take 1 tablet by mouth every 6 (six) hours as needed for moderate pain., Disp: 30 tablet, Rfl: 0 .  levothyroxine (SYNTHROID) 112 MCG tablet, TAKE 1 TABLET BY MOUTH  EVERY DAY (Patient taking differently: Take 112 mcg by mouth daily before breakfast.), Disp: 90 tablet, Rfl: 0 .  multivitamin (RENA-VIT) TABS tablet, Take 1 tablet by mouth daily., Disp: , Rfl:  .  nystatin cream (MYCOSTATIN), Apply 1 application topically 2 (two) times daily as needed for dry skin., Disp: 30 g, Rfl: 0 .  pantoprazole (PROTONIX) 20 MG tablet, Take 1 tablet (20 mg total) by mouth daily., Disp: 90 tablet, Rfl: 1 .  pramipexole (MIRAPEX) 0.125 MG tablet, Take 1 tablet (0.125 mg total) by mouth at bedtime., Disp: 90 tablet, Rfl: 1 .  Vitamin D, Ergocalciferol, (DRISDOL) 1.25 MG (50000 UNIT) CAPS capsule, Take 1 capsule (50,000 Units total) by mouth every Monday., Disp: 12 capsule, Rfl: 3   Allergies  Allergen Reactions  . Amoxicillin Rash    Has patient had a PCN reaction causing immediate rash, facial/tongue/throat swelling, SOB or lightheadedness with hypotension:YES Has patient had a PCN reaction causing severe rash involving mucus membranes or skin necrosis: Yes Has patient had a PCN reaction that required hospitalization No Has patient had a PCN reaction occurring within the last 10 years: Yes If all of the above answers are "NO", then  may proceed with Cephalosporin use.   . Elemental Sulfur Hives  . Sulfur Hives  . Peg 3350-Electrolytes Other (See Comments)  . Sulfa Drugs Cross Reactors Hives and Itching  . Miralax [Polyethylene Glycol] Itching  . Mixed Grasses   . Percocet [Oxycodone-Acetaminophen] Itching and Rash    Did not happen last time she took it  . Sulfa Antibiotics Hives   Past Medical History:  Diagnosis Date  . Acute respiratory failure ([place]) 05/2017  . Anemia   . Anxiety   . Arthritis   . COPD (chronic obstructive pulmonary disease) ([place])   . Depression   . ESRD (end stage renal disease) ([place])    dialysis MWF  . GERD (gastroesophageal reflux disease)   . Gout   . History of blood transfusion   . History of diabetes mellitus    "diet  controlled"  . HLD (hyperlipidemia)   . HOH (hard of hearing)    left ear  . Hypertension    hypotensive -since starting dialysis  . Hypothyroidism   . MRSA (methicillin resistant staph aureus) culture positive 06/01/2017  . PAF (paroxysmal atrial fibrillation) ([place])    a. Echo 11/16:  Mild LVH, EF 55-60%, normal wall motion, MAC, mild MR, severe LAE (49 ml/m2), mild RVE, normal RVSF, mild RAE, mild TR, PASP 24 mmHg;  CHADS2-VASc: 4 >> Coumadin followed by PCP    Past Surgical History:  Procedure Laterality Date  . AV FISTULA PLACEMENT  04/03/2012   Procedure: ARTERIOVENOUS (AV) FISTULA CREATION;  Surgeon: Elam Dutch, MD;  Location: [place];  Service: Vascular;  Laterality: Left;  creation left brachial cephalic fistula   . BIOPSY  08/21/2018   Procedure: BIOPSY;  Surgeon: Thornton Park, MD;  Location: [place];  Service: Gastroenterology;;  . CARPAL TUNNEL RELEASE Bilateral   . COLONOSCOPY W/ POLYPECTOMY    . COLONOSCOPY WITH PROPOFOL N/A 08/21/2018   Procedure: COLONOSCOPY WITH PROPOFOL;  Surgeon: Thornton Park, MD;  Location: [place];  Service: Gastroenterology;  Laterality: N/A;  . DILATION AND CURETTAGE OF UTERUS    . ESOPHAGOGASTRODUODENOSCOPY (EGD) WITH PROPOFOL N/A 08/21/2018   Procedure: ESOPHAGOGASTRODUODENOSCOPY (EGD) WITH PROPOFOL;  Surgeon: Thornton Park, MD;  Location: [place];  Service: Gastroenterology;  Laterality: N/A;  . EYE SURGERY Bilateral    bilateral cataract removal  . HEMATOMA EVACUATION Left 05/14/2018   Procedure: Incision and Drainage of Left Arm Hematoma;  Surgeon: Elam Dutch, MD;  Location: [facility] OR;  Service: Vascular;  Laterality: Left;  . Hemodialysis  catheter Right   . IR FLUORO GUIDE CV LINE RIGHT  05/26/2017  . IR REMOVAL TUN CV CATH W/O FL  05/24/2017  . IR US GUIDE VASC ACCESS RIGHT  05/26/2017  . LIGATION OF ARTERIOVENOUS  FISTULA Left 02/04/2015   Procedure: LIGATION OF BRACHIOCEPHALIC ARTERIOVENOUS  FISTULA;   Surgeon: Conrad Pollard, MD;  Location: [place];  Service: Vascular;  Laterality: Left;  Marland Kitchen MULTIPLE TOOTH EXTRACTIONS    . PARTIAL HIP ARTHROPLASTY    . POLYPECTOMY  08/21/2018   Procedure: POLYPECTOMY;  Surgeon: Thornton Park, MD;  Location: [place];  Service: Gastroenterology;;  . PORTACATH PLACEMENT    . REVERSE SHOULDER ARTHROPLASTY Left 01/22/2016   Procedure: LEFT REVERSE SHOULDER ARTHROPLASTY;  Surgeon: Netta Cedars, MD;  Location: [place];  Service: Orthopedics;  Laterality: Left;  . SHOULDER ARTHROSCOPY Bilateral   . SPLIT NIGHT STUDY  07/26/2015  . STERIOD INJECTION Right 01/22/2016   Procedure: RIGHT RING FINGER STEROID INJECTION;  Surgeon: Richardson Landry  Veverly Fells, MD;  Location: [place];  Service: Orthopedics;  Laterality: Right;  . TEE WITHOUT CARDIOVERSION N/A 05/26/2017   Procedure: TRANSESOPHAGEAL ECHOCARDIOGRAM (TEE);  Surgeon: Lelon Perla, MD;  Location: [place];  Service: Cardiovascular;  Laterality: N/A;  . THROMBECTOMY BRACHIAL ARTERY Left 02/06/2015   Procedure: EVACUATION OF LEFT ARM HEMATOMA;  Surgeon: Angelia Mould, MD;  Location: [place];  Service: Vascular;  Laterality: Left;  . TOTAL KNEE ARTHROPLASTY Bilateral   . TUBAL LIGATION      Social History   Socioeconomic History  . Marital status: Widowed    Spouse name: Not on file  . Number of children: 6  . Years of education: 21  . Highest education level: 10th grade  Occupational History  . Occupation: RETIRED  Tobacco Use  . Smoking status: Former Smoker    Packs/day: 0.25    Years: 2.00    Pack years: 0.50    Types: Cigarettes    Quit date: 04/04/1998    Years since quitting: 22.3  . Smokeless tobacco: Never Used  Vaping Use  . Vaping Use: Never used  Substance and Sexual Activity  . Alcohol use: No    Alcohol/week: 0.0 standard drinks  . Drug use: No  . Sexual activity: Not Currently    Birth control/protection: None  Other Topics Concern  . Not on file  Social History Narrative   **  Merged History Encounter **       Widowed 6 children Lives with son and daughter in law homemaker   Social Determinants of Health   Financial Resource Strain: Not on file  Food Insecurity: Not on file  Transportation Needs: Not on file  Physical Activity: Not on file  Stress: Not on file  Social Connections: Not on file  Intimate Partner Violence: Not on file        Objective:    BP (!) 151/83   Pulse 80   Temp (!) 97.5 F (36.4 C)   Resp 20   Ht 5' 2" (1.575 m)   Wt 137 lb (62.1 kg)   SpO2 96%   BMI 25.06 kg/m   Wt Readings from Last 3 Encounters:  08/18/20 137 lb (62.1 kg)  08/04/20 136 lb (61.7 kg)  07/17/20 137 lb 9.1 oz (62.4 kg)    Physical Exam Vitals reviewed.  Constitutional:      General: She is not in acute distress.    Appearance: Normal appearance. She is normal weight. She is not ill-appearing, toxic-appearing or diaphoretic.  HENT:     Head: Normocephalic and atraumatic.  Eyes:     General: No scleral icterus.       Right eye: No discharge.        Left eye: No discharge.     Conjunctiva/sclera: Conjunctivae normal.  Cardiovascular:     Rate and Rhythm: Normal rate. Rhythm irregular.     Heart sounds: Normal heart sounds. No murmur heard. No friction rub. No gallop.   Pulmonary:     Effort: Pulmonary effort is normal. No respiratory distress.     Breath sounds: Normal breath sounds. No stridor. No wheezing, rhonchi or rales.  Musculoskeletal:        General: Normal range of motion.     Left elbow: No swelling, deformity, effusion or lacerations. Normal range of motion. Tenderness present in olecranon process.     Cervical back: Normal range of motion.     Left knee: Deformity present.  Skin:    General:  Skin is warm and dry.     Capillary Refill: Capillary refill takes less than 2 seconds.  Neurological:     General: No focal deficit present.     Mental Status: She is alert and oriented to person, place, and time. Mental status is at  baseline.     Gait: Gait abnormal (riding [facility]  Psychiatric:        Mood and Affect: Mood normal.        Behavior: Behavior normal.        Thought Content: Thought content normal.        Judgment: Judgment normal.     Lab Results  Component Value Date   TSH 7.700 (H) 05/19/2020   Lab Results  Component Value Date   WBC 9.8 07/17/2020   HGB 8.3 (L) 07/17/2020   HCT 25.7 (L) 07/17/2020   MCV 95.9 07/17/2020   PLT 318 07/17/2020   Lab Results  Component Value Date   NA 139 07/17/2020   K 4.5 07/17/2020   CO2 27 07/17/2020   GLUCOSE 88 07/17/2020   BUN 26 (H) 07/17/2020   CREATININE 5.75 (H) 07/17/2020   BILITOT 0.7 07/15/2020   ALKPHOS 75 07/15/2020   AST 23 07/15/2020   ALT 15 07/15/2020   PROT 5.1 (L) 07/15/2020   ALBUMIN 2.5 (L) 07/15/2020   CALCIUM 8.2 (L) 07/17/2020   ANIONGAP 10 07/17/2020   Lab Results  Component Value Date   CHOL 149 05/19/2020   Lab Results  Component Value Date   HDL 77 05/19/2020   Lab Results  Component Value Date   LDLCALC 59 05/19/2020   Lab Results  Component Value Date   TRIG 66 05/19/2020   Lab Results  Component Value Date   CHOLHDL 1.9 05/19/2020   Lab Results  Component Value Date   HGBA1C 4.5 (L) 05/25/2017

## 2020-08-19 DIAGNOSIS — R519 Headache, unspecified: Secondary | ICD-10-CM | POA: Diagnosis not present

## 2020-08-19 DIAGNOSIS — N2581 Secondary hyperparathyroidism of renal origin: Secondary | ICD-10-CM | POA: Diagnosis not present

## 2020-08-19 DIAGNOSIS — Z992 Dependence on renal dialysis: Secondary | ICD-10-CM | POA: Diagnosis not present

## 2020-08-19 DIAGNOSIS — D631 Anemia in chronic kidney disease: Secondary | ICD-10-CM | POA: Diagnosis not present

## 2020-08-19 DIAGNOSIS — D689 Coagulation defect, unspecified: Secondary | ICD-10-CM | POA: Diagnosis not present

## 2020-08-19 DIAGNOSIS — D509 Iron deficiency anemia, unspecified: Secondary | ICD-10-CM | POA: Diagnosis not present

## 2020-08-19 DIAGNOSIS — N186 End stage renal disease: Secondary | ICD-10-CM | POA: Diagnosis not present

## 2020-08-19 DIAGNOSIS — Z4901 Encounter for fitting and adjustment of extracorporeal dialysis catheter: Secondary | ICD-10-CM | POA: Diagnosis not present

## 2020-08-19 LAB — CMP14+EGFR
ALT: 13 IU/L (ref 0–32)
AST: 20 IU/L (ref 0–40)
Albumin/Globulin Ratio: 1.2 (ref 1.2–2.2)
Albumin: 3.6 g/dL — ABNORMAL LOW (ref 3.7–4.7)
Alkaline Phosphatase: 93 IU/L (ref 44–121)
BUN/Creatinine Ratio: 7 — ABNORMAL LOW (ref 12–28)
BUN: 35 mg/dL — ABNORMAL HIGH (ref 8–27)
Bilirubin Total: 0.2 mg/dL (ref 0.0–1.2)
CO2: 22 mmol/L (ref 20–29)
Calcium: 9.6 mg/dL (ref 8.7–10.3)
Chloride: 103 mmol/L (ref 96–106)
Creatinine, Ser: 5.32 mg/dL (ref 0.57–1.00)
Globulin, Total: 2.9 g/dL (ref 1.5–4.5)
Glucose: 74 mg/dL (ref 65–99)
Potassium: 5.7 mmol/L — ABNORMAL HIGH (ref 3.5–5.2)
Sodium: 145 mmol/L — ABNORMAL HIGH (ref 134–144)
Total Protein: 6.5 g/dL (ref 6.0–8.5)
eGFR: 8 mL/min/{1.73_m2} — ABNORMAL LOW (ref >59)

## 2020-08-19 LAB — CBC WITH DIFFERENTIAL/PLATELET
Basophils Absolute: 0.1 10*3/uL (ref 0.0–0.2)
Basos: 1 %
EOS (ABSOLUTE): 0.4 10*3/uL (ref 0.0–0.4)
Eos: 4 %
Hematocrit: 36.8 % (ref 34.0–46.6)
Hemoglobin: 11.8 g/dL (ref 11.1–15.9)
Immature Grans (Abs): 0 10*3/uL (ref 0.0–0.1)
Immature Granulocytes: 0 %
Lymphocytes Absolute: 1.2 10*3/uL (ref 0.7–3.1)
Lymphs: 15 %
MCH: 29.2 pg (ref 26.6–33.0)
MCHC: 32.1 g/dL (ref 31.5–35.7)
MCV: 91 fL (ref 79–97)
Monocytes Absolute: 0.7 10*3/uL (ref 0.1–0.9)
Monocytes: 9 %
Neutrophils Absolute: 5.7 10*3/uL (ref 1.4–7.0)
Neutrophils: 71 %
Platelets: 305 10*3/uL (ref 150–450)
RBC: 4.04 x10E6/uL (ref 3.77–5.28)
RDW: 13.3 % (ref 11.7–15.4)
WBC: 8.2 10*3/uL (ref 3.4–10.8)

## 2020-08-19 LAB — T4, FREE: Free T4: 1.11 ng/dL (ref 0.82–1.77)

## 2020-08-19 LAB — TSH: TSH: 4.47 u[IU]/mL (ref 0.450–4.500)

## 2020-08-20 ENCOUNTER — Telehealth: Payer: Self-pay | Admitting: Family Medicine

## 2020-08-20 DIAGNOSIS — D62 Acute posthemorrhagic anemia: Secondary | ICD-10-CM | POA: Diagnosis not present

## 2020-08-20 DIAGNOSIS — N186 End stage renal disease: Secondary | ICD-10-CM | POA: Diagnosis not present

## 2020-08-20 DIAGNOSIS — K922 Gastrointestinal hemorrhage, unspecified: Secondary | ICD-10-CM | POA: Diagnosis not present

## 2020-08-20 DIAGNOSIS — I120 Hypertensive chronic kidney disease with stage 5 chronic kidney disease or end stage renal disease: Secondary | ICD-10-CM | POA: Diagnosis not present

## 2020-08-20 DIAGNOSIS — J449 Chronic obstructive pulmonary disease, unspecified: Secondary | ICD-10-CM | POA: Diagnosis not present

## 2020-08-20 NOTE — Telephone Encounter (Signed)
Report is now in chart - please review

## 2020-08-20 NOTE — Telephone Encounter (Signed)
Please review patients xray result and call daughter [name]) [phone]

## 2020-08-20 NOTE — Telephone Encounter (Signed)
Addressed in results via [place].

## 2020-08-21 DIAGNOSIS — N2581 Secondary hyperparathyroidism of renal origin: Secondary | ICD-10-CM | POA: Diagnosis not present

## 2020-08-21 DIAGNOSIS — Z4901 Encounter for fitting and adjustment of extracorporeal dialysis catheter: Secondary | ICD-10-CM | POA: Diagnosis not present

## 2020-08-21 DIAGNOSIS — Z992 Dependence on renal dialysis: Secondary | ICD-10-CM | POA: Diagnosis not present

## 2020-08-21 DIAGNOSIS — D631 Anemia in chronic kidney disease: Secondary | ICD-10-CM | POA: Diagnosis not present

## 2020-08-21 DIAGNOSIS — D689 Coagulation defect, unspecified: Secondary | ICD-10-CM | POA: Diagnosis not present

## 2020-08-21 DIAGNOSIS — N186 End stage renal disease: Secondary | ICD-10-CM | POA: Diagnosis not present

## 2020-08-21 DIAGNOSIS — D509 Iron deficiency anemia, unspecified: Secondary | ICD-10-CM | POA: Diagnosis not present

## 2020-08-21 DIAGNOSIS — R519 Headache, unspecified: Secondary | ICD-10-CM | POA: Diagnosis not present

## 2020-08-24 DIAGNOSIS — R519 Headache, unspecified: Secondary | ICD-10-CM | POA: Diagnosis not present

## 2020-08-24 DIAGNOSIS — N186 End stage renal disease: Secondary | ICD-10-CM | POA: Diagnosis not present

## 2020-08-24 DIAGNOSIS — J9621 Acute and chronic respiratory failure with hypoxia: Secondary | ICD-10-CM | POA: Diagnosis not present

## 2020-08-24 DIAGNOSIS — D689 Coagulation defect, unspecified: Secondary | ICD-10-CM | POA: Diagnosis not present

## 2020-08-24 DIAGNOSIS — M1990 Unspecified osteoarthritis, unspecified site: Secondary | ICD-10-CM | POA: Diagnosis not present

## 2020-08-24 DIAGNOSIS — N2581 Secondary hyperparathyroidism of renal origin: Secondary | ICD-10-CM | POA: Diagnosis not present

## 2020-08-24 DIAGNOSIS — Z992 Dependence on renal dialysis: Secondary | ICD-10-CM | POA: Diagnosis not present

## 2020-08-24 DIAGNOSIS — Z4901 Encounter for fitting and adjustment of extracorporeal dialysis catheter: Secondary | ICD-10-CM | POA: Diagnosis not present

## 2020-08-24 DIAGNOSIS — N185 Chronic kidney disease, stage 5: Secondary | ICD-10-CM | POA: Diagnosis not present

## 2020-08-24 DIAGNOSIS — I480 Paroxysmal atrial fibrillation: Secondary | ICD-10-CM | POA: Diagnosis not present

## 2020-08-24 DIAGNOSIS — D509 Iron deficiency anemia, unspecified: Secondary | ICD-10-CM | POA: Diagnosis not present

## 2020-08-25 DIAGNOSIS — I120 Hypertensive chronic kidney disease with stage 5 chronic kidney disease or end stage renal disease: Secondary | ICD-10-CM | POA: Diagnosis not present

## 2020-08-25 DIAGNOSIS — N186 End stage renal disease: Secondary | ICD-10-CM | POA: Diagnosis not present

## 2020-08-25 DIAGNOSIS — J449 Chronic obstructive pulmonary disease, unspecified: Secondary | ICD-10-CM | POA: Diagnosis not present

## 2020-08-25 DIAGNOSIS — K922 Gastrointestinal hemorrhage, unspecified: Secondary | ICD-10-CM | POA: Diagnosis not present

## 2020-08-25 DIAGNOSIS — D62 Acute posthemorrhagic anemia: Secondary | ICD-10-CM | POA: Diagnosis not present

## 2020-08-26 DIAGNOSIS — R519 Headache, unspecified: Secondary | ICD-10-CM | POA: Diagnosis not present

## 2020-08-26 DIAGNOSIS — N2581 Secondary hyperparathyroidism of renal origin: Secondary | ICD-10-CM | POA: Diagnosis not present

## 2020-08-26 DIAGNOSIS — N186 End stage renal disease: Secondary | ICD-10-CM | POA: Diagnosis not present

## 2020-08-26 DIAGNOSIS — Z992 Dependence on renal dialysis: Secondary | ICD-10-CM | POA: Diagnosis not present

## 2020-08-26 DIAGNOSIS — D509 Iron deficiency anemia, unspecified: Secondary | ICD-10-CM | POA: Diagnosis not present

## 2020-08-26 DIAGNOSIS — D689 Coagulation defect, unspecified: Secondary | ICD-10-CM | POA: Diagnosis not present

## 2020-08-26 DIAGNOSIS — Z4901 Encounter for fitting and adjustment of extracorporeal dialysis catheter: Secondary | ICD-10-CM | POA: Diagnosis not present

## 2020-08-27 DIAGNOSIS — N186 End stage renal disease: Secondary | ICD-10-CM | POA: Diagnosis not present

## 2020-08-27 DIAGNOSIS — D62 Acute posthemorrhagic anemia: Secondary | ICD-10-CM | POA: Diagnosis not present

## 2020-08-27 DIAGNOSIS — J449 Chronic obstructive pulmonary disease, unspecified: Secondary | ICD-10-CM | POA: Diagnosis not present

## 2020-08-27 DIAGNOSIS — I480 Paroxysmal atrial fibrillation: Secondary | ICD-10-CM | POA: Diagnosis not present

## 2020-08-27 DIAGNOSIS — I120 Hypertensive chronic kidney disease with stage 5 chronic kidney disease or end stage renal disease: Secondary | ICD-10-CM | POA: Diagnosis not present

## 2020-08-27 DIAGNOSIS — K922 Gastrointestinal hemorrhage, unspecified: Secondary | ICD-10-CM | POA: Diagnosis not present

## 2020-08-28 DIAGNOSIS — Z4901 Encounter for fitting and adjustment of extracorporeal dialysis catheter: Secondary | ICD-10-CM | POA: Diagnosis not present

## 2020-08-28 DIAGNOSIS — D509 Iron deficiency anemia, unspecified: Secondary | ICD-10-CM | POA: Diagnosis not present

## 2020-08-28 DIAGNOSIS — D689 Coagulation defect, unspecified: Secondary | ICD-10-CM | POA: Diagnosis not present

## 2020-08-28 DIAGNOSIS — R519 Headache, unspecified: Secondary | ICD-10-CM | POA: Diagnosis not present

## 2020-08-28 DIAGNOSIS — Z992 Dependence on renal dialysis: Secondary | ICD-10-CM | POA: Diagnosis not present

## 2020-08-28 DIAGNOSIS — N2581 Secondary hyperparathyroidism of renal origin: Secondary | ICD-10-CM | POA: Diagnosis not present

## 2020-08-28 DIAGNOSIS — N186 End stage renal disease: Secondary | ICD-10-CM | POA: Diagnosis not present

## 2020-08-31 DIAGNOSIS — Z4901 Encounter for fitting and adjustment of extracorporeal dialysis catheter: Secondary | ICD-10-CM | POA: Diagnosis not present

## 2020-08-31 DIAGNOSIS — D689 Coagulation defect, unspecified: Secondary | ICD-10-CM | POA: Diagnosis not present

## 2020-08-31 DIAGNOSIS — Z992 Dependence on renal dialysis: Secondary | ICD-10-CM | POA: Diagnosis not present

## 2020-08-31 DIAGNOSIS — N186 End stage renal disease: Secondary | ICD-10-CM | POA: Diagnosis not present

## 2020-08-31 DIAGNOSIS — N2581 Secondary hyperparathyroidism of renal origin: Secondary | ICD-10-CM | POA: Diagnosis not present

## 2020-09-01 DIAGNOSIS — K922 Gastrointestinal hemorrhage, unspecified: Secondary | ICD-10-CM | POA: Diagnosis not present

## 2020-09-01 DIAGNOSIS — J449 Chronic obstructive pulmonary disease, unspecified: Secondary | ICD-10-CM | POA: Diagnosis not present

## 2020-09-01 DIAGNOSIS — N186 End stage renal disease: Secondary | ICD-10-CM | POA: Diagnosis not present

## 2020-09-01 DIAGNOSIS — E1122 Type 2 diabetes mellitus with diabetic chronic kidney disease: Secondary | ICD-10-CM | POA: Diagnosis not present

## 2020-09-01 DIAGNOSIS — D62 Acute posthemorrhagic anemia: Secondary | ICD-10-CM | POA: Diagnosis not present

## 2020-09-01 DIAGNOSIS — Z992 Dependence on renal dialysis: Secondary | ICD-10-CM | POA: Diagnosis not present

## 2020-09-01 DIAGNOSIS — I120 Hypertensive chronic kidney disease with stage 5 chronic kidney disease or end stage renal disease: Secondary | ICD-10-CM | POA: Diagnosis not present

## 2020-09-02 DIAGNOSIS — Z992 Dependence on renal dialysis: Secondary | ICD-10-CM | POA: Diagnosis not present

## 2020-09-02 DIAGNOSIS — Z4901 Encounter for fitting and adjustment of extracorporeal dialysis catheter: Secondary | ICD-10-CM | POA: Diagnosis not present

## 2020-09-02 DIAGNOSIS — D689 Coagulation defect, unspecified: Secondary | ICD-10-CM | POA: Diagnosis not present

## 2020-09-02 DIAGNOSIS — D509 Iron deficiency anemia, unspecified: Secondary | ICD-10-CM | POA: Diagnosis not present

## 2020-09-02 DIAGNOSIS — N2581 Secondary hyperparathyroidism of renal origin: Secondary | ICD-10-CM | POA: Diagnosis not present

## 2020-09-02 DIAGNOSIS — R519 Headache, unspecified: Secondary | ICD-10-CM | POA: Diagnosis not present

## 2020-09-02 DIAGNOSIS — N186 End stage renal disease: Secondary | ICD-10-CM | POA: Diagnosis not present

## 2020-09-03 DIAGNOSIS — M79676 Pain in unspecified toe(s): Secondary | ICD-10-CM | POA: Diagnosis not present

## 2020-09-03 DIAGNOSIS — I70203 Unspecified atherosclerosis of native arteries of extremities, bilateral legs: Secondary | ICD-10-CM | POA: Diagnosis not present

## 2020-09-03 DIAGNOSIS — B351 Tinea unguium: Secondary | ICD-10-CM | POA: Diagnosis not present

## 2020-09-03 DIAGNOSIS — L84 Corns and callosities: Secondary | ICD-10-CM | POA: Diagnosis not present

## 2020-09-04 DIAGNOSIS — D689 Coagulation defect, unspecified: Secondary | ICD-10-CM | POA: Diagnosis not present

## 2020-09-04 DIAGNOSIS — D509 Iron deficiency anemia, unspecified: Secondary | ICD-10-CM | POA: Diagnosis not present

## 2020-09-04 DIAGNOSIS — J449 Chronic obstructive pulmonary disease, unspecified: Secondary | ICD-10-CM | POA: Diagnosis not present

## 2020-09-04 DIAGNOSIS — N2581 Secondary hyperparathyroidism of renal origin: Secondary | ICD-10-CM | POA: Diagnosis not present

## 2020-09-04 DIAGNOSIS — K922 Gastrointestinal hemorrhage, unspecified: Secondary | ICD-10-CM | POA: Diagnosis not present

## 2020-09-04 DIAGNOSIS — D62 Acute posthemorrhagic anemia: Secondary | ICD-10-CM | POA: Diagnosis not present

## 2020-09-04 DIAGNOSIS — N186 End stage renal disease: Secondary | ICD-10-CM | POA: Diagnosis not present

## 2020-09-04 DIAGNOSIS — I120 Hypertensive chronic kidney disease with stage 5 chronic kidney disease or end stage renal disease: Secondary | ICD-10-CM | POA: Diagnosis not present

## 2020-09-04 DIAGNOSIS — Z992 Dependence on renal dialysis: Secondary | ICD-10-CM | POA: Diagnosis not present

## 2020-09-04 DIAGNOSIS — R519 Headache, unspecified: Secondary | ICD-10-CM | POA: Diagnosis not present

## 2020-09-04 DIAGNOSIS — Z4901 Encounter for fitting and adjustment of extracorporeal dialysis catheter: Secondary | ICD-10-CM | POA: Diagnosis not present

## 2020-09-07 DIAGNOSIS — D509 Iron deficiency anemia, unspecified: Secondary | ICD-10-CM | POA: Diagnosis not present

## 2020-09-07 DIAGNOSIS — Z4901 Encounter for fitting and adjustment of extracorporeal dialysis catheter: Secondary | ICD-10-CM | POA: Diagnosis not present

## 2020-09-07 DIAGNOSIS — Z992 Dependence on renal dialysis: Secondary | ICD-10-CM | POA: Diagnosis not present

## 2020-09-07 DIAGNOSIS — N186 End stage renal disease: Secondary | ICD-10-CM | POA: Diagnosis not present

## 2020-09-07 DIAGNOSIS — N2581 Secondary hyperparathyroidism of renal origin: Secondary | ICD-10-CM | POA: Diagnosis not present

## 2020-09-07 DIAGNOSIS — D689 Coagulation defect, unspecified: Secondary | ICD-10-CM | POA: Diagnosis not present

## 2020-09-08 DIAGNOSIS — N186 End stage renal disease: Secondary | ICD-10-CM | POA: Diagnosis not present

## 2020-09-08 DIAGNOSIS — K922 Gastrointestinal hemorrhage, unspecified: Secondary | ICD-10-CM | POA: Diagnosis not present

## 2020-09-08 DIAGNOSIS — D62 Acute posthemorrhagic anemia: Secondary | ICD-10-CM | POA: Diagnosis not present

## 2020-09-08 DIAGNOSIS — I120 Hypertensive chronic kidney disease with stage 5 chronic kidney disease or end stage renal disease: Secondary | ICD-10-CM | POA: Diagnosis not present

## 2020-09-08 DIAGNOSIS — J449 Chronic obstructive pulmonary disease, unspecified: Secondary | ICD-10-CM | POA: Diagnosis not present

## 2020-09-08 NOTE — Progress Notes (Signed)
Cardiology Office Note   Date:  09/09/2020   ID:  [name], DOB 02-Apr-1941, MRN [id number]  PCP:  [name], FNP  Cardiologist:   None  No chief complaint on file.     History of Present Illness: [name] is a 80 y.o. female who presents for ongoing assessment and management of atrial fibrillation, hypertension, and hyperlipidemia.  Since we last saw her she was admitted with GI bleed.  I reviewed these records for this visit.  She was transfused because she had previous GI work-up but does not look like another one was initiated.  Her hemoglobin had gotten down to 7 but was actually stable at 8.2 when checked in mid last month.  She gets around with a wheelchair when she has to go any distance or with a walker.  She has had multiple falls and has had multiple broken bones.  She has an excellent attitude.  She is tired and says she does not sleep well at night because some of the bony pain that she has.  She is not describing palpitations and has had no presyncope or syncope.  She had no new shortness of breath, PND or orthopnea.  She tolerates anticoagulation.  Of note her daughter brought her blood pressure diary and she is consistently in the 263Z to 858I systolic.  However, she has had significant problems with orthostatic hypotension in the past.   Past Medical History:  Diagnosis Date  . Acute respiratory failure ([place]) 05/2017  . Anemia   . Anxiety   . Arthritis   . COPD (chronic obstructive pulmonary disease) ([place])   . Depression   . ESRD (end stage renal disease) ([place])    dialysis MWF  . GERD (gastroesophageal reflux disease)   . Gout   . History of blood transfusion   . History of diabetes mellitus    "diet controlled"  . HLD (hyperlipidemia)   . HOH (hard of hearing)    left ear  . Hypertension    hypotensive -since starting dialysis  . Hypothyroidism   . MRSA (methicillin resistant staph aureus) culture positive 06/01/2017  . PAF (paroxysmal atrial  fibrillation) ([place])    a. Echo 11/16:  Mild LVH, EF 55-60%, normal wall motion, MAC, mild MR, severe LAE (49 ml/m2), mild RVE, normal RVSF, mild RAE, mild TR, PASP 24 mmHg;  CHADS2-VASc: 4 >> Coumadin followed by PCP    Past Surgical History:  Procedure Laterality Date  . AV FISTULA PLACEMENT  04/03/2012   Procedure: ARTERIOVENOUS (AV) FISTULA CREATION;  Surgeon: Elam Dutch, MD;  Location: [place];  Service: Vascular;  Laterality: Left;  creation left brachial cephalic fistula   . BIOPSY  08/21/2018   Procedure: BIOPSY;  Surgeon: Thornton Park, MD;  Location: [place];  Service: Gastroenterology;;  . CARPAL TUNNEL RELEASE Bilateral   . COLONOSCOPY W/ POLYPECTOMY    . COLONOSCOPY WITH PROPOFOL N/A 08/21/2018   Procedure: COLONOSCOPY WITH PROPOFOL;  Surgeon: Thornton Park, MD;  Location: [place];  Service: Gastroenterology;  Laterality: N/A;  . DILATION AND CURETTAGE OF UTERUS    . ESOPHAGOGASTRODUODENOSCOPY (EGD) WITH PROPOFOL N/A 08/21/2018   Procedure: ESOPHAGOGASTRODUODENOSCOPY (EGD) WITH PROPOFOL;  Surgeon: Thornton Park, MD;  Location: [place];  Service: Gastroenterology;  Laterality: N/A;  . EYE SURGERY Bilateral    bilateral cataract removal  . HEMATOMA EVACUATION Left 05/14/2018   Procedure: Incision and Drainage of Left Arm Hematoma;  Surgeon: Elam Dutch, MD;  Location:  [facility];  Service: Vascular;  Laterality: Left;  . Hemodialysis  catheter Right   . IR FLUORO GUIDE CV LINE RIGHT  05/26/2017  . IR REMOVAL TUN CV CATH W/O FL  05/24/2017  . IR US GUIDE VASC ACCESS RIGHT  05/26/2017  . LIGATION OF ARTERIOVENOUS  FISTULA Left 02/04/2015   Procedure: LIGATION OF BRACHIOCEPHALIC ARTERIOVENOUS  FISTULA;  Surgeon: Conrad Palominas, MD;  Location: [place];  Service: Vascular;  Laterality: Left;  Marland Kitchen MULTIPLE TOOTH EXTRACTIONS    . PARTIAL HIP ARTHROPLASTY    . POLYPECTOMY  08/21/2018   Procedure: POLYPECTOMY;  Surgeon: Thornton Park, MD;  Location: [place];   Service: Gastroenterology;;  . PORTACATH PLACEMENT    . REVERSE SHOULDER ARTHROPLASTY Left 01/22/2016   Procedure: LEFT REVERSE SHOULDER ARTHROPLASTY;  Surgeon: Netta Cedars, MD;  Location: [place];  Service: Orthopedics;  Laterality: Left;  . SHOULDER ARTHROSCOPY Bilateral   . SPLIT NIGHT STUDY  07/26/2015  . STERIOD INJECTION Right 01/22/2016   Procedure: RIGHT RING FINGER STEROID INJECTION;  Surgeon: Netta Cedars, MD;  Location: [place];  Service: Orthopedics;  Laterality: Right;  . TEE WITHOUT CARDIOVERSION N/A 05/26/2017   Procedure: TRANSESOPHAGEAL ECHOCARDIOGRAM (TEE);  Surgeon: Lelon Perla, MD;  Location: [place];  Service: Cardiovascular;  Laterality: N/A;  . THROMBECTOMY BRACHIAL ARTERY Left 02/06/2015   Procedure: EVACUATION OF LEFT ARM HEMATOMA;  Surgeon: Angelia Mould, MD;  Location: [place];  Service: Vascular;  Laterality: Left;  . TOTAL KNEE ARTHROPLASTY Bilateral   . TUBAL LIGATION       Current Outpatient Medications  Medication Sig Dispense Refill  . acetaminophen (TYLENOL) 500 MG tablet Take 2 tablets (1,000 mg total) by mouth every 8 (eight) hours as needed for mild pain or headache. 30 tablet 0  . albuterol (VENTOLIN HFA) 108 (90 Base) MCG/ACT inhaler Inhale 2 puffs into the lungs every 6 (six) hours as needed for wheezing or shortness of breath. 18 g 2  . alendronate (FOSAMAX) 70 MG tablet Take 1 tablet (70 mg total) by mouth every Thursday. 12 tablet 3  . AURYXIA 1 GM 210 MG(Fe) tablet Take 210 mg by mouth 3 (three) times daily with meals.    . budesonide-formoterol (SYMBICORT) 80-4.5 MCG/ACT inhaler Inhale 2 puffs into the lungs 2 (two) times daily. 3 each 4  . cinacalcet (SENSIPAR) 30 MG tablet Take 1 tablet (30 mg total) by mouth every Monday, Wednesday, and Friday. 60 tablet 0  . docusate calcium (SURFAK) 240 MG capsule Take 1 capsule (240 mg total) by mouth daily. 90 capsule 1  . doxercalciferol (HECTOROL) 4 MCG/2ML injection Inject 0.5 mLs (1 mcg  total) into the vein every Monday, Wednesday, and Friday with hemodialysis. 2 mL 0  . ezetimibe (ZETIA) 10 MG tablet Take 1 tablet (10 mg total) by mouth daily. 90 tablet 3  . famotidine (PEPCID) 20 MG tablet Take 1 tablet (20 mg total) by mouth 2 (two) times daily. 180 tablet 1  . hydrALAZINE (APRESOLINE) 10 MG tablet Take 1 tablet (10 mg total) by mouth in the morning and at bedtime. 60 tablet 11  . HYDROcodone-acetaminophen (NORCO/VICODIN) 5-325 MG tablet Take 1 tablet by mouth every 6 (six) hours as needed for moderate pain. 60 tablet 0  . levocetirizine (XYZAL) 5 MG tablet Take 1 tablet (5 mg total) by mouth every evening. 30 tablet 2  . levothyroxine (SYNTHROID) 112 MCG tablet Take 1 tablet (112 mcg total) by mouth daily. 90 tablet 1  . methocarbamol (  ROBAXIN) 500 MG tablet Take 1 tablet (500 mg total) by mouth every 8 (eight) hours as needed for muscle spasms. 60 tablet 2  . multivitamin (RENA-VIT) TABS tablet Take 1 tablet by mouth daily.    Marland Kitchen nystatin cream (MYCOSTATIN) Apply 1 application topically 2 (two) times daily as needed for dry skin. 30 g 2  . pantoprazole (PROTONIX) 20 MG tablet Take 1 tablet (20 mg total) by mouth daily. 90 tablet 1  . pramipexole (MIRAPEX) 0.125 MG tablet Take 1 tablet (0.125 mg total) by mouth at bedtime. 90 tablet 1  . Vitamin D, Ergocalciferol, (DRISDOL) 1.25 MG (50000 UNIT) CAPS capsule Take 1 capsule (50,000 Units total) by mouth every Monday. 12 capsule 3  . [START ON 09/17/2020] HYDROcodone-acetaminophen (NORCO/VICODIN) 5-325 MG tablet Take 1 tablet by mouth every 6 (six) hours as needed for moderate pain. 60 tablet 0  . [START ON 10/17/2020] HYDROcodone-acetaminophen (NORCO/VICODIN) 5-325 MG tablet Take 1 tablet by mouth every 6 (six) hours as needed for moderate pain. 60 tablet 0   No current facility-administered medications for this visit.    Allergies:   Amoxicillin, Elemental sulfur, Sulfur, Peg 3350-electrolytes, Sulfa drugs cross reactors, Miralax  [polyethylene glycol], Mixed grasses, Percocet [oxycodone-acetaminophen], and Sulfa antibiotics    ROS:  Please see the history of present illness.   Otherwise, review of systems are positive for none.   All other systems are reviewed and negative.    PHYSICAL EXAM: VS:  BP (!) 152/80   Pulse 80   Ht _0  (1.575 m)   Wt 135 lb (61.2 kg)   BMI 24.69 kg/m  , BMI Body mass index is 24.69 kg/m. GEN: Frail-appearing NECK:  No jugular venous distention at 90 degrees, waveform within normal limits, carotid upstroke brisk and symmetric, no bruits, no thyromegaly LYMPHATICS:  No cervical adenopathy LUNGS:  Clear to auscultation bilaterally BACK:  No CVA tenderness CHEST:  Unremarkable HEART:  S1 and S2 within normal limits, no S3, no clicks, no rubs, no murmurs, irregular ABD:  Positive bowel sounds normal in frequency in pitch, no bruits, no rebound, no guarding, unable to assess midline mass or bruit with the patient seated. EXT:  2 plus pulses throughout, moderate edema, no cyanosis no clubbing SKIN:  No rashes no nodules NEURO:  Cranial nerves II through XII grossly intact, motor grossly intact throughout PSYCH:  Cognitively intact, oriented to person place and time  EKG:  EKG is  ordered today. The ekg ordered today demonstrates atrial fibrillation rate 80, left axis deviation, intervals within normal limits, no acute ST-T wave changes.   Recent Labs: 08/18/2020: ALT 13; BUN 35; Creatinine, Ser 5.32; Hemoglobin 11.8; Platelets 305; Potassium 5.7; Sodium 145; TSH 4.470    Lipid Panel    Component Value Date/Time   CHOL 149 05/19/2020 1502   TRIG 66 05/19/2020 1502   HDL 77 05/19/2020 1502   CHOLHDL 1.9 05/19/2020 1502   CHOLHDL 3.3 05/25/2017 0421   VLDL 18 05/25/2017 0421   LDLCALC 59 05/19/2020 1502      Wt Readings from Last 3 Encounters:  09/09/20 135 lb (61.2 kg)  08/18/20 137 lb (62.1 kg)  08/04/20 136 lb (61.7 kg)      Other studies Reviewed: Additional  studies/ records that were reviewed today include: Hospital records. Review of the above records demonstrates:  Please see elsewhere in the note.     ASSESSMENT AND PLAN:  PAF:  With her falls, chronic blood loss and frailty we are not  pursuing anticoagulation.  She had good rate control.  No change in therapy.   Orthostatic Hypotension: This will be addressed as below.  HTN: Her blood pressure is consistently elevated but she has been very sensitive to medications.  However, reviewing these she has been on carvedilol and amlodipine with her longer acting obviously.  I am going to try hydralazine very low-dose 10 mg twice daily to see how she does.  If she has orthostasis we will have to stop.   Current medicines are reviewed at length with the patient today.  The patient does not have concerns regarding medicines.  The following changes have been made:   As above  Labs/ tests ordered today include:   None   Orders Placed This Encounter  Procedures  . EKG 12-Lead     Disposition:   FU with me in one year.   Signed, Minus Breeding, MD  09/09/2020 1:26 PM    [facility]

## 2020-09-09 ENCOUNTER — Encounter: Payer: Self-pay | Admitting: Cardiology

## 2020-09-09 ENCOUNTER — Ambulatory Visit (INDEPENDENT_AMBULATORY_CARE_PROVIDER_SITE_OTHER): Payer: Medicare Other | Admitting: Cardiology

## 2020-09-09 ENCOUNTER — Other Ambulatory Visit: Payer: Self-pay

## 2020-09-09 VITALS — BP 152/80 | HR 80 | Ht 62.0 in | Wt 135.0 lb

## 2020-09-09 DIAGNOSIS — Z4901 Encounter for fitting and adjustment of extracorporeal dialysis catheter: Secondary | ICD-10-CM | POA: Diagnosis not present

## 2020-09-09 DIAGNOSIS — Z992 Dependence on renal dialysis: Secondary | ICD-10-CM | POA: Diagnosis not present

## 2020-09-09 DIAGNOSIS — I480 Paroxysmal atrial fibrillation: Secondary | ICD-10-CM | POA: Diagnosis not present

## 2020-09-09 DIAGNOSIS — D689 Coagulation defect, unspecified: Secondary | ICD-10-CM | POA: Diagnosis not present

## 2020-09-09 DIAGNOSIS — D509 Iron deficiency anemia, unspecified: Secondary | ICD-10-CM | POA: Diagnosis not present

## 2020-09-09 DIAGNOSIS — I951 Orthostatic hypotension: Secondary | ICD-10-CM | POA: Diagnosis not present

## 2020-09-09 DIAGNOSIS — N2581 Secondary hyperparathyroidism of renal origin: Secondary | ICD-10-CM | POA: Diagnosis not present

## 2020-09-09 DIAGNOSIS — N186 End stage renal disease: Secondary | ICD-10-CM | POA: Diagnosis not present

## 2020-09-09 MED ORDER — HYDRALAZINE HCL 10 MG PO TABS: 10.0000 mg | ORAL_TABLET | Freq: Two times a day (BID) | ORAL | 11 refills | Status: DC

## 2020-09-09 NOTE — Patient Instructions (Signed)
Medication Instructions:  Please start Hydralazine 10 mg twice a day. Continue all other medications as listed.  *If you need a refill on your cardiac medications before your next appointment, please call your pharmacy*  Follow-Up: At [facility], you and your health needs are our priority.  As part of our continuing mission to provide you with exceptional heart care, we have created designated Provider Care Teams.  These Care Teams include your primary Cardiologist (physician) and Advanced Practice Providers (APPs -  Physician Assistants and Nurse Practitioners) who all work together to provide you with the care you need, when you need it.  We recommend signing up for the patient portal called "MyChart".  Sign up information is provided on this After Visit Summary.  MyChart is used to connect with patients for Virtual Visits (Telemedicine).  Patients are able to view lab/test results, encounter notes, upcoming appointments, etc.  Non-urgent messages can be sent to your provider as well.   To learn more about what you can do with MyChart, go to [email or web address].    Your next appointment:   12 month(s)  The format for your next appointment:   In Person  Provider:   Minus Breeding, MD   Thank you for choosing [facility]!!

## 2020-09-10 DIAGNOSIS — D62 Acute posthemorrhagic anemia: Secondary | ICD-10-CM | POA: Diagnosis not present

## 2020-09-10 DIAGNOSIS — K922 Gastrointestinal hemorrhage, unspecified: Secondary | ICD-10-CM | POA: Diagnosis not present

## 2020-09-10 DIAGNOSIS — J449 Chronic obstructive pulmonary disease, unspecified: Secondary | ICD-10-CM | POA: Diagnosis not present

## 2020-09-10 DIAGNOSIS — I120 Hypertensive chronic kidney disease with stage 5 chronic kidney disease or end stage renal disease: Secondary | ICD-10-CM | POA: Diagnosis not present

## 2020-09-10 DIAGNOSIS — N186 End stage renal disease: Secondary | ICD-10-CM | POA: Diagnosis not present

## 2020-09-11 DIAGNOSIS — Z992 Dependence on renal dialysis: Secondary | ICD-10-CM | POA: Diagnosis not present

## 2020-09-11 DIAGNOSIS — N2581 Secondary hyperparathyroidism of renal origin: Secondary | ICD-10-CM | POA: Diagnosis not present

## 2020-09-11 DIAGNOSIS — N186 End stage renal disease: Secondary | ICD-10-CM | POA: Diagnosis not present

## 2020-09-11 DIAGNOSIS — Z4901 Encounter for fitting and adjustment of extracorporeal dialysis catheter: Secondary | ICD-10-CM | POA: Diagnosis not present

## 2020-09-11 DIAGNOSIS — D689 Coagulation defect, unspecified: Secondary | ICD-10-CM | POA: Diagnosis not present

## 2020-09-11 DIAGNOSIS — D509 Iron deficiency anemia, unspecified: Secondary | ICD-10-CM | POA: Diagnosis not present

## 2020-09-13 DIAGNOSIS — Y929 Unspecified place or not applicable: Secondary | ICD-10-CM | POA: Diagnosis not present

## 2020-09-13 DIAGNOSIS — S43402A Unspecified sprain of left shoulder joint, initial encounter: Secondary | ICD-10-CM | POA: Diagnosis not present

## 2020-09-13 DIAGNOSIS — Y9389 Activity, other specified: Secondary | ICD-10-CM | POA: Diagnosis not present

## 2020-09-13 DIAGNOSIS — X509XXA Other and unspecified overexertion or strenuous movements or postures, initial encounter: Secondary | ICD-10-CM | POA: Diagnosis not present

## 2020-09-13 DIAGNOSIS — M25512 Pain in left shoulder: Secondary | ICD-10-CM | POA: Diagnosis not present

## 2020-09-14 DIAGNOSIS — Z4901 Encounter for fitting and adjustment of extracorporeal dialysis catheter: Secondary | ICD-10-CM | POA: Diagnosis not present

## 2020-09-14 DIAGNOSIS — N186 End stage renal disease: Secondary | ICD-10-CM | POA: Diagnosis not present

## 2020-09-14 DIAGNOSIS — Z992 Dependence on renal dialysis: Secondary | ICD-10-CM | POA: Diagnosis not present

## 2020-09-14 DIAGNOSIS — N2581 Secondary hyperparathyroidism of renal origin: Secondary | ICD-10-CM | POA: Diagnosis not present

## 2020-09-14 DIAGNOSIS — D689 Coagulation defect, unspecified: Secondary | ICD-10-CM | POA: Diagnosis not present

## 2020-09-15 DIAGNOSIS — K922 Gastrointestinal hemorrhage, unspecified: Secondary | ICD-10-CM | POA: Diagnosis not present

## 2020-09-15 DIAGNOSIS — D62 Acute posthemorrhagic anemia: Secondary | ICD-10-CM | POA: Diagnosis not present

## 2020-09-15 DIAGNOSIS — I120 Hypertensive chronic kidney disease with stage 5 chronic kidney disease or end stage renal disease: Secondary | ICD-10-CM | POA: Diagnosis not present

## 2020-09-15 DIAGNOSIS — J449 Chronic obstructive pulmonary disease, unspecified: Secondary | ICD-10-CM | POA: Diagnosis not present

## 2020-09-15 DIAGNOSIS — N186 End stage renal disease: Secondary | ICD-10-CM | POA: Diagnosis not present

## 2020-09-16 DIAGNOSIS — N2581 Secondary hyperparathyroidism of renal origin: Secondary | ICD-10-CM | POA: Diagnosis not present

## 2020-09-16 DIAGNOSIS — D689 Coagulation defect, unspecified: Secondary | ICD-10-CM | POA: Diagnosis not present

## 2020-09-16 DIAGNOSIS — Z992 Dependence on renal dialysis: Secondary | ICD-10-CM | POA: Diagnosis not present

## 2020-09-16 DIAGNOSIS — Z4901 Encounter for fitting and adjustment of extracorporeal dialysis catheter: Secondary | ICD-10-CM | POA: Diagnosis not present

## 2020-09-16 DIAGNOSIS — N186 End stage renal disease: Secondary | ICD-10-CM | POA: Diagnosis not present

## 2020-09-18 DIAGNOSIS — Z992 Dependence on renal dialysis: Secondary | ICD-10-CM | POA: Diagnosis not present

## 2020-09-18 DIAGNOSIS — N186 End stage renal disease: Secondary | ICD-10-CM | POA: Diagnosis not present

## 2020-09-18 DIAGNOSIS — Z4901 Encounter for fitting and adjustment of extracorporeal dialysis catheter: Secondary | ICD-10-CM | POA: Diagnosis not present

## 2020-09-18 DIAGNOSIS — D689 Coagulation defect, unspecified: Secondary | ICD-10-CM | POA: Diagnosis not present

## 2020-09-18 DIAGNOSIS — N2581 Secondary hyperparathyroidism of renal origin: Secondary | ICD-10-CM | POA: Diagnosis not present

## 2020-09-21 DIAGNOSIS — N2581 Secondary hyperparathyroidism of renal origin: Secondary | ICD-10-CM | POA: Diagnosis not present

## 2020-09-21 DIAGNOSIS — Z992 Dependence on renal dialysis: Secondary | ICD-10-CM | POA: Diagnosis not present

## 2020-09-21 DIAGNOSIS — Z4901 Encounter for fitting and adjustment of extracorporeal dialysis catheter: Secondary | ICD-10-CM | POA: Diagnosis not present

## 2020-09-21 DIAGNOSIS — D689 Coagulation defect, unspecified: Secondary | ICD-10-CM | POA: Diagnosis not present

## 2020-09-21 DIAGNOSIS — N186 End stage renal disease: Secondary | ICD-10-CM | POA: Diagnosis not present

## 2020-09-21 DIAGNOSIS — R519 Headache, unspecified: Secondary | ICD-10-CM | POA: Diagnosis not present

## 2020-09-23 DIAGNOSIS — R519 Headache, unspecified: Secondary | ICD-10-CM | POA: Diagnosis not present

## 2020-09-23 DIAGNOSIS — N2581 Secondary hyperparathyroidism of renal origin: Secondary | ICD-10-CM | POA: Diagnosis not present

## 2020-09-23 DIAGNOSIS — Z4901 Encounter for fitting and adjustment of extracorporeal dialysis catheter: Secondary | ICD-10-CM | POA: Diagnosis not present

## 2020-09-23 DIAGNOSIS — Z992 Dependence on renal dialysis: Secondary | ICD-10-CM | POA: Diagnosis not present

## 2020-09-23 DIAGNOSIS — D689 Coagulation defect, unspecified: Secondary | ICD-10-CM | POA: Diagnosis not present

## 2020-09-23 DIAGNOSIS — N186 End stage renal disease: Secondary | ICD-10-CM | POA: Diagnosis not present

## 2020-09-24 DIAGNOSIS — J9621 Acute and chronic respiratory failure with hypoxia: Secondary | ICD-10-CM | POA: Diagnosis not present

## 2020-09-24 DIAGNOSIS — I480 Paroxysmal atrial fibrillation: Secondary | ICD-10-CM | POA: Diagnosis not present

## 2020-09-24 DIAGNOSIS — M1990 Unspecified osteoarthritis, unspecified site: Secondary | ICD-10-CM | POA: Diagnosis not present

## 2020-09-24 DIAGNOSIS — N185 Chronic kidney disease, stage 5: Secondary | ICD-10-CM | POA: Diagnosis not present

## 2020-09-25 DIAGNOSIS — N2581 Secondary hyperparathyroidism of renal origin: Secondary | ICD-10-CM | POA: Diagnosis not present

## 2020-09-25 DIAGNOSIS — N186 End stage renal disease: Secondary | ICD-10-CM | POA: Diagnosis not present

## 2020-09-25 DIAGNOSIS — Z4901 Encounter for fitting and adjustment of extracorporeal dialysis catheter: Secondary | ICD-10-CM | POA: Diagnosis not present

## 2020-09-25 DIAGNOSIS — R519 Headache, unspecified: Secondary | ICD-10-CM | POA: Diagnosis not present

## 2020-09-25 DIAGNOSIS — Z992 Dependence on renal dialysis: Secondary | ICD-10-CM | POA: Diagnosis not present

## 2020-09-25 DIAGNOSIS — D689 Coagulation defect, unspecified: Secondary | ICD-10-CM | POA: Diagnosis not present

## 2020-09-27 DIAGNOSIS — I480 Paroxysmal atrial fibrillation: Secondary | ICD-10-CM | POA: Diagnosis not present

## 2020-09-28 DIAGNOSIS — Z992 Dependence on renal dialysis: Secondary | ICD-10-CM | POA: Diagnosis not present

## 2020-09-28 DIAGNOSIS — N186 End stage renal disease: Secondary | ICD-10-CM | POA: Diagnosis not present

## 2020-09-28 DIAGNOSIS — D689 Coagulation defect, unspecified: Secondary | ICD-10-CM | POA: Diagnosis not present

## 2020-09-28 DIAGNOSIS — R519 Headache, unspecified: Secondary | ICD-10-CM | POA: Diagnosis not present

## 2020-09-28 DIAGNOSIS — N2581 Secondary hyperparathyroidism of renal origin: Secondary | ICD-10-CM | POA: Diagnosis not present

## 2020-09-28 DIAGNOSIS — Z4901 Encounter for fitting and adjustment of extracorporeal dialysis catheter: Secondary | ICD-10-CM | POA: Diagnosis not present

## 2020-09-30 DIAGNOSIS — D689 Coagulation defect, unspecified: Secondary | ICD-10-CM | POA: Diagnosis not present

## 2020-09-30 DIAGNOSIS — Z4901 Encounter for fitting and adjustment of extracorporeal dialysis catheter: Secondary | ICD-10-CM | POA: Diagnosis not present

## 2020-09-30 DIAGNOSIS — R519 Headache, unspecified: Secondary | ICD-10-CM | POA: Diagnosis not present

## 2020-09-30 DIAGNOSIS — N186 End stage renal disease: Secondary | ICD-10-CM | POA: Diagnosis not present

## 2020-09-30 DIAGNOSIS — Z992 Dependence on renal dialysis: Secondary | ICD-10-CM | POA: Diagnosis not present

## 2020-09-30 DIAGNOSIS — N2581 Secondary hyperparathyroidism of renal origin: Secondary | ICD-10-CM | POA: Diagnosis not present

## 2020-10-01 DIAGNOSIS — E1122 Type 2 diabetes mellitus with diabetic chronic kidney disease: Secondary | ICD-10-CM | POA: Diagnosis not present

## 2020-10-01 DIAGNOSIS — Z992 Dependence on renal dialysis: Secondary | ICD-10-CM | POA: Diagnosis not present

## 2020-10-01 DIAGNOSIS — N186 End stage renal disease: Secondary | ICD-10-CM | POA: Diagnosis not present

## 2020-10-02 DIAGNOSIS — N186 End stage renal disease: Secondary | ICD-10-CM | POA: Diagnosis not present

## 2020-10-02 DIAGNOSIS — Z4901 Encounter for fitting and adjustment of extracorporeal dialysis catheter: Secondary | ICD-10-CM | POA: Diagnosis not present

## 2020-10-02 DIAGNOSIS — N2581 Secondary hyperparathyroidism of renal origin: Secondary | ICD-10-CM | POA: Diagnosis not present

## 2020-10-02 DIAGNOSIS — D631 Anemia in chronic kidney disease: Secondary | ICD-10-CM | POA: Diagnosis not present

## 2020-10-02 DIAGNOSIS — D689 Coagulation defect, unspecified: Secondary | ICD-10-CM | POA: Diagnosis not present

## 2020-10-02 DIAGNOSIS — R519 Headache, unspecified: Secondary | ICD-10-CM | POA: Diagnosis not present

## 2020-10-02 DIAGNOSIS — N185 Chronic kidney disease, stage 5: Secondary | ICD-10-CM | POA: Diagnosis not present

## 2020-10-02 DIAGNOSIS — E1121 Type 2 diabetes mellitus with diabetic nephropathy: Secondary | ICD-10-CM | POA: Diagnosis not present

## 2020-10-02 DIAGNOSIS — Z992 Dependence on renal dialysis: Secondary | ICD-10-CM | POA: Diagnosis not present

## 2020-10-05 DIAGNOSIS — D689 Coagulation defect, unspecified: Secondary | ICD-10-CM | POA: Diagnosis not present

## 2020-10-05 DIAGNOSIS — R519 Headache, unspecified: Secondary | ICD-10-CM | POA: Diagnosis not present

## 2020-10-05 DIAGNOSIS — E1121 Type 2 diabetes mellitus with diabetic nephropathy: Secondary | ICD-10-CM | POA: Diagnosis not present

## 2020-10-05 DIAGNOSIS — N185 Chronic kidney disease, stage 5: Secondary | ICD-10-CM | POA: Diagnosis not present

## 2020-10-05 DIAGNOSIS — Z992 Dependence on renal dialysis: Secondary | ICD-10-CM | POA: Diagnosis not present

## 2020-10-05 DIAGNOSIS — D631 Anemia in chronic kidney disease: Secondary | ICD-10-CM | POA: Diagnosis not present

## 2020-10-05 DIAGNOSIS — Z4901 Encounter for fitting and adjustment of extracorporeal dialysis catheter: Secondary | ICD-10-CM | POA: Diagnosis not present

## 2020-10-05 DIAGNOSIS — N186 End stage renal disease: Secondary | ICD-10-CM | POA: Diagnosis not present

## 2020-10-05 DIAGNOSIS — N2581 Secondary hyperparathyroidism of renal origin: Secondary | ICD-10-CM | POA: Diagnosis not present

## 2020-10-06 DIAGNOSIS — M25512 Pain in left shoulder: Secondary | ICD-10-CM | POA: Diagnosis not present

## 2020-10-06 DIAGNOSIS — M25562 Pain in left knee: Secondary | ICD-10-CM | POA: Diagnosis not present

## 2020-10-07 DIAGNOSIS — D631 Anemia in chronic kidney disease: Secondary | ICD-10-CM | POA: Diagnosis not present

## 2020-10-07 DIAGNOSIS — R519 Headache, unspecified: Secondary | ICD-10-CM | POA: Diagnosis not present

## 2020-10-07 DIAGNOSIS — N186 End stage renal disease: Secondary | ICD-10-CM | POA: Diagnosis not present

## 2020-10-07 DIAGNOSIS — N2581 Secondary hyperparathyroidism of renal origin: Secondary | ICD-10-CM | POA: Diagnosis not present

## 2020-10-07 DIAGNOSIS — N185 Chronic kidney disease, stage 5: Secondary | ICD-10-CM | POA: Diagnosis not present

## 2020-10-07 DIAGNOSIS — Z4901 Encounter for fitting and adjustment of extracorporeal dialysis catheter: Secondary | ICD-10-CM | POA: Diagnosis not present

## 2020-10-07 DIAGNOSIS — E1121 Type 2 diabetes mellitus with diabetic nephropathy: Secondary | ICD-10-CM | POA: Diagnosis not present

## 2020-10-07 DIAGNOSIS — Z992 Dependence on renal dialysis: Secondary | ICD-10-CM | POA: Diagnosis not present

## 2020-10-07 DIAGNOSIS — D689 Coagulation defect, unspecified: Secondary | ICD-10-CM | POA: Diagnosis not present

## 2020-10-09 DIAGNOSIS — N186 End stage renal disease: Secondary | ICD-10-CM | POA: Diagnosis not present

## 2020-10-09 DIAGNOSIS — N2581 Secondary hyperparathyroidism of renal origin: Secondary | ICD-10-CM | POA: Diagnosis not present

## 2020-10-09 DIAGNOSIS — E1121 Type 2 diabetes mellitus with diabetic nephropathy: Secondary | ICD-10-CM | POA: Diagnosis not present

## 2020-10-09 DIAGNOSIS — R519 Headache, unspecified: Secondary | ICD-10-CM | POA: Diagnosis not present

## 2020-10-09 DIAGNOSIS — Z4901 Encounter for fitting and adjustment of extracorporeal dialysis catheter: Secondary | ICD-10-CM | POA: Diagnosis not present

## 2020-10-09 DIAGNOSIS — Z992 Dependence on renal dialysis: Secondary | ICD-10-CM | POA: Diagnosis not present

## 2020-10-09 DIAGNOSIS — D689 Coagulation defect, unspecified: Secondary | ICD-10-CM | POA: Diagnosis not present

## 2020-10-09 DIAGNOSIS — D631 Anemia in chronic kidney disease: Secondary | ICD-10-CM | POA: Diagnosis not present

## 2020-10-09 DIAGNOSIS — N185 Chronic kidney disease, stage 5: Secondary | ICD-10-CM | POA: Diagnosis not present

## 2020-10-12 DIAGNOSIS — N2581 Secondary hyperparathyroidism of renal origin: Secondary | ICD-10-CM | POA: Diagnosis not present

## 2020-10-12 DIAGNOSIS — E1121 Type 2 diabetes mellitus with diabetic nephropathy: Secondary | ICD-10-CM | POA: Diagnosis not present

## 2020-10-12 DIAGNOSIS — Z992 Dependence on renal dialysis: Secondary | ICD-10-CM | POA: Diagnosis not present

## 2020-10-12 DIAGNOSIS — D631 Anemia in chronic kidney disease: Secondary | ICD-10-CM | POA: Diagnosis not present

## 2020-10-12 DIAGNOSIS — N186 End stage renal disease: Secondary | ICD-10-CM | POA: Diagnosis not present

## 2020-10-12 DIAGNOSIS — R519 Headache, unspecified: Secondary | ICD-10-CM | POA: Diagnosis not present

## 2020-10-12 DIAGNOSIS — N185 Chronic kidney disease, stage 5: Secondary | ICD-10-CM | POA: Diagnosis not present

## 2020-10-12 DIAGNOSIS — Z4901 Encounter for fitting and adjustment of extracorporeal dialysis catheter: Secondary | ICD-10-CM | POA: Diagnosis not present

## 2020-10-12 DIAGNOSIS — D689 Coagulation defect, unspecified: Secondary | ICD-10-CM | POA: Diagnosis not present

## 2020-10-13 DIAGNOSIS — Z992 Dependence on renal dialysis: Secondary | ICD-10-CM | POA: Diagnosis not present

## 2020-10-13 DIAGNOSIS — N185 Chronic kidney disease, stage 5: Secondary | ICD-10-CM | POA: Diagnosis not present

## 2020-10-13 DIAGNOSIS — R519 Headache, unspecified: Secondary | ICD-10-CM | POA: Diagnosis not present

## 2020-10-13 DIAGNOSIS — D689 Coagulation defect, unspecified: Secondary | ICD-10-CM | POA: Diagnosis not present

## 2020-10-13 DIAGNOSIS — N186 End stage renal disease: Secondary | ICD-10-CM | POA: Diagnosis not present

## 2020-10-13 DIAGNOSIS — D631 Anemia in chronic kidney disease: Secondary | ICD-10-CM | POA: Diagnosis not present

## 2020-10-13 DIAGNOSIS — Z4901 Encounter for fitting and adjustment of extracorporeal dialysis catheter: Secondary | ICD-10-CM | POA: Diagnosis not present

## 2020-10-13 DIAGNOSIS — N2581 Secondary hyperparathyroidism of renal origin: Secondary | ICD-10-CM | POA: Diagnosis not present

## 2020-10-13 DIAGNOSIS — E1121 Type 2 diabetes mellitus with diabetic nephropathy: Secondary | ICD-10-CM | POA: Diagnosis not present

## 2020-10-14 DIAGNOSIS — N2581 Secondary hyperparathyroidism of renal origin: Secondary | ICD-10-CM | POA: Diagnosis not present

## 2020-10-14 DIAGNOSIS — D631 Anemia in chronic kidney disease: Secondary | ICD-10-CM | POA: Diagnosis not present

## 2020-10-14 DIAGNOSIS — N186 End stage renal disease: Secondary | ICD-10-CM | POA: Diagnosis not present

## 2020-10-14 DIAGNOSIS — R519 Headache, unspecified: Secondary | ICD-10-CM | POA: Diagnosis not present

## 2020-10-14 DIAGNOSIS — Z4901 Encounter for fitting and adjustment of extracorporeal dialysis catheter: Secondary | ICD-10-CM | POA: Diagnosis not present

## 2020-10-14 DIAGNOSIS — D689 Coagulation defect, unspecified: Secondary | ICD-10-CM | POA: Diagnosis not present

## 2020-10-14 DIAGNOSIS — E1121 Type 2 diabetes mellitus with diabetic nephropathy: Secondary | ICD-10-CM | POA: Diagnosis not present

## 2020-10-14 DIAGNOSIS — Z992 Dependence on renal dialysis: Secondary | ICD-10-CM | POA: Diagnosis not present

## 2020-10-14 DIAGNOSIS — N185 Chronic kidney disease, stage 5: Secondary | ICD-10-CM | POA: Diagnosis not present

## 2020-10-16 DIAGNOSIS — R519 Headache, unspecified: Secondary | ICD-10-CM | POA: Diagnosis not present

## 2020-10-16 DIAGNOSIS — Z4901 Encounter for fitting and adjustment of extracorporeal dialysis catheter: Secondary | ICD-10-CM | POA: Diagnosis not present

## 2020-10-16 DIAGNOSIS — N186 End stage renal disease: Secondary | ICD-10-CM | POA: Diagnosis not present

## 2020-10-16 DIAGNOSIS — N185 Chronic kidney disease, stage 5: Secondary | ICD-10-CM | POA: Diagnosis not present

## 2020-10-16 DIAGNOSIS — Z992 Dependence on renal dialysis: Secondary | ICD-10-CM | POA: Diagnosis not present

## 2020-10-16 DIAGNOSIS — N2581 Secondary hyperparathyroidism of renal origin: Secondary | ICD-10-CM | POA: Diagnosis not present

## 2020-10-16 DIAGNOSIS — D631 Anemia in chronic kidney disease: Secondary | ICD-10-CM | POA: Diagnosis not present

## 2020-10-16 DIAGNOSIS — D689 Coagulation defect, unspecified: Secondary | ICD-10-CM | POA: Diagnosis not present

## 2020-10-16 DIAGNOSIS — E1121 Type 2 diabetes mellitus with diabetic nephropathy: Secondary | ICD-10-CM | POA: Diagnosis not present

## 2020-10-19 DIAGNOSIS — N186 End stage renal disease: Secondary | ICD-10-CM | POA: Diagnosis not present

## 2020-10-19 DIAGNOSIS — E1121 Type 2 diabetes mellitus with diabetic nephropathy: Secondary | ICD-10-CM | POA: Diagnosis not present

## 2020-10-19 DIAGNOSIS — D689 Coagulation defect, unspecified: Secondary | ICD-10-CM | POA: Diagnosis not present

## 2020-10-19 DIAGNOSIS — Z4901 Encounter for fitting and adjustment of extracorporeal dialysis catheter: Secondary | ICD-10-CM | POA: Diagnosis not present

## 2020-10-19 DIAGNOSIS — D631 Anemia in chronic kidney disease: Secondary | ICD-10-CM | POA: Diagnosis not present

## 2020-10-19 DIAGNOSIS — N185 Chronic kidney disease, stage 5: Secondary | ICD-10-CM | POA: Diagnosis not present

## 2020-10-19 DIAGNOSIS — R519 Headache, unspecified: Secondary | ICD-10-CM | POA: Diagnosis not present

## 2020-10-19 DIAGNOSIS — Z992 Dependence on renal dialysis: Secondary | ICD-10-CM | POA: Diagnosis not present

## 2020-10-19 DIAGNOSIS — N2581 Secondary hyperparathyroidism of renal origin: Secondary | ICD-10-CM | POA: Diagnosis not present

## 2020-10-21 DIAGNOSIS — E1121 Type 2 diabetes mellitus with diabetic nephropathy: Secondary | ICD-10-CM | POA: Diagnosis not present

## 2020-10-21 DIAGNOSIS — D689 Coagulation defect, unspecified: Secondary | ICD-10-CM | POA: Diagnosis not present

## 2020-10-21 DIAGNOSIS — R519 Headache, unspecified: Secondary | ICD-10-CM | POA: Diagnosis not present

## 2020-10-21 DIAGNOSIS — Z4901 Encounter for fitting and adjustment of extracorporeal dialysis catheter: Secondary | ICD-10-CM | POA: Diagnosis not present

## 2020-10-21 DIAGNOSIS — N2581 Secondary hyperparathyroidism of renal origin: Secondary | ICD-10-CM | POA: Diagnosis not present

## 2020-10-21 DIAGNOSIS — N185 Chronic kidney disease, stage 5: Secondary | ICD-10-CM | POA: Diagnosis not present

## 2020-10-21 DIAGNOSIS — Z992 Dependence on renal dialysis: Secondary | ICD-10-CM | POA: Diagnosis not present

## 2020-10-21 DIAGNOSIS — N186 End stage renal disease: Secondary | ICD-10-CM | POA: Diagnosis not present

## 2020-10-21 DIAGNOSIS — D631 Anemia in chronic kidney disease: Secondary | ICD-10-CM | POA: Diagnosis not present

## 2020-10-23 DIAGNOSIS — D689 Coagulation defect, unspecified: Secondary | ICD-10-CM | POA: Diagnosis not present

## 2020-10-23 DIAGNOSIS — R519 Headache, unspecified: Secondary | ICD-10-CM | POA: Diagnosis not present

## 2020-10-23 DIAGNOSIS — Z992 Dependence on renal dialysis: Secondary | ICD-10-CM | POA: Diagnosis not present

## 2020-10-23 DIAGNOSIS — E1121 Type 2 diabetes mellitus with diabetic nephropathy: Secondary | ICD-10-CM | POA: Diagnosis not present

## 2020-10-23 DIAGNOSIS — N185 Chronic kidney disease, stage 5: Secondary | ICD-10-CM | POA: Diagnosis not present

## 2020-10-23 DIAGNOSIS — N186 End stage renal disease: Secondary | ICD-10-CM | POA: Diagnosis not present

## 2020-10-23 DIAGNOSIS — Z4901 Encounter for fitting and adjustment of extracorporeal dialysis catheter: Secondary | ICD-10-CM | POA: Diagnosis not present

## 2020-10-23 DIAGNOSIS — N2581 Secondary hyperparathyroidism of renal origin: Secondary | ICD-10-CM | POA: Diagnosis not present

## 2020-10-23 DIAGNOSIS — D631 Anemia in chronic kidney disease: Secondary | ICD-10-CM | POA: Diagnosis not present

## 2020-10-24 DIAGNOSIS — J9621 Acute and chronic respiratory failure with hypoxia: Secondary | ICD-10-CM | POA: Diagnosis not present

## 2020-10-24 DIAGNOSIS — M1990 Unspecified osteoarthritis, unspecified site: Secondary | ICD-10-CM | POA: Diagnosis not present

## 2020-10-24 DIAGNOSIS — I480 Paroxysmal atrial fibrillation: Secondary | ICD-10-CM | POA: Diagnosis not present

## 2020-10-24 DIAGNOSIS — N185 Chronic kidney disease, stage 5: Secondary | ICD-10-CM | POA: Diagnosis not present

## 2020-10-26 DIAGNOSIS — Z4901 Encounter for fitting and adjustment of extracorporeal dialysis catheter: Secondary | ICD-10-CM | POA: Diagnosis not present

## 2020-10-26 DIAGNOSIS — R519 Headache, unspecified: Secondary | ICD-10-CM | POA: Diagnosis not present

## 2020-10-26 DIAGNOSIS — D631 Anemia in chronic kidney disease: Secondary | ICD-10-CM | POA: Diagnosis not present

## 2020-10-26 DIAGNOSIS — N185 Chronic kidney disease, stage 5: Secondary | ICD-10-CM | POA: Diagnosis not present

## 2020-10-26 DIAGNOSIS — Z992 Dependence on renal dialysis: Secondary | ICD-10-CM | POA: Diagnosis not present

## 2020-10-26 DIAGNOSIS — N2581 Secondary hyperparathyroidism of renal origin: Secondary | ICD-10-CM | POA: Diagnosis not present

## 2020-10-26 DIAGNOSIS — D689 Coagulation defect, unspecified: Secondary | ICD-10-CM | POA: Diagnosis not present

## 2020-10-26 DIAGNOSIS — E1121 Type 2 diabetes mellitus with diabetic nephropathy: Secondary | ICD-10-CM | POA: Diagnosis not present

## 2020-10-26 DIAGNOSIS — N186 End stage renal disease: Secondary | ICD-10-CM | POA: Diagnosis not present

## 2020-10-27 DIAGNOSIS — I480 Paroxysmal atrial fibrillation: Secondary | ICD-10-CM | POA: Diagnosis not present

## 2020-10-28 DIAGNOSIS — E1121 Type 2 diabetes mellitus with diabetic nephropathy: Secondary | ICD-10-CM | POA: Diagnosis not present

## 2020-10-28 DIAGNOSIS — N2581 Secondary hyperparathyroidism of renal origin: Secondary | ICD-10-CM | POA: Diagnosis not present

## 2020-10-28 DIAGNOSIS — N185 Chronic kidney disease, stage 5: Secondary | ICD-10-CM | POA: Diagnosis not present

## 2020-10-28 DIAGNOSIS — Z992 Dependence on renal dialysis: Secondary | ICD-10-CM | POA: Diagnosis not present

## 2020-10-28 DIAGNOSIS — N186 End stage renal disease: Secondary | ICD-10-CM | POA: Diagnosis not present

## 2020-10-28 DIAGNOSIS — Z4901 Encounter for fitting and adjustment of extracorporeal dialysis catheter: Secondary | ICD-10-CM | POA: Diagnosis not present

## 2020-10-28 DIAGNOSIS — D689 Coagulation defect, unspecified: Secondary | ICD-10-CM | POA: Diagnosis not present

## 2020-10-28 DIAGNOSIS — R519 Headache, unspecified: Secondary | ICD-10-CM | POA: Diagnosis not present

## 2020-10-28 DIAGNOSIS — D631 Anemia in chronic kidney disease: Secondary | ICD-10-CM | POA: Diagnosis not present

## 2020-10-30 DIAGNOSIS — D689 Coagulation defect, unspecified: Secondary | ICD-10-CM | POA: Diagnosis not present

## 2020-10-30 DIAGNOSIS — N185 Chronic kidney disease, stage 5: Secondary | ICD-10-CM | POA: Diagnosis not present

## 2020-10-30 DIAGNOSIS — D631 Anemia in chronic kidney disease: Secondary | ICD-10-CM | POA: Diagnosis not present

## 2020-10-30 DIAGNOSIS — R519 Headache, unspecified: Secondary | ICD-10-CM | POA: Diagnosis not present

## 2020-10-30 DIAGNOSIS — N2581 Secondary hyperparathyroidism of renal origin: Secondary | ICD-10-CM | POA: Diagnosis not present

## 2020-10-30 DIAGNOSIS — Z992 Dependence on renal dialysis: Secondary | ICD-10-CM | POA: Diagnosis not present

## 2020-10-30 DIAGNOSIS — E1121 Type 2 diabetes mellitus with diabetic nephropathy: Secondary | ICD-10-CM | POA: Diagnosis not present

## 2020-10-30 DIAGNOSIS — M21372 Foot drop, left foot: Secondary | ICD-10-CM | POA: Diagnosis not present

## 2020-10-30 DIAGNOSIS — Z4901 Encounter for fitting and adjustment of extracorporeal dialysis catheter: Secondary | ICD-10-CM | POA: Diagnosis not present

## 2020-10-30 DIAGNOSIS — N186 End stage renal disease: Secondary | ICD-10-CM | POA: Diagnosis not present

## 2020-11-01 DIAGNOSIS — Z992 Dependence on renal dialysis: Secondary | ICD-10-CM | POA: Diagnosis not present

## 2020-11-01 DIAGNOSIS — N186 End stage renal disease: Secondary | ICD-10-CM | POA: Diagnosis not present

## 2020-11-01 DIAGNOSIS — E1122 Type 2 diabetes mellitus with diabetic chronic kidney disease: Secondary | ICD-10-CM | POA: Diagnosis not present

## 2020-11-02 DIAGNOSIS — Z4901 Encounter for fitting and adjustment of extracorporeal dialysis catheter: Secondary | ICD-10-CM | POA: Diagnosis not present

## 2020-11-02 DIAGNOSIS — N2581 Secondary hyperparathyroidism of renal origin: Secondary | ICD-10-CM | POA: Diagnosis not present

## 2020-11-02 DIAGNOSIS — Z992 Dependence on renal dialysis: Secondary | ICD-10-CM | POA: Diagnosis not present

## 2020-11-02 DIAGNOSIS — D689 Coagulation defect, unspecified: Secondary | ICD-10-CM | POA: Diagnosis not present

## 2020-11-02 DIAGNOSIS — N186 End stage renal disease: Secondary | ICD-10-CM | POA: Diagnosis not present

## 2020-11-02 DIAGNOSIS — D631 Anemia in chronic kidney disease: Secondary | ICD-10-CM | POA: Diagnosis not present

## 2020-11-02 DIAGNOSIS — R519 Headache, unspecified: Secondary | ICD-10-CM | POA: Diagnosis not present

## 2020-11-04 DIAGNOSIS — R519 Headache, unspecified: Secondary | ICD-10-CM | POA: Diagnosis not present

## 2020-11-04 DIAGNOSIS — N186 End stage renal disease: Secondary | ICD-10-CM | POA: Diagnosis not present

## 2020-11-04 DIAGNOSIS — N2581 Secondary hyperparathyroidism of renal origin: Secondary | ICD-10-CM | POA: Diagnosis not present

## 2020-11-04 DIAGNOSIS — Z4901 Encounter for fitting and adjustment of extracorporeal dialysis catheter: Secondary | ICD-10-CM | POA: Diagnosis not present

## 2020-11-04 DIAGNOSIS — D631 Anemia in chronic kidney disease: Secondary | ICD-10-CM | POA: Diagnosis not present

## 2020-11-04 DIAGNOSIS — Z992 Dependence on renal dialysis: Secondary | ICD-10-CM | POA: Diagnosis not present

## 2020-11-04 DIAGNOSIS — D689 Coagulation defect, unspecified: Secondary | ICD-10-CM | POA: Diagnosis not present

## 2020-11-05 DIAGNOSIS — D689 Coagulation defect, unspecified: Secondary | ICD-10-CM | POA: Diagnosis not present

## 2020-11-05 DIAGNOSIS — D631 Anemia in chronic kidney disease: Secondary | ICD-10-CM | POA: Diagnosis not present

## 2020-11-05 DIAGNOSIS — N2581 Secondary hyperparathyroidism of renal origin: Secondary | ICD-10-CM | POA: Diagnosis not present

## 2020-11-05 DIAGNOSIS — Z992 Dependence on renal dialysis: Secondary | ICD-10-CM | POA: Diagnosis not present

## 2020-11-05 DIAGNOSIS — R519 Headache, unspecified: Secondary | ICD-10-CM | POA: Diagnosis not present

## 2020-11-05 DIAGNOSIS — Z4901 Encounter for fitting and adjustment of extracorporeal dialysis catheter: Secondary | ICD-10-CM | POA: Diagnosis not present

## 2020-11-05 DIAGNOSIS — N186 End stage renal disease: Secondary | ICD-10-CM | POA: Diagnosis not present

## 2020-11-06 DIAGNOSIS — N186 End stage renal disease: Secondary | ICD-10-CM | POA: Diagnosis not present

## 2020-11-06 DIAGNOSIS — N2581 Secondary hyperparathyroidism of renal origin: Secondary | ICD-10-CM | POA: Diagnosis not present

## 2020-11-06 DIAGNOSIS — D689 Coagulation defect, unspecified: Secondary | ICD-10-CM | POA: Diagnosis not present

## 2020-11-06 DIAGNOSIS — Z992 Dependence on renal dialysis: Secondary | ICD-10-CM | POA: Diagnosis not present

## 2020-11-06 DIAGNOSIS — Z4901 Encounter for fitting and adjustment of extracorporeal dialysis catheter: Secondary | ICD-10-CM | POA: Diagnosis not present

## 2020-11-06 DIAGNOSIS — R519 Headache, unspecified: Secondary | ICD-10-CM | POA: Diagnosis not present

## 2020-11-06 DIAGNOSIS — D631 Anemia in chronic kidney disease: Secondary | ICD-10-CM | POA: Diagnosis not present

## 2020-11-09 DIAGNOSIS — D631 Anemia in chronic kidney disease: Secondary | ICD-10-CM | POA: Diagnosis not present

## 2020-11-09 DIAGNOSIS — Z4901 Encounter for fitting and adjustment of extracorporeal dialysis catheter: Secondary | ICD-10-CM | POA: Diagnosis not present

## 2020-11-09 DIAGNOSIS — Z992 Dependence on renal dialysis: Secondary | ICD-10-CM | POA: Diagnosis not present

## 2020-11-09 DIAGNOSIS — N186 End stage renal disease: Secondary | ICD-10-CM | POA: Diagnosis not present

## 2020-11-09 DIAGNOSIS — D689 Coagulation defect, unspecified: Secondary | ICD-10-CM | POA: Diagnosis not present

## 2020-11-09 DIAGNOSIS — N2581 Secondary hyperparathyroidism of renal origin: Secondary | ICD-10-CM | POA: Diagnosis not present

## 2020-11-09 DIAGNOSIS — R519 Headache, unspecified: Secondary | ICD-10-CM | POA: Diagnosis not present

## 2020-11-11 DIAGNOSIS — D631 Anemia in chronic kidney disease: Secondary | ICD-10-CM | POA: Diagnosis not present

## 2020-11-11 DIAGNOSIS — D689 Coagulation defect, unspecified: Secondary | ICD-10-CM | POA: Diagnosis not present

## 2020-11-11 DIAGNOSIS — Z4901 Encounter for fitting and adjustment of extracorporeal dialysis catheter: Secondary | ICD-10-CM | POA: Diagnosis not present

## 2020-11-11 DIAGNOSIS — N2581 Secondary hyperparathyroidism of renal origin: Secondary | ICD-10-CM | POA: Diagnosis not present

## 2020-11-11 DIAGNOSIS — R519 Headache, unspecified: Secondary | ICD-10-CM | POA: Diagnosis not present

## 2020-11-11 DIAGNOSIS — Z992 Dependence on renal dialysis: Secondary | ICD-10-CM | POA: Diagnosis not present

## 2020-11-11 DIAGNOSIS — N186 End stage renal disease: Secondary | ICD-10-CM | POA: Diagnosis not present

## 2020-11-12 DIAGNOSIS — M25552 Pain in left hip: Secondary | ICD-10-CM | POA: Diagnosis not present

## 2020-11-12 DIAGNOSIS — M25522 Pain in left elbow: Secondary | ICD-10-CM | POA: Diagnosis not present

## 2020-11-13 DIAGNOSIS — R519 Headache, unspecified: Secondary | ICD-10-CM | POA: Diagnosis not present

## 2020-11-13 DIAGNOSIS — D689 Coagulation defect, unspecified: Secondary | ICD-10-CM | POA: Diagnosis not present

## 2020-11-13 DIAGNOSIS — D631 Anemia in chronic kidney disease: Secondary | ICD-10-CM | POA: Diagnosis not present

## 2020-11-13 DIAGNOSIS — Z992 Dependence on renal dialysis: Secondary | ICD-10-CM | POA: Diagnosis not present

## 2020-11-13 DIAGNOSIS — N186 End stage renal disease: Secondary | ICD-10-CM | POA: Diagnosis not present

## 2020-11-13 DIAGNOSIS — Z4901 Encounter for fitting and adjustment of extracorporeal dialysis catheter: Secondary | ICD-10-CM | POA: Diagnosis not present

## 2020-11-13 DIAGNOSIS — N2581 Secondary hyperparathyroidism of renal origin: Secondary | ICD-10-CM | POA: Diagnosis not present

## 2020-11-16 DIAGNOSIS — R519 Headache, unspecified: Secondary | ICD-10-CM | POA: Diagnosis not present

## 2020-11-16 DIAGNOSIS — N186 End stage renal disease: Secondary | ICD-10-CM | POA: Diagnosis not present

## 2020-11-16 DIAGNOSIS — Z4901 Encounter for fitting and adjustment of extracorporeal dialysis catheter: Secondary | ICD-10-CM | POA: Diagnosis not present

## 2020-11-16 DIAGNOSIS — N2581 Secondary hyperparathyroidism of renal origin: Secondary | ICD-10-CM | POA: Diagnosis not present

## 2020-11-16 DIAGNOSIS — Z992 Dependence on renal dialysis: Secondary | ICD-10-CM | POA: Diagnosis not present

## 2020-11-16 DIAGNOSIS — D689 Coagulation defect, unspecified: Secondary | ICD-10-CM | POA: Diagnosis not present

## 2020-11-16 DIAGNOSIS — D631 Anemia in chronic kidney disease: Secondary | ICD-10-CM | POA: Diagnosis not present

## 2020-11-18 DIAGNOSIS — D689 Coagulation defect, unspecified: Secondary | ICD-10-CM | POA: Diagnosis not present

## 2020-11-18 DIAGNOSIS — R519 Headache, unspecified: Secondary | ICD-10-CM | POA: Diagnosis not present

## 2020-11-18 DIAGNOSIS — Z992 Dependence on renal dialysis: Secondary | ICD-10-CM | POA: Diagnosis not present

## 2020-11-18 DIAGNOSIS — N2581 Secondary hyperparathyroidism of renal origin: Secondary | ICD-10-CM | POA: Diagnosis not present

## 2020-11-18 DIAGNOSIS — N186 End stage renal disease: Secondary | ICD-10-CM | POA: Diagnosis not present

## 2020-11-18 DIAGNOSIS — D631 Anemia in chronic kidney disease: Secondary | ICD-10-CM | POA: Diagnosis not present

## 2020-11-18 DIAGNOSIS — Z4901 Encounter for fitting and adjustment of extracorporeal dialysis catheter: Secondary | ICD-10-CM | POA: Diagnosis not present

## 2020-11-19 ENCOUNTER — Ambulatory Visit (INDEPENDENT_AMBULATORY_CARE_PROVIDER_SITE_OTHER): Payer: Medicare Other | Admitting: Family Medicine

## 2020-11-19 ENCOUNTER — Other Ambulatory Visit: Payer: Self-pay

## 2020-11-19 ENCOUNTER — Encounter: Payer: Self-pay | Admitting: Family Medicine

## 2020-11-19 VITALS — BP 164/94 | HR 79 | Temp 98.1°F | Ht 62.0 in | Wt 129.8 lb

## 2020-11-19 DIAGNOSIS — Z79899 Other long term (current) drug therapy: Secondary | ICD-10-CM | POA: Diagnosis not present

## 2020-11-19 DIAGNOSIS — G2581 Restless legs syndrome: Secondary | ICD-10-CM | POA: Diagnosis not present

## 2020-11-19 DIAGNOSIS — L84 Corns and callosities: Secondary | ICD-10-CM | POA: Diagnosis not present

## 2020-11-19 DIAGNOSIS — G8929 Other chronic pain: Secondary | ICD-10-CM | POA: Diagnosis not present

## 2020-11-19 DIAGNOSIS — I10 Essential (primary) hypertension: Secondary | ICD-10-CM

## 2020-11-19 DIAGNOSIS — I70203 Unspecified atherosclerosis of native arteries of extremities, bilateral legs: Secondary | ICD-10-CM | POA: Diagnosis not present

## 2020-11-19 DIAGNOSIS — M546 Pain in thoracic spine: Secondary | ICD-10-CM | POA: Diagnosis not present

## 2020-11-19 DIAGNOSIS — B351 Tinea unguium: Secondary | ICD-10-CM | POA: Diagnosis not present

## 2020-11-19 DIAGNOSIS — K219 Gastro-esophageal reflux disease without esophagitis: Secondary | ICD-10-CM | POA: Diagnosis not present

## 2020-11-19 DIAGNOSIS — F419 Anxiety disorder, unspecified: Secondary | ICD-10-CM

## 2020-11-19 DIAGNOSIS — M1990 Unspecified osteoarthritis, unspecified site: Secondary | ICD-10-CM | POA: Diagnosis not present

## 2020-11-19 DIAGNOSIS — M79676 Pain in unspecified toe(s): Secondary | ICD-10-CM | POA: Diagnosis not present

## 2020-11-19 DIAGNOSIS — F331 Major depressive disorder, recurrent, moderate: Secondary | ICD-10-CM

## 2020-11-19 MED ORDER — PANTOPRAZOLE SODIUM 20 MG PO TBEC: 20.0000 mg | DELAYED_RELEASE_TABLET | Freq: Every day | ORAL | 1 refills | Status: DC

## 2020-11-19 MED ORDER — DOCUSATE CALCIUM 240 MG PO CAPS: 240.0000 mg | ORAL_CAPSULE | Freq: Every day | ORAL | 1 refills | Status: DC

## 2020-11-19 MED ORDER — PRAMIPEXOLE DIHYDROCHLORIDE 0.125 MG PO TABS: 0.1250 mg | ORAL_TABLET | Freq: Every day | ORAL | 1 refills | Status: DC

## 2020-11-19 MED ORDER — HYDROCODONE-ACETAMINOPHEN 5-325 MG PO TABS: 1.0000 | ORAL_TABLET | Freq: Four times a day (QID) | ORAL | 0 refills | Status: DC | PRN

## 2020-11-19 MED ORDER — FAMOTIDINE 20 MG PO TABS: 20.0000 mg | ORAL_TABLET | Freq: Two times a day (BID) | ORAL | 1 refills | Status: DC

## 2020-11-19 MED ORDER — CITALOPRAM HYDROBROMIDE 20 MG PO TABS: 20.0000 mg | ORAL_TABLET | Freq: Every day | ORAL | 2 refills | Status: DC

## 2020-11-19 NOTE — Patient Instructions (Signed)
Reminder: please return after mid-September for your flu shot. If you receive this elsewhere, such as your pharmacy, please let us know so we can get this documented in your chart.   

## 2020-11-19 NOTE — Progress Notes (Signed)
Assessment & Plan:  1. Arthritis - poorly controlled, she does not take often due to fear of addiction. Advised to take medications when she is in pain. - HYDROcodone-acetaminophen (NORCO/VICODIN) 5-325 MG tablet; Take 1 tablet by mouth every 6 (six) hours as needed for moderate pain.  Dispense: 60 tablet; Refill: 0 - HYDROcodone-acetaminophen (NORCO/VICODIN) 5-325 MG tablet; Take 1 tablet by mouth every 6 (six) hours as needed for moderate pain.  Dispense: 60 tablet; Refill: 0 - HYDROcodone-acetaminophen (NORCO/VICODIN) 5-325 MG tablet; Take 1 tablet by mouth every 6 (six) hours as needed for moderate pain.  Dispense: 60 tablet; Refill: 0  2. Chronic midline thoracic back pain - poorly controlled, she does not take often due to fear of addiction. Advised to take medications when she is in pain. - HYDROcodone-acetaminophen (NORCO/VICODIN) 5-325 MG tablet; Take 1 tablet by mouth every 6 (six) hours as needed for moderate pain.  Dispense: 60 tablet; Refill: 0 - HYDROcodone-acetaminophen (NORCO/VICODIN) 5-325 MG tablet; Take 1 tablet by mouth every 6 (six) hours as needed for moderate pain.  Dispense: 60 tablet; Refill: 0 - HYDROcodone-acetaminophen (NORCO/VICODIN) 5-325 MG tablet; Take 1 tablet by mouth every 6 (six) hours as needed for moderate pain.  Dispense: 60 tablet; Refill: 0  3. Controlled substance agreement signed - HYDROcodone-acetaminophen (NORCO/VICODIN) 5-325 MG tablet; Take 1 tablet by mouth every 6 (six) hours as needed for moderate pain.  Dispense: 60 tablet; Refill: 0 - HYDROcodone-acetaminophen (NORCO/VICODIN) 5-325 MG tablet; Take 1 tablet by mouth every 6 (six) hours as needed for moderate pain.  Dispense: 60 tablet; Refill: 0 - HYDROcodone-acetaminophen (NORCO/VICODIN) 5-325 MG tablet; Take 1 tablet by mouth every 6 (six) hours as needed for moderate pain.  Dispense: 60 tablet; Refill: 0  4. Primary hypertension - persistent readings 160s-170s at dialysis.  - Dr. Percival Spanish  with cardiology added 106m hydralazine on 09/09/20, she is unsure if she is taking it.  - advised to check medications at home to make sure she is taking antihypertensives as prescribed, and if not, she needs to start. If so, she needs to follow up with me or cardiology for a dose adjustment.  5. Gastroesophageal reflux disease without esophagitis -Well controlled on current regimen - famotidine (PEPCID) 20 MG tablet; Take 1 tablet (20 mg total) by mouth 2 (two) times daily.  Dispense: 180 tablet; Refill: 1 - pantoprazole (PROTONIX) 20 MG tablet; Take 1 tablet (20 mg total) by mouth daily.  Dispense: 90 tablet; Refill: 1  6. Restless leg syndrome - Well controlled on current regimen - pramipexole (MIRAPEX) 0.125 MG tablet; Take 1 tablet (0.125 mg total) by mouth at bedtime.  Dispense: 90 tablet; Refill: 1  7. Moderate episode of recurrent major depressive disorder ([place] - restarted Celexa, unclear why it was stopped but she has been off for 1 year  8. Anxiety - restarted Celexa, unclear why it was stopped but she has been off for 1 year   Return in about 3 months (around 02/19/2021) for annual physical.  ALucile Crater NP Student  Subjective:    Patient ID: [name] female    DOB: [birth year] 80y.o.   MRN: [id number] Patient Care Team: JLoman Brooklyn FNP as PCP - General (Family Medicine) HMinus Breeding MD as Consulting Physician (Cardiology) WTanda Rockers MD as Consulting Physician (Pulmonary Disease) DJamal Maes MD as Consulting Physician (Nephrology)   Chief Complaint:  Chief Complaint  Patient presents with   Hypertension   Arthritis    3 month  follow up of chronic medical conditions     HPI: [name] is a 80 y.o. female presenting on 11/19/2020 for Hypertension and Arthritis (3 month follow up of chronic medical conditions )  Patient is accompanied by her daughter-in-law who she is okay with being present. She has been taking 2 tylenol prior to taking  doses of Norco, of which she only takes 2 per day. She states her pain is reduced to a 5/10 after taking the the Norco, but the tylenol is ineffective.   Pain assessment: Cause of pain- arthritis, multiple recent fractures Pain location- back, knees, hips Pain on scale of 1-10- 6/10 Frequency- daily What increases pain- activity What makes pain better- medication Effects on ADL- unable to complete ADLs without assistance Any change in general medical condition- no  Current opioids rx- Norco 5/325 PO every 6 hours as needed # meds rx- 60 Effectiveness of current meds- effective Adverse reactions from pain meds- none Morphine equivalent- 20 MME/day  Pill count performed-No Last drug screen - 05/19/2020 ( high risk q74m moderate risk q646mlow risk yearly ) Drug screen today- No Was the [place]- Yes  If yes were their any concerning findings? - No  Overdose risk: 100  Opioid Risk  09/04/2019  Alcohol 0  Illegal Drugs 0  Rx Drugs 0  Alcohol 0  Illegal Drugs 0  Rx Drugs 0  Age between 16-45 years  0  History of Preadolescent Sexual Abuse 0  Psychological Disease 0  Depression 0  Opioid Risk Tool Scoring 0  Opioid Risk Interpretation Low Risk   Pain contract signed on: 05/19/2020  Hypertension: BP slightly elevated today.  Patient has a log of BP from dialysis that have been persistently high since January 2022. Her cardiologist ordered hydralazine but she is unsure if she has been taking it. Her daughter in-law states that she is taking a blood pressure medication that starts with an 'M' but is unsure where the prescription came from.   Anxiety/Depression: She states she has been more anxious lately due to her pain. She is interested in medications to help manage her symptoms. She was on Celexa in the past and is unsure why it was stopped. [facility] 11/19/2020 08/18/2020 08/04/2020  Decreased Interest 1 0 0  Down, Depressed, Hopeless 1 1 0  PHQ - 2 Score 2 1  0  Altered sleeping 3 3 -  Tired, decreased energy 3 3 -  Change in appetite 2 0 -  Feeling bad or failure about yourself  3 2 -  Trouble concentrating 2 0 -  Moving slowly or fidgety/restless 1 1 -  Suicidal thoughts 2 0 -  PHQ-9 Score 18 10 -  Difficult doing work/chores Very difficult Somewhat difficult -  Some recent data might be hidden   GAD 7 : Generalized Anxiety Score 11/19/2020 08/18/2020 05/19/2020 09/04/2019  Nervous, Anxious, on Edge 2 0 2 0  Control/stop worrying 3 2 0 1  Worry too much - different things _0 Trouble relaxing _1 0  Restless 0 3 0 0  Easily annoyed or irritable _2 Afraid - awful might happen _3 0  Total GAD 7 Score _4 Anxiety Difficulty Very difficult Somewhat difficult Somewhat difficult -    Social history:  Relevant past medical, surgical, family and social history reviewed and updated as indicated. Interim medical history since our last visit reviewed.  Allergies and medications reviewed and updated.  DATA REVIEWED: CHART IN EPIC  ROS: Negative unless specifically indicated above in HPI.    Current Outpatient Medications:    acetaminophen (TYLENOL) 500 MG tablet, Take 2 tablets (1,000 mg total) by mouth every 8 (eight) hours as needed for mild pain or headache., Disp: 30 tablet, Rfl: 0   albuterol (VENTOLIN HFA) 108 (90 Base) MCG/ACT inhaler, Inhale 2 puffs into the lungs every 6 (six) hours as needed for wheezing or shortness of breath., Disp: 18 g, Rfl: 2   alendronate (FOSAMAX) 70 MG tablet, Take 1 tablet (70 mg total) by mouth every Thursday., Disp: 12 tablet, Rfl: 3   AURYXIA 1 GM 210 MG(Fe) tablet, Take 210 mg by mouth 3 (three) times daily with meals., Disp: , Rfl:    budesonide-formoterol (SYMBICORT) 80-4.5 MCG/ACT inhaler, Inhale 2 puffs into the lungs 2 (two) times daily., Disp: 3 each, Rfl: 4   cinacalcet (SENSIPAR) 30 MG tablet, Take 1 tablet (30 mg total) by mouth every Monday, Wednesday, and Friday., Disp: 60  tablet, Rfl: 0   citalopram (CELEXA) 20 MG tablet, Take 1 tablet (20 mg total) by mouth daily., Disp: 30 tablet, Rfl: 2   doxercalciferol (HECTOROL) 4 MCG/2ML injection, Inject 0.5 mLs (1 mcg total) into the vein every Monday, Wednesday, and Friday with hemodialysis., Disp: 2 mL, Rfl: 0   ezetimibe (ZETIA) 10 MG tablet, Take 1 tablet (10 mg total) by mouth daily., Disp: 90 tablet, Rfl: 3   hydrALAZINE (APRESOLINE) 10 MG tablet, Take 1 tablet (10 mg total) by mouth in the morning and at bedtime., Disp: 60 tablet, Rfl: 11   levocetirizine (XYZAL) 5 MG tablet, Take 1 tablet (5 mg total) by mouth every evening., Disp: 30 tablet, Rfl: 2   levothyroxine (SYNTHROID) 112 MCG tablet, Take 1 tablet (112 mcg total) by mouth daily., Disp: 90 tablet, Rfl: 1   METOPROLOL SUCCINATE PO, Take by mouth., Disp: , Rfl:    multivitamin (RENA-VIT) TABS tablet, Take 1 tablet by mouth daily., Disp: , Rfl:    nystatin cream (MYCOSTATIN), Apply 1 application topically 2 (two) times daily as needed for dry skin., Disp: 30 g, Rfl: 2   Vitamin D, Ergocalciferol, (DRISDOL) 1.25 MG (50000 UNIT) CAPS capsule, Take 1 capsule (50,000 Units total) by mouth every Monday., Disp: 12 capsule, Rfl: 3   docusate calcium (SURFAK) 240 MG capsule, Take 1 capsule (240 mg total) by mouth daily., Disp: 90 capsule, Rfl: 1   famotidine (PEPCID) 20 MG tablet, Take 1 tablet (20 mg total) by mouth 2 (two) times daily., Disp: 180 tablet, Rfl: 1   [START ON 01/18/2021] HYDROcodone-acetaminophen (NORCO/VICODIN) 5-325 MG tablet, Take 1 tablet by mouth every 6 (six) hours as needed for moderate pain., Disp: 60 tablet, Rfl: 0   [START ON 12/19/2020] HYDROcodone-acetaminophen (NORCO/VICODIN) 5-325 MG tablet, Take 1 tablet by mouth every 6 (six) hours as needed for moderate pain., Disp: 60 tablet, Rfl: 0   HYDROcodone-acetaminophen (NORCO/VICODIN) 5-325 MG tablet, Take 1 tablet by mouth every 6 (six) hours as needed for moderate pain., Disp: 60 tablet, Rfl: 0    pantoprazole (PROTONIX) 20 MG tablet, Take 1 tablet (20 mg total) by mouth daily., Disp: 90 tablet, Rfl: 1   pramipexole (MIRAPEX) 0.125 MG tablet, Take 1 tablet (0.125 mg total) by mouth at bedtime., Disp: 90 tablet, Rfl: 1   Allergies  Allergen Reactions   Amoxicillin Rash    Has patient had a PCN reaction causing immediate rash, facial/tongue/throat swelling,  SOB or lightheadedness with hypotension:YES Has patient had a PCN reaction causing severe rash involving mucus membranes or skin necrosis: Yes Has patient had a PCN reaction that required hospitalization No Has patient had a PCN reaction occurring within the last 10 years: Yes If all of the above answers are "NO", then may proceed with Cephalosporin use.    Elemental Sulfur Hives   Sulfur Hives   Peg 3350-Electrolytes Other (See Comments)   Sulfa Drugs Cross Reactors Hives and Itching   Miralax [Polyethylene Glycol] Itching   Mixed Grasses    Percocet [Oxycodone-Acetaminophen] Itching and Rash    Did not happen last time she took it   Sulfa Antibiotics Hives   Past Medical History:  Diagnosis Date   Acute respiratory failure ([place]) 05/2017   Anemia    Anxiety    Arthritis    COPD (chronic obstructive pulmonary disease) (HCC)    Depression    ESRD (end stage renal disease) (HCC)    dialysis MWF   GERD (gastroesophageal reflux disease)    Gout    History of blood transfusion    History of diabetes mellitus    "diet controlled"   HLD (hyperlipidemia)    HOH (hard of hearing)    left ear   Hypertension    hypotensive -since starting dialysis   Hypothyroidism    MRSA (methicillin resistant staph aureus) culture positive 06/01/2017   PAF (paroxysmal atrial fibrillation) ([place])    a. Echo 11/16:  Mild LVH, EF 55-60%, normal wall motion, MAC, mild MR, severe LAE (49 ml/m2), mild RVE, normal RVSF, mild RAE, mild TR, PASP 24 mmHg;  CHADS2-VASc: 4 >> Coumadin followed by PCP    Past Surgical History:  Procedure Laterality  Date   AV FISTULA PLACEMENT  04/03/2012   Procedure: ARTERIOVENOUS (AV) FISTULA CREATION;  Surgeon: Elam Dutch, MD;  Location: [facility];  Service: Vascular;  Laterality: Left;  creation left brachial cephalic fistula    BIOPSY  08/21/2018   Procedure: BIOPSY;  Surgeon: Thornton Park, MD;  Location: [place];  Service: Gastroenterology;;   CARPAL TUNNEL RELEASE Bilateral    COLONOSCOPY W/ POLYPECTOMY     COLONOSCOPY WITH PROPOFOL N/A 08/21/2018   Procedure: COLONOSCOPY WITH PROPOFOL;  Surgeon: Thornton Park, MD;  Location: [facility];  Service: Gastroenterology;  Laterality: N/A;   DILATION AND CURETTAGE OF UTERUS     ESOPHAGOGASTRODUODENOSCOPY (EGD) WITH PROPOFOL N/A 08/21/2018   Procedure: ESOPHAGOGASTRODUODENOSCOPY (EGD) WITH PROPOFOL;  Surgeon: Thornton Park, MD;  Location: [place];  Service: Gastroenterology;  Laterality: N/A;   EYE SURGERY Bilateral    bilateral cataract removal   HEMATOMA EVACUATION Left 05/14/2018   Procedure: Incision and Drainage of Left Arm Hematoma;  Surgeon: Elam Dutch, MD;  Location: [place];  Service: Vascular;  Laterality: Left;   Hemodialysis  catheter Right    IR FLUORO GUIDE CV LINE RIGHT  05/26/2017   IR REMOVAL TUN CV CATH W/O FL  05/24/2017   IR US GUIDE VASC ACCESS RIGHT  05/26/2017   LIGATION OF ARTERIOVENOUS  FISTULA Left 02/04/2015   Procedure: LIGATION OF BRACHIOCEPHALIC ARTERIOVENOUS  FISTULA;  Surgeon: Conrad Sitka, MD;  Location: [place];  Service: Vascular;  Laterality: Left;   MULTIPLE TOOTH EXTRACTIONS     PARTIAL HIP ARTHROPLASTY     POLYPECTOMY  08/21/2018   Procedure: POLYPECTOMY;  Surgeon: Thornton Park, MD;  Location: [place];  Service: Gastroenterology;;   PORTACATH PLACEMENT     REVERSE SHOULDER ARTHROPLASTY Left 01/22/2016  Procedure: LEFT REVERSE SHOULDER ARTHROPLASTY;  Surgeon: Netta Cedars, MD;  Location: [place];  Service: Orthopedics;  Laterality: Left;   SHOULDER ARTHROSCOPY Bilateral    SPLIT  NIGHT STUDY  07/26/2015   STERIOD INJECTION Right 01/22/2016   Procedure: RIGHT RING FINGER STEROID INJECTION;  Surgeon: Netta Cedars, MD;  Location: [place];  Service: Orthopedics;  Laterality: Right;   TEE WITHOUT CARDIOVERSION N/A 05/26/2017   Procedure: TRANSESOPHAGEAL ECHOCARDIOGRAM (TEE);  Surgeon: Lelon Perla, MD;  Location: [place];  Service: Cardiovascular;  Laterality: N/A;   THROMBECTOMY BRACHIAL ARTERY Left 02/06/2015   Procedure: EVACUATION OF LEFT ARM HEMATOMA;  Surgeon: Angelia Mould, MD;  Location: [place];  Service: Vascular;  Laterality: Left;   TOTAL KNEE ARTHROPLASTY Bilateral    TUBAL LIGATION      Social History   Socioeconomic History   Marital status: Widowed    Spouse name: Not on file   Number of children: 6   Years of education: 10   Highest education level: 10th grade  Occupational History   Occupation: RETIRED  Tobacco Use   Smoking status: Former    Packs/day: 0.25    Years: 2.00    Pack years: 0.50    Types: Cigarettes    Quit date: 04/04/1998    Years since quitting: 22.6   Smokeless tobacco: Never  Vaping Use   Vaping Use: Never used  Substance and Sexual Activity   Alcohol use: No    Alcohol/week: 0.0 standard drinks   Drug use: No   Sexual activity: Not Currently    Birth control/protection: None  Other Topics Concern   Not on file  Social History Narrative   ** Merged History Encounter **       Widowed 6 children Lives with son and daughter in law homemaker   Social Determinants of Health   Financial Resource Strain: Not on file  Food Insecurity: Not on file  Transportation Needs: Not on file  Physical Activity: Not on file  Stress: Not on file  Social Connections: Not on file  Intimate Partner Violence: Not on file        Objective:    BP (!) 164/94   Pulse 79   Temp 98.1 F (36.7 C) (Temporal)   Ht _0  (1.575 m)   Wt 58.9 kg   SpO2 99%   BMI 23.74 kg/m   Wt Readings from Last 3 Encounters:   11/19/20 58.9 kg  09/09/20 61.2 kg  08/18/20 62.1 kg    Physical Exam Vitals reviewed.  Constitutional:      General: She is not in acute distress.    Appearance: Normal appearance. She is normal weight. She is not ill-appearing, toxic-appearing or diaphoretic.  HENT:     Head: Normocephalic and atraumatic.  Eyes:     General: No scleral icterus.       Right eye: No discharge.        Left eye: No discharge.     Conjunctiva/sclera: Conjunctivae normal.  Cardiovascular:     Rate and Rhythm: Normal rate. Rhythm irregular.     Heart sounds: Normal heart sounds. No murmur heard.   No friction rub. No gallop.  Pulmonary:     Effort: Pulmonary effort is normal. No respiratory distress.     Breath sounds: Normal breath sounds. No stridor. No wheezing, rhonchi or rales.  Musculoskeletal:        General: Normal range of motion.     Left elbow: No swelling, deformity,  effusion or lacerations. Normal range of motion. Tenderness present in olecranon process.     Cervical back: Normal range of motion.     Left knee: Deformity present.  Skin:    General: Skin is warm and dry.     Capillary Refill: Capillary refill takes less than 2 seconds.  Neurological:     General: No focal deficit present.     Mental Status: She is alert and oriented to person, place, and time. Mental status is at baseline.     Gait: Gait abnormal (riding in [facility]).  Psychiatric:        Mood and Affect: Mood normal.        Behavior: Behavior normal.        Thought Content: Thought content normal.        Judgment: Judgment normal.    Lab Results  Component Value Date   TSH 4.470 08/18/2020   Lab Results  Component Value Date   WBC 8.2 08/18/2020   HGB 11.8 08/18/2020   HCT 36.8 08/18/2020   MCV 91 08/18/2020   PLT 305 08/18/2020   Lab Results  Component Value Date   NA 145 (H) 08/18/2020   K 5.7 (H) 08/18/2020   CO2 22 08/18/2020   GLUCOSE 74 08/18/2020   BUN 35 (H) 08/18/2020   CREATININE 5.32 (HH)  08/18/2020   BILITOT 0.2 08/18/2020   ALKPHOS 93 08/18/2020   AST 20 08/18/2020   ALT 13 08/18/2020   PROT 6.5 08/18/2020   ALBUMIN 3.6 (L) 08/18/2020   CALCIUM 9.6 08/18/2020   ANIONGAP 10 07/17/2020   EGFR 8 (L) 08/18/2020   Lab Results  Component Value Date   CHOL 149 05/19/2020   Lab Results  Component Value Date   HDL 77 05/19/2020   Lab Results  Component Value Date   LDLCALC 59 05/19/2020   Lab Results  Component Value Date   TRIG 66 05/19/2020   Lab Results  Component Value Date   CHOLHDL 1.9 05/19/2020   Lab Results  Component Value Date   HGBA1C 4.5 (L) 05/25/2017

## 2020-11-20 DIAGNOSIS — D689 Coagulation defect, unspecified: Secondary | ICD-10-CM | POA: Diagnosis not present

## 2020-11-20 DIAGNOSIS — D631 Anemia in chronic kidney disease: Secondary | ICD-10-CM | POA: Diagnosis not present

## 2020-11-20 DIAGNOSIS — Z992 Dependence on renal dialysis: Secondary | ICD-10-CM | POA: Diagnosis not present

## 2020-11-20 DIAGNOSIS — Z4901 Encounter for fitting and adjustment of extracorporeal dialysis catheter: Secondary | ICD-10-CM | POA: Diagnosis not present

## 2020-11-20 DIAGNOSIS — N2581 Secondary hyperparathyroidism of renal origin: Secondary | ICD-10-CM | POA: Diagnosis not present

## 2020-11-20 DIAGNOSIS — N186 End stage renal disease: Secondary | ICD-10-CM | POA: Diagnosis not present

## 2020-11-20 DIAGNOSIS — R519 Headache, unspecified: Secondary | ICD-10-CM | POA: Diagnosis not present

## 2020-11-23 DIAGNOSIS — Z4901 Encounter for fitting and adjustment of extracorporeal dialysis catheter: Secondary | ICD-10-CM | POA: Diagnosis not present

## 2020-11-23 DIAGNOSIS — Z992 Dependence on renal dialysis: Secondary | ICD-10-CM | POA: Diagnosis not present

## 2020-11-23 DIAGNOSIS — D631 Anemia in chronic kidney disease: Secondary | ICD-10-CM | POA: Diagnosis not present

## 2020-11-23 DIAGNOSIS — R519 Headache, unspecified: Secondary | ICD-10-CM | POA: Diagnosis not present

## 2020-11-23 DIAGNOSIS — N186 End stage renal disease: Secondary | ICD-10-CM | POA: Diagnosis not present

## 2020-11-23 DIAGNOSIS — D689 Coagulation defect, unspecified: Secondary | ICD-10-CM | POA: Diagnosis not present

## 2020-11-23 DIAGNOSIS — N2581 Secondary hyperparathyroidism of renal origin: Secondary | ICD-10-CM | POA: Diagnosis not present

## 2020-11-24 DIAGNOSIS — J9621 Acute and chronic respiratory failure with hypoxia: Secondary | ICD-10-CM | POA: Diagnosis not present

## 2020-11-24 DIAGNOSIS — N185 Chronic kidney disease, stage 5: Secondary | ICD-10-CM | POA: Diagnosis not present

## 2020-11-24 DIAGNOSIS — I480 Paroxysmal atrial fibrillation: Secondary | ICD-10-CM | POA: Diagnosis not present

## 2020-11-24 DIAGNOSIS — M1990 Unspecified osteoarthritis, unspecified site: Secondary | ICD-10-CM | POA: Diagnosis not present

## 2020-11-25 ENCOUNTER — Other Ambulatory Visit: Payer: Self-pay | Admitting: Family Medicine

## 2020-11-25 DIAGNOSIS — D631 Anemia in chronic kidney disease: Secondary | ICD-10-CM | POA: Diagnosis not present

## 2020-11-25 DIAGNOSIS — N186 End stage renal disease: Secondary | ICD-10-CM | POA: Diagnosis not present

## 2020-11-25 DIAGNOSIS — N2581 Secondary hyperparathyroidism of renal origin: Secondary | ICD-10-CM | POA: Diagnosis not present

## 2020-11-25 DIAGNOSIS — R519 Headache, unspecified: Secondary | ICD-10-CM | POA: Diagnosis not present

## 2020-11-25 DIAGNOSIS — D689 Coagulation defect, unspecified: Secondary | ICD-10-CM | POA: Diagnosis not present

## 2020-11-25 DIAGNOSIS — Z992 Dependence on renal dialysis: Secondary | ICD-10-CM | POA: Diagnosis not present

## 2020-11-25 DIAGNOSIS — L299 Pruritus, unspecified: Secondary | ICD-10-CM

## 2020-11-25 DIAGNOSIS — Z4901 Encounter for fitting and adjustment of extracorporeal dialysis catheter: Secondary | ICD-10-CM | POA: Diagnosis not present

## 2020-11-27 ENCOUNTER — Other Ambulatory Visit (HOSPITAL_COMMUNITY): Payer: Self-pay | Admitting: Nephrology

## 2020-11-27 DIAGNOSIS — R519 Headache, unspecified: Secondary | ICD-10-CM | POA: Diagnosis not present

## 2020-11-27 DIAGNOSIS — N186 End stage renal disease: Secondary | ICD-10-CM

## 2020-11-27 DIAGNOSIS — Z992 Dependence on renal dialysis: Secondary | ICD-10-CM | POA: Diagnosis not present

## 2020-11-27 DIAGNOSIS — D689 Coagulation defect, unspecified: Secondary | ICD-10-CM | POA: Diagnosis not present

## 2020-11-27 DIAGNOSIS — Z4901 Encounter for fitting and adjustment of extracorporeal dialysis catheter: Secondary | ICD-10-CM | POA: Diagnosis not present

## 2020-11-27 DIAGNOSIS — N2581 Secondary hyperparathyroidism of renal origin: Secondary | ICD-10-CM | POA: Diagnosis not present

## 2020-11-27 DIAGNOSIS — D631 Anemia in chronic kidney disease: Secondary | ICD-10-CM | POA: Diagnosis not present

## 2020-11-27 DIAGNOSIS — I480 Paroxysmal atrial fibrillation: Secondary | ICD-10-CM | POA: Diagnosis not present

## 2020-11-30 ENCOUNTER — Other Ambulatory Visit: Payer: Self-pay | Admitting: Radiology

## 2020-11-30 DIAGNOSIS — Z4901 Encounter for fitting and adjustment of extracorporeal dialysis catheter: Secondary | ICD-10-CM | POA: Diagnosis not present

## 2020-11-30 DIAGNOSIS — R519 Headache, unspecified: Secondary | ICD-10-CM | POA: Diagnosis not present

## 2020-11-30 DIAGNOSIS — D631 Anemia in chronic kidney disease: Secondary | ICD-10-CM | POA: Diagnosis not present

## 2020-11-30 DIAGNOSIS — Z992 Dependence on renal dialysis: Secondary | ICD-10-CM | POA: Diagnosis not present

## 2020-11-30 DIAGNOSIS — N2581 Secondary hyperparathyroidism of renal origin: Secondary | ICD-10-CM | POA: Diagnosis not present

## 2020-11-30 DIAGNOSIS — D689 Coagulation defect, unspecified: Secondary | ICD-10-CM | POA: Diagnosis not present

## 2020-11-30 DIAGNOSIS — N186 End stage renal disease: Secondary | ICD-10-CM | POA: Diagnosis not present

## 2020-12-01 ENCOUNTER — Ambulatory Visit (HOSPITAL_COMMUNITY)
Admission: RE | Admit: 2020-12-01 | Discharge: 2020-12-01 | Disposition: A | Discharge status: Home / Self Care | Payer: Medicare Other | Source: Ambulatory Visit | Attending: Nephrology | Admitting: Nephrology

## 2020-12-01 ENCOUNTER — Ambulatory Visit (INDEPENDENT_AMBULATORY_CARE_PROVIDER_SITE_OTHER): Payer: Medicare Other

## 2020-12-01 VITALS — Ht 62.0 in | Wt 129.0 lb

## 2020-12-01 DIAGNOSIS — Z0000 Encounter for general adult medical examination without abnormal findings: Secondary | ICD-10-CM

## 2020-12-01 DIAGNOSIS — Z4901 Encounter for fitting and adjustment of extracorporeal dialysis catheter: Secondary | ICD-10-CM | POA: Insufficient documentation

## 2020-12-01 DIAGNOSIS — N186 End stage renal disease: Secondary | ICD-10-CM

## 2020-12-01 MED ORDER — VANCOMYCIN HCL IN DEXTROSE 1-5 GM/200ML-% IV SOLN: 1000.0000 mg | INTRAVENOUS | Status: DC

## 2020-12-01 MED ORDER — SODIUM CHLORIDE 0.9 % IV SOLN: INTRAVENOUS | Status: DC

## 2020-12-01 NOTE — Progress Notes (Signed)
Request to IR for tunneled HD catheter exchange due to concern for infection secondary to presence of possible discharge from tract.  Per patient she previously had a tunneled right chest HD catheter that became infected and needed to be removed several years ago and a new catheter was placed on the left side of her chest at that time and has been working well since then. She states she maybe saw some discharge on the dressing about a week ago but has not noticed any frank pus or other significant drainage. She does note that she tends to have scabs around the catheter from bleeding. She denies any tenderness at the catheter insertion site. She denies fevers, chills, rigors, n/v, chest pain or abdominal pain. She does note that she "stays cold all the time." Her last HD session was Saturday without any issue.  Patient reviewed by Dr. Dwaine Gale who recommends treating with antibiotics prior to exchange/removal due to concern for loss of HD access in patient with a long term catheter present and previously removed catheter presumably within the right IJ. Discussed proceeding with antibiotics in lieu of exchange at this time with Dr. Augustin Coupe who is agreeable to this plan and will have patient setup for antibiotics in HD.  Discussed above with patient who understands and agrees with this plan.  PIV discontinued and patient discharged to home with her daughter.  Candiss Norse, PA-C

## 2020-12-01 NOTE — Progress Notes (Signed)
Subjective:   [name] is a 80 y.o. female who presents for Medicare Annual (Subsequent) preventive examination.  Virtual Visit via Telephone Note  I connected with  [name] on 12/01/20 at 10:30 AM EDT by telephone and verified that I am speaking with the correct person using two identifiers.  Location: Patient: Home Provider: [facility] Persons participating in the virtual visit: patient/daughter in law, [name]   I discussed the limitations, risks, security and privacy concerns of performing an evaluation and management service by telephone and the availability of in person appointments. The patient expressed understanding and agreed to proceed.  Interactive audio and video telecommunications were attempted between this nurse and patient, however failed, due to patient having technical difficulties OR patient did not have access to video capability.  We continued and completed visit with audio only.  Some vital signs may be absent or patient reported.   [name] E Michelle Cando, LPN   Review of Systems     Cardiac Risk Factors include: advanced age (>55mn, >>68women);dyslipidemia;hypertension;sedentary lifestyle;Other (see comment), Risk factor comments: ESRD, COPD, Anemia, malnutrition     Objective:    Today's Vitals   12/01/20 1048  Weight: 129 lb (58.5 kg)  Height: _0  (1.575 m)  PainSc: 5    Body mass index is 23.59 kg/m.  Advanced Directives 06/02/2020 03/19/2020 01/16/2020 08/06/2019 09/06/2018 08/20/2018 08/19/2018  Does Patient Have a Medical Advance Directive? Yes Yes Yes No Yes Yes Yes  Type of Advance Directive - - - - HPress photographerOut of facility DNR (pink MOST or yellow form) [place] of [place] (pink MOST or yellow form);Living will Out of facility DNR (pink MOST or yellow form);Healthcare Power of Attorney  Does patient want to make changes to medical advance directive? - - - - No - Patient declined No -  Patient declined -  Copy of [place] Chart? - - - - Yes - validated most recent copy scanned in chart (See row information) - No - copy requested  Would patient like information on creating a medical advance directive? - - - No - Patient declined No - Patient declined No - Patient declined No - Patient declined  Pre-existing out of facility DNR order (yellow form or pink MOST form) - - - - Yellow form placed in chart (order not valid for inpatient use) - Yellow form placed in chart (order not valid for inpatient use)    Current Medications (verified) Outpatient Encounter Medications as of 12/01/2020  Medication Sig   albuterol (VENTOLIN HFA) 108 (90 Base) MCG/ACT inhaler Inhale 2 puffs into the lungs every 6 (six) hours as needed for wheezing or shortness of breath.   alendronate (FOSAMAX) 70 MG tablet Take 1 tablet (70 mg total) by mouth every Thursday.   AURYXIA 1 GM 210 MG(Fe) tablet Take 210 mg by mouth 3 (three) times daily with meals.   budesonide-formoterol (SYMBICORT) 80-4.5 MCG/ACT inhaler Inhale 2 puffs into the lungs 2 (two) times daily.   cinacalcet (SENSIPAR) 30 MG tablet Take 1 tablet (30 mg total) by mouth every Monday, Wednesday, and Friday.   citalopram (CELEXA) 20 MG tablet Take 1 tablet (20 mg total) by mouth daily.   docusate calcium (SURFAK) 240 MG capsule Take 1 capsule (240 mg total) by mouth daily.   doxercalciferol (HECTOROL) 4 MCG/2ML injection Inject 0.5 mLs (1 mcg total) into the vein every Monday, Wednesday, and Friday with hemodialysis.   ezetimibe (  ZETIA) 10 MG tablet Take 1 tablet (10 mg total) by mouth daily.   famotidine (PEPCID) 20 MG tablet Take 1 tablet (20 mg total) by mouth 2 (two) times daily.   hydrALAZINE (APRESOLINE) 10 MG tablet Take 1 tablet (10 mg total) by mouth in the morning and at bedtime.   HYDROcodone-acetaminophen (NORCO/VICODIN) 5-325 MG tablet Take 1 tablet by mouth every 6 (six) hours as needed for moderate pain.    levocetirizine (XYZAL) 5 MG tablet TAKE 1 TABLET BY MOUTH EVERY DAY IN THE EVENING   levothyroxine (SYNTHROID) 112 MCG tablet Take 1 tablet (112 mcg total) by mouth daily.   pantoprazole (PROTONIX) 20 MG tablet Take 1 tablet (20 mg total) by mouth daily.   pramipexole (MIRAPEX) 0.125 MG tablet Take 1 tablet (0.125 mg total) by mouth at bedtime.   Vitamin D, Ergocalciferol, (DRISDOL) 1.25 MG (50000 UNIT) CAPS capsule Take 1 capsule (50,000 Units total) by mouth every Monday.   [DISCONTINUED] Cholecalciferol (VITAMIN D) 125 MCG (5000 UT) CAPS Take by mouth.   acetaminophen (TYLENOL) 500 MG tablet Take 2 tablets (1,000 mg total) by mouth every 8 (eight) hours as needed for mild pain or headache. (Patient not taking: Reported on 12/01/2020)   [START ON 01/18/2021] HYDROcodone-acetaminophen (NORCO/VICODIN) 5-325 MG tablet Take 1 tablet by mouth every 6 (six) hours as needed for moderate pain.   [START ON 12/19/2020] HYDROcodone-acetaminophen (NORCO/VICODIN) 5-325 MG tablet Take 1 tablet by mouth every 6 (six) hours as needed for moderate pain.   multivitamin (RENA-VIT) TABS tablet Take 1 tablet by mouth daily.   nystatin cream (MYCOSTATIN) Apply 1 application topically 2 (two) times daily as needed for dry skin.   [DISCONTINUED] METOPROLOL SUCCINATE PO Take by mouth.   No facility-administered encounter medications on file as of 12/01/2020.    Allergies (verified) Amoxicillin, Elemental sulfur, Sulfur, Peg 3350-electrolytes, Sulfa drugs cross reactors, Miralax [polyethylene glycol], Mixed grasses, Percocet [oxycodone-acetaminophen], and Sulfa antibiotics   History: Past Medical History:  Diagnosis Date   Acute respiratory failure ([place]) 05/2017   Anemia    Anxiety    Arthritis    COPD (chronic obstructive pulmonary disease) ([place])    Depression    ESRD (end stage renal disease) ([place])    dialysis MWF   GERD (gastroesophageal reflux disease)    Gout    History of blood transfusion    History of  diabetes mellitus    "diet controlled"   HLD (hyperlipidemia)    HOH (hard of hearing)    left ear   Hypertension    hypotensive -since starting dialysis   Hypothyroidism    MRSA (methicillin resistant staph aureus) culture positive 06/01/2017   PAF (paroxysmal atrial fibrillation) ([place])    a. Echo 11/16:  Mild LVH, EF 55-60%, normal wall motion, MAC, mild MR, severe LAE (49 ml/m2), mild RVE, normal RVSF, mild RAE, mild TR, PASP 24 mmHg;  CHADS2-VASc: 4 >> Coumadin followed by PCP   Past Surgical History:  Procedure Laterality Date   AV FISTULA PLACEMENT  04/03/2012   Procedure: ARTERIOVENOUS (AV) FISTULA CREATION;  Surgeon: Elam Dutch, MD;  Location: [place];  Service: Vascular;  Laterality: Left;  creation left brachial cephalic fistula    BIOPSY  08/21/2018   Procedure: BIOPSY;  Surgeon: Thornton Park, MD;  Location: [facility];  Service: Gastroenterology;;   CARPAL TUNNEL RELEASE Bilateral    COLONOSCOPY W/ POLYPECTOMY     COLONOSCOPY WITH PROPOFOL N/A 08/21/2018   Procedure: COLONOSCOPY WITH PROPOFOL;  Surgeon: Tarri Glenn,  Joelene Millin, MD;  Location: [place];  Service: Gastroenterology;  Laterality: N/A;   DILATION AND CURETTAGE OF UTERUS     ESOPHAGOGASTRODUODENOSCOPY (EGD) WITH PROPOFOL N/A 08/21/2018   Procedure: ESOPHAGOGASTRODUODENOSCOPY (EGD) WITH PROPOFOL;  Surgeon: Thornton Park, MD;  Location: [place];  Service: Gastroenterology;  Laterality: N/A;   EYE SURGERY Bilateral    bilateral cataract removal   HEMATOMA EVACUATION Left 05/14/2018   Procedure: Incision and Drainage of Left Arm Hematoma;  Surgeon: Elam Dutch, MD;  Location: [place];  Service: Vascular;  Laterality: Left;   Hemodialysis  catheter Right    IR FLUORO GUIDE CV LINE RIGHT  05/26/2017   IR REMOVAL TUN CV CATH W/O FL  05/24/2017   IR US GUIDE VASC ACCESS RIGHT  05/26/2017   LIGATION OF ARTERIOVENOUS  FISTULA Left 02/04/2015   Procedure: LIGATION OF BRACHIOCEPHALIC ARTERIOVENOUS  FISTULA;   Surgeon: Conrad Knollwood, MD;  Location: [place];  Service: Vascular;  Laterality: Left;   MULTIPLE TOOTH EXTRACTIONS     PARTIAL HIP ARTHROPLASTY     POLYPECTOMY  08/21/2018   Procedure: POLYPECTOMY;  Surgeon: Thornton Park, MD;  Location: [place];  Service: Gastroenterology;;   PORTACATH PLACEMENT     REVERSE SHOULDER ARTHROPLASTY Left 01/22/2016   Procedure: LEFT REVERSE SHOULDER ARTHROPLASTY;  Surgeon: Netta Cedars, MD;  Location: [employer] [facility];  Service: Orthopedics;  Laterality: Left;   SHOULDER ARTHROSCOPY Bilateral    SPLIT NIGHT STUDY  07/26/2015   STERIOD INJECTION Right 01/22/2016   Procedure: RIGHT RING FINGER STEROID INJECTION;  Surgeon: Netta Cedars, MD;  Location: [place];  Service: Orthopedics;  Laterality: Right;   TEE WITHOUT CARDIOVERSION N/A 05/26/2017   Procedure: TRANSESOPHAGEAL ECHOCARDIOGRAM (TEE);  Surgeon: Lelon Perla, MD;  Location[facility] ENDOSCOPY;  Service: Cardiovascular;  Laterality: N/A;   THROMBECTOMY BRACHIAL ARTERY Left 02/06/2015   Procedure: EVACUATION OF LEFT ARM HEMATOMA;  Surgeon: Angelia Mould, MD;  Location[facility] OR;  Service: Vascular;  Laterality: Left;   TOTAL KNEE ARTHROPLASTY Bilateral    TUBAL LIGATION     Family History  Problem Relation Age of Onset   Arthritis Mother    Diabetes Father        before age 61   Heart disease Father    Diabetes Sister    Cancer Brother    Hyperlipidemia Daughter    Hypertension Daughter    Diabetes Daughter    Hypertension Son    Hyperlipidemia Son    Dementia Sister    Cancer Sister        Unknown   Other Sister        Died in car accident   Stroke Brother    Diabetes Daughter    Hypertension Daughter    Hyperlipidemia Daughter    Hepatitis C Son    Social History   Socioeconomic History   Marital status: Widowed    Spouse name: Not on file   Number of children: 6   Years of education: 10   Highest education level: 10th grade  Occupational History   Occupation: RETIRED  Tobacco Use    Smoking status: Former    Packs/day: 0.25    Years: 2.00    Pack years: 0.50    Types: Cigarettes    Quit date: 04/04/1998    Years since quitting: 22.6   Smokeless tobacco: Never  Vaping Use   Vaping Use: Never used  Substance and Sexual Activity   Alcohol use: No    Alcohol/week: 0.0 standard  drinks   Drug use: No   Sexual activity: Not Currently    Birth control/protection: None  Other Topics Concern   Not on file  Social History Narrative   ** Merged History Encounter ** Widowed   6 children   Lives with son and daughter in [place] [place]   ESRD - dialysis 3x per week   Cognitive impairment   Social Determinants of Health   Financial Resource Strain: Low Risk    Difficulty of Paying Living Expenses: Not hard at all  Food Insecurity: No Food Insecurity   Worried About Charity fundraiser in the Last Year: Never true   Arboriculturist in the Last Year: Never true  Transportation Needs: No Transportation Needs   Lack of Transportation (Medical): No   Lack of Transportation (Non-Medical): No  Physical Activity: Inactive   Days of Exercise per Week: 0 days   Minutes of Exercise per Session: 0 min  Stress: No Stress Concern Present   Feeling of Stress : Only a little  Social Connections: Moderately Isolated   Frequency of Communication with Friends and Family: More than three times a week   Frequency of Social Gatherings with Friends and Family: More than three times a week   Attends Religious Services: 1 to 4 times per year   Active Member of [employer] or Organizations: No   Attends Archivist Meetings: Never   Marital Status: Widowed    Tobacco Counseling Counseling given: Not Answered   Clinical Intake:  Pre-visit preparation completed: Yes  Pain : 0-10 Pain Score: 5  Pain Type: Chronic pain Pain Location: Hip Pain Orientation: Left Pain Descriptors / Indicators: Aching, Sore, Discomfort, Tender Pain Onset: More than a month ago Pain Frequency:  Intermittent     BMI - recorded: 23.59 Nutritional Status: BMI of 19-24  Normal Nutritional Risks: None Diabetes: No  How often do you need to have someone help you when you read instructions, pamphlets, or other written materials from your doctor or pharmacy?: 1 - Never  Diabetic? No  Interpreter Needed?: No  Information entered by :: Renlee Floor, LPN   Activities of Daily Living In your present state of health, do you have any difficulty performing the following activities: 12/01/2020 07/14/2020  Hearing? Y Y  Comment L ear - deaf - declines hearing aid -  Vision? N N  Difficulty concentrating or making decisions? Tempie Donning  Walking or climbing stairs? Y Y  Dressing or bathing? Y Y  Doing errands, shopping? Tempie Donning  Comment daughter in law drives -  Conservation officer, nature and eating ? Y -  Comment eats fine on her own, cannot prepare meals -  Using the Toilet? Y -  In the past six months, have you accidently leaked urine? Y -  Comment wears depends - has bedside commode she uses 50% of the time when she can make it -  Do you have problems with loss of bowel control? Y -  Managing your Medications? Y -  Comment sometimes remembers - daughter in law reminds her and sets up meds for the week -  Managing your Finances? Y -  Housekeeping or managing your Housekeeping? Y -  Some recent data might be hidden    Patient Care Team: [name], FNP as PCP - General (Family Medicine) Minus Breeding, MD as Consulting Physician (Cardiology) Tanda Rockers, MD as Consulting Physician (Pulmonary Disease) Jamal Maes, MD as Consulting Physician (Nephrology) Steffanie Rainwater,  DPM as Consulting Physician (Podiatry) Rosita Fire, MD as Consulting Physician (Nephrology)  Indicate any recent Medical Services you may have received from other than [facility] providers in the past year (date may be approximate).     Assessment:   This is a routine wellness examination for Hunter.  Hearing/Vision  screen Hearing Screening - Comments:: Deaf in left ear - no hearing difficulties in right ear - declines hearing aid Vision Screening - Comments:: Wears glasses - up to date with annual eye exams with Dr Marin Comment in [place]  Dietary issues and exercise activities discussed: Current Exercise Habits: The patient does not participate in regular exercise at present, Exercise limited by: orthopedic condition(s);respiratory conditions(s)   Goals Addressed             This Visit's Progress    Patient Stated   On track    08/06/2019 AWV Goal: Keep All Scheduled Appointments  Over the next year, patient will attend all scheduled appointments with their PCP and any specialists that they see.        Depression Screen PHQ 2/9 Scores 12/01/2020 11/19/2020 08/18/2020 08/04/2020 07/06/2020 05/19/2020 03/10/2020  PHQ - 2 Score _0 0 0 3 0  PHQ- 9 Score _1 - 0 21 0    Fall Risk Fall Risk  12/01/2020 11/19/2020 08/18/2020 08/04/2020 03/10/2020  Roman in the past year? _2 0 1  Number Roman in past yr: _3 - 1  Injury with Fall? _4 - 1  Comment - - - - Broken hip  Risk for fall due to : History of fall(s);Impaired balance/gait;Impaired vision;Medication side effect;Mental status change;Orthopedic patient - History of fall(s) - History of fall(s);Impaired balance/gait  Follow up Education provided;Roman prevention discussed Roman prevention discussed Roman evaluation completed - Education provided    FALL RISK PREVENTION PERTAINING TO THE HOME:  Any stairs in or around the home? No  If so, are there any without handrails? No  Home free of loose throw rugs in walkways, pet beds, electrical cords, etc? Yes  Adequate lighting in your home to reduce risk of Roman? Yes   ASSISTIVE DEVICES UTILIZED TO PREVENT Roman:  Life alert? No  Use of a cane, walker or w/c? Yes  Grab bars in the bathroom? Yes  Shower chair or bench in shower? Yes  - has bedside commode and usually gets bed baths Elevated toilet  seat or a handicapped toilet? Yes   TIMED UP AND GO:  Was the test performed? No . Telephonic visit  Cognitive Function: Cognitive status assessed by direct observation. Patient is unable to complete screening 6CIT or MMSE. Abnormal score last year - her daughter in law says her memory is declining - patient declines memory test today  MMSE - Mini Mental State Exam 06/04/2019  Orientation to time 5  Orientation to Place 3  Registration 3  Attention/ Calculation 5  Recall 2  Language- name 2 objects 2  Language- repeat 1  Language- follow 3 step command 3  Language- read & follow direction 1  Write a sentence 0  Copy design 1  Total score 26     6CIT Screen 08/06/2019  What Year? 0 points  What month? 3 points  What time? 0 points  Count back from 20 0 points  Months in reverse 0 points  Repeat phrase 4 points  Total Score 7    Immunizations Immunization History  Administered Date(s) Administered   Fluad Quad(high Dose  65+) 03/10/2020   Hepatitis B, adult 12/27/2014, 02/25/2015, 03/27/2015, 06/26/2015, 10/02/2015, 11/02/2015, 12/02/2015, 04/01/2016   Influenza Split 02/03/2015   Influenza Whole 01/05/2015, 01/03/2016   Influenza, High Dose Seasonal PF 01/05/2015, 12/26/2017, 12/21/2018, 03/10/2020, 05/27/2020   Influenza, Seasonal, Injecte, Preservative Fre 12/26/2016   Influenza,inj,quad, With Preservative 01/26/2018   Influenza-Unspecified 03/25/2014, 12/26/2016, 01/26/2018, 12/21/2018, 03/10/2020   Moderna Sars-Covid-2 Vaccination 08/13/2019, 09/10/2019, 03/26/2020   Pneumococcal Conjugate-13 03/10/2015, 03/25/2015, 04/05/2015, 04/27/2016   Pneumococcal Polysaccharide-23 03/25/2014   Tdap 11/09/2018, 02/11/2020    TDAP status: Up to date  Flu Vaccine status: Up to date  Pneumococcal vaccine status: Up to date  Covid-19 vaccine status: Completed vaccines  Qualifies for Shingles Vaccine? Yes   Zostavax completed No   Shingrix Completed?: No.    Education has  been provided regarding the importance of this vaccine. Patient has been advised to call insurance company to determine out of pocket expense if they have not yet received this vaccine. Advised may also receive vaccine at local pharmacy or Health Dept. Verbalized acceptance and understanding.  Screening Tests Health Maintenance  Topic Date Due   FOOT EXAM  Never done   HEMOGLOBIN A1C  11/22/2017   OPHTHALMOLOGY EXAM  01/08/2020   COVID-19 Vaccine (4 - Booster for Moderna series) 12/05/2020 (Originally 06/24/2020)   INFLUENZA VACCINE  01/05/2021 (Originally 11/02/2020)   Zoster Vaccines- Shingrix (1 of 2) 02/19/2021 (Originally 04/28/1959)   TETANUS/TDAP  02/10/2030   DEXA SCAN  Completed   PNA vac Low Risk Adult  Completed   HPV VACCINES  Aged Out    Health Maintenance  Health Maintenance Due  Topic Date Due   FOOT EXAM  Never done   HEMOGLOBIN A1C  11/22/2017   OPHTHALMOLOGY EXAM  01/08/2020    Colorectal cancer screening: No longer required.   Mammogram status: No longer required due to age.  Bone Density status: Completed 05/16/2019. Results reflect: Bone density results: OSTEOPOROSIS. Repeat every 2 years.  Lung Cancer Screening: (Low Dose CT Chest recommended if Age 69-80 years, 30 pack-year currently smoking OR have quit w/in 15years.) does not qualify.   Additional Screening:  Hepatitis C Screening: does not qualify  Vision Screening: Recommended annual ophthalmology exams for early detection of glaucoma and other disorders of the eye. Is the patient up to date with their annual eye exam?  Yes  Who is the provider or what is the name of the office in which the patient attends annual eye exams? [facility] If pt is not established with a provider, would they like to be referred to a provider to establish care? No .   Dental Screening: Recommended annual dental exams for proper oral hygiene  Community Resource Referral / Chronic Care Management: CRR required this  visit?  No   CCM required this visit?  No      Plan:     I have personally reviewed and noted the following in the patient's chart:   Medical and social history Use of alcohol, tobacco or illicit drugs  Current medications and supplements including opioid prescriptions.  Functional ability and status Nutritional status Physical activity Advanced directives List of other physicians Hospitalizations, surgeries, and ER visits in previous 12 months Vitals Screenings to include cognitive, depression, and Roman Referrals and appointments  In addition, I have reviewed and discussed with patient certain preventive protocols, quality metrics, and best practice recommendations. A written personalized care plan for preventive services as well as general preventive health recommendations were provided to patient.  Sandrea Hammond, LPN   [id number]   Nurse Notes: Dialysis nurse said her BP is running too high and thinks she needs medications adjusted. Daughter in law says she doesn't think Hydralazine is helping. Takes 61m daily.

## 2020-12-01 NOTE — Patient Instructions (Signed)
[name] , Thank you for taking time to come for your Medicare Wellness Visit. I appreciate your ongoing commitment to your health goals. Please review the following plan we discussed and let me know if I can assist you in the future.   Screening recommendations/referrals: Colonoscopy: Done 5/19/220 - Repeat not required Mammogram: No longer required, but still recommended every 1-2 years Bone Density: Done 05/16/2019 - Repeat every 2 years Recommended yearly ophthalmology/optometry visit for glaucoma screening and checkup Recommended yearly dental visit for hygiene and checkup  Vaccinations: Influenza vaccine: Done 05/27/2020 - Repeat every fall Pneumococcal vaccine: Done 03/25/2014 & 04/27/2016 Tdap vaccine: Done 02/11/2020 - Repeat in 10 years Shingles vaccine: Due. Shingrix discussed. Please contact your pharmacy for coverage information.     Covid-19: Done 08/13/19, 09/10/19, 03/26/20  Advanced directives: in chart  Conditions/risks identified: Work on fall prevention measures, eating 3 meals per day and/or supplementing with Ensure.  Next appointment: Follow up in one year for your annual wellness visit    Preventive Care 65 Years and Older, Female Preventive care refers to lifestyle choices and visits with your health care provider that can promote health and wellness. What does preventive care include? A yearly physical exam. This is also called an annual well check. Dental exams once or twice a year. Routine eye exams. Ask your health care provider how often you should have your eyes checked. Personal lifestyle choices, including: Daily care of your teeth and gums. Regular physical activity. Eating a healthy diet. Avoiding tobacco and drug use. Limiting alcohol use. Practicing safe sex. Taking low-dose aspirin every day. Taking vitamin and mineral supplements as recommended by your health care provider. What happens during an annual well check? The services and screenings  done by your health care provider during your annual well check will depend on your age, overall health, lifestyle risk factors, and family history of disease. Counseling  Your health care provider may ask you questions about your: Alcohol use. Tobacco use. Drug use. Emotional well-being. Home and relationship well-being. Sexual activity. Eating habits. History of falls. Memory and ability to understand (cognition). Work and work Statistician. Reproductive health. Screening  You may have the following tests or measurements: Height, weight, and BMI. Blood pressure. Lipid and cholesterol levels. These may be checked every 5 years, or more frequently if you are over 92 years old. Skin check. Lung cancer screening. You may have this screening every year starting at age 65 if you have a 30-pack-year history of smoking and currently smoke or have quit within the past 15 years. Fecal occult blood test (FOBT) of the stool. You may have this test every year starting at age 46. Flexible sigmoidoscopy or colonoscopy. You may have a sigmoidoscopy every 5 years or a colonoscopy every 10 years starting at age 98. Hepatitis C blood test. Hepatitis B blood test. Sexually transmitted disease (STD) testing. Diabetes screening. This is done by checking your blood sugar (glucose) after you have not eaten for a while (fasting). You may have this done every 1-3 years. Bone density scan. This is done to screen for osteoporosis. You may have this done starting at age 95. Mammogram. This may be done every 1-2 years. Talk to your health care provider about how often you should have regular mammograms. Talk with your health care provider about your test results, treatment options, and if necessary, the need for more tests. Vaccines  Your health care provider may recommend certain vaccines, such as: Influenza vaccine. This is recommended every  year. Tetanus, diphtheria, and acellular pertussis (Tdap, Td)  vaccine. You may need a Td booster every 10 years. Zoster vaccine. You may need this after age 26. Pneumococcal 13-valent conjugate (PCV13) vaccine. One dose is recommended after age 70. Pneumococcal polysaccharide (PPSV23) vaccine. One dose is recommended after age 47. Talk to your health care provider about which screenings and vaccines you need and how often you need them. This information is not intended to replace advice given to you by your health care provider. Make sure you discuss any questions you have with your health care provider. Document Released: 04/17/2015 Document Revised: 12/09/2015 Document Reviewed: 01/20/2015 Elsevier Interactive Patient Education  2017 [place] Prevention in the Home Falls can cause injuries. They can happen to people of all ages. There are many things you can do to make your home safe and to help prevent falls. What can I do on the outside of my home? Regularly fix the edges of walkways and driveways and fix any cracks. Remove anything that might make you trip as you walk through a door, such as a raised step or threshold. Trim any bushes or trees on the path to your home. Use bright outdoor lighting. Clear any walking paths of anything that might make someone trip, such as rocks or tools. Regularly check to see if handrails are loose or broken. Make sure that both sides of any steps have handrails. Any raised decks and porches should have guardrails on the edges. Have any leaves, snow, or ice cleared regularly. Use sand or salt on walking paths during winter. Clean up any spills in your garage right away. This includes oil or grease spills. What can I do in the bathroom? Use night lights. Install grab bars by the toilet and in the tub and shower. Do not use towel bars as grab bars. Use non-skid mats or decals in the tub or shower. If you need to sit down in the shower, use a plastic, non-slip stool. Keep the floor dry. Clean up any  water that spills on the floor as soon as it happens. Remove soap buildup in the tub or shower regularly. Attach bath mats securely with double-sided non-slip rug tape. Do not have throw rugs and other things on the floor that can make you trip. What can I do in the bedroom? Use night lights. Make sure that you have a light by your bed that is easy to reach. Do not use any sheets or blankets that are too big for your bed. They should not hang down onto the floor. Have a firm chair that has side arms. You can use this for support while you get dressed. Do not have throw rugs and other things on the floor that can make you trip. What can I do in the kitchen? Clean up any spills right away. Avoid walking on wet floors. Keep items that you use a lot in easy-to-reach places. If you need to reach something above you, use a strong step stool that has a grab bar. Keep electrical cords out of the way. Do not use floor polish or wax that makes floors slippery. If you must use wax, use non-skid floor wax. Do not have throw rugs and other things on the floor that can make you trip. What can I do with my stairs? Do not leave any items on the stairs. Make sure that there are handrails on both sides of the stairs and use them. Fix handrails that are broken or  loose. Make sure that handrails are as long as the stairways. Check any carpeting to make sure that it is firmly attached to the stairs. Fix any carpet that is loose or worn. Avoid having throw rugs at the top or bottom of the stairs. If you do have throw rugs, attach them to the floor with carpet tape. Make sure that you have a light switch at the top of the stairs and the bottom of the stairs. If you do not have them, ask someone to add them for you. What else can I do to help prevent falls? Wear shoes that: Do not have high heels. Have rubber bottoms. Are comfortable and fit you well. Are closed at the toe. Do not wear sandals. If you use a  stepladder: Make sure that it is fully opened. Do not climb a closed stepladder. Make sure that both sides of the stepladder are locked into place. Ask someone to hold it for you, if possible. Clearly mark and make sure that you can see: Any grab bars or handrails. First and last steps. Where the edge of each step is. Use tools that help you move around (mobility aids) if they are needed. These include: Canes. Walkers. Scooters. Crutches. Turn on the lights when you go into a dark area. Replace any light bulbs as soon as they burn out. Set up your furniture so you have a clear path. Avoid moving your furniture around. If any of your floors are uneven, fix them. If there are any pets around you, be aware of where they are. Review your medicines with your doctor. Some medicines can make you feel dizzy. This can increase your chance of falling. Ask your doctor what other things that you can do to help prevent falls. This information is not intended to replace advice given to you by your health care provider. Make sure you discuss any questions you have with your health care provider. Document Released: 01/15/2009 Document Revised: 08/27/2015 Document Reviewed: 04/25/2014 Elsevier Interactive Patient Education  2017 [employer].

## 2020-12-02 DIAGNOSIS — Z992 Dependence on renal dialysis: Secondary | ICD-10-CM | POA: Diagnosis not present

## 2020-12-02 DIAGNOSIS — E1122 Type 2 diabetes mellitus with diabetic chronic kidney disease: Secondary | ICD-10-CM | POA: Diagnosis not present

## 2020-12-02 DIAGNOSIS — D631 Anemia in chronic kidney disease: Secondary | ICD-10-CM | POA: Diagnosis not present

## 2020-12-02 DIAGNOSIS — N186 End stage renal disease: Secondary | ICD-10-CM | POA: Diagnosis not present

## 2020-12-02 DIAGNOSIS — Z4901 Encounter for fitting and adjustment of extracorporeal dialysis catheter: Secondary | ICD-10-CM | POA: Diagnosis not present

## 2020-12-02 DIAGNOSIS — R519 Headache, unspecified: Secondary | ICD-10-CM | POA: Diagnosis not present

## 2020-12-02 DIAGNOSIS — D689 Coagulation defect, unspecified: Secondary | ICD-10-CM | POA: Diagnosis not present

## 2020-12-02 DIAGNOSIS — N2581 Secondary hyperparathyroidism of renal origin: Secondary | ICD-10-CM | POA: Diagnosis not present

## 2020-12-04 DIAGNOSIS — N186 End stage renal disease: Secondary | ICD-10-CM | POA: Diagnosis not present

## 2020-12-04 DIAGNOSIS — N2581 Secondary hyperparathyroidism of renal origin: Secondary | ICD-10-CM | POA: Diagnosis not present

## 2020-12-04 DIAGNOSIS — Z992 Dependence on renal dialysis: Secondary | ICD-10-CM | POA: Diagnosis not present

## 2020-12-04 DIAGNOSIS — Z4901 Encounter for fitting and adjustment of extracorporeal dialysis catheter: Secondary | ICD-10-CM | POA: Diagnosis not present

## 2020-12-04 DIAGNOSIS — A499 Bacterial infection, unspecified: Secondary | ICD-10-CM | POA: Diagnosis not present

## 2020-12-04 DIAGNOSIS — D689 Coagulation defect, unspecified: Secondary | ICD-10-CM | POA: Diagnosis not present

## 2020-12-04 DIAGNOSIS — R519 Headache, unspecified: Secondary | ICD-10-CM | POA: Diagnosis not present

## 2020-12-04 DIAGNOSIS — D631 Anemia in chronic kidney disease: Secondary | ICD-10-CM | POA: Diagnosis not present

## 2020-12-07 DIAGNOSIS — Z4901 Encounter for fitting and adjustment of extracorporeal dialysis catheter: Secondary | ICD-10-CM | POA: Diagnosis not present

## 2020-12-07 DIAGNOSIS — R519 Headache, unspecified: Secondary | ICD-10-CM | POA: Diagnosis not present

## 2020-12-07 DIAGNOSIS — A499 Bacterial infection, unspecified: Secondary | ICD-10-CM | POA: Diagnosis not present

## 2020-12-07 DIAGNOSIS — D689 Coagulation defect, unspecified: Secondary | ICD-10-CM | POA: Diagnosis not present

## 2020-12-07 DIAGNOSIS — D631 Anemia in chronic kidney disease: Secondary | ICD-10-CM | POA: Diagnosis not present

## 2020-12-07 DIAGNOSIS — Z992 Dependence on renal dialysis: Secondary | ICD-10-CM | POA: Diagnosis not present

## 2020-12-07 DIAGNOSIS — N186 End stage renal disease: Secondary | ICD-10-CM | POA: Diagnosis not present

## 2020-12-07 DIAGNOSIS — N2581 Secondary hyperparathyroidism of renal origin: Secondary | ICD-10-CM | POA: Diagnosis not present

## 2020-12-08 ENCOUNTER — Other Ambulatory Visit: Payer: Self-pay

## 2020-12-08 ENCOUNTER — Encounter: Payer: Self-pay | Admitting: Family Medicine

## 2020-12-08 ENCOUNTER — Ambulatory Visit (INDEPENDENT_AMBULATORY_CARE_PROVIDER_SITE_OTHER): Payer: Medicare Other | Admitting: Family Medicine

## 2020-12-08 VITALS — BP 154/87 | HR 84 | Temp 98.3°F

## 2020-12-08 DIAGNOSIS — M1990 Unspecified osteoarthritis, unspecified site: Secondary | ICD-10-CM

## 2020-12-08 DIAGNOSIS — R531 Weakness: Secondary | ICD-10-CM | POA: Diagnosis not present

## 2020-12-08 DIAGNOSIS — Z9181 History of falling: Secondary | ICD-10-CM

## 2020-12-08 DIAGNOSIS — I10 Essential (primary) hypertension: Secondary | ICD-10-CM | POA: Diagnosis not present

## 2020-12-08 DIAGNOSIS — R269 Unspecified abnormalities of gait and mobility: Secondary | ICD-10-CM

## 2020-12-08 DIAGNOSIS — S42122D Displaced fracture of acromial process, left shoulder, subsequent encounter for fracture with routine healing: Secondary | ICD-10-CM | POA: Diagnosis not present

## 2020-12-08 DIAGNOSIS — R2681 Unsteadiness on feet: Secondary | ICD-10-CM

## 2020-12-08 DIAGNOSIS — Z9889 Other specified postprocedural states: Secondary | ICD-10-CM | POA: Diagnosis not present

## 2020-12-08 MED ORDER — HYDRALAZINE HCL 10 MG PO TABS: 10.0000 mg | ORAL_TABLET | Freq: Three times a day (TID) | ORAL | 2 refills | Status: DC

## 2020-12-08 NOTE — Progress Notes (Signed)
Assessment & Plan:  1. Arthritis Power lift chair ordered. - For home use only DME Other see comment  2-5. Unsteadiness/At high risk for falls/Generalized weakness/Gait abnormality Transport wheelchair ordered. - For home use only DME Other see comment  6. Essential hypertension Uncontrolled. Hydralazine increased from BID to TID. - hydrALAZINE (APRESOLINE) 10 MG tablet; Take 1 tablet (10 mg total) by mouth 3 (three) times daily.  Dispense: 90 tablet; Refill: 2   Follow up plan: Return as scheduled.  [name], MSN, APRN, FNP-C [facility]  Subjective:   Patient ID: [name], female    DOB: [birth year], 80 y.o.   MRN: [id number]  HPI: [name] is a 80 y.o. female presenting on 12/08/2020 for PPW for lift chair and wheelchair  Patient is accompanied by her daughter-in-law, who she is okay with being present.  Patient is here today for a face-to-face visit for orders for a power lift chair and transport wheelchair.   Due to generalized weakness, unsteadiness, high risk for falls, and gait abnormality patient uses a transport wheelchair when going out for appointments to prevent falls. She would be unable to propel a manual wheelchair.   She is also requesting a power lift chair for her home to aid her in getting up so that she can maintain her current level of ambulation and prevent further deterioration. 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 Currently a family member has to pull her to standing from every chair in the house, regardless of height. Patient states once they get her up, she is able to slowly ambulate with her walker around the house. She does not use her walker outside of the house due to risk of falling. Patient has tried physical therapy and does take [place] as needed for pain relief.    ROS: Negative unless specifically indicated above in HPI.    Relevant past medical history reviewed and updated as indicated.   Allergies and medications reviewed and updated.   Current Outpatient Medications:    acetaminophen (TYLENOL) 500 MG tablet, Take 2 tablets (1,000 mg total) by mouth every 8 (eight) hours as needed for mild pain or headache., Disp: 30 tablet, Rfl: 0   albuterol (VENTOLIN HFA) 108 (90 Base) MCG/ACT inhaler, Inhale 2 puffs into the lungs every 6 (six) hours as needed for wheezing or shortness of breath., Disp: 18 g, Rfl: 2   alendronate (FOSAMAX) 70 MG tablet, Take 1 tablet (70 mg total) by mouth every Thursday., Disp: 12 tablet, Rfl: 3   AURYXIA 1 GM 210 MG(Fe) tablet, Take 210 mg by mouth 3 (three) times daily with meals., Disp: , Rfl:    budesonide-formoterol (SYMBICORT) 80-4.5 MCG/ACT inhaler, Inhale 2 puffs into the lungs 2 (two) times daily., Disp: 3 each, Rfl: 4   cinacalcet (SENSIPAR) 30 MG tablet, Take 1 tablet (30 mg total) by mouth every Monday, Wednesday, and Friday., Disp: 60 tablet, Rfl: 0   citalopram (CELEXA) 20 MG tablet, Take 1 tablet (20 mg total) by mouth daily., Disp: 30 tablet, Rfl: 2   docusate calcium (SURFAK) 240 MG capsule, Take 1 capsule (240 mg total) by mouth daily., Disp: 90 capsule, Rfl: 1   doxercalciferol (HECTOROL) 4 MCG/2ML injection, Inject 0.5 mLs (1 mcg total) into the vein every Monday, Wednesday, and Friday with hemodialysis., Disp: 2 mL, Rfl: 0   ezetimibe (ZETIA) 10 MG tablet, Take 1 tablet (10  mg total) by mouth daily., Disp: 90 tablet, Rfl: 3   famotidine (PEPCID) 20 MG tablet, Take 1 tablet (20 mg total) by mouth 2 (two) times daily., Disp: 180 tablet, Rfl: 1   hydrALAZINE (APRESOLINE) 10 MG tablet, Take 1 tablet (10 mg total) by mouth in the morning and at bedtime., Disp: 60 tablet, Rfl: 11   [START ON 01/18/2021] HYDROcodone-acetaminophen (NORCO/VICODIN) 5-325 MG tablet, Take 1 tablet by mouth every 6 (six) hours as needed for moderate pain., Disp: 60 tablet, Rfl: 0   [START ON  12/19/2020] HYDROcodone-acetaminophen (NORCO/VICODIN) 5-325 MG tablet, Take 1 tablet by mouth every 6 (six) hours as needed for moderate pain., Disp: 60 tablet, Rfl: 0   HYDROcodone-acetaminophen (NORCO/VICODIN) 5-325 MG tablet, Take 1 tablet by mouth every 6 (six) hours as needed for moderate pain., Disp: 60 tablet, Rfl: 0   levocetirizine (XYZAL) 5 MG tablet, TAKE 1 TABLET BY MOUTH EVERY DAY IN THE EVENING, Disp: 90 tablet, Rfl: 1   levothyroxine (SYNTHROID) 112 MCG tablet, Take 1 tablet (112 mcg total) by mouth daily., Disp: 90 tablet, Rfl: 1   multivitamin (RENA-VIT) TABS tablet, Take 1 tablet by mouth daily., Disp: , Rfl:    nystatin cream (MYCOSTATIN), Apply 1 application topically 2 (two) times daily as needed for dry skin., Disp: 30 g, Rfl: 2   pantoprazole (PROTONIX) 20 MG tablet, Take 1 tablet (20 mg total) by mouth daily., Disp: 90 tablet, Rfl: 1   pramipexole (MIRAPEX) 0.125 MG tablet, Take 1 tablet (0.125 mg total) by mouth at bedtime., Disp: 90 tablet, Rfl: 1   Vitamin D, Ergocalciferol, (DRISDOL) 1.25 MG (50000 UNIT) CAPS capsule, Take 1 capsule (50,000 Units total) by mouth every Monday., Disp: 12 capsule, Rfl: 3  Allergies  Allergen Reactions   Amoxicillin Rash    Has patient had a PCN reaction causing immediate rash, facial/tongue/throat swelling, SOB or lightheadedness with hypotension:YES Has patient had a PCN reaction causing severe rash involving mucus membranes or skin necrosis: Yes Has patient had a PCN reaction that required hospitalization No Has patient had a PCN reaction occurring within the last 10 years: Yes If all of the above answers are "NO", then may proceed with Cephalosporin use.    Elemental Sulfur Hives   Sulfur Hives   Peg 3350-Electrolytes Other (See Comments)   Sulfa Drugs Cross Reactors Hives and Itching   Miralax [Polyethylene Glycol] Itching   Mixed Grasses    Percocet [Oxycodone-Acetaminophen] Itching and Rash    Did not happen last time she took  it   Sulfa Antibiotics Hives    Objective:   BP (!) 154/87   Pulse 84   Temp 98.3 F (36.8 C) (Temporal)   SpO2 95%    Physical Exam Vitals reviewed.  Constitutional:      General: She is not in acute distress.    Appearance: Normal appearance. She is not ill-appearing, toxic-appearing or diaphoretic.  HENT:     Head: Normocephalic and atraumatic.  Eyes:     General: No scleral icterus.       Right eye: No discharge.        Left eye: No discharge.     Conjunctiva/sclera: Conjunctivae normal.  Cardiovascular:     Rate and Rhythm: Normal rate and regular rhythm.     Heart sounds: Normal heart sounds. No murmur heard.   No friction rub. No gallop.  Pulmonary:     Effort: Pulmonary effort is normal. No respiratory distress.     Breath sounds: Normal  breath sounds. No stridor. No wheezing, rhonchi or rales.  Musculoskeletal:        General: Normal range of motion.     Cervical back: Normal range of motion.  Skin:    General: Skin is warm and dry.     Capillary Refill: Capillary refill takes less than 2 seconds.  Neurological:     General: No focal deficit present.     Mental Status: She is alert and oriented to person, place, and time. Mental status is at baseline.     Motor: Weakness present.     Gait: Gait abnormal (riding in [employer]).  Psychiatric:        Mood and Affect: Mood normal.        Behavior: Behavior normal.        Thought Content: Thought content normal.        Judgment: Judgment normal.

## 2020-12-09 DIAGNOSIS — N186 End stage renal disease: Secondary | ICD-10-CM | POA: Diagnosis not present

## 2020-12-09 DIAGNOSIS — D631 Anemia in chronic kidney disease: Secondary | ICD-10-CM | POA: Diagnosis not present

## 2020-12-09 DIAGNOSIS — N2581 Secondary hyperparathyroidism of renal origin: Secondary | ICD-10-CM | POA: Diagnosis not present

## 2020-12-09 DIAGNOSIS — Z992 Dependence on renal dialysis: Secondary | ICD-10-CM | POA: Diagnosis not present

## 2020-12-09 DIAGNOSIS — Z4901 Encounter for fitting and adjustment of extracorporeal dialysis catheter: Secondary | ICD-10-CM | POA: Diagnosis not present

## 2020-12-09 DIAGNOSIS — A499 Bacterial infection, unspecified: Secondary | ICD-10-CM | POA: Diagnosis not present

## 2020-12-09 DIAGNOSIS — D689 Coagulation defect, unspecified: Secondary | ICD-10-CM | POA: Diagnosis not present

## 2020-12-09 DIAGNOSIS — R519 Headache, unspecified: Secondary | ICD-10-CM | POA: Diagnosis not present

## 2020-12-11 DIAGNOSIS — D689 Coagulation defect, unspecified: Secondary | ICD-10-CM | POA: Diagnosis not present

## 2020-12-11 DIAGNOSIS — D631 Anemia in chronic kidney disease: Secondary | ICD-10-CM | POA: Diagnosis not present

## 2020-12-11 DIAGNOSIS — A499 Bacterial infection, unspecified: Secondary | ICD-10-CM | POA: Diagnosis not present

## 2020-12-11 DIAGNOSIS — Z992 Dependence on renal dialysis: Secondary | ICD-10-CM | POA: Diagnosis not present

## 2020-12-11 DIAGNOSIS — Z4901 Encounter for fitting and adjustment of extracorporeal dialysis catheter: Secondary | ICD-10-CM | POA: Diagnosis not present

## 2020-12-11 DIAGNOSIS — R519 Headache, unspecified: Secondary | ICD-10-CM | POA: Diagnosis not present

## 2020-12-11 DIAGNOSIS — N2581 Secondary hyperparathyroidism of renal origin: Secondary | ICD-10-CM | POA: Diagnosis not present

## 2020-12-11 DIAGNOSIS — N186 End stage renal disease: Secondary | ICD-10-CM | POA: Diagnosis not present

## 2020-12-12 DIAGNOSIS — N186 End stage renal disease: Secondary | ICD-10-CM | POA: Diagnosis not present

## 2020-12-12 DIAGNOSIS — Z992 Dependence on renal dialysis: Secondary | ICD-10-CM | POA: Diagnosis not present

## 2020-12-12 DIAGNOSIS — A499 Bacterial infection, unspecified: Secondary | ICD-10-CM | POA: Diagnosis not present

## 2020-12-12 DIAGNOSIS — N2581 Secondary hyperparathyroidism of renal origin: Secondary | ICD-10-CM | POA: Diagnosis not present

## 2020-12-12 DIAGNOSIS — D689 Coagulation defect, unspecified: Secondary | ICD-10-CM | POA: Diagnosis not present

## 2020-12-12 DIAGNOSIS — R519 Headache, unspecified: Secondary | ICD-10-CM | POA: Diagnosis not present

## 2020-12-12 DIAGNOSIS — D631 Anemia in chronic kidney disease: Secondary | ICD-10-CM | POA: Diagnosis not present

## 2020-12-12 DIAGNOSIS — Z4901 Encounter for fitting and adjustment of extracorporeal dialysis catheter: Secondary | ICD-10-CM | POA: Diagnosis not present

## 2020-12-14 ENCOUNTER — Encounter: Payer: Self-pay | Admitting: Family Medicine

## 2020-12-14 DIAGNOSIS — A499 Bacterial infection, unspecified: Secondary | ICD-10-CM | POA: Diagnosis not present

## 2020-12-14 DIAGNOSIS — Z4901 Encounter for fitting and adjustment of extracorporeal dialysis catheter: Secondary | ICD-10-CM | POA: Diagnosis not present

## 2020-12-14 DIAGNOSIS — N2581 Secondary hyperparathyroidism of renal origin: Secondary | ICD-10-CM | POA: Diagnosis not present

## 2020-12-14 DIAGNOSIS — N186 End stage renal disease: Secondary | ICD-10-CM | POA: Diagnosis not present

## 2020-12-14 DIAGNOSIS — R519 Headache, unspecified: Secondary | ICD-10-CM | POA: Diagnosis not present

## 2020-12-14 DIAGNOSIS — D689 Coagulation defect, unspecified: Secondary | ICD-10-CM | POA: Diagnosis not present

## 2020-12-14 DIAGNOSIS — Z992 Dependence on renal dialysis: Secondary | ICD-10-CM | POA: Diagnosis not present

## 2020-12-14 DIAGNOSIS — D631 Anemia in chronic kidney disease: Secondary | ICD-10-CM | POA: Diagnosis not present

## 2020-12-16 DIAGNOSIS — D631 Anemia in chronic kidney disease: Secondary | ICD-10-CM | POA: Diagnosis not present

## 2020-12-16 DIAGNOSIS — D689 Coagulation defect, unspecified: Secondary | ICD-10-CM | POA: Diagnosis not present

## 2020-12-16 DIAGNOSIS — N2581 Secondary hyperparathyroidism of renal origin: Secondary | ICD-10-CM | POA: Diagnosis not present

## 2020-12-16 DIAGNOSIS — Z992 Dependence on renal dialysis: Secondary | ICD-10-CM | POA: Diagnosis not present

## 2020-12-16 DIAGNOSIS — N186 End stage renal disease: Secondary | ICD-10-CM | POA: Diagnosis not present

## 2020-12-16 DIAGNOSIS — R519 Headache, unspecified: Secondary | ICD-10-CM | POA: Diagnosis not present

## 2020-12-16 DIAGNOSIS — A499 Bacterial infection, unspecified: Secondary | ICD-10-CM | POA: Diagnosis not present

## 2020-12-16 DIAGNOSIS — Z4901 Encounter for fitting and adjustment of extracorporeal dialysis catheter: Secondary | ICD-10-CM | POA: Diagnosis not present

## 2020-12-17 ENCOUNTER — Other Ambulatory Visit: Payer: Self-pay | Admitting: Family Medicine

## 2020-12-18 DIAGNOSIS — A499 Bacterial infection, unspecified: Secondary | ICD-10-CM | POA: Diagnosis not present

## 2020-12-18 DIAGNOSIS — N2581 Secondary hyperparathyroidism of renal origin: Secondary | ICD-10-CM | POA: Diagnosis not present

## 2020-12-18 DIAGNOSIS — Z4901 Encounter for fitting and adjustment of extracorporeal dialysis catheter: Secondary | ICD-10-CM | POA: Diagnosis not present

## 2020-12-18 DIAGNOSIS — N186 End stage renal disease: Secondary | ICD-10-CM | POA: Diagnosis not present

## 2020-12-18 DIAGNOSIS — R519 Headache, unspecified: Secondary | ICD-10-CM | POA: Diagnosis not present

## 2020-12-18 DIAGNOSIS — D689 Coagulation defect, unspecified: Secondary | ICD-10-CM | POA: Diagnosis not present

## 2020-12-18 DIAGNOSIS — Z992 Dependence on renal dialysis: Secondary | ICD-10-CM | POA: Diagnosis not present

## 2020-12-18 DIAGNOSIS — D631 Anemia in chronic kidney disease: Secondary | ICD-10-CM | POA: Diagnosis not present

## 2020-12-21 DIAGNOSIS — A499 Bacterial infection, unspecified: Secondary | ICD-10-CM | POA: Diagnosis not present

## 2020-12-21 DIAGNOSIS — N186 End stage renal disease: Secondary | ICD-10-CM | POA: Diagnosis not present

## 2020-12-21 DIAGNOSIS — N2581 Secondary hyperparathyroidism of renal origin: Secondary | ICD-10-CM | POA: Diagnosis not present

## 2020-12-21 DIAGNOSIS — Z4901 Encounter for fitting and adjustment of extracorporeal dialysis catheter: Secondary | ICD-10-CM | POA: Diagnosis not present

## 2020-12-21 DIAGNOSIS — D689 Coagulation defect, unspecified: Secondary | ICD-10-CM | POA: Diagnosis not present

## 2020-12-21 DIAGNOSIS — Z992 Dependence on renal dialysis: Secondary | ICD-10-CM | POA: Diagnosis not present

## 2020-12-21 DIAGNOSIS — R519 Headache, unspecified: Secondary | ICD-10-CM | POA: Diagnosis not present

## 2020-12-21 DIAGNOSIS — D631 Anemia in chronic kidney disease: Secondary | ICD-10-CM | POA: Diagnosis not present

## 2020-12-23 DIAGNOSIS — D689 Coagulation defect, unspecified: Secondary | ICD-10-CM | POA: Diagnosis not present

## 2020-12-23 DIAGNOSIS — A499 Bacterial infection, unspecified: Secondary | ICD-10-CM | POA: Diagnosis not present

## 2020-12-23 DIAGNOSIS — D631 Anemia in chronic kidney disease: Secondary | ICD-10-CM | POA: Diagnosis not present

## 2020-12-23 DIAGNOSIS — N186 End stage renal disease: Secondary | ICD-10-CM | POA: Diagnosis not present

## 2020-12-23 DIAGNOSIS — R519 Headache, unspecified: Secondary | ICD-10-CM | POA: Diagnosis not present

## 2020-12-23 DIAGNOSIS — N2581 Secondary hyperparathyroidism of renal origin: Secondary | ICD-10-CM | POA: Diagnosis not present

## 2020-12-23 DIAGNOSIS — Z992 Dependence on renal dialysis: Secondary | ICD-10-CM | POA: Diagnosis not present

## 2020-12-23 DIAGNOSIS — Z4901 Encounter for fitting and adjustment of extracorporeal dialysis catheter: Secondary | ICD-10-CM | POA: Diagnosis not present

## 2020-12-25 DIAGNOSIS — M1990 Unspecified osteoarthritis, unspecified site: Secondary | ICD-10-CM | POA: Diagnosis not present

## 2020-12-25 DIAGNOSIS — N2581 Secondary hyperparathyroidism of renal origin: Secondary | ICD-10-CM | POA: Diagnosis not present

## 2020-12-25 DIAGNOSIS — J9621 Acute and chronic respiratory failure with hypoxia: Secondary | ICD-10-CM | POA: Diagnosis not present

## 2020-12-25 DIAGNOSIS — Z992 Dependence on renal dialysis: Secondary | ICD-10-CM | POA: Diagnosis not present

## 2020-12-25 DIAGNOSIS — N185 Chronic kidney disease, stage 5: Secondary | ICD-10-CM | POA: Diagnosis not present

## 2020-12-25 DIAGNOSIS — I480 Paroxysmal atrial fibrillation: Secondary | ICD-10-CM | POA: Diagnosis not present

## 2020-12-25 DIAGNOSIS — N186 End stage renal disease: Secondary | ICD-10-CM | POA: Diagnosis not present

## 2020-12-25 DIAGNOSIS — R519 Headache, unspecified: Secondary | ICD-10-CM | POA: Diagnosis not present

## 2020-12-25 DIAGNOSIS — A499 Bacterial infection, unspecified: Secondary | ICD-10-CM | POA: Diagnosis not present

## 2020-12-25 DIAGNOSIS — D689 Coagulation defect, unspecified: Secondary | ICD-10-CM | POA: Diagnosis not present

## 2020-12-25 DIAGNOSIS — D631 Anemia in chronic kidney disease: Secondary | ICD-10-CM | POA: Diagnosis not present

## 2020-12-25 DIAGNOSIS — Z4901 Encounter for fitting and adjustment of extracorporeal dialysis catheter: Secondary | ICD-10-CM | POA: Diagnosis not present

## 2020-12-28 DIAGNOSIS — Z4901 Encounter for fitting and adjustment of extracorporeal dialysis catheter: Secondary | ICD-10-CM | POA: Diagnosis not present

## 2020-12-28 DIAGNOSIS — I480 Paroxysmal atrial fibrillation: Secondary | ICD-10-CM | POA: Diagnosis not present

## 2020-12-28 DIAGNOSIS — D689 Coagulation defect, unspecified: Secondary | ICD-10-CM | POA: Diagnosis not present

## 2020-12-28 DIAGNOSIS — R519 Headache, unspecified: Secondary | ICD-10-CM | POA: Diagnosis not present

## 2020-12-28 DIAGNOSIS — Z992 Dependence on renal dialysis: Secondary | ICD-10-CM | POA: Diagnosis not present

## 2020-12-28 DIAGNOSIS — D631 Anemia in chronic kidney disease: Secondary | ICD-10-CM | POA: Diagnosis not present

## 2020-12-28 DIAGNOSIS — A499 Bacterial infection, unspecified: Secondary | ICD-10-CM | POA: Diagnosis not present

## 2020-12-28 DIAGNOSIS — N186 End stage renal disease: Secondary | ICD-10-CM | POA: Diagnosis not present

## 2020-12-28 DIAGNOSIS — N2581 Secondary hyperparathyroidism of renal origin: Secondary | ICD-10-CM | POA: Diagnosis not present

## 2020-12-29 DIAGNOSIS — Z96612 Presence of left artificial shoulder joint: Secondary | ICD-10-CM | POA: Diagnosis not present

## 2020-12-29 DIAGNOSIS — M25512 Pain in left shoulder: Secondary | ICD-10-CM | POA: Diagnosis not present

## 2020-12-29 DIAGNOSIS — Z96611 Presence of right artificial shoulder joint: Secondary | ICD-10-CM | POA: Diagnosis not present

## 2020-12-29 DIAGNOSIS — M25522 Pain in left elbow: Secondary | ICD-10-CM | POA: Diagnosis not present

## 2020-12-29 DIAGNOSIS — M25511 Pain in right shoulder: Secondary | ICD-10-CM | POA: Diagnosis not present

## 2020-12-30 DIAGNOSIS — Z4901 Encounter for fitting and adjustment of extracorporeal dialysis catheter: Secondary | ICD-10-CM | POA: Diagnosis not present

## 2020-12-30 DIAGNOSIS — A499 Bacterial infection, unspecified: Secondary | ICD-10-CM | POA: Diagnosis not present

## 2020-12-30 DIAGNOSIS — D689 Coagulation defect, unspecified: Secondary | ICD-10-CM | POA: Diagnosis not present

## 2020-12-30 DIAGNOSIS — N186 End stage renal disease: Secondary | ICD-10-CM | POA: Diagnosis not present

## 2020-12-30 DIAGNOSIS — D631 Anemia in chronic kidney disease: Secondary | ICD-10-CM | POA: Diagnosis not present

## 2020-12-30 DIAGNOSIS — Z992 Dependence on renal dialysis: Secondary | ICD-10-CM | POA: Diagnosis not present

## 2020-12-30 DIAGNOSIS — R519 Headache, unspecified: Secondary | ICD-10-CM | POA: Diagnosis not present

## 2020-12-30 DIAGNOSIS — N2581 Secondary hyperparathyroidism of renal origin: Secondary | ICD-10-CM | POA: Diagnosis not present

## 2021-01-01 ENCOUNTER — Encounter: Payer: Self-pay | Admitting: Nurse Practitioner

## 2021-01-01 ENCOUNTER — Ambulatory Visit (INDEPENDENT_AMBULATORY_CARE_PROVIDER_SITE_OTHER): Payer: Medicare Other | Admitting: Nurse Practitioner

## 2021-01-01 ENCOUNTER — Other Ambulatory Visit: Payer: Self-pay

## 2021-01-01 VITALS — BP 171/91 | HR 81 | Temp 97.2°F

## 2021-01-01 DIAGNOSIS — D689 Coagulation defect, unspecified: Secondary | ICD-10-CM | POA: Diagnosis not present

## 2021-01-01 DIAGNOSIS — Z992 Dependence on renal dialysis: Secondary | ICD-10-CM | POA: Diagnosis not present

## 2021-01-01 DIAGNOSIS — D631 Anemia in chronic kidney disease: Secondary | ICD-10-CM | POA: Diagnosis not present

## 2021-01-01 DIAGNOSIS — N2581 Secondary hyperparathyroidism of renal origin: Secondary | ICD-10-CM | POA: Diagnosis not present

## 2021-01-01 DIAGNOSIS — N186 End stage renal disease: Secondary | ICD-10-CM | POA: Diagnosis not present

## 2021-01-01 DIAGNOSIS — E1122 Type 2 diabetes mellitus with diabetic chronic kidney disease: Secondary | ICD-10-CM | POA: Diagnosis not present

## 2021-01-01 DIAGNOSIS — A499 Bacterial infection, unspecified: Secondary | ICD-10-CM | POA: Diagnosis not present

## 2021-01-01 DIAGNOSIS — Z4901 Encounter for fitting and adjustment of extracorporeal dialysis catheter: Secondary | ICD-10-CM | POA: Diagnosis not present

## 2021-01-01 DIAGNOSIS — R519 Headache, unspecified: Secondary | ICD-10-CM | POA: Diagnosis not present

## 2021-01-01 DIAGNOSIS — R21 Rash and other nonspecific skin eruption: Secondary | ICD-10-CM | POA: Diagnosis not present

## 2021-01-01 MED ORDER — MUPIROCIN CALCIUM 2 % EX CREA: 1.0000 "application " | TOPICAL_CREAM | Freq: Two times a day (BID) | CUTANEOUS | 0 refills | Status: DC

## 2021-01-01 NOTE — Progress Notes (Signed)
Acute Office Visit  Subjective:    Patient ID: [name], female    DOB: 1940/11/20, 80 y.o.   MRN: [id number]  Chief Complaint  Patient presents with   blisters    Patient has blisters on her right hand that was noticed yesterday that is painful.     Rash This is a new problem. The current episode started in the past 7 days. The problem is unchanged. Location: Right hand palm. The rash is characterized by dryness, pain and redness. It is unknown if there was an exposure to a precipitant. Pertinent negatives include no congestion, cough, fever or sore throat. Past treatments include nothing.    Past Medical History:  Diagnosis Date   Acute respiratory failure ([place]) 05/2017   Anemia    Anxiety    Arthritis    COPD (chronic obstructive pulmonary disease) (HCC)    Depression    ESRD (end stage renal disease) (HCC)    dialysis MWF   GERD (gastroesophageal reflux disease)    Gout    History of blood transfusion    History of diabetes mellitus    "diet controlled"   HLD (hyperlipidemia)    HOH (hard of hearing)    left ear   Hypertension    hypotensive -since starting dialysis   Hypothyroidism    MRSA (methicillin resistant staph aureus) culture positive 06/01/2017   PAF (paroxysmal atrial fibrillation) ([place])    a. Echo 11/16:  Mild LVH, EF 55-60%, normal wall motion, MAC, mild MR, severe LAE (49 ml/m2), mild RVE, normal RVSF, mild RAE, mild TR, PASP 24 mmHg;  CHADS2-VASc: 4 >> Coumadin followed by PCP    Past Surgical History:  Procedure Laterality Date   AV FISTULA PLACEMENT  04/03/2012   Procedure: ARTERIOVENOUS (AV) FISTULA CREATION;  Surgeon: Elam Dutch, MD;  Location: [place];  Service: Vascular;  Laterality: Left;  creation left brachial cephalic fistula    BIOPSY  08/21/2018   Procedure: BIOPSY;  Surgeon: Thornton Park, MD;  Location: [place];  Service: Gastroenterology;;   CARPAL TUNNEL RELEASE Bilateral    COLONOSCOPY W/ POLYPECTOMY     COLONOSCOPY  WITH PROPOFOL N/A 08/21/2018   Procedure: COLONOSCOPY WITH PROPOFOL;  Surgeon: Thornton Park, MD;  Location: [place];  Service: Gastroenterology;  Laterality: N/A;   DILATION AND CURETTAGE OF UTERUS     ESOPHAGOGASTRODUODENOSCOPY (EGD) WITH PROPOFOL N/A 08/21/2018   Procedure: ESOPHAGOGASTRODUODENOSCOPY (EGD) WITH PROPOFOL;  Surgeon: Thornton Park, MD;  Location: [place];  Service: Gastroenterology;  Laterality: N/A;   EYE SURGERY Bilateral    bilateral cataract removal   HEMATOMA EVACUATION Left 05/14/2018   Procedure: Incision and Drainage of Left Arm Hematoma;  Surgeon: Elam Dutch, MD;  Location: [place];  Service: Vascular;  Laterality: Left;   Hemodialysis  catheter Right    IR FLUORO GUIDE CV LINE RIGHT  05/26/2017   IR REMOVAL TUN CV CATH W/O FL  05/24/2017   IR US GUIDE VASC ACCESS RIGHT  05/26/2017   LIGATION OF ARTERIOVENOUS  FISTULA Left 02/04/2015   Procedure: LIGATION OF BRACHIOCEPHALIC ARTERIOVENOUS  FISTULA;  Surgeon: Conrad Terry, MD;  Location: [place];  Service: Vascular;  Laterality: Left;   MULTIPLE TOOTH EXTRACTIONS     PARTIAL HIP ARTHROPLASTY     POLYPECTOMY  08/21/2018   Procedure: POLYPECTOMY;  Surgeon: Thornton Park, MD;  Location: [place];  Service: Gastroenterology;;   PORTACATH PLACEMENT     REVERSE SHOULDER ARTHROPLASTY Left 01/22/2016   Procedure:  LEFT REVERSE SHOULDER ARTHROPLASTY;  Surgeon: Netta Cedars, MD;  Location: [place];  Service: Orthopedics;  Laterality: Left;   SHOULDER ARTHROSCOPY Bilateral    SPLIT NIGHT STUDY  07/26/2015   STERIOD INJECTION Right 01/22/2016   Procedure: RIGHT RING FINGER STEROID INJECTION;  Surgeon: Netta Cedars, MD;  Location: [place];  Service: Orthopedics;  Laterality: Right;   TEE WITHOUT CARDIOVERSION N/A 05/26/2017   Procedure: TRANSESOPHAGEAL ECHOCARDIOGRAM (TEE);  Surgeon: Lelon Perla, MD;  Location: [facility] ENDOSCOPY;  Service: Cardiovascular;  Laterality: N/A;   THROMBECTOMY BRACHIAL ARTERY Left  02/06/2015   Procedure: EVACUATION OF LEFT ARM HEMATOMA;  Surgeon: Angelia Mould, MD;  Location: [facility] OR;  Service: Vascular;  Laterality: Left;   TOTAL KNEE ARTHROPLASTY Bilateral    TUBAL LIGATION      Family History  Problem Relation Age of Onset   Arthritis Mother    Diabetes Father        before age 64   Heart disease Father    Diabetes Sister    Cancer Brother    Hyperlipidemia Daughter    Hypertension Daughter    Diabetes Daughter    Hypertension Son    Hyperlipidemia Son    Dementia Sister    Cancer Sister        Unknown   Other Sister        Died in car accident   Stroke Brother    Diabetes Daughter    Hypertension Daughter    Hyperlipidemia Daughter    Hepatitis C Son     Social History   Socioeconomic History   Marital status: Widowed    Spouse name: Not on file   Number of children: 6   Years of education: 10   Highest education level: 10th grade  Occupational History   Occupation: RETIRED  Tobacco Use   Smoking status: Former    Packs/day: 0.25    Years: 2.00    Pack years: 0.50    Types: Cigarettes    Quit date: 04/04/1998    Years since quitting: 22.7   Smokeless tobacco: Never  Vaping Use   Vaping Use: Never used  Substance and Sexual Activity   Alcohol use: No    Alcohol/week: 0.0 standard drinks   Drug use: No   Sexual activity: Not Currently    Birth control/protection: None  Other Topics Concern   Not on file  Social History Narrative   ** Merged History Encounter ** Widowed   6 children   Lives with son and daughter in Probation [place]   ESRD - dialysis 3x per week   Cognitive impairment   Social Determinants of Health   Financial Resource Strain: Low Risk    Difficulty of Paying Living Expenses: Not hard at all  Food Insecurity: No Food Insecurity   Worried About Charity fundraiser in the Last Year: Never true   Arboriculturist in the Last Year: Never true  Transportation Needs: No Transportation Needs   Lack of  Transportation (Medical): No   Lack of Transportation (Non-Medical): No  Physical Activity: Inactive   Days of Exercise per Week: 0 days   Minutes of Exercise per Session: 0 min  Stress: No Stress Concern Present   Feeling of Stress : Only a little  Social Connections: Moderately Isolated   Frequency of Communication with Friends and Family: More than three times a week   Frequency of Social Gatherings with Friends and Family: More than three times  a week   Attends Religious Services: 1 to 4 times per year   Active Member of Clubs or Organizations: No   Attends Archivist Meetings: Never   Marital Status: Widowed  Human resources officer Violence: Not At Risk   Fear of Current or Ex-Partner: No   Emotionally Abused: No   Physically Abused: No   Sexually Abused: No    Outpatient Medications Prior to Visit  Medication Sig Dispense Refill   acetaminophen (TYLENOL) 500 MG tablet Take 2 tablets (1,000 mg total) by mouth every 8 (eight) hours as needed for mild pain or headache. 30 tablet 0   albuterol (VENTOLIN HFA) 108 (90 Base) MCG/ACT inhaler Inhale 2 puffs into the lungs every 6 (six) hours as needed for wheezing or shortness of breath. 18 g 2   alendronate (FOSAMAX) 70 MG tablet Take 1 tablet (70 mg total) by mouth every Thursday. 12 tablet 3   AURYXIA 1 GM 210 MG(Fe) tablet Take 210 mg by mouth 3 (three) times daily with meals.     budesonide-formoterol (SYMBICORT) 80-4.5 MCG/ACT inhaler Inhale 2 puffs into the lungs 2 (two) times daily. 3 each 4   cinacalcet (SENSIPAR) 30 MG tablet Take 1 tablet (30 mg total) by mouth every Monday, Wednesday, and Friday. 60 tablet 0   citalopram (CELEXA) 20 MG tablet TAKE 1 TABLET BY MOUTH EVERY DAY 90 tablet 0   docusate calcium (SURFAK) 240 MG capsule Take 1 capsule (240 mg total) by mouth daily. 90 capsule 1   doxercalciferol (HECTOROL) 4 MCG/2ML injection Inject 0.5 mLs (1 mcg total) into the vein every Monday, Wednesday, and Friday with  hemodialysis. 2 mL 0   ezetimibe (ZETIA) 10 MG tablet Take 1 tablet (10 mg total) by mouth daily. 90 tablet 3   famotidine (PEPCID) 20 MG tablet Take 1 tablet (20 mg total) by mouth 2 (two) times daily. 180 tablet 1   hydrALAZINE (APRESOLINE) 10 MG tablet Take 1 tablet (10 mg total) by mouth 3 (three) times daily. 90 tablet 2   [START ON 01/18/2021] HYDROcodone-acetaminophen (NORCO/VICODIN) 5-325 MG tablet Take 1 tablet by mouth every 6 (six) hours as needed for moderate pain. 60 tablet 0   HYDROcodone-acetaminophen (NORCO/VICODIN) 5-325 MG tablet Take 1 tablet by mouth every 6 (six) hours as needed for moderate pain. 60 tablet 0   HYDROcodone-acetaminophen (NORCO/VICODIN) 5-325 MG tablet Take 1 tablet by mouth every 6 (six) hours as needed for moderate pain. 60 tablet 0   levocetirizine (XYZAL) 5 MG tablet TAKE 1 TABLET BY MOUTH EVERY DAY IN THE EVENING 90 tablet 1   levothyroxine (SYNTHROID) 112 MCG tablet Take 1 tablet (112 mcg total) by mouth daily. 90 tablet 1   multivitamin (RENA-VIT) TABS tablet Take 1 tablet by mouth daily.     nystatin cream (MYCOSTATIN) Apply 1 application topically 2 (two) times daily as needed for dry skin. 30 g 2   pantoprazole (PROTONIX) 20 MG tablet Take 1 tablet (20 mg total) by mouth daily. 90 tablet 1   pramipexole (MIRAPEX) 0.125 MG tablet Take 1 tablet (0.125 mg total) by mouth at bedtime. 90 tablet 1   Vitamin D, Ergocalciferol, (DRISDOL) 1.25 MG (50000 UNIT) CAPS capsule Take 1 capsule (50,000 Units total) by mouth every Monday. 12 capsule 3   No facility-administered medications prior to visit.    Allergies  Allergen Reactions   Amoxicillin Rash    Has patient had a PCN reaction causing immediate rash, facial/tongue/throat swelling, SOB or lightheadedness with hypotension:YES  Has patient had a PCN reaction causing severe rash involving mucus membranes or skin necrosis: Yes Has patient had a PCN reaction that required hospitalization No Has patient had a  PCN reaction occurring within the last 10 years: Yes If all of the above answers are "NO", then may proceed with Cephalosporin use.    Elemental Sulfur Hives   Sulfur Hives   Peg 3350-Electrolytes Other (See Comments)   Sulfa Drugs Cross Reactors Hives and Itching   Miralax [Polyethylene Glycol] Itching   Mixed Grasses    Percocet [Oxycodone-Acetaminophen] Itching and Rash    Did not happen last time she took it   Sulfa Antibiotics Hives    Review of Systems  Constitutional:  Negative for fever.  HENT:  Negative for congestion and sore throat.   Respiratory:  Negative for cough.   Genitourinary: Negative.   Skin:  Positive for rash.  All other systems reviewed and are negative.     Objective:    Physical Exam Vitals and nursing note reviewed.  Constitutional:      Appearance: Normal appearance.  HENT:     Head: Normocephalic.     Right Ear: Ear canal and external ear normal.     Nose: Nose normal.     Mouth/Throat:     Mouth: Mucous membranes are moist.     Pharynx: Oropharynx is clear.  Eyes:     Conjunctiva/sclera: Conjunctivae normal.  Cardiovascular:     Rate and Rhythm: Normal rate and regular rhythm.     Pulses: Normal pulses.     Heart sounds: Normal heart sounds.  Pulmonary:     Effort: Pulmonary effort is normal.     Breath sounds: Normal breath sounds.  Abdominal:     General: Bowel sounds are normal.  Musculoskeletal:     Cervical back: Normal range of motion and neck supple.  Skin:    General: Skin is warm.     Findings: Rash present.  Neurological:     Mental Status: She is alert and oriented to person, place, and time.  Psychiatric:        Behavior: Behavior normal.    BP (!) 171/91   Pulse 81   Temp (!) 97.2 F (36.2 C) (Temporal)  Wt Readings from Last 3 Encounters:  12/01/20 130 lb (59 kg)  12/01/20 129 lb (58.5 kg)  11/19/20 129 lb 12.8 oz (58.9 kg)    Health Maintenance Due  Topic Date Due   FOOT EXAM  Never done   HEMOGLOBIN  A1C  11/22/2017   OPHTHALMOLOGY EXAM  01/08/2020   COVID-19 Vaccine (4 - Booster for Moderna series) 06/18/2020    There are no preventive care reminders to display for this patient.   Lab Results  Component Value Date   TSH 4.470 08/18/2020   Lab Results  Component Value Date   WBC 8.2 08/18/2020   HGB 11.8 08/18/2020   HCT 36.8 08/18/2020   MCV 91 08/18/2020   PLT 305 08/18/2020   Lab Results  Component Value Date   NA 145 (H) 08/18/2020   K 5.7 (H) 08/18/2020   CO2 22 08/18/2020   GLUCOSE 74 08/18/2020   BUN 35 (H) 08/18/2020   CREATININE 5.32 (HH) 08/18/2020   BILITOT 0.2 08/18/2020   ALKPHOS 93 08/18/2020   AST 20 08/18/2020   ALT 13 08/18/2020   PROT 6.5 08/18/2020   ALBUMIN 3.6 (L) 08/18/2020   CALCIUM 9.6 08/18/2020   ANIONGAP 10 07/17/2020   EGFR 8 (  L) 08/18/2020   Lab Results  Component Value Date   CHOL 149 05/19/2020   Lab Results  Component Value Date   HDL 77 05/19/2020   Lab Results  Component Value Date   LDLCALC 59 05/19/2020   Lab Results  Component Value Date   TRIG 66 05/19/2020   Lab Results  Component Value Date   CHOLHDL 1.9 05/19/2020   Lab Results  Component Value Date   HGBA1C 4.5 (L) 05/25/2017       Assessment & Plan:   Problem List Items Addressed This Visit       Musculoskeletal and Integument   Rash of hand - Primary    Recent antibiotic treatment with vancomycin for MRSA infection.  Patient developed a rash but not quite sure if that is a side effect from antibiotic treatment. On assessment rash presents like impetigo. -Mupirocin topical cream -keep hands clean and dry -Follow-up with worsening unresolved symptoms.  Education provided to patient printed handouts given.  Rx sent to pharmacy.      Relevant Medications   mupirocin cream (BACTROBAN) 2 %     Meds ordered this encounter  Medications   mupirocin cream (BACTROBAN) 2 %    Sig: Apply 1 application topically 2 (two) times daily.    Dispense:   15 g    Refill:  0    Order Specific Question:   Supervising Provider    Answer:   Janora Norlander [[id number]]      Ivy Lynn, NP

## 2021-01-01 NOTE — Assessment & Plan Note (Signed)
Recent antibiotic treatment with vancomycin for MRSA infection.  Patient developed a rash but not quite sure if that is a side effect from antibiotic treatment. On assessment rash presents like impetigo. -Mupirocin topical cream -keep hands clean and dry -Follow-up with worsening unresolved symptoms.  Education provided to patient printed handouts given.  Rx sent to pharmacy.

## 2021-01-04 DIAGNOSIS — N185 Chronic kidney disease, stage 5: Secondary | ICD-10-CM | POA: Diagnosis not present

## 2021-01-04 DIAGNOSIS — D509 Iron deficiency anemia, unspecified: Secondary | ICD-10-CM | POA: Diagnosis not present

## 2021-01-04 DIAGNOSIS — N186 End stage renal disease: Secondary | ICD-10-CM | POA: Diagnosis not present

## 2021-01-04 DIAGNOSIS — D689 Coagulation defect, unspecified: Secondary | ICD-10-CM | POA: Diagnosis not present

## 2021-01-04 DIAGNOSIS — Z992 Dependence on renal dialysis: Secondary | ICD-10-CM | POA: Diagnosis not present

## 2021-01-04 DIAGNOSIS — E1121 Type 2 diabetes mellitus with diabetic nephropathy: Secondary | ICD-10-CM | POA: Diagnosis not present

## 2021-01-04 DIAGNOSIS — R519 Headache, unspecified: Secondary | ICD-10-CM | POA: Diagnosis not present

## 2021-01-04 DIAGNOSIS — N2581 Secondary hyperparathyroidism of renal origin: Secondary | ICD-10-CM | POA: Diagnosis not present

## 2021-01-04 DIAGNOSIS — Z4901 Encounter for fitting and adjustment of extracorporeal dialysis catheter: Secondary | ICD-10-CM | POA: Diagnosis not present

## 2021-01-04 DIAGNOSIS — Z23 Encounter for immunization: Secondary | ICD-10-CM | POA: Diagnosis not present

## 2021-01-05 ENCOUNTER — Encounter: Payer: Self-pay | Admitting: Family Medicine

## 2021-01-05 ENCOUNTER — Ambulatory Visit (INDEPENDENT_AMBULATORY_CARE_PROVIDER_SITE_OTHER): Payer: Medicare Other | Admitting: Family Medicine

## 2021-01-05 ENCOUNTER — Other Ambulatory Visit: Payer: Self-pay

## 2021-01-05 VITALS — BP 177/102 | HR 64 | Temp 97.9°F

## 2021-01-05 DIAGNOSIS — B009 Herpesviral infection, unspecified: Secondary | ICD-10-CM

## 2021-01-05 MED ORDER — VALACYCLOVIR HCL 1 G PO TABS: 1000.0000 mg | ORAL_TABLET | Freq: Two times a day (BID) | ORAL | 0 refills | Status: AC

## 2021-01-05 NOTE — Progress Notes (Signed)
Assessment & Plan:  1. HSV infection - valACYclovir (VALTREX) 1000 MG tablet; Take 1 tablet (1,000 mg total) by mouth 2 (two) times daily for 5 days.  Dispense: 10 tablet; Refill: 0   Follow up plan: Return as scheduled.  [name], MSN, APRN, FNP-C [facility]  Subjective:   Patient ID: [name], female    DOB: [birth year], 80 y.o.   MRN: [id number]  HPI: [name] is a 80 y.o. female presenting on 01/05/2021 for Rash (Patient had a rash on her right hand that is now spreading all over right arm. )  Patient is accompanied by her daughter-in-law, who she is okay with being present. Patient has a rash on her right hand that she was seen for four days ago. She was prescribed Mupirocin ointment, which she has been using as prescribed. There has been no improvement. She has new lesions on her right arm. The lesions are painful. No itching. She reports they started off as blisters.    ROS: Negative unless specifically indicated above in HPI.   Relevant past medical history reviewed and updated as indicated.   Allergies and medications reviewed and updated.   Current Outpatient Medications:    acetaminophen (TYLENOL) 500 MG tablet, Take 2 tablets (1,000 mg total) by mouth every 8 (eight) hours as needed for mild pain or headache., Disp: 30 tablet, Rfl: 0   albuterol (VENTOLIN HFA) 108 (90 Base) MCG/ACT inhaler, Inhale 2 puffs into the lungs every 6 (six) hours as needed for wheezing or shortness of breath., Disp: 18 g, Rfl: 2   alendronate (FOSAMAX) 70 MG tablet, Take 1 tablet (70 mg total) by mouth every Thursday., Disp: 12 tablet, Rfl: 3   AURYXIA 1 GM 210 MG(Fe) tablet, Take 210 mg by mouth 3 (three) times daily with meals., Disp: , Rfl:    budesonide-formoterol (SYMBICORT) 80-4.5 MCG/ACT inhaler, Inhale 2 puffs into the lungs 2 (two) times daily., Disp: 3 each, Rfl: 4   cinacalcet (SENSIPAR) 30 MG tablet, Take 1 tablet (30 mg total) by mouth every  Monday, Wednesday, and Friday., Disp: 60 tablet, Rfl: 0   citalopram (CELEXA) 20 MG tablet, TAKE 1 TABLET BY MOUTH EVERY DAY, Disp: 90 tablet, Rfl: 0   docusate calcium (SURFAK) 240 MG capsule, Take 1 capsule (240 mg total) by mouth daily., Disp: 90 capsule, Rfl: 1   doxercalciferol (HECTOROL) 4 MCG/2ML injection, Inject 0.5 mLs (1 mcg total) into the vein every Monday, Wednesday, and Friday with hemodialysis., Disp: 2 mL, Rfl: 0   ezetimibe (ZETIA) 10 MG tablet, Take 1 tablet (10 mg total) by mouth daily., Disp: 90 tablet, Rfl: 3   famotidine (PEPCID) 20 MG tablet, Take 1 tablet (20 mg total) by mouth 2 (two) times daily., Disp: 180 tablet, Rfl: 1   hydrALAZINE (APRESOLINE) 10 MG tablet, Take 1 tablet (10 mg total) by mouth 3 (three) times daily., Disp: 90 tablet, Rfl: 2   [START ON 01/18/2021] HYDROcodone-acetaminophen (NORCO/VICODIN) 5-325 MG tablet, Take 1 tablet by mouth every 6 (six) hours as needed for moderate pain., Disp: 60 tablet, Rfl: 0   HYDROcodone-acetaminophen (NORCO/VICODIN) 5-325 MG tablet, Take 1 tablet by mouth every 6 (six) hours as needed for moderate pain., Disp: 60 tablet, Rfl: 0   HYDROcodone-acetaminophen (NORCO/VICODIN) 5-325 MG tablet, Take 1 tablet by mouth every 6 (six) hours as needed for moderate pain., Disp: 60 tablet, Rfl: 0   levocetirizine (XYZAL) 5 MG tablet, TAKE 1 TABLET BY MOUTH EVERY DAY  IN THE EVENING, Disp: 90 tablet, Rfl: 1   levothyroxine (SYNTHROID) 112 MCG tablet, Take 1 tablet (112 mcg total) by mouth daily., Disp: 90 tablet, Rfl: 1   multivitamin (RENA-VIT) TABS tablet, Take 1 tablet by mouth daily., Disp: , Rfl:    mupirocin cream (BACTROBAN) 2 %, Apply 1 application topically 2 (two) times daily., Disp: 15 g, Rfl: 0   nystatin cream (MYCOSTATIN), Apply 1 application topically 2 (two) times daily as needed for dry skin., Disp: 30 g, Rfl: 2   pantoprazole (PROTONIX) 20 MG tablet, Take 1 tablet (20 mg total) by mouth daily., Disp: 90 tablet, Rfl: 1    pramipexole (MIRAPEX) 0.125 MG tablet, Take 1 tablet (0.125 mg total) by mouth at bedtime., Disp: 90 tablet, Rfl: 1   Vitamin D, Ergocalciferol, (DRISDOL) 1.25 MG (50000 UNIT) CAPS capsule, Take 1 capsule (50,000 Units total) by mouth every Monday., Disp: 12 capsule, Rfl: 3  Allergies  Allergen Reactions   Amoxicillin Rash    Has patient had a PCN reaction causing immediate rash, facial/tongue/throat swelling, SOB or lightheadedness with hypotension:YES Has patient had a PCN reaction causing severe rash involving mucus membranes or skin necrosis: Yes Has patient had a PCN reaction that required hospitalization No Has patient had a PCN reaction occurring within the last 10 years: Yes If all of the above answers are "NO", then may proceed with Cephalosporin use.    Elemental Sulfur Hives   Sulfur Hives   Peg 3350-Electrolytes Other (See Comments)   Sulfa Drugs Cross Reactors Hives and Itching   Miralax [Polyethylene Glycol] Itching   Mixed Grasses    Percocet [Oxycodone-Acetaminophen] Itching and Rash    Did not happen last time she took it   Sulfa Antibiotics Hives    Objective:   BP (!) 177/102   Pulse 64   Temp 97.9 F (36.6 C) (Temporal)    Physical Exam Vitals reviewed.  Constitutional:      General: She is not in acute distress.    Appearance: Normal appearance. She is not ill-appearing, toxic-appearing or diaphoretic.  HENT:     Head: Normocephalic and atraumatic.  Eyes:     General: No scleral icterus.       Right eye: No discharge.        Left eye: No discharge.     Conjunctiva/sclera: Conjunctivae normal.  Cardiovascular:     Rate and Rhythm: Normal rate.  Pulmonary:     Effort: Pulmonary effort is normal. No respiratory distress.  Musculoskeletal:        General: Normal range of motion.     Cervical back: Normal range of motion.  Skin:    General: Skin is warm and dry.     Capillary Refill: Capillary refill takes less than 2 seconds.     Findings: Rash  (palm of right hand and right arm - see picture below) present.  Neurological:     General: No focal deficit present.     Mental Status: She is alert and oriented to person, place, and time. Mental status is at baseline.  Psychiatric:        Mood and Affect: Mood normal.        Behavior: Behavior normal.        Thought Content: Thought content normal.        Judgment: Judgment normal.

## 2021-01-06 DIAGNOSIS — R519 Headache, unspecified: Secondary | ICD-10-CM | POA: Diagnosis not present

## 2021-01-06 DIAGNOSIS — Z4901 Encounter for fitting and adjustment of extracorporeal dialysis catheter: Secondary | ICD-10-CM | POA: Diagnosis not present

## 2021-01-06 DIAGNOSIS — N185 Chronic kidney disease, stage 5: Secondary | ICD-10-CM | POA: Diagnosis not present

## 2021-01-06 DIAGNOSIS — N186 End stage renal disease: Secondary | ICD-10-CM | POA: Diagnosis not present

## 2021-01-06 DIAGNOSIS — N2581 Secondary hyperparathyroidism of renal origin: Secondary | ICD-10-CM | POA: Diagnosis not present

## 2021-01-06 DIAGNOSIS — Z23 Encounter for immunization: Secondary | ICD-10-CM | POA: Diagnosis not present

## 2021-01-06 DIAGNOSIS — Z992 Dependence on renal dialysis: Secondary | ICD-10-CM | POA: Diagnosis not present

## 2021-01-06 DIAGNOSIS — D509 Iron deficiency anemia, unspecified: Secondary | ICD-10-CM | POA: Diagnosis not present

## 2021-01-06 DIAGNOSIS — E1121 Type 2 diabetes mellitus with diabetic nephropathy: Secondary | ICD-10-CM | POA: Diagnosis not present

## 2021-01-06 DIAGNOSIS — D689 Coagulation defect, unspecified: Secondary | ICD-10-CM | POA: Diagnosis not present

## 2021-01-08 DIAGNOSIS — Z992 Dependence on renal dialysis: Secondary | ICD-10-CM | POA: Diagnosis not present

## 2021-01-08 DIAGNOSIS — D689 Coagulation defect, unspecified: Secondary | ICD-10-CM | POA: Diagnosis not present

## 2021-01-08 DIAGNOSIS — N2581 Secondary hyperparathyroidism of renal origin: Secondary | ICD-10-CM | POA: Diagnosis not present

## 2021-01-08 DIAGNOSIS — R519 Headache, unspecified: Secondary | ICD-10-CM | POA: Diagnosis not present

## 2021-01-08 DIAGNOSIS — E1121 Type 2 diabetes mellitus with diabetic nephropathy: Secondary | ICD-10-CM | POA: Diagnosis not present

## 2021-01-08 DIAGNOSIS — N185 Chronic kidney disease, stage 5: Secondary | ICD-10-CM | POA: Diagnosis not present

## 2021-01-08 DIAGNOSIS — Z4901 Encounter for fitting and adjustment of extracorporeal dialysis catheter: Secondary | ICD-10-CM | POA: Diagnosis not present

## 2021-01-08 DIAGNOSIS — D509 Iron deficiency anemia, unspecified: Secondary | ICD-10-CM | POA: Diagnosis not present

## 2021-01-08 DIAGNOSIS — Z23 Encounter for immunization: Secondary | ICD-10-CM | POA: Diagnosis not present

## 2021-01-08 DIAGNOSIS — N186 End stage renal disease: Secondary | ICD-10-CM | POA: Diagnosis not present

## 2021-01-11 ENCOUNTER — Encounter: Payer: Self-pay | Admitting: Family Medicine

## 2021-01-11 DIAGNOSIS — Z4901 Encounter for fitting and adjustment of extracorporeal dialysis catheter: Secondary | ICD-10-CM | POA: Diagnosis not present

## 2021-01-11 DIAGNOSIS — Z23 Encounter for immunization: Secondary | ICD-10-CM | POA: Diagnosis not present

## 2021-01-11 DIAGNOSIS — N186 End stage renal disease: Secondary | ICD-10-CM | POA: Diagnosis not present

## 2021-01-11 DIAGNOSIS — D509 Iron deficiency anemia, unspecified: Secondary | ICD-10-CM | POA: Diagnosis not present

## 2021-01-11 DIAGNOSIS — N185 Chronic kidney disease, stage 5: Secondary | ICD-10-CM | POA: Diagnosis not present

## 2021-01-11 DIAGNOSIS — N2581 Secondary hyperparathyroidism of renal origin: Secondary | ICD-10-CM | POA: Diagnosis not present

## 2021-01-11 DIAGNOSIS — Z992 Dependence on renal dialysis: Secondary | ICD-10-CM | POA: Diagnosis not present

## 2021-01-11 DIAGNOSIS — R519 Headache, unspecified: Secondary | ICD-10-CM | POA: Diagnosis not present

## 2021-01-11 DIAGNOSIS — E1121 Type 2 diabetes mellitus with diabetic nephropathy: Secondary | ICD-10-CM | POA: Diagnosis not present

## 2021-01-11 DIAGNOSIS — D689 Coagulation defect, unspecified: Secondary | ICD-10-CM | POA: Diagnosis not present

## 2021-01-13 DIAGNOSIS — R519 Headache, unspecified: Secondary | ICD-10-CM | POA: Diagnosis not present

## 2021-01-13 DIAGNOSIS — N2581 Secondary hyperparathyroidism of renal origin: Secondary | ICD-10-CM | POA: Diagnosis not present

## 2021-01-13 DIAGNOSIS — N186 End stage renal disease: Secondary | ICD-10-CM | POA: Diagnosis not present

## 2021-01-13 DIAGNOSIS — Z992 Dependence on renal dialysis: Secondary | ICD-10-CM | POA: Diagnosis not present

## 2021-01-13 DIAGNOSIS — D689 Coagulation defect, unspecified: Secondary | ICD-10-CM | POA: Diagnosis not present

## 2021-01-13 DIAGNOSIS — N185 Chronic kidney disease, stage 5: Secondary | ICD-10-CM | POA: Diagnosis not present

## 2021-01-13 DIAGNOSIS — E1121 Type 2 diabetes mellitus with diabetic nephropathy: Secondary | ICD-10-CM | POA: Diagnosis not present

## 2021-01-13 DIAGNOSIS — Z23 Encounter for immunization: Secondary | ICD-10-CM | POA: Diagnosis not present

## 2021-01-13 DIAGNOSIS — Z4901 Encounter for fitting and adjustment of extracorporeal dialysis catheter: Secondary | ICD-10-CM | POA: Diagnosis not present

## 2021-01-13 DIAGNOSIS — D509 Iron deficiency anemia, unspecified: Secondary | ICD-10-CM | POA: Diagnosis not present

## 2021-01-15 DIAGNOSIS — N186 End stage renal disease: Secondary | ICD-10-CM | POA: Diagnosis not present

## 2021-01-15 DIAGNOSIS — E1121 Type 2 diabetes mellitus with diabetic nephropathy: Secondary | ICD-10-CM | POA: Diagnosis not present

## 2021-01-15 DIAGNOSIS — Z992 Dependence on renal dialysis: Secondary | ICD-10-CM | POA: Diagnosis not present

## 2021-01-15 DIAGNOSIS — Z4901 Encounter for fitting and adjustment of extracorporeal dialysis catheter: Secondary | ICD-10-CM | POA: Diagnosis not present

## 2021-01-15 DIAGNOSIS — D689 Coagulation defect, unspecified: Secondary | ICD-10-CM | POA: Diagnosis not present

## 2021-01-15 DIAGNOSIS — Z23 Encounter for immunization: Secondary | ICD-10-CM | POA: Diagnosis not present

## 2021-01-15 DIAGNOSIS — D509 Iron deficiency anemia, unspecified: Secondary | ICD-10-CM | POA: Diagnosis not present

## 2021-01-15 DIAGNOSIS — N2581 Secondary hyperparathyroidism of renal origin: Secondary | ICD-10-CM | POA: Diagnosis not present

## 2021-01-15 DIAGNOSIS — R519 Headache, unspecified: Secondary | ICD-10-CM | POA: Diagnosis not present

## 2021-01-15 DIAGNOSIS — N185 Chronic kidney disease, stage 5: Secondary | ICD-10-CM | POA: Diagnosis not present

## 2021-01-18 DIAGNOSIS — D689 Coagulation defect, unspecified: Secondary | ICD-10-CM | POA: Diagnosis not present

## 2021-01-18 DIAGNOSIS — Z23 Encounter for immunization: Secondary | ICD-10-CM | POA: Diagnosis not present

## 2021-01-18 DIAGNOSIS — R519 Headache, unspecified: Secondary | ICD-10-CM | POA: Diagnosis not present

## 2021-01-18 DIAGNOSIS — D509 Iron deficiency anemia, unspecified: Secondary | ICD-10-CM | POA: Diagnosis not present

## 2021-01-18 DIAGNOSIS — Z4901 Encounter for fitting and adjustment of extracorporeal dialysis catheter: Secondary | ICD-10-CM | POA: Diagnosis not present

## 2021-01-18 DIAGNOSIS — N186 End stage renal disease: Secondary | ICD-10-CM | POA: Diagnosis not present

## 2021-01-18 DIAGNOSIS — N2581 Secondary hyperparathyroidism of renal origin: Secondary | ICD-10-CM | POA: Diagnosis not present

## 2021-01-18 DIAGNOSIS — Z992 Dependence on renal dialysis: Secondary | ICD-10-CM | POA: Diagnosis not present

## 2021-01-18 DIAGNOSIS — N185 Chronic kidney disease, stage 5: Secondary | ICD-10-CM | POA: Diagnosis not present

## 2021-01-18 DIAGNOSIS — E1121 Type 2 diabetes mellitus with diabetic nephropathy: Secondary | ICD-10-CM | POA: Diagnosis not present

## 2021-01-20 DIAGNOSIS — R519 Headache, unspecified: Secondary | ICD-10-CM | POA: Diagnosis not present

## 2021-01-20 DIAGNOSIS — Z4901 Encounter for fitting and adjustment of extracorporeal dialysis catheter: Secondary | ICD-10-CM | POA: Diagnosis not present

## 2021-01-20 DIAGNOSIS — D689 Coagulation defect, unspecified: Secondary | ICD-10-CM | POA: Diagnosis not present

## 2021-01-20 DIAGNOSIS — E1121 Type 2 diabetes mellitus with diabetic nephropathy: Secondary | ICD-10-CM | POA: Diagnosis not present

## 2021-01-20 DIAGNOSIS — Z23 Encounter for immunization: Secondary | ICD-10-CM | POA: Diagnosis not present

## 2021-01-20 DIAGNOSIS — D509 Iron deficiency anemia, unspecified: Secondary | ICD-10-CM | POA: Diagnosis not present

## 2021-01-20 DIAGNOSIS — M6281 Muscle weakness (generalized): Secondary | ICD-10-CM | POA: Diagnosis not present

## 2021-01-20 DIAGNOSIS — Z96652 Presence of left artificial knee joint: Secondary | ICD-10-CM | POA: Diagnosis not present

## 2021-01-20 DIAGNOSIS — N186 End stage renal disease: Secondary | ICD-10-CM | POA: Diagnosis not present

## 2021-01-20 DIAGNOSIS — N185 Chronic kidney disease, stage 5: Secondary | ICD-10-CM | POA: Diagnosis not present

## 2021-01-20 DIAGNOSIS — M25562 Pain in left knee: Secondary | ICD-10-CM | POA: Diagnosis not present

## 2021-01-20 DIAGNOSIS — N2581 Secondary hyperparathyroidism of renal origin: Secondary | ICD-10-CM | POA: Diagnosis not present

## 2021-01-20 DIAGNOSIS — Z992 Dependence on renal dialysis: Secondary | ICD-10-CM | POA: Diagnosis not present

## 2021-01-21 ENCOUNTER — Other Ambulatory Visit: Payer: Self-pay | Admitting: Family Medicine

## 2021-01-21 ENCOUNTER — Encounter: Payer: Self-pay | Admitting: Family Medicine

## 2021-01-21 ENCOUNTER — Ambulatory Visit (INDEPENDENT_AMBULATORY_CARE_PROVIDER_SITE_OTHER): Payer: Medicare Other | Admitting: Family Medicine

## 2021-01-21 DIAGNOSIS — J40 Bronchitis, not specified as acute or chronic: Secondary | ICD-10-CM | POA: Diagnosis not present

## 2021-01-21 DIAGNOSIS — J329 Chronic sinusitis, unspecified: Secondary | ICD-10-CM | POA: Diagnosis not present

## 2021-01-21 DIAGNOSIS — G8929 Other chronic pain: Secondary | ICD-10-CM

## 2021-01-21 DIAGNOSIS — E559 Vitamin D deficiency, unspecified: Secondary | ICD-10-CM

## 2021-01-21 DIAGNOSIS — E039 Hypothyroidism, unspecified: Secondary | ICD-10-CM

## 2021-01-21 MED ORDER — LEVOFLOXACIN 500 MG PO TABS: 500.0000 mg | ORAL_TABLET | Freq: Every day | ORAL | 0 refills | Status: DC

## 2021-01-21 NOTE — Progress Notes (Signed)
Subjective:    Patient ID: [name], female    DOB: Aug 11, 1940, 80 y.o.   MRN: [id number]   HPI: [name] is a 80 y.o. female presenting for congestion, sore throat. Onset 3 days ago. Also coughing. Yellow thick sputum. Intermittent mild dyspnea. No fever. No rash. No earache. Denies NVD.  Prone to pneumonia.    Depression screen [facility] 2/9 12/01/2020 11/19/2020 08/18/2020 08/04/2020 07/06/2020  Decreased Interest 1 1 0 0 0  Down, Depressed, Hopeless 1 1 1  0 0  PHQ - 2 Score 2 2 1  0 0  Altered sleeping 3 3 3  - 0  Tired, decreased energy 3 3 3  - 0  Change in appetite 2 2 0 - 0  Feeling bad or failure about yourself  3 3 2  - 0  Trouble concentrating 2 2 0 - 0  Moving slowly or fidgety/restless 1 1 1  - 0  Suicidal thoughts 2 2 0 - 0  PHQ-9 Score 18 18 10  - 0  Difficult doing work/chores Very difficult Very difficult Somewhat difficult - -  Some recent data might be hidden     Relevant past medical, surgical, family and social history reviewed and updated as indicated.  Interim medical history since our last visit reviewed. Allergies and medications reviewed and updated.  ROS:  Review of Systems  Constitutional:  Negative for activity change, appetite change, chills and fever.  HENT:  Positive for congestion, postnasal drip, rhinorrhea and sinus pressure. Negative for ear discharge, ear pain, hearing loss, nosebleeds, sneezing and trouble swallowing.   Respiratory:  Negative for chest tightness and shortness of breath.   Cardiovascular:  Negative for chest pain and palpitations.  Skin:  Negative for rash.    Social History   Tobacco Use  Smoking Status Former   Packs/day: 0.25   Years: 2.00   Pack years: 0.50   Types: Cigarettes   Quit date: 04/04/1998   Years since quitting: 22.8  Smokeless Tobacco Never       Objective:     Wt Readings from Last 3 Encounters:  12/01/20 130 lb (59 kg)  12/01/20 129 lb (58.5 kg)  11/19/20 129 lb 12.8 oz (58.9 kg)     Exam  deferred. Pt. Harboring due to COVID 19. Phone visit performed.   Assessment & Plan:   1. Sinobronchitis     Meds ordered this encounter  Medications   levofloxacin (LEVAQUIN) 500 MG tablet    Sig: Take 1 tablet (500 mg total) by mouth daily.    Dispense:  7 tablet    Refill:  0    No orders of the defined types were placed in this encounter.     Diagnoses and all orders for this visit:  Sinobronchitis  Other orders -     levofloxacin (LEVAQUIN) 500 MG tablet; Take 1 tablet (500 mg total) by mouth daily.   Virtual Visit via telephone Note  I discussed the limitations, risks, security and privacy concerns of performing an evaluation and management service by telephone and the availability of in person appointments. The patient was identified with two identifiers. Pt.expressed understanding and agreed to proceed. Pt. Is at home. Dr. Livia Snellen is in his office.  Follow Up Instructions:   I discussed the assessment and treatment plan with the patient. The patient was provided an opportunity to ask questions and all were answered. The patient agreed with the plan and demonstrated an understanding of the instructions.   The patient was advised  to call back or seek an in-person evaluation if the symptoms worsen or if the condition fails to improve as anticipated.   Total minutes including chart review and phone contact time: 13   Follow up plan: Return if symptoms worsen or fail to improve.  Claretta Fraise, MD [place]

## 2021-01-21 NOTE — Telephone Encounter (Signed)
Aware of Vit D OTC recommendation

## 2021-01-21 NOTE — Telephone Encounter (Signed)
Please let patient know we can stop her weekly vitamin D supplement since her levels are normal now. She can take an over the counter vitamin D3 1,000 units once daily instead.

## 2021-01-22 DIAGNOSIS — Z4901 Encounter for fitting and adjustment of extracorporeal dialysis catheter: Secondary | ICD-10-CM | POA: Diagnosis not present

## 2021-01-22 DIAGNOSIS — N2581 Secondary hyperparathyroidism of renal origin: Secondary | ICD-10-CM | POA: Diagnosis not present

## 2021-01-22 DIAGNOSIS — E1121 Type 2 diabetes mellitus with diabetic nephropathy: Secondary | ICD-10-CM | POA: Diagnosis not present

## 2021-01-22 DIAGNOSIS — Z23 Encounter for immunization: Secondary | ICD-10-CM | POA: Diagnosis not present

## 2021-01-22 DIAGNOSIS — Z992 Dependence on renal dialysis: Secondary | ICD-10-CM | POA: Diagnosis not present

## 2021-01-22 DIAGNOSIS — N186 End stage renal disease: Secondary | ICD-10-CM | POA: Diagnosis not present

## 2021-01-22 DIAGNOSIS — R519 Headache, unspecified: Secondary | ICD-10-CM | POA: Diagnosis not present

## 2021-01-22 DIAGNOSIS — N185 Chronic kidney disease, stage 5: Secondary | ICD-10-CM | POA: Diagnosis not present

## 2021-01-22 DIAGNOSIS — D509 Iron deficiency anemia, unspecified: Secondary | ICD-10-CM | POA: Diagnosis not present

## 2021-01-22 DIAGNOSIS — D689 Coagulation defect, unspecified: Secondary | ICD-10-CM | POA: Diagnosis not present

## 2021-01-24 DIAGNOSIS — I480 Paroxysmal atrial fibrillation: Secondary | ICD-10-CM | POA: Diagnosis not present

## 2021-01-24 DIAGNOSIS — J9621 Acute and chronic respiratory failure with hypoxia: Secondary | ICD-10-CM | POA: Diagnosis not present

## 2021-01-24 DIAGNOSIS — M1990 Unspecified osteoarthritis, unspecified site: Secondary | ICD-10-CM | POA: Diagnosis not present

## 2021-01-24 DIAGNOSIS — N185 Chronic kidney disease, stage 5: Secondary | ICD-10-CM | POA: Diagnosis not present

## 2021-01-25 DIAGNOSIS — D689 Coagulation defect, unspecified: Secondary | ICD-10-CM | POA: Diagnosis not present

## 2021-01-25 DIAGNOSIS — Z23 Encounter for immunization: Secondary | ICD-10-CM | POA: Diagnosis not present

## 2021-01-25 DIAGNOSIS — Z4901 Encounter for fitting and adjustment of extracorporeal dialysis catheter: Secondary | ICD-10-CM | POA: Diagnosis not present

## 2021-01-25 DIAGNOSIS — N185 Chronic kidney disease, stage 5: Secondary | ICD-10-CM | POA: Diagnosis not present

## 2021-01-25 DIAGNOSIS — D509 Iron deficiency anemia, unspecified: Secondary | ICD-10-CM | POA: Diagnosis not present

## 2021-01-25 DIAGNOSIS — E1121 Type 2 diabetes mellitus with diabetic nephropathy: Secondary | ICD-10-CM | POA: Diagnosis not present

## 2021-01-25 DIAGNOSIS — Z992 Dependence on renal dialysis: Secondary | ICD-10-CM | POA: Diagnosis not present

## 2021-01-25 DIAGNOSIS — N186 End stage renal disease: Secondary | ICD-10-CM | POA: Diagnosis not present

## 2021-01-25 DIAGNOSIS — R519 Headache, unspecified: Secondary | ICD-10-CM | POA: Diagnosis not present

## 2021-01-25 DIAGNOSIS — N2581 Secondary hyperparathyroidism of renal origin: Secondary | ICD-10-CM | POA: Diagnosis not present

## 2021-01-26 DIAGNOSIS — Z4901 Encounter for fitting and adjustment of extracorporeal dialysis catheter: Secondary | ICD-10-CM | POA: Diagnosis not present

## 2021-01-26 DIAGNOSIS — D689 Coagulation defect, unspecified: Secondary | ICD-10-CM | POA: Diagnosis not present

## 2021-01-26 DIAGNOSIS — N185 Chronic kidney disease, stage 5: Secondary | ICD-10-CM | POA: Diagnosis not present

## 2021-01-26 DIAGNOSIS — D509 Iron deficiency anemia, unspecified: Secondary | ICD-10-CM | POA: Diagnosis not present

## 2021-01-26 DIAGNOSIS — N186 End stage renal disease: Secondary | ICD-10-CM | POA: Diagnosis not present

## 2021-01-26 DIAGNOSIS — N2581 Secondary hyperparathyroidism of renal origin: Secondary | ICD-10-CM | POA: Diagnosis not present

## 2021-01-26 DIAGNOSIS — Z992 Dependence on renal dialysis: Secondary | ICD-10-CM | POA: Diagnosis not present

## 2021-01-26 DIAGNOSIS — R519 Headache, unspecified: Secondary | ICD-10-CM | POA: Diagnosis not present

## 2021-01-26 DIAGNOSIS — Z23 Encounter for immunization: Secondary | ICD-10-CM | POA: Diagnosis not present

## 2021-01-26 DIAGNOSIS — E1121 Type 2 diabetes mellitus with diabetic nephropathy: Secondary | ICD-10-CM | POA: Diagnosis not present

## 2021-01-27 DIAGNOSIS — D689 Coagulation defect, unspecified: Secondary | ICD-10-CM | POA: Diagnosis not present

## 2021-01-27 DIAGNOSIS — D509 Iron deficiency anemia, unspecified: Secondary | ICD-10-CM | POA: Diagnosis not present

## 2021-01-27 DIAGNOSIS — R519 Headache, unspecified: Secondary | ICD-10-CM | POA: Diagnosis not present

## 2021-01-27 DIAGNOSIS — Z992 Dependence on renal dialysis: Secondary | ICD-10-CM | POA: Diagnosis not present

## 2021-01-27 DIAGNOSIS — N185 Chronic kidney disease, stage 5: Secondary | ICD-10-CM | POA: Diagnosis not present

## 2021-01-27 DIAGNOSIS — N186 End stage renal disease: Secondary | ICD-10-CM | POA: Diagnosis not present

## 2021-01-27 DIAGNOSIS — Z23 Encounter for immunization: Secondary | ICD-10-CM | POA: Diagnosis not present

## 2021-01-27 DIAGNOSIS — N2581 Secondary hyperparathyroidism of renal origin: Secondary | ICD-10-CM | POA: Diagnosis not present

## 2021-01-27 DIAGNOSIS — Z4901 Encounter for fitting and adjustment of extracorporeal dialysis catheter: Secondary | ICD-10-CM | POA: Diagnosis not present

## 2021-01-27 DIAGNOSIS — E1121 Type 2 diabetes mellitus with diabetic nephropathy: Secondary | ICD-10-CM | POA: Diagnosis not present

## 2021-01-27 DIAGNOSIS — I480 Paroxysmal atrial fibrillation: Secondary | ICD-10-CM | POA: Diagnosis not present

## 2021-01-28 DIAGNOSIS — M79676 Pain in unspecified toe(s): Secondary | ICD-10-CM | POA: Diagnosis not present

## 2021-01-28 DIAGNOSIS — B351 Tinea unguium: Secondary | ICD-10-CM | POA: Diagnosis not present

## 2021-01-28 DIAGNOSIS — L84 Corns and callosities: Secondary | ICD-10-CM | POA: Diagnosis not present

## 2021-01-28 DIAGNOSIS — I70203 Unspecified atherosclerosis of native arteries of extremities, bilateral legs: Secondary | ICD-10-CM | POA: Diagnosis not present

## 2021-01-29 DIAGNOSIS — Z23 Encounter for immunization: Secondary | ICD-10-CM | POA: Diagnosis not present

## 2021-01-29 DIAGNOSIS — D509 Iron deficiency anemia, unspecified: Secondary | ICD-10-CM | POA: Diagnosis not present

## 2021-01-29 DIAGNOSIS — E1121 Type 2 diabetes mellitus with diabetic nephropathy: Secondary | ICD-10-CM | POA: Diagnosis not present

## 2021-01-29 DIAGNOSIS — N2581 Secondary hyperparathyroidism of renal origin: Secondary | ICD-10-CM | POA: Diagnosis not present

## 2021-01-29 DIAGNOSIS — N185 Chronic kidney disease, stage 5: Secondary | ICD-10-CM | POA: Diagnosis not present

## 2021-01-29 DIAGNOSIS — Z4901 Encounter for fitting and adjustment of extracorporeal dialysis catheter: Secondary | ICD-10-CM | POA: Diagnosis not present

## 2021-01-29 DIAGNOSIS — N186 End stage renal disease: Secondary | ICD-10-CM | POA: Diagnosis not present

## 2021-01-29 DIAGNOSIS — Z992 Dependence on renal dialysis: Secondary | ICD-10-CM | POA: Diagnosis not present

## 2021-01-29 DIAGNOSIS — R519 Headache, unspecified: Secondary | ICD-10-CM | POA: Diagnosis not present

## 2021-01-29 DIAGNOSIS — D689 Coagulation defect, unspecified: Secondary | ICD-10-CM | POA: Diagnosis not present

## 2021-02-01 DIAGNOSIS — N2581 Secondary hyperparathyroidism of renal origin: Secondary | ICD-10-CM | POA: Diagnosis not present

## 2021-02-01 DIAGNOSIS — E1121 Type 2 diabetes mellitus with diabetic nephropathy: Secondary | ICD-10-CM | POA: Diagnosis not present

## 2021-02-01 DIAGNOSIS — D509 Iron deficiency anemia, unspecified: Secondary | ICD-10-CM | POA: Diagnosis not present

## 2021-02-01 DIAGNOSIS — R519 Headache, unspecified: Secondary | ICD-10-CM | POA: Diagnosis not present

## 2021-02-01 DIAGNOSIS — Z992 Dependence on renal dialysis: Secondary | ICD-10-CM | POA: Diagnosis not present

## 2021-02-01 DIAGNOSIS — E1122 Type 2 diabetes mellitus with diabetic chronic kidney disease: Secondary | ICD-10-CM | POA: Diagnosis not present

## 2021-02-01 DIAGNOSIS — N185 Chronic kidney disease, stage 5: Secondary | ICD-10-CM | POA: Diagnosis not present

## 2021-02-01 DIAGNOSIS — Z23 Encounter for immunization: Secondary | ICD-10-CM | POA: Diagnosis not present

## 2021-02-01 DIAGNOSIS — D689 Coagulation defect, unspecified: Secondary | ICD-10-CM | POA: Diagnosis not present

## 2021-02-01 DIAGNOSIS — N186 End stage renal disease: Secondary | ICD-10-CM | POA: Diagnosis not present

## 2021-02-01 DIAGNOSIS — Z4901 Encounter for fitting and adjustment of extracorporeal dialysis catheter: Secondary | ICD-10-CM | POA: Diagnosis not present

## 2021-02-03 DIAGNOSIS — D509 Iron deficiency anemia, unspecified: Secondary | ICD-10-CM | POA: Diagnosis not present

## 2021-02-03 DIAGNOSIS — Z992 Dependence on renal dialysis: Secondary | ICD-10-CM | POA: Diagnosis not present

## 2021-02-03 DIAGNOSIS — D631 Anemia in chronic kidney disease: Secondary | ICD-10-CM | POA: Diagnosis not present

## 2021-02-03 DIAGNOSIS — N186 End stage renal disease: Secondary | ICD-10-CM | POA: Diagnosis not present

## 2021-02-03 DIAGNOSIS — N2581 Secondary hyperparathyroidism of renal origin: Secondary | ICD-10-CM | POA: Diagnosis not present

## 2021-02-03 DIAGNOSIS — Z4901 Encounter for fitting and adjustment of extracorporeal dialysis catheter: Secondary | ICD-10-CM | POA: Diagnosis not present

## 2021-02-03 DIAGNOSIS — D689 Coagulation defect, unspecified: Secondary | ICD-10-CM | POA: Diagnosis not present

## 2021-02-05 DIAGNOSIS — D509 Iron deficiency anemia, unspecified: Secondary | ICD-10-CM | POA: Diagnosis not present

## 2021-02-05 DIAGNOSIS — D631 Anemia in chronic kidney disease: Secondary | ICD-10-CM | POA: Diagnosis not present

## 2021-02-05 DIAGNOSIS — Z992 Dependence on renal dialysis: Secondary | ICD-10-CM | POA: Diagnosis not present

## 2021-02-05 DIAGNOSIS — N186 End stage renal disease: Secondary | ICD-10-CM | POA: Diagnosis not present

## 2021-02-05 DIAGNOSIS — N2581 Secondary hyperparathyroidism of renal origin: Secondary | ICD-10-CM | POA: Diagnosis not present

## 2021-02-05 DIAGNOSIS — D689 Coagulation defect, unspecified: Secondary | ICD-10-CM | POA: Diagnosis not present

## 2021-02-05 DIAGNOSIS — Z4901 Encounter for fitting and adjustment of extracorporeal dialysis catheter: Secondary | ICD-10-CM | POA: Diagnosis not present

## 2021-02-08 DIAGNOSIS — Z992 Dependence on renal dialysis: Secondary | ICD-10-CM | POA: Diagnosis not present

## 2021-02-08 DIAGNOSIS — D689 Coagulation defect, unspecified: Secondary | ICD-10-CM | POA: Diagnosis not present

## 2021-02-08 DIAGNOSIS — N186 End stage renal disease: Secondary | ICD-10-CM | POA: Diagnosis not present

## 2021-02-08 DIAGNOSIS — Z4901 Encounter for fitting and adjustment of extracorporeal dialysis catheter: Secondary | ICD-10-CM | POA: Diagnosis not present

## 2021-02-08 DIAGNOSIS — D631 Anemia in chronic kidney disease: Secondary | ICD-10-CM | POA: Diagnosis not present

## 2021-02-08 DIAGNOSIS — D509 Iron deficiency anemia, unspecified: Secondary | ICD-10-CM | POA: Diagnosis not present

## 2021-02-08 DIAGNOSIS — N2581 Secondary hyperparathyroidism of renal origin: Secondary | ICD-10-CM | POA: Diagnosis not present

## 2021-02-10 DIAGNOSIS — D689 Coagulation defect, unspecified: Secondary | ICD-10-CM | POA: Diagnosis not present

## 2021-02-10 DIAGNOSIS — Z4901 Encounter for fitting and adjustment of extracorporeal dialysis catheter: Secondary | ICD-10-CM | POA: Diagnosis not present

## 2021-02-10 DIAGNOSIS — N186 End stage renal disease: Secondary | ICD-10-CM | POA: Diagnosis not present

## 2021-02-10 DIAGNOSIS — D631 Anemia in chronic kidney disease: Secondary | ICD-10-CM | POA: Diagnosis not present

## 2021-02-10 DIAGNOSIS — Z992 Dependence on renal dialysis: Secondary | ICD-10-CM | POA: Diagnosis not present

## 2021-02-10 DIAGNOSIS — D509 Iron deficiency anemia, unspecified: Secondary | ICD-10-CM | POA: Diagnosis not present

## 2021-02-10 DIAGNOSIS — N2581 Secondary hyperparathyroidism of renal origin: Secondary | ICD-10-CM | POA: Diagnosis not present

## 2021-02-12 DIAGNOSIS — D631 Anemia in chronic kidney disease: Secondary | ICD-10-CM | POA: Diagnosis not present

## 2021-02-12 DIAGNOSIS — D509 Iron deficiency anemia, unspecified: Secondary | ICD-10-CM | POA: Diagnosis not present

## 2021-02-12 DIAGNOSIS — D689 Coagulation defect, unspecified: Secondary | ICD-10-CM | POA: Diagnosis not present

## 2021-02-12 DIAGNOSIS — N186 End stage renal disease: Secondary | ICD-10-CM | POA: Diagnosis not present

## 2021-02-12 DIAGNOSIS — Z992 Dependence on renal dialysis: Secondary | ICD-10-CM | POA: Diagnosis not present

## 2021-02-12 DIAGNOSIS — Z4901 Encounter for fitting and adjustment of extracorporeal dialysis catheter: Secondary | ICD-10-CM | POA: Diagnosis not present

## 2021-02-12 DIAGNOSIS — N2581 Secondary hyperparathyroidism of renal origin: Secondary | ICD-10-CM | POA: Diagnosis not present

## 2021-02-15 DIAGNOSIS — Z992 Dependence on renal dialysis: Secondary | ICD-10-CM | POA: Diagnosis not present

## 2021-02-15 DIAGNOSIS — D689 Coagulation defect, unspecified: Secondary | ICD-10-CM | POA: Diagnosis not present

## 2021-02-15 DIAGNOSIS — Z4901 Encounter for fitting and adjustment of extracorporeal dialysis catheter: Secondary | ICD-10-CM | POA: Diagnosis not present

## 2021-02-15 DIAGNOSIS — D509 Iron deficiency anemia, unspecified: Secondary | ICD-10-CM | POA: Diagnosis not present

## 2021-02-15 DIAGNOSIS — D631 Anemia in chronic kidney disease: Secondary | ICD-10-CM | POA: Diagnosis not present

## 2021-02-15 DIAGNOSIS — N2581 Secondary hyperparathyroidism of renal origin: Secondary | ICD-10-CM | POA: Diagnosis not present

## 2021-02-15 DIAGNOSIS — N186 End stage renal disease: Secondary | ICD-10-CM | POA: Diagnosis not present

## 2021-02-17 DIAGNOSIS — N186 End stage renal disease: Secondary | ICD-10-CM | POA: Diagnosis not present

## 2021-02-17 DIAGNOSIS — Z4901 Encounter for fitting and adjustment of extracorporeal dialysis catheter: Secondary | ICD-10-CM | POA: Diagnosis not present

## 2021-02-17 DIAGNOSIS — N2581 Secondary hyperparathyroidism of renal origin: Secondary | ICD-10-CM | POA: Diagnosis not present

## 2021-02-17 DIAGNOSIS — D631 Anemia in chronic kidney disease: Secondary | ICD-10-CM | POA: Diagnosis not present

## 2021-02-17 DIAGNOSIS — Z992 Dependence on renal dialysis: Secondary | ICD-10-CM | POA: Diagnosis not present

## 2021-02-17 DIAGNOSIS — D509 Iron deficiency anemia, unspecified: Secondary | ICD-10-CM | POA: Diagnosis not present

## 2021-02-17 DIAGNOSIS — D689 Coagulation defect, unspecified: Secondary | ICD-10-CM | POA: Diagnosis not present

## 2021-02-19 DIAGNOSIS — N186 End stage renal disease: Secondary | ICD-10-CM | POA: Diagnosis not present

## 2021-02-19 DIAGNOSIS — D689 Coagulation defect, unspecified: Secondary | ICD-10-CM | POA: Diagnosis not present

## 2021-02-19 DIAGNOSIS — D509 Iron deficiency anemia, unspecified: Secondary | ICD-10-CM | POA: Diagnosis not present

## 2021-02-19 DIAGNOSIS — D631 Anemia in chronic kidney disease: Secondary | ICD-10-CM | POA: Diagnosis not present

## 2021-02-19 DIAGNOSIS — Z4901 Encounter for fitting and adjustment of extracorporeal dialysis catheter: Secondary | ICD-10-CM | POA: Diagnosis not present

## 2021-02-19 DIAGNOSIS — Z992 Dependence on renal dialysis: Secondary | ICD-10-CM | POA: Diagnosis not present

## 2021-02-19 DIAGNOSIS — N2581 Secondary hyperparathyroidism of renal origin: Secondary | ICD-10-CM | POA: Diagnosis not present

## 2021-02-22 DIAGNOSIS — D631 Anemia in chronic kidney disease: Secondary | ICD-10-CM | POA: Diagnosis not present

## 2021-02-22 DIAGNOSIS — Z4901 Encounter for fitting and adjustment of extracorporeal dialysis catheter: Secondary | ICD-10-CM | POA: Diagnosis not present

## 2021-02-22 DIAGNOSIS — D689 Coagulation defect, unspecified: Secondary | ICD-10-CM | POA: Diagnosis not present

## 2021-02-22 DIAGNOSIS — N186 End stage renal disease: Secondary | ICD-10-CM | POA: Diagnosis not present

## 2021-02-22 DIAGNOSIS — D509 Iron deficiency anemia, unspecified: Secondary | ICD-10-CM | POA: Diagnosis not present

## 2021-02-22 DIAGNOSIS — Z992 Dependence on renal dialysis: Secondary | ICD-10-CM | POA: Diagnosis not present

## 2021-02-22 DIAGNOSIS — N2581 Secondary hyperparathyroidism of renal origin: Secondary | ICD-10-CM | POA: Diagnosis not present

## 2021-02-23 ENCOUNTER — Other Ambulatory Visit: Payer: Self-pay

## 2021-02-23 ENCOUNTER — Encounter: Payer: Self-pay | Admitting: Family Medicine

## 2021-02-23 ENCOUNTER — Ambulatory Visit (INDEPENDENT_AMBULATORY_CARE_PROVIDER_SITE_OTHER): Payer: Medicare Other | Admitting: Family Medicine

## 2021-02-23 VITALS — BP 140/80 | HR 78 | Temp 97.8°F

## 2021-02-23 DIAGNOSIS — F331 Major depressive disorder, recurrent, moderate: Secondary | ICD-10-CM | POA: Diagnosis not present

## 2021-02-23 DIAGNOSIS — Z79899 Other long term (current) drug therapy: Secondary | ICD-10-CM

## 2021-02-23 DIAGNOSIS — F419 Anxiety disorder, unspecified: Secondary | ICD-10-CM | POA: Diagnosis not present

## 2021-02-23 DIAGNOSIS — M546 Pain in thoracic spine: Secondary | ICD-10-CM | POA: Diagnosis not present

## 2021-02-23 DIAGNOSIS — Z0000 Encounter for general adult medical examination without abnormal findings: Secondary | ICD-10-CM | POA: Diagnosis not present

## 2021-02-23 DIAGNOSIS — H6123 Impacted cerumen, bilateral: Secondary | ICD-10-CM

## 2021-02-23 DIAGNOSIS — I10 Essential (primary) hypertension: Secondary | ICD-10-CM | POA: Diagnosis not present

## 2021-02-23 DIAGNOSIS — Z0001 Encounter for general adult medical examination with abnormal findings: Secondary | ICD-10-CM | POA: Diagnosis not present

## 2021-02-23 DIAGNOSIS — N186 End stage renal disease: Secondary | ICD-10-CM

## 2021-02-23 DIAGNOSIS — M1990 Unspecified osteoarthritis, unspecified site: Secondary | ICD-10-CM

## 2021-02-23 DIAGNOSIS — G2581 Restless legs syndrome: Secondary | ICD-10-CM | POA: Diagnosis not present

## 2021-02-23 DIAGNOSIS — G8929 Other chronic pain: Secondary | ICD-10-CM

## 2021-02-23 DIAGNOSIS — E039 Hypothyroidism, unspecified: Secondary | ICD-10-CM | POA: Diagnosis not present

## 2021-02-23 DIAGNOSIS — Z992 Dependence on renal dialysis: Secondary | ICD-10-CM

## 2021-02-23 MED ORDER — HYDROCODONE-ACETAMINOPHEN 7.5-325 MG PO TABS: 1.0000 | ORAL_TABLET | Freq: Four times a day (QID) | ORAL | 0 refills | Status: DC | PRN

## 2021-02-23 MED ORDER — CITALOPRAM HYDROBROMIDE 40 MG PO TABS: 40.0000 mg | ORAL_TABLET | Freq: Every day | ORAL | 2 refills | Status: DC

## 2021-02-23 MED ORDER — PRAMIPEXOLE DIHYDROCHLORIDE 0.125 MG PO TABS: 0.1250 mg | ORAL_TABLET | Freq: Every day | ORAL | 1 refills | Status: DC

## 2021-02-23 MED ORDER — HYDRALAZINE HCL 10 MG PO TABS: 10.0000 mg | ORAL_TABLET | Freq: Three times a day (TID) | ORAL | 2 refills | Status: DC

## 2021-02-23 NOTE — Progress Notes (Signed)
Assessment & Plan:  1. Well adult exam Preventive health education provided. - CBC with Differential/Platelet - CMP14+EGFR - Lipid panel  2. Anxiety Uncontrolled. Celexa increased from 20 mg to 40 mg daily. - citalopram (CELEXA) 40 MG tablet; Take 1 tablet (40 mg total) by mouth daily.  Dispense: 30 tablet; Refill: 2 - CMP14+EGFR  3. Moderate episode of recurrent major depressive disorder (HCC) Uncontrolled, but improving. Celexa increased from 20 mg to 40 mg daily. - citalopram (CELEXA) 40 MG tablet; Take 1 tablet (40 mg total) by mouth daily.  Dispense: 30 tablet; Refill: 2 - CMP14+EGFR  4. Essential hypertension Uncontrolled. Hydralazine increased from BID to TID. - hydrALAZINE (APRESOLINE) 10 MG tablet; Take 1 tablet (10 mg total) by mouth 3 (three) times daily.  Dispense: 90 tablet; Refill: 2 - CBC with Differential/Platelet - CMP14+EGFR - Lipid panel  5-7. Chronic midline thoracic back pain/Arthritis/Controlled substance agreement signed Not adequately controlled on Norco 5/325. Dosage increased to 7.5/325. Offered referral to pain management as she is more difficult to manage pain due to ESRD on dialysis, but she declined. Controlled substance agreement in place. Blood drug screen as expected. PDMP reviewed with no concerning findings.  - CMP14+EGFR - HYDROcodone-acetaminophen (NORCO) 7.5-325 MG tablet; Take 1 tablet by mouth every 6 (six) hours as needed for moderate pain.  Dispense: 90 tablet; Refill: 0 - HYDROcodone-acetaminophen (NORCO) 7.5-325 MG tablet; Take 1 tablet by mouth every 6 (six) hours as needed for moderate pain.  Dispense: 90 tablet; Refill: 0 - HYDROcodone-acetaminophen (NORCO) 7.5-325 MG tablet; Take 1 tablet by mouth every 6 (six) hours as needed for moderate pain.  Dispense: 90 tablet; Refill: 0  8. ESRD (end stage renal disease) on dialysis [facility]) Continue dialysis.  9. Restless leg syndrome Uncontrolled without medication. Refill sent.  -  pramipexole (MIRAPEX) 0.125 MG tablet; Take 1 tablet (0.125 mg total) by mouth at bedtime.  Dispense: 90 tablet; Refill: 1 - CMP14+EGFR  10. Acquired hypothyroidism Well controlled on current regimen.  - TSH - T4, free  11. Bilateral impacted cerumen Encouraged to purchase Debrox wax removal kit over the counter. Drops (3-4) should be instilled twice daily x3 days, then the ear flushed with warm water on the 4th day.    Follow-up: Return in about 3 months (around 05/26/2021) for follow-up of chronic medication conditions.   [name], MSN, APRN, FNP-C [facility]  Subjective:  Patient ID: [name], female    DOB: 1940/06/13  Age: 80 y.o. MRN: [id number]  Patient Care Team: [name], FNP as PCP - General (Family Medicine) Minus Breeding, MD as Consulting Physician (Cardiology) Tanda Rockers, MD as Consulting Physician (Pulmonary Disease) Jamal Maes, MD as Consulting Physician (Nephrology) Steffanie Rainwater, DPM as Consulting Physician (Podiatry) Rosita Fire, MD as Consulting Physician (Nephrology)   CC:  Chief Complaint  Patient presents with   Annual Exam    HPI [name] presents for her annual physical. She is accompanied by her daughter-in-law.  Occupation: retired, Marital status: widowed, Substance use: none Diet: regular, Exercise: leg exercises sitting in her chair Last eye exam: last year Last dental exam: has dentures DEXA: 05/16/2019 (osteoporosis) Lung Cancer Screening with low-dose Chest CT: quit >15 years ago. Immunizations: Flu Vaccine: up to date Tdap Vaccine: up to date  Shingrix Vaccine: declined  COVID-19 Vaccine: up to date Pneumonia Vaccine: up to date  Advanced Directives Patient does have advanced directives including DNR, living will, and healthcare power of attorney.  She does have a copy in the electronic medical record.    Anxiety/Depression: improving with Celexa, but could be  better.  Depression screen [facility] 2/9 02/23/2021 12/01/2020 11/19/2020  Decreased Interest _0 Down, Depressed, Hopeless _1 PHQ - 2 Score _2 Altered sleeping _3 Tired, decreased energy _4 Change in appetite 0 2 2  Feeling bad or failure about yourself  _5 Trouble concentrating 0 2 2  Moving slowly or fidgety/restless 0 1 1  Suicidal thoughts 0 2 2  PHQ-9 Score _6 Difficult doing work/chores Somewhat difficult Very difficult Very difficult  Some recent data might be hidden   GAD 7 : Generalized Anxiety Score 02/23/2021 11/19/2020 08/18/2020 05/19/2020  Nervous, Anxious, on Edge 2 2 0 2  Control/stop worrying _7 0  Worry too much - different things _8 Trouble relaxing _9 Restless 1 0 3 0  Easily annoyed or irritable _10 Afraid - awful might happen _11 Total GAD 7 Score _12 Anxiety Difficulty Somewhat difficult Very difficult Somewhat difficult Somewhat difficult    Hypertension: remains elevated at home and dialysis. She did not increase hydralazine as previously directed since her bottle still said twice daily and not three times daily.   Pain: patient reports her Norco does not last long enough to adequately treat her chronic pain. She states the medication effects go away after 2.5 hours. Her daughter-in-law reports she takes one every 6-8 hours while awake. In the past patient was not taking often due to a fear of addiction. She is prescribed #60 each month.   RLS: patient has been out of her Mirapex for unknown reasons as it was filled x6 months at her chronic follow-up 3 months ago.  Patient was able to get a lift chair and transport wheelchair.   Review of Systems  Constitutional:  Negative for chills, fever, malaise/fatigue and weight loss.  HENT:  Positive for hearing loss (left ear). Negative for congestion, ear discharge, ear pain, nosebleeds, sinus pain, sore throat and tinnitus.   Eyes:  Negative for blurred  vision, double vision, pain, discharge and redness.  Respiratory:  Negative for cough, shortness of breath and wheezing.   Cardiovascular:  Negative for chest pain, palpitations and leg swelling.  Gastrointestinal:  Negative for abdominal pain, constipation, diarrhea, heartburn, nausea and vomiting.  Genitourinary:  Negative for dysuria, frequency and urgency.  Musculoskeletal:  Positive for back pain and joint pain. Negative for myalgias.  Skin:  Negative for rash.  Neurological:  Positive for weakness. Negative for dizziness, seizures and headaches.  Psychiatric/Behavioral:  Positive for depression. Negative for substance abuse and suicidal ideas. The patient is nervous/anxious.     Current Outpatient Medications:    acetaminophen (TYLENOL) 500 MG tablet, Take 2 tablets (1,000 mg total) by mouth every 8 (eight) hours as needed for mild pain or headache., Disp: 30 tablet, Rfl: 0   albuterol (VENTOLIN HFA) 108 (90 Base) MCG/ACT inhaler, Inhale 2 puffs into the lungs every 6 (six) hours as needed for wheezing or shortness of breath., Disp: 18 g, Rfl: 2   alendronate (FOSAMAX) 70 MG tablet, Take 1 tablet (70 mg total) by mouth every Thursday., Disp: 12 tablet, Rfl: 3   AURYXIA 1 GM 210 MG(Fe) tablet, Take 210 mg by mouth 3 (three) times  daily with meals., Disp: , Rfl:    budesonide-formoterol (SYMBICORT) 80-4.5 MCG/ACT inhaler, Inhale 2 puffs into the lungs 2 (two) times daily., Disp: 3 each, Rfl: 4   cinacalcet (SENSIPAR) 30 MG tablet, Take 1 tablet (30 mg total) by mouth every Monday, Wednesday, and Friday., Disp: 60 tablet, Rfl: 0   citalopram (CELEXA) 20 MG tablet, TAKE 1 TABLET BY MOUTH EVERY DAY, Disp: 90 tablet, Rfl: 0   docusate calcium (SURFAK) 240 MG capsule, Take 1 capsule (240 mg total) by mouth daily., Disp: 90 capsule, Rfl: 1   doxercalciferol (HECTOROL) 4 MCG/2ML injection, Inject 0.5 mLs (1 mcg total) into the vein every Monday, Wednesday, and Friday with hemodialysis., Disp: 2 mL,  Rfl: 0   ezetimibe (ZETIA) 10 MG tablet, Take 1 tablet (10 mg total) by mouth daily., Disp: 90 tablet, Rfl: 3   famotidine (PEPCID) 20 MG tablet, Take 1 tablet (20 mg total) by mouth 2 (two) times daily., Disp: 180 tablet, Rfl: 1   hydrALAZINE (APRESOLINE) 10 MG tablet, Take 1 tablet (10 mg total) by mouth 3 (three) times daily., Disp: 90 tablet, Rfl: 2   HYDROcodone-acetaminophen (NORCO/VICODIN) 5-325 MG tablet, Take 1 tablet by mouth every 6 (six) hours as needed for moderate pain., Disp: 60 tablet, Rfl: 0   HYDROcodone-acetaminophen (NORCO/VICODIN) 5-325 MG tablet, Take 1 tablet by mouth every 6 (six) hours as needed for moderate pain., Disp: 60 tablet, Rfl: 0   HYDROcodone-acetaminophen (NORCO/VICODIN) 5-325 MG tablet, Take 1 tablet by mouth every 6 (six) hours as needed for moderate pain., Disp: 60 tablet, Rfl: 0   levocetirizine (XYZAL) 5 MG tablet, TAKE 1 TABLET BY MOUTH EVERY DAY IN THE EVENING, Disp: 90 tablet, Rfl: 1   levothyroxine (SYNTHROID) 112 MCG tablet, TAKE 1 TABLET BY MOUTH EVERY DAY, Disp: 90 tablet, Rfl: 1   multivitamin (RENA-VIT) TABS tablet, Take 1 tablet by mouth daily., Disp: , Rfl:    mupirocin cream (BACTROBAN) 2 %, Apply 1 application topically 2 (two) times daily., Disp: 15 g, Rfl: 0   nystatin cream (MYCOSTATIN), Apply 1 application topically 2 (two) times daily as needed for dry skin., Disp: 30 g, Rfl: 2   pantoprazole (PROTONIX) 20 MG tablet, Take 1 tablet (20 mg total) by mouth daily., Disp: 90 tablet, Rfl: 1   pramipexole (MIRAPEX) 0.125 MG tablet, Take 1 tablet (0.125 mg total) by mouth at bedtime., Disp: 90 tablet, Rfl: 1  Allergies  Allergen Reactions   Amoxicillin Rash    Has patient had a PCN reaction causing immediate rash, facial/tongue/throat swelling, SOB or lightheadedness with hypotension:YES Has patient had a PCN reaction causing severe rash involving mucus membranes or skin necrosis: Yes Has patient had a PCN reaction that required hospitalization  No Has patient had a PCN reaction occurring within the last 10 years: Yes If all of the above answers are "NO", then may proceed with Cephalosporin use.    Elemental Sulfur Hives   Sulfur Hives   Peg 3350-Electrolytes Other (See Comments)   Sulfa Drugs Cross Reactors Hives and Itching   Miralax [Polyethylene Glycol] Itching   Mixed Grasses    Percocet [Oxycodone-Acetaminophen] Itching and Rash    Did not happen last time she took it   Sulfa Antibiotics Hives    Past Medical History:  Diagnosis Date   Acute respiratory failure ([place]) 05/2017   Anemia    Anxiety    Arthritis    COPD (chronic obstructive pulmonary disease) (HCC)    Depression    ESRD (  end stage renal disease) ([place])    dialysis MWF   GERD (gastroesophageal reflux disease)    Gout    History of blood transfusion    History of diabetes mellitus    "diet controlled"   HLD (hyperlipidemia)    HOH (hard of hearing)    left ear   Hypertension    hypotensive -since starting dialysis   Hypothyroidism    MRSA (methicillin resistant staph aureus) culture positive 06/01/2017   PAF (paroxysmal atrial fibrillation) ([place])    a. Echo 11/16:  Mild LVH, EF 55-60%, normal wall motion, MAC, mild MR, severe LAE (49 ml/m2), mild RVE, normal RVSF, mild RAE, mild TR, PASP 24 mmHg;  CHADS2-VASc: 4 >> Coumadin followed by PCP    Past Surgical History:  Procedure Laterality Date   AV FISTULA PLACEMENT  04/03/2012   Procedure: ARTERIOVENOUS (AV) FISTULA CREATION;  Surgeon: Elam Dutch, MD;  Location: [place];  Service: Vascular;  Laterality: Left;  creation left brachial cephalic fistula    BIOPSY  08/21/2018   Procedure: BIOPSY;  Surgeon: Thornton Park, MD;  Location: [place];  Service: Gastroenterology;;   CARPAL TUNNEL RELEASE Bilateral    COLONOSCOPY W/ POLYPECTOMY     COLONOSCOPY WITH PROPOFOL N/A 08/21/2018   Procedure: COLONOSCOPY WITH PROPOFOL;  Surgeon: Thornton Park, MD;  Location: [facility] ENDOSCOPY;  Service:  Gastroenterology;  Laterality: N/A;   DILATION AND CURETTAGE OF UTERUS     ESOPHAGOGASTRODUODENOSCOPY (EGD) WITH PROPOFOL N/A 08/21/2018   Procedure: ESOPHAGOGASTRODUODENOSCOPY (EGD) WITH PROPOFOL;  Surgeon: Thornton Park, MD;  Location: [place];  Service: Gastroenterology;  Laterality: N/A;   EYE SURGERY Bilateral    bilateral cataract removal   HEMATOMA EVACUATION Left 05/14/2018   Procedure: Incision and Drainage of Left Arm Hematoma;  Surgeon: Elam Dutch, MD;  Location: [place];  Service: Vascular;  Laterality: Left;   Hemodialysis  catheter Right    IR FLUORO GUIDE CV LINE RIGHT  05/26/2017   IR REMOVAL TUN CV CATH W/O FL  05/24/2017   IR US GUIDE VASC ACCESS RIGHT  05/26/2017   LIGATION OF ARTERIOVENOUS  FISTULA Left 02/04/2015   Procedure: LIGATION OF BRACHIOCEPHALIC ARTERIOVENOUS  FISTULA;  Surgeon: Conrad [place], MD;  Location: [place];  Service: Vascular;  Laterality: Left;   MULTIPLE TOOTH EXTRACTIONS     PARTIAL HIP ARTHROPLASTY     POLYPECTOMY  08/21/2018   Procedure: POLYPECTOMY;  Surgeon: Thornton Park, MD;  Location: [place];  Service: Gastroenterology;;   PORTACATH PLACEMENT     REVERSE SHOULDER ARTHROPLASTY Left 01/22/2016   Procedure: LEFT REVERSE SHOULDER ARTHROPLASTY;  Surgeon: Netta Cedars, MD;  Location: [place];  Service: Orthopedics;  Laterality: Left;   SHOULDER ARTHROSCOPY Bilateral    SPLIT NIGHT STUDY  07/26/2015   STERIOD INJECTION Right 01/22/2016   Procedure: RIGHT RING FINGER STEROID INJECTION;  Surgeon: Netta Cedars, MD;  Location: [place];  Service: Orthopedics;  Laterality: Right;   TEE WITHOUT CARDIOVERSION N/A 05/26/2017   Procedure: TRANSESOPHAGEAL ECHOCARDIOGRAM (TEE);  Surgeon: Lelon Perla, MD;  Location: [place];  Service: Cardiovascular;  Laterality: N/A;   THROMBECTOMY BRACHIAL ARTERY Left 02/06/2015   Procedure: EVACUATION OF LEFT ARM HEMATOMA;  Surgeon: Angelia Mould, MD;  Location: [facility] OR;  Service: Vascular;   Laterality: Left;   TOTAL KNEE ARTHROPLASTY Bilateral    TUBAL LIGATION      Family History  Problem Relation Age of Onset   Arthritis Mother    Diabetes Father  before age 6   Heart disease Father    Diabetes Sister    Cancer Brother    Hyperlipidemia Daughter    Hypertension Daughter    Diabetes Daughter    Hypertension Son    Hyperlipidemia Son    Dementia Sister    Cancer Sister        Unknown   Other Sister        Died in car accident   Stroke Brother    Diabetes Daughter    Hypertension Daughter    Hyperlipidemia Daughter    Hepatitis C Son     Social History   Socioeconomic History   Marital status: Widowed    Spouse name: Not on file   Number of children: 6   Years of education: 10   Highest education level: 10th grade  Occupational History   Occupation: RETIRED  Tobacco Use   Smoking status: Former    Packs/day: 0.25    Years: 2.00    Pack years: 0.50    Types: Cigarettes    Quit date: 04/04/1998    Years since quitting: 22.9   Smokeless tobacco: Never  Vaping Use   Vaping Use: Never used  Substance and Sexual Activity   Alcohol use: No    Alcohol/week: 0.0 standard drinks   Drug use: No   Sexual activity: Not Currently    Birth control/protection: None  Other Topics Concern   Not on file  Social History Narrative   ** Merged History Encounter ** Widowed   6 children   Lives with son and daughter in Probation [place]   ESRD - dialysis 3x per week   Cognitive impairment   Social Determinants of Health   Financial Resource Strain: Low Risk    Difficulty of Paying Living Expenses: Not hard at all  Food Insecurity: No Food Insecurity   Worried About Charity fundraiser in the Last Year: Never true   Arboriculturist in the Last Year: Never true  Transportation Needs: No Transportation Needs   Lack of Transportation (Medical): No   Lack of Transportation (Non-Medical): No  Physical Activity: Inactive   Days of Exercise per Week: 0  days   Minutes of Exercise per Session: 0 min  Stress: No Stress Concern Present   Feeling of Stress : Only a little  Social Connections: Moderately Isolated   Frequency of Communication with Friends and Family: More than three times a week   Frequency of Social Gatherings with Friends and Family: More than three times a week   Attends Religious Services: 1 to 4 times per year   Active Member of [employer] or Organizations: No   Attends Archivist Meetings: Never   Marital Status: Widowed  Human resources officer Violence: Not At Risk   Fear of Current or Ex-Partner: No   Emotionally Abused: No   Physically Abused: No   Sexually Abused: No      Objective:    BP 140/80   Pulse 78   Temp 97.8 F (36.6 C) (Temporal)   SpO2 98%   Wt Readings from Last 3 Encounters:  12/01/20 130 lb (59 kg)  12/01/20 129 lb (58.5 kg)  11/19/20 129 lb 12.8 oz (58.9 kg)    Physical Exam Vitals reviewed.  Constitutional:      General: She is not in acute distress.    Appearance: Normal appearance. She is not ill-appearing, toxic-appearing or diaphoretic.  HENT:     Head: Normocephalic  and atraumatic.     Right Ear: Tympanic membrane, ear canal and external ear normal. There is impacted cerumen.     Left Ear: Tympanic membrane, ear canal and external ear normal. There is impacted cerumen.     Nose: Nose normal. No congestion or rhinorrhea.     Mouth/Throat:     Mouth: Mucous membranes are moist.     Pharynx: Oropharynx is clear. No oropharyngeal exudate or posterior oropharyngeal erythema.  Eyes:     General: No scleral icterus.       Right eye: No discharge.        Left eye: No discharge.     Conjunctiva/sclera: Conjunctivae normal.     Pupils: Pupils are equal, round, and reactive to light.  Cardiovascular:     Rate and Rhythm: Normal rate. Rhythm irregular.     Heart sounds: Normal heart sounds. No murmur heard.   No friction rub. No gallop.  Pulmonary:     Effort: Pulmonary effort is  normal. No respiratory distress.     Breath sounds: Normal breath sounds. No stridor. No wheezing, rhonchi or rales.  Abdominal:     General: Abdomen is flat. Bowel sounds are normal. There is no distension.     Palpations: Abdomen is soft. There is no hepatomegaly, splenomegaly or mass.     Tenderness: There is no abdominal tenderness. There is no guarding or rebound.     Hernia: No hernia is present.  Musculoskeletal:        General: Normal range of motion.     Cervical back: Normal range of motion and neck supple. No rigidity. No muscular tenderness.  Lymphadenopathy:     Cervical: No cervical adenopathy.  Skin:    General: Skin is warm and dry.     Capillary Refill: Capillary refill takes less than 2 seconds.  Neurological:     General: No focal deficit present.     Mental Status: She is alert and oriented to person, place, and time. Mental status is at baseline.     Gait: Gait abnormal (riding in [facility]).  Psychiatric:        Mood and Affect: Mood normal.        Behavior: Behavior normal.        Thought Content: Thought content normal.        Judgment: Judgment normal.    Lab Results  Component Value Date   TSH 4.470 08/18/2020   Lab Results  Component Value Date   WBC 8.2 08/18/2020   HGB 11.8 08/18/2020   HCT 36.8 08/18/2020   MCV 91 08/18/2020   PLT 305 08/18/2020   Lab Results  Component Value Date   NA 145 (H) 08/18/2020   K 5.7 (H) 08/18/2020   CO2 22 08/18/2020   GLUCOSE 74 08/18/2020   BUN 35 (H) 08/18/2020   CREATININE 5.32 (HH) 08/18/2020   BILITOT 0.2 08/18/2020   ALKPHOS 93 08/18/2020   AST 20 08/18/2020   ALT 13 08/18/2020   PROT 6.5 08/18/2020   ALBUMIN 3.6 (L) 08/18/2020   CALCIUM 9.6 08/18/2020   ANIONGAP 10 07/17/2020   EGFR 8 (L) 08/18/2020   Lab Results  Component Value Date   CHOL 149 05/19/2020   Lab Results  Component Value Date   HDL 77 05/19/2020   Lab Results  Component Value Date   LDLCALC 59 05/19/2020   Lab Results   Component Value Date   TRIG 66 05/19/2020   Lab Results  Component Value  Date   CHOLHDL 1.9 05/19/2020   Lab Results  Component Value Date   HGBA1C 4.5 (L) 05/25/2017

## 2021-02-23 NOTE — Patient Instructions (Signed)
Purchase Debrox wax removal kit over the counter. Drops (3-4) should be instilled twice daily x3 days, then the ear flushed with warm water on the 4th day.  

## 2021-02-24 ENCOUNTER — Other Ambulatory Visit: Payer: Self-pay | Admitting: Family Medicine

## 2021-02-24 DIAGNOSIS — D631 Anemia in chronic kidney disease: Secondary | ICD-10-CM | POA: Diagnosis not present

## 2021-02-24 DIAGNOSIS — J9621 Acute and chronic respiratory failure with hypoxia: Secondary | ICD-10-CM | POA: Diagnosis not present

## 2021-02-24 DIAGNOSIS — M1990 Unspecified osteoarthritis, unspecified site: Secondary | ICD-10-CM | POA: Diagnosis not present

## 2021-02-24 DIAGNOSIS — D509 Iron deficiency anemia, unspecified: Secondary | ICD-10-CM | POA: Diagnosis not present

## 2021-02-24 DIAGNOSIS — E039 Hypothyroidism, unspecified: Secondary | ICD-10-CM

## 2021-02-24 DIAGNOSIS — N2581 Secondary hyperparathyroidism of renal origin: Secondary | ICD-10-CM | POA: Diagnosis not present

## 2021-02-24 DIAGNOSIS — Z4901 Encounter for fitting and adjustment of extracorporeal dialysis catheter: Secondary | ICD-10-CM | POA: Diagnosis not present

## 2021-02-24 DIAGNOSIS — N185 Chronic kidney disease, stage 5: Secondary | ICD-10-CM | POA: Diagnosis not present

## 2021-02-24 DIAGNOSIS — N186 End stage renal disease: Secondary | ICD-10-CM | POA: Diagnosis not present

## 2021-02-24 DIAGNOSIS — Z992 Dependence on renal dialysis: Secondary | ICD-10-CM | POA: Diagnosis not present

## 2021-02-24 DIAGNOSIS — I480 Paroxysmal atrial fibrillation: Secondary | ICD-10-CM | POA: Diagnosis not present

## 2021-02-24 DIAGNOSIS — D689 Coagulation defect, unspecified: Secondary | ICD-10-CM | POA: Diagnosis not present

## 2021-02-24 LAB — CBC WITH DIFFERENTIAL/PLATELET
Basophils Absolute: 0.1 10*3/uL (ref 0.0–0.2)
Basos: 1 %
EOS (ABSOLUTE): 0.2 10*3/uL (ref 0.0–0.4)
Eos: 2 %
Hematocrit: 34.2 % (ref 34.0–46.6)
Hemoglobin: 11.2 g/dL (ref 11.1–15.9)
Immature Grans (Abs): 0 10*3/uL (ref 0.0–0.1)
Immature Granulocytes: 0 %
Lymphocytes Absolute: 1 10*3/uL (ref 0.7–3.1)
Lymphs: 12 %
MCH: 29.5 pg (ref 26.6–33.0)
MCHC: 32.7 g/dL (ref 31.5–35.7)
MCV: 90 fL (ref 79–97)
Monocytes Absolute: 0.7 10*3/uL (ref 0.1–0.9)
Monocytes: 9 %
Neutrophils Absolute: 6.1 10*3/uL (ref 1.4–7.0)
Neutrophils: 76 %
Platelets: 326 10*3/uL (ref 150–450)
RBC: 3.8 x10E6/uL (ref 3.77–5.28)
RDW: 13.6 % (ref 11.7–15.4)
WBC: 8.1 10*3/uL (ref 3.4–10.8)

## 2021-02-24 LAB — LIPID PANEL
Chol/HDL Ratio: 2.1 ratio (ref 0.0–4.4)
Cholesterol, Total: 144 mg/dL (ref 100–199)
HDL: 68 mg/dL (ref >39)
LDL Chol Calc (NIH): 62 mg/dL (ref 0–99)
Triglycerides: 70 mg/dL (ref 0–149)
VLDL Cholesterol Cal: 14 mg/dL (ref 5–40)

## 2021-02-24 LAB — CMP14+EGFR
ALT: 16 IU/L (ref 0–32)
AST: 24 IU/L (ref 0–40)
Albumin/Globulin Ratio: 1.3 (ref 1.2–2.2)
Albumin: 3.6 g/dL — ABNORMAL LOW (ref 3.7–4.7)
Alkaline Phosphatase: 91 IU/L (ref 44–121)
BUN/Creatinine Ratio: 5 — ABNORMAL LOW (ref 12–28)
BUN: 19 mg/dL (ref 8–27)
Bilirubin Total: 0.3 mg/dL (ref 0.0–1.2)
CO2: 26 mmol/L (ref 20–29)
Calcium: 8.6 mg/dL — ABNORMAL LOW (ref 8.7–10.3)
Chloride: 99 mmol/L (ref 96–106)
Creatinine, Ser: 3.96 mg/dL (ref 0.57–1.00)
Globulin, Total: 2.8 g/dL (ref 1.5–4.5)
Glucose: 77 mg/dL (ref 70–99)
Potassium: 4.8 mmol/L (ref 3.5–5.2)
Sodium: 141 mmol/L (ref 134–144)
Total Protein: 6.4 g/dL (ref 6.0–8.5)
eGFR: 11 mL/min/{1.73_m2} — ABNORMAL LOW (ref >59)

## 2021-02-24 LAB — TSH: TSH: 7.11 u[IU]/mL — ABNORMAL HIGH (ref 0.450–4.500)

## 2021-02-24 LAB — T4, FREE: Free T4: 1.21 ng/dL (ref 0.82–1.77)

## 2021-02-24 MED ORDER — LEVOTHYROXINE SODIUM 125 MCG PO TABS: 125.0000 ug | ORAL_TABLET | Freq: Every day | ORAL | 2 refills | Status: DC

## 2021-02-27 DIAGNOSIS — Z992 Dependence on renal dialysis: Secondary | ICD-10-CM | POA: Diagnosis not present

## 2021-02-27 DIAGNOSIS — D689 Coagulation defect, unspecified: Secondary | ICD-10-CM | POA: Diagnosis not present

## 2021-02-27 DIAGNOSIS — D631 Anemia in chronic kidney disease: Secondary | ICD-10-CM | POA: Diagnosis not present

## 2021-02-27 DIAGNOSIS — Z4901 Encounter for fitting and adjustment of extracorporeal dialysis catheter: Secondary | ICD-10-CM | POA: Diagnosis not present

## 2021-02-27 DIAGNOSIS — N2581 Secondary hyperparathyroidism of renal origin: Secondary | ICD-10-CM | POA: Diagnosis not present

## 2021-02-27 DIAGNOSIS — N186 End stage renal disease: Secondary | ICD-10-CM | POA: Diagnosis not present

## 2021-02-27 DIAGNOSIS — D509 Iron deficiency anemia, unspecified: Secondary | ICD-10-CM | POA: Diagnosis not present

## 2021-02-27 DIAGNOSIS — I480 Paroxysmal atrial fibrillation: Secondary | ICD-10-CM | POA: Diagnosis not present

## 2021-03-01 DIAGNOSIS — D509 Iron deficiency anemia, unspecified: Secondary | ICD-10-CM | POA: Diagnosis not present

## 2021-03-01 DIAGNOSIS — D631 Anemia in chronic kidney disease: Secondary | ICD-10-CM | POA: Diagnosis not present

## 2021-03-01 DIAGNOSIS — Z992 Dependence on renal dialysis: Secondary | ICD-10-CM | POA: Diagnosis not present

## 2021-03-01 DIAGNOSIS — N186 End stage renal disease: Secondary | ICD-10-CM | POA: Diagnosis not present

## 2021-03-01 DIAGNOSIS — Z4901 Encounter for fitting and adjustment of extracorporeal dialysis catheter: Secondary | ICD-10-CM | POA: Diagnosis not present

## 2021-03-01 DIAGNOSIS — N2581 Secondary hyperparathyroidism of renal origin: Secondary | ICD-10-CM | POA: Diagnosis not present

## 2021-03-01 DIAGNOSIS — D689 Coagulation defect, unspecified: Secondary | ICD-10-CM | POA: Diagnosis not present

## 2021-03-03 DIAGNOSIS — D631 Anemia in chronic kidney disease: Secondary | ICD-10-CM | POA: Diagnosis not present

## 2021-03-03 DIAGNOSIS — D689 Coagulation defect, unspecified: Secondary | ICD-10-CM | POA: Diagnosis not present

## 2021-03-03 DIAGNOSIS — N186 End stage renal disease: Secondary | ICD-10-CM | POA: Diagnosis not present

## 2021-03-03 DIAGNOSIS — D509 Iron deficiency anemia, unspecified: Secondary | ICD-10-CM | POA: Diagnosis not present

## 2021-03-03 DIAGNOSIS — E1122 Type 2 diabetes mellitus with diabetic chronic kidney disease: Secondary | ICD-10-CM | POA: Diagnosis not present

## 2021-03-03 DIAGNOSIS — Z4901 Encounter for fitting and adjustment of extracorporeal dialysis catheter: Secondary | ICD-10-CM | POA: Diagnosis not present

## 2021-03-03 DIAGNOSIS — N2581 Secondary hyperparathyroidism of renal origin: Secondary | ICD-10-CM | POA: Diagnosis not present

## 2021-03-03 DIAGNOSIS — Z992 Dependence on renal dialysis: Secondary | ICD-10-CM | POA: Diagnosis not present

## 2021-03-05 DIAGNOSIS — D509 Iron deficiency anemia, unspecified: Secondary | ICD-10-CM | POA: Diagnosis not present

## 2021-03-05 DIAGNOSIS — R519 Headache, unspecified: Secondary | ICD-10-CM | POA: Diagnosis not present

## 2021-03-05 DIAGNOSIS — Z992 Dependence on renal dialysis: Secondary | ICD-10-CM | POA: Diagnosis not present

## 2021-03-05 DIAGNOSIS — N2581 Secondary hyperparathyroidism of renal origin: Secondary | ICD-10-CM | POA: Diagnosis not present

## 2021-03-05 DIAGNOSIS — Z4901 Encounter for fitting and adjustment of extracorporeal dialysis catheter: Secondary | ICD-10-CM | POA: Diagnosis not present

## 2021-03-05 DIAGNOSIS — D689 Coagulation defect, unspecified: Secondary | ICD-10-CM | POA: Diagnosis not present

## 2021-03-05 DIAGNOSIS — N186 End stage renal disease: Secondary | ICD-10-CM | POA: Diagnosis not present

## 2021-03-05 DIAGNOSIS — D631 Anemia in chronic kidney disease: Secondary | ICD-10-CM | POA: Diagnosis not present

## 2021-03-08 DIAGNOSIS — D631 Anemia in chronic kidney disease: Secondary | ICD-10-CM | POA: Diagnosis not present

## 2021-03-08 DIAGNOSIS — R519 Headache, unspecified: Secondary | ICD-10-CM | POA: Diagnosis not present

## 2021-03-08 DIAGNOSIS — Z4901 Encounter for fitting and adjustment of extracorporeal dialysis catheter: Secondary | ICD-10-CM | POA: Diagnosis not present

## 2021-03-08 DIAGNOSIS — Z992 Dependence on renal dialysis: Secondary | ICD-10-CM | POA: Diagnosis not present

## 2021-03-08 DIAGNOSIS — N2581 Secondary hyperparathyroidism of renal origin: Secondary | ICD-10-CM | POA: Diagnosis not present

## 2021-03-08 DIAGNOSIS — D509 Iron deficiency anemia, unspecified: Secondary | ICD-10-CM | POA: Diagnosis not present

## 2021-03-08 DIAGNOSIS — N186 End stage renal disease: Secondary | ICD-10-CM | POA: Diagnosis not present

## 2021-03-08 DIAGNOSIS — D689 Coagulation defect, unspecified: Secondary | ICD-10-CM | POA: Diagnosis not present

## 2021-03-10 DIAGNOSIS — D509 Iron deficiency anemia, unspecified: Secondary | ICD-10-CM | POA: Diagnosis not present

## 2021-03-10 DIAGNOSIS — D631 Anemia in chronic kidney disease: Secondary | ICD-10-CM | POA: Diagnosis not present

## 2021-03-10 DIAGNOSIS — R519 Headache, unspecified: Secondary | ICD-10-CM | POA: Diagnosis not present

## 2021-03-10 DIAGNOSIS — D689 Coagulation defect, unspecified: Secondary | ICD-10-CM | POA: Diagnosis not present

## 2021-03-10 DIAGNOSIS — Z4901 Encounter for fitting and adjustment of extracorporeal dialysis catheter: Secondary | ICD-10-CM | POA: Diagnosis not present

## 2021-03-10 DIAGNOSIS — Z992 Dependence on renal dialysis: Secondary | ICD-10-CM | POA: Diagnosis not present

## 2021-03-10 DIAGNOSIS — N2581 Secondary hyperparathyroidism of renal origin: Secondary | ICD-10-CM | POA: Diagnosis not present

## 2021-03-10 DIAGNOSIS — N186 End stage renal disease: Secondary | ICD-10-CM | POA: Diagnosis not present

## 2021-03-12 DIAGNOSIS — D509 Iron deficiency anemia, unspecified: Secondary | ICD-10-CM | POA: Diagnosis not present

## 2021-03-12 DIAGNOSIS — D631 Anemia in chronic kidney disease: Secondary | ICD-10-CM | POA: Diagnosis not present

## 2021-03-12 DIAGNOSIS — R519 Headache, unspecified: Secondary | ICD-10-CM | POA: Diagnosis not present

## 2021-03-12 DIAGNOSIS — D689 Coagulation defect, unspecified: Secondary | ICD-10-CM | POA: Diagnosis not present

## 2021-03-12 DIAGNOSIS — N186 End stage renal disease: Secondary | ICD-10-CM | POA: Diagnosis not present

## 2021-03-12 DIAGNOSIS — N2581 Secondary hyperparathyroidism of renal origin: Secondary | ICD-10-CM | POA: Diagnosis not present

## 2021-03-12 DIAGNOSIS — Z992 Dependence on renal dialysis: Secondary | ICD-10-CM | POA: Diagnosis not present

## 2021-03-12 DIAGNOSIS — Z4901 Encounter for fitting and adjustment of extracorporeal dialysis catheter: Secondary | ICD-10-CM | POA: Diagnosis not present

## 2021-03-15 DIAGNOSIS — R519 Headache, unspecified: Secondary | ICD-10-CM | POA: Diagnosis not present

## 2021-03-15 DIAGNOSIS — D631 Anemia in chronic kidney disease: Secondary | ICD-10-CM | POA: Diagnosis not present

## 2021-03-15 DIAGNOSIS — D689 Coagulation defect, unspecified: Secondary | ICD-10-CM | POA: Diagnosis not present

## 2021-03-15 DIAGNOSIS — N2581 Secondary hyperparathyroidism of renal origin: Secondary | ICD-10-CM | POA: Diagnosis not present

## 2021-03-15 DIAGNOSIS — Z992 Dependence on renal dialysis: Secondary | ICD-10-CM | POA: Diagnosis not present

## 2021-03-15 DIAGNOSIS — Z4901 Encounter for fitting and adjustment of extracorporeal dialysis catheter: Secondary | ICD-10-CM | POA: Diagnosis not present

## 2021-03-15 DIAGNOSIS — N186 End stage renal disease: Secondary | ICD-10-CM | POA: Diagnosis not present

## 2021-03-15 DIAGNOSIS — D509 Iron deficiency anemia, unspecified: Secondary | ICD-10-CM | POA: Diagnosis not present

## 2021-03-17 DIAGNOSIS — Z4901 Encounter for fitting and adjustment of extracorporeal dialysis catheter: Secondary | ICD-10-CM | POA: Diagnosis not present

## 2021-03-17 DIAGNOSIS — N2581 Secondary hyperparathyroidism of renal origin: Secondary | ICD-10-CM | POA: Diagnosis not present

## 2021-03-17 DIAGNOSIS — D509 Iron deficiency anemia, unspecified: Secondary | ICD-10-CM | POA: Diagnosis not present

## 2021-03-17 DIAGNOSIS — D631 Anemia in chronic kidney disease: Secondary | ICD-10-CM | POA: Diagnosis not present

## 2021-03-17 DIAGNOSIS — R519 Headache, unspecified: Secondary | ICD-10-CM | POA: Diagnosis not present

## 2021-03-17 DIAGNOSIS — N186 End stage renal disease: Secondary | ICD-10-CM | POA: Diagnosis not present

## 2021-03-17 DIAGNOSIS — D689 Coagulation defect, unspecified: Secondary | ICD-10-CM | POA: Diagnosis not present

## 2021-03-17 DIAGNOSIS — Z992 Dependence on renal dialysis: Secondary | ICD-10-CM | POA: Diagnosis not present

## 2021-03-19 DIAGNOSIS — Z4901 Encounter for fitting and adjustment of extracorporeal dialysis catheter: Secondary | ICD-10-CM | POA: Diagnosis not present

## 2021-03-19 DIAGNOSIS — D631 Anemia in chronic kidney disease: Secondary | ICD-10-CM | POA: Diagnosis not present

## 2021-03-19 DIAGNOSIS — R519 Headache, unspecified: Secondary | ICD-10-CM | POA: Diagnosis not present

## 2021-03-19 DIAGNOSIS — D509 Iron deficiency anemia, unspecified: Secondary | ICD-10-CM | POA: Diagnosis not present

## 2021-03-19 DIAGNOSIS — N2581 Secondary hyperparathyroidism of renal origin: Secondary | ICD-10-CM | POA: Diagnosis not present

## 2021-03-19 DIAGNOSIS — N186 End stage renal disease: Secondary | ICD-10-CM | POA: Diagnosis not present

## 2021-03-19 DIAGNOSIS — Z992 Dependence on renal dialysis: Secondary | ICD-10-CM | POA: Diagnosis not present

## 2021-03-19 DIAGNOSIS — D689 Coagulation defect, unspecified: Secondary | ICD-10-CM | POA: Diagnosis not present

## 2021-03-20 ENCOUNTER — Other Ambulatory Visit: Payer: Self-pay | Admitting: Family Medicine

## 2021-03-20 DIAGNOSIS — F331 Major depressive disorder, recurrent, moderate: Secondary | ICD-10-CM

## 2021-03-20 DIAGNOSIS — F419 Anxiety disorder, unspecified: Secondary | ICD-10-CM

## 2021-03-22 DIAGNOSIS — N2581 Secondary hyperparathyroidism of renal origin: Secondary | ICD-10-CM | POA: Diagnosis not present

## 2021-03-22 DIAGNOSIS — D509 Iron deficiency anemia, unspecified: Secondary | ICD-10-CM | POA: Diagnosis not present

## 2021-03-22 DIAGNOSIS — Z4901 Encounter for fitting and adjustment of extracorporeal dialysis catheter: Secondary | ICD-10-CM | POA: Diagnosis not present

## 2021-03-22 DIAGNOSIS — Z992 Dependence on renal dialysis: Secondary | ICD-10-CM | POA: Diagnosis not present

## 2021-03-22 DIAGNOSIS — D631 Anemia in chronic kidney disease: Secondary | ICD-10-CM | POA: Diagnosis not present

## 2021-03-22 DIAGNOSIS — R519 Headache, unspecified: Secondary | ICD-10-CM | POA: Diagnosis not present

## 2021-03-22 DIAGNOSIS — N186 End stage renal disease: Secondary | ICD-10-CM | POA: Diagnosis not present

## 2021-03-22 DIAGNOSIS — D689 Coagulation defect, unspecified: Secondary | ICD-10-CM | POA: Diagnosis not present

## 2021-03-24 DIAGNOSIS — R519 Headache, unspecified: Secondary | ICD-10-CM | POA: Diagnosis not present

## 2021-03-24 DIAGNOSIS — Z4901 Encounter for fitting and adjustment of extracorporeal dialysis catheter: Secondary | ICD-10-CM | POA: Diagnosis not present

## 2021-03-24 DIAGNOSIS — N186 End stage renal disease: Secondary | ICD-10-CM | POA: Diagnosis not present

## 2021-03-24 DIAGNOSIS — N2581 Secondary hyperparathyroidism of renal origin: Secondary | ICD-10-CM | POA: Diagnosis not present

## 2021-03-24 DIAGNOSIS — Z992 Dependence on renal dialysis: Secondary | ICD-10-CM | POA: Diagnosis not present

## 2021-03-24 DIAGNOSIS — D631 Anemia in chronic kidney disease: Secondary | ICD-10-CM | POA: Diagnosis not present

## 2021-03-24 DIAGNOSIS — D509 Iron deficiency anemia, unspecified: Secondary | ICD-10-CM | POA: Diagnosis not present

## 2021-03-24 DIAGNOSIS — D689 Coagulation defect, unspecified: Secondary | ICD-10-CM | POA: Diagnosis not present

## 2021-03-26 DIAGNOSIS — J9621 Acute and chronic respiratory failure with hypoxia: Secondary | ICD-10-CM | POA: Diagnosis not present

## 2021-03-26 DIAGNOSIS — Z4901 Encounter for fitting and adjustment of extracorporeal dialysis catheter: Secondary | ICD-10-CM | POA: Diagnosis not present

## 2021-03-26 DIAGNOSIS — N185 Chronic kidney disease, stage 5: Secondary | ICD-10-CM | POA: Diagnosis not present

## 2021-03-26 DIAGNOSIS — Z992 Dependence on renal dialysis: Secondary | ICD-10-CM | POA: Diagnosis not present

## 2021-03-26 DIAGNOSIS — D631 Anemia in chronic kidney disease: Secondary | ICD-10-CM | POA: Diagnosis not present

## 2021-03-26 DIAGNOSIS — D509 Iron deficiency anemia, unspecified: Secondary | ICD-10-CM | POA: Diagnosis not present

## 2021-03-26 DIAGNOSIS — N186 End stage renal disease: Secondary | ICD-10-CM | POA: Diagnosis not present

## 2021-03-26 DIAGNOSIS — I480 Paroxysmal atrial fibrillation: Secondary | ICD-10-CM | POA: Diagnosis not present

## 2021-03-26 DIAGNOSIS — D689 Coagulation defect, unspecified: Secondary | ICD-10-CM | POA: Diagnosis not present

## 2021-03-26 DIAGNOSIS — R519 Headache, unspecified: Secondary | ICD-10-CM | POA: Diagnosis not present

## 2021-03-26 DIAGNOSIS — M1990 Unspecified osteoarthritis, unspecified site: Secondary | ICD-10-CM | POA: Diagnosis not present

## 2021-03-26 DIAGNOSIS — N2581 Secondary hyperparathyroidism of renal origin: Secondary | ICD-10-CM | POA: Diagnosis not present

## 2021-03-29 DIAGNOSIS — N2581 Secondary hyperparathyroidism of renal origin: Secondary | ICD-10-CM | POA: Diagnosis not present

## 2021-03-29 DIAGNOSIS — Z992 Dependence on renal dialysis: Secondary | ICD-10-CM | POA: Diagnosis not present

## 2021-03-29 DIAGNOSIS — R519 Headache, unspecified: Secondary | ICD-10-CM | POA: Diagnosis not present

## 2021-03-29 DIAGNOSIS — D509 Iron deficiency anemia, unspecified: Secondary | ICD-10-CM | POA: Diagnosis not present

## 2021-03-29 DIAGNOSIS — I480 Paroxysmal atrial fibrillation: Secondary | ICD-10-CM | POA: Diagnosis not present

## 2021-03-29 DIAGNOSIS — D631 Anemia in chronic kidney disease: Secondary | ICD-10-CM | POA: Diagnosis not present

## 2021-03-29 DIAGNOSIS — D689 Coagulation defect, unspecified: Secondary | ICD-10-CM | POA: Diagnosis not present

## 2021-03-29 DIAGNOSIS — Z4901 Encounter for fitting and adjustment of extracorporeal dialysis catheter: Secondary | ICD-10-CM | POA: Diagnosis not present

## 2021-03-29 DIAGNOSIS — N186 End stage renal disease: Secondary | ICD-10-CM | POA: Diagnosis not present

## 2021-03-31 DIAGNOSIS — D689 Coagulation defect, unspecified: Secondary | ICD-10-CM | POA: Diagnosis not present

## 2021-03-31 DIAGNOSIS — Z992 Dependence on renal dialysis: Secondary | ICD-10-CM | POA: Diagnosis not present

## 2021-03-31 DIAGNOSIS — Z4901 Encounter for fitting and adjustment of extracorporeal dialysis catheter: Secondary | ICD-10-CM | POA: Diagnosis not present

## 2021-03-31 DIAGNOSIS — N186 End stage renal disease: Secondary | ICD-10-CM | POA: Diagnosis not present

## 2021-03-31 DIAGNOSIS — D509 Iron deficiency anemia, unspecified: Secondary | ICD-10-CM | POA: Diagnosis not present

## 2021-03-31 DIAGNOSIS — D631 Anemia in chronic kidney disease: Secondary | ICD-10-CM | POA: Diagnosis not present

## 2021-03-31 DIAGNOSIS — N2581 Secondary hyperparathyroidism of renal origin: Secondary | ICD-10-CM | POA: Diagnosis not present

## 2021-03-31 DIAGNOSIS — R519 Headache, unspecified: Secondary | ICD-10-CM | POA: Diagnosis not present

## 2021-04-02 DIAGNOSIS — D631 Anemia in chronic kidney disease: Secondary | ICD-10-CM | POA: Diagnosis not present

## 2021-04-02 DIAGNOSIS — D689 Coagulation defect, unspecified: Secondary | ICD-10-CM | POA: Diagnosis not present

## 2021-04-02 DIAGNOSIS — D509 Iron deficiency anemia, unspecified: Secondary | ICD-10-CM | POA: Diagnosis not present

## 2021-04-02 DIAGNOSIS — N186 End stage renal disease: Secondary | ICD-10-CM | POA: Diagnosis not present

## 2021-04-02 DIAGNOSIS — Z992 Dependence on renal dialysis: Secondary | ICD-10-CM | POA: Diagnosis not present

## 2021-04-02 DIAGNOSIS — Z4901 Encounter for fitting and adjustment of extracorporeal dialysis catheter: Secondary | ICD-10-CM | POA: Diagnosis not present

## 2021-04-02 DIAGNOSIS — R519 Headache, unspecified: Secondary | ICD-10-CM | POA: Diagnosis not present

## 2021-04-02 DIAGNOSIS — N2581 Secondary hyperparathyroidism of renal origin: Secondary | ICD-10-CM | POA: Diagnosis not present

## 2021-04-03 ENCOUNTER — Other Ambulatory Visit: Payer: Self-pay | Admitting: Family Medicine

## 2021-04-03 DIAGNOSIS — E039 Hypothyroidism, unspecified: Secondary | ICD-10-CM

## 2021-04-03 DIAGNOSIS — E785 Hyperlipidemia, unspecified: Secondary | ICD-10-CM

## 2021-04-03 DIAGNOSIS — G8929 Other chronic pain: Secondary | ICD-10-CM

## 2021-04-03 DIAGNOSIS — N186 End stage renal disease: Secondary | ICD-10-CM | POA: Diagnosis not present

## 2021-04-03 DIAGNOSIS — L299 Pruritus, unspecified: Secondary | ICD-10-CM

## 2021-04-03 DIAGNOSIS — E1122 Type 2 diabetes mellitus with diabetic chronic kidney disease: Secondary | ICD-10-CM | POA: Diagnosis not present

## 2021-04-03 DIAGNOSIS — I10 Essential (primary) hypertension: Secondary | ICD-10-CM

## 2021-04-03 DIAGNOSIS — Z992 Dependence on renal dialysis: Secondary | ICD-10-CM | POA: Diagnosis not present

## 2021-04-05 ENCOUNTER — Inpatient Hospital Stay (HOSPITAL_COMMUNITY)
Admission: EM | Admit: 2021-04-05 | Discharge: 2021-04-10 | DRG: 314 | Disposition: A | Discharge status: Home / Self Care | Payer: Medicare Other | Attending: Internal Medicine | Admitting: Internal Medicine

## 2021-04-05 ENCOUNTER — Encounter (HOSPITAL_COMMUNITY): Payer: Self-pay

## 2021-04-05 ENCOUNTER — Encounter (HOSPITAL_COMMUNITY): Payer: Self-pay | Admitting: *Deleted

## 2021-04-05 ENCOUNTER — Other Ambulatory Visit: Payer: Self-pay

## 2021-04-05 ENCOUNTER — Emergency Department (HOSPITAL_COMMUNITY): Payer: Medicare Other

## 2021-04-05 ENCOUNTER — Inpatient Hospital Stay (HOSPITAL_COMMUNITY): Payer: Medicare Other

## 2021-04-05 DIAGNOSIS — Z96611 Presence of right artificial shoulder joint: Secondary | ICD-10-CM | POA: Diagnosis present

## 2021-04-05 DIAGNOSIS — J449 Chronic obstructive pulmonary disease, unspecified: Secondary | ICD-10-CM | POA: Diagnosis present

## 2021-04-05 DIAGNOSIS — F419 Anxiety disorder, unspecified: Secondary | ICD-10-CM | POA: Diagnosis present

## 2021-04-05 DIAGNOSIS — E1122 Type 2 diabetes mellitus with diabetic chronic kidney disease: Secondary | ICD-10-CM | POA: Diagnosis present

## 2021-04-05 DIAGNOSIS — Z880 Allergy status to penicillin: Secondary | ICD-10-CM

## 2021-04-05 DIAGNOSIS — Z66 Do not resuscitate: Secondary | ICD-10-CM | POA: Diagnosis present

## 2021-04-05 DIAGNOSIS — H9192 Unspecified hearing loss, left ear: Secondary | ICD-10-CM | POA: Diagnosis present

## 2021-04-05 DIAGNOSIS — E1121 Type 2 diabetes mellitus with diabetic nephropathy: Secondary | ICD-10-CM | POA: Diagnosis not present

## 2021-04-05 DIAGNOSIS — Z87891 Personal history of nicotine dependence: Secondary | ICD-10-CM

## 2021-04-05 DIAGNOSIS — N186 End stage renal disease: Secondary | ICD-10-CM

## 2021-04-05 DIAGNOSIS — F32A Depression, unspecified: Secondary | ICD-10-CM | POA: Diagnosis not present

## 2021-04-05 DIAGNOSIS — T827XXA Infection and inflammatory reaction due to other cardiac and vascular devices, implants and grafts, initial encounter: Secondary | ICD-10-CM | POA: Diagnosis not present

## 2021-04-05 DIAGNOSIS — I120 Hypertensive chronic kidney disease with stage 5 chronic kidney disease or end stage renal disease: Secondary | ICD-10-CM | POA: Diagnosis present

## 2021-04-05 DIAGNOSIS — I499 Cardiac arrhythmia, unspecified: Secondary | ICD-10-CM | POA: Diagnosis not present

## 2021-04-05 DIAGNOSIS — R778 Other specified abnormalities of plasma proteins: Secondary | ICD-10-CM | POA: Diagnosis present

## 2021-04-05 DIAGNOSIS — E11649 Type 2 diabetes mellitus with hypoglycemia without coma: Secondary | ICD-10-CM | POA: Diagnosis not present

## 2021-04-05 DIAGNOSIS — R404 Transient alteration of awareness: Secondary | ICD-10-CM | POA: Diagnosis not present

## 2021-04-05 DIAGNOSIS — D689 Coagulation defect, unspecified: Secondary | ICD-10-CM | POA: Diagnosis not present

## 2021-04-05 DIAGNOSIS — E039 Hypothyroidism, unspecified: Secondary | ICD-10-CM | POA: Diagnosis present

## 2021-04-05 DIAGNOSIS — Z7983 Long term (current) use of bisphosphonates: Secondary | ICD-10-CM

## 2021-04-05 DIAGNOSIS — Z79899 Other long term (current) drug therapy: Secondary | ICD-10-CM

## 2021-04-05 DIAGNOSIS — Z743 Need for continuous supervision: Secondary | ICD-10-CM | POA: Diagnosis not present

## 2021-04-05 DIAGNOSIS — Z8261 Family history of arthritis: Secondary | ICD-10-CM | POA: Diagnosis not present

## 2021-04-05 DIAGNOSIS — I517 Cardiomegaly: Secondary | ICD-10-CM | POA: Diagnosis not present

## 2021-04-05 DIAGNOSIS — D631 Anemia in chronic kidney disease: Secondary | ICD-10-CM | POA: Diagnosis not present

## 2021-04-05 DIAGNOSIS — Z992 Dependence on renal dialysis: Secondary | ICD-10-CM | POA: Diagnosis not present

## 2021-04-05 DIAGNOSIS — R059 Cough, unspecified: Secondary | ICD-10-CM | POA: Diagnosis not present

## 2021-04-05 DIAGNOSIS — J1282 Pneumonia due to coronavirus disease 2019: Secondary | ICD-10-CM | POA: Diagnosis present

## 2021-04-05 DIAGNOSIS — Z823 Family history of stroke: Secondary | ICD-10-CM | POA: Diagnosis not present

## 2021-04-05 DIAGNOSIS — U071 COVID-19: Secondary | ICD-10-CM | POA: Diagnosis not present

## 2021-04-05 DIAGNOSIS — L299 Pruritus, unspecified: Secondary | ICD-10-CM

## 2021-04-05 DIAGNOSIS — Z96612 Presence of left artificial shoulder joint: Secondary | ICD-10-CM | POA: Diagnosis present

## 2021-04-05 DIAGNOSIS — R519 Headache, unspecified: Secondary | ICD-10-CM | POA: Diagnosis not present

## 2021-04-05 DIAGNOSIS — I480 Paroxysmal atrial fibrillation: Secondary | ICD-10-CM | POA: Diagnosis not present

## 2021-04-05 DIAGNOSIS — I248 Other forms of acute ischemic heart disease: Secondary | ICD-10-CM | POA: Diagnosis present

## 2021-04-05 DIAGNOSIS — J440 Chronic obstructive pulmonary disease with acute lower respiratory infection: Secondary | ICD-10-CM | POA: Diagnosis present

## 2021-04-05 DIAGNOSIS — Z8249 Family history of ischemic heart disease and other diseases of the circulatory system: Secondary | ICD-10-CM | POA: Diagnosis not present

## 2021-04-05 DIAGNOSIS — A419 Sepsis, unspecified organism: Secondary | ICD-10-CM | POA: Diagnosis present

## 2021-04-05 DIAGNOSIS — N2581 Secondary hyperparathyroidism of renal origin: Secondary | ICD-10-CM | POA: Diagnosis not present

## 2021-04-05 DIAGNOSIS — N250 Renal osteodystrophy: Secondary | ICD-10-CM | POA: Diagnosis not present

## 2021-04-05 DIAGNOSIS — T80211A Bloodstream infection due to central venous catheter, initial encounter: Principal | ICD-10-CM | POA: Diagnosis present

## 2021-04-05 DIAGNOSIS — M898X9 Other specified disorders of bone, unspecified site: Secondary | ICD-10-CM | POA: Diagnosis not present

## 2021-04-05 DIAGNOSIS — Z8614 Personal history of Methicillin resistant Staphylococcus aureus infection: Secondary | ICD-10-CM

## 2021-04-05 DIAGNOSIS — N185 Chronic kidney disease, stage 5: Secondary | ICD-10-CM | POA: Diagnosis not present

## 2021-04-05 DIAGNOSIS — Y839 Surgical procedure, unspecified as the cause of abnormal reaction of the patient, or of later complication, without mention of misadventure at the time of the procedure: Secondary | ICD-10-CM | POA: Diagnosis present

## 2021-04-05 DIAGNOSIS — R6889 Other general symptoms and signs: Secondary | ICD-10-CM | POA: Diagnosis not present

## 2021-04-05 DIAGNOSIS — Z881 Allergy status to other antibiotic agents status: Secondary | ICD-10-CM

## 2021-04-05 DIAGNOSIS — Z833 Family history of diabetes mellitus: Secondary | ICD-10-CM

## 2021-04-05 DIAGNOSIS — R079 Chest pain, unspecified: Secondary | ICD-10-CM | POA: Diagnosis not present

## 2021-04-05 DIAGNOSIS — K219 Gastro-esophageal reflux disease without esophagitis: Secondary | ICD-10-CM | POA: Diagnosis present

## 2021-04-05 DIAGNOSIS — G8929 Other chronic pain: Secondary | ICD-10-CM | POA: Diagnosis present

## 2021-04-05 DIAGNOSIS — R0902 Hypoxemia: Secondary | ICD-10-CM | POA: Diagnosis not present

## 2021-04-05 DIAGNOSIS — Z888 Allergy status to other drugs, medicaments and biological substances status: Secondary | ICD-10-CM

## 2021-04-05 DIAGNOSIS — E785 Hyperlipidemia, unspecified: Secondary | ICD-10-CM | POA: Diagnosis not present

## 2021-04-05 DIAGNOSIS — Z79890 Hormone replacement therapy (postmenopausal): Secondary | ICD-10-CM

## 2021-04-05 DIAGNOSIS — A499 Bacterial infection, unspecified: Secondary | ICD-10-CM | POA: Diagnosis not present

## 2021-04-05 DIAGNOSIS — Z4901 Encounter for fitting and adjustment of extracorporeal dialysis catheter: Secondary | ICD-10-CM | POA: Diagnosis not present

## 2021-04-05 DIAGNOSIS — Z96653 Presence of artificial knee joint, bilateral: Secondary | ICD-10-CM | POA: Diagnosis present

## 2021-04-05 LAB — TROPONIN I (HIGH SENSITIVITY)
Troponin I (High Sensitivity): 202 ng/L (ref <18)
Troponin I (High Sensitivity): 209 ng/L (ref <18)

## 2021-04-05 LAB — COMPREHENSIVE METABOLIC PANEL
ALT: 20 U/L (ref 0–44)
AST: 34 U/L (ref 15–41)
Albumin: 2.8 g/dL — ABNORMAL LOW (ref 3.5–5.0)
Alkaline Phosphatase: 61 U/L (ref 38–126)
Anion gap: 12 (ref 5–15)
BUN: 32 mg/dL — ABNORMAL HIGH (ref 8–23)
CO2: 25 mmol/L (ref 22–32)
Calcium: 8.3 mg/dL — ABNORMAL LOW (ref 8.9–10.3)
Chloride: 102 mmol/L (ref 98–111)
Creatinine, Ser: 5.35 mg/dL — ABNORMAL HIGH (ref 0.44–1.00)
GFR, Estimated: 8 mL/min — ABNORMAL LOW (ref >60)
Glucose, Bld: 65 mg/dL — ABNORMAL LOW (ref 70–99)
Potassium: 4.1 mmol/L (ref 3.5–5.1)
Sodium: 139 mmol/L (ref 135–145)
Total Bilirubin: 1 mg/dL (ref 0.3–1.2)
Total Protein: 5.7 g/dL — ABNORMAL LOW (ref 6.5–8.1)

## 2021-04-05 LAB — CBC WITH DIFFERENTIAL/PLATELET
Abs Immature Granulocytes: 0.1 10*3/uL — ABNORMAL HIGH (ref 0.00–0.07)
Basophils Absolute: 0 10*3/uL (ref 0.0–0.1)
Basophils Relative: 0 %
Eosinophils Absolute: 0 10*3/uL (ref 0.0–0.5)
Eosinophils Relative: 0 %
HCT: 35.3 % — ABNORMAL LOW (ref 36.0–46.0)
Hemoglobin: 11.2 g/dL — ABNORMAL LOW (ref 12.0–15.0)
Immature Granulocytes: 1 %
Lymphocytes Relative: 2 %
Lymphs Abs: 0.4 10*3/uL — ABNORMAL LOW (ref 0.7–4.0)
MCH: 31.2 pg (ref 26.0–34.0)
MCHC: 31.7 g/dL (ref 30.0–36.0)
MCV: 98.3 fL (ref 80.0–100.0)
Monocytes Absolute: 1.1 10*3/uL — ABNORMAL HIGH (ref 0.1–1.0)
Monocytes Relative: 7 %
Neutro Abs: 14.2 10*3/uL — ABNORMAL HIGH (ref 1.7–7.7)
Neutrophils Relative %: 90 %
Platelets: 218 10*3/uL (ref 150–400)
RBC: 3.59 MIL/uL — ABNORMAL LOW (ref 3.87–5.11)
RDW: 16.2 % — ABNORMAL HIGH (ref 11.5–15.5)
WBC: 15.9 10*3/uL — ABNORMAL HIGH (ref 4.0–10.5)
nRBC: 0 % (ref 0.0–0.2)

## 2021-04-05 LAB — RESP PANEL BY RT-PCR (FLU A&B, COVID) ARPGX2
Influenza A by PCR: NEGATIVE
Influenza B by PCR: NEGATIVE
SARS Coronavirus 2 by RT PCR: POSITIVE — AB

## 2021-04-05 LAB — CBG MONITORING, ED
Glucose-Capillary: 86 mg/dL (ref 70–99)
Glucose-Capillary: 109 mg/dL — ABNORMAL HIGH (ref 70–99)

## 2021-04-05 LAB — FERRITIN: Ferritin: 1374 ng/mL — ABNORMAL HIGH (ref 11–307)

## 2021-04-05 LAB — LACTIC ACID, PLASMA
Lactic Acid, Venous: 2.1 mmol/L (ref 0.5–1.9)
Lactic Acid, Venous: 2.1 mmol/L (ref 0.5–1.9)

## 2021-04-05 LAB — LACTATE DEHYDROGENASE: LDH: 168 U/L (ref 98–192)

## 2021-04-05 LAB — D-DIMER, QUANTITATIVE: D-Dimer, Quant: 2.72 ug/mL-FEU — ABNORMAL HIGH (ref 0.00–0.50)

## 2021-04-05 LAB — GLUCOSE, CAPILLARY: Glucose-Capillary: 125 mg/dL — ABNORMAL HIGH (ref 70–99)

## 2021-04-05 LAB — TRIGLYCERIDES: Triglycerides: 41 mg/dL (ref <150)

## 2021-04-05 LAB — C-REACTIVE PROTEIN: CRP: 6.8 mg/dL — ABNORMAL HIGH (ref <1.0)

## 2021-04-05 LAB — TSH: TSH: 2.703 u[IU]/mL (ref 0.350–4.500)

## 2021-04-05 LAB — PROCALCITONIN: Procalcitonin: 8.21 ng/mL

## 2021-04-05 LAB — FIBRINOGEN: Fibrinogen: 486 mg/dL — ABNORMAL HIGH (ref 210–475)

## 2021-04-05 LAB — BRAIN NATRIURETIC PEPTIDE: B Natriuretic Peptide: 1839.5 pg/mL — ABNORMAL HIGH (ref 0.0–100.0)

## 2021-04-05 MED ORDER — FERRIC CITRATE 1 GM 210 MG(FE) PO TABS
420.0000 mg | ORAL_TABLET | Freq: Three times a day (TID) | ORAL | Status: DC
  Administered 2021-04-06 – 2021-04-08 (×5): 420 mg via ORAL
  Filled 2021-04-05 (×7): qty 2

## 2021-04-05 MED ORDER — PANTOPRAZOLE SODIUM 20 MG PO TBEC
20.0000 mg | DELAYED_RELEASE_TABLET | Freq: Every day | ORAL | Status: DC
  Administered 2021-04-05 – 2021-04-10 (×6): 20 mg via ORAL
  Filled 2021-04-05 (×7): qty 1

## 2021-04-05 MED ORDER — MOMETASONE FURO-FORMOTEROL FUM 100-5 MCG/ACT IN AERO
2.0000 | INHALATION_SPRAY | Freq: Two times a day (BID) | RESPIRATORY_TRACT | Status: DC
  Administered 2021-04-05 – 2021-04-09 (×7): 2 via RESPIRATORY_TRACT
  Filled 2021-04-05: qty 8.8

## 2021-04-05 MED ORDER — SODIUM CHLORIDE 0.9 % IV SOLN: INTRAVENOUS | Status: DC | PRN

## 2021-04-05 MED ORDER — HEPARIN SODIUM (PORCINE) 5000 UNIT/ML IJ SOLN
5000.0000 [IU] | Freq: Three times a day (TID) | INTRAMUSCULAR | Status: DC
  Administered 2021-04-05 – 2021-04-08 (×5): 5000 [IU] via SUBCUTANEOUS
  Filled 2021-04-05 (×7): qty 1

## 2021-04-05 MED ORDER — DOCUSATE SODIUM 100 MG PO CAPS
100.0000 mg | ORAL_CAPSULE | Freq: Every day | ORAL | Status: DC
  Administered 2021-04-05 – 2021-04-10 (×6): 100 mg via ORAL
  Filled 2021-04-05 (×6): qty 1

## 2021-04-05 MED ORDER — CITALOPRAM HYDROBROMIDE 40 MG PO TABS
40.0000 mg | ORAL_TABLET | Freq: Every day | ORAL | Status: DC
Start: 2021-04-05 — End: 2021-04-10
  Administered 2021-04-05 – 2021-04-10 (×5): 40 mg via ORAL
  Filled 2021-04-05 (×6): qty 1

## 2021-04-05 MED ORDER — ACETAMINOPHEN 325 MG PO TABS
650.0000 mg | ORAL_TABLET | Freq: Four times a day (QID) | ORAL | Status: DC | PRN
  Administered 2021-04-05: 650 mg via ORAL
  Filled 2021-04-05: qty 2

## 2021-04-05 MED ORDER — NYSTATIN 100000 UNIT/GM EX CREA
1.0000 "application " | TOPICAL_CREAM | Freq: Two times a day (BID) | CUTANEOUS | Status: DC | PRN
  Filled 2021-04-05: qty 15

## 2021-04-05 MED ORDER — LEVOTHYROXINE SODIUM 25 MCG PO TABS
125.0000 ug | ORAL_TABLET | Freq: Every day | ORAL | Status: DC
  Administered 2021-04-05 – 2021-04-10 (×5): 125 ug via ORAL
  Filled 2021-04-05 (×5): qty 1

## 2021-04-05 MED ORDER — DEXTROSE 5 % IV SOLN
0.5000 g | Freq: Two times a day (BID) | INTRAVENOUS | Status: DC
  Administered 2021-04-05 – 2021-04-06 (×3): 0.5 g via INTRAVENOUS
  Filled 2021-04-05 (×6): qty 0.5

## 2021-04-05 MED ORDER — VANCOMYCIN HCL 1250 MG/250ML IV SOLN
1250.0000 mg | Freq: Once | INTRAVENOUS | Status: AC
  Administered 2021-04-05: 1250 mg via INTRAVENOUS
  Filled 2021-04-05: qty 250

## 2021-04-05 MED ORDER — HYDRALAZINE HCL 10 MG PO TABS
10.0000 mg | ORAL_TABLET | Freq: Three times a day (TID) | ORAL | Status: DC
  Administered 2021-04-05 – 2021-04-09 (×10): 10 mg via ORAL
  Filled 2021-04-05 (×12): qty 1

## 2021-04-05 MED ORDER — CALCITRIOL 0.5 MCG PO CAPS
0.7500 ug | ORAL_CAPSULE | ORAL | Status: DC
  Administered 2021-04-07: 0.75 ug via ORAL
  Filled 2021-04-05: qty 1

## 2021-04-05 MED ORDER — IOHEXOL 350 MG/ML SOLN
70.0000 mL | Freq: Once | INTRAVENOUS | Status: AC | PRN
  Administered 2021-04-05: 70 mL via INTRAVENOUS

## 2021-04-05 MED ORDER — DEXTROSE 50 % IV SOLN: 1.0000 | INTRAVENOUS | Status: DC | PRN

## 2021-04-05 MED ORDER — HYDROCOD POLST-CPM POLST ER 10-8 MG/5ML PO SUER: 5.0000 mL | Freq: Two times a day (BID) | ORAL | Status: DC | PRN

## 2021-04-05 MED ORDER — SODIUM CHLORIDE 0.9% FLUSH
3.0000 mL | Freq: Two times a day (BID) | INTRAVENOUS | Status: DC
  Administered 2021-04-05 – 2021-04-09 (×10): 3 mL via INTRAVENOUS

## 2021-04-05 MED ORDER — CETIRIZINE HCL 10 MG PO TABS
5.0000 mg | ORAL_TABLET | Freq: Every evening | ORAL | Status: DC
  Administered 2021-04-05 – 2021-04-09 (×3): 5 mg via ORAL
  Filled 2021-04-05 (×7): qty 1

## 2021-04-05 MED ORDER — MOLNUPIRAVIR EUA 200MG CAPSULE
4.0000 | ORAL_CAPSULE | Freq: Two times a day (BID) | ORAL | Status: AC
  Administered 2021-04-05 – 2021-04-10 (×10): 800 mg via ORAL
  Filled 2021-04-05: qty 4

## 2021-04-05 MED ORDER — CINACALCET HCL 30 MG PO TABS
30.0000 mg | ORAL_TABLET | ORAL | Status: DC
  Administered 2021-04-05 – 2021-04-09 (×3): 30 mg via ORAL
  Filled 2021-04-05 (×4): qty 1

## 2021-04-05 MED ORDER — FAMOTIDINE 20 MG PO TABS
20.0000 mg | ORAL_TABLET | Freq: Two times a day (BID) | ORAL | Status: DC
  Administered 2021-04-05 – 2021-04-06 (×2): 20 mg via ORAL
  Filled 2021-04-05 (×2): qty 1

## 2021-04-05 MED ORDER — ASCORBIC ACID 500 MG PO TABS
500.0000 mg | ORAL_TABLET | Freq: Every day | ORAL | Status: DC
  Administered 2021-04-05 – 2021-04-10 (×6): 500 mg via ORAL
  Filled 2021-04-05 (×6): qty 1

## 2021-04-05 MED ORDER — IPRATROPIUM-ALBUTEROL 20-100 MCG/ACT IN AERS
1.0000 | INHALATION_SPRAY | Freq: Four times a day (QID) | RESPIRATORY_TRACT | Status: DC | PRN
  Filled 2021-04-05: qty 4

## 2021-04-05 MED ORDER — VANCOMYCIN HCL 500 MG/100ML IV SOLN
500.0000 mg | INTRAVENOUS | Status: DC
  Administered 2021-04-07 – 2021-04-10 (×2): 500 mg via INTRAVENOUS
  Filled 2021-04-05 (×3): qty 100

## 2021-04-05 MED ORDER — HYDROCODONE-ACETAMINOPHEN 7.5-325 MG PO TABS: 1.0000 | ORAL_TABLET | Freq: Four times a day (QID) | ORAL | Status: DC | PRN

## 2021-04-05 MED ORDER — EZETIMIBE 10 MG PO TABS
10.0000 mg | ORAL_TABLET | Freq: Every day | ORAL | Status: DC
  Administered 2021-04-05 – 2021-04-09 (×5): 10 mg via ORAL
  Filled 2021-04-05 (×6): qty 1

## 2021-04-05 MED ORDER — ZINC SULFATE 220 (50 ZN) MG PO CAPS
220.0000 mg | ORAL_CAPSULE | Freq: Every day | ORAL | Status: DC
  Administered 2021-04-05 – 2021-04-10 (×6): 220 mg via ORAL
  Filled 2021-04-05 (×6): qty 1

## 2021-04-05 MED ORDER — IPRATROPIUM-ALBUTEROL 20-100 MCG/ACT IN AERS
1.0000 | INHALATION_SPRAY | Freq: Four times a day (QID) | RESPIRATORY_TRACT | Status: DC
  Administered 2021-04-05: 1 via RESPIRATORY_TRACT
  Filled 2021-04-05: qty 4

## 2021-04-05 MED ORDER — RENA-VITE PO TABS
1.0000 | ORAL_TABLET | Freq: Every day | ORAL | Status: DC
  Administered 2021-04-05 – 2021-04-10 (×6): 1 via ORAL
  Filled 2021-04-05 (×7): qty 1

## 2021-04-05 MED ORDER — GUAIFENESIN-DM 100-10 MG/5ML PO SYRP
10.0000 mL | ORAL_SOLUTION | ORAL | Status: DC | PRN
  Administered 2021-04-05: 10 mL via ORAL
  Filled 2021-04-05: qty 10

## 2021-04-05 MED ORDER — PRAMIPEXOLE DIHYDROCHLORIDE 0.125 MG PO TABS
0.1250 mg | ORAL_TABLET | Freq: Every day | ORAL | Status: DC
  Administered 2021-04-05 – 2021-04-09 (×5): 0.125 mg via ORAL
  Filled 2021-04-05 (×6): qty 1

## 2021-04-05 NOTE — ED Notes (Signed)
EMS also reported onset of A-rib w/rates in the 80-120's during transport

## 2021-04-05 NOTE — Progress Notes (Signed)
Pharmacy Antibiotic Note  [name] is a 81 y.o. 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 Pharmacy has been consulted for Aztreonam and Vancomycin dosing. Pt with ESRD (MWF HD).  Pt with rash (immediate reaction of severe rash) to PCN. Has only received Ancef one time since then so will continue with Aztreonam for now.  Plan: Aztreonam 500 mg IV q12h Vancomycin  1250 mg now then 500 mg IV Q HD.  Will f/u HD schedule/tolerance, micro data, and pt's clinical condition Vanc levels prn   Height: 5\' 5"  (165.1 cm) Weight: 54.4 kg (120 lb) IBW/kg (Calculated) : 57  Temp (24hrs), Avg:100.5 F (38.1 C), Min:99.4 F (37.4 C), Max:101.5 F (38.6 C)  Recent Labs  Lab 04/05/21 0918  WBC 15.9*  CREATININE 5.35*    Estimated Creatinine Clearance: 7.2 mL/min (A) (by C-G formula based on SCr of 5.35 mg/dL (H)).    Allergies  Allergen Reactions   Amoxicillin Rash    Has patient had a PCN reaction causing immediate rash, facial/tongue/throat swelling, SOB or lightheadedness with hypotension:YES Has patient had a PCN reaction causing severe rash involving mucus membranes or skin necrosis: Yes Has patient had a PCN reaction that required hospitalization No Has patient had a PCN reaction occurring within the last 10 years: Yes If all of the above answers are "NO", then may proceed with Cephalosporin use.    Elemental Sulfur Hives   Sulfur Hives   Peg 3350-Electrolytes Other (See Comments)   Sulfa Drugs Cross Reactors Hives and Itching   Miralax [Polyethylene Glycol] Itching   Mixed Grasses    Percocet [Oxycodone-Acetaminophen] Itching and Rash    Did not happen last time she took it   Sulfa Antibiotics Hives    Antimicrobials this admission: 1/2 Aztreonam >>  1/2 Vancomycin >>   Microbiology results: 1/2 BCx:   Thank you for allowing pharmacy to be a part of this patients care.  Sherlon Handing, PharmD, BCPS Please see amion for complete  clinical pharmacist phone list 04/05/2021 10:16 AM

## 2021-04-05 NOTE — Plan of Care (Signed)
  Problem: Education: Goal: Knowledge of disease and its progression will improve Outcome: Progressing   Problem: Fluid Volume: Goal: Compliance with measures to maintain balanced fluid volume will improve Outcome: Progressing   Problem: Nutritional: Goal: Ability to make healthy dietary choices will improve Outcome: Progressing   Problem: Clinical Measurements: Goal: Complications related to the disease process, condition or treatment will be avoided or minimized Outcome: Progressing   

## 2021-04-05 NOTE — ED Notes (Signed)
Pt had 1 BM

## 2021-04-05 NOTE — ED Triage Notes (Signed)
PT BIB EMS after receiving approx 1 hr and 24min of dialysis tx when she began shaking and vomited. Pt's daughter wanted her to be checked out by ED. Pt also reports right sided pain w/productive cough for 2 days. EMS reports port access site looks like it could be infected.

## 2021-04-05 NOTE — Consult Note (Signed)
[facility] Renal Consultation Note  Requesting MD: Fuller Plan, MD Indication for Consultation:  ESRD  Chief complaint: nausea and shaking at HD  HPI:  [name] is a 81 y.o. female with a history of ESRD on HD, COPD, GERD, HTN, and p afib who had HD today and was sent to the ER from the HD unit.  She had 1 hour and 11 minutes of HD when she began shaking and vomited per charting.  Treatment was ended prematurely.  She was found to be covid positive.  Nephrology is consulted for assistance with management of HD.   Note per charting EMS reported Afib with rate of 80's to 120's during transport here.  There is concern about her catheter site as well - this has been red and her family is concerned about an infection.  CXR with no acute cardiopulmonary process.  She left at 53 kg today from HD and left at 51.5 kg her last treatment.  Spoke with her daughter in law at bedside who pt states is also her power of attorney.  She states that her breathing is doing ok.  Nausea and vomiting earlier today.  She does ok with her fluids.    PMHx:   Past Medical History:  Diagnosis Date   Acute respiratory failure ([place]) 05/2017   Anemia    Anxiety    Arthritis    COPD (chronic obstructive pulmonary disease) (HCC)    Depression    ESRD (end stage renal disease) (HCC)    dialysis MWF   GERD (gastroesophageal reflux disease)    Gout    History of blood transfusion    History of diabetes mellitus    "diet controlled"   HLD (hyperlipidemia)    HOH (hard of hearing)    left ear   Hypertension    hypotensive -since starting dialysis   Hypothyroidism    MRSA (methicillin resistant staph aureus) culture positive 06/01/2017   PAF (paroxysmal atrial fibrillation) ([place])    a. Echo 11/16:  Mild LVH, EF 55-60%, normal wall motion, MAC, mild MR, severe LAE (49 ml/m2), mild RVE, normal RVSF, mild RAE, mild TR, PASP 24 mmHg;  CHADS2-VASc: 4 >> Coumadin followed by PCP    Past Surgical  History:  Procedure Laterality Date   AV FISTULA PLACEMENT  04/03/2012   Procedure: ARTERIOVENOUS (AV) FISTULA CREATION;  Surgeon: Elam Dutch, MD;  Location: [place];  Service: Vascular;  Laterality: Left;  creation left brachial cephalic fistula    BIOPSY  08/21/2018   Procedure: BIOPSY;  Surgeon: Thornton Park, MD;  Location: [place];  Service: Gastroenterology;;   CARPAL TUNNEL RELEASE Bilateral    COLONOSCOPY W/ POLYPECTOMY     COLONOSCOPY WITH PROPOFOL N/A 08/21/2018   Procedure: COLONOSCOPY WITH PROPOFOL;  Surgeon: Thornton Park, MD;  Location: [place];  Service: Gastroenterology;  Laterality: N/A;   DILATION AND CURETTAGE OF UTERUS     ESOPHAGOGASTRODUODENOSCOPY (EGD) WITH PROPOFOL N/A 08/21/2018   Procedure: ESOPHAGOGASTRODUODENOSCOPY (EGD) WITH PROPOFOL;  Surgeon: Thornton Park, MD;  Location: [place];  Service: Gastroenterology;  Laterality: N/A;   EYE SURGERY Bilateral    bilateral cataract removal   HEMATOMA EVACUATION Left 05/14/2018   Procedure: Incision and Drainage of Left Arm Hematoma;  Surgeon: Elam Dutch, MD;  Location: [facility] OR;  Service: Vascular;  Laterality: Left;   Hemodialysis  catheter Right    IR FLUORO GUIDE CV LINE RIGHT  05/26/2017   IR REMOVAL TUN CV CATH W/O FL  05/24/2017   IR US GUIDE VASC ACCESS RIGHT  05/26/2017   LIGATION OF ARTERIOVENOUS  FISTULA Left 02/04/2015   Procedure: LIGATION OF BRACHIOCEPHALIC ARTERIOVENOUS  FISTULA;  Surgeon: Conrad Arma, MD;  Location: [place];  Service: Vascular;  Laterality: Left;   MULTIPLE TOOTH EXTRACTIONS     PARTIAL HIP ARTHROPLASTY     POLYPECTOMY  08/21/2018   Procedure: POLYPECTOMY;  Surgeon: Thornton Park, MD;  Location: [facility] ENDOSCOPY;  Service: Gastroenterology;;   PORTACATH PLACEMENT     REVERSE SHOULDER ARTHROPLASTY Left 01/22/2016   Procedure: LEFT REVERSE SHOULDER ARTHROPLASTY;  Surgeon: Netta Cedars, MD;  Location: [place];  Service: Orthopedics;  Laterality: Left;   SHOULDER  ARTHROSCOPY Bilateral    SPLIT NIGHT STUDY  07/26/2015   STERIOD INJECTION Right 01/22/2016   Procedure: RIGHT RING FINGER STEROID INJECTION;  Surgeon: Netta Cedars, MD;  Location: [place];  Service: Orthopedics;  Laterality: Right;   TEE WITHOUT CARDIOVERSION N/A 05/26/2017   Procedure: TRANSESOPHAGEAL ECHOCARDIOGRAM (TEE);  Surgeon: Lelon Perla, MD;  Location[facility] ENDOSCOPY;  Service: Cardiovascular;  Laterality: N/A;   THROMBECTOMY BRACHIAL ARTERY Left 02/06/2015   Procedure: EVACUATION OF LEFT ARM HEMATOMA;  Surgeon: Angelia Mould, MD;  Location: [facility] OR;  Service: Vascular;  Laterality: Left;   TOTAL KNEE ARTHROPLASTY Bilateral    TUBAL LIGATION      Family Hx:  Family History  Problem Relation Age of Onset   Arthritis Mother    Diabetes Father        before age 73   Heart disease Father    Diabetes Sister    Cancer Brother    Hyperlipidemia Daughter    Hypertension Daughter    Diabetes Daughter    Hypertension Son    Hyperlipidemia Son    Dementia Sister    Cancer Sister        Unknown   Other Sister        Died in car accident   Stroke Brother    Diabetes Daughter    Hypertension Daughter    Hyperlipidemia Daughter    Hepatitis C Son     Social History:  reports that she quit smoking about 23 years ago. Her smoking use included cigarettes. She has a 0.50 pack-year smoking history. She has never used smokeless tobacco. She reports that she does not drink alcohol and does not use drugs.  Allergies:  Allergies  Allergen Reactions   Amoxicillin Rash    Has patient had a PCN reaction causing immediate rash, facial/tongue/throat swelling, SOB or lightheadedness with hypotension:YES Has patient had a PCN reaction causing severe rash involving mucus membranes or skin necrosis: Yes Has patient had a PCN reaction that required hospitalization No Has patient had a PCN reaction occurring within the last 10 years: Yes If all of the above answers are "NO", then may  proceed with Cephalosporin use.    Elemental Sulfur Hives   Sulfur Hives   Peg 3350-Electrolytes Other (See Comments)   Sulfa Drugs Cross Reactors Hives and Itching   Miralax [Polyethylene Glycol] Itching   Mixed Grasses    Percocet [Oxycodone-Acetaminophen] Itching and Rash    Did not happen last time she took it   Sulfa Antibiotics Hives    Medications: Prior to Admission medications   Medication Sig Start Date End Date Taking? Authorizing Provider  acetaminophen (TYLENOL) 500 MG tablet Take 2 tablets (1,000 mg total) by mouth every 8 (eight) hours as needed for mild pain or headache. 11/02/18  Yes  [name], FNP  albuterol (VENTOLIN HFA) 108 (90 Base) MCG/ACT inhaler Inhale 2 puffs into the lungs every 6 (six) hours as needed for wheezing or shortness of breath. 05/19/20  Yes [name], FNP  alendronate (FOSAMAX) 70 MG tablet Take 1 tablet (70 mg total) by mouth every Thursday. 05/21/20  Yes [name], FNP  AURYXIA 1 GM 210 MG(Fe) tablet Take 420 mg by mouth 3 (three) times daily with meals. 06/18/20  Yes [provider]  budesonide-formoterol (SYMBICORT) 80-4.5 MCG/ACT inhaler Inhale 2 puffs into the lungs 2 (two) times daily. 05/19/20  Yes [name] F, FNP  cinacalcet (SENSIPAR) 30 MG tablet Take 1 tablet (30 mg total) by mouth every Monday, Wednesday, and Friday. 05/28/17  Yes Allie Bossier, MD  citalopram (CELEXA) 40 MG tablet TAKE 1 TABLET BY MOUTH EVERY DAY Patient taking differently: Take 40 mg by mouth daily. 03/22/21  Yes [name] F, FNP  docusate calcium (SURFAK) 240 MG capsule Take 1 capsule (240 mg total) by mouth daily. 11/19/20  Yes [name] F, FNP  doxercalciferol (HECTOROL) 4 MCG/2ML injection Inject 0.5 mLs (1 mcg total) into the vein every Monday, Wednesday, and Friday with hemodialysis. 05/29/17  Yes Allie Bossier, MD  ezetimibe (ZETIA) 10 MG tablet Take 1 tablet (10 mg total) by mouth daily. 05/19/20  Yes [name], FNP   famotidine (PEPCID) 20 MG tablet Take 1 tablet (20 mg total) by mouth 2 (two) times daily. 11/19/20  Yes [name] F, FNP  hydrALAZINE (APRESOLINE) 10 MG tablet Take 1 tablet (10 mg total) by mouth 3 (three) times daily. 02/23/21  Yes [name] F, FNP  HYDROcodone-acetaminophen (NORCO) 7.5-325 MG tablet Take 1 tablet by mouth every 6 (six) hours as needed for moderate pain. 04/24/21  Yes [name] F, FNP  levocetirizine (XYZAL) 5 MG tablet TAKE 1 TABLET BY MOUTH EVERY DAY IN THE EVENING Patient taking differently: Take 5 mg by mouth every evening. 11/26/20  Yes [name], FNP  levothyroxine (SYNTHROID) 125 MCG tablet Take 1 tablet (125 mcg total) by mouth daily. 02/24/21  Yes [name] F, FNP  multivitamin (RENA-VIT) TABS tablet Take 1 tablet by mouth daily.   Yes [provider]  mupirocin cream (BACTROBAN) 2 % Apply 1 application topically 2 (two) times daily. 01/01/21  Yes Ivy Lynn, NP  nystatin cream (MYCOSTATIN) Apply 1 application topically 2 (two) times daily as needed for dry skin. 08/18/20  Yes [name] F, FNP  pantoprazole (PROTONIX) 20 MG tablet Take 1 tablet (20 mg total) by mouth daily. 11/19/20  Yes [name] F, FNP  pramipexole (MIRAPEX) 0.125 MG tablet Take 1 tablet (0.125 mg total) by mouth at bedtime. 02/23/21  Yes [name] F, FNP  HYDROcodone-acetaminophen (NORCO) 7.5-325 MG tablet Take 1 tablet by mouth every 6 (six) hours as needed for moderate pain. Patient not taking: Reported on 04/05/2021 02/23/21   [name], FNP  HYDROcodone-acetaminophen (NORCO) 7.5-325 MG tablet Take 1 tablet by mouth every 6 (six) hours as needed for moderate pain. Patient not taking: Reported on 04/05/2021 03/25/21   [name], FNP   I have reviewed the patient's current and reported prior to admission medications.  Labs:  BMP Latest Ref Rng & Units 04/05/2021 02/23/2021 08/18/2020  Glucose 70 - 99 mg/dL 65(L) 77 74  BUN 8 - 23 mg/dL  32(H) 19 35(H)  Creatinine 0.44 - 1.00 mg/dL 5.35(H) 3.96(HH) 5.32(HH)  BUN/Creat Ratio  12 - 28 - 5(L) 7(L)  Sodium 135 - 145 mmol/L 139 141 145(H)  Potassium 3.5 - 5.1 mmol/L 4.1 4.8 5.7(H)  Chloride 98 - 111 mmol/L 102 99 103  CO2 22 - 32 mmol/L _0 Calcium 8.9 - 10.3 mg/dL 8.3(L) 8.6(L) 9.6    ROS:  Pertinent items noted in HPI and remainder of comprehensive ROS otherwise negative.  Physical Exam: Vitals:   04/05/21 1346 04/05/21 1445  BP:  107/69  Pulse:  86  Resp:  (!) 23  Temp: 99.4 F (37.4 C)   SpO2:  92%     General: elderly female in bed  HEENT: NCAT  Eyes: EOMI sclera anicteric Neck: supple trachea midline  Heart: S1S2 no rub Lungs:  clear and unlabored on room air  Abdomen:  soft/NT/ND Extremities: no edema no cyanosis or clubbing Skin: no rash on extremities exposed Neuro: alert and oriented x 3 provides hx and follows commands Access Left IJ tunn catheter; Exit site of catheter with erythema and does not appear secured  Outpatient HD orders:  [facility] 3.5 hours  MWF BF 400/ DF 500 EDW 52 kg 2 k/2.5 Ca Tunn catheter Calcitriol 0.75 mcg three times a week Last Rec'd mircera 50 mcg on 12/21 and this is no longer ordered  Assessment/Plan:  # ESRD  - HD per MWF schedule - received a partial treatment today  - Plan for next HD on 1/4, Wednesday   # Covid positive - therapies per primary team   # paroxysmal a fib - per primary team   # Anemia CKD  - Defer ESA - Hb 11.2 on admit  # Catheter exit site infection  - note on vanc and aztreonam per primary team  - blood cultures were ordered - will exchange her catheter - I have consulted IR  # metabolic bone disease  - continue calcitriol    Claudia Desanctis 04/05/2021, 4:46 PM

## 2021-04-05 NOTE — H&P (Signed)
History and Physical    [name] DZH:[id number] DOB: [birth year] DOA: 04/05/2021  Referring MD/NP/PA: Davonna Belling, MD PCP: [name], FNP  Patient coming from: Transfer from hemodialysis via EMS  Chief Complaint: Chills  I have personally briefly reviewed patient's old medical records in [place]   HPI: [name] is a 81 y.o. female with medical history significant of ESRD on HD, COPD, paroxysmal atrial fibrillation on aspirin, anemia of chronic disease, hypothyroidism, anxiety, and depression who presents after having severe chills with shakes while at dialysis.  She had complained of some chills this morning, but chronically cold natured so she thought nothing of it.  Patient has had a nonproductive cough that has gotten deeper over the last 2 days and complained of some right-sided chest pain especially with taking a deep breath. She had received an hour of hemodialysis prior to onset of chills and shakes.  Noted associated symptoms of nausea, vomiting x1 episode, redness of the hemodialysis catheter of her left chest wall, and joint pains.  Denies having any significant cough, shortness of breath, diarrhea, falls, or recent sick contacts to her knowledge.  Her daughter notes that patient had a similar infection of her hemodialysis catheter due to improper cleaning last year.  This is the third time that she has had COVID-19.  Daughter also notes that the patient has a long history of atrial fibrillation which she is just on aspirin after being taken off of anticoagulation 2 years ago by her cardiologist Dr. Percival Spanish do to bleeding.  In route with EMS patient was noted to be in atrial fibrillation with heart rate 80-120s.  ED Course: On admission into the emergency department patient was noted to beFebrile up to 101.5 F with respirations 11-22, and all other vital signs maintained.  Labs significant for WBC 15.9, hemoglobin 11.2, potassium 4.1, CO2 25, BUN 32, creatinine  5.35, glucose 65, and lactic acid 2.1.  COVID-19 screening was positive.  Chest x-ray obtained and no acute abnormality.  Reported to have erythema around the dialysis catheter which was thought to be a possible source of infection.  Nephrology has been consulted for need of hemodialysis.  The patient has been started on empiric antibiotics of vancomycin and azithromycin.  TRH called to admit.  Review of Systems  Constitutional:  Positive for chills. Negative for fever.  HENT:  Positive for hearing loss. Negative for sore throat.   Eyes:  Negative for photophobia and pain.  Respiratory:  Positive for cough. Negative for sputum production and shortness of breath.   Cardiovascular:  Positive for chest pain (Right-sided). Negative for leg swelling.  Gastrointestinal:  Negative for abdominal pain and vomiting.  Genitourinary:  Negative for dysuria.  Musculoskeletal:  Positive for joint pain. Negative for falls.  Skin:        Redness of the left chest wall hemodialysis cath  Neurological:  Negative for focal weakness and loss of consciousness.  Endo/Heme/Allergies:  Bruises/bleeds easily.  Psychiatric/Behavioral:  Negative for substance abuse.    Past Medical History:  Diagnosis Date   Acute respiratory failure ([facility]) 05/2017   Anemia    Anxiety    Arthritis    COPD (chronic obstructive pulmonary disease) (HCC)    Depression    ESRD (end stage renal disease) (HCC)    dialysis MWF   GERD (gastroesophageal reflux disease)    Gout    History of blood transfusion    History of diabetes mellitus    "diet controlled"  HLD (hyperlipidemia)    HOH (hard of hearing)    left ear   Hypertension    hypotensive -since starting dialysis   Hypothyroidism    MRSA (methicillin resistant staph aureus) culture positive 06/01/2017   PAF (paroxysmal atrial fibrillation) ([facility])    a. Echo 11/16:  Mild LVH, EF 55-60%, normal wall motion, MAC, mild MR, severe LAE (49 ml/m2), mild RVE, normal RVSF, mild RAE,  mild TR, PASP 24 mmHg;  CHADS2-VASc: 4 >> Coumadin followed by PCP    Past Surgical History:  Procedure Laterality Date   AV FISTULA PLACEMENT  04/03/2012   Procedure: ARTERIOVENOUS (AV) FISTULA CREATION;  Surgeon: Elam Dutch, MD;  Location: [facility];  Service: Vascular;  Laterality: Left;  creation left brachial cephalic fistula    BIOPSY  08/21/2018   Procedure: BIOPSY;  Surgeon: Thornton Park, MD;  Location: [facility] [place];  Service: Gastroenterology;;   CARPAL TUNNEL RELEASE Bilateral    COLONOSCOPY W/ POLYPECTOMY     COLONOSCOPY WITH PROPOFOL N/A 08/21/2018   Procedure: COLONOSCOPY WITH PROPOFOL;  Surgeon: Thornton Park, MD;  Location: [facility];  Service: Gastroenterology;  Laterality: N/A;   DILATION AND CURETTAGE OF UTERUS     ESOPHAGOGASTRODUODENOSCOPY (EGD) WITH PROPOFOL N/A 08/21/2018   Procedure: ESOPHAGOGASTRODUODENOSCOPY (EGD) WITH PROPOFOL;  Surgeon: Thornton Park, MD;  Location: [facility];  Service: Gastroenterology;  Laterality: N/A;   EYE SURGERY Bilateral    bilateral cataract removal   HEMATOMA EVACUATION Left 05/14/2018   Procedure: Incision and Drainage of Left Arm Hematoma;  Surgeon: Elam Dutch, MD;  Location: [facility] [place] [facility];  Service: Vascular;  Laterality: Left;   Hemodialysis  catheter Right    IR FLUORO GUIDE CV LINE RIGHT  05/26/2017   IR REMOVAL TUN CV CATH W/O FL  05/24/2017   IR US GUIDE VASC ACCESS RIGHT  05/26/2017   LIGATION OF ARTERIOVENOUS  FISTULA Left 02/04/2015   Procedure: LIGATION OF BRACHIOCEPHALIC ARTERIOVENOUS  FISTULA;  Surgeon: Conrad [place], MD;  Location: [facility];  Service: Vascular;  Laterality: Left;   MULTIPLE TOOTH EXTRACTIONS     PARTIAL HIP ARTHROPLASTY     POLYPECTOMY  08/21/2018   Procedure: POLYPECTOMY;  Surgeon: Thornton Park, MD;  Location: [facility];  Service: Gastroenterology;;   PORTACATH PLACEMENT     REVERSE SHOULDER ARTHROPLASTY Left 01/22/2016   Procedure: LEFT REVERSE SHOULDER ARTHROPLASTY;  Surgeon: Netta Cedars, MD;  Location: [facility];  Service: Orthopedics;  Laterality: Left;   SHOULDER ARTHROSCOPY Bilateral    SPLIT NIGHT STUDY  07/26/2015   STERIOD INJECTION Right 01/22/2016   Procedure: RIGHT RING FINGER STEROID INJECTION;  Surgeon: Netta Cedars, MD;  Location: [facility];  Service: Orthopedics;  Laterality: Right;   TEE WITHOUT CARDIOVERSION N/A 05/26/2017   Procedure: TRANSESOPHAGEAL ECHOCARDIOGRAM (TEE);  Surgeon: Lelon Perla, MD;  Location: [facility];  Service: Cardiovascular;  Laterality: N/A;   THROMBECTOMY BRACHIAL ARTERY Left 02/06/2015   Procedure: EVACUATION OF LEFT ARM HEMATOMA;  Surgeon: Angelia Mould, MD;  Location: [facility];  Service: Vascular;  Laterality: Left;   TOTAL KNEE ARTHROPLASTY Bilateral    TUBAL LIGATION       reports that she quit smoking about 23 years ago. Her smoking use included cigarettes. She has a 0.50 pack-year smoking history. She has never used smokeless tobacco. She reports that she does not drink alcohol and does not use drugs.  Allergies  Allergen Reactions   Amoxicillin Rash    Has patient had a PCN reaction causing immediate  rash, facial/tongue/throat swelling, SOB or lightheadedness with hypotension:YES Has patient had a PCN reaction causing severe rash involving mucus membranes or skin necrosis: Yes Has patient had a PCN reaction that required hospitalization No Has patient had a PCN reaction occurring within the last 10 years: Yes If all of the above answers are "NO", then may proceed with Cephalosporin use.    Elemental Sulfur Hives   Sulfur Hives   Peg 3350-Electrolytes Other (See Comments)   Sulfa Drugs Cross Reactors Hives and Itching   Miralax [Polyethylene Glycol] Itching   Mixed Grasses    Percocet [Oxycodone-Acetaminophen] Itching and Rash    Did not happen last time she took it   Sulfa Antibiotics Hives    Family History  Problem Relation Age of Onset   Arthritis Mother    Diabetes Father        before age 33    Heart disease Father    Diabetes Sister    Cancer Brother    Hyperlipidemia Daughter    Hypertension Daughter    Diabetes Daughter    Hypertension Son    Hyperlipidemia Son    Dementia Sister    Cancer Sister        Unknown   Other Sister        Died in car accident   Stroke Brother    Diabetes Daughter    Hypertension Daughter    Hyperlipidemia Daughter    Hepatitis C Son     Prior to Admission medications   Medication Sig Start Date End Date Taking? Authorizing Provider  acetaminophen (TYLENOL) 500 MG tablet Take 2 tablets (1,000 mg total) by mouth every 8 (eight) hours as needed for mild pain or headache. 11/02/18   [name], FNP  albuterol (VENTOLIN HFA) 108 (90 Base) MCG/ACT inhaler Inhale 2 puffs into the lungs every 6 (six) hours as needed for wheezing or shortness of breath. 05/19/20   [name], FNP  alendronate (FOSAMAX) 70 MG tablet Take 1 tablet (70 mg total) by mouth every Thursday. 05/21/20   [name], FNP  AURYXIA 1 GM 210 MG(Fe) tablet Take 210 mg by mouth 3 (three) times daily with meals. 06/18/20   [provider]  budesonide-formoterol (SYMBICORT) 80-4.5 MCG/ACT inhaler Inhale 2 puffs into the lungs 2 (two) times daily. 05/19/20   [name], FNP  cinacalcet (SENSIPAR) 30 MG tablet Take 1 tablet (30 mg total) by mouth every Monday, Wednesday, and Friday. 05/28/17   Allie Bossier, MD  citalopram (CELEXA) 40 MG tablet TAKE 1 TABLET BY MOUTH EVERY DAY 03/22/21   [name], FNP  docusate calcium (SURFAK) 240 MG capsule Take 1 capsule (240 mg total) by mouth daily. 11/19/20   [name], FNP  doxercalciferol (HECTOROL) 4 MCG/2ML injection Inject 0.5 mLs (1 mcg total) into the vein every Monday, Wednesday, and Friday with hemodialysis. 05/29/17   Allie Bossier, MD  ezetimibe (ZETIA) 10 MG tablet Take 1 tablet (10 mg total) by mouth daily. 05/19/20   [name], FNP  famotidine (PEPCID) 20 MG tablet Take 1 tablet (20 mg  total) by mouth 2 (two) times daily. 11/19/20   [name], FNP  hydrALAZINE (APRESOLINE) 10 MG tablet Take 1 tablet (10 mg total) by mouth 3 (three) times daily. 02/23/21   [name], FNP  HYDROcodone-acetaminophen (NORCO) 7.5-325 MG tablet Take 1 tablet by mouth every 6 (six) hours as needed for moderate pain. 02/23/21   Blanch Media,  Shireen Quan, FNP  HYDROcodone-acetaminophen (NORCO) 7.5-325 MG tablet Take 1 tablet by mouth every 6 (six) hours as needed for moderate pain. 03/25/21   [name], FNP  HYDROcodone-acetaminophen (NORCO) 7.5-325 MG tablet Take 1 tablet by mouth every 6 (six) hours as needed for moderate pain. 04/24/21   [name], FNP  levocetirizine (XYZAL) 5 MG tablet TAKE 1 TABLET BY MOUTH EVERY DAY IN THE EVENING 11/26/20   [name] F, FNP  levothyroxine (SYNTHROID) 125 MCG tablet Take 1 tablet (125 mcg total) by mouth daily. 02/24/21   [name], FNP  multivitamin (RENA-VIT) TABS tablet Take 1 tablet by mouth daily.    [provider]  mupirocin cream (BACTROBAN) 2 % Apply 1 application topically 2 (two) times daily. 01/01/21   Ivy Lynn, NP  nystatin cream (MYCOSTATIN) Apply 1 application topically 2 (two) times daily as needed for dry skin. 08/18/20   [name], FNP  pantoprazole (PROTONIX) 20 MG tablet Take 1 tablet (20 mg total) by mouth daily. 11/19/20   [name], FNP  pramipexole (MIRAPEX) 0.125 MG tablet Take 1 tablet (0.125 mg total) by mouth at bedtime. 02/23/21   [name], FNP    Physical Exam:  Constitutional: Elderly female who appears chronically ill but in no acute distress and able to follow commands Vitals:   04/05/21 0852 04/05/21 0930 04/05/21 0938 04/05/21 1030  BP:  101/76  107/82  Pulse:  99  68  Resp:  18  (!) 22  Temp:   (!) 101.5 F (38.6 C)   TempSrc:   Rectal   SpO2:  94%  92%  Weight: 54.4 kg     Height: _0  (1.651 m)      Eyes: PERRL, lids and conjunctivae normal ENMT:  Mucous membranes are moist. Posterior pharynx clear of any exudate or lesions.  Neck: normal, supple, no masses, no thyromegaly Respiratory: Normal respiratory effort without significant wheezes or crackles appreciated. Cardiovascular: Irregular irregular.  Left chest wall hemodialysis catheter with erythema of the skin around the insertion site at does not appear to be secured Abdomen: no tenderness, no masses palpated. No hepatosplenomegaly. Bowel sounds positive.  Musculoskeletal: no clubbing / cyanosis. No joint deformity upper and lower extremities. Good ROM, no contractures. Normal muscle tone.  Skin: no rashes, lesions, ulcers. No induration Neurologic: CN 2-12 grossly intact. Sensation intact, DTR normal. Strength 5/5 in all 4.  Psychiatric: Normal judgment and insight. Alert and oriented x 3. Normal mood.     Labs on Admission: I have personally reviewed following labs and imaging studies  CBC: Recent Labs  Lab 04/05/21 0918  WBC 15.9*  NEUTROABS 14.2*  HGB 11.2*  HCT 35.3*  MCV 98.3  PLT 446   Basic Metabolic Panel: Recent Labs  Lab 04/05/21 0918  NA 139  K 4.1  CL 102  CO2 25  GLUCOSE 65*  BUN 32*  CREATININE 5.35*  CALCIUM 8.3*   GFR: Estimated Creatinine Clearance: 7.2 mL/min (A) (by C-G formula based on SCr of 5.35 mg/dL (H)). Liver Function Tests: Recent Labs  Lab 04/05/21 0918  AST 34  ALT 20  ALKPHOS 61  BILITOT 1.0  PROT 5.7*  ALBUMIN 2.8*   No results for input(s): LIPASE, AMYLASE in the last 168 hours. No results for input(s): AMMONIA in the last 168 hours. Coagulation Profile: No results for input(s): INR, PROTIME in the last 168 hours. Cardiac Enzymes: No results for input(s): CKTOTAL, CKMB, CKMBINDEX, TROPONINI  in the last 168 hours. BNP (last 3 results) No results for input(s): PROBNP in the last 8760 hours. HbA1C: No results for input(s): HGBA1C in the last 72 hours. CBG: Recent Labs  Lab 04/05/21 1103  GLUCAP 86   Lipid  Profile: No results for input(s): CHOL, HDL, LDLCALC, TRIG, CHOLHDL, LDLDIRECT in the last 72 hours. Thyroid Function Tests: No results for input(s): TSH, T4TOTAL, FREET4, T3FREE, THYROIDAB in the last 72 hours. Anemia Panel: No results for input(s): VITAMINB12, FOLATE, FERRITIN, TIBC, IRON, RETICCTPCT in the last 72 hours. Urine analysis:    Component Value Date/Time   COLORURINE YELLOW 07/26/2013 1202   APPEARANCEUR Cloudy (A) 05/20/2020 1128   LABSPEC 1.016 07/26/2013 1202   PHURINE 7.0 07/26/2013 1202   GLUCOSEU Negative 05/20/2020 1128   HGBUR SMALL (A) 07/26/2013 1202   BILIRUBINUR Negative 05/20/2020 1128   KETONESUR NEGATIVE 07/26/2013 1202   PROTEINUR 3+ (A) 05/20/2020 1128   PROTEINUR 100 (A) 07/26/2013 1202   UROBILINOGEN 1.0 07/26/2013 1202   NITRITE Negative 05/20/2020 1128   NITRITE NEGATIVE 07/26/2013 1202   LEUKOCYTESUR 3+ (A) 05/20/2020 1128   Sepsis Labs: Recent Results (from the past 240 hour(s))  Resp Panel by RT-PCR (Flu A&B, Covid) Nasopharyngeal Swab     Status: Abnormal   Collection Time: 04/05/21  9:18 AM   Specimen: Nasopharyngeal Swab; Nasopharyngeal(NP) swabs in vial transport medium  Result Value Ref Range Status   SARS Coronavirus 2 by RT PCR POSITIVE (A) NEGATIVE Final    Comment: (NOTE) SARS-CoV-2 target nucleic acids are DETECTED.  The SARS-CoV-2 RNA is generally detectable in upper respiratory specimens during the acute phase of infection. Positive results are indicative of the presence of the identified virus, but do not rule out bacterial infection or co-infection with other pathogens not detected by the test. Clinical correlation with patient history and other diagnostic information is necessary to determine patient infection status. The expected result is Negative.  Fact Sheet for Patients: EntrepreneurPulse.com.au  Fact Sheet for Healthcare Providers: IncredibleEmployment.be  This test is not yet  approved or cleared by the [place] FDA and  has been authorized for detection and/or diagnosis of SARS-CoV-2 by FDA under an Emergency Use Authorization (EUA).  This EUA will remain in effect (meaning this test can be used) for the duration of  the COVID-19 declaration under Section 564(b)(1) of the A ct, 21 U.S.C. section 360bbb-3(b)(1), unless the authorization is terminated or revoked sooner.     Influenza A by PCR NEGATIVE NEGATIVE Final   Influenza B by PCR NEGATIVE NEGATIVE Final    Comment: (NOTE) The Xpert Xpress SARS-CoV-2/FLU/RSV plus assay is intended as an aid in the diagnosis of influenza from Nasopharyngeal swab specimens and should not be used as a sole basis for treatment. Nasal washings and aspirates are unacceptable for Xpert Xpress SARS-CoV-2/FLU/RSV testing.  Fact Sheet for Patients: EntrepreneurPulse.com.au  Fact Sheet for Healthcare Providers: IncredibleEmployment.be  This test is not yet approved or cleared by the [place] FDA and has been authorized for detection and/or diagnosis of SARS-CoV-2 by FDA under an Emergency Use Authorization (EUA). This EUA will remain in effect (meaning this test can be used) for the duration of the COVID-19 declaration under Section 564(b)(1) of the Act, 21 U.S.C. section 360bbb-3(b)(1), unless the authorization is terminated or revoked.  Performed at [facility], [place] [address] [place], [place] [address]      Radiological Exams on Admission: DG Chest Portable 1 View  Result Date: 04/05/2021 CLINICAL  DATA:  Cough EXAM: PORTABLE CHEST 1 VIEW COMPARISON:  Chest radiograph dated Aug 14, 2017 and September 06, 2018 FINDINGS: The heart size and mediastinal contours are within normal limits. Aorta is tortuous with atherosclerotic calcifications. Left access dialysis catheter with distal tip at the cavoatrial junction, unchanged. Both lungs are clear. Bilateral shoulder arthroplasty.  IMPRESSION: No acute cardiopulmonary process. Electronically Signed   By: Keane Police D.O.   On: 04/05/2021 09:28    EKG: Independently reviewed.  Atrial fibrillation 88 bpm Assessment/Plan Sepsis secondary infection of hemodialysis catheter: Acute.  Patient presents after being found to be febrile up to 101.5 F with tachypnea and WBC 15.9.  Lactic acid elevated at 2.1.  Patient found to have redness erythema of her hemodialysis catheter of the left chest wall that did not appear to be secured.  Nephrology placed order for IR to exchange catheter.  Patient has been started on empiric antibiotics of vancomycin and aztreonam after blood cultures have been obtained.  On the differential also includes COVID-19. -Admit to a telemetry bed -Follow-up blood cultures -N.p.o. after midnight for need of hemodialysis catheter exchange -Continue empiric antibiotics of vancomycin and aztreonam.  De-escalate when medically appropriate -Appreciate IR consultative services, we will follow-up for any further recommendation  COVID-19 infection: Acute.  Patient was incidentally noted to be positive for COVID-19 on admission.  Chest x-ray showed no acute abnormalities.  Inflammatory markers were noted to be elevated with procalcitonin 8.21, CRP 6.8, ferritin 1374, and BNP 1839.5. -COVID-19 order set utilized -Molnupiravir -Combivent inhaler as needed shortness of breath/wheeze -Antitussives as needed  -Vitamin C and zinc  Hypoglycemia: Acute.  On admission glucose was noted to be as low as 65.  She was able to be given with improvement in blood sugar to 85. -Hypoglycemic protocols -CBGs every 2 hours and x4 -Amp of D50 as needed for blood sugars less than 70  Right-sided chest pain: Acute.  Patient reports having pleuritic right-sided chest pain.  D-dimer was elevated at 2.72. -Check CT angiogram chest to make sure no signs of blood clot given history of atrial fibrillation  Atrial fibrillation: Patient was  noted to be in atrial fibrillation with heart rates initially elevated into the 120s in route with EMS, but currently rate controlled.  Not on any anticoagulation due to prior history of bleeding. -Continue to monitor  ESRD on HD: Patient received a partial dialysis session today.  Nephrology consulted and plan on dialyzing patient on 1/4. -Appreciate nephrology consultative services, we will follow-up for any further recommendation  Essential hypertension: Blood sugars currently maintained.  Home blood pressure medication regimen includes hydralazine 10 mg 3 times daily. -Continue blood pressure regimen as tolerated  Elevated troponin: Acute. High-sensitivity troponins were 202->209.  Chest pain is right sided and seems atypical. -Continue to monitor  COPD, without acute exacerbation -Continue levocetirizine and pharmacy substitution for Symbicort  Hypothyroidism: TSH 7.11 on 02/23/2021. -Add on TSH -Continue levothyroxine  Depression and anxiety -Continue Celexa  Chronic pain -Continue home medication regimen  DNR present on admission  DVT prophylaxis: Heparin Code Status: DNR Family Communication: Daughter updated at bedside Disposition Plan: Hopefully home Consults called: Nephrology, IR Admission status: Inpatient require more than 2 midnight stay  Norval Morton MD [facility]   If 7PM-7AM, please contact night-coverage   04/05/2021, 11:27 AM

## 2021-04-05 NOTE — ED Provider Notes (Signed)
[facility] EMERGENCY DEPARTMENT Provider Note   CSN: [id number] Arrival date & time: 04/05/21  0938     History  Chief Complaint  Patient presents with   Emesis    [name] is a 81 y.o. female.   Emesis Associated symptoms: abdominal pain, chills and cough   Patient presents from dialysis.  Poorly done about an hour and a half a dialysis.  She is a Monday Wednesday Friday dialysis.  Developed chills shaking and a cough.  Sent in for further work-up.  States her caregiver has had a cough.  Patient has been feeling well up until the dialysis today.  States she had pain in her right abdomen/flank.  No dysuria.  Still does make some urine.    Home Medications Prior to Admission medications   Medication Sig Start Date End Date Taking? Authorizing Provider  acetaminophen (TYLENOL) 500 MG tablet Take 2 tablets (1,000 mg total) by mouth every 8 (eight) hours as needed for mild pain or headache. 11/02/18  Yes [name], FNP  albuterol (VENTOLIN HFA) 108 (90 Base) MCG/ACT inhaler Inhale 2 puffs into the lungs every 6 (six) hours as needed for wheezing or shortness of breath. 05/19/20  Yes [name], FNP  alendronate (FOSAMAX) 70 MG tablet Take 1 tablet (70 mg total) by mouth every Thursday. 05/21/20  Yes [name], FNP  AURYXIA 1 GM 210 MG(Fe) tablet Take 420 mg by mouth 3 (three) times daily with meals. 06/18/20  Yes [provider]  budesonide-formoterol (SYMBICORT) 80-4.5 MCG/ACT inhaler Inhale 2 puffs into the lungs 2 (two) times daily. 05/19/20  Yes [name] F, FNP  cinacalcet (SENSIPAR) 30 MG tablet Take 1 tablet (30 mg total) by mouth every Monday, Wednesday, and Friday. 05/28/17  Yes Allie Bossier, MD  citalopram (CELEXA) 40 MG tablet TAKE 1 TABLET BY MOUTH EVERY DAY Patient taking differently: Take 40 mg by mouth daily. 03/22/21  Yes [name] F, FNP  docusate calcium (SURFAK) 240 MG capsule Take 1 capsule (240 mg total) by  mouth daily. 11/19/20  Yes [name] F, FNP  doxercalciferol (HECTOROL) 4 MCG/2ML injection Inject 0.5 mLs (1 mcg total) into the vein every Monday, Wednesday, and Friday with hemodialysis. 05/29/17  Yes Allie Bossier, MD  ezetimibe (ZETIA) 10 MG tablet Take 1 tablet (10 mg total) by mouth daily. 05/19/20  Yes [name], FNP  famotidine (PEPCID) 20 MG tablet Take 1 tablet (20 mg total) by mouth 2 (two) times daily. 11/19/20  Yes [name] F, FNP  hydrALAZINE (APRESOLINE) 10 MG tablet Take 1 tablet (10 mg total) by mouth 3 (three) times daily. 02/23/21  Yes [name] F, FNP  HYDROcodone-acetaminophen (NORCO) 7.5-325 MG tablet Take 1 tablet by mouth every 6 (six) hours as needed for moderate pain. 04/24/21  Yes [name] F, FNP  levocetirizine (XYZAL) 5 MG tablet TAKE 1 TABLET BY MOUTH EVERY DAY IN THE EVENING Patient taking differently: Take 5 mg by mouth every evening. 11/26/20  Yes [name], FNP  levothyroxine (SYNTHROID) 125 MCG tablet Take 1 tablet (125 mcg total) by mouth daily. 02/24/21  Yes [name] F, FNP  multivitamin (RENA-VIT) TABS tablet Take 1 tablet by mouth daily.   Yes [provider]  mupirocin cream (BACTROBAN) 2 % Apply 1 application topically 2 (two) times daily. 01/01/21  Yes Ivy Lynn, NP  nystatin cream (MYCOSTATIN) Apply 1 application topically 2 (two) times daily as needed  for dry skin. 08/18/20  Yes [name] F, FNP  pantoprazole (PROTONIX) 20 MG tablet Take 1 tablet (20 mg total) by mouth daily. 11/19/20  Yes [name] F, FNP  pramipexole (MIRAPEX) 0.125 MG tablet Take 1 tablet (0.125 mg total) by mouth at bedtime. 02/23/21  Yes [name] F, FNP  HYDROcodone-acetaminophen (NORCO) 7.5-325 MG tablet Take 1 tablet by mouth every 6 (six) hours as needed for moderate pain. Patient not taking: Reported on 04/05/2021 02/23/21   [name], FNP  HYDROcodone-acetaminophen (NORCO) 7.5-325 MG tablet Take 1 tablet by  mouth every 6 (six) hours as needed for moderate pain. Patient not taking: Reported on 04/05/2021 03/25/21   [name], FNP      Allergies    Amoxicillin, Elemental sulfur, Sulfur, Peg 3350-electrolytes, Sulfa drugs cross reactors, Miralax [polyethylene glycol], Mixed grasses, Percocet [oxycodone-acetaminophen], and Sulfa antibiotics    Review of Systems   Review of Systems  Constitutional:  Positive for chills.  Respiratory:  Positive for cough.   Gastrointestinal:  Positive for abdominal pain and vomiting.  Genitourinary:  Positive for flank pain.  Musculoskeletal:  Negative for back pain.  Neurological:  Negative for weakness.  Psychiatric/Behavioral:  Negative for confusion.    Physical Exam Updated Vital Signs BP 107/69    Pulse 86    Temp 99.4 F (37.4 C) (Oral)    Resp (!) 23    Ht 5\' 5"  (1.651 m)    Wt 54.4 kg    SpO2 92%    BMI 19.97 kg/m  Physical Exam Vitals and nursing note reviewed.  Constitutional:      Appearance: Normal appearance.  HENT:     Head: Atraumatic.     Mouth/Throat:     Mouth: Mucous membranes are moist.  Cardiovascular:     Rate and Rhythm: Regular rhythm.  Pulmonary:     Comments: Equal breath sounds bilaterally.  Left chest wall dialysis catheter.  Some erythema at site.  No suture. Abdominal:     Tenderness: There is no abdominal tenderness.  Musculoskeletal:        General: No tenderness.     Cervical back: Neck supple.  Skin:    General: Skin is warm.  Neurological:     Mental Status: She is alert and oriented to person, place, and time.     ED Results / Procedures / Treatments   Labs (all labs ordered are listed, but only abnormal results are displayed) Labs Reviewed  RESP PANEL BY RT-PCR (FLU A&B, COVID) ARPGX2 - Abnormal; Notable for the following components:      Result Value   SARS Coronavirus 2 by RT PCR POSITIVE (*)    All other components within normal limits  CBC WITH DIFFERENTIAL/PLATELET - Abnormal; Notable for  the following components:   WBC 15.9 (*)    RBC 3.59 (*)    Hemoglobin 11.2 (*)    HCT 35.3 (*)    RDW 16.2 (*)    Neutro Abs 14.2 (*)    Lymphs Abs 0.4 (*)    Monocytes Absolute 1.1 (*)    Abs Immature Granulocytes 0.10 (*)    All other components within normal limits  COMPREHENSIVE METABOLIC PANEL - Abnormal; Notable for the following components:   Glucose, Bld 65 (*)    BUN 32 (*)    Creatinine, Ser 5.35 (*)    Calcium 8.3 (*)    Total Protein 5.7 (*)    Albumin 2.8 (*)    GFR, Estimated  8 (*)    All other components within normal limits  LACTIC ACID, PLASMA - Abnormal; Notable for the following components:   Lactic Acid, Venous 2.1 (*)    All other components within normal limits  CULTURE, BLOOD (ROUTINE X 2)  CULTURE, BLOOD (ROUTINE X 2)  URINALYSIS, ROUTINE W REFLEX MICROSCOPIC  LACTIC ACID, PLASMA  D-DIMER, QUANTITATIVE  PROCALCITONIN  LACTATE DEHYDROGENASE  FERRITIN  FIBRINOGEN  C-REACTIVE PROTEIN  BRAIN NATRIURETIC PEPTIDE  TRIGLYCERIDES  CBG MONITORING, ED  TROPONIN I (HIGH SENSITIVITY)  TROPONIN I (HIGH SENSITIVITY)    EKG EKG Interpretation  Date/Time:  Monday April 05 2021 09:28:10 EST Ventricular Rate:  88 PR Interval:    QRS Duration: 84 QT Interval:  396 QTC Calculation: 480 R Axis:   208 Text Interpretation: Atrial fibrillation Probable anteroseptal infarct, recent Confirmed by Davonna Belling [phone]) on 04/05/2021 10:29:37 AM  Radiology DG Chest Portable 1 View  Result Date: 04/05/2021 CLINICAL DATA:  Cough EXAM: PORTABLE CHEST 1 VIEW COMPARISON:  Chest radiograph dated Aug 14, 2017 and September 06, 2018 FINDINGS: The heart size and mediastinal contours are within normal limits. Aorta is tortuous with atherosclerotic calcifications. Left access dialysis catheter with distal tip at the cavoatrial junction, unchanged. Both lungs are clear. Bilateral shoulder arthroplasty. IMPRESSION: No acute cardiopulmonary process. Electronically Signed   By: Keane Police D.O.   On: 04/05/2021 09:28    Procedures Procedures    Medications Ordered in ED Medications  aztreonam (AZACTAM) 0.5 g in dextrose 5 % 50 mL IVPB (0 g Intravenous Stopped 04/05/21 1252)  vancomycin (VANCOREADY) IVPB 500 mg/100 mL (has no administration in time range)  heparin injection 5,000 Units (5,000 Units Subcutaneous Given 04/05/21 1348)  sodium chloride flush (NS) 0.9 % injection 3 mL (3 mLs Intravenous Given 04/05/21 1349)  Ipratropium-Albuterol (COMBIVENT) respimat 1 puff (has no administration in time range)  guaiFENesin-dextromethorphan (ROBITUSSIN DM) 100-10 MG/5ML syrup 10 mL (10 mLs Oral Given 04/05/21 1456)  chlorpheniramine-HYDROcodone (TUSSIONEX) 10-8 MG/5ML suspension 5 mL (has no administration in time range)  ascorbic acid (VITAMIN C) tablet 500 mg (500 mg Oral Given 04/05/21 1348)  zinc sulfate capsule 220 mg (220 mg Oral Given 04/05/21 1348)  vancomycin (VANCOREADY) IVPB 1250 mg/250 mL (0 mg Intravenous Stopped 04/05/21 1347)    ED Course/ Medical Decision Making/ A&P                           Medical Decision Making  This patient presents to the ED for concern of chills and cough., this involves an extensive number of treatment options, and is a complaint that carries with it a high risk of complications and morbidity.  The differential diagnosis includes pneumonia, COVID, line infection.   Co morbidities that complicate the patient evaluation  Diabetes.  End-stage renal disease on dialysis.   Additional history obtained:  Additional history obtained from family Amber with patient. External records from outside source obtained and reviewed including previous admission notes.   Lab Tests:  I Ordered, and personally interpreted labs.  The pertinent results include: Hypoglycemia.  Elevated white count.  Reassuring potassium.  Positive COVID test.   Imaging Studies ordered:  I ordered imaging studies including chest x-ray.   I independently visualized and  interpreted imaging which showed no pneumonia.  Dialysis catheter in place. I agree with the radiologist interpretation   Cardiac Monitoring:  The patient was maintained on a cardiac monitor.  I personally viewed and interpreted  the cardiac monitored which showed an underlying rhythm of: Atrial fibrillation.   Medicines ordered and prescription drug management:  I ordered medication including aztreonam and vancomycin.  For line infection/upper respiratory infection/pneumonia. Reevaluation of the patient after these medicines showed that the patient improved I have reviewed the patients home medicines and have made adjustments as needed   Test Considered:     Critical Interventions:  Antibiotics given.   Consultations Obtained:  I requested consultation with the nephrology and hospitalist.,  and discussed lab and imaging findings as well as pertinent plan - they recommend: Admission.    Discussed with Dr. Royce Macadamia from nephrology who will follow patient and follow her access   Problem List / ED Course:  Patient presented fever.  Did have cough.  Also has dialysis catheter in left chest wall.  Some erythema around the catheter and does not appear to be sutured in place.  White count is elevated.  Antibiotics given empirically to cover pneumonia with a cough and line infection.  Discussed with pharmacist and medication choice.  However COVID test came back positive.  Chest x-ray reassuring.  Does have room air oxygen saturations of around 90 to 91%.  With this and feels that she would benefit from mission to the hospital.  Will discuss with nephrology and hospitalist for admission.  Also will feed patient for her mild hypoglycemia   Reevaluation:  After the interventions noted above, I reevaluated the patient and found that they have :improved      Dispostion:  After consideration of the diagnostic results and the patients response to treatment, I feel that the patent would  benefit from admission to hospital.   CRITICAL CARE Performed by: Davonna Belling Total critical care time: 30 minutes Critical care time was exclusive of separately billable procedures and treating other patients. Critical care was necessary to treat or prevent imminent or life-threatening deterioration. Critical care was time spent personally by me on the following activities: development of treatment plan with patient and/or surrogate as well as nursing, discussions with consultants, evaluation of patient's response to treatment, examination of patient, obtaining history from patient or surrogate, ordering and performing treatments and interventions, ordering and review of laboratory studies, ordering and review of radiographic studies, pulse oximetry and re-evaluation of patient's condition.         Final Clinical Impression(s) / ED Diagnoses Final diagnoses:  COVID-19  End stage renal disease on dialysis [facility])    Rx / Allison Orders ED Discharge Orders     None         Davonna Belling, MD 04/05/21 1516

## 2021-04-06 ENCOUNTER — Inpatient Hospital Stay (HOSPITAL_COMMUNITY): Payer: Medicare Other

## 2021-04-06 DIAGNOSIS — F419 Anxiety disorder, unspecified: Secondary | ICD-10-CM | POA: Diagnosis not present

## 2021-04-06 DIAGNOSIS — E039 Hypothyroidism, unspecified: Secondary | ICD-10-CM | POA: Diagnosis not present

## 2021-04-06 DIAGNOSIS — A419 Sepsis, unspecified organism: Secondary | ICD-10-CM | POA: Diagnosis not present

## 2021-04-06 DIAGNOSIS — U071 COVID-19: Secondary | ICD-10-CM | POA: Diagnosis not present

## 2021-04-06 LAB — CBC WITH DIFFERENTIAL/PLATELET
Abs Immature Granulocytes: 0.2 10*3/uL — ABNORMAL HIGH (ref 0.00–0.07)
Band Neutrophils: 3 %
Basophils Absolute: 0.2 10*3/uL — ABNORMAL HIGH (ref 0.0–0.1)
Basophils Relative: 1 %
Eosinophils Absolute: 0 10*3/uL (ref 0.0–0.5)
Eosinophils Relative: 0 %
HCT: 33.2 % — ABNORMAL LOW (ref 36.0–46.0)
Hemoglobin: 10.7 g/dL — ABNORMAL LOW (ref 12.0–15.0)
Lymphocytes Relative: 3 %
Lymphs Abs: 0.7 10*3/uL (ref 0.7–4.0)
MCH: 31 pg (ref 26.0–34.0)
MCHC: 32.2 g/dL (ref 30.0–36.0)
MCV: 96.2 fL (ref 80.0–100.0)
Metamyelocytes Relative: 1 %
Monocytes Absolute: 1 10*3/uL (ref 0.1–1.0)
Monocytes Relative: 4 %
Neutro Abs: 21.8 10*3/uL — ABNORMAL HIGH (ref 1.7–7.7)
Neutrophils Relative %: 88 %
Platelets: 205 10*3/uL (ref 150–400)
RBC: 3.45 MIL/uL — ABNORMAL LOW (ref 3.87–5.11)
RDW: 16.2 % — ABNORMAL HIGH (ref 11.5–15.5)
WBC: 24 10*3/uL — ABNORMAL HIGH (ref 4.0–10.5)
nRBC: 0 % (ref 0.0–0.2)
nRBC: 0 /100 WBC

## 2021-04-06 LAB — MAGNESIUM: Magnesium: 2 mg/dL (ref 1.7–2.4)

## 2021-04-06 LAB — COMPREHENSIVE METABOLIC PANEL
ALT: 18 U/L (ref 0–44)
AST: 34 U/L (ref 15–41)
Albumin: 2.2 g/dL — ABNORMAL LOW (ref 3.5–5.0)
Alkaline Phosphatase: 69 U/L (ref 38–126)
Anion gap: 12 (ref 5–15)
BUN: 48 mg/dL — ABNORMAL HIGH (ref 8–23)
CO2: 21 mmol/L — ABNORMAL LOW (ref 22–32)
Calcium: 7.7 mg/dL — ABNORMAL LOW (ref 8.9–10.3)
Chloride: 102 mmol/L (ref 98–111)
Creatinine, Ser: 6.03 mg/dL — ABNORMAL HIGH (ref 0.44–1.00)
GFR, Estimated: 7 mL/min — ABNORMAL LOW (ref >60)
Glucose, Bld: 90 mg/dL (ref 70–99)
Potassium: 4.9 mmol/L (ref 3.5–5.1)
Sodium: 135 mmol/L (ref 135–145)
Total Bilirubin: 0.8 mg/dL (ref 0.3–1.2)
Total Protein: 5.2 g/dL — ABNORMAL LOW (ref 6.5–8.1)

## 2021-04-06 LAB — MRSA NEXT GEN BY PCR, NASAL: MRSA by PCR Next Gen: DETECTED — AB

## 2021-04-06 LAB — PHOSPHORUS: Phosphorus: 3.3 mg/dL (ref 2.5–4.6)

## 2021-04-06 LAB — GLUCOSE, CAPILLARY
Glucose-Capillary: 82 mg/dL (ref 70–99)
Glucose-Capillary: 74 mg/dL (ref 70–99)
Glucose-Capillary: 73 mg/dL (ref 70–99)
Glucose-Capillary: 91 mg/dL (ref 70–99)

## 2021-04-06 LAB — TROPONIN I (HIGH SENSITIVITY): Troponin I (High Sensitivity): 105 ng/L (ref <18)

## 2021-04-06 MED ORDER — CHLORHEXIDINE GLUCONATE CLOTH 2 % EX PADS
6.0000 | MEDICATED_PAD | Freq: Every day | CUTANEOUS | Status: DC
  Administered 2021-04-06: 6 via TOPICAL

## 2021-04-06 MED ORDER — MUPIROCIN 2 % EX OINT
TOPICAL_OINTMENT | Freq: Two times a day (BID) | CUTANEOUS | Status: DC
  Filled 2021-04-06 (×2): qty 22

## 2021-04-06 MED ORDER — CHLORHEXIDINE GLUCONATE CLOTH 2 % EX PADS
6.0000 | MEDICATED_PAD | Freq: Every day | CUTANEOUS | Status: DC
  Administered 2021-04-07 – 2021-04-10 (×3): 6 via TOPICAL

## 2021-04-06 MED ORDER — HEPARIN SODIUM (PORCINE) 1000 UNIT/ML IJ SOLN
INTRAMUSCULAR | Status: AC
  Filled 2021-04-06: qty 10

## 2021-04-06 MED ORDER — LIDOCAINE-EPINEPHRINE 1 %-1:100000 IJ SOLN
INTRAMUSCULAR | Status: AC
  Filled 2021-04-06: qty 1

## 2021-04-06 MED ORDER — SODIUM CHLORIDE 0.9 % IV SOLN
1.0000 g | Freq: Once | INTRAVENOUS | Status: AC
  Administered 2021-04-06: 1 g via INTRAVENOUS
  Filled 2021-04-06 (×2): qty 1

## 2021-04-06 MED ORDER — SODIUM CHLORIDE 0.9 % IV SOLN
2.0000 g | INTRAVENOUS | Status: DC
  Administered 2021-04-07: 2 g via INTRAVENOUS
  Filled 2021-04-06: qty 2

## 2021-04-06 MED ORDER — FAMOTIDINE 20 MG PO TABS
20.0000 mg | ORAL_TABLET | Freq: Every day | ORAL | Status: DC
  Administered 2021-04-07 – 2021-04-10 (×4): 20 mg via ORAL
  Filled 2021-04-06 (×4): qty 1

## 2021-04-06 NOTE — Progress Notes (Signed)
Pharmacy Antibiotic Note  [name] is a 81 y.o. 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 Pharmacy has been consulted for Aztreonam and Vancomycin dosing. Pt with ESRD (MWF HD).  Pt with rash (immediate reaction of severe rash) to PCN.   Pt has received cefazolin in the past x2. D/w Dr. Kurtis Bushman and we will change aztreonam to cefepime empirically.   Plan: Dc aztreonam Cefepime 1g IV x1 today then 2g MWF Vancomycin  1250 mg now then 500 mg IV Q HD.  Vanc levels prn   Height: 5\' 5"  (165.1 cm) Weight: 54.4 kg (120 lb) IBW/kg (Calculated) : 57  Temp (24hrs), Avg:98.5 F (36.9 C), Min:97.9 F (36.6 C), Max:99.6 F (37.6 C)  Recent Labs  Lab 04/05/21 0918 04/05/21 1020 04/05/21 1350 04/06/21 0202  WBC 15.9*  --   --  24.0*  CREATININE 5.35*  --   --  6.03*  LATICACIDVEN  --  2.1* 2.1*  --      Estimated Creatinine Clearance: 6.4 mL/min (A) (by C-G formula based on SCr of 6.03 mg/dL (H)).    Allergies  Allergen Reactions   Amoxicillin Rash    Has patient had a PCN reaction causing immediate rash, facial/tongue/throat swelling, SOB or lightheadedness with hypotension:YES Has patient had a PCN reaction causing severe rash involving mucus membranes or skin necrosis: Yes Has patient had a PCN reaction that required hospitalization No Has patient had a PCN reaction occurring within the last 10 years: Yes If all of the above answers are "NO", then may proceed with Cephalosporin use.    Elemental Sulfur Hives   Sulfur Hives   Peg 3350-Electrolytes Other (See Comments)   Sulfa Drugs Cross Reactors Hives and Itching   Miralax [Polyethylene Glycol] Itching   Mixed Grasses    Percocet [Oxycodone-Acetaminophen] Itching and Rash    Did not happen last time she took it   Sulfa Antibiotics Hives    Antimicrobials this admission: 1/2 Aztreonam >> 1/3 1/3 cefepime>> 1/2 Vancomycin >>  1/2 molnupiravir x5d  Microbiology results: 1/2  BCx>ngtd COVID+  Onnie Boer, PharmD, [place], AAHIVP, CPP Infectious Disease Pharmacist 04/06/2021 1:50 PM

## 2021-04-06 NOTE — Progress Notes (Signed)
PROGRESS NOTE    [name]  ACZ:[id number] DOB: 11-16-1940 DOA: 04/05/2021 PCP: [name], FNP    Brief Narrative:  [name] is a 81 y.o. female with medical history significant of ESRD on HD, COPD, paroxysmal atrial fibrillation on aspirin, anemia of chronic disease, hypothyroidism, anxiety, and depression who presents after having severe chills with shakes while at dialysis.  She had complained of some chills this morning, but chronically cold natured so she thought nothing of it.  Patient has had a nonproductive cough that has gotten deeper over the last 2 days and complained of some right-sided chest pain especially with taking a deep breath. She had received an hour of hemodialysis prior to onset of chills and shakes.    Consultants:  IR, nephrology  Procedures:   Antimicrobials:  Cefepime   Subjective: Has no complaints.  No shortness of breath or chest pain  Objective: Vitals:   04/05/21 2053 04/06/21 0153 04/06/21 0418 04/06/21 0817  BP:  102/67 95/80 103/72  Pulse:  73 75 80  Resp:  16 18 18   Temp:  98.1 F (36.7 C) 97.9 F (36.6 C) 98 F (36.7 C)  TempSrc:      SpO2: 95% 95% 96% 97%  Weight:      Height:        Intake/Output Summary (Last 24 hours) at 04/06/2021 1502 Last data filed at 04/06/2021 1300 Gross per 24 hour  Intake 505.97 ml  Output 0 ml  Net 505.97 ml   Filed Weights   04/05/21 0852  Weight: 54.4 kg    Examination:  General exam: Appears calm and comfortable  Respiratory system: Clear to auscultation. Respiratory effort normal. Cardiovascular system: S1 & S2 heard, RRR. No gallops  Gastrointestinal system: Abdomen is nondistended, soft and nontender. No organomegaly or masses felt.  Central nervous system: Alert and oriented.  Grossly intact Extremities: No edema Psychiatry:  mood & affect appropriate.     Data Reviewed: I have personally reviewed following labs and imaging studies  CBC: Recent Labs  Lab 04/05/21 0918  04/06/21 0202  WBC 15.9* 24.0*  NEUTROABS 14.2* 21.8*  HGB 11.2* 10.7*  HCT 35.3* 33.2*  MCV 98.3 96.2  PLT 218 109   Basic Metabolic Panel: Recent Labs  Lab 04/05/21 0918 04/06/21 0202  NA 139 135  K 4.1 4.9  CL 102 102  CO2 25 21*  GLUCOSE 65* 90  BUN 32* 48*  CREATININE 5.35* 6.03*  CALCIUM 8.3* 7.7*  MG  --  2.0  PHOS  --  3.3   GFR: Estimated Creatinine Clearance: 6.4 mL/min (A) (by C-G formula based on SCr of 6.03 mg/dL (H)). Liver Function Tests: Recent Labs  Lab 04/05/21 0918 04/06/21 0202  AST 34 34  ALT 20 18  ALKPHOS 61 69  BILITOT 1.0 0.8  PROT 5.7* 5.2*  ALBUMIN 2.8* 2.2*   No results for input(s): LIPASE, AMYLASE in the last 168 hours. No results for input(s): AMMONIA in the last 168 hours. Coagulation Profile: No results for input(s): INR, PROTIME in the last 168 hours. Cardiac Enzymes: No results for input(s): CKTOTAL, CKMB, CKMBINDEX, TROPONINI in the last 168 hours. BNP (last 3 results) No results for input(s): PROBNP in the last 8760 hours. HbA1C: No results for input(s): HGBA1C in the last 72 hours. CBG: Recent Labs  Lab 04/05/21 1731 04/05/21 2303 04/06/21 0416 04/06/21 0824 04/06/21 1134  GLUCAP 109* 125* 82 74 73   Lipid Profile: Recent Labs  04/05/21 1350  TRIG 41   Thyroid Function Tests: Recent Labs    04/05/21 1808  TSH 2.703   Anemia Panel: Recent Labs    04/05/21 1350  FERRITIN 1,374*   Sepsis Labs: Recent Labs  Lab 04/05/21 1020 04/05/21 1350  PROCALCITON  --  8.21  LATICACIDVEN 2.1* 2.1*    Recent Results (from the past 240 hour(s))  Culture, blood (routine x 2)     Status: None (Preliminary result)   Collection Time: 04/05/21  9:18 AM   Specimen: Right Antecubital; Blood  Result Value Ref Range Status   Specimen Description RIGHT ANTECUBITAL  Final   Special Requests   Final    BOTTLES DRAWN AEROBIC AND ANAEROBIC Blood Culture results may not be optimal due to an inadequate volume of blood  received in culture bottles   Culture   Final    NO GROWTH < 24 HOURS Performed at [facility] [address], [place], [place] [address]    Report Status PENDING  Incomplete  Resp Panel by RT-PCR (Flu A&B, Covid) Nasopharyngeal Swab     Status: Abnormal   Collection Time: 04/05/21  9:18 AM   Specimen: Nasopharyngeal Swab; Nasopharyngeal(NP) swabs in vial transport medium  Result Value Ref Range Status   SARS Coronavirus 2 by RT PCR POSITIVE (A) NEGATIVE Final    Comment: (NOTE) SARS-CoV-2 target nucleic acids are DETECTED.  The SARS-CoV-2 RNA is generally detectable in upper respiratory specimens during the acute phase of infection. Positive results are indicative of the presence of the identified virus, but do not rule out bacterial infection or co-infection with other pathogens not detected by the test. Clinical correlation with patient history and other diagnostic information is necessary to determine patient infection status. The expected result is Negative.  Fact Sheet for Patients: EntrepreneurPulse.com.au  Fact Sheet for Healthcare Providers: IncredibleEmployment.be  This test is not yet approved or cleared by the [place] FDA and  has been authorized for detection and/or diagnosis of SARS-CoV-2 by FDA under an Emergency Use Authorization (EUA).  This EUA will remain in effect (meaning this test can be used) for the duration of  the COVID-19 declaration under Section 564(b)(1) of the A ct, 21 U.S.C. section 360bbb-3(b)(1), unless the authorization is terminated or revoked sooner.     Influenza A by PCR NEGATIVE NEGATIVE Final   Influenza B by PCR NEGATIVE NEGATIVE Final    Comment: (NOTE) The Xpert Xpress SARS-CoV-2/FLU/RSV plus assay is intended as an aid in the diagnosis of influenza from Nasopharyngeal swab specimens and should not be used as a sole basis for treatment. Nasal washings and aspirates are unacceptable  for Xpert Xpress SARS-CoV-2/FLU/RSV testing.  Fact Sheet for Patients: EntrepreneurPulse.com.au  Fact Sheet for Healthcare Providers: IncredibleEmployment.be  This test is not yet approved or cleared by the [place] FDA and has been authorized for detection and/or diagnosis of SARS-CoV-2 by FDA under an Emergency Use Authorization (EUA). This EUA will remain in effect (meaning this test can be used) for the duration of the COVID-19 declaration under Section 564(b)(1) of the Act, 21 U.S.C. section 360bbb-3(b)(1), unless the authorization is terminated or revoked.  Performed at [facility], [place] [address] [place], [place] [address]   Culture, blood (routine x 2)     Status: None (Preliminary result)   Collection Time: 04/05/21 10:22 AM   Specimen: BLOOD RIGHT WRIST  Result Value Ref Range Status   Specimen Description BLOOD RIGHT WRIST  Final  Special Requests   Final    BOTTLES DRAWN AEROBIC ONLY Blood Culture results may not be optimal due to an inadequate volume of blood received in culture bottles   Culture   Final    NO GROWTH < 24 HOURS Performed at [facility] [address] [place], [place] [address]    Report Status PENDING  Incomplete  MRSA Next Gen by PCR, Nasal     Status: Abnormal   Collection Time: 04/06/21 10:50 AM   Specimen: Nasal Mucosa; Nasal Swab  Result Value Ref Range Status   MRSA by PCR Next Gen DETECTED (A) NOT DETECTED Final    Comment: RESULT CALLED TO, READ BACK BY AND VERIFIED WITH: RN R.AGUSTIS ON [id number] AT 61 BY E.PARRISH (NOTE) The GeneXpert MRSA Assay (FDA approved for NASAL specimens only), is one component of a comprehensive MRSA colonization surveillance program. It is not intended to diagnose MRSA infection nor to guide or monitor treatment for MRSA infections. Test performance is not FDA approved in patients less than 35 years old. Performed at [facility], [place] [address] [place],  [address]          Radiology Studies: CT Angio Chest Pulmonary Embolism (PE) W or WO Contrast  Result Date: 04/05/2021 CLINICAL DATA:  Pulmonary embolism (PE) suspected, high prob. Chest pain, cough EXAM: CT ANGIOGRAPHY CHEST WITH CONTRAST TECHNIQUE: Multidetector CT imaging of the chest was performed using the standard protocol during bolus administration of intravenous contrast. Multiplanar CT image reconstructions and MIPs were obtained to evaluate the vascular anatomy. CONTRAST:  53mL OMNIPAQUE IOHEXOL 350 MG/ML SOLN COMPARISON:  05/22/2017 FINDINGS: Cardiovascular: There is adequate opacification of the pulmonary arterial tree. No intraluminal filling defect identified to suggest acute pulmonary embolism. The central pulmonary arteries are enlarged in keeping with pulmonary arterial hypertension. Extensive multi-vessel coronary artery calcification. Moderate cardiomegaly, stable since prior examination. No pericardial effusion. Moderate atherosclerotic calcification within the thoracic aorta. No aortic aneurysm. Left internal jugular hemodialysis catheter tip noted within the superior right atrium. Mediastinum/Nodes: Visualized thyroid is unremarkable. No pathologic thoracic adenopathy. Stable soft tissue nodule within the anterior mediastinum measuring 18 x 23 mm this is nonspecific, but may represent a primary mass such as a thymoma or pathologically enlarged lymph node. The esophagus is unremarkable. Lungs/Pleura: There is dense consolidation of the posterior basal segment of the right lower lobe with extensive airway impaction in keeping with changes of acute lobar pneumonia or aspiration in the appropriate clinical setting. The lungs are otherwise clear. No pneumothorax or pleural effusion. Central airways are widely patent. Upper Abdomen: No acute abnormality. Musculoskeletal: No acute bone abnormality. Degenerative changes are seen within the thoracic spine. Bilateral  total shoulder arthroplasty is partially visualized. Review of the MIP images confirms the above findings. IMPRESSION: No pulmonary embolism. Dense consolidation of the posterior basal right lower lobe in keeping with acute lobar pneumonia or aspiration. No central obstructing lesion. Extensive coronary artery calcification. Stable moderate cardiomegaly. Stable 23 mm soft tissue nodule within the anterior mediastinum. Differential considerations are led by thymoma or pathologic lymph node. This is unchanged in size and configuration, however, since remote prior examination of 05/22/2017. Aortic Atherosclerosis (ICD10-I70.0). Electronically Signed   By: Fidela Salisbury M.D.   On: 04/05/2021 22:52   DG Chest Portable 1 View  Result Date: 04/05/2021 CLINICAL DATA:  Cough EXAM: PORTABLE CHEST 1 VIEW COMPARISON:  Chest radiograph dated Aug 14, 2017 and September 06, 2018 FINDINGS: The heart size and mediastinal  contours are within normal limits. Aorta is tortuous with atherosclerotic calcifications. Left access dialysis catheter with distal tip at the cavoatrial junction, unchanged. Both lungs are clear. Bilateral shoulder arthroplasty. IMPRESSION: No acute cardiopulmonary process. Electronically Signed   By: Keane Police D.O.   On: 04/05/2021 09:28        Scheduled Meds:  vitamin C  500 mg Oral Daily   [START ON 04/07/2021] calcitRIOL  0.75 mcg Oral Q M,W,F-HD   cetirizine  5 mg Oral QPM   Chlorhexidine Gluconate Cloth  6 each Topical Daily   cinacalcet  30 mg Oral Q M,W,F   citalopram  40 mg Oral Daily   docusate sodium  100 mg Oral Daily   ezetimibe  10 mg Oral Daily   [START ON 04/07/2021] famotidine  20 mg Oral Daily   ferric citrate  420 mg Oral TID WC   heparin  5,000 Units Subcutaneous Q8H   heparin sodium (porcine)       hydrALAZINE  10 mg Oral TID   levothyroxine  125 mcg Oral Daily   lidocaine-EPINEPHrine       molnupiravir EUA  4 capsule Oral BID   mometasone-formoterol  2 puff Inhalation BID    multivitamin  1 tablet Oral Daily   pantoprazole  20 mg Oral Daily   pramipexole  0.125 mg Oral QHS   sodium chloride flush  3 mL Intravenous Q12H   zinc sulfate  220 mg Oral Daily   Continuous Infusions:  sodium chloride Stopped (04/06/21 0236)   ceFEPime (MAXIPIME) IV     [START ON 04/07/2021] ceFEPime (MAXIPIME) IV     [START ON 04/07/2021] vancomycin      Assessment & Plan:   Principal Problem:   Sepsis ([place]) Active Problems:   Acquired hypothyroidism   ESRD on hemodialysis ([place])   COPD (chronic obstructive pulmonary disease) ([place])   Anxiety   Do not resuscitate   COVID-19 virus infection   Infection of hemodialysis tunneled catheter ([place])   Elevated troponin   Sepsis secondary infection of ?hemodialysis catheter vs covid vs pna IR evaluated patient, daily did not find any indication for catheter removal/replacement at this time. Leukocytosis increased Blood cultures pending Procalcitonin was elevated Cta with pna v.s aspiration see full result Lactic acid was elevated, CRP elevated Treatment for COVID protocol as below Continue IV antibiotics F/u bcx If bcx positive catheter will need to be removed    COVID-19 infection: Acute.  Continue covid protocal Continue monupiravir, vit c and zn Monitor markers CTA negative for PE     Hypoglycemia: Acute.  On admission glucose was noted to be as low as 65.  She was able to be given with improvement in blood sugar to 85. -Hypoglycemic protocols -1/3 improved eating currently  Monitor       Right-sided chest pain: Acute.   CTA negative for PE      Atrial fibrillation: Patient was noted to be in atrial fibrillation with heart rates initially elevated into the 120s in route with EMS, but currently rate controlled.  Not on any anticoagulation due to prior history of bleeding. -Continue to monitor   ESRD on HD:  HD Monday Wednesday Friday schedule, next HD tomorrow      Essential hypertension:   Stable, Monitor   Elevated troponin: Acute.  Will consult cardiology as cta with  coronary artery calcif. Ck echo   COPD, without acute exacerbation -Continue levocetirizine and pharmacy substitution for Symbicort   Hypothyroidism: TSH 7.11 on  02/23/2021. -Add on TSH -Continue levothyroxine   Depression and anxiety -Continue Celexa   Chronic pain -Continue home medication regimen   DVT prophylaxis: Heparin Code Status: DNR Family Communication: None at bedside Disposition Plan:  Status is: Inpatient  Remains inpatient appropriate because: IV treatment            LOS: 1 day   Time spent 35 minutes with more than 50% on [place], MD [facility] Pager [phone]  If 7PM-7AM, please contact night-coverage 04/06/2021, 3:02 PM

## 2021-04-06 NOTE — Consult Note (Signed)
Chief Complaint: Patient was seen in consultation today for  Chief Complaint  Patient presents with   Emesis    Referring Physician(s): Dr. Royce Macadamia  Supervising Physician: Dr. Denna Haggard  Patient Status: [facility] - In-pt  History of Present Illness: [name] is an 81 y.o. female with a medical history significant for COPD, paroxysmal atrial fibrillation (only on aspirin), anxiety/depression and ESRD on HD. She presented to the ED 04/05/21 with complaints of severe chills with shakes and a non-productive cough. She also complained of nausea/vomiting and redness around the left IJ tunneled dialysis catheter. She has had similar issues in the past and she is familiar to IR from prior catheter placements/removals in the past.   Interventional Radiology has been asked to evaluate this patient for new placement of an image-guided tunneled dialysis catheter placement at a new site.   Past Medical History:  Diagnosis Date   Acute respiratory failure ([place]) 05/2017   Anemia    Anxiety    Arthritis    COPD (chronic obstructive pulmonary disease) (HCC)    Depression    ESRD (end stage renal disease) (HCC)    dialysis MWF   GERD (gastroesophageal reflux disease)    Gout    History of blood transfusion    History of diabetes mellitus    "diet controlled"   HLD (hyperlipidemia)    HOH (hard of hearing)    left ear   Hypertension    hypotensive -since starting dialysis   Hypothyroidism    MRSA (methicillin resistant staph aureus) culture positive 06/01/2017   PAF (paroxysmal atrial fibrillation) ([place])    a. Echo 11/16:  Mild LVH, EF 55-60%, normal wall motion, MAC, mild MR, severe LAE (49 ml/m2), mild RVE, normal RVSF, mild RAE, mild TR, PASP 24 mmHg;  CHADS2-VASc: 4 >> Coumadin followed by PCP    Past Surgical History:  Procedure Laterality Date   AV FISTULA PLACEMENT  04/03/2012   Procedure: ARTERIOVENOUS (AV) FISTULA CREATION;  Surgeon: Elam Dutch, MD;  Location: [place];  Service:  Vascular;  Laterality: Left;  creation left brachial cephalic fistula    BIOPSY  08/21/2018   Procedure: BIOPSY;  Surgeon: Thornton Park, MD;  Location: [place];  Service: Gastroenterology;;   CARPAL TUNNEL RELEASE Bilateral    COLONOSCOPY W/ POLYPECTOMY     COLONOSCOPY WITH PROPOFOL N/A 08/21/2018   Procedure: COLONOSCOPY WITH PROPOFOL;  Surgeon: Thornton Park, MD;  Location: [place];  Service: Gastroenterology;  Laterality: N/A;   DILATION AND CURETTAGE OF UTERUS     ESOPHAGOGASTRODUODENOSCOPY (EGD) WITH PROPOFOL N/A 08/21/2018   Procedure: ESOPHAGOGASTRODUODENOSCOPY (EGD) WITH PROPOFOL;  Surgeon: Thornton Park, MD;  Location: [place];  Service: Gastroenterology;  Laterality: N/A;   EYE SURGERY Bilateral    bilateral cataract removal   HEMATOMA EVACUATION Left 05/14/2018   Procedure: Incision and Drainage of Left Arm Hematoma;  Surgeon: Elam Dutch, MD;  Location: [place];  Service: Vascular;  Laterality: Left;   Hemodialysis  catheter Right    IR FLUORO GUIDE CV LINE RIGHT  05/26/2017   IR REMOVAL TUN CV CATH W/O FL  05/24/2017   IR US GUIDE VASC ACCESS RIGHT  05/26/2017   LIGATION OF ARTERIOVENOUS  FISTULA Left 02/04/2015   Procedure: LIGATION OF BRACHIOCEPHALIC ARTERIOVENOUS  FISTULA;  Surgeon: Conrad Hustler, MD;  Location: [place];  Service: Vascular;  Laterality: Left;   MULTIPLE TOOTH EXTRACTIONS     PARTIAL HIP ARTHROPLASTY     POLYPECTOMY  08/21/2018  Procedure: POLYPECTOMY;  Surgeon: Thornton Park, MD;  Location: [facility] ENDOSCOPY;  Service: Gastroenterology;;   PORTACATH PLACEMENT     REVERSE SHOULDER ARTHROPLASTY Left 01/22/2016   Procedure: LEFT REVERSE SHOULDER ARTHROPLASTY;  Surgeon: Netta Cedars, MD;  Location: [place];  Service: Orthopedics;  Laterality: Left;   SHOULDER ARTHROSCOPY Bilateral    SPLIT NIGHT STUDY  07/26/2015   STERIOD INJECTION Right 01/22/2016   Procedure: RIGHT RING FINGER STEROID INJECTION;  Surgeon: Netta Cedars, MD;  Location: [place];  Service: Orthopedics;  Laterality: Right;   TEE WITHOUT CARDIOVERSION N/A 05/26/2017   Procedure: TRANSESOPHAGEAL ECHOCARDIOGRAM (TEE);  Surgeon: Lelon Perla, MD;  Location: [place];  Service: Cardiovascular;  Laterality: N/A;   THROMBECTOMY BRACHIAL ARTERY Left 02/06/2015   Procedure: EVACUATION OF LEFT ARM HEMATOMA;  Surgeon: Angelia Mould, MD;  Location: [place];  Service: Vascular;  Laterality: Left;   TOTAL KNEE ARTHROPLASTY Bilateral    TUBAL LIGATION      Allergies: Amoxicillin, Elemental sulfur, Sulfur, Peg 3350-electrolytes, Sulfa drugs cross reactors, Miralax [polyethylene glycol], Mixed grasses, Percocet [oxycodone-acetaminophen], and Sulfa antibiotics  Medications: Prior to Admission medications   Medication Sig Start Date End Date Taking? Authorizing Provider  acetaminophen (TYLENOL) 500 MG tablet Take 2 tablets (1,000 mg total) by mouth every 8 (eight) hours as needed for mild pain or headache. 11/02/18  Yes [name], FNP  albuterol (VENTOLIN HFA) 108 (90 Base) MCG/ACT inhaler Inhale 2 puffs into the lungs every 6 (six) hours as needed for wheezing or shortness of breath. 05/19/20  Yes [name], FNP  alendronate (FOSAMAX) 70 MG tablet Take 1 tablet (70 mg total) by mouth every Thursday. 05/21/20  Yes [name], FNP  AURYXIA 1 GM 210 MG(Fe) tablet Take 420 mg by mouth 3 (three) times daily with meals. 06/18/20  Yes [provider]  budesonide-formoterol (SYMBICORT) 80-4.5 MCG/ACT inhaler Inhale 2 puffs into the lungs 2 (two) times daily. 05/19/20  Yes [name] F, FNP  cinacalcet (SENSIPAR) 30 MG tablet Take 1 tablet (30 mg total) by mouth every Monday, Wednesday, and Friday. 05/28/17  Yes Allie Bossier, MD  citalopram (CELEXA) 40 MG tablet TAKE 1 TABLET BY MOUTH EVERY DAY Patient taking differently: Take 40 mg by mouth daily. 03/22/21  Yes [name] F, FNP  docusate calcium (SURFAK) 240 MG capsule Take 1 capsule (240 mg  total) by mouth daily. 11/19/20  Yes [name] F, FNP  doxercalciferol (HECTOROL) 4 MCG/2ML injection Inject 0.5 mLs (1 mcg total) into the vein every Monday, Wednesday, and Friday with hemodialysis. 05/29/17  Yes Allie Bossier, MD  ezetimibe (ZETIA) 10 MG tablet Take 1 tablet (10 mg total) by mouth daily. 05/19/20  Yes [name], FNP  famotidine (PEPCID) 20 MG tablet Take 1 tablet (20 mg total) by mouth 2 (two) times daily. 11/19/20  Yes [name] F, FNP  hydrALAZINE (APRESOLINE) 10 MG tablet Take 1 tablet (10 mg total) by mouth 3 (three) times daily. 02/23/21  Yes [name] F, FNP  HYDROcodone-acetaminophen (NORCO) 7.5-325 MG tablet Take 1 tablet by mouth every 6 (six) hours as needed for moderate pain. 04/24/21  Yes [name] F, FNP  levocetirizine (XYZAL) 5 MG tablet TAKE 1 TABLET BY MOUTH EVERY DAY IN THE EVENING Patient taking differently: Take 5 mg by mouth every evening. 11/26/20  Yes [name], FNP  levothyroxine (SYNTHROID) 125 MCG tablet Take 1 tablet (125 mcg total) by mouth daily. 02/24/21  Yes  [name], FNP  multivitamin (RENA-VIT) TABS tablet Take 1 tablet by mouth daily.   Yes [provider]  mupirocin cream (BACTROBAN) 2 % Apply 1 application topically 2 (two) times daily. 01/01/21  Yes Ivy Lynn, NP  nystatin cream (MYCOSTATIN) Apply 1 application topically 2 (two) times daily as needed for dry skin. 08/18/20  Yes [name] F, FNP  pantoprazole (PROTONIX) 20 MG tablet Take 1 tablet (20 mg total) by mouth daily. 11/19/20  Yes [name] F, FNP  pramipexole (MIRAPEX) 0.125 MG tablet Take 1 tablet (0.125 mg total) by mouth at bedtime. 02/23/21  Yes [name] F, FNP  HYDROcodone-acetaminophen (NORCO) 7.5-325 MG tablet Take 1 tablet by mouth every 6 (six) hours as needed for moderate pain. Patient not taking: Reported on 04/05/2021 02/23/21   [name], FNP  HYDROcodone-acetaminophen (NORCO) 7.5-325 MG tablet Take 1  tablet by mouth every 6 (six) hours as needed for moderate pain. Patient not taking: Reported on 04/05/2021 03/25/21   [name], FNP     Family History  Problem Relation Age of Onset   Arthritis Mother    Diabetes Father        before age 24   Heart disease Father    Diabetes Sister    Cancer Brother    Hyperlipidemia Daughter    Hypertension Daughter    Diabetes Daughter    Hypertension Son    Hyperlipidemia Son    Dementia Sister    Cancer Sister        Unknown   Other Sister        Died in car accident   Stroke Brother    Diabetes Daughter    Hypertension Daughter    Hyperlipidemia Daughter    Hepatitis C Son     Social History   Socioeconomic History   Marital status: Widowed    Spouse name: Not on file   Number of children: 6   Years of education: 10   Highest education level: 10th grade  Occupational History   Occupation: RETIRED  Tobacco Use   Smoking status: Former    Packs/day: 0.25    Years: 2.00    Pack years: 0.50    Types: Cigarettes    Quit date: 04/04/1998    Years since quitting: 23.0   Smokeless tobacco: Never  Vaping Use   Vaping Use: Never used  Substance and Sexual Activity   Alcohol use: No    Alcohol/week: 0.0 standard drinks   Drug use: No   Sexual activity: Not Currently    Birth control/protection: None  Other Topics Concern   Not on file  Social History Narrative   ** Merged History Encounter ** Widowed   6 children   Lives with son and daughter in [place]   ESRD - dialysis 3x per week   Cognitive impairment   Social Determinants of Health   Financial Resource Strain: Low Risk    Difficulty of Paying Living Expenses: Not hard at all  Food Insecurity: No Food Insecurity   Worried About Charity fundraiser in the Last Year: Never true   Arboriculturist in the Last Year: Never true  Transportation Needs: No Transportation Needs   Lack of Transportation (Medical): No   Lack of Transportation (Non-Medical): No   Physical Activity: Inactive   Days of Exercise per Week: 0 days   Minutes of Exercise per Session: 0 min  Stress: No Stress Concern  Present   Feeling of Stress : Only a little  Social Connections: Moderately Isolated   Frequency of Communication with Friends and Family: More than three times a week   Frequency of Social Gatherings with Friends and Family: More than three times a week   Attends Religious Services: 1 to 4 times per year   Active Member of [employer] or Organizations: No   Attends Archivist Meetings: Never   Marital Status: Widowed    Review of Systems: A 12 point ROS discussed and pertinent positives are indicated in the HPI above.  All other systems are negative.  Review of Systems  Constitutional:  Positive for fatigue. Negative for appetite change.  Respiratory:  Negative for cough and shortness of breath.   Gastrointestinal:  Negative for diarrhea, nausea and vomiting.  Neurological:  Negative for headaches.   Vital Signs: BP 103/72 (BP Location: Right Arm)    Pulse 80    Temp 98 F (36.7 C)    Resp 18    Ht _0  (1.651 m)    Wt 120 lb (54.4 kg)    SpO2 97%    BMI 19.97 kg/m   Physical Exam Constitutional:      General: She is not in acute distress. HENT:     Mouth/Throat:     Mouth: Mucous membranes are moist.     Pharynx: Oropharynx is clear.  Cardiovascular:     Comments: Left IJ tunneled HD catheter. Site has dried drainage, no erythema. Surrounding tissue soft purple. Patient endorses mild tenderness.  Pulmonary:     Effort: Pulmonary effort is normal.  Neurological:     Mental Status: She is alert and oriented to person, place, and time.    Imaging: CT Angio Chest Pulmonary Embolism (PE) W or WO Contrast  Result Date: 04/05/2021 CLINICAL DATA:  Pulmonary embolism (PE) suspected, high prob. Chest pain, cough EXAM: CT ANGIOGRAPHY CHEST WITH CONTRAST TECHNIQUE: Multidetector CT imaging of the chest was performed using the standard protocol  during bolus administration of intravenous contrast. Multiplanar CT image reconstructions and MIPs were obtained to evaluate the vascular anatomy. CONTRAST:  2m OMNIPAQUE IOHEXOL 350 MG/ML SOLN COMPARISON:  05/22/2017 FINDINGS: Cardiovascular: There is adequate opacification of the pulmonary arterial tree. No intraluminal filling defect identified to suggest acute pulmonary embolism. The central pulmonary arteries are enlarged in keeping with pulmonary arterial hypertension. Extensive multi-vessel coronary artery calcification. Moderate cardiomegaly, stable since prior examination. No pericardial effusion. Moderate atherosclerotic calcification within the thoracic aorta. No aortic aneurysm. Left internal jugular hemodialysis catheter tip noted within the superior right atrium. Mediastinum/Nodes: Visualized thyroid is unremarkable. No pathologic thoracic adenopathy. Stable soft tissue nodule within the anterior mediastinum measuring 18 x 23 mm this is nonspecific, but may represent a primary mass such as a thymoma or pathologically enlarged lymph node. The esophagus is unremarkable. Lungs/Pleura: There is dense consolidation of the posterior basal segment of the right lower lobe with extensive airway impaction in keeping with changes of acute lobar pneumonia or aspiration in the appropriate clinical setting. The lungs are otherwise clear. No pneumothorax or pleural effusion. Central airways are widely patent. Upper Abdomen: No acute abnormality. Musculoskeletal: No acute bone abnormality. Degenerative changes are seen within the thoracic spine. Bilateral total shoulder arthroplasty is partially visualized. Review of the MIP images confirms the above findings. IMPRESSION: No pulmonary embolism. Dense consolidation of the posterior basal right lower lobe in keeping with acute lobar pneumonia or aspiration. No central obstructing lesion. Extensive coronary artery calcification.  Stable moderate cardiomegaly. Stable 23  mm soft tissue nodule within the anterior mediastinum. Differential considerations are led by thymoma or pathologic lymph node. This is unchanged in size and configuration, however, since remote prior examination of 05/22/2017. Aortic Atherosclerosis (ICD10-I70.0). Electronically Signed   By: Fidela Salisbury M.D.   On: 04/05/2021 22:52   DG Chest Portable 1 View  Result Date: 04/05/2021 CLINICAL DATA:  Cough EXAM: PORTABLE CHEST 1 VIEW COMPARISON:  Chest radiograph dated Aug 14, 2017 and September 06, 2018 FINDINGS: The heart size and mediastinal contours are within normal limits. Aorta is tortuous with atherosclerotic calcifications. Left access dialysis catheter with distal tip at the cavoatrial junction, unchanged. Both lungs are clear. Bilateral shoulder arthroplasty. IMPRESSION: No acute cardiopulmonary process. Electronically Signed   By: Keane Police D.O.   On: 04/05/2021 09:28    Labs:  CBC: Recent Labs    08/18/20 1433 02/23/21 1204 04/05/21 0918 04/06/21 0202  WBC 8.2 8.1 15.9* 24.0*  HGB 11.8 11.2 11.2* 10.7*  HCT 36.8 34.2 35.3* 33.2*  PLT 305 326 218 205    COAGS: No results for input(s): INR, APTT in the last 8760 hours.  BMP: Recent Labs    05/19/20 1502 07/15/20 0504 07/16/20 0444 07/17/20 0406 08/18/20 1433 02/23/21 1204 04/05/21 0918 04/06/21 0202  NA 145*   < > 140 139 145* 141 139 135  K 4.5   < > 3.5 4.5 5.7* 4.8 4.1 4.9  CL 99   < > 102 102 103 99 102 102  CO2 19*   < > _0 21*  GLUCOSE 111*   < > 84 88 74 77 65* 90  BUN 31*   < > 13 26* 35* 19 32* 48*  CALCIUM 8.6*   < > 8.1* 8.2* 9.6 8.6* 8.3* 7.7*  CREATININE 5.20*   < > 3.99* 5.75* 5.32* 3.96* 5.35* 6.03*  GFRNONAA 7*   < > 11* 7*  --   --  8* 7*  GFRAA 8*  --   --   --   --   --   --   --    < > = values in this interval not displayed.    LIVER FUNCTION TESTS: Recent Labs    08/18/20 1433 02/23/21 1204 04/05/21 0918 04/06/21 0202  BILITOT 0.2 0.3 1.0 0.8  AST 20 24 34 34  ALT _1 ALKPHOS 93 91 61 69  PROT 6.5 6.4 5.7* 5.2*  ALBUMIN 3.6* 3.6* 2.8* 2.2*    TUMOR MARKERS: No results for input(s): AFPTM, CEA, CA199, CHROMGRNA in the last 8760 hours.  Assessment and Plan:  Erythema/infection at left chest dialysis catheter site: Michelle L. 7, 81 year old female, presented to the [facility] department for evaluation of the left IJ tunneled dialysis catheter with possible placement of a new dialysis catheter at a separate site. The current catheter site is without erythema or active drainage. No signs to suggest infection at the catheter site. Patient does endorse mild tenderness and there is dried drainage around the line.   Dr. Denna Haggard spoke with Dr. Royce Macadamia and the plan is to leave the current catheter in place with re-evaluation at a later date if necessary. No IR procedure planned today. The patient was informed and will be sent back to her room.   Thank you for this interesting consult.  I greatly enjoyed meeting [name] and look forward to participating in their care.  A copy of this report was sent to the requesting provider on this date.  Electronically Signed: Soyla Dryer, AGACNP-BC [phone] 04/06/2021, 9:00 AM   I spent a total of 20 Minutes   in face to face in clinical consultation, greater than 50% of which was counseling/coordinating care for tunneled dialysis catheter placement

## 2021-04-06 NOTE — Plan of Care (Signed)
°  Problem: Education: Goal: Knowledge of disease and its progression will improve Outcome: Progressing   Problem: Fluid Volume: Goal: Compliance with measures to maintain balanced fluid volume will improve 04/06/2021 0723 by Dolores Hoose, RN Outcome: Progressing 04/05/2021 1901 by Dolores Hoose, RN Outcome: Progressing   Problem: Nutritional: Goal: Ability to make healthy dietary choices will improve 04/06/2021 0723 by Dolores Hoose, RN Outcome: Progressing 04/05/2021 1901 by Dolores Hoose, RN Outcome: Progressing   Problem: Clinical Measurements: Goal: Complications related to the disease process, condition or treatment will be avoided or minimized Outcome: Progressing

## 2021-04-06 NOTE — Progress Notes (Signed)
TRH night cross cover note:  I was contacted by RN for clarification regarding duration of current order for every 2 hours CBG checks.   Per my chart review, it appears that the patient exhibited some preliminary evidence of hypoglycemia, with initial blood sugar in the 70s, prompting admitting physician to order every 2 hours CBG checks x4 occurrences.  Subsequently, it appears that the patient has undergone 3 ensuing CBG checks, each with increasing blood sugars, with 2 most recent blood sugars noted to be in the 120s.  Consequently, I have placed order to change current CBG checks frequency from q2 hours to every 4 hours x4 additional occurrences, effective immediately.   Babs Bertin, DO Hospitalist

## 2021-04-06 NOTE — Progress Notes (Signed)
Pt receives out-pt HD at [facility] on MWF. Pt arrives at 5:25 for 5:45 chair time. Spoke to clinic manager, Elberta Fortis, to make him aware of pt's covid diagnosis. Pt's days/time will remain the same at d/c. Will update clinic on d/c date once known. Will assist as needed.   Melven Sartorius Renal Navigator [phone]

## 2021-04-06 NOTE — Progress Notes (Signed)
[facility] Progress Note  Name: [name] MRN: [id number] DOB: April 21, 1940  Subjective:  last HD prior to admission on 1/2.  IR has assessed her catheter and they do not feel that it warrants removal.     Review of systems:  Reports breathing is ok  Some nausea overnight  Denies chest pain  ------------------  Background on consult:  [name] is a 81 y.o. female with a history of ESRD on HD, COPD, GERD, HTN, and p afib who had HD today and was sent to the ER from the HD unit.  She had 1 hour and 11 minutes of HD when she began shaking and vomited per charting.  Treatment was ended prematurely.  She was found to be covid positive.  Nephrology is consulted for assistance with management of HD.   Note per charting EMS reported Afib with rate of 80's to 120's during transport here.  There is concern about her catheter site as well - this has been red and her family is concerned about an infection.  CXR with no acute cardiopulmonary process.  She left at 53 kg today from HD and left at 51.5 kg her last treatment.  Spoke with her daughter in law at bedside who pt states is also her power of attorney.  She states that her breathing is doing ok.  Nausea and vomiting earlier today.  She does ok with her fluids.   Intake/Output Summary (Last 24 hours) at 04/06/2021 1250 Last data filed at 04/06/2021 1043 Gross per 24 hour  Intake 685.97 ml  Output 0 ml  Net 685.97 ml    Vitals:  Vitals:   04/05/21 2053 04/06/21 0153 04/06/21 0418 04/06/21 0817  BP:  102/67 95/80 103/72  Pulse:  73 75 80  Resp:  16 18 18   Temp:  98.1 F (36.7 C) 97.9 F (36.6 C) 98 F (36.7 C)  TempSrc:      SpO2: 95% 95% 96% 97%  Weight:      Height:         Physical Exam:  General: elderly female in bed eating lunch HEENT: NCAT  Eyes: EOMI sclera anicteric Neck: supple trachea midline  Heart: S1S2 no rub Lungs:  clear and unlabored on room air  Abdomen:  soft/NT/ND Extremities: no edema no  cyanosis or clubbing Skin: no rash on extremities exposed Neuro: alert and oriented x 3 provides hx and follows commands Access Left IJ tunn catheter; catheter exit site with large scab and dried skin; no erythema  Medications reviewed   Labs:  BMP Latest Ref Rng & Units 04/06/2021 04/05/2021 02/23/2021  Glucose 70 - 99 mg/dL 90 65(L) 77  BUN 8 - 23 mg/dL 48(H) 32(H) 19  Creatinine 0.44 - 1.00 mg/dL 6.03(H) 5.35(H) 3.96(HH)  BUN/Creat Ratio 12 - 28 - - 5(L)  Sodium 135 - 145 mmol/L 135 139 141  Potassium 3.5 - 5.1 mmol/L 4.9 4.1 4.8  Chloride 98 - 111 mmol/L 102 102 99  CO2 22 - 32 mmol/L 21(L) 25 26  Calcium 8.9 - 10.3 mg/dL 7.7(L) 8.3(L) 8.6(L)   Outpatient HD orders:  [facility] 3.5 hours  MWF BF 400/ DF 500 EDW 52 kg 2 k/2.5 Ca Tunn catheter Calcitriol 0.75 mcg three times a week Last Rec'd mircera 50 mcg on 12/21 and this is no longer ordered  Assessment/Plan:   # ESRD  - HD per MWF schedule  - Plan for next HD on 1/4, Wednesday    # Covid  positive - therapies per primary team    # paroxysmal a fib - per primary team    # Anemia CKD  - Defer ESA though may need soon; last rec'd on 12/21    # Concern for catheter exit site infection  - note on vanc and aztreonam per primary team. From my standpoint would continue the vanc. Aztreonam is per primary team discretion - blood cultures pending - NGTD - IR consulted and they feel exit site looks good and IR has deferred catheter removal  - leukocytosis increasing - if blood cultures positive catheter will need to be removed - RN is dressing the site   # metabolic bone disease  - continue calcitriol and on sensipar at home per charting; was reordered here. Phos acceptable   Disposition - follow cultures to guide  Claudia Desanctis, MD 04/06/2021 1:27 PM

## 2021-04-07 ENCOUNTER — Inpatient Hospital Stay (HOSPITAL_COMMUNITY): Payer: Medicare Other

## 2021-04-07 DIAGNOSIS — A419 Sepsis, unspecified organism: Secondary | ICD-10-CM | POA: Diagnosis not present

## 2021-04-07 DIAGNOSIS — E039 Hypothyroidism, unspecified: Secondary | ICD-10-CM | POA: Diagnosis not present

## 2021-04-07 DIAGNOSIS — U071 COVID-19: Secondary | ICD-10-CM | POA: Diagnosis not present

## 2021-04-07 DIAGNOSIS — F419 Anxiety disorder, unspecified: Secondary | ICD-10-CM | POA: Diagnosis not present

## 2021-04-07 DIAGNOSIS — R778 Other specified abnormalities of plasma proteins: Secondary | ICD-10-CM | POA: Diagnosis not present

## 2021-04-07 DIAGNOSIS — R079 Chest pain, unspecified: Secondary | ICD-10-CM

## 2021-04-07 LAB — CBC WITH DIFFERENTIAL/PLATELET
Abs Immature Granulocytes: 0.07 10*3/uL (ref 0.00–0.07)
Basophils Absolute: 0.1 10*3/uL (ref 0.0–0.1)
Basophils Relative: 1 %
Eosinophils Absolute: 0.4 10*3/uL (ref 0.0–0.5)
Eosinophils Relative: 2 %
HCT: 32.1 % — ABNORMAL LOW (ref 36.0–46.0)
Hemoglobin: 10.1 g/dL — ABNORMAL LOW (ref 12.0–15.0)
Immature Granulocytes: 0 %
Lymphocytes Relative: 6 %
Lymphs Abs: 1.1 10*3/uL (ref 0.7–4.0)
MCH: 30.3 pg (ref 26.0–34.0)
MCHC: 31.5 g/dL (ref 30.0–36.0)
MCV: 96.4 fL (ref 80.0–100.0)
Monocytes Absolute: 0.8 10*3/uL (ref 0.1–1.0)
Monocytes Relative: 4 %
Neutro Abs: 14.9 10*3/uL — ABNORMAL HIGH (ref 1.7–7.7)
Neutrophils Relative %: 87 %
Platelets: 237 10*3/uL (ref 150–400)
RBC: 3.33 MIL/uL — ABNORMAL LOW (ref 3.87–5.11)
RDW: 15.9 % — ABNORMAL HIGH (ref 11.5–15.5)
WBC: 17.3 10*3/uL — ABNORMAL HIGH (ref 4.0–10.5)
nRBC: 0 % (ref 0.0–0.2)

## 2021-04-07 LAB — MAGNESIUM: Magnesium: 2.2 mg/dL (ref 1.7–2.4)

## 2021-04-07 LAB — COMPREHENSIVE METABOLIC PANEL
ALT: 18 U/L (ref 0–44)
AST: 21 U/L (ref 15–41)
Albumin: 2.2 g/dL — ABNORMAL LOW (ref 3.5–5.0)
Alkaline Phosphatase: 84 U/L (ref 38–126)
Anion gap: 11 (ref 5–15)
BUN: 64 mg/dL — ABNORMAL HIGH (ref 8–23)
CO2: 23 mmol/L (ref 22–32)
Calcium: 8 mg/dL — ABNORMAL LOW (ref 8.9–10.3)
Chloride: 103 mmol/L (ref 98–111)
Creatinine, Ser: 7.46 mg/dL — ABNORMAL HIGH (ref 0.44–1.00)
GFR, Estimated: 5 mL/min — ABNORMAL LOW (ref >60)
Glucose, Bld: 67 mg/dL — ABNORMAL LOW (ref 70–99)
Potassium: 4.5 mmol/L (ref 3.5–5.1)
Sodium: 137 mmol/L (ref 135–145)
Total Bilirubin: 0.7 mg/dL (ref 0.3–1.2)
Total Protein: 5.3 g/dL — ABNORMAL LOW (ref 6.5–8.1)

## 2021-04-07 LAB — PHOSPHORUS: Phosphorus: 3.5 mg/dL (ref 2.5–4.6)

## 2021-04-07 LAB — GLUCOSE, CAPILLARY
Glucose-Capillary: 63 mg/dL — ABNORMAL LOW (ref 70–99)
Glucose-Capillary: 70 mg/dL (ref 70–99)
Glucose-Capillary: 88 mg/dL (ref 70–99)
Glucose-Capillary: 86 mg/dL (ref 70–99)
Glucose-Capillary: 112 mg/dL — ABNORMAL HIGH (ref 70–99)

## 2021-04-07 LAB — ECHOCARDIOGRAM COMPLETE
Height: 65 in
Weight: 1904.77 oz

## 2021-04-07 LAB — HEPATITIS B SURFACE ANTIGEN: Hepatitis B Surface Ag: NONREACTIVE

## 2021-04-07 LAB — HEPATITIS B SURFACE ANTIBODY,QUALITATIVE: Hep B S Ab: NONREACTIVE

## 2021-04-07 MED ORDER — PENTAFLUOROPROP-TETRAFLUOROETH EX AERO: 1.0000 "application " | INHALATION_SPRAY | CUTANEOUS | Status: DC | PRN

## 2021-04-07 MED ORDER — LIDOCAINE HCL (PF) 1 % IJ SOLN: 5.0000 mL | INTRAMUSCULAR | Status: DC | PRN

## 2021-04-07 MED ORDER — SODIUM CHLORIDE 0.9 % IV SOLN: 100.0000 mL | INTRAVENOUS | Status: DC | PRN

## 2021-04-07 MED ORDER — HEPARIN SODIUM (PORCINE) 1000 UNIT/ML DIALYSIS
1000.0000 [IU] | INTRAMUSCULAR | Status: DC | PRN
  Administered 2021-04-07: 1000 [IU] via INTRAVENOUS_CENTRAL
  Filled 2021-04-07: qty 1

## 2021-04-07 MED ORDER — LIDOCAINE-PRILOCAINE 2.5-2.5 % EX CREA: 1.0000 "application " | TOPICAL_CREAM | CUTANEOUS | Status: DC | PRN

## 2021-04-07 MED ORDER — DARBEPOETIN ALFA 40 MCG/0.4ML IJ SOSY
40.0000 ug | PREFILLED_SYRINGE | INTRAMUSCULAR | Status: DC
  Administered 2021-04-07: 40 ug via INTRAVENOUS
  Filled 2021-04-07: qty 0.4

## 2021-04-07 MED ORDER — ALTEPLASE 2 MG IJ SOLR: 2.0000 mg | Freq: Once | INTRAMUSCULAR | Status: DC | PRN

## 2021-04-07 NOTE — Progress Notes (Signed)
Pt had blood sugar 63, given 2 OJ and fig newton rechecked sugar is 70 No s/s of hypoglycemia Louanne Skye

## 2021-04-07 NOTE — Progress Notes (Signed)
[facility] Progress Note  Name: [name] MRN: [id number] DOB: [birth year]  Subjective:  Seen on dialysis.  Blood pressure 104/59 and HR 75.  Tolerating goal.  Left IJ tunn catheter in use.   ------------------  Background on consult:  [name] is a 81 y.o. female with a history of ESRD on HD, COPD, GERD, HTN, and p afib who had HD today and was sent to the ER from the HD unit.  She had 1 hour and 11 minutes of HD when she began shaking and vomited per charting.  Treatment was ended prematurely.  She was found to be covid positive.  Nephrology is consulted for assistance with management of HD.   Note per charting EMS reported Afib with rate of 80's to 120's during transport here.  There is concern about her catheter site as well - this has been red and her family is concerned about an infection.  CXR with no acute cardiopulmonary process.  She left at 53 kg today from HD and left at 51.5 kg her last treatment.  Spoke with her daughter in law at bedside who pt states is also her power of attorney.  She states that her breathing is doing ok.  Nausea and vomiting earlier today.  She does ok with her fluids.   Intake/Output Summary (Last 24 hours) at 04/07/2021 1000 Last data filed at 04/06/2021 2052 Gross per 24 hour  Intake 470.53 ml  Output 0 ml  Net 470.53 ml    Vitals:  Vitals:   04/06/21 0418 04/06/21 0817 04/06/21 1615 04/06/21 2052  BP: 95/80 103/72 114/74 110/73  Pulse: 75 80 81 85  Resp: 18 18 18 18   Temp: 97.9 F (36.6 C) 98 F (36.7 C) 98.6 F (37 C) 98.9 F (37.2 C)  TempSrc:      SpO2: 96% 97% 97% 91%  Weight:      Height:         Physical Exam:  General: elderly female in bed in NAD - examined via window on HD and discussed with HD RN HEENT: NCAT  Neck: supple trachea midline  Lungs:  unlabored lying in bed with HOB slightly elevated. on room air  Abdomen:  soft/NT/ND Extremities: no edema per nursing  Access Left IJ tunn catheter; catheter exit  site with large scab and dried skin; no erythema. Confirmed again with nursing today   Medications reviewed   Labs:  BMP Latest Ref Rng & Units 04/07/2021 04/06/2021 04/05/2021  Glucose 70 - 99 mg/dL 67(L) 90 65(L)  BUN 8 - 23 mg/dL 64(H) 48(H) 32(H)  Creatinine 0.44 - 1.00 mg/dL 7.46(H) 6.03(H) 5.35(H)  BUN/Creat Ratio 12 - 28 - - -  Sodium 135 - 145 mmol/L 137 135 139  Potassium 3.5 - 5.1 mmol/L 4.5 4.9 4.1  Chloride 98 - 111 mmol/L 103 102 102  CO2 22 - 32 mmol/L 23 21(L) 25  Calcium 8.9 - 10.3 mg/dL 8.0(L) 7.7(L) 8.3(L)   Outpatient HD orders:  [facility]  MWF BF 400/ DF 500 EDW 52 kg 2 k/2.5 Ca Tunn catheter Calcitriol 0.75 mcg three times a week Last Rec'd mircera 50 mcg on 12/21 and this is no longer ordered  Assessment/Plan:   # ESRD  - HD per MWF schedule    # Covid positive - therapies per primary team    # paroxysmal a fib - per primary team    # Anemia CKD  - aranesp 40 mcg weekly - ordered  for wed.    # Concern for catheter exit site infection  - note on vanc and aztreonam per primary team. From my standpoint would continue the vanc. Aztreonam is per primary team discretion - blood cultures pending - NGTD  - IR consulted and they feel exit site looks good and IR has deferred catheter removal    # metabolic bone disease  - continue calcitriol and on sensipar at home per charting; was reordered here. Phos acceptable   Disposition - follow cultures to guide  Claudia Desanctis, MD 04/07/2021 10:12 AM

## 2021-04-07 NOTE — Progress Notes (Signed)
PROGRESS NOTE    [name]  KVQ:[id number] DOB: October 17, 1940 DOA: 04/05/2021 PCP: [name], FNP    Brief Narrative:  [name] is a 81 y.o. female with medical history significant of ESRD on HD, COPD, paroxysmal atrial fibrillation on aspirin, anemia of chronic disease, hypothyroidism, anxiety, and depression who presents after having severe chills with shakes while at dialysis.  She had complained of some chills this morning, but chronically cold natured so she thought nothing of it.  Patient has had a nonproductive cough that has gotten deeper over the last 2 days and complained of some right-sided chest pain especially with taking a deep breath. She had received an hour of hemodialysis prior to onset of chills and shakes.  1/4 had an extensive conversation with daughter updated about plans and her current status all questions were answered  Consultants:  IR, nephrology  Procedures:   Antimicrobials:  Cefepime, vanco   Subjective: No complaints. States not very hungry. No sob  Objective: Vitals:   04/06/21 0418 04/06/21 0817 04/06/21 1615 04/06/21 2052  BP: 95/80 103/72 114/74 110/73  Pulse: 75 80 81 85  Resp: 18 18 18 18   Temp: 97.9 F (36.6 C) 98 F (36.7 C) 98.6 F (37 C) 98.9 F (37.2 C)  TempSrc:      SpO2: 96% 97% 97% 91%  Weight:      Height:        Intake/Output Summary (Last 24 hours) at 04/07/2021 0908 Last data filed at 04/06/2021 2052 Gross per 24 hour  Intake 470.53 ml  Output 0 ml  Net 470.53 ml   Filed Weights   04/05/21 0852  Weight: 54.4 kg    Examination: Nad in HD Anteriorly clear to auscultation no wheezing Regular S1-S2 no gallops Soft benign positive bowel sounds No edema Alert and awake grossly intact    Data Reviewed: I have personally reviewed following labs and imaging studies  CBC: Recent Labs  Lab 04/05/21 0918 04/06/21 0202 04/07/21 0550  WBC 15.9* 24.0* 17.3*  NEUTROABS 14.2* 21.8* 14.9*  HGB 11.2* 10.7*  10.1*  HCT 35.3* 33.2* 32.1*  MCV 98.3 96.2 96.4  PLT 218 205 643   Basic Metabolic Panel: Recent Labs  Lab 04/05/21 0918 04/06/21 0202 04/07/21 0550  NA 139 135 137  K 4.1 4.9 4.5  CL 102 102 103  CO2 25 21* 23  GLUCOSE 65* 90 67*  BUN 32* 48* 64*  CREATININE 5.35* 6.03* 7.46*  CALCIUM 8.3* 7.7* 8.0*  MG  --  2.0 2.2  PHOS  --  3.3 3.5   GFR: Estimated Creatinine Clearance: 5.2 mL/min (A) (by C-G formula based on SCr of 7.46 mg/dL (H)). Liver Function Tests: Recent Labs  Lab 04/05/21 0918 04/06/21 0202 04/07/21 0550  AST 34 34 21  ALT 20 18 18   ALKPHOS 61 69 84  BILITOT 1.0 0.8 0.7  PROT 5.7* 5.2* 5.3*  ALBUMIN 2.8* 2.2* 2.2*   No results for input(s): LIPASE, AMYLASE in the last 168 hours. No results for input(s): AMMONIA in the last 168 hours. Coagulation Profile: No results for input(s): INR, PROTIME in the last 168 hours. Cardiac Enzymes: No results for input(s): CKTOTAL, CKMB, CKMBINDEX, TROPONINI in the last 168 hours. BNP (last 3 results) No results for input(s): PROBNP in the last 8760 hours. HbA1C: No results for input(s): HGBA1C in the last 72 hours. CBG: Recent Labs  Lab 04/06/21 1134 04/06/21 1613 04/07/21 0646 04/07/21 0659 04/07/21 0820  GLUCAP 73  91 63* 70 88   Lipid Profile: Recent Labs    04/05/21 1350  TRIG 41   Thyroid Function Tests: Recent Labs    04/05/21 1808  TSH 2.703   Anemia Panel: Recent Labs    04/05/21 1350  FERRITIN 1,374*   Sepsis Labs: Recent Labs  Lab 04/05/21 1020 04/05/21 1350  PROCALCITON  --  8.21  LATICACIDVEN 2.1* 2.1*    Recent Results (from the past 240 hour(s))  Culture, blood (routine x 2)     Status: None (Preliminary result)   Collection Time: 04/05/21  9:18 AM   Specimen: Right Antecubital; Blood  Result Value Ref Range Status   Specimen Description RIGHT ANTECUBITAL  Final   Special Requests   Final    BOTTLES DRAWN AEROBIC AND ANAEROBIC Blood Culture results may not be optimal  due to an inadequate volume of blood received in culture bottles   Culture   Final    NO GROWTH 2 DAYS Performed at [facility] [address], [place], [place] [address]    Report Status PENDING  Incomplete  Resp Panel by RT-PCR (Flu A&B, Covid) Nasopharyngeal Swab     Status: Abnormal   Collection Time: 04/05/21  9:18 AM   Specimen: Nasopharyngeal Swab; Nasopharyngeal(NP) swabs in vial transport medium  Result Value Ref Range Status   SARS Coronavirus 2 by RT PCR POSITIVE (A) NEGATIVE Final    Comment: (NOTE) SARS-CoV-2 target nucleic acids are DETECTED.  The SARS-CoV-2 RNA is generally detectable in upper respiratory specimens during the acute phase of infection. Positive results are indicative of the presence of the identified virus, but do not rule out bacterial infection or co-infection with other pathogens not detected by the test. Clinical correlation with patient history and other diagnostic information is necessary to determine patient infection status. The expected result is Negative.  Fact Sheet for Patients: EntrepreneurPulse.com.au  Fact Sheet for Healthcare Providers: IncredibleEmployment.be  This test is not yet approved or cleared by the [place] FDA and  has been authorized for detection and/or diagnosis of SARS-CoV-2 by FDA under an Emergency Use Authorization (EUA).  This EUA will remain in effect (meaning this test can be used) for the duration of  the COVID-19 declaration under Section 564(b)(1) of the A ct, 21 U.S.C. section 360bbb-3(b)(1), unless the authorization is terminated or revoked sooner.     Influenza A by PCR NEGATIVE NEGATIVE Final   Influenza B by PCR NEGATIVE NEGATIVE Final    Comment: (NOTE) The Xpert Xpress SARS-CoV-2/FLU/RSV plus assay is intended as an aid in the diagnosis of influenza from Nasopharyngeal swab specimens and should not be used as a sole basis for treatment. Nasal washings  and aspirates are unacceptable for Xpert Xpress SARS-CoV-2/FLU/RSV testing.  Fact Sheet for Patients: EntrepreneurPulse.com.au  Fact Sheet for Healthcare Providers: IncredibleEmployment.be  This test is not yet approved or cleared by the [place] FDA and has been authorized for detection and/or diagnosis of SARS-CoV-2 by FDA under an Emergency Use Authorization (EUA). This EUA will remain in effect (meaning this test can be used) for the duration of the COVID-19 declaration under Section 564(b)(1) of the Act, 21 U.S.C. section 360bbb-3(b)(1), unless the authorization is terminated or revoked.  Performed at [facility], [place] [address], [place],  [address]   Culture, blood (routine x 2)     Status: None (Preliminary result)   Collection Time: 04/05/21 10:22 AM   Specimen: BLOOD RIGHT WRIST  Result Value Ref  Range Status   Specimen Description BLOOD RIGHT WRIST  Final   Special Requests   Final    BOTTLES DRAWN AEROBIC ONLY Blood Culture results may not be optimal due to an inadequate volume of blood received in culture bottles   Culture   Final    NO GROWTH 2 DAYS Performed at [facility], [place] [address] [place], [place] [address]    Report Status PENDING  Incomplete  MRSA Next Gen by PCR, Nasal     Status: Abnormal   Collection Time: 04/06/21 10:50 AM   Specimen: Nasal Mucosa; Nasal Swab  Result Value Ref Range Status   MRSA by PCR Next Gen DETECTED (A) NOT DETECTED Final    Comment: RESULT CALLED TO, READ BACK BY AND VERIFIED WITH: RN R.AGUSTIS ON [id number] AT 31 BY E.PARRISH (NOTE) The GeneXpert MRSA Assay (FDA approved for NASAL specimens only), is one component of a comprehensive MRSA colonization surveillance program. It is not intended to diagnose MRSA infection nor to guide or monitor treatment for MRSA infections. Test performance is not FDA approved in patients less than 64 years old. Performed at  [facility], [place] [address] [place], [place] [address]          Radiology Studies: CT Angio Chest Pulmonary Embolism (PE) W or WO Contrast  Result Date: 04/05/2021 CLINICAL DATA:  Pulmonary embolism (PE) suspected, high prob. Chest pain, cough EXAM: CT ANGIOGRAPHY CHEST WITH CONTRAST TECHNIQUE: Multidetector CT imaging of the chest was performed using the standard protocol during bolus administration of intravenous contrast. Multiplanar CT image reconstructions and MIPs were obtained to evaluate the vascular anatomy. CONTRAST:  61mL OMNIPAQUE IOHEXOL 350 MG/ML SOLN COMPARISON:  05/22/2017 FINDINGS: Cardiovascular: There is adequate opacification of the pulmonary arterial tree. No intraluminal filling defect identified to suggest acute pulmonary embolism. The central pulmonary arteries are enlarged in keeping with pulmonary arterial hypertension. Extensive multi-vessel coronary artery calcification. Moderate cardiomegaly, stable since prior examination. No pericardial effusion. Moderate atherosclerotic calcification within the thoracic aorta. No aortic aneurysm. Left internal jugular hemodialysis catheter tip noted within the superior right atrium. Mediastinum/Nodes: Visualized thyroid is unremarkable. No pathologic thoracic adenopathy. Stable soft tissue nodule within the anterior mediastinum measuring 18 x 23 mm this is nonspecific, but may represent a primary mass such as a thymoma or pathologically enlarged lymph node. The esophagus is unremarkable. Lungs/Pleura: There is dense consolidation of the posterior basal segment of the right lower lobe with extensive airway impaction in keeping with changes of acute lobar pneumonia or aspiration in the appropriate clinical setting. The lungs are otherwise clear. No pneumothorax or pleural effusion. Central airways are widely patent. Upper Abdomen: No acute abnormality. Musculoskeletal: No acute bone abnormality. Degenerative changes are seen within  the thoracic spine. Bilateral total shoulder arthroplasty is partially visualized. Review of the MIP images confirms the above findings. IMPRESSION: No pulmonary embolism. Dense consolidation of the posterior basal right lower lobe in keeping with acute lobar pneumonia or aspiration. No central obstructing lesion. Extensive coronary artery calcification. Stable moderate cardiomegaly. Stable 23 mm soft tissue nodule within the anterior mediastinum. Differential considerations are led by thymoma or pathologic lymph node. This is unchanged in size and configuration, however, since remote prior examination of 05/22/2017. Aortic Atherosclerosis (ICD10-I70.0). Electronically Signed   By: Fidela Salisbury M.D.   On: 04/05/2021 22:52   DG Chest Portable 1 View  Result Date: 04/05/2021 CLINICAL DATA:  Cough EXAM: PORTABLE CHEST 1 VIEW COMPARISON:  Chest radiograph dated Aug 14, 2017 and September 06, 2018 FINDINGS: The heart size and mediastinal contours are within normal limits. Aorta is tortuous with atherosclerotic calcifications. Left access dialysis catheter with distal tip at the cavoatrial junction, unchanged. Both lungs are clear. Bilateral shoulder arthroplasty. IMPRESSION: No acute cardiopulmonary process. Electronically Signed   By: Keane Police D.O.   On: 04/05/2021 09:28        Scheduled Meds:  vitamin C  500 mg Oral Daily   calcitRIOL  0.75 mcg Oral Q M,W,F-HD   cetirizine  5 mg Oral QPM   Chlorhexidine Gluconate Cloth  6 each Topical Q0600   cinacalcet  30 mg Oral Q M,W,F   citalopram  40 mg Oral Daily   docusate sodium  100 mg Oral Daily   ezetimibe  10 mg Oral Daily   famotidine  20 mg Oral Daily   ferric citrate  420 mg Oral TID WC   heparin  5,000 Units Subcutaneous Q8H   hydrALAZINE  10 mg Oral TID   levothyroxine  125 mcg Oral Daily   molnupiravir EUA  4 capsule Oral BID   mometasone-formoterol  2 puff Inhalation BID   multivitamin  1 tablet Oral Daily   mupirocin ointment   Nasal BID    pantoprazole  20 mg Oral Daily   pramipexole  0.125 mg Oral QHS   sodium chloride flush  3 mL Intravenous Q12H   zinc sulfate  220 mg Oral Daily   Continuous Infusions:  sodium chloride Stopped (04/06/21 0236)   sodium chloride     sodium chloride     ceFEPime (MAXIPIME) IV     vancomycin      Assessment & Plan:   Principal Problem:   Sepsis ([place]) Active Problems:   Acquired hypothyroidism   ESRD on hemodialysis ([place])   COPD (chronic obstructive pulmonary disease) ([place])   Anxiety   Do not resuscitate   COVID-19 virus infection   Infection of hemodialysis tunneled catheter ([place])   Elevated troponin   Sepsis secondary infection of ?hemodialysis catheter vs covid vs pna IR evaluated patient, daily did not find any indication for catheter removal/replacement at this time. Leukocytosis increased Blood cultures pending Procalcitonin was elevated Cta with pna v.s aspiration see full result Lactic acid was elevated, CRP elevated Treatment for COVID protocol as below 1/4 continue IV antibiotics  Follow-up blood cultures pending  If blood cultures positive will need to remove catheter     COVID-19 infection: Acute.  Continue covid protocal 1/4 continue monupiravir, vita c and zn CTA neg for PE.      Hypoglycemia: Acute.  On admission glucose was noted to be as low as 65.  She was able to be given with improvement in blood sugar to 85. -Hypoglycemic protocols 1/4 encourage patient to increase p.o. intake.  Spoke to daughter she placed her teeth back in and will try to bring her some renal diet type foods that she likes to eat here.        Right-sided chest pain: Acute.   CTA negative for PE     Atrial fibrillation: Patient was noted to be in atrial fibrillation with heart rates initially elevated into the 120s in route with EMS, but currently rate controlled.  Not on any anticoagulation due to prior history of bleeding. -Continue to monitor   ESRD on HD:  HD  Monday Wednesday Friday schedule, next HD tomorrow      Essential hypertension:  Stable, Monitor   Elevated troponin: Acute.  Will consult cardiology as cta with  coronary artery calcif. Ck echo   COPD, without acute exacerbation -Continue levocetirizine and pharmacy substitution for Symbicort   Hypothyroidism: TSH 7.11 on 02/23/2021. TSH normal -Continue levothyroxine   Depression and anxiety -Continue Celexa   Chronic pain -Continue home medication regimen   DVT prophylaxis: Heparin Code Status: DNR Family Communication: Updated daughter Disposition Plan:  Status is: Inpatient  Remains inpatient appropriate because: IV treatment            LOS: 2 days   Time spent 35 minutes with more than 50% on COC    Nolberto Hanlon, MD [facility] Pager [phone]  If 7PM-7AM, please contact night-coverage 04/07/2021, 9:08 AM

## 2021-04-07 NOTE — Progress Notes (Signed)
°  Transition of Care [facility]) Screening Note   Patient Details  Name: [name] Date of Birth: 12-16-1940   Transition of [facility]) CM/SW Contact:    Tom-Johnson, Renea Ee, RN Phone Number: 04/07/2021, 2:33 PM  Patient is from home with family. Admitted for Sepsis and found to be Covid positive. No recommendations noted. Transition of Care [facility]) has reviewed patient and no TOC needs have been identified at this time. We will continue to monitor patient advancement through interdisciplinary progression rounds. If new patient transition needs arise, please place a TOC consult.

## 2021-04-07 NOTE — Progress Notes (Signed)
°  Echocardiogram 2D Echocardiogram has been performed.  [name] F 04/07/2021, 3:32 PM

## 2021-04-08 DIAGNOSIS — E039 Hypothyroidism, unspecified: Secondary | ICD-10-CM | POA: Diagnosis not present

## 2021-04-08 DIAGNOSIS — A419 Sepsis, unspecified organism: Secondary | ICD-10-CM

## 2021-04-08 DIAGNOSIS — J449 Chronic obstructive pulmonary disease, unspecified: Secondary | ICD-10-CM | POA: Diagnosis not present

## 2021-04-08 DIAGNOSIS — F419 Anxiety disorder, unspecified: Secondary | ICD-10-CM | POA: Diagnosis not present

## 2021-04-08 LAB — CBC WITH DIFFERENTIAL/PLATELET
Abs Immature Granulocytes: 0.07 10*3/uL (ref 0.00–0.07)
Basophils Absolute: 0.1 10*3/uL (ref 0.0–0.1)
Basophils Relative: 1 %
Eosinophils Absolute: 0.3 10*3/uL (ref 0.0–0.5)
Eosinophils Relative: 2 %
HCT: 33.7 % — ABNORMAL LOW (ref 36.0–46.0)
Hemoglobin: 11 g/dL — ABNORMAL LOW (ref 12.0–15.0)
Immature Granulocytes: 1 %
Lymphocytes Relative: 9 %
Lymphs Abs: 1.1 10*3/uL (ref 0.7–4.0)
MCH: 31.1 pg (ref 26.0–34.0)
MCHC: 32.6 g/dL (ref 30.0–36.0)
MCV: 95.2 fL (ref 80.0–100.0)
Monocytes Absolute: 0.7 10*3/uL (ref 0.1–1.0)
Monocytes Relative: 5 %
Neutro Abs: 10.8 10*3/uL — ABNORMAL HIGH (ref 1.7–7.7)
Neutrophils Relative %: 82 %
Platelets: 253 10*3/uL (ref 150–400)
RBC: 3.54 MIL/uL — ABNORMAL LOW (ref 3.87–5.11)
RDW: 15.6 % — ABNORMAL HIGH (ref 11.5–15.5)
WBC: 13.1 10*3/uL — ABNORMAL HIGH (ref 4.0–10.5)
nRBC: 0 % (ref 0.0–0.2)

## 2021-04-08 LAB — COMPREHENSIVE METABOLIC PANEL
ALT: 17 U/L (ref 0–44)
AST: 24 U/L (ref 15–41)
Albumin: 2.3 g/dL — ABNORMAL LOW (ref 3.5–5.0)
Alkaline Phosphatase: 79 U/L (ref 38–126)
Anion gap: 10 (ref 5–15)
BUN: 29 mg/dL — ABNORMAL HIGH (ref 8–23)
CO2: 22 mmol/L (ref 22–32)
Calcium: 7.4 mg/dL — ABNORMAL LOW (ref 8.9–10.3)
Chloride: 101 mmol/L (ref 98–111)
Creatinine, Ser: 4.84 mg/dL — ABNORMAL HIGH (ref 0.44–1.00)
GFR, Estimated: 9 mL/min — ABNORMAL LOW (ref >60)
Glucose, Bld: 77 mg/dL (ref 70–99)
Potassium: 3.4 mmol/L — ABNORMAL LOW (ref 3.5–5.1)
Sodium: 133 mmol/L — ABNORMAL LOW (ref 135–145)
Total Bilirubin: 0.6 mg/dL (ref 0.3–1.2)
Total Protein: 5.5 g/dL — ABNORMAL LOW (ref 6.5–8.1)

## 2021-04-08 LAB — GLUCOSE, CAPILLARY
Glucose-Capillary: 104 mg/dL — ABNORMAL HIGH (ref 70–99)
Glucose-Capillary: 76 mg/dL (ref 70–99)
Glucose-Capillary: 77 mg/dL (ref 70–99)
Glucose-Capillary: 86 mg/dL (ref 70–99)
Glucose-Capillary: 91 mg/dL (ref 70–99)
Glucose-Capillary: 94 mg/dL (ref 70–99)

## 2021-04-08 LAB — HEPATITIS B DNA, ULTRAQUANTITATIVE, PCR
HBV DNA SERPL PCR-ACNC: NOT DETECTED IU/mL
HBV DNA SERPL PCR-LOG IU: UNDETERMINED log10 IU/mL

## 2021-04-08 LAB — HEPATITIS B SURFACE ANTIBODY, QUANTITATIVE: Hep B S AB Quant (Post): <3.1 m[IU]/mL — ABNORMAL LOW (ref >9.9)

## 2021-04-08 LAB — MAGNESIUM: Magnesium: 1.9 mg/dL (ref 1.7–2.4)

## 2021-04-08 LAB — PHOSPHORUS: Phosphorus: 2.5 mg/dL (ref 2.5–4.6)

## 2021-04-08 MED ORDER — CHLORHEXIDINE GLUCONATE CLOTH 2 % EX PADS
6.0000 | MEDICATED_PAD | Freq: Every day | CUTANEOUS | Status: DC
  Administered 2021-04-09: 6 via TOPICAL

## 2021-04-08 NOTE — Progress Notes (Addendum)
[facility] Progress Note  Name: [name] MRN: [id number] DOB: 09-Oct-1940  Subjective:  last HD on 1/4 with 1.8 kg UF.  She feels ok this am.  Dialysis went alright yesterday.  Spoke with primary team at bedside and she is calling ID.   Review of systems:  Denies shortness of breath or chest pain  Denies n/v Eating lunch  ------------------  Background on consult:  [name] is a 81 y.o. female with a history of ESRD on HD, COPD, GERD, HTN, and p afib who had HD today and was sent to the ER from the HD unit.  She had 1 hour and 11 minutes of HD when she began shaking and vomited per charting.  Treatment was ended prematurely.  She was found to be covid positive.  Nephrology is consulted for assistance with management of HD.   Note per charting EMS reported Afib with rate of 80's to 120's during transport here.  There is concern about her catheter site as well - this has been red and her family is concerned about an infection.  CXR with no acute cardiopulmonary process.  She left at 53 kg today from HD and left at 51.5 kg her last treatment.  Spoke with her daughter in law at bedside who pt states is also her power of attorney.  She states that her breathing is doing ok.  Nausea and vomiting earlier today.  She does ok with her fluids.   Intake/Output Summary (Last 24 hours) at 04/08/2021 1140 Last data filed at 04/08/2021 0900 Gross per 24 hour  Intake 620 ml  Output 1800 ml  Net -1180 ml    Vitals:  Vitals:   04/07/21 1425 04/07/21 1558 04/07/21 2200 04/08/21 0910  BP: 104/64 (!) 143/83 (!) 147/80 (!) 157/97  Pulse: 82 72 83 (!) 59  Resp: 18 18 18 18   Temp: 98.7 F (37.1 C) 98 F (36.7 C) 99 F (37.2 C) 98 F (36.7 C)  TempSrc:   Oral   SpO2: 96% 95% 98% 98%  Weight:      Height:         Physical Exam:  General: elderly female in bed in NAD HEENT: NCAT  Neck: supple trachea midline  Cards: S1S2 no rub Lungs: clear and reduced; unlabored lying in bed  with HOB slightly elevated. on room air  Abdomen:  soft/NT/ND Extremities: no edema  Psych normal mood and affect Neuro alert and oriented x 3 provides hx and follows commands Access Left IJ tunn catheter; catheter exit site with large scab and dried skin; no erythema  Medications reviewed   Labs:  BMP Latest Ref Rng & Units 04/08/2021 04/07/2021 04/06/2021  Glucose 70 - 99 mg/dL 77 67(L) 90  BUN 8 - 23 mg/dL 29(H) 64(H) 48(H)  Creatinine 0.44 - 1.00 mg/dL 4.84(H) 7.46(H) 6.03(H)  BUN/Creat Ratio 12 - 28 - - -  Sodium 135 - 145 mmol/L 133(L) 137 135  Potassium 3.5 - 5.1 mmol/L 3.4(L) 4.5 4.9  Chloride 98 - 111 mmol/L 101 103 102  CO2 22 - 32 mmol/L 22 23 21(L)  Calcium 8.9 - 10.3 mg/dL 7.4(L) 8.0(L) 7.7(L)   Outpatient HD orders:  [facility] 3[facility]  MWF BF 400/ DF 500 EDW 52 kg 2 k/2.5 Ca Tunn catheter Calcitriol 0.75 mcg three times a week Last Rec'd mircera 50 mcg on 12/21 and this is no longer ordered  Assessment/Plan:   # ESRD  - HD per MWF schedule    #  Covid positive - therapies per primary team    # paroxysmal a fib - per primary team    # Anemia CKD  - aranesp 40 mcg weekly on wed.  May be able to hold next dose.    # Concern for catheter exit site infection  - note on vanc and aztreonam per primary team. From my standpoint would continue the vanc (can give for a total of 10 days and with HD after discharge).  Cefepime is per primary team discretion - blood cultures pending - NGTD  - IR was consulted earlier for catheter removal and they felt exit site looked good and they deferred catheter removal  - she has been improving on broad spectrum abx  - primary team is calling ID  - NPO after midnight for anticipated tunneled catheter removal and exchange at a new site    # metabolic bone disease  - continue calcitriol.  on sensipar at home per charting; was reordered here. Phos acceptable and a bit low.  Discontinue auryxia for now  Disposition - continue  inpatient monitoring   Claudia Desanctis, MD 04/08/2021 12:02 PM   Appreciate ID.  Will request tunneled catheter removal with IR  NPO after midnight   Claudia Desanctis, MD 12:26 PM 04/08/2021

## 2021-04-08 NOTE — Consult Note (Signed)
[name]    Date of Admission:  04/05/2021   Total days of inpatient antibiotics         Reason for Consult: HD catheter infection    Principal Problem:   Sepsis ([place]) Active Problems:   Acquired hypothyroidism   ESRD on hemodialysis ([place])   COPD (chronic obstructive pulmonary disease) ([place])   Anxiety   Do not resuscitate   COVID-19 virus infection   Infection of hemodialysis tunneled catheter ([place])   Elevated troponin   Assessment: 81 year old female with ESRD on hemodialysis via left IJ permacath, COPD, history of MRSA bacteremia in 2019 status right HD catheter removal admitted for sepsis secondary to suspected HD catheter infection. Radiology was consulted and catheter remains in place as there was no indication for catheter removal/replacement at that time.  Infectious disease consulted for antibiotic management.  #HD catheter site infection #Hx of MRSA PNA and bacteremia SP HD catheter removal #COVID infection -Catheter insertion site is tender and erythematous. Although blood Cx are negative would replace catheter. She has blood Cx+ MRSA in 2019 and had her right chest HD catheter removed during that hospitalization. She reports having the current HD catheter since 2019.   Recommendations:  -D/C cefepime -Remove HD catheter -Follow-up TTE -Continue vancomycin, plan on 2 weeks of antibiotics from catheter removal. Would target gram positive as pt grown MRSA in the past  Microbiology:   Antibiotics: Vancomycin 1/2-p Cefepime 1/3-p Aztreonam 1/2-3 Molnupiravir 1/2-p  Cultures: Blood 1/2-NGTD   HPI: [name] is a 81 y.o. female ESRD on HD, COPD paroxysmal A. fib, hypothyroidism, anxiety and depression, history of MRSA bacteremia and pneumonia in 2019 SP right IJ removal admitted for sepsis secondary to dialysis catheter infection.    On arrival, patient had a temp of 101.5, WBC15.9K.  She was initially started on  broad-spectrum antibiotics with vancomycin aztreonam she was transitioned to vancomycin and cefepime. Erythema noted at HD catheter insertion site and  nephrology was consulted who recommended  IR engagement.  Patient was seen by radiology and felt that there was no indication for catheter removal/replacement at that time.   Today, pt reports that for the last few months catheter site has been tender on palpation. Although fevers started at dialysis session.    Review of Systems: Review of Systems  All other systems reviewed and are negative.  Past Medical History:  Diagnosis Date   Acute respiratory failure ([place]) 05/2017   Anemia    Anxiety    Arthritis    COPD (chronic obstructive pulmonary disease) ([facility])    Depression    ESRD (end stage renal disease) ([facility])    dialysis MWF   GERD (gastroesophageal reflux disease)    Gout    History of blood transfusion    History of diabetes mellitus    "diet controlled"   HLD (hyperlipidemia)    HOH (hard of hearing)    left ear   Hypertension    hypotensive -since starting dialysis   Hypothyroidism    MRSA (methicillin resistant staph aureus) culture positive 06/01/2017   PAF (paroxysmal atrial fibrillation) ([place])    a. Echo 11/16:  Mild LVH, EF 55-60%, normal wall motion, MAC, mild MR, severe LAE (49 ml/m2), mild RVE, normal RVSF, mild RAE, mild TR, PASP 24 mmHg;  CHADS2-VASc: 4 >> Coumadin followed by PCP    Social History   Tobacco Use   Smoking status: Former    Packs/day:  0.25    Years: 2.00    Pack years: 0.50    Types: Cigarettes    Quit date: 04/04/1998    Years since quitting: 23.0   Smokeless tobacco: Never  Vaping Use   Vaping Use: Never used  Substance Use Topics   Alcohol use: No    Alcohol/week: 0.0 standard drinks   Drug use: No    Family History  Problem Relation Age of Onset   Arthritis Mother    Diabetes Father        before age 67   Heart disease Father    Diabetes Sister    Cancer Brother     Hyperlipidemia Daughter    Hypertension Daughter    Diabetes Daughter    Hypertension Son    Hyperlipidemia Son    Dementia Sister    Cancer Sister        Unknown   Other Sister        Died in car accident   Stroke Brother    Diabetes Daughter    Hypertension Daughter    Hyperlipidemia Daughter    Hepatitis C Son    Scheduled Meds:  vitamin C  500 mg Oral Daily   cetirizine  5 mg Oral QPM   Chlorhexidine Gluconate Cloth  6 each Topical Q0600   cinacalcet  30 mg Oral Q M,W,F   citalopram  40 mg Oral Daily   darbepoetin (ARANESP) injection - DIALYSIS  40 mcg Intravenous Q Wed-HD   docusate sodium  100 mg Oral Daily   ezetimibe  10 mg Oral Daily   famotidine  20 mg Oral Daily   heparin  5,000 Units Subcutaneous Q8H   hydrALAZINE  10 mg Oral TID   levothyroxine  125 mcg Oral Daily   molnupiravir EUA  4 capsule Oral BID   mometasone-formoterol  2 puff Inhalation BID   multivitamin  1 tablet Oral Daily   mupirocin ointment   Nasal BID   pantoprazole  20 mg Oral Daily   pramipexole  0.125 mg Oral QHS   sodium chloride flush  3 mL Intravenous Q12H   zinc sulfate  220 mg Oral Daily   Continuous Infusions:  sodium chloride Stopped (04/06/21 0236)   vancomycin Stopped (04/07/21 1432)   PRN Meds:.sodium chloride, acetaminophen, chlorpheniramine-HYDROcodone, dextrose, guaiFENesin-dextromethorphan, HYDROcodone-acetaminophen, Ipratropium-Albuterol, nystatin cream Allergies  Allergen Reactions   Amoxicillin Rash    Has patient had a PCN reaction causing immediate rash, facial/tongue/throat swelling, SOB or lightheadedness with hypotension:YES Has patient had a PCN reaction causing severe rash involving mucus membranes or skin necrosis: Yes Has patient had a PCN reaction that required hospitalization No Has patient had a PCN reaction occurring within the last 10 years: Yes If all of the above answers are "NO", then may proceed with Cephalosporin use. Cephalosporins ok   Elemental  Sulfur Hives   Sulfur Hives   Peg 3350-Electrolytes Other (See Comments)   Sulfa Drugs Cross Reactors Hives and Itching   Miralax [Polyethylene Glycol] Itching   Mixed Grasses    Percocet [Oxycodone-Acetaminophen] Itching and Rash    Did not happen last time she took it   Sulfa Antibiotics Hives    OBJECTIVE: Blood pressure (!) 157/97, pulse (!) 59, temperature 98 F (36.7 C), resp. rate 18, height _0  (1.651 m), weight 54 kg, SpO2 98 %.  Physical Exam Constitutional:      Appearance: Normal appearance.  HENT:     Head: Normocephalic and atraumatic.     Right  Ear: Tympanic membrane normal.     Left Ear: Tympanic membrane normal.     Nose: Nose normal.     Mouth/Throat:     Mouth: Mucous membranes are moist.  Eyes:     Extraocular Movements: Extraocular movements intact.     Conjunctiva/sclera: Conjunctivae normal.     Pupils: Pupils are equal, round, and reactive to light.  Cardiovascular:     Rate and Rhythm: Normal rate and regular rhythm.     Heart sounds: No murmur heard.   No friction rub. No gallop.  Pulmonary:     Effort: Pulmonary effort is normal.     Breath sounds: Normal breath sounds.  Abdominal:     General: Abdomen is flat.     Palpations: Abdomen is soft.  Musculoskeletal:        General: Normal range of motion.  Skin:    General: Skin is warm.     Comments: Left IJ site is tender with surround scabbing around insertion site.   Neurological:     General: No focal deficit present.     Mental Status: She is alert and oriented to person, place, and time.  Psychiatric:        Mood and Affect: Mood normal.    Lab Results Lab Results  Component Value Date   WBC 13.1 (H) 04/08/2021   HGB 11.0 (L) 04/08/2021   HCT 33.7 (L) 04/08/2021   MCV 95.2 04/08/2021   PLT 253 04/08/2021    Lab Results  Component Value Date   CREATININE 4.84 (H) 04/08/2021   BUN 29 (H) 04/08/2021   NA 133 (L) 04/08/2021   K 3.4 (L) 04/08/2021   CL 101 04/08/2021   CO2  22 04/08/2021    Lab Results  Component Value Date   ALT 17 04/08/2021   AST 24 04/08/2021   ALKPHOS 79 04/08/2021   BILITOT 0.6 04/08/2021       [name], [facility] 04/08/2021, 3:20 PM

## 2021-04-08 NOTE — Consult Note (Signed)
° °  [facility] CM Inpatient Consult   04/08/2021  [name] [birth year] [id number]  [facility] [ACO] Patient: [name] HMO  Primary Care Provider:  [name], FNP, [place],  is an embedded provider with a Chronic Care Management team and program, and is listed for the transition of care follow up and appointments.  Patient was screened for Embedded practice service needs for chronic care management for post hospital needs due to high risk scores for unplanned readmission risk. Patient has ongoing HD needs noted.  Plan: Notification to be sent to the Embedded Chronic Care Management team to make aware of any  TOC needs if post hospital if appropriate.  Continue to follow for progress with inpatient [facility] team.  Please contact for further questions,  Natividad Brood, RN BSN [facility] Liaison  [phone] business mobile phone Toll free office [phone]  Fax number: [phone] [email or web address]

## 2021-04-08 NOTE — Progress Notes (Signed)
PROGRESS NOTE    [name]  ZOX:[id number] DOB: July 13, 1940 DOA: 04/05/2021 PCP: [name], FNP    Brief Narrative:  [name] is a 81 y.o. female with medical history significant of ESRD on HD, COPD, paroxysmal atrial fibrillation on aspirin, anemia of chronic disease, hypothyroidism, anxiety, and depression who presents after having severe chills with shakes while at dialysis.  She had complained of some chills this morning, but chronically cold natured so she thought nothing of it.  Patient has had a nonproductive cough that has gotten deeper over the last 2 days and complained of some right-sided chest pain especially with taking a deep breath. She had received an hour of hemodialysis prior to onset of chills and shakes.  1/4 had an extensive conversation with daughter updated about plans and her current status all questions were answered 1/5 has no complaints. Had HD yesterday  Consultants:  IR, nephrology  Procedures:   Antimicrobials:  Cefepime, vanco   Subjective: Denies shortness of breath, chest pain, or diarrhea  Objective: Vitals:   04/07/21 1425 04/07/21 1558 04/07/21 2200 04/08/21 0910  BP: 104/64 (!) 143/83 (!) 147/80 (!) 157/97  Pulse: 82 72 83 (!) 59  Resp: 18 18 18 18   Temp: 98.7 F (37.1 C) 98 F (36.7 C) 99 F (37.2 C) 98 F (36.7 C)  TempSrc:   Oral   SpO2: 96% 95% 98% 98%  Weight:      Height:        Intake/Output Summary (Last 24 hours) at 04/08/2021 1214 Last data filed at 04/08/2021 0900 Gross per 24 hour  Intake 620 ml  Output 1800 ml  Net -1180 ml   Filed Weights   04/05/21 0852 04/07/21 0926 04/07/21 1243  Weight: 54.4 kg 56 kg 54 kg    Examination: Calm, eating lunch Decreased breath sounds no wheezing Regular S1-S2 no gallops Soft benign Positive bowel sounds No edema Left upper chest HD catheter does not appear to have any fluctuance    Data Reviewed: I have personally reviewed following labs and imaging  studies  CBC: Recent Labs  Lab 04/05/21 0918 04/06/21 0202 04/07/21 0550 04/08/21 0306  WBC 15.9* 24.0* 17.3* 13.1*  NEUTROABS 14.2* 21.8* 14.9* 10.8*  HGB 11.2* 10.7* 10.1* 11.0*  HCT 35.3* 33.2* 32.1* 33.7*  MCV 98.3 96.2 96.4 95.2  PLT 218 205 237 811   Basic Metabolic Panel: Recent Labs  Lab 04/05/21 0918 04/06/21 0202 04/07/21 0550 04/08/21 0306  NA 139 135 137 133*  K 4.1 4.9 4.5 3.4*  CL 102 102 103 101  CO2 25 21* 23 22  GLUCOSE 65* 90 67* 77  BUN 32* 48* 64* 29*  CREATININE 5.35* 6.03* 7.46* 4.84*  CALCIUM 8.3* 7.7* 8.0* 7.4*  MG  --  2.0 2.2 1.9  PHOS  --  3.3 3.5 2.5   GFR: Estimated Creatinine Clearance: 7.9 mL/min (A) (by C-G formula based on SCr of 4.84 mg/dL (H)). Liver Function Tests: Recent Labs  Lab 04/05/21 0918 04/06/21 0202 04/07/21 0550 04/08/21 0306  AST 34 34 21 24  ALT 20 18 18 17   ALKPHOS 61 69 84 79  BILITOT 1.0 0.8 0.7 0.6  PROT 5.7* 5.2* 5.3* 5.5*  ALBUMIN 2.8* 2.2* 2.2* 2.3*   No results for input(s): LIPASE, AMYLASE in the last 168 hours. No results for input(s): AMMONIA in the last 168 hours. Coagulation Profile: No results for input(s): INR, PROTIME in the last 168 hours. Cardiac Enzymes: No results for  input(s): CKTOTAL, CKMB, CKMBINDEX, TROPONINI in the last 168 hours. BNP (last 3 results) No results for input(s): PROBNP in the last 8760 hours. HbA1C: No results for input(s): HGBA1C in the last 72 hours. CBG: Recent Labs  Lab 04/07/21 2121 04/08/21 0035 04/08/21 0412 04/08/21 0743 04/08/21 1139  GLUCAP 112* 94 91 77 86   Lipid Profile: Recent Labs    04/05/21 1350  TRIG 41   Thyroid Function Tests: Recent Labs    04/05/21 1808  TSH 2.703   Anemia Panel: Recent Labs    04/05/21 1350  FERRITIN 1,374*   Sepsis Labs: Recent Labs  Lab 04/05/21 1020 04/05/21 1350  PROCALCITON  --  8.21  LATICACIDVEN 2.1* 2.1*    Recent Results (from the past 240 hour(s))  Culture, blood (routine x 2)      Status: None (Preliminary result)   Collection Time: 04/05/21  9:18 AM   Specimen: Right Antecubital; Blood  Result Value Ref Range Status   Specimen Description RIGHT ANTECUBITAL  Final   Special Requests   Final    BOTTLES DRAWN AEROBIC AND ANAEROBIC Blood Culture results may not be optimal due to an inadequate volume of blood received in culture bottles   Culture   Final    NO GROWTH 3 DAYS Performed at [facility], [place] [address] [place], [place] [address]    Report Status PENDING  Incomplete  Resp Panel by RT-PCR (Flu A&B, Covid) Nasopharyngeal Swab     Status: Abnormal   Collection Time: 04/05/21  9:18 AM   Specimen: Nasopharyngeal Swab; Nasopharyngeal(NP) swabs in vial transport medium  Result Value Ref Range Status   SARS Coronavirus 2 by RT PCR POSITIVE (A) NEGATIVE Final    Comment: (NOTE) SARS-CoV-2 target nucleic acids are DETECTED.  The SARS-CoV-2 RNA is generally detectable in upper respiratory specimens during the acute phase of infection. Positive results are indicative of the presence of the identified virus, but do not rule out bacterial infection or co-infection with other pathogens not detected by the test. Clinical correlation with patient history and other diagnostic information is necessary to determine patient infection status. The expected result is Negative.  Fact Sheet for Patients: EntrepreneurPulse.com.au  Fact Sheet for Healthcare Providers: IncredibleEmployment.be  This test is not yet approved or cleared by the [place] FDA and  has been authorized for detection and/or diagnosis of SARS-CoV-2 by FDA under an Emergency Use Authorization (EUA).  This EUA will remain in effect (meaning this test can be used) for the duration of  the COVID-19 declaration under Section 564(b)(1) of the A ct, 21 U.S.C. section 360bbb-3(b)(1), unless the authorization is terminated or revoked sooner.     Influenza A by  PCR NEGATIVE NEGATIVE Final   Influenza B by PCR NEGATIVE NEGATIVE Final    Comment: (NOTE) The Xpert Xpress SARS-CoV-2/FLU/RSV plus assay is intended as an aid in the diagnosis of influenza from Nasopharyngeal swab specimens and should not be used as a sole basis for treatment. Nasal washings and aspirates are unacceptable for Xpert Xpress SARS-CoV-2/FLU/RSV testing.  Fact Sheet for Patients: EntrepreneurPulse.com.au  Fact Sheet for Healthcare Providers: IncredibleEmployment.be  This test is not yet approved or cleared by the [place] FDA and has been authorized for detection and/or diagnosis of SARS-CoV-2 by FDA under an Emergency Use Authorization (EUA). This EUA will remain in effect (meaning this test can be used) for the duration of the COVID-19 declaration under Section 564(b)(1) of the Act, 21 U.S.C. section 360bbb-3(b)(1),  unless the authorization is terminated or revoked.  Performed at [facility], [place] [address], [place], [place] [address]   Culture, blood (routine x 2)     Status: None (Preliminary result)   Collection Time: 04/05/21 10:22 AM   Specimen: BLOOD RIGHT WRIST  Result Value Ref Range Status   Specimen Description BLOOD RIGHT WRIST  Final   Special Requests   Final    BOTTLES DRAWN AEROBIC ONLY Blood Culture results may not be optimal due to an inadequate volume of blood received in culture bottles   Culture   Final    NO GROWTH 3 DAYS Performed at [facility], [place] [address] [place], [place] [address]    Report Status PENDING  Incomplete  MRSA Next Gen by PCR, Nasal     Status: Abnormal   Collection Time: 04/06/21 10:50 AM   Specimen: Nasal Mucosa; Nasal Swab  Result Value Ref Range Status   MRSA by PCR Next Gen DETECTED (A) NOT DETECTED Final    Comment: RESULT CALLED TO, READ BACK BY AND VERIFIED WITH: RN R.AGUSTIS ON [id number] AT 59 BY E.PARRISH (NOTE) The GeneXpert MRSA Assay (FDA  approved for NASAL specimens only), is one component of a comprehensive MRSA colonization surveillance program. It is not intended to diagnose MRSA infection nor to guide or monitor treatment for MRSA infections. Test performance is not FDA approved in patients less than 15 years old. Performed at [facility], [place] [address], [place],  [address]          Radiology Studies: ECHOCARDIOGRAM COMPLETE  Result Date: 04/07/2021    ECHOCARDIOGRAM REPORT   Patient Name:   MISSY BAKSH Date of Exam: 04/07/2021 Medical Rec #:  [id number]   Height:       65.0 in Accession #:    [id number]  Weight:       119.0 lb Date of Birth:  [birth year]   BSA:          1.587 m Patient Age:    65 years    BP:           104/64 mmHg Patient Gender: F           HR:           72 bpm. Exam Location:  Inpatient Procedure: 2D Echo, Cardiac Doppler and Color Doppler Indications:    Elevated Troponin  History:        Patient has prior history of Echocardiogram examinations, most                 recent 05/01/2018. Risk Factors:Family History of Coronary Artery                 Disease, Hypertension, Diabetes and Dyslipidemia. Covid 19                 positive.  Sonographer:    Merrie Roof RDCS Referring Phys: [id number] [place]  1. Left ventricular ejection fraction, by estimation, is 50 to 55%. The left ventricle has low normal function. The left ventricle has no regional wall motion abnormalities. Left ventricular diastolic function could not be evaluated.  2. Right ventricular systolic function is normal. The right ventricular size is normal.  3. Left atrial size was severely dilated.  4. Right atrial size was moderately dilated.  5. The mitral valve is normal in structure. Trivial mitral valve regurgitation. No evidence of mitral stenosis.  6. The aortic valve is tricuspid.  Aortic valve regurgitation is not visualized. Aortic valve sclerosis is present, with no evidence of aortic valve stenosis.  7. Aortic  dilatation noted. There is borderline dilatation of the ascending aorta, measuring 39 mm.  8. The inferior vena cava is normal in size with greater than 50% respiratory variability, suggesting right atrial pressure of 3 mmHg. FINDINGS  Left Ventricle: Left ventricular ejection fraction, by estimation, is 50 to 55%. The left ventricle has low normal function. The left ventricle has no regional wall motion abnormalities. The left ventricular internal cavity size was normal in size. There is no left ventricular hypertrophy. Left ventricular diastolic function could not be evaluated due to atrial fibrillation. Left ventricular diastolic function could not be evaluated. Right Ventricle: The right ventricular size is normal. Right ventricular systolic function is normal. Left Atrium: Left atrial size was severely dilated. Right Atrium: Right atrial size was moderately dilated. Pericardium: There is no evidence of pericardial effusion. Mitral Valve: The mitral valve is normal in structure. Mild mitral annular calcification. Trivial mitral valve regurgitation. No evidence of mitral valve stenosis. Tricuspid Valve: The tricuspid valve is normal in structure. Tricuspid valve regurgitation is mild . No evidence of tricuspid stenosis. Aortic Valve: The aortic valve is tricuspid. Aortic valve regurgitation is not visualized. Aortic valve sclerosis is present, with no evidence of aortic valve stenosis. Pulmonic Valve: The pulmonic valve was normal in structure. Pulmonic valve regurgitation is not visualized. No evidence of pulmonic stenosis. Aorta: Aortic dilatation noted. There is borderline dilatation of the ascending aorta, measuring 39 mm. Venous: The inferior vena cava is normal in size with greater than 50% respiratory variability, suggesting right atrial pressure of 3 mmHg. IAS/Shunts: No atrial level shunt detected by color flow Doppler.  LEFT ATRIUM              Index        RIGHT ATRIUM           Index LA Vol (A2C):    120.0 ml 75.63 ml/m  RA Area:     21.60 cm LA Vol (A4C):   90.9 ml  57.29 ml/m  RA Volume:   64.70 ml  40.78 ml/m LA Biplane Vol: 109.0 ml 68.70 ml/m  TRICUSPID VALVE TR Peak grad:   26.4 mmHg TR Vmax:        257.00 cm/s Kirk Ruths MD Electronically signed by Kirk Ruths MD Signature Date/Time: 04/07/2021/4:04:20 PM    Final         Scheduled Meds:  vitamin C  500 mg Oral Daily   cetirizine  5 mg Oral QPM   Chlorhexidine Gluconate Cloth  6 each Topical Q0600   cinacalcet  30 mg Oral Q M,W,F   citalopram  40 mg Oral Daily   darbepoetin (ARANESP) injection - DIALYSIS  40 mcg Intravenous Q Wed-HD   docusate sodium  100 mg Oral Daily   ezetimibe  10 mg Oral Daily   famotidine  20 mg Oral Daily   heparin  5,000 Units Subcutaneous Q8H   hydrALAZINE  10 mg Oral TID   levothyroxine  125 mcg Oral Daily   molnupiravir EUA  4 capsule Oral BID   mometasone-formoterol  2 puff Inhalation BID   multivitamin  1 tablet Oral Daily   mupirocin ointment   Nasal BID   pantoprazole  20 mg Oral Daily   pramipexole  0.125 mg Oral QHS   sodium chloride flush  3 mL Intravenous Q12H   zinc sulfate  220 mg Oral  Daily   Continuous Infusions:  sodium chloride Stopped (04/06/21 0236)   ceFEPime (MAXIPIME) IV 2 g (04/07/21 1811)   vancomycin Stopped (04/07/21 1432)    Assessment & Plan:   Principal Problem:   Sepsis ([place]) Active Problems:   Acquired hypothyroidism   ESRD on hemodialysis ([facility])   COPD (chronic obstructive pulmonary disease) ([place])   Anxiety   Do not resuscitate   COVID-19 virus infection   Infection of hemodialysis tunneled catheter ([place])   Elevated troponin   Sepsis secondary infection of ?hemodialysis catheter vs covid vs pna IR evaluated patient, daily did not find any indication for catheter removal/replacement at this time. Procalcitonin was elevated Cta with pna v.s aspiration see full result Treatment for COVID protocol as below 1/5 lactic acid, WBC improving If  blood cultures become positive we will remove catheter however so far negative On room air with O2 sats in the 90s Will continue current IV antibiotics The question is how long do we treat if there is a question about HD catheter being infected.  We will consult ID for further input Discussed this with Dr. Royce Macadamia nephrology who agrees with plan     COVID-19 infection: Acute.  Continue covid protocal 1/5 continue monupiravir, vit c and zn CTA neg for PE.       Hypoglycemia: Acute.  On admission glucose was noted to be as low as 65.  She was able to be given with improvement in blood sugar to 85. -Hypoglycemic protocols 1/5 bg now stable Daughter states pt drinks regular drinks at home, not sugar free. Ask nsg to give her reg drinks  keep sugar stable.        Right-sided chest pain: Acute.   CTA negative for PE       Atrial fibrillation: Patient was noted to be in atrial fibrillation with heart rates initially elevated into the 120s in route with EMS, but currently rate controlled.  Not on any anticoagulation due to prior history of bleeding. -Continue to monitor   ESRD on HD:  HD Monday Wednesday Friday schedule       Essential hypertension:  Stable, Monitor   Elevated troponin: Acute.  Will consult cardiology as cta with  coronary artery calcif. Ck echo   COPD, without acute exacerbation -Continue levocetirizine and pharmacy substitution for Symbicort   Hypothyroidism: TSH 7.11 on 02/23/2021. TSH normal -Continue levothyroxine   Depression and anxiety -Continue Celexa   Chronic pain -Continue home medication regimen   DVT prophylaxis: Heparin Code Status: DNR Family Communication: none at bedside Disposition Plan:  Status is: Inpatient  Remains inpatient appropriate because: IV treatment            LOS: 3 days   Time spent 35 minutes with more than 50% on COC    Nolberto Hanlon, MD [facility] Pager [phone]  If  7PM-7AM, please contact night-coverage 04/08/2021, 12:14 PM

## 2021-04-09 DIAGNOSIS — R778 Other specified abnormalities of plasma proteins: Secondary | ICD-10-CM

## 2021-04-09 DIAGNOSIS — E039 Hypothyroidism, unspecified: Secondary | ICD-10-CM

## 2021-04-09 DIAGNOSIS — Z992 Dependence on renal dialysis: Secondary | ICD-10-CM

## 2021-04-09 DIAGNOSIS — Z66 Do not resuscitate: Secondary | ICD-10-CM

## 2021-04-09 DIAGNOSIS — N186 End stage renal disease: Secondary | ICD-10-CM

## 2021-04-09 DIAGNOSIS — F419 Anxiety disorder, unspecified: Secondary | ICD-10-CM

## 2021-04-09 DIAGNOSIS — J449 Chronic obstructive pulmonary disease, unspecified: Secondary | ICD-10-CM

## 2021-04-09 LAB — COMPREHENSIVE METABOLIC PANEL
ALT: 19 U/L (ref 0–44)
AST: 20 U/L (ref 15–41)
Albumin: 2.2 g/dL — ABNORMAL LOW (ref 3.5–5.0)
Alkaline Phosphatase: 70 U/L (ref 38–126)
Anion gap: 12 (ref 5–15)
BUN: 40 mg/dL — ABNORMAL HIGH (ref 8–23)
CO2: 25 mmol/L (ref 22–32)
Calcium: 8 mg/dL — ABNORMAL LOW (ref 8.9–10.3)
Chloride: 99 mmol/L (ref 98–111)
Creatinine, Ser: 6.32 mg/dL — ABNORMAL HIGH (ref 0.44–1.00)
GFR, Estimated: 6 mL/min — ABNORMAL LOW (ref >60)
Glucose, Bld: 81 mg/dL (ref 70–99)
Potassium: 3.5 mmol/L (ref 3.5–5.1)
Sodium: 136 mmol/L (ref 135–145)
Total Bilirubin: 0.5 mg/dL (ref 0.3–1.2)
Total Protein: 5.3 g/dL — ABNORMAL LOW (ref 6.5–8.1)

## 2021-04-09 LAB — GLUCOSE, CAPILLARY
Glucose-Capillary: 114 mg/dL — ABNORMAL HIGH (ref 70–99)
Glucose-Capillary: 74 mg/dL (ref 70–99)
Glucose-Capillary: 78 mg/dL (ref 70–99)
Glucose-Capillary: 81 mg/dL (ref 70–99)

## 2021-04-09 LAB — MAGNESIUM: Magnesium: 2.1 mg/dL (ref 1.7–2.4)

## 2021-04-09 LAB — CBC WITH DIFFERENTIAL/PLATELET
Abs Immature Granulocytes: 0.1 10*3/uL — ABNORMAL HIGH (ref 0.00–0.07)
Basophils Absolute: 0.1 10*3/uL (ref 0.0–0.1)
Basophils Relative: 1 %
Eosinophils Absolute: 0.3 10*3/uL (ref 0.0–0.5)
Eosinophils Relative: 3 %
HCT: 33.7 % — ABNORMAL LOW (ref 36.0–46.0)
Hemoglobin: 11 g/dL — ABNORMAL LOW (ref 12.0–15.0)
Immature Granulocytes: 1 %
Lymphocytes Relative: 8 %
Lymphs Abs: 0.9 10*3/uL (ref 0.7–4.0)
MCH: 31 pg (ref 26.0–34.0)
MCHC: 32.6 g/dL (ref 30.0–36.0)
MCV: 94.9 fL (ref 80.0–100.0)
Monocytes Absolute: 0.8 10*3/uL (ref 0.1–1.0)
Monocytes Relative: 7 %
Neutro Abs: 8.8 10*3/uL — ABNORMAL HIGH (ref 1.7–7.7)
Neutrophils Relative %: 80 %
Platelets: 269 10*3/uL (ref 150–400)
RBC: 3.55 MIL/uL — ABNORMAL LOW (ref 3.87–5.11)
RDW: 15.3 % (ref 11.5–15.5)
WBC: 11 10*3/uL — ABNORMAL HIGH (ref 4.0–10.5)
nRBC: 0 % (ref 0.0–0.2)

## 2021-04-09 LAB — PHOSPHORUS: Phosphorus: 3 mg/dL (ref 2.5–4.6)

## 2021-04-09 MED ORDER — HEPARIN SODIUM (PORCINE) 5000 UNIT/ML IJ SOLN
5000.0000 [IU] | Freq: Three times a day (TID) | INTRAMUSCULAR | Status: DC
  Administered 2021-04-09 – 2021-04-10 (×2): 5000 [IU] via SUBCUTANEOUS
  Filled 2021-04-09 (×3): qty 1

## 2021-04-09 NOTE — Progress Notes (Signed)
Pharmacy Antibiotic Note  [name] is a 81 y.o. 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 Pharmacy has been consulted for Aztreonam and Vancomycin dosing. Pt with ESRD (MWF HD).  She is on D5 of vanc for suspected HD cath infection. ID recommended to continue vanc and replace cath. Plan to treat for 14d post cath replacement. Abx have been streamlined to just vanc. She remains afebrile and wbc at 11. Blood cultures remain neg.   Plan:  Vancomycin 500 mg IV TTS Vanc levels prn   Height: 5\' 5"  (165.1 cm) Weight: 54 kg (119 lb 0.8 oz) IBW/kg (Calculated) : 57  Temp (24hrs), Avg:98.4 F (36.9 C), Min:97.9 F (36.6 C), Max:98.7 F (37.1 C)  Recent Labs  Lab 04/05/21 0918 04/05/21 1020 04/05/21 1350 04/06/21 0202 04/07/21 0550 04/08/21 0306 04/09/21 0235  WBC 15.9*  --   --  24.0* 17.3* 13.1* 11.0*  CREATININE 5.35*  --   --  6.03* 7.46* 4.84* 6.32*  LATICACIDVEN  --  2.1* 2.1*  --   --   --   --      Estimated Creatinine Clearance: 6.1 mL/min (A) (by C-G formula based on SCr of 6.32 mg/dL (H)).    Allergies  Allergen Reactions   Amoxicillin Rash    Has patient had a PCN reaction causing immediate rash, facial/tongue/throat swelling, SOB or lightheadedness with hypotension:YES Has patient had a PCN reaction causing severe rash involving mucus membranes or skin necrosis: Yes Has patient had a PCN reaction that required hospitalization No Has patient had a PCN reaction occurring within the last 10 years: Yes If all of the above answers are "NO", then may proceed with Cephalosporin use. Cephalosporins ok   Elemental Sulfur Hives   Sulfur Hives   Peg 3350-Electrolytes Other (See Comments)   Sulfa Drugs Cross Reactors Hives and Itching   Miralax [Polyethylene Glycol] Itching   Mixed Grasses    Percocet [Oxycodone-Acetaminophen] Itching and Rash    Did not happen last time she took it   Sulfa Antibiotics Hives    Antimicrobials  this admission: 1/2 Aztreonam >> 1/3 1/3 cefepime>>1/5 1/2 Vancomycin >>  1/2 molnupiravir x5d  Microbiology results: 1/2 BCx>ngtd COVID+ MRSA+  Onnie Boer, PharmD, [place], AAHIVP, CPP Infectious Disease Pharmacist 04/09/2021 9:16 AM

## 2021-04-09 NOTE — Progress Notes (Signed)
PROGRESS NOTE    [name]  HUT:[id number] DOB: [birth year] DOA: 04/05/2021 PCP: [name], FNP    Brief Narrative:  [name] is a 81 y.o. female with medical history significant of ESRD on HD, COPD, paroxysmal atrial fibrillation on aspirin, anemia of chronic disease, hypothyroidism, anxiety, and depression who presents after having severe chills with shakes while at dialysis.  She had complained of some chills this morning, but chronically cold natured so she thought nothing of it.  Patient has had a nonproductive cough that has gotten deeper over the last 2 days and complained of some right-sided chest pain especially with taking a deep breath. She had received an hour of hemodialysis prior to onset of chills and shakes.  1/4 had an extensive conversation with daughter updated about plans and her current status all questions were answered 1/5 has no complaints. Had HD yesterday 1/6 plan for removal of HD catheter today by IR   Consultants:  IR, nephrology  Procedures:   Antimicrobials:  Cefepime, vanco   Subjective: Denies shortness of breath, chest pain, or dizziness    Objective: Vitals:   04/08/21 0910 04/08/21 2050 04/09/21 0436 04/09/21 0824  BP: (!) 157/97 (!) 150/99 (!) 151/93 (!) 143/98  Pulse: (!) 59 75 69 66  Resp: 18 18 17 19   Temp: 98 F (36.7 C) 98.7 F (37.1 C) 97.9 F (36.6 C) 98.6 F (37 C)  TempSrc:  Oral Oral Oral  SpO2: 98% 97% 99% 98%  Weight:      Height:        Intake/Output Summary (Last 24 hours) at 04/09/2021 1502 Last data filed at 04/09/2021 1200 Gross per 24 hour  Intake 720 ml  Output 1 ml  Net 719 ml   Filed Weights   04/05/21 0852 04/07/21 0926 04/07/21 1243  Weight: 54.4 kg 56 kg 54 kg    Examination: Calm, NAD Decreased breath sounds no wheezing Regular S1-S2 no gallops Soft benign positive bowel sounds No edema Awake and oriented Mood and affect appropriate in current setting    Data Reviewed: I have  personally reviewed following labs and imaging studies  CBC: Recent Labs  Lab 04/05/21 0918 04/06/21 0202 04/07/21 0550 04/08/21 0306 04/09/21 0235  WBC 15.9* 24.0* 17.3* 13.1* 11.0*  NEUTROABS 14.2* 21.8* 14.9* 10.8* 8.8*  HGB 11.2* 10.7* 10.1* 11.0* 11.0*  HCT 35.3* 33.2* 32.1* 33.7* 33.7*  MCV 98.3 96.2 96.4 95.2 94.9  PLT 218 205 237 253 656   Basic Metabolic Panel: Recent Labs  Lab 04/05/21 0918 04/06/21 0202 04/07/21 0550 04/08/21 0306 04/09/21 0235  NA 139 135 137 133* 136  K 4.1 4.9 4.5 3.4* 3.5  CL 102 102 103 101 99  CO2 25 21* 23 22 25   GLUCOSE 65* 90 67* 77 81  BUN 32* 48* 64* 29* 40*  CREATININE 5.35* 6.03* 7.46* 4.84* 6.32*  CALCIUM 8.3* 7.7* 8.0* 7.4* 8.0*  MG  --  2.0 2.2 1.9 2.1  PHOS  --  3.3 3.5 2.5 3.0   GFR: Estimated Creatinine Clearance: 6.1 mL/min (A) (by C-G formula based on SCr of 6.32 mg/dL (H)). Liver Function Tests: Recent Labs  Lab 04/05/21 0918 04/06/21 0202 04/07/21 0550 04/08/21 0306 04/09/21 0235  AST 34 34 21 24 20   ALT 20 18 18 17 19   ALKPHOS 61 69 84 79 70  BILITOT 1.0 0.8 0.7 0.6 0.5  PROT 5.7* 5.2* 5.3* 5.5* 5.3*  ALBUMIN 2.8* 2.2* 2.2* 2.3* 2.2*  No results for input(s): LIPASE, AMYLASE in the last 168 hours. No results for input(s): AMMONIA in the last 168 hours. Coagulation Profile: No results for input(s): INR, PROTIME in the last 168 hours. Cardiac Enzymes: No results for input(s): CKTOTAL, CKMB, CKMBINDEX, TROPONINI in the last 168 hours. BNP (last 3 results) No results for input(s): PROBNP in the last 8760 hours. HbA1C: No results for input(s): HGBA1C in the last 72 hours. CBG: Recent Labs  Lab 04/08/21 1622 04/08/21 2040 04/08/21 2358 04/09/21 0431 04/09/21 0819  GLUCAP 76 104* 114* 74 78   Lipid Profile: No results for input(s): CHOL, HDL, LDLCALC, TRIG, CHOLHDL, LDLDIRECT in the last 72 hours.  Thyroid Function Tests: No results for input(s): TSH, T4TOTAL, FREET4, T3FREE, THYROIDAB in the  last 72 hours.  Anemia Panel: No results for input(s): VITAMINB12, FOLATE, FERRITIN, TIBC, IRON, RETICCTPCT in the last 72 hours.  Sepsis Labs: Recent Labs  Lab 04/05/21 1020 04/05/21 1350  PROCALCITON  --  8.21  LATICACIDVEN 2.1* 2.1*    Recent Results (from the past 240 hour(s))  Culture, blood (routine x 2)     Status: None (Preliminary result)   Collection Time: 04/05/21  9:18 AM   Specimen: Right Antecubital; Blood  Result Value Ref Range Status   Specimen Description RIGHT ANTECUBITAL  Final   Special Requests   Final    BOTTLES DRAWN AEROBIC AND ANAEROBIC Blood Culture results may not be optimal due to an inadequate volume of blood received in culture bottles   Culture   Final    NO GROWTH 4 DAYS Performed at [facility] [address] [place], [place] [address]    Report Status PENDING  Incomplete  Resp Panel by RT-PCR (Flu A&B, Covid) Nasopharyngeal Swab     Status: Abnormal   Collection Time: 04/05/21  9:18 AM   Specimen: Nasopharyngeal Swab; Nasopharyngeal(NP) swabs in vial transport medium  Result Value Ref Range Status   SARS Coronavirus 2 by RT PCR POSITIVE (A) NEGATIVE Final    Comment: (NOTE) SARS-CoV-2 target nucleic acids are DETECTED.  The SARS-CoV-2 RNA is generally detectable in upper respiratory specimens during the acute phase of infection. Positive results are indicative of the presence of the identified virus, but do not rule out bacterial infection or co-infection with other pathogens not detected by the test. Clinical correlation with patient history and other diagnostic information is necessary to determine patient infection status. The expected result is Negative.  Fact Sheet for Patients: EntrepreneurPulse.com.au  Fact Sheet for Healthcare Providers: IncredibleEmployment.be  This test is not yet approved or cleared by the [place] FDA and  has been authorized for detection and/or diagnosis  of SARS-CoV-2 by FDA under an Emergency Use Authorization (EUA).  This EUA will remain in effect (meaning this test can be used) for the duration of  the COVID-19 declaration under Section 564(b)(1) of the A ct, 21 U.S.C. section 360bbb-3(b)(1), unless the authorization is terminated or revoked sooner.     Influenza A by PCR NEGATIVE NEGATIVE Final   Influenza B by PCR NEGATIVE NEGATIVE Final    Comment: (NOTE) The Xpert Xpress SARS-CoV-2/FLU/RSV plus assay is intended as an aid in the diagnosis of influenza from Nasopharyngeal swab specimens and should not be used as a sole basis for treatment. Nasal washings and aspirates are unacceptable for Xpert Xpress SARS-CoV-2/FLU/RSV testing.  Fact Sheet for Patients: EntrepreneurPulse.com.au  Fact Sheet for Healthcare Providers: IncredibleEmployment.be  This test is not yet approved or cleared by the  [place] Architectural technologist and has been authorized for detection and/or diagnosis of SARS-CoV-2 by FDA under an Print production planner (EUA). This EUA will remain in effect (meaning this test can be used) for the duration of the COVID-19 declaration under Section 564(b)(1) of the Act, 21 U.S.C. section 360bbb-3(b)(1), unless the authorization is terminated or revoked.  Performed at [facility], [place] [address], [place], [place] [address]   Culture, blood (routine x 2)     Status: None (Preliminary result)   Collection Time: 04/05/21 10:22 AM   Specimen: BLOOD RIGHT WRIST  Result Value Ref Range Status   Specimen Description BLOOD RIGHT WRIST  Final   Special Requests   Final    BOTTLES DRAWN AEROBIC ONLY Blood Culture results may not be optimal due to an inadequate volume of blood received in culture bottles   Culture   Final    NO GROWTH 4 DAYS Performed at [facility], [place] [address], [place], [place] [address]    Report Status PENDING  Incomplete  MRSA Next Gen by PCR, Nasal      Status: Abnormal   Collection Time: 04/06/21 10:50 AM   Specimen: Nasal Mucosa; Nasal Swab  Result Value Ref Range Status   MRSA by PCR Next Gen DETECTED (A) NOT DETECTED Final    Comment: RESULT CALLED TO, READ BACK BY AND VERIFIED WITH: RN R.AGUSTIS ON [id number] AT 82 BY E.PARRISH (NOTE) The GeneXpert MRSA Assay (FDA approved for NASAL specimens only), is one component of a comprehensive MRSA colonization surveillance program. It is not intended to diagnose MRSA infection nor to guide or monitor treatment for MRSA infections. Test performance is not FDA approved in patients less than 40 years old. Performed at [facility], [place] [address] [place], [place] [address]          Radiology Studies: ECHOCARDIOGRAM COMPLETE  Result Date: 04/07/2021    ECHOCARDIOGRAM REPORT   Patient Name:   AYLIN RHOADS Date of Exam: 04/07/2021 Medical Rec #:  [id number]   Height:       65.0 in Accession #:    [id number]  Weight:       119.0 lb Date of Birth:  November 10, 1940   BSA:          1.587 m Patient Age:    87 years    BP:           104/64 mmHg Patient Gender: F           HR:           72 bpm. Exam Location:  Inpatient Procedure: 2D Echo, Cardiac Doppler and Color Doppler Indications:    Elevated Troponin  History:        Patient has prior history of Echocardiogram examinations, most                 recent 05/01/2018. Risk Factors:Family History of Coronary Artery                 Disease, Hypertension, Diabetes and Dyslipidemia. Covid 19                 positive.  Sonographer:    Merrie Roof RDCS Referring Phys: [id number] [place]  1. Left ventricular ejection fraction, by estimation, is 50 to 55%. The left ventricle has low normal function. The left ventricle has no regional wall motion abnormalities. Left ventricular diastolic function could not be evaluated.  2. Right ventricular systolic  function is normal. The right ventricular size is normal.  3. Left atrial size was severely dilated.  4.  Right atrial size was moderately dilated.  5. The mitral valve is normal in structure. Trivial mitral valve regurgitation. No evidence of mitral stenosis.  6. The aortic valve is tricuspid. Aortic valve regurgitation is not visualized. Aortic valve sclerosis is present, with no evidence of aortic valve stenosis.  7. Aortic dilatation noted. There is borderline dilatation of the ascending aorta, measuring 39 mm.  8. The inferior vena cava is normal in size with greater than 50% respiratory variability, suggesting right atrial pressure of 3 mmHg. FINDINGS  Left Ventricle: Left ventricular ejection fraction, by estimation, is 50 to 55%. The left ventricle has low normal function. The left ventricle has no regional wall motion abnormalities. The left ventricular internal cavity size was normal in size. There is no left ventricular hypertrophy. Left ventricular diastolic function could not be evaluated due to atrial fibrillation. Left ventricular diastolic function could not be evaluated. Right Ventricle: The right ventricular size is normal. Right ventricular systolic function is normal. Left Atrium: Left atrial size was severely dilated. Right Atrium: Right atrial size was moderately dilated. Pericardium: There is no evidence of pericardial effusion. Mitral Valve: The mitral valve is normal in structure. Mild mitral annular calcification. Trivial mitral valve regurgitation. No evidence of mitral valve stenosis. Tricuspid Valve: The tricuspid valve is normal in structure. Tricuspid valve regurgitation is mild . No evidence of tricuspid stenosis. Aortic Valve: The aortic valve is tricuspid. Aortic valve regurgitation is not visualized. Aortic valve sclerosis is present, with no evidence of aortic valve stenosis. Pulmonic Valve: The pulmonic valve was normal in structure. Pulmonic valve regurgitation is not visualized. No evidence of pulmonic stenosis. Aorta: Aortic dilatation noted. There is borderline dilatation of the  ascending aorta, measuring 39 mm. Venous: The inferior vena cava is normal in size with greater than 50% respiratory variability, suggesting right atrial pressure of 3 mmHg. IAS/Shunts: No atrial level shunt detected by color flow Doppler.  LEFT ATRIUM              Index        RIGHT ATRIUM           Index LA Vol (A2C):   120.0 ml 75.63 ml/m  RA Area:     21.60 cm LA Vol (A4C):   90.9 ml  57.29 ml/m  RA Volume:   64.70 ml  40.78 ml/m LA Biplane Vol: 109.0 ml 68.70 ml/m  TRICUSPID VALVE TR Peak grad:   26.4 mmHg TR Vmax:        257.00 cm/s Kirk Ruths MD Electronically signed by Kirk Ruths MD Signature Date/Time: 04/07/2021/4:04:20 PM    Final         Scheduled Meds:  vitamin C  500 mg Oral Daily   cetirizine  5 mg Oral QPM   Chlorhexidine Gluconate Cloth  6 each Topical Q0600   Chlorhexidine Gluconate Cloth  6 each Topical Q0600   cinacalcet  30 mg Oral Q M,W,F   citalopram  40 mg Oral Daily   darbepoetin (ARANESP) injection - DIALYSIS  40 mcg Intravenous Q Wed-HD   docusate sodium  100 mg Oral Daily   ezetimibe  10 mg Oral Daily   famotidine  20 mg Oral Daily   heparin  5,000 Units Subcutaneous Q8H   hydrALAZINE  10 mg Oral TID   levothyroxine  125 mcg Oral Daily   molnupiravir EUA  4 capsule  Oral BID   mometasone-formoterol  2 puff Inhalation BID   multivitamin  1 tablet Oral Daily   mupirocin ointment   Nasal BID   pantoprazole  20 mg Oral Daily   pramipexole  0.125 mg Oral QHS   sodium chloride flush  3 mL Intravenous Q12H   zinc sulfate  220 mg Oral Daily   Continuous Infusions:  sodium chloride Stopped (04/06/21 0236)   vancomycin Stopped (04/07/21 1432)    Assessment & Plan:   Principal Problem:   Sepsis ([place]) Active Problems:   Acquired hypothyroidism   ESRD on hemodialysis (HCC)   COPD (chronic obstructive pulmonary disease) ([place])   Anxiety   Do not resuscitate   COVID-19 virus infection   Infection of hemodialysis tunneled catheter ([place])   Elevated  troponin   Sepsis secondary infection of ?hemodialysis catheter vs covid vs pna IR evaluated patient, daily did not find any indication for catheter removal/replacement at this time. Procalcitonin was elevated Cta with pna v.s aspiration see full result Treatment for COVID protocol as below  lactic acid, WBC improving If blood cultures become positive we will remove catheter however so far negative On room air with O2 sats in the 90s Will continue current IV antibiotics 1/6 IDs input was appreciated Unable to remove HD catheter today ID rec. 14 days vanc post cath replacement      COVID-19 infection: Acute.  Continue covid protocal 1/6 continue monupiravir, vit c and zinc Cta neg for PE         Hypoglycemia: Acute.  On admission glucose was noted to be as low as 65.  She was able to be given with improvement in blood sugar to 85. -Hypoglycemic protocols 1/6 bg stable      Right-sided chest pain: Acute.   CTA negative for PE        Atrial fibrillation: Patient was noted to be in atrial fibrillation with heart rates initially elevated into the 120s in route with EMS, but currently rate controlled.  Not on any anticoagulation due to prior history of bleeding. -Continue to monitor   ESRD on HD:  HD Monday Wednesday Friday schedule       Essential hypertension:  Stable, Monitor   Elevated troponin: Acute.  Will consult cardiology as cta with  coronary artery calcif. Ck echo   COPD, without acute exacerbation -Continue levocetirizine and pharmacy substitution for Symbicort   Hypothyroidism: TSH 7.11 on 02/23/2021. TSH normal -Continue levothyroxine   Depression and anxiety -Continue Celexa   Chronic pain -Continue home medication regimen   DVT prophylaxis: Heparin Code Status: DNR Family Communication: none at bedside Disposition Plan:  Status is: Inpatient  Remains inpatient appropriate because: IV treatment, removal of HD cath. Need  HD            LOS: 4 days   Time spent 35 minutes with more than 50% on [place], MD [facility] Pager [phone]  If 7PM-7AM, please contact night-coverage 04/09/2021, 3:02 PM

## 2021-04-09 NOTE — Progress Notes (Signed)
[facility] for Infectious Disease  Date of Admission:  04/05/2021   Total days of inpatient antibiotics 5  Principal Problem:   Sepsis ([place]) Active Problems:   Acquired hypothyroidism   ESRD on hemodialysis ([place])   COPD (chronic obstructive pulmonary disease) (HCC)   Anxiety   Do not resuscitate   COVID-19 virus infection   Infection of hemodialysis tunneled catheter ([place])   Elevated troponin          Assessment: 81 year old female with ESRD on hemodialysis via left IJ permacath, COPD, history of MRSA bacteremia in 2019 status right HD catheter removal admitted for sepsis secondary to suspected HD catheter infection. Radiology was consulted and catheter remains in place as there was no indication for catheter removal/replacement at that time.  Infectious disease consulted for antibiotic management.   #HD catheter site infection #Hx of MRSA PNA and bacteremia SP HD catheter removal #COVID infection -Catheter insertion site is tender and erythematous. Although blood Cx are negative would replace catheter. She has blood Cx+ MRSA in 2019 and had her right chest HD catheter removed during that hospitalization. She reports having the current HD catheter since 2019.   -TTE showed no signs of vegetation  Recommendations:  -Remove HD catheter, plan on cath removal on 04/10/21 -Continue vancomycin, plan on 2 weeks of antibiotics from catheter removal. -ID will sign off    Microbiology:   Antibiotics: Vancomycin 1/2-p Cefepime 1/3-p Aztreonam 1/2-3 Molnupiravir 1/2-p   Cultures: Blood 1/2-NGTD   SUBJECTIVE: Pt is resting in bed. No new complaints. No significant overnight events.   Review of Systems: ROS   Scheduled Meds:  vitamin C  500 mg Oral Daily   cetirizine  5 mg Oral QPM   Chlorhexidine Gluconate Cloth  6 each Topical Q0600   Chlorhexidine Gluconate Cloth  6 each Topical Q0600   cinacalcet  30 mg Oral Q M,W,F   citalopram  40 mg Oral Daily    darbepoetin (ARANESP) injection - DIALYSIS  40 mcg Intravenous Q Wed-HD   docusate sodium  100 mg Oral Daily   ezetimibe  10 mg Oral Daily   famotidine  20 mg Oral Daily   heparin  5,000 Units Subcutaneous Q8H   hydrALAZINE  10 mg Oral TID   levothyroxine  125 mcg Oral Daily   molnupiravir EUA  4 capsule Oral BID   mometasone-formoterol  2 puff Inhalation BID   multivitamin  1 tablet Oral Daily   mupirocin ointment   Nasal BID   pantoprazole  20 mg Oral Daily   pramipexole  0.125 mg Oral QHS   sodium chloride flush  3 mL Intravenous Q12H   zinc sulfate  220 mg Oral Daily   Continuous Infusions:  sodium chloride Stopped (04/06/21 0236)   vancomycin Stopped (04/07/21 1432)   PRN Meds:.sodium chloride, acetaminophen, chlorpheniramine-HYDROcodone, dextrose, guaiFENesin-dextromethorphan, HYDROcodone-acetaminophen, Ipratropium-Albuterol, nystatin cream Allergies  Allergen Reactions   Amoxicillin Rash    Has patient had a PCN reaction causing immediate rash, facial/tongue/throat swelling, SOB or lightheadedness with hypotension:YES Has patient had a PCN reaction causing severe rash involving mucus membranes or skin necrosis: Yes Has patient had a PCN reaction that required hospitalization No Has patient had a PCN reaction occurring within the last 10 years: Yes If all of the above answers are "NO", then may proceed with Cephalosporin use. Cephalosporins ok   Elemental Sulfur Hives   Sulfur Hives   Peg 3350-Electrolytes Other (See Comments)   Sulfa Drugs  Cross Reactors Hives and Itching   Miralax [Polyethylene Glycol] Itching   Mixed Grasses    Percocet [Oxycodone-Acetaminophen] Itching and Rash    Did not happen last time she took it   Sulfa Antibiotics Hives    OBJECTIVE: Vitals:   04/09/21 0824 04/09/21 1614 04/09/21 2007 04/09/21 2254  BP: (!) 143/98 (!) 155/99  (!) 149/83  Pulse: 66 82 81 73  Resp: 19 18 16 18   Temp: 98.6 F (37 C) 98.4 F (36.9 C)  98.2 F (36.8 C)   TempSrc: Oral Oral  Oral  SpO2: 98% 100% 92% 97%  Weight:      Height:       Body mass index is 19.81 kg/m.  Physical Exam    Lab Results Lab Results  Component Value Date   WBC 11.0 (H) 04/09/2021   HGB 11.0 (L) 04/09/2021   HCT 33.7 (L) 04/09/2021   MCV 94.9 04/09/2021   PLT 269 04/09/2021    Lab Results  Component Value Date   CREATININE 6.32 (H) 04/09/2021   BUN 40 (H) 04/09/2021   NA 136 04/09/2021   K 3.5 04/09/2021   CL 99 04/09/2021   CO2 25 04/09/2021    Lab Results  Component Value Date   ALT 19 04/09/2021   AST 20 04/09/2021   ALKPHOS 70 04/09/2021   BILITOT 0.5 04/09/2021        Laurice Record, [place] for Infectious Disease [facility] 04/09/2021, 10:57 PM

## 2021-04-09 NOTE — Progress Notes (Signed)
[facility] Progress Note  Name: [name] MRN: [id number] DOB: [birth year]  Subjective:  last HD on 1/4 with 1.8 kg UF.  ID consulted yesterday and they recommended tunn catheter removal.  RN is calling IR to make sure patient is on the schedule.  Feels ok.  Her daughter has just come to bedside and I updated her.  Review of systems:  Denies shortness of breath or chest pain  Denies n/v No dizziness or cramping with HD  ------------------  Background on consult:  [name] is a 81 y.o. female with a history of ESRD on HD, COPD, GERD, HTN, and p afib who had HD today and was sent to the ER from the HD unit.  She had 1 hour and 11 minutes of HD when she began shaking and vomited per charting.  Treatment was ended prematurely.  She was found to be covid positive.  Nephrology is consulted for assistance with management of HD.   Note per charting EMS reported Afib with rate of 80's to 120's during transport here.  There is concern about her catheter site as well - this has been red and her family is concerned about an infection.  CXR with no acute cardiopulmonary process.  She left at 53 kg today from HD and left at 51.5 kg her last treatment.  Spoke with her daughter in law at bedside who pt states is also her power of attorney.  She states that her breathing is doing ok.  Nausea and vomiting earlier today.  She does ok with her fluids.   Intake/Output Summary (Last 24 hours) at 04/09/2021 1047 Last data filed at 04/09/2021 0600 Gross per 24 hour  Intake 960 ml  Output 1 ml  Net 959 ml    Vitals:  Vitals:   04/08/21 0910 04/08/21 2050 04/09/21 0436 04/09/21 0824  BP: (!) 157/97 (!) 150/99 (!) 151/93 (!) 143/98  Pulse: (!) 59 75 69 66  Resp: 18 18 17 19   Temp: 98 F (36.7 C) 98.7 F (37.1 C) 97.9 F (36.6 C) 98.6 F (37 C)  TempSrc:  Oral Oral Oral  SpO2: 98% 97% 99% 98%  Weight:      Height:         Physical Exam: General: elderly female in bed in NAD HEENT:  NCAT  Neck: supple trachea midline  Cards: S1S2 no rub Lungs: clear and reduced; normal work of breathing on room air  Abdomen:  soft/NT/ND Extremities: no edema  Psych normal mood and affect Neuro alert and oriented x 3 provides hx and follows commands Access Left IJ tunn catheter; catheter exit site with large scab; no erythema  Medications reviewed   Labs:  BMP Latest Ref Rng & Units 04/09/2021 04/08/2021 04/07/2021  Glucose 70 - 99 mg/dL 81 77 67(L)  BUN 8 - 23 mg/dL 40(H) 29(H) 64(H)  Creatinine 0.44 - 1.00 mg/dL 6.32(H) 4.84(H) 7.46(H)  BUN/Creat Ratio 12 - 28 - - -  Sodium 135 - 145 mmol/L 136 133(L) 137  Potassium 3.5 - 5.1 mmol/L 3.5 3.4(L) 4.5  Chloride 98 - 111 mmol/L 99 101 103  CO2 22 - 32 mmol/L 25 22 23   Calcium 8.9 - 10.3 mg/dL 8.0(L) 7.4(L) 8.0(L)   Outpatient HD orders:  [facility]  MWF BF 400/ DF 500 EDW 52 kg 2 k/2.5 Ca Tunn catheter Calcitriol 0.75 mcg three times a week Last Rec'd mircera 50 mcg on 12/21 and this is no longer ordered  Assessment/Plan:   #  ESRD  - HD per MWF schedule    # Covid positive - therapies per primary team - on molnupiravir   # paroxysmal a fib - per primary team    # Anemia CKD  - aranesp 40 mcg weekly on wed.  May be able to hold next dose depending on trends    # Concern for catheter exit site infection  - s/p vanc and aztreonam.  She has been improving on broad spectrum abx  - appreciate ID - abx per ID.  ID recommends 14 days of vanc post catheter replacement.  - I have reconsulted IR for tunneled dialysis catheter removal and replacement at a new site - blood cultures pending - NGTD  - ID consulted and ID, myself, and medicine attending are all requesting tunneled catheter removal   # metabolic bone disease  - continue calcitriol.  on sensipar at home per charting; was reordered here. Phos acceptable and a bit low.  Discontinued auryxia for now as phos 2.5 earlier  Disposition - getting HD today or  overnight.  After this HD treatment once tunneled dialysis catheter is exchanged to a new site she is able to be discharged from a renal standpoint.     Claudia Desanctis, MD 04/09/2021 11:10 AM

## 2021-04-09 NOTE — Progress Notes (Signed)
Unfortunately due to multiple emergent procedures in IR today we are unable to accommodate HD catheter removal and new site placement.   Will plan for procedure AM of 1/7 pending any further emergent procedures.  Patient to be NPO at midnight, IR APP to consent patient when in IR.  Please call IR with questions or concerns.   Candiss Norse, PA-C

## 2021-04-10 ENCOUNTER — Inpatient Hospital Stay (HOSPITAL_COMMUNITY): Payer: Medicare Other

## 2021-04-10 ENCOUNTER — Encounter (HOSPITAL_COMMUNITY): Payer: Self-pay | Admitting: Internal Medicine

## 2021-04-10 DIAGNOSIS — U071 COVID-19: Secondary | ICD-10-CM

## 2021-04-10 HISTORY — PX: IR FLUORO GUIDE CV LINE RIGHT: IMG2283

## 2021-04-10 HISTORY — PX: IR US GUIDE VASC ACCESS RIGHT: IMG2390

## 2021-04-10 HISTORY — PX: IR REMOVAL TUN CV CATH W/O FL: IMG2289

## 2021-04-10 LAB — CBC WITH DIFFERENTIAL/PLATELET
Abs Immature Granulocytes: 0.12 10*3/uL — ABNORMAL HIGH (ref 0.00–0.07)
Basophils Absolute: 0.1 10*3/uL (ref 0.0–0.1)
Basophils Relative: 1 %
Eosinophils Absolute: 0.5 10*3/uL (ref 0.0–0.5)
Eosinophils Relative: 5 %
HCT: 35.1 % — ABNORMAL LOW (ref 36.0–46.0)
Hemoglobin: 11.2 g/dL — ABNORMAL LOW (ref 12.0–15.0)
Immature Granulocytes: 1 %
Lymphocytes Relative: 11 %
Lymphs Abs: 1.2 10*3/uL (ref 0.7–4.0)
MCH: 30.4 pg (ref 26.0–34.0)
MCHC: 31.9 g/dL (ref 30.0–36.0)
MCV: 95.1 fL (ref 80.0–100.0)
Monocytes Absolute: 0.9 10*3/uL (ref 0.1–1.0)
Monocytes Relative: 9 %
Neutro Abs: 7.3 10*3/uL (ref 1.7–7.7)
Neutrophils Relative %: 73 %
Platelets: 305 10*3/uL (ref 150–400)
RBC: 3.69 MIL/uL — ABNORMAL LOW (ref 3.87–5.11)
RDW: 15.1 % (ref 11.5–15.5)
WBC: 10.1 10*3/uL (ref 4.0–10.5)
nRBC: 0 % (ref 0.0–0.2)

## 2021-04-10 LAB — COMPREHENSIVE METABOLIC PANEL
ALT: 15 U/L (ref 0–44)
AST: 19 U/L (ref 15–41)
Albumin: 2.3 g/dL — ABNORMAL LOW (ref 3.5–5.0)
Alkaline Phosphatase: 80 U/L (ref 38–126)
Anion gap: 11 (ref 5–15)
BUN: 48 mg/dL — ABNORMAL HIGH (ref 8–23)
CO2: 23 mmol/L (ref 22–32)
Calcium: 8.4 mg/dL — ABNORMAL LOW (ref 8.9–10.3)
Chloride: 101 mmol/L (ref 98–111)
Creatinine, Ser: 7.67 mg/dL — ABNORMAL HIGH (ref 0.44–1.00)
GFR, Estimated: 5 mL/min — ABNORMAL LOW (ref >60)
Glucose, Bld: 79 mg/dL (ref 70–99)
Potassium: 3.7 mmol/L (ref 3.5–5.1)
Sodium: 135 mmol/L (ref 135–145)
Total Bilirubin: 0.7 mg/dL (ref 0.3–1.2)
Total Protein: 5.5 g/dL — ABNORMAL LOW (ref 6.5–8.1)

## 2021-04-10 LAB — CULTURE, BLOOD (ROUTINE X 2)
Culture: NO GROWTH
Culture: NO GROWTH

## 2021-04-10 LAB — PHOSPHORUS: Phosphorus: 3.4 mg/dL (ref 2.5–4.6)

## 2021-04-10 LAB — GLUCOSE, CAPILLARY
Glucose-Capillary: 73 mg/dL (ref 70–99)
Glucose-Capillary: 70 mg/dL (ref 70–99)

## 2021-04-10 LAB — MAGNESIUM: Magnesium: 2.2 mg/dL (ref 1.7–2.4)

## 2021-04-10 MED ORDER — VANCOMYCIN HCL IN DEXTROSE 1-5 GM/200ML-% IV SOLN: 1000.0000 mg | INTRAVENOUS | Status: DC

## 2021-04-10 MED ORDER — MIDAZOLAM HCL 2 MG/2ML IJ SOLN
INTRAMUSCULAR | Status: AC
  Filled 2021-04-10: qty 2

## 2021-04-10 MED ORDER — FENTANYL CITRATE (PF) 100 MCG/2ML IJ SOLN
INTRAMUSCULAR | Status: AC | PRN
  Administered 2021-04-10: 25 ug via INTRAVENOUS

## 2021-04-10 MED ORDER — VANCOMYCIN HCL IN DEXTROSE 1-5 GM/200ML-% IV SOLN
INTRAVENOUS | Status: AC
  Administered 2021-04-10: 1000 mg
  Filled 2021-04-10: qty 200

## 2021-04-10 MED ORDER — HEPARIN SODIUM (PORCINE) 1000 UNIT/ML IJ SOLN
INTRAMUSCULAR | Status: AC
  Administered 2021-04-10: 1000 [IU]
  Filled 2021-04-10: qty 10

## 2021-04-10 MED ORDER — LIDOCAINE HCL 1 % IJ SOLN
INTRAMUSCULAR | Status: AC
  Filled 2021-04-10: qty 20

## 2021-04-10 MED ORDER — GELATIN ABSORBABLE 12-7 MM EX MISC
CUTANEOUS | Status: AC
  Filled 2021-04-10: qty 1

## 2021-04-10 MED ORDER — HEPARIN SODIUM (PORCINE) 1000 UNIT/ML IJ SOLN
INTRAMUSCULAR | Status: AC
  Filled 2021-04-10: qty 4

## 2021-04-10 MED ORDER — MIDAZOLAM HCL 2 MG/2ML IJ SOLN
INTRAMUSCULAR | Status: AC | PRN
Start: 2021-04-10 — End: 2021-04-10
  Administered 2021-04-10: .5 mg via INTRAVENOUS

## 2021-04-10 MED ORDER — FENTANYL CITRATE (PF) 100 MCG/2ML IJ SOLN
INTRAMUSCULAR | Status: AC
  Filled 2021-04-10: qty 2

## 2021-04-10 MED ORDER — CEFAZOLIN SODIUM-DEXTROSE 2-4 GM/100ML-% IV SOLN
INTRAVENOUS | Status: AC
  Filled 2021-04-10: qty 100

## 2021-04-10 MED ORDER — MUPIROCIN 2 % EX OINT: TOPICAL_OINTMENT | Freq: Two times a day (BID) | CUTANEOUS | 0 refills | Status: DC

## 2021-04-10 MED ORDER — ASCORBIC ACID 500 MG PO TABS: 500.0000 mg | ORAL_TABLET | Freq: Every day | ORAL | 0 refills | Status: AC

## 2021-04-10 MED ORDER — ZINC SULFATE 220 (50 ZN) MG PO CAPS: 220.0000 mg | ORAL_CAPSULE | Freq: Every day | ORAL | 0 refills | Status: AC

## 2021-04-10 MED ORDER — ACETAMINOPHEN 325 MG PO TABS: 650.0000 mg | ORAL_TABLET | Freq: Four times a day (QID) | ORAL | Status: DC | PRN

## 2021-04-10 NOTE — Progress Notes (Signed)
[facility] Progress Note  Name: [name] MRN: [id number] DOB: [birth year]  Subjective:  Seen and examined on dialysis.  Procedure supervised.  RIJ tunn catheter in use. Blood pressure 114/76. Her old Left IJ tunn catheter was removed and a new right IJ tunn catheter was placed. Tolerating goal of 2.5kg.  I let RN know she could further decrease 0.5 kg if needed.  Seen via treatment window given covid positive to preserve PPE.   ------------------  Background on consult:  [name] is a 81 y.o. female with a history of ESRD on HD, COPD, GERD, HTN, and p afib who had HD today and was sent to the ER from the HD unit.  She had 1 hour and 11 minutes of HD when she began shaking and vomited per charting.  Treatment was ended prematurely.  She was found to be covid positive.  Nephrology is consulted for assistance with management of HD.   Note per charting EMS reported Afib with rate of 80's to 120's during transport here.  There is concern about her catheter site as well - this has been red and her family is concerned about an infection.  CXR with no acute cardiopulmonary process.  She left at 53 kg today from HD and left at 51.5 kg her last treatment.  Spoke with her daughter in law at bedside who pt states is also her power of attorney.  She states that her breathing is doing ok.  Nausea and vomiting earlier today.  She does ok with her fluids.   Intake/Output Summary (Last 24 hours) at 04/10/2021 1202 Last data filed at 04/10/2021 0800 Gross per 24 hour  Intake 460 ml  Output --  Net 460 ml    Vitals:  Vitals:   04/10/21 1040 04/10/21 1100 04/10/21 1130 04/10/21 1200  BP: 137/85 111/74 107/77 114/76  Pulse: 71 80 77 85  Resp: 16 20 16 18   Temp:      TempSrc:      SpO2:      Weight:      Height:         Physical Exam:  General: elderly female in bed in NAD HEENT: NCAT  Lungs:normal work of breathing on room air  Extremities: no edema  Psych normal mood and  affect Neuro conversant with staff  Access RIJ tunn catheter   Medications reviewed   Labs:  BMP Latest Ref Rng & Units 04/10/2021 04/09/2021 04/08/2021  Glucose 70 - 99 mg/dL 79 81 77  BUN 8 - 23 mg/dL 48(H) 40(H) 29(H)  Creatinine 0.44 - 1.00 mg/dL 7.67(H) 6.32(H) 4.84(H)  BUN/Creat Ratio 12 - 28 - - -  Sodium 135 - 145 mmol/L 135 136 133(L)  Potassium 3.5 - 5.1 mmol/L 3.7 3.5 3.4(L)  Chloride 98 - 111 mmol/L 101 99 101  CO2 22 - 32 mmol/L 23 25 22   Calcium 8.9 - 10.3 mg/dL 8.4(L) 8.0(L) 7.4(L)   Outpatient HD orders:  [facility]  MWF BF 400/ DF 500 EDW 52 kg 2 k/2.5 Ca Tunn catheter Calcitriol 0.75 mcg three times a week Last Rec'd mircera 50 mcg on 12/21 and this is no longer ordered  Assessment/Plan:   # ESRD  - HD per MWF schedule normally - she is getting HD now off schedule due to patient volumes    # Covid positive - therapies per primary team - s/p molnupiravir   # paroxysmal a fib - per primary team    #  Anemia CKD  - aranesp 40 mcg weekly on wed.  May be able to hold next dose depending on trends    # Concern for catheter exit site infection  - catheter removed and new tunneled catheter placed at a new site - appreciate ID.  ID recommends 14 days of vanc post catheter replacement.  End date for vanc is 1/21 (vanc 1 gram with HD until 04/24/21) - blood cultures pending - NGTD   # metabolic bone disease  - continue calcitriol.  on sensipar at home per charting; was reordered here. For now off auryxia phos 2.5 earlier this hospitalization; reassess outpatient  Disposition - getting HD currently.  Upon completion of today's HD she is stable for discharge from a renal standpoint     Claudia Desanctis, MD 04/10/2021 12:13 PM

## 2021-04-10 NOTE — Sedation Documentation (Signed)
Vancomycin stopped per pharmacy due to pt receiving vancomycin during dialysis

## 2021-04-10 NOTE — Progress Notes (Signed)
DISCHARGE NOTE [name] to be discharged Michelle per MD order. Discussed prescriptions and follow up appointments with the patient. Prescriptions given to patient; medication list explained in detail. Patient verbalized understanding.  Skin clean, dry and intact without evidence of skin break down, no evidence of skin tears noted. IV catheter discontinued intact. Site without signs and symptoms of complications. Dressing and pressure applied. Pt denies pain at the site currently. No complaints noted.  Patient free of lines, drains, and wounds.   An After Visit Summary (AVS) was printed and given to the patient. Patient escorted via wheelchair, and discharged Michelle via private auto.  Berneta Levins, RN

## 2021-04-10 NOTE — Consult Note (Signed)
HPI:  The patient has had a H&P performed within the last 30 days, all history, medications, and exam have been reviewed. The patient denies any interval changes since the H&P.  Medications: Prior to Admission medications   Medication Sig Start Date End Date Taking? Authorizing Provider  acetaminophen (TYLENOL) 500 MG tablet Take 2 tablets (1,000 mg total) by mouth every 8 (eight) hours as needed for mild pain or headache. 11/02/18  Yes [name], FNP  albuterol (VENTOLIN HFA) 108 (90 Base) MCG/ACT inhaler Inhale 2 puffs into the lungs every 6 (six) hours as needed for wheezing or shortness of breath. 05/19/20  Yes [name], FNP  alendronate (FOSAMAX) 70 MG tablet Take 1 tablet (70 mg total) by mouth every Thursday. 05/21/20  Yes [name], FNP  AURYXIA 1 GM 210 MG(Fe) tablet Take 420 mg by mouth 3 (three) times daily with meals. 06/18/20  Yes [provider]  budesonide-formoterol (SYMBICORT) 80-4.5 MCG/ACT inhaler Inhale 2 puffs into the lungs 2 (two) times daily. 05/19/20  Yes [name] F, FNP  cinacalcet (SENSIPAR) 30 MG tablet Take 1 tablet (30 mg total) by mouth every Monday, Wednesday, and Friday. 05/28/17  Yes Allie Bossier, MD  citalopram (CELEXA) 40 MG tablet TAKE 1 TABLET BY MOUTH EVERY DAY Patient taking differently: Take 40 mg by mouth daily. 03/22/21  Yes [name] F, FNP  docusate calcium (SURFAK) 240 MG capsule Take 1 capsule (240 mg total) by mouth daily. 11/19/20  Yes [name] F, FNP  doxercalciferol (HECTOROL) 4 MCG/2ML injection Inject 0.5 mLs (1 mcg total) into the vein every Monday, Wednesday, and Friday with hemodialysis. 05/29/17  Yes Allie Bossier, MD  famotidine (PEPCID) 20 MG tablet Take 1 tablet (20 mg total) by mouth 2 (two) times daily. 11/19/20  Yes [name] F, FNP  HYDROcodone-acetaminophen (NORCO) 7.5-325 MG tablet Take 1 tablet by mouth every 6 (six) hours as needed for moderate pain. 04/24/21  Yes [name] F,  FNP  multivitamin (RENA-VIT) TABS tablet Take 1 tablet by mouth daily.   Yes [provider]  mupirocin cream (BACTROBAN) 2 % Apply 1 application topically 2 (two) times daily. 01/01/21  Yes Ivy Lynn, NP  nystatin cream (MYCOSTATIN) Apply 1 application topically 2 (two) times daily as needed for dry skin. 08/18/20  Yes [name] F, FNP  pantoprazole (PROTONIX) 20 MG tablet Take 1 tablet (20 mg total) by mouth daily. 11/19/20  Yes [name] F, FNP  pramipexole (MIRAPEX) 0.125 MG tablet Take 1 tablet (0.125 mg total) by mouth at bedtime. 02/23/21  Yes [name] F, FNP  ezetimibe (ZETIA) 10 MG tablet TAKE 1 TABLET BY MOUTH EVERY DAY 04/06/21   [name] F, FNP  hydrALAZINE (APRESOLINE) 10 MG tablet TAKE 1 TABLET BY MOUTH THREE TIMES A DAY 04/06/21   [name] F, FNP  HYDROcodone-acetaminophen (NORCO) 7.5-325 MG tablet Take 1 tablet by mouth every 6 (six) hours as needed for moderate pain. Patient not taking: Reported on 04/05/2021 02/23/21   [name], FNP  HYDROcodone-acetaminophen (NORCO) 7.5-325 MG tablet Take 1 tablet by mouth every 6 (six) hours as needed for moderate pain. Patient not taking: Reported on 04/05/2021 03/25/21   [name], FNP  levocetirizine (XYZAL) 5 MG tablet TAKE 1 TABLET BY MOUTH EVERY DAY IN THE EVENING 04/06/21   [name] F, FNP  levothyroxine (SYNTHROID) 125 MCG tablet TAKE 1 TABLET BY MOUTH EVERY DAY 04/06/21   [name]  F, FNP     Vital Signs: BP (!) 155/106 (BP Location: Right Arm)    Pulse 85    Temp 97.8 F (36.6 C) (Oral)    Resp 18    Ht 5\' 5"  (1.651 m)    Wt 119 lb 0.8 oz (54 kg)    SpO2 98%    BMI 19.81 kg/m   Physical Exam Vitals reviewed.  Constitutional:      General: She is not in acute distress.    Appearance: Normal appearance.  HENT:     Head: Normocephalic and atraumatic.     Mouth/Throat:     Mouth: Mucous membranes are moist.  Cardiovascular:     Rate and Rhythm: Normal rate and regular  rhythm.     Heart sounds: Normal heart sounds.  Pulmonary:     Effort: Pulmonary effort is normal.     Breath sounds: Normal breath sounds.  Abdominal:     General: Abdomen is flat. Bowel sounds are normal.     Palpations: Abdomen is soft.  Skin:    General: Skin is warm and dry.     Coloration: Skin is not jaundiced or pale.  Neurological:     Mental Status: She is alert and oriented to person, place, and time.  Psychiatric:        Mood and Affect: Mood normal.        Behavior: Behavior normal.        Judgment: Judgment normal.    Mallampati Score:  MD Evaluation Airway: WNL Heart: WNL Abdomen: WNL Chest/ Lungs: WNL ASA  Classification: 3 Mallampati/Airway Score: Two  Labs:  CBC: Recent Labs    04/07/21 0550 04/08/21 0306 04/09/21 0235 04/10/21 0429  WBC 17.3* 13.1* 11.0* 10.1  HGB 10.1* 11.0* 11.0* 11.2*  HCT 32.1* 33.7* 33.7* 35.1*  PLT 237 253 269 305    COAGS: No results for input(s): INR, APTT in the last 8760 hours.  BMP: Recent Labs    05/19/20 1502 07/15/20 0504 04/07/21 0550 04/08/21 0306 04/09/21 0235 04/10/21 0429  NA 145*   < > 137 133* 136 135  K 4.5   < > 4.5 3.4* 3.5 3.7  CL 99   < > 103 101 99 101  CO2 19*   < > 23 22 25 23   GLUCOSE 111*   < > 67* 77 81 79  BUN 31*   < > 64* 29* 40* 48*  CALCIUM 8.6*   < > 8.0* 7.4* 8.0* 8.4*  CREATININE 5.20*   < > 7.46* 4.84* 6.32* 7.67*  GFRNONAA 7*   < > 5* 9* 6* 5*  GFRAA 8*  --   --   --   --   --    < > = values in this interval not displayed.    LIVER FUNCTION TESTS: Recent Labs    04/07/21 0550 04/08/21 0306 04/09/21 0235 04/10/21 0429  BILITOT 0.7 0.6 0.5 0.7  AST 21 24 20 19   ALT 18 17 19 15   ALKPHOS 84 79 70 80  PROT 5.3* 5.5* 5.3* 5.5*  ALBUMIN 2.2* 2.3* 2.2* 2.3*    Assessment/Plan:   81 yo female with TDC, ID concerned about catheter related infection.  IR was requested for removal of the existing [facility] and placement of a new TDC.   Case reviewed and approved by Dr.  Annamaria Boots.  NPO since MN  VSS CBC stable, no leukocytosis today  Risks and benefits discussed with the patient including, but not  limited to bleeding, infection, vascular injury, pneumothorax which may require chest tube placement, air embolism or even death  All of the patient's questions were answered, patient is agreeable to proceed. Consent signed and in IR.    Signed: Tera Mater 04/10/2021, 8:51 AM

## 2021-04-10 NOTE — Procedures (Signed)
Interventional Radiology Procedure Note  Procedure: RT IJ HD CATH INSERTION LT IJ HD CATH REMOVAL     Complications: None  Estimated Blood Loss:  MIN  Findings: TIP [facility] FULL REPORT IN PACS     [name], MD

## 2021-04-10 NOTE — Discharge Summary (Signed)
[name]:[id number] DOB: [birth year] DOA: 04/05/2021  PCP: [name], FNP  Admit date: 04/05/2021 Discharge date: 04/10/2021  Admitted From: home Disposition:  home  Recommendations for Outpatient Follow-up:  Follow up with PCP in 1 week Please obtain BMP/CBC in one week Please follow up with ID in 2 weeks     Discharge Condition:Stable CODE STATUS:DNR  Diet recommendation: Renal diet    Brief/Interim Summary: Per MEQ:ASTM L [name] is a 81 y.o. female with medical history significant of ESRD on HD, COPD, paroxysmal atrial fibrillation on aspirin, anemia of chronic disease, hypothyroidism, anxiety, and depression who presents after having severe chills with shakes while at dialysis.  She had complained of some chills this morning, but chronically cold natured so she thought nothing of it.  Patient  had a nonproductive cough that has gotten deeper over the last 2 days and complained of some right-sided chest pain especially with taking a deep breath. She had received an hour of hemodialysis prior to onset of chills and shakes.  Noted associated symptoms of nausea, vomiting x1 episode, redness of the hemodialysis catheter of her left chest wall, and joint pains.  Her daughter notes that patient had a similar infection of her hemodialysis catheter due to improper cleaning last year.  This is the third time that she has had COVID-19.  Daughter also notes that the patient has a long history of atrial fibrillation which she is just on aspirin after being taken off of anticoagulation 2 years ago by her cardiologist Dr. Percival Spanish do to bleeding.   In route with EMS patient was noted to be in atrial fibrillation with heart rate 80-120s.   ED Course: On admission Febrile up to 101.5 F with respirations 11-22, and all other vital signs maintained.  Labs significant for WBC 15.9, hemoglobin 11.2, potassium 4.1, CO2 25, BUN 32, creatinine 5.35, glucose 65, and lactic acid 2.1.  COVID-19 screening  was positive.  She had chest x-ray and CT of chest obtained please see results below  Was admitted to the hospital and started on IV antibiotics.    Sepsis secondary infected hemodialysis catheter Also with PNA v.s. aspiration +Covid Covid vs pna Procalcitonin was elevated Cta with pna v.s aspiration see full result  Treatment for COVID protocol as below BCX TDN ID rec. 14 days vancomycin post cath replacement last day 1/21  s/p HD removal and new right IJ tunn catheter was placed on 1/7        COVID-19 infection:  Continued on  covid protocal Received monupiravir, vit c and zinc Cta neg for PE               Hypoglycemia: Acute.  On admission glucose was noted to be as low as 65.   Improved        Right-sided chest pain:  CTA negative for PE            Atrial fibrillation: Patient was noted to be in atrial fibrillation with heart rates initially elevated into the 120s in route with EMS Currently rate controlled.  Not on any anticoagulation due to prior history of bleeding.    ESRD on HD:  HD Monday Wednesday Friday schedule           Essential hypertension:  Stable,      Elevated troponin:  Likely demand ischemia from sepsis Echo with EF 50 to 55% and no wall motion abnormality Will need to follow-up with primary care for possible cardiology referral since  CTA with coronary artery calcification, defer further management to PCP       COPD, without acute exacerbation Continue home management   Hypothyroidism:  TSH normal -Continue levothyroxine   Depression and anxiety -Continue Celexa   Chronic pain -Continue home medication regimen  Discharge Diagnoses:  Principal Problem:   Sepsis ([name]) Active Problems:   Acquired hypothyroidism   ESRD on hemodialysis ([name])   COPD (chronic obstructive pulmonary disease) ([place])   Anxiety   Do not resuscitate   COVID-19 virus infection   Infection of hemodialysis tunneled catheter ([name])   Elevated  troponin    Discharge Instructions  Discharge Instructions     Call MD for:  temperature >100.4   Complete by: As directed    Diet - low sodium heart healthy   Complete by: As directed    Discharge instructions   Complete by: As directed    F/u with dialysis as scheduled   If the dressing is still on your incision site when you go home, remove it on the third day after your surgery date. Remove dressing if it begins to fall off, or if it is dirty or damaged before the third day.   Complete by: As directed    Increase activity slowly   Complete by: As directed       Allergies as of 04/10/2021       Reactions   Amoxicillin Rash   Has patient had a PCN reaction causing immediate rash, facial/tongue/throat swelling, SOB or lightheadedness with hypotension:YES Has patient had a PCN reaction causing severe rash involving mucus membranes or skin necrosis: Yes Has patient had a PCN reaction that required hospitalization No Has patient had a PCN reaction occurring within the last 10 years: Yes If all of the above answers are "NO", then may proceed with Cephalosporin use. Cephalosporins ok   Elemental Sulfur Hives   Sulfur Hives   Peg 3350-electrolytes Other (See Comments)   Sulfa Drugs Cross Reactors Hives, Itching   Miralax [polyethylene Glycol] Itching   Mixed Grasses    Percocet [oxycodone-acetaminophen] Itching, Rash   Did not happen last time she took it   Sulfa Antibiotics Hives        Medication List     TAKE these medications    acetaminophen 325 MG tablet Commonly known as: TYLENOL Take 2 tablets (650 mg total) by mouth every 6 (six) hours as needed for mild pain, fever or headache. What changed:  medication strength how much to take when to take this reasons to take this   albuterol 108 (90 Base) MCG/ACT inhaler Commonly known as: VENTOLIN HFA Inhale 2 puffs into the lungs every 6 (six) hours as needed for wheezing or shortness of breath.   alendronate 70  MG tablet Commonly known as: FOSAMAX Take 1 tablet (70 mg total) by mouth every Thursday.   ascorbic acid 500 MG tablet Commonly known as: VITAMIN C Take 1 tablet (500 mg total) by mouth daily.   Auryxia 1 GM 210 MG(Fe) tablet Generic drug: ferric citrate Take 420 mg by mouth 3 (three) times daily with meals.   budesonide-formoterol 80-4.5 MCG/ACT inhaler Commonly known as: Symbicort Inhale 2 puffs into the lungs 2 (two) times daily.   cinacalcet 30 MG tablet Commonly known as: SENSIPAR Take 1 tablet (30 mg total) by mouth every Monday, Wednesday, and Friday.   citalopram 40 MG tablet Commonly known as: CELEXA TAKE 1 TABLET BY MOUTH EVERY DAY   docusate calcium 240 MG capsule Commonly  known as: SURFAK Take 1 capsule (240 mg total) by mouth daily.   doxercalciferol 4 MCG/2ML injection Commonly known as: HECTOROL Inject 0.5 mLs (1 mcg total) into the vein every Monday, Wednesday, and Friday with hemodialysis.   ezetimibe 10 MG tablet Commonly known as: ZETIA TAKE 1 TABLET BY MOUTH EVERY DAY   famotidine 20 MG tablet Commonly known as: PEPCID Take 1 tablet (20 mg total) by mouth 2 (two) times daily.   hydrALAZINE 10 MG tablet Commonly known as: APRESOLINE TAKE 1 TABLET BY MOUTH THREE TIMES A DAY   HYDROcodone-acetaminophen 7.5-325 MG tablet Commonly known as: Norco Take 1 tablet by mouth every 6 (six) hours as needed for moderate pain. What changed: Another medication with the same name was removed. Continue taking this medication, and follow the directions you see here.   levocetirizine 5 MG tablet Commonly known as: XYZAL TAKE 1 TABLET BY MOUTH EVERY DAY IN THE EVENING What changed:  how much to take how to take this   levothyroxine 125 MCG tablet Commonly known as: SYNTHROID TAKE 1 TABLET BY MOUTH EVERY DAY   multivitamin Tabs tablet Take 1 tablet by mouth daily.   mupirocin cream 2 % Commonly known as: Bactroban Apply 1 application topically 2 (two)  times daily.   mupirocin ointment 2 % Commonly known as: BACTROBAN Place into the nose 2 (two) times daily.   nystatin cream Commonly known as: MYCOSTATIN Apply 1 application topically 2 (two) times daily as needed for dry skin.   pantoprazole 20 MG tablet Commonly known as: PROTONIX Take 1 tablet (20 mg total) by mouth daily.   pramipexole 0.125 MG tablet Commonly known as: MIRAPEX Take 1 tablet (0.125 mg total) by mouth at bedtime.   zinc sulfate 220 (50 Zn) MG capsule Take 1 capsule (220 mg total) by mouth daily.               Discharge Care Instructions  (From admission, onward)           Start     Ordered   04/10/21 0000  If the dressing is still on your incision site when you go home, remove it on the third day after your surgery date. Remove dressing if it begins to fall off, or if it is dirty or damaged before the third day.        04/10/21 1309            Follow-up Information     Laurice Record, MD Follow up in 2 week(s).   Specialty: Infectious Diseases Contact information: [address], [place] [address] [phone]         [name], FNP Follow up in 1 week(s).   Specialty: Family Medicine Contact information: [place] [address] [phone]                Allergies  Allergen Reactions   Amoxicillin Rash    Has patient had a PCN reaction causing immediate rash, facial/tongue/throat swelling, SOB or lightheadedness with hypotension:YES Has patient had a PCN reaction causing severe rash involving mucus membranes or skin necrosis: Yes Has patient had a PCN reaction that required hospitalization No Has patient had a PCN reaction occurring within the last 10 years: Yes If all of the above answers are "NO", then may proceed with Cephalosporin use. Cephalosporins ok   Elemental Sulfur Hives   Sulfur Hives   Peg 3350-Electrolytes Other (See Comments)   Sulfa Drugs Cross  Reactors  Hives and Itching   Miralax [Polyethylene Glycol] Itching   Mixed Grasses    Percocet [Oxycodone-Acetaminophen] Itching and Rash    Did not happen last time she took it   Sulfa Antibiotics Hives    Consultations: ID, nephrology   Procedures/Studies: CT Angio Chest Pulmonary Embolism (PE) W or WO Contrast  Result Date: 04/05/2021 CLINICAL DATA:  Pulmonary embolism (PE) suspected, high prob. Chest pain, cough EXAM: CT ANGIOGRAPHY CHEST WITH CONTRAST TECHNIQUE: Multidetector CT imaging of the chest was performed using the standard protocol during bolus administration of intravenous contrast. Multiplanar CT image reconstructions and MIPs were obtained to evaluate the vascular anatomy. CONTRAST:  40mL OMNIPAQUE IOHEXOL 350 MG/ML SOLN COMPARISON:  05/22/2017 FINDINGS: Cardiovascular: There is adequate opacification of the pulmonary arterial tree. No intraluminal filling defect identified to suggest acute pulmonary embolism. The central pulmonary arteries are enlarged in keeping with pulmonary arterial hypertension. Extensive multi-vessel coronary artery calcification. Moderate cardiomegaly, stable since prior examination. No pericardial effusion. Moderate atherosclerotic calcification within the thoracic aorta. No aortic aneurysm. Left internal jugular hemodialysis catheter tip noted within the superior right atrium. Mediastinum/Nodes: Visualized thyroid is unremarkable. No pathologic thoracic adenopathy. Stable soft tissue nodule within the anterior mediastinum measuring 18 x 23 mm this is nonspecific, but may represent a primary mass such as a thymoma or pathologically enlarged lymph node. The esophagus is unremarkable. Lungs/Pleura: There is dense consolidation of the posterior basal segment of the right lower lobe with extensive airway impaction in keeping with changes of acute lobar pneumonia or aspiration in the appropriate clinical setting. The lungs are otherwise clear. No pneumothorax or pleural  effusion. Central airways are widely patent. Upper Abdomen: No acute abnormality. Musculoskeletal: No acute bone abnormality. Degenerative changes are seen within the thoracic spine. Bilateral total shoulder arthroplasty is partially visualized. Review of the MIP images confirms the above findings. IMPRESSION: No pulmonary embolism. Dense consolidation of the posterior basal right lower lobe in keeping with acute lobar pneumonia or aspiration. No central obstructing lesion. Extensive coronary artery calcification. Stable moderate cardiomegaly. Stable 23 mm soft tissue nodule within the anterior mediastinum. Differential considerations are led by thymoma or pathologic lymph node. This is unchanged in size and configuration, however, since remote prior examination of 05/22/2017. Aortic Atherosclerosis (ICD10-I70.0). Electronically Signed   By: Fidela Salisbury M.D.   On: 04/05/2021 22:52   IR Fluoro Guide CV Line Right  Result Date: 04/10/2021 INDICATION: End-stage renal disease, concern for catheter in infection, sepsis EXAM: ULTRASOUND GUIDANCE FOR VASCULAR ACCESS RIGHT INTERNAL JUGULAR PERMANENT HEMODIALYSIS CATHETER Date:  04/10/2021 04/10/2021 11:23 am Radiologist:  M. Daryll Brod, MD Guidance:  ULTRASOUND AND FLUOROSCOPIC FLUOROSCOPY TIME:  Fluoroscopy Time: 1 minutes 6 seconds (3 mGy). MEDICATIONS: 1% lidocaine local ANESTHESIA/SEDATION: Versed 0.5 mg IV; Fentanyl 25 mcg IV; Moderate Sedation Time:  20 minute The patient was continuously monitored during the procedure by the interventional radiology nurse under my direct supervision. CONTRAST:  None. COMPLICATIONS: None immediate. PROCEDURE: Informed consent was obtained from the patient following explanation of the procedure, risks, benefits and alternatives. The patient understands, agrees and consents for the procedure. All questions were addressed. A time out was performed. Maximal barrier sterile technique utilized including caps, mask, sterile gowns,  sterile gloves, large sterile drape, hand hygiene, and 2% chlorhexidine scrub. Under sterile conditions and local anesthesia, right internal jugular micropuncture venous access was performed with ultrasound. Images were obtained for documentation of the patent right internal jugular vein. A guide wire was inserted followed by a  transitional dilator. Next, a 0.035 guidewire was advanced into the IVC with a 5-French catheter. Measurements were obtained from the right venotomy site to the proximal right atrium. In the right infraclavicular chest, a subcutaneous tunnel was created under sterile conditions and local anesthesia. 1% lidocaine with epinephrine was utilized for this. The 19 cm tip to cuff palindrome catheter was tunneled subcutaneously to the venotomy site and inserted into the SVC/RA junction through a valved peel-away sheath. Position was confirmed with fluoroscopy. Images were obtained for documentation. Blood was aspirated from the catheter followed by saline and heparin flushes. The appropriate volume and strength of heparin was instilled in each lumen. Caps were applied. The catheter was secured at the tunnel site with Gelfoam and a pursestring suture. The venotomy site was closed with subcuticular Vicryl suture. Dermabond was applied to the small right neck incision. A dry sterile dressing was applied. The catheter is ready for use. No immediate complications. IMPRESSION: Ultrasound and fluoroscopically guided right internal jugular tunneled hemodialysis catheter (19 cm tip to cuff palindrome catheter). Electronically Signed   By: Jerilynn Mages.  Shick M.D.   On: 04/10/2021 11:47   IR Removal Tun Cv Cath W/O FL  Result Date: 04/10/2021 INDICATION: Catheter infection, sepsis EXAM: LEFT INTERNAL JUGULAR TUNNELED HEMODIAYLISIS CATHER REMOVAL Date:  04/10/2021 04/10/2021 11:23 am Radiologist:  M. Daryll Brod, MD MEDICATIONS: patient is already receiving iv antibiotics as an inpatient. The antibiotic was given in an  appropriate time interval prior to skin puncture. ANESTHESIA/SEDATION: Versed 0.5 mg IV; Fentanyl 25 mcg IV; Moderate Sedation Time:  20 minute The patient was continuously monitored during the procedure by the interventional radiology nurse under my direct supervision. FLUOROSCOPY TIME:  Fluoroscopy Time: None. COMPLICATIONS: None immediate. PROCEDURE: Informed consent was obtained from the patient following explanation of the procedure, risks, benefits and alternatives. The patient understands, agrees and consents for the procedure. All questions were addressed. A time out was performed. Maximal barrier sterile technique utilized including caps, mask, sterile gowns, sterile gloves, large sterile drape, hand hygiene, and ChloraPrep. 1% lidocaine was injected under sterile conditions along the subcutaneous tunnel. The left internal jugulartunneled catheter was removed utilizing firm retraction to release the retention cuff. The catheter was removed entirely. Hemostasis was obtained with compression. No immediate complication. Sterile dressing applied. The patient tolerated the procedure well. IMPRESSION: Successful left internal jugulartunnel dialysis catheter removal. Electronically Signed   By: Jerilynn Mages.  Shick M.D.   On: 04/10/2021 11:49   IR US Guide Vasc Access Right  Result Date: 04/10/2021 INDICATION: End-stage renal disease, concern for catheter in infection, sepsis EXAM: ULTRASOUND GUIDANCE FOR VASCULAR ACCESS RIGHT INTERNAL JUGULAR PERMANENT HEMODIALYSIS CATHETER Date:  04/10/2021 04/10/2021 11:23 am Radiologist:  M. Daryll Brod, MD Guidance:  ULTRASOUND AND FLUOROSCOPIC FLUOROSCOPY TIME:  Fluoroscopy Time: 1 minutes 6 seconds (3 mGy). MEDICATIONS: 1% lidocaine local ANESTHESIA/SEDATION: Versed 0.5 mg IV; Fentanyl 25 mcg IV; Moderate Sedation Time:  20 minute The patient was continuously monitored during the procedure by the interventional radiology nurse under my direct supervision. CONTRAST:  None.  COMPLICATIONS: None immediate. PROCEDURE: Informed consent was obtained from the patient following explanation of the procedure, risks, benefits and alternatives. The patient understands, agrees and consents for the procedure. All questions were addressed. A time out was performed. Maximal barrier sterile technique utilized including caps, mask, sterile gowns, sterile gloves, large sterile drape, hand hygiene, and 2% chlorhexidine scrub. Under sterile conditions and local anesthesia, right internal jugular micropuncture venous access was performed with ultrasound. Images were obtained  for documentation of the patent right internal jugular vein. A guide wire was inserted followed by a transitional dilator. Next, a 0.035 guidewire was advanced into the IVC with a 5-French catheter. Measurements were obtained from the right venotomy site to the proximal right atrium. In the right infraclavicular chest, a subcutaneous tunnel was created under sterile conditions and local anesthesia. 1% lidocaine with epinephrine was utilized for this. The 19 cm tip to cuff palindrome catheter was tunneled subcutaneously to the venotomy site and inserted into the SVC/RA junction through a valved peel-away sheath. Position was confirmed with fluoroscopy. Images were obtained for documentation. Blood was aspirated from the catheter followed by saline and heparin flushes. The appropriate volume and strength of heparin was instilled in each lumen. Caps were applied. The catheter was secured at the tunnel site with Gelfoam and a pursestring suture. The venotomy site was closed with subcuticular Vicryl suture. Dermabond was applied to the small right neck incision. A dry sterile dressing was applied. The catheter is ready for use. No immediate complications. IMPRESSION: Ultrasound and fluoroscopically guided right internal jugular tunneled hemodialysis catheter (19 cm tip to cuff palindrome catheter). Electronically Signed   By: Jerilynn Mages.  Shick M.D.    On: 04/10/2021 11:47   DG Chest Portable 1 View  Result Date: 04/05/2021 CLINICAL DATA:  Cough EXAM: PORTABLE CHEST 1 VIEW COMPARISON:  Chest radiograph dated Aug 14, 2017 and September 06, 2018 FINDINGS: The heart size and mediastinal contours are within normal limits. Aorta is tortuous with atherosclerotic calcifications. Left access dialysis catheter with distal tip at the cavoatrial junction, unchanged. Both lungs are clear. Bilateral shoulder arthroplasty. IMPRESSION: No acute cardiopulmonary process. Electronically Signed   By: Keane Police D.O.   On: 04/05/2021 09:28   ECHOCARDIOGRAM COMPLETE  Result Date: 04/07/2021    ECHOCARDIOGRAM REPORT   Patient Name:   LAURYNN MCCORVEY Date of Exam: 04/07/2021 Medical Rec #:  [id number]   Height:       65.0 in Accession #:    [id number]  Weight:       119.0 lb Date of Birth:  03-06-1941   BSA:          1.587 m Patient Age:    4 years    BP:           104/64 mmHg Patient Gender: F           HR:           72 bpm. Exam Location:  Inpatient Procedure: 2D Echo, Cardiac Doppler and Color Doppler Indications:    Elevated Troponin  History:        Patient has prior history of Echocardiogram examinations, most                 recent 05/01/2018. Risk Factors:Family History of Coronary Artery                 Disease, Hypertension, Diabetes and Dyslipidemia. Covid 19                 positive.  Sonographer:    Merrie Roof RDCS Referring Phys: [id number] [place]  1. Left ventricular ejection fraction, by estimation, is 50 to 55%. The left ventricle has low normal function. The left ventricle has no regional wall motion abnormalities. Left ventricular diastolic function could not be evaluated.  2. Right ventricular systolic function is normal. The right ventricular size is normal.  3. Left atrial size was severely dilated.  4. Right atrial  size was moderately dilated.  5. The mitral valve is normal in structure. Trivial mitral valve regurgitation. No evidence of mitral  stenosis.  6. The aortic valve is tricuspid. Aortic valve regurgitation is not visualized. Aortic valve sclerosis is present, with no evidence of aortic valve stenosis.  7. Aortic dilatation noted. There is borderline dilatation of the ascending aorta, measuring 39 mm.  8. The inferior vena cava is normal in size with greater than 50% respiratory variability, suggesting right atrial pressure of 3 mmHg. FINDINGS  Left Ventricle: Left ventricular ejection fraction, by estimation, is 50 to 55%. The left ventricle has low normal function. The left ventricle has no regional wall motion abnormalities. The left ventricular internal cavity size was normal in size. There is no left ventricular hypertrophy. Left ventricular diastolic function could not be evaluated due to atrial fibrillation. Left ventricular diastolic function could not be evaluated. Right Ventricle: The right ventricular size is normal. Right ventricular systolic function is normal. Left Atrium: Left atrial size was severely dilated. Right Atrium: Right atrial size was moderately dilated. Pericardium: There is no evidence of pericardial effusion. Mitral Valve: The mitral valve is normal in structure. Mild mitral annular calcification. Trivial mitral valve regurgitation. No evidence of mitral valve stenosis. Tricuspid Valve: The tricuspid valve is normal in structure. Tricuspid valve regurgitation is mild . No evidence of tricuspid stenosis. Aortic Valve: The aortic valve is tricuspid. Aortic valve regurgitation is not visualized. Aortic valve sclerosis is present, with no evidence of aortic valve stenosis. Pulmonic Valve: The pulmonic valve was normal in structure. Pulmonic valve regurgitation is not visualized. No evidence of pulmonic stenosis. Aorta: Aortic dilatation noted. There is borderline dilatation of the ascending aorta, measuring 39 mm. Venous: The inferior vena cava is normal in size with greater than 50% respiratory variability, suggesting  right atrial pressure of 3 mmHg. IAS/Shunts: No atrial level shunt detected by color flow Doppler.  LEFT ATRIUM              Index        RIGHT ATRIUM           Index LA Vol (A2C):   120.0 ml 75.63 ml/m  RA Area:     21.60 cm LA Vol (A4C):   90.9 ml  57.29 ml/m  RA Volume:   64.70 ml  40.78 ml/m LA Biplane Vol: 109.0 ml 68.70 ml/m  TRICUSPID VALVE TR Peak grad:   26.4 mmHg TR Vmax:        257.00 cm/s Kirk Ruths MD Electronically signed by Kirk Ruths MD Signature Date/Time: 04/07/2021/4:04:20 PM    Final       Subjective:   Discharge Exam: Vitals:   04/10/21 1230 04/10/21 1300  BP: 120/77 118/88  Pulse: 85 96  Resp: 20 19  Temp:    SpO2:     Vitals:   04/10/21 1130 04/10/21 1200 04/10/21 1230 04/10/21 1300  BP: 107/77 114/76 120/77 118/88  Pulse: 77 85 85 96  Resp: 16 18 20 19   Temp:      TempSrc:      SpO2:      Weight:      Height:        General: Pt is alert, awake, not in acute distress, and dialysis Cardiovascular: RRR, S1/S2 +, no rubs, no gallops Respiratory: CTA bilaterally, no wheezing, no rhonchi Abdominal: Soft, NT, ND, bowel sounds + Extremities: no edema, no cyanosis    The results of significant diagnostics from this hospitalization (including imaging,  microbiology, ancillary and laboratory) are listed below for reference.     Microbiology: Recent Results (from the past 240 hour(s))  Culture, blood (routine x 2)     Status: None   Collection Time: 04/05/21  9:18 AM   Specimen: Right Antecubital; Blood  Result Value Ref Range Status   Specimen Description RIGHT ANTECUBITAL  Final   Special Requests   Final    BOTTLES DRAWN AEROBIC AND ANAEROBIC Blood Culture results may not be optimal due to an inadequate volume of blood received in culture bottles   Culture   Final    NO GROWTH 5 DAYS Performed at [facility], [place] [address], [place], [place] [address]    Report Status 04/10/2021 FINAL  Final  Resp Panel by RT-PCR (Flu A&B, Covid)  Nasopharyngeal Swab     Status: Abnormal   Collection Time: 04/05/21  9:18 AM   Specimen: Nasopharyngeal Swab; Nasopharyngeal(NP) swabs in vial transport medium  Result Value Ref Range Status   SARS Coronavirus 2 by RT PCR POSITIVE (A) NEGATIVE Final    Comment: (NOTE) SARS-CoV-2 target nucleic acids are DETECTED.  The SARS-CoV-2 RNA is generally detectable in upper respiratory specimens during the acute phase of infection. Positive results are indicative of the presence of the identified virus, but do not rule out bacterial infection or co-infection with other pathogens not detected by the test. Clinical correlation with patient history and other diagnostic information is necessary to determine patient infection status. The expected result is Negative.  Fact Sheet for Patients: EntrepreneurPulse.com.au  Fact Sheet for Healthcare Providers: IncredibleEmployment.be  This test is not yet approved or cleared by the [place] FDA and  has been authorized for detection and/or diagnosis of SARS-CoV-2 by FDA under an Emergency Use Authorization (EUA).  This EUA will remain in effect (meaning this test can be used) for the duration of  the COVID-19 declaration under Section 564(b)(1) of the A ct, 21 U.S.C. section 360bbb-3(b)(1), unless the authorization is terminated or revoked sooner.     Influenza A by PCR NEGATIVE NEGATIVE Final   Influenza B by PCR NEGATIVE NEGATIVE Final    Comment: (NOTE) The Xpert Xpress SARS-CoV-2/FLU/RSV plus assay is intended as an aid in the diagnosis of influenza from Nasopharyngeal swab specimens and should not be used as a sole basis for treatment. Nasal washings and aspirates are unacceptable for Xpert Xpress SARS-CoV-2/FLU/RSV testing.  Fact Sheet for Patients: EntrepreneurPulse.com.au  Fact Sheet for Healthcare Providers: IncredibleEmployment.be  This test is not yet  approved or cleared by the [place] FDA and has been authorized for detection and/or diagnosis of SARS-CoV-2 by FDA under an Emergency Use Authorization (EUA). This EUA will remain in effect (meaning this test can be used) for the duration of the COVID-19 declaration under Section 564(b)(1) of the Act, 21 U.S.C. section 360bbb-3(b)(1), unless the authorization is terminated or revoked.  Performed at [facility], [place] [address], [place], [place] [address]   Culture, blood (routine x 2)     Status: None   Collection Time: 04/05/21 10:22 AM   Specimen: BLOOD RIGHT WRIST  Result Value Ref Range Status   Specimen Description BLOOD RIGHT WRIST  Final   Special Requests   Final    BOTTLES DRAWN AEROBIC ONLY Blood Culture results may not be optimal due to an inadequate volume of blood received in culture bottles   Culture   Final    NO GROWTH 5 DAYS Performed at [facility], [address] [place], [place] [address]    Report Status 04/10/2021 FINAL  Final  MRSA Next Gen by PCR, Nasal     Status: Abnormal   Collection Time: 04/06/21 10:50 AM   Specimen: Nasal Mucosa; Nasal Swab  Result Value Ref Range Status   MRSA by PCR Next Gen DETECTED (A) NOT DETECTED Final    Comment: RESULT CALLED TO, READ BACK BY AND VERIFIED WITH: RN R.AGUSTIS ON [id number] AT 2 BY E.PARRISH (NOTE) The GeneXpert MRSA Assay (FDA approved for NASAL specimens only), is one component of a comprehensive MRSA colonization surveillance program. It is not intended to diagnose MRSA infection nor to guide or monitor treatment for MRSA infections. Test performance is not FDA approved in patients less than 7 years old. Performed at [facility], [place] [address] [place], [place] [address]      Labs: BNP (last 3 results) Recent Labs    04/05/21 1350  BNP 3,546.5*   Basic Metabolic Panel: Recent Labs  Lab 04/06/21 0202 04/07/21 0550 04/08/21 0306 04/09/21 0235 04/10/21 0429  NA  135 137 133* 136 135  K 4.9 4.5 3.4* 3.5 3.7  CL 102 103 101 99 101  CO2 21* 23 22 25 23   GLUCOSE 90 67* 77 81 79  BUN 48* 64* 29* 40* 48*  CREATININE 6.03* 7.46* 4.84* 6.32* 7.67*  CALCIUM 7.7* 8.0* 7.4* 8.0* 8.4*  MG 2.0 2.2 1.9 2.1 2.2  PHOS 3.3 3.5 2.5 3.0 3.4   Liver Function Tests: Recent Labs  Lab 04/06/21 0202 04/07/21 0550 04/08/21 0306 04/09/21 0235 04/10/21 0429  AST 34 21 24 20 19   ALT 18 18 17 19 15   ALKPHOS 69 84 79 70 80  BILITOT 0.8 0.7 0.6 0.5 0.7  PROT 5.2* 5.3* 5.5* 5.3* 5.5*  ALBUMIN 2.2* 2.2* 2.3* 2.2* 2.3*   No results for input(s): LIPASE, AMYLASE in the last 168 hours. No results for input(s): AMMONIA in the last 168 hours. CBC: Recent Labs  Lab 04/06/21 0202 04/07/21 0550 04/08/21 0306 04/09/21 0235 04/10/21 0429  WBC 24.0* 17.3* 13.1* 11.0* 10.1  NEUTROABS 21.8* 14.9* 10.8* 8.8* 7.3  HGB 10.7* 10.1* 11.0* 11.0* 11.2*  HCT 33.2* 32.1* 33.7* 33.7* 35.1*  MCV 96.2 96.4 95.2 94.9 95.1  PLT 205 237 253 269 305   Cardiac Enzymes: No results for input(s): CKTOTAL, CKMB, CKMBINDEX, TROPONINI in the last 168 hours. BNP: Invalid input(s): POCBNP CBG: Recent Labs  Lab 04/09/21 0431 04/09/21 0819 04/09/21 2300 04/10/21 0346 04/10/21 0655  GLUCAP 74 78 81 73 70   D-Dimer No results for input(s): DDIMER in the last 72 hours. Hgb A1c No results for input(s): HGBA1C in the last 72 hours. Lipid Profile No results for input(s): CHOL, HDL, LDLCALC, TRIG, CHOLHDL, LDLDIRECT in the last 72 hours. Thyroid function studies No results for input(s): TSH, T4TOTAL, T3FREE, THYROIDAB in the last 72 hours.  Invalid input(s): FREET3 Anemia work up No results for input(s): VITAMINB12, FOLATE, FERRITIN, TIBC, IRON, RETICCTPCT in the last 72 hours. Urinalysis    Component Value Date/Time   COLORURINE YELLOW 07/26/2013 1202   APPEARANCEUR Cloudy (A) 05/20/2020 1128   LABSPEC 1.016 07/26/2013 1202   PHURINE 7.0 07/26/2013 1202   GLUCOSEU Negative  05/20/2020 1128   HGBUR SMALL (A) 07/26/2013 1202   BILIRUBINUR Negative 05/20/2020 1128   KETONESUR NEGATIVE 07/26/2013 1202   PROTEINUR 3+ (A) 05/20/2020 1128   PROTEINUR 100 (A) 07/26/2013 1202   UROBILINOGEN 1.0 07/26/2013 1202  NITRITE Negative 05/20/2020 1128   NITRITE NEGATIVE 07/26/2013 1202   LEUKOCYTESUR 3+ (A) 05/20/2020 1128   Sepsis Labs Invalid input(s): PROCALCITONIN,  WBC,  LACTICIDVEN Microbiology Recent Results (from the past 240 hour(s))  Culture, blood (routine x 2)     Status: None   Collection Time: 04/05/21  9:18 AM   Specimen: Right Antecubital; Blood  Result Value Ref Range Status   Specimen Description RIGHT ANTECUBITAL  Final   Special Requests   Final    BOTTLES DRAWN AEROBIC AND ANAEROBIC Blood Culture results may not be optimal due to an inadequate volume of blood received in culture bottles   Culture   Final    NO GROWTH 5 DAYS Performed at [facility], [place] [address], [place], [place] [address]    Report Status 04/10/2021 FINAL  Final  Resp Panel by RT-PCR (Flu A&B, Covid) Nasopharyngeal Swab     Status: Abnormal   Collection Time: 04/05/21  9:18 AM   Specimen: Nasopharyngeal Swab; Nasopharyngeal(NP) swabs in vial transport medium  Result Value Ref Range Status   SARS Coronavirus 2 by RT PCR POSITIVE (A) NEGATIVE Final    Comment: (NOTE) SARS-CoV-2 target nucleic acids are DETECTED.  The SARS-CoV-2 RNA is generally detectable in upper respiratory specimens during the acute phase of infection. Positive results are indicative of the presence of the identified virus, but do not rule out bacterial infection or co-infection with other pathogens not detected by the test. Clinical correlation with patient history and other diagnostic information is necessary to determine patient infection status. The expected result is Negative.  Fact Sheet for Patients: EntrepreneurPulse.com.au  Fact Sheet for Healthcare  Providers: IncredibleEmployment.be  This test is not yet approved or cleared by the [place] FDA and  has been authorized for detection and/or diagnosis of SARS-CoV-2 by FDA under an Emergency Use Authorization (EUA).  This EUA will remain in effect (meaning this test can be used) for the duration of  the COVID-19 declaration under Section 564(b)(1) of the A ct, 21 U.S.C. section 360bbb-3(b)(1), unless the authorization is terminated or revoked sooner.     Influenza A by PCR NEGATIVE NEGATIVE Final   Influenza B by PCR NEGATIVE NEGATIVE Final    Comment: (NOTE) The Xpert Xpress SARS-CoV-2/FLU/RSV plus assay is intended as an aid in the diagnosis of influenza from Nasopharyngeal swab specimens and should not be used as a sole basis for treatment. Nasal washings and aspirates are unacceptable for Xpert Xpress SARS-CoV-2/FLU/RSV testing.  Fact Sheet for Patients: EntrepreneurPulse.com.au  Fact Sheet for Healthcare Providers: IncredibleEmployment.be  This test is not yet approved or cleared by the [place] FDA and has been authorized for detection and/or diagnosis of SARS-CoV-2 by FDA under an Emergency Use Authorization (EUA). This EUA will remain in effect (meaning this test can be used) for the duration of the COVID-19 declaration under Section 564(b)(1) of the Act, 21 U.S.C. section 360bbb-3(b)(1), unless the authorization is terminated or revoked.  Performed at [facility], [place] [address] [place], [place] [address]   Culture, blood (routine x 2)     Status: None   Collection Time: 04/05/21 10:22 AM   Specimen: BLOOD RIGHT WRIST  Result Value Ref Range Status   Specimen Description BLOOD RIGHT WRIST  Final   Special Requests   Final    BOTTLES DRAWN AEROBIC ONLY Blood Culture results may not be optimal due to an inadequate volume of blood received in culture bottles   Culture  Final    NO GROWTH 5  DAYS Performed at [facility], [place] [address] [place], [place] [address]    Report Status 04/10/2021 FINAL  Final  MRSA Next Gen by PCR, Nasal     Status: Abnormal   Collection Time: 04/06/21 10:50 AM   Specimen: Nasal Mucosa; Nasal Swab  Result Value Ref Range Status   MRSA by PCR Next Gen DETECTED (A) NOT DETECTED Final    Comment: RESULT CALLED TO, READ BACK BY AND VERIFIED WITH: RN R.AGUSTIS ON [id number] AT 87 BY E.PARRISH (NOTE) The GeneXpert MRSA Assay (FDA approved for NASAL specimens only), is one component of a comprehensive MRSA colonization surveillance program. It is not intended to diagnose MRSA infection nor to guide or monitor treatment for MRSA infections. Test performance is not FDA approved in patients less than 72 years old. Performed at [facility], [place] [address] [place], [place] [address]      Time coordinating discharge: Over 30 minutes  SIGNED:   Nolberto Hanlon, MD  [facility] 04/10/2021, 1:22 PM Pager   If 7PM-7AM, please contact night-coverage [email or web address] Password [email or web address] disch

## 2021-04-12 ENCOUNTER — Telehealth: Payer: Self-pay | Admitting: Nephrology

## 2021-04-12 ENCOUNTER — Telehealth: Payer: Self-pay | Admitting: Family Medicine

## 2021-04-12 DIAGNOSIS — D689 Coagulation defect, unspecified: Secondary | ICD-10-CM | POA: Diagnosis not present

## 2021-04-12 DIAGNOSIS — E1121 Type 2 diabetes mellitus with diabetic nephropathy: Secondary | ICD-10-CM | POA: Diagnosis not present

## 2021-04-12 DIAGNOSIS — N185 Chronic kidney disease, stage 5: Secondary | ICD-10-CM | POA: Diagnosis not present

## 2021-04-12 DIAGNOSIS — R519 Headache, unspecified: Secondary | ICD-10-CM | POA: Diagnosis not present

## 2021-04-12 DIAGNOSIS — Z4901 Encounter for fitting and adjustment of extracorporeal dialysis catheter: Secondary | ICD-10-CM | POA: Diagnosis not present

## 2021-04-12 DIAGNOSIS — A499 Bacterial infection, unspecified: Secondary | ICD-10-CM | POA: Diagnosis not present

## 2021-04-12 DIAGNOSIS — N2581 Secondary hyperparathyroidism of renal origin: Secondary | ICD-10-CM | POA: Diagnosis not present

## 2021-04-12 DIAGNOSIS — Z992 Dependence on renal dialysis: Secondary | ICD-10-CM | POA: Diagnosis not present

## 2021-04-12 DIAGNOSIS — N186 End stage renal disease: Secondary | ICD-10-CM | POA: Diagnosis not present

## 2021-04-12 NOTE — Telephone Encounter (Signed)
Transition of Care Contact from [place]   Date of Discharge: 04/10/2021 Date of Contact: 04/12/2021 Method of contact: phone Talked to patient   Patient contacted to discuss transition of care form recent hospitaliztion. Patient was admitted to [facility] [place] from 04/05/21 to 04/10/21 with the discharge diagnosis of sepsis 2/2 catheter infection.  Medication changes were reviewed - stop alendronate.  Patient will follow up with is outpatient dialysis center today 04/12/21.  Other follow up needs include none identified.    Jen Mow, PA-C [facility] Pager: [phone]

## 2021-04-12 NOTE — Telephone Encounter (Signed)
Appt made, daughter aware

## 2021-04-13 DIAGNOSIS — N185 Chronic kidney disease, stage 5: Secondary | ICD-10-CM | POA: Diagnosis not present

## 2021-04-13 DIAGNOSIS — E1121 Type 2 diabetes mellitus with diabetic nephropathy: Secondary | ICD-10-CM | POA: Diagnosis not present

## 2021-04-13 DIAGNOSIS — D689 Coagulation defect, unspecified: Secondary | ICD-10-CM | POA: Diagnosis not present

## 2021-04-13 DIAGNOSIS — N186 End stage renal disease: Secondary | ICD-10-CM | POA: Diagnosis not present

## 2021-04-13 DIAGNOSIS — Z4901 Encounter for fitting and adjustment of extracorporeal dialysis catheter: Secondary | ICD-10-CM | POA: Diagnosis not present

## 2021-04-13 DIAGNOSIS — A499 Bacterial infection, unspecified: Secondary | ICD-10-CM | POA: Diagnosis not present

## 2021-04-13 DIAGNOSIS — Z992 Dependence on renal dialysis: Secondary | ICD-10-CM | POA: Diagnosis not present

## 2021-04-13 DIAGNOSIS — N2581 Secondary hyperparathyroidism of renal origin: Secondary | ICD-10-CM | POA: Diagnosis not present

## 2021-04-13 DIAGNOSIS — R519 Headache, unspecified: Secondary | ICD-10-CM | POA: Diagnosis not present

## 2021-04-14 DIAGNOSIS — A499 Bacterial infection, unspecified: Secondary | ICD-10-CM | POA: Diagnosis not present

## 2021-04-14 DIAGNOSIS — N2581 Secondary hyperparathyroidism of renal origin: Secondary | ICD-10-CM | POA: Diagnosis not present

## 2021-04-14 DIAGNOSIS — N185 Chronic kidney disease, stage 5: Secondary | ICD-10-CM | POA: Diagnosis not present

## 2021-04-14 DIAGNOSIS — Z4901 Encounter for fitting and adjustment of extracorporeal dialysis catheter: Secondary | ICD-10-CM | POA: Diagnosis not present

## 2021-04-14 DIAGNOSIS — E1121 Type 2 diabetes mellitus with diabetic nephropathy: Secondary | ICD-10-CM | POA: Diagnosis not present

## 2021-04-14 DIAGNOSIS — D689 Coagulation defect, unspecified: Secondary | ICD-10-CM | POA: Diagnosis not present

## 2021-04-14 DIAGNOSIS — Z992 Dependence on renal dialysis: Secondary | ICD-10-CM | POA: Diagnosis not present

## 2021-04-14 DIAGNOSIS — R519 Headache, unspecified: Secondary | ICD-10-CM | POA: Diagnosis not present

## 2021-04-14 DIAGNOSIS — N186 End stage renal disease: Secondary | ICD-10-CM | POA: Diagnosis not present

## 2021-04-15 ENCOUNTER — Encounter: Payer: Self-pay | Admitting: Family Medicine

## 2021-04-15 ENCOUNTER — Encounter (HOSPITAL_COMMUNITY): Payer: Self-pay | Admitting: *Deleted

## 2021-04-15 ENCOUNTER — Ambulatory Visit (INDEPENDENT_AMBULATORY_CARE_PROVIDER_SITE_OTHER): Payer: Medicare Other | Admitting: Family Medicine

## 2021-04-15 VITALS — BP 140/85 | HR 86 | Temp 98.2°F | Ht 65.0 in | Wt 113.5 lb

## 2021-04-15 DIAGNOSIS — Z8619 Personal history of other infectious and parasitic diseases: Secondary | ICD-10-CM

## 2021-04-15 DIAGNOSIS — A419 Sepsis, unspecified organism: Secondary | ICD-10-CM

## 2021-04-15 DIAGNOSIS — Z09 Encounter for follow-up examination after completed treatment for conditions other than malignant neoplasm: Secondary | ICD-10-CM

## 2021-04-15 DIAGNOSIS — K219 Gastro-esophageal reflux disease without esophagitis: Secondary | ICD-10-CM

## 2021-04-15 DIAGNOSIS — M810 Age-related osteoporosis without current pathological fracture: Secondary | ICD-10-CM | POA: Diagnosis not present

## 2021-04-15 MED ORDER — FAMOTIDINE 10 MG PO TABS: 10.0000 mg | ORAL_TABLET | Freq: Every day | ORAL | 1 refills | Status: DC

## 2021-04-15 MED ORDER — DOCUSATE CALCIUM 240 MG PO CAPS: 240.0000 mg | ORAL_CAPSULE | Freq: Every day | ORAL | 1 refills | Status: DC

## 2021-04-15 MED ORDER — ALENDRONATE SODIUM 70 MG PO TABS: 70.0000 mg | ORAL_TABLET | ORAL | 3 refills | Status: DC

## 2021-04-15 MED ORDER — PANTOPRAZOLE SODIUM 20 MG PO TBEC: 20.0000 mg | DELAYED_RELEASE_TABLET | Freq: Every day | ORAL | 1 refills | Status: DC

## 2021-04-15 NOTE — Progress Notes (Addendum)
Assessment & Plan:  1. Sepsis Improving on IV vancomycin, labs to assess. Patient to keep follow-up appointment with infectious disease. - BMP8+EGFR - CBC with Differential/Platelet - Brain natriuretic peptide  2. Hospital discharge follow-up   Follow up plan: Return in about 6 weeks (around 05/26/2021) for follow-up of chronic medication conditions.  Lucile Crater, NP Student   I personally was present during the history, physical exam, and medical decision-making activities of this service and have verified that the service and findings are accurately documented in the nurse practitioner student's note.  [name], MSN, APRN, FNP-C [facility]  Subjective:   Patient ID: [name], female    DOB: [birth year], 81 y.o.   MRN: [id number]  HPI: [name] is a 81 y.o. female presenting on 04/15/2021 for Hospitalization Follow-up ([facility] 04/05/21- sepsis)  Patient is accompanied by her daughter-in-law, who she is okay with being present.  She is here today for a hospital follow up . She was admitted to [facility] 04/05/21-04/10/21 with sepsis from an infected HD catheter and COVID-19. While hospitalized they replaced her tunneled HD catheter. She has completed a course of monupiravir, vitamin C, and zinc. She is to follow up with ID after completing 14 days of vancomycin for the infected HD catheter, which will be completed 1/21. Her appointment with them is on 04/28/21. She reports she is doing better but is still very fatigued.    ROS: Negative unless specifically indicated above in HPI.   Relevant past medical history reviewed and updated as indicated.   Allergies and medications reviewed and updated.   Current Outpatient Medications:    acetaminophen (TYLENOL) 325 MG tablet, Take 2 tablets (650 mg total) by mouth every 6 (six) hours as needed for mild pain, fever or headache., Disp: , Rfl:    albuterol (VENTOLIN HFA) 108 (90 Base) MCG/ACT inhaler, Inhale 2  puffs into the lungs every 6 (six) hours as needed for wheezing or shortness of breath., Disp: 18 g, Rfl: 2   ascorbic acid (VITAMIN C) 500 MG tablet, Take 1 tablet (500 mg total) by mouth daily., Disp: 30 tablet, Rfl: 0   AURYXIA 1 GM 210 MG(Fe) tablet, Take 420 mg by mouth 3 (three) times daily with meals., Disp: , Rfl:    budesonide-formoterol (SYMBICORT) 80-4.5 MCG/ACT inhaler, Inhale 2 puffs into the lungs 2 (two) times daily., Disp: 3 each, Rfl: 4   cinacalcet (SENSIPAR) 30 MG tablet, Take 1 tablet (30 mg total) by mouth every Monday, Wednesday, and Friday., Disp: 60 tablet, Rfl: 0   citalopram (CELEXA) 40 MG tablet, TAKE 1 TABLET BY MOUTH EVERY DAY (Patient taking differently: Take 40 mg by mouth daily.), Disp: 90 tablet, Rfl: 0   doxercalciferol (HECTOROL) 4 MCG/2ML injection, Inject 0.5 mLs (1 mcg total) into the vein every Monday, Wednesday, and Friday with hemodialysis., Disp: 2 mL, Rfl: 0   ezetimibe (ZETIA) 10 MG tablet, TAKE 1 TABLET BY MOUTH EVERY DAY, Disp: 90 tablet, Rfl: 3   hydrALAZINE (APRESOLINE) 10 MG tablet, TAKE 1 TABLET BY MOUTH THREE TIMES A DAY, Disp: 270 tablet, Rfl: 0   HYDROcodone-acetaminophen (NORCO) 7.5-325 MG tablet, Take 1 tablet by mouth every 6 (six) hours as needed for moderate pain., Disp: 90 tablet, Rfl: 0   levocetirizine (XYZAL) 5 MG tablet, TAKE 1 TABLET BY MOUTH EVERY DAY IN THE EVENING, Disp: 90 tablet, Rfl: 1   levothyroxine (SYNTHROID) 125 MCG tablet, TAKE 1 TABLET BY MOUTH EVERY DAY, Disp:  90 tablet, Rfl: 0   multivitamin (RENA-VIT) TABS tablet, Take 1 tablet by mouth daily., Disp: , Rfl:    nystatin cream (MYCOSTATIN), Apply 1 application topically 2 (two) times daily as needed for dry skin., Disp: 30 g, Rfl: 2   pramipexole (MIRAPEX) 0.125 MG tablet, Take 1 tablet (0.125 mg total) by mouth at bedtime., Disp: 90 tablet, Rfl: 1   zinc sulfate 220 (50 Zn) MG capsule, Take 1 capsule (220 mg total) by mouth daily., Disp: 30 capsule, Rfl: 0   alendronate  (FOSAMAX) 70 MG tablet, Take 1 tablet (70 mg total) by mouth every Thursday., Disp: 12 tablet, Rfl: 3   docusate calcium (SURFAK) 240 MG capsule, Take 1 capsule (240 mg total) by mouth daily., Disp: 90 capsule, Rfl: 1   famotidine (PEPCID) 10 MG tablet, Take 1 tablet (10 mg total) by mouth daily., Disp: 90 tablet, Rfl: 1   pantoprazole (PROTONIX) 20 MG tablet, Take 1 tablet (20 mg total) by mouth daily., Disp: 90 tablet, Rfl: 1  Allergies  Allergen Reactions   Amoxicillin Rash    Has patient had a PCN reaction causing immediate rash, facial/tongue/throat swelling, SOB or lightheadedness with hypotension:YES Has patient had a PCN reaction causing severe rash involving mucus membranes or skin necrosis: Yes Has patient had a PCN reaction that required hospitalization No Has patient had a PCN reaction occurring within the last 10 years: Yes If all of the above answers are "NO", then may proceed with Cephalosporin use. Cephalosporins ok   Elemental Sulfur Hives   Sulfur Hives   Peg 3350-Electrolytes Other (See Comments)   Sulfa Drugs Cross Reactors Hives and Itching   Miralax [Polyethylene Glycol] Itching   Mixed Grasses    Percocet [Oxycodone-Acetaminophen] Itching and Rash    Did not happen last time she took it   Sulfa Antibiotics Hives    Objective:   BP 140/85    Pulse 86    Temp 98.2 F (36.8 C) (Temporal)    Ht 5' 5" (1.651 m)    Wt 51.5 kg    SpO2 98%    BMI 18.89 kg/m    Physical Exam Vitals reviewed.  Constitutional:      General: She is not in acute distress.    Appearance: Normal appearance. She is not ill-appearing, toxic-appearing or diaphoretic.  HENT:     Head: Normocephalic and atraumatic.     Nose: Nose normal.     Mouth/Throat:     Mouth: Mucous membranes are moist.     Pharynx: Oropharynx is clear.  Eyes:     Extraocular Movements: Extraocular movements intact.     Conjunctiva/sclera: Conjunctivae normal.     Pupils: Pupils are equal, round, and reactive to  light.  Cardiovascular:     Rate and Rhythm: Normal rate. Rhythm irregular.     Pulses: Normal pulses.     Heart sounds: Normal heart sounds.  Pulmonary:     Effort: Pulmonary effort is normal. No respiratory distress.     Breath sounds: Normal breath sounds. No wheezing or rhonchi.  Chest:     Chest wall: No tenderness.  Abdominal:     General: There is no distension.     Palpations: Abdomen is soft. There is no mass.  Musculoskeletal:        General: Normal range of motion.     Cervical back: Normal range of motion.  Skin:    General: Skin is warm and dry.  Neurological:     General:  No focal deficit present.     Mental Status: She is alert and oriented to person, place, and time.     Motor: No weakness.     Gait: Gait abnormal (riding in [facility]).  Psychiatric:        Mood and Affect: Mood normal.        Behavior: Behavior normal.        Thought Content: Thought content normal.        Judgment: Judgment normal.

## 2021-04-16 ENCOUNTER — Encounter (HOSPITAL_COMMUNITY): Payer: Self-pay | Admitting: *Deleted

## 2021-04-16 DIAGNOSIS — E1121 Type 2 diabetes mellitus with diabetic nephropathy: Secondary | ICD-10-CM | POA: Diagnosis not present

## 2021-04-16 DIAGNOSIS — R519 Headache, unspecified: Secondary | ICD-10-CM | POA: Diagnosis not present

## 2021-04-16 DIAGNOSIS — N2581 Secondary hyperparathyroidism of renal origin: Secondary | ICD-10-CM | POA: Diagnosis not present

## 2021-04-16 DIAGNOSIS — D689 Coagulation defect, unspecified: Secondary | ICD-10-CM | POA: Diagnosis not present

## 2021-04-16 DIAGNOSIS — N185 Chronic kidney disease, stage 5: Secondary | ICD-10-CM | POA: Diagnosis not present

## 2021-04-16 DIAGNOSIS — Z4901 Encounter for fitting and adjustment of extracorporeal dialysis catheter: Secondary | ICD-10-CM | POA: Diagnosis not present

## 2021-04-16 DIAGNOSIS — Z992 Dependence on renal dialysis: Secondary | ICD-10-CM | POA: Diagnosis not present

## 2021-04-16 DIAGNOSIS — N186 End stage renal disease: Secondary | ICD-10-CM | POA: Diagnosis not present

## 2021-04-16 DIAGNOSIS — A499 Bacterial infection, unspecified: Secondary | ICD-10-CM | POA: Diagnosis not present

## 2021-04-16 LAB — CBC WITH DIFFERENTIAL/PLATELET
Basophils Absolute: 0.2 10*3/uL (ref 0.0–0.2)
Basos: 1 %
EOS (ABSOLUTE): 0.4 10*3/uL (ref 0.0–0.4)
Eos: 3 %
Hematocrit: 37.2 % (ref 34.0–46.6)
Hemoglobin: 12.4 g/dL (ref 11.1–15.9)
Immature Grans (Abs): 0.2 10*3/uL — ABNORMAL HIGH (ref 0.0–0.1)
Immature Granulocytes: 2 %
Lymphocytes Absolute: 1.4 10*3/uL (ref 0.7–3.1)
Lymphs: 9 %
MCH: 30.8 pg (ref 26.6–33.0)
MCHC: 33.3 g/dL (ref 31.5–35.7)
MCV: 92 fL (ref 79–97)
Monocytes Absolute: 0.7 10*3/uL (ref 0.1–0.9)
Monocytes: 5 %
Neutrophils Absolute: 11.9 10*3/uL — ABNORMAL HIGH (ref 1.4–7.0)
Neutrophils: 80 %
Platelets: 324 10*3/uL (ref 150–450)
RBC: 4.03 x10E6/uL (ref 3.77–5.28)
RDW: 13.6 % (ref 11.7–15.4)
WBC: 14.7 10*3/uL — ABNORMAL HIGH (ref 3.4–10.8)

## 2021-04-16 LAB — BMP8+EGFR
BUN/Creatinine Ratio: 5 — ABNORMAL LOW (ref 12–28)
BUN: 20 mg/dL (ref 8–27)
CO2: 27 mmol/L (ref 20–29)
Calcium: 8.8 mg/dL (ref 8.7–10.3)
Chloride: 102 mmol/L (ref 96–106)
Creatinine, Ser: 3.96 mg/dL (ref 0.57–1.00)
Glucose: 77 mg/dL (ref 70–99)
Potassium: 4.4 mmol/L (ref 3.5–5.2)
Sodium: 143 mmol/L (ref 134–144)
eGFR: 11 mL/min/{1.73_m2} — ABNORMAL LOW (ref >59)

## 2021-04-16 LAB — BRAIN NATRIURETIC PEPTIDE: BNP: 650.5 pg/mL — ABNORMAL HIGH (ref 0.0–100.0)

## 2021-04-18 ENCOUNTER — Encounter: Payer: Self-pay | Admitting: Family Medicine

## 2021-04-19 DIAGNOSIS — N185 Chronic kidney disease, stage 5: Secondary | ICD-10-CM | POA: Diagnosis not present

## 2021-04-19 DIAGNOSIS — N2581 Secondary hyperparathyroidism of renal origin: Secondary | ICD-10-CM | POA: Diagnosis not present

## 2021-04-19 DIAGNOSIS — E1121 Type 2 diabetes mellitus with diabetic nephropathy: Secondary | ICD-10-CM | POA: Diagnosis not present

## 2021-04-19 DIAGNOSIS — Z992 Dependence on renal dialysis: Secondary | ICD-10-CM | POA: Diagnosis not present

## 2021-04-19 DIAGNOSIS — A499 Bacterial infection, unspecified: Secondary | ICD-10-CM | POA: Diagnosis not present

## 2021-04-19 DIAGNOSIS — D689 Coagulation defect, unspecified: Secondary | ICD-10-CM | POA: Diagnosis not present

## 2021-04-19 DIAGNOSIS — Z4901 Encounter for fitting and adjustment of extracorporeal dialysis catheter: Secondary | ICD-10-CM | POA: Diagnosis not present

## 2021-04-19 DIAGNOSIS — R519 Headache, unspecified: Secondary | ICD-10-CM | POA: Diagnosis not present

## 2021-04-19 DIAGNOSIS — N186 End stage renal disease: Secondary | ICD-10-CM | POA: Diagnosis not present

## 2021-04-21 DIAGNOSIS — N185 Chronic kidney disease, stage 5: Secondary | ICD-10-CM | POA: Diagnosis not present

## 2021-04-21 DIAGNOSIS — N2581 Secondary hyperparathyroidism of renal origin: Secondary | ICD-10-CM | POA: Diagnosis not present

## 2021-04-21 DIAGNOSIS — Z4901 Encounter for fitting and adjustment of extracorporeal dialysis catheter: Secondary | ICD-10-CM | POA: Diagnosis not present

## 2021-04-21 DIAGNOSIS — R519 Headache, unspecified: Secondary | ICD-10-CM | POA: Diagnosis not present

## 2021-04-21 DIAGNOSIS — D689 Coagulation defect, unspecified: Secondary | ICD-10-CM | POA: Diagnosis not present

## 2021-04-21 DIAGNOSIS — A499 Bacterial infection, unspecified: Secondary | ICD-10-CM | POA: Diagnosis not present

## 2021-04-21 DIAGNOSIS — E1121 Type 2 diabetes mellitus with diabetic nephropathy: Secondary | ICD-10-CM | POA: Diagnosis not present

## 2021-04-21 DIAGNOSIS — N186 End stage renal disease: Secondary | ICD-10-CM | POA: Diagnosis not present

## 2021-04-21 DIAGNOSIS — Z992 Dependence on renal dialysis: Secondary | ICD-10-CM | POA: Diagnosis not present

## 2021-04-23 DIAGNOSIS — N185 Chronic kidney disease, stage 5: Secondary | ICD-10-CM | POA: Diagnosis not present

## 2021-04-23 DIAGNOSIS — N2581 Secondary hyperparathyroidism of renal origin: Secondary | ICD-10-CM | POA: Diagnosis not present

## 2021-04-23 DIAGNOSIS — Z4901 Encounter for fitting and adjustment of extracorporeal dialysis catheter: Secondary | ICD-10-CM | POA: Diagnosis not present

## 2021-04-23 DIAGNOSIS — N186 End stage renal disease: Secondary | ICD-10-CM | POA: Diagnosis not present

## 2021-04-23 DIAGNOSIS — E1121 Type 2 diabetes mellitus with diabetic nephropathy: Secondary | ICD-10-CM | POA: Diagnosis not present

## 2021-04-23 DIAGNOSIS — A499 Bacterial infection, unspecified: Secondary | ICD-10-CM | POA: Diagnosis not present

## 2021-04-23 DIAGNOSIS — R519 Headache, unspecified: Secondary | ICD-10-CM | POA: Diagnosis not present

## 2021-04-23 DIAGNOSIS — Z992 Dependence on renal dialysis: Secondary | ICD-10-CM | POA: Diagnosis not present

## 2021-04-23 DIAGNOSIS — D689 Coagulation defect, unspecified: Secondary | ICD-10-CM | POA: Diagnosis not present

## 2021-04-26 DIAGNOSIS — N2581 Secondary hyperparathyroidism of renal origin: Secondary | ICD-10-CM | POA: Diagnosis not present

## 2021-04-26 DIAGNOSIS — Z992 Dependence on renal dialysis: Secondary | ICD-10-CM | POA: Diagnosis not present

## 2021-04-26 DIAGNOSIS — D689 Coagulation defect, unspecified: Secondary | ICD-10-CM | POA: Diagnosis not present

## 2021-04-26 DIAGNOSIS — Z4901 Encounter for fitting and adjustment of extracorporeal dialysis catheter: Secondary | ICD-10-CM | POA: Diagnosis not present

## 2021-04-26 DIAGNOSIS — E1121 Type 2 diabetes mellitus with diabetic nephropathy: Secondary | ICD-10-CM | POA: Diagnosis not present

## 2021-04-26 DIAGNOSIS — N186 End stage renal disease: Secondary | ICD-10-CM | POA: Diagnosis not present

## 2021-04-26 DIAGNOSIS — R519 Headache, unspecified: Secondary | ICD-10-CM | POA: Diagnosis not present

## 2021-04-26 DIAGNOSIS — N185 Chronic kidney disease, stage 5: Secondary | ICD-10-CM | POA: Diagnosis not present

## 2021-04-26 DIAGNOSIS — A499 Bacterial infection, unspecified: Secondary | ICD-10-CM | POA: Diagnosis not present

## 2021-04-28 ENCOUNTER — Encounter: Payer: Self-pay | Admitting: Internal Medicine

## 2021-04-28 ENCOUNTER — Other Ambulatory Visit: Payer: Self-pay

## 2021-04-28 ENCOUNTER — Ambulatory Visit (INDEPENDENT_AMBULATORY_CARE_PROVIDER_SITE_OTHER): Payer: Medicare Other | Admitting: Internal Medicine

## 2021-04-28 VITALS — BP 120/78 | HR 66 | Temp 97.9°F | Wt 110.0 lb

## 2021-04-28 DIAGNOSIS — N2581 Secondary hyperparathyroidism of renal origin: Secondary | ICD-10-CM | POA: Diagnosis not present

## 2021-04-28 DIAGNOSIS — E1121 Type 2 diabetes mellitus with diabetic nephropathy: Secondary | ICD-10-CM | POA: Diagnosis not present

## 2021-04-28 DIAGNOSIS — Z992 Dependence on renal dialysis: Secondary | ICD-10-CM | POA: Diagnosis not present

## 2021-04-28 DIAGNOSIS — T827XXA Infection and inflammatory reaction due to other cardiac and vascular devices, implants and grafts, initial encounter: Secondary | ICD-10-CM

## 2021-04-28 DIAGNOSIS — A499 Bacterial infection, unspecified: Secondary | ICD-10-CM | POA: Diagnosis not present

## 2021-04-28 DIAGNOSIS — Z4901 Encounter for fitting and adjustment of extracorporeal dialysis catheter: Secondary | ICD-10-CM | POA: Diagnosis not present

## 2021-04-28 DIAGNOSIS — R519 Headache, unspecified: Secondary | ICD-10-CM | POA: Diagnosis not present

## 2021-04-28 DIAGNOSIS — D689 Coagulation defect, unspecified: Secondary | ICD-10-CM | POA: Diagnosis not present

## 2021-04-28 DIAGNOSIS — N186 End stage renal disease: Secondary | ICD-10-CM | POA: Diagnosis not present

## 2021-04-28 DIAGNOSIS — N185 Chronic kidney disease, stage 5: Secondary | ICD-10-CM | POA: Diagnosis not present

## 2021-04-28 NOTE — Progress Notes (Signed)
Patient Active Problem List   Diagnosis Date Noted   Sepsis ([place]) 04/05/2021   COVID-19 virus infection 04/05/2021   Infection of hemodialysis tunneled catheter ([place]) 04/05/2021   Elevated troponin 04/05/2021   Controlled substance agreement signed 05/16/2019   Arthritis 02/14/2019   Chronic midline thoracic back pain 02/14/2019   Restless leg syndrome 10/26/2018   Vitamin D deficiency 10/26/2018   Age-related osteoporosis without current pathological fracture 81/24/2020   Essential hypertension    Gout    COPD (chronic obstructive pulmonary disease) (HCC)    Anxiety    ESRD on hemodialysis ([place])    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    Adenomatous polyp of descending colon    Adenomatous polyp of transverse colon    Depression 08/19/2018   Pernicious anemia 12/12/2017   Moderate protein-calorie malnutrition ([place]) 06/02/2017   Gastroesophageal reflux disease without esophagitis    Dyslipidemia 02/09/2016   Do not resuscitate 03/31/2015   PAF (paroxysmal atrial fibrillation) ([place]) 02/04/2015   Acquired hypothyroidism    Anemia in chronic kidney disease 12/09/2014   Steal syndrome dialysis vascular access ([place]) 12/03/2014   Secondary hyperparathyroidism of renal origin ([place]) 11/17/2014   Physical deconditioning 08/04/2013   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   ALBUTEROL (VENTOLIN HFA) 108 (90 BASE) MCG/ACT INHALER    Inhale 2 puffs into the lungs every 6 (six) hours as needed for wheezing or shortness of breath.   ALENDRONATE (FOSAMAX) 70 MG TABLET    Take 1 tablet (70 mg total) by mouth every Thursday.   ASCORBIC ACID  (VITAMIN C) 500 MG TABLET    Take 1 tablet (500 mg total) by mouth daily.   AURYXIA 1 GM 210 MG(FE) TABLET    Take 420 mg by mouth 3 (three) times daily with meals.   BUDESONIDE-FORMOTEROL (SYMBICORT) 80-4.5 MCG/ACT INHALER    Inhale 2 puffs into the lungs 2 (two) times daily.   CINACALCET (SENSIPAR) 30 MG TABLET    Take 1 tablet (30 mg total) by mouth every Monday, Wednesday, and Friday.   CITALOPRAM (CELEXA) 40 MG TABLET    TAKE 1 TABLET BY MOUTH EVERY DAY   DOCUSATE CALCIUM (SURFAK) 240 MG CAPSULE    Take 1 capsule (240 mg total) by mouth daily.   DOXERCALCIFEROL (HECTOROL) 4 MCG/2ML INJECTION    Inject 0.5 mLs (1 mcg total) into the vein every Monday, Wednesday, and Friday with hemodialysis.   EZETIMIBE (ZETIA) 10 MG TABLET    TAKE 1 TABLET BY MOUTH EVERY DAY   FAMOTIDINE (PEPCID) 10 MG TABLET    Take 1 tablet (10 mg total) by mouth daily.   HYDRALAZINE (APRESOLINE) 10 MG TABLET    TAKE 1 TABLET BY MOUTH THREE TIMES A DAY   HYDROCODONE-ACETAMINOPHEN (NORCO) 7.5-325 MG TABLET    Take 1 tablet by mouth every 6 (six) hours as needed for moderate pain.   LEVOCETIRIZINE (XYZAL) 5 MG TABLET    TAKE 1 TABLET BY MOUTH EVERY DAY IN THE EVENING   LEVOTHYROXINE (SYNTHROID) 125 MCG TABLET    TAKE 1 TABLET BY MOUTH  EVERY DAY   MULTIVITAMIN (RENA-VIT) TABS TABLET    Take 1 tablet by mouth daily.   NYSTATIN CREAM (MYCOSTATIN)    Apply 1 application topically 2 (two) times daily as needed for dry skin.   PANTOPRAZOLE (PROTONIX) 20 MG TABLET    Take 1 tablet (20 mg total) by mouth daily.   PRAMIPEXOLE (MIRAPEX) 0.125 MG TABLET    Take 1 tablet (0.125 mg total) by mouth at bedtime.   ZINC SULFATE 220 (50 ZN) MG CAPSULE    Take 1 capsule (220 mg total) by mouth daily.  Modified Medications   No medications on file  Discontinued Medications   No medications on file    Subjective: 81 YF ESRD on hemodialysis via RIJ HD cath, COPD, history of MRSA bacteremia in 2019 status right HD catheter removal admitted  for sepsis secondary to Left IJ HD catheter infection at [facility](1/2-04/10/21). Blood Cx were negative. On exam left HD catheter site appeared grossly infected. Hd cath was pulled on 04/10/21(RIJ placed) and discharged on plan to do vancomycin x 2 weeks with iHD from cath removal. Today: 04/28/21: Pt reports feeling well. No concerns around right IJ HD site. Denies fever, chill, N,V,D.   Review of Systems: ROS  Past Medical History:  Diagnosis Date   Acute respiratory failure ([place]) 05/2017   Anemia    Anxiety    Arthritis    COPD (chronic obstructive pulmonary disease) (HCC)    Depression    ESRD (end stage renal disease) (HCC)    dialysis MWF   GERD (gastroesophageal reflux disease)    Gout    History of blood transfusion    History of diabetes mellitus    "diet controlled"   HLD (hyperlipidemia)    HOH (hard of hearing)    left ear   Hypertension    hypotensive -since starting dialysis   Hypothyroidism    MRSA (methicillin resistant staph aureus) culture positive 06/01/2017   PAF (paroxysmal atrial fibrillation) ([place])    a. Echo 11/16:  Mild LVH, EF 55-60%, normal wall motion, MAC, mild MR, severe LAE (49 ml/m2), mild RVE, normal RVSF, mild RAE, mild TR, PASP 24 mmHg;  CHADS2-VASc: 4 >> Coumadin followed by PCP    Social History   Tobacco Use   Smoking status: Former    Packs/day: 0.25    Years: 2.00    Pack years: 0.50    Types: Cigarettes    Quit date: 04/04/1998    Years since quitting: 23.0   Smokeless tobacco: Never  Vaping Use   Vaping Use: Never used  Substance Use Topics   Alcohol use: No    Alcohol/week: 0.0 standard drinks   Drug use: No    Family History  Problem Relation Age of Onset   Arthritis Mother    Diabetes Father        before age 45   Heart disease Father    Diabetes Sister    Cancer Brother    Hyperlipidemia Daughter    Hypertension Daughter    Diabetes Daughter    Hypertension Son    Hyperlipidemia Son    Dementia Sister    Cancer  Sister        Unknown   Other Sister        Died in car accident   Stroke Brother    Diabetes Daughter    Hypertension Daughter    Hyperlipidemia Daughter    Hepatitis C Son     Allergies  Allergen  Reactions   Amoxicillin Rash    Has patient had a PCN reaction causing immediate rash, facial/tongue/throat swelling, SOB or lightheadedness with hypotension:YES Has patient had a PCN reaction causing severe rash involving mucus membranes or skin necrosis: Yes Has patient had a PCN reaction that required hospitalization No Has patient had a PCN reaction occurring within the last 10 years: Yes If all of the above answers are "NO", then may proceed with Cephalosporin use. Cephalosporins ok   Elemental Sulfur Hives   Sulfur Hives   Peg 3350-Electrolytes Other (See Comments)   Sulfa Drugs Cross Reactors Hives and Itching   Miralax [Polyethylene Glycol] Itching   Mixed Grasses    Percocet [Oxycodone-Acetaminophen] Itching and Rash    Did not happen last time she took it   Sulfa Antibiotics Hives    Health Maintenance  Topic Date Due   FOOT EXAM  Never done   HEMOGLOBIN A1C  11/22/2017   OPHTHALMOLOGY EXAM  01/08/2020   COVID-19 Vaccine (4 - Booster for Moderna series) 05/21/2020   Zoster Vaccines- Shingrix (1 of 2) 05/26/2021 (Originally 04/28/1959)   DEXA SCAN  05/15/2021   TETANUS/TDAP  02/10/2030   Pneumonia Vaccine 21+ Years old  Completed   INFLUENZA VACCINE  Completed   HPV VACCINES  Aged Out    Objective:  Vitals:   04/28/21 1034  BP: 120/78  Pulse: 66  Temp: 97.9 F (36.6 C)  TempSrc: Oral  SpO2: 97%  Weight: 110 lb (49.9 kg)   Body mass index is 18.3 kg/m.  Physical Exam Constitutional:      Appearance: Normal appearance.  HENT:     Head: Normocephalic and atraumatic.     Right Ear: Tympanic membrane normal.     Left Ear: Tympanic membrane normal.     Nose: Nose normal.     Mouth/Throat:     Mouth: Mucous membranes are moist.  Eyes:     Extraocular  Movements: Extraocular movements intact.     Conjunctiva/sclera: Conjunctivae normal.     Pupils: Pupils are equal, round, and reactive to light.  Cardiovascular:     Rate and Rhythm: Normal rate and regular rhythm.     Heart sounds: No murmur heard.   No friction rub. No gallop.  Pulmonary:     Effort: Pulmonary effort is normal.     Breath sounds: Normal breath sounds.  Abdominal:     General: Abdomen is flat.     Palpations: Abdomen is soft.  Musculoskeletal:        General: Normal range of motion.  Skin:    General: Skin is warm and dry.     Comments: RIJ HD cath C/D/I Old left IJ site healing without erythema/tenderness  Neurological:     General: No focal deficit present.     Mental Status: She is alert and oriented to person, place, and time.  Psychiatric:        Mood and Affect: Mood normal.    Lab Results Lab Results  Component Value Date   WBC 14.7 (H) 04/15/2021   HGB 12.4 04/15/2021   HCT 37.2 04/15/2021   MCV 92 04/15/2021   PLT 324 04/15/2021    Lab Results  Component Value Date   CREATININE 3.96 (HH) 04/15/2021   BUN 20 04/15/2021   NA 143 04/15/2021   K 4.4 04/15/2021   CL 102 04/15/2021   CO2 27 04/15/2021    Lab Results  Component Value Date   ALT 15 04/10/2021   AST  19 04/10/2021   ALKPHOS 80 04/10/2021   BILITOT 0.7 04/10/2021    Lab Results  Component Value Date   CHOL 144 02/23/2021   HDL 68 02/23/2021   LDLCALC 62 02/23/2021   TRIG 41 04/05/2021   CHOLHDL 2.1 02/23/2021   No results found for: LABRPR, RPRTITER No results found for: HIV1RNAQUANT, HIV1RNAVL, CD4TABS   #Left IJ HD catheter site infection #Hx of MRSA PNA and bacteremia SP HD catheter removal -On 1/7 left IJ removed and right IJ placed -SP vancomycin with iHD x 2 weeks from cath removal -Follow-up PRN  Laurice Record, MD [place] [facility] 04/28/2021, 10:47 AM

## 2021-04-29 DIAGNOSIS — I480 Paroxysmal atrial fibrillation: Secondary | ICD-10-CM | POA: Diagnosis not present

## 2021-04-30 DIAGNOSIS — N2581 Secondary hyperparathyroidism of renal origin: Secondary | ICD-10-CM | POA: Diagnosis not present

## 2021-04-30 DIAGNOSIS — E1121 Type 2 diabetes mellitus with diabetic nephropathy: Secondary | ICD-10-CM | POA: Diagnosis not present

## 2021-04-30 DIAGNOSIS — A499 Bacterial infection, unspecified: Secondary | ICD-10-CM | POA: Diagnosis not present

## 2021-04-30 DIAGNOSIS — N186 End stage renal disease: Secondary | ICD-10-CM | POA: Diagnosis not present

## 2021-04-30 DIAGNOSIS — Z992 Dependence on renal dialysis: Secondary | ICD-10-CM | POA: Diagnosis not present

## 2021-04-30 DIAGNOSIS — R519 Headache, unspecified: Secondary | ICD-10-CM | POA: Diagnosis not present

## 2021-04-30 DIAGNOSIS — N185 Chronic kidney disease, stage 5: Secondary | ICD-10-CM | POA: Diagnosis not present

## 2021-04-30 DIAGNOSIS — Z4901 Encounter for fitting and adjustment of extracorporeal dialysis catheter: Secondary | ICD-10-CM | POA: Diagnosis not present

## 2021-04-30 DIAGNOSIS — D689 Coagulation defect, unspecified: Secondary | ICD-10-CM | POA: Diagnosis not present

## 2021-05-03 DIAGNOSIS — Z992 Dependence on renal dialysis: Secondary | ICD-10-CM | POA: Diagnosis not present

## 2021-05-03 DIAGNOSIS — Z4901 Encounter for fitting and adjustment of extracorporeal dialysis catheter: Secondary | ICD-10-CM | POA: Diagnosis not present

## 2021-05-03 DIAGNOSIS — E1121 Type 2 diabetes mellitus with diabetic nephropathy: Secondary | ICD-10-CM | POA: Diagnosis not present

## 2021-05-03 DIAGNOSIS — R519 Headache, unspecified: Secondary | ICD-10-CM | POA: Diagnosis not present

## 2021-05-03 DIAGNOSIS — N2581 Secondary hyperparathyroidism of renal origin: Secondary | ICD-10-CM | POA: Diagnosis not present

## 2021-05-03 DIAGNOSIS — D689 Coagulation defect, unspecified: Secondary | ICD-10-CM | POA: Diagnosis not present

## 2021-05-03 DIAGNOSIS — A499 Bacterial infection, unspecified: Secondary | ICD-10-CM | POA: Diagnosis not present

## 2021-05-03 DIAGNOSIS — N186 End stage renal disease: Secondary | ICD-10-CM | POA: Diagnosis not present

## 2021-05-03 DIAGNOSIS — N185 Chronic kidney disease, stage 5: Secondary | ICD-10-CM | POA: Diagnosis not present

## 2021-05-04 DIAGNOSIS — N186 End stage renal disease: Secondary | ICD-10-CM | POA: Diagnosis not present

## 2021-05-04 DIAGNOSIS — Z992 Dependence on renal dialysis: Secondary | ICD-10-CM | POA: Diagnosis not present

## 2021-05-04 DIAGNOSIS — E1122 Type 2 diabetes mellitus with diabetic chronic kidney disease: Secondary | ICD-10-CM | POA: Diagnosis not present

## 2021-05-05 DIAGNOSIS — Z992 Dependence on renal dialysis: Secondary | ICD-10-CM | POA: Diagnosis not present

## 2021-05-05 DIAGNOSIS — D631 Anemia in chronic kidney disease: Secondary | ICD-10-CM | POA: Diagnosis not present

## 2021-05-05 DIAGNOSIS — N2581 Secondary hyperparathyroidism of renal origin: Secondary | ICD-10-CM | POA: Diagnosis not present

## 2021-05-05 DIAGNOSIS — D689 Coagulation defect, unspecified: Secondary | ICD-10-CM | POA: Diagnosis not present

## 2021-05-05 DIAGNOSIS — R519 Headache, unspecified: Secondary | ICD-10-CM | POA: Diagnosis not present

## 2021-05-05 DIAGNOSIS — N186 End stage renal disease: Secondary | ICD-10-CM | POA: Diagnosis not present

## 2021-05-05 DIAGNOSIS — Z4901 Encounter for fitting and adjustment of extracorporeal dialysis catheter: Secondary | ICD-10-CM | POA: Diagnosis not present

## 2021-05-07 DIAGNOSIS — D631 Anemia in chronic kidney disease: Secondary | ICD-10-CM | POA: Diagnosis not present

## 2021-05-07 DIAGNOSIS — D689 Coagulation defect, unspecified: Secondary | ICD-10-CM | POA: Diagnosis not present

## 2021-05-07 DIAGNOSIS — R519 Headache, unspecified: Secondary | ICD-10-CM | POA: Diagnosis not present

## 2021-05-07 DIAGNOSIS — N186 End stage renal disease: Secondary | ICD-10-CM | POA: Diagnosis not present

## 2021-05-07 DIAGNOSIS — N2581 Secondary hyperparathyroidism of renal origin: Secondary | ICD-10-CM | POA: Diagnosis not present

## 2021-05-07 DIAGNOSIS — Z992 Dependence on renal dialysis: Secondary | ICD-10-CM | POA: Diagnosis not present

## 2021-05-07 DIAGNOSIS — Z4901 Encounter for fitting and adjustment of extracorporeal dialysis catheter: Secondary | ICD-10-CM | POA: Diagnosis not present

## 2021-05-10 DIAGNOSIS — Z4901 Encounter for fitting and adjustment of extracorporeal dialysis catheter: Secondary | ICD-10-CM | POA: Diagnosis not present

## 2021-05-10 DIAGNOSIS — D689 Coagulation defect, unspecified: Secondary | ICD-10-CM | POA: Diagnosis not present

## 2021-05-10 DIAGNOSIS — N186 End stage renal disease: Secondary | ICD-10-CM | POA: Diagnosis not present

## 2021-05-10 DIAGNOSIS — N2581 Secondary hyperparathyroidism of renal origin: Secondary | ICD-10-CM | POA: Diagnosis not present

## 2021-05-10 DIAGNOSIS — R519 Headache, unspecified: Secondary | ICD-10-CM | POA: Diagnosis not present

## 2021-05-10 DIAGNOSIS — Z992 Dependence on renal dialysis: Secondary | ICD-10-CM | POA: Diagnosis not present

## 2021-05-10 DIAGNOSIS — D631 Anemia in chronic kidney disease: Secondary | ICD-10-CM | POA: Diagnosis not present

## 2021-05-11 DIAGNOSIS — Z992 Dependence on renal dialysis: Secondary | ICD-10-CM | POA: Diagnosis not present

## 2021-05-11 DIAGNOSIS — D631 Anemia in chronic kidney disease: Secondary | ICD-10-CM | POA: Diagnosis not present

## 2021-05-11 DIAGNOSIS — R519 Headache, unspecified: Secondary | ICD-10-CM | POA: Diagnosis not present

## 2021-05-11 DIAGNOSIS — Z4901 Encounter for fitting and adjustment of extracorporeal dialysis catheter: Secondary | ICD-10-CM | POA: Diagnosis not present

## 2021-05-11 DIAGNOSIS — N186 End stage renal disease: Secondary | ICD-10-CM | POA: Diagnosis not present

## 2021-05-11 DIAGNOSIS — D689 Coagulation defect, unspecified: Secondary | ICD-10-CM | POA: Diagnosis not present

## 2021-05-11 DIAGNOSIS — N2581 Secondary hyperparathyroidism of renal origin: Secondary | ICD-10-CM | POA: Diagnosis not present

## 2021-05-12 DIAGNOSIS — Z992 Dependence on renal dialysis: Secondary | ICD-10-CM | POA: Diagnosis not present

## 2021-05-12 DIAGNOSIS — N186 End stage renal disease: Secondary | ICD-10-CM | POA: Diagnosis not present

## 2021-05-12 DIAGNOSIS — R519 Headache, unspecified: Secondary | ICD-10-CM | POA: Diagnosis not present

## 2021-05-12 DIAGNOSIS — N2581 Secondary hyperparathyroidism of renal origin: Secondary | ICD-10-CM | POA: Diagnosis not present

## 2021-05-12 DIAGNOSIS — Z4901 Encounter for fitting and adjustment of extracorporeal dialysis catheter: Secondary | ICD-10-CM | POA: Diagnosis not present

## 2021-05-12 DIAGNOSIS — D631 Anemia in chronic kidney disease: Secondary | ICD-10-CM | POA: Diagnosis not present

## 2021-05-12 DIAGNOSIS — D689 Coagulation defect, unspecified: Secondary | ICD-10-CM | POA: Diagnosis not present

## 2021-05-14 DIAGNOSIS — N186 End stage renal disease: Secondary | ICD-10-CM | POA: Diagnosis not present

## 2021-05-14 DIAGNOSIS — D689 Coagulation defect, unspecified: Secondary | ICD-10-CM | POA: Diagnosis not present

## 2021-05-14 DIAGNOSIS — Z4901 Encounter for fitting and adjustment of extracorporeal dialysis catheter: Secondary | ICD-10-CM | POA: Diagnosis not present

## 2021-05-14 DIAGNOSIS — D631 Anemia in chronic kidney disease: Secondary | ICD-10-CM | POA: Diagnosis not present

## 2021-05-14 DIAGNOSIS — R519 Headache, unspecified: Secondary | ICD-10-CM | POA: Diagnosis not present

## 2021-05-14 DIAGNOSIS — N2581 Secondary hyperparathyroidism of renal origin: Secondary | ICD-10-CM | POA: Diagnosis not present

## 2021-05-14 DIAGNOSIS — Z992 Dependence on renal dialysis: Secondary | ICD-10-CM | POA: Diagnosis not present

## 2021-05-17 DIAGNOSIS — N2581 Secondary hyperparathyroidism of renal origin: Secondary | ICD-10-CM | POA: Diagnosis not present

## 2021-05-17 DIAGNOSIS — D631 Anemia in chronic kidney disease: Secondary | ICD-10-CM | POA: Diagnosis not present

## 2021-05-17 DIAGNOSIS — D689 Coagulation defect, unspecified: Secondary | ICD-10-CM | POA: Diagnosis not present

## 2021-05-17 DIAGNOSIS — Z4901 Encounter for fitting and adjustment of extracorporeal dialysis catheter: Secondary | ICD-10-CM | POA: Diagnosis not present

## 2021-05-17 DIAGNOSIS — Z992 Dependence on renal dialysis: Secondary | ICD-10-CM | POA: Diagnosis not present

## 2021-05-17 DIAGNOSIS — R519 Headache, unspecified: Secondary | ICD-10-CM | POA: Diagnosis not present

## 2021-05-17 DIAGNOSIS — N186 End stage renal disease: Secondary | ICD-10-CM | POA: Diagnosis not present

## 2021-05-19 DIAGNOSIS — R519 Headache, unspecified: Secondary | ICD-10-CM | POA: Diagnosis not present

## 2021-05-19 DIAGNOSIS — D689 Coagulation defect, unspecified: Secondary | ICD-10-CM | POA: Diagnosis not present

## 2021-05-19 DIAGNOSIS — D631 Anemia in chronic kidney disease: Secondary | ICD-10-CM | POA: Diagnosis not present

## 2021-05-19 DIAGNOSIS — N2581 Secondary hyperparathyroidism of renal origin: Secondary | ICD-10-CM | POA: Diagnosis not present

## 2021-05-19 DIAGNOSIS — N186 End stage renal disease: Secondary | ICD-10-CM | POA: Diagnosis not present

## 2021-05-19 DIAGNOSIS — Z992 Dependence on renal dialysis: Secondary | ICD-10-CM | POA: Diagnosis not present

## 2021-05-19 DIAGNOSIS — Z4901 Encounter for fitting and adjustment of extracorporeal dialysis catheter: Secondary | ICD-10-CM | POA: Diagnosis not present

## 2021-05-21 DIAGNOSIS — N2581 Secondary hyperparathyroidism of renal origin: Secondary | ICD-10-CM | POA: Diagnosis not present

## 2021-05-21 DIAGNOSIS — D689 Coagulation defect, unspecified: Secondary | ICD-10-CM | POA: Diagnosis not present

## 2021-05-21 DIAGNOSIS — R519 Headache, unspecified: Secondary | ICD-10-CM | POA: Diagnosis not present

## 2021-05-21 DIAGNOSIS — D631 Anemia in chronic kidney disease: Secondary | ICD-10-CM | POA: Diagnosis not present

## 2021-05-21 DIAGNOSIS — N186 End stage renal disease: Secondary | ICD-10-CM | POA: Diagnosis not present

## 2021-05-21 DIAGNOSIS — Z992 Dependence on renal dialysis: Secondary | ICD-10-CM | POA: Diagnosis not present

## 2021-05-21 DIAGNOSIS — Z4901 Encounter for fitting and adjustment of extracorporeal dialysis catheter: Secondary | ICD-10-CM | POA: Diagnosis not present

## 2021-05-24 DIAGNOSIS — N186 End stage renal disease: Secondary | ICD-10-CM | POA: Diagnosis not present

## 2021-05-24 DIAGNOSIS — D631 Anemia in chronic kidney disease: Secondary | ICD-10-CM | POA: Diagnosis not present

## 2021-05-24 DIAGNOSIS — Z992 Dependence on renal dialysis: Secondary | ICD-10-CM | POA: Diagnosis not present

## 2021-05-24 DIAGNOSIS — Z4901 Encounter for fitting and adjustment of extracorporeal dialysis catheter: Secondary | ICD-10-CM | POA: Diagnosis not present

## 2021-05-24 DIAGNOSIS — R519 Headache, unspecified: Secondary | ICD-10-CM | POA: Diagnosis not present

## 2021-05-24 DIAGNOSIS — D689 Coagulation defect, unspecified: Secondary | ICD-10-CM | POA: Diagnosis not present

## 2021-05-24 DIAGNOSIS — N2581 Secondary hyperparathyroidism of renal origin: Secondary | ICD-10-CM | POA: Diagnosis not present

## 2021-05-26 DIAGNOSIS — Z4901 Encounter for fitting and adjustment of extracorporeal dialysis catheter: Secondary | ICD-10-CM | POA: Diagnosis not present

## 2021-05-26 DIAGNOSIS — N2581 Secondary hyperparathyroidism of renal origin: Secondary | ICD-10-CM | POA: Diagnosis not present

## 2021-05-26 DIAGNOSIS — D631 Anemia in chronic kidney disease: Secondary | ICD-10-CM | POA: Diagnosis not present

## 2021-05-26 DIAGNOSIS — D689 Coagulation defect, unspecified: Secondary | ICD-10-CM | POA: Diagnosis not present

## 2021-05-26 DIAGNOSIS — R519 Headache, unspecified: Secondary | ICD-10-CM | POA: Diagnosis not present

## 2021-05-26 DIAGNOSIS — N186 End stage renal disease: Secondary | ICD-10-CM | POA: Diagnosis not present

## 2021-05-26 DIAGNOSIS — Z992 Dependence on renal dialysis: Secondary | ICD-10-CM | POA: Diagnosis not present

## 2021-05-27 ENCOUNTER — Ambulatory Visit (INDEPENDENT_AMBULATORY_CARE_PROVIDER_SITE_OTHER): Payer: Medicare Other | Admitting: Family Medicine

## 2021-05-27 ENCOUNTER — Encounter: Payer: Self-pay | Admitting: Family Medicine

## 2021-05-27 ENCOUNTER — Ambulatory Visit (INDEPENDENT_AMBULATORY_CARE_PROVIDER_SITE_OTHER): Payer: Medicare Other

## 2021-05-27 VITALS — BP 132/82 | HR 76 | Temp 98.3°F

## 2021-05-27 DIAGNOSIS — E559 Vitamin D deficiency, unspecified: Secondary | ICD-10-CM

## 2021-05-27 DIAGNOSIS — J449 Chronic obstructive pulmonary disease, unspecified: Secondary | ICD-10-CM | POA: Diagnosis not present

## 2021-05-27 DIAGNOSIS — M810 Age-related osteoporosis without current pathological fracture: Secondary | ICD-10-CM | POA: Diagnosis not present

## 2021-05-27 DIAGNOSIS — I480 Paroxysmal atrial fibrillation: Secondary | ICD-10-CM | POA: Diagnosis not present

## 2021-05-27 DIAGNOSIS — M1990 Unspecified osteoarthritis, unspecified site: Secondary | ICD-10-CM

## 2021-05-27 DIAGNOSIS — D631 Anemia in chronic kidney disease: Secondary | ICD-10-CM

## 2021-05-27 DIAGNOSIS — N186 End stage renal disease: Secondary | ICD-10-CM

## 2021-05-27 DIAGNOSIS — I10 Essential (primary) hypertension: Secondary | ICD-10-CM | POA: Diagnosis not present

## 2021-05-27 DIAGNOSIS — Z79899 Other long term (current) drug therapy: Secondary | ICD-10-CM

## 2021-05-27 DIAGNOSIS — K219 Gastro-esophageal reflux disease without esophagitis: Secondary | ICD-10-CM

## 2021-05-27 DIAGNOSIS — G8929 Other chronic pain: Secondary | ICD-10-CM

## 2021-05-27 DIAGNOSIS — G2581 Restless legs syndrome: Secondary | ICD-10-CM

## 2021-05-27 DIAGNOSIS — F3342 Major depressive disorder, recurrent, in full remission: Secondary | ICD-10-CM

## 2021-05-27 DIAGNOSIS — E1122 Type 2 diabetes mellitus with diabetic chronic kidney disease: Secondary | ICD-10-CM

## 2021-05-27 DIAGNOSIS — E785 Hyperlipidemia, unspecified: Secondary | ICD-10-CM | POA: Diagnosis not present

## 2021-05-27 DIAGNOSIS — Z23 Encounter for immunization: Secondary | ICD-10-CM

## 2021-05-27 DIAGNOSIS — M546 Pain in thoracic spine: Secondary | ICD-10-CM

## 2021-05-27 DIAGNOSIS — Z992 Dependence on renal dialysis: Secondary | ICD-10-CM

## 2021-05-27 DIAGNOSIS — F419 Anxiety disorder, unspecified: Secondary | ICD-10-CM

## 2021-05-27 DIAGNOSIS — E039 Hypothyroidism, unspecified: Secondary | ICD-10-CM

## 2021-05-27 LAB — BAYER DCA HB A1C WAIVED: HB A1C (BAYER DCA - WAIVED): 4.1 % — ABNORMAL LOW (ref 4.8–5.6)

## 2021-05-27 MED ORDER — HYDROCODONE-ACETAMINOPHEN 7.5-325 MG PO TABS: 1.0000 | ORAL_TABLET | Freq: Four times a day (QID) | ORAL | 0 refills | Status: DC | PRN

## 2021-05-27 MED ORDER — ALBUTEROL SULFATE HFA 108 (90 BASE) MCG/ACT IN AERS: 2.0000 | INHALATION_SPRAY | Freq: Four times a day (QID) | RESPIRATORY_TRACT | 2 refills | Status: DC | PRN

## 2021-05-27 NOTE — Progress Notes (Signed)
Assessment & Plan:  1-3. Chronic midline thoracic back pain/Arthritis/Controlled substance agreement signed Well controlled on current regimen when she takes it. Encouraged to take more frequently as prescribed to treat pain and not only at night. Controlled substance agreement and blood drug screen updated today. Patient is unable to do a urine drug screen due to ESRD on dialysis. PDMP reviewed with no concerning findings. Only one new prescription sent today since she still has two more at the pharmacy from our last visit.  - HYDROcodone-acetaminophen ([place]) 7.5-325 MG tablet; Take 1 tablet by mouth every 6 (six) hours as needed for moderate pain.  Dispense: 90 tablet; Refill: 0 - CBC with Differential/Platelet - CMP14+EGFR - Drug Screen 10 W/Conf, Se  4. Essential hypertension Well controlled on current regimen.  - CBC with Differential/Platelet - CMP14+EGFR  5. PAF (paroxysmal atrial fibrillation) (HCC) No longer on anticoagulation as risk outweigh benefits. Rate controlled. - CBC with Differential/Platelet - CMP14+EGFR  6. Dyslipidemia Well controlled on current regimen.  - CMP14+EGFR  7. CKD (chronic kidney disease) requiring chronic dialysis [facility]) Managed by nephrology. - Bayer DCA Hb A1c Waived  8. Vitamin D deficiency Managed by nephrology.  9. Anemia in chronic kidney disease, on chronic dialysis [facility]) Managed by nephrology.  10. COPD without exacerbation ([place]) Well controlled on current regimen.  - albuterol (VENTOLIN HFA) 108 (90 Base) MCG/ACT inhaler; Inhale 2 puffs into the lungs every 6 (six) hours as needed for wheezing or shortness of breath.  Dispense: 18 g; Refill: 2  11. Age-related osteoporosis without current pathological fracture Continue Fosamax. DEXA repeated today. - DG [facility] DEXA  12. Anxiety Well controlled on current regimen.  - CMP14+EGFR - citalopram (CELEXA) 40 MG tablet; Take 1 tablet (40 mg total) by mouth daily.  Dispense: 90 tablet;  Refill: 1  13. Recurrent major depressive disorder, in full remission ([place]) Well controlled on current regimen.  - CMP14+EGFR - citalopram (CELEXA) 40 MG tablet; Take 1 tablet (40 mg total) by mouth daily.  Dispense: 90 tablet; Refill: 1  14. Acquired hypothyroidism Well controlled on current regimen.   15. Restless leg syndrome Well controlled on current regimen.  - CMP14+EGFR  16. Gastroesophageal reflux disease without esophagitis Well controlled on current regimen.  - CMP14+EGFR  17. Immunization due - Varicella-zoster vaccine IM (Shingrix)   Return in about 3 months (around 08/24/2021) for follow-up of chronic medication conditions.  [name], MSN, APRN, FNP-C [facility]  Subjective:    Patient ID: [name], female    DOB: Nov 04, 1940, 81 y.o.   MRN: [id number]  Patient Care Team: [name], FNP as PCP - General (Family Medicine) Minus Breeding, MD as Consulting Physician (Cardiology) Tanda Rockers, MD as Consulting Physician (Pulmonary Disease) Jamal Maes, MD as Consulting Physician (Nephrology) Steffanie Rainwater, DPM as Consulting Physician (Podiatry) Rosita Fire, MD as Consulting Physician (Nephrology)   Chief Complaint:  Chief Complaint  Patient presents with   Medical Management of Chronic Issues    6 week chronic follow up     HPI: [name] is a 81 y.o. female presenting on 05/27/2021 for Medical Management of Chronic Issues (6 week chronic follow up )  Patient is accompanied by her daughter, [name], who she is okay with being present.  Pain assessment: Cause of pain- arthritis, multiple recent fractures Pain location- back, knees, hips Pain on scale of 1-10- 6/10 Frequency- daily What increases pain- activity What makes pain better- medication Effects on ADL-  unable to complete ADLs without assistance Any change in general medical condition- no  Current opioids rx- Norco 7.5/325 PO every 6 hours  as needed. This was increased three months ago as patient was not getting very long relief with 5/325. She reports the relief is longer with the increased dose. Her daughter-in-law, that she normally lives with, handles her medications and has only been giving her one Norco a day at night so she can rest without the pain. Patient states she has one or two tablets left from the prescription she received three months ago.  # meds rx- 90 Effectiveness of current meds- effective  Adverse reactions from pain meds- none Morphine equivalent- 30 MME/day  Pill count performed-No Last drug screen - 05/19/2020 - updated today (05/27/2021) ( high risk q32m moderate risk q635mlow risk yearly ) Drug screen today- Yes Was the [place]- Yes If yes were their any concerning findings? - No  Overdose risk: 110  Opioid Risk  09/04/2019  Alcohol 0  Illegal Drugs 0  Rx Drugs 0  Alcohol 0  Illegal Drugs 0  Rx Drugs 0  Age between 16-45 years  0  History of Preadolescent Sexual Abuse 0  Psychological Disease 0  Depression 0  Opioid Risk Tool Scoring 0  Opioid Risk Interpretation Low Risk   Pain contract signed on: 05/19/2020 - updated today (05/27/2021)   Hypertension: Hydralazine was increased from BID to TID three months ago. Patient has not been at her normal home with her son and daughter-in-law, who normally monitor her blood pressure at home.   Dyslipidemia: taking Zetia daily.   Atrial Fibrillation: not on anticoagulation due to history of GI bleeding and high risk of falling.   Hypothyroidism: taking levothyroxine 125 mcg daily. Last TSH WNL in January 2023 (last month).   COPD: uses Symbicort twice daily and has Albuterol to use as needed.  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 Patient reports they recently started giving her vitamin D during dialysis and discontinued oral vitamin D.   GERD: controlled with famotidine and pantoprazole.  Restless leg  syndrome: doing well with Mirapex.   Osteoporosis: taking Fosamax weekly. Last DEXA completed in February 2021; patient is agreeable in completing today.   Anxiety/Depression: Celexa was increased from 20 mg to 40 mg at our last visit, which is working well for the patient.  Depression screen [facility]/9 05/27/2021 04/15/2021 02/23/2021  Decreased Interest _0 Down, Depressed, Hopeless 0 0 1  PHQ - 2 Score _1 Altered sleeping _2 Tired, decreased energy _3 Change in appetite 0 1 0  Feeling bad or failure about yourself  1 0 2  Trouble concentrating 0 0 0  Moving slowly or fidgety/restless 0 0 0  Suicidal thoughts 0 0 0  PHQ-9 Score _4 Difficult doing work/chores Not difficult at all Somewhat difficult Somewhat difficult  Some recent data might be hidden   GAD 7 : Generalized Anxiety Score 05/27/2021 04/15/2021 02/23/2021 11/19/2020  Nervous, Anxious, on Edge 0 _5 Control/stop worrying 0 0 3 3  Worry too much - different things 0 0 3 3  Trouble relaxing 1 0 2 2  Restless 0 0 1 0  Easily annoyed or irritable _6 Afraid - awful might happen 1 0 2 2  Total GAD 7 Score _7 Anxiety Difficulty Not difficult at all  Not difficult at all Somewhat difficult Very difficult    New complaints: None   Social history:  Relevant past medical, surgical, family and social history reviewed and updated as indicated. Interim medical history since our last visit reviewed.  Allergies and medications reviewed and updated.  DATA REVIEWED: CHART IN EPIC  ROS: Negative unless specifically indicated above in HPI.    Current Outpatient Medications:    acetaminophen (TYLENOL) 325 MG tablet, Take 2 tablets (650 mg total) by mouth every 6 (six) hours as needed for mild pain, fever or headache., Disp: , Rfl:    albuterol (VENTOLIN HFA) 108 (90 Base) MCG/ACT inhaler, Inhale 2 puffs into the lungs every 6 (six) hours as needed for wheezing or shortness of breath., Disp: 18 g,  Rfl: 2   alendronate (FOSAMAX) 70 MG tablet, Take 1 tablet (70 mg total) by mouth every Thursday., Disp: 12 tablet, Rfl: 3   AURYXIA 1 GM 210 MG(Fe) tablet, Take 420 mg by mouth 3 (three) times daily with meals., Disp: , Rfl:    budesonide-formoterol (SYMBICORT) 80-4.5 MCG/ACT inhaler, Inhale 2 puffs into the lungs 2 (two) times daily., Disp: 3 each, Rfl: 4   cinacalcet (SENSIPAR) 30 MG tablet, Take 1 tablet (30 mg total) by mouth every Monday, Wednesday, and Friday., Disp: 60 tablet, Rfl: 0   citalopram (CELEXA) 40 MG tablet, TAKE 1 TABLET BY MOUTH EVERY DAY (Patient taking differently: Take 40 mg by mouth daily.), Disp: 90 tablet, Rfl: 0   docusate calcium (SURFAK) 240 MG capsule, Take 1 capsule (240 mg total) by mouth daily., Disp: 90 capsule, Rfl: 1   doxercalciferol (HECTOROL) 4 MCG/2ML injection, Inject 0.5 mLs (1 mcg total) into the vein every Monday, Wednesday, and Friday with hemodialysis., Disp: 2 mL, Rfl: 0   ezetimibe (ZETIA) 10 MG tablet, TAKE 1 TABLET BY MOUTH EVERY DAY, Disp: 90 tablet, Rfl: 3   famotidine (PEPCID) 10 MG tablet, Take 1 tablet (10 mg total) by mouth daily., Disp: 90 tablet, Rfl: 1   hydrALAZINE (APRESOLINE) 10 MG tablet, TAKE 1 TABLET BY MOUTH THREE TIMES A DAY, Disp: 270 tablet, Rfl: 0   HYDROcodone-acetaminophen (NORCO) 7.5-325 MG tablet, Take 1 tablet by mouth every 6 (six) hours as needed for moderate pain., Disp: 90 tablet, Rfl: 0   levocetirizine (XYZAL) 5 MG tablet, TAKE 1 TABLET BY MOUTH EVERY DAY IN THE EVENING, Disp: 90 tablet, Rfl: 1   levothyroxine (SYNTHROID) 125 MCG tablet, TAKE 1 TABLET BY MOUTH EVERY DAY, Disp: 90 tablet, Rfl: 0   multivitamin (RENA-VIT) TABS tablet, Take 1 tablet by mouth daily., Disp: , Rfl:    nystatin cream (MYCOSTATIN), Apply 1 application topically 2 (two) times daily as needed for dry skin., Disp: 30 g, Rfl: 2   pantoprazole (PROTONIX) 20 MG tablet, Take 1 tablet (20 mg total) by mouth daily., Disp: 90 tablet, Rfl: 1    pramipexole (MIRAPEX) 0.125 MG tablet, Take 1 tablet (0.125 mg total) by mouth at bedtime., Disp: 90 tablet, Rfl: 1   Allergies  Allergen Reactions   Amoxicillin Rash    Has patient had a PCN reaction causing immediate rash, facial/tongue/throat swelling, SOB or lightheadedness with hypotension:YES Has patient had a PCN reaction causing severe rash involving mucus membranes or skin necrosis: Yes Has patient had a PCN reaction that required hospitalization No Has patient had a PCN reaction occurring within the last 10 years: Yes If all of the above answers are "NO", then may proceed with Cephalosporin use. Cephalosporins ok  Elemental Sulfur Hives   Sulfur Hives   Peg 3350-Electrolytes Other (See Comments)   Sulfa Drugs Cross Reactors Hives and Itching   Miralax [Polyethylene Glycol] Itching   Mixed Grasses    Percocet [Oxycodone-Acetaminophen] Itching and Rash    Did not happen last time she took it   Sulfa Antibiotics Hives   Past Medical History:  Diagnosis Date   Acute respiratory failure ([place]) 05/2017   Anemia    Anxiety    Arthritis    COPD (chronic obstructive pulmonary disease) (HCC)    Depression    ESRD (end stage renal disease) (HCC)    dialysis MWF   GERD (gastroesophageal reflux disease)    Gout    History of blood transfusion    History of diabetes mellitus    "diet controlled"   HLD (hyperlipidemia)    HOH (hard of hearing)    left ear   Hypertension    hypotensive -since starting dialysis   Hypothyroidism    MRSA (methicillin resistant staph aureus) culture positive 06/01/2017   PAF (paroxysmal atrial fibrillation) ([place])    a. Echo 11/16:  Mild LVH, EF 55-60%, normal wall motion, MAC, mild MR, severe LAE (49 ml/m2), mild RVE, normal RVSF, mild RAE, mild TR, PASP 24 mmHg;  CHADS2-VASc: 4 >> Coumadin followed by PCP    Past Surgical History:  Procedure Laterality Date   AV FISTULA PLACEMENT  04/03/2012   Procedure: ARTERIOVENOUS (AV) FISTULA CREATION;   Surgeon: Elam Dutch, MD;  Location: [place];  Service: Vascular;  Laterality: Left;  creation left brachial cephalic fistula    BIOPSY  08/21/2018   Procedure: BIOPSY;  Surgeon: Thornton Park, MD;  Location: [place];  Service: Gastroenterology;;   CARPAL TUNNEL RELEASE Bilateral    COLONOSCOPY W/ POLYPECTOMY     COLONOSCOPY WITH PROPOFOL N/A 08/21/2018   Procedure: COLONOSCOPY WITH PROPOFOL;  Surgeon: Thornton Park, MD;  Location: [facility];  Service: Gastroenterology;  Laterality: N/A;   DILATION AND CURETTAGE OF UTERUS     ESOPHAGOGASTRODUODENOSCOPY (EGD) WITH PROPOFOL N/A 08/21/2018   Procedure: ESOPHAGOGASTRODUODENOSCOPY (EGD) WITH PROPOFOL;  Surgeon: Thornton Park, MD;  Location: [place];  Service: Gastroenterology;  Laterality: N/A;   EYE SURGERY Bilateral    bilateral cataract removal   HEMATOMA EVACUATION Left 05/14/2018   Procedure: Incision and Drainage of Left Arm Hematoma;  Surgeon: Elam Dutch, MD;  Location: [place];  Service: Vascular;  Laterality: Left;   Hemodialysis  catheter Right    IR FLUORO GUIDE CV LINE RIGHT  05/26/2017   IR FLUORO GUIDE CV LINE RIGHT  04/10/2021   IR REMOVAL TUN CV CATH W/O FL  05/24/2017   IR REMOVAL TUN CV CATH W/O FL  04/10/2021   IR US GUIDE VASC ACCESS RIGHT  05/26/2017   IR US GUIDE VASC ACCESS RIGHT  04/10/2021   LIGATION OF ARTERIOVENOUS  FISTULA Left 02/04/2015   Procedure: LIGATION OF BRACHIOCEPHALIC ARTERIOVENOUS  FISTULA;  Surgeon: Conrad Glen Lyon, MD;  Location: [place];  Service: Vascular;  Laterality: Left;   MULTIPLE TOOTH EXTRACTIONS     PARTIAL HIP ARTHROPLASTY     POLYPECTOMY  08/21/2018   Procedure: POLYPECTOMY;  Surgeon: Thornton Park, MD;  Location: [place];  Service: Gastroenterology;;   PORTACATH PLACEMENT     REVERSE SHOULDER ARTHROPLASTY Left 01/22/2016   Procedure: LEFT REVERSE SHOULDER ARTHROPLASTY;  Surgeon: Netta Cedars, MD;  Location: [place];  Service: Orthopedics;  Laterality: Left;   SHOULDER  ARTHROSCOPY Bilateral  SPLIT NIGHT STUDY  07/26/2015   STERIOD INJECTION Right 01/22/2016   Procedure: RIGHT RING FINGER STEROID INJECTION;  Surgeon: Netta Cedars, MD;  Location: [place];  Service: Orthopedics;  Laterality: Right;   TEE WITHOUT CARDIOVERSION N/A 05/26/2017   Procedure: TRANSESOPHAGEAL ECHOCARDIOGRAM (TEE);  Surgeon: Lelon Perla, MD;  Location: [place];  Service: Cardiovascular;  Laterality: N/A;   THROMBECTOMY BRACHIAL ARTERY Left 02/06/2015   Procedure: EVACUATION OF LEFT ARM HEMATOMA;  Surgeon: Angelia Mould, MD;  Location: [place];  Service: Vascular;  Laterality: Left;   TOTAL KNEE ARTHROPLASTY Bilateral    TUBAL LIGATION      Social History   Socioeconomic History   Marital status: Widowed    Spouse name: Not on file   Number of children: 6   Years of education: 10   Highest education level: 10th grade  Occupational History   Occupation: RETIRED  Tobacco Use   Smoking status: Former    Packs/day: 0.25    Years: 2.00    Pack years: 0.50    Types: Cigarettes    Quit date: 04/04/1998    Years since quitting: 23.1   Smokeless tobacco: Never  Vaping Use   Vaping Use: Never used  Substance and Sexual Activity   Alcohol use: No    Alcohol/week: 0.0 standard drinks   Drug use: No   Sexual activity: Not Currently    Birth control/protection: None  Other Topics Concern   Not on file  Social History Narrative   ** Merged History Encounter ** Widowed   6 children   Lives with son and daughter in [place]   ESRD - dialysis 3x per week   Cognitive impairment   Social Determinants of Health   Financial Resource Strain: Low Risk    Difficulty of Paying Living Expenses: Not hard at all  Food Insecurity: No Food Insecurity   Worried About Charity fundraiser in the Last Year: Never true   Arboriculturist in the Last Year: Never true  Transportation Needs: No Transportation Needs   Lack of Transportation (Medical): No   Lack of  Transportation (Non-Medical): No  Physical Activity: Inactive   Days of Exercise per Week: 0 days   Minutes of Exercise per Session: 0 min  Stress: No Stress Concern Present   Feeling of Stress : Only a little  Social Connections: Moderately Isolated   Frequency of Communication with Friends and Family: More than three times a week   Frequency of Social Gatherings with Friends and Family: More than three times a week   Attends Religious Services: 1 to 4 times per year   Active Member of [employer] or Organizations: No   Attends Archivist Meetings: Never   Marital Status: Widowed  Human resources officer Violence: Not At Risk   Fear of Current or Ex-Partner: No   Emotionally Abused: No   Physically Abused: No   Sexually Abused: No        Objective:    BP 132/82    Pulse 76    Temp 98.3 F (36.8 C) (Temporal)    SpO2 96%   Wt Readings from Last 3 Encounters:  04/28/21 110 lb (49.9 kg)  04/15/21 113 lb 8 oz (51.5 kg)  04/10/21 114 lb 3.2 oz (51.8 kg)    Physical Exam Vitals reviewed.  Constitutional:      General: She is not in acute distress.    Appearance: Normal appearance. She is not  ill-appearing, toxic-appearing or diaphoretic.  HENT:     Head: Normocephalic and atraumatic.  Eyes:     General: No scleral icterus.       Right eye: No discharge.        Left eye: No discharge.     Conjunctiva/sclera: Conjunctivae normal.  Cardiovascular:     Rate and Rhythm: Normal rate. Rhythm irregular.     Heart sounds: Normal heart sounds. No murmur heard.   No friction rub. No gallop.  Pulmonary:     Effort: Pulmonary effort is normal. No respiratory distress.     Breath sounds: Normal breath sounds. No stridor. No wheezing, rhonchi or rales.  Musculoskeletal:        General: Normal range of motion.     Cervical back: Normal range of motion.  Skin:    General: Skin is warm and dry.     Capillary Refill: Capillary refill takes less than 2 seconds.  Neurological:      General: No focal deficit present.     Mental Status: She is alert and oriented to person, place, and time. Mental status is at baseline.     Gait: Gait abnormal (riding in [facility]).  Psychiatric:        Mood and Affect: Mood normal.        Behavior: Behavior normal.        Thought Content: Thought content normal.        Judgment: Judgment normal.    Lab Results  Component Value Date   TSH 2.703 04/05/2021   Lab Results  Component Value Date   WBC 14.7 (H) 04/15/2021   HGB 12.4 04/15/2021   HCT 37.2 04/15/2021   MCV 92 04/15/2021   PLT 324 04/15/2021   Lab Results  Component Value Date   NA 143 04/15/2021   K 4.4 04/15/2021   CO2 27 04/15/2021   GLUCOSE 77 04/15/2021   BUN 20 04/15/2021   CREATININE 3.96 (HH) 04/15/2021   BILITOT 0.7 04/10/2021   ALKPHOS 80 04/10/2021   AST 19 04/10/2021   ALT 15 04/10/2021   PROT 5.5 (L) 04/10/2021   ALBUMIN 2.3 (L) 04/10/2021   CALCIUM 8.8 04/15/2021   ANIONGAP 11 04/10/2021   EGFR 11 (L) 04/15/2021   Lab Results  Component Value Date   CHOL 144 02/23/2021   Lab Results  Component Value Date   HDL 68 02/23/2021   Lab Results  Component Value Date   LDLCALC 62 02/23/2021   Lab Results  Component Value Date   TRIG 41 04/05/2021   Lab Results  Component Value Date   CHOLHDL 2.1 02/23/2021   Lab Results  Component Value Date   HGBA1C 4.5 (L) 05/25/2017

## 2021-05-28 DIAGNOSIS — N2581 Secondary hyperparathyroidism of renal origin: Secondary | ICD-10-CM | POA: Diagnosis not present

## 2021-05-28 DIAGNOSIS — D631 Anemia in chronic kidney disease: Secondary | ICD-10-CM | POA: Diagnosis not present

## 2021-05-28 DIAGNOSIS — Z992 Dependence on renal dialysis: Secondary | ICD-10-CM | POA: Diagnosis not present

## 2021-05-28 DIAGNOSIS — M810 Age-related osteoporosis without current pathological fracture: Secondary | ICD-10-CM | POA: Diagnosis not present

## 2021-05-28 DIAGNOSIS — Z4901 Encounter for fitting and adjustment of extracorporeal dialysis catheter: Secondary | ICD-10-CM | POA: Diagnosis not present

## 2021-05-28 DIAGNOSIS — Z780 Asymptomatic menopausal state: Secondary | ICD-10-CM | POA: Diagnosis not present

## 2021-05-28 DIAGNOSIS — N186 End stage renal disease: Secondary | ICD-10-CM | POA: Diagnosis not present

## 2021-05-28 DIAGNOSIS — R519 Headache, unspecified: Secondary | ICD-10-CM | POA: Diagnosis not present

## 2021-05-28 DIAGNOSIS — D689 Coagulation defect, unspecified: Secondary | ICD-10-CM | POA: Diagnosis not present

## 2021-05-30 ENCOUNTER — Encounter (HOSPITAL_COMMUNITY): Payer: Self-pay | Admitting: *Deleted

## 2021-05-30 DIAGNOSIS — I480 Paroxysmal atrial fibrillation: Secondary | ICD-10-CM | POA: Diagnosis not present

## 2021-05-30 MED ORDER — CITALOPRAM HYDROBROMIDE 40 MG PO TABS: 40.0000 mg | ORAL_TABLET | Freq: Every day | ORAL | 1 refills | Status: DC

## 2021-05-31 DIAGNOSIS — Z4901 Encounter for fitting and adjustment of extracorporeal dialysis catheter: Secondary | ICD-10-CM | POA: Diagnosis not present

## 2021-05-31 DIAGNOSIS — N2581 Secondary hyperparathyroidism of renal origin: Secondary | ICD-10-CM | POA: Diagnosis not present

## 2021-05-31 DIAGNOSIS — D631 Anemia in chronic kidney disease: Secondary | ICD-10-CM | POA: Diagnosis not present

## 2021-05-31 DIAGNOSIS — D689 Coagulation defect, unspecified: Secondary | ICD-10-CM | POA: Diagnosis not present

## 2021-05-31 DIAGNOSIS — N186 End stage renal disease: Secondary | ICD-10-CM | POA: Diagnosis not present

## 2021-05-31 DIAGNOSIS — Z992 Dependence on renal dialysis: Secondary | ICD-10-CM | POA: Diagnosis not present

## 2021-05-31 DIAGNOSIS — R519 Headache, unspecified: Secondary | ICD-10-CM | POA: Diagnosis not present

## 2021-06-01 ENCOUNTER — Other Ambulatory Visit: Payer: Self-pay | Admitting: Family Medicine

## 2021-06-01 DIAGNOSIS — Z992 Dependence on renal dialysis: Secondary | ICD-10-CM | POA: Diagnosis not present

## 2021-06-01 DIAGNOSIS — N186 End stage renal disease: Secondary | ICD-10-CM | POA: Diagnosis not present

## 2021-06-01 DIAGNOSIS — E1122 Type 2 diabetes mellitus with diabetic chronic kidney disease: Secondary | ICD-10-CM | POA: Diagnosis not present

## 2021-06-02 DIAGNOSIS — N2581 Secondary hyperparathyroidism of renal origin: Secondary | ICD-10-CM | POA: Diagnosis not present

## 2021-06-02 DIAGNOSIS — D689 Coagulation defect, unspecified: Secondary | ICD-10-CM | POA: Diagnosis not present

## 2021-06-02 DIAGNOSIS — R519 Headache, unspecified: Secondary | ICD-10-CM | POA: Diagnosis not present

## 2021-06-02 DIAGNOSIS — D509 Iron deficiency anemia, unspecified: Secondary | ICD-10-CM | POA: Diagnosis not present

## 2021-06-02 DIAGNOSIS — D631 Anemia in chronic kidney disease: Secondary | ICD-10-CM | POA: Diagnosis not present

## 2021-06-02 DIAGNOSIS — N186 End stage renal disease: Secondary | ICD-10-CM | POA: Diagnosis not present

## 2021-06-02 DIAGNOSIS — Z4901 Encounter for fitting and adjustment of extracorporeal dialysis catheter: Secondary | ICD-10-CM | POA: Diagnosis not present

## 2021-06-02 DIAGNOSIS — Z992 Dependence on renal dialysis: Secondary | ICD-10-CM | POA: Diagnosis not present

## 2021-06-03 LAB — CBC WITH DIFFERENTIAL/PLATELET
Basophils Absolute: 0.1 10*3/uL (ref 0.0–0.2)
Basos: 1 %
EOS (ABSOLUTE): 0.3 10*3/uL (ref 0.0–0.4)
Eos: 3 %
Hematocrit: 35.4 % (ref 34.0–46.6)
Hemoglobin: 11.5 g/dL (ref 11.1–15.9)
Immature Grans (Abs): 0.1 10*3/uL (ref 0.0–0.1)
Immature Granulocytes: 1 %
Lymphocytes Absolute: 1 10*3/uL (ref 0.7–3.1)
Lymphs: 11 %
MCH: 30 pg (ref 26.6–33.0)
MCHC: 32.5 g/dL (ref 31.5–35.7)
MCV: 92 fL (ref 79–97)
Monocytes Absolute: 0.8 10*3/uL (ref 0.1–0.9)
Monocytes: 9 %
Neutrophils Absolute: 7.1 10*3/uL — ABNORMAL HIGH (ref 1.4–7.0)
Neutrophils: 75 %
Platelets: 317 10*3/uL (ref 150–450)
RBC: 3.83 x10E6/uL (ref 3.77–5.28)
RDW: 13.6 % (ref 11.7–15.4)
WBC: 9.4 10*3/uL (ref 3.4–10.8)

## 2021-06-03 LAB — CMP14+EGFR
ALT: 33 IU/L — ABNORMAL HIGH (ref 0–32)
AST: 39 IU/L (ref 0–40)
Albumin/Globulin Ratio: 1.4 (ref 1.2–2.2)
Albumin: 3.7 g/dL (ref 3.6–4.6)
Alkaline Phosphatase: 101 IU/L (ref 44–121)
BUN/Creatinine Ratio: 6 — ABNORMAL LOW (ref 12–28)
BUN: 26 mg/dL (ref 8–27)
Bilirubin Total: 0.4 mg/dL (ref 0.0–1.2)
CO2: 28 mmol/L (ref 20–29)
Calcium: 8.6 mg/dL — ABNORMAL LOW (ref 8.7–10.3)
Chloride: 101 mmol/L (ref 96–106)
Creatinine, Ser: 4.16 mg/dL (ref 0.57–1.00)
Globulin, Total: 2.7 g/dL (ref 1.5–4.5)
Glucose: 73 mg/dL (ref 70–99)
Potassium: 4.2 mmol/L (ref 3.5–5.2)
Sodium: 145 mmol/L — ABNORMAL HIGH (ref 134–144)
Total Protein: 6.4 g/dL (ref 6.0–8.5)
eGFR: 10 mL/min/{1.73_m2} — ABNORMAL LOW (ref >59)

## 2021-06-03 LAB — DRUG SCREEN 10 W/CONF, SERUM
Amphetamines, IA: NEGATIVE ng/mL
Barbiturates, IA: NEGATIVE ug/mL
Benzodiazepines, IA: NEGATIVE ng/mL
Cocaine & Metabolite, IA: NEGATIVE ng/mL
Methadone, IA: NEGATIVE ng/mL
Opiates, IA: NEGATIVE ng/mL
Oxycodones, IA: NEGATIVE ng/mL
Phencyclidine, IA: NEGATIVE ng/mL
Propoxyphene, IA: NEGATIVE ng/mL
THC(Marijuana) Metabolite, IA: NEGATIVE ng/mL

## 2021-06-04 DIAGNOSIS — D509 Iron deficiency anemia, unspecified: Secondary | ICD-10-CM | POA: Diagnosis not present

## 2021-06-04 DIAGNOSIS — D631 Anemia in chronic kidney disease: Secondary | ICD-10-CM | POA: Diagnosis not present

## 2021-06-04 DIAGNOSIS — R519 Headache, unspecified: Secondary | ICD-10-CM | POA: Diagnosis not present

## 2021-06-04 DIAGNOSIS — D689 Coagulation defect, unspecified: Secondary | ICD-10-CM | POA: Diagnosis not present

## 2021-06-04 DIAGNOSIS — Z4901 Encounter for fitting and adjustment of extracorporeal dialysis catheter: Secondary | ICD-10-CM | POA: Diagnosis not present

## 2021-06-04 DIAGNOSIS — N186 End stage renal disease: Secondary | ICD-10-CM | POA: Diagnosis not present

## 2021-06-04 DIAGNOSIS — N2581 Secondary hyperparathyroidism of renal origin: Secondary | ICD-10-CM | POA: Diagnosis not present

## 2021-06-04 DIAGNOSIS — Z992 Dependence on renal dialysis: Secondary | ICD-10-CM | POA: Diagnosis not present

## 2021-06-07 DIAGNOSIS — D631 Anemia in chronic kidney disease: Secondary | ICD-10-CM | POA: Diagnosis not present

## 2021-06-07 DIAGNOSIS — Z992 Dependence on renal dialysis: Secondary | ICD-10-CM | POA: Diagnosis not present

## 2021-06-07 DIAGNOSIS — N186 End stage renal disease: Secondary | ICD-10-CM | POA: Diagnosis not present

## 2021-06-07 DIAGNOSIS — D509 Iron deficiency anemia, unspecified: Secondary | ICD-10-CM | POA: Diagnosis not present

## 2021-06-07 DIAGNOSIS — N2581 Secondary hyperparathyroidism of renal origin: Secondary | ICD-10-CM | POA: Diagnosis not present

## 2021-06-07 DIAGNOSIS — Z4901 Encounter for fitting and adjustment of extracorporeal dialysis catheter: Secondary | ICD-10-CM | POA: Diagnosis not present

## 2021-06-07 DIAGNOSIS — R519 Headache, unspecified: Secondary | ICD-10-CM | POA: Diagnosis not present

## 2021-06-07 DIAGNOSIS — D689 Coagulation defect, unspecified: Secondary | ICD-10-CM | POA: Diagnosis not present

## 2021-06-09 DIAGNOSIS — N186 End stage renal disease: Secondary | ICD-10-CM | POA: Diagnosis not present

## 2021-06-09 DIAGNOSIS — N2581 Secondary hyperparathyroidism of renal origin: Secondary | ICD-10-CM | POA: Diagnosis not present

## 2021-06-09 DIAGNOSIS — Z4901 Encounter for fitting and adjustment of extracorporeal dialysis catheter: Secondary | ICD-10-CM | POA: Diagnosis not present

## 2021-06-09 DIAGNOSIS — D509 Iron deficiency anemia, unspecified: Secondary | ICD-10-CM | POA: Diagnosis not present

## 2021-06-09 DIAGNOSIS — R519 Headache, unspecified: Secondary | ICD-10-CM | POA: Diagnosis not present

## 2021-06-09 DIAGNOSIS — D689 Coagulation defect, unspecified: Secondary | ICD-10-CM | POA: Diagnosis not present

## 2021-06-09 DIAGNOSIS — Z992 Dependence on renal dialysis: Secondary | ICD-10-CM | POA: Diagnosis not present

## 2021-06-09 DIAGNOSIS — D631 Anemia in chronic kidney disease: Secondary | ICD-10-CM | POA: Diagnosis not present

## 2021-06-11 DIAGNOSIS — D689 Coagulation defect, unspecified: Secondary | ICD-10-CM | POA: Diagnosis not present

## 2021-06-11 DIAGNOSIS — N186 End stage renal disease: Secondary | ICD-10-CM | POA: Diagnosis not present

## 2021-06-11 DIAGNOSIS — D509 Iron deficiency anemia, unspecified: Secondary | ICD-10-CM | POA: Diagnosis not present

## 2021-06-11 DIAGNOSIS — R519 Headache, unspecified: Secondary | ICD-10-CM | POA: Diagnosis not present

## 2021-06-11 DIAGNOSIS — D631 Anemia in chronic kidney disease: Secondary | ICD-10-CM | POA: Diagnosis not present

## 2021-06-11 DIAGNOSIS — Z992 Dependence on renal dialysis: Secondary | ICD-10-CM | POA: Diagnosis not present

## 2021-06-11 DIAGNOSIS — N2581 Secondary hyperparathyroidism of renal origin: Secondary | ICD-10-CM | POA: Diagnosis not present

## 2021-06-11 DIAGNOSIS — Z4901 Encounter for fitting and adjustment of extracorporeal dialysis catheter: Secondary | ICD-10-CM | POA: Diagnosis not present

## 2021-06-14 DIAGNOSIS — Z4901 Encounter for fitting and adjustment of extracorporeal dialysis catheter: Secondary | ICD-10-CM | POA: Diagnosis not present

## 2021-06-14 DIAGNOSIS — Z992 Dependence on renal dialysis: Secondary | ICD-10-CM | POA: Diagnosis not present

## 2021-06-14 DIAGNOSIS — N2581 Secondary hyperparathyroidism of renal origin: Secondary | ICD-10-CM | POA: Diagnosis not present

## 2021-06-14 DIAGNOSIS — N186 End stage renal disease: Secondary | ICD-10-CM | POA: Diagnosis not present

## 2021-06-14 DIAGNOSIS — D689 Coagulation defect, unspecified: Secondary | ICD-10-CM | POA: Diagnosis not present

## 2021-06-14 DIAGNOSIS — R519 Headache, unspecified: Secondary | ICD-10-CM | POA: Diagnosis not present

## 2021-06-14 DIAGNOSIS — D509 Iron deficiency anemia, unspecified: Secondary | ICD-10-CM | POA: Diagnosis not present

## 2021-06-14 DIAGNOSIS — D631 Anemia in chronic kidney disease: Secondary | ICD-10-CM | POA: Diagnosis not present

## 2021-06-15 ENCOUNTER — Telehealth: Payer: Medicare Other

## 2021-06-16 DIAGNOSIS — Z4901 Encounter for fitting and adjustment of extracorporeal dialysis catheter: Secondary | ICD-10-CM | POA: Diagnosis not present

## 2021-06-16 DIAGNOSIS — N186 End stage renal disease: Secondary | ICD-10-CM | POA: Diagnosis not present

## 2021-06-16 DIAGNOSIS — D631 Anemia in chronic kidney disease: Secondary | ICD-10-CM | POA: Diagnosis not present

## 2021-06-16 DIAGNOSIS — N2581 Secondary hyperparathyroidism of renal origin: Secondary | ICD-10-CM | POA: Diagnosis not present

## 2021-06-16 DIAGNOSIS — D689 Coagulation defect, unspecified: Secondary | ICD-10-CM | POA: Diagnosis not present

## 2021-06-16 DIAGNOSIS — R519 Headache, unspecified: Secondary | ICD-10-CM | POA: Diagnosis not present

## 2021-06-16 DIAGNOSIS — D509 Iron deficiency anemia, unspecified: Secondary | ICD-10-CM | POA: Diagnosis not present

## 2021-06-16 DIAGNOSIS — Z992 Dependence on renal dialysis: Secondary | ICD-10-CM | POA: Diagnosis not present

## 2021-06-18 DIAGNOSIS — N186 End stage renal disease: Secondary | ICD-10-CM | POA: Diagnosis not present

## 2021-06-18 DIAGNOSIS — N2581 Secondary hyperparathyroidism of renal origin: Secondary | ICD-10-CM | POA: Diagnosis not present

## 2021-06-18 DIAGNOSIS — Z992 Dependence on renal dialysis: Secondary | ICD-10-CM | POA: Diagnosis not present

## 2021-06-18 DIAGNOSIS — D689 Coagulation defect, unspecified: Secondary | ICD-10-CM | POA: Diagnosis not present

## 2021-06-18 DIAGNOSIS — D509 Iron deficiency anemia, unspecified: Secondary | ICD-10-CM | POA: Diagnosis not present

## 2021-06-18 DIAGNOSIS — Z4901 Encounter for fitting and adjustment of extracorporeal dialysis catheter: Secondary | ICD-10-CM | POA: Diagnosis not present

## 2021-06-18 DIAGNOSIS — R519 Headache, unspecified: Secondary | ICD-10-CM | POA: Diagnosis not present

## 2021-06-18 DIAGNOSIS — D631 Anemia in chronic kidney disease: Secondary | ICD-10-CM | POA: Diagnosis not present

## 2021-06-21 DIAGNOSIS — Z992 Dependence on renal dialysis: Secondary | ICD-10-CM | POA: Diagnosis not present

## 2021-06-21 DIAGNOSIS — R519 Headache, unspecified: Secondary | ICD-10-CM | POA: Diagnosis not present

## 2021-06-21 DIAGNOSIS — N2581 Secondary hyperparathyroidism of renal origin: Secondary | ICD-10-CM | POA: Diagnosis not present

## 2021-06-21 DIAGNOSIS — D689 Coagulation defect, unspecified: Secondary | ICD-10-CM | POA: Diagnosis not present

## 2021-06-21 DIAGNOSIS — Z4901 Encounter for fitting and adjustment of extracorporeal dialysis catheter: Secondary | ICD-10-CM | POA: Diagnosis not present

## 2021-06-21 DIAGNOSIS — N186 End stage renal disease: Secondary | ICD-10-CM | POA: Diagnosis not present

## 2021-06-21 DIAGNOSIS — D509 Iron deficiency anemia, unspecified: Secondary | ICD-10-CM | POA: Diagnosis not present

## 2021-06-21 DIAGNOSIS — D631 Anemia in chronic kidney disease: Secondary | ICD-10-CM | POA: Diagnosis not present

## 2021-06-23 DIAGNOSIS — D509 Iron deficiency anemia, unspecified: Secondary | ICD-10-CM | POA: Diagnosis not present

## 2021-06-23 DIAGNOSIS — N186 End stage renal disease: Secondary | ICD-10-CM | POA: Diagnosis not present

## 2021-06-23 DIAGNOSIS — Z992 Dependence on renal dialysis: Secondary | ICD-10-CM | POA: Diagnosis not present

## 2021-06-23 DIAGNOSIS — R519 Headache, unspecified: Secondary | ICD-10-CM | POA: Diagnosis not present

## 2021-06-23 DIAGNOSIS — D689 Coagulation defect, unspecified: Secondary | ICD-10-CM | POA: Diagnosis not present

## 2021-06-23 DIAGNOSIS — D631 Anemia in chronic kidney disease: Secondary | ICD-10-CM | POA: Diagnosis not present

## 2021-06-23 DIAGNOSIS — N2581 Secondary hyperparathyroidism of renal origin: Secondary | ICD-10-CM | POA: Diagnosis not present

## 2021-06-23 DIAGNOSIS — Z4901 Encounter for fitting and adjustment of extracorporeal dialysis catheter: Secondary | ICD-10-CM | POA: Diagnosis not present

## 2021-06-24 ENCOUNTER — Other Ambulatory Visit: Payer: Self-pay | Admitting: Family Medicine

## 2021-06-24 DIAGNOSIS — E039 Hypothyroidism, unspecified: Secondary | ICD-10-CM

## 2021-06-24 DIAGNOSIS — G8929 Other chronic pain: Secondary | ICD-10-CM

## 2021-06-24 DIAGNOSIS — J449 Chronic obstructive pulmonary disease, unspecified: Secondary | ICD-10-CM

## 2021-06-25 DIAGNOSIS — N186 End stage renal disease: Secondary | ICD-10-CM | POA: Diagnosis not present

## 2021-06-25 DIAGNOSIS — R519 Headache, unspecified: Secondary | ICD-10-CM | POA: Diagnosis not present

## 2021-06-25 DIAGNOSIS — D631 Anemia in chronic kidney disease: Secondary | ICD-10-CM | POA: Diagnosis not present

## 2021-06-25 DIAGNOSIS — D509 Iron deficiency anemia, unspecified: Secondary | ICD-10-CM | POA: Diagnosis not present

## 2021-06-25 DIAGNOSIS — N2581 Secondary hyperparathyroidism of renal origin: Secondary | ICD-10-CM | POA: Diagnosis not present

## 2021-06-25 DIAGNOSIS — Z4901 Encounter for fitting and adjustment of extracorporeal dialysis catheter: Secondary | ICD-10-CM | POA: Diagnosis not present

## 2021-06-25 DIAGNOSIS — Z992 Dependence on renal dialysis: Secondary | ICD-10-CM | POA: Diagnosis not present

## 2021-06-25 DIAGNOSIS — D689 Coagulation defect, unspecified: Secondary | ICD-10-CM | POA: Diagnosis not present

## 2021-06-27 DIAGNOSIS — I480 Paroxysmal atrial fibrillation: Secondary | ICD-10-CM | POA: Diagnosis not present

## 2021-06-28 DIAGNOSIS — D689 Coagulation defect, unspecified: Secondary | ICD-10-CM | POA: Diagnosis not present

## 2021-06-28 DIAGNOSIS — Z992 Dependence on renal dialysis: Secondary | ICD-10-CM | POA: Diagnosis not present

## 2021-06-28 DIAGNOSIS — N2581 Secondary hyperparathyroidism of renal origin: Secondary | ICD-10-CM | POA: Diagnosis not present

## 2021-06-28 DIAGNOSIS — N186 End stage renal disease: Secondary | ICD-10-CM | POA: Diagnosis not present

## 2021-06-28 DIAGNOSIS — D631 Anemia in chronic kidney disease: Secondary | ICD-10-CM | POA: Diagnosis not present

## 2021-06-28 DIAGNOSIS — Z4901 Encounter for fitting and adjustment of extracorporeal dialysis catheter: Secondary | ICD-10-CM | POA: Diagnosis not present

## 2021-06-28 DIAGNOSIS — D509 Iron deficiency anemia, unspecified: Secondary | ICD-10-CM | POA: Diagnosis not present

## 2021-06-28 DIAGNOSIS — R519 Headache, unspecified: Secondary | ICD-10-CM | POA: Diagnosis not present

## 2021-06-29 ENCOUNTER — Telehealth: Payer: Medicare Other

## 2021-06-30 DIAGNOSIS — D689 Coagulation defect, unspecified: Secondary | ICD-10-CM | POA: Diagnosis not present

## 2021-06-30 DIAGNOSIS — Z4901 Encounter for fitting and adjustment of extracorporeal dialysis catheter: Secondary | ICD-10-CM | POA: Diagnosis not present

## 2021-06-30 DIAGNOSIS — N2581 Secondary hyperparathyroidism of renal origin: Secondary | ICD-10-CM | POA: Diagnosis not present

## 2021-06-30 DIAGNOSIS — Z992 Dependence on renal dialysis: Secondary | ICD-10-CM | POA: Diagnosis not present

## 2021-06-30 DIAGNOSIS — D631 Anemia in chronic kidney disease: Secondary | ICD-10-CM | POA: Diagnosis not present

## 2021-06-30 DIAGNOSIS — R519 Headache, unspecified: Secondary | ICD-10-CM | POA: Diagnosis not present

## 2021-06-30 DIAGNOSIS — N186 End stage renal disease: Secondary | ICD-10-CM | POA: Diagnosis not present

## 2021-07-02 DIAGNOSIS — D631 Anemia in chronic kidney disease: Secondary | ICD-10-CM | POA: Diagnosis not present

## 2021-07-02 DIAGNOSIS — D689 Coagulation defect, unspecified: Secondary | ICD-10-CM | POA: Diagnosis not present

## 2021-07-02 DIAGNOSIS — N2581 Secondary hyperparathyroidism of renal origin: Secondary | ICD-10-CM | POA: Diagnosis not present

## 2021-07-02 DIAGNOSIS — N186 End stage renal disease: Secondary | ICD-10-CM | POA: Diagnosis not present

## 2021-07-02 DIAGNOSIS — Z992 Dependence on renal dialysis: Secondary | ICD-10-CM | POA: Diagnosis not present

## 2021-07-02 DIAGNOSIS — Z4901 Encounter for fitting and adjustment of extracorporeal dialysis catheter: Secondary | ICD-10-CM | POA: Diagnosis not present

## 2021-07-02 DIAGNOSIS — R519 Headache, unspecified: Secondary | ICD-10-CM | POA: Diagnosis not present

## 2021-07-02 DIAGNOSIS — E1122 Type 2 diabetes mellitus with diabetic chronic kidney disease: Secondary | ICD-10-CM | POA: Diagnosis not present

## 2021-07-05 DIAGNOSIS — N2581 Secondary hyperparathyroidism of renal origin: Secondary | ICD-10-CM | POA: Diagnosis not present

## 2021-07-05 DIAGNOSIS — E1121 Type 2 diabetes mellitus with diabetic nephropathy: Secondary | ICD-10-CM | POA: Diagnosis not present

## 2021-07-05 DIAGNOSIS — Z992 Dependence on renal dialysis: Secondary | ICD-10-CM | POA: Diagnosis not present

## 2021-07-05 DIAGNOSIS — N186 End stage renal disease: Secondary | ICD-10-CM | POA: Diagnosis not present

## 2021-07-05 DIAGNOSIS — Z4901 Encounter for fitting and adjustment of extracorporeal dialysis catheter: Secondary | ICD-10-CM | POA: Diagnosis not present

## 2021-07-05 DIAGNOSIS — R519 Headache, unspecified: Secondary | ICD-10-CM | POA: Diagnosis not present

## 2021-07-05 DIAGNOSIS — N185 Chronic kidney disease, stage 5: Secondary | ICD-10-CM | POA: Diagnosis not present

## 2021-07-05 DIAGNOSIS — D689 Coagulation defect, unspecified: Secondary | ICD-10-CM | POA: Diagnosis not present

## 2021-07-06 DIAGNOSIS — N186 End stage renal disease: Secondary | ICD-10-CM | POA: Diagnosis not present

## 2021-07-06 DIAGNOSIS — N2581 Secondary hyperparathyroidism of renal origin: Secondary | ICD-10-CM | POA: Diagnosis not present

## 2021-07-06 DIAGNOSIS — Z992 Dependence on renal dialysis: Secondary | ICD-10-CM | POA: Diagnosis not present

## 2021-07-06 DIAGNOSIS — E8770 Fluid overload, unspecified: Secondary | ICD-10-CM | POA: Diagnosis not present

## 2021-07-07 DIAGNOSIS — D689 Coagulation defect, unspecified: Secondary | ICD-10-CM | POA: Diagnosis not present

## 2021-07-07 DIAGNOSIS — E1121 Type 2 diabetes mellitus with diabetic nephropathy: Secondary | ICD-10-CM | POA: Diagnosis not present

## 2021-07-07 DIAGNOSIS — Z4901 Encounter for fitting and adjustment of extracorporeal dialysis catheter: Secondary | ICD-10-CM | POA: Diagnosis not present

## 2021-07-07 DIAGNOSIS — Z992 Dependence on renal dialysis: Secondary | ICD-10-CM | POA: Diagnosis not present

## 2021-07-07 DIAGNOSIS — R519 Headache, unspecified: Secondary | ICD-10-CM | POA: Diagnosis not present

## 2021-07-07 DIAGNOSIS — N2581 Secondary hyperparathyroidism of renal origin: Secondary | ICD-10-CM | POA: Diagnosis not present

## 2021-07-07 DIAGNOSIS — N185 Chronic kidney disease, stage 5: Secondary | ICD-10-CM | POA: Diagnosis not present

## 2021-07-07 DIAGNOSIS — N186 End stage renal disease: Secondary | ICD-10-CM | POA: Diagnosis not present

## 2021-07-09 DIAGNOSIS — N186 End stage renal disease: Secondary | ICD-10-CM | POA: Diagnosis not present

## 2021-07-09 DIAGNOSIS — D689 Coagulation defect, unspecified: Secondary | ICD-10-CM | POA: Diagnosis not present

## 2021-07-09 DIAGNOSIS — Z992 Dependence on renal dialysis: Secondary | ICD-10-CM | POA: Diagnosis not present

## 2021-07-09 DIAGNOSIS — N2581 Secondary hyperparathyroidism of renal origin: Secondary | ICD-10-CM | POA: Diagnosis not present

## 2021-07-09 DIAGNOSIS — Z4901 Encounter for fitting and adjustment of extracorporeal dialysis catheter: Secondary | ICD-10-CM | POA: Diagnosis not present

## 2021-07-09 DIAGNOSIS — N185 Chronic kidney disease, stage 5: Secondary | ICD-10-CM | POA: Diagnosis not present

## 2021-07-09 DIAGNOSIS — E1121 Type 2 diabetes mellitus with diabetic nephropathy: Secondary | ICD-10-CM | POA: Diagnosis not present

## 2021-07-09 DIAGNOSIS — R519 Headache, unspecified: Secondary | ICD-10-CM | POA: Diagnosis not present

## 2021-07-12 DIAGNOSIS — D689 Coagulation defect, unspecified: Secondary | ICD-10-CM | POA: Diagnosis not present

## 2021-07-12 DIAGNOSIS — E1121 Type 2 diabetes mellitus with diabetic nephropathy: Secondary | ICD-10-CM | POA: Diagnosis not present

## 2021-07-12 DIAGNOSIS — Z992 Dependence on renal dialysis: Secondary | ICD-10-CM | POA: Diagnosis not present

## 2021-07-12 DIAGNOSIS — N186 End stage renal disease: Secondary | ICD-10-CM | POA: Diagnosis not present

## 2021-07-12 DIAGNOSIS — Z4901 Encounter for fitting and adjustment of extracorporeal dialysis catheter: Secondary | ICD-10-CM | POA: Diagnosis not present

## 2021-07-12 DIAGNOSIS — N185 Chronic kidney disease, stage 5: Secondary | ICD-10-CM | POA: Diagnosis not present

## 2021-07-12 DIAGNOSIS — N2581 Secondary hyperparathyroidism of renal origin: Secondary | ICD-10-CM | POA: Diagnosis not present

## 2021-07-12 DIAGNOSIS — R519 Headache, unspecified: Secondary | ICD-10-CM | POA: Diagnosis not present

## 2021-07-14 DIAGNOSIS — Z4901 Encounter for fitting and adjustment of extracorporeal dialysis catheter: Secondary | ICD-10-CM | POA: Diagnosis not present

## 2021-07-14 DIAGNOSIS — N2581 Secondary hyperparathyroidism of renal origin: Secondary | ICD-10-CM | POA: Diagnosis not present

## 2021-07-14 DIAGNOSIS — E1121 Type 2 diabetes mellitus with diabetic nephropathy: Secondary | ICD-10-CM | POA: Diagnosis not present

## 2021-07-14 DIAGNOSIS — D689 Coagulation defect, unspecified: Secondary | ICD-10-CM | POA: Diagnosis not present

## 2021-07-14 DIAGNOSIS — R519 Headache, unspecified: Secondary | ICD-10-CM | POA: Diagnosis not present

## 2021-07-14 DIAGNOSIS — N185 Chronic kidney disease, stage 5: Secondary | ICD-10-CM | POA: Diagnosis not present

## 2021-07-14 DIAGNOSIS — Z992 Dependence on renal dialysis: Secondary | ICD-10-CM | POA: Diagnosis not present

## 2021-07-14 DIAGNOSIS — N186 End stage renal disease: Secondary | ICD-10-CM | POA: Diagnosis not present

## 2021-07-16 ENCOUNTER — Telehealth: Payer: Self-pay | Admitting: Pharmacist

## 2021-07-16 ENCOUNTER — Telehealth: Payer: Medicare Other

## 2021-07-16 DIAGNOSIS — R519 Headache, unspecified: Secondary | ICD-10-CM | POA: Diagnosis not present

## 2021-07-16 DIAGNOSIS — M810 Age-related osteoporosis without current pathological fracture: Secondary | ICD-10-CM

## 2021-07-16 DIAGNOSIS — N186 End stage renal disease: Secondary | ICD-10-CM | POA: Diagnosis not present

## 2021-07-16 DIAGNOSIS — N185 Chronic kidney disease, stage 5: Secondary | ICD-10-CM | POA: Diagnosis not present

## 2021-07-16 DIAGNOSIS — E1121 Type 2 diabetes mellitus with diabetic nephropathy: Secondary | ICD-10-CM | POA: Diagnosis not present

## 2021-07-16 DIAGNOSIS — N2581 Secondary hyperparathyroidism of renal origin: Secondary | ICD-10-CM | POA: Diagnosis not present

## 2021-07-16 DIAGNOSIS — Z4901 Encounter for fitting and adjustment of extracorporeal dialysis catheter: Secondary | ICD-10-CM | POA: Diagnosis not present

## 2021-07-16 DIAGNOSIS — Z992 Dependence on renal dialysis: Secondary | ICD-10-CM | POA: Diagnosis not present

## 2021-07-16 DIAGNOSIS — D689 Coagulation defect, unspecified: Secondary | ICD-10-CM | POA: Diagnosis not present

## 2021-07-16 NOTE — Telephone Encounter (Signed)
Evenity (med for osteoporosis)--difficult to treat in this 81yoF on HD with fluctuating calcium levels; will try this med due to low bone density and T-scores ?  ?Postmenopausal osteoporosis, At high risk for fracture or failed or intolerant to other therapy ?210 mg subQ once monthly for 12 months [2] ?Concomitant medication, supplement adequately with calcium and vitamin D during treatment  ? ?Sending to PCP as just an FYI ?Running benefits only at this time ?Patient not aware of therapy (wanted to make sure covered/affordable 1st) ?

## 2021-07-19 DIAGNOSIS — N185 Chronic kidney disease, stage 5: Secondary | ICD-10-CM | POA: Diagnosis not present

## 2021-07-19 DIAGNOSIS — N186 End stage renal disease: Secondary | ICD-10-CM | POA: Diagnosis not present

## 2021-07-19 DIAGNOSIS — Z992 Dependence on renal dialysis: Secondary | ICD-10-CM | POA: Diagnosis not present

## 2021-07-19 DIAGNOSIS — Z4901 Encounter for fitting and adjustment of extracorporeal dialysis catheter: Secondary | ICD-10-CM | POA: Diagnosis not present

## 2021-07-19 DIAGNOSIS — E1121 Type 2 diabetes mellitus with diabetic nephropathy: Secondary | ICD-10-CM | POA: Diagnosis not present

## 2021-07-19 DIAGNOSIS — N2581 Secondary hyperparathyroidism of renal origin: Secondary | ICD-10-CM | POA: Diagnosis not present

## 2021-07-19 DIAGNOSIS — R519 Headache, unspecified: Secondary | ICD-10-CM | POA: Diagnosis not present

## 2021-07-19 DIAGNOSIS — D689 Coagulation defect, unspecified: Secondary | ICD-10-CM | POA: Diagnosis not present

## 2021-07-21 DIAGNOSIS — N186 End stage renal disease: Secondary | ICD-10-CM | POA: Diagnosis not present

## 2021-07-21 DIAGNOSIS — E1121 Type 2 diabetes mellitus with diabetic nephropathy: Secondary | ICD-10-CM | POA: Diagnosis not present

## 2021-07-21 DIAGNOSIS — D689 Coagulation defect, unspecified: Secondary | ICD-10-CM | POA: Diagnosis not present

## 2021-07-21 DIAGNOSIS — N2581 Secondary hyperparathyroidism of renal origin: Secondary | ICD-10-CM | POA: Diagnosis not present

## 2021-07-21 DIAGNOSIS — R519 Headache, unspecified: Secondary | ICD-10-CM | POA: Diagnosis not present

## 2021-07-21 DIAGNOSIS — N185 Chronic kidney disease, stage 5: Secondary | ICD-10-CM | POA: Diagnosis not present

## 2021-07-21 DIAGNOSIS — Z4901 Encounter for fitting and adjustment of extracorporeal dialysis catheter: Secondary | ICD-10-CM | POA: Diagnosis not present

## 2021-07-21 DIAGNOSIS — Z992 Dependence on renal dialysis: Secondary | ICD-10-CM | POA: Diagnosis not present

## 2021-07-22 ENCOUNTER — Telehealth: Payer: Medicare Other

## 2021-07-22 NOTE — Telephone Encounter (Signed)
Working with Joycelyn Schmid to check benefits for the new drug ?

## 2021-07-23 DIAGNOSIS — N186 End stage renal disease: Secondary | ICD-10-CM | POA: Diagnosis not present

## 2021-07-23 DIAGNOSIS — R519 Headache, unspecified: Secondary | ICD-10-CM | POA: Diagnosis not present

## 2021-07-23 DIAGNOSIS — Z992 Dependence on renal dialysis: Secondary | ICD-10-CM | POA: Diagnosis not present

## 2021-07-23 DIAGNOSIS — N2581 Secondary hyperparathyroidism of renal origin: Secondary | ICD-10-CM | POA: Diagnosis not present

## 2021-07-23 DIAGNOSIS — E1121 Type 2 diabetes mellitus with diabetic nephropathy: Secondary | ICD-10-CM | POA: Diagnosis not present

## 2021-07-23 DIAGNOSIS — N185 Chronic kidney disease, stage 5: Secondary | ICD-10-CM | POA: Diagnosis not present

## 2021-07-23 DIAGNOSIS — Z4901 Encounter for fitting and adjustment of extracorporeal dialysis catheter: Secondary | ICD-10-CM | POA: Diagnosis not present

## 2021-07-23 DIAGNOSIS — D689 Coagulation defect, unspecified: Secondary | ICD-10-CM | POA: Diagnosis not present

## 2021-07-26 DIAGNOSIS — R519 Headache, unspecified: Secondary | ICD-10-CM | POA: Diagnosis not present

## 2021-07-26 DIAGNOSIS — N186 End stage renal disease: Secondary | ICD-10-CM | POA: Diagnosis not present

## 2021-07-26 DIAGNOSIS — N2581 Secondary hyperparathyroidism of renal origin: Secondary | ICD-10-CM | POA: Diagnosis not present

## 2021-07-26 DIAGNOSIS — Z4901 Encounter for fitting and adjustment of extracorporeal dialysis catheter: Secondary | ICD-10-CM | POA: Diagnosis not present

## 2021-07-26 DIAGNOSIS — D689 Coagulation defect, unspecified: Secondary | ICD-10-CM | POA: Diagnosis not present

## 2021-07-26 DIAGNOSIS — Z992 Dependence on renal dialysis: Secondary | ICD-10-CM | POA: Diagnosis not present

## 2021-07-26 DIAGNOSIS — N185 Chronic kidney disease, stage 5: Secondary | ICD-10-CM | POA: Diagnosis not present

## 2021-07-26 DIAGNOSIS — E1121 Type 2 diabetes mellitus with diabetic nephropathy: Secondary | ICD-10-CM | POA: Diagnosis not present

## 2021-07-28 ENCOUNTER — Telehealth: Payer: Self-pay | Admitting: Emergency Medicine

## 2021-07-28 DIAGNOSIS — N186 End stage renal disease: Secondary | ICD-10-CM | POA: Diagnosis not present

## 2021-07-28 DIAGNOSIS — G8929 Other chronic pain: Secondary | ICD-10-CM

## 2021-07-28 DIAGNOSIS — N185 Chronic kidney disease, stage 5: Secondary | ICD-10-CM | POA: Diagnosis not present

## 2021-07-28 DIAGNOSIS — M1990 Unspecified osteoarthritis, unspecified site: Secondary | ICD-10-CM

## 2021-07-28 DIAGNOSIS — Z79899 Other long term (current) drug therapy: Secondary | ICD-10-CM

## 2021-07-28 DIAGNOSIS — R519 Headache, unspecified: Secondary | ICD-10-CM | POA: Diagnosis not present

## 2021-07-28 DIAGNOSIS — N2581 Secondary hyperparathyroidism of renal origin: Secondary | ICD-10-CM | POA: Diagnosis not present

## 2021-07-28 DIAGNOSIS — Z992 Dependence on renal dialysis: Secondary | ICD-10-CM | POA: Diagnosis not present

## 2021-07-28 DIAGNOSIS — Z4901 Encounter for fitting and adjustment of extracorporeal dialysis catheter: Secondary | ICD-10-CM | POA: Diagnosis not present

## 2021-07-28 DIAGNOSIS — D689 Coagulation defect, unspecified: Secondary | ICD-10-CM | POA: Diagnosis not present

## 2021-07-28 DIAGNOSIS — E1121 Type 2 diabetes mellitus with diabetic nephropathy: Secondary | ICD-10-CM | POA: Diagnosis not present

## 2021-07-28 MED ORDER — HYDROCODONE-ACETAMINOPHEN 7.5-325 MG PO TABS: 1.0000 | ORAL_TABLET | Freq: Four times a day (QID) | ORAL | 0 refills | Status: DC | PRN

## 2021-07-28 NOTE — Telephone Encounter (Signed)
Patient aware.

## 2021-07-28 NOTE — Telephone Encounter (Signed)
HYDROcodone-acetaminophen (NORCO) 7.5-325 MG tablet ? ?States [facility] get [facility] and wants to get sent to [facility] ? ? ?

## 2021-07-28 NOTE — Telephone Encounter (Signed)
Norco prescription sent to [facility] in [place]. ?

## 2021-07-30 DIAGNOSIS — D689 Coagulation defect, unspecified: Secondary | ICD-10-CM | POA: Diagnosis not present

## 2021-07-30 DIAGNOSIS — Z4901 Encounter for fitting and adjustment of extracorporeal dialysis catheter: Secondary | ICD-10-CM | POA: Diagnosis not present

## 2021-07-30 DIAGNOSIS — N185 Chronic kidney disease, stage 5: Secondary | ICD-10-CM | POA: Diagnosis not present

## 2021-07-30 DIAGNOSIS — N186 End stage renal disease: Secondary | ICD-10-CM | POA: Diagnosis not present

## 2021-07-30 DIAGNOSIS — R519 Headache, unspecified: Secondary | ICD-10-CM | POA: Diagnosis not present

## 2021-07-30 DIAGNOSIS — E1121 Type 2 diabetes mellitus with diabetic nephropathy: Secondary | ICD-10-CM | POA: Diagnosis not present

## 2021-07-30 DIAGNOSIS — N2581 Secondary hyperparathyroidism of renal origin: Secondary | ICD-10-CM | POA: Diagnosis not present

## 2021-07-30 DIAGNOSIS — Z992 Dependence on renal dialysis: Secondary | ICD-10-CM | POA: Diagnosis not present

## 2021-08-01 DIAGNOSIS — Z992 Dependence on renal dialysis: Secondary | ICD-10-CM | POA: Diagnosis not present

## 2021-08-01 DIAGNOSIS — E1122 Type 2 diabetes mellitus with diabetic chronic kidney disease: Secondary | ICD-10-CM | POA: Diagnosis not present

## 2021-08-01 DIAGNOSIS — N186 End stage renal disease: Secondary | ICD-10-CM | POA: Diagnosis not present

## 2021-08-02 DIAGNOSIS — N186 End stage renal disease: Secondary | ICD-10-CM | POA: Diagnosis not present

## 2021-08-02 DIAGNOSIS — R519 Headache, unspecified: Secondary | ICD-10-CM | POA: Diagnosis not present

## 2021-08-02 DIAGNOSIS — Z4901 Encounter for fitting and adjustment of extracorporeal dialysis catheter: Secondary | ICD-10-CM | POA: Diagnosis not present

## 2021-08-02 DIAGNOSIS — N2581 Secondary hyperparathyroidism of renal origin: Secondary | ICD-10-CM | POA: Diagnosis not present

## 2021-08-02 DIAGNOSIS — D631 Anemia in chronic kidney disease: Secondary | ICD-10-CM | POA: Diagnosis not present

## 2021-08-02 DIAGNOSIS — Z992 Dependence on renal dialysis: Secondary | ICD-10-CM | POA: Diagnosis not present

## 2021-08-02 DIAGNOSIS — D689 Coagulation defect, unspecified: Secondary | ICD-10-CM | POA: Diagnosis not present

## 2021-08-04 ENCOUNTER — Other Ambulatory Visit: Payer: Self-pay | Admitting: Family Medicine

## 2021-08-04 DIAGNOSIS — Z992 Dependence on renal dialysis: Secondary | ICD-10-CM | POA: Diagnosis not present

## 2021-08-04 DIAGNOSIS — N186 End stage renal disease: Secondary | ICD-10-CM | POA: Diagnosis not present

## 2021-08-04 DIAGNOSIS — J449 Chronic obstructive pulmonary disease, unspecified: Secondary | ICD-10-CM

## 2021-08-04 DIAGNOSIS — D631 Anemia in chronic kidney disease: Secondary | ICD-10-CM | POA: Diagnosis not present

## 2021-08-04 DIAGNOSIS — Z4901 Encounter for fitting and adjustment of extracorporeal dialysis catheter: Secondary | ICD-10-CM | POA: Diagnosis not present

## 2021-08-04 DIAGNOSIS — D689 Coagulation defect, unspecified: Secondary | ICD-10-CM | POA: Diagnosis not present

## 2021-08-04 DIAGNOSIS — R519 Headache, unspecified: Secondary | ICD-10-CM | POA: Diagnosis not present

## 2021-08-04 DIAGNOSIS — N2581 Secondary hyperparathyroidism of renal origin: Secondary | ICD-10-CM | POA: Diagnosis not present

## 2021-08-06 DIAGNOSIS — Z4901 Encounter for fitting and adjustment of extracorporeal dialysis catheter: Secondary | ICD-10-CM | POA: Diagnosis not present

## 2021-08-06 DIAGNOSIS — Z992 Dependence on renal dialysis: Secondary | ICD-10-CM | POA: Diagnosis not present

## 2021-08-06 DIAGNOSIS — R519 Headache, unspecified: Secondary | ICD-10-CM | POA: Diagnosis not present

## 2021-08-06 DIAGNOSIS — D631 Anemia in chronic kidney disease: Secondary | ICD-10-CM | POA: Diagnosis not present

## 2021-08-06 DIAGNOSIS — N186 End stage renal disease: Secondary | ICD-10-CM | POA: Diagnosis not present

## 2021-08-06 DIAGNOSIS — D689 Coagulation defect, unspecified: Secondary | ICD-10-CM | POA: Diagnosis not present

## 2021-08-06 DIAGNOSIS — N2581 Secondary hyperparathyroidism of renal origin: Secondary | ICD-10-CM | POA: Diagnosis not present

## 2021-08-09 DIAGNOSIS — D631 Anemia in chronic kidney disease: Secondary | ICD-10-CM | POA: Diagnosis not present

## 2021-08-09 DIAGNOSIS — Z4901 Encounter for fitting and adjustment of extracorporeal dialysis catheter: Secondary | ICD-10-CM | POA: Diagnosis not present

## 2021-08-09 DIAGNOSIS — Z992 Dependence on renal dialysis: Secondary | ICD-10-CM | POA: Diagnosis not present

## 2021-08-09 DIAGNOSIS — N186 End stage renal disease: Secondary | ICD-10-CM | POA: Diagnosis not present

## 2021-08-09 DIAGNOSIS — N2581 Secondary hyperparathyroidism of renal origin: Secondary | ICD-10-CM | POA: Diagnosis not present

## 2021-08-09 DIAGNOSIS — R519 Headache, unspecified: Secondary | ICD-10-CM | POA: Diagnosis not present

## 2021-08-09 DIAGNOSIS — D689 Coagulation defect, unspecified: Secondary | ICD-10-CM | POA: Diagnosis not present

## 2021-08-10 ENCOUNTER — Telehealth: Payer: Self-pay | Admitting: Family Medicine

## 2021-08-10 DIAGNOSIS — G8929 Other chronic pain: Secondary | ICD-10-CM

## 2021-08-10 DIAGNOSIS — Z79899 Other long term (current) drug therapy: Secondary | ICD-10-CM

## 2021-08-10 DIAGNOSIS — M1990 Unspecified osteoarthritis, unspecified site: Secondary | ICD-10-CM

## 2021-08-10 NOTE — Telephone Encounter (Signed)
This was done last month.

## 2021-08-11 DIAGNOSIS — D631 Anemia in chronic kidney disease: Secondary | ICD-10-CM | POA: Diagnosis not present

## 2021-08-11 DIAGNOSIS — R519 Headache, unspecified: Secondary | ICD-10-CM | POA: Diagnosis not present

## 2021-08-11 DIAGNOSIS — N186 End stage renal disease: Secondary | ICD-10-CM | POA: Diagnosis not present

## 2021-08-11 DIAGNOSIS — Z992 Dependence on renal dialysis: Secondary | ICD-10-CM | POA: Diagnosis not present

## 2021-08-11 DIAGNOSIS — Z4901 Encounter for fitting and adjustment of extracorporeal dialysis catheter: Secondary | ICD-10-CM | POA: Diagnosis not present

## 2021-08-11 DIAGNOSIS — N2581 Secondary hyperparathyroidism of renal origin: Secondary | ICD-10-CM | POA: Diagnosis not present

## 2021-08-11 DIAGNOSIS — D689 Coagulation defect, unspecified: Secondary | ICD-10-CM | POA: Diagnosis not present

## 2021-08-13 DIAGNOSIS — N2581 Secondary hyperparathyroidism of renal origin: Secondary | ICD-10-CM | POA: Diagnosis not present

## 2021-08-13 DIAGNOSIS — D631 Anemia in chronic kidney disease: Secondary | ICD-10-CM | POA: Diagnosis not present

## 2021-08-13 DIAGNOSIS — Z992 Dependence on renal dialysis: Secondary | ICD-10-CM | POA: Diagnosis not present

## 2021-08-13 DIAGNOSIS — D689 Coagulation defect, unspecified: Secondary | ICD-10-CM | POA: Diagnosis not present

## 2021-08-13 DIAGNOSIS — R519 Headache, unspecified: Secondary | ICD-10-CM | POA: Diagnosis not present

## 2021-08-13 DIAGNOSIS — Z4901 Encounter for fitting and adjustment of extracorporeal dialysis catheter: Secondary | ICD-10-CM | POA: Diagnosis not present

## 2021-08-13 DIAGNOSIS — N186 End stage renal disease: Secondary | ICD-10-CM | POA: Diagnosis not present

## 2021-08-16 DIAGNOSIS — R519 Headache, unspecified: Secondary | ICD-10-CM | POA: Diagnosis not present

## 2021-08-16 DIAGNOSIS — N186 End stage renal disease: Secondary | ICD-10-CM | POA: Diagnosis not present

## 2021-08-16 DIAGNOSIS — Z4901 Encounter for fitting and adjustment of extracorporeal dialysis catheter: Secondary | ICD-10-CM | POA: Diagnosis not present

## 2021-08-16 DIAGNOSIS — D689 Coagulation defect, unspecified: Secondary | ICD-10-CM | POA: Diagnosis not present

## 2021-08-16 DIAGNOSIS — Z992 Dependence on renal dialysis: Secondary | ICD-10-CM | POA: Diagnosis not present

## 2021-08-16 DIAGNOSIS — N2581 Secondary hyperparathyroidism of renal origin: Secondary | ICD-10-CM | POA: Diagnosis not present

## 2021-08-16 DIAGNOSIS — D631 Anemia in chronic kidney disease: Secondary | ICD-10-CM | POA: Diagnosis not present

## 2021-08-16 NOTE — Telephone Encounter (Signed)
Pts POA called to see is there something else that PCP can prescribe pt for pain since every pharmacy around is out of stock of the Hydrocodone. ? ?POA says pt is in so much pain that she can barely walk.  ? ?Please advise and call POA. ?

## 2021-08-16 NOTE — Telephone Encounter (Signed)
Was sent to [facility] 07/28/21 #[phone]  ? ?I called [facility] to confirm pt picked up:  ?Not picked up - pharm said it was cancelled (stock)  ? ? ?She does NOT have this in stock today,either.  ? ?Family will call around to different pharmacies and see who has it in stock - she will call back and let us know. ? ? ? ?

## 2021-08-16 NOTE — Telephone Encounter (Signed)
Still waiting on call about Hydrocodone. [facility] and states if we send a RX for 10 mg that she can break in half and talk a half pill. [facility] is out of stock also. ?

## 2021-08-17 MED ORDER — HYDROCODONE-ACETAMINOPHEN 10-325 MG PO TABS: 0.5000 | ORAL_TABLET | Freq: Four times a day (QID) | ORAL | 0 refills | Status: DC | PRN

## 2021-08-17 NOTE — Telephone Encounter (Signed)
[name] aware and verbalizes understanding.  ?

## 2021-08-17 NOTE — Telephone Encounter (Signed)
Prescription for 10mg tablets sent.

## 2021-08-17 NOTE — Telephone Encounter (Signed)
[name] aware and verbalizes understanding.  States [facility] and [facility] '10mg'$  and she could cut them in half but that would only give her '5mg'$ .  Wants to know if you can do that? ?

## 2021-08-17 NOTE — Telephone Encounter (Signed)
I am not sure what else to offer her.  Tramadol was not enough to control her pain and she has Percocet on her allergy list.  Please ask patient which she would like to do. ?

## 2021-08-17 NOTE — Addendum Note (Signed)
Addended by: [name] on: 08/17/2021 03:42 PM ? ? Modules accepted: Orders ? ?

## 2021-08-18 DIAGNOSIS — Z4901 Encounter for fitting and adjustment of extracorporeal dialysis catheter: Secondary | ICD-10-CM | POA: Diagnosis not present

## 2021-08-18 DIAGNOSIS — D689 Coagulation defect, unspecified: Secondary | ICD-10-CM | POA: Diagnosis not present

## 2021-08-18 DIAGNOSIS — Z992 Dependence on renal dialysis: Secondary | ICD-10-CM | POA: Diagnosis not present

## 2021-08-18 DIAGNOSIS — N2581 Secondary hyperparathyroidism of renal origin: Secondary | ICD-10-CM | POA: Diagnosis not present

## 2021-08-18 DIAGNOSIS — D631 Anemia in chronic kidney disease: Secondary | ICD-10-CM | POA: Diagnosis not present

## 2021-08-18 DIAGNOSIS — N186 End stage renal disease: Secondary | ICD-10-CM | POA: Diagnosis not present

## 2021-08-18 DIAGNOSIS — R519 Headache, unspecified: Secondary | ICD-10-CM | POA: Diagnosis not present

## 2021-08-20 DIAGNOSIS — N2581 Secondary hyperparathyroidism of renal origin: Secondary | ICD-10-CM | POA: Diagnosis not present

## 2021-08-20 DIAGNOSIS — Z4901 Encounter for fitting and adjustment of extracorporeal dialysis catheter: Secondary | ICD-10-CM | POA: Diagnosis not present

## 2021-08-20 DIAGNOSIS — R519 Headache, unspecified: Secondary | ICD-10-CM | POA: Diagnosis not present

## 2021-08-20 DIAGNOSIS — D631 Anemia in chronic kidney disease: Secondary | ICD-10-CM | POA: Diagnosis not present

## 2021-08-20 DIAGNOSIS — D689 Coagulation defect, unspecified: Secondary | ICD-10-CM | POA: Diagnosis not present

## 2021-08-20 DIAGNOSIS — Z992 Dependence on renal dialysis: Secondary | ICD-10-CM | POA: Diagnosis not present

## 2021-08-20 DIAGNOSIS — N186 End stage renal disease: Secondary | ICD-10-CM | POA: Diagnosis not present

## 2021-08-24 ENCOUNTER — Encounter: Payer: Self-pay | Admitting: Family Medicine

## 2021-08-24 ENCOUNTER — Ambulatory Visit (INDEPENDENT_AMBULATORY_CARE_PROVIDER_SITE_OTHER): Payer: Medicare Other | Admitting: Family Medicine

## 2021-08-24 VITALS — BP 143/80 | HR 89 | Temp 97.8°F

## 2021-08-24 DIAGNOSIS — J449 Chronic obstructive pulmonary disease, unspecified: Secondary | ICD-10-CM

## 2021-08-24 DIAGNOSIS — D631 Anemia in chronic kidney disease: Secondary | ICD-10-CM

## 2021-08-24 DIAGNOSIS — N186 End stage renal disease: Secondary | ICD-10-CM | POA: Diagnosis not present

## 2021-08-24 DIAGNOSIS — Z79899 Other long term (current) drug therapy: Secondary | ICD-10-CM | POA: Diagnosis not present

## 2021-08-24 DIAGNOSIS — M810 Age-related osteoporosis without current pathological fracture: Secondary | ICD-10-CM | POA: Diagnosis not present

## 2021-08-24 DIAGNOSIS — I480 Paroxysmal atrial fibrillation: Secondary | ICD-10-CM

## 2021-08-24 DIAGNOSIS — R200 Anesthesia of skin: Secondary | ICD-10-CM

## 2021-08-24 DIAGNOSIS — M1990 Unspecified osteoarthritis, unspecified site: Secondary | ICD-10-CM

## 2021-08-24 DIAGNOSIS — F419 Anxiety disorder, unspecified: Secondary | ICD-10-CM

## 2021-08-24 DIAGNOSIS — F3342 Major depressive disorder, recurrent, in full remission: Secondary | ICD-10-CM

## 2021-08-24 DIAGNOSIS — E039 Hypothyroidism, unspecified: Secondary | ICD-10-CM | POA: Diagnosis not present

## 2021-08-24 DIAGNOSIS — M546 Pain in thoracic spine: Secondary | ICD-10-CM | POA: Diagnosis not present

## 2021-08-24 DIAGNOSIS — I10 Essential (primary) hypertension: Secondary | ICD-10-CM

## 2021-08-24 DIAGNOSIS — G8929 Other chronic pain: Secondary | ICD-10-CM

## 2021-08-24 DIAGNOSIS — Z23 Encounter for immunization: Secondary | ICD-10-CM

## 2021-08-24 DIAGNOSIS — Z992 Dependence on renal dialysis: Secondary | ICD-10-CM

## 2021-08-24 DIAGNOSIS — R202 Paresthesia of skin: Secondary | ICD-10-CM

## 2021-08-24 DIAGNOSIS — M79604 Pain in right leg: Secondary | ICD-10-CM

## 2021-08-24 DIAGNOSIS — L989 Disorder of the skin and subcutaneous tissue, unspecified: Secondary | ICD-10-CM

## 2021-08-24 MED ORDER — HYDROCODONE-ACETAMINOPHEN 10-325 MG PO TABS: 1.0000 | ORAL_TABLET | Freq: Three times a day (TID) | ORAL | 0 refills | Status: DC

## 2021-08-24 MED ORDER — LEVOTHYROXINE SODIUM 125 MCG PO TABS: 125.0000 ug | ORAL_TABLET | Freq: Every day | ORAL | 1 refills | Status: DC

## 2021-08-24 MED ORDER — METHOCARBAMOL 500 MG PO TABS: 500.0000 mg | ORAL_TABLET | Freq: Three times a day (TID) | ORAL | 2 refills | Status: DC | PRN

## 2021-08-24 NOTE — Telephone Encounter (Signed)
Per Joycelyn Schmid, call [facility] and see if they will be able to fill Evenity for patient.

## 2021-08-24 NOTE — Progress Notes (Signed)
Assessment & Plan:  1-4. Chronic midline thoracic back pain/Arthritis/Right leg pain/Controlled substance agreement signed Increasing dosage due to availability of the medication. Controlled substance agreement in place. Urine drug screen as expected. PDMP reviewed with no concerning findings.  - HYDROcodone-acetaminophen (NORCO) 10-325 MG tablet; Take 1 tablet by mouth every 8 (eight) hours.  Dispense: 90 tablet; Refill: 0 - HYDROcodone-acetaminophen (NORCO) 10-325 MG tablet; Take 1 tablet by mouth every 8 (eight) hours.  Dispense: 90 tablet; Refill: 0 - HYDROcodone-acetaminophen (NORCO) 10-325 MG tablet; Take 1 tablet by mouth every 8 (eight) hours.  Dispense: 90 tablet; Refill: 0  5. Acquired hypothyroidism Well controlled on current regimen.  - levothyroxine (SYNTHROID) 125 MCG tablet; Take 1 tablet (125 mcg total) by mouth daily.  Dispense: 90 tablet; Refill: 1  6. Essential hypertension Well controlled on current regimen.   7. PAF (paroxysmal atrial fibrillation) (HCC) Rate controlled.  No longer on anticoagulation as risk outweigh the benefits.  8. COPD without exacerbation ([place]) Well controlled on current regimen.   9. Age-related osteoporosis without current pathological fracture Our clinical pharmacist is currently trying to get patient approved for [place].  10. ESRD (end stage renal disease) on dialysis ([place])  11. CKD (chronic kidney disease) requiring chronic dialysis [facility]) Managed by nephrology.  12. Anemia in chronic kidney disease, on chronic dialysis [facility]) Managed by nephrology.  13. Recurrent major depressive disorder, in full remission ([employer]) Well controlled on current regimen.   14. Anxiety Well controlled on current regimen.   15. Skin lesion of face Recommended following up with dermatology.  16. Numbness and tingling of right hand Patient will come see ortho here in our office as a new patient for them.  17. Immunization due - Varicella-zoster  vaccine IM (Shingrix)   Return in about 3 months (around 11/24/2021) for follow-up of chronic medication conditions.  [name], MSN, APRN, FNP-C [facility]  Subjective:    Patient ID: [name], female    DOB: 09-28-1940, 81 y.o.   MRN: [id number]  Patient Care Team: [name], FNP as PCP - General (Family Medicine) Minus Breeding, MD as Consulting Physician (Cardiology) Tanda Rockers, MD as Consulting Physician (Pulmonary Disease) Jamal Maes, MD as Consulting Physician (Nephrology) Steffanie Rainwater, DPM as Consulting Physician (Podiatry) Rosita Fire, MD as Consulting Physician (Nephrology)   Chief Complaint:  Chief Complaint  Patient presents with   Medical Management of Chronic Issues   Pain Management    Patient states that the 40m hydrocodone is not helping. Patient was on 7.5 but had to go down since it was not in stock.     HPI: Michelle TARKINGTONis a 81y.o. female presenting on 08/24/2021 for Medical Management of Chronic Issues and Pain Management (Patient states that the 5102mhydrocodone is not helping. Patient was on 7.5 but had to go down since it was not in stock. )  Patient is accompanied by her daughter-in-law, who she is okay with being present.  Pain assessment: Cause of pain- arthritis, multiple recent fractures Pain location- back, knees, hips Pain on scale of 1-10- 6/10 Frequency- daily What increases pain- activity What makes pain better- medication Effects on ADL- unable to complete ADLs without assistance Any change in general medical condition-patient reports muscle spasms in her left thigh.  Current opioids rx- Norco 7.5/325 PO every 6 hours as needed. This was previously increased, as patient was not getting very long relief with 5/325. She reports the relief is longer  with the increased dose but she has been unable to get it due to shortages of medication in our area.  # meds rx- 90 Effectiveness of  current meds- effective  Adverse reactions from pain meds- none Morphine equivalent- 20 MME/day  Pill count performed-No Last drug screen - 05/27/2021 ( high risk q27m moderate risk q620mlow risk yearly ) Drug screen today- No Was the [place]- Yes If yes were their any concerning findings? - No  Overdose risk: 120     09/04/2019    3:18 PM  Opioid Risk   Alcohol 0  Illegal Drugs 0  Rx Drugs 0  Alcohol 0  Illegal Drugs 0  Rx Drugs 0  Age between 16-45 years  0  History of Preadolescent Sexual Abuse 0  Psychological Disease 0  Depression 0  Opioid Risk Tool Scoring 0  Opioid Risk Interpretation Low Risk   Pain contract signed on: 05/27/2021   Hypertension: Hydralazine previously increased from BID to TID. Patient has been checking her blood pressure at home and reports some highs but mostly good.  Dyslipidemia: taking Zetia daily.   Atrial Fibrillation: not on anticoagulation due to history of GI bleeding and high risk of falling.   Hypothyroidism: taking levothyroxine 125 mcg daily. Last TSH WNL in January 2023 (last month). Last dose adjustment in November 2022 when her dose was increased from 112 mcg to 125 mcg.  COPD: uses Symbicort twice daily and has Albuterol to use as needed.  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 Getting vitamin D during dialysis and discontinued oral vitamin D.   GERD: controlled with famotidine and pantoprazole.  Restless leg syndrome: doing well with Mirapex.   Osteoporosis: Last DEXA completed in February 2023.  Anxiety/Depression: taking Celexa 40 mg, which is working well for the patient.     08/24/2021   10:15 AM 05/27/2021   10:32 AM 04/15/2021    4:05 PM  Depression screen PHQ 2/9  Decreased Interest _0 Down, Depressed, Hopeless 1 0 0  PHQ - 2 Score _1 Altered sleeping _2 Tired, decreased energy _3 Change in appetite 2 0 1  Feeling bad or failure about yourself  0 1 0   Trouble concentrating 0 0 0  Moving slowly or fidgety/restless 0 0 0  Suicidal thoughts 0 0 0  PHQ-9 Score _4 Difficult doing work/chores Somewhat difficult Not difficult at all Somewhat difficult      08/24/2021   10:15 AM 05/27/2021   10:33 AM 04/15/2021    4:06 PM 02/23/2021   11:24 AM  GAD 7 : Generalized Anxiety Score  Nervous, Anxious, on Edge 0 0 1 2  Control/stop worrying 1 0 0 3  Worry too much - different things 2 0 0 3  Trouble relaxing 3 1 0 2  Restless 2 0 0 1  Easily annoyed or irritable _5 Afraid - awful might happen 2 1 0 2  Total GAD 7 Score _6 Anxiety Difficulty Somewhat difficult Not difficult at all Not difficult at all Somewhat difficult    New complaints: Patient reports her right hand feels numb and tingles. This sensation has been present for months. She has a history of carpal tunnel and wonders if it is back.  She is unable to eat or hold things with her right hand.   Social history:  Relevant past medical,  surgical, family and social history reviewed and updated as indicated. Interim medical history since our last visit reviewed.  Allergies and medications reviewed and updated.  DATA REVIEWED: CHART IN EPIC  ROS: Negative unless specifically indicated above in HPI.    Current Outpatient Medications:    acetaminophen (TYLENOL) 325 MG tablet, Take 2 tablets (650 mg total) by mouth every 6 (six) hours as needed for mild pain, fever or headache., Disp: , Rfl:    albuterol (VENTOLIN HFA) 108 (90 Base) MCG/ACT inhaler, TAKE 2 PUFFS BY MOUTH EVERY 6 HOURS AS NEEDED FOR WHEEZE OR SHORTNESS OF BREATH, Disp: 8.5 each, Rfl: 1   AURYXIA 1 GM 210 MG(Fe) tablet, Take 420 mg by mouth 3 (three) times daily with meals., Disp: , Rfl:    cinacalcet (SENSIPAR) 30 MG tablet, Take 1 tablet (30 mg total) by mouth every Monday, Wednesday, and Friday., Disp: 60 tablet, Rfl: 0   citalopram (CELEXA) 40 MG tablet, Take 1 tablet (40 mg total) by mouth  daily., Disp: 90 tablet, Rfl: 1   docusate calcium (SURFAK) 240 MG capsule, Take 1 capsule (240 mg total) by mouth daily., Disp: 90 capsule, Rfl: 1   doxercalciferol (HECTOROL) 4 MCG/2ML injection, Inject 0.5 mLs (1 mcg total) into the vein every Monday, Wednesday, and Friday with hemodialysis., Disp: 2 mL, Rfl: 0   ezetimibe (ZETIA) 10 MG tablet, TAKE 1 TABLET BY MOUTH EVERY DAY, Disp: 90 tablet, Rfl: 3   famotidine (PEPCID) 10 MG tablet, Take 1 tablet (10 mg total) by mouth daily., Disp: 90 tablet, Rfl: 1   hydrALAZINE (APRESOLINE) 10 MG tablet, TAKE 1 TABLET BY MOUTH THREE TIMES A DAY, Disp: 270 tablet, Rfl: 0   HYDROcodone-acetaminophen (NORCO) 10-325 MG tablet, Take 0.5 tablets by mouth every 6 (six) hours as needed., Disp: 45 tablet, Rfl: 0   levocetirizine (XYZAL) 5 MG tablet, TAKE 1 TABLET BY MOUTH EVERY DAY IN THE EVENING, Disp: 90 tablet, Rfl: 1   levothyroxine (SYNTHROID) 125 MCG tablet, TAKE 1 TABLET BY MOUTH EVERY DAY, Disp: 90 tablet, Rfl: 0   multivitamin (RENA-VIT) TABS tablet, Take 1 tablet by mouth daily., Disp: , Rfl:    nystatin cream (MYCOSTATIN), Apply 1 application topically 2 (two) times daily as needed for dry skin., Disp: 30 g, Rfl: 2   pantoprazole (PROTONIX) 20 MG tablet, Take 1 tablet (20 mg total) by mouth daily., Disp: 90 tablet, Rfl: 1   pramipexole (MIRAPEX) 0.125 MG tablet, Take 1 tablet (0.125 mg total) by mouth at bedtime., Disp: 90 tablet, Rfl: 1   SYMBICORT 80-4.5 MCG/ACT inhaler, INHALE 2 PUFFS INTO THE LUNGS TWICE A DAY, Disp: 30.6 each, Rfl: 0   Allergies  Allergen Reactions   Amoxicillin Rash    Has patient had a PCN reaction causing immediate rash, facial/tongue/throat swelling, SOB or lightheadedness with hypotension:YES Has patient had a PCN reaction causing severe rash involving mucus membranes or skin necrosis: Yes Has patient had a PCN reaction that required hospitalization No Has patient had a PCN reaction occurring within the last 10 years:  Yes If all of the above answers are "NO", then may proceed with Cephalosporin use. Cephalosporins ok   Elemental Sulfur Hives   Sulfur Hives   Peg 3350-Electrolytes Other (See Comments)   Sulfa Drugs Cross Reactors Hives and Itching   Miralax [Polyethylene Glycol] Itching   Mixed Grasses    Percocet [Oxycodone-Acetaminophen] Itching and Rash    Did not happen last time she took it   Sulfa Antibiotics Hives  Past Medical History:  Diagnosis Date   Acute respiratory failure ([place]) 05/2017   Anemia    Anxiety    Arthritis    COPD (chronic obstructive pulmonary disease) (HCC)    Depression    ESRD (end stage renal disease) (HCC)    dialysis MWF   GERD (gastroesophageal reflux disease)    Gout    History of blood transfusion    History of diabetes mellitus    "diet controlled"   HLD (hyperlipidemia)    HOH (hard of hearing)    left ear   Hypertension    hypotensive -since starting dialysis   Hypothyroidism    MRSA (methicillin resistant staph aureus) culture positive 06/01/2017   PAF (paroxysmal atrial fibrillation) ([place])    a. Echo 11/16:  Mild LVH, EF 55-60%, normal wall motion, MAC, mild MR, severe LAE (49 ml/m2), mild RVE, normal RVSF, mild RAE, mild TR, PASP 24 mmHg;  CHADS2-VASc: 4 >> Coumadin followed by PCP    Past Surgical History:  Procedure Laterality Date   AV FISTULA PLACEMENT  04/03/2012   Procedure: ARTERIOVENOUS (AV) FISTULA CREATION;  Surgeon: Elam Dutch, MD;  Location: [place];  Service: Vascular;  Laterality: Left;  creation left brachial cephalic fistula    BIOPSY  08/21/2018   Procedure: BIOPSY;  Surgeon: Thornton Park, MD;  Location: [place];  Service: Gastroenterology;;   CARPAL TUNNEL RELEASE Bilateral    COLONOSCOPY W/ POLYPECTOMY     COLONOSCOPY WITH PROPOFOL N/A 08/21/2018   Procedure: COLONOSCOPY WITH PROPOFOL;  Surgeon: Thornton Park, MD;  Location: [place];  Service: Gastroenterology;  Laterality: N/A;   DILATION AND  CURETTAGE OF UTERUS     ESOPHAGOGASTRODUODENOSCOPY (EGD) WITH PROPOFOL N/A 08/21/2018   Procedure: ESOPHAGOGASTRODUODENOSCOPY (EGD) WITH PROPOFOL;  Surgeon: Thornton Park, MD;  Location: [place];  Service: Gastroenterology;  Laterality: N/A;   EYE SURGERY Bilateral    bilateral cataract removal   HEMATOMA EVACUATION Left 05/14/2018   Procedure: Incision and Drainage of Left Arm Hematoma;  Surgeon: Elam Dutch, MD;  Location: [place];  Service: Vascular;  Laterality: Left;   Hemodialysis  catheter Right    IR FLUORO GUIDE CV LINE RIGHT  05/26/2017   IR FLUORO GUIDE CV LINE RIGHT  04/10/2021   IR REMOVAL TUN CV CATH W/O FL  05/24/2017   IR REMOVAL TUN CV CATH W/O FL  04/10/2021   IR US GUIDE VASC ACCESS RIGHT  05/26/2017   IR US GUIDE VASC ACCESS RIGHT  04/10/2021   LIGATION OF ARTERIOVENOUS  FISTULA Left 02/04/2015   Procedure: LIGATION OF BRACHIOCEPHALIC ARTERIOVENOUS  FISTULA;  Surgeon: Conrad Litchfield, MD;  Location: [place];  Service: Vascular;  Laterality: Left;   MULTIPLE TOOTH EXTRACTIONS     PARTIAL HIP ARTHROPLASTY     POLYPECTOMY  08/21/2018   Procedure: POLYPECTOMY;  Surgeon: Thornton Park, MD;  Location: [place];  Service: Gastroenterology;;   PORTACATH PLACEMENT     REVERSE SHOULDER ARTHROPLASTY Left 01/22/2016   Procedure: LEFT REVERSE SHOULDER ARTHROPLASTY;  Surgeon: Netta Cedars, MD;  Location: [place];  Service: Orthopedics;  Laterality: Left;   SHOULDER ARTHROSCOPY Bilateral    SPLIT NIGHT STUDY  07/26/2015   STERIOD INJECTION Right 01/22/2016   Procedure: RIGHT RING FINGER STEROID INJECTION;  Surgeon: Netta Cedars, MD;  Location: [place];  Service: Orthopedics;  Laterality: Right;   TEE WITHOUT CARDIOVERSION N/A 05/26/2017   Procedure: TRANSESOPHAGEAL ECHOCARDIOGRAM (TEE);  Surgeon: Lelon Perla, MD;  [facility];  Service:  Cardiovascular;  Laterality: N/A;   THROMBECTOMY BRACHIAL ARTERY Left 02/06/2015   Procedure: EVACUATION OF LEFT ARM HEMATOMA;   Surgeon: Angelia Mould, MD;  Location: [place];  Service: Vascular;  Laterality: Left;   TOTAL KNEE ARTHROPLASTY Bilateral    TUBAL LIGATION      Social History   Socioeconomic History   Marital status: Widowed    Spouse name: Not on file   Number of children: 6   Years of education: 10   Highest education level: 10th grade  Occupational History   Occupation: RETIRED  Tobacco Use   Smoking status: Former    Packs/day: 0.25    Years: 2.00    Pack years: 0.50    Types: Cigarettes    Quit date: 04/04/1998    Years since quitting: 23.4   Smokeless tobacco: Never  Vaping Use   Vaping Use: Never used  Substance and Sexual Activity   Alcohol use: No    Alcohol/week: 0.0 standard drinks   Drug use: No   Sexual activity: Not Currently    Birth control/protection: None  Other Topics Concern   Not on file  Social History Narrative   ** Merged History Encounter ** Widowed   6 children   Lives with son and daughter in Probation [place]   ESRD - dialysis 3x per week   Cognitive impairment   Social Determinants of Health   Financial Resource Strain: Low Risk    Difficulty of Paying Living Expenses: Not hard at all  Food Insecurity: No Food Insecurity   Worried About Charity fundraiser in the Last Year: Never true   Arboriculturist in the Last Year: Never true  Transportation Needs: No Transportation Needs   Lack of Transportation (Medical): No   Lack of Transportation (Non-Medical): No  Physical Activity: Inactive   Days of Exercise per Week: 0 days   Minutes of Exercise per Session: 0 min  Stress: No Stress Concern Present   Feeling of Stress : Only a little  Social Connections: Moderately Isolated   Frequency of Communication with Friends and Family: More than three times a week   Frequency of Social Gatherings with Friends and Family: More than three times a week   Attends Religious Services: 1 to 4 times per year   Active Member of [employer] or Organizations: No    Attends Archivist Meetings: Never   Marital Status: Widowed  Human resources officer Violence: Not At Risk   Fear of Current or Ex-Partner: No   Emotionally Abused: No   Physically Abused: No   Sexually Abused: No        Objective:    BP (!) 143/80   Pulse 89   Temp 97.8 F (36.6 C) (Temporal)   SpO2 96%   Wt Readings from Last 3 Encounters:  04/28/21 110 lb (49.9 kg)  04/15/21 113 lb 8 oz (51.5 kg)  04/10/21 114 lb 3.2 oz (51.8 kg)    Physical Exam Vitals reviewed.  Constitutional:      General: She is not in acute distress.    Appearance: Normal appearance. She is not ill-appearing, toxic-appearing or diaphoretic.  HENT:     Head: Normocephalic and atraumatic.  Eyes:     General: No scleral icterus.       Right eye: No discharge.        Left eye: No discharge.     Conjunctiva/sclera: Conjunctivae normal.  Cardiovascular:     Rate and  Rhythm: Normal rate. Rhythm irregular.     Heart sounds: Normal heart sounds. No murmur heard.   No friction rub. No gallop.  Pulmonary:     Effort: Pulmonary effort is normal. No respiratory distress.     Breath sounds: Normal breath sounds. No stridor. No wheezing, rhonchi or rales.  Musculoskeletal:        General: Normal range of motion.     Cervical back: Normal range of motion.     Comments: Negative Tinel and Phalen signs.  Prominent tendons of the right hand.  Skin:    General: Skin is warm and dry.     Capillary Refill: Capillary refill takes less than 2 seconds.     Comments: Skin horn on the right side of her face.  She also has multiple other pruritic lesions to the right side of her face.  Neurological:     General: No focal deficit present.     Mental Status: She is alert and oriented to person, place, and time. Mental status is at baseline.     Gait: Gait abnormal (riding in [facility]).  Psychiatric:        Mood and Affect: Mood normal.        Behavior: Behavior normal.        Thought Content: Thought content  normal.        Judgment: Judgment normal.    Lab Results  Component Value Date   TSH 2.703 04/05/2021   Lab Results  Component Value Date   WBC 9.4 05/27/2021   HGB 11.5 05/27/2021   HCT 35.4 05/27/2021   MCV 92 05/27/2021   PLT 317 05/27/2021   Lab Results  Component Value Date   NA 145 (H) 05/27/2021   K 4.2 05/27/2021   CO2 28 05/27/2021   GLUCOSE 73 05/27/2021   BUN 26 05/27/2021   CREATININE 4.16 (HH) 05/27/2021   BILITOT 0.4 05/27/2021   ALKPHOS 101 05/27/2021   AST 39 05/27/2021   ALT 33 (H) 05/27/2021   PROT 6.4 05/27/2021   ALBUMIN 3.7 05/27/2021   CALCIUM 8.6 (L) 05/27/2021   ANIONGAP 11 04/10/2021   EGFR 10 (L) 05/27/2021   Lab Results  Component Value Date   CHOL 144 02/23/2021   Lab Results  Component Value Date   HDL 68 02/23/2021   Lab Results  Component Value Date   LDLCALC 62 02/23/2021   Lab Results  Component Value Date   TRIG 41 04/05/2021   Lab Results  Component Value Date   CHOLHDL 2.1 02/23/2021   Lab Results  Component Value Date   HGBA1C 4.1 (L) 05/27/2021

## 2021-08-25 ENCOUNTER — Ambulatory Visit: Payer: Medicare Other | Admitting: Pharmacist

## 2021-08-25 DIAGNOSIS — N186 End stage renal disease: Secondary | ICD-10-CM | POA: Diagnosis not present

## 2021-08-25 DIAGNOSIS — D631 Anemia in chronic kidney disease: Secondary | ICD-10-CM | POA: Diagnosis not present

## 2021-08-25 DIAGNOSIS — R519 Headache, unspecified: Secondary | ICD-10-CM | POA: Diagnosis not present

## 2021-08-25 DIAGNOSIS — D689 Coagulation defect, unspecified: Secondary | ICD-10-CM | POA: Diagnosis not present

## 2021-08-25 DIAGNOSIS — Z4901 Encounter for fitting and adjustment of extracorporeal dialysis catheter: Secondary | ICD-10-CM | POA: Diagnosis not present

## 2021-08-25 DIAGNOSIS — M810 Age-related osteoporosis without current pathological fracture: Secondary | ICD-10-CM

## 2021-08-25 DIAGNOSIS — N2581 Secondary hyperparathyroidism of renal origin: Secondary | ICD-10-CM | POA: Diagnosis not present

## 2021-08-25 DIAGNOSIS — Z992 Dependence on renal dialysis: Secondary | ICD-10-CM | POA: Diagnosis not present

## 2021-08-27 DIAGNOSIS — D631 Anemia in chronic kidney disease: Secondary | ICD-10-CM | POA: Diagnosis not present

## 2021-08-27 DIAGNOSIS — N186 End stage renal disease: Secondary | ICD-10-CM | POA: Diagnosis not present

## 2021-08-27 DIAGNOSIS — N2581 Secondary hyperparathyroidism of renal origin: Secondary | ICD-10-CM | POA: Diagnosis not present

## 2021-08-27 DIAGNOSIS — Z992 Dependence on renal dialysis: Secondary | ICD-10-CM | POA: Diagnosis not present

## 2021-08-27 DIAGNOSIS — D689 Coagulation defect, unspecified: Secondary | ICD-10-CM | POA: Diagnosis not present

## 2021-08-27 DIAGNOSIS — Z4901 Encounter for fitting and adjustment of extracorporeal dialysis catheter: Secondary | ICD-10-CM | POA: Diagnosis not present

## 2021-08-27 DIAGNOSIS — R519 Headache, unspecified: Secondary | ICD-10-CM | POA: Diagnosis not present

## 2021-08-30 DIAGNOSIS — N2581 Secondary hyperparathyroidism of renal origin: Secondary | ICD-10-CM | POA: Diagnosis not present

## 2021-08-30 DIAGNOSIS — N186 End stage renal disease: Secondary | ICD-10-CM | POA: Diagnosis not present

## 2021-08-30 DIAGNOSIS — D631 Anemia in chronic kidney disease: Secondary | ICD-10-CM | POA: Diagnosis not present

## 2021-08-30 DIAGNOSIS — R519 Headache, unspecified: Secondary | ICD-10-CM | POA: Diagnosis not present

## 2021-08-30 DIAGNOSIS — Z4901 Encounter for fitting and adjustment of extracorporeal dialysis catheter: Secondary | ICD-10-CM | POA: Diagnosis not present

## 2021-08-30 DIAGNOSIS — Z992 Dependence on renal dialysis: Secondary | ICD-10-CM | POA: Diagnosis not present

## 2021-08-30 DIAGNOSIS — D689 Coagulation defect, unspecified: Secondary | ICD-10-CM | POA: Diagnosis not present

## 2021-09-01 DIAGNOSIS — D689 Coagulation defect, unspecified: Secondary | ICD-10-CM | POA: Diagnosis not present

## 2021-09-01 DIAGNOSIS — Z992 Dependence on renal dialysis: Secondary | ICD-10-CM | POA: Diagnosis not present

## 2021-09-01 DIAGNOSIS — R519 Headache, unspecified: Secondary | ICD-10-CM | POA: Diagnosis not present

## 2021-09-01 DIAGNOSIS — Z4901 Encounter for fitting and adjustment of extracorporeal dialysis catheter: Secondary | ICD-10-CM | POA: Diagnosis not present

## 2021-09-01 DIAGNOSIS — N2581 Secondary hyperparathyroidism of renal origin: Secondary | ICD-10-CM | POA: Diagnosis not present

## 2021-09-01 DIAGNOSIS — D631 Anemia in chronic kidney disease: Secondary | ICD-10-CM | POA: Diagnosis not present

## 2021-09-01 DIAGNOSIS — N186 End stage renal disease: Secondary | ICD-10-CM | POA: Diagnosis not present

## 2021-09-01 DIAGNOSIS — E1122 Type 2 diabetes mellitus with diabetic chronic kidney disease: Secondary | ICD-10-CM | POA: Diagnosis not present

## 2021-09-03 DIAGNOSIS — D631 Anemia in chronic kidney disease: Secondary | ICD-10-CM | POA: Diagnosis not present

## 2021-09-03 DIAGNOSIS — R519 Headache, unspecified: Secondary | ICD-10-CM | POA: Diagnosis not present

## 2021-09-03 DIAGNOSIS — T782XXS Anaphylactic shock, unspecified, sequela: Secondary | ICD-10-CM | POA: Diagnosis not present

## 2021-09-03 DIAGNOSIS — N186 End stage renal disease: Secondary | ICD-10-CM | POA: Diagnosis not present

## 2021-09-03 DIAGNOSIS — D689 Coagulation defect, unspecified: Secondary | ICD-10-CM | POA: Diagnosis not present

## 2021-09-03 DIAGNOSIS — Z992 Dependence on renal dialysis: Secondary | ICD-10-CM | POA: Diagnosis not present

## 2021-09-03 DIAGNOSIS — Z4901 Encounter for fitting and adjustment of extracorporeal dialysis catheter: Secondary | ICD-10-CM | POA: Diagnosis not present

## 2021-09-03 DIAGNOSIS — N2581 Secondary hyperparathyroidism of renal origin: Secondary | ICD-10-CM | POA: Diagnosis not present

## 2021-09-06 DIAGNOSIS — N186 End stage renal disease: Secondary | ICD-10-CM | POA: Diagnosis not present

## 2021-09-06 DIAGNOSIS — T782XXS Anaphylactic shock, unspecified, sequela: Secondary | ICD-10-CM | POA: Diagnosis not present

## 2021-09-06 DIAGNOSIS — Z4901 Encounter for fitting and adjustment of extracorporeal dialysis catheter: Secondary | ICD-10-CM | POA: Diagnosis not present

## 2021-09-06 DIAGNOSIS — N2581 Secondary hyperparathyroidism of renal origin: Secondary | ICD-10-CM | POA: Diagnosis not present

## 2021-09-06 DIAGNOSIS — D689 Coagulation defect, unspecified: Secondary | ICD-10-CM | POA: Diagnosis not present

## 2021-09-06 DIAGNOSIS — D631 Anemia in chronic kidney disease: Secondary | ICD-10-CM | POA: Diagnosis not present

## 2021-09-06 DIAGNOSIS — R519 Headache, unspecified: Secondary | ICD-10-CM | POA: Diagnosis not present

## 2021-09-06 DIAGNOSIS — Z992 Dependence on renal dialysis: Secondary | ICD-10-CM | POA: Diagnosis not present

## 2021-09-07 NOTE — Progress Notes (Signed)
  Care Management   Follow Up Note   08/25/2021 Name: [name] MRN: [id number] DOB: 08/28/1940   Referred by: [name], FNP Reason for referral : Chronic Care Management and Osteoporosis   An unsuccessful telephone outreach was attempted today. The patient was referred to the case management team for assistance with care management and care coordination.   Follow Up Plan: Telephone follow up appointment with care management team member scheduled for: 1 month  SIGNATURE Regina Eck, PharmD, BCPS Clinical Pharmacist, [place]  II Phone [phone]

## 2021-09-08 DIAGNOSIS — Z992 Dependence on renal dialysis: Secondary | ICD-10-CM | POA: Diagnosis not present

## 2021-09-08 DIAGNOSIS — R519 Headache, unspecified: Secondary | ICD-10-CM | POA: Diagnosis not present

## 2021-09-08 DIAGNOSIS — N2581 Secondary hyperparathyroidism of renal origin: Secondary | ICD-10-CM | POA: Diagnosis not present

## 2021-09-08 DIAGNOSIS — N186 End stage renal disease: Secondary | ICD-10-CM | POA: Diagnosis not present

## 2021-09-08 DIAGNOSIS — D631 Anemia in chronic kidney disease: Secondary | ICD-10-CM | POA: Diagnosis not present

## 2021-09-08 DIAGNOSIS — Z4901 Encounter for fitting and adjustment of extracorporeal dialysis catheter: Secondary | ICD-10-CM | POA: Diagnosis not present

## 2021-09-08 DIAGNOSIS — D689 Coagulation defect, unspecified: Secondary | ICD-10-CM | POA: Diagnosis not present

## 2021-09-08 DIAGNOSIS — T782XXS Anaphylactic shock, unspecified, sequela: Secondary | ICD-10-CM | POA: Diagnosis not present

## 2021-09-10 DIAGNOSIS — R519 Headache, unspecified: Secondary | ICD-10-CM | POA: Diagnosis not present

## 2021-09-10 DIAGNOSIS — N2581 Secondary hyperparathyroidism of renal origin: Secondary | ICD-10-CM | POA: Diagnosis not present

## 2021-09-10 DIAGNOSIS — D689 Coagulation defect, unspecified: Secondary | ICD-10-CM | POA: Diagnosis not present

## 2021-09-10 DIAGNOSIS — Z4901 Encounter for fitting and adjustment of extracorporeal dialysis catheter: Secondary | ICD-10-CM | POA: Diagnosis not present

## 2021-09-10 DIAGNOSIS — D631 Anemia in chronic kidney disease: Secondary | ICD-10-CM | POA: Diagnosis not present

## 2021-09-10 DIAGNOSIS — T782XXS Anaphylactic shock, unspecified, sequela: Secondary | ICD-10-CM | POA: Diagnosis not present

## 2021-09-10 DIAGNOSIS — Z992 Dependence on renal dialysis: Secondary | ICD-10-CM | POA: Diagnosis not present

## 2021-09-10 DIAGNOSIS — N186 End stage renal disease: Secondary | ICD-10-CM | POA: Diagnosis not present

## 2021-09-13 ENCOUNTER — Encounter: Payer: Self-pay | Admitting: Orthopedic Surgery

## 2021-09-13 ENCOUNTER — Ambulatory Visit (INDEPENDENT_AMBULATORY_CARE_PROVIDER_SITE_OTHER): Payer: Medicare Other | Admitting: Orthopedic Surgery

## 2021-09-13 DIAGNOSIS — D631 Anemia in chronic kidney disease: Secondary | ICD-10-CM | POA: Diagnosis not present

## 2021-09-13 DIAGNOSIS — T782XXS Anaphylactic shock, unspecified, sequela: Secondary | ICD-10-CM | POA: Diagnosis not present

## 2021-09-13 DIAGNOSIS — R519 Headache, unspecified: Secondary | ICD-10-CM | POA: Diagnosis not present

## 2021-09-13 DIAGNOSIS — N186 End stage renal disease: Secondary | ICD-10-CM | POA: Diagnosis not present

## 2021-09-13 DIAGNOSIS — D689 Coagulation defect, unspecified: Secondary | ICD-10-CM | POA: Diagnosis not present

## 2021-09-13 DIAGNOSIS — R200 Anesthesia of skin: Secondary | ICD-10-CM

## 2021-09-13 DIAGNOSIS — Z4901 Encounter for fitting and adjustment of extracorporeal dialysis catheter: Secondary | ICD-10-CM | POA: Diagnosis not present

## 2021-09-13 DIAGNOSIS — Z992 Dependence on renal dialysis: Secondary | ICD-10-CM | POA: Diagnosis not present

## 2021-09-13 DIAGNOSIS — N2581 Secondary hyperparathyroidism of renal origin: Secondary | ICD-10-CM | POA: Diagnosis not present

## 2021-09-13 NOTE — Progress Notes (Signed)
New Patient Visit  Assessment: [name] is a 81 y.o. female with the following: 1. Numbness of right hand  Plan: [name] has a history of right open carpal tunnel release 4 years ago.  She is adamant her symptoms improved and she had intact sensation until recently.  She is complaining of dropping items now.  On exam, she has numbness in her index finger and thumb.  There is wasting in the thenar musculature bilaterally.  She also has wasting of the first webspace bilaterally.  We will obtain EMGs to see if we can pinpoint her issue.  We may have to refer her elsewhere due to her medical comorbidities.   Follow-up: Return for After EMG.  Subjective:  Chief Complaint  Patient presents with   Hand Pain    Right hand pain/numbness. Hx CTR about 4 yrs ago.    History of Present Illness: [name] is a 81 y.o. female who has been referred by  [name], FNP for evaluation of right hand numbness.  She states it has been progressively worsening over several months.  She is now dropping items.  She has difficulty holding onto things.  She complains of numbness in the thumb and index finger.  She did have carpal tunnel surgery approximately 4 years ago, and states that she had full resolution of her symptoms at that time.  EMG results are not available from that surgery.  She cannot remember if she had an EMG prior to the surgery.  Her finger stay numb  all the time.   Review of Systems: No fevers or chills + numbness and tingling No chest pain No shortness of breath No bowel or bladder dysfunction No GI distress No headaches   Medical History:  Past Medical History:  Diagnosis Date   Acute respiratory failure ([place]) 05/2017   Anemia    Anxiety    Arthritis    COPD (chronic obstructive pulmonary disease) (HCC)    Depression    ESRD (end stage renal disease) ([place])    dialysis MWF   GERD (gastroesophageal reflux disease)    Gout    History of blood transfusion     History of diabetes mellitus    "diet controlled"   HLD (hyperlipidemia)    HOH (hard of hearing)    left ear   Hypertension    hypotensive -since starting dialysis   Hypothyroidism    MRSA (methicillin resistant staph aureus) culture positive 06/01/2017   PAF (paroxysmal atrial fibrillation) ([place])    a. Echo 11/16:  Mild LVH, EF 55-60%, normal wall motion, MAC, mild MR, severe LAE (49 ml/m2), mild RVE, normal RVSF, mild RAE, mild TR, PASP 24 mmHg;  CHADS2-VASc: 4 >> Coumadin followed by PCP    Past Surgical History:  Procedure Laterality Date   AV FISTULA PLACEMENT  04/03/2012   Procedure: ARTERIOVENOUS (AV) FISTULA CREATION;  Surgeon: Elam Dutch, MD;  Location: [place];  Service: Vascular;  Laterality: Left;  creation left brachial cephalic fistula    BIOPSY  08/21/2018   Procedure: BIOPSY;  Surgeon: Thornton Park, MD;  Location: [place];  Service: Gastroenterology;;   CARPAL TUNNEL RELEASE Bilateral    COLONOSCOPY W/ POLYPECTOMY     COLONOSCOPY WITH PROPOFOL N/A 08/21/2018   Procedure: COLONOSCOPY WITH PROPOFOL;  Surgeon: Thornton Park, MD;  Location: [place];  Service: Gastroenterology;  Laterality: N/A;   DILATION AND CURETTAGE OF UTERUS     ESOPHAGOGASTRODUODENOSCOPY (EGD) WITH PROPOFOL N/A 08/21/2018  Procedure: ESOPHAGOGASTRODUODENOSCOPY (EGD) WITH PROPOFOL;  Surgeon: Thornton Park, MD;  Location: [place];  Service: Gastroenterology;  Laterality: N/A;   EYE SURGERY Bilateral    bilateral cataract removal   HEMATOMA EVACUATION Left 05/14/2018   Procedure: Incision and Drainage of Left Arm Hematoma;  Surgeon: Elam Dutch, MD;  Location: [place];  Service: Vascular;  Laterality: Left;   Hemodialysis  catheter Right    IR FLUORO GUIDE CV LINE RIGHT  05/26/2017   IR FLUORO GUIDE CV LINE RIGHT  04/10/2021   IR REMOVAL TUN CV CATH W/O FL  05/24/2017   IR REMOVAL TUN CV CATH W/O FL  04/10/2021   IR US GUIDE VASC ACCESS RIGHT  05/26/2017   IR US GUIDE VASC  ACCESS RIGHT  04/10/2021   LIGATION OF ARTERIOVENOUS  FISTULA Left 02/04/2015   Procedure: LIGATION OF BRACHIOCEPHALIC ARTERIOVENOUS  FISTULA;  Surgeon: Conrad LaFayette, MD;  Location: [place];  Service: Vascular;  Laterality: Left;   MULTIPLE TOOTH EXTRACTIONS     PARTIAL HIP ARTHROPLASTY     POLYPECTOMY  08/21/2018   Procedure: POLYPECTOMY;  Surgeon: Thornton Park, MD;  Location: [place];  Service: Gastroenterology;;   PORTACATH PLACEMENT     REVERSE SHOULDER ARTHROPLASTY Left 01/22/2016   Procedure: LEFT REVERSE SHOULDER ARTHROPLASTY;  Surgeon: Netta Cedars, MD;  Location: [facility];  Service: Orthopedics;  Laterality: Left;   SHOULDER ARTHROSCOPY Bilateral    SPLIT NIGHT STUDY  07/26/2015   STERIOD INJECTION Right 01/22/2016   Procedure: RIGHT RING FINGER STEROID INJECTION;  Surgeon: Netta Cedars, MD;  Location: [place];  Service: Orthopedics;  Laterality: Right;   TEE WITHOUT CARDIOVERSION N/A 05/26/2017   Procedure: TRANSESOPHAGEAL ECHOCARDIOGRAM (TEE);  Surgeon: Lelon Perla, MD;  Location: [facility];  Service: Cardiovascular;  Laterality: N/A;   THROMBECTOMY BRACHIAL ARTERY Left 02/06/2015   Procedure: EVACUATION OF LEFT ARM HEMATOMA;  Surgeon: Angelia Mould, MD;  Location: [place];  Service: Vascular;  Laterality: Left;   TOTAL KNEE ARTHROPLASTY Bilateral    TUBAL LIGATION      Family History  Problem Relation Age of Onset   Arthritis Mother    Diabetes Father        before age 31   Heart disease Father    Diabetes Sister    Cancer Brother    Hyperlipidemia Daughter    Hypertension Daughter    Diabetes Daughter    Hypertension Son    Hyperlipidemia Son    Dementia Sister    Cancer Sister        Unknown   Other Sister        Died in car accident   Stroke Brother    Diabetes Daughter    Hypertension Daughter    Hyperlipidemia Daughter    Hepatitis C Son    Social History   Tobacco Use   Smoking status: Former    Packs/day: 0.25    Years: 2.00    Total  pack years: 0.50    Types: Cigarettes    Quit date: 04/04/1998    Years since quitting: 23.4   Smokeless tobacco: Never  Vaping Use   Vaping Use: Never used  Substance Use Topics   Alcohol use: No    Alcohol/week: 0.0 standard drinks of alcohol   Drug use: No    Allergies  Allergen Reactions   Amoxicillin Rash    Has patient had a PCN reaction causing immediate rash, facial/tongue/throat swelling, SOB or lightheadedness with hypotension:YES Has patient had  a PCN reaction causing severe rash involving mucus membranes or skin necrosis: Yes Has patient had a PCN reaction that required hospitalization No Has patient had a PCN reaction occurring within the last 10 years: Yes If all of the above answers are "NO", then may proceed with Cephalosporin use. Cephalosporins ok   Elemental Sulfur Hives   Sulfur Hives   Peg 3350-Electrolytes Other (See Comments)   Sulfa Drugs Cross Reactors Hives and Itching   Miralax [Polyethylene Glycol] Itching   Mixed Grasses    Percocet [Oxycodone-Acetaminophen] Itching and Rash    Did not happen last time she took it   Sulfa Antibiotics Hives    Current Meds  Medication Sig   acetaminophen (TYLENOL) 325 MG tablet Take 2 tablets (650 mg total) by mouth every 6 (six) hours as needed for mild pain, fever or headache.   albuterol (VENTOLIN HFA) 108 (90 Base) MCG/ACT inhaler TAKE 2 PUFFS BY MOUTH EVERY 6 HOURS AS NEEDED FOR WHEEZE OR SHORTNESS OF BREATH   AURYXIA 1 GM 210 MG(Fe) tablet Take 420 mg by mouth 3 (three) times daily with meals.   cinacalcet (SENSIPAR) 30 MG tablet Take 1 tablet (30 mg total) by mouth every Monday, Wednesday, and Friday.   citalopram (CELEXA) 40 MG tablet Take 1 tablet (40 mg total) by mouth daily.   docusate calcium (SURFAK) 240 MG capsule Take 1 capsule (240 mg total) by mouth daily.   doxercalciferol (HECTOROL) 4 MCG/2ML injection Inject 0.5 mLs (1 mcg total) into the vein every Monday, Wednesday, and Friday with hemodialysis.    ezetimibe (ZETIA) 10 MG tablet TAKE 1 TABLET BY MOUTH EVERY DAY   famotidine (PEPCID) 10 MG tablet Take 1 tablet (10 mg total) by mouth daily.   hydrALAZINE (APRESOLINE) 10 MG tablet TAKE 1 TABLET BY MOUTH THREE TIMES A DAY   HYDROcodone-acetaminophen (NORCO) 10-325 MG tablet Take 1 tablet by mouth every 8 (eight) hours.   [START ON 09/23/2021] HYDROcodone-acetaminophen (NORCO) 10-325 MG tablet Take 1 tablet by mouth every 8 (eight) hours.   [START ON 10/23/2021] HYDROcodone-acetaminophen (NORCO) 10-325 MG tablet Take 1 tablet by mouth every 8 (eight) hours.   levocetirizine (XYZAL) 5 MG tablet TAKE 1 TABLET BY MOUTH EVERY DAY IN THE EVENING   levothyroxine (SYNTHROID) 125 MCG tablet Take 1 tablet (125 mcg total) by mouth daily.   methocarbamol (ROBAXIN) 500 MG tablet Take 1 tablet (500 mg total) by mouth every 8 (eight) hours as needed for muscle spasms.   multivitamin (RENA-VIT) TABS tablet Take 1 tablet by mouth daily.   nystatin cream (MYCOSTATIN) Apply 1 application topically 2 (two) times daily as needed for dry skin.   pantoprazole (PROTONIX) 20 MG tablet Take 1 tablet (20 mg total) by mouth daily.   pramipexole (MIRAPEX) 0.125 MG tablet Take 1 tablet (0.125 mg total) by mouth at bedtime.   SYMBICORT 80-4.5 MCG/ACT inhaler INHALE 2 PUFFS INTO THE LUNGS TWICE A DAY    Objective: There were no vitals taken for this visit.  Physical Exam:  General: Elderly female., Alert and oriented., No acute distress., and Seated in a wheelchair.  Right hand with atrophy within the thenar musculature.  Atrophy within the first webspace.  Decreased sensation to the index finger and thumb.  Negative Tinel's, she states is painful when this maneuver is being done.  She also has loss of musculature within the first webspace of the left hand.  She denies neck pain.  No radiating pains from her neck into her arms.  IMAGING: No new imaging obtained today   New Medications:  No orders of the  defined types were placed in this encounter.     Mordecai Rasmussen, MD  09/14/2021 8:22 AM

## 2021-09-14 ENCOUNTER — Encounter: Payer: Self-pay | Admitting: Orthopedic Surgery

## 2021-09-15 DIAGNOSIS — T782XXS Anaphylactic shock, unspecified, sequela: Secondary | ICD-10-CM | POA: Diagnosis not present

## 2021-09-15 DIAGNOSIS — R519 Headache, unspecified: Secondary | ICD-10-CM | POA: Diagnosis not present

## 2021-09-15 DIAGNOSIS — N186 End stage renal disease: Secondary | ICD-10-CM | POA: Diagnosis not present

## 2021-09-15 DIAGNOSIS — Z4901 Encounter for fitting and adjustment of extracorporeal dialysis catheter: Secondary | ICD-10-CM | POA: Diagnosis not present

## 2021-09-15 DIAGNOSIS — Z992 Dependence on renal dialysis: Secondary | ICD-10-CM | POA: Diagnosis not present

## 2021-09-15 DIAGNOSIS — D689 Coagulation defect, unspecified: Secondary | ICD-10-CM | POA: Diagnosis not present

## 2021-09-15 DIAGNOSIS — N2581 Secondary hyperparathyroidism of renal origin: Secondary | ICD-10-CM | POA: Diagnosis not present

## 2021-09-15 DIAGNOSIS — D631 Anemia in chronic kidney disease: Secondary | ICD-10-CM | POA: Diagnosis not present

## 2021-09-16 DIAGNOSIS — N2581 Secondary hyperparathyroidism of renal origin: Secondary | ICD-10-CM | POA: Diagnosis not present

## 2021-09-16 DIAGNOSIS — Z992 Dependence on renal dialysis: Secondary | ICD-10-CM | POA: Diagnosis not present

## 2021-09-16 DIAGNOSIS — E8770 Fluid overload, unspecified: Secondary | ICD-10-CM | POA: Diagnosis not present

## 2021-09-16 DIAGNOSIS — N186 End stage renal disease: Secondary | ICD-10-CM | POA: Diagnosis not present

## 2021-09-17 DIAGNOSIS — Z4901 Encounter for fitting and adjustment of extracorporeal dialysis catheter: Secondary | ICD-10-CM | POA: Diagnosis not present

## 2021-09-17 DIAGNOSIS — T782XXS Anaphylactic shock, unspecified, sequela: Secondary | ICD-10-CM | POA: Diagnosis not present

## 2021-09-17 DIAGNOSIS — Z992 Dependence on renal dialysis: Secondary | ICD-10-CM | POA: Diagnosis not present

## 2021-09-17 DIAGNOSIS — N2581 Secondary hyperparathyroidism of renal origin: Secondary | ICD-10-CM | POA: Diagnosis not present

## 2021-09-17 DIAGNOSIS — N186 End stage renal disease: Secondary | ICD-10-CM | POA: Diagnosis not present

## 2021-09-17 DIAGNOSIS — D689 Coagulation defect, unspecified: Secondary | ICD-10-CM | POA: Diagnosis not present

## 2021-09-17 DIAGNOSIS — D631 Anemia in chronic kidney disease: Secondary | ICD-10-CM | POA: Diagnosis not present

## 2021-09-17 DIAGNOSIS — R519 Headache, unspecified: Secondary | ICD-10-CM | POA: Diagnosis not present

## 2021-09-20 DIAGNOSIS — N186 End stage renal disease: Secondary | ICD-10-CM | POA: Diagnosis not present

## 2021-09-20 DIAGNOSIS — D689 Coagulation defect, unspecified: Secondary | ICD-10-CM | POA: Diagnosis not present

## 2021-09-20 DIAGNOSIS — D631 Anemia in chronic kidney disease: Secondary | ICD-10-CM | POA: Diagnosis not present

## 2021-09-20 DIAGNOSIS — T782XXS Anaphylactic shock, unspecified, sequela: Secondary | ICD-10-CM | POA: Diagnosis not present

## 2021-09-20 DIAGNOSIS — Z992 Dependence on renal dialysis: Secondary | ICD-10-CM | POA: Diagnosis not present

## 2021-09-20 DIAGNOSIS — R519 Headache, unspecified: Secondary | ICD-10-CM | POA: Diagnosis not present

## 2021-09-20 DIAGNOSIS — Z4901 Encounter for fitting and adjustment of extracorporeal dialysis catheter: Secondary | ICD-10-CM | POA: Diagnosis not present

## 2021-09-20 DIAGNOSIS — N2581 Secondary hyperparathyroidism of renal origin: Secondary | ICD-10-CM | POA: Diagnosis not present

## 2021-09-22 DIAGNOSIS — D689 Coagulation defect, unspecified: Secondary | ICD-10-CM | POA: Diagnosis not present

## 2021-09-22 DIAGNOSIS — Z4901 Encounter for fitting and adjustment of extracorporeal dialysis catheter: Secondary | ICD-10-CM | POA: Diagnosis not present

## 2021-09-22 DIAGNOSIS — T782XXS Anaphylactic shock, unspecified, sequela: Secondary | ICD-10-CM | POA: Diagnosis not present

## 2021-09-22 DIAGNOSIS — D631 Anemia in chronic kidney disease: Secondary | ICD-10-CM | POA: Diagnosis not present

## 2021-09-22 DIAGNOSIS — Z992 Dependence on renal dialysis: Secondary | ICD-10-CM | POA: Diagnosis not present

## 2021-09-22 DIAGNOSIS — N2581 Secondary hyperparathyroidism of renal origin: Secondary | ICD-10-CM | POA: Diagnosis not present

## 2021-09-22 DIAGNOSIS — N186 End stage renal disease: Secondary | ICD-10-CM | POA: Diagnosis not present

## 2021-09-22 DIAGNOSIS — R519 Headache, unspecified: Secondary | ICD-10-CM | POA: Diagnosis not present

## 2021-09-24 DIAGNOSIS — N2581 Secondary hyperparathyroidism of renal origin: Secondary | ICD-10-CM | POA: Diagnosis not present

## 2021-09-24 DIAGNOSIS — Z992 Dependence on renal dialysis: Secondary | ICD-10-CM | POA: Diagnosis not present

## 2021-09-24 DIAGNOSIS — N186 End stage renal disease: Secondary | ICD-10-CM | POA: Diagnosis not present

## 2021-09-24 DIAGNOSIS — D689 Coagulation defect, unspecified: Secondary | ICD-10-CM | POA: Diagnosis not present

## 2021-09-24 DIAGNOSIS — Z4901 Encounter for fitting and adjustment of extracorporeal dialysis catheter: Secondary | ICD-10-CM | POA: Diagnosis not present

## 2021-09-24 DIAGNOSIS — R519 Headache, unspecified: Secondary | ICD-10-CM | POA: Diagnosis not present

## 2021-09-24 DIAGNOSIS — T782XXS Anaphylactic shock, unspecified, sequela: Secondary | ICD-10-CM | POA: Diagnosis not present

## 2021-09-24 DIAGNOSIS — D631 Anemia in chronic kidney disease: Secondary | ICD-10-CM | POA: Diagnosis not present

## 2021-09-27 DIAGNOSIS — T782XXS Anaphylactic shock, unspecified, sequela: Secondary | ICD-10-CM | POA: Diagnosis not present

## 2021-09-27 DIAGNOSIS — Z992 Dependence on renal dialysis: Secondary | ICD-10-CM | POA: Diagnosis not present

## 2021-09-27 DIAGNOSIS — D689 Coagulation defect, unspecified: Secondary | ICD-10-CM | POA: Diagnosis not present

## 2021-09-27 DIAGNOSIS — Z4901 Encounter for fitting and adjustment of extracorporeal dialysis catheter: Secondary | ICD-10-CM | POA: Diagnosis not present

## 2021-09-27 DIAGNOSIS — N2581 Secondary hyperparathyroidism of renal origin: Secondary | ICD-10-CM | POA: Diagnosis not present

## 2021-09-27 DIAGNOSIS — N186 End stage renal disease: Secondary | ICD-10-CM | POA: Diagnosis not present

## 2021-09-27 DIAGNOSIS — D631 Anemia in chronic kidney disease: Secondary | ICD-10-CM | POA: Diagnosis not present

## 2021-09-27 DIAGNOSIS — R519 Headache, unspecified: Secondary | ICD-10-CM | POA: Diagnosis not present

## 2021-09-29 ENCOUNTER — Ambulatory Visit: Payer: Medicare Other | Admitting: Pharmacist

## 2021-09-29 DIAGNOSIS — D689 Coagulation defect, unspecified: Secondary | ICD-10-CM | POA: Diagnosis not present

## 2021-09-29 DIAGNOSIS — E559 Vitamin D deficiency, unspecified: Secondary | ICD-10-CM

## 2021-09-29 DIAGNOSIS — N186 End stage renal disease: Secondary | ICD-10-CM | POA: Diagnosis not present

## 2021-09-29 DIAGNOSIS — Z4901 Encounter for fitting and adjustment of extracorporeal dialysis catheter: Secondary | ICD-10-CM | POA: Diagnosis not present

## 2021-09-29 DIAGNOSIS — N2581 Secondary hyperparathyroidism of renal origin: Secondary | ICD-10-CM | POA: Diagnosis not present

## 2021-09-29 DIAGNOSIS — D631 Anemia in chronic kidney disease: Secondary | ICD-10-CM | POA: Diagnosis not present

## 2021-09-29 DIAGNOSIS — T782XXS Anaphylactic shock, unspecified, sequela: Secondary | ICD-10-CM | POA: Diagnosis not present

## 2021-09-29 DIAGNOSIS — Z992 Dependence on renal dialysis: Secondary | ICD-10-CM | POA: Diagnosis not present

## 2021-09-29 DIAGNOSIS — R519 Headache, unspecified: Secondary | ICD-10-CM | POA: Diagnosis not present

## 2021-09-29 DIAGNOSIS — M810 Age-related osteoporosis without current pathological fracture: Secondary | ICD-10-CM

## 2021-09-30 MED ORDER — EVENITY 105 MG/1.17ML ~~LOC~~ SOSY: 210.0000 mg | PREFILLED_SYRINGE | Freq: Once | SUBCUTANEOUS | 0 refills | Status: AC

## 2021-09-30 NOTE — Telephone Encounter (Signed)
Sent RX for [facility] to [facility] Likely will need a PA Will complete once we receive Message sent to evenity rep

## 2021-10-01 DIAGNOSIS — D689 Coagulation defect, unspecified: Secondary | ICD-10-CM | POA: Diagnosis not present

## 2021-10-01 DIAGNOSIS — Z4901 Encounter for fitting and adjustment of extracorporeal dialysis catheter: Secondary | ICD-10-CM | POA: Diagnosis not present

## 2021-10-01 DIAGNOSIS — D631 Anemia in chronic kidney disease: Secondary | ICD-10-CM | POA: Diagnosis not present

## 2021-10-01 DIAGNOSIS — N186 End stage renal disease: Secondary | ICD-10-CM | POA: Diagnosis not present

## 2021-10-01 DIAGNOSIS — E1122 Type 2 diabetes mellitus with diabetic chronic kidney disease: Secondary | ICD-10-CM | POA: Diagnosis not present

## 2021-10-01 DIAGNOSIS — Z992 Dependence on renal dialysis: Secondary | ICD-10-CM | POA: Diagnosis not present

## 2021-10-01 DIAGNOSIS — N2581 Secondary hyperparathyroidism of renal origin: Secondary | ICD-10-CM | POA: Diagnosis not present

## 2021-10-01 DIAGNOSIS — T782XXS Anaphylactic shock, unspecified, sequela: Secondary | ICD-10-CM | POA: Diagnosis not present

## 2021-10-01 DIAGNOSIS — R519 Headache, unspecified: Secondary | ICD-10-CM | POA: Diagnosis not present

## 2021-10-01 NOTE — Progress Notes (Signed)
  PharmD  Follow Up Note   09/29/2021 Name: [name] MRN: [id number] DOB: 1941-03-02   Referred by: [name], FNP Reason for referral : Chronic Care Management and Osteoporosis   Evenity (med for osteoporosis) x1 yr--difficult to treat in this 81yoF on HD with fluctuating calcium levels; will try this med due to low bone density and T-scores.  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  Will await PA, etc    Regina Eck, PharmD, BCPS Clinical Pharmacist, [place]  [facility] Phone [phone]

## 2021-10-04 DIAGNOSIS — Z992 Dependence on renal dialysis: Secondary | ICD-10-CM | POA: Diagnosis not present

## 2021-10-04 DIAGNOSIS — N185 Chronic kidney disease, stage 5: Secondary | ICD-10-CM | POA: Diagnosis not present

## 2021-10-04 DIAGNOSIS — D631 Anemia in chronic kidney disease: Secondary | ICD-10-CM | POA: Diagnosis not present

## 2021-10-04 DIAGNOSIS — Z4901 Encounter for fitting and adjustment of extracorporeal dialysis catheter: Secondary | ICD-10-CM | POA: Diagnosis not present

## 2021-10-04 DIAGNOSIS — N186 End stage renal disease: Secondary | ICD-10-CM | POA: Diagnosis not present

## 2021-10-04 DIAGNOSIS — N2581 Secondary hyperparathyroidism of renal origin: Secondary | ICD-10-CM | POA: Diagnosis not present

## 2021-10-04 DIAGNOSIS — E1121 Type 2 diabetes mellitus with diabetic nephropathy: Secondary | ICD-10-CM | POA: Diagnosis not present

## 2021-10-04 DIAGNOSIS — R519 Headache, unspecified: Secondary | ICD-10-CM | POA: Diagnosis not present

## 2021-10-04 DIAGNOSIS — D689 Coagulation defect, unspecified: Secondary | ICD-10-CM | POA: Diagnosis not present

## 2021-10-06 DIAGNOSIS — Z992 Dependence on renal dialysis: Secondary | ICD-10-CM | POA: Diagnosis not present

## 2021-10-06 DIAGNOSIS — D689 Coagulation defect, unspecified: Secondary | ICD-10-CM | POA: Diagnosis not present

## 2021-10-06 DIAGNOSIS — Z4901 Encounter for fitting and adjustment of extracorporeal dialysis catheter: Secondary | ICD-10-CM | POA: Diagnosis not present

## 2021-10-06 DIAGNOSIS — E1121 Type 2 diabetes mellitus with diabetic nephropathy: Secondary | ICD-10-CM | POA: Diagnosis not present

## 2021-10-06 DIAGNOSIS — N186 End stage renal disease: Secondary | ICD-10-CM | POA: Diagnosis not present

## 2021-10-06 DIAGNOSIS — D631 Anemia in chronic kidney disease: Secondary | ICD-10-CM | POA: Diagnosis not present

## 2021-10-06 DIAGNOSIS — N2581 Secondary hyperparathyroidism of renal origin: Secondary | ICD-10-CM | POA: Diagnosis not present

## 2021-10-06 DIAGNOSIS — R519 Headache, unspecified: Secondary | ICD-10-CM | POA: Diagnosis not present

## 2021-10-06 DIAGNOSIS — N185 Chronic kidney disease, stage 5: Secondary | ICD-10-CM | POA: Diagnosis not present

## 2021-10-07 ENCOUNTER — Telehealth: Payer: Self-pay | Admitting: Family Medicine

## 2021-10-07 DIAGNOSIS — M810 Age-related osteoporosis without current pathological fracture: Secondary | ICD-10-CM

## 2021-10-07 NOTE — Telephone Encounter (Signed)
Spoke with pharmacy.  States that the instructions say the frequency is once.  States they can not do it for one time frequency. Please advise- looks like Almyra Free sent this in

## 2021-10-07 NOTE — Telephone Encounter (Signed)
I feel like we need [name]'s input. It looks like she was speaking with the rep about this.

## 2021-10-07 NOTE — Telephone Encounter (Signed)
Hilda Blades called from [place] to get clarification on Romosozumab-aqqg (EVENITY) 105 MG/1.17ML SOSY injection    [phone]

## 2021-10-08 DIAGNOSIS — D689 Coagulation defect, unspecified: Secondary | ICD-10-CM | POA: Diagnosis not present

## 2021-10-08 DIAGNOSIS — E1121 Type 2 diabetes mellitus with diabetic nephropathy: Secondary | ICD-10-CM | POA: Diagnosis not present

## 2021-10-08 DIAGNOSIS — Z992 Dependence on renal dialysis: Secondary | ICD-10-CM | POA: Diagnosis not present

## 2021-10-08 DIAGNOSIS — Z4901 Encounter for fitting and adjustment of extracorporeal dialysis catheter: Secondary | ICD-10-CM | POA: Diagnosis not present

## 2021-10-08 DIAGNOSIS — D631 Anemia in chronic kidney disease: Secondary | ICD-10-CM | POA: Diagnosis not present

## 2021-10-08 DIAGNOSIS — R519 Headache, unspecified: Secondary | ICD-10-CM | POA: Diagnosis not present

## 2021-10-08 DIAGNOSIS — N186 End stage renal disease: Secondary | ICD-10-CM | POA: Diagnosis not present

## 2021-10-08 DIAGNOSIS — N185 Chronic kidney disease, stage 5: Secondary | ICD-10-CM | POA: Diagnosis not present

## 2021-10-08 DIAGNOSIS — N2581 Secondary hyperparathyroidism of renal origin: Secondary | ICD-10-CM | POA: Diagnosis not present

## 2021-10-08 MED ORDER — EVENITY 105 MG/1.17ML ~~LOC~~ SOSY: PREFILLED_SYRINGE | SUBCUTANEOUS | 0 refills | Status: DC

## 2021-10-08 NOTE — Addendum Note (Signed)
Addended by: Lottie Dawson D on: 10/08/2021 12:59 PM   Modules accepted: Orders

## 2021-10-08 NOTE — Telephone Encounter (Signed)
EVENITY directions -210 mg subQ once monthly for 12 months -prescription was correct, however I will clarify   [facility] [phone][facility] Rx accepted as list in Epic for evenity Requires PA

## 2021-10-11 DIAGNOSIS — Z992 Dependence on renal dialysis: Secondary | ICD-10-CM | POA: Diagnosis not present

## 2021-10-11 DIAGNOSIS — R519 Headache, unspecified: Secondary | ICD-10-CM | POA: Diagnosis not present

## 2021-10-11 DIAGNOSIS — N185 Chronic kidney disease, stage 5: Secondary | ICD-10-CM | POA: Diagnosis not present

## 2021-10-11 DIAGNOSIS — Z4901 Encounter for fitting and adjustment of extracorporeal dialysis catheter: Secondary | ICD-10-CM | POA: Diagnosis not present

## 2021-10-11 DIAGNOSIS — E1121 Type 2 diabetes mellitus with diabetic nephropathy: Secondary | ICD-10-CM | POA: Diagnosis not present

## 2021-10-11 DIAGNOSIS — D689 Coagulation defect, unspecified: Secondary | ICD-10-CM | POA: Diagnosis not present

## 2021-10-11 DIAGNOSIS — N186 End stage renal disease: Secondary | ICD-10-CM | POA: Diagnosis not present

## 2021-10-11 DIAGNOSIS — N2581 Secondary hyperparathyroidism of renal origin: Secondary | ICD-10-CM | POA: Diagnosis not present

## 2021-10-11 DIAGNOSIS — D631 Anemia in chronic kidney disease: Secondary | ICD-10-CM | POA: Diagnosis not present

## 2021-10-13 DIAGNOSIS — Z4901 Encounter for fitting and adjustment of extracorporeal dialysis catheter: Secondary | ICD-10-CM | POA: Diagnosis not present

## 2021-10-13 DIAGNOSIS — N2581 Secondary hyperparathyroidism of renal origin: Secondary | ICD-10-CM | POA: Diagnosis not present

## 2021-10-13 DIAGNOSIS — R519 Headache, unspecified: Secondary | ICD-10-CM | POA: Diagnosis not present

## 2021-10-13 DIAGNOSIS — D631 Anemia in chronic kidney disease: Secondary | ICD-10-CM | POA: Diagnosis not present

## 2021-10-13 DIAGNOSIS — E1121 Type 2 diabetes mellitus with diabetic nephropathy: Secondary | ICD-10-CM | POA: Diagnosis not present

## 2021-10-13 DIAGNOSIS — N185 Chronic kidney disease, stage 5: Secondary | ICD-10-CM | POA: Diagnosis not present

## 2021-10-13 DIAGNOSIS — D689 Coagulation defect, unspecified: Secondary | ICD-10-CM | POA: Diagnosis not present

## 2021-10-13 DIAGNOSIS — Z992 Dependence on renal dialysis: Secondary | ICD-10-CM | POA: Diagnosis not present

## 2021-10-13 DIAGNOSIS — N186 End stage renal disease: Secondary | ICD-10-CM | POA: Diagnosis not present

## 2021-10-15 DIAGNOSIS — N186 End stage renal disease: Secondary | ICD-10-CM | POA: Diagnosis not present

## 2021-10-15 DIAGNOSIS — N2581 Secondary hyperparathyroidism of renal origin: Secondary | ICD-10-CM | POA: Diagnosis not present

## 2021-10-15 DIAGNOSIS — Z992 Dependence on renal dialysis: Secondary | ICD-10-CM | POA: Diagnosis not present

## 2021-10-15 DIAGNOSIS — D689 Coagulation defect, unspecified: Secondary | ICD-10-CM | POA: Diagnosis not present

## 2021-10-15 DIAGNOSIS — R519 Headache, unspecified: Secondary | ICD-10-CM | POA: Diagnosis not present

## 2021-10-15 DIAGNOSIS — Z4901 Encounter for fitting and adjustment of extracorporeal dialysis catheter: Secondary | ICD-10-CM | POA: Diagnosis not present

## 2021-10-15 DIAGNOSIS — N185 Chronic kidney disease, stage 5: Secondary | ICD-10-CM | POA: Diagnosis not present

## 2021-10-15 DIAGNOSIS — D631 Anemia in chronic kidney disease: Secondary | ICD-10-CM | POA: Diagnosis not present

## 2021-10-15 DIAGNOSIS — E1121 Type 2 diabetes mellitus with diabetic nephropathy: Secondary | ICD-10-CM | POA: Diagnosis not present

## 2021-10-18 DIAGNOSIS — R519 Headache, unspecified: Secondary | ICD-10-CM | POA: Diagnosis not present

## 2021-10-18 DIAGNOSIS — Z4901 Encounter for fitting and adjustment of extracorporeal dialysis catheter: Secondary | ICD-10-CM | POA: Diagnosis not present

## 2021-10-18 DIAGNOSIS — Z992 Dependence on renal dialysis: Secondary | ICD-10-CM | POA: Diagnosis not present

## 2021-10-18 DIAGNOSIS — D631 Anemia in chronic kidney disease: Secondary | ICD-10-CM | POA: Diagnosis not present

## 2021-10-18 DIAGNOSIS — N186 End stage renal disease: Secondary | ICD-10-CM | POA: Diagnosis not present

## 2021-10-18 DIAGNOSIS — N2581 Secondary hyperparathyroidism of renal origin: Secondary | ICD-10-CM | POA: Diagnosis not present

## 2021-10-18 DIAGNOSIS — D689 Coagulation defect, unspecified: Secondary | ICD-10-CM | POA: Diagnosis not present

## 2021-10-18 DIAGNOSIS — E1121 Type 2 diabetes mellitus with diabetic nephropathy: Secondary | ICD-10-CM | POA: Diagnosis not present

## 2021-10-18 DIAGNOSIS — N185 Chronic kidney disease, stage 5: Secondary | ICD-10-CM | POA: Diagnosis not present

## 2021-10-20 DIAGNOSIS — E1121 Type 2 diabetes mellitus with diabetic nephropathy: Secondary | ICD-10-CM | POA: Diagnosis not present

## 2021-10-20 DIAGNOSIS — D631 Anemia in chronic kidney disease: Secondary | ICD-10-CM | POA: Diagnosis not present

## 2021-10-20 DIAGNOSIS — Z992 Dependence on renal dialysis: Secondary | ICD-10-CM | POA: Diagnosis not present

## 2021-10-20 DIAGNOSIS — N2581 Secondary hyperparathyroidism of renal origin: Secondary | ICD-10-CM | POA: Diagnosis not present

## 2021-10-20 DIAGNOSIS — R519 Headache, unspecified: Secondary | ICD-10-CM | POA: Diagnosis not present

## 2021-10-20 DIAGNOSIS — N185 Chronic kidney disease, stage 5: Secondary | ICD-10-CM | POA: Diagnosis not present

## 2021-10-20 DIAGNOSIS — N186 End stage renal disease: Secondary | ICD-10-CM | POA: Diagnosis not present

## 2021-10-20 DIAGNOSIS — Z4901 Encounter for fitting and adjustment of extracorporeal dialysis catheter: Secondary | ICD-10-CM | POA: Diagnosis not present

## 2021-10-20 DIAGNOSIS — D689 Coagulation defect, unspecified: Secondary | ICD-10-CM | POA: Diagnosis not present

## 2021-10-22 DIAGNOSIS — Z992 Dependence on renal dialysis: Secondary | ICD-10-CM | POA: Diagnosis not present

## 2021-10-22 DIAGNOSIS — Z4901 Encounter for fitting and adjustment of extracorporeal dialysis catheter: Secondary | ICD-10-CM | POA: Diagnosis not present

## 2021-10-22 DIAGNOSIS — D631 Anemia in chronic kidney disease: Secondary | ICD-10-CM | POA: Diagnosis not present

## 2021-10-22 DIAGNOSIS — D689 Coagulation defect, unspecified: Secondary | ICD-10-CM | POA: Diagnosis not present

## 2021-10-22 DIAGNOSIS — R519 Headache, unspecified: Secondary | ICD-10-CM | POA: Diagnosis not present

## 2021-10-22 DIAGNOSIS — N2581 Secondary hyperparathyroidism of renal origin: Secondary | ICD-10-CM | POA: Diagnosis not present

## 2021-10-22 DIAGNOSIS — E1121 Type 2 diabetes mellitus with diabetic nephropathy: Secondary | ICD-10-CM | POA: Diagnosis not present

## 2021-10-22 DIAGNOSIS — N186 End stage renal disease: Secondary | ICD-10-CM | POA: Diagnosis not present

## 2021-10-22 DIAGNOSIS — N185 Chronic kidney disease, stage 5: Secondary | ICD-10-CM | POA: Diagnosis not present

## 2021-10-25 ENCOUNTER — Encounter (HOSPITAL_COMMUNITY): Payer: Self-pay | Admitting: *Deleted

## 2021-10-25 DIAGNOSIS — E1121 Type 2 diabetes mellitus with diabetic nephropathy: Secondary | ICD-10-CM | POA: Diagnosis not present

## 2021-10-25 DIAGNOSIS — D631 Anemia in chronic kidney disease: Secondary | ICD-10-CM | POA: Diagnosis not present

## 2021-10-25 DIAGNOSIS — N186 End stage renal disease: Secondary | ICD-10-CM | POA: Diagnosis not present

## 2021-10-25 DIAGNOSIS — Z992 Dependence on renal dialysis: Secondary | ICD-10-CM | POA: Diagnosis not present

## 2021-10-25 DIAGNOSIS — Z4901 Encounter for fitting and adjustment of extracorporeal dialysis catheter: Secondary | ICD-10-CM | POA: Diagnosis not present

## 2021-10-25 DIAGNOSIS — R519 Headache, unspecified: Secondary | ICD-10-CM | POA: Diagnosis not present

## 2021-10-25 DIAGNOSIS — N2581 Secondary hyperparathyroidism of renal origin: Secondary | ICD-10-CM | POA: Diagnosis not present

## 2021-10-25 DIAGNOSIS — N185 Chronic kidney disease, stage 5: Secondary | ICD-10-CM | POA: Diagnosis not present

## 2021-10-25 DIAGNOSIS — D689 Coagulation defect, unspecified: Secondary | ICD-10-CM | POA: Diagnosis not present

## 2021-10-27 ENCOUNTER — Telehealth: Payer: Self-pay | Admitting: Family Medicine

## 2021-10-27 ENCOUNTER — Telehealth: Payer: Self-pay | Admitting: Pharmacist

## 2021-10-27 DIAGNOSIS — R519 Headache, unspecified: Secondary | ICD-10-CM | POA: Diagnosis not present

## 2021-10-27 DIAGNOSIS — N185 Chronic kidney disease, stage 5: Secondary | ICD-10-CM | POA: Diagnosis not present

## 2021-10-27 DIAGNOSIS — N2581 Secondary hyperparathyroidism of renal origin: Secondary | ICD-10-CM | POA: Diagnosis not present

## 2021-10-27 DIAGNOSIS — D689 Coagulation defect, unspecified: Secondary | ICD-10-CM | POA: Diagnosis not present

## 2021-10-27 DIAGNOSIS — N186 End stage renal disease: Secondary | ICD-10-CM | POA: Diagnosis not present

## 2021-10-27 DIAGNOSIS — Z4901 Encounter for fitting and adjustment of extracorporeal dialysis catheter: Secondary | ICD-10-CM | POA: Diagnosis not present

## 2021-10-27 DIAGNOSIS — E1121 Type 2 diabetes mellitus with diabetic nephropathy: Secondary | ICD-10-CM | POA: Diagnosis not present

## 2021-10-27 DIAGNOSIS — Z992 Dependence on renal dialysis: Secondary | ICD-10-CM | POA: Diagnosis not present

## 2021-10-27 DIAGNOSIS — D631 Anemia in chronic kidney disease: Secondary | ICD-10-CM | POA: Diagnosis not present

## 2021-10-28 ENCOUNTER — Ambulatory Visit: Payer: Medicare Other | Admitting: Pharmacist

## 2021-10-28 NOTE — Telephone Encounter (Signed)
Duplicate, encounter closed.

## 2021-10-28 NOTE — Telephone Encounter (Signed)
Marguerite Kiener [employer]aria [employer] - PA Case ID: [id number] Need help? Call us at [phone] Outcome Approvedon July 26 Request Reference Number: [id number]. EVENITY INJ '105MG'$  is approved through 04/03/2022. Your patient may now fill this prescription and it will be covered. Drug Evenity '105MG'$ /1.17ML syringes Form [employer] Medicare Part D Electronic Prior Authorization Form (2017 NCPDP)  Pharmacy informed

## 2021-10-29 ENCOUNTER — Telehealth: Payer: Self-pay | Admitting: Family Medicine

## 2021-10-29 DIAGNOSIS — E1121 Type 2 diabetes mellitus with diabetic nephropathy: Secondary | ICD-10-CM | POA: Diagnosis not present

## 2021-10-29 DIAGNOSIS — D689 Coagulation defect, unspecified: Secondary | ICD-10-CM | POA: Diagnosis not present

## 2021-10-29 DIAGNOSIS — Z992 Dependence on renal dialysis: Secondary | ICD-10-CM | POA: Diagnosis not present

## 2021-10-29 DIAGNOSIS — R519 Headache, unspecified: Secondary | ICD-10-CM | POA: Diagnosis not present

## 2021-10-29 DIAGNOSIS — D631 Anemia in chronic kidney disease: Secondary | ICD-10-CM | POA: Diagnosis not present

## 2021-10-29 DIAGNOSIS — Z4901 Encounter for fitting and adjustment of extracorporeal dialysis catheter: Secondary | ICD-10-CM | POA: Diagnosis not present

## 2021-10-29 DIAGNOSIS — N186 End stage renal disease: Secondary | ICD-10-CM | POA: Diagnosis not present

## 2021-10-29 DIAGNOSIS — N2581 Secondary hyperparathyroidism of renal origin: Secondary | ICD-10-CM | POA: Diagnosis not present

## 2021-10-29 DIAGNOSIS — N185 Chronic kidney disease, stage 5: Secondary | ICD-10-CM | POA: Diagnosis not present

## 2021-10-29 NOTE — Telephone Encounter (Signed)
I'm not sure which pharmacy to send this to. I know Almyra Free has been working on this. I am going to route this to her.

## 2021-10-29 NOTE — Telephone Encounter (Signed)
Baldo Ash says that the shot will be delivered to [facility] office on Thursday but she does not know how to give the pt the shot. Can Baldo Ash come by with pt to have triage show her how to give the shot? Please call back

## 2021-11-01 DIAGNOSIS — E1121 Type 2 diabetes mellitus with diabetic nephropathy: Secondary | ICD-10-CM | POA: Diagnosis not present

## 2021-11-01 DIAGNOSIS — D689 Coagulation defect, unspecified: Secondary | ICD-10-CM | POA: Diagnosis not present

## 2021-11-01 DIAGNOSIS — Z992 Dependence on renal dialysis: Secondary | ICD-10-CM | POA: Diagnosis not present

## 2021-11-01 DIAGNOSIS — D631 Anemia in chronic kidney disease: Secondary | ICD-10-CM | POA: Diagnosis not present

## 2021-11-01 DIAGNOSIS — E1122 Type 2 diabetes mellitus with diabetic chronic kidney disease: Secondary | ICD-10-CM | POA: Diagnosis not present

## 2021-11-01 DIAGNOSIS — Z4901 Encounter for fitting and adjustment of extracorporeal dialysis catheter: Secondary | ICD-10-CM | POA: Diagnosis not present

## 2021-11-01 DIAGNOSIS — R519 Headache, unspecified: Secondary | ICD-10-CM | POA: Diagnosis not present

## 2021-11-01 DIAGNOSIS — N185 Chronic kidney disease, stage 5: Secondary | ICD-10-CM | POA: Diagnosis not present

## 2021-11-01 DIAGNOSIS — N2581 Secondary hyperparathyroidism of renal origin: Secondary | ICD-10-CM | POA: Diagnosis not present

## 2021-11-01 DIAGNOSIS — N186 End stage renal disease: Secondary | ICD-10-CM | POA: Diagnosis not present

## 2021-11-02 ENCOUNTER — Other Ambulatory Visit: Payer: Self-pay | Admitting: Family Medicine

## 2021-11-02 ENCOUNTER — Ambulatory Visit (INDEPENDENT_AMBULATORY_CARE_PROVIDER_SITE_OTHER): Payer: Medicare Other | Admitting: Physical Medicine and Rehabilitation

## 2021-11-02 DIAGNOSIS — R202 Paresthesia of skin: Secondary | ICD-10-CM

## 2021-11-02 DIAGNOSIS — K219 Gastro-esophageal reflux disease without esophagitis: Secondary | ICD-10-CM

## 2021-11-02 NOTE — Telephone Encounter (Signed)
Please call patient when we receive shot.

## 2021-11-02 NOTE — Progress Notes (Signed)
Numeric Pain Rating Scale and Functional Assessment Average Pain 4   In the last MONTH (on 0-10 scale) has pain interfered with the following?  1. General activity like being  able to carry out your everyday physical activities such as walking, climbing stairs, carrying groceries, or moving a chair?  Rating(6)

## 2021-11-02 NOTE — Telephone Encounter (Signed)
Attempted to contact  When patient calls back let her know we can show her how to do the shot once we receive it.  Will need to be a nurse visit.   Previous message has been sent to Tracy and not responded yet.

## 2021-11-02 NOTE — Progress Notes (Signed)
[name] - 81 y.o. female MRN [id number]  Date of birth: [birth year]  Office Visit Note: Visit Date: 11/02/2021 PCP: [name], FNP Referred by: Mordecai Rasmussen, MD  Subjective: Chief Complaint  Patient presents with   Right Hand - Pain, Numbness   HPI:  [name] is a 81 y.o. female who comes in today at the request of Dr. Larena Glassman for electrodiagnostic study of the Right upper extremities.  Patient is Right hand dominant. She states it has been progressively worsening over several months.  She is now dropping items.  She has difficulty holding onto things.  She complains of numbness in the thumb and index finger.  She did have carpal tunnel release on the right hand probably more than 5 years ago and on the left hand maybe 4 years ago.  This sounds like it was done at the [facility].  She does not remember if electrodiagnostic studies were completed.  She reports her right hand and fingers are staying numb constantly.   ROS Otherwise per HPI.  Assessment & Plan: Visit Diagnoses:    ICD-10-CM   1. Paresthesia of skin  R20.2 NCV with EMG (electromyography)      Plan: Impression: The above electrodiagnostic study is ABNORMAL and reveals evidence of:  a severe right median nerve entrapment at the wrist (carpal tunnel syndrome) affecting sensory and motor components.  There is significant evidence of axonal damage.  Despite appropriate decompression care likely would have residual symptoms.  There is electrodiagnostic evidence consistent with peripheral polyneuropathy.  If this needed to be looked at more with electrodiagnostic study I would suggest follow-up with neurology for more extensive study.  There is no significant electrodiagnostic evidence of any other focal nerve entrapment, brachial plexopathy or cervical radiculopathy.   Recommendations: 1.  Follow-up with referring physician. 2.  Continue current management of symptoms. 3.  Continue use of resting splint  at night-time and as needed during the day. 4.  Suggest surgical evaluation.  Meds & Orders: No orders of the defined types were placed in this encounter.   Orders Placed This Encounter  Procedures   NCV with EMG (electromyography)    Follow-up: Return in about 2 weeks (around 11/16/2021) for Larena Glassman, MD.   Procedures: No procedures performed  EMG & NCV Findings: Evaluation of the right median motor nerve showed prolonged distal onset latency (9.1 ms), reduced amplitude (0.1 mV), and decreased conduction velocity (Elbow-Wrist, 40 m/s).  The right ulnar motor nerve showed prolonged distal onset latency (5.2 ms) and decreased conduction velocity (B Elbow-Wrist, 49 m/s).  The right median (across palm) sensory nerve showed no response (Wrist) and prolonged distal peak latency (Palm, 2.3 ms).  The right ulnar sensory nerve showed prolonged distal peak latency (5.0 ms), reduced amplitude (1.9 V), and decreased conduction velocity (Wrist-5th Digit, 28 m/s).  All remaining nerves (as indicated in the following tables) were within normal limits.    Needle evaluation of the right abductor pollicis brevis muscle showed increased insertional activity, increased spontaneous activity, and diminished recruitment.  All remaining muscles (as indicated in the following table) showed no evidence of electrical instability.    Impression: The above electrodiagnostic study is ABNORMAL and reveals evidence of:  a severe right median nerve entrapment at the wrist (carpal tunnel syndrome) affecting sensory and motor components.  There is significant evidence of axonal damage.  Despite appropriate decompression care likely would have residual symptoms.  There is electrodiagnostic evidence consistent with  peripheral polyneuropathy.  If this needed to be looked at more with electrodiagnostic study I would suggest follow-up with neurology for more extensive study.  There is no significant electrodiagnostic evidence  of any other focal nerve entrapment, brachial plexopathy or cervical radiculopathy.   Recommendations: 1.  Follow-up with referring physician. 2.  Continue current management of symptoms. 3.  Continue use of resting splint at night-time and as needed during the day. 4.  Suggest surgical evaluation.  ___________________________ Laurence Spates FAAPMR Board Certified, American Board of Physical Medicine and Rehabilitation    Nerve Conduction Studies Anti Sensory Summary Table   Stim Site NR Peak (ms) Norm Peak (ms) P-T Amp (V) Norm P-T Amp Site1 Site2 Delta-P (ms) Dist (cm) Vel (m/s) Norm Vel (m/s)  Right Median Acr Palm Anti Sensory (2nd Digit)  30.3C  Wrist *NR  <3.6  >10 Wrist Palm  0.0    Palm    *2.3 <2.0 5.7         Right Radial Anti Sensory (Base 1st Digit)  30.8C  Wrist    2.5 <3.1 14.8  Wrist Base 1st Digit 2.5 0.0    Right Ulnar Anti Sensory (5th Digit)  30.9C  Wrist    *5.0 <3.7 *1.9 >15.0 Wrist 5th Digit 5.0 14.0 *28 >38   Motor Summary Table   Stim Site NR Onset (ms) Norm Onset (ms) O-P Amp (mV) Norm O-P Amp Site1 Site2 Delta-0 (ms) Dist (cm) Vel (m/s) Norm Vel (m/s)  Right Median Motor (Abd Poll Brev)  30.6C  Wrist    *9.1 <4.2 *0.1 >5 Elbow Wrist 6.1 24.5 *40 >50  Elbow    15.2  0.5         Right Ulnar Motor (Abd Dig Min)  30.6C  Wrist    *5.2 <4.2 3.8 >3 B Elbow Wrist 4.3 21.0 *49 >53  B Elbow    9.5  2.4  A Elbow B Elbow 1.8 10.0 56 >53  A Elbow    11.3  0.7          EMG   Side Muscle Nerve Root Ins Act Fibs Psw Amp Dur Poly Recrt Int Fraser Din Comment  Right Abd Poll Brev Median C8-T1 *Incr *3+ *3+ Nml Nml 0 *Reduced Nml   Right 1stDorInt Ulnar C8-T1 Nml Nml Nml Nml Nml 0 Nml Nml   Right PronatorTeres Median C6-7 Nml Nml Nml Nml Nml 0 Nml Nml   Right Biceps Musculocut C5-6 Nml Nml Nml Nml Nml 0 Nml Nml   Right Deltoid Axillary C5-6 Nml Nml Nml Nml Nml 0 Nml Nml     Nerve Conduction Studies Anti Sensory Left/Right Comparison   Stim Site L Lat (ms) R Lat  (ms) L-R Lat (ms) L Amp (V) R Amp (V) L-R Amp (%) Site1 Site2 L Vel (m/s) R Vel (m/s) L-R Vel (m/s)  Median Acr Palm Anti Sensory (2nd Digit)  30.3C  Wrist       Wrist Palm     Palm  *2.3   5.7        Radial Anti Sensory (Base 1st Digit)  30.8C  Wrist  2.5   14.8  Wrist Base 1st Digit     Ulnar Anti Sensory (5th Digit)  30.9C  Wrist  *5.0   *1.9  Wrist 5th Digit  *28    Motor Left/Right Comparison   Stim Site L Lat (ms) R Lat (ms) L-R Lat (ms) L Amp (mV) R Amp (mV) L-R Amp (%) Site1 Site2 L Vel (  m/s) R Vel (m/s) L-R Vel (m/s)  Median Motor (Abd Poll Brev)  30.6C  Wrist  *9.1   *0.1  Elbow Wrist  *40   Elbow  15.2   0.5        Ulnar Motor (Abd Dig Min)  30.6C  Wrist  *5.2   3.8  B Elbow Wrist  *49   B Elbow  9.5   2.4  A Elbow B Elbow  56   A Elbow  11.3   0.7           Waveforms:             Clinical History: No specialty comments available.     Objective:  VS:  HT:    WT:   BMI:     BP:   HR: bpm  TEMP: ( )  RESP:  Physical Exam Musculoskeletal:        General: No swelling, tenderness or deformity.     Comments: Patient sitting in wheelchair today.  Inspection reveals atrophy of the bilateral APB right much more than left with some atrophy of the right FDI but no atrophy of the left FDI or hand intrinsics. There is no swelling, color changes, allodynia or dystrophic changes. There is 5 out of 5 strength in the bilateral wrist extension, finger abduction and long finger flexion.  There is decreased sensation in the right median nerve distribution..  There is a negative Hoffmann's test bilaterally.  Skin:    General: Skin is warm and dry.     Findings: No erythema or rash.  Neurological:     General: No focal deficit present.     Mental Status: She is alert and oriented to person, place, and time.     Motor: No weakness or abnormal muscle tone.     Coordination: Coordination normal.  Psychiatric:        Mood and Affect: Mood normal.        Behavior:  Behavior normal.      Imaging: No results found.

## 2021-11-03 DIAGNOSIS — N2581 Secondary hyperparathyroidism of renal origin: Secondary | ICD-10-CM | POA: Diagnosis not present

## 2021-11-03 DIAGNOSIS — R519 Headache, unspecified: Secondary | ICD-10-CM | POA: Diagnosis not present

## 2021-11-03 DIAGNOSIS — N186 End stage renal disease: Secondary | ICD-10-CM | POA: Diagnosis not present

## 2021-11-03 DIAGNOSIS — D689 Coagulation defect, unspecified: Secondary | ICD-10-CM | POA: Diagnosis not present

## 2021-11-03 DIAGNOSIS — Z4901 Encounter for fitting and adjustment of extracorporeal dialysis catheter: Secondary | ICD-10-CM | POA: Diagnosis not present

## 2021-11-03 DIAGNOSIS — Z992 Dependence on renal dialysis: Secondary | ICD-10-CM | POA: Diagnosis not present

## 2021-11-03 NOTE — Telephone Encounter (Signed)
Yes! Thank you!  Please let patient know we have not received medication We will call and schedule her with our triage as soon as we know

## 2021-11-03 NOTE — Telephone Encounter (Signed)
Calcium level from dialysis- Is this okay to start medication?

## 2021-11-03 NOTE — Telephone Encounter (Signed)
Call returned to patient's daughter Baldo Ash Informed them that we will call them and set up time for injection education via triage when [facility] No action needed at this time

## 2021-11-03 NOTE — Telephone Encounter (Signed)
Aware and verbalizes understanding.  

## 2021-11-03 NOTE — Telephone Encounter (Signed)
Calcium is 8.9

## 2021-11-04 NOTE — Telephone Encounter (Signed)
Incoming call from [facility] [phone] [name] will ship to our office next Wednesday 11/10/21 Please let patient's daughter know we will call her to set up injection time when we have medicine  They will have to call and order this medicine every month (for 1 year)

## 2021-11-04 NOTE — Telephone Encounter (Signed)
Aware and verbalizes understanding.  

## 2021-11-05 DIAGNOSIS — N186 End stage renal disease: Secondary | ICD-10-CM | POA: Diagnosis not present

## 2021-11-05 DIAGNOSIS — R519 Headache, unspecified: Secondary | ICD-10-CM | POA: Diagnosis not present

## 2021-11-05 DIAGNOSIS — Z4901 Encounter for fitting and adjustment of extracorporeal dialysis catheter: Secondary | ICD-10-CM | POA: Diagnosis not present

## 2021-11-05 DIAGNOSIS — D689 Coagulation defect, unspecified: Secondary | ICD-10-CM | POA: Diagnosis not present

## 2021-11-05 DIAGNOSIS — Z992 Dependence on renal dialysis: Secondary | ICD-10-CM | POA: Diagnosis not present

## 2021-11-05 DIAGNOSIS — N2581 Secondary hyperparathyroidism of renal origin: Secondary | ICD-10-CM | POA: Diagnosis not present

## 2021-11-05 NOTE — Procedures (Signed)
EMG & NCV Findings: Evaluation of the right median motor nerve showed prolonged distal onset latency (9.1 ms), reduced amplitude (0.1 mV), and decreased conduction velocity (Elbow-Wrist, 40 m/s).  The right ulnar motor nerve showed prolonged distal onset latency (5.2 ms) and decreased conduction velocity (B Elbow-Wrist, 49 m/s).  The right median (across palm) sensory nerve showed no response (Wrist) and prolonged distal peak latency (Palm, 2.3 ms).  The right ulnar sensory nerve showed prolonged distal peak latency (5.0 ms), reduced amplitude (1.9 V), and decreased conduction velocity (Wrist-5th Digit, 28 m/s).  All remaining nerves (as indicated in the following tables) were within normal limits.    Needle evaluation of the right abductor pollicis brevis muscle showed increased insertional activity, increased spontaneous activity, and diminished recruitment.  All remaining muscles (as indicated in the following table) showed no evidence of electrical instability.    Impression: The above electrodiagnostic study is ABNORMAL and reveals evidence of:  a severe right median nerve entrapment at the wrist (carpal tunnel syndrome) affecting sensory and motor components.  There is significant evidence of axonal damage.  Despite appropriate decompression care likely would have residual symptoms.  There is electrodiagnostic evidence consistent with peripheral polyneuropathy.  If this needed to be looked at more with electrodiagnostic study I would suggest follow-up with neurology for more extensive study.  There is no significant electrodiagnostic evidence of any other focal nerve entrapment, brachial plexopathy or cervical radiculopathy.   Recommendations: 1.  Follow-up with referring physician. 2.  Continue current management of symptoms. 3.  Continue use of resting splint at night-time and as needed during the day. 4.  Suggest surgical evaluation.  ___________________________ Laurence Spates  FAAPMR Board Certified, American Board of Physical Medicine and Rehabilitation    Nerve Conduction Studies Anti Sensory Summary Table   Stim Site NR Peak (ms) Norm Peak (ms) P-T Amp (V) Norm P-T Amp Site1 Site2 Delta-P (ms) Dist (cm) Vel (m/s) Norm Vel (m/s)  Right Median Acr Palm Anti Sensory (2nd Digit)  30.3C  Wrist *NR  <3.6  >10 Wrist Palm  0.0    Palm    *2.3 <2.0 5.7         Right Radial Anti Sensory (Base 1st Digit)  30.8C  Wrist    2.5 <3.1 14.8  Wrist Base 1st Digit 2.5 0.0    Right Ulnar Anti Sensory (5th Digit)  30.9C  Wrist    *5.0 <3.7 *1.9 >15.0 Wrist 5th Digit 5.0 14.0 *28 >38   Motor Summary Table   Stim Site NR Onset (ms) Norm Onset (ms) O-P Amp (mV) Norm O-P Amp Site1 Site2 Delta-0 (ms) Dist (cm) Vel (m/s) Norm Vel (m/s)  Right Median Motor (Abd Poll Brev)  30.6C  Wrist    *9.1 <4.2 *0.1 >5 Elbow Wrist 6.1 24.5 *40 >50  Elbow    15.2  0.5         Right Ulnar Motor (Abd Dig Min)  30.6C  Wrist    *5.2 <4.2 3.8 >3 B Elbow Wrist 4.3 21.0 *49 >53  B Elbow    9.5  2.4  A Elbow B Elbow 1.8 10.0 56 >53  A Elbow    11.3  0.7          EMG   Side Muscle Nerve Root Ins Act Fibs Psw Amp Dur Poly Recrt Int Fraser Din Comment  Right Abd Poll Brev Median C8-T1 *Incr *3+ *3+ Nml Nml 0 *Reduced Nml   Right 1stDorInt Ulnar C8-T1 Nml Nml Nml Nml  Nml 0 Nml Nml   Right PronatorTeres Median C6-7 Nml Nml Nml Nml Nml 0 Nml Nml   Right Biceps Musculocut C5-6 Nml Nml Nml Nml Nml 0 Nml Nml   Right Deltoid Axillary C5-6 Nml Nml Nml Nml Nml 0 Nml Nml     Nerve Conduction Studies Anti Sensory Left/Right Comparison   Stim Site L Lat (ms) R Lat (ms) L-R Lat (ms) L Amp (V) R Amp (V) L-R Amp (%) Site1 Site2 L Vel (m/s) R Vel (m/s) L-R Vel (m/s)  Median Acr Palm Anti Sensory (2nd Digit)  30.3C  Wrist       Wrist Palm     Palm  *2.3   5.7        Radial Anti Sensory (Base 1st Digit)  30.8C  Wrist  2.5   14.8  Wrist Base 1st Digit     Ulnar Anti Sensory (5th Digit)  30.9C  Wrist  *5.0    *1.9  Wrist 5th Digit  *28    Motor Left/Right Comparison   Stim Site L Lat (ms) R Lat (ms) L-R Lat (ms) L Amp (mV) R Amp (mV) L-R Amp (%) Site1 Site2 L Vel (m/s) R Vel (m/s) L-R Vel (m/s)  Median Motor (Abd Poll Brev)  30.6C  Wrist  *9.1   *0.1  Elbow Wrist  *40   Elbow  15.2   0.5        Ulnar Motor (Abd Dig Min)  30.6C  Wrist  *5.2   3.8  B Elbow Wrist  *49   B Elbow  9.5   2.4  A Elbow B Elbow  56   A Elbow  11.3   0.7           Waveforms:

## 2021-11-08 DIAGNOSIS — R519 Headache, unspecified: Secondary | ICD-10-CM | POA: Diagnosis not present

## 2021-11-08 DIAGNOSIS — N2581 Secondary hyperparathyroidism of renal origin: Secondary | ICD-10-CM | POA: Diagnosis not present

## 2021-11-08 DIAGNOSIS — Z992 Dependence on renal dialysis: Secondary | ICD-10-CM | POA: Diagnosis not present

## 2021-11-08 DIAGNOSIS — D689 Coagulation defect, unspecified: Secondary | ICD-10-CM | POA: Diagnosis not present

## 2021-11-08 DIAGNOSIS — Z4901 Encounter for fitting and adjustment of extracorporeal dialysis catheter: Secondary | ICD-10-CM | POA: Diagnosis not present

## 2021-11-08 DIAGNOSIS — N186 End stage renal disease: Secondary | ICD-10-CM | POA: Diagnosis not present

## 2021-11-10 ENCOUNTER — Ambulatory Visit (INDEPENDENT_AMBULATORY_CARE_PROVIDER_SITE_OTHER): Payer: Medicare Other | Admitting: *Deleted

## 2021-11-10 DIAGNOSIS — Z4901 Encounter for fitting and adjustment of extracorporeal dialysis catheter: Secondary | ICD-10-CM | POA: Diagnosis not present

## 2021-11-10 DIAGNOSIS — Z992 Dependence on renal dialysis: Secondary | ICD-10-CM | POA: Diagnosis not present

## 2021-11-10 DIAGNOSIS — R519 Headache, unspecified: Secondary | ICD-10-CM | POA: Diagnosis not present

## 2021-11-10 DIAGNOSIS — M810 Age-related osteoporosis without current pathological fracture: Secondary | ICD-10-CM | POA: Diagnosis not present

## 2021-11-10 DIAGNOSIS — N2581 Secondary hyperparathyroidism of renal origin: Secondary | ICD-10-CM | POA: Diagnosis not present

## 2021-11-10 DIAGNOSIS — D689 Coagulation defect, unspecified: Secondary | ICD-10-CM | POA: Diagnosis not present

## 2021-11-10 DIAGNOSIS — N186 End stage renal disease: Secondary | ICD-10-CM | POA: Diagnosis not present

## 2021-11-10 MED ORDER — ROMOSOZUMAB-AQQG 105 MG/1.17ML ~~LOC~~ SOSY
210.0000 mg | PREFILLED_SYRINGE | Freq: Once | SUBCUTANEOUS | Status: AC
  Administered 2021-11-10: 210 mg via SUBCUTANEOUS

## 2021-11-11 ENCOUNTER — Other Ambulatory Visit: Payer: Self-pay | Admitting: Family Medicine

## 2021-11-11 DIAGNOSIS — J449 Chronic obstructive pulmonary disease, unspecified: Secondary | ICD-10-CM

## 2021-11-12 DIAGNOSIS — Z992 Dependence on renal dialysis: Secondary | ICD-10-CM | POA: Diagnosis not present

## 2021-11-12 DIAGNOSIS — Z4901 Encounter for fitting and adjustment of extracorporeal dialysis catheter: Secondary | ICD-10-CM | POA: Diagnosis not present

## 2021-11-12 DIAGNOSIS — R519 Headache, unspecified: Secondary | ICD-10-CM | POA: Diagnosis not present

## 2021-11-12 DIAGNOSIS — N186 End stage renal disease: Secondary | ICD-10-CM | POA: Diagnosis not present

## 2021-11-12 DIAGNOSIS — D689 Coagulation defect, unspecified: Secondary | ICD-10-CM | POA: Diagnosis not present

## 2021-11-12 DIAGNOSIS — N2581 Secondary hyperparathyroidism of renal origin: Secondary | ICD-10-CM | POA: Diagnosis not present

## 2021-11-15 ENCOUNTER — Ambulatory Visit (INDEPENDENT_AMBULATORY_CARE_PROVIDER_SITE_OTHER): Payer: Medicare Other | Admitting: Orthopedic Surgery

## 2021-11-15 ENCOUNTER — Encounter: Payer: Self-pay | Admitting: Orthopedic Surgery

## 2021-11-15 DIAGNOSIS — N2581 Secondary hyperparathyroidism of renal origin: Secondary | ICD-10-CM | POA: Diagnosis not present

## 2021-11-15 DIAGNOSIS — N186 End stage renal disease: Secondary | ICD-10-CM | POA: Diagnosis not present

## 2021-11-15 DIAGNOSIS — Z992 Dependence on renal dialysis: Secondary | ICD-10-CM | POA: Diagnosis not present

## 2021-11-15 DIAGNOSIS — Z4901 Encounter for fitting and adjustment of extracorporeal dialysis catheter: Secondary | ICD-10-CM | POA: Diagnosis not present

## 2021-11-15 DIAGNOSIS — R519 Headache, unspecified: Secondary | ICD-10-CM | POA: Diagnosis not present

## 2021-11-15 DIAGNOSIS — R200 Anesthesia of skin: Secondary | ICD-10-CM

## 2021-11-15 DIAGNOSIS — D689 Coagulation defect, unspecified: Secondary | ICD-10-CM | POA: Diagnosis not present

## 2021-11-15 NOTE — Progress Notes (Signed)
Return Patient Visit  Assessment: [name] is a 81 y.o. female with the following: 1. Numbness of right hand; evidence of axonal damage on EMG  Plan: [name] has a history of right open carpal tunnel release 5 years ago.  EMG completed by Dr. Ernestina Patches demonstrates severe median nerve compression, with evidence of axonal nerve damage.  There is also concern for peripheral polyneuropathy.  This was discussed with the patient and family in clinic today.  Based on the EMG findings, there is no guarantee that she would have substantial relief of her symptoms.  She has atrophy on physical exam.  In addition, she has multiple medical comorbidities, which would likely prevent her from having surgery at any pain.  I offered a hand referral, to hand specialist, or return to see Dr. Burney Gauze for repeat evaluation.  At this time, they will continue with her current therapy.  No further follow-up is needed.  If they want to proceed with a referral, they will contact clinic.  Follow-up: Return if symptoms worsen or fail to improve.  Subjective:  Chief Complaint  Patient presents with   Hand Pain    RT//follow up same    History of Present Illness: [name] is a 81 y.o. female who returns to clinic for repeat evaluation of right hand numbness.  Briefly, she had right open carpal tunnel release, approximately 5 years ago.  She had improvement of her symptoms, but has had progressively worsening numbness in her right hand.  EMG results were completed by Dr. Ernestina Patches, and she is here to discuss the results.  She has noticed atrophy, and persistent numbness in the right hand.  Numbness is typically within the median nerve distribution.  Review of Systems: No fevers or chills + numbness and tingling No chest pain No shortness of breath No bowel or bladder dysfunction No GI distress No headaches    Objective: There were no vitals taken for this visit.  Physical Exam:  General: Elderly  female., Alert and oriented., No acute distress., and Seated in a wheelchair.  Right hand with atrophy within the thenar musculature.  Atrophy within the first webspace.  Decreased sensation to the index finger and thumb.  Negative Tinel's, she states is painful when this maneuver is being done.  She also has loss of musculature within the first webspace of the left hand.  She denies neck pain.  No radiating pains from her neck into her arms.    IMAGING: No new imaging obtained today   EMG impression  The above electrodiagnostic study is ABNORMAL and reveals evidence of:   a severe right median nerve entrapment at the wrist (carpal tunnel syndrome) affecting sensory and motor components.  There is significant evidence of axonal damage.  Despite appropriate decompression care likely would have residual symptoms.   There is electrodiagnostic evidence consistent with peripheral polyneuropathy.  If this needed to be looked at more with electrodiagnostic study I would suggest follow-up with neurology for more extensive study.   There is no significant electrodiagnostic evidence of any other focal nerve entrapment, brachial plexopathy or cervical radiculopathy.  New Medications:  No orders of the defined types were placed in this encounter.     Mordecai Rasmussen, MD  11/15/2021 3:40 PM

## 2021-11-17 DIAGNOSIS — Z992 Dependence on renal dialysis: Secondary | ICD-10-CM | POA: Diagnosis not present

## 2021-11-17 DIAGNOSIS — N2581 Secondary hyperparathyroidism of renal origin: Secondary | ICD-10-CM | POA: Diagnosis not present

## 2021-11-17 DIAGNOSIS — N186 End stage renal disease: Secondary | ICD-10-CM | POA: Diagnosis not present

## 2021-11-17 DIAGNOSIS — R519 Headache, unspecified: Secondary | ICD-10-CM | POA: Diagnosis not present

## 2021-11-17 DIAGNOSIS — D689 Coagulation defect, unspecified: Secondary | ICD-10-CM | POA: Diagnosis not present

## 2021-11-17 DIAGNOSIS — Z4901 Encounter for fitting and adjustment of extracorporeal dialysis catheter: Secondary | ICD-10-CM | POA: Diagnosis not present

## 2021-11-18 ENCOUNTER — Other Ambulatory Visit: Payer: Self-pay | Admitting: Family Medicine

## 2021-11-18 DIAGNOSIS — M810 Age-related osteoporosis without current pathological fracture: Secondary | ICD-10-CM

## 2021-11-19 DIAGNOSIS — N2581 Secondary hyperparathyroidism of renal origin: Secondary | ICD-10-CM | POA: Diagnosis not present

## 2021-11-19 DIAGNOSIS — Z4901 Encounter for fitting and adjustment of extracorporeal dialysis catheter: Secondary | ICD-10-CM | POA: Diagnosis not present

## 2021-11-19 DIAGNOSIS — Z992 Dependence on renal dialysis: Secondary | ICD-10-CM | POA: Diagnosis not present

## 2021-11-19 DIAGNOSIS — R519 Headache, unspecified: Secondary | ICD-10-CM | POA: Diagnosis not present

## 2021-11-19 DIAGNOSIS — D689 Coagulation defect, unspecified: Secondary | ICD-10-CM | POA: Diagnosis not present

## 2021-11-19 DIAGNOSIS — N186 End stage renal disease: Secondary | ICD-10-CM | POA: Diagnosis not present

## 2021-11-22 DIAGNOSIS — R519 Headache, unspecified: Secondary | ICD-10-CM | POA: Diagnosis not present

## 2021-11-22 DIAGNOSIS — D689 Coagulation defect, unspecified: Secondary | ICD-10-CM | POA: Diagnosis not present

## 2021-11-22 DIAGNOSIS — N186 End stage renal disease: Secondary | ICD-10-CM | POA: Diagnosis not present

## 2021-11-22 DIAGNOSIS — N2581 Secondary hyperparathyroidism of renal origin: Secondary | ICD-10-CM | POA: Diagnosis not present

## 2021-11-22 DIAGNOSIS — Z992 Dependence on renal dialysis: Secondary | ICD-10-CM | POA: Diagnosis not present

## 2021-11-22 DIAGNOSIS — Z4901 Encounter for fitting and adjustment of extracorporeal dialysis catheter: Secondary | ICD-10-CM | POA: Diagnosis not present

## 2021-11-24 DIAGNOSIS — R519 Headache, unspecified: Secondary | ICD-10-CM | POA: Diagnosis not present

## 2021-11-24 DIAGNOSIS — N186 End stage renal disease: Secondary | ICD-10-CM | POA: Diagnosis not present

## 2021-11-24 DIAGNOSIS — Z4901 Encounter for fitting and adjustment of extracorporeal dialysis catheter: Secondary | ICD-10-CM | POA: Diagnosis not present

## 2021-11-24 DIAGNOSIS — N2581 Secondary hyperparathyroidism of renal origin: Secondary | ICD-10-CM | POA: Diagnosis not present

## 2021-11-24 DIAGNOSIS — Z992 Dependence on renal dialysis: Secondary | ICD-10-CM | POA: Diagnosis not present

## 2021-11-24 DIAGNOSIS — D689 Coagulation defect, unspecified: Secondary | ICD-10-CM | POA: Diagnosis not present

## 2021-11-25 ENCOUNTER — Encounter: Payer: Self-pay | Admitting: Family Medicine

## 2021-11-25 ENCOUNTER — Ambulatory Visit (INDEPENDENT_AMBULATORY_CARE_PROVIDER_SITE_OTHER): Payer: Medicare Other | Admitting: Family Medicine

## 2021-11-25 VITALS — BP 151/90 | HR 68 | Temp 97.9°F

## 2021-11-25 DIAGNOSIS — H6121 Impacted cerumen, right ear: Secondary | ICD-10-CM

## 2021-11-25 DIAGNOSIS — E785 Hyperlipidemia, unspecified: Secondary | ICD-10-CM | POA: Diagnosis not present

## 2021-11-25 DIAGNOSIS — Z79899 Other long term (current) drug therapy: Secondary | ICD-10-CM

## 2021-11-25 DIAGNOSIS — K219 Gastro-esophageal reflux disease without esophagitis: Secondary | ICD-10-CM

## 2021-11-25 DIAGNOSIS — J449 Chronic obstructive pulmonary disease, unspecified: Secondary | ICD-10-CM | POA: Diagnosis not present

## 2021-11-25 DIAGNOSIS — E039 Hypothyroidism, unspecified: Secondary | ICD-10-CM | POA: Diagnosis not present

## 2021-11-25 DIAGNOSIS — M1990 Unspecified osteoarthritis, unspecified site: Secondary | ICD-10-CM

## 2021-11-25 DIAGNOSIS — I480 Paroxysmal atrial fibrillation: Secondary | ICD-10-CM

## 2021-11-25 DIAGNOSIS — F3342 Major depressive disorder, recurrent, in full remission: Secondary | ICD-10-CM

## 2021-11-25 DIAGNOSIS — F419 Anxiety disorder, unspecified: Secondary | ICD-10-CM

## 2021-11-25 DIAGNOSIS — I10 Essential (primary) hypertension: Secondary | ICD-10-CM

## 2021-11-25 DIAGNOSIS — N186 End stage renal disease: Secondary | ICD-10-CM

## 2021-11-25 DIAGNOSIS — G2581 Restless legs syndrome: Secondary | ICD-10-CM

## 2021-11-25 DIAGNOSIS — M546 Pain in thoracic spine: Secondary | ICD-10-CM

## 2021-11-25 DIAGNOSIS — E559 Vitamin D deficiency, unspecified: Secondary | ICD-10-CM

## 2021-11-25 DIAGNOSIS — G8929 Other chronic pain: Secondary | ICD-10-CM

## 2021-11-25 DIAGNOSIS — Z992 Dependence on renal dialysis: Secondary | ICD-10-CM

## 2021-11-25 MED ORDER — HYDROCODONE-ACETAMINOPHEN 10-325 MG PO TABS: 1.0000 | ORAL_TABLET | Freq: Four times a day (QID) | ORAL | 0 refills | Status: DC | PRN

## 2021-11-25 MED ORDER — DOCUSATE CALCIUM 240 MG PO CAPS: 240.0000 mg | ORAL_CAPSULE | Freq: Every day | ORAL | 1 refills | Status: DC

## 2021-11-25 MED ORDER — CITALOPRAM HYDROBROMIDE 40 MG PO TABS: 40.0000 mg | ORAL_TABLET | Freq: Every day | ORAL | 1 refills | Status: DC

## 2021-11-25 MED ORDER — PRAMIPEXOLE DIHYDROCHLORIDE 0.125 MG PO TABS: 0.1250 mg | ORAL_TABLET | Freq: Every day | ORAL | 1 refills | Status: DC

## 2021-11-25 MED ORDER — EZETIMIBE 10 MG PO TABS: 10.0000 mg | ORAL_TABLET | Freq: Every day | ORAL | 1 refills | Status: DC

## 2021-11-25 MED ORDER — LISINOPRIL 2.5 MG PO TABS: 2.5000 mg | ORAL_TABLET | Freq: Every day | ORAL | 2 refills | Status: DC

## 2021-11-25 MED ORDER — FAMOTIDINE 10 MG PO TABS: 10.0000 mg | ORAL_TABLET | Freq: Every day | ORAL | 1 refills | Status: DC

## 2021-11-25 MED ORDER — HYDRALAZINE HCL 10 MG PO TABS: 10.0000 mg | ORAL_TABLET | Freq: Three times a day (TID) | ORAL | 1 refills | Status: DC

## 2021-11-25 MED ORDER — PANTOPRAZOLE SODIUM 20 MG PO TBEC: 20.0000 mg | DELAYED_RELEASE_TABLET | Freq: Every day | ORAL | 1 refills | Status: DC

## 2021-11-25 NOTE — Progress Notes (Signed)
Assessment & Plan:  1. Anxiety Well controlled on current regimen.  - citalopram (CELEXA) 40 MG tablet; Take 1 tablet (40 mg total) by mouth daily.  Dispense: 90 tablet; Refill: 1  2. Recurrent major depressive disorder, in full remission ([place]) Well controlled on current regimen.  - citalopram (CELEXA) 40 MG tablet; Take 1 tablet (40 mg total) by mouth daily.  Dispense: 90 tablet; Refill: 1  3-5. Chronic midline thoracic back pain/Arthritis/Controlled substance agreement signed Improving, but not yet well controlled. Increased frequency from Q8H PRN to Q6H PRN. Controlled substance agreement in place. Blood drug screen as expected. PDMP reviewed with no concerning findings.  - HYDROcodone-acetaminophen (NORCO) 10-325 MG tablet; Take 1 tablet by mouth every 6 (six) hours as needed.  Dispense: 120 tablet; Refill: 0 - HYDROcodone-acetaminophen (NORCO) 10-325 MG tablet; Take 1 tablet by mouth every 6 (six) hours as needed.  Dispense: 120 tablet; Refill: 0 - HYDROcodone-acetaminophen (NORCO) 10-325 MG tablet; Take 1 tablet by mouth every 6 (six) hours as needed.  Dispense: 120 tablet; Refill: 0  6. Essential hypertension Uncontrolled. Adding Lisinopril daily. - hydrALAZINE (APRESOLINE) 10 MG tablet; Take 1 tablet (10 mg total) by mouth 3 (three) times daily.  Dispense: 270 tablet; Refill: 1 - lisinopril (ZESTRIL) 2.5 MG tablet; Take 1 tablet (2.5 mg total) by mouth daily.  Dispense: 30 tablet; Refill: 2  7. PAF (paroxysmal atrial fibrillation) (HCC) Rate controlled. Unable to tolerate anticoagulants.  8. Dyslipidemia Well controlled on current regimen.  - ezetimibe (ZETIA) 10 MG tablet; Take 1 tablet (10 mg total) by mouth daily.  Dispense: 90 tablet; Refill: 1  9. Gastroesophageal reflux disease without esophagitis Well controlled on current regimen.  - famotidine (PEPCID) 10 MG tablet; Take 1 tablet (10 mg total) by mouth daily.  Dispense: 90 tablet; Refill: 1 - pantoprazole (PROTONIX)  20 MG tablet; Take 1 tablet (20 mg total) by mouth daily.  Dispense: 90 tablet; Refill: 1  10. Restless leg syndrome Well controlled on current regimen.  - pramipexole (MIRAPEX) 0.125 MG tablet; Take 1 tablet (0.125 mg total) by mouth at bedtime.  Dispense: 90 tablet; Refill: 1  11. COPD without exacerbation ([place]) Well controlled on current regimen.   45. Acquired hypothyroidism Well controlled on current regimen.   13. CKD (chronic kidney disease) requiring chronic dialysis [facility]) Managed by nephrology.  14. ESRD (end stage renal disease) on dialysis [facility]) Managed by nephrology.  15. Vitamin D deficiency Managed by nephrology.  16. Impacted cerumen of right ear Encouraged to purchase Debrox wax removal kit over the counter. Drops (3-4) should be instilled twice daily x3 days, then the ear flushed with warm water on the 4th day.    Return in about 3 months (around 02/25/2022) for follow-up of chronic medication conditions with Je.  [name], MSN, APRN, FNP-C [facility]  Subjective:    Patient ID: [name], female    DOB: 1941/02/27, 81 y.o.   MRN: [id number]  Patient Care Team: [name], FNP as PCP - General (Family Medicine) Minus Breeding, MD as Consulting Physician (Cardiology) Tanda Rockers, MD as Consulting Physician (Pulmonary Disease) Jamal Maes, MD as Consulting Physician (Nephrology) Steffanie Rainwater, DPM as Consulting Physician (Podiatry) Rosita Fire, MD as Consulting Physician (Nephrology)   Chief Complaint:  Chief Complaint  Patient presents with   Medical Management of Chronic Issues   feels like something is in right ear    HPI: [name] is a 81 y.o. female  presenting on 11/25/2021 for Medical Management of Chronic Issues and feels like something is in right ear  Patient is accompanied by her daughter-in-law, who she is okay with being present.  Pain assessment: Cause of pain- arthritis, multiple  recent fractures Pain location- back, knees, hips Pain on scale of 1-10- 6/10 Frequency- daily What increases pain- activity What makes pain better- medication Effects on ADL- unable to complete ADLs without assistance Any change in general medical condition- no  Current opioids rx- Norco 10/325 PO every 8 hours as needed. This was previously increased, as patient was not getting very long relief with 7.5/325. She reports the relief is good but still doesn't last very long. # meds rx- 90 Effectiveness of current meds- effective  Adverse reactions from pain meds- none Morphine equivalent- 30 MME/day  Pill count performed-No Last drug screen - 05/27/2021 ( high risk q54m moderate risk q669mlow risk yearly ) Drug screen today- No [place] If yes were their any concerning findings? - No  Overdose risk: 100     09/04/2019    3:18 PM  Opioid Risk   Alcohol 0  Illegal Drugs 0  Rx Drugs 0  Alcohol 0  Illegal Drugs 0  Rx Drugs 0  Age between 16-45 years  0  History of Preadolescent Sexual Abuse 0  Psychological Disease 0  Depression 0  Opioid Risk Tool Scoring 0  Opioid Risk Interpretation Low Risk   Pain contract signed on: 05/27/2021   Hypertension: Hydralazine previously increased from BID to TID. Patient has been checking her blood pressure at home and at dialysis and reports blood pressure is still high - mostly [id number].  Dyslipidemia: taking Zetia daily.   Atrial Fibrillation: not on anticoagulation due to history of GI bleeding and high risk of falling.   Hypothyroidism: taking levothyroxine 125 mcg daily. Last TSH WNL in January 2023. Last dose adjustment in November 2022 when her dose was increased from 112 mcg to 125 mcg.  COPD: uses Symbicort twice daily and has Albuterol to use as needed.  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 Getting vitamin D during dialysis and discontinued oral vitamin D.   GERD:  controlled with famotidine and pantoprazole.  Restless leg syndrome: doing well with Mirapex.   Osteoporosis: Last DEXA completed in February 2023.  Anxiety/Depression: taking Celexa 40 mg, which is working well for the patient.     11/25/2021   10:50 AM 08/24/2021   10:15 AM 05/27/2021   10:32 AM  Depression screen PHQ 2/9  Decreased Interest _0 Down, Depressed, Hopeless 1 1 0  PHQ - 2 Score _1 Altered sleeping _2 Tired, decreased energy _3 Change in appetite 0 2 0  Feeling bad or failure about yourself  0 0 1  Trouble concentrating 0 0 0  Moving slowly or fidgety/restless 0 0 0  Suicidal thoughts 0 0 0  PHQ-9 Score _4 Difficult doing work/chores Not difficult at all Somewhat difficult Not difficult at all      11/25/2021   10:50 AM 08/24/2021   10:15 AM 05/27/2021   10:33 AM 04/15/2021    4:06 PM  GAD 7 : Generalized Anxiety Score  Nervous, Anxious, on Edge 0 0 0 1  Control/stop worrying 2 1 0 0  Worry too much - different things 2 2 0 0  Trouble relaxing _5 0  Restless  0 2 0 0  Easily annoyed or irritable _0 Afraid - awful might happen _1 0  Total GAD 7 Score _2 Anxiety Difficulty Somewhat difficult Somewhat difficult Not difficult at all Not difficult at all    New complaints: Patient reports it feels like something is in her right ear. It itches and feels full.    Social history:  Relevant past medical, surgical, family and social history reviewed and updated as indicated. Interim medical history since our last visit reviewed.  Allergies and medications reviewed and updated.  DATA REVIEWED: CHART IN EPIC  ROS: Negative unless specifically indicated above in HPI.    Current Outpatient Medications:    acetaminophen (TYLENOL) 325 MG tablet, Take 2 tablets (650 mg total) by mouth every 6 (six) hours as needed for mild pain, fever or headache., Disp: , Rfl:    albuterol (VENTOLIN HFA) 108 (90 Base) MCG/ACT inhaler, TAKE 2  PUFFS BY MOUTH EVERY 6 HOURS AS NEEDED FOR WHEEZE OR SHORTNESS OF BREATH, Disp: 8.5 each, Rfl: 1   AURYXIA 1 GM 210 MG(Fe) tablet, Take 420 mg by mouth 3 (three) times daily with meals., Disp: , Rfl:    cinacalcet (SENSIPAR) 30 MG tablet, Take 1 tablet (30 mg total) by mouth every Monday, Wednesday, and Friday., Disp: 60 tablet, Rfl: 0   doxercalciferol (HECTOROL) 4 MCG/2ML injection, Inject 0.5 mLs (1 mcg total) into the vein every Monday, Wednesday, and Friday with hemodialysis., Disp: 2 mL, Rfl: 0   ezetimibe (ZETIA) 10 MG tablet, TAKE 1 TABLET BY MOUTH EVERY DAY, Disp: 90 tablet, Rfl: 3   famotidine (PEPCID) 10 MG tablet, Take 1 tablet (10 mg total) by mouth daily., Disp: 90 tablet, Rfl: 1   hydrALAZINE (APRESOLINE) 10 MG tablet, TAKE 1 TABLET BY MOUTH THREE TIMES A DAY, Disp: 270 tablet, Rfl: 0   HYDROcodone-acetaminophen (NORCO) 10-325 MG tablet, Take 1 tablet by mouth every 8 (eight) hours., Disp: 90 tablet, Rfl: 0   HYDROcodone-acetaminophen (NORCO) 10-325 MG tablet, Take 1 tablet by mouth every 8 (eight) hours., Disp: 90 tablet, Rfl: 0   HYDROcodone-acetaminophen (NORCO) 10-325 MG tablet, Take 1 tablet by mouth every 8 (eight) hours., Disp: 90 tablet, Rfl: 0   levocetirizine (XYZAL) 5 MG tablet, TAKE 1 TABLET BY MOUTH EVERY DAY IN THE EVENING, Disp: 90 tablet, Rfl: 1   levothyroxine (SYNTHROID) 125 MCG tablet, Take 1 tablet (125 mcg total) by mouth daily., Disp: 90 tablet, Rfl: 1   methocarbamol (ROBAXIN) 500 MG tablet, Take 1 tablet (500 mg total) by mouth every 8 (eight) hours as needed for muscle spasms., Disp: 60 tablet, Rfl: 2   multivitamin (RENA-VIT) TABS tablet, Take 1 tablet by mouth daily., Disp: , Rfl:    nystatin cream (MYCOSTATIN), Apply 1 application topically 2 (two) times daily as needed for dry skin., Disp: 30 g, Rfl: 2   pantoprazole (PROTONIX) 20 MG tablet, TAKE 1 TABLET BY MOUTH EVERY DAY, Disp: 90 tablet, Rfl: 0   pramipexole (MIRAPEX) 0.125 MG tablet, Take 1 tablet  (0.125 mg total) by mouth at bedtime., Disp: 90 tablet, Rfl: 1   Romosozumab-aqqg (EVENITY) 105 MG/1.17ML SOSY injection, Inject 210 mg into the skin every 30 (thirty) days. (INJECT 2 SYRINGES), Disp: 2.34 mL, Rfl: 10   SYMBICORT 80-4.5 MCG/ACT inhaler, INHALE 2 PUFFS INTO THE LUNGS TWICE A DAY, Disp: 30.6 each, Rfl: 0   citalopram (CELEXA) 40 MG tablet, Take 1 tablet (40 mg total) by  mouth daily. (Patient not taking: Reported on 11/25/2021), Disp: 90 tablet, Rfl: 1   docusate calcium (SURFAK) 240 MG capsule, Take 1 capsule (240 mg total) by mouth daily. (Patient not taking: Reported on 11/25/2021), Disp: 90 capsule, Rfl: 1   Allergies  Allergen Reactions   Amoxicillin Rash    Has patient had a PCN reaction causing immediate rash, facial/tongue/throat swelling, SOB or lightheadedness with hypotension:YES Has patient had a PCN reaction causing severe rash involving mucus membranes or skin necrosis: Yes Has patient had a PCN reaction that required hospitalization No Has patient had a PCN reaction occurring within the last 10 years: Yes If all of the above answers are "NO", then may proceed with Cephalosporin use. Cephalosporins ok   Elemental Sulfur Hives   Penicillins Hives   Sulfur Hives   Peg 3350-Electrolytes Other (See Comments)   Sulfa Drugs Cross Reactors Hives and Itching   Miralax [Polyethylene Glycol] Itching   Mixed Grasses    Percocet [Oxycodone-Acetaminophen] Itching and Rash    Did not happen last time she took it   Sulfa Antibiotics Hives   Past Medical History:  Diagnosis Date   Acute respiratory failure ([place]) 05/2017   Anemia    Anxiety    Arthritis    COPD (chronic obstructive pulmonary disease) (HCC)    Depression    ESRD (end stage renal disease) (HCC)    dialysis MWF   GERD (gastroesophageal reflux disease)    Gout    History of blood transfusion    History of diabetes mellitus    "diet controlled"   HLD (hyperlipidemia)    HOH (hard of hearing)    left ear    Hypertension    hypotensive -since starting dialysis   Hypothyroidism    MRSA (methicillin resistant staph aureus) culture positive 06/01/2017   PAF (paroxysmal atrial fibrillation) ([place])    a. Echo 11/16:  Mild LVH, EF 55-60%, normal wall motion, MAC, mild MR, severe LAE (49 ml/m2), mild RVE, normal RVSF, mild RAE, mild TR, PASP 24 mmHg;  CHADS2-VASc: 4 >> Coumadin followed by PCP    Past Surgical History:  Procedure Laterality Date   AV FISTULA PLACEMENT  04/03/2012   Procedure: ARTERIOVENOUS (AV) FISTULA CREATION;  Surgeon: Elam Dutch, MD;  Location: [place];  Service: Vascular;  Laterality: Left;  creation left brachial cephalic fistula    BIOPSY  08/21/2018   Procedure: BIOPSY;  Surgeon: Thornton Park, MD;  Location: [place];  Service: Gastroenterology;;   CARPAL TUNNEL RELEASE Bilateral    COLONOSCOPY W/ POLYPECTOMY     COLONOSCOPY WITH PROPOFOL N/A 08/21/2018   Procedure: COLONOSCOPY WITH PROPOFOL;  Surgeon: Thornton Park, MD;  Location: [facility] ENDOSCOPY;  Service: Gastroenterology;  Laterality: N/A;   DILATION AND CURETTAGE OF UTERUS     ESOPHAGOGASTRODUODENOSCOPY (EGD) WITH PROPOFOL N/A 08/21/2018   Procedure: ESOPHAGOGASTRODUODENOSCOPY (EGD) WITH PROPOFOL;  Surgeon: Thornton Park, MD;  Location: [place];  Service: Gastroenterology;  Laterality: N/A;   EYE SURGERY Bilateral    bilateral cataract removal   HEMATOMA EVACUATION Left 05/14/2018   Procedure: Incision and Drainage of Left Arm Hematoma;  Surgeon: Elam Dutch, MD;  Location: [facility] OR;  Service: Vascular;  Laterality: Left;   Hemodialysis  catheter Right    IR FLUORO GUIDE CV LINE RIGHT  05/26/2017   IR FLUORO GUIDE CV LINE RIGHT  04/10/2021   IR REMOVAL TUN CV CATH W/O FL  05/24/2017   IR REMOVAL TUN CV CATH W/O FL  04/10/2021  IR US GUIDE VASC ACCESS RIGHT  05/26/2017   IR US GUIDE VASC ACCESS RIGHT  04/10/2021   LIGATION OF ARTERIOVENOUS  FISTULA Left 02/04/2015   Procedure: LIGATION OF  BRACHIOCEPHALIC ARTERIOVENOUS  FISTULA;  Surgeon: Conrad South Holland, MD;  Location: [place];  Service: Vascular;  Laterality: Left;   MULTIPLE TOOTH EXTRACTIONS     PARTIAL HIP ARTHROPLASTY     POLYPECTOMY  08/21/2018   Procedure: POLYPECTOMY;  Surgeon: Thornton Park, MD;  Location: [facility] ENDOSCOPY;  Service: Gastroenterology;;   PORTACATH PLACEMENT     REVERSE SHOULDER ARTHROPLASTY Left 01/22/2016   Procedure: LEFT REVERSE SHOULDER ARTHROPLASTY;  Surgeon: Netta Cedars, MD;  Location: [place];  Service: Orthopedics;  Laterality: Left;   SHOULDER ARTHROSCOPY Bilateral    SPLIT NIGHT STUDY  07/26/2015   STERIOD INJECTION Right 01/22/2016   Procedure: RIGHT RING FINGER STEROID INJECTION;  Surgeon: Netta Cedars, MD;  Location: [place];  Service: Orthopedics;  Laterality: Right;   TEE WITHOUT CARDIOVERSION N/A 05/26/2017   Procedure: TRANSESOPHAGEAL ECHOCARDIOGRAM (TEE);  Surgeon: Lelon Perla, MD;  Location: [place];  Service: Cardiovascular;  Laterality: N/A;   THROMBECTOMY BRACHIAL ARTERY Left 02/06/2015   Procedure: EVACUATION OF LEFT ARM HEMATOMA;  Surgeon: Angelia Mould, MD;  Location: [place];  Service: Vascular;  Laterality: Left;   TOTAL KNEE ARTHROPLASTY Bilateral    TUBAL LIGATION      Social History   Socioeconomic History   Marital status: Widowed    Spouse name: Not on file   Number of children: 6   Years of education: 10   Highest education level: 10th grade  Occupational History   Occupation: RETIRED  Tobacco Use   Smoking status: Former    Packs/day: 0.25    Years: 2.00    Total pack years: 0.50    Types: Cigarettes    Quit date: 04/04/1998    Years since quitting: 23.6   Smokeless tobacco: Never  Vaping Use   Vaping Use: Never used  Substance and Sexual Activity   Alcohol use: No    Alcohol/week: 0.0 standard drinks of alcohol   Drug use: No   Sexual activity: Not Currently    Birth control/protection: None  Other Topics Concern   Not on file  Social  History Narrative   ** Merged History Encounter ** Widowed   6 children   Lives with son and daughter in [place]   Homemaker   ESRD - dialysis 3x per week   Cognitive impairment   Social Determinants of Health   Financial Resource Strain: Low Risk  (12/01/2020)   Overall Financial Resource Strain (CARDIA)    Difficulty of Paying Living Expenses: Not hard at all  Food Insecurity: No Food Insecurity (12/01/2020)   Hunger Vital Sign    Worried About Running Out of Food in the Last Year: Never true    [place] in the Last Year: Never true  Transportation Needs: No Transportation Needs (12/01/2020)   PRAPARE - Hydrologist (Medical): No    Lack of Transportation (Non-Medical): No  Physical Activity: Inactive (12/01/2020)   Exercise Vital Sign    Days of Exercise per Week: 0 days    Minutes of Exercise per Session: 0 min  Stress: No Stress Concern Present (12/01/2020)   [employer]    Feeling of Stress : Only a little  Social Connections: Moderately Isolated (12/01/2020)   Social Connection and  Isolation Panel [NHANES]    Frequency of Communication with Friends and Family: More than three times a week    Frequency of Social Gatherings with Friends and Family: More than three times a week    Attends Religious Services: 1 to 4 times per year    Active Member of [employer] or Organizations: No    Attends Archivist Meetings: Never    Marital Status: Widowed  Intimate Partner Violence: Not At Risk (12/01/2020)   Humiliation, Afraid, Rape, and Kick questionnaire    Fear of Current or Ex-Partner: No    Emotionally Abused: No    Physically Abused: No    Sexually Abused: No        Objective:    BP (!) 151/90   Pulse 68   Temp 97.9 F (36.6 C) (Temporal)   SpO2 100%   Wt Readings from Last 3 Encounters:  04/28/21 110 lb (49.9 kg)  04/15/21 113 lb 8 oz (51.5 kg)  04/10/21 114 lb 3.2  oz (51.8 kg)    Physical Exam Vitals reviewed.  Constitutional:      General: She is not in acute distress.    Appearance: Normal appearance. She is not ill-appearing, toxic-appearing or diaphoretic.  HENT:     Head: Normocephalic and atraumatic.     Right Ear: External ear normal. There is impacted cerumen.     Left Ear: Tympanic membrane, ear canal and external ear normal. There is no impacted cerumen.  Eyes:     General: No scleral icterus.       Right eye: No discharge.        Left eye: No discharge.     Conjunctiva/sclera: Conjunctivae normal.  Cardiovascular:     Rate and Rhythm: Normal rate. Rhythm irregular.     Heart sounds: Normal heart sounds. No murmur heard.    No friction rub. No gallop.  Pulmonary:     Effort: Pulmonary effort is normal. No respiratory distress.     Breath sounds: Normal breath sounds. No stridor. No wheezing, rhonchi or rales.  Musculoskeletal:        General: Normal range of motion.     Cervical back: Normal range of motion.  Skin:    General: Skin is warm and dry.     Capillary Refill: Capillary refill takes less than 2 seconds.  Neurological:     General: No focal deficit present.     Mental Status: She is alert and oriented to person, place, and time. Mental status is at baseline.     Gait: Gait abnormal (riding in [facility]).  Psychiatric:        Mood and Affect: Mood normal.        Behavior: Behavior normal.        Thought Content: Thought content normal.        Judgment: Judgment normal.     Lab Results  Component Value Date   TSH 2.703 04/05/2021   Lab Results  Component Value Date   WBC 9.4 05/27/2021   HGB 11.5 05/27/2021   HCT 35.4 05/27/2021   MCV 92 05/27/2021   PLT 317 05/27/2021   Lab Results  Component Value Date   NA 145 (H) 05/27/2021   K 4.2 05/27/2021   CO2 28 05/27/2021   GLUCOSE 73 05/27/2021   BUN 26 05/27/2021   CREATININE 4.16 (HH) 05/27/2021   BILITOT 0.4 05/27/2021   ALKPHOS 101 05/27/2021   AST 39  05/27/2021   ALT 33 (H) 05/27/2021  PROT 6.4 05/27/2021   ALBUMIN 3.7 05/27/2021   CALCIUM 8.6 (L) 05/27/2021   ANIONGAP 11 04/10/2021   EGFR 10 (L) 05/27/2021   Lab Results  Component Value Date   CHOL 144 02/23/2021   Lab Results  Component Value Date   HDL 68 02/23/2021   Lab Results  Component Value Date   LDLCALC 62 02/23/2021   Lab Results  Component Value Date   TRIG 41 04/05/2021   Lab Results  Component Value Date   CHOLHDL 2.1 02/23/2021   Lab Results  Component Value Date   HGBA1C 4.1 (L) 05/27/2021

## 2021-11-25 NOTE — Patient Instructions (Addendum)
Tegaderm to cover dialysis port during a shower.  Purchase Debrox wax removal kit over the counter. Drops (3-4) should be instilled twice daily x3 days, then the ear flushed with warm water on the 4th day.

## 2021-11-26 DIAGNOSIS — Z992 Dependence on renal dialysis: Secondary | ICD-10-CM | POA: Diagnosis not present

## 2021-11-26 DIAGNOSIS — N186 End stage renal disease: Secondary | ICD-10-CM | POA: Diagnosis not present

## 2021-11-26 DIAGNOSIS — D689 Coagulation defect, unspecified: Secondary | ICD-10-CM | POA: Diagnosis not present

## 2021-11-26 DIAGNOSIS — Z4901 Encounter for fitting and adjustment of extracorporeal dialysis catheter: Secondary | ICD-10-CM | POA: Diagnosis not present

## 2021-11-26 DIAGNOSIS — N2581 Secondary hyperparathyroidism of renal origin: Secondary | ICD-10-CM | POA: Diagnosis not present

## 2021-11-26 DIAGNOSIS — R519 Headache, unspecified: Secondary | ICD-10-CM | POA: Diagnosis not present

## 2021-11-29 DIAGNOSIS — Z992 Dependence on renal dialysis: Secondary | ICD-10-CM | POA: Diagnosis not present

## 2021-11-29 DIAGNOSIS — D689 Coagulation defect, unspecified: Secondary | ICD-10-CM | POA: Diagnosis not present

## 2021-11-29 DIAGNOSIS — N2581 Secondary hyperparathyroidism of renal origin: Secondary | ICD-10-CM | POA: Diagnosis not present

## 2021-11-29 DIAGNOSIS — Z4901 Encounter for fitting and adjustment of extracorporeal dialysis catheter: Secondary | ICD-10-CM | POA: Diagnosis not present

## 2021-11-29 DIAGNOSIS — R519 Headache, unspecified: Secondary | ICD-10-CM | POA: Diagnosis not present

## 2021-11-29 DIAGNOSIS — N186 End stage renal disease: Secondary | ICD-10-CM | POA: Diagnosis not present

## 2021-12-01 DIAGNOSIS — N2581 Secondary hyperparathyroidism of renal origin: Secondary | ICD-10-CM | POA: Diagnosis not present

## 2021-12-01 DIAGNOSIS — Z992 Dependence on renal dialysis: Secondary | ICD-10-CM | POA: Diagnosis not present

## 2021-12-01 DIAGNOSIS — D689 Coagulation defect, unspecified: Secondary | ICD-10-CM | POA: Diagnosis not present

## 2021-12-01 DIAGNOSIS — Z4901 Encounter for fitting and adjustment of extracorporeal dialysis catheter: Secondary | ICD-10-CM | POA: Diagnosis not present

## 2021-12-01 DIAGNOSIS — N186 End stage renal disease: Secondary | ICD-10-CM | POA: Diagnosis not present

## 2021-12-01 DIAGNOSIS — R519 Headache, unspecified: Secondary | ICD-10-CM | POA: Diagnosis not present

## 2021-12-02 ENCOUNTER — Ambulatory Visit (INDEPENDENT_AMBULATORY_CARE_PROVIDER_SITE_OTHER): Payer: Medicare Other

## 2021-12-02 DIAGNOSIS — Z992 Dependence on renal dialysis: Secondary | ICD-10-CM | POA: Diagnosis not present

## 2021-12-02 DIAGNOSIS — Z0000 Encounter for general adult medical examination without abnormal findings: Secondary | ICD-10-CM

## 2021-12-02 DIAGNOSIS — N186 End stage renal disease: Secondary | ICD-10-CM | POA: Diagnosis not present

## 2021-12-02 DIAGNOSIS — E1122 Type 2 diabetes mellitus with diabetic chronic kidney disease: Secondary | ICD-10-CM | POA: Diagnosis not present

## 2021-12-02 NOTE — Progress Notes (Signed)
MEDICARE ANNUAL WELLNESS VISIT  12/02/2021  Telephone Visit Disclaimer This Medicare AWV was conducted by telephone due to national recommendations for restrictions regarding the COVID-19 Pandemic (e.g. social distancing).  I verified, using two identifiers, that I am speaking with [name] or their authorized healthcare agent. I discussed the limitations, risks, security, and privacy concerns of performing an evaluation and management service by telephone and the potential availability of an in-person appointment in the future. The patient expressed understanding and agreed to proceed.  Location of Patient: Home Location of Provider (nurse):  [facility]  Subjective:    [name] is a 81 y.o. female patient of Ivy Lynn, NP who had a Medicare Annual Wellness Visit today via telephone. [name] is wheelchair bound and lives with her son and daughter in [place]. Her daughter in law is her full time caregiver. She is able to transfer but needs assistance and is unable to care for herself. She enjoys watching TV  Patient Care Team: Ivy Lynn, NP as PCP - General (Nurse Practitioner) Minus Breeding, MD as Consulting Physician (Cardiology) Tanda Rockers, MD as Consulting Physician (Pulmonary Disease) Jamal Maes, MD as Consulting Physician (Nephrology) Steffanie Rainwater, DPM as Consulting Physician (Podiatry) Rosita Fire, MD as Consulting Physician (Nephrology)     12/02/2021   10:36 AM 04/05/2021    8:55 AM 12/01/2020    1:51 PM 06/02/2020    8:12 PM 03/19/2020    1:56 PM 01/16/2020   10:57 AM 08/06/2019    2:18 PM  Advanced Directives  Does Patient Have a Medical Advance Directive? No No Yes Yes Yes Yes No  Type of Advance Directive   [place] in Chart?   Yes - validated most recent copy scanned in chart (See row information)      Would patient like information on creating a  medical advance directive? No - Patient declined No - Patient declined     No - Patient declined    Hospital Utilization Over the Past 12 Months: # of hospitalizations or ER visits: 0 # of surgeries: 0  Review of Systems    Patient reports that her overall health is unchanged compared to last year.  History obtained from daughter in law  Patient Reported Readings (BP, Pulse, CBG, Weight, etc) none  Pain Assessment Pain : 0-10 Pain Score: 3  Pain Type: Chronic pain Pain Location: Hip Pain Orientation: Left Pain Descriptors / Indicators: Constant Pain Onset: More than a month ago Pain Frequency: Intermittent     Current Medications & Allergies (verified) Allergies as of 12/02/2021       Reactions   Amoxicillin Rash   Has patient had a PCN reaction causing immediate rash, facial/tongue/throat swelling, SOB or lightheadedness with hypotension:YES Has patient had a PCN reaction causing severe rash involving mucus membranes or skin necrosis: Yes Has patient had a PCN reaction that required hospitalization No Has patient had a PCN reaction occurring within the last 10 years: Yes If all of the above answers are "NO", then may proceed with Cephalosporin use. Cephalosporins ok   Elemental Sulfur Hives   Penicillins Hives   Sulfur Hives   Peg 3350-electrolytes Other (See Comments)   Sulfa Drugs Cross Reactors Hives, Itching   Miralax [polyethylene Glycol] Itching   Mixed Grasses    Percocet [oxycodone-acetaminophen] Itching, Rash   Did not happen last time she took  it   Sulfa Antibiotics Hives        Medication List        Accurate as of December 02, 2021 10:57 AM. If you have any questions, ask your nurse or doctor.          acetaminophen 325 MG tablet Commonly known as: TYLENOL Take 2 tablets (650 mg total) by mouth every 6 (six) hours as needed for mild pain, fever or headache.   albuterol 108 (90 Base) MCG/ACT inhaler Commonly known as: VENTOLIN HFA TAKE 2  PUFFS BY MOUTH EVERY 6 HOURS AS NEEDED FOR WHEEZE OR SHORTNESS OF BREATH   Auryxia 1 GM 210 MG(Fe) tablet Generic drug: ferric citrate Take 420 mg by mouth 3 (three) times daily with meals.   cinacalcet 30 MG tablet Commonly known as: SENSIPAR Take 1 tablet (30 mg total) by mouth every Monday, Wednesday, and Friday.   citalopram 40 MG tablet Commonly known as: CELEXA Take 1 tablet (40 mg total) by mouth daily.   docusate calcium 240 MG capsule Commonly known as: SURFAK Take 1 capsule (240 mg total) by mouth daily.   doxercalciferol 4 MCG/2ML injection Commonly known as: HECTOROL Inject 0.5 mLs (1 mcg total) into the vein every Monday, Wednesday, and Friday with hemodialysis.   Evenity 105 MG/1.17ML Sosy injection Generic drug: Romosozumab-aqqg Inject 210 mg into the skin every 30 (thirty) days. (INJECT 2 SYRINGES)   ezetimibe 10 MG tablet Commonly known as: ZETIA Take 1 tablet (10 mg total) by mouth daily.   famotidine 10 MG tablet Commonly known as: PEPCID Take 1 tablet (10 mg total) by mouth daily.   hydrALAZINE 10 MG tablet Commonly known as: APRESOLINE Take 1 tablet (10 mg total) by mouth 3 (three) times daily.   HYDROcodone-acetaminophen 10-325 MG tablet Commonly known as: NORCO Take 1 tablet by mouth every 6 (six) hours as needed.   HYDROcodone-acetaminophen 10-325 MG tablet Commonly known as: NORCO Take 1 tablet by mouth every 6 (six) hours as needed. Start taking on: December 25, 2021   HYDROcodone-acetaminophen 10-325 MG tablet Commonly known as: NORCO Take 1 tablet by mouth every 6 (six) hours as needed. Start taking on: January 24, 2022   levocetirizine 5 MG tablet Commonly known as: XYZAL TAKE 1 TABLET BY MOUTH EVERY DAY IN THE EVENING   levothyroxine 125 MCG tablet Commonly known as: SYNTHROID Take 1 tablet (125 mcg total) by mouth daily.   lisinopril 2.5 MG tablet Commonly known as: Zestril Take 1 tablet (2.5 mg total) by mouth daily.    methocarbamol 500 MG tablet Commonly known as: ROBAXIN Take 1 tablet (500 mg total) by mouth every 8 (eight) hours as needed for muscle spasms.   multivitamin Tabs tablet Take 1 tablet by mouth daily.   nystatin cream Commonly known as: MYCOSTATIN Apply 1 application topically 2 (two) times daily as needed for dry skin.   pantoprazole 20 MG tablet Commonly known as: PROTONIX Take 1 tablet (20 mg total) by mouth daily.   pramipexole 0.125 MG tablet Commonly known as: MIRAPEX Take 1 tablet (0.125 mg total) by mouth at bedtime.   Symbicort 80-4.5 MCG/ACT inhaler Generic drug: budesonide-formoterol INHALE 2 PUFFS INTO THE LUNGS TWICE A DAY        History (reviewed): Past Medical History:  Diagnosis Date   Acute respiratory failure ([place]) 05/2017   Anemia    Anxiety    Arthritis    COPD (chronic obstructive pulmonary disease) ([place])    Depression  ESRD (end stage renal disease) ([place])    dialysis MWF   GERD (gastroesophageal reflux disease)    Gout    History of blood transfusion    History of diabetes mellitus    "diet controlled"   HLD (hyperlipidemia)    HOH (hard of hearing)    left ear   Hypertension    hypotensive -since starting dialysis   Hypothyroidism    MRSA (methicillin resistant staph aureus) culture positive 06/01/2017   PAF (paroxysmal atrial fibrillation) ([place])    a. Echo 11/16:  Mild LVH, EF 55-60%, normal wall motion, MAC, mild MR, severe LAE (49 ml/m2), mild RVE, normal RVSF, mild RAE, mild TR, PASP 24 mmHg;  CHADS2-VASc: 4 >> Coumadin followed by PCP   Past Surgical History:  Procedure Laterality Date   AV FISTULA PLACEMENT  04/03/2012   Procedure: ARTERIOVENOUS (AV) FISTULA CREATION;  Surgeon: Elam Dutch, MD;  Location: [place];  Service: Vascular;  Laterality: Left;  creation left brachial cephalic fistula    BIOPSY  08/21/2018   Procedure: BIOPSY;  Surgeon: Thornton Park, MD;  Location: [place];  Service: Gastroenterology;;    CARPAL TUNNEL RELEASE Bilateral    COLONOSCOPY W/ POLYPECTOMY     COLONOSCOPY WITH PROPOFOL N/A 08/21/2018   Procedure: COLONOSCOPY WITH PROPOFOL;  Surgeon: Thornton Park, MD;  Location: [facility] ENDOSCOPY;  Service: Gastroenterology;  Laterality: N/A;   DILATION AND CURETTAGE OF UTERUS     ESOPHAGOGASTRODUODENOSCOPY (EGD) WITH PROPOFOL N/A 08/21/2018   Procedure: ESOPHAGOGASTRODUODENOSCOPY (EGD) WITH PROPOFOL;  Surgeon: Thornton Park, MD;  Location: [place];  Service: Gastroenterology;  Laterality: N/A;   EYE SURGERY Bilateral    bilateral cataract removal   HEMATOMA EVACUATION Left 05/14/2018   Procedure: Incision and Drainage of Left Arm Hematoma;  Surgeon: Elam Dutch, MD;  Location: [place];  Service: Vascular;  Laterality: Left;   Hemodialysis  catheter Right    IR FLUORO GUIDE CV LINE RIGHT  05/26/2017   IR FLUORO GUIDE CV LINE RIGHT  04/10/2021   IR REMOVAL TUN CV CATH W/O FL  05/24/2017   IR REMOVAL TUN CV CATH W/O FL  04/10/2021   IR US GUIDE VASC ACCESS RIGHT  05/26/2017   IR US GUIDE VASC ACCESS RIGHT  04/10/2021   LIGATION OF ARTERIOVENOUS  FISTULA Left 02/04/2015   Procedure: LIGATION OF BRACHIOCEPHALIC ARTERIOVENOUS  FISTULA;  Surgeon: Conrad Surry, MD;  Location: [place];  Service: Vascular;  Laterality: Left;   MULTIPLE TOOTH EXTRACTIONS     PARTIAL HIP ARTHROPLASTY     POLYPECTOMY  08/21/2018   Procedure: POLYPECTOMY;  Surgeon: Thornton Park, MD;  Location: [place];  Service: Gastroenterology;;   PORTACATH PLACEMENT     REVERSE SHOULDER ARTHROPLASTY Left 01/22/2016   Procedure: LEFT REVERSE SHOULDER ARTHROPLASTY;  Surgeon: Netta Cedars, MD;  Location: [place];  Service: Orthopedics;  Laterality: Left;   SHOULDER ARTHROSCOPY Bilateral    SPLIT NIGHT STUDY  07/26/2015   STERIOD INJECTION Right 01/22/2016   Procedure: RIGHT RING FINGER STEROID INJECTION;  Surgeon: Netta Cedars, MD;  Location: [place];  Service: Orthopedics;  Laterality: Right;   TEE WITHOUT CARDIOVERSION  N/A 05/26/2017   Procedure: TRANSESOPHAGEAL ECHOCARDIOGRAM (TEE);  Surgeon: Lelon Perla, MD;  Location: [place];  Service: Cardiovascular;  Laterality: N/A;   THROMBECTOMY BRACHIAL ARTERY Left 02/06/2015   Procedure: EVACUATION OF LEFT ARM HEMATOMA;  Surgeon: Angelia Mould, MD;  Location: [place];  Service: Vascular;  Laterality: Left;   TOTAL KNEE ARTHROPLASTY  Bilateral    TUBAL LIGATION     Family History  Problem Relation Age of Onset   Arthritis Mother    Diabetes Father        before age 28   Heart disease Father    Diabetes Sister    Dementia Sister    Cancer Sister        Unknown   Other Sister        Died in car accident   Cancer Brother    Stroke Brother    Hyperlipidemia Daughter    Hypertension Daughter    Diabetes Daughter    Diabetes Daughter    Hypertension Daughter    Hyperlipidemia Daughter    Hypertension Son    Hyperlipidemia Son    Hepatitis C Son    Social History   Socioeconomic History   Marital status: Widowed    Spouse name: Not on file   Number of children: 6   Years of education: 10   Highest education level: 10th grade  Occupational History   Occupation: RETIRED  Tobacco Use   Smoking status: Former    Packs/day: 0.25    Years: 2.00    Total pack years: 0.50    Types: Cigarettes    Quit date: 04/04/1998    Years since quitting: 23.6   Smokeless tobacco: Never  Vaping Use   Vaping Use: Never used  Substance and Sexual Activity   Alcohol use: No    Alcohol/week: 0.0 standard drinks of alcohol   Drug use: No   Sexual activity: Not Currently    Birth control/protection: None  Other Topics Concern   Not on file  Social History Narrative   ** Merged History Encounter ** Widowed   6 children   Lives with son and daughter in [place]   Homemaker   ESRD - dialysis 3x per week   Cognitive impairment   Social Determinants of Health   Financial Resource Strain: Low Risk  (12/01/2020)   Overall Financial Resource Strain  (CARDIA)    Difficulty of Paying Living Expenses: Not hard at all  Food Insecurity: No Food Insecurity (12/01/2020)   Hunger Vital Sign    Worried About Running Out of Food in the Last Year: Never true    [place] in the Last Year: Never true  Transportation Needs: No Transportation Needs (12/01/2020)   PRAPARE - Hydrologist (Medical): No    Lack of Transportation (Non-Medical): No  Physical Activity: Inactive (12/01/2020)   Exercise Vital Sign    Days of Exercise per Week: 0 days    Minutes of Exercise per Session: 0 min  Stress: No Stress Concern Present (12/01/2020)   [place]    Feeling of Stress : Only a little  Social Connections: Moderately Isolated (12/01/2020)   Social Connection and Isolation Panel [NHANES]    Frequency of Communication with Friends and Family: More than three times a week    Frequency of Social Gatherings with Friends and Family: More than three times a week    Attends Religious Services: 1 to 4 times per year    Active Member of [employer] or Organizations: No    Attends Archivist Meetings: Never    Marital Status: Widowed    Activities of Daily Living    12/02/2021   10:37 AM  In your present state of health, do you have any difficulty performing the following  activities:  Hearing? 0  Vision? 0  Difficulty concentrating or making decisions? 0  Walking or climbing stairs? 1  Dressing or bathing? 1  Doing errands, shopping? 1  Preparing Food and eating ? Y  Using the Toilet? Y  In the past six months, have you accidently leaked urine? Y  Do you have problems with loss of bowel control? N  Managing your Medications? Y  Managing your Finances? Y  Housekeeping or managing your Housekeeping? Y    Patient Education/ Literacy How often do you need to have someone help you when you read instructions, pamphlets, or other written materials from  your doctor or pharmacy?: 2 - Rarely What is the last grade level you completed in school?: 10th grade  Exercise Current Exercise Habits: The patient does not participate in regular exercise at present, Exercise limited by: cardiac condition(s);orthopedic condition(s)  Diet Patient reports consuming 3 meals a day and 2 snack(s) a day Patient reports that her primary diet is: Regular Patient reports that she does have regular access to food.   Depression Screen    11/25/2021   10:50 AM 08/24/2021   10:15 AM 05/27/2021   10:32 AM 04/15/2021    4:05 PM 02/23/2021   11:24 AM 12/01/2020   11:06 AM 11/19/2020   10:27 AM  PHQ 2/9 Scores  PHQ - 2 Score _0 PHQ- 9 Score _1 Fall Risk    12/02/2021   10:37 AM 11/25/2021   10:50 AM 08/24/2021   10:15 AM 05/27/2021   10:32 AM 04/15/2021    4:05 PM  Fall Risk   Falls in the past year? 0 0 0 1 0  Number falls in past yr:    1   Injury with Fall?    1   Follow up    Falls prevention discussed      Objective:  [name] seemed alert and oriented and she participated appropriately during our telephone visit.  Blood Pressure Weight BMI  BP Readings from Last 3 Encounters:  11/25/21 (!) 151/90  08/24/21 (!) 143/80  05/27/21 132/82   Wt Readings from Last 3 Encounters:  04/28/21 110 lb (49.9 kg)  04/15/21 113 lb 8 oz (51.5 kg)  04/10/21 114 lb 3.2 oz (51.8 kg)   BMI Readings from Last 1 Encounters:  04/28/21 18.30 kg/m    *Unable to obtain current vital signs, weight, and BMI due to telephone visit type  Hearing/Vision  Mykia did not seem to have difficulty with hearing/understanding during the telephone conversation Reports that she has had a formal eye exam by an eye care professional within the past year Reports that she has not had a formal hearing evaluation within the past year *Unable to fully assess hearing and vision during telephone visit type  Cognitive Function:    12/02/2021   10:38 AM  08/06/2019    2:23 PM  6CIT Screen  What Year? 0 points 0 points  What month? 0 points 3 points  What time? 0 points 0 points  Count back from 20 0 points 0 points  Months in reverse 0 points 0 points  Repeat phrase 2 points 4 points  Total Score 2 points 7 points   (Normal:0-7, Significant for Dysfunction: >8)  Normal Cognitive Function Screening: Yes   Immunization & Health Maintenance Record Immunization History  Administered Date(s) Administered   Fluad Quad(high Dose 65+) 03/10/2020  Hepatitis B, adult 12/27/2014, 02/25/2015, 03/27/2015, 06/26/2015, 10/02/2015, 11/02/2015, 12/02/2015, 04/01/2016   Influenza Split 02/03/2015   Influenza Whole 01/05/2015, 01/03/2016   Influenza, High Dose Seasonal PF 01/05/2015, 12/26/2017, 12/21/2018, 03/10/2020, 05/27/2020   Influenza, Seasonal, Injecte, Preservative Fre 12/26/2016   Influenza,inj,Quad PF,6+ Mos 03/10/2020   Influenza,inj,quad, With Preservative 01/26/2018   Influenza,trivalent, recombinat, inj, PF 02/03/2015   Influenza-Unspecified 03/25/2014, 12/26/2016, 01/26/2018, 12/21/2018, 03/10/2020, 01/13/2021   Moderna Sars-Covid-2 Vaccination 08/13/2019, 09/10/2019, 03/26/2020   Pneumococcal Conjugate-13 03/10/2015, 03/25/2015, 04/05/2015, 04/27/2016   Pneumococcal Polysaccharide-23 03/25/2014   Tdap 11/09/2018, 02/11/2020   Zoster Recombinat (Shingrix) 05/27/2021, 08/24/2021    Health Maintenance  Topic Date Due   COVID-19 Vaccine (4 - Moderna risk series) 12/11/2021 (Originally 05/21/2020)   INFLUENZA VACCINE  07/03/2022 (Originally 11/02/2021)   DEXA SCAN  05/29/2023   TETANUS/TDAP  02/10/2030   Pneumonia Vaccine 72+ Years old  Completed   Zoster Vaccines- Shingrix  Completed   HPV VACCINES  Aged Out       Assessment  This is a routine wellness examination for [employer].  Health Maintenance: Due or Overdue There are no preventive care reminders to display for this patient.  [name] does not need a referral  for Community Assistance: Care Management:   no Social Work:    no Prescription Assistance:  no Nutrition/Diabetes Education:  no   Plan:  Personalized Goals  Goals Addressed   None    Personalized Health Maintenance & Screening Recommendations  Patient is up to date on health maintenance. Needs up coming seasonal flu vaccine when available  Lung Cancer Screening Recommended: no (Low Dose CT Chest recommended if Age 39-80 years, 30 pack-year currently smoking OR have quit w/in past 15 years) Hepatitis C Screening recommended: no HIV Screening recommended: no  Advanced Directives: Written information was not prepared per patient's request.  Referrals & Orders No orders of the defined types were placed in this encounter.   Follow-up Plan Follow-up with Ivy Lynn, NP as planned Schedule for seasonal flu shot    I have personally reviewed and noted the following in the patient's chart:   Medical and social history Use of alcohol, tobacco or illicit drugs  Current medications and supplements Functional ability and status Nutritional status Physical activity Advanced directives List of other physicians Hospitalizations, surgeries, and ER visits in previous 12 months Vitals Screenings to include cognitive, depression, and falls Referrals and appointments  In addition, I have reviewed and discussed with [name] certain preventive protocols, quality metrics, and best practice recommendations. A written personalized care plan for preventive services as well as general preventive health recommendations is available and can be mailed to the patient at her request.     Rolena Infante LPN [id number]

## 2021-12-03 DIAGNOSIS — N186 End stage renal disease: Secondary | ICD-10-CM | POA: Diagnosis not present

## 2021-12-03 DIAGNOSIS — D689 Coagulation defect, unspecified: Secondary | ICD-10-CM | POA: Diagnosis not present

## 2021-12-03 DIAGNOSIS — Z992 Dependence on renal dialysis: Secondary | ICD-10-CM | POA: Diagnosis not present

## 2021-12-03 DIAGNOSIS — N2581 Secondary hyperparathyroidism of renal origin: Secondary | ICD-10-CM | POA: Diagnosis not present

## 2021-12-03 DIAGNOSIS — R519 Headache, unspecified: Secondary | ICD-10-CM | POA: Diagnosis not present

## 2021-12-03 DIAGNOSIS — Z4901 Encounter for fitting and adjustment of extracorporeal dialysis catheter: Secondary | ICD-10-CM | POA: Diagnosis not present

## 2021-12-06 DIAGNOSIS — N186 End stage renal disease: Secondary | ICD-10-CM | POA: Diagnosis not present

## 2021-12-06 DIAGNOSIS — D689 Coagulation defect, unspecified: Secondary | ICD-10-CM | POA: Diagnosis not present

## 2021-12-06 DIAGNOSIS — R519 Headache, unspecified: Secondary | ICD-10-CM | POA: Diagnosis not present

## 2021-12-06 DIAGNOSIS — N2581 Secondary hyperparathyroidism of renal origin: Secondary | ICD-10-CM | POA: Diagnosis not present

## 2021-12-06 DIAGNOSIS — Z992 Dependence on renal dialysis: Secondary | ICD-10-CM | POA: Diagnosis not present

## 2021-12-06 DIAGNOSIS — Z4901 Encounter for fitting and adjustment of extracorporeal dialysis catheter: Secondary | ICD-10-CM | POA: Diagnosis not present

## 2021-12-07 ENCOUNTER — Telehealth: Payer: Self-pay | Admitting: Nurse Practitioner

## 2021-12-07 NOTE — Telephone Encounter (Signed)
Aware and verbalizes understanding.  

## 2021-12-07 NOTE — Telephone Encounter (Signed)
Have patient increase hydration but replace electrolyte with adult Pedialyte is helpful. Gatorade is another option.  If all else fails have patient go to emergency department as dehydration is detrimental for geriatric patient.

## 2021-12-07 NOTE — Telephone Encounter (Signed)
Thinks patient is dehydrated and wants to know what she can do for patient. She has had diarrhea for a few days but refuses to make an appointment. Please call back to advise.

## 2021-12-08 DIAGNOSIS — D689 Coagulation defect, unspecified: Secondary | ICD-10-CM | POA: Diagnosis not present

## 2021-12-08 DIAGNOSIS — R519 Headache, unspecified: Secondary | ICD-10-CM | POA: Diagnosis not present

## 2021-12-08 DIAGNOSIS — Z4901 Encounter for fitting and adjustment of extracorporeal dialysis catheter: Secondary | ICD-10-CM | POA: Diagnosis not present

## 2021-12-08 DIAGNOSIS — N2581 Secondary hyperparathyroidism of renal origin: Secondary | ICD-10-CM | POA: Diagnosis not present

## 2021-12-08 DIAGNOSIS — Z992 Dependence on renal dialysis: Secondary | ICD-10-CM | POA: Diagnosis not present

## 2021-12-08 DIAGNOSIS — N186 End stage renal disease: Secondary | ICD-10-CM | POA: Diagnosis not present

## 2021-12-10 DIAGNOSIS — Z4901 Encounter for fitting and adjustment of extracorporeal dialysis catheter: Secondary | ICD-10-CM | POA: Diagnosis not present

## 2021-12-10 DIAGNOSIS — Z992 Dependence on renal dialysis: Secondary | ICD-10-CM | POA: Diagnosis not present

## 2021-12-10 DIAGNOSIS — D689 Coagulation defect, unspecified: Secondary | ICD-10-CM | POA: Diagnosis not present

## 2021-12-10 DIAGNOSIS — R519 Headache, unspecified: Secondary | ICD-10-CM | POA: Diagnosis not present

## 2021-12-10 DIAGNOSIS — N2581 Secondary hyperparathyroidism of renal origin: Secondary | ICD-10-CM | POA: Diagnosis not present

## 2021-12-10 DIAGNOSIS — N186 End stage renal disease: Secondary | ICD-10-CM | POA: Diagnosis not present

## 2021-12-13 ENCOUNTER — Ambulatory Visit (INDEPENDENT_AMBULATORY_CARE_PROVIDER_SITE_OTHER): Payer: Medicare Other | Admitting: *Deleted

## 2021-12-13 DIAGNOSIS — Z4901 Encounter for fitting and adjustment of extracorporeal dialysis catheter: Secondary | ICD-10-CM | POA: Diagnosis not present

## 2021-12-13 DIAGNOSIS — D689 Coagulation defect, unspecified: Secondary | ICD-10-CM | POA: Diagnosis not present

## 2021-12-13 DIAGNOSIS — R519 Headache, unspecified: Secondary | ICD-10-CM | POA: Diagnosis not present

## 2021-12-13 DIAGNOSIS — N2581 Secondary hyperparathyroidism of renal origin: Secondary | ICD-10-CM | POA: Diagnosis not present

## 2021-12-13 DIAGNOSIS — M810 Age-related osteoporosis without current pathological fracture: Secondary | ICD-10-CM

## 2021-12-13 DIAGNOSIS — Z992 Dependence on renal dialysis: Secondary | ICD-10-CM | POA: Diagnosis not present

## 2021-12-13 DIAGNOSIS — N186 End stage renal disease: Secondary | ICD-10-CM | POA: Diagnosis not present

## 2021-12-13 MED ORDER — ROMOSOZUMAB-AQQG 105 MG/1.17ML ~~LOC~~ SOSY
210.0000 mg | PREFILLED_SYRINGE | Freq: Once | SUBCUTANEOUS | Status: AC
  Administered 2021-12-13: 210 mg via SUBCUTANEOUS

## 2021-12-13 NOTE — Progress Notes (Signed)
Pt in today for Evenity injections. Given in Right & Left abdomen. Pt tolerated well.

## 2021-12-15 DIAGNOSIS — D689 Coagulation defect, unspecified: Secondary | ICD-10-CM | POA: Diagnosis not present

## 2021-12-15 DIAGNOSIS — N2581 Secondary hyperparathyroidism of renal origin: Secondary | ICD-10-CM | POA: Diagnosis not present

## 2021-12-15 DIAGNOSIS — Z992 Dependence on renal dialysis: Secondary | ICD-10-CM | POA: Diagnosis not present

## 2021-12-15 DIAGNOSIS — Z4901 Encounter for fitting and adjustment of extracorporeal dialysis catheter: Secondary | ICD-10-CM | POA: Diagnosis not present

## 2021-12-15 DIAGNOSIS — R519 Headache, unspecified: Secondary | ICD-10-CM | POA: Diagnosis not present

## 2021-12-15 DIAGNOSIS — N186 End stage renal disease: Secondary | ICD-10-CM | POA: Diagnosis not present

## 2021-12-17 DIAGNOSIS — R519 Headache, unspecified: Secondary | ICD-10-CM | POA: Diagnosis not present

## 2021-12-17 DIAGNOSIS — Z992 Dependence on renal dialysis: Secondary | ICD-10-CM | POA: Diagnosis not present

## 2021-12-17 DIAGNOSIS — Z4901 Encounter for fitting and adjustment of extracorporeal dialysis catheter: Secondary | ICD-10-CM | POA: Diagnosis not present

## 2021-12-17 DIAGNOSIS — D689 Coagulation defect, unspecified: Secondary | ICD-10-CM | POA: Diagnosis not present

## 2021-12-17 DIAGNOSIS — N2581 Secondary hyperparathyroidism of renal origin: Secondary | ICD-10-CM | POA: Diagnosis not present

## 2021-12-17 DIAGNOSIS — N186 End stage renal disease: Secondary | ICD-10-CM | POA: Diagnosis not present

## 2021-12-20 DIAGNOSIS — Z992 Dependence on renal dialysis: Secondary | ICD-10-CM | POA: Diagnosis not present

## 2021-12-20 DIAGNOSIS — D689 Coagulation defect, unspecified: Secondary | ICD-10-CM | POA: Diagnosis not present

## 2021-12-20 DIAGNOSIS — Z4901 Encounter for fitting and adjustment of extracorporeal dialysis catheter: Secondary | ICD-10-CM | POA: Diagnosis not present

## 2021-12-20 DIAGNOSIS — N2581 Secondary hyperparathyroidism of renal origin: Secondary | ICD-10-CM | POA: Diagnosis not present

## 2021-12-20 DIAGNOSIS — R519 Headache, unspecified: Secondary | ICD-10-CM | POA: Diagnosis not present

## 2021-12-20 DIAGNOSIS — N186 End stage renal disease: Secondary | ICD-10-CM | POA: Diagnosis not present

## 2021-12-22 DIAGNOSIS — Z4901 Encounter for fitting and adjustment of extracorporeal dialysis catheter: Secondary | ICD-10-CM | POA: Diagnosis not present

## 2021-12-22 DIAGNOSIS — Z992 Dependence on renal dialysis: Secondary | ICD-10-CM | POA: Diagnosis not present

## 2021-12-22 DIAGNOSIS — D689 Coagulation defect, unspecified: Secondary | ICD-10-CM | POA: Diagnosis not present

## 2021-12-22 DIAGNOSIS — R519 Headache, unspecified: Secondary | ICD-10-CM | POA: Diagnosis not present

## 2021-12-22 DIAGNOSIS — N2581 Secondary hyperparathyroidism of renal origin: Secondary | ICD-10-CM | POA: Diagnosis not present

## 2021-12-22 DIAGNOSIS — N186 End stage renal disease: Secondary | ICD-10-CM | POA: Diagnosis not present

## 2021-12-22 MED ORDER — ROMOSOZUMAB-AQQG 105 MG/1.17ML ~~LOC~~ SOSY
210.0000 mg | PREFILLED_SYRINGE | Freq: Once | SUBCUTANEOUS | Status: AC
  Administered 2021-12-13: 210 mg via SUBCUTANEOUS

## 2021-12-22 NOTE — Addendum Note (Signed)
Addended by: Antonietta Barcelona D on: 12/22/2021 02:05 PM   Modules accepted: Orders

## 2021-12-24 ENCOUNTER — Other Ambulatory Visit: Payer: Self-pay | Admitting: Nurse Practitioner

## 2021-12-24 ENCOUNTER — Other Ambulatory Visit: Payer: Self-pay | Admitting: Family Medicine

## 2021-12-24 DIAGNOSIS — Z4901 Encounter for fitting and adjustment of extracorporeal dialysis catheter: Secondary | ICD-10-CM | POA: Diagnosis not present

## 2021-12-24 DIAGNOSIS — M79604 Pain in right leg: Secondary | ICD-10-CM

## 2021-12-24 DIAGNOSIS — N2581 Secondary hyperparathyroidism of renal origin: Secondary | ICD-10-CM | POA: Diagnosis not present

## 2021-12-24 DIAGNOSIS — D689 Coagulation defect, unspecified: Secondary | ICD-10-CM | POA: Diagnosis not present

## 2021-12-24 DIAGNOSIS — R519 Headache, unspecified: Secondary | ICD-10-CM | POA: Diagnosis not present

## 2021-12-24 DIAGNOSIS — Z992 Dependence on renal dialysis: Secondary | ICD-10-CM | POA: Diagnosis not present

## 2021-12-24 DIAGNOSIS — N186 End stage renal disease: Secondary | ICD-10-CM | POA: Diagnosis not present

## 2021-12-27 DIAGNOSIS — D689 Coagulation defect, unspecified: Secondary | ICD-10-CM | POA: Diagnosis not present

## 2021-12-27 DIAGNOSIS — R519 Headache, unspecified: Secondary | ICD-10-CM | POA: Diagnosis not present

## 2021-12-27 DIAGNOSIS — N2581 Secondary hyperparathyroidism of renal origin: Secondary | ICD-10-CM | POA: Diagnosis not present

## 2021-12-27 DIAGNOSIS — N186 End stage renal disease: Secondary | ICD-10-CM | POA: Diagnosis not present

## 2021-12-27 DIAGNOSIS — Z4901 Encounter for fitting and adjustment of extracorporeal dialysis catheter: Secondary | ICD-10-CM | POA: Diagnosis not present

## 2021-12-27 DIAGNOSIS — Z992 Dependence on renal dialysis: Secondary | ICD-10-CM | POA: Diagnosis not present

## 2021-12-29 DIAGNOSIS — N2581 Secondary hyperparathyroidism of renal origin: Secondary | ICD-10-CM | POA: Diagnosis not present

## 2021-12-29 DIAGNOSIS — Z4901 Encounter for fitting and adjustment of extracorporeal dialysis catheter: Secondary | ICD-10-CM | POA: Diagnosis not present

## 2021-12-29 DIAGNOSIS — Z992 Dependence on renal dialysis: Secondary | ICD-10-CM | POA: Diagnosis not present

## 2021-12-29 DIAGNOSIS — N186 End stage renal disease: Secondary | ICD-10-CM | POA: Diagnosis not present

## 2021-12-29 DIAGNOSIS — D689 Coagulation defect, unspecified: Secondary | ICD-10-CM | POA: Diagnosis not present

## 2021-12-29 DIAGNOSIS — R519 Headache, unspecified: Secondary | ICD-10-CM | POA: Diagnosis not present

## 2021-12-30 ENCOUNTER — Telehealth: Payer: Self-pay | Admitting: Pharmacist

## 2021-12-30 NOTE — Telephone Encounter (Signed)
EVENITY sq injection to arrive via fedex to [facility] on 01/04/22 Medication for osteoporosis 2 separate sq injections (last given in patient's abdomen due to lack of sq in arms)  Promptly open and place refrigerated medications under refrigeration until the time of administration preparation   This is month 3/12 total months.  After this treatment is complete, we will transition patient to Prolia therapy indefinitely   Can we get patient scheduled with triage for monthly shot?

## 2021-12-30 NOTE — Telephone Encounter (Signed)
Attempted to contact - NA 

## 2021-12-31 DIAGNOSIS — N2581 Secondary hyperparathyroidism of renal origin: Secondary | ICD-10-CM | POA: Diagnosis not present

## 2021-12-31 DIAGNOSIS — Z4901 Encounter for fitting and adjustment of extracorporeal dialysis catheter: Secondary | ICD-10-CM | POA: Diagnosis not present

## 2021-12-31 DIAGNOSIS — Z992 Dependence on renal dialysis: Secondary | ICD-10-CM | POA: Diagnosis not present

## 2021-12-31 DIAGNOSIS — R519 Headache, unspecified: Secondary | ICD-10-CM | POA: Diagnosis not present

## 2021-12-31 DIAGNOSIS — N186 End stage renal disease: Secondary | ICD-10-CM | POA: Diagnosis not present

## 2021-12-31 DIAGNOSIS — D689 Coagulation defect, unspecified: Secondary | ICD-10-CM | POA: Diagnosis not present

## 2022-01-01 DIAGNOSIS — N186 End stage renal disease: Secondary | ICD-10-CM | POA: Diagnosis not present

## 2022-01-01 DIAGNOSIS — E1122 Type 2 diabetes mellitus with diabetic chronic kidney disease: Secondary | ICD-10-CM | POA: Diagnosis not present

## 2022-01-01 DIAGNOSIS — Z992 Dependence on renal dialysis: Secondary | ICD-10-CM | POA: Diagnosis not present

## 2022-01-03 DIAGNOSIS — N185 Chronic kidney disease, stage 5: Secondary | ICD-10-CM | POA: Diagnosis not present

## 2022-01-03 DIAGNOSIS — R519 Headache, unspecified: Secondary | ICD-10-CM | POA: Diagnosis not present

## 2022-01-03 DIAGNOSIS — Z992 Dependence on renal dialysis: Secondary | ICD-10-CM | POA: Diagnosis not present

## 2022-01-03 DIAGNOSIS — N186 End stage renal disease: Secondary | ICD-10-CM | POA: Diagnosis not present

## 2022-01-03 DIAGNOSIS — Z4901 Encounter for fitting and adjustment of extracorporeal dialysis catheter: Secondary | ICD-10-CM | POA: Diagnosis not present

## 2022-01-03 DIAGNOSIS — Z23 Encounter for immunization: Secondary | ICD-10-CM | POA: Diagnosis not present

## 2022-01-03 DIAGNOSIS — D631 Anemia in chronic kidney disease: Secondary | ICD-10-CM | POA: Diagnosis not present

## 2022-01-03 DIAGNOSIS — E1121 Type 2 diabetes mellitus with diabetic nephropathy: Secondary | ICD-10-CM | POA: Diagnosis not present

## 2022-01-03 DIAGNOSIS — N2581 Secondary hyperparathyroidism of renal origin: Secondary | ICD-10-CM | POA: Diagnosis not present

## 2022-01-03 DIAGNOSIS — D689 Coagulation defect, unspecified: Secondary | ICD-10-CM | POA: Diagnosis not present

## 2022-01-05 DIAGNOSIS — Z4901 Encounter for fitting and adjustment of extracorporeal dialysis catheter: Secondary | ICD-10-CM | POA: Diagnosis not present

## 2022-01-05 DIAGNOSIS — R519 Headache, unspecified: Secondary | ICD-10-CM | POA: Diagnosis not present

## 2022-01-05 DIAGNOSIS — Z23 Encounter for immunization: Secondary | ICD-10-CM | POA: Diagnosis not present

## 2022-01-05 DIAGNOSIS — D689 Coagulation defect, unspecified: Secondary | ICD-10-CM | POA: Diagnosis not present

## 2022-01-05 DIAGNOSIS — Z992 Dependence on renal dialysis: Secondary | ICD-10-CM | POA: Diagnosis not present

## 2022-01-05 DIAGNOSIS — N186 End stage renal disease: Secondary | ICD-10-CM | POA: Diagnosis not present

## 2022-01-05 DIAGNOSIS — N185 Chronic kidney disease, stage 5: Secondary | ICD-10-CM | POA: Diagnosis not present

## 2022-01-05 DIAGNOSIS — N2581 Secondary hyperparathyroidism of renal origin: Secondary | ICD-10-CM | POA: Diagnosis not present

## 2022-01-05 DIAGNOSIS — D631 Anemia in chronic kidney disease: Secondary | ICD-10-CM | POA: Diagnosis not present

## 2022-01-05 DIAGNOSIS — E1121 Type 2 diabetes mellitus with diabetic nephropathy: Secondary | ICD-10-CM | POA: Diagnosis not present

## 2022-01-07 DIAGNOSIS — N2581 Secondary hyperparathyroidism of renal origin: Secondary | ICD-10-CM | POA: Diagnosis not present

## 2022-01-07 DIAGNOSIS — N186 End stage renal disease: Secondary | ICD-10-CM | POA: Diagnosis not present

## 2022-01-07 DIAGNOSIS — D689 Coagulation defect, unspecified: Secondary | ICD-10-CM | POA: Diagnosis not present

## 2022-01-07 DIAGNOSIS — E1121 Type 2 diabetes mellitus with diabetic nephropathy: Secondary | ICD-10-CM | POA: Diagnosis not present

## 2022-01-07 DIAGNOSIS — R519 Headache, unspecified: Secondary | ICD-10-CM | POA: Diagnosis not present

## 2022-01-07 DIAGNOSIS — Z23 Encounter for immunization: Secondary | ICD-10-CM | POA: Diagnosis not present

## 2022-01-07 DIAGNOSIS — N185 Chronic kidney disease, stage 5: Secondary | ICD-10-CM | POA: Diagnosis not present

## 2022-01-07 DIAGNOSIS — D631 Anemia in chronic kidney disease: Secondary | ICD-10-CM | POA: Diagnosis not present

## 2022-01-07 DIAGNOSIS — Z4901 Encounter for fitting and adjustment of extracorporeal dialysis catheter: Secondary | ICD-10-CM | POA: Diagnosis not present

## 2022-01-07 DIAGNOSIS — Z992 Dependence on renal dialysis: Secondary | ICD-10-CM | POA: Diagnosis not present

## 2022-01-10 DIAGNOSIS — Z4901 Encounter for fitting and adjustment of extracorporeal dialysis catheter: Secondary | ICD-10-CM | POA: Diagnosis not present

## 2022-01-10 DIAGNOSIS — Z992 Dependence on renal dialysis: Secondary | ICD-10-CM | POA: Diagnosis not present

## 2022-01-10 DIAGNOSIS — N186 End stage renal disease: Secondary | ICD-10-CM | POA: Diagnosis not present

## 2022-01-10 DIAGNOSIS — E1121 Type 2 diabetes mellitus with diabetic nephropathy: Secondary | ICD-10-CM | POA: Diagnosis not present

## 2022-01-10 DIAGNOSIS — R519 Headache, unspecified: Secondary | ICD-10-CM | POA: Diagnosis not present

## 2022-01-10 DIAGNOSIS — N185 Chronic kidney disease, stage 5: Secondary | ICD-10-CM | POA: Diagnosis not present

## 2022-01-10 DIAGNOSIS — D689 Coagulation defect, unspecified: Secondary | ICD-10-CM | POA: Diagnosis not present

## 2022-01-10 DIAGNOSIS — Z23 Encounter for immunization: Secondary | ICD-10-CM | POA: Diagnosis not present

## 2022-01-10 DIAGNOSIS — D631 Anemia in chronic kidney disease: Secondary | ICD-10-CM | POA: Diagnosis not present

## 2022-01-10 DIAGNOSIS — N2581 Secondary hyperparathyroidism of renal origin: Secondary | ICD-10-CM | POA: Diagnosis not present

## 2022-01-12 DIAGNOSIS — Z4901 Encounter for fitting and adjustment of extracorporeal dialysis catheter: Secondary | ICD-10-CM | POA: Diagnosis not present

## 2022-01-12 DIAGNOSIS — E1121 Type 2 diabetes mellitus with diabetic nephropathy: Secondary | ICD-10-CM | POA: Diagnosis not present

## 2022-01-12 DIAGNOSIS — N186 End stage renal disease: Secondary | ICD-10-CM | POA: Diagnosis not present

## 2022-01-12 DIAGNOSIS — R519 Headache, unspecified: Secondary | ICD-10-CM | POA: Diagnosis not present

## 2022-01-12 DIAGNOSIS — D689 Coagulation defect, unspecified: Secondary | ICD-10-CM | POA: Diagnosis not present

## 2022-01-12 DIAGNOSIS — Z992 Dependence on renal dialysis: Secondary | ICD-10-CM | POA: Diagnosis not present

## 2022-01-12 DIAGNOSIS — N185 Chronic kidney disease, stage 5: Secondary | ICD-10-CM | POA: Diagnosis not present

## 2022-01-12 DIAGNOSIS — Z23 Encounter for immunization: Secondary | ICD-10-CM | POA: Diagnosis not present

## 2022-01-12 DIAGNOSIS — D631 Anemia in chronic kidney disease: Secondary | ICD-10-CM | POA: Diagnosis not present

## 2022-01-12 DIAGNOSIS — N2581 Secondary hyperparathyroidism of renal origin: Secondary | ICD-10-CM | POA: Diagnosis not present

## 2022-01-13 ENCOUNTER — Ambulatory Visit (INDEPENDENT_AMBULATORY_CARE_PROVIDER_SITE_OTHER): Payer: Medicare Other | Admitting: *Deleted

## 2022-01-13 DIAGNOSIS — M810 Age-related osteoporosis without current pathological fracture: Secondary | ICD-10-CM

## 2022-01-13 MED ORDER — ROMOSOZUMAB-AQQG 105 MG/1.17ML ~~LOC~~ SOSY
210.0000 mg | PREFILLED_SYRINGE | Freq: Once | SUBCUTANEOUS | Status: AC
  Administered 2022-01-13: 210 mg via SUBCUTANEOUS

## 2022-01-14 DIAGNOSIS — R519 Headache, unspecified: Secondary | ICD-10-CM | POA: Diagnosis not present

## 2022-01-14 DIAGNOSIS — Z992 Dependence on renal dialysis: Secondary | ICD-10-CM | POA: Diagnosis not present

## 2022-01-14 DIAGNOSIS — N2581 Secondary hyperparathyroidism of renal origin: Secondary | ICD-10-CM | POA: Diagnosis not present

## 2022-01-14 DIAGNOSIS — D631 Anemia in chronic kidney disease: Secondary | ICD-10-CM | POA: Diagnosis not present

## 2022-01-14 DIAGNOSIS — Z23 Encounter for immunization: Secondary | ICD-10-CM | POA: Diagnosis not present

## 2022-01-14 DIAGNOSIS — D689 Coagulation defect, unspecified: Secondary | ICD-10-CM | POA: Diagnosis not present

## 2022-01-14 DIAGNOSIS — Z4901 Encounter for fitting and adjustment of extracorporeal dialysis catheter: Secondary | ICD-10-CM | POA: Diagnosis not present

## 2022-01-14 DIAGNOSIS — N186 End stage renal disease: Secondary | ICD-10-CM | POA: Diagnosis not present

## 2022-01-14 DIAGNOSIS — E1121 Type 2 diabetes mellitus with diabetic nephropathy: Secondary | ICD-10-CM | POA: Diagnosis not present

## 2022-01-14 DIAGNOSIS — N185 Chronic kidney disease, stage 5: Secondary | ICD-10-CM | POA: Diagnosis not present

## 2022-01-17 DIAGNOSIS — N186 End stage renal disease: Secondary | ICD-10-CM | POA: Diagnosis not present

## 2022-01-17 DIAGNOSIS — Z23 Encounter for immunization: Secondary | ICD-10-CM | POA: Diagnosis not present

## 2022-01-17 DIAGNOSIS — D689 Coagulation defect, unspecified: Secondary | ICD-10-CM | POA: Diagnosis not present

## 2022-01-17 DIAGNOSIS — N185 Chronic kidney disease, stage 5: Secondary | ICD-10-CM | POA: Diagnosis not present

## 2022-01-17 DIAGNOSIS — N2581 Secondary hyperparathyroidism of renal origin: Secondary | ICD-10-CM | POA: Diagnosis not present

## 2022-01-17 DIAGNOSIS — E1121 Type 2 diabetes mellitus with diabetic nephropathy: Secondary | ICD-10-CM | POA: Diagnosis not present

## 2022-01-17 DIAGNOSIS — Z992 Dependence on renal dialysis: Secondary | ICD-10-CM | POA: Diagnosis not present

## 2022-01-17 DIAGNOSIS — D631 Anemia in chronic kidney disease: Secondary | ICD-10-CM | POA: Diagnosis not present

## 2022-01-17 DIAGNOSIS — R519 Headache, unspecified: Secondary | ICD-10-CM | POA: Diagnosis not present

## 2022-01-17 DIAGNOSIS — Z4901 Encounter for fitting and adjustment of extracorporeal dialysis catheter: Secondary | ICD-10-CM | POA: Diagnosis not present

## 2022-01-19 DIAGNOSIS — D689 Coagulation defect, unspecified: Secondary | ICD-10-CM | POA: Diagnosis not present

## 2022-01-19 DIAGNOSIS — Z992 Dependence on renal dialysis: Secondary | ICD-10-CM | POA: Diagnosis not present

## 2022-01-19 DIAGNOSIS — Z4901 Encounter for fitting and adjustment of extracorporeal dialysis catheter: Secondary | ICD-10-CM | POA: Diagnosis not present

## 2022-01-19 DIAGNOSIS — N186 End stage renal disease: Secondary | ICD-10-CM | POA: Diagnosis not present

## 2022-01-19 DIAGNOSIS — N185 Chronic kidney disease, stage 5: Secondary | ICD-10-CM | POA: Diagnosis not present

## 2022-01-19 DIAGNOSIS — E1121 Type 2 diabetes mellitus with diabetic nephropathy: Secondary | ICD-10-CM | POA: Diagnosis not present

## 2022-01-19 DIAGNOSIS — Z23 Encounter for immunization: Secondary | ICD-10-CM | POA: Diagnosis not present

## 2022-01-19 DIAGNOSIS — D631 Anemia in chronic kidney disease: Secondary | ICD-10-CM | POA: Diagnosis not present

## 2022-01-19 DIAGNOSIS — N2581 Secondary hyperparathyroidism of renal origin: Secondary | ICD-10-CM | POA: Diagnosis not present

## 2022-01-19 DIAGNOSIS — R519 Headache, unspecified: Secondary | ICD-10-CM | POA: Diagnosis not present

## 2022-01-21 DIAGNOSIS — E1121 Type 2 diabetes mellitus with diabetic nephropathy: Secondary | ICD-10-CM | POA: Diagnosis not present

## 2022-01-21 DIAGNOSIS — N186 End stage renal disease: Secondary | ICD-10-CM | POA: Diagnosis not present

## 2022-01-21 DIAGNOSIS — N2581 Secondary hyperparathyroidism of renal origin: Secondary | ICD-10-CM | POA: Diagnosis not present

## 2022-01-21 DIAGNOSIS — N185 Chronic kidney disease, stage 5: Secondary | ICD-10-CM | POA: Diagnosis not present

## 2022-01-21 DIAGNOSIS — Z4901 Encounter for fitting and adjustment of extracorporeal dialysis catheter: Secondary | ICD-10-CM | POA: Diagnosis not present

## 2022-01-21 DIAGNOSIS — D631 Anemia in chronic kidney disease: Secondary | ICD-10-CM | POA: Diagnosis not present

## 2022-01-21 DIAGNOSIS — R519 Headache, unspecified: Secondary | ICD-10-CM | POA: Diagnosis not present

## 2022-01-21 DIAGNOSIS — Z992 Dependence on renal dialysis: Secondary | ICD-10-CM | POA: Diagnosis not present

## 2022-01-21 DIAGNOSIS — Z23 Encounter for immunization: Secondary | ICD-10-CM | POA: Diagnosis not present

## 2022-01-21 DIAGNOSIS — D689 Coagulation defect, unspecified: Secondary | ICD-10-CM | POA: Diagnosis not present

## 2022-01-24 DIAGNOSIS — Z23 Encounter for immunization: Secondary | ICD-10-CM | POA: Diagnosis not present

## 2022-01-24 DIAGNOSIS — N186 End stage renal disease: Secondary | ICD-10-CM | POA: Diagnosis not present

## 2022-01-24 DIAGNOSIS — D631 Anemia in chronic kidney disease: Secondary | ICD-10-CM | POA: Diagnosis not present

## 2022-01-24 DIAGNOSIS — Z4901 Encounter for fitting and adjustment of extracorporeal dialysis catheter: Secondary | ICD-10-CM | POA: Diagnosis not present

## 2022-01-24 DIAGNOSIS — R519 Headache, unspecified: Secondary | ICD-10-CM | POA: Diagnosis not present

## 2022-01-24 DIAGNOSIS — D689 Coagulation defect, unspecified: Secondary | ICD-10-CM | POA: Diagnosis not present

## 2022-01-24 DIAGNOSIS — Z992 Dependence on renal dialysis: Secondary | ICD-10-CM | POA: Diagnosis not present

## 2022-01-24 DIAGNOSIS — N185 Chronic kidney disease, stage 5: Secondary | ICD-10-CM | POA: Diagnosis not present

## 2022-01-24 DIAGNOSIS — E1121 Type 2 diabetes mellitus with diabetic nephropathy: Secondary | ICD-10-CM | POA: Diagnosis not present

## 2022-01-24 DIAGNOSIS — N2581 Secondary hyperparathyroidism of renal origin: Secondary | ICD-10-CM | POA: Diagnosis not present

## 2022-01-26 DIAGNOSIS — N185 Chronic kidney disease, stage 5: Secondary | ICD-10-CM | POA: Diagnosis not present

## 2022-01-26 DIAGNOSIS — D631 Anemia in chronic kidney disease: Secondary | ICD-10-CM | POA: Diagnosis not present

## 2022-01-26 DIAGNOSIS — E1121 Type 2 diabetes mellitus with diabetic nephropathy: Secondary | ICD-10-CM | POA: Diagnosis not present

## 2022-01-26 DIAGNOSIS — Z4901 Encounter for fitting and adjustment of extracorporeal dialysis catheter: Secondary | ICD-10-CM | POA: Diagnosis not present

## 2022-01-26 DIAGNOSIS — Z23 Encounter for immunization: Secondary | ICD-10-CM | POA: Diagnosis not present

## 2022-01-26 DIAGNOSIS — Z992 Dependence on renal dialysis: Secondary | ICD-10-CM | POA: Diagnosis not present

## 2022-01-26 DIAGNOSIS — R519 Headache, unspecified: Secondary | ICD-10-CM | POA: Diagnosis not present

## 2022-01-26 DIAGNOSIS — D689 Coagulation defect, unspecified: Secondary | ICD-10-CM | POA: Diagnosis not present

## 2022-01-26 DIAGNOSIS — N2581 Secondary hyperparathyroidism of renal origin: Secondary | ICD-10-CM | POA: Diagnosis not present

## 2022-01-26 DIAGNOSIS — N186 End stage renal disease: Secondary | ICD-10-CM | POA: Diagnosis not present

## 2022-01-27 ENCOUNTER — Ambulatory Visit (INDEPENDENT_AMBULATORY_CARE_PROVIDER_SITE_OTHER): Payer: Medicare Other | Admitting: Family Medicine

## 2022-01-27 ENCOUNTER — Ambulatory Visit (INDEPENDENT_AMBULATORY_CARE_PROVIDER_SITE_OTHER): Payer: Medicare Other

## 2022-01-27 ENCOUNTER — Encounter: Payer: Self-pay | Admitting: Family Medicine

## 2022-01-27 VITALS — BP 141/72 | HR 78 | Temp 98.3°F | Ht 65.0 in | Wt 113.0 lb

## 2022-01-27 DIAGNOSIS — M25521 Pain in right elbow: Secondary | ICD-10-CM

## 2022-01-27 DIAGNOSIS — M25421 Effusion, right elbow: Secondary | ICD-10-CM | POA: Diagnosis not present

## 2022-01-27 DIAGNOSIS — M7989 Other specified soft tissue disorders: Secondary | ICD-10-CM | POA: Diagnosis not present

## 2022-01-27 MED ORDER — TRAMADOL HCL 50 MG PO TABS: 50.0000 mg | ORAL_TABLET | Freq: Three times a day (TID) | ORAL | 0 refills | Status: AC | PRN

## 2022-01-27 NOTE — Patient Instructions (Signed)
Urgent referral to ortho placed today.   Keep sling on until then.

## 2022-01-27 NOTE — Progress Notes (Signed)
   Acute Office Visit  Subjective:     Patient ID: [name], female    DOB: [birth year], 81 y.o.   MRN: [id number]  Chief Complaint  Patient presents with   Joint Swelling    HPI Patient is in today for right elbow pain and swelling. She was walking with her hands on her walker this morning. She felt a pop in her right elbow, followed by immediate pain and swelling. She reports constant severe pain since then. She denies a fall or injury to the elbow. She can move her wrist and shoulder well but has limited ROM of this elbow. The elbow is also tender. No erythema. She is on chronic opioids, however she has been out for a few weeks due to a Producer, television/film/video of Norco. She has not taken anything for the pain.   ROS As per HPI.      Objective:    BP (!) 141/72   Pulse 78   Temp 98.3 F (36.8 C) (Temporal)   Ht '5\' 5"'$  (1.651 m)   Wt 113 lb (51.3 kg)   SpO2 99%   BMI 18.80 kg/m    Physical Exam Vitals and nursing note reviewed.  HENT:     Head: Normocephalic and atraumatic.  Pulmonary:     Effort: Pulmonary effort is normal. No respiratory distress.  Musculoskeletal:     Right elbow: Swelling present. Decreased range of motion. Tenderness present in lateral epicondyle and olecranon process.     Right lower leg: No edema.     Left lower leg: No edema.     Comments: Unable to extend right elbow. No erythema. Negative hook test.   Skin:    General: Skin is warm and dry.  Neurological:     Mental Status: She is alert and oriented to person, place, and time. Mental status is at baseline.  Psychiatric:        Mood and Affect: Mood normal.        Behavior: Behavior normal.     No results found for any visits on 01/27/22.      Assessment & Plan:   [name] was seen today for joint swelling.  Diagnoses and all orders for this visit:  Pain and swelling of right elbow Xray negative for fracture today. Concern for injury of triceps tendon given history of "pop" prior to  pain and swelling and xray findings. Patient placed in sling and stat ortho referral placed today. Tramadol ordered as there is currently a Set designer. PDMP review reflects that patient has not picked up [place] since 12/07/21. -     DG Elbow 2 Views Right; Future -     Ambulatory referral to Orthopedic Surgery -     traMADol (ULTRAM) 50 MG tablet; Take 1 tablet (50 mg total) by mouth every 8 (eight) hours as needed for up to 5 days.   The patient indicates understanding of these issues and agrees with the plan.  Gwenlyn Perking, FNP

## 2022-01-28 ENCOUNTER — Telehealth: Payer: Self-pay | Admitting: Nurse Practitioner

## 2022-01-28 DIAGNOSIS — N185 Chronic kidney disease, stage 5: Secondary | ICD-10-CM | POA: Diagnosis not present

## 2022-01-28 DIAGNOSIS — Z23 Encounter for immunization: Secondary | ICD-10-CM | POA: Diagnosis not present

## 2022-01-28 DIAGNOSIS — N2581 Secondary hyperparathyroidism of renal origin: Secondary | ICD-10-CM | POA: Diagnosis not present

## 2022-01-28 DIAGNOSIS — Z4901 Encounter for fitting and adjustment of extracorporeal dialysis catheter: Secondary | ICD-10-CM | POA: Diagnosis not present

## 2022-01-28 DIAGNOSIS — R519 Headache, unspecified: Secondary | ICD-10-CM | POA: Diagnosis not present

## 2022-01-28 DIAGNOSIS — Z992 Dependence on renal dialysis: Secondary | ICD-10-CM | POA: Diagnosis not present

## 2022-01-28 DIAGNOSIS — D631 Anemia in chronic kidney disease: Secondary | ICD-10-CM | POA: Diagnosis not present

## 2022-01-28 DIAGNOSIS — N186 End stage renal disease: Secondary | ICD-10-CM | POA: Diagnosis not present

## 2022-01-28 DIAGNOSIS — D689 Coagulation defect, unspecified: Secondary | ICD-10-CM | POA: Diagnosis not present

## 2022-01-28 DIAGNOSIS — E1121 Type 2 diabetes mellitus with diabetic nephropathy: Secondary | ICD-10-CM | POA: Diagnosis not present

## 2022-01-31 DIAGNOSIS — E1121 Type 2 diabetes mellitus with diabetic nephropathy: Secondary | ICD-10-CM | POA: Diagnosis not present

## 2022-01-31 DIAGNOSIS — Z9981 Dependence on supplemental oxygen: Secondary | ICD-10-CM | POA: Diagnosis not present

## 2022-01-31 DIAGNOSIS — D62 Acute posthemorrhagic anemia: Secondary | ICD-10-CM | POA: Diagnosis not present

## 2022-01-31 DIAGNOSIS — K219 Gastro-esophageal reflux disease without esophagitis: Secondary | ICD-10-CM | POA: Diagnosis not present

## 2022-01-31 DIAGNOSIS — I120 Hypertensive chronic kidney disease with stage 5 chronic kidney disease or end stage renal disease: Secondary | ICD-10-CM | POA: Diagnosis not present

## 2022-01-31 DIAGNOSIS — Z87891 Personal history of nicotine dependence: Secondary | ICD-10-CM | POA: Diagnosis not present

## 2022-01-31 DIAGNOSIS — G2581 Restless legs syndrome: Secondary | ICD-10-CM | POA: Diagnosis not present

## 2022-01-31 DIAGNOSIS — J9611 Chronic respiratory failure with hypoxia: Secondary | ICD-10-CM | POA: Diagnosis not present

## 2022-01-31 DIAGNOSIS — N2581 Secondary hyperparathyroidism of renal origin: Secondary | ICD-10-CM | POA: Diagnosis not present

## 2022-01-31 DIAGNOSIS — I480 Paroxysmal atrial fibrillation: Secondary | ICD-10-CM | POA: Diagnosis not present

## 2022-01-31 DIAGNOSIS — D649 Anemia, unspecified: Secondary | ICD-10-CM | POA: Diagnosis not present

## 2022-01-31 DIAGNOSIS — Z885 Allergy status to narcotic agent status: Secondary | ICD-10-CM | POA: Diagnosis not present

## 2022-01-31 DIAGNOSIS — N185 Chronic kidney disease, stage 5: Secondary | ICD-10-CM | POA: Diagnosis not present

## 2022-01-31 DIAGNOSIS — Z471 Aftercare following joint replacement surgery: Secondary | ICD-10-CM | POA: Diagnosis not present

## 2022-01-31 DIAGNOSIS — S72011A Unspecified intracapsular fracture of right femur, initial encounter for closed fracture: Secondary | ICD-10-CM | POA: Diagnosis not present

## 2022-01-31 DIAGNOSIS — G5602 Carpal tunnel syndrome, left upper limb: Secondary | ICD-10-CM | POA: Diagnosis not present

## 2022-01-31 DIAGNOSIS — E1122 Type 2 diabetes mellitus with diabetic chronic kidney disease: Secondary | ICD-10-CM | POA: Diagnosis not present

## 2022-01-31 DIAGNOSIS — S72001A Fracture of unspecified part of neck of right femur, initial encounter for closed fracture: Secondary | ICD-10-CM | POA: Diagnosis not present

## 2022-01-31 DIAGNOSIS — M1990 Unspecified osteoarthritis, unspecified site: Secondary | ICD-10-CM | POA: Diagnosis not present

## 2022-01-31 DIAGNOSIS — W19XXXD Unspecified fall, subsequent encounter: Secondary | ICD-10-CM | POA: Diagnosis not present

## 2022-01-31 DIAGNOSIS — M25551 Pain in right hip: Secondary | ICD-10-CM | POA: Diagnosis not present

## 2022-01-31 DIAGNOSIS — Z880 Allergy status to penicillin: Secondary | ICD-10-CM | POA: Diagnosis not present

## 2022-01-31 DIAGNOSIS — Z992 Dependence on renal dialysis: Secondary | ICD-10-CM | POA: Diagnosis not present

## 2022-01-31 DIAGNOSIS — Z043 Encounter for examination and observation following other accident: Secondary | ICD-10-CM | POA: Diagnosis not present

## 2022-01-31 DIAGNOSIS — D631 Anemia in chronic kidney disease: Secondary | ICD-10-CM | POA: Diagnosis not present

## 2022-01-31 DIAGNOSIS — S72001D Fracture of unspecified part of neck of right femur, subsequent encounter for closed fracture with routine healing: Secondary | ICD-10-CM | POA: Diagnosis not present

## 2022-01-31 DIAGNOSIS — Z9889 Other specified postprocedural states: Secondary | ICD-10-CM | POA: Diagnosis not present

## 2022-01-31 DIAGNOSIS — L219 Seborrheic dermatitis, unspecified: Secondary | ICD-10-CM | POA: Diagnosis not present

## 2022-01-31 DIAGNOSIS — E039 Hypothyroidism, unspecified: Secondary | ICD-10-CM | POA: Diagnosis not present

## 2022-01-31 DIAGNOSIS — Z66 Do not resuscitate: Secondary | ICD-10-CM | POA: Diagnosis not present

## 2022-01-31 DIAGNOSIS — N186 End stage renal disease: Secondary | ICD-10-CM | POA: Diagnosis not present

## 2022-01-31 DIAGNOSIS — D689 Coagulation defect, unspecified: Secondary | ICD-10-CM | POA: Diagnosis not present

## 2022-01-31 DIAGNOSIS — R519 Headache, unspecified: Secondary | ICD-10-CM | POA: Diagnosis not present

## 2022-01-31 DIAGNOSIS — E1165 Type 2 diabetes mellitus with hyperglycemia: Secondary | ICD-10-CM | POA: Diagnosis not present

## 2022-01-31 DIAGNOSIS — M25521 Pain in right elbow: Secondary | ICD-10-CM | POA: Diagnosis not present

## 2022-01-31 DIAGNOSIS — G4733 Obstructive sleep apnea (adult) (pediatric): Secondary | ICD-10-CM | POA: Diagnosis not present

## 2022-01-31 DIAGNOSIS — Z96641 Presence of right artificial hip joint: Secondary | ICD-10-CM | POA: Diagnosis not present

## 2022-01-31 DIAGNOSIS — Z23 Encounter for immunization: Secondary | ICD-10-CM | POA: Diagnosis not present

## 2022-01-31 DIAGNOSIS — F32A Depression, unspecified: Secondary | ICD-10-CM | POA: Diagnosis not present

## 2022-01-31 DIAGNOSIS — W050XXA Fall from non-moving wheelchair, initial encounter: Secondary | ICD-10-CM | POA: Diagnosis not present

## 2022-01-31 DIAGNOSIS — E785 Hyperlipidemia, unspecified: Secondary | ICD-10-CM | POA: Diagnosis not present

## 2022-01-31 DIAGNOSIS — Z882 Allergy status to sulfonamides status: Secondary | ICD-10-CM | POA: Diagnosis not present

## 2022-01-31 DIAGNOSIS — S72091A Other fracture of head and neck of right femur, initial encounter for closed fracture: Secondary | ICD-10-CM | POA: Diagnosis not present

## 2022-01-31 DIAGNOSIS — J449 Chronic obstructive pulmonary disease, unspecified: Secondary | ICD-10-CM | POA: Diagnosis not present

## 2022-01-31 DIAGNOSIS — Z4901 Encounter for fitting and adjustment of extracorporeal dialysis catheter: Secondary | ICD-10-CM | POA: Diagnosis not present

## 2022-02-01 ENCOUNTER — Ambulatory Visit: Payer: Medicare Other | Admitting: Nurse Practitioner

## 2022-02-01 DIAGNOSIS — N186 End stage renal disease: Secondary | ICD-10-CM | POA: Diagnosis not present

## 2022-02-01 DIAGNOSIS — S72001A Fracture of unspecified part of neck of right femur, initial encounter for closed fracture: Secondary | ICD-10-CM | POA: Diagnosis not present

## 2022-02-01 DIAGNOSIS — Z992 Dependence on renal dialysis: Secondary | ICD-10-CM | POA: Diagnosis not present

## 2022-02-01 DIAGNOSIS — E1122 Type 2 diabetes mellitus with diabetic chronic kidney disease: Secondary | ICD-10-CM | POA: Diagnosis not present

## 2022-02-02 DIAGNOSIS — Z992 Dependence on renal dialysis: Secondary | ICD-10-CM | POA: Diagnosis not present

## 2022-02-02 DIAGNOSIS — N186 End stage renal disease: Secondary | ICD-10-CM | POA: Diagnosis not present

## 2022-02-02 NOTE — Telephone Encounter (Signed)
Encounter closed, [place] was given on 01/13/22

## 2022-02-03 DIAGNOSIS — N186 End stage renal disease: Secondary | ICD-10-CM | POA: Diagnosis not present

## 2022-02-03 DIAGNOSIS — I480 Paroxysmal atrial fibrillation: Secondary | ICD-10-CM | POA: Diagnosis not present

## 2022-02-03 DIAGNOSIS — S72001D Fracture of unspecified part of neck of right femur, subsequent encounter for closed fracture with routine healing: Secondary | ICD-10-CM | POA: Diagnosis not present

## 2022-02-03 DIAGNOSIS — E039 Hypothyroidism, unspecified: Secondary | ICD-10-CM | POA: Diagnosis not present

## 2022-02-03 DIAGNOSIS — D62 Acute posthemorrhagic anemia: Secondary | ICD-10-CM | POA: Diagnosis not present

## 2022-02-04 NOTE — Telephone Encounter (Signed)
I was not in the office Friday afternoon or Monday 01/31/2022 - did not get this message until I was back in the office after pt's appt with ortho

## 2022-02-05 IMAGING — DX DG CHEST 1V PORT
1 series · 1 of 1 positions shown · non-contrast
Comparison: Chest radiograph dated August 14, 2017 and September 06, 2018

CLINICAL DATA: Cough

EXAM:
PORTABLE CHEST 1 VIEW

[chest]
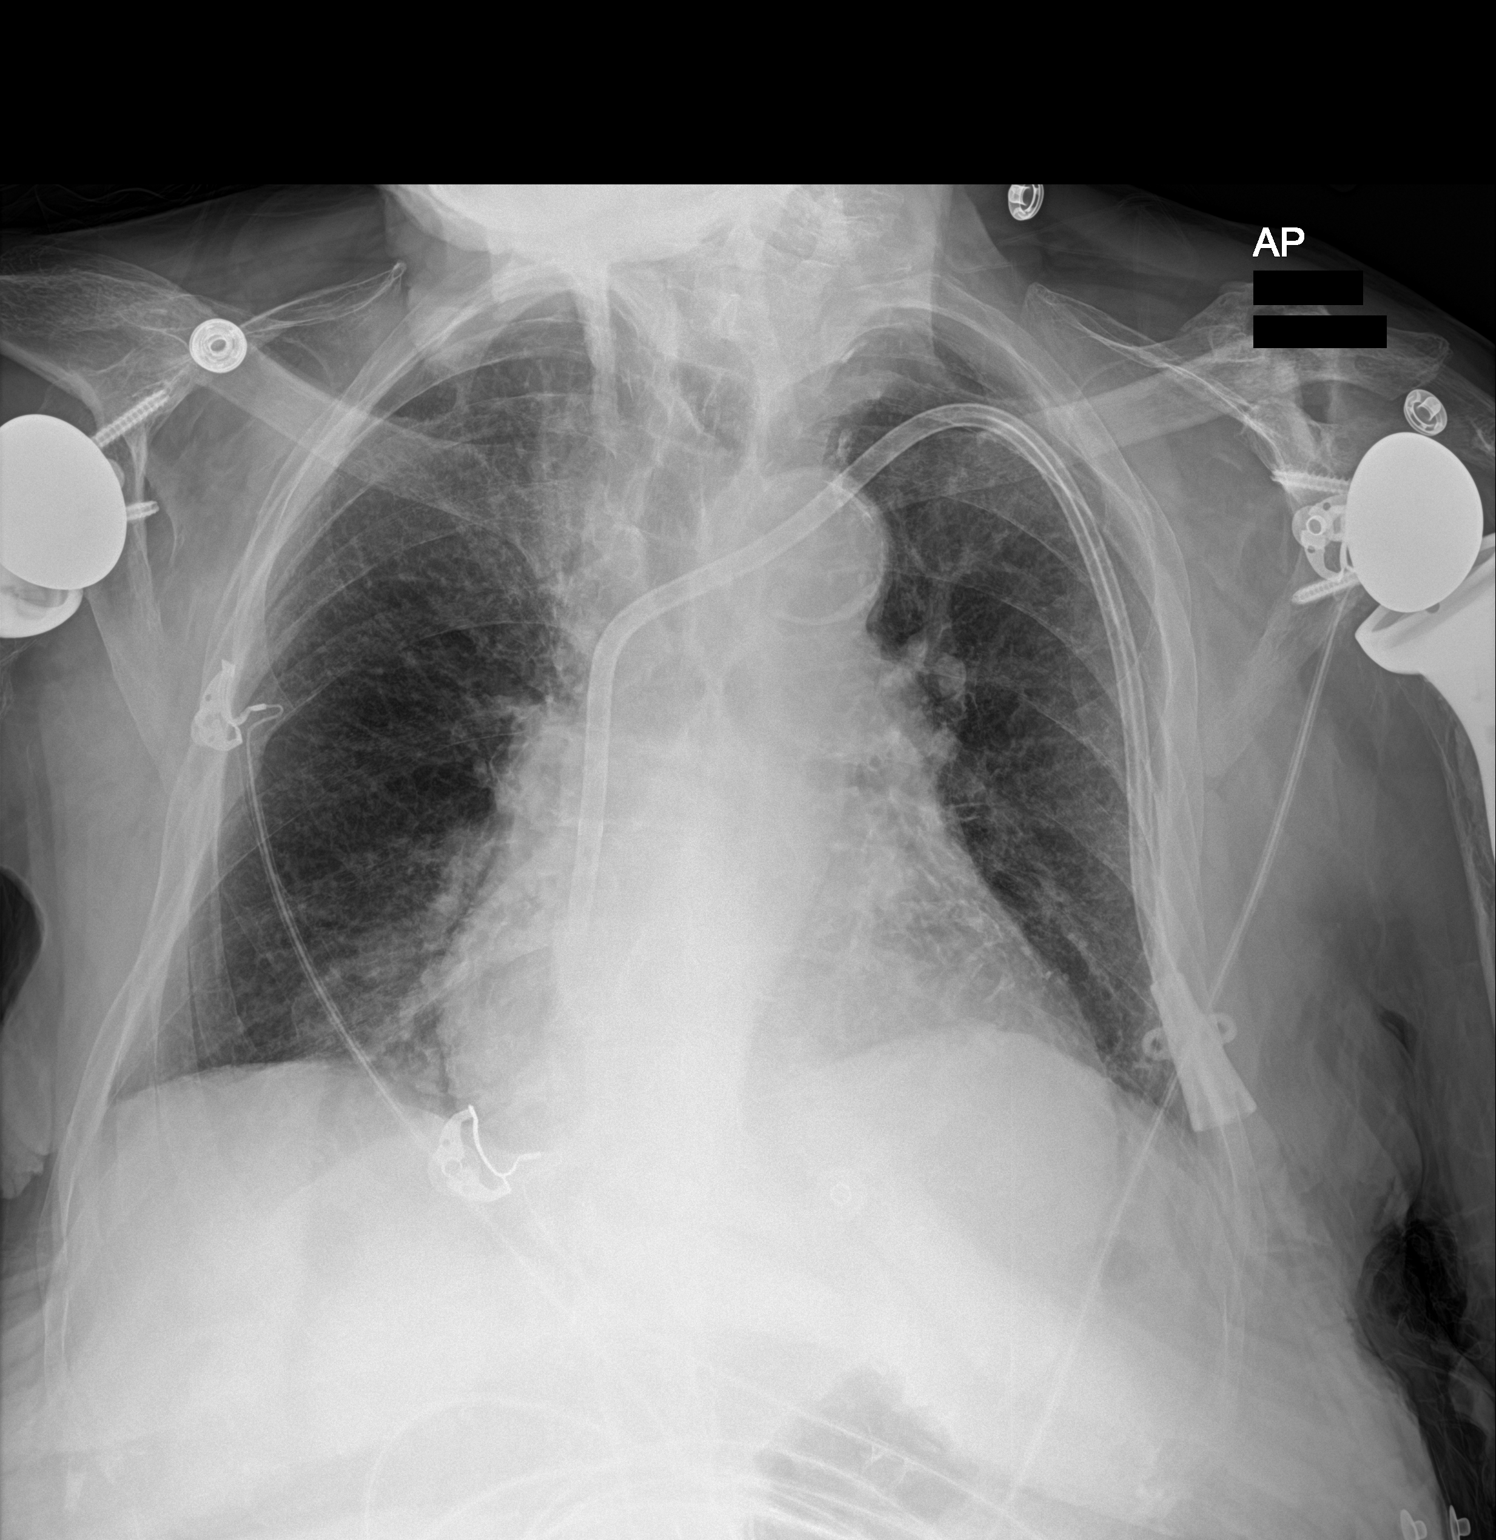

[1 of 1 positions shown; findings below may reference images not displayed]

FINDINGS: The heart size and mediastinal contours are within normal limits.
Aorta is tortuous with atherosclerotic calcifications. Left access
dialysis catheter with distal tip at the cavoatrial junction,
unchanged. Both lungs are clear. Bilateral shoulder arthroplasty.
IMPRESSION: No acute cardiopulmonary process.

## 2022-02-05 IMAGING — CT CT ANGIO CHEST
2 of 6 series · 15 of 46 positions shown · IV contrast (omnipaque)
Comparison: 05/22/2017

CLINICAL DATA: Pulmonary embolism (PE) suspected, high prob. Chest
pain, cough

EXAM:
CT ANGIOGRAPHY CHEST WITH CONTRAST
TECHNIQUE: Multidetector CT imaging of the chest was performed using the
standard protocol during bolus administration of intravenous
contrast. Multiplanar CT image reconstructions and MIPs were
obtained to evaluate the vascular anatomy.
CONTRAST:  70mL OMNIPAQUE IOHEXOL 350 MG/ML SOLN

[Series 6: thins · axial · 0.51mm/px · z∈[+1001,+1205]mm · 12 of 224 slices shown]
[im 10/224  lung]
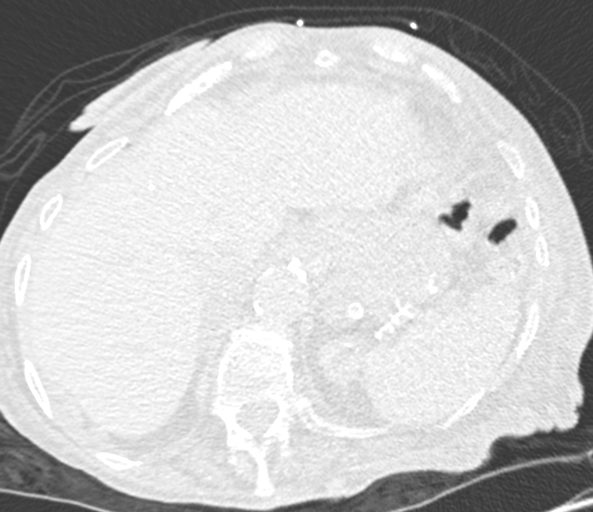
[im 30/224  soft-tissue]
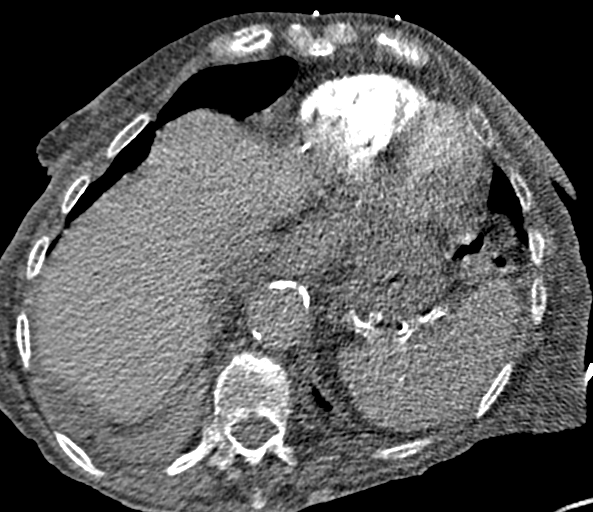
[im 49/224  lung]
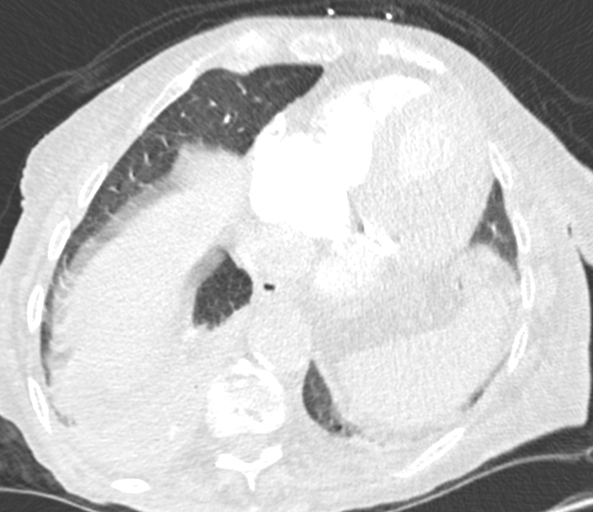
[im 68/224  soft-tissue]
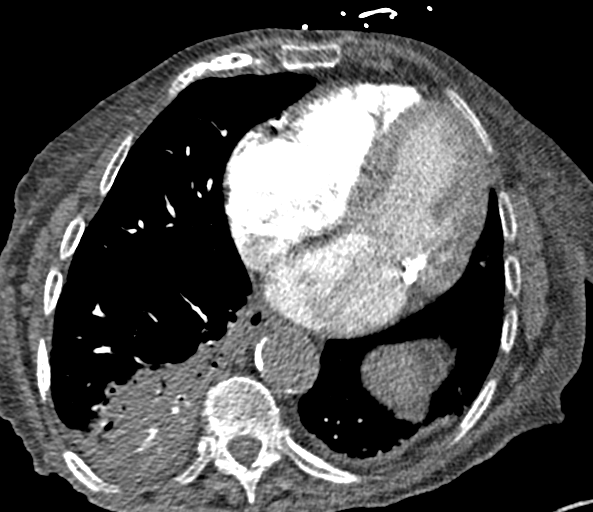
[im 88/224  lung]
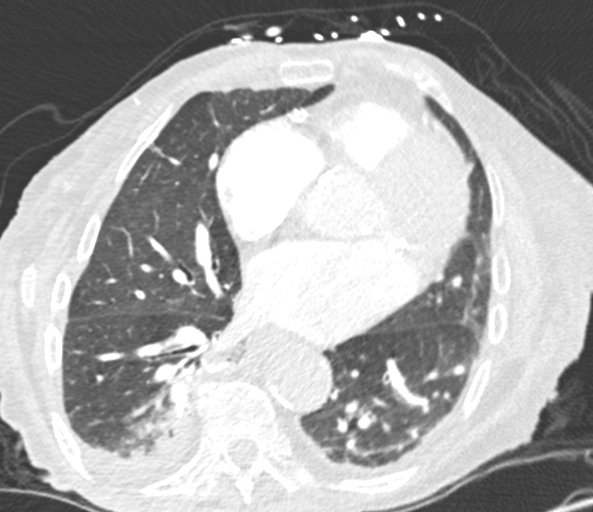
[im 107/224  soft-tissue]
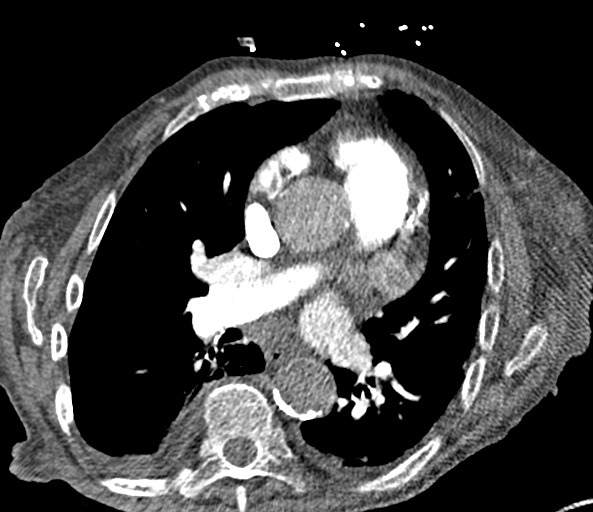
[im 117/224  lung]
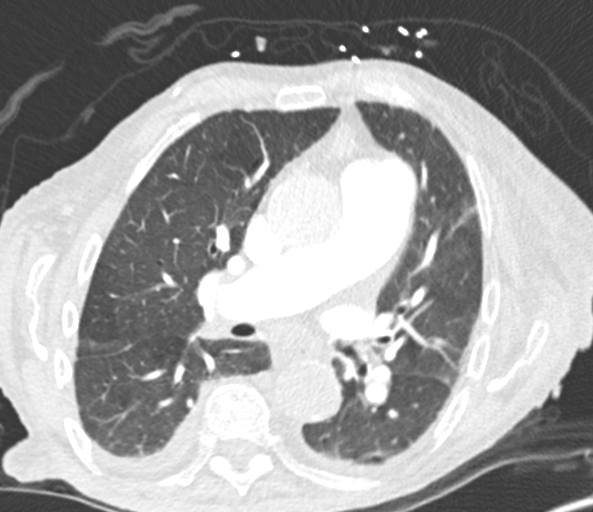
[im 136/224  soft-tissue]
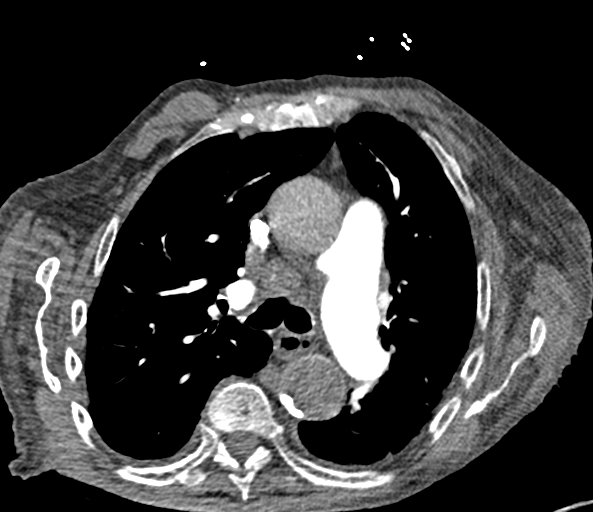
[im 156/224  lung]
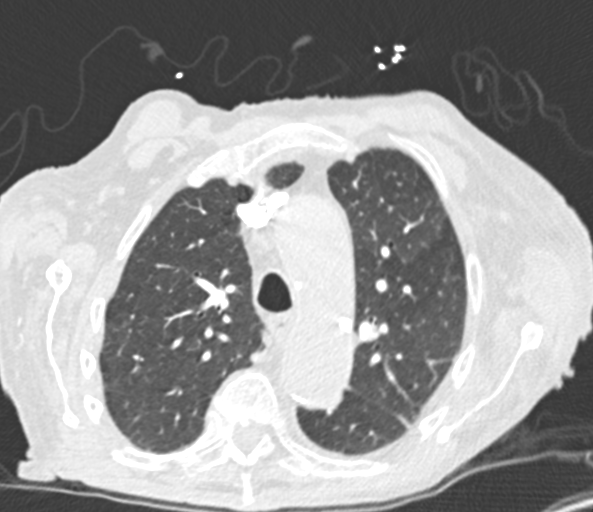
[im 175/224  soft-tissue]
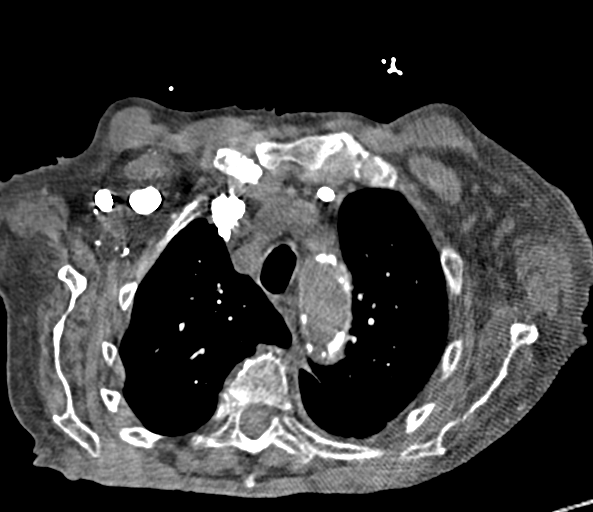
[im 194/224  lung]
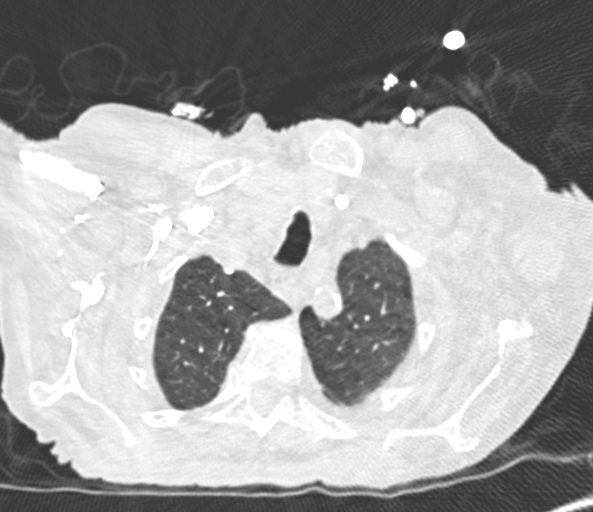
[im 214/224  soft-tissue]
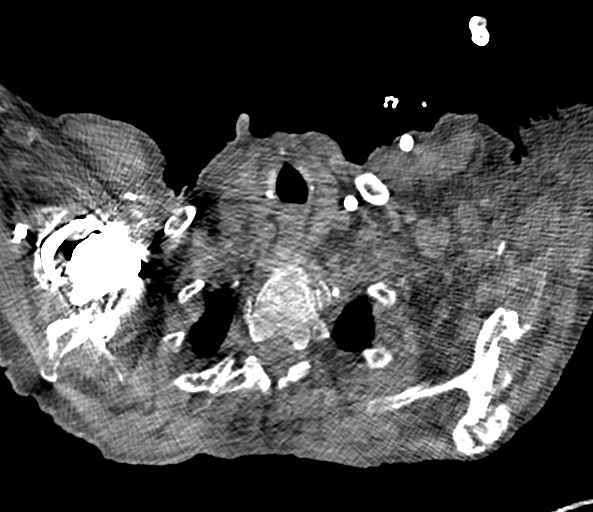

[Series 9: coronal mpr · coronal · 0.44mm/px · 3 of 140 slices shown]
[im 35/140  soft-tissue]
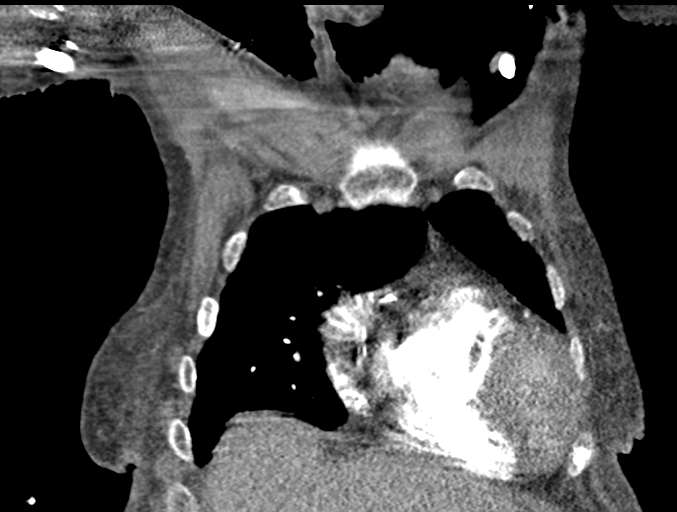
[im 70/140  soft-tissue]
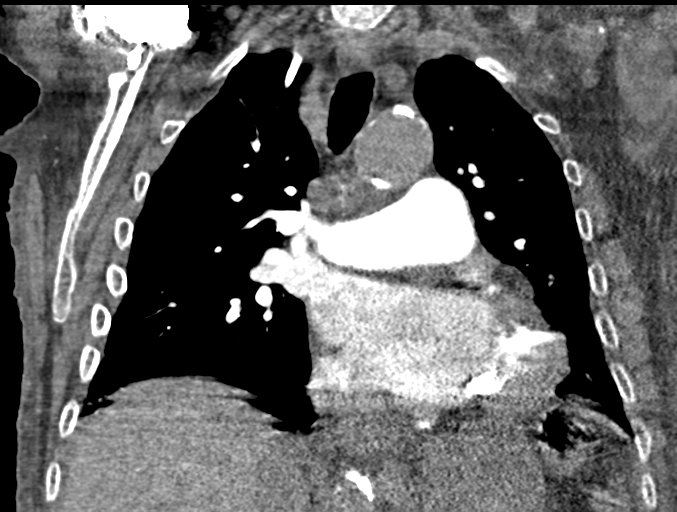
[im 105/140  soft-tissue]
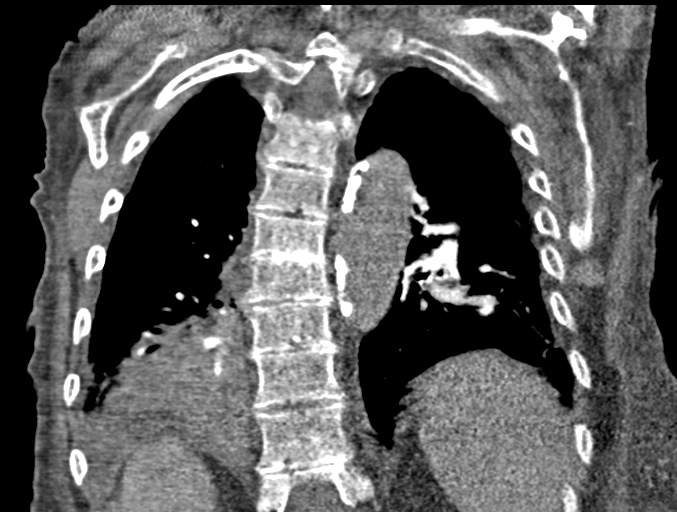

[15 of 46 positions shown; findings below may reference images not displayed]

FINDINGS: Cardiovascular: There is adequate opacification of the pulmonary
arterial tree. No intraluminal filling defect identified to suggest
acute pulmonary embolism. The central pulmonary arteries are
enlarged in keeping with pulmonary arterial hypertension.

Extensive multi-vessel coronary artery calcification. Moderate
cardiomegaly, stable since prior examination. No pericardial
effusion. Moderate atherosclerotic calcification within the thoracic
aorta. No aortic aneurysm. Left internal jugular hemodialysis
catheter tip noted within the superior right atrium.

Mediastinum/Nodes: Visualized thyroid is unremarkable. No pathologic
thoracic adenopathy. Stable soft tissue nodule within the anterior
mediastinum measuring 18 x 23 mm this is nonspecific, but may
represent a primary mass such as a thymoma or pathologically
enlarged lymph node. The esophagus is unremarkable.

Lungs/Pleura: There is dense consolidation of the posterior basal
segment of the right lower lobe with extensive airway impaction in
keeping with changes of acute lobar pneumonia or aspiration in the
appropriate clinical setting. The lungs are otherwise clear. No
pneumothorax or pleural effusion. Central airways are widely patent.

Upper Abdomen: No acute abnormality.

Musculoskeletal: No acute bone abnormality. Degenerative changes are
seen within the thoracic spine. Bilateral total shoulder
arthroplasty is partially visualized.

Review of the MIP images confirms the above findings.
IMPRESSION: No pulmonary embolism.

Dense consolidation of the posterior basal right lower lobe in
keeping with acute lobar pneumonia or aspiration. No central
obstructing lesion.

Extensive coronary artery calcification. Stable moderate
cardiomegaly.

Stable 23 mm soft tissue nodule within the anterior mediastinum.
Differential considerations are led by thymoma or pathologic lymph
node. This is unchanged in size and configuration, however, since
remote prior examination of 05/22/2017.

Aortic Atherosclerosis ([id number]).

## 2022-02-08 DIAGNOSIS — D631 Anemia in chronic kidney disease: Secondary | ICD-10-CM | POA: Diagnosis not present

## 2022-02-08 DIAGNOSIS — E039 Hypothyroidism, unspecified: Secondary | ICD-10-CM | POA: Diagnosis not present

## 2022-02-08 DIAGNOSIS — S72001A Fracture of unspecified part of neck of right femur, initial encounter for closed fracture: Secondary | ICD-10-CM | POA: Diagnosis not present

## 2022-02-08 DIAGNOSIS — F32A Depression, unspecified: Secondary | ICD-10-CM | POA: Diagnosis not present

## 2022-02-08 DIAGNOSIS — G4733 Obstructive sleep apnea (adult) (pediatric): Secondary | ICD-10-CM | POA: Diagnosis not present

## 2022-02-08 DIAGNOSIS — Z9981 Dependence on supplemental oxygen: Secondary | ICD-10-CM | POA: Diagnosis not present

## 2022-02-08 DIAGNOSIS — J9611 Chronic respiratory failure with hypoxia: Secondary | ICD-10-CM | POA: Diagnosis not present

## 2022-02-08 DIAGNOSIS — L219 Seborrheic dermatitis, unspecified: Secondary | ICD-10-CM | POA: Diagnosis not present

## 2022-02-08 DIAGNOSIS — Z79899 Other long term (current) drug therapy: Secondary | ICD-10-CM | POA: Diagnosis not present

## 2022-02-08 DIAGNOSIS — G2581 Restless legs syndrome: Secondary | ICD-10-CM | POA: Diagnosis not present

## 2022-02-08 DIAGNOSIS — K219 Gastro-esophageal reflux disease without esophagitis: Secondary | ICD-10-CM | POA: Diagnosis not present

## 2022-02-08 DIAGNOSIS — I482 Chronic atrial fibrillation, unspecified: Secondary | ICD-10-CM | POA: Diagnosis not present

## 2022-02-08 DIAGNOSIS — J449 Chronic obstructive pulmonary disease, unspecified: Secondary | ICD-10-CM | POA: Diagnosis not present

## 2022-02-08 DIAGNOSIS — Z992 Dependence on renal dialysis: Secondary | ICD-10-CM | POA: Diagnosis not present

## 2022-02-08 DIAGNOSIS — D649 Anemia, unspecified: Secondary | ICD-10-CM | POA: Diagnosis not present

## 2022-02-08 DIAGNOSIS — Z96641 Presence of right artificial hip joint: Secondary | ICD-10-CM | POA: Diagnosis not present

## 2022-02-08 DIAGNOSIS — W19XXXD Unspecified fall, subsequent encounter: Secondary | ICD-10-CM | POA: Diagnosis not present

## 2022-02-08 DIAGNOSIS — E1122 Type 2 diabetes mellitus with diabetic chronic kidney disease: Secondary | ICD-10-CM | POA: Diagnosis not present

## 2022-02-08 DIAGNOSIS — M1990 Unspecified osteoarthritis, unspecified site: Secondary | ICD-10-CM | POA: Diagnosis not present

## 2022-02-08 DIAGNOSIS — N2581 Secondary hyperparathyroidism of renal origin: Secondary | ICD-10-CM | POA: Diagnosis not present

## 2022-02-08 DIAGNOSIS — M6281 Muscle weakness (generalized): Secondary | ICD-10-CM | POA: Diagnosis not present

## 2022-02-08 DIAGNOSIS — E785 Hyperlipidemia, unspecified: Secondary | ICD-10-CM | POA: Diagnosis not present

## 2022-02-08 DIAGNOSIS — N186 End stage renal disease: Secondary | ICD-10-CM | POA: Diagnosis not present

## 2022-02-08 DIAGNOSIS — I480 Paroxysmal atrial fibrillation: Secondary | ICD-10-CM | POA: Diagnosis not present

## 2022-02-08 DIAGNOSIS — I120 Hypertensive chronic kidney disease with stage 5 chronic kidney disease or end stage renal disease: Secondary | ICD-10-CM | POA: Diagnosis not present

## 2022-02-08 DIAGNOSIS — M25521 Pain in right elbow: Secondary | ICD-10-CM | POA: Diagnosis not present

## 2022-02-08 DIAGNOSIS — D62 Acute posthemorrhagic anemia: Secondary | ICD-10-CM | POA: Diagnosis not present

## 2022-02-08 DIAGNOSIS — Z87891 Personal history of nicotine dependence: Secondary | ICD-10-CM | POA: Diagnosis not present

## 2022-02-08 DIAGNOSIS — J431 Panlobular emphysema: Secondary | ICD-10-CM | POA: Diagnosis not present

## 2022-02-08 DIAGNOSIS — S72001D Fracture of unspecified part of neck of right femur, subsequent encounter for closed fracture with routine healing: Secondary | ICD-10-CM | POA: Diagnosis not present

## 2022-02-09 DIAGNOSIS — E1122 Type 2 diabetes mellitus with diabetic chronic kidney disease: Secondary | ICD-10-CM | POA: Diagnosis not present

## 2022-02-09 DIAGNOSIS — N2581 Secondary hyperparathyroidism of renal origin: Secondary | ICD-10-CM | POA: Diagnosis not present

## 2022-02-09 DIAGNOSIS — Z96641 Presence of right artificial hip joint: Secondary | ICD-10-CM | POA: Diagnosis not present

## 2022-02-09 DIAGNOSIS — S72001D Fracture of unspecified part of neck of right femur, subsequent encounter for closed fracture with routine healing: Secondary | ICD-10-CM | POA: Diagnosis not present

## 2022-02-09 DIAGNOSIS — G4733 Obstructive sleep apnea (adult) (pediatric): Secondary | ICD-10-CM | POA: Diagnosis not present

## 2022-02-09 DIAGNOSIS — N186 End stage renal disease: Secondary | ICD-10-CM | POA: Diagnosis not present

## 2022-02-09 DIAGNOSIS — K219 Gastro-esophageal reflux disease without esophagitis: Secondary | ICD-10-CM | POA: Diagnosis not present

## 2022-02-09 DIAGNOSIS — J431 Panlobular emphysema: Secondary | ICD-10-CM | POA: Diagnosis not present

## 2022-02-09 DIAGNOSIS — Z992 Dependence on renal dialysis: Secondary | ICD-10-CM | POA: Diagnosis not present

## 2022-02-09 DIAGNOSIS — I482 Chronic atrial fibrillation, unspecified: Secondary | ICD-10-CM | POA: Diagnosis not present

## 2022-02-09 DIAGNOSIS — M6281 Muscle weakness (generalized): Secondary | ICD-10-CM | POA: Diagnosis not present

## 2022-02-09 DIAGNOSIS — Z9981 Dependence on supplemental oxygen: Secondary | ICD-10-CM | POA: Diagnosis not present

## 2022-02-09 DIAGNOSIS — E039 Hypothyroidism, unspecified: Secondary | ICD-10-CM | POA: Diagnosis not present

## 2022-02-10 IMAGING — XA IR FLUORO GUIDE CV LINE*R*
1 series · 1 of 1 positions shown · non-contrast
Comparison: none

INDICATION: End-stage renal disease, concern for catheter in infection, sepsis

[Series 2: fl (-) angio · 1 of 1 slices shown]
[im 1/1]
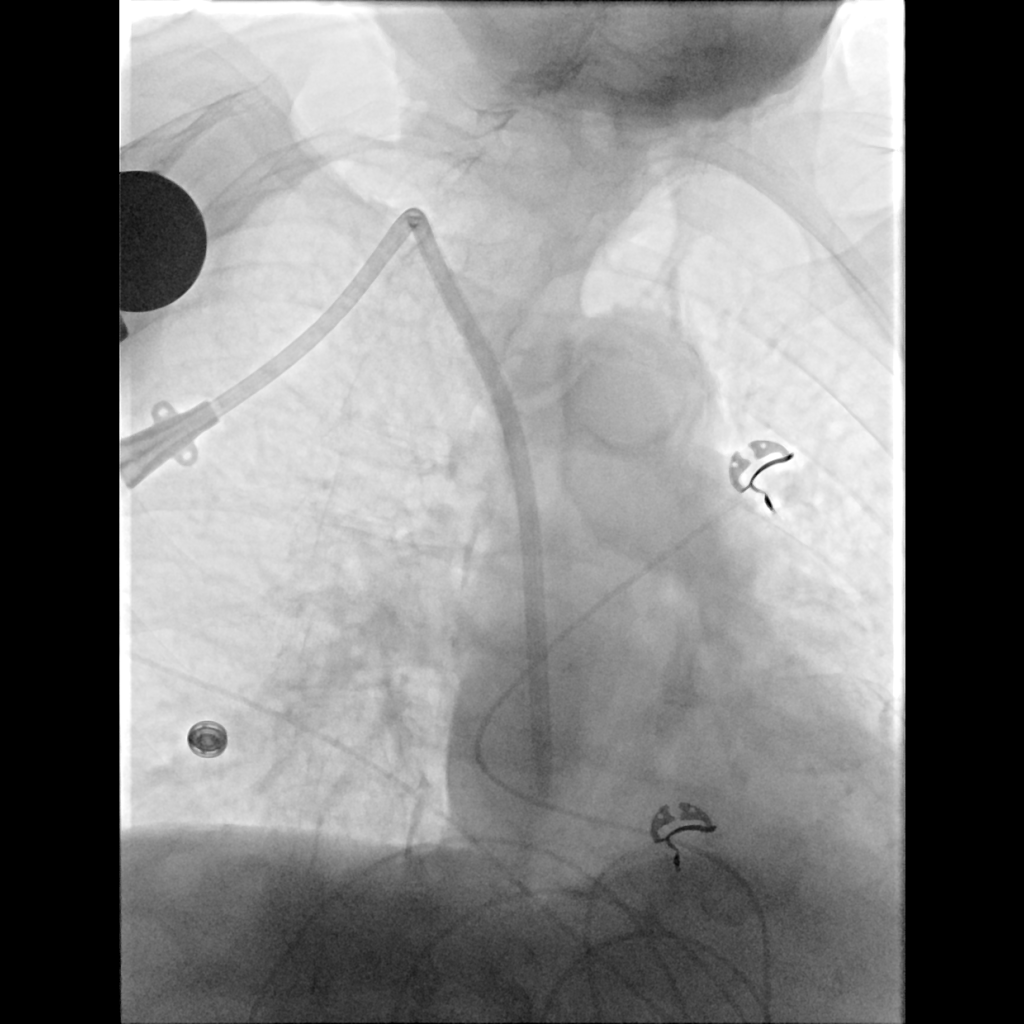

[1 of 1 positions shown; findings below may reference images not displayed]

EXAM:
ULTRASOUND GUIDANCE FOR VASCULAR ACCESS

RIGHT INTERNAL JUGULAR PERMANENT HEMODIALYSIS CATHETER

Radiologist:  Moodie, Nimra

Guidance:  ULTRASOUND AND FLUOROSCOPIC

FLUOROSCOPY TIME:  Fluoroscopy Time: 1 minutes 6 seconds (3 mGy).

MEDICATIONS:
1% lidocaine local

ANESTHESIA/SEDATION:
Versed 0.5 mg IV; Fentanyl 25 mcg IV;

Moderate Sedation Time:  20 minute

The patient was continuously monitored during the procedure by the
interventional radiology nurse under my direct supervision.

CONTRAST:  None.

COMPLICATIONS:
None immediate.

PROCEDURE:
Informed consent was obtained from the patient following explanation
of the procedure, risks, benefits and alternatives. The patient
understands, agrees and consents for the procedure. All questions
were addressed. A time out was performed.

Maximal barrier sterile technique utilized including caps, mask,
sterile gowns, sterile gloves, large sterile drape, hand hygiene,
and 2% chlorhexidine scrub.

Under sterile conditions and local anesthesia, right internal
jugular micropuncture venous access was performed with ultrasound.
Images were obtained for documentation of the patent right internal
jugular vein. A guide wire was inserted followed by a transitional
dilator. Next, a 0.035 guidewire was advanced into the IVC with a
5-French catheter. Measurements were obtained from the right
venotomy site to the proximal right atrium. In the right
infraclavicular chest, a subcutaneous tunnel was created under
sterile conditions and local anesthesia. 1% lidocaine with
epinephrine was utilized for this. The 19 cm tip to cuff palindrome
catheter was tunneled subcutaneously to the venotomy site and
inserted into the SVC/RA junction through a valved peel-away sheath.
Position was confirmed with fluoroscopy. Images were obtained for
documentation. Blood was aspirated from the catheter followed by
saline and heparin flushes. The appropriate volume and strength of
heparin was instilled in each lumen. Caps were applied. The catheter
was secured at the tunnel site with Gelfoam and a pursestring
suture. The venotomy site was closed with subcuticular Vicryl
suture. Dermabond was applied to the small right neck incision. A
dry sterile dressing was applied. The catheter is ready for use. No
immediate complications.
IMPRESSION: Ultrasound and fluoroscopically guided right internal jugular
tunneled hemodialysis catheter (19 cm tip to cuff palindrome
catheter).

## 2022-02-11 DIAGNOSIS — N2581 Secondary hyperparathyroidism of renal origin: Secondary | ICD-10-CM | POA: Diagnosis not present

## 2022-02-11 DIAGNOSIS — Z992 Dependence on renal dialysis: Secondary | ICD-10-CM | POA: Diagnosis not present

## 2022-02-11 DIAGNOSIS — N186 End stage renal disease: Secondary | ICD-10-CM | POA: Diagnosis not present

## 2022-02-14 DIAGNOSIS — D649 Anemia, unspecified: Secondary | ICD-10-CM | POA: Diagnosis not present

## 2022-02-14 DIAGNOSIS — N186 End stage renal disease: Secondary | ICD-10-CM | POA: Diagnosis not present

## 2022-02-14 DIAGNOSIS — N2581 Secondary hyperparathyroidism of renal origin: Secondary | ICD-10-CM | POA: Diagnosis not present

## 2022-02-14 DIAGNOSIS — E039 Hypothyroidism, unspecified: Secondary | ICD-10-CM | POA: Diagnosis not present

## 2022-02-14 DIAGNOSIS — Z992 Dependence on renal dialysis: Secondary | ICD-10-CM | POA: Diagnosis not present

## 2022-02-15 ENCOUNTER — Ambulatory Visit: Payer: Medicare Other

## 2022-02-16 DIAGNOSIS — N2581 Secondary hyperparathyroidism of renal origin: Secondary | ICD-10-CM | POA: Diagnosis not present

## 2022-02-16 DIAGNOSIS — Z992 Dependence on renal dialysis: Secondary | ICD-10-CM | POA: Diagnosis not present

## 2022-02-16 DIAGNOSIS — N186 End stage renal disease: Secondary | ICD-10-CM | POA: Diagnosis not present

## 2022-02-17 DIAGNOSIS — S72001A Fracture of unspecified part of neck of right femur, initial encounter for closed fracture: Secondary | ICD-10-CM | POA: Diagnosis not present

## 2022-02-18 DIAGNOSIS — N2581 Secondary hyperparathyroidism of renal origin: Secondary | ICD-10-CM | POA: Diagnosis not present

## 2022-02-18 DIAGNOSIS — N186 End stage renal disease: Secondary | ICD-10-CM | POA: Diagnosis not present

## 2022-02-18 DIAGNOSIS — Z992 Dependence on renal dialysis: Secondary | ICD-10-CM | POA: Diagnosis not present

## 2022-02-20 DIAGNOSIS — Z992 Dependence on renal dialysis: Secondary | ICD-10-CM | POA: Diagnosis not present

## 2022-02-20 DIAGNOSIS — N186 End stage renal disease: Secondary | ICD-10-CM | POA: Diagnosis not present

## 2022-02-20 DIAGNOSIS — N2581 Secondary hyperparathyroidism of renal origin: Secondary | ICD-10-CM | POA: Diagnosis not present

## 2022-02-21 ENCOUNTER — Other Ambulatory Visit: Payer: Self-pay | Admitting: Family Medicine

## 2022-02-21 DIAGNOSIS — J449 Chronic obstructive pulmonary disease, unspecified: Secondary | ICD-10-CM

## 2022-02-21 DIAGNOSIS — I10 Essential (primary) hypertension: Secondary | ICD-10-CM

## 2022-02-22 DIAGNOSIS — Z992 Dependence on renal dialysis: Secondary | ICD-10-CM | POA: Diagnosis not present

## 2022-02-22 DIAGNOSIS — S72001D Fracture of unspecified part of neck of right femur, subsequent encounter for closed fracture with routine healing: Secondary | ICD-10-CM | POA: Diagnosis not present

## 2022-02-22 DIAGNOSIS — Z79899 Other long term (current) drug therapy: Secondary | ICD-10-CM | POA: Diagnosis not present

## 2022-02-22 DIAGNOSIS — E1122 Type 2 diabetes mellitus with diabetic chronic kidney disease: Secondary | ICD-10-CM | POA: Diagnosis not present

## 2022-02-22 DIAGNOSIS — N186 End stage renal disease: Secondary | ICD-10-CM | POA: Diagnosis not present

## 2022-02-22 DIAGNOSIS — N2581 Secondary hyperparathyroidism of renal origin: Secondary | ICD-10-CM | POA: Diagnosis not present

## 2022-02-25 DIAGNOSIS — Z992 Dependence on renal dialysis: Secondary | ICD-10-CM | POA: Diagnosis not present

## 2022-02-25 DIAGNOSIS — N186 End stage renal disease: Secondary | ICD-10-CM | POA: Diagnosis not present

## 2022-02-25 DIAGNOSIS — N2581 Secondary hyperparathyroidism of renal origin: Secondary | ICD-10-CM | POA: Diagnosis not present

## 2022-02-28 DIAGNOSIS — N2581 Secondary hyperparathyroidism of renal origin: Secondary | ICD-10-CM | POA: Diagnosis not present

## 2022-02-28 DIAGNOSIS — Z992 Dependence on renal dialysis: Secondary | ICD-10-CM | POA: Diagnosis not present

## 2022-02-28 DIAGNOSIS — N186 End stage renal disease: Secondary | ICD-10-CM | POA: Diagnosis not present

## 2022-03-01 ENCOUNTER — Encounter: Payer: Self-pay | Admitting: Nurse Practitioner

## 2022-03-01 ENCOUNTER — Ambulatory Visit: Payer: Medicare Other | Admitting: Nurse Practitioner

## 2022-03-02 DIAGNOSIS — Z992 Dependence on renal dialysis: Secondary | ICD-10-CM | POA: Diagnosis not present

## 2022-03-02 DIAGNOSIS — N186 End stage renal disease: Secondary | ICD-10-CM | POA: Diagnosis not present

## 2022-03-02 DIAGNOSIS — N2581 Secondary hyperparathyroidism of renal origin: Secondary | ICD-10-CM | POA: Diagnosis not present

## 2022-03-03 DIAGNOSIS — G4733 Obstructive sleep apnea (adult) (pediatric): Secondary | ICD-10-CM | POA: Diagnosis not present

## 2022-03-03 DIAGNOSIS — N186 End stage renal disease: Secondary | ICD-10-CM | POA: Diagnosis not present

## 2022-03-03 DIAGNOSIS — E1122 Type 2 diabetes mellitus with diabetic chronic kidney disease: Secondary | ICD-10-CM | POA: Diagnosis not present

## 2022-03-03 DIAGNOSIS — J431 Panlobular emphysema: Secondary | ICD-10-CM | POA: Diagnosis not present

## 2022-03-03 DIAGNOSIS — I482 Chronic atrial fibrillation, unspecified: Secondary | ICD-10-CM | POA: Diagnosis not present

## 2022-03-03 DIAGNOSIS — S72001D Fracture of unspecified part of neck of right femur, subsequent encounter for closed fracture with routine healing: Secondary | ICD-10-CM | POA: Diagnosis not present

## 2022-03-03 DIAGNOSIS — E039 Hypothyroidism, unspecified: Secondary | ICD-10-CM | POA: Diagnosis not present

## 2022-03-03 DIAGNOSIS — Z992 Dependence on renal dialysis: Secondary | ICD-10-CM | POA: Diagnosis not present

## 2022-03-03 DIAGNOSIS — Z96641 Presence of right artificial hip joint: Secondary | ICD-10-CM | POA: Diagnosis not present

## 2022-03-03 DIAGNOSIS — K219 Gastro-esophageal reflux disease without esophagitis: Secondary | ICD-10-CM | POA: Diagnosis not present

## 2022-03-03 DIAGNOSIS — M6281 Muscle weakness (generalized): Secondary | ICD-10-CM | POA: Diagnosis not present

## 2022-03-03 DIAGNOSIS — Z9981 Dependence on supplemental oxygen: Secondary | ICD-10-CM | POA: Diagnosis not present

## 2022-03-04 DIAGNOSIS — Z992 Dependence on renal dialysis: Secondary | ICD-10-CM | POA: Diagnosis not present

## 2022-03-04 DIAGNOSIS — N2581 Secondary hyperparathyroidism of renal origin: Secondary | ICD-10-CM | POA: Diagnosis not present

## 2022-03-04 DIAGNOSIS — N186 End stage renal disease: Secondary | ICD-10-CM | POA: Diagnosis not present

## 2022-03-05 DIAGNOSIS — M25521 Pain in right elbow: Secondary | ICD-10-CM | POA: Diagnosis not present

## 2022-03-07 DIAGNOSIS — N186 End stage renal disease: Secondary | ICD-10-CM | POA: Diagnosis not present

## 2022-03-07 DIAGNOSIS — D631 Anemia in chronic kidney disease: Secondary | ICD-10-CM | POA: Diagnosis not present

## 2022-03-07 DIAGNOSIS — D689 Coagulation defect, unspecified: Secondary | ICD-10-CM | POA: Diagnosis not present

## 2022-03-07 DIAGNOSIS — Z992 Dependence on renal dialysis: Secondary | ICD-10-CM | POA: Diagnosis not present

## 2022-03-07 DIAGNOSIS — Z4901 Encounter for fitting and adjustment of extracorporeal dialysis catheter: Secondary | ICD-10-CM | POA: Diagnosis not present

## 2022-03-07 DIAGNOSIS — N2581 Secondary hyperparathyroidism of renal origin: Secondary | ICD-10-CM | POA: Diagnosis not present

## 2022-03-07 DIAGNOSIS — Z23 Encounter for immunization: Secondary | ICD-10-CM | POA: Diagnosis not present

## 2022-03-08 ENCOUNTER — Encounter: Payer: Self-pay | Admitting: Nurse Practitioner

## 2022-03-08 ENCOUNTER — Ambulatory Visit (INDEPENDENT_AMBULATORY_CARE_PROVIDER_SITE_OTHER): Payer: Medicare Other | Admitting: Nurse Practitioner

## 2022-03-08 VITALS — HR 88 | Temp 98.6°F | Ht 65.0 in | Wt 110.0 lb

## 2022-03-08 DIAGNOSIS — M25551 Pain in right hip: Secondary | ICD-10-CM

## 2022-03-08 DIAGNOSIS — G2581 Restless legs syndrome: Secondary | ICD-10-CM | POA: Diagnosis not present

## 2022-03-08 DIAGNOSIS — R6889 Other general symptoms and signs: Secondary | ICD-10-CM | POA: Diagnosis not present

## 2022-03-08 NOTE — Progress Notes (Signed)
Acute Office Visit  Subjective:     Patient ID: [name], female    DOB: 1940/11/20, 81 y.o.   MRN: [id number]  Chief Complaint  Patient presents with   Hospitalization Follow-up    Hip Pain  The incident occurred 6 to 12 hours ago. The incident occurred in the street. The injury mechanism was a fall. The pain is present in the right hip. The pain is at a severity of 6/10. The pain is moderate. The pain has been Constant since onset. Associated symptoms include an inability to bear weight. Pertinent negatives include no loss of motion, numbness or tingling. She reports no foreign bodies present. The symptoms are aggravated by movement and palpation. She has tried acetaminophen for the symptoms. The treatment provided no relief.   Patient presents with symptoms of restless leg syndrome.  Symptoms been present for the past few weeks per caregiver.  Patient reports her legs jump at night and cannot keep still.  She denies fever, flulike symptoms, or any kind of trauma in the past few weeks.  Review of Systems  Constitutional: Negative.   HENT: Negative.    Respiratory: Negative.    Cardiovascular: Negative.   Genitourinary: Negative.   Musculoskeletal:  Positive for joint pain.  Skin: Negative.   Neurological:  Negative for tingling and numbness.  All other systems reviewed and are negative.       Objective:    Pulse 88   Temp 98.6 F (37 C)   Ht '5\' 5"'$  (1.651 m)   Wt 110 lb (49.9 kg)   SpO2 99%   BMI 18.30 kg/m  BP Readings from Last 3 Encounters:  01/27/22 (!) 141/72  11/25/21 (!) 151/90  08/24/21 (!) 143/80   Wt Readings from Last 3 Encounters:  03/08/22 110 lb (49.9 kg)  01/27/22 113 lb (51.3 kg)  04/28/21 110 lb (49.9 kg)      Physical Exam Vitals and nursing note reviewed.  Constitutional:      Appearance: Normal appearance.  HENT:     Head: Normocephalic.     Right Ear: External ear normal.     Left Ear: External ear normal.     Nose: Nose normal.      Mouth/Throat:     Mouth: Mucous membranes are moist.     Pharynx: Oropharynx is clear.  Eyes:     Pupils: Pupils are equal, round, and reactive to light.  Cardiovascular:     Rate and Rhythm: Normal rate and regular rhythm.     Pulses: Normal pulses.     Heart sounds: Normal heart sounds.  Pulmonary:     Effort: Pulmonary effort is normal.     Breath sounds: Normal breath sounds.  Abdominal:     General: Bowel sounds are normal.  Musculoskeletal:     Right hip: Tenderness present. Decreased range of motion.  Skin:    General: Skin is warm.     Findings: No erythema or rash.  Neurological:     Mental Status: She is alert and oriented to person, place, and time.     No results found for any visits on 03/08/22.      Assessment & Plan:  Right hip pain not well-controlled in the past last few days.  Advised patient to rest, apply warm compress, Tylenol as needed for pain.  Completed hip x-ray results pending.  Completed referral to Occupational Therapy.  Follow-up with worsening unresolved symptoms.   Completed iron and ferritin levels results pending.  Will treat patient's restless leg after lab resolution.  Problem List Items Addressed This Visit       Other   Restless leg syndrome   Relevant Orders   Iron, TIBC and Ferritin Panel   Other Visit Diagnoses     Right hip pain    -  Primary       No orders of the defined types were placed in this encounter.   Return if symptoms worsen or fail to improve.  Ivy Lynn, NP

## 2022-03-08 NOTE — Patient Instructions (Signed)
Hip Pain The hip is the joint between the upper legs and the lower pelvis. The bones, cartilage, tendons, and muscles of your hip joint support your body and allow you to move around. Hip pain can range from a minor ache to severe pain in one or both of your hips. The pain may be felt on the inside of the hip joint near the groin, or on the outside near the buttocks and upper thigh. You may also have swelling or stiffness in your hip area. Follow these instructions at home: Managing pain, stiffness, and swelling     If directed, put ice on the painful area. To do this: Put ice in a plastic bag. Place a towel between your skin and the bag. Leave the ice on for 20 minutes, 2-3 times a day. If directed, apply heat to the affected area as often as told by your health care provider. Use the heat source that your health care provider recommends, such as a moist heat pack or a heating pad. Place a towel between your skin and the heat source. Leave the heat on for 20-30 minutes. Remove the heat if your skin turns bright red. This is especially important if you are unable to feel pain, heat, or cold. You may have a greater risk of getting burned. Activity Do exercises as told by your health care provider. Avoid activities that cause pain. General instructions  Take over-the-counter and prescription medicines only as told by your health care provider. Keep a journal of your symptoms. Write down: How often you have hip pain. The location of your pain. What the pain feels like. What makes the pain worse. Sleep with a pillow between your legs on your most comfortable side. Keep all follow-up visits as told by your health care provider. This is important. Contact a health care provider if: You cannot put weight on your leg. Your pain or swelling continues or gets worse after one week. It gets harder to walk. You have a fever. Get help right away if: You fall. You have a sudden increase in pain  and swelling in your hip. Your hip is red or swollen or very tender to touch. Summary Hip pain can range from a minor ache to severe pain in one or both of your hips. The pain may be felt on the inside of the hip joint near the groin, or on the outside near the buttocks and upper thigh. Avoid activities that cause pain. Write down how often you have hip pain, the location of the pain, what makes it worse, and what it feels like. This information is not intended to replace advice given to you by your health care provider. Make sure you discuss any questions you have with your health care provider. Document Revised: 08/06/2018 Document Reviewed: 08/06/2018 [employer] Patient Education  2023 [employer]  

## 2022-03-09 ENCOUNTER — Other Ambulatory Visit: Payer: Self-pay | Admitting: Nurse Practitioner

## 2022-03-09 DIAGNOSIS — D631 Anemia in chronic kidney disease: Secondary | ICD-10-CM | POA: Diagnosis not present

## 2022-03-09 DIAGNOSIS — N2581 Secondary hyperparathyroidism of renal origin: Secondary | ICD-10-CM | POA: Diagnosis not present

## 2022-03-09 DIAGNOSIS — N186 End stage renal disease: Secondary | ICD-10-CM | POA: Diagnosis not present

## 2022-03-09 DIAGNOSIS — Z992 Dependence on renal dialysis: Secondary | ICD-10-CM | POA: Diagnosis not present

## 2022-03-09 DIAGNOSIS — D689 Coagulation defect, unspecified: Secondary | ICD-10-CM | POA: Diagnosis not present

## 2022-03-09 DIAGNOSIS — Z4901 Encounter for fitting and adjustment of extracorporeal dialysis catheter: Secondary | ICD-10-CM | POA: Diagnosis not present

## 2022-03-09 DIAGNOSIS — Z23 Encounter for immunization: Secondary | ICD-10-CM | POA: Diagnosis not present

## 2022-03-09 LAB — IRON,TIBC AND FERRITIN PANEL
Ferritin: 2800 ng/mL — ABNORMAL HIGH (ref 15–150)
Iron Saturation: >86 % (ref 15–55)
Iron: 104 ug/dL (ref 27–139)
Total Iron Binding Capacity: <121 ug/dL — CL (ref 250–450)
UIBC: <17 ug/dL — ABNORMAL LOW (ref 118–369)

## 2022-03-10 ENCOUNTER — Other Ambulatory Visit: Payer: Self-pay | Admitting: Nurse Practitioner

## 2022-03-10 ENCOUNTER — Telehealth: Payer: Self-pay | Admitting: Nurse Practitioner

## 2022-03-10 DIAGNOSIS — M25551 Pain in right hip: Secondary | ICD-10-CM

## 2022-03-10 DIAGNOSIS — D508 Other iron deficiency anemias: Secondary | ICD-10-CM

## 2022-03-10 NOTE — Telephone Encounter (Signed)
Spoke with daughter, she reports [name] was going to discuss home health options with [facility].

## 2022-03-10 NOTE — Telephone Encounter (Signed)
RETURNED CALL, NO ANSWER, LEFT MESSAGE TO RETURN CALL

## 2022-03-10 NOTE — Telephone Encounter (Signed)
Patient's daughter calling to check on home health. Please call back.

## 2022-03-11 ENCOUNTER — Other Ambulatory Visit: Payer: Self-pay | Admitting: Nurse Practitioner

## 2022-03-11 DIAGNOSIS — N186 End stage renal disease: Secondary | ICD-10-CM | POA: Diagnosis not present

## 2022-03-11 DIAGNOSIS — N2581 Secondary hyperparathyroidism of renal origin: Secondary | ICD-10-CM | POA: Diagnosis not present

## 2022-03-11 DIAGNOSIS — D689 Coagulation defect, unspecified: Secondary | ICD-10-CM | POA: Diagnosis not present

## 2022-03-11 DIAGNOSIS — Z992 Dependence on renal dialysis: Secondary | ICD-10-CM | POA: Diagnosis not present

## 2022-03-11 DIAGNOSIS — Z23 Encounter for immunization: Secondary | ICD-10-CM | POA: Diagnosis not present

## 2022-03-11 DIAGNOSIS — M25551 Pain in right hip: Secondary | ICD-10-CM

## 2022-03-11 DIAGNOSIS — D631 Anemia in chronic kidney disease: Secondary | ICD-10-CM | POA: Diagnosis not present

## 2022-03-11 DIAGNOSIS — Z4901 Encounter for fitting and adjustment of extracorporeal dialysis catheter: Secondary | ICD-10-CM | POA: Diagnosis not present

## 2022-03-14 DIAGNOSIS — Z4901 Encounter for fitting and adjustment of extracorporeal dialysis catheter: Secondary | ICD-10-CM | POA: Diagnosis not present

## 2022-03-14 DIAGNOSIS — Z992 Dependence on renal dialysis: Secondary | ICD-10-CM | POA: Diagnosis not present

## 2022-03-14 DIAGNOSIS — Z23 Encounter for immunization: Secondary | ICD-10-CM | POA: Diagnosis not present

## 2022-03-14 DIAGNOSIS — D631 Anemia in chronic kidney disease: Secondary | ICD-10-CM | POA: Diagnosis not present

## 2022-03-14 DIAGNOSIS — D689 Coagulation defect, unspecified: Secondary | ICD-10-CM | POA: Diagnosis not present

## 2022-03-14 DIAGNOSIS — N186 End stage renal disease: Secondary | ICD-10-CM | POA: Diagnosis not present

## 2022-03-14 DIAGNOSIS — N2581 Secondary hyperparathyroidism of renal origin: Secondary | ICD-10-CM | POA: Diagnosis not present

## 2022-03-15 DIAGNOSIS — M66821 Spontaneous rupture of other tendons, right upper arm: Secondary | ICD-10-CM | POA: Diagnosis not present

## 2022-03-15 DIAGNOSIS — M25521 Pain in right elbow: Secondary | ICD-10-CM | POA: Diagnosis not present

## 2022-03-16 DIAGNOSIS — Z23 Encounter for immunization: Secondary | ICD-10-CM | POA: Diagnosis not present

## 2022-03-16 DIAGNOSIS — Z992 Dependence on renal dialysis: Secondary | ICD-10-CM | POA: Diagnosis not present

## 2022-03-16 DIAGNOSIS — N2581 Secondary hyperparathyroidism of renal origin: Secondary | ICD-10-CM | POA: Diagnosis not present

## 2022-03-16 DIAGNOSIS — D689 Coagulation defect, unspecified: Secondary | ICD-10-CM | POA: Diagnosis not present

## 2022-03-16 DIAGNOSIS — D631 Anemia in chronic kidney disease: Secondary | ICD-10-CM | POA: Diagnosis not present

## 2022-03-16 DIAGNOSIS — Z4901 Encounter for fitting and adjustment of extracorporeal dialysis catheter: Secondary | ICD-10-CM | POA: Diagnosis not present

## 2022-03-16 DIAGNOSIS — N186 End stage renal disease: Secondary | ICD-10-CM | POA: Diagnosis not present

## 2022-03-18 DIAGNOSIS — D631 Anemia in chronic kidney disease: Secondary | ICD-10-CM | POA: Diagnosis not present

## 2022-03-18 DIAGNOSIS — Z992 Dependence on renal dialysis: Secondary | ICD-10-CM | POA: Diagnosis not present

## 2022-03-18 DIAGNOSIS — Z4901 Encounter for fitting and adjustment of extracorporeal dialysis catheter: Secondary | ICD-10-CM | POA: Diagnosis not present

## 2022-03-18 DIAGNOSIS — N2581 Secondary hyperparathyroidism of renal origin: Secondary | ICD-10-CM | POA: Diagnosis not present

## 2022-03-18 DIAGNOSIS — D689 Coagulation defect, unspecified: Secondary | ICD-10-CM | POA: Diagnosis not present

## 2022-03-18 DIAGNOSIS — Z23 Encounter for immunization: Secondary | ICD-10-CM | POA: Diagnosis not present

## 2022-03-18 DIAGNOSIS — N186 End stage renal disease: Secondary | ICD-10-CM | POA: Diagnosis not present

## 2022-03-21 ENCOUNTER — Ambulatory Visit (INDEPENDENT_AMBULATORY_CARE_PROVIDER_SITE_OTHER): Payer: Medicare Other

## 2022-03-21 DIAGNOSIS — Z23 Encounter for immunization: Secondary | ICD-10-CM | POA: Diagnosis not present

## 2022-03-21 DIAGNOSIS — M8000XD Age-related osteoporosis with current pathological fracture, unspecified site, subsequent encounter for fracture with routine healing: Secondary | ICD-10-CM

## 2022-03-21 DIAGNOSIS — Z992 Dependence on renal dialysis: Secondary | ICD-10-CM | POA: Diagnosis not present

## 2022-03-21 DIAGNOSIS — N2581 Secondary hyperparathyroidism of renal origin: Secondary | ICD-10-CM | POA: Diagnosis not present

## 2022-03-21 DIAGNOSIS — D631 Anemia in chronic kidney disease: Secondary | ICD-10-CM | POA: Diagnosis not present

## 2022-03-21 DIAGNOSIS — N186 End stage renal disease: Secondary | ICD-10-CM | POA: Diagnosis not present

## 2022-03-21 DIAGNOSIS — M810 Age-related osteoporosis without current pathological fracture: Secondary | ICD-10-CM | POA: Diagnosis not present

## 2022-03-21 DIAGNOSIS — D689 Coagulation defect, unspecified: Secondary | ICD-10-CM | POA: Diagnosis not present

## 2022-03-21 DIAGNOSIS — Z4901 Encounter for fitting and adjustment of extracorporeal dialysis catheter: Secondary | ICD-10-CM | POA: Diagnosis not present

## 2022-03-21 MED ORDER — ROMOSOZUMAB-AQQG 105 MG/1.17ML ~~LOC~~ SOSY
210.0000 mg | PREFILLED_SYRINGE | Freq: Once | SUBCUTANEOUS | Status: AC
  Administered 2022-03-21: 210 mg via SUBCUTANEOUS

## 2022-03-23 DIAGNOSIS — Z23 Encounter for immunization: Secondary | ICD-10-CM | POA: Diagnosis not present

## 2022-03-23 DIAGNOSIS — N2581 Secondary hyperparathyroidism of renal origin: Secondary | ICD-10-CM | POA: Diagnosis not present

## 2022-03-23 DIAGNOSIS — D631 Anemia in chronic kidney disease: Secondary | ICD-10-CM | POA: Diagnosis not present

## 2022-03-23 DIAGNOSIS — Z4901 Encounter for fitting and adjustment of extracorporeal dialysis catheter: Secondary | ICD-10-CM | POA: Diagnosis not present

## 2022-03-23 DIAGNOSIS — D689 Coagulation defect, unspecified: Secondary | ICD-10-CM | POA: Diagnosis not present

## 2022-03-23 DIAGNOSIS — N186 End stage renal disease: Secondary | ICD-10-CM | POA: Diagnosis not present

## 2022-03-23 DIAGNOSIS — Z992 Dependence on renal dialysis: Secondary | ICD-10-CM | POA: Diagnosis not present

## 2022-03-25 DIAGNOSIS — D631 Anemia in chronic kidney disease: Secondary | ICD-10-CM | POA: Diagnosis not present

## 2022-03-25 DIAGNOSIS — N186 End stage renal disease: Secondary | ICD-10-CM | POA: Diagnosis not present

## 2022-03-25 DIAGNOSIS — D689 Coagulation defect, unspecified: Secondary | ICD-10-CM | POA: Diagnosis not present

## 2022-03-25 DIAGNOSIS — Z4901 Encounter for fitting and adjustment of extracorporeal dialysis catheter: Secondary | ICD-10-CM | POA: Diagnosis not present

## 2022-03-25 DIAGNOSIS — Z23 Encounter for immunization: Secondary | ICD-10-CM | POA: Diagnosis not present

## 2022-03-25 DIAGNOSIS — N2581 Secondary hyperparathyroidism of renal origin: Secondary | ICD-10-CM | POA: Diagnosis not present

## 2022-03-25 DIAGNOSIS — Z992 Dependence on renal dialysis: Secondary | ICD-10-CM | POA: Diagnosis not present

## 2022-03-27 DIAGNOSIS — D631 Anemia in chronic kidney disease: Secondary | ICD-10-CM | POA: Diagnosis not present

## 2022-03-27 DIAGNOSIS — N2581 Secondary hyperparathyroidism of renal origin: Secondary | ICD-10-CM | POA: Diagnosis not present

## 2022-03-27 DIAGNOSIS — N186 End stage renal disease: Secondary | ICD-10-CM | POA: Diagnosis not present

## 2022-03-27 DIAGNOSIS — Z992 Dependence on renal dialysis: Secondary | ICD-10-CM | POA: Diagnosis not present

## 2022-03-27 DIAGNOSIS — D689 Coagulation defect, unspecified: Secondary | ICD-10-CM | POA: Diagnosis not present

## 2022-03-27 DIAGNOSIS — Z23 Encounter for immunization: Secondary | ICD-10-CM | POA: Diagnosis not present

## 2022-03-27 DIAGNOSIS — Z4901 Encounter for fitting and adjustment of extracorporeal dialysis catheter: Secondary | ICD-10-CM | POA: Diagnosis not present

## 2022-03-29 DIAGNOSIS — D631 Anemia in chronic kidney disease: Secondary | ICD-10-CM | POA: Diagnosis not present

## 2022-03-29 DIAGNOSIS — E1121 Type 2 diabetes mellitus with diabetic nephropathy: Secondary | ICD-10-CM | POA: Diagnosis not present

## 2022-03-29 DIAGNOSIS — Z992 Dependence on renal dialysis: Secondary | ICD-10-CM | POA: Diagnosis not present

## 2022-03-29 DIAGNOSIS — Z23 Encounter for immunization: Secondary | ICD-10-CM | POA: Diagnosis not present

## 2022-03-29 DIAGNOSIS — N186 End stage renal disease: Secondary | ICD-10-CM | POA: Diagnosis not present

## 2022-03-29 DIAGNOSIS — N185 Chronic kidney disease, stage 5: Secondary | ICD-10-CM | POA: Diagnosis not present

## 2022-03-29 DIAGNOSIS — Z4901 Encounter for fitting and adjustment of extracorporeal dialysis catheter: Secondary | ICD-10-CM | POA: Diagnosis not present

## 2022-03-29 DIAGNOSIS — N2581 Secondary hyperparathyroidism of renal origin: Secondary | ICD-10-CM | POA: Diagnosis not present

## 2022-03-29 DIAGNOSIS — D689 Coagulation defect, unspecified: Secondary | ICD-10-CM | POA: Diagnosis not present

## 2022-03-31 DIAGNOSIS — Z992 Dependence on renal dialysis: Secondary | ICD-10-CM | POA: Diagnosis not present

## 2022-03-31 DIAGNOSIS — N186 End stage renal disease: Secondary | ICD-10-CM | POA: Diagnosis not present

## 2022-03-31 DIAGNOSIS — N2581 Secondary hyperparathyroidism of renal origin: Secondary | ICD-10-CM | POA: Diagnosis not present

## 2022-03-31 DIAGNOSIS — E1121 Type 2 diabetes mellitus with diabetic nephropathy: Secondary | ICD-10-CM | POA: Diagnosis not present

## 2022-03-31 DIAGNOSIS — Z4901 Encounter for fitting and adjustment of extracorporeal dialysis catheter: Secondary | ICD-10-CM | POA: Diagnosis not present

## 2022-03-31 DIAGNOSIS — N185 Chronic kidney disease, stage 5: Secondary | ICD-10-CM | POA: Diagnosis not present

## 2022-03-31 DIAGNOSIS — D689 Coagulation defect, unspecified: Secondary | ICD-10-CM | POA: Diagnosis not present

## 2022-04-01 ENCOUNTER — Other Ambulatory Visit: Payer: Self-pay | Admitting: Family Medicine

## 2022-04-01 DIAGNOSIS — E039 Hypothyroidism, unspecified: Secondary | ICD-10-CM

## 2022-04-01 DIAGNOSIS — D689 Coagulation defect, unspecified: Secondary | ICD-10-CM | POA: Diagnosis not present

## 2022-04-01 DIAGNOSIS — Z992 Dependence on renal dialysis: Secondary | ICD-10-CM | POA: Diagnosis not present

## 2022-04-01 DIAGNOSIS — Z4901 Encounter for fitting and adjustment of extracorporeal dialysis catheter: Secondary | ICD-10-CM | POA: Diagnosis not present

## 2022-04-01 DIAGNOSIS — N186 End stage renal disease: Secondary | ICD-10-CM | POA: Diagnosis not present

## 2022-04-01 DIAGNOSIS — N2581 Secondary hyperparathyroidism of renal origin: Secondary | ICD-10-CM | POA: Diagnosis not present

## 2022-04-01 DIAGNOSIS — N185 Chronic kidney disease, stage 5: Secondary | ICD-10-CM | POA: Diagnosis not present

## 2022-04-01 DIAGNOSIS — E1121 Type 2 diabetes mellitus with diabetic nephropathy: Secondary | ICD-10-CM | POA: Diagnosis not present

## 2022-04-03 DIAGNOSIS — N186 End stage renal disease: Secondary | ICD-10-CM | POA: Diagnosis not present

## 2022-04-03 DIAGNOSIS — E1122 Type 2 diabetes mellitus with diabetic chronic kidney disease: Secondary | ICD-10-CM | POA: Diagnosis not present

## 2022-04-03 DIAGNOSIS — Z992 Dependence on renal dialysis: Secondary | ICD-10-CM | POA: Diagnosis not present

## 2022-04-05 ENCOUNTER — Encounter (HOSPITAL_COMMUNITY): Payer: Self-pay | Admitting: *Deleted

## 2022-04-11 NOTE — Progress Notes (Deleted)
[facility] [address] [place] [address]   CLINIC:  Medical Oncology/Hematology  CONSULT NOTE  Patient Care Team: Ivy Lynn, NP as PCP - General (Nurse Practitioner) Minus Breeding, MD as Consulting Physician (Cardiology) Tanda Rockers, MD as Consulting Physician (Pulmonary Disease) Jamal Maes, MD as Consulting Physician (Nephrology) Steffanie Rainwater, DPM as Consulting Physician (Podiatry) Rosita Fire, MD as Consulting Physician (Nephrology)  CHIEF COMPLAINTS/PURPOSE OF CONSULTATION:  Macrocytic anemia and elevated ferritin  HISTORY OF PRESENTING ILLNESS:  [name] 82 y.o. female is here at the request of her primary care provider (NP Jac Canavan) for anemia and elevated ferritin.  ***Iron panel from 12/23 shows severely elevated ferritin 2800, elevated iron saturation 86%, and low TIBC 121.  Serum iron WNL at 104.  Review of EMR shows elevated ferritin since 2017.  *** Intermittent anemia since at least 2009.  PMH: *** Dialysis/ESRD (MWF)  SOCIAL: ***  FAMILY: ***  MEDICAL HISTORY:  Past Medical History:  Diagnosis Date   Acute respiratory failure ([place]) 05/2017   Anemia    Anxiety    Arthritis    COPD (chronic obstructive pulmonary disease) (HCC)    Depression    ESRD (end stage renal disease) (HCC)    dialysis MWF   GERD (gastroesophageal reflux disease)    Gout    History of blood transfusion    History of diabetes mellitus    "diet controlled"   HLD (hyperlipidemia)    HOH (hard of hearing)    left ear   Hypertension    hypotensive -since starting dialysis   Hypothyroidism    MRSA (methicillin resistant staph aureus) culture positive 06/01/2017   PAF (paroxysmal atrial fibrillation) ([place])    a. Echo 11/16:  Mild LVH, EF 55-60%, normal wall motion, MAC, mild MR, severe LAE (49 ml/m2), mild RVE, normal RVSF, mild RAE, mild TR, PASP 24 mmHg;  CHADS2-VASc: 4 >> Coumadin followed by PCP    SURGICAL  HISTORY: Past Surgical History:  Procedure Laterality Date   AV FISTULA PLACEMENT  04/03/2012   Procedure: ARTERIOVENOUS (AV) FISTULA CREATION;  Surgeon: Elam Dutch, MD;  Location: [facility];  Service: Vascular;  Laterality: Left;  creation left brachial cephalic fistula    BIOPSY  08/21/2018   Procedure: BIOPSY;  Surgeon: Thornton Park, MD;  Location: [facility];  Service: Gastroenterology;;   CARPAL TUNNEL RELEASE Bilateral    COLONOSCOPY W/ POLYPECTOMY     COLONOSCOPY WITH PROPOFOL N/A 08/21/2018   Procedure: COLONOSCOPY WITH PROPOFOL;  Surgeon: Thornton Park, MD;  Location: [facility];  Service: Gastroenterology;  Laterality: N/A;   DILATION AND CURETTAGE OF UTERUS     ESOPHAGOGASTRODUODENOSCOPY (EGD) WITH PROPOFOL N/A 08/21/2018   Procedure: ESOPHAGOGASTRODUODENOSCOPY (EGD) WITH PROPOFOL;  Surgeon: Thornton Park, MD;  Location: [facility];  Service: Gastroenterology;  Laterality: N/A;   EYE SURGERY Bilateral    bilateral cataract removal   HEMATOMA EVACUATION Left 05/14/2018   Procedure: Incision and Drainage of Left Arm Hematoma;  Surgeon: Elam Dutch, MD;  Location: [facility] OR;  Service: Vascular;  Laterality: Left;   Hemodialysis  catheter Right    IR FLUORO GUIDE CV LINE RIGHT  05/26/2017   IR FLUORO GUIDE CV LINE RIGHT  04/10/2021   IR REMOVAL TUN CV CATH W/O FL  05/24/2017   IR REMOVAL TUN CV CATH W/O FL  04/10/2021   IR US GUIDE VASC ACCESS RIGHT  05/26/2017   IR US GUIDE VASC ACCESS RIGHT  04/10/2021   LIGATION OF ARTERIOVENOUS  FISTULA Left 02/04/2015   Procedure: LIGATION OF BRACHIOCEPHALIC ARTERIOVENOUS  FISTULA;  Surgeon: Conrad Patterson, MD;  Location: [facility];  Service: Vascular;  Laterality: Left;   MULTIPLE TOOTH EXTRACTIONS     PARTIAL HIP ARTHROPLASTY     POLYPECTOMY  08/21/2018   Procedure: POLYPECTOMY;  Surgeon: Thornton Park, MD;  Location[facility];  Service: Gastroenterology;;   PORTACATH PLACEMENT     REVERSE SHOULDER ARTHROPLASTY Left 01/22/2016    Procedure: LEFT REVERSE SHOULDER ARTHROPLASTY;  Surgeon: Netta Cedars, MD;  Location: [facility];  Service: Orthopedics;  Laterality: Left;   SHOULDER ARTHROSCOPY Bilateral    SPLIT NIGHT STUDY  07/26/2015   STERIOD INJECTION Right 01/22/2016   Procedure: RIGHT RING FINGER STEROID INJECTION;  Surgeon: Netta Cedars, MD;  Location: [facility];  Service: Orthopedics;  Laterality: Right;   TEE WITHOUT CARDIOVERSION N/A 05/26/2017   Procedure: TRANSESOPHAGEAL ECHOCARDIOGRAM (TEE);  Surgeon: Lelon Perla, MD;  Location: [facility];  Service: Cardiovascular;  Laterality: N/A;   THROMBECTOMY BRACHIAL ARTERY Left 02/06/2015   Procedure: EVACUATION OF LEFT ARM HEMATOMA;  Surgeon: Angelia Mould, MD;  Location: [facility] [place];  Service: Vascular;  Laterality: Left;   TOTAL KNEE ARTHROPLASTY Bilateral    TUBAL LIGATION      SOCIAL HISTORY: Social History   Socioeconomic History   Marital status: Widowed    Spouse name: Not on file   Number of children: 6   Years of education: 10   Highest education level: 10th grade  Occupational History   Occupation: RETIRED  Tobacco Use   Smoking status: Former    Packs/day: 0.25    Years: 2.00    Total pack years: 0.50    Types: Cigarettes    Quit date: 04/04/1998    Years since quitting: 24.0   Smokeless tobacco: Never  Vaping Use   Vaping Use: Never used  Substance and Sexual Activity   Alcohol use: No    Alcohol/week: 0.0 standard drinks of alcohol   Drug use: No   Sexual activity: Not Currently    Birth control/protection: None  Other Topics Concern   Not on file  Social History Narrative   ** Merged History Encounter ** Widowed   6 children   Lives with son and daughter in [place]   Homemaker   ESRD - dialysis 3x per week   Cognitive impairment   Social Determinants of Health   Financial Resource Strain: Low Risk  (12/01/2020)   Overall Financial Resource Strain (CARDIA)    Difficulty of Paying Living Expenses: Not hard at all  Food  Insecurity: No Food Insecurity (12/01/2020)   Hunger Vital Sign    Worried About Running Out of Food in the Last Year: Never true    [place] in the Last Year: Never true  Transportation Needs: No Transportation Needs (12/01/2020)   PRAPARE - Hydrologist (Medical): No    Lack of Transportation (Non-Medical): No  Physical Activity: Inactive (12/01/2020)   Exercise Vital Sign    Days of Exercise per Week: 0 days    Minutes of Exercise per Session: 0 min  Stress: No Stress Concern Present (12/01/2020)   [place]    Feeling of Stress : Only a little  Social Connections: Moderately Isolated (12/01/2020)   Social Connection and Isolation Panel [NHANES]    Frequency of Communication with Friends and Family: More than  three times a week    Frequency of Social Gatherings with Friends and Family: More than three times a week    Attends Religious Services: 1 to 4 times per year    Active Member of [employer] or Organizations: No    Attends Archivist Meetings: Never    Marital Status: Widowed  Intimate Partner Violence: Not At Risk (12/01/2020)   Humiliation, Afraid, Rape, and Kick questionnaire    Fear of Current or Ex-Partner: No    Emotionally Abused: No    Physically Abused: No    Sexually Abused: No    FAMILY HISTORY: Family History  Problem Relation Age of Onset   Arthritis Mother    Diabetes Father        before age 85   Heart disease Father    Diabetes Sister    Dementia Sister    Cancer Sister        Unknown   Other Sister        Died in car accident   Cancer Brother    Stroke Brother    Hyperlipidemia Daughter    Hypertension Daughter    Diabetes Daughter    Diabetes Daughter    Hypertension Daughter    Hyperlipidemia Daughter    Hypertension Son    Hyperlipidemia Son    Hepatitis C Son     ALLERGIES:  is allergic to amoxicillin, elemental sulfur, penicillins,  sulfur, peg [id number]-nacl-nasulf, sulfa drugs cross reactors, miralax [polyethylene glycol], mixed grasses, percocet [oxycodone-acetaminophen], and sulfa antibiotics.  MEDICATIONS:  Current Outpatient Medications  Medication Sig Dispense Refill   acetaminophen (TYLENOL) 325 MG tablet Take 2 tablets (650 mg total) by mouth every 6 (six) hours as needed for mild pain, fever or headache.     albuterol (VENTOLIN HFA) 108 (90 Base) MCG/ACT inhaler TAKE 2 PUFFS BY MOUTH EVERY 6 HOURS AS NEEDED FOR WHEEZE OR SHORTNESS OF BREATH 8.5 each 1   AURYXIA 1 GM 210 MG(Fe) tablet Take 420 mg by mouth 3 (three) times daily with meals.     cinacalcet (SENSIPAR) 30 MG tablet Take 1 tablet (30 mg total) by mouth every Monday, Wednesday, and Friday. 60 tablet 0   citalopram (CELEXA) 40 MG tablet Take 1 tablet (40 mg total) by mouth daily. 90 tablet 1   docusate calcium (SURFAK) 240 MG capsule Take 1 capsule (240 mg total) by mouth daily. 90 capsule 1   doxercalciferol (HECTOROL) 4 MCG/2ML injection Inject 0.5 mLs (1 mcg total) into the vein every Monday, Wednesday, and Friday with hemodialysis. 2 mL 0   ezetimibe (ZETIA) 10 MG tablet Take 1 tablet (10 mg total) by mouth daily. 90 tablet 1   famotidine (PEPCID) 10 MG tablet Take 1 tablet (10 mg total) by mouth daily. 90 tablet 1   hydrALAZINE (APRESOLINE) 10 MG tablet Take 1 tablet (10 mg total) by mouth 3 (three) times daily. 270 tablet 1   HYDROcodone-acetaminophen (NORCO) 10-325 MG tablet Take 1 tablet by mouth every 6 (six) hours as needed. 120 tablet 0   HYDROcodone-acetaminophen (NORCO) 10-325 MG tablet Take 1 tablet by mouth every 6 (six) hours as needed. 120 tablet 0   HYDROcodone-acetaminophen (NORCO) 10-325 MG tablet Take 1 tablet by mouth every 6 (six) hours as needed. 120 tablet 0   levocetirizine (XYZAL) 5 MG tablet TAKE 1 TABLET BY MOUTH EVERY DAY IN THE EVENING 90 tablet 1   levothyroxine (SYNTHROID) 125 MCG tablet Take 1 tablet (125 mcg total) by  mouth daily. (  NEEDS TO BE SEEN BEFORE NEXT REFILL) 30 tablet 0   lisinopril (ZESTRIL) 2.5 MG tablet TAKE 1 TABLET BY MOUTH EVERY DAY 90 tablet 0   methocarbamol (ROBAXIN) 500 MG tablet TAKE 1 TABLET BY MOUTH EVERY 8 HOURS AS NEEDED FOR MUSCLE SPASMS. 60 tablet 2   multivitamin (RENA-VIT) TABS tablet Take 1 tablet by mouth daily.     nystatin cream (MYCOSTATIN) Apply 1 application topically 2 (two) times daily as needed for dry skin. 30 g 2   pantoprazole (PROTONIX) 20 MG tablet Take 1 tablet (20 mg total) by mouth daily. 90 tablet 1   pramipexole (MIRAPEX) 0.125 MG tablet Take 1 tablet (0.125 mg total) by mouth at bedtime. 90 tablet 1   Romosozumab-aqqg (EVENITY) 105 MG/1.17ML SOSY injection Inject 210 mg into the skin every 30 (thirty) days. (INJECT 2 SYRINGES) 2.34 mL 10   SYMBICORT 80-4.5 MCG/ACT inhaler INHALE 2 PUFFS INTO THE LUNGS TWICE A DAY 30.6 each 0   No current facility-administered medications for this visit.    REVIEW OF SYSTEMS:   Review of Systems - Oncology    PHYSICAL EXAMINATION: ECOG PERFORMANCE STATUS: {CHL ONC ECOG [phone]}  There were no vitals filed for this visit. There were no vitals filed for this visit.  Physical Exam    LABORATORY DATA:  I have reviewed the data as listed Recent Results (from the past 2160 hour(s))  Iron, TIBC and Ferritin Panel     Status: Abnormal   Collection Time: 03/08/22 12:47 PM  Result Value Ref Range   Total Iron Binding Capacity <121 (LL) 250 - 450 ug/dL   UIBC <17 (L) 118 - 369 ug/dL    Comment: **Verified by repeat analysis**   Iron 104 27 - 139 ug/dL   Iron Saturation >86 (HH) 15 - 55 %   Ferritin 2,800 (H) 15 - 150 ng/mL    Comment: Results confirmed on dilution.     RADIOGRAPHIC STUDIES: I have personally reviewed the radiological images as listed and agreed with the findings in the report. No results found.  ASSESSMENT & PLAN: ***   PLAN SUMMARY & DISPOSITION: ***  All questions were answered.  The patient knows to call the clinic with any problems, questions or concerns.   Medical decision making: ***  Time spent on visit: I spent *** minutes counseling the patient face to face. The total time spent in the appointment was *** minutes and more than 50% was on counseling.  I, Tarri Abernethy PA-C, have seen this patient in conjunction with Dr. Derek Jack.  Greater than 50% of visit was performed by Dr. Delton Coombes.   Harriett Rush, PA-C *** ***  DR. Delton CoombesMarland Kitchen

## 2022-04-12 ENCOUNTER — Inpatient Hospital Stay: Payer: 59 | Attending: Hematology | Admitting: Hematology

## 2022-04-14 ENCOUNTER — Encounter (HOSPITAL_COMMUNITY): Payer: Self-pay | Admitting: *Deleted

## 2022-04-21 ENCOUNTER — Ambulatory Visit (INDEPENDENT_AMBULATORY_CARE_PROVIDER_SITE_OTHER): Payer: 59 | Admitting: *Deleted

## 2022-04-21 DIAGNOSIS — M810 Age-related osteoporosis without current pathological fracture: Secondary | ICD-10-CM

## 2022-04-21 DIAGNOSIS — M8000XD Age-related osteoporosis with current pathological fracture, unspecified site, subsequent encounter for fracture with routine healing: Secondary | ICD-10-CM

## 2022-04-21 MED ORDER — ROMOSOZUMAB-AQQG 105 MG/1.17ML ~~LOC~~ SOSY
210.0000 mg | PREFILLED_SYRINGE | SUBCUTANEOUS | Status: AC
  Administered 2022-04-21 – 2022-09-02 (×4): 210 mg via SUBCUTANEOUS

## 2022-04-21 NOTE — Patient Instructions (Signed)
Romosozumab Injection What is this medication? ROMOSOZUMAB (roe moe SOZ ue mab) prevents and treats osteoporosis. It works by making your bones stronger and less likely to break (fracture). It is a monoclonal antibody. This medicine may be used for other purposes; ask your health care provider or pharmacist if you have questions. COMMON BRAND NAME(S): EVENITY What should I tell my care team before I take this medication? They need to know if you have any of these conditions: Dental disease Heart attack Heart disease Kidney problems Low levels of calcium in the blood On dialysis Stroke Wear dentures An unusual or allergic reaction to romosozumab, other medications, foods, dyes or preservatives Pregnant or trying to get pregnant Breast-feeding How should I use this medication? This medication is injected under the skin. It is given by your care team in a hospital or clinic setting. A special MedGuide will be given to you by the pharmacist with each prescription and refill. Be sure to read this information carefully each time. Talk to your care team about the use of this medication in children. Special care may be needed. Overdosage: If you think you have taken too much of this medicine contact a poison control center or emergency room at once. NOTE: This medicine is only for you. Do not share this medicine with others. What if I miss a dose? Keep appointments for follow-up doses. It is important not to miss your dose. Call your care team if you are unable to keep an appointment. What may interact with this medication? Interactions are not expected. This list may not describe all possible interactions. Give your health care provider a list of all the medicines, herbs, non-prescription drugs, or dietary supplements you use. Also tell them if you smoke, drink alcohol, or use illegal drugs. Some items may interact with your medicine. What should I watch for while using this medication? Your  condition will be monitored carefully while you are receiving this medication. You may need bloodwork while taking this medication. You should make sure you get enough calcium and vitamin D while you are taking this medication. Discuss the foods you eat and the vitamins you take with your care team. Some people who take this medication have severe bone, joint, or muscle pain. This medication may also increase your risk for jaw problems or a broken thigh bone. Tell your care team right away if you have severe pain in your jaw, bones, joints, or muscles. Tell you care team if you have any pain that does not go away or that gets worse. Tell your dentist and dental surgeon that you are taking this medication. You should not have major dental surgery while on this medication. See your dentist to have a dental exam and fix any dental problems before starting this medication. Take good care of your teeth while on this medication. Make sure you see your dentist for regular follow-up appointments. What side effects may I notice from receiving this medication? Side effects that you should report to your care team as soon as possible: Allergic reactions or angioedema--skin rash, itching or hives, swelling of the face, eyes, lips, tongue, arms, or legs, trouble swallowing or breathing Heart attack--pain or tightness in the chest, shoulders, arms, or jaw, nausea, shortness of breath, cold or clammy skin, feeling faint or lightheaded Low calcium level--muscle pain or cramps, confusion, tingling, or numbness in the hands or feet Osteonecrosis of the jaw--pain, swelling, or redness in the mouth, numbness of the jaw, poor healing after dental work,   unusual discharge from the mouth, visible bones in the mouth Severe bone, joint, or muscle pain Stroke--sudden numbness or weakness of the face, arm, or leg, trouble speaking, confusion, trouble walking, loss of balance or coordination, dizziness, severe headache, change in  vision Side effects that usually do not require medical attention (report to your care team if they continue or are bothersome): Headache Joint pain Muscle spasms Pain, redness, or irritation at injection site Swelling of the ankles, hands, or feet This list may not describe all possible side effects. Call your doctor for medical advice about side effects. You may report side effects to FDA at 1-800-FDA-1088. Where should I keep my medication? This medication is given in a hospital or clinic. It will not be stored at home. NOTE: This sheet is a summary. It may not cover all possible information. If you have questions about this medicine, talk to your doctor, pharmacist, or health care provider.  2023 Elsevier/Gold Standard (2021-04-21 00:00:00)  

## 2022-04-21 NOTE — Progress Notes (Signed)
Evenity given and patient tolerated well.

## 2022-05-23 ENCOUNTER — Ambulatory Visit: Payer: 59

## 2022-05-23 ENCOUNTER — Telehealth: Payer: Self-pay | Admitting: Family Medicine

## 2022-05-25 ENCOUNTER — Ambulatory Visit (INDEPENDENT_AMBULATORY_CARE_PROVIDER_SITE_OTHER): Payer: 59

## 2022-05-25 DIAGNOSIS — M810 Age-related osteoporosis without current pathological fracture: Secondary | ICD-10-CM

## 2022-05-25 DIAGNOSIS — M8000XD Age-related osteoporosis with current pathological fracture, unspecified site, subsequent encounter for fracture with routine healing: Secondary | ICD-10-CM

## 2022-05-25 NOTE — Progress Notes (Signed)
Evenity injections given to right and left side of abdomen.  Patient tolerated well.

## 2022-05-30 ENCOUNTER — Other Ambulatory Visit: Payer: Self-pay | Admitting: *Deleted

## 2022-05-30 DIAGNOSIS — I10 Essential (primary) hypertension: Secondary | ICD-10-CM

## 2022-05-30 DIAGNOSIS — J449 Chronic obstructive pulmonary disease, unspecified: Secondary | ICD-10-CM

## 2022-05-30 MED ORDER — LISINOPRIL 2.5 MG PO TABS: 2.5000 mg | ORAL_TABLET | Freq: Every day | ORAL | 0 refills | Status: DC

## 2022-05-30 MED ORDER — BUDESONIDE-FORMOTEROL FUMARATE 80-4.5 MCG/ACT IN AERO: INHALATION_SPRAY | RESPIRATORY_TRACT | 0 refills | Status: DC

## 2022-05-30 NOTE — Telephone Encounter (Signed)
Rakes NTBS 30 days given.

## 2022-06-02 ENCOUNTER — Encounter: Payer: Self-pay | Admitting: Radiology

## 2022-06-07 ENCOUNTER — Encounter (HOSPITAL_COMMUNITY): Payer: Self-pay | Admitting: *Deleted

## 2022-06-08 ENCOUNTER — Ambulatory Visit (INDEPENDENT_AMBULATORY_CARE_PROVIDER_SITE_OTHER): Payer: 59 | Admitting: Family Medicine

## 2022-06-08 ENCOUNTER — Encounter: Payer: Self-pay | Admitting: Family Medicine

## 2022-06-08 VITALS — BP 122/77 | HR 73 | Temp 98.2°F | Ht 65.0 in | Wt 108.5 lb

## 2022-06-08 DIAGNOSIS — M1990 Unspecified osteoarthritis, unspecified site: Secondary | ICD-10-CM | POA: Diagnosis not present

## 2022-06-08 DIAGNOSIS — M546 Pain in thoracic spine: Secondary | ICD-10-CM | POA: Diagnosis not present

## 2022-06-08 DIAGNOSIS — N186 End stage renal disease: Secondary | ICD-10-CM | POA: Diagnosis not present

## 2022-06-08 DIAGNOSIS — G8929 Other chronic pain: Secondary | ICD-10-CM

## 2022-06-08 DIAGNOSIS — Z992 Dependence on renal dialysis: Secondary | ICD-10-CM

## 2022-06-08 MED ORDER — HYDROCODONE-ACETAMINOPHEN 10-325 MG PO TABS: 1.0000 | ORAL_TABLET | Freq: Four times a day (QID) | ORAL | 0 refills | Status: DC | PRN

## 2022-06-08 NOTE — Progress Notes (Signed)
Established Patient Office Visit  Subjective   Patient ID: [name], female    DOB: [birth year]  Age: 82 y.o. MRN: [id number]  Chief Complaint  Patient presents with   Establish Care    Former [facility] patient    HPI Presents today with daughter. Pt is in wheelchair, able to walk <10 feet distances.   Main concern today is chronic pain. Pt is taking norco as needed for pain. Typically takes one at bedtime and states that it helps her with restless leg. Some days requires medication in the am upon awakening. States that the pain is worse in the mornings when she gets up.  Started with long term pain control in 2022 after she fell and broke her hip, required surgery.  Complains of pain in her left leg, from first hip surgery. November 2023 fell and required surgery for hip as well.  Pt has not been taking mirapex for >30 days. Pt does not recall why she stopped or if it helped her symptoms. Prior note states that she was controlled on current regimen. Pt does not recall taking Robaxin. Has not taken Robaxin in >30 days per chart review.  Daughter states that they previously requested PT in home to help with strength.    Review of Systems  HENT:         "Fluttering" in left ear   All other systems reviewed and are negative.     Objective:     BP 122/77   Pulse 73   Temp 98.2 F (36.8 C)   Ht '5\' 5"'$  (1.651 m)   Wt 108 lb 7.5 oz (49.2 kg)   SpO2 95%   BMI 18.05 kg/m    Physical Exam Constitutional:      General: She is not in acute distress.    Appearance: Normal appearance. She is not ill-appearing, toxic-appearing or diaphoretic.  HENT:     Right Ear: There is impacted cerumen.     Left Ear: There is impacted cerumen.  Cardiovascular:     Rate and Rhythm: Normal rate.     Pulses: Normal pulses.     Heart sounds: Normal heart sounds. No murmur heard.    No gallop.  Pulmonary:     Effort: Pulmonary effort is normal. No respiratory distress.     Breath sounds:  Normal breath sounds. No stridor. No wheezing, rhonchi or rales.  Skin:    General: Skin is warm.     Capillary Refill: Capillary refill takes less than 2 seconds.  Neurological:     General: No focal deficit present.     Mental Status: She is alert and oriented to person, place, and time. Mental status is at baseline.     Motor: No weakness.  Psychiatric:        Mood and Affect: Mood normal.        Behavior: Behavior normal.        Thought Content: Thought content normal.        Judgment: Judgment normal.      Assessment & Plan:  1. Chronic midline thoracic back pain 2. Arthritis Discussed with patient and daughter discontinuing Norco, especially given polypharmacy, pt age, and frequent falls. Referral placed as below to pain clinic. Will collect labs today for drug screen. Previous drug screen negative when expected positive for opiates (05/27/21). Referral placed for in home PT/OT evaluation for patient. Labs as below. Will communicate results to patient once available. Due to renal function limited in medications  for pt. Pt previously on mirapex, discontinued for unknown reason. Did not restart today as she will be evaluated by pain management.  - Ambulatory referral to Pain Clinic - Ambulatory referral to [facility] - HYDROcodone-acetaminophen (NORCO) 10-325 MG tablet; Take 1 tablet by mouth every 6 (six) hours as needed.  Dispense: 120 tablet; Refill: 0 - Anemia Profile B - CMP14+EGFR - Drug Screen 10 W/Conf, Se  3. ESRD (end stage renal disease) on dialysis [facility]) Will follow with labs as above.    The above assessment and management plan was discussed with the patient. The patient verbalized understanding of and has agreed to the management plan using shared-decision making. Patient is aware to call the clinic if they develop any new symptoms or if symptoms fail to improve or worsen. Patient is aware when to return to the clinic for a follow-up visit. Patient educated on when it  is appropriate to go to the emergency department.   Discussed OTC treatment for earwax.   Donzetta Kohut, DNP-FNP [facility] [address] [place], [place] [address] [phone]

## 2022-06-22 ENCOUNTER — Ambulatory Visit (INDEPENDENT_AMBULATORY_CARE_PROVIDER_SITE_OTHER): Payer: 59 | Admitting: Family Medicine

## 2022-06-22 ENCOUNTER — Encounter: Payer: Self-pay | Admitting: Family Medicine

## 2022-06-22 VITALS — BP 125/81 | HR 79 | Temp 98.5°F | Ht 65.0 in | Wt 108.0 lb

## 2022-06-22 DIAGNOSIS — M8000XD Age-related osteoporosis with current pathological fracture, unspecified site, subsequent encounter for fracture with routine healing: Secondary | ICD-10-CM

## 2022-06-22 DIAGNOSIS — M810 Age-related osteoporosis without current pathological fracture: Secondary | ICD-10-CM

## 2022-06-22 DIAGNOSIS — I480 Paroxysmal atrial fibrillation: Secondary | ICD-10-CM

## 2022-06-22 DIAGNOSIS — I499 Cardiac arrhythmia, unspecified: Secondary | ICD-10-CM

## 2022-06-22 DIAGNOSIS — M1990 Unspecified osteoarthritis, unspecified site: Secondary | ICD-10-CM

## 2022-06-22 DIAGNOSIS — M546 Pain in thoracic spine: Secondary | ICD-10-CM

## 2022-06-22 DIAGNOSIS — N186 End stage renal disease: Secondary | ICD-10-CM | POA: Diagnosis not present

## 2022-06-22 DIAGNOSIS — Z992 Dependence on renal dialysis: Secondary | ICD-10-CM

## 2022-06-22 DIAGNOSIS — K429 Umbilical hernia without obstruction or gangrene: Secondary | ICD-10-CM

## 2022-06-22 DIAGNOSIS — G8929 Other chronic pain: Secondary | ICD-10-CM

## 2022-06-22 LAB — ANEMIA PROFILE B
Basophils Absolute: 0.1 10*3/uL (ref 0.0–0.2)
Basos: 1 %
EOS (ABSOLUTE): 0.1 10*3/uL (ref 0.0–0.4)
Eos: 1 %
Ferritin: 2226 ng/mL — ABNORMAL HIGH (ref 15–150)
Folate: >20 ng/mL (ref >3.0)
Hematocrit: 31.9 % — ABNORMAL LOW (ref 34.0–46.6)
Hemoglobin: 10.6 g/dL — ABNORMAL LOW (ref 11.1–15.9)
Immature Grans (Abs): 0 10*3/uL (ref 0.0–0.1)
Immature Granulocytes: 0 %
Iron Saturation: 37 % (ref 15–55)
Iron: 49 ug/dL (ref 27–139)
Lymphocytes Absolute: 1.2 10*3/uL (ref 0.7–3.1)
Lymphs: 16 %
MCH: 29.8 pg (ref 26.6–33.0)
MCHC: 33.2 g/dL (ref 31.5–35.7)
MCV: 90 fL (ref 79–97)
Monocytes Absolute: 0.7 10*3/uL (ref 0.1–0.9)
Monocytes: 9 %
Neutrophils Absolute: 5.6 10*3/uL (ref 1.4–7.0)
Neutrophils: 73 %
Platelets: 240 10*3/uL (ref 150–450)
RBC: 3.56 x10E6/uL — ABNORMAL LOW (ref 3.77–5.28)
RDW: 14.3 % (ref 11.7–15.4)
Retic Ct Pct: 0.8 % (ref 0.6–2.6)
Total Iron Binding Capacity: 132 ug/dL — ABNORMAL LOW (ref 250–450)
UIBC: 83 ug/dL — ABNORMAL LOW (ref 118–369)
Vitamin B-12: 656 pg/mL (ref 232–1245)
WBC: 7.6 10*3/uL (ref 3.4–10.8)

## 2022-06-22 LAB — OPIATES,MS,WB/SP RFX
6-Acetylmorphine: NEGATIVE
Codeine: NEGATIVE ng/mL
Dihydrocodeine: 1.8 ng/mL
Hydrocodone: 23 ng/mL
Hydromorphone: NEGATIVE ng/mL
Morphine: NEGATIVE ng/mL
Opiate Confirmation: POSITIVE

## 2022-06-22 LAB — CMP14+EGFR
ALT: 21 IU/L (ref 0–32)
AST: 27 IU/L (ref 0–40)
Albumin/Globulin Ratio: 1.5 (ref 1.2–2.2)
Albumin: 3.6 g/dL — ABNORMAL LOW (ref 3.7–4.7)
Alkaline Phosphatase: 131 IU/L — ABNORMAL HIGH (ref 44–121)
BUN/Creatinine Ratio: 6 — ABNORMAL LOW (ref 12–28)
BUN: 23 mg/dL (ref 8–27)
Bilirubin Total: 0.4 mg/dL (ref 0.0–1.2)
CO2: 29 mmol/L (ref 20–29)
Calcium: 8.3 mg/dL — ABNORMAL LOW (ref 8.7–10.3)
Chloride: 99 mmol/L (ref 96–106)
Creatinine, Ser: 3.6 mg/dL (ref 0.57–1.00)
Globulin, Total: 2.4 g/dL (ref 1.5–4.5)
Glucose: 80 mg/dL (ref 70–99)
Potassium: 5.1 mmol/L (ref 3.5–5.2)
Sodium: 144 mmol/L (ref 134–144)
Total Protein: 6 g/dL (ref 6.0–8.5)
eGFR: 12 mL/min/{1.73_m2} — ABNORMAL LOW (ref >59)

## 2022-06-22 LAB — DRUG SCREEN 10 W/CONF, SERUM
Amphetamines, IA: NEGATIVE ng/mL
Barbiturates, IA: NEGATIVE ug/mL
Benzodiazepines, IA: NEGATIVE ng/mL
Cocaine & Metabolite, IA: NEGATIVE ng/mL
Methadone, IA: NEGATIVE ng/mL
Opiates, IA: POSITIVE ng/mL — AB
Oxycodones, IA: NEGATIVE ng/mL
Phencyclidine, IA: NEGATIVE ng/mL
Propoxyphene, IA: NEGATIVE ng/mL
THC(Marijuana) Metabolite, IA: NEGATIVE ng/mL

## 2022-06-22 LAB — OXYCODONES,MS,WB/SP RFX
Oxycocone: NEGATIVE ng/mL
Oxycodones Confirmation: NEGATIVE
Oxymorphone: NEGATIVE ng/mL

## 2022-06-22 MED ORDER — ROMOSOZUMAB-AQQG 105 MG/1.17ML ~~LOC~~ SOSY
210.0000 mg | PREFILLED_SYRINGE | Freq: Once | SUBCUTANEOUS | Status: AC
  Administered 2022-06-22: 210 mg via SUBCUTANEOUS

## 2022-06-22 NOTE — Progress Notes (Signed)
Acute Office Visit  Subjective:  Patient ID: [name], female    DOB: 24-Jan-1941, 82 y.o.   MRN: [id number]  Chief Complaint  Patient presents with   Medical Management of Chronic Issues    HPI Patient is in today for follow up of chronic conditions.  Concern for burning in hernia of abdomen. States that it grows while she eats  and then burns. It is located in her naval and rises up her abdomen. States that Milk makes it better.   Pain is doing better. Was not contacted by pain management.  States that she still wakes up about every night with RLS with her legs hurting. Takes her hydrocodone before bed which helps. Still has regular bowel movements. Takes one at night.   ESRD  Dialysis T,Th,S. Saw NP at Dialysis yesterday. [facility] [address]   ROS As per HPI  Objective:  BP 125/81   Pulse 79   Temp 98.5 F (36.9 C)   Ht 5\' 5"  (1.651 m)   Wt 108 lb (49 kg)   SpO2 94%   BMI 17.97 kg/m   Physical Exam Constitutional:      General: She is not in acute distress.    Appearance: Normal appearance. She is not ill-appearing, toxic-appearing or diaphoretic.  Cardiovascular:     Rate and Rhythm: Normal rate. Rhythm irregular.     Heart sounds: Normal heart sounds. No murmur heard.    No gallop.  Pulmonary:     Effort: Pulmonary effort is normal. No respiratory distress.     Breath sounds: Normal breath sounds. No stridor. No wheezing, rhonchi or rales.  Abdominal:     General: Bowel sounds are normal.     Palpations: Abdomen is soft.     Tenderness: There is no abdominal tenderness. There is no rebound.     Hernia: A hernia is present.  Skin:    General: Skin is warm and dry.     Capillary Refill: Capillary refill takes less than 2 seconds.     Findings: Bruising present.  Neurological:     General: No focal deficit present.     Mental Status: She is alert and oriented to person, place, and time. Mental status is at baseline.     Motor: No weakness.   Psychiatric:        Mood and Affect: Mood normal.        Behavior: Behavior normal.        Thought Content: Thought content normal.        Judgment: Judgment normal.    Assessment & Plan:  Chronic midline thoracic back pain - Pt filled 30 day supply of Norco on 06/10/22 per PDMP review. Pain management clinic referral closed for unknown reason. Will discuss with referrals coordinator to determine reason and to get patient connected with pain management clinic.   ESRD (end stage renal disease) on dialysis [facility]) Unable to review notes from patients last dialysis appointment with nephrology. Pt unsure if she is taking Sensipar. Pt daughter states that she was taken off Hydralazine by nephrology. Instructed pt and daughter to bring all medications to next appointment to review what patient is taking and how.   PAF (paroxysmal atrial fibrillation) (HCC) Irregular heart beat -     EKG 12-Lead Irregular heart rhythm heard on exam today. Pt has history of Afib. Is not on anticoagulation due to bleeding. Has not followed with cardiology in 2 years per chart review. Offered referral to  cardiology. Pt and daughter declined at this time. Confirmed on EKG reviewed by myself that patient is in Atrial fibrillation at this time. Rate controlled at this time and BP hemodynamically stable at this time.   Age-related osteoporosis with current pathological fracture with routine healing, subsequent encounter -     Romosozumab-aqqg  Arthritis Referrals placed as below. Pt previously requested [facility]. However, due to insurance and staffing was not able to obtain and appointment. Will request ambulatory referral to PT and OT.  -     Ambulatory referral to Physical Therapy -     Ambulatory referral to Occupational Therapy  Umbilical hernia without obstruction and without gangrene Referral placed for GI for evaluation of hernia. Pt admitted in April 2022 for GI bleed and has not had follow up with GI per  chart chart review. Pt states that they are taking both pepcid and protonix. Per chart review, protonix has not been prescribed since 01/27/22 with a 90 day supply. Per chart review, pepcid was dispensed with a 90 day supply 05/29/22.  -     US Abdomen Limited; Future -     Ambulatory referral to Gastroenterology   The above assessment and management plan was discussed with the patient. The patient verbalized understanding of and has agreed to the management plan using shared-decision making. Patient is aware to call the clinic if they develop any new symptoms or if symptoms fail to improve or worsen. Patient is aware when to return to the clinic for a follow-up visit. Patient educated on when it is appropriate to go to the emergency department.   2 weeks   Donzetta Kohut, DNP-FNP [facility] [address] [place], [place] [address] [phone]

## 2022-06-24 ENCOUNTER — Ambulatory Visit: Payer: 59

## 2022-06-28 ENCOUNTER — Encounter: Payer: Self-pay | Admitting: *Deleted

## 2022-07-05 ENCOUNTER — Encounter: Payer: Self-pay | Admitting: Family Medicine

## 2022-07-06 ENCOUNTER — Ambulatory Visit (INDEPENDENT_AMBULATORY_CARE_PROVIDER_SITE_OTHER): Payer: 59 | Admitting: Family Medicine

## 2022-07-06 VITALS — BP 163/96 | HR 81 | Temp 98.3°F | Ht 65.0 in | Wt 108.0 lb

## 2022-07-06 DIAGNOSIS — G8929 Other chronic pain: Secondary | ICD-10-CM

## 2022-07-06 DIAGNOSIS — K2100 Gastro-esophageal reflux disease with esophagitis, without bleeding: Secondary | ICD-10-CM | POA: Diagnosis not present

## 2022-07-06 DIAGNOSIS — M546 Pain in thoracic spine: Secondary | ICD-10-CM

## 2022-07-06 DIAGNOSIS — R899 Unspecified abnormal finding in specimens from other organs, systems and tissues: Secondary | ICD-10-CM

## 2022-07-06 DIAGNOSIS — E785 Hyperlipidemia, unspecified: Secondary | ICD-10-CM

## 2022-07-06 DIAGNOSIS — F419 Anxiety disorder, unspecified: Secondary | ICD-10-CM

## 2022-07-06 DIAGNOSIS — Z9981 Dependence on supplemental oxygen: Secondary | ICD-10-CM

## 2022-07-06 DIAGNOSIS — F330 Major depressive disorder, recurrent, mild: Secondary | ICD-10-CM

## 2022-07-06 DIAGNOSIS — I10 Essential (primary) hypertension: Secondary | ICD-10-CM | POA: Diagnosis not present

## 2022-07-06 DIAGNOSIS — E079 Disorder of thyroid, unspecified: Secondary | ICD-10-CM

## 2022-07-06 DIAGNOSIS — M810 Age-related osteoporosis without current pathological fracture: Secondary | ICD-10-CM

## 2022-07-06 DIAGNOSIS — E559 Vitamin D deficiency, unspecified: Secondary | ICD-10-CM

## 2022-07-06 DIAGNOSIS — R7989 Other specified abnormal findings of blood chemistry: Secondary | ICD-10-CM

## 2022-07-06 DIAGNOSIS — I480 Paroxysmal atrial fibrillation: Secondary | ICD-10-CM | POA: Diagnosis not present

## 2022-07-06 DIAGNOSIS — B372 Candidiasis of skin and nail: Secondary | ICD-10-CM

## 2022-07-06 DIAGNOSIS — N186 End stage renal disease: Secondary | ICD-10-CM

## 2022-07-06 DIAGNOSIS — E039 Hypothyroidism, unspecified: Secondary | ICD-10-CM

## 2022-07-06 DIAGNOSIS — R296 Repeated falls: Secondary | ICD-10-CM

## 2022-07-06 DIAGNOSIS — S91301A Unspecified open wound, right foot, initial encounter: Secondary | ICD-10-CM

## 2022-07-06 DIAGNOSIS — Z992 Dependence on renal dialysis: Secondary | ICD-10-CM

## 2022-07-06 DIAGNOSIS — M1990 Unspecified osteoarthritis, unspecified site: Secondary | ICD-10-CM

## 2022-07-06 MED ORDER — NYSTATIN 100000 UNIT/GM EX CREA: 1.0000 | TOPICAL_CREAM | Freq: Two times a day (BID) | CUTANEOUS | 2 refills | Status: DC | PRN

## 2022-07-06 MED ORDER — HYDROCODONE-ACETAMINOPHEN 10-325 MG PO TABS: 1.0000 | ORAL_TABLET | Freq: Four times a day (QID) | ORAL | 0 refills | Status: DC | PRN

## 2022-07-06 NOTE — Progress Notes (Signed)
Acute Office Visit  Subjective:  Patient ID: [name], female    DOB: [birth year], 82 y.o.   MRN: [id number]  Chief Complaint  Patient presents with   2 Week Follow-up   HPI Patient is in today for Follow up of Chronic Conditions  1. Dyslipidemia Patient has been taking zetia for several years   2. PAF (paroxysmal atrial fibrillation) Has cardiology follow up. Is not on anticoagulation. Has not been for several years. Has been rate controlled.   3. Primary hypertension Elevated today. Patient and family state that it is due to her pain from her legs.  Granddaughter and DIL states that she used to have to receive midodrine during Dialysis for BP bottoming out. Takes lisinopril daily.  Has access to home monitor.   4. Gastroesophageal reflux disease with esophagitis without hemorrhage Has GI follow up for Hernia and GERD. Continues to take protonix and pepcid daily which she was prescribed years ago. Does not have any complaints.   5. Acquired hypothyroidism Used to have thyroid and PTH dysfunction apparently from a hospitalization from an infection 3 years ago. Has been on Levothyroxine for ~ 3 years without complaint however has not had TSH level monitored for over a year.   6. ESRD (end stage renal disease) on dialysis Missed HD Thursday. States that she has swelling in bilateral lower extremities.   7. Mild episode of recurrent major depressive disorder 8. Anxiety Feels that she is managing her anxiety. Does not sleep at night due to her anxiety. Stayed up until midnight last night.   9. Vitamin D deficiency Takes renal vitamins. Does not have additional complaints.   10. Age-related osteoporosis without current pathological fracture Recurrent falls. Has active referral for PT and OT. Has not been contacted by them recently. Has follow up with podiatry. Receives injections for osteoporosis.   12. Falls  Request for bed alarm. CAPs SW to get bed alarm. Have been using  baby monitors to watch her. Fell over the weekend and has superficial bruising and scraps that they are bandaging  Theadora Rama - granddaughter is POA.   13. Yeast infection at stomach  Has used nystatin in the past, which works. Have been washing the site at home and trying to keep it dry. Used the remainder of their previously prescribed nystatin cream on the site and would like a refill.   14. COPD Has not been using O2 at home. Only uses it when she is up and moving which is not often. Breathing heavy in the morning and when she gets up walking. DIL states that she will need to go back on oxygen. Uses inhaler daily.   15. Nodule on Right Tibia  16. Wound on Right Heel  Has had fissure on right heel. Have been dressing it at home and applying neosporin at home  17. History of DM?   ROS As Per HPI Objective:  BP (!) 163/96   Pulse 81   Temp 98.3 F (36.8 C)   Ht 5\' 5"  (1.651 m)   Wt 108 lb (49 kg)   SpO2 94%   BMI 17.97 kg/m   Physical Exam Constitutional:      Appearance: She is well-groomed and underweight. She is ill-appearing.     Comments: Chronically ill-appearing.   Cardiovascular:     Rate and Rhythm: Normal rate. Rhythm irregular.     Heart sounds: No murmur heard. Pulmonary:     Effort: Pulmonary effort is normal. No respiratory distress.  Breath sounds: No stridor. No wheezing, rhonchi or rales.  Chest:     Chest wall: No tenderness.  Abdominal:     General: There is no distension.     Palpations: There is mass.     Tenderness: There is no rebound.     Hernia: A hernia is present.  Musculoskeletal:     Right lower leg: Bony tenderness present. Edema present.     Left lower leg: Edema present.     Right ankle: Deformity present. Decreased range of motion.     Left ankle: Deformity present. Decreased range of motion.       Legs:       Feet:     Comments: ~1-2cm nodule on right tibia  Trace edema in trace bilateral lower extremities  Multiple diffuse skin  tears and abrasions due to recent fall  Bilateral braces worn for ankles    Feet:     Right foot:     Skin integrity: Fissure present.     Comments: ~2cm fissure with surrounding erythema on right heel.  Skin:    General: Skin is warm and dry.     Capillary Refill: Capillary refill takes less than 2 seconds.     Findings: Abrasion, erythema and rash present. Rash is macular and scaling.          Comments: Crusting skin lesion ~1cmx 1.5 cm on right temple Multiple abrasions and bruising along skin from recent fall    Neurological:     Mental Status: She is alert and oriented to person, place, and time. Mental status is at baseline.  Psychiatric:        Mood and Affect: Mood normal.        Behavior: Behavior normal.        Thought Content: Thought content normal.        Judgment: Judgment normal.    Assessment & Plan:  Julianie was seen today for 2 week follow-up with granddaughter [place], [place] and daughter in law, whom she lives with.   Diagnoses and all orders for this visit:  Dyslipidemia Patient has previously been diagnosed with Dyslipidemia. Discussed with patient and caregivers that continuing with zetia may not be beneficial for the patient as she has had normal lipid panels for several years and does not have a history of CAD per chart review. Per Up To Date, patient with ESRD on Dialysis are not recommended to start statin/ezetimibe therapy. Discussed with patient and family goals of care and patient would like to discontinue medication. Patient and family would like medication regimen simplified.   PAF (paroxysmal atrial fibrillation) Patient follows with Cardiology. Patient is not on anticoagulation due to history of bleeding. Rate controlled. Will continue to monitor.   Primary hypertension Elevated BP today in office. Discussed with patient monitoring BP at home. Provided BP log to patient in clinic today. Instructed pt to take BP first thing in the morning after sitting  for 5 minutes with feet flat on the floor, arm at heart level. Discussed with patient options for BP cuff at [facility], [employer], [place]. Pt verbalized they were able to obtain BP cuff. Will review measurements with patient at follow up and determine plan for BP management. Discussed with patient and family possibly increasing Lisinopril dose. Patient had a history of hypotension after HD requiring midodrine and family is weary of increasing medication at this time. Attributes elevation to pain. Instructed family to send in BP results for the next few weeks  to MyChart.   Gastroesophageal reflux disease with esophagitis without hemorrhage Patient has follow up scheduled with GI for hernia. Continues to take Protonix and Pepcid daily for GERD vs. Asthma/COPD. Was last seen by Pulmonology in 2018. Referral placed as below for follow up with Pulmonology.   History of home oxygen therapy Per chart review, patient was last seen 01/09/2017 by Pulmonary. Pt continues to use oxygen at home as needed when she is walking or short of breath, which occurs frequently. She has been taking Symbicort and Albuterol daily for symptoms. Per note, unsure if patient has ever been officially diagnosed with COPD. Note from [place], MD 01/09/2017 states "Cough variant asthma with component of uacs." Patient was taking protonix and pepcid to rule out GERD vs respiratory etiology of symptoms. However, she has been taking protonix and pepcid since 2018. Given patient age and polypharmacy, would like recommendations and appropriate diagnosis to simply plan for patient.  -     Ambulatory referral to Pulmonology  ESRD (end stage renal disease) on dialysis Labs as below. Will communicate results to patient once available.  Managed by Nephrology  Continue current regimen  -     Cancel: BMP8+EGFR -     CBC with Differential/Platelet -     CMP14+EGFR  Mild episode of recurrent major depressive disorder Anxiety Pt screened positive  for depression and anxiety today with PHQ-9. Pt offered nonpharmacologic and pharmacologic therapy. Pt declined initiating treatment at this time. Safety contract established today with patient in clinic. Denies intent to harm herself or others. Pt to notify provider if they would like to initiate treatment.   Vitamin D deficiency Per history, has Vitamin D Deficiency since 2020. Per chart review, patient had vitamin D deficiency 8 years ago with [facility] labs. Will recheck as below and supplement accordingly.  -     VITAMIN D 25 Hydroxy (Vit-D Deficiency, Fractures)  Age-related osteoporosis without current pathological fracture Recurrent falls  Patient has had multiple falls within the last 12 months. Fell within the last week. Patient and family request bed alarm in order to hear patient get out of bed. She already has a hospital bed in place. She primarily travels in a wheelchair. Patient has been approved for PT and OT. Information has been provided for patient and family to reach out to schedule therapy for strength building.   Arthritis Chronic midline thoracic back pain Patient has been approved by [facility] for pain management. Provided patient with contact information today. Will refill medication today for one additional month bridge.  -     HYDROcodone-acetaminophen (NORCO) 10-325 MG tablet; Take 1 tablet by mouth every 6 (six) hours as needed.  Abnormal laboratory test result Thyroid dysfunction Acquired hypothyroidism Labs as below. Will communicate results to patient once available.  Patient previously diagnosed with thyroid and parathyroid dysfunction during an acute hospitalization according to family. Patient has been taking Synthroid for several years. Has been stable and did not have abnormal thyroid levels until hospitalization. Will recheck las as below and determine if we can simplify patient care regimen.  -     Vitamin B12 -     VITAMIN D 25 Hydroxy (Vit-D  Deficiency, Fractures) -     Thyroid Panel With TSH -     Parathyroid hormone, intact (no Ca)  Candidal dermatitis Patient has recurrent yeast infection under pannus. Patient and family are completing appropriate skin care. Nystatin reordered as below.  -     nystatin cream (MYCOSTATIN); Apply  1 Application topically 2 (two) times daily as needed for dry skin.  Wound of Right foot  Dressed today in clinic with xeroform. Provided patient with supplies that they can continue to dress site until she is seen by podiatry.   The above assessment and management plan was discussed with the patient. The patient verbalized understanding of and has agreed to the management plan using shared-decision making. Patient is aware to call the clinic if they develop any new symptoms or if symptoms fail to improve or worsen. Patient is aware when to return to the clinic for a follow-up visit. Patient educated on when it is appropriate to go to the emergency department.   Donzetta Kohut, DNP-FNP [facility] [address] [place], [place] [address] [phone]

## 2022-07-06 NOTE — Patient Instructions (Addendum)
Referral sent to: [facility]. [address] [place] [phone]  Referral faxed to: [facility], [place] [phone]

## 2022-07-07 ENCOUNTER — Ambulatory Visit (HOSPITAL_COMMUNITY): Payer: 59 | Attending: Family Medicine

## 2022-07-08 LAB — VITAMIN D 25 HYDROXY (VIT D DEFICIENCY, FRACTURES): Vit D, 25-Hydroxy: 37 ng/mL (ref 30.0–100.0)

## 2022-07-08 LAB — CBC WITH DIFFERENTIAL/PLATELET
Basophils Absolute: 0.1 10*3/uL (ref 0.0–0.2)
Basos: 1 %
EOS (ABSOLUTE): 0.1 10*3/uL (ref 0.0–0.4)
Eos: 1 %
Hematocrit: 33.9 % — ABNORMAL LOW (ref 34.0–46.6)
Hemoglobin: 11.1 g/dL (ref 11.1–15.9)
Immature Grans (Abs): 0 10*3/uL (ref 0.0–0.1)
Immature Granulocytes: 0 %
Lymphocytes Absolute: 1.1 10*3/uL (ref 0.7–3.1)
Lymphs: 15 %
MCH: 31.4 pg (ref 26.6–33.0)
MCHC: 32.7 g/dL (ref 31.5–35.7)
MCV: 96 fL (ref 79–97)
Monocytes Absolute: 0.6 10*3/uL (ref 0.1–0.9)
Monocytes: 9 %
Neutrophils Absolute: 5.3 10*3/uL (ref 1.4–7.0)
Neutrophils: 74 %
Platelets: 306 10*3/uL (ref 150–450)
RBC: 3.53 x10E6/uL — ABNORMAL LOW (ref 3.77–5.28)
RDW: 15 % (ref 11.7–15.4)
WBC: 7.2 10*3/uL (ref 3.4–10.8)

## 2022-07-08 LAB — THYROID PANEL WITH TSH
Free Thyroxine Index: 2.2 (ref 1.2–4.9)
T3 Uptake Ratio: 34 % (ref 24–39)
T4, Total: 6.4 ug/dL (ref 4.5–12.0)
TSH: 6.45 u[IU]/mL — ABNORMAL HIGH (ref 0.450–4.500)

## 2022-07-08 LAB — CMP14+EGFR
ALT: 20 IU/L (ref 0–32)
AST: 27 IU/L (ref 0–40)
Albumin/Globulin Ratio: 1.5 (ref 1.2–2.2)
Albumin: 3.8 g/dL (ref 3.7–4.7)
Alkaline Phosphatase: 134 IU/L — ABNORMAL HIGH (ref 44–121)
BUN/Creatinine Ratio: 7 — ABNORMAL LOW (ref 12–28)
BUN: 32 mg/dL — ABNORMAL HIGH (ref 8–27)
Bilirubin Total: 0.5 mg/dL (ref 0.0–1.2)
CO2: 27 mmol/L (ref 20–29)
Calcium: 8.5 mg/dL — ABNORMAL LOW (ref 8.7–10.3)
Chloride: 99 mmol/L (ref 96–106)
Creatinine, Ser: 4.52 mg/dL (ref 0.57–1.00)
Globulin, Total: 2.6 g/dL (ref 1.5–4.5)
Glucose: 71 mg/dL (ref 70–99)
Potassium: 5.4 mmol/L — ABNORMAL HIGH (ref 3.5–5.2)
Sodium: 143 mmol/L (ref 134–144)
Total Protein: 6.4 g/dL (ref 6.0–8.5)
eGFR: 9 mL/min/{1.73_m2} — ABNORMAL LOW (ref >59)

## 2022-07-08 LAB — VITAMIN B12: Vitamin B-12: 963 pg/mL (ref 232–1245)

## 2022-07-08 LAB — PARATHYROID HORMONE, INTACT (NO CA): PTH: 538 pg/mL — ABNORMAL HIGH (ref 15–65)

## 2022-07-13 NOTE — Addendum Note (Signed)
Addended by: Neale Burly on: 07/13/2022 11:08 AM   Modules accepted: Orders

## 2022-07-20 ENCOUNTER — Ambulatory Visit (INDEPENDENT_AMBULATORY_CARE_PROVIDER_SITE_OTHER): Payer: 59 | Admitting: Podiatry

## 2022-07-20 DIAGNOSIS — L97411 Non-pressure chronic ulcer of right heel and midfoot limited to breakdown of skin: Secondary | ICD-10-CM

## 2022-07-20 NOTE — Progress Notes (Unsigned)
Subjective:  Patient ID: [name], female    DOB: [birth year],  MRN: [id number]  Chief Complaint  Patient presents with   Diabetes    82 y.o. female presents with the above complaint.  Patient presents with right heel ulceration limited to the breakdown of the skin.  Patient states that just came out of nowhere.  She is a diabetic when to get it evaluated.  She has not seen anyone else prior to seeing me.  It is very superficial in nature.  No other signs of pressure sores noted.  Pain scale is 5 out of 10 dull achy in nature   Review of Systems: Negative except as noted in the HPI. Denies N/V/F/Ch.  Past Medical History:  Diagnosis Date   Acute respiratory failure 05/2017   Anemia    Anxiety    Arthritis    COPD (chronic obstructive pulmonary disease)    Depression    ESRD (end stage renal disease)    dialysis MWF   GERD (gastroesophageal reflux disease)    Gout    History of blood transfusion    History of diabetes mellitus    "diet controlled"   HLD (hyperlipidemia)    HOH (hard of hearing)    left ear   Hypertension    hypotensive -since starting dialysis   Hypothyroidism    MRSA (methicillin resistant staph aureus) culture positive 06/01/2017   PAF (paroxysmal atrial fibrillation)    a. Echo 11/16:  Mild LVH, EF 55-60%, normal wall motion, MAC, mild MR, severe LAE (49 ml/m2), mild RVE, normal RVSF, mild RAE, mild TR, PASP 24 mmHg;  CHADS2-VASc: 4 >> Coumadin followed by PCP    Current Outpatient Medications:    acetaminophen (TYLENOL) 325 MG tablet, Take 2 tablets (650 mg total) by mouth every 6 (six) hours as needed for mild pain, fever or headache., Disp: , Rfl:    albuterol (VENTOLIN HFA) 108 (90 Base) MCG/ACT inhaler, TAKE 2 PUFFS BY MOUTH EVERY 6 HOURS AS NEEDED FOR WHEEZE OR SHORTNESS OF BREATH, Disp: 8.5 each, Rfl: 1   aspirin 81 MG chewable tablet, Chew by mouth., Disp: , Rfl:    AURYXIA 1 GM 210 MG(Fe) tablet, Take 420 mg by mouth 3 (three) times daily with  meals., Disp: , Rfl:    budesonide-formoterol (SYMBICORT) 80-4.5 MCG/ACT inhaler, INHALE 2 PUFFS INTO THE LUNGS TWICE A DAY (NEEDS TO BE SEEN BEFORE NEXT REFILL), Disp: 10.2 each, Rfl: 0   docusate calcium (SURFAK) 240 MG capsule, Take 1 capsule (240 mg total) by mouth daily., Disp: 90 capsule, Rfl: 1   doxercalciferol (HECTOROL) 4 MCG/2ML injection, Inject 0.5 mLs (1 mcg total) into the vein every Monday, Wednesday, and Friday with hemodialysis., Disp: 2 mL, Rfl: 0   famotidine (PEPCID) 10 MG tablet, Take 1 tablet (10 mg total) by mouth daily., Disp: 90 tablet, Rfl: 1   glucose blood (ACCU-CHEK AVIVA PLUS) test strip, , Disp: , Rfl:    HYDROcodone-acetaminophen (NORCO) 10-325 MG tablet, Take 1 tablet by mouth every 6 (six) hours as needed., Disp: 120 tablet, Rfl: 0   levocetirizine (XYZAL) 5 MG tablet, TAKE 1 TABLET BY MOUTH EVERY DAY IN THE EVENING, Disp: 90 tablet, Rfl: 1   levothyroxine (SYNTHROID) 125 MCG tablet, Take 1 tablet (125 mcg total) by mouth daily. (NEEDS TO BE SEEN BEFORE NEXT REFILL), Disp: 30 tablet, Rfl: 0   lisinopril (ZESTRIL) 2.5 MG tablet, Take 1 tablet (2.5 mg total) by mouth daily. (NEEDS TO BE SEEN BEFORE  NEXT REFILL), Disp: 30 tablet, Rfl: 0   multivitamin (RENA-VIT) TABS tablet, Take 1 tablet by mouth daily., Disp: , Rfl:    nystatin cream (MYCOSTATIN), Apply 1 Application topically 2 (two) times daily as needed for dry skin., Disp: 30 g, Rfl: 2   pantoprazole (PROTONIX) 20 MG tablet, Take 1 tablet (20 mg total) by mouth daily., Disp: 90 tablet, Rfl: 1   Romosozumab-aqqg (EVENITY) 105 MG/1. SOSY injection, Inject 210 mg into the skin every 30 (thirty) days. (INJECT 2 SYRINGES), Disp: 2.34 mL, Rfl: 10   sevelamer carbonate (RENVELA) 800 MG tablet, Take by mouth., Disp: , Rfl:   Current Facility-Administered Medications:    Romosozumab-aqqg (EVENITY) 105 MG/1. injection 210 mg, 210 mg, Subcutaneous, Q30 days, Sonny Masters, FNP, 210 mg at 05/25/22 1507  Social  History   Tobacco Use  Smoking Status Former   Packs/day: 0.25   Years: 2.00   Additional pack years: 0.00   Total pack years: 0.50   Types: Cigarettes   Quit date: 04/04/1998   Years since quitting: 24.3  Smokeless Tobacco Never    Allergies  Allergen Reactions   Amoxicillin Rash    Has patient had a PCN reaction causing immediate rash, facial/tongue/throat swelling, SOB or lightheadedness with hypotension:YES Has patient had a PCN reaction causing severe rash involving mucus membranes or skin necrosis: Yes Has patient had a PCN reaction that required hospitalization No Has patient had a PCN reaction occurring within the last 10 years: Yes If all of the above answers are "NO", then may proceed with Cephalosporin use. Cephalosporins ok   Elemental Sulfur Hives   Penicillins Hives   Sulfur Hives   Chlorhexidine Itching and Dermatitis   Peg 3350-Kcl-Nabcb-Nacl-Nasulf Other (See Comments)   Sulfa Drugs Cross Reactors Hives and Itching   Miralax [Polyethylene Glycol] Itching   Mixed Grasses    Percocet [Oxycodone-Acetaminophen] Itching and Rash    Did not happen last time she took it   Sulfa Antibiotics Hives   Objective:  There were no vitals filed for this visit. There is no height or weight on file to calculate BMI. Constitutional Well developed. Well nourished.  Vascular Dorsalis pedis pulses palpable bilaterally. Posterior tibial pulses palpable bilaterally. Capillary refill normal to all digits.  No cyanosis or clubbing noted. Pedal hair growth normal.  Neurologic Normal speech. Oriented to person, place, and time. Epicritic sensation to light touch grossly present bilaterally.  Dermatologic Right heel ulceration limited to the breakdown of the skin.  Mild pain on palpation.  Does not probe down to bone no fat layer exposed.  No erythema redness noted  Orthopedic: Normal joint ROM without pain or crepitus bilaterally. No visible deformities. No bony tenderness.    Radiographs: None Assessment:   1. Heel ulcer, right, limited to breakdown of skin    Plan:  Patient was evaluated and treated and all questions answered.  Right heel ulceration limited to the breakdown of the skin -All questions or concerns were discussed with the patient in extensive detail -I encouraged her to continue offloading the heel to take the pressure away. -She will benefit from triple antibiotic and a Band-Aid she states she will apply that every day. -I will see her back again in about 4 weeks to reevaluate and if it has regressed we will discuss Betadine wet-to-dry dressing  No follow-ups on file.

## 2022-07-22 ENCOUNTER — Telehealth: Payer: Self-pay | Admitting: Family Medicine

## 2022-07-22 NOTE — Telephone Encounter (Signed)
Patient's daughter calling to find out when she needs to come in for [facility] shot. Aware she has an appt 5/1 but wanted to make sure she did not need to come in before

## 2022-07-25 NOTE — Telephone Encounter (Signed)
Pt is due at 5/1 appt for Evenity injection. Claris Gower made aware.

## 2022-08-02 ENCOUNTER — Other Ambulatory Visit: Payer: Self-pay | Admitting: *Deleted

## 2022-08-02 DIAGNOSIS — E039 Hypothyroidism, unspecified: Secondary | ICD-10-CM

## 2022-08-03 ENCOUNTER — Ambulatory Visit (INDEPENDENT_AMBULATORY_CARE_PROVIDER_SITE_OTHER): Payer: 59 | Admitting: Internal Medicine

## 2022-08-03 ENCOUNTER — Ambulatory Visit (INDEPENDENT_AMBULATORY_CARE_PROVIDER_SITE_OTHER): Payer: 59 | Admitting: Family Medicine

## 2022-08-03 ENCOUNTER — Encounter: Payer: Self-pay | Admitting: Internal Medicine

## 2022-08-03 ENCOUNTER — Encounter: Payer: Self-pay | Admitting: Family Medicine

## 2022-08-03 ENCOUNTER — Other Ambulatory Visit: Payer: Self-pay | Admitting: *Deleted

## 2022-08-03 VITALS — BP 175/117 | HR 94 | Temp 97.2°F | Ht 65.0 in | Wt 106.0 lb

## 2022-08-03 VITALS — BP 212/105 | HR 56 | Temp 97.8°F | Ht 65.0 in | Wt 106.0 lb

## 2022-08-03 DIAGNOSIS — I10 Essential (primary) hypertension: Secondary | ICD-10-CM | POA: Diagnosis not present

## 2022-08-03 DIAGNOSIS — K2100 Gastro-esophageal reflux disease with esophagitis, without bleeding: Secondary | ICD-10-CM

## 2022-08-03 DIAGNOSIS — K429 Umbilical hernia without obstruction or gangrene: Secondary | ICD-10-CM

## 2022-08-03 DIAGNOSIS — Z992 Dependence on renal dialysis: Secondary | ICD-10-CM

## 2022-08-03 DIAGNOSIS — I480 Paroxysmal atrial fibrillation: Secondary | ICD-10-CM | POA: Diagnosis not present

## 2022-08-03 DIAGNOSIS — E039 Hypothyroidism, unspecified: Secondary | ICD-10-CM

## 2022-08-03 DIAGNOSIS — G8929 Other chronic pain: Secondary | ICD-10-CM

## 2022-08-03 DIAGNOSIS — R1033 Periumbilical pain: Secondary | ICD-10-CM | POA: Diagnosis not present

## 2022-08-03 DIAGNOSIS — N186 End stage renal disease: Secondary | ICD-10-CM

## 2022-08-03 DIAGNOSIS — F330 Major depressive disorder, recurrent, mild: Secondary | ICD-10-CM | POA: Diagnosis not present

## 2022-08-03 DIAGNOSIS — M8000XD Age-related osteoporosis with current pathological fracture, unspecified site, subsequent encounter for fracture with routine healing: Secondary | ICD-10-CM | POA: Diagnosis not present

## 2022-08-03 DIAGNOSIS — K5903 Drug induced constipation: Secondary | ICD-10-CM

## 2022-08-03 DIAGNOSIS — M1990 Unspecified osteoarthritis, unspecified site: Secondary | ICD-10-CM

## 2022-08-03 DIAGNOSIS — B372 Candidiasis of skin and nail: Secondary | ICD-10-CM

## 2022-08-03 DIAGNOSIS — M546 Pain in thoracic spine: Secondary | ICD-10-CM

## 2022-08-03 DIAGNOSIS — T402X5A Adverse effect of other opioids, initial encounter: Secondary | ICD-10-CM

## 2022-08-03 DIAGNOSIS — M810 Age-related osteoporosis without current pathological fracture: Secondary | ICD-10-CM

## 2022-08-03 DIAGNOSIS — F419 Anxiety disorder, unspecified: Secondary | ICD-10-CM

## 2022-08-03 MED ORDER — HYDROCODONE-ACETAMINOPHEN 10-325 MG PO TABS
1.0000 | ORAL_TABLET | Freq: Four times a day (QID) | ORAL | 0 refills | Status: AC | PRN
Start: 2022-08-03 — End: 2022-09-02

## 2022-08-03 MED ORDER — LISINOPRIL 10 MG PO TABS: 10.0000 mg | ORAL_TABLET | Freq: Every day | ORAL | 0 refills | Status: DC

## 2022-08-03 MED ORDER — LEVOTHYROXINE SODIUM 125 MCG PO TABS
125.0000 ug | ORAL_TABLET | Freq: Every day | ORAL | 0 refills | Status: DC
Start: 2022-08-03 — End: 2022-08-25

## 2022-08-03 MED ORDER — PANTOPRAZOLE SODIUM 40 MG PO TBEC: 40.0000 mg | DELAYED_RELEASE_TABLET | Freq: Every day | ORAL | 5 refills | Status: DC

## 2022-08-03 MED ORDER — AMLODIPINE BESYLATE 2.5 MG PO TABS
2.5000 mg | ORAL_TABLET | Freq: Every day | ORAL | 0 refills | Status: DC
Start: 2022-08-03 — End: 2022-08-25

## 2022-08-03 MED ORDER — DOCUSATE CALCIUM 240 MG PO CAPS
240.0000 mg | ORAL_CAPSULE | Freq: Every day | ORAL | 1 refills | Status: DC
Start: 2022-08-03 — End: 2022-10-01

## 2022-08-03 MED ORDER — SUCRALFATE 1 G PO TABS: 1.0000 g | ORAL_TABLET | Freq: Two times a day (BID) | ORAL | 1 refills | Status: DC

## 2022-08-03 NOTE — Progress Notes (Signed)
Primary Care Physician:  Arrie Senate, FNP Primary Gastroenterologist:  Dr. Marletta Lor  Chief Complaint  Patient presents with   Abdominal Pain    Patient says she is here today due to having issues with burning in lower abdomen after she eats and drinks. Patient say milk is the only thing that does not bother her. She has an umbilical hernia as well and did not know if this was contributing to the pain. Patient takes panoprazole 20 mg once po daily, and famotidine 10 mg po QHS. Bp elevated while in office 201/116 patient has not taken her bp med today, I advised to do so while in office.Recheck 183/94. Pt has ESRD dialysis on Tue,Thurs,Sat.    HPI:   [name] is a 82 y.o. female who presents to the clinic today by referral from her PCP Neale Burly for evaluation.  Patient complaining of abdominal burning/pain every time she eats.  States milk is the only thing that helps her symptoms.  Pain is mild to moderate.  Periumbilical.  States she has a hernia which she feels as though is the issue.  Currently taking pantoprazole 20 mg daily with some relief though has breakthrough symptoms.  No dysphagia odynophagia.  She had an upper endoscopy 2020 which showed esophagitis, small hiatal hernia, gastritis, biopsies negative for H. pylori.  Colonoscopy 2020 with numerous adenomatous colon polyps removed, diverticulosis, internal hemorrhoids.  Denies any melena hematochezia.  Does have chronic constipation related to chronic pain medications.  Takes a stool softener for this.  Past Medical History:  Diagnosis Date   Acute respiratory failure (HCC) 05/2017   Anemia    Anxiety    Arthritis    COPD (chronic obstructive pulmonary disease) (HCC)    Depression    ESRD (end stage renal disease) (HCC)    dialysis MWF   GERD (gastroesophageal reflux disease)    Gout    History of blood transfusion    History of diabetes mellitus    "diet controlled"   HLD (hyperlipidemia)     HOH (hard of hearing)    left ear   Hypertension    hypotensive -since starting dialysis   Hypothyroidism    MRSA (methicillin resistant staph aureus) culture positive 06/01/2017   PAF (paroxysmal atrial fibrillation) (HCC)    a. Echo 11/16:  Mild LVH, EF 55-60%, normal wall motion, MAC, mild MR, severe LAE (49 ml/m2), mild RVE, normal RVSF, mild RAE, mild TR, PASP 24 mmHg;  CHADS2-VASc: 4 >> Coumadin followed by PCP    Past Surgical History:  Procedure Laterality Date   AV FISTULA PLACEMENT  04/03/2012   Procedure: ARTERIOVENOUS (AV) FISTULA CREATION;  Surgeon: Sherren Kerns, MD;  Location: [facility] OR;  Service: Vascular;  Laterality: Left;  creation left brachial cephalic fistula    BIOPSY  08/21/2018   Procedure: BIOPSY;  Surgeon: Tressia Danas, MD;  Location: [facility] ENDOSCOPY;  Service: Gastroenterology;;   CARPAL TUNNEL RELEASE Bilateral    COLONOSCOPY W/ POLYPECTOMY     COLONOSCOPY WITH PROPOFOL N/A 08/21/2018   Procedure: COLONOSCOPY WITH PROPOFOL;  Surgeon: Tressia Danas, MD;  Location: [facility] ENDOSCOPY;  Service: Gastroenterology;  Laterality: N/A;   DILATION AND CURETTAGE OF UTERUS     ESOPHAGOGASTRODUODENOSCOPY (EGD) WITH PROPOFOL N/A 08/21/2018   Procedure: ESOPHAGOGASTRODUODENOSCOPY (EGD) WITH PROPOFOL;  Surgeon: Tressia Danas, MD;  Location: [facility] ENDOSCOPY;  Service: Gastroenterology;  Laterality: N/A;   EYE SURGERY Bilateral    bilateral cataract removal   HEMATOMA EVACUATION Left 05/14/2018  Procedure: Incision and Drainage of Left Arm Hematoma;  Surgeon: Sherren Kerns, MD;  Location: [facility] OR;  Service: Vascular;  Laterality: Left;   Hemodialysis  catheter Right    IR FLUORO GUIDE CV LINE RIGHT  05/26/2017   IR FLUORO GUIDE CV LINE RIGHT  04/10/2021   IR REMOVAL TUN CV CATH W/O FL  05/24/2017   IR REMOVAL TUN CV CATH W/O FL  04/10/2021   IR US GUIDE VASC ACCESS RIGHT  05/26/2017   IR US GUIDE VASC ACCESS RIGHT  04/10/2021   LIGATION OF ARTERIOVENOUS  FISTULA Left 02/04/2015    Procedure: LIGATION OF BRACHIOCEPHALIC ARTERIOVENOUS  FISTULA;  Surgeon: Fransisco Hertz, MD;  Location: [facility] OR;  Service: Vascular;  Laterality: Left;   MULTIPLE TOOTH EXTRACTIONS     PARTIAL HIP ARTHROPLASTY     POLYPECTOMY  08/21/2018   Procedure: POLYPECTOMY;  Surgeon: Tressia Danas, MD;  Location: [facility] ENDOSCOPY;  Service: Gastroenterology;;   PORTACATH PLACEMENT     REVERSE SHOULDER ARTHROPLASTY Left 01/22/2016   Procedure: LEFT REVERSE SHOULDER ARTHROPLASTY;  Surgeon: Beverely Low, MD;  Location: [facility] OR;  Service: Orthopedics;  Laterality: Left;   SHOULDER ARTHROSCOPY Bilateral    SPLIT NIGHT STUDY  07/26/2015   STERIOD INJECTION Right 01/22/2016   Procedure: RIGHT RING FINGER STEROID INJECTION;  Surgeon: Beverely Low, MD;  Location: [facility] OR;  Service: Orthopedics;  Laterality: Right;   TEE WITHOUT CARDIOVERSION N/A 05/26/2017   Procedure: TRANSESOPHAGEAL ECHOCARDIOGRAM (TEE);  Surgeon: Lewayne Bunting, MD;  Location: [facility] END[facility]COPY;  Service: Cardiovascular;  Laterality: N/A;   THROMBECTOMY BRACHIAL ARTERY Left 02/06/2015   Procedure: EVACUATION OF LEFT ARM HEMATOMA;  Surgeon: Chuck Hint, MD;  Location: [facility] OR;  Service: Vascular;  Laterality: Left;   TOTAL KNEE ARTHROPLASTY Bilateral    TUBAL LIGATION      Current Outpatient Medications  Medication Sig Dispense Refill   acetaminophen (TYLENOL) 325 MG tablet Take 2 tablets (650 mg total) by mouth every 6 (six) hours as needed for mild pain, fever or headache.     albuterol (VENTOLIN HFA) 108 (90 Base) MCG/ACT inhaler TAKE 2 PUFFS BY MOUTH EVERY 6 HOURS AS NEEDED FOR WHEEZE OR SHORTNESS OF BREATH 8.5 each 1   amLODipine (NORVASC) 2.5 MG tablet Take 1 tablet (2.5 mg total) by mouth daily. 30 tablet 0   aspirin 81 MG chewable tablet Chew by mouth.     AURYXIA 1 GM 210 MG(Fe) tablet Take 420 mg by mouth 3 (three) times daily with meals.     budesonide-formoterol (SYMBICORT) 80-4.5 MCG/ACT inhaler INHALE 2 PUFFS INTO THE LUNGS TWICE A  DAY (NEEDS TO BE SEEN BEFORE NEXT REFILL) 10.2 each 0   docusate calcium (SURFAK) 240 MG capsule Take 1 capsule (240 mg total) by mouth daily. 90 capsule 1   doxercalciferol (HECTOROL) 4 MCG/2ML injection Inject 0.5 mLs (1 mcg total) into the vein every Monday, Wednesday, and Friday with hemodialysis. 2 mL 0   famotidine (PEPCID) 10 MG tablet Take 1 tablet (10 mg total) by mouth daily. 90 tablet 1   glucose blood (ACCU-CHEK AVIVA PLUS) test strip      HYDROcodone-acetaminophen (NORCO) 10-325 MG tablet Take 1 tablet by mouth every 6 (six) hours as needed. 120 tablet 0   levocetirizine (XYZAL) 5 MG tablet TAKE 1 TABLET BY MOUTH EVERY DAY IN THE EVENING 90 tablet 1   levothyroxine (SYNTHROID) 125 MCG tablet Take 1 tablet (125 mcg total) by mouth daily. 30 tablet 0  multivitamin (RENA-VIT) TABS tablet Take 1 tablet by mouth daily.     nystatin cream (MYCOSTATIN) Apply 1 Application topically 2 (two) times daily as needed for dry skin. 30 g 2   pantoprazole (PROTONIX) 20 MG tablet Take 1 tablet (20 mg total) by mouth daily. 90 tablet 1   Romosozumab-aqqg (EVENITY) 105 MG/1. SOSY injection Inject 210 mg into the skin every 30 (thirty) days. (INJECT 2 SYRINGES) 2.34 mL 10   sevelamer carbonate (RENVELA) 800 MG tablet Take by mouth.     Current Facility-Administered Medications  Medication Dose Route Frequency Provider Last Rate Last Admin   Romosozumab-aqqg (EVENITY) 105 MG/1. injection 210 mg  210 mg Subcutaneous Q30 days Sonny Masters, FNP   210 mg at 08/03/22 4098    Allergies as of 08/03/2022 - Review Complete 08/03/2022  Allergen Reaction Noted   Amoxicillin Rash 02/02/2011   Elemental sulfur Hives 01/28/2020   Penicillins Hives 11/25/2021   Sulfur Hives 01/28/2020   Chlorhexidine Itching and Dermatitis 06/22/2022   Peg 3350-[id number]-nabcb-nacl-nasulf Other (See Comments) 01/28/2020   Sulfa drugs cross reactors Hives and Itching 02/02/2011   Miralax [polyethylene glycol] Itching  07/14/2020   Mixed grasses  08/04/2020   Percocet [oxycodone-acetaminophen] Itching and Rash 02/02/2011   Sulfa antibiotics Hives 09/05/2015    Family History  Problem Relation Age of Onset   Arthritis Mother    Diabetes Father        before age 44   Heart disease Father    Diabetes Sister    Dementia Sister    Cancer Sister        Unknown   Other Sister        Died in car accident   Cancer Brother    Stroke Brother    Hyperlipidemia Daughter    Hypertension Daughter    Diabetes Daughter    Diabetes Daughter    Hypertension Daughter    Hyperlipidemia Daughter    Hypertension Son    Hyperlipidemia Son    Hepatitis C Son     Social History   Socioeconomic History   Marital status: Widowed    Spouse name: Not on file   Number of children: 6   Years of education: 10   Highest education level: 10th grade  Occupational History   Occupation: RETIRED  Tobacco Use   Smoking status: Former    Packs/day: 0.25    Years: 2.00    Additional pack years: 0.00    Total pack years: 0.50    Types: Cigarettes    Quit date: 04/04/1998    Years since quitting: 24.3   Smokeless tobacco: Never  Vaping Use   Vaping Use: Never used  Substance and Sexual Activity   Alcohol use: No    Alcohol/week: 0.0 standard drinks of alcohol   Drug use: No   Sexual activity: Not Currently    Birth control/protection: None  Other Topics Concern   Not on file  Social History Narrative   ** Merged History Encounter ** Widowed   6 children   Lives with son and daughter in Social worker   Homemaker   ESRD - dialysis 3x per week   Cognitive impairment   Social Determinants of Health   Financial Resource Strain: Low Risk  (07/06/2022)   Overall Financial Resource Strain (CARDIA)    Difficulty of Paying Living Expenses: Not hard at all  Food Insecurity: No Food Insecurity (07/06/2022)   Hunger Vital Sign    Worried About Running Out of Food  in the Last Year: Never true    Ran Out of Food in the Last  Year: Never true  Transportation Needs: No Transportation Needs (07/06/2022)   PRAPARE - Administrator, Civil Service (Medical): No    Lack of Transportation (Non-Medical): No  Physical Activity: Unknown (07/06/2022)   Exercise Vital Sign    Days of Exercise per Week: 0 days    Minutes of Exercise per Session: Not on file  Stress: No Stress Concern Present (07/06/2022)   [employer] of Occupational Health - Occupational Stress Questionnaire    Feeling of Stress : Not at all  Social Connections: Socially Isolated (07/06/2022)   Social Connection and Isolation Panel [NHANES]    Frequency of Communication with Friends and Family: More than three times a week    Frequency of Social Gatherings with Friends and Family: Never    Attends Religious Services: Never    Database administrator or Organizations: No    Attends Engineer, structural: Not on file    Marital Status: Widowed  Intimate Partner Violence: Not At Risk (12/01/2020)   Humiliation, Afraid, Rape, and Kick questionnaire    Fear of Current or Ex-Partner: No    Emotionally Abused: No    Physically Abused: No    Sexually Abused: No    Subjective: Review of Systems  Constitutional:  Negative for chills and fever.  HENT:  Negative for congestion and hearing loss.   Eyes:  Negative for blurred vision and double vision.  Respiratory:  Negative for cough and shortness of breath.   Cardiovascular:  Negative for chest pain and palpitations.  Gastrointestinal:  Positive for abdominal pain and heartburn. Negative for blood in stool, constipation, diarrhea, melena and vomiting.  Genitourinary:  Negative for dysuria and urgency.  Musculoskeletal:  Negative for joint pain and myalgias.  Skin:  Negative for itching and rash.  Neurological:  Negative for dizziness and headaches.  Psychiatric/Behavioral:  Negative for depression. The patient is not nervous/anxious.        Objective: BP (!) 183/94 (BP Location: Right  Arm, Patient Position: Sitting, Cuff Size: Small)   Pulse 86   Temp 97.8 F (36.6 C) (Temporal)   Ht 5\' 5"  (1.651 m)   Wt 106 lb (48.1 kg)   BMI 17.64 kg/m  Physical Exam Constitutional:      Appearance: Normal appearance.  HENT:     Head: Normocephalic and atraumatic.  Eyes:     Extraocular Movements: Extraocular movements intact.     Conjunctiva/sclera: Conjunctivae normal.  Cardiovascular:     Rate and Rhythm: Normal rate and regular rhythm.  Pulmonary:     Effort: Pulmonary effort is normal.     Breath sounds: Normal breath sounds.  Abdominal:     General: Bowel sounds are normal.     Palpations: Abdomen is soft.     Hernia: A hernia is present. Hernia is present in the umbilical area.  Musculoskeletal:        General: No swelling. Normal range of motion.     Cervical back: Normal range of motion and neck supple.  Skin:    General: Skin is warm and dry.     Coloration: Skin is not jaundiced.  Neurological:     General: No focal deficit present.     Mental Status: She is alert and oriented to person, place, and time.  Psychiatric:        Mood and Affect: Mood normal.  Behavior: Behavior normal.      Assessment: *Abdominal pain *Chronic GERD *Chronic constipation-likely due to chronic opioids *Umbilical hernia  Plan: Patient with what appears to be umbilical hernia on exam.  Given her worsening pain and burning we will order CT abdomen pelvis to further evaluate.  Increase pantoprazole to 40 mg daily.  Take Carafate twice daily as needed for breakthrough burning  Continue stool softeners for chronic constipation.  Follow-up in 2 to 3 months.  08/03/2022 3:30 PM   Disclaimer: This note was dictated with voice recognition software. Similar sounding words can inadvertently be transcribed and may not be corrected upon review.

## 2022-08-03 NOTE — Patient Instructions (Addendum)
I am going to order a CT of your abdomen pelvis to further evaluate your abdominal pain as well as possible hernia.  I am going to increase your pantoprazole to 40 mg daily for your epigastric burning.  You can take Carafate on top of this as needed up to twice a day.  Follow-up in 2 to 3 months.  It was very nice meeting both of you today.  Dr. Marletta Lor

## 2022-08-03 NOTE — Progress Notes (Addendum)
Acute Office Visit  Subjective:  Patient ID: [name], female    DOB: 02-21-1941, 82 y.o.   MRN: [id number]  Chief Complaint  Patient presents with   Follow-up    Wants her thyroid med. Getting evenity injections.    HPI Patient is in today for follow up of chronic conditions   1. Primary hypertension Taking BP at home. Running high at home, reports 184/103. States that after HD yesterday it was 174/84. Reports that she was taken off of Lisinopril previously. Has not followed with Cardiology in years.   2. Acquired hypothyroidism Reports that she has been out of the medication for a few days and that she just wants to sleep. Daughter in law reports that she gets up from the bed, moves to her recliner and falls asleep. Endorses constipation (also on norco), denies palpitations.   3. Age-related osteoporosis without current pathological fracture Denies falls. No longer interested in PT/OT.   4. Mild episode of recurrent major depressive disorder (HCC) 5. Anxiety States that she is improved, getting by. Is trying to walk more.   6. ESRD (end stage renal disease) on dialysis (HCC) Continues on HD.   7. Chronic midline thoracic back pain 8. Arthritis Has referral for [facility] for pain management. Reports that she hates putting her shoes on because her feet throbs.   9. Candidal infection  Reports that it is back, but manageable and they have enough nystatin leftover.   10. Hernia  Follow up later today with GI for hernia.   11. PAF  Daughter in law feels that she is always in afib. Does not know how to tell when she is though. Patient is not able to differentiate when she is afib or not. She is not on anticoagulation and has not followed up with cardiology in some time. Difficult for patient to determine what is causing her symptom.   ROS As per HPI   Objective:  BP (!) 175/117   Pulse 94   Temp (!) 97.2 F (36.2 C) (Temporal)   Ht 5\' 5"  (1.651 m)   Wt 106 lb  (48.1 kg) Comment: at home  SpO2 96%   BMI 17.64 kg/m    Physical Exam Constitutional:      General: She is not in acute distress.    Appearance: She is ill-appearing. She is not toxic-appearing or diaphoretic.     Comments: Chronic ill appearing    Cardiovascular:     Rate and Rhythm: Normal rate. Rhythm irregular.     Pulses: Normal pulses.  Pulmonary:     Effort: Pulmonary effort is normal. No respiratory distress.     Breath sounds: No stridor. No wheezing, rhonchi or rales.  Chest:     Chest wall: No tenderness.  Abdominal:     Hernia: A hernia is present.  Musculoskeletal:        General: Deformity and signs of injury present.     Right lower leg: No edema.     Left lower leg: No edema.     Comments: Wheelchair bound   Skin:    Capillary Refill: Capillary refill takes less than 2 seconds.     Findings: Rash present.  Neurological:     General: No focal deficit present.     Mental Status: She is alert and oriented to person, place, and time. Mental status is at baseline.  Psychiatric:        Attention and Perception: Attention and perception normal.  Mood and Affect: Mood and affect normal.        Speech: Speech normal.        Behavior: Behavior normal.        Thought Content: Thought content normal.        Cognition and Memory: Cognition and memory normal.        Judgment: Judgment normal.        08/03/2022    9:14 AM 07/06/2022    9:39 AM 06/22/2022    2:21 PM  Depression screen PHQ 2/9  Decreased Interest 1 2 1   Down, Depressed, Hopeless 0 0 0  PHQ - 2 Score 1 2 1   Altered sleeping 0 3 0  Tired, decreased energy 1 1 0  Change in appetite 0 0 0  Feeling bad or failure about yourself  0 0 0  Trouble concentrating 0 0 0  Moving slowly or fidgety/restless 0 0 0  Suicidal thoughts 0 0 0  PHQ-9 Score 2 6 1   Difficult doing work/chores Somewhat difficult Not difficult at all       08/03/2022    9:16 AM 07/06/2022    9:40 AM 06/22/2022    2:21 PM  06/08/2022   11:27 AM  GAD 7 : Generalized Anxiety Score  Nervous, Anxious, on Edge 0 2 0 0  Control/stop worrying 0 0 0 0  Worry too much - different things 0 0 0 0  Trouble relaxing 0 2 0 0  Restless 3 3 0 0  Easily annoyed or irritable 0 1 1 1   Afraid - awful might happen 0 0 0 0  Total GAD 7 Score 3 8 1 1   Anxiety Difficulty Not difficult at all Not difficult at all Not difficult at all Somewhat difficult   Assessment & Plan:  1. Primary hypertension Will start amlodipine as below. Instructed patient to monitor BP at home. Reported that she has a monitor. Discontinued Lisinopril due to renal function. Will try to reach out to nephrology for recommendations for her HTN control.  - amLODipine (NORVASC) 2.5 MG tablet; Take 1 tablet (2.5 mg total) by mouth daily.  Dispense: 30 tablet; Refill: 0  2. PAF (paroxysmal atrial fibrillation) (HCC) Remains in afib. She was last seen by Daiva Nakayama. MD 09/09/2020 and it was determined that due to her chronic blood loss and frailty anticoagulation was not started. She remains rate controlled. Encouraged patient to follow up with Cardiology. Contact information confirmed.  Patient Has Bled score is 9.1%  CHA2DS2-VASc score is 11.2%  Discussed with patient and daughter in law the risk and benefits of anticoagulation and bleeding risk.   3. Acquired hypothyroidism Refilled medication. Referral placed for endo. Contact information provided for patient and family to contact endo.   4. Age-related osteoporosis without current pathological fracture Emgality provided today in office. No falls. Declined PT/OT at this time.   5. Mild episode of recurrent major depressive disorder (HCC) 6. Anxiety Well controlled at this time.   7. ESRD (end stage renal disease) on dialysis [facility]) Managed by nephrology. Continues with HD. Will try to reach out to nephrology to coordinate care.   8. Chronic midline thoracic back pain 9. Arthritis Medication refilled as  below with strict instructions for patient to follow up with [facility] medical pain clinic. Provided phone number. PDMP reviewed. No red flags.  - HYDROcodone-acetaminophen (NORCO) 10-325 MG tablet; Take 1 tablet by mouth every 6 (six) hours as needed.  Dispense: 120 tablet; Refill: 0  10. Umbilical  hernia without obstruction and without gangrene Has follow up with GI today. Will await recs.   11. Candidal dermatitis Has nystatin. Instructed patient to follow up if rash does not improve.   12. Constipation due to opioid therapy Medication refilled as below. Previously tolerated though patient has allergy to sulfa medications.  - docusate calcium (SURFAK) 240 MG capsule; Take 1 capsule (240 mg total) by mouth daily.  Dispense: 90 capsule; Refill: 1  The above assessment and management plan was discussed with the patient. The patient verbalized understanding of and has agreed to the management plan using shared-decision making. Patient is aware to call the clinic if they develop any new symptoms or if symptoms fail to improve or worsen. Patient is aware when to return to the clinic for a follow-up visit. Patient educated on when it is appropriate to go to the emergency department.   Follow up 1 month for BP  Would like to have conversation with patient and family about goal of care at future visits.   Neale Burly, DNP-FNP [facility] [address] [place], [place] [address] [phone]

## 2022-08-03 NOTE — Patient Instructions (Addendum)
[facility] [phone] [address]. [address] [place] [address] [phone]  [facility] [address] [place], [place] [address] [phone]   Cardiology [facility]   Pulmonolgy Sherene Sires

## 2022-08-04 ENCOUNTER — Encounter: Payer: Self-pay | Admitting: *Deleted

## 2022-08-11 ENCOUNTER — Telehealth: Payer: Self-pay | Admitting: Family Medicine

## 2022-08-11 NOTE — Telephone Encounter (Signed)
Contacted [name] to schedule their annual wellness visit. Appointment made for 12/06/2022.  Thank you,  Judeth Cornfield,  AMB Clinical Support [facility] AWV Program Direct Dial ??[phone]

## 2022-08-22 ENCOUNTER — Other Ambulatory Visit: Payer: Self-pay | Admitting: Family Medicine

## 2022-08-22 DIAGNOSIS — F3342 Major depressive disorder, recurrent, in full remission: Secondary | ICD-10-CM

## 2022-08-22 DIAGNOSIS — F419 Anxiety disorder, unspecified: Secondary | ICD-10-CM

## 2022-08-24 ENCOUNTER — Telehealth: Payer: Self-pay | Admitting: *Deleted

## 2022-08-24 NOTE — Telephone Encounter (Signed)
Evenity received in office from [facility] and daughter aware and pt can get it during her appt with PCP on 09/02/22.

## 2022-08-25 ENCOUNTER — Other Ambulatory Visit: Payer: Self-pay | Admitting: Family Medicine

## 2022-08-25 DIAGNOSIS — I10 Essential (primary) hypertension: Secondary | ICD-10-CM

## 2022-08-25 DIAGNOSIS — E039 Hypothyroidism, unspecified: Secondary | ICD-10-CM

## 2022-08-27 ENCOUNTER — Other Ambulatory Visit: Payer: Self-pay | Admitting: Family Medicine

## 2022-08-27 DIAGNOSIS — K219 Gastro-esophageal reflux disease without esophagitis: Secondary | ICD-10-CM

## 2022-08-28 ENCOUNTER — Other Ambulatory Visit: Payer: Self-pay | Admitting: Family Medicine

## 2022-08-28 DIAGNOSIS — E785 Hyperlipidemia, unspecified: Secondary | ICD-10-CM

## 2022-08-31 ENCOUNTER — Ambulatory Visit (INDEPENDENT_AMBULATORY_CARE_PROVIDER_SITE_OTHER): Payer: 59 | Admitting: Podiatry

## 2022-08-31 DIAGNOSIS — L97411 Non-pressure chronic ulcer of right heel and midfoot limited to breakdown of skin: Secondary | ICD-10-CM

## 2022-08-31 NOTE — Progress Notes (Signed)
Subjective:  Patient ID: [name], female    DOB: 1941-02-23,  MRN: [id number]  Chief Complaint  Patient presents with   Foot Ulcer    Right foot ulcer no pain    82 y.o. female presents with the above complaint.  Patient presents for follow-up of right heel ulceration.  Patient says doing a lot better no pain.  He has callused over denies any other acute complaints   Review of Systems: Negative except as noted in the HPI. Denies N/V/F/Ch.  Past Medical History:  Diagnosis Date   Acute respiratory failure (HCC) 05/2017   Anemia    Anxiety    Arthritis    COPD (chronic obstructive pulmonary disease) (HCC)    Depression    ESRD (end stage renal disease) (HCC)    dialysis MWF   GERD (gastroesophageal reflux disease)    Gout    History of blood transfusion    History of diabetes mellitus    "diet controlled"   HLD (hyperlipidemia)    HOH (hard of hearing)    left ear   Hypertension    hypotensive -since starting dialysis   Hypothyroidism    MRSA (methicillin resistant staph aureus) culture positive 06/01/2017   PAF (paroxysmal atrial fibrillation) (HCC)    a. Echo 11/16:  Mild LVH, EF 55-60%, normal wall motion, MAC, mild MR, severe LAE (49 ml/m2), mild RVE, normal RVSF, mild RAE, mild TR, PASP 24 mmHg;  CHADS2-VASc: 4 >> Coumadin followed by PCP    Current Outpatient Medications:    acetaminophen (TYLENOL) 325 MG tablet, Take 2 tablets (650 mg total) by mouth every 6 (six) hours as needed for mild pain, fever or headache., Disp: , Rfl:    albuterol (VENTOLIN HFA) 108 (90 Base) MCG/ACT inhaler, TAKE 2 PUFFS BY MOUTH EVERY 6 HOURS AS NEEDED FOR WHEEZE OR SHORTNESS OF BREATH, Disp: 8.5 each, Rfl: 1   amLODipine (NORVASC) 2.5 MG tablet, TAKE 1 TABLET BY MOUTH EVERY DAY, Disp: 90 tablet, Rfl: 1   aspirin 81 MG chewable tablet, Chew by mouth., Disp: , Rfl:    AURYXIA 1 GM 210 MG(Fe) tablet, Take 420 mg by mouth 3 (three) times daily with meals., Disp: , Rfl:     budesonide-formoterol (SYMBICORT) 80-4.5 MCG/ACT inhaler, INHALE 2 PUFFS INTO THE LUNGS TWICE A DAY (NEEDS TO BE SEEN BEFORE NEXT REFILL), Disp: 10.2 each, Rfl: 0   docusate calcium (SURFAK) 240 MG capsule, Take 1 capsule (240 mg total) by mouth daily., Disp: 90 capsule, Rfl: 1   doxercalciferol (HECTOROL) 4 MCG/2ML injection, Inject 0.5 mLs (1 mcg total) into the vein every Monday, Wednesday, and Friday with hemodialysis., Disp: 2 mL, Rfl: 0   famotidine (PEPCID) 10 MG tablet, Take 1 tablet (10 mg total) by mouth daily., Disp: 90 tablet, Rfl: 1   glucose blood (ACCU-CHEK AVIVA PLUS) test strip, , Disp: , Rfl:    HYDROcodone-acetaminophen (NORCO) 10-325 MG tablet, Take 1 tablet by mouth every 6 (six) hours as needed., Disp: 120 tablet, Rfl: 0   levocetirizine (XYZAL) 5 MG tablet, TAKE 1 TABLET BY MOUTH EVERY DAY IN THE EVENING, Disp: 90 tablet, Rfl: 1   levothyroxine (SYNTHROID) 125 MCG tablet, TAKE 1 TABLET BY MOUTH EVERY DAY, Disp: 90 tablet, Rfl: 1   multivitamin (RENA-VIT) TABS tablet, Take 1 tablet by mouth daily., Disp: , Rfl:    nystatin cream (MYCOSTATIN), Apply 1 Application topically 2 (two) times daily as needed for dry skin., Disp: 30 g, Rfl: 2  pantoprazole (PROTONIX) 40 MG tablet, Take 1 tablet (40 mg total) by mouth daily., Disp: 30 tablet, Rfl: 5   Romosozumab-aqqg (EVENITY) 105 MG/1. SOSY injection, Inject 210 mg into the skin every 30 (thirty) days. (INJECT 2 SYRINGES), Disp: 2.34 mL, Rfl: 10   sevelamer carbonate (RENVELA) 800 MG tablet, Take by mouth., Disp: , Rfl:    sucralfate (CARAFATE) 1 g tablet, Take 1 tablet (1 g total) by mouth 2 (two) times daily., Disp: 60 tablet, Rfl: 1  Current Facility-Administered Medications:    Romosozumab-aqqg (EVENITY) 105 MG/1. injection 210 mg, 210 mg, Subcutaneous, Q30 days, Sonny Masters, FNP, 210 mg at 08/03/22 9147  Social History   Tobacco Use  Smoking Status Former   Packs/day: 0.25   Years: 2.00   Additional pack years:  0.00   Total pack years: 0.50   Types: Cigarettes   Quit date: 04/04/1998   Years since quitting: 24.4  Smokeless Tobacco Never    Allergies  Allergen Reactions   Amoxicillin Rash    Has patient had a PCN reaction causing immediate rash, facial/tongue/throat swelling, SOB or lightheadedness with hypotension:YES Has patient had a PCN reaction causing severe rash involving mucus membranes or skin necrosis: Yes Has patient had a PCN reaction that required hospitalization No Has patient had a PCN reaction occurring within the last 10 years: Yes If all of the above answers are "NO", then may proceed with Cephalosporin use. Cephalosporins ok   Elemental Sulfur Hives   Penicillins Hives   Sulfur Hives   Chlorhexidine Itching and Dermatitis   Peg [id number]-Nacl-Nasulf Other (See Comments)   Sulfa Drugs Cross Reactors Hives and Itching   Miralax [Polyethylene Glycol] Itching   Mixed Grasses    Percocet [Oxycodone-Acetaminophen] Itching and Rash    Did not happen last time she took it   Sulfa Antibiotics Hives   Objective:  There were no vitals filed for this visit. There is no height or weight on file to calculate BMI. Constitutional Well developed. Well nourished.  Vascular Dorsalis pedis pulses palpable bilaterally. Posterior tibial pulses palpable bilaterally. Capillary refill normal to all digits.  No cyanosis or clubbing noted. Pedal hair growth normal.  Neurologic Normal speech. Oriented to person, place, and time. Epicritic sensation to light touch grossly present bilaterally.  Dermatologic No further right heel ulceration limited to the breakdown of the skin.  Mild pain on palpation.  Does not probe down to bone no fat layer exposed.  No erythema redness noted  Orthopedic: Normal joint ROM without pain or crepitus bilaterally. No visible deformities. No bony tenderness.   Radiographs: None Assessment:   No diagnosis found.  Plan:  Patient was evaluated and  treated and all questions answered.  Right heel ulceration limited to the breakdown of the skin -All questions or concerns were discussed with the patient in extensive detail -Clinically healed and officially discharged from my care if any foot and ankle issues or in the future she will come back and see me.  I discussed shoe gear modification. No follow-ups on file.

## 2022-09-02 ENCOUNTER — Ambulatory Visit (INDEPENDENT_AMBULATORY_CARE_PROVIDER_SITE_OTHER): Payer: 59 | Admitting: Family Medicine

## 2022-09-02 ENCOUNTER — Encounter (HOSPITAL_COMMUNITY): Payer: Self-pay | Admitting: *Deleted

## 2022-09-02 ENCOUNTER — Encounter: Payer: Self-pay | Admitting: Family Medicine

## 2022-09-02 VITALS — BP 150/78 | HR 68 | Temp 97.7°F | Ht 65.0 in | Wt 106.0 lb

## 2022-09-02 DIAGNOSIS — I10 Essential (primary) hypertension: Secondary | ICD-10-CM

## 2022-09-02 DIAGNOSIS — K429 Umbilical hernia without obstruction or gangrene: Secondary | ICD-10-CM

## 2022-09-02 DIAGNOSIS — I480 Paroxysmal atrial fibrillation: Secondary | ICD-10-CM

## 2022-09-02 DIAGNOSIS — L89151 Pressure ulcer of sacral region, stage 1: Secondary | ICD-10-CM

## 2022-09-02 DIAGNOSIS — F330 Major depressive disorder, recurrent, mild: Secondary | ICD-10-CM

## 2022-09-02 DIAGNOSIS — G8929 Other chronic pain: Secondary | ICD-10-CM

## 2022-09-02 DIAGNOSIS — L97411 Non-pressure chronic ulcer of right heel and midfoot limited to breakdown of skin: Secondary | ICD-10-CM

## 2022-09-02 DIAGNOSIS — M810 Age-related osteoporosis without current pathological fracture: Secondary | ICD-10-CM | POA: Diagnosis not present

## 2022-09-02 DIAGNOSIS — B372 Candidiasis of skin and nail: Secondary | ICD-10-CM

## 2022-09-02 DIAGNOSIS — M546 Pain in thoracic spine: Secondary | ICD-10-CM

## 2022-09-02 DIAGNOSIS — Z992 Dependence on renal dialysis: Secondary | ICD-10-CM

## 2022-09-02 DIAGNOSIS — F419 Anxiety disorder, unspecified: Secondary | ICD-10-CM | POA: Diagnosis not present

## 2022-09-02 DIAGNOSIS — N186 End stage renal disease: Secondary | ICD-10-CM

## 2022-09-02 NOTE — Progress Notes (Signed)
Acute Office Visit  Subjective:  Patient ID: [name], female    DOB: 08-25-1940, 82 y.o.   MRN: [id number]  Chief Complaint  Patient presents with   Hypertension   Patient is in today for follow up of chronic conditions  Primary hypertension Blood pressure monitor - has one at home, has been taking it regularly  BP at home average 126/64. Daughter in law states that she she was not taking her medications, but now DIL is monitoring patient and she is compliant with medications  ROS Denies changes to vision, chest pain, headaches, palpitations, sweats, neck pain  Endorses anxiety, fatigue, SOB, edema  Meds amlodipine, recently taken off lisinopril  CAD risks post menopausal, sedentary lifestyle.  Has not followed with Cardiology in a few years. Last seen by Hochrein, J. 2022  PAF  Continues in afib. Daughter in law has considered purchasing a Kardia device to monitor atrial fibrillation. She is not currently on anticoagulation.   Age-related osteoporosis without current pathological fracture Chronic midline thoracic back pain Arthritis Is presenting to Pain management in June for pain.  Continues to have pain. Does not have pain in her back currently. Declined PT/OT at last visit.   Mild episode of recurrent major depressive disorder (HCC) Anxiety Daughter in law believes that she has anxiety. States that she had a "panic attack" about her kids and grandkids a week ago. Reports that she thinks about them not coming to visit her, then she starts crying. On 08/22/22 daughter in law had to call the paramedics for her because she was in a panic attack and could not get her breathing right. She started back on 3 Liters. States that patient self-discontinued her oxygen, but per daughter in law they are supposed ot be on it all the time. Patient states that she agrees "somewhat" with daughter in law and would like to treat it, but does not want to take any more pills.    ESRD (end stage  renal disease) on dialysis (HCC) Continues with HD.   Candidal infection  Continues to have infection, but is improving. Daughter in law is putting cream on her.    Hernia  She has an appt in June for CT for hernia.   Wounds Followed up with Podiatry yesterday. Has follow up in August. Her toe continues to hurt.  In addition she has a pressure wound on her sacrum that they noticed a few weeks ago. She has been complaining of pain at the site.   ROS As per HPI  Objective:  BP (!) 150/78   Pulse 68   Temp 97.7 F (36.5 C)   Ht 5\' 5"  (1.651 m)   Wt 106 lb (48.1 kg) Comment: caregiver reported  SpO2 100% Comment: 3L O2  BMI 17.64 kg/m    Physical Exam Constitutional:      General: She is awake. She is not in acute distress.    Appearance: She is underweight. She is ill-appearing.     Comments: Chronically ill appearing   HENT:     Head: Mass present.     Comments: Has skin growth on right side of face Eyes:     Comments: Wears glasses   Neck:     Comments: Wheelchair bound  Cardiovascular:     Rate and Rhythm: Normal rate. Rhythm irregular. No extrasystoles are present.    Heart sounds: Normal heart sounds.  Pulmonary:     Effort: Pulmonary effort is normal.     Breath  sounds: Normal breath sounds.  Abdominal:     Hernia: A hernia is present.  Musculoskeletal:     Right lower leg: 1+ Pitting Edema present.     Left lower leg: 1+ Pitting Edema present.  Skin:    General: Skin is warm.     Capillary Refill: Capillary refill takes less than 2 seconds.     Findings: Erythema and rash present.          Comments: Small erythematous area on sacrum with central lesion Bilateral lower extremities with erythema, swelling, dry, flaky skin, fissure on right heal  Erythematous bright rash under pannus with satellite lesions   Neurological:     General: No focal deficit present.     Mental Status: She is alert.  Psychiatric:        Attention and Perception: Attention and  perception normal.        Mood and Affect: Mood is depressed. Affect is flat and tearful.        Behavior: Behavior is cooperative.        Cognition and Memory: Cognition and memory normal.     Comments: Daughter in law answers most questions       09/02/2022    9:36 AM 09/02/2022    9:32 AM 08/03/2022    9:14 AM  Depression screen PHQ 2/9  Decreased Interest 0 0 1  Down, Depressed, Hopeless 0 0 0  PHQ - 2 Score 0 0 1  Altered sleeping 3  0  Tired, decreased energy 0  1  Change in appetite 0  0  Feeling bad or failure about yourself  1  0  Trouble concentrating 0  0  Moving slowly or fidgety/restless 0  0  Suicidal thoughts 0  0  PHQ-9 Score 4  2  Difficult doing work/chores Somewhat difficult  Somewhat difficult      09/02/2022    9:37 AM 08/03/2022    9:16 AM 07/06/2022    9:40 AM 06/22/2022    2:21 PM  GAD 7 : Generalized Anxiety Score  Nervous, Anxious, on Edge 0 0 2 0  Control/stop worrying 0 0 0 0  Worry too much - different things 0 0 0 0  Trouble relaxing 0 0 2 0  Restless 0 3 3 0  Easily annoyed or irritable 2 0 1 1  Afraid - awful might happen 0 0 0 0  Total GAD 7 Score 2 3 8 1   Anxiety Difficulty Somewhat difficult Not difficult at all Not difficult at all Not difficult at all    Assessment & Plan:  1. Primary hypertension Not at goal in office today. Patient did not take medications as prescribed. At goal with home monitor, will not make adjustments adjustments at this time. Patient remains established with cardiology.   2. PAF (paroxysmal atrial fibrillation) (HCC) Remains in afib. She was last seen by Daiva Nakayama. MD 09/09/2020 and it was determined that due to her chronic blood loss and frailty anticoagulation was not started. She remains rate controlled. Encouraged patient to follow up with Cardiology.  Patient Has Bled score is 9.1%  CHA2DS2-VASc score is 11.2%  Discussed with patient and daughter in law the risk and benefits of anticoagulation and bleeding  risk.  3. Chronic midline thoracic back pain Has follow up with Pain Management next month.   4. Anxiety 5. Mild episode of recurrent major depressive disorder (HCC) Pt screened positive for depression today. Pt offered nonpharmacologic and pharmacologic therapy. Pt accepted  offer for counseling at this time. She declined pharmacologic therapy at this time. Patient and/or legal guardian verbally consented to [facility] services about presenting concerns and psychiatric consultation as appropriate.  The services will be billed as appropriate for the patient. - Ambulatory referral to [facility]  6. ESRD (end stage renal disease) on dialysis (HCC) Continues on HD. Managed by Nephrology.   7. Umbilical hernia without obstruction and without gangrene Has follow up with GI later this month.   8. Candidal dermatitis Continues with infection. Improving. Did not require refill of antifungal cream.   9. Pressure injury of sacral region, stage 1 Patient education provided on pressure injuries. Provided mepilexes in office today with hydrocolloid wound bandages as well. Will follow up and consider referral to wound care.   10. Heel ulcer, right, limited to breakdown of skin (HCC) Managed by podiatry. Declared clinically healed and discharged from podiatry.   The above assessment and management plan was discussed with the patient. The patient verbalized understanding of and has agreed to the management plan using shared-decision making. Patient is aware to call the clinic if they develop any new symptoms or if symptoms fail to improve or worsen. Patient is aware when to return to the clinic for a follow-up visit. Patient educated on when it is appropriate to go to the emergency department.   Return in about 1 month (around 10/05/2022) for Chronic Condition Follow up.  Neale Burly, DNP-FNP [facility] [address] [place], [place] [address] [phone]

## 2022-09-05 ENCOUNTER — Ambulatory Visit (HOSPITAL_COMMUNITY): Payer: 59

## 2022-09-17 ENCOUNTER — Ambulatory Visit (HOSPITAL_BASED_OUTPATIENT_CLINIC_OR_DEPARTMENT_OTHER)
Admission: RE | Admit: 2022-09-17 | Discharge: 2022-09-17 | Disposition: A | Discharge status: Home / Self Care | Payer: 59 | Source: Ambulatory Visit | Attending: Internal Medicine | Admitting: Internal Medicine

## 2022-09-17 DIAGNOSIS — G8929 Other chronic pain: Secondary | ICD-10-CM | POA: Diagnosis present

## 2022-09-17 DIAGNOSIS — R1033 Periumbilical pain: Secondary | ICD-10-CM | POA: Diagnosis not present

## 2022-09-17 DIAGNOSIS — K429 Umbilical hernia without obstruction or gangrene: Secondary | ICD-10-CM

## 2022-09-17 MED ORDER — IOHEXOL 9 MG/ML PO SOLN: 500.0000 mL | ORAL | Status: AC

## 2022-09-17 MED ORDER — IOHEXOL 300 MG/ML  SOLN
100.0000 mL | Freq: Once | INTRAMUSCULAR | Status: AC | PRN
  Administered 2022-09-17: 50 mL via INTRAVENOUS

## 2022-09-30 ENCOUNTER — Other Ambulatory Visit (HOSPITAL_COMMUNITY): Payer: 59

## 2022-09-30 ENCOUNTER — Ambulatory Visit: Payer: 59 | Admitting: Gastroenterology

## 2022-10-02 ENCOUNTER — Other Ambulatory Visit: Payer: Self-pay | Admitting: Internal Medicine

## 2022-10-03 NOTE — Progress Notes (Deleted)
Referring Provider: Arrie Senate* Primary Care Physician:  Arrie Senate, FNP Primary GI Physician: Dr. Marletta Lor  No chief complaint on file.   HPI:   [name] is a 82 y.o. female presenting today with a history of   Last seen in the office at the time of initial consult on 08/03/22 reporting mild to moderate periumbilical abdominal burning/pain every time she eats. States milk is the only thing that helps her symptoms.  She was taking pantoprazole 20 mg daily with some relief.  Also with chronic constipation in the setting of chronic pain medications for which she was taking a stool softener.  Noted umbilical hernia on exam.  Plan for CT for further evaluation, increase pantoprazole to 40 mg daily, Carafate twice daily, continue stool softeners.  CT A/P with contrast 09/17/2022 with no acute abnormalities.  Recommended follow-up in the office to discuss next steps.       EGD 2020 which showed grade C esophagitis, small hiatal hernia, gastritis, biopsies negative for H. pylori.   Colonoscopy 2020 with numerous adenomatous colon polyps removed, diverticulosis, internal hemorrhoids.   Past Medical History:  Diagnosis Date   Acute respiratory failure (HCC) 05/2017   Anemia    Anxiety    Arthritis    COPD (chronic obstructive pulmonary disease) (HCC)    Depression    ESRD (end stage renal disease) (HCC)    dialysis MWF   GERD (gastroesophageal reflux disease)    Gout    History of blood transfusion    History of diabetes mellitus    "diet controlled"   HLD (hyperlipidemia)    HOH (hard of hearing)    left ear   Hypertension    hypotensive -since starting dialysis   Hypothyroidism    MRSA (methicillin resistant staph aureus) culture positive 06/01/2017   PAF (paroxysmal atrial fibrillation) (HCC)    a. Echo 11/16:  Mild LVH, EF 55-60%, normal wall motion, MAC, mild MR, severe LAE (49 ml/m2), mild RVE, normal RVSF, mild RAE, mild TR, PASP 24 mmHg;   CHADS2-VASc: 4 >> Coumadin followed by PCP    Past Surgical History:  Procedure Laterality Date   AV FISTULA PLACEMENT  04/03/2012   Procedure: ARTERIOVENOUS (AV) FISTULA CREATION;  Surgeon: Sherren Kerns, MD;  Location: [facility] OR;  Service: Vascular;  Laterality: Left;  creation left brachial cephalic fistula    BIOPSY  08/21/2018   Procedure: BIOPSY;  Surgeon: Tressia Danas, MD;  Location: [facility] ENDOSCOPY;  Service: Gastroenterology;;   CARPAL TUNNEL RELEASE Bilateral    COLONOSCOPY W/ POLYPECTOMY     COLONOSCOPY WITH PROPOFOL N/A 08/21/2018   Procedure: COLONOSCOPY WITH PROPOFOL;  Surgeon: Tressia Danas, MD;  Location: [facility] ENDOSCOPY;  Service: Gastroenterology;  Laterality: N/A;   DILATION AND CURETTAGE OF UTERUS     ESOPHAGOGASTRODUODENOSCOPY (EGD) WITH PROPOFOL N/A 08/21/2018   Procedure: ESOPHAGOGASTRODUODENOSCOPY (EGD) WITH PROPOFOL;  Surgeon: Tressia Danas, MD;  Location: [facility] ENDOSCOPY;  Service: Gastroenterology;  Laterality: N/A;   EYE SURGERY Bilateral    bilateral cataract removal   HEMATOMA EVACUATION Left 05/14/2018   Procedure: Incision and Drainage of Left Arm Hematoma;  Surgeon: Sherren Kerns, MD;  Location: [facility] OR;  Service: Vascular;  Laterality: Left;   Hemodialysis  catheter Right    IR FLUORO GUIDE CV LINE RIGHT  05/26/2017   IR FLUORO GUIDE CV LINE RIGHT  04/10/2021   IR REMOVAL TUN CV CATH W/O FL  05/24/2017   IR REMOVAL TUN CV CATH W/O  FL  04/10/2021   IR US GUIDE VASC ACCESS RIGHT  05/26/2017   IR US GUIDE VASC ACCESS RIGHT  04/10/2021   LIGATION OF ARTERIOVENOUS  FISTULA Left 02/04/2015   Procedure: LIGATION OF BRACHIOCEPHALIC ARTERIOVENOUS  FISTULA;  Surgeon: Fransisco Hertz, MD;  Location: [facility] OR;  Service: Vascular;  Laterality: Left;   MULTIPLE TOOTH EXTRACTIONS     PARTIAL HIP ARTHROPLASTY     POLYPECTOMY  08/21/2018   Procedure: POLYPECTOMY;  Surgeon: Tressia Danas, MD;  Location: [facility] ENDOSCOPY;  Service: Gastroenterology;;   PORTACATH PLACEMENT     REVERSE  SHOULDER ARTHROPLASTY Left 01/22/2016   Procedure: LEFT REVERSE SHOULDER ARTHROPLASTY;  Surgeon: Beverely Low, MD;  Location: [facility] OR;  Service: Orthopedics;  Laterality: Left;   SHOULDER ARTHROSCOPY Bilateral    SPLIT NIGHT STUDY  07/26/2015   STERIOD INJECTION Right 01/22/2016   Procedure: RIGHT RING FINGER STEROID INJECTION;  Surgeon: Beverely Low, MD;  Location: [facility] OR;  Service: Orthopedics;  Laterality: Right;   TEE WITHOUT CARDIOVERSION N/A 05/26/2017   Procedure: TRANSESOPHAGEAL ECHOCARDIOGRAM (TEE);  Surgeon: Lewayne Bunting, MD;  Location: [facility] ENDOSCOPY;  Service: Cardiovascular;  Laterality: N/A;   THROMBECTOMY BRACHIAL ARTERY Left 02/06/2015   Procedure: EVACUATION OF LEFT ARM HEMATOMA;  Surgeon: Chuck Hint, MD;  Location: [facility] OR;  Service: Vascular;  Laterality: Left;   TOTAL KNEE ARTHROPLASTY Bilateral    TUBAL LIGATION      Current Outpatient Medications  Medication Sig Dispense Refill   acetaminophen (TYLENOL) 325 MG tablet Take 2 tablets (650 mg total) by mouth every 6 (six) hours as needed for mild pain, fever or headache.     albuterol (VENTOLIN HFA) 108 (90 Base) MCG/ACT inhaler TAKE 2 PUFFS BY MOUTH EVERY 6 HOURS AS NEEDED FOR WHEEZE OR SHORTNESS OF BREATH 8.5 each 1   amLODipine (NORVASC) 2.5 MG tablet TAKE 1 TABLET BY MOUTH EVERY DAY 90 tablet 1   aspirin 81 MG chewable tablet Chew by mouth.     AURYXIA 1 GM 210 MG(Fe) tablet Take 420 mg by mouth 3 (three) times daily with meals.     budesonide-formoterol (SYMBICORT) 80-4.5 MCG/ACT inhaler INHALE 2 PUFFS INTO THE LUNGS TWICE A DAY (NEEDS TO BE SEEN BEFORE NEXT REFILL) 10.2 each 0   docusate calcium (SURFAK) 240 MG capsule Take 1 capsule (240 mg total) by mouth daily. 90 capsule 1   doxercalciferol (HECTOROL) 4 MCG/2ML injection Inject 0.5 mLs (1 mcg total) into the vein every Monday, Wednesday, and Friday with hemodialysis. 2 mL 0   famotidine (PEPCID) 10 MG tablet Take 1 tablet (10 mg total) by mouth daily. 90  tablet 1   glucose blood (ACCU-CHEK AVIVA PLUS) test strip      levocetirizine (XYZAL) 5 MG tablet TAKE 1 TABLET BY MOUTH EVERY DAY IN THE EVENING 90 tablet 1   levothyroxine (SYNTHROID) 125 MCG tablet TAKE 1 TABLET BY MOUTH EVERY DAY 90 tablet 1   multivitamin (RENA-VIT) TABS tablet Take 1 tablet by mouth daily.     nystatin cream (MYCOSTATIN) Apply 1 Application topically 2 (two) times daily as needed for dry skin. 30 g 2   pantoprazole (PROTONIX) 40 MG tablet Take 1 tablet (40 mg total) by mouth daily. 30 tablet 5   Romosozumab-aqqg (EVENITY) 105 MG/1. SOSY injection Inject 210 mg into the skin every 30 (thirty) days. (INJECT 2 SYRINGES) 2.34 mL 10   sevelamer carbonate (RENVELA) 800 MG tablet Take by mouth.     sucralfate (CARAFATE) 1 g  tablet Take 1 tablet (1 g total) by mouth 2 (two) times daily. 60 tablet 1   Current Facility-Administered Medications  Medication Dose Route Frequency Provider Last Rate Last Admin   Romosozumab-aqqg (EVENITY) 105 MG/1. injection 210 mg  210 mg Subcutaneous Q30 days Sonny Masters, FNP   210 mg at 09/02/22 1021    Allergies as of 09/30/2022 - Review Complete 09/02/2022  Allergen Reaction Noted   Amoxicillin Rash 02/02/2011   Elemental sulfur Hives 01/28/2020   Penicillins Hives 11/25/2021   Sulfur Hives 01/28/2020   Chlorhexidine Itching and Dermatitis 06/22/2022   Peg [id number]-nacl-nasulf Other (See Comments) 01/28/2020   Sulfa drugs cross reactors Hives and Itching 02/02/2011   Miralax [polyethylene glycol] Itching 07/14/2020   Mixed grasses  08/04/2020   Percocet [oxycodone-acetaminophen] Itching and Rash 02/02/2011   Sulfa antibiotics Hives 09/05/2015    Family History  Problem Relation Age of Onset   Arthritis Mother    Diabetes Father        before age 36   Heart disease Father    Diabetes Sister    Dementia Sister    Cancer Sister        Unknown   Other Sister        Died in car accident   Cancer Brother    Stroke  Brother    Hyperlipidemia Daughter    Hypertension Daughter    Diabetes Daughter    Diabetes Daughter    Hypertension Daughter    Hyperlipidemia Daughter    Hypertension Son    Hyperlipidemia Son    Hepatitis C Son     Social History   Socioeconomic History   Marital status: Widowed    Spouse name: Not on file   Number of children: 6   Years of education: 10   Highest education level: 10th grade  Occupational History   Occupation: RETIRED  Tobacco Use   Smoking status: Former    Packs/day: 0.25    Years: 2.00    Additional pack years: 0.00    Total pack years: 0.50    Types: Cigarettes    Quit date: 04/04/1998    Years since quitting: 24.4   Smokeless tobacco: Never  Vaping Use   Vaping Use: Never used  Substance and Sexual Activity   Alcohol use: No    Alcohol/week: 0.0 standard drinks of alcohol   Drug use: No   Sexual activity: Not Currently    Birth control/protection: None  Other Topics Concern   Not on file  Social History Narrative   ** Merged History Encounter ** Widowed   6 children   Lives with son and daughter in Social [place]   Homemaker   ESRD - dialysis 3x per week   Cognitive impairment   Social Determinants of Health   Financial Resource Strain: Low Risk  (07/06/2022)   Overall Financial Resource Strain (CARDIA)    Difficulty of Paying Living Expenses: Not hard at all  Food Insecurity: No Food Insecurity (07/06/2022)   Hunger Vital Sign    Worried About Running Out of Food in the Last Year: Never true    Ran Out of Food in the Last Year: Never true  Transportation Needs: No Transportation Needs (07/06/2022)   PRAPARE - Administrator, Civil Service (Medical): No    Lack of Transportation (Non-Medical): No  Physical Activity: Unknown (07/06/2022)   Exercise Vital Sign    Days of Exercise per Week: 0 days    Minutes of Exercise  per Session: Not on file  Stress: No Stress Concern Present (07/06/2022)   [employer] of Occupational Health -  Occupational Stress Questionnaire    Feeling of Stress : Not at all  Social Connections: Socially Isolated (07/06/2022)   Social Connection and Isolation Panel [NHANES]    Frequency of Communication with Friends and Family: More than three times a week    Frequency of Social Gatherings with Friends and Family: Never    Attends Religious Services: Never    Database administrator or Organizations: No    Attends Engineer, structural: Not on file    Marital Status: Widowed    Review of Systems: Gen: Denies fever, chills, anorexia. Denies fatigue, weakness, weight loss.  CV: Denies chest pain, palpitations, syncope, peripheral edema, and claudication. Resp: Denies dyspnea at rest, cough, wheezing, coughing up blood, and pleurisy. GI: Denies vomiting blood, jaundice, and fecal incontinence.   Denies dysphagia or odynophagia. Derm: Denies rash, itching, dry skin Psych: Denies depression, anxiety, memory loss, confusion. No homicidal or suicidal ideation.  Heme: Denies bruising, bleeding, and enlarged lymph nodes.  Physical Exam: There were no vitals taken for this visit. General:   Alert and oriented. No distress noted. Pleasant and cooperative.  Head:  Normocephalic and atraumatic. Eyes:  Conjuctiva clear without scleral icterus. Heart:  S1, S2 present without murmurs appreciated. Lungs:  Clear to auscultation bilaterally. No wheezes, rales, or rhonchi. No distress.  Abdomen:  +BS, soft, non-tender and non-distended. No rebound or guarding. No HSM or masses noted. Msk:  Symmetrical without gross deformities. Normal posture. Extremities:  Without edema. Neurologic:  Alert and  oriented x4 Psych:  Normal mood and affect.    Assessment:     Plan:  ***   Ermalinda Memos, PA-C [facility] Gastroenterology 09/30/2022

## 2022-10-03 DEATH — deceased

## 2022-10-04 ENCOUNTER — Ambulatory Visit: Payer: 59 | Admitting: Family Medicine

## 2022-10-05 ENCOUNTER — Ambulatory Visit: Payer: 59 | Admitting: Family Medicine

## 2022-10-07 ENCOUNTER — Other Ambulatory Visit (HOSPITAL_COMMUNITY): Payer: 59

## 2022-10-28 ENCOUNTER — Other Ambulatory Visit (HOSPITAL_COMMUNITY): Payer: 59

## 2022-12-02 ENCOUNTER — Ambulatory Visit: Payer: 59 | Admitting: Podiatry
